# Patient Record
Sex: Female | Born: 1965 | Race: White | Hispanic: No | Marital: Single | Smoking: Current every day smoker
Health system: Southern US, Community
[De-identification: ages and names within clinical notes are randomized; demographics above are authoritative.]

## PROBLEM LIST (undated history)

## (undated) ENCOUNTER — Emergency Department (HOSPITAL_COMMUNITY): Source: Home / Self Care

## (undated) DIAGNOSIS — J449 Chronic obstructive pulmonary disease, unspecified: Secondary | ICD-10-CM

## (undated) DIAGNOSIS — T50905A Adverse effect of unspecified drugs, medicaments and biological substances, initial encounter: Secondary | ICD-10-CM

## (undated) DIAGNOSIS — Z8044 Family history of malignant neoplasm of fallopian tube(s): Secondary | ICD-10-CM

## (undated) DIAGNOSIS — Z8051 Family history of malignant neoplasm of kidney: Secondary | ICD-10-CM

## (undated) DIAGNOSIS — M549 Dorsalgia, unspecified: Secondary | ICD-10-CM

## (undated) DIAGNOSIS — K8021 Calculus of gallbladder without cholecystitis with obstruction: Secondary | ICD-10-CM

## (undated) DIAGNOSIS — F411 Generalized anxiety disorder: Secondary | ICD-10-CM

## (undated) DIAGNOSIS — I10 Essential (primary) hypertension: Secondary | ICD-10-CM

## (undated) DIAGNOSIS — G8929 Other chronic pain: Secondary | ICD-10-CM

## (undated) DIAGNOSIS — Z9981 Dependence on supplemental oxygen: Secondary | ICD-10-CM

## (undated) DIAGNOSIS — R0602 Shortness of breath: Secondary | ICD-10-CM

## (undated) DIAGNOSIS — G43909 Migraine, unspecified, not intractable, without status migrainosus: Secondary | ICD-10-CM

## (undated) DIAGNOSIS — Z801 Family history of malignant neoplasm of trachea, bronchus and lung: Secondary | ICD-10-CM

## (undated) DIAGNOSIS — J80 Acute respiratory distress syndrome: Secondary | ICD-10-CM

## (undated) DIAGNOSIS — K219 Gastro-esophageal reflux disease without esophagitis: Secondary | ICD-10-CM

## (undated) DIAGNOSIS — J069 Acute upper respiratory infection, unspecified: Secondary | ICD-10-CM

## (undated) DIAGNOSIS — I209 Angina pectoris, unspecified: Secondary | ICD-10-CM

## (undated) DIAGNOSIS — J189 Pneumonia, unspecified organism: Secondary | ICD-10-CM

## (undated) DIAGNOSIS — R739 Hyperglycemia, unspecified: Secondary | ICD-10-CM

## (undated) DIAGNOSIS — Z923 Personal history of irradiation: Secondary | ICD-10-CM

## (undated) HISTORY — DX: Family history of malignant neoplasm of trachea, bronchus and lung: Z80.1

## (undated) HISTORY — DX: Family history of malignant neoplasm of kidney: Z80.51

## (undated) HISTORY — PX: TUBAL LIGATION: SHX77

## (undated) HISTORY — PX: OTHER SURGICAL HISTORY: SHX169

## (undated) HISTORY — DX: Family history of malignant neoplasm of fallopian tube(s): Z80.44

## (undated) HISTORY — DX: Gastro-esophageal reflux disease without esophagitis: K21.9

## (undated) HISTORY — PX: TRACHEOSTOMY: SUR1362

---

## 2004-03-14 ENCOUNTER — Ambulatory Visit (HOSPITAL_COMMUNITY): Admission: RE | Admit: 2004-03-14 | Discharge: 2004-03-14 | Payer: Self-pay | Admitting: Family Medicine

## 2004-04-10 ENCOUNTER — Ambulatory Visit (HOSPITAL_COMMUNITY): Admission: RE | Admit: 2004-04-10 | Discharge: 2004-04-10 | Payer: Self-pay | Admitting: Family Medicine

## 2004-04-25 ENCOUNTER — Encounter (HOSPITAL_COMMUNITY): Admission: RE | Admit: 2004-04-25 | Discharge: 2004-05-25 | Payer: Self-pay | Admitting: Family Medicine

## 2004-05-29 DIAGNOSIS — M542 Cervicalgia: Secondary | ICD-10-CM | POA: Insufficient documentation

## 2005-04-19 IMAGING — CT CT HEAD W/O CM
1 series · 16 of 26 positions shown, 20 images · non-contrast
Comparison: none

CLINICAL DATA: Chronic headache almost daily for four months.  Nausea.  Dizziness. 
 CT SCAN OF THE HEAD WITHOUT CONTRAST BUT WITH BONE WINDOWS 
 A series of scans of the entire head are made without contrast and without previous films for comparison and show no evidence of intracranial mass or hemorrhage.  The ventricular system is normal.  There is no shift of the midline structures.  Bone windows show the paranasal sinuses, base of the skull, internal auditory canals and the bony calvarium to be intact. 
 IMPRESSION
 No significant abnormality CT scan of the head without contrast.

[Series 1505: — · axial · 0.45mm/px · z∈[-626,-511]mm · 16 of 26 slices shown, 20 images]
[im 2/26  brain]
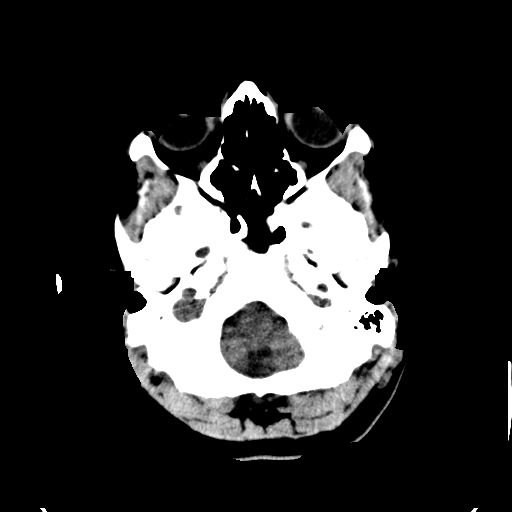
[im 2/26  bone]
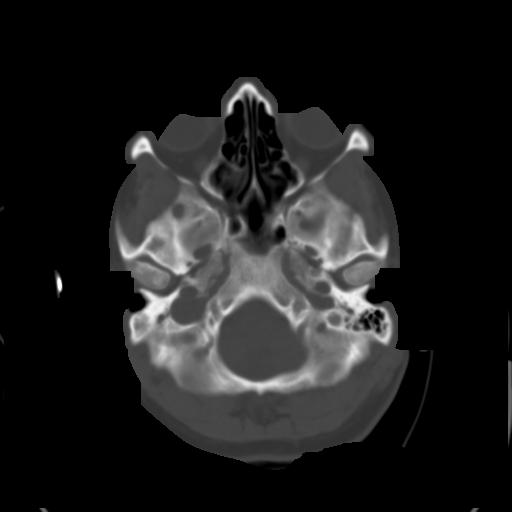
[im 4/26  brain]
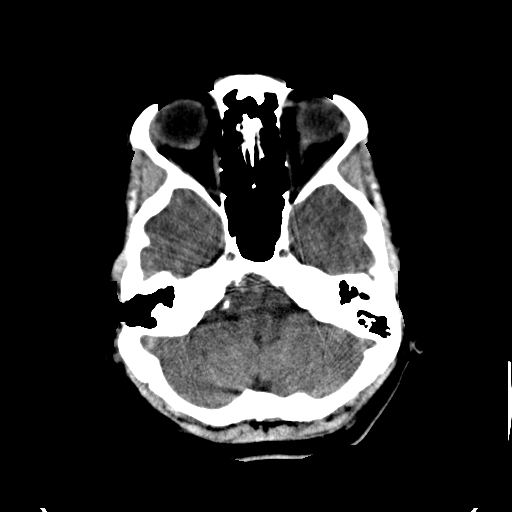
[im 5/26  brain]
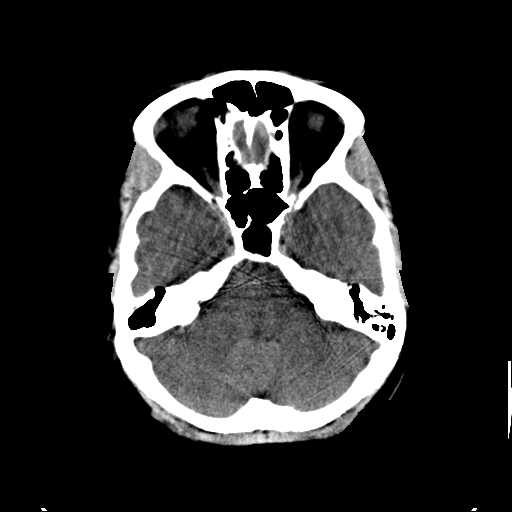
[im 7/26  brain]
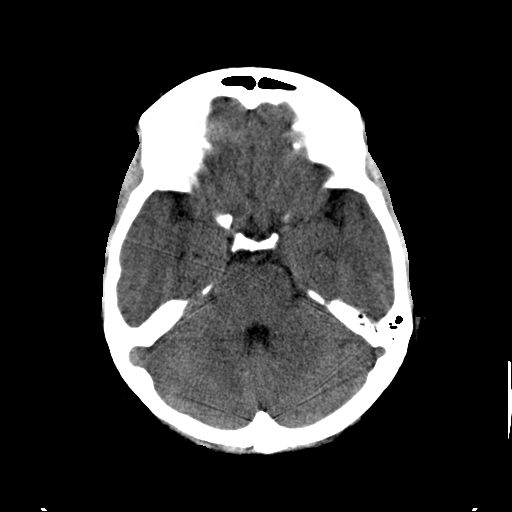
[im 8/26  brain]
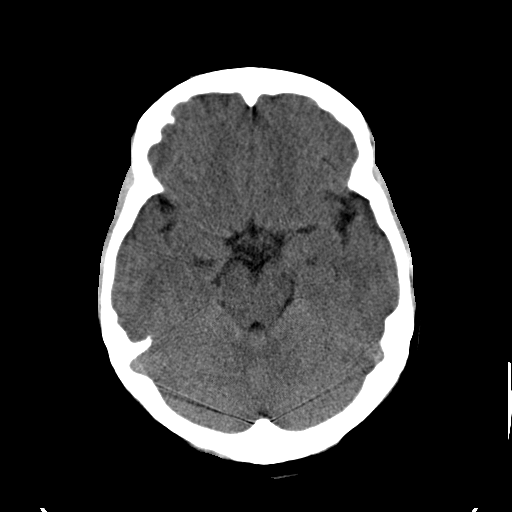
[im 8/26  bone]
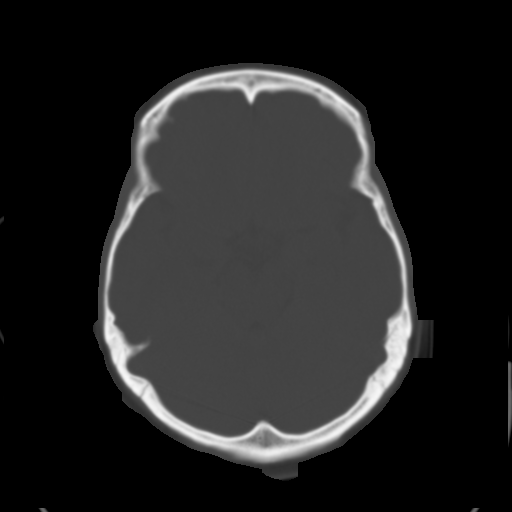
[im 10/26  brain]
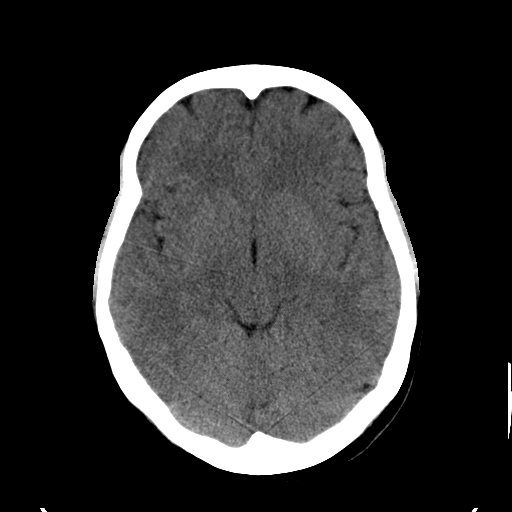
[im 11/26  brain]
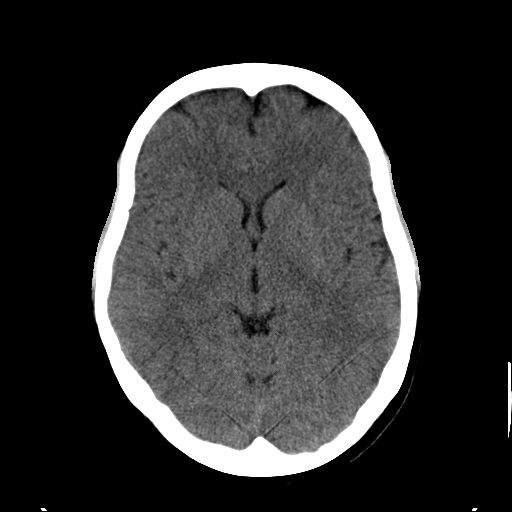
[im 13/26  brain]
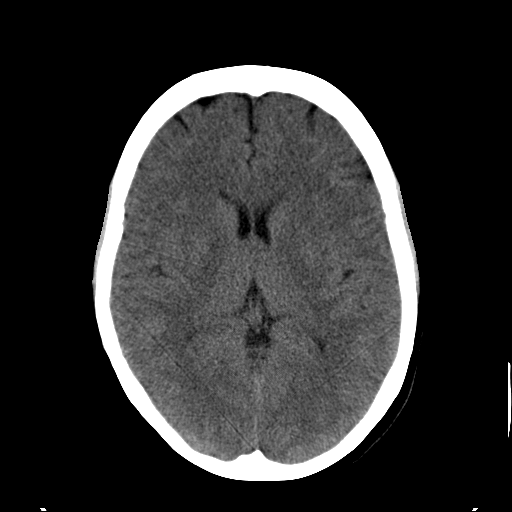
[im 14/26  brain]
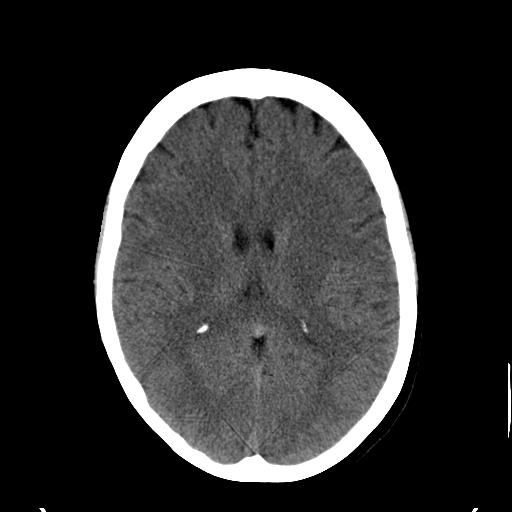
[im 14/26  bone]
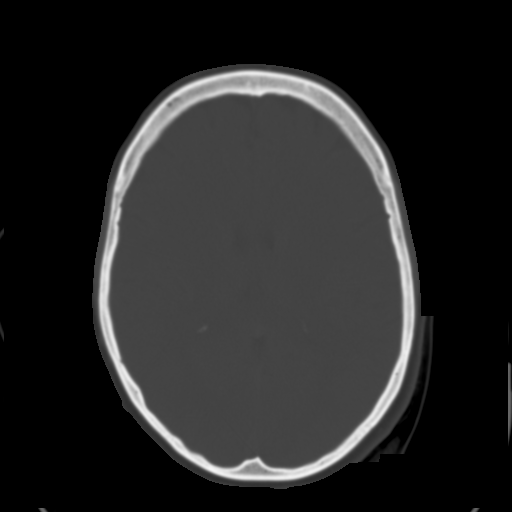
[im 16/26  brain]
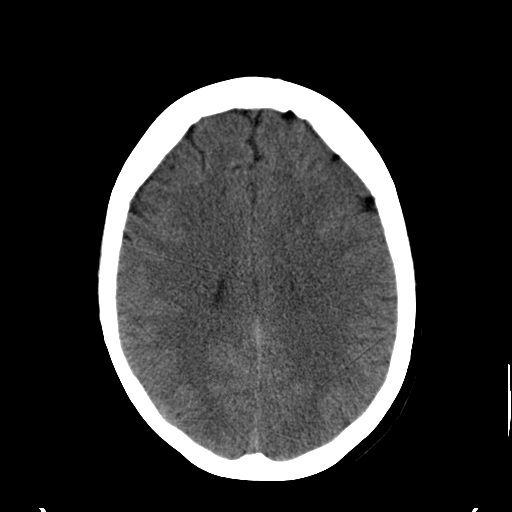
[im 17/26  brain]
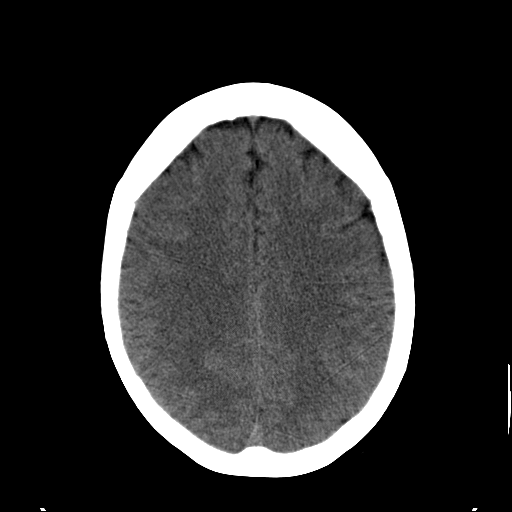
[im 19/26  brain]
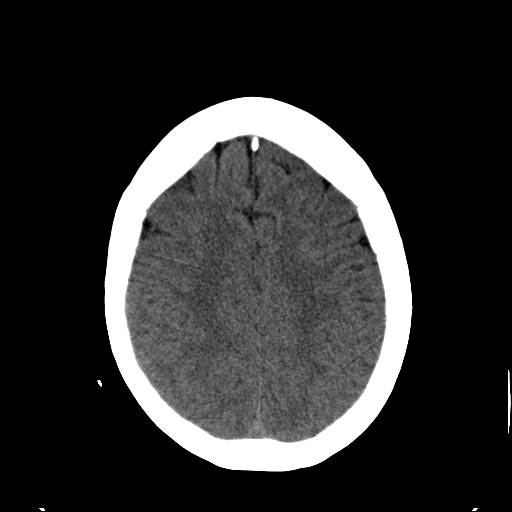
[im 20/26  brain]
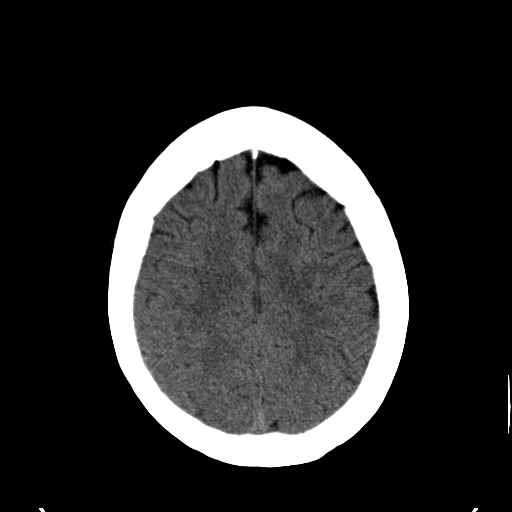
[im 20/26  bone]
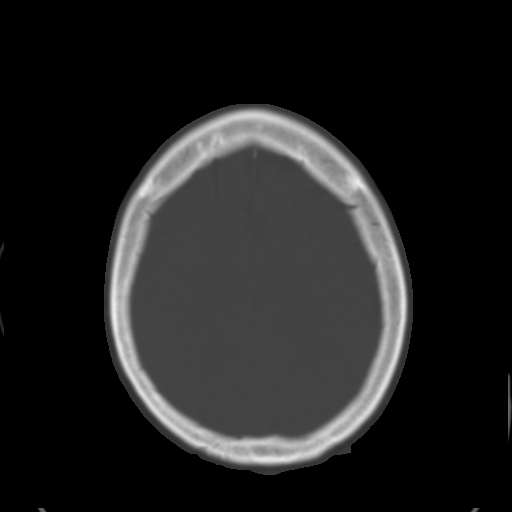
[im 22/26  brain]
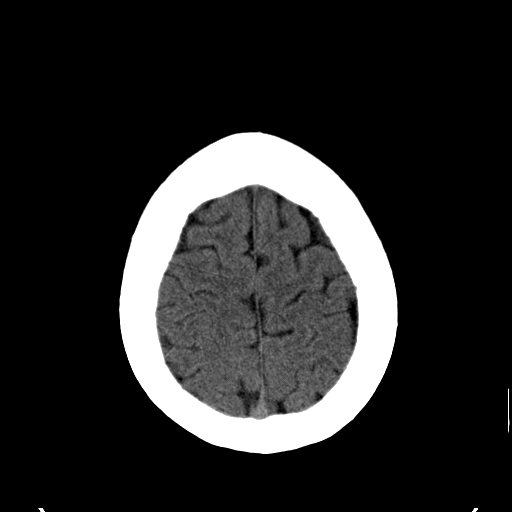
[im 23/26  brain]
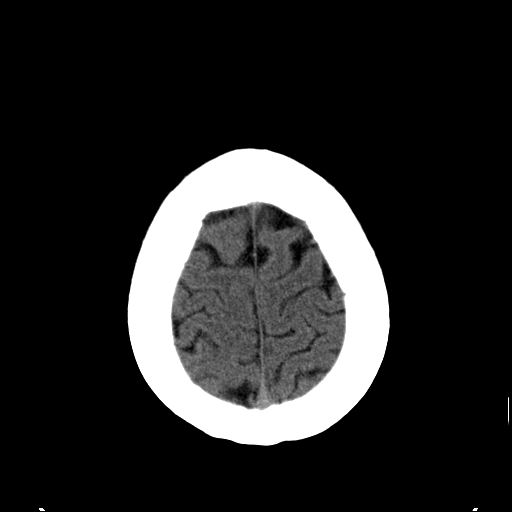
[im 25/26  brain]
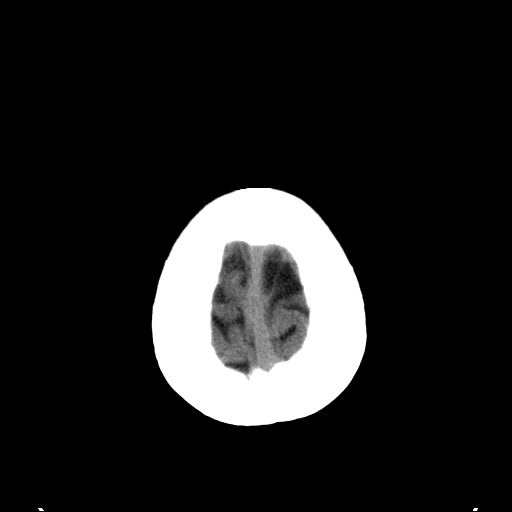

[16 of 26 positions shown; findings below may reference images not displayed]

## 2005-05-16 IMAGING — MR MR LUMBAR SPINE W/O CM
4 of 6 series · 13 of 48 positions shown · non-contrast
Comparison: none

CLINICAL DATA: Low back and bilateral leg pain, greater on the left.  The patient also has leg weakness and paresthesias.  
 MRI LUMBAR SPINE WITHOUT CONTRAST
 IMPRESSION
 Disk degeneration and moderate sized central disk herniation at the L4-5 level.  This is causing an anterior indentation on the thecal sac and is in contact with the right L5 nerve root as it begins to exit the thecal sac.  No nerve root compression seen in the supine position.

[Series 3: T2 · sagittal · 4.0mm · 0.67mm/px · 4 of 12 slices shown (1 of 2)]
[im 1/12]
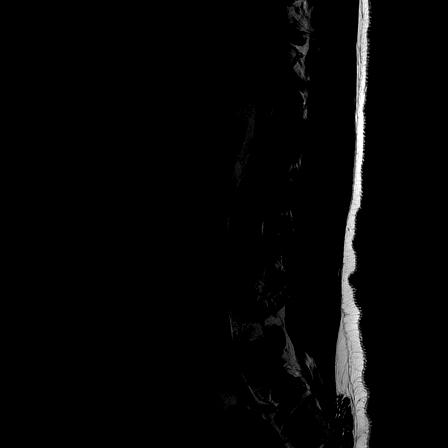
[im 3/12]
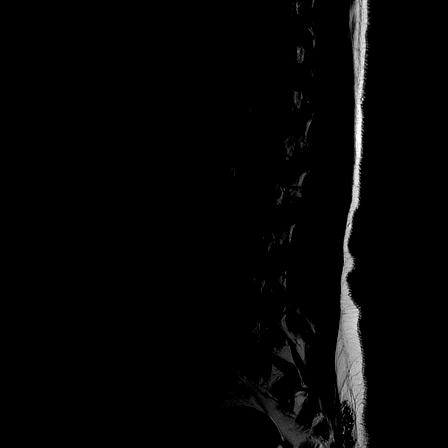
[im 7/12]
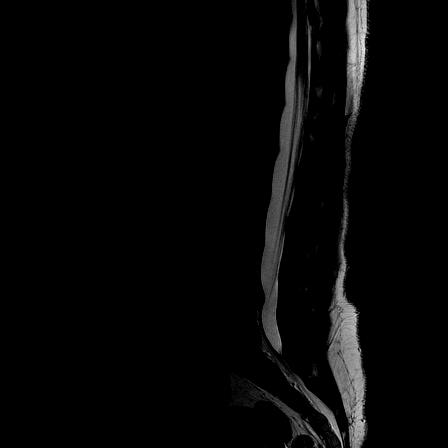
[im 12/12]
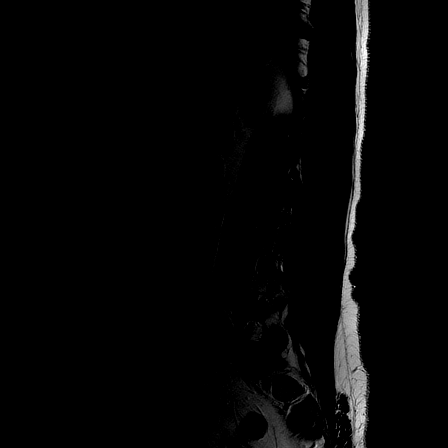

[Series 4: T1 · sagittal · 4.0mm · 0.39mm/px · 3 of 12 slices shown]
[im 3/12]
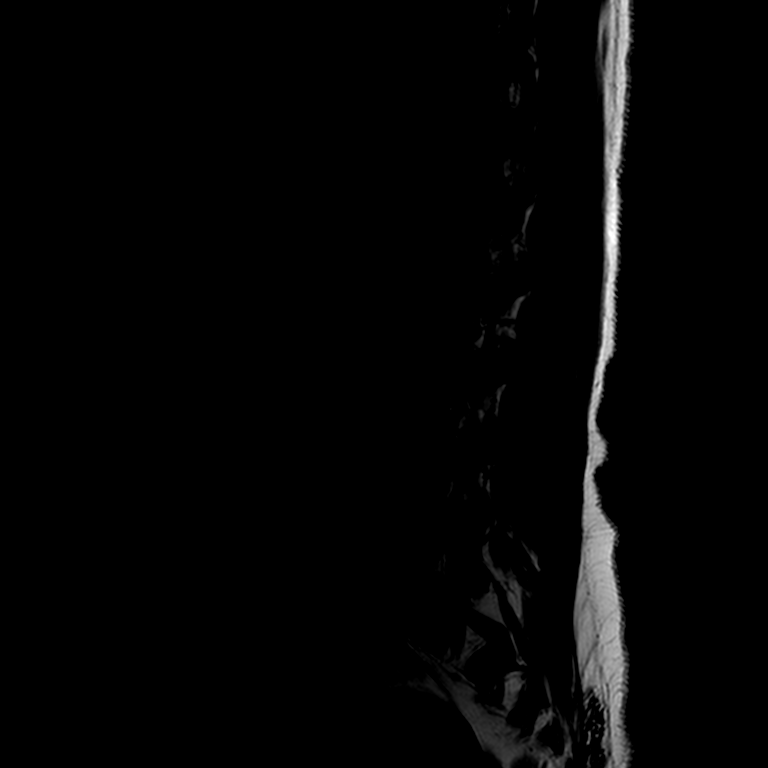
[im 7/12]
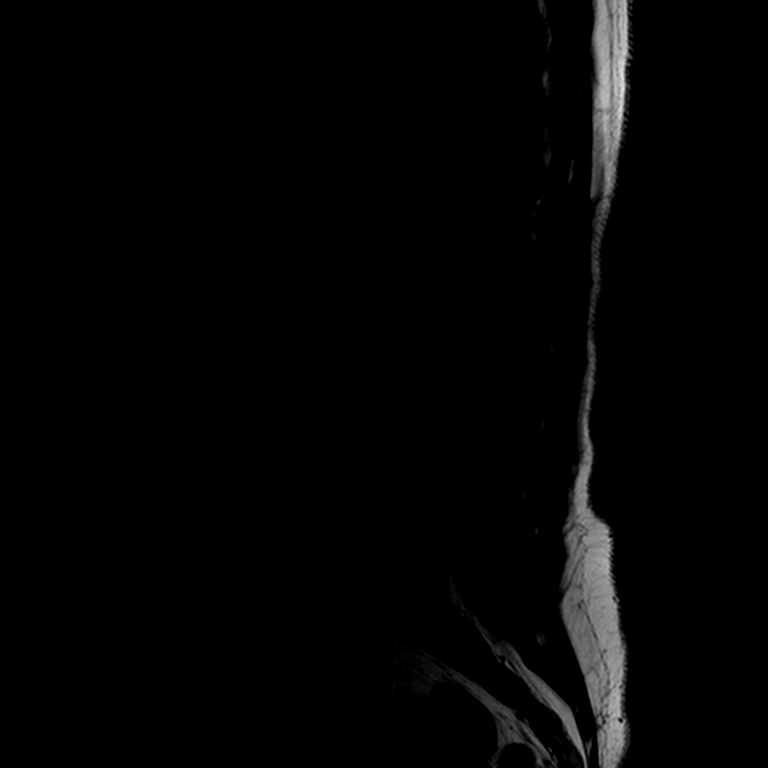
[im 12/12]
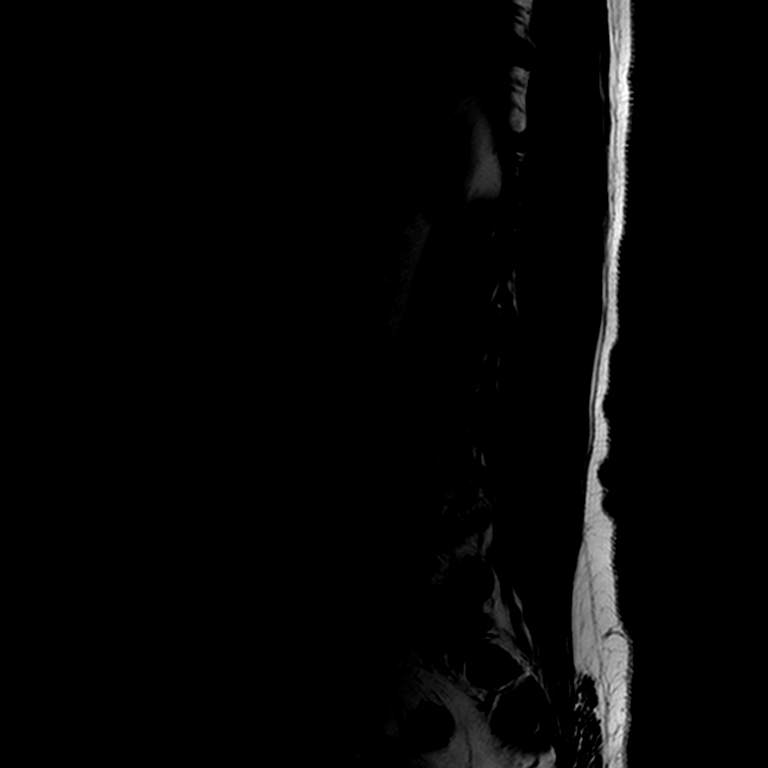

[Series 5: PD · sagittal · 4.0mm · 0.42mm/px · 3 of 12 slices shown]
[im 3/12]
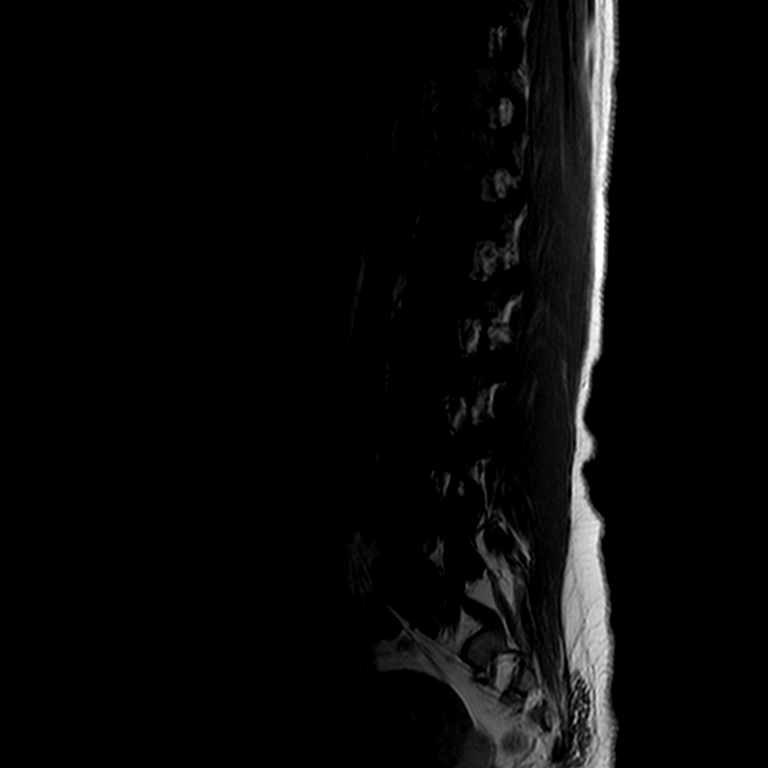
[im 7/12]
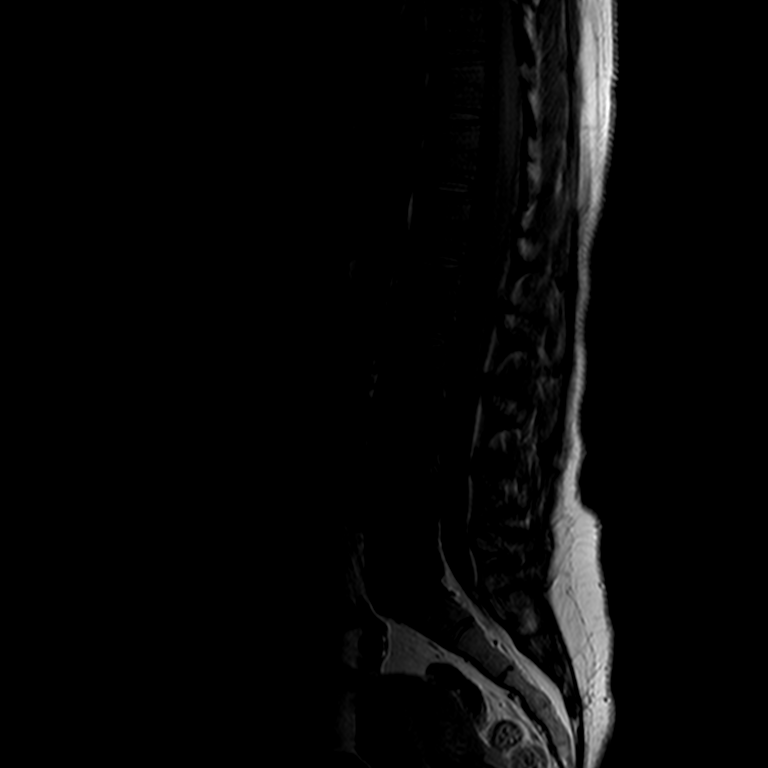
[im 12/12]
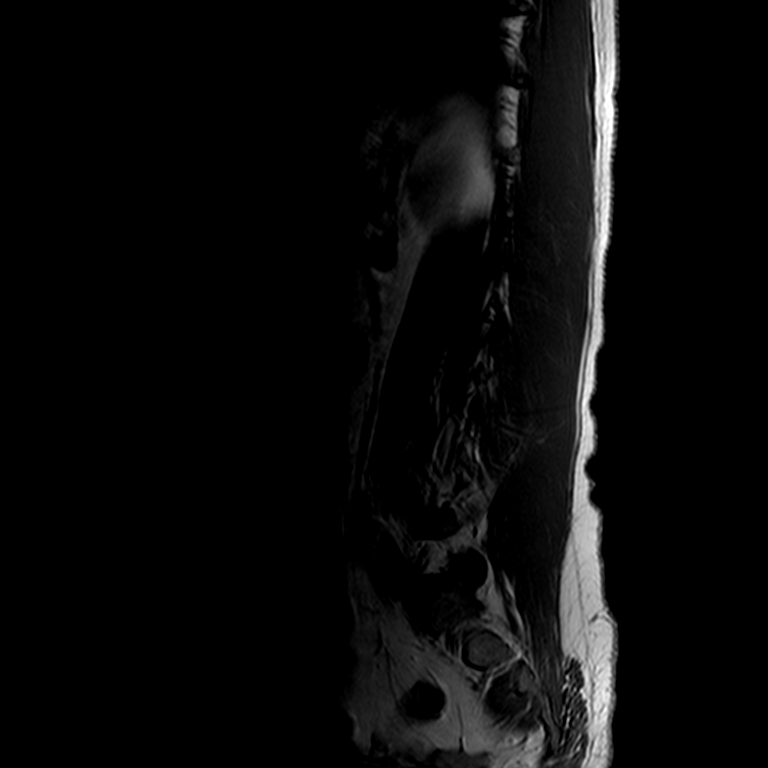

[Series 7: T2 · axial · 4.0mm · 0.21mm/px · z∈[-111,+23]mm · 3 of 25 slices shown (2 of 2)]
[im 5/25]
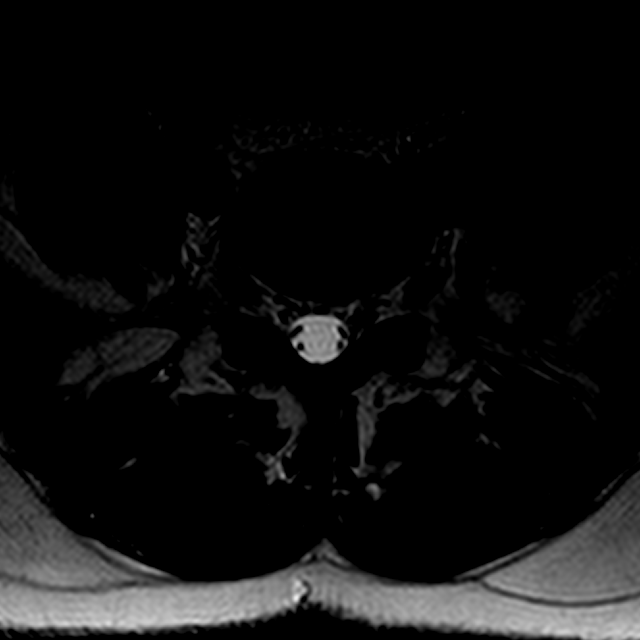
[im 14/25]
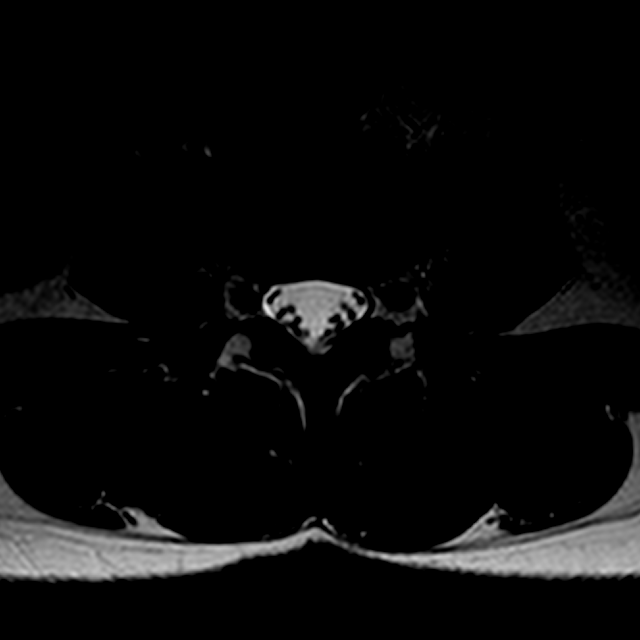
[im 22/25]
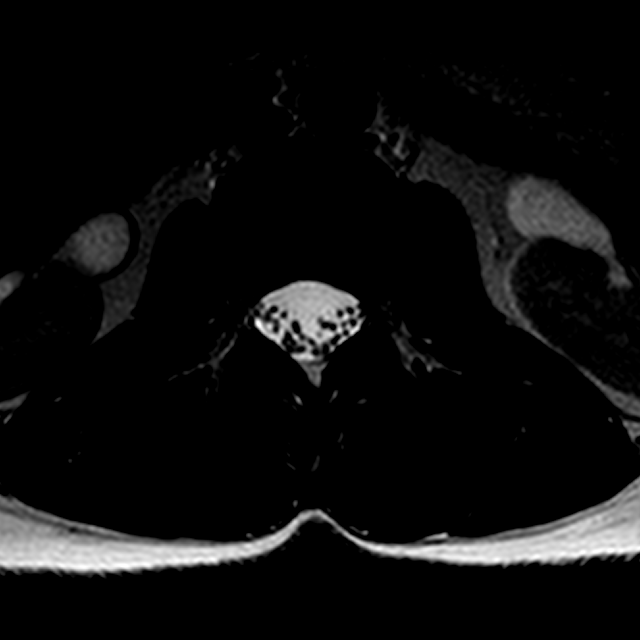

[13 of 48 positions shown; findings below may reference images not displayed]

## 2006-06-04 ENCOUNTER — Emergency Department (HOSPITAL_COMMUNITY): Admission: EM | Admit: 2006-06-04 | Discharge: 2006-06-04 | Payer: Self-pay | Admitting: Emergency Medicine

## 2006-06-13 ENCOUNTER — Emergency Department (HOSPITAL_COMMUNITY): Admission: EM | Admit: 2006-06-13 | Discharge: 2006-06-13 | Payer: Self-pay | Admitting: Emergency Medicine

## 2006-07-19 ENCOUNTER — Emergency Department (HOSPITAL_COMMUNITY): Admission: EM | Admit: 2006-07-19 | Discharge: 2006-07-19 | Payer: Self-pay | Admitting: Emergency Medicine

## 2006-07-25 ENCOUNTER — Emergency Department (HOSPITAL_COMMUNITY): Admission: EM | Admit: 2006-07-25 | Discharge: 2006-07-25 | Payer: Self-pay | Admitting: Emergency Medicine

## 2007-08-30 IMAGING — CT CT ABDOMEN W/O CM
2 of 4 series · 16 of 46 positions shown, 18 images · IV contrast (agent unspecified)
Comparison: none

CLINICAL DATA: Abdominal pain, evaluate for stone.
ABDOMEN CT WITHOUT CONTRAST:
TECHNIQUE: Multidetector CT imaging of the abdomen was performed following the standard protocol without IV contrast.
TECHNIQUE: Multidetector CT imaging of the pelvis was performed following the standard protocol without IV contrast.

[Series 2: stone_wo 5.0 b40f st · axial · 0.61mm/px · z∈[-448,-112]mm · 13 of 92 slices shown, 15 images]
[im 4/92  soft-tissue]
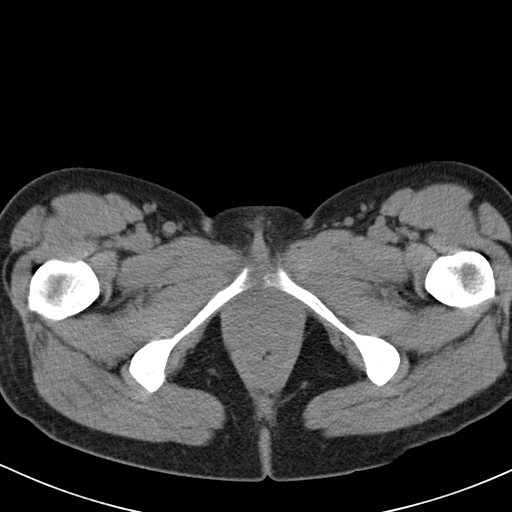
[im 4/92  bone]
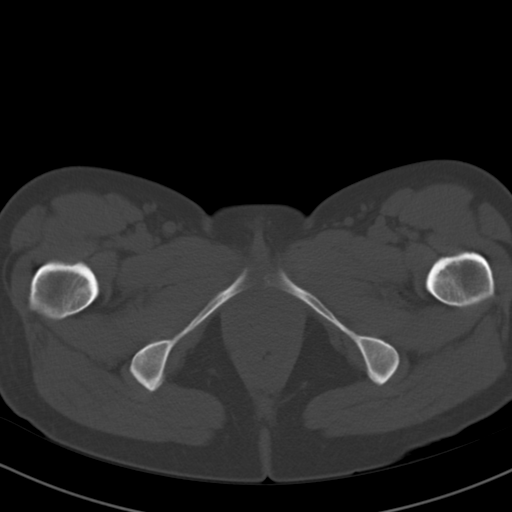
[im 11/92  soft-tissue]
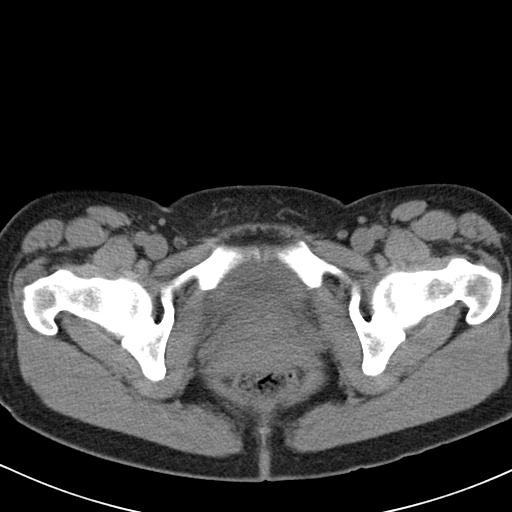
[im 19/92  soft-tissue]
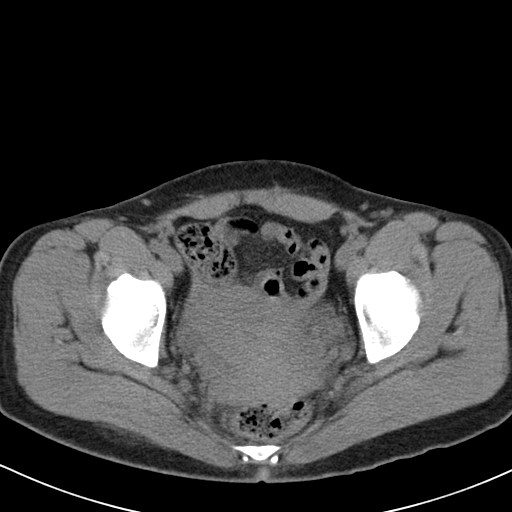
[im 26/92  soft-tissue]
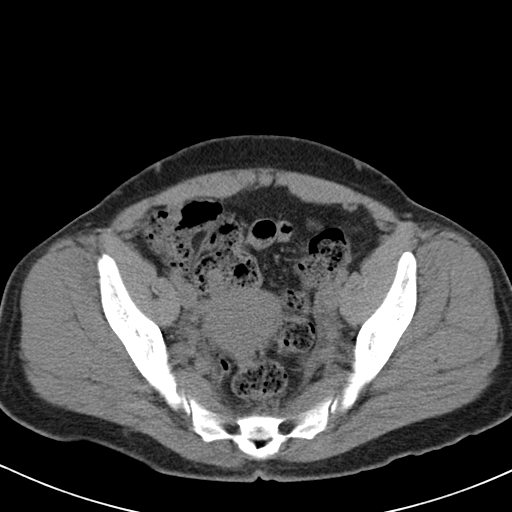
[im 33/92  soft-tissue]
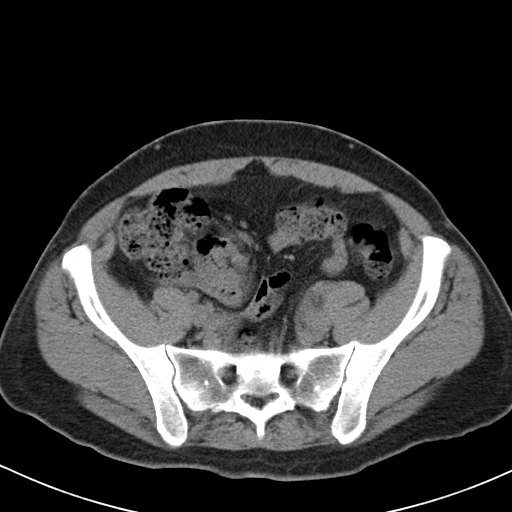
[im 41/92  soft-tissue]
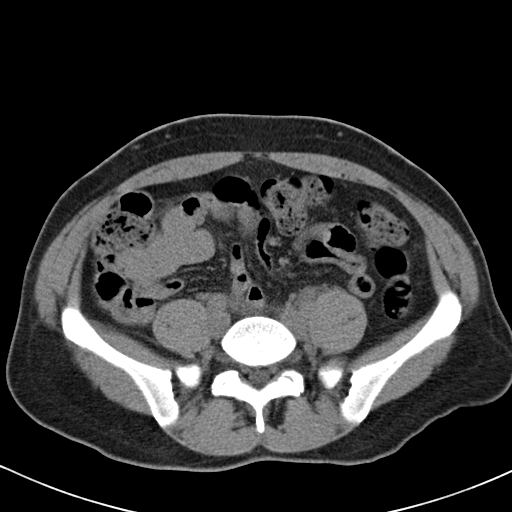
[im 48/92  soft-tissue]
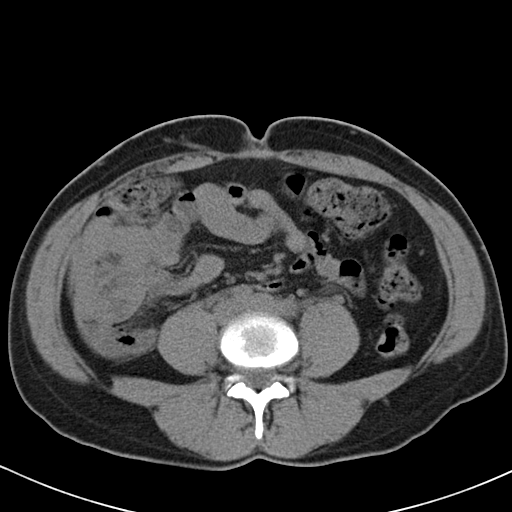
[im 51/92  soft-tissue]
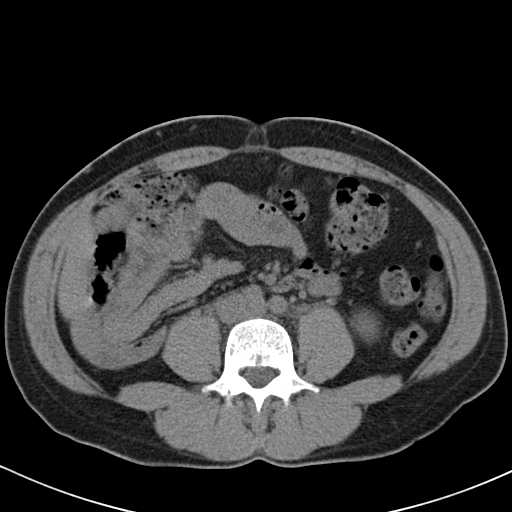
[im 59/92  soft-tissue]
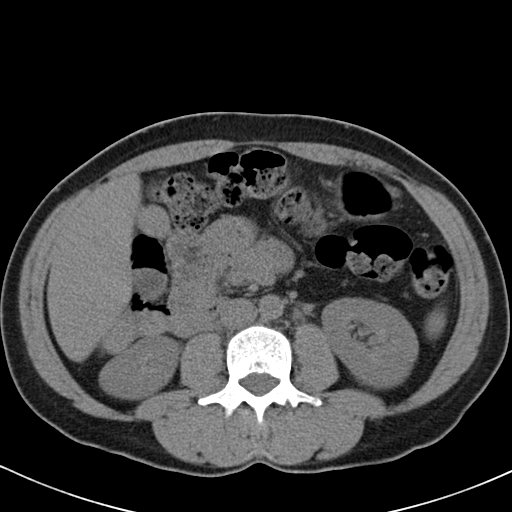
[im 59/92  bone]
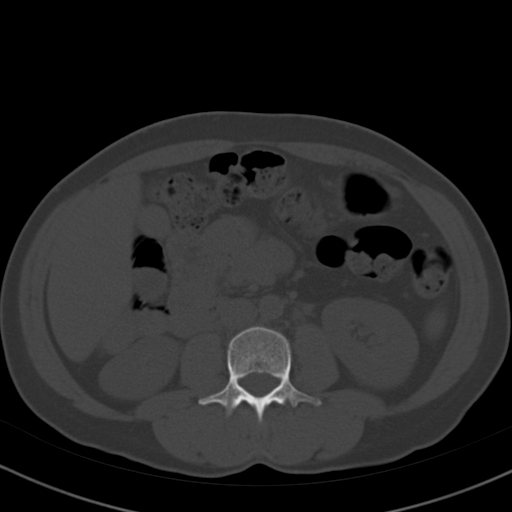
[im 66/92  soft-tissue]
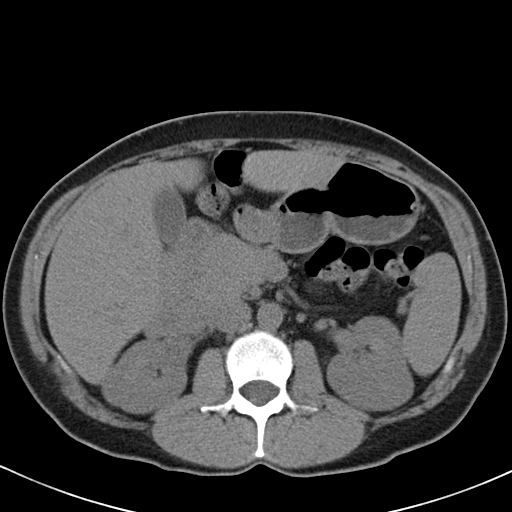
[im 73/92  soft-tissue]
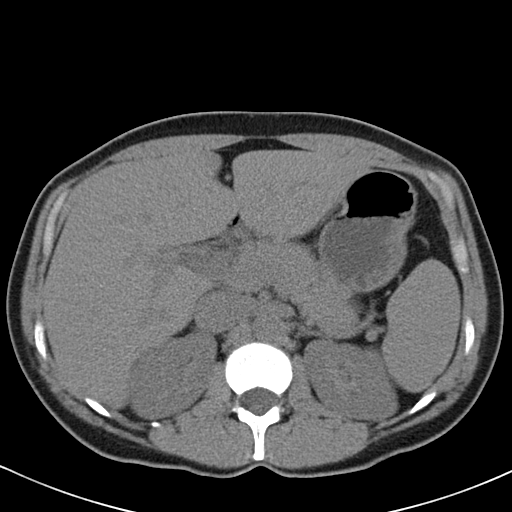
[im 81/92  soft-tissue]
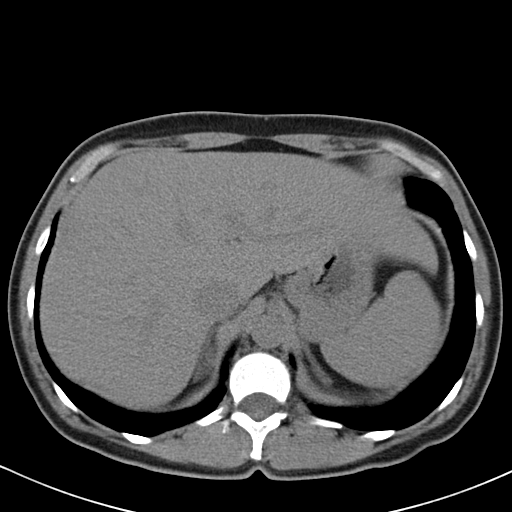
[im 88/92  soft-tissue]
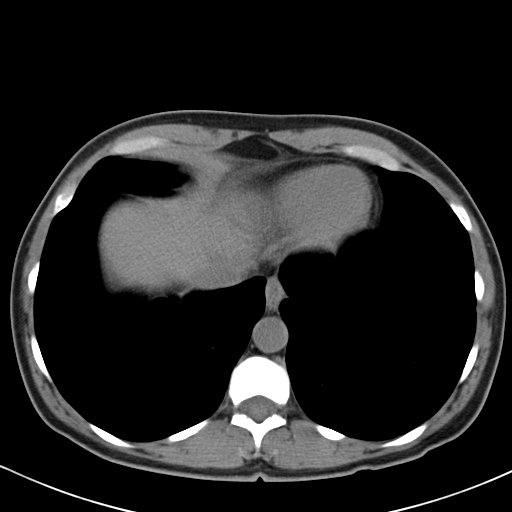

[Series 602: coronal abdomen · coronal · 0.75mm/px · 3 of 108 slices shown]
[im 36/108  soft-tissue]
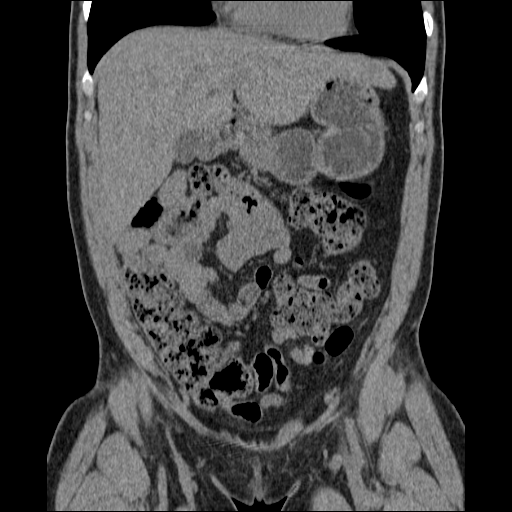
[im 48/108  soft-tissue]
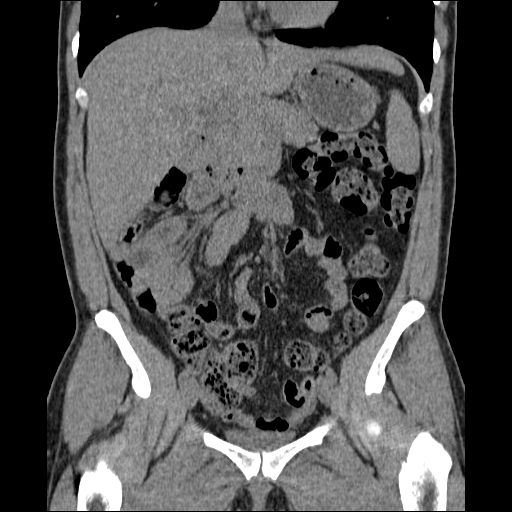
[im 60/108  soft-tissue]
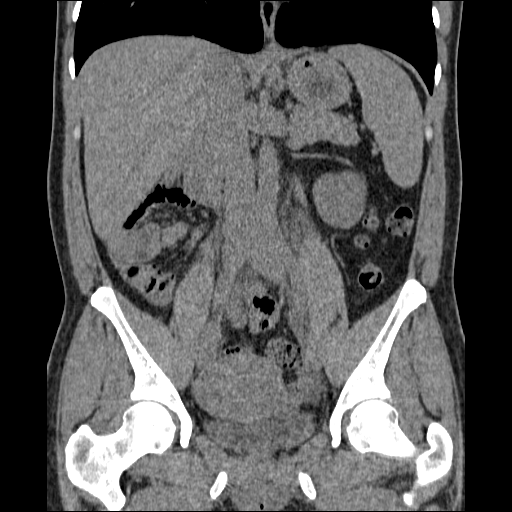

[16 of 46 positions shown; findings below may reference images not displayed]

FINDINGS: Evaluation of abdominal structures in inherently limited without IV contrast.    There is a 4 to 5 mm subpleural density along the left costophrenic angle on image 14 that likely represents a scar or subpleural lymph node.  There is an additional punctate pleural peripheral density on image 5 in the left lower lobe that may be calcified.  The remainder of the lung bases are clear.  Negative for free air.  Liver, spleen, gallbladder, and stomach are within normal limits.  Soft tissue near the porta hepatis and pancreatic head is indeterminate but probably represents pancreas and adjacent bowel structures.   is difficult to evaluate without intravenous or oral contrast.  The adrenal glands and right kidney are within normal limits.  Mild dilatation of the left ureter with surrounding stranding along the course of the ureter.  However, there is not a ureteral stone or obstructing lesion appreciated.  There appears to be extensive inflammatory change where the left iliac vessels and left ureter cross.  There appears to be a small amount of free fluid which could be physiologic in a patient of this age.  No acute bony abnormalities.
IMPRESSION: Stranding around the left ureter.  No evidence for an obstructing stone.  Findings could represent an underlying mucosal lesion versus a recently passed stone.  This area may be better evaluated with contrast enhanced study or IVP.  
PELVIS CT WITHOUT CONTRAST:
FINDINGS: The urinary bladder, uterus, and adnexal structures are grossly normal.  A small amount of free fluid is likely physiologic in nature.  No acute bony abnormalities.
IMPRESSION: 1.  Small amount of free fluid that is indeterminate but could be physiologic in nature.
2.  No evidence for ureteral or bladder stones.

## 2008-02-13 ENCOUNTER — Inpatient Hospital Stay (HOSPITAL_COMMUNITY): Admission: AD | Admit: 2008-02-13 | Discharge: 2008-02-17 | Payer: Self-pay | Admitting: Psychiatry

## 2008-02-13 ENCOUNTER — Ambulatory Visit: Payer: Self-pay | Admitting: Psychiatry

## 2009-02-24 ENCOUNTER — Emergency Department (HOSPITAL_COMMUNITY): Admission: EM | Admit: 2009-02-24 | Discharge: 2009-02-24 | Payer: Self-pay | Admitting: Emergency Medicine

## 2009-07-27 ENCOUNTER — Inpatient Hospital Stay (HOSPITAL_COMMUNITY): Admission: EM | Admit: 2009-07-27 | Discharge: 2009-07-29 | Payer: Self-pay | Admitting: Emergency Medicine

## 2009-08-03 ENCOUNTER — Inpatient Hospital Stay (HOSPITAL_COMMUNITY): Admission: EM | Admit: 2009-08-03 | Discharge: 2009-08-05 | Payer: Self-pay | Admitting: Emergency Medicine

## 2009-08-10 ENCOUNTER — Emergency Department (HOSPITAL_COMMUNITY): Admission: EM | Admit: 2009-08-10 | Discharge: 2009-08-10 | Payer: Self-pay | Admitting: Emergency Medicine

## 2009-08-12 ENCOUNTER — Emergency Department (HOSPITAL_COMMUNITY): Admission: EM | Admit: 2009-08-12 | Discharge: 2009-08-12 | Payer: Self-pay | Admitting: Emergency Medicine

## 2009-11-26 ENCOUNTER — Ambulatory Visit: Payer: Self-pay | Admitting: Critical Care Medicine

## 2009-11-26 ENCOUNTER — Ambulatory Visit: Payer: Self-pay | Admitting: Internal Medicine

## 2009-11-26 ENCOUNTER — Inpatient Hospital Stay (HOSPITAL_COMMUNITY): Admission: EM | Admit: 2009-11-26 | Discharge: 2010-01-15 | Payer: Self-pay | Admitting: Emergency Medicine

## 2009-11-28 ENCOUNTER — Ambulatory Visit: Payer: Self-pay | Admitting: Infectious Diseases

## 2009-11-28 ENCOUNTER — Encounter: Payer: Self-pay | Admitting: Critical Care Medicine

## 2009-12-24 ENCOUNTER — Ambulatory Visit: Payer: Self-pay | Admitting: Critical Care Medicine

## 2010-01-03 ENCOUNTER — Ambulatory Visit: Payer: Self-pay | Admitting: Physical Medicine & Rehabilitation

## 2010-01-28 ENCOUNTER — Emergency Department (HOSPITAL_COMMUNITY): Admission: EM | Admit: 2010-01-28 | Discharge: 2010-01-28 | Payer: Self-pay | Admitting: Emergency Medicine

## 2010-02-09 ENCOUNTER — Emergency Department (HOSPITAL_COMMUNITY): Admission: EM | Admit: 2010-02-09 | Discharge: 2010-02-09 | Payer: Self-pay | Admitting: Emergency Medicine

## 2010-02-13 DIAGNOSIS — E639 Nutritional deficiency, unspecified: Secondary | ICD-10-CM | POA: Insufficient documentation

## 2010-02-13 DIAGNOSIS — J984 Other disorders of lung: Secondary | ICD-10-CM

## 2010-02-13 DIAGNOSIS — R404 Transient alteration of awareness: Secondary | ICD-10-CM | POA: Insufficient documentation

## 2010-02-14 ENCOUNTER — Ambulatory Visit: Payer: Self-pay | Admitting: Emergency Medicine

## 2010-02-14 DIAGNOSIS — F331 Major depressive disorder, recurrent, moderate: Secondary | ICD-10-CM | POA: Insufficient documentation

## 2010-02-14 DIAGNOSIS — F1910 Other psychoactive substance abuse, uncomplicated: Secondary | ICD-10-CM | POA: Insufficient documentation

## 2010-02-14 DIAGNOSIS — J449 Chronic obstructive pulmonary disease, unspecified: Secondary | ICD-10-CM | POA: Insufficient documentation

## 2010-02-20 ENCOUNTER — Telehealth: Payer: Self-pay | Admitting: Emergency Medicine

## 2010-02-25 ENCOUNTER — Telehealth (INDEPENDENT_AMBULATORY_CARE_PROVIDER_SITE_OTHER): Payer: Self-pay | Admitting: *Deleted

## 2010-04-01 IMAGING — CR DG CHEST 2V
2 series · 2 of 2 positions shown · non-contrast
Comparison: None available.

CLINICAL DATA: Shortness of breath.

CHEST - 2 VIEW

[w chest pa]
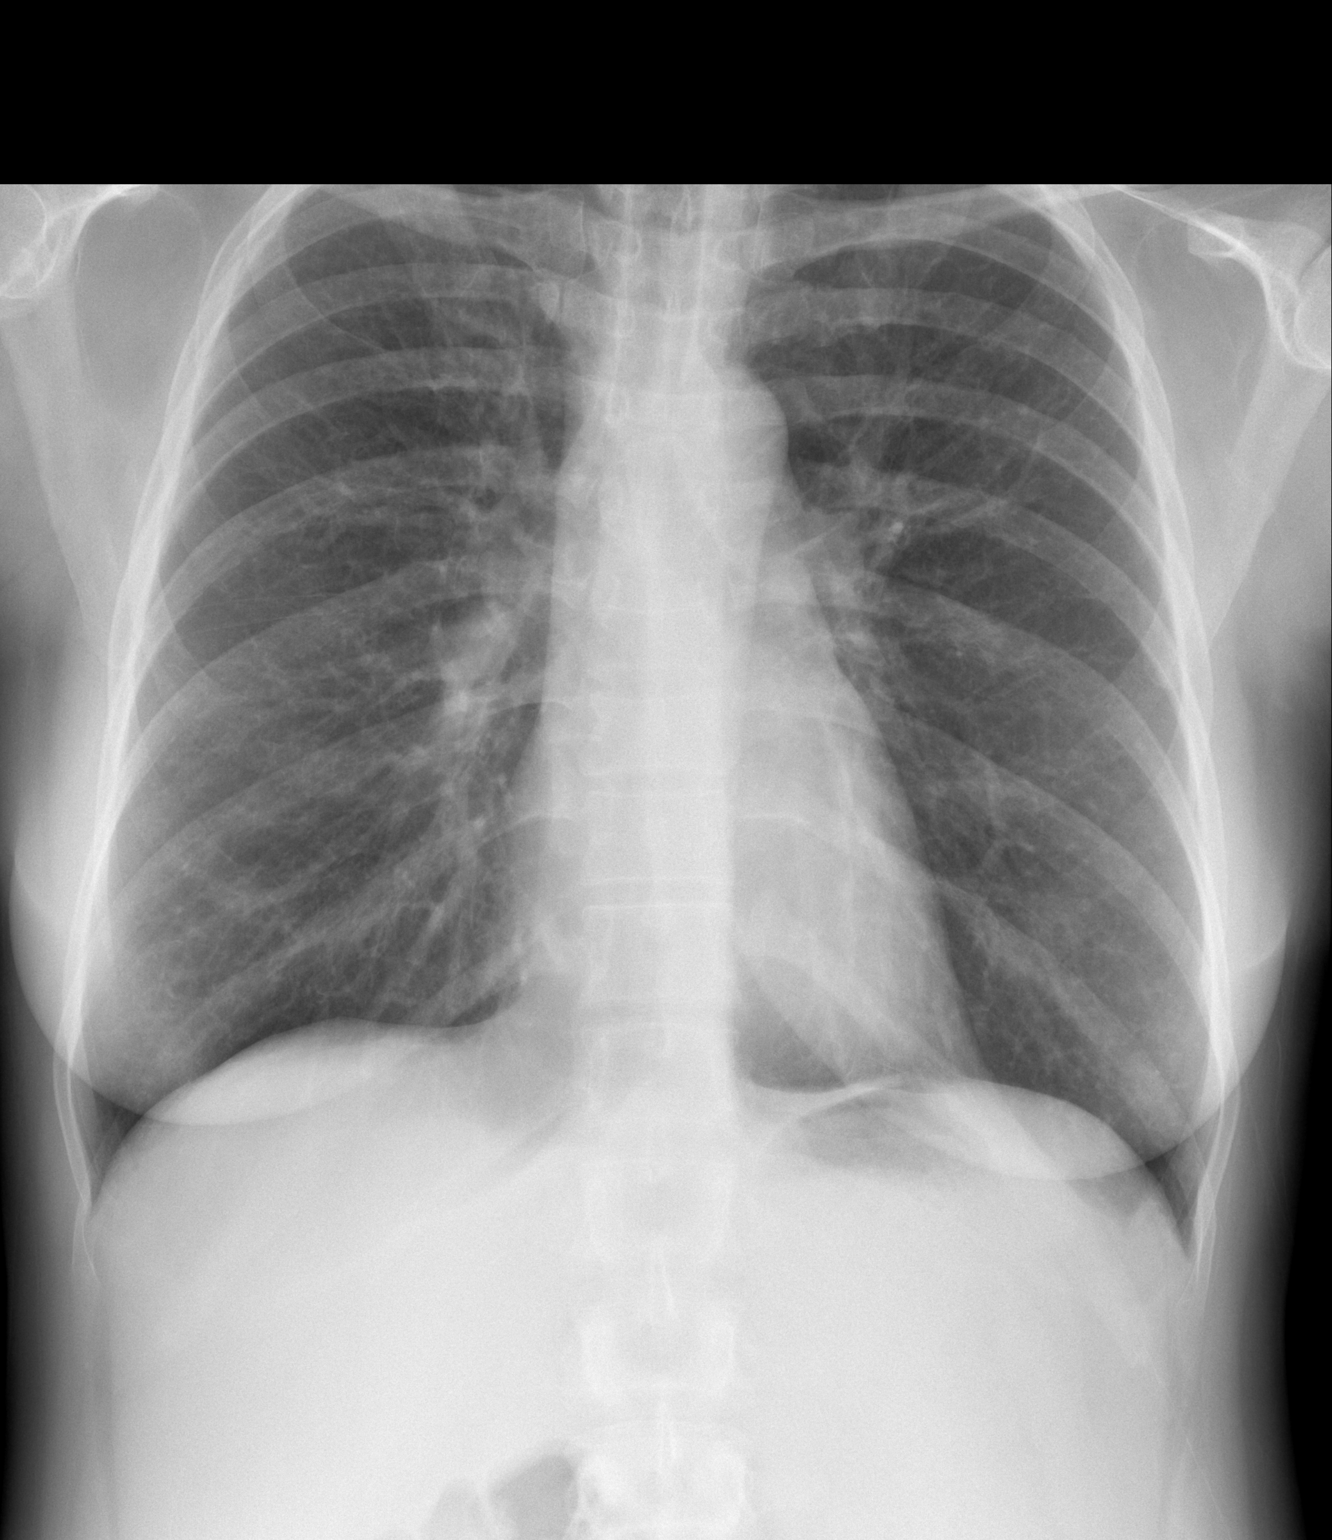

[w chest lat]
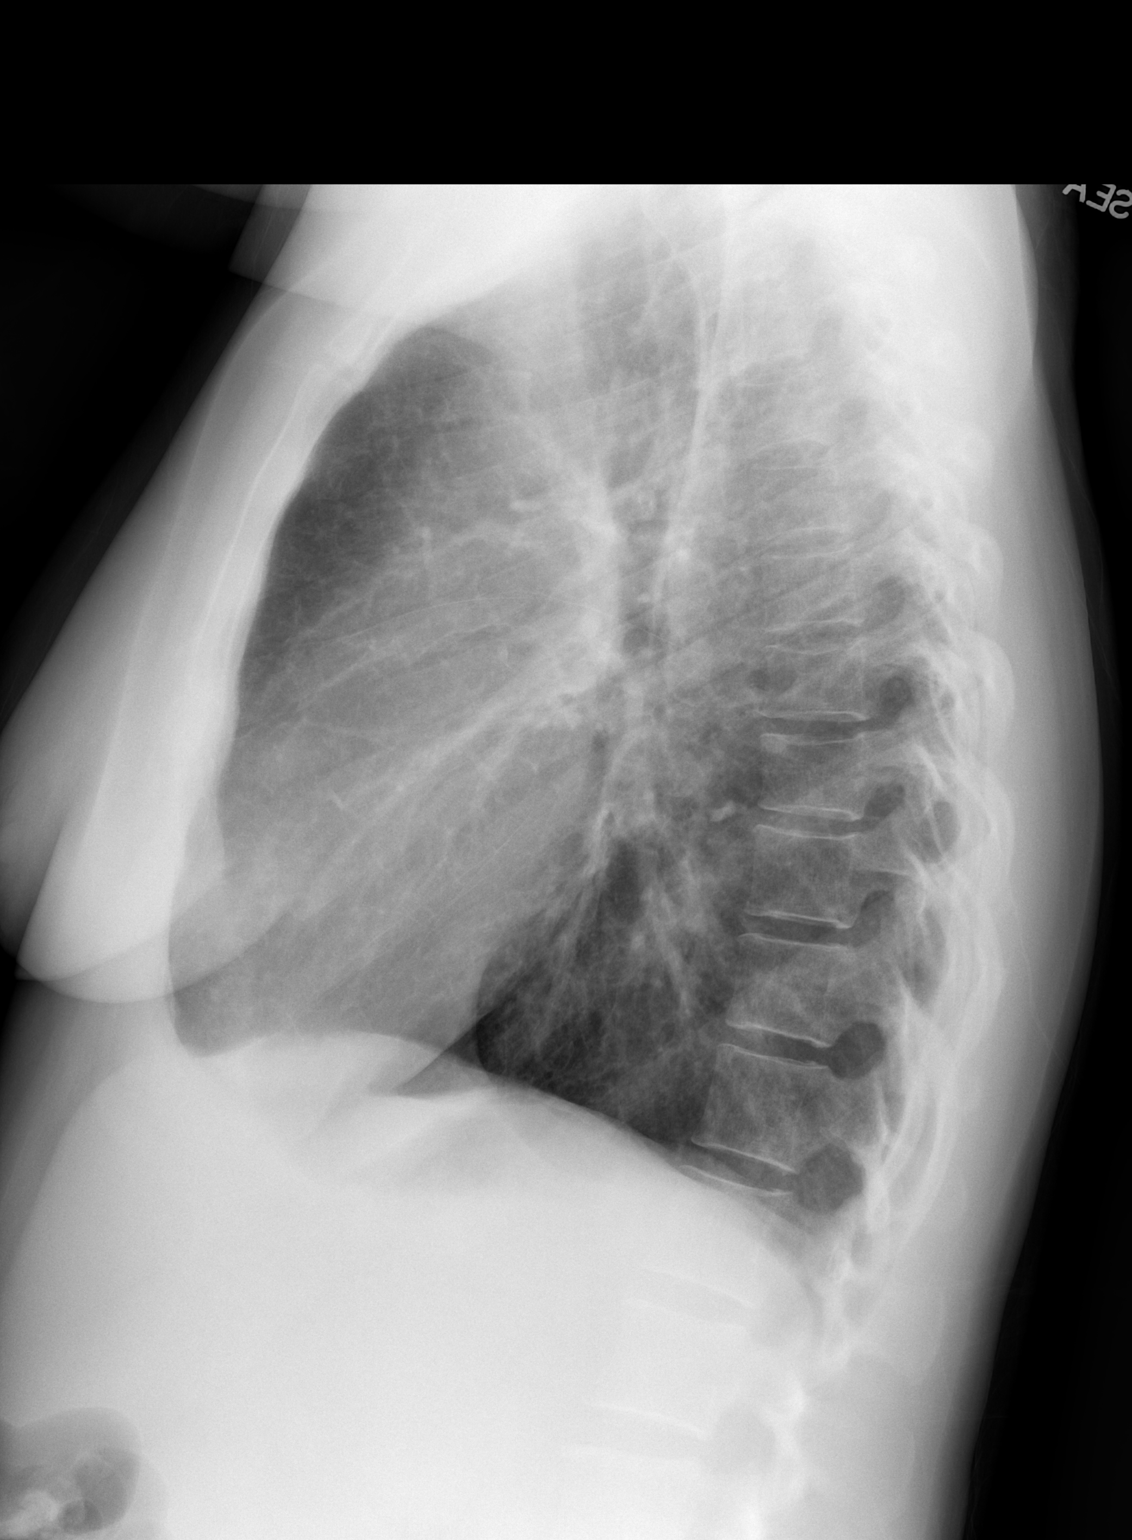

[2 of 2 positions shown; findings below may reference images not displayed]

FINDINGS: Lungs clear. Heart size normal. No pleural effusion.
IMPRESSION: No acute disease.

## 2010-04-11 ENCOUNTER — Ambulatory Visit (HOSPITAL_COMMUNITY): Admission: RE | Admit: 2010-04-11 | Discharge: 2010-04-11 | Payer: Self-pay | Admitting: Family Medicine

## 2010-04-16 ENCOUNTER — Ambulatory Visit: Payer: Self-pay | Admitting: Emergency Medicine

## 2010-05-01 ENCOUNTER — Emergency Department (HOSPITAL_COMMUNITY): Admission: EM | Admit: 2010-05-01 | Discharge: 2010-05-01 | Payer: Self-pay | Admitting: Emergency Medicine

## 2010-05-02 ENCOUNTER — Telehealth: Payer: Self-pay | Admitting: Emergency Medicine

## 2010-07-22 ENCOUNTER — Ambulatory Visit: Payer: Self-pay | Admitting: Emergency Medicine

## 2010-08-01 ENCOUNTER — Telehealth (INDEPENDENT_AMBULATORY_CARE_PROVIDER_SITE_OTHER): Payer: Self-pay | Admitting: *Deleted

## 2010-08-07 ENCOUNTER — Telehealth (INDEPENDENT_AMBULATORY_CARE_PROVIDER_SITE_OTHER): Payer: Self-pay | Admitting: *Deleted

## 2010-08-17 ENCOUNTER — Emergency Department (HOSPITAL_COMMUNITY): Admission: EM | Admit: 2010-08-17 | Discharge: 2010-08-17 | Payer: Self-pay | Admitting: Emergency Medicine

## 2010-08-21 ENCOUNTER — Ambulatory Visit (HOSPITAL_COMMUNITY): Admission: RE | Admit: 2010-08-21 | Discharge: 2010-08-21 | Payer: Self-pay | Admitting: Neurology

## 2010-08-23 ENCOUNTER — Telehealth: Payer: Self-pay | Admitting: Pulmonary Disease

## 2010-09-01 ENCOUNTER — Ambulatory Visit: Payer: Self-pay | Admitting: Emergency Medicine

## 2010-09-01 ENCOUNTER — Emergency Department (HOSPITAL_COMMUNITY): Admission: EM | Admit: 2010-09-01 | Discharge: 2010-09-01 | Payer: Self-pay | Admitting: Emergency Medicine

## 2010-09-01 IMAGING — CR DG CHEST 1V PORT
1 series · 1 of 1 positions shown · non-contrast
Comparison: 02/24/2009

CLINICAL DATA: Shortness of breath, chest pain, smoker

PORTABLE CHEST - 1 VIEW

[view not recorded]
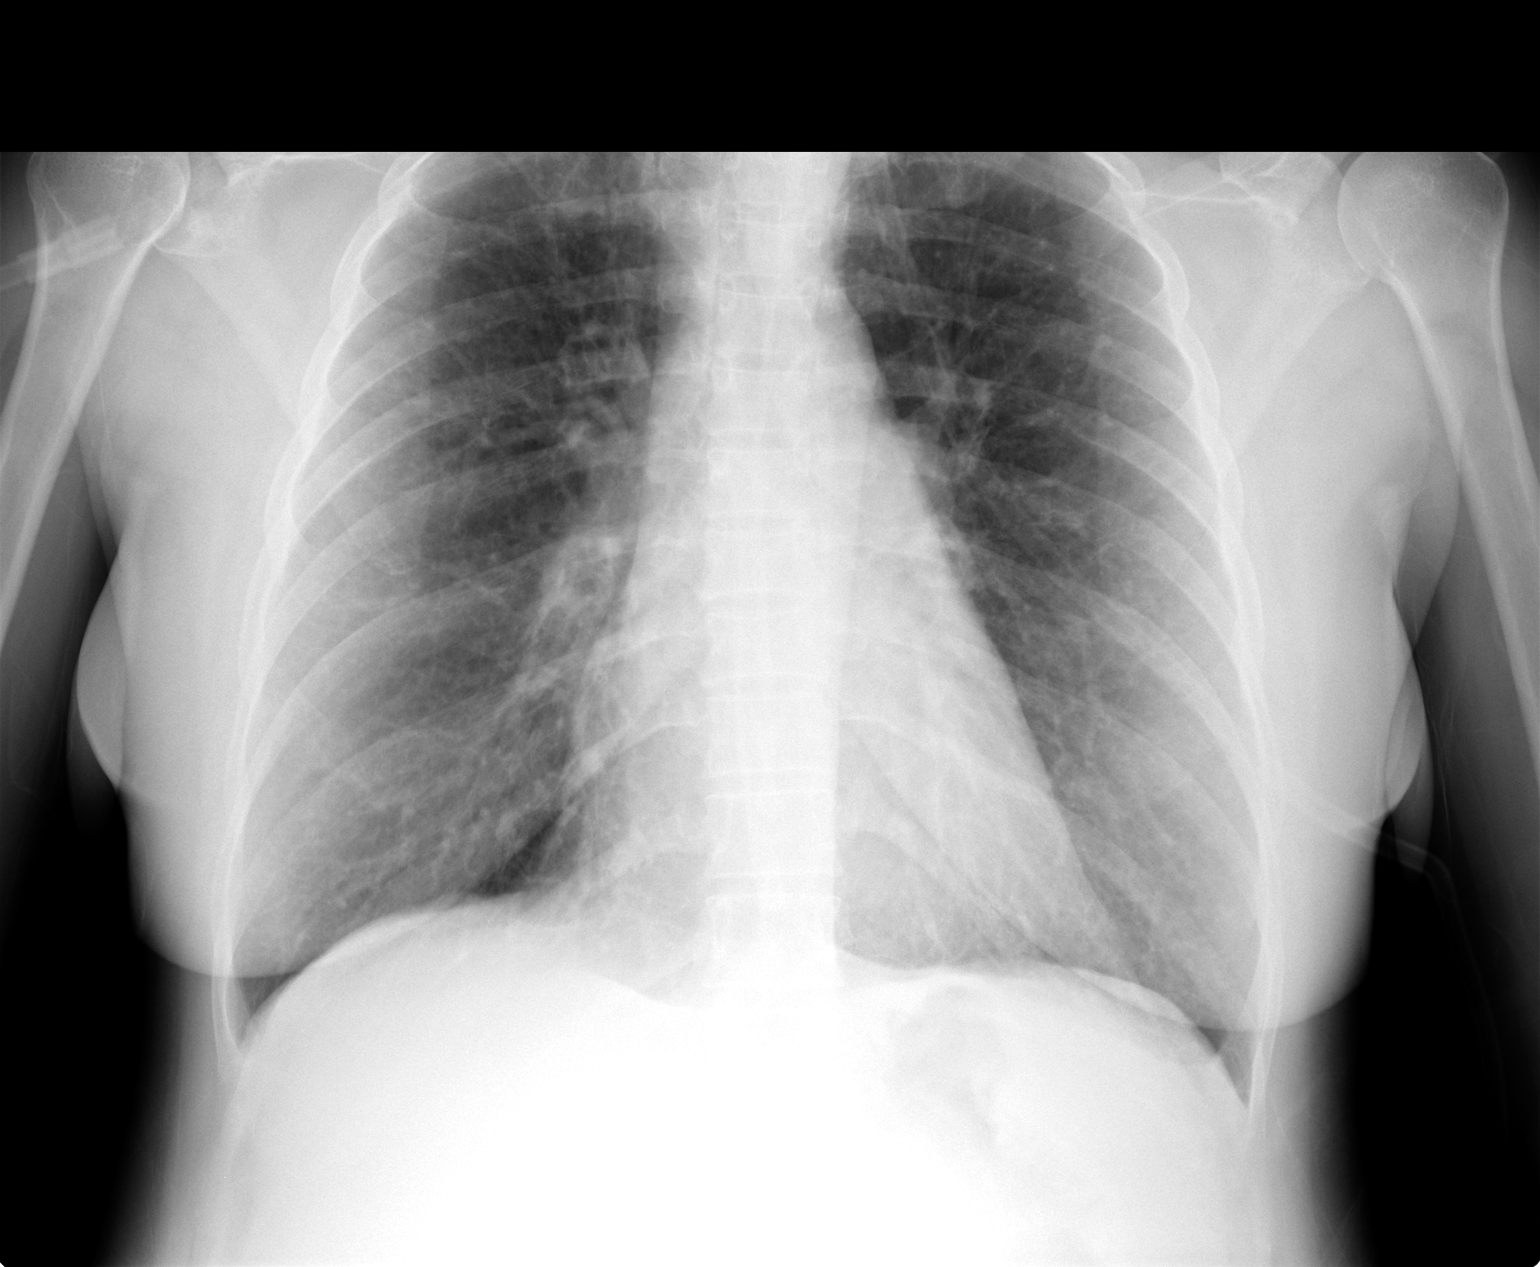

[1 of 1 positions shown; findings below may reference images not displayed]

FINDINGS: Mild bronchial thickening and interstitial prominence
diffusely.  Bronchitis not excluded.  Normal heart size and
vascularity.  No focal pneumonia, edema, effusion or pneumothorax.
Midline trachea.  Intact bony thorax.
IMPRESSION: Bronchial thickening and interstitial prominence.

## 2010-09-08 IMAGING — CR DG CHEST 1V PORT
1 series · 1 of 1 positions shown · non-contrast
Comparison: 07/27/2009

CLINICAL DATA: Shortness of breath, chest pain

PORTABLE CHEST - 1 VIEW

[view not recorded]
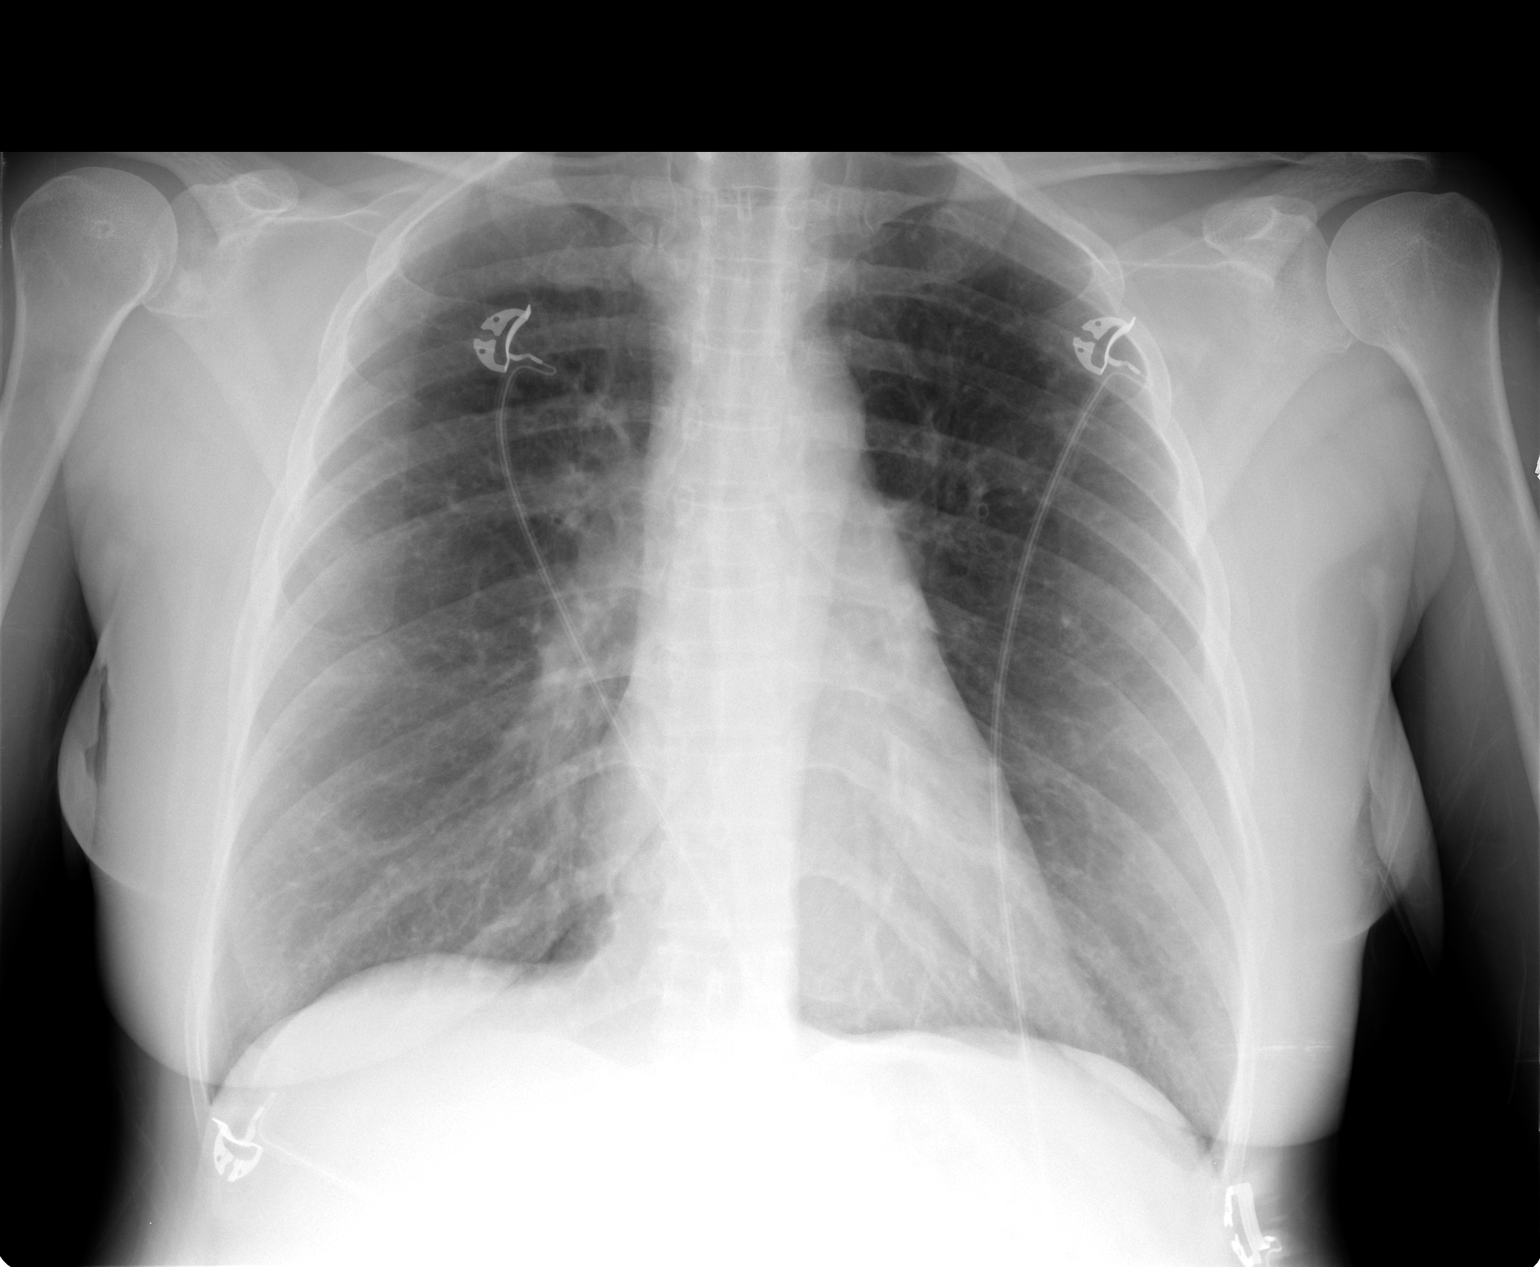

[1 of 1 positions shown; findings below may reference images not displayed]

FINDINGS: Mild central bronchial wall thickening has nearly
resolved without focal pulmonary opacity.  Heart size is normal.
Cardiac leads overlie the chest.  No effusion.
IMPRESSION: Mild central bronchial wall thickening has nearly resolved, no new
focal finding.

## 2010-09-10 IMAGING — CR DG CHEST 2V
2 series · 2 of 2 positions shown · non-contrast
Comparison: 08/03/2009

CLINICAL DATA: COPD exacerbation, wheezing, emphysema, cough,
congestion, smoker

CHEST - 2 VIEW

[view not recorded (1 of 2)]
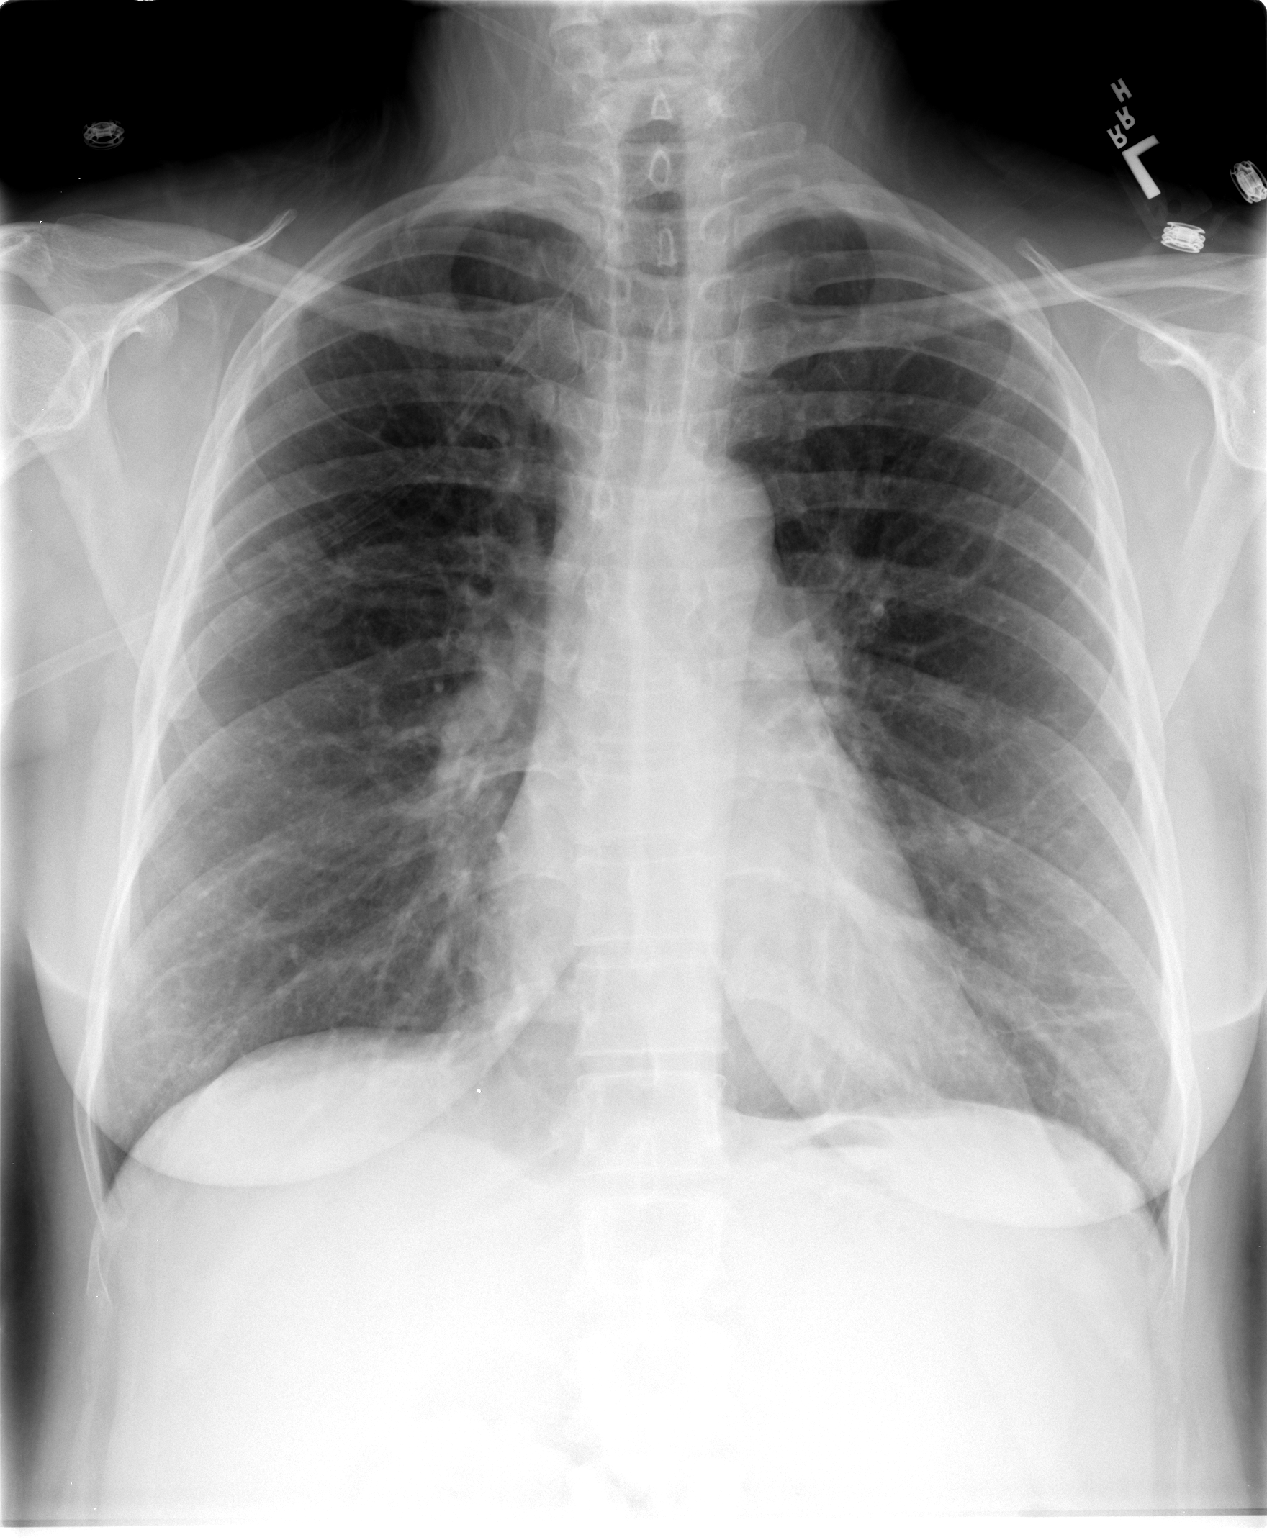

[view not recorded (2 of 2)]
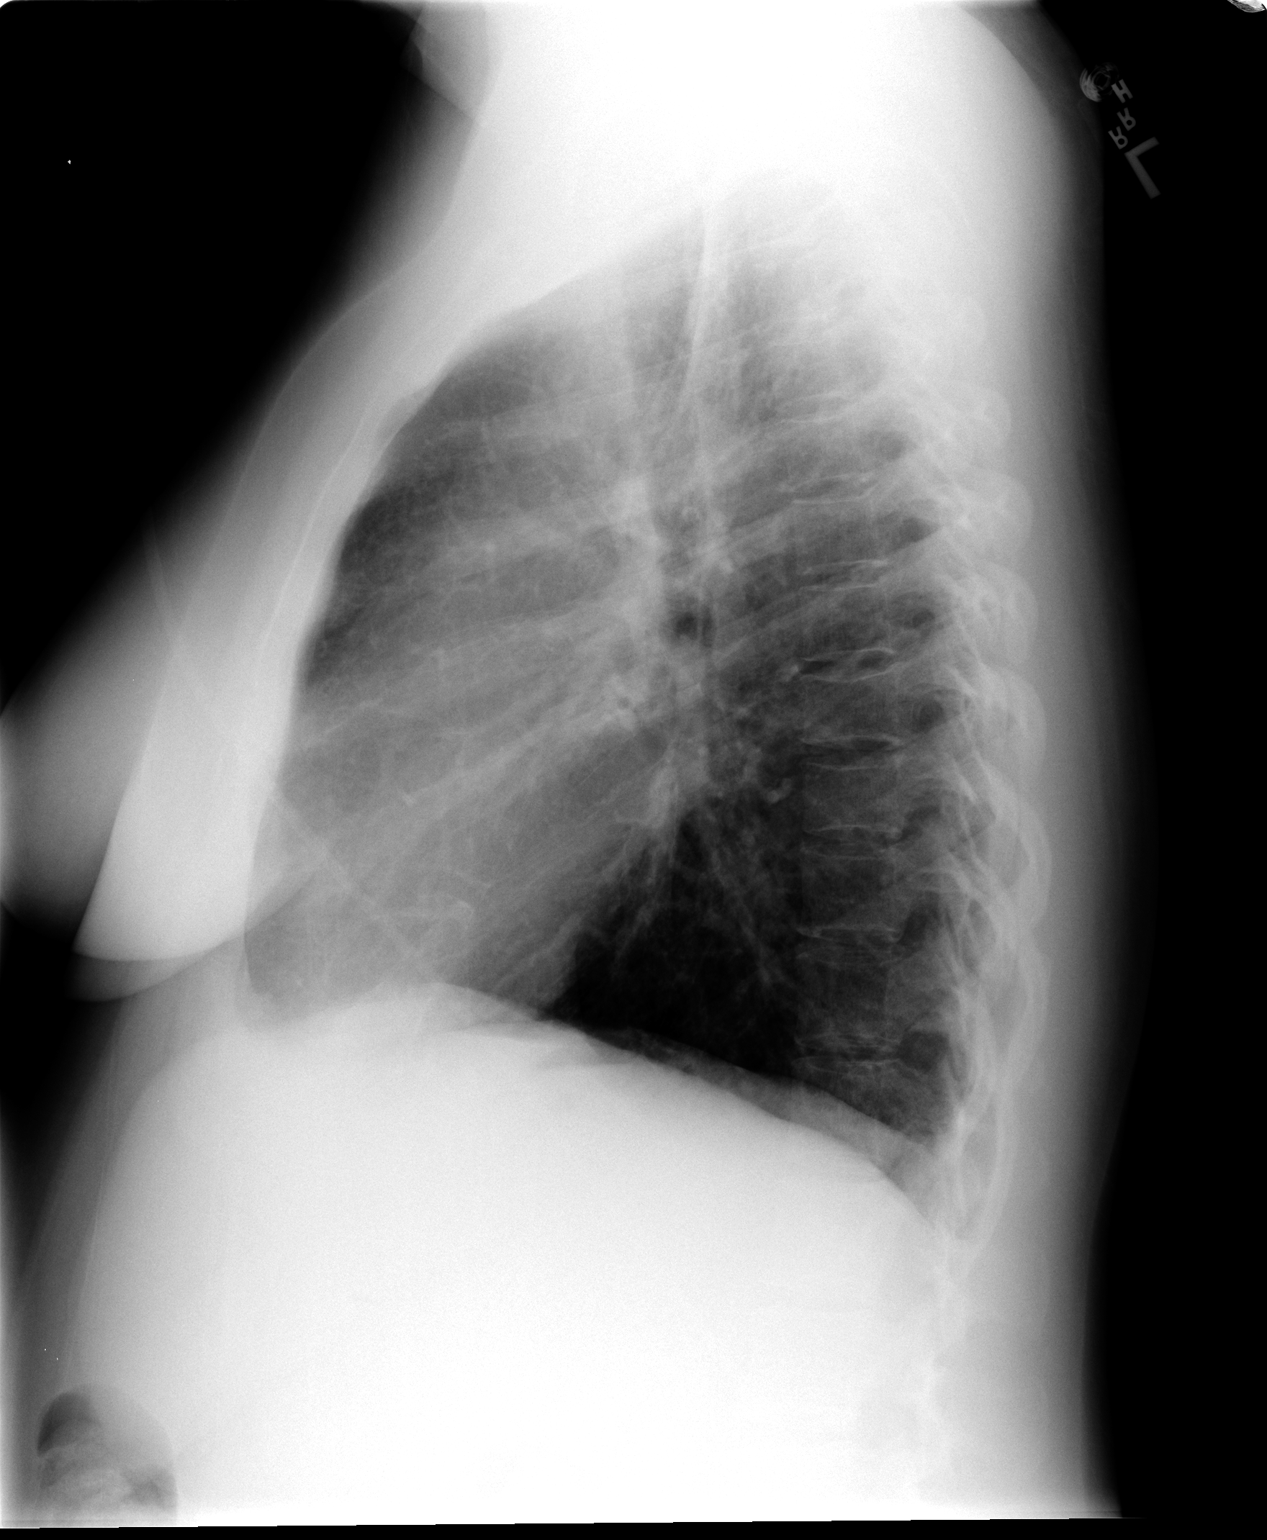

[2 of 2 positions shown; findings below may reference images not displayed]

FINDINGS: Normal heart size, mediastinal contours, and pulmonary vascularity.
Mild emphysematous and bronchitic changes compatible with COPD.
Subsegmental atelectasis left base.
No segmental consolidation, pleural effusion or pneumothorax.
No acute bony findings.
IMPRESSION: COPD/chronic bronchitis.
Subsegmental atelectasis left base.

## 2010-09-12 ENCOUNTER — Ambulatory Visit: Payer: Self-pay | Admitting: Emergency Medicine

## 2010-09-12 DIAGNOSIS — F17200 Nicotine dependence, unspecified, uncomplicated: Secondary | ICD-10-CM

## 2010-09-12 DIAGNOSIS — F17210 Nicotine dependence, cigarettes, uncomplicated: Secondary | ICD-10-CM | POA: Insufficient documentation

## 2010-09-17 IMAGING — CR DG CHEST 2V
2 series · 2 of 2 positions shown · non-contrast
Comparison: Chest radiograph performed 08/05/2009

CLINICAL DATA: Chest pain and shortness of breath; cough.  History
of COPD and smoking.

CHEST - 2 VIEW

[view not recorded (1 of 2)]
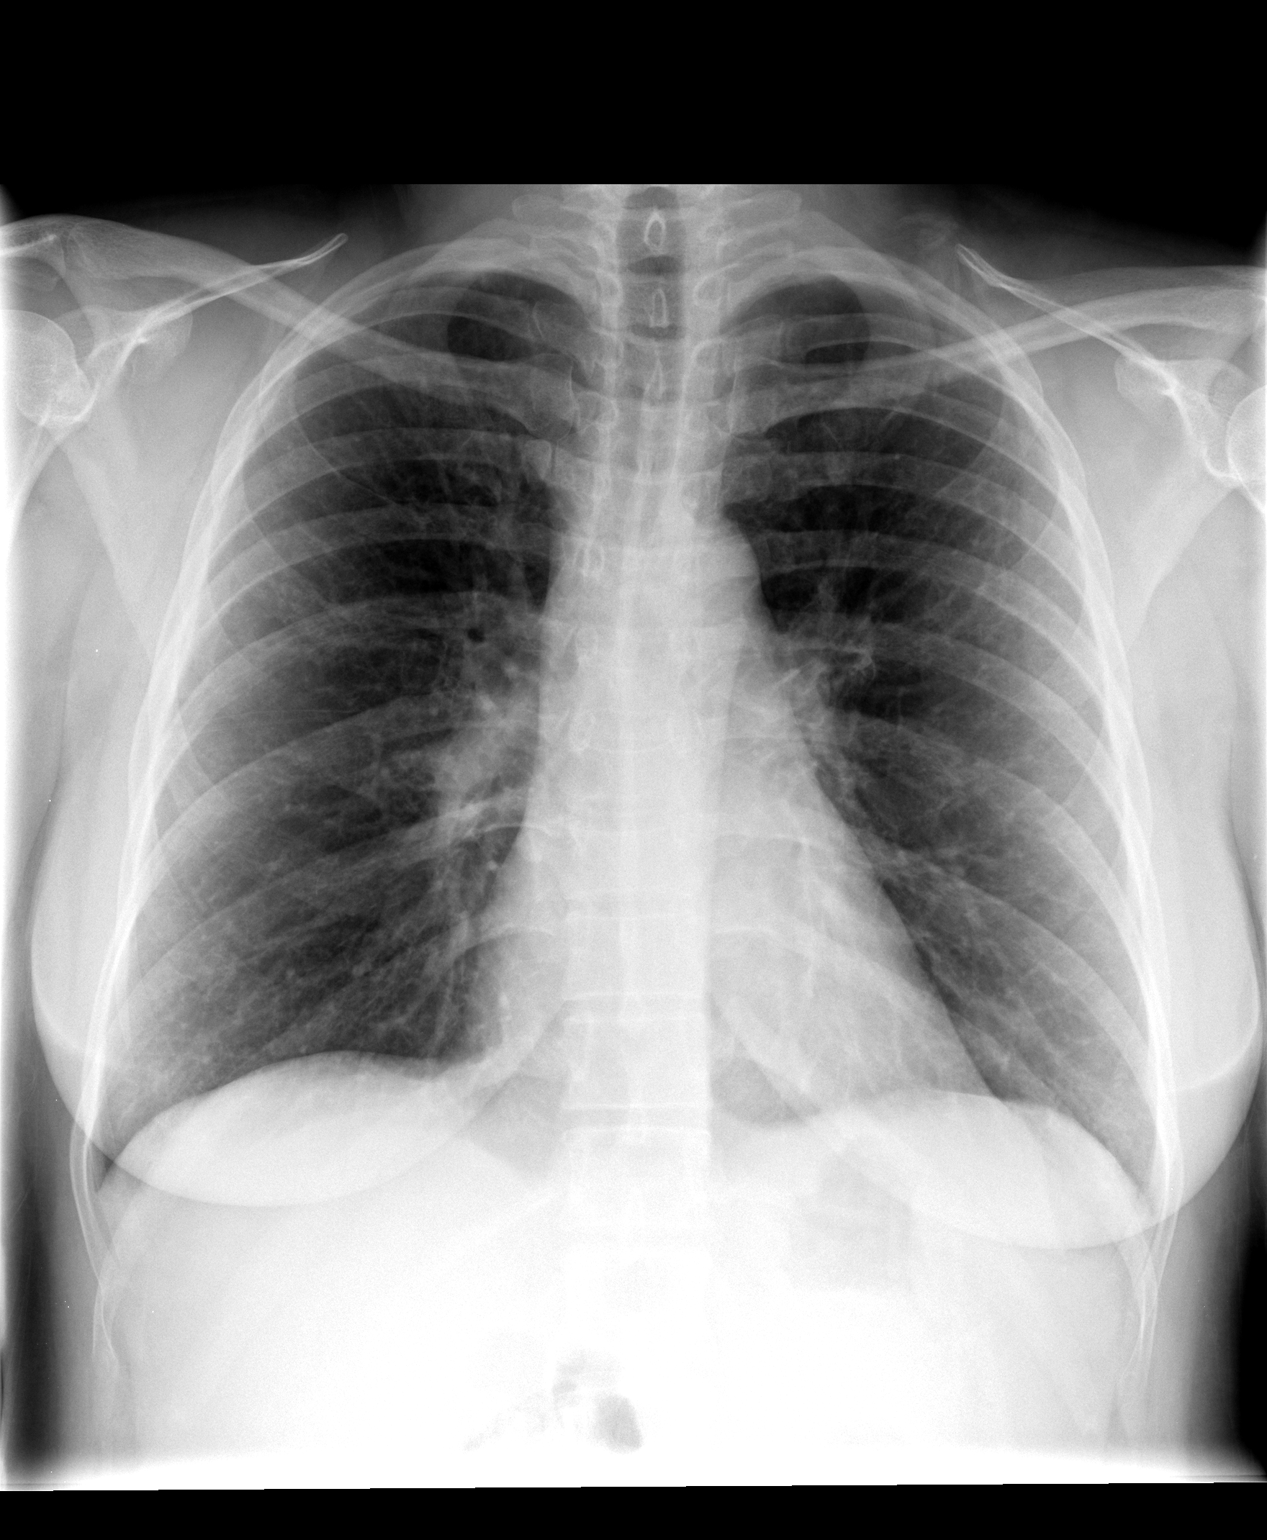

[view not recorded (2 of 2)]
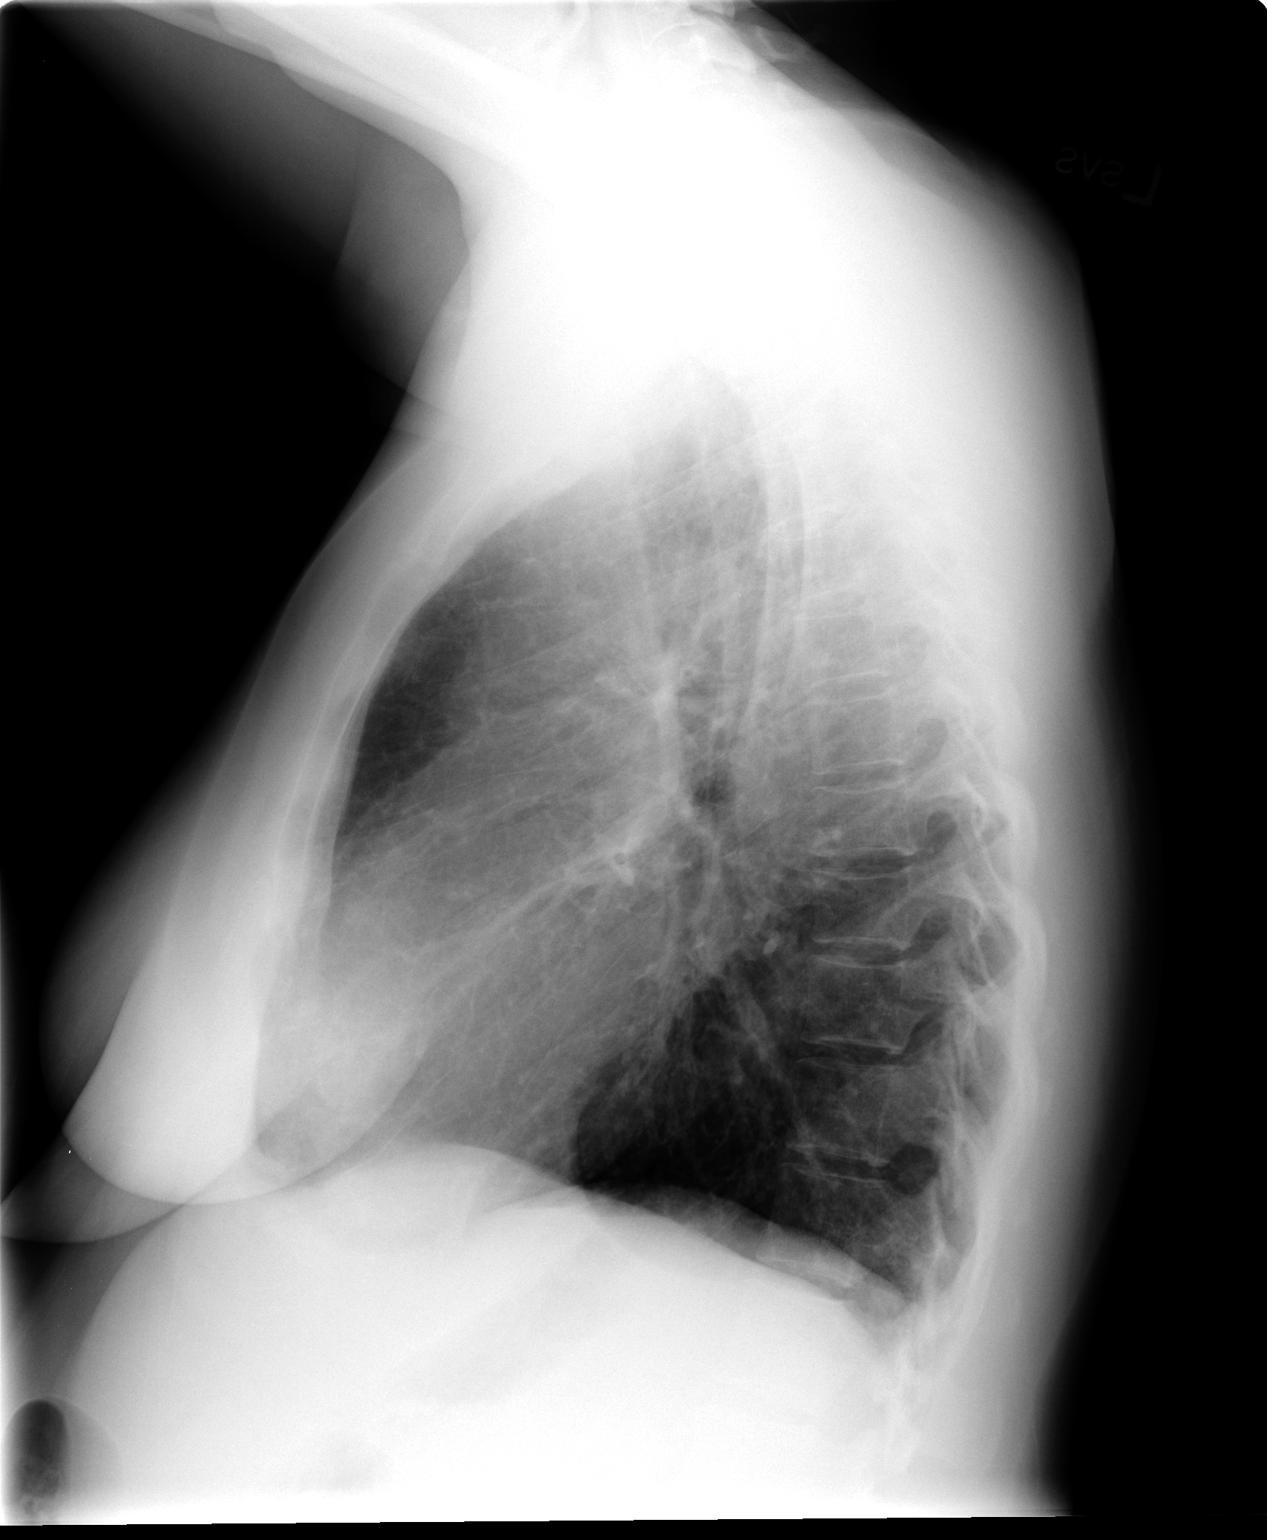

[2 of 2 positions shown; findings below may reference images not displayed]

FINDINGS: The lungs are mildly hyperexpanded, with peribronchial
thickening and mildly increased interstitial markings.  Findings
are compatible with the patients history of COPD.  There is no
evidence of focal opacification, pleural effusion or pneumothorax.

The heart is normal in size; the mediastinal contour is within
normal limits.  No acute osseous abnormalities are seen.
IMPRESSION: No acute cardiopulmonary process; findings of COPD again noted.

## 2010-10-09 ENCOUNTER — Encounter: Payer: Self-pay | Admitting: Emergency Medicine

## 2010-10-11 ENCOUNTER — Emergency Department (HOSPITAL_COMMUNITY): Admission: EM | Admit: 2010-10-11 | Discharge: 2010-10-11 | Payer: Self-pay | Admitting: Emergency Medicine

## 2010-10-12 ENCOUNTER — Emergency Department (HOSPITAL_COMMUNITY): Admission: EM | Admit: 2010-10-12 | Discharge: 2010-10-12 | Payer: Self-pay | Admitting: Emergency Medicine

## 2010-10-18 ENCOUNTER — Emergency Department (HOSPITAL_COMMUNITY): Admission: EM | Admit: 2010-10-18 | Discharge: 2010-10-18 | Payer: Self-pay | Admitting: Emergency Medicine

## 2010-10-23 ENCOUNTER — Inpatient Hospital Stay (HOSPITAL_COMMUNITY): Admission: EM | Admit: 2010-10-23 | Discharge: 2010-10-25 | Payer: Self-pay | Admitting: Emergency Medicine

## 2010-10-27 ENCOUNTER — Telehealth: Payer: Self-pay | Admitting: Emergency Medicine

## 2010-10-30 ENCOUNTER — Emergency Department (HOSPITAL_COMMUNITY): Admission: EM | Admit: 2010-10-30 | Discharge: 2010-08-17 | Payer: Self-pay | Admitting: Emergency Medicine

## 2010-11-09 ENCOUNTER — Emergency Department (HOSPITAL_COMMUNITY)
Admission: EM | Admit: 2010-11-09 | Discharge: 2010-11-09 | Payer: Self-pay | Source: Home / Self Care | Admitting: Emergency Medicine

## 2010-11-23 ENCOUNTER — Emergency Department (HOSPITAL_COMMUNITY)
Admission: EM | Admit: 2010-11-23 | Discharge: 2010-11-23 | Payer: Self-pay | Source: Home / Self Care | Admitting: Emergency Medicine

## 2010-11-30 ENCOUNTER — Inpatient Hospital Stay (HOSPITAL_COMMUNITY)
Admission: EM | Admit: 2010-11-30 | Discharge: 2010-12-03 | Payer: Self-pay | Source: Home / Self Care | Attending: Pulmonary Disease | Admitting: Pulmonary Disease

## 2010-12-01 DIAGNOSIS — J441 Chronic obstructive pulmonary disease with (acute) exacerbation: Secondary | ICD-10-CM

## 2010-12-08 LAB — BASIC METABOLIC PANEL
BUN: 10 mg/dL (ref 6–23)
BUN: 10 mg/dL (ref 6–23)
BUN: 9 mg/dL (ref 6–23)
CO2: 31 mEq/L (ref 19–32)
CO2: 30 mEq/L (ref 19–32)
CO2: 34 mEq/L — ABNORMAL HIGH (ref 19–32)
Calcium: 8.9 mg/dL (ref 8.4–10.5)
Calcium: 8.6 mg/dL (ref 8.4–10.5)
Calcium: 9.2 mg/dL (ref 8.4–10.5)
Chloride: 103 mEq/L (ref 96–112)
Chloride: 101 mEq/L (ref 96–112)
Chloride: 101 mEq/L (ref 96–112)
Creatinine, Ser: 0.55 mg/dL (ref 0.4–1.2)
Creatinine, Ser: 0.57 mg/dL (ref 0.4–1.2)
Creatinine, Ser: 0.62 mg/dL (ref 0.4–1.2)
GFR calc Af Amer: >60 mL/min (ref >60)
GFR calc Af Amer: >60 mL/min (ref >60)
GFR calc Af Amer: >60 mL/min (ref >60)
GFR calc non Af Amer: >60 mL/min (ref >60)
GFR calc non Af Amer: >60 mL/min (ref >60)
GFR calc non Af Amer: >60 mL/min (ref >60)
Glucose, Bld: 138 mg/dL — ABNORMAL HIGH (ref 70–99)
Glucose, Bld: 262 mg/dL — ABNORMAL HIGH (ref 70–99)
Glucose, Bld: 366 mg/dL — ABNORMAL HIGH (ref 70–99)
Potassium: 3.9 mEq/L (ref 3.5–5.1)
Potassium: 4.7 mEq/L (ref 3.5–5.1)
Potassium: 4.6 mEq/L (ref 3.5–5.1)
Sodium: 141 mEq/L (ref 135–145)
Sodium: 139 mEq/L (ref 135–145)
Sodium: 142 mEq/L (ref 135–145)

## 2010-12-08 LAB — CBC
HCT: 40.5 % (ref 36.0–46.0)
HCT: 39.9 % (ref 36.0–46.0)
Hemoglobin: 12.6 g/dL (ref 12.0–15.0)
Hemoglobin: 12 g/dL (ref 12.0–15.0)
MCH: 25.9 pg — ABNORMAL LOW (ref 26.0–34.0)
MCH: 25.2 pg — ABNORMAL LOW (ref 26.0–34.0)
MCHC: 31.1 g/dL (ref 30.0–36.0)
MCHC: 30.1 g/dL (ref 30.0–36.0)
MCV: 83.2 fL (ref 78.0–100.0)
MCV: 83.8 fL (ref 78.0–100.0)
Platelets: 385 10*3/uL (ref 150–400)
Platelets: 352 10*3/uL (ref 150–400)
RBC: 4.87 MIL/uL (ref 3.87–5.11)
RBC: 4.76 MIL/uL (ref 3.87–5.11)
RDW: 16.6 % — ABNORMAL HIGH (ref 11.5–15.5)
RDW: 16.8 % — ABNORMAL HIGH (ref 11.5–15.5)
WBC: 12.1 10*3/uL — ABNORMAL HIGH (ref 4.0–10.5)
WBC: 13.7 10*3/uL — ABNORMAL HIGH (ref 4.0–10.5)

## 2010-12-08 LAB — CULTURE, BLOOD (ROUTINE X 2)
Culture  Setup Time: 201201091044
Culture  Setup Time: 201201091044
Culture: NO GROWTH
Culture: NO GROWTH

## 2010-12-08 LAB — DIFFERENTIAL
Basophils Absolute: 0 10*3/uL (ref 0.0–0.1)
Basophils Absolute: 0 10*3/uL (ref 0.0–0.1)
Basophils Relative: 0 % (ref 0–1)
Basophils Relative: 0 % (ref 0–1)
Eosinophils Absolute: 0.2 10*3/uL (ref 0.0–0.7)
Eosinophils Absolute: 0 10*3/uL (ref 0.0–0.7)
Eosinophils Relative: 2 % (ref 0–5)
Eosinophils Relative: 0 % (ref 0–5)
Lymphocytes Relative: 18 % (ref 12–46)
Lymphocytes Relative: 3 % — ABNORMAL LOW (ref 12–46)
Lymphs Abs: 2.2 10*3/uL (ref 0.7–4.0)
Lymphs Abs: 0.4 10*3/uL — ABNORMAL LOW (ref 0.7–4.0)
Monocytes Absolute: 1.1 10*3/uL — ABNORMAL HIGH (ref 0.1–1.0)
Monocytes Absolute: 0.1 10*3/uL (ref 0.1–1.0)
Monocytes Relative: 9 % (ref 3–12)
Monocytes Relative: 0 % — ABNORMAL LOW (ref 3–12)
Neutro Abs: 8.7 10*3/uL — ABNORMAL HIGH (ref 1.7–7.7)
Neutro Abs: 13.2 10*3/uL — ABNORMAL HIGH (ref 1.7–7.7)
Neutrophils Relative %: 72 % (ref 43–77)
Neutrophils Relative %: 97 % — ABNORMAL HIGH (ref 43–77)

## 2010-12-08 LAB — GLUCOSE, CAPILLARY
Glucose-Capillary: 393 mg/dL — ABNORMAL HIGH (ref 70–99)
Glucose-Capillary: 138 mg/dL — ABNORMAL HIGH (ref 70–99)
Glucose-Capillary: 254 mg/dL — ABNORMAL HIGH (ref 70–99)
Glucose-Capillary: 184 mg/dL — ABNORMAL HIGH (ref 70–99)
Glucose-Capillary: 285 mg/dL — ABNORMAL HIGH (ref 70–99)
Glucose-Capillary: 249 mg/dL — ABNORMAL HIGH (ref 70–99)
Glucose-Capillary: 119 mg/dL — ABNORMAL HIGH (ref 70–99)
Glucose-Capillary: 176 mg/dL — ABNORMAL HIGH (ref 70–99)

## 2010-12-08 LAB — HEMOGLOBIN A1C
Hgb A1c MFr Bld: 6.6 % — ABNORMAL HIGH (ref <5.7)
Mean Plasma Glucose: 143 mg/dL — ABNORMAL HIGH (ref <117)

## 2010-12-08 LAB — CULTURE, RESPIRATORY: Culture: NORMAL

## 2010-12-08 LAB — BLOOD GAS, ARTERIAL
Acid-Base Excess: 5.3 mmol/L — ABNORMAL HIGH (ref 0.0–2.0)
Bicarbonate: 32.1 mEq/L — ABNORMAL HIGH (ref 20.0–24.0)
Drawn by: 308601
O2 Content: 3 L/min
O2 Saturation: 96.6 %
Patient temperature: 98.6
TCO2: 29.4 mmol/L (ref 0–100)
pCO2 arterial: 61.1 mmHg (ref 35.0–45.0)
pH, Arterial: 7.341 — ABNORMAL LOW (ref 7.350–7.400)
pO2, Arterial: 81.2 mmHg (ref 80.0–100.0)

## 2010-12-08 LAB — PHOSPHORUS: Phosphorus: 2.1 mg/dL — ABNORMAL LOW (ref 2.3–4.6)

## 2010-12-08 LAB — CULTURE, RESPIRATORY W GRAM STAIN

## 2010-12-08 LAB — MRSA PCR SCREENING: MRSA by PCR: NEGATIVE

## 2010-12-11 ENCOUNTER — Emergency Department (HOSPITAL_COMMUNITY)
Admission: EM | Admit: 2010-12-11 | Discharge: 2010-12-11 | Payer: Self-pay | Source: Home / Self Care | Admitting: Emergency Medicine

## 2010-12-14 ENCOUNTER — Encounter: Payer: Self-pay | Admitting: Family Medicine

## 2010-12-17 ENCOUNTER — Ambulatory Visit (HOSPITAL_COMMUNITY): Admission: RE | Admit: 2010-12-17 | Payer: Self-pay | Source: Ambulatory Visit | Admitting: Emergency Medicine

## 2010-12-18 ENCOUNTER — Telehealth: Payer: Self-pay | Admitting: Emergency Medicine

## 2010-12-18 LAB — URINALYSIS, ROUTINE W REFLEX MICROSCOPIC
Leukocytes, UA: NEGATIVE
Urine Glucose, Fasting: >1000 mg/dL — AB
Urobilinogen, UA: 0.2 mg/dL (ref 0.0–1.0)
pH: 5.5 (ref 5.0–8.0)

## 2010-12-18 LAB — CBC
MCH: 26.5 pg (ref 26.0–34.0)
Platelets: 420 10*3/uL — ABNORMAL HIGH (ref 150–400)
RDW: 17.1 % — ABNORMAL HIGH (ref 11.5–15.5)

## 2010-12-18 LAB — COMPREHENSIVE METABOLIC PANEL
ALT: 15 U/L (ref 0–35)
AST: 20 U/L (ref 0–37)
Albumin: 4.1 g/dL (ref 3.5–5.2)
Alkaline Phosphatase: 56 U/L (ref 39–117)
Chloride: 86 mEq/L — ABNORMAL LOW (ref 96–112)
GFR calc Af Amer: >60 mL/min (ref >60)
GFR calc non Af Amer: >60 mL/min (ref >60)
Glucose, Bld: 419 mg/dL — ABNORMAL HIGH (ref 70–99)

## 2010-12-18 LAB — URINE MICROSCOPIC-ADD ON

## 2010-12-19 LAB — HEMOGLOBIN A1C: Mean Plasma Glucose: 160 mg/dL — ABNORMAL HIGH (ref <117)

## 2010-12-23 NOTE — Progress Notes (Signed)
Summary: refill/ pt out  Phone Note Call from Patient Call back at Home Phone [phone]   Caller: Patient Call For: byrum Summary of Call: pt states that [facility] has faxed a request for refill of ventolin. (says they faxed request this am). pt is out and wants this asap. call pt @ home# Initial call taken by: Tivis Ringer, CNA,  February 25, 2010 2:45 PM  Follow-up for Phone Call        rx can be sent electronically to [facility].  called spoke with patient and informed her this has been done.  encouraged pt to call if she needs anything else.  pt verbalized her understanding. Boone Master CNA  February 25, 2010 3:17 PM     New/Updated Medications: VENTOLIN HFA 108 (90 BASE) MCG/ACT AERS (ALBUTEROL SULFATE) 2 puffs four times a day as needed Prescriptions: VENTOLIN HFA 108 (90 BASE) MCG/ACT AERS (ALBUTEROL SULFATE) 2 puffs four times a day as needed  #30days x 3   Entered by:   Boone Master CNA   Authorized by:   Leslye Peer MD   Signed by:   Boone Master CNA on 02/25/2010   Method used:   Electronically to        [facility] (retail)       [address] [place], [place]  [address]       Ph: [phone]       Fax: [phone]   RxID:   [id number]

## 2010-12-23 NOTE — Progress Notes (Signed)
Summary: referral/ pcp  Phone Note Call from Patient   Caller: Patient Call For: Harjit Douds Summary of Call: wants referral to primary care. medicaid pt. (no longer [place] access per pt). needs to find pcp before 02/24/10. per last ov, dr Zaine Elsass told pt that she would get help w/ this. pt [phone] Initial call taken by: Tivis Ringer, CNA,  February 20, 2010 8:55 AM  Follow-up for Phone Call        Spoke with pt.  Per pt, she was told at last ov on 3/25 by RB that he would help her find a PCP however there are no orders in EMR and pt states she was not taken to [facility] for this.  Pt states she has Medicaid and needs to find a PCP by April 4.  Pt informed RB will not be back in office until April 5th so she is ok with me asking "doc of the day" for a referal.  Will forward to CY - pls advise of a PCP who accepts Medicaid.  Thanks!  Follow-up by: Gweneth Dimitri RN,  February 20, 2010 9:14 AM  Additional Follow-up for Phone Call Additional follow up Details #1::        Will see if PCC's know. I'm not sure how Dr Kellie Shropshire or [facility] do about [facility] Referral would be from Dr Delton Coombes.    Additional Follow-up for Phone Call Additional follow up Details #2::    Called pt and she stated that she no longer has [employer]. When pt was in office to see Dr. Delton Coombes. This issue of locating a primary phys came up. We pulled pt's card and saw that she was [place] access and who they had assigned her to was [facility]. I called them to schedule pt an appt and was told that pt didn't need an appt, she could just walk in. Pt was told this at her office visit.  Pt stated today that she didn't have [facility] access. Advised pt that we have no idea which physicians are taking new medicaid pt's. Pt has a case worker and she has her name and phone number. Advised pt that she needs to check with her and she can provide her with some physicians in her area that will take her insurance. Pt was advised  that our primary care physicians were not taking new medicaid patients. Pt stated that she would call her caseworker and start there.   Follow-up by: Alfonso Ramus,  February 20, 2010 1:55 PM

## 2010-12-23 NOTE — Medication Information (Signed)
Summary: Ventolin/[facility]  Ventolin/[facility]   Imported By: Lester [place] 10/21/2010 10:34:06  _____________________________________________________________________  External Attachment:    Type:   Image     Comment:   External Document

## 2010-12-23 NOTE — Assessment & Plan Note (Signed)
Summary: COPD, tobacco   Visit Type:  Follow-up Primary Provider/Referring Provider:  Reyes Ivan  CC:  2 month followup COPD.  ..Joyce KitchenMarland KitchenPatient states she is still smoking 2 packs a day., pt states she has had a lot of memeory loss x 3 mos and is wondering if oit has anythi ng to do with hospitalization and being in a coma...Joyce Kitchenpt also c/o productive cough accompanied by yellow sputum, and sinusitis isd a prblem x 1 week.  History of Present Illness: 45 yo woman, heavy smoker, treated 1-2/11 for necrotic PNA and ARDS, resp failure. Also w hx substance abuse, narcotics use for back pain. Started on BD's.   D/C from hosp to SNF, but left AMA. Has been living with her mom. Taking percocet 15mg  1 -2 x a day (off the streets). Tells me that she is slowly recovering but feels she may be at a plateau. She can't stop smoking. Has freq cough with clear sputum. breathing OK. She does have BD's that she was on prior to her hospitalization. Advair + Spiriva, as needed ventolin.   ROV 04/16/10 -- f/u for COPD, admission for necrotic PNA and ARDS (s/p trach, decanulated). Still smoking 2 pk/day. Taking her BDs reliably. Having trouble with memory loss. Hasn't used THC since May. No exacerbations since last visit. Hasn't had PFT yet. Activity level has improved.   Preventive Screening-Counseling & Management  Alcohol-Tobacco     Smoking Status: current     Smoking Cessation Counseling: yes     Smoke Cessation Stage: ready     Packs/Day: 2.0  Current Medications (verified): 1)  Atrovent 0.06 % Soln (Ipratropium Bromide) .[name].. 1 Two Times A Day 2)  Albuterol Sulfate (2.5 Mg/9ml) 0.083% Nebu (Albuterol Sulfate) .[name].. 1 Vial Four Times A Day 3)  Advair Diskus 250-50 Mcg/dose Aepb (Fluticasone-Salmeterol) .[name].. 1 Puff Two Times A Day 4)  Spiriva Handihaler 18 Mcg Caps (Tiotropium Bromide Monohydrate) .... Once Daily 5)  Ventolin Hfa 108 (90 Base) Mcg/act Aers (Albuterol Sulfate) .... 2 Puffs Four Times A Day As  Needed 6)  Abilify 10 Mg Tabs (Aripiprazole) .[name].. 1 By Mouth Two Times A Day  Allergies (verified): 1)  ! Morphine 2)  ! Pcn  Social History: Smoking Status:  current Packs/Day:  2.0  Vital Signs:  Patient profile:   45 year old female Height:      61 inches Weight:      137.8 pounds O2 Sat:      90 % on Room air Temp:     98.3 degrees F oral Pulse rate:   115 / minute BP sitting:   112 / 78  (left arm) Cuff size:   regular  Vitals Entered By: Denna Haggard, CMA (Apr 16, 2010 11:48 AM)  O2 Flow:  Room air  Physical Exam  General:  debilitated woman, NAD Head:  normocephalic and atraumatic Eyes:  conjunctiva and sclera clear Nose:  no deformity, discharge, inflammation, or lesions Mouth:  no deformity or lesions Neck:  trach scar well-healed Lungs:  bilateral coarse wheezes on exp Heart:  regular rate and rhythm, S1, S2 without murmurs, rubs, gallops, or clicks Abdomen:  not examined Extremities:  no clubbing, cyanosis, edema, or deformity noted Neurologic:  slightly ataxic, otherwise non-focal Skin:  intact without lesions or rashes Psych:  oriented x 3, appropriate   Impression & Recommendations:  Problem # 1:  COPD (ICD-496) Continue your Advair and Spiriva, ventolin as needed  Walking oximetry today showed that you are borderline for  requiring oxygen. We will recheck you at a future visit. Hopefully this will improve as you stop smoking You need to cut down to 1pk/day by our next visit. 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 1 patch once daily  Other Orders: Prescription Created Electronically [id number]) Est. Patient Level IV  (37106) Tobacco use cessation intensive >10 minutes (26948)  Patient Instructions: 1)  Continue your Advair and Spiriva, ventolin as needed  2)  Walking oximetry today showed that you are borderline for requiring oxygen. We will recheck you at a future visit. Hopefully this will improve as you stop smoking 3)  You need to cut down to 1pk/day by our next visit. A script for nicotine patches was sent to your pharmacy 4)  We will perform full PFT at your next visit 5)  Follow up with Dr Delton Coombes in 3 months or as needed  Prescriptions: NICOTINE 14 MG/24HR PT24 (NICOTINE) 1 patch once daily  #21 patches x 1   Entered and Authorized by:   Leslye Peer MD   Signed by:   Leslye Peer MD on 04/16/2010   Method used:   Electronically to        [facility] (retail)       [address]       [place], [place]  [address]       Ph: [phone]       Fax: [phone]   RxID:   [id number]   Appended Document: COPD, tobacco Ambulatory Pulse Oximetry  Resting; HR__114___    02 Sat__91%ra___  Lap1 (185 feet)   HR__115___   02 Sat__91%ra___ Lap2 (185 feet)   HR__117___   02 Sat__88%ra___    Lap3 (185 feet)   HR_____   02 Sat_____  ___Test Completed without Difficulty _x__Test Stopped due to: pt c/o increased SOB and also back pain.  Her o2 sat decreased to 88%ra after her second lap. Sat recovered to 96% ra after resting approx 2 min.

## 2010-12-23 NOTE — Progress Notes (Signed)
Summary: refill  Phone Note Call from Patient Call back at [phone]   Caller: Patient Call For: Lora Glomski Summary of Call: Pt states she needs to get her abilify 10mg  refilled one more time because she doesn't see her new doctor until next month.//[facility]. Initial call taken by: Darletta Moll,  May 02, 2010 3:26 PM  Follow-up for Phone Call        [facility] x 1. Refill denial from 05/02/10 by Michel Bickers "This medication was filled as a courtesy in 01/2010. Any further refills should come from the patient's PCP." Zackery Barefoot CMA  May 02, 2010 4:05 PM   Pt informed. Zackery Barefoot CMA  May 02, 2010 4:58 PM

## 2010-12-23 NOTE — Assessment & Plan Note (Signed)
Summary: post-hosp, tobacco, narcs, depression   Visit Type:  Hospital Follow-up  CC:  Post Hospital-01-15-10; "had to learn to rewalk but doing ok for the shape she's in".  History of Present Illness:   D/C from hosp to SNF, but left AMA. Has been living with her mom. Taking percocet 15mg  1 -2 x a day (off the streets). Tells me that she is slowly recovering but feels she may be at a plateau. She can't stop smoking. Has freq cough with clear sputum. breathing OK. She does have BD's that she was on prior to her hospitalization. Advair + Spiriva, as needed ventolin.   Current Medications (verified): 1)  Atrovent 0.06 % Soln (Ipratropium Bromide) .[name].. 1 Two Times A Day 2)  Albuterol Sulfate (2.5 Mg/18ml) 0.083% Nebu (Albuterol Sulfate) .[name].. 1 Vial Four Times A Day 3)  Advair Diskus 250-50 Mcg/dose Aepb (Fluticasone-Salmeterol) .[name].. 1 Puff Two Times A Day 4)  Spiriva Handihaler 18 Mcg Caps (Tiotropium Bromide Monohydrate) .... Once Daily 5)  Ventolin Hfa 108 (90 Base) Mcg/act Aers (Albuterol Sulfate) .... 2 Puffs Four Times A Day As Needed  Allergies (verified): 1)  ! Morphine 2)  ! Pcn  Social History: Formerly used cocaine, narcs, THC.  Smoking 1.5pk/day Living w her mom  Vital Signs:  Patient profile:   45 year old female Height:      61 inches Weight:      141.50 pounds BMI:     26.83 O2 Sat:      96 % on Room air Temp:     99 degrees F oral Pulse rate:   101 / minute BP sitting:   116 / 82  (left arm) Cuff size:   regular  Vitals Entered By: Reynaldo Minium CMA (February 14, 2010 3:41 PM)  O2 Flow:  Room air  Physical Exam  General:  debilitated woman, NAD Head:  normocephalic and atraumatic Eyes:  conjunctiva and sclera clear Nose:  no deformity, discharge, inflammation, or lesions Mouth:  no deformity or lesions Neck:  trach scar well-healed Lungs:  clear bilaterally to auscultation and percussion Heart:  regular rate and rhythm, S1, S2 without murmurs, rubs,  gallops, or clicks Abdomen:  not examined Extremities:  no clubbing, cyanosis, edema, or deformity noted Neurologic:  slightly ataxic, otherwise non-focal Skin:  intact without lesions or rashes Psych:  doesn't remenber me from the hospital, oriented x 3, appropriate   Impression & Recommendations:  Problem # 1:  COPD (ICD-496) continue spiriva and advair for now may get PFT at some time in the future  we agreed to smoking goal = cut down to 1 pk a day until she is ready to contract to another goal, work her way towards quitting  Problem # 2:  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 Most important issue of the day is to get her connected with a PCP. She needs referral for PT/OT, management of her chronic narcotics, management of her psych meds, etc. I am happy to see her regarding the smoking and COPD, but she needs someone to coordinate her care  Orders: Est. Patient Level IV (33295)  Medications Added to Medication List This Visit: 1)  Atrovent 0.06 % Soln (Ipratropium bromide) .[name].. 1 two times a day 2)  Albuterol Sulfate (2.5 Mg/77ml) 0.083% Nebu (Albuterol sulfate) .Marland KitchenMarland KitchenMarland Kitchen  1 vial four times a day 3)  Advair Diskus 250-50 Mcg/dose Aepb (Fluticasone-salmeterol) .[name].. 1 puff two times a day 4)  Spiriva Handihaler 18 Mcg Caps (Tiotropium bromide monohydrate) .... Once daily 5)  Ventolin Hfa 108 (90 Base) Mcg/act Aers (Albuterol sulfate) .... 2 puffs four times a day as needed 6)  Abilify 10 Mg Tabs (Aripiprazole) .[name].. 1 by mouth two times a day  Patient Instructions: 1)  Continue your Advair and Spiriva for now. 2)  We agreed that you would try to cut down your cigarettes to 1 pk/day. Work on this until our next visit.  3)  We will help you find a primary care physician.  4)   Follow with Dr Delton Coombes in 2 months.  Prescriptions: ABILIFY 10 MG TABS (ARIPIPRAZOLE) 1 by mouth two times a day  #60 x 0   Entered and Authorized by:   Leslye Peer MD   Signed by:   Leslye Peer MD on 02/14/2010   Method used:   Print then Give to Patient   RxID:   [id number]

## 2010-12-23 NOTE — Assessment & Plan Note (Signed)
Summary: COPD, tobacco   Visit Type:  Follow-up Primary Provider/Referring Provider:  [facility] Dept  CC:  Followup bronchitis and COPD.  Pt states that her breathing is back to her normal baseline- less need for rescue.  She still has some wheezing.  Cough has improved although still has occ cough with white sputum.  Still smoking 1 ppd- "ready to quit" She states that today is her quit date.[name]  History of Present Illness: 45 yo woman, heavy smoker, treated 1-2/11 for necrotic PNA and ARDS, resp failure. Also w hx substance abuse, narcotics use for back pain. Started on BD's.   D/C from hosp to SNF, but left AMA. Has been living with her mom. Taking percocet 15mg  1 -2 x a day (off the streets). Tells me that she is slowly recovering but feels she may be at a plateau. She can't stop smoking. Has freq cough with clear sputum. breathing OK. She does have BD's that she was on prior to her hospitalization. Advair + Spiriva, as needed ventolin.   ROV 04/16/10 -- f/u for COPD, admission for necrotic PNA and ARDS (s/p trach, decanulated). Still smoking 2 pk/day. Taking her BDs reliably. Having trouble with memory loss. Hasn't used THC since May. No exacerbations since last visit. Hasn't had PFT yet. Activity level has improved.   ROV 07/22/10 -- COPD and tobacco. Has cut down to 1 pk/day. Still using narcs off the street occas but planning for MRI and pain clinic appointment in [facility]. She feels better, breathing is better, has been walking every day. Advair + Spiriva.   ROV 09/01/10 -- tobacco abuse and COPD. She began to have increased cough, dark sputum since mid September. Was treated with Pred last Friday for her back pain, ? allergic rxn to neurontin, finishing prednisone in 2 days. Quit going to Pain clinic. Asking me for meds for anxiety.   ROV 09/12/10 -- COPD and tobacco, still using 1pk/day. last time treated for AE/bronchitis with Avelox (she was already on pred). Feels better,  mucous is improved.  Still has basline wheeze.   Current Medications (verified): 1)  Albuterol Sulfate (2.5 Mg/37ml) 0.083% Nebu (Albuterol Sulfate) .[name].. 1 Vial Every 4 Hours As Needed Shortness of Breath 2)  Advair Diskus 250-50 Mcg/dose Aepb (Fluticasone-Salmeterol) .[name].. 1 Puff Two Times A Day 3)  Spiriva Handihaler 18 Mcg Caps (Tiotropium Bromide Monohydrate) .... Once Daily 4)  Ventolin Hfa 108 (90 Base) Mcg/act Aers (Albuterol Sulfate) .... 2 Puffs Four Times A Day As Needed 5)  Abilify 15 Mg Tabs (Aripiprazole) .[name].. 1 By Mouth Daily 6)  Effexor Xr 150 Mg Xr24h-Cap (Venlafaxine Hcl) .[name].. 1 Once Daily 7)  Nicoderm Cq 14 Mg/24hr Pt24 (Nicotine) .[name].. 1 Patch Daily X 2 Wks 8)  Nicoderm Cq 7 Mg/24hr Pt24 (Nicotine) .[name].. 1 Patch Daily X 2 Wks Then Stop  Allergies (verified): 1)  ! Morphine 2)  ! Pcn  Vital Signs:  Patient profile:   45 year old female Weight:      153.50 pounds O2 Sat:      93 % on Room air Temp:     98.5 degrees F oral Pulse rate:   100 / minute BP sitting:   114 / 72  (left arm)  Vitals Entered By: Vernie Murders (September 12, 2010 4:07 PM)  O2 Flow:  Room air  Physical Exam  General:  debilitated woman, NAD Head:  normocephalic and atraumatic Eyes:  conjunctiva and sclera clear Nose:  no deformity, discharge, inflammation,  or lesions Mouth:  no deformity or lesions Neck:  trach scar well-healed Lungs:  bilat exp wheezes, harsh breath sounds, little change Heart:  regular rate and rhythm, S1, S2 without murmurs, rubs, gallops, or clicks Abdomen:  not examined Extremities:  no clubbing, cyanosis, edema, or deformity noted Neurologic:  non-focal Skin:  intact without lesions or rashes Psych:  oriented x 3, appropriate   Impression & Recommendations:  Problem # 1:  TOBACCO ABUSE (ICD-305.1) Discussed cessation plan in detail. Quit date established, nicotine patches ordered.  Orders: Tobacco use cessation intensive >10 minutes (19147)  Her updated  medication list for this problem includes:    Nicoderm Cq 14 Mg/24hr Pt24 (Nicotine) .[name]... 1 patch daily x 2 wks    Nicoderm Cq 7 Mg/24hr Pt24 (Nicotine) .[name]... 1 patch daily x 2 wks then stop  Problem # 2:  COPD (ICD-496) Same BD's Most important issue now is tobacco flu shot pneumovax  Medications Added to Medication List This Visit: 1)  Effexor Xr 150 Mg Xr24h-cap (Venlafaxine hcl) .[name].. 1 once daily 2)  Nicoderm Cq 14 Mg/24hr Pt24 (Nicotine) .[name].. 1 patch daily x 2 wks 3)  Nicoderm Cq 7 Mg/24hr Pt24 (Nicotine) .[name].. 1 patch daily x 2 wks then stop  Other Orders: Est. Patient Level IV (82956) Admin 1st Vaccine (21308) Flu Vaccine 38yrs + (65784) Pneumococcal Vaccine (69629) Admin of Any Addtl Vaccine (52841)  Patient Instructions: 1)  Continue same inhaled medications.  2)  CONGRATULATIONS on quitting smoking! Your quit date is 09/13/10. We discussed strategies for stopping. We will use nicotine patches 14mg  once daily for 2 weeks, then 7mg  once daily for 2 weeks, then stop.  3)  Follow with Dr Delton Coombes in 6 weeks  4)  The Quitline Number: 1-800-QUIT-NOW. Call this number or our office if you need help.  Prescriptions: NICODERM CQ 7 MG/24HR PT24 (NICOTINE) 1 patch daily x 2 wks then stop  #1box x 0   Entered by:   Vernie Murders   Authorized by:   Leslye Peer MD   Signed by:   Vernie Murders on 09/12/2010   Method used:   Telephoned to ...       [facility] (retail)       [address]       [place], [place]  [address]       Ph: [phone]       Fax: [phone]   RxID:   [id number] NICODERM CQ 14 MG/24HR PT24 (NICOTINE) 1 patch daily x 2 wks  #1 box x 0   Entered by:   Vernie Murders   Authorized by:   Leslye Peer MD   Signed by:   Vernie Murders on 09/12/2010   Method used:   Telephoned to ...       [facility] (retail)       [address]       [place], [place]  [address]       Ph:  [phone]       Fax: [phone]   RxID:   [id number]    Immunizations Administered:  Pneumonia Vaccine:    Vaccine Type: Pneumovax    Site: right deltoid    Mfr: Merck    Dose: 0.5 ml    Given by: Vernie Murders    Exp. Date: 03/18/2012    Lot #: [id number]    VIS given:  10/28/09 version given September 12, 2010.         Flu Vaccine Consent Questions     Do you have a history of severe allergic reactions to this vaccine? no    Any prior history of allergic reactions to egg and/or gelatin? no    Do you have a sensitivity to the preservative Thimersol? no    Do you have a past history of Guillan-Barre Syndrome? no    Do you currently have an acute febrile illness? no    Have you ever had a severe reaction to latex? no    Vaccine information given and explained to patient? yes    Are you currently pregnant? no    Lot Number:[id number]   Exp Date:05/23/2011   Site Given  Left Deltoid IMflu1 Vernie Murders  September 12, 2010 5:00 PM

## 2010-12-23 NOTE — Progress Notes (Signed)
Summary: nos appt  Phone Note Call from Patient   Caller: [email or web address]  Call For: [name] Summary of Call: ATC pt to rsc nos from 12/2, no vm. Initial call taken by: Darletta Moll,  October 27, 2010 11:21 AM

## 2010-12-23 NOTE — Progress Notes (Signed)
Summary: ventolin usage---needs ov   Phone Note Call from Patient Call back at [phone]   Caller: Patient Call For: BYRUM Summary of Call: NEED PRESCRIPT FOR VENTIL REFILLED  PH[facility]Y [facility] Initial call taken by: Rickard Patience,  August 07, 2010 3:48 PM  Follow-up for Phone Call        Ventolin Rx last sent to [facility] 5.26.11 #1 x 5 -- she should have rxs left.  Called, [facility].  Spoke with Riki Rusk.  Per Riki Rusk, pt filled med 5.26, 6.10, 6.25, 7.13, 8.1, 8.15, 9.1 and each canister has 200 puffs.  He states he would rec increasing Adviar strength d/t this.    Called pt to verifiy how she is taking ventolin.  She states she is using 2 puffs every 4-6 hours as needed -- approx 5-6 times daily.  Also using albuterol neb as needed -- approx twice weekly.  Using Ventolin d/t wheezing, denies increased SOB, chest tightness, increased cough.  Offered OV for tomorrow to discuss this with DR but pt declined stating she did not have transportation.  Currently out of Ventolin -- Dr. Maple Hudson, pls advise recs and if ok to send ventolin rx Follow-up by: Gweneth Dimitri RN,  August 07, 2010 4:11 PM  Additional Follow-up for Phone Call Additional follow up Details #1::        I refilled Ventolin HFA for tonight. Will refer issue to Dr Delford Field to arrange f/u. Additional Follow-up by: Waymon Budge MD,  August 07, 2010 5:50 PM    Additional Follow-up for Phone Call Additional follow up Details #2::    Pt aware ventolin rx sent Gweneth Dimitri RN  August 07, 2010 5:54 PM   Additional Follow-up for Phone Call Additional follow up Details #3:: Details for Additional Follow-up Action Taken: this pt needs ov asap  [facility] Vernie Murders  August 11, 2010 5:31 PM    Additional Follow-up by: Storm Frisk MD,  August 08, 2010 8:49 AM  Prescriptions: VENTOLIN HFA 108 (90 BASE) MCG/ACT AERS (ALBUTEROL SULFATE) 2 puffs four times a day as needed  #1 x  5   Entered and Authorized by:   Waymon Budge MD   Signed by:   Waymon Budge MD on 08/07/2010   Method used:   Electronically to        [facility] (retail)       [address]       [place], [place]  [address]       Ph: [phone]       Fax: [phone]   RxID:   [id number]  LMTCBx1. there is an opening with KC at 3:15. Carron Curie CMA  August 08, 2010 2:09 PM  [facility] Gweneth Dimitri RN  August 11, 2010 5:27 PM   called and spoke with pt.  informed her of PW's recs.  Pt states she "only wants to see RB as that is who her physician is."  RB out of office this week.  Scheduled pt to see him next Tuesday 08/19/2010 at 2:10pm.  Pt was ok with this.  Pt is aware to go to ER or UC if symptoms worsen and she needs immediate attention.  Will forward message to RB so he is aware.  Aundra Millet Reynolds LPN  August 12, 2010 11:14 AM

## 2010-12-23 NOTE — Assessment & Plan Note (Signed)
Summary: bronchitis, COPD   Visit Type:  Follow-up Primary Provider/Referring Provider:  [facility] Dept  CC:  COPD...excessive use of Ventolin...the patient c/o increased SOB with exertion and at rest x2 weeks...cough with brownish mucus x1 week...wheezing is worse at night...still smoking 1 pack per day...using her Ventolin and nebulizer every four hours.  History of Present Illness: 45 yo woman, heavy smoker, treated 1-2/11 for necrotic PNA and ARDS, resp failure. Also w hx substance abuse, narcotics use for back pain. Started on BD's.   D/C from hosp to SNF, but left AMA. Has been living with her mom. Taking percocet 15mg  1 -2 x a day (off the streets). Tells me that she is slowly recovering but feels she may be at a plateau. She can't stop smoking. Has freq cough with clear sputum. breathing OK. She does have BD's that she was on prior to her hospitalization. Advair + Spiriva, as needed ventolin.   ROV 04/16/10 -- f/u for COPD, admission for necrotic PNA and ARDS (s/p trach, decanulated). Still smoking 2 pk/day. Taking her BDs reliably. Having trouble with memory loss. Hasn't used THC since May. No exacerbations since last visit. Hasn't had PFT yet. Activity level has improved.   ROV 07/22/10 -- COPD and tobacco. Has cut down to 1 pk/day. Still using narcs off the street occas but planning for MRI and pain clinic appointment in [facility] She feels better, breathing is better, has been walking every day. Advair + Spiriva.   ROV 09/01/10 -- tobacco abuse and COPD. She began to have increased cough, dark sputum since mid September. Was treated with Pred last Friday for her back pain, ? allergic rxn to neurontin, finishing prednisone in 2 days. Quit going to Pain clinic. Asking me for meds for anxiety.   Preventive Screening-Counseling & Management  Alcohol-Tobacco     Smoking Status: current     Packs/Day: 1.0  Current Medications (verified): 1)  Albuterol Sulfate (2.5  Mg/68ml) 0.083% Nebu (Albuterol Sulfate) .[name].. 1 Vial Every 4 Hours As Needed Shortness of Breath 2)  Advair Diskus 250-50 Mcg/dose Aepb (Fluticasone-Salmeterol) .[name].. 1 Puff Two Times A Day 3)  Spiriva Handihaler 18 Mcg Caps (Tiotropium Bromide Monohydrate) .... Once Daily 4)  Ventolin Hfa 108 (90 Base) Mcg/act Aers (Albuterol Sulfate) .... 2 Puffs Four Times A Day As Needed 5)  Abilify 15 Mg Tabs (Aripiprazole) .[name].. 1 By Mouth Daily  Allergies (verified): 1)  ! Morphine 2)  ! Pcn  Vital Signs:  Patient profile:   45 year old female Height:      61 inches (154.94 cm) Weight:      153.25 pounds (69.66 kg) BMI:     29.06 O2 Sat:      95 % on Room air Temp:     98.5 degrees F (36.94 degrees C) oral Pulse rate:   110 / minute BP sitting:   140 / 78  (left arm) Cuff size:   regular  Vitals Entered By: Michel Bickers CMA (September 01, 2010 11:26 AM)  O2 Sat at Rest %:  95 O2 Flow:  Room air CC: COPD...excessive use of Ventolin...the patient c/o increased SOB with exertion and at rest x2 weeks...cough with brownish mucus x1 week...wheezing is worse at night...still smoking 1 pack per day...using her Ventolin and nebulizer every four hours Comments Medications reviewed with patient Daytime phone verified. Michel Bickers CMA  September 01, 2010 11:31 AM   Physical Exam  General:  debilitated woman, NAD Head:  normocephalic and  atraumatic Eyes:  conjunctiva and sclera clear Nose:  no deformity, discharge, inflammation, or lesions Mouth:  no deformity or lesions Neck:  trach scar well-healed Lungs:  bilat exp wheezes, harsh breath sounds Heart:  regular rate and rhythm, S1, S2 without murmurs, rubs, gallops, or clicks Abdomen:  not examined Extremities:  no clubbing, cyanosis, edema, or deformity noted Neurologic:  slightly ataxic, otherwise non-focal Skin:  intact without lesions or rashes Psych:  oriented x 3, appropriate   Impression & Recommendations:  Problem # 1:  COPD  (ICD-496) Finishing a pred taper now. Given her hx necrotizing PNA want to see CXR now - rx with avelox x 7 days - finish pred - ROV 10-14 days.  - need plan for tobacco cesation, discuss in detail next visit.   Problem # 2:  NARCOTIC ABUSE (ICD-305.90) - I'm not going to give her narcs or benzos. Stressed this to her today.   Medications Added to Medication List This Visit: 1)  Abilify 15 Mg Tabs (Aripiprazole) .[name].. 1 by mouth daily 2)  Avelox 400 Mg Tabs (Moxifloxacin hcl) .[name].. 1 by mouth once daily  Other Orders: Est. Patient Level IV (99214) T-2 View CXR (71020TC)  Patient Instructions: 1)  Take Avelox 400mg  by mouth once daily x 7 days 2)  Follow up with Dr Delton Coombes in 10-14 day.  Prescriptions: AVELOX 400 MG TABS (MOXIFLOXACIN HCL) 1 by mouth once daily  #7 x 0   Entered and Authorized by:   Leslye Peer MD   Signed by:   Leslye Peer MD on 09/01/2010   Method used:   Print then Give to Patient   RxID:   [id number]

## 2010-12-23 NOTE — Progress Notes (Signed)
  Phone Note Call from Patient   Caller: Patient Summary of Call: Call from [facility] [phone]) on 08/23/10 at 3PM> pt of DrByrum... requesting refill albuterol inhaler. She states that pharm thinks she goes thru these too quickly "but DrByrum lets me use 4sp every 4H as needed" & prev phone notes indicate that she's been using one inhaler Q2wks PLUS nebulized albuerol that was refilled several weeks ago as well...  Refill denied today, cautioned about overuse, asked to work on smoking cessation as a better way to help her breathing, she will f/u w/ her PCP & DrByrum next week...   Initial call taken by: Michele Mcalpine MD,  August 23, 2010 3:15 PM

## 2010-12-23 NOTE — Assessment & Plan Note (Signed)
Summary: COPD, tobacco   Visit Type:  Follow-up Primary Jillianne Gamino/Referring Carmine Youngberg:  [facility] Dept  CC:  COPD.  The patient says her breathing has improved but wheezing at night when lying down...less Ventolin and nebulizer use...still smoking 1 ppd..  History of Present Illness: 45 yo woman, heavy smoker, treated 1-2/11 for necrotic PNA and ARDS, resp failure. Also w hx substance abuse, narcotics use for back pain. Started on BD's.   D/C from hosp to SNF, but left AMA. Has been living with her mom. Taking percocet 15mg  1 -2 x a day (off the streets). Tells me that she is slowly recovering but feels she may be at a plateau. She can't stop smoking. Has freq cough with clear sputum. breathing OK. She does have BD's that she was on prior to her hospitalization. Advair + Spiriva, as needed ventolin.   ROV 04/16/10 -- f/u for COPD, admission for necrotic PNA and ARDS (s/p trach, decanulated). Still smoking 2 pk/day. Taking her BDs reliably. Having trouble with memory loss. Hasn't used THC since May. No exacerbations since last visit. Hasn't had PFT yet. Activity level has improved.   ROV 07/22/10 -- COPD and tobacco. Has cut down to 1 pk/day. Still using narcs off the street occas but planning for MRI and pain clinic appointment in [facility]. She feels better, breathing is better, has been walking every day. Advair + Spiriva.   Preventive Screening-Counseling & Management  Alcohol-Tobacco     Smoking Status: current     Smoking Cessation Counseling: yes     Smoke Cessation Stage: contemplative     Packs/Day: 1.0  Current Medications (verified): 1)  Albuterol Sulfate (2.5 Mg/39ml) 0.083% Nebu (Albuterol Sulfate) .[name].. 1 Vial Four Times A Day As Needed 2)  Advair Diskus 250-50 Mcg/dose Aepb (Fluticasone-Salmeterol) .[name].. 1 Puff Two Times A Day 3)  Spiriva Handihaler 18 Mcg Caps (Tiotropium Bromide Monohydrate) .... Once Daily 4)  Ventolin Hfa 108 (90 Base) Mcg/act Aers (Albuterol  Sulfate) .... 2 Puffs Four Times A Day As Needed 5)  Abilify 10 Mg Tabs (Aripiprazole) .[name].. 1 By Mouth Two Times A Day  Allergies (verified): 1)  ! Morphine 2)  ! Pcn  Social History: Packs/Day:  1.0  Vital Signs:  Patient profile:   45 year old female Height:      61 inches (154.94 cm) Weight:      145.38 pounds (66.08 kg) BMI:     27.57 O2 Sat:      96 % on Room air Temp:     98.1 degrees F (36.72 degrees C) oral Pulse rate:   97 / minute BP sitting:   122 / 80  (right arm) Cuff size:   regular  Vitals Entered By: Michel Bickers CMA (July 22, 2010 2:24 PM)  O2 Sat at Rest %:  96 O2 Flow:  Room air CC: COPD.  The patient says her breathing has improved but wheezing at night when lying down...less Ventolin and nebulizer use...still smoking 1 ppd. Comments Medications reviewed with the patient. Daytime phone verified. Michel Bickers CMA  July 22, 2010 2:30 PM   Physical Exam  General:  debilitated woman, NAD Head:  normocephalic and atraumatic Eyes:  conjunctiva and sclera clear Nose:  no deformity, discharge, inflammation, or lesions Mouth:  no deformity or lesions Neck:  trach scar well-healed Lungs:  bilat exp wheezes, improved compared with last visit Heart:  regular rate and rhythm, S1, S2 without murmurs, rubs, gallops, or clicks Abdomen:  not  examined Extremities:  no clubbing, cyanosis, edema, or deformity noted Neurologic:  slightly ataxic, otherwise non-focal Skin:  intact without lesions or rashes Psych:  oriented x 3, appropriate   Impression & Recommendations:  Problem # 1:  COPD (ICD-496) - full PFt - set goal for cigarettes at 1/2 pk / day by next time - Advair + Spiriva + alb  Problem # 2:  NARCOTIC ABUSE (ICD-305.90) Planning to go to pain clinic in [place]  Medications Added to Medication List This Visit: 1)  Albuterol Sulfate (2.5 Mg/8ml) 0.083% Nebu (Albuterol sulfate) .[name].. 1 vial four times a day as needed  Other Orders: Est.  Patient Level IV (54098) Tobacco use cessation intensive >10 minutes (11914)  Patient Instructions: 1)  Congratulations on decreasing your cigarettes. Our next goal will be for you to cut down to 1/2 pack per day. Try to get to this by our next appointment 2)  Continue your Spiriva and Advair as you are taking them.  3)  Use Ventolin and albuterol nebs as needed.  4)  We will perform full pulmonary function testing at your next visit.  5)  Follow up with Dr Delton Coombes in 3 months or as needed

## 2010-12-23 NOTE — Progress Notes (Signed)
Summary: albuterol rx  Phone Note Call from Patient   Caller: Patient Call For: byrum Summary of Call: pt requests a fax to [employer] in [place] for [employer] for [employer]. pt ph# [phone] Initial call taken by: Tivis Ringer, CNA,  August 01, 2010 10:00 AM  Follow-up for Phone Call        Pt requesting refill on albuterol and atrovent nebs. Atrovent was removed frompt med list. Do you want the pt to continue this medication? Please advsie. Carron Curie CMA  August 01, 2010 10:17 AM  We can refill the albuterol 2.5mg  nebs q4h as needed SOB, but not the atrovent as long as she's on Spiriva. Leslye Peer MD  August 01, 2010 2:27 PM   Follow-up by: Leslye Peer MD,  August 01, 2010 2:27 PM  Additional Follow-up for Phone Call Additional follow up Details #1::        [facility] Gweneth Dimitri RN  August 01, 2010 2:35 PM  PT RETURNED CALL FROM TRIAGE. Tivis Ringer, CNA  August 01, 2010 4:22 PM  Spoke with pt.  Pt informed albuterol neb sent to [facility] but she does not need Atrovent if she is on spiriva per RB.  She verbalzied understanding. Additional Follow-up by: Gweneth Dimitri RN,  August 01, 2010 4:27 PM    New/Updated Medications: ALBUTEROL SULFATE (2.5 MG/3ML) 0.083% NEBU (ALBUTEROL SULFATE) 1 vial every 4 hours as needed shortness of breath Prescriptions: ALBUTEROL SULFATE (2.5 MG/3ML) 0.083% NEBU (ALBUTEROL SULFATE) 1 vial every 4 hours as needed shortness of breath  #120 x 3   Entered by:   Gweneth Dimitri RN   Authorized by:   Leslye Peer MD   Signed by:   Vernie Murders on 08/01/2010   Method used:   Electronically to        [facility] (retail)       [address] [place], [place]  [address]       Ph: [phone]       Fax: [phone]   RxID:   [id number]

## 2010-12-24 ENCOUNTER — Ambulatory Visit (HOSPITAL_COMMUNITY)
Admission: RE | Admit: 2010-12-24 | Discharge: 2010-12-24 | Disposition: A | Discharge status: Home / Self Care | Payer: Medicaid Other | Attending: Obstetrics & Gynecology | Admitting: Obstetrics & Gynecology

## 2010-12-25 NOTE — Progress Notes (Signed)
Summary: nos appt  Phone Note Call from Patient   Caller: [email or web address]  Call For: Taneil Lazarus Summary of Call: Rsc nos from 1/25 to 2/20. Initial call taken by: Darletta Moll,  December 18, 2010 9:36 AM

## 2011-01-01 IMAGING — CR DG CHEST 1V PORT
1 series · 1 of 1 positions shown · non-contrast
Comparison: Chest x-ray of 08/12/2009

CLINICAL DATA: Cough, congestion, shortness of breath

PORTABLE CHEST - 1 VIEW

[view not recorded]
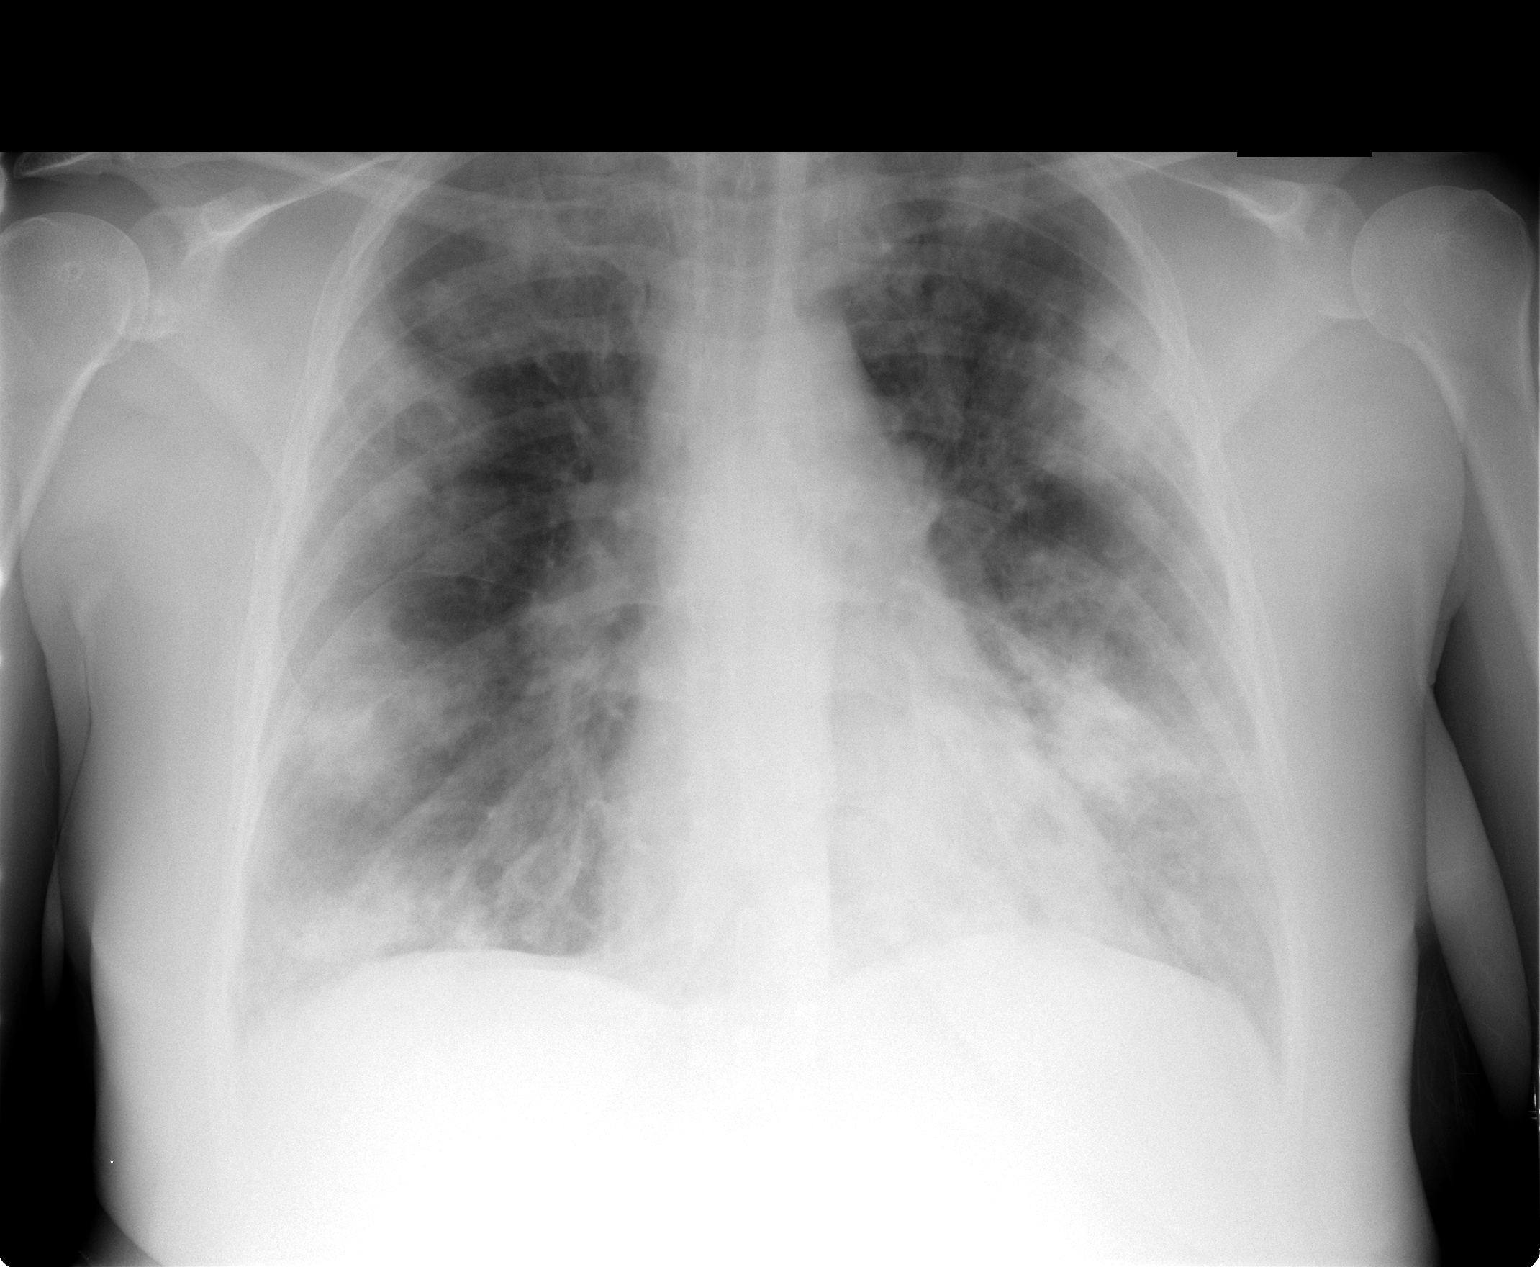

[1 of 1 positions shown; findings below may reference images not displayed]

FINDINGS: There is patchy airspace disease bilaterally most
consistent with pneumonia.  No effusion is seen.  The heart is
within normal limits in size.
IMPRESSION: Patchy bilateral airspace disease most consistent pneumonia.
Recommend follow-up to ensure clearing.

## 2011-01-02 IMAGING — CT CT CHEST W/ CM
2 of 6 series · 12 of 36 positions shown, 15 images · IV contrast (agent unspecified)
Comparison: None

CLINICAL DATA: COPD.  Pneumonia.  Hemoptysis.

CT CHEST WITH CONTRAST
TECHNIQUE: Multidetector CT imaging of the chest was performed
following the standard protocol during bolus administration of
intravenous contrast.
Contrast: 80 ml of omni 300

[Series 2: chest with st · axial · 0.79mm/px · z∈[+1472,+1712]mm · 9 of 61 slices shown, 12 images]
[im 7/61  mediastinal]
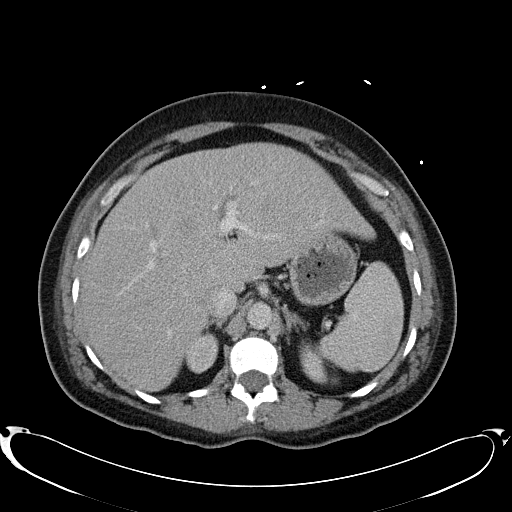
[im 7/61  lung]
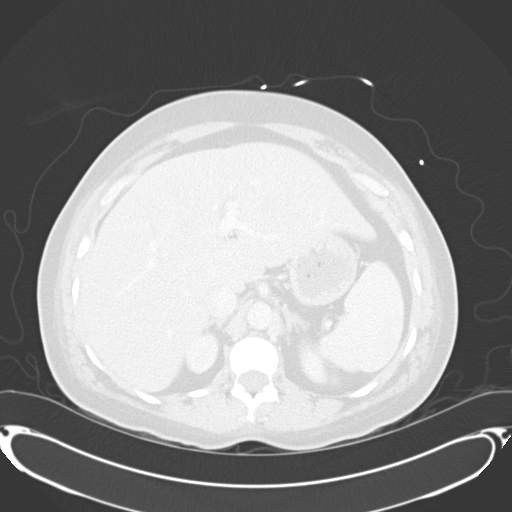
[im 13/61  lung]
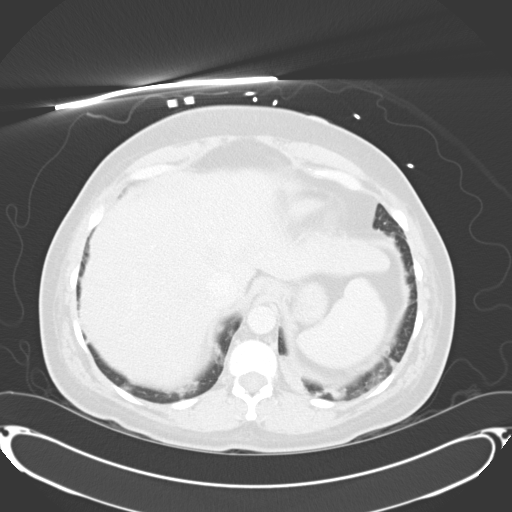
[im 19/61  lung]
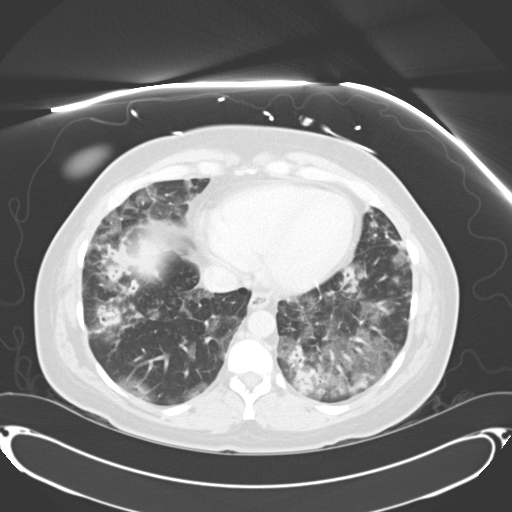
[im 25/61  lung]
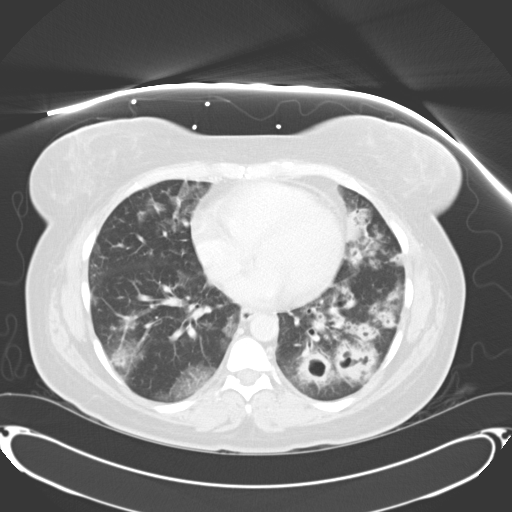
[im 31/61  mediastinal]
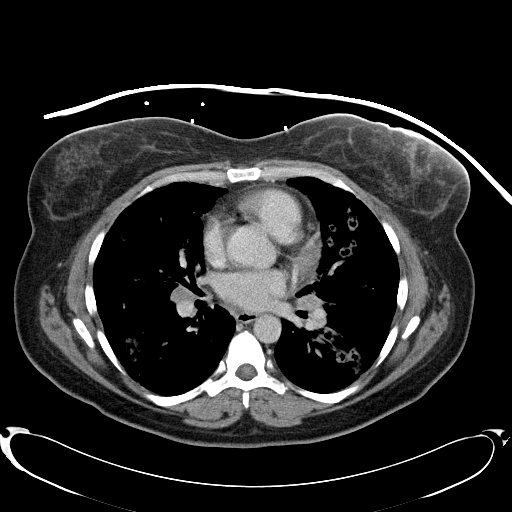
[im 31/61  lung]
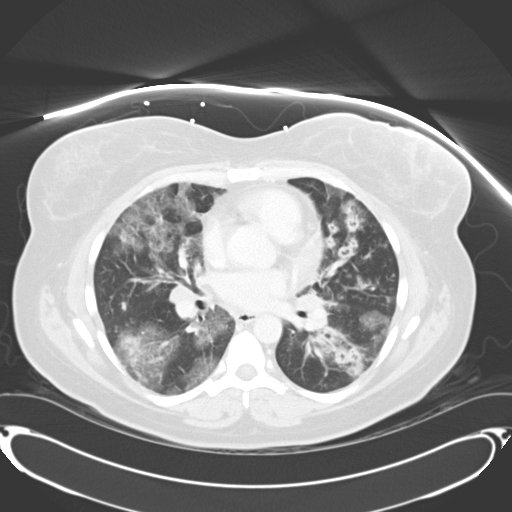
[im 37/61  lung]
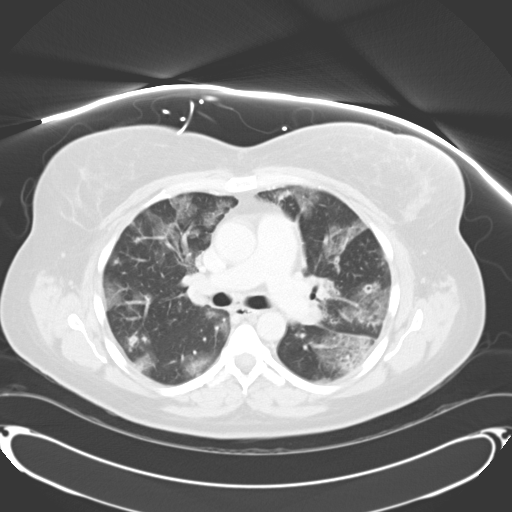
[im 43/61  lung]
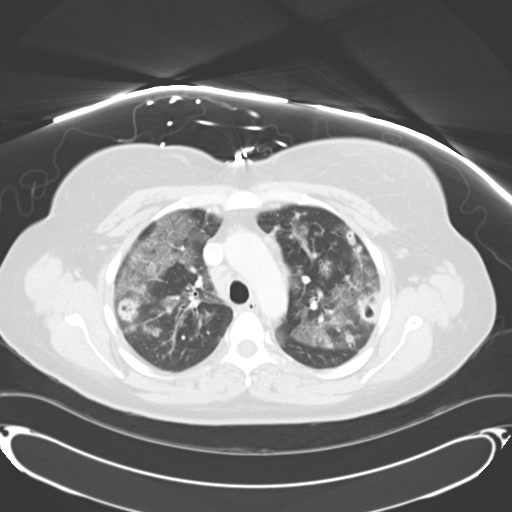
[im 49/61  lung]
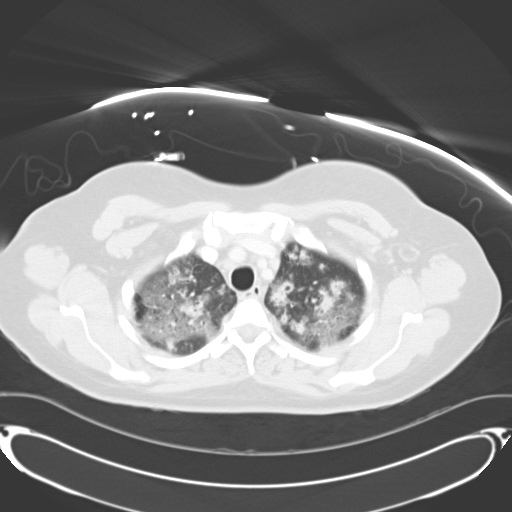
[im 55/61  mediastinal]
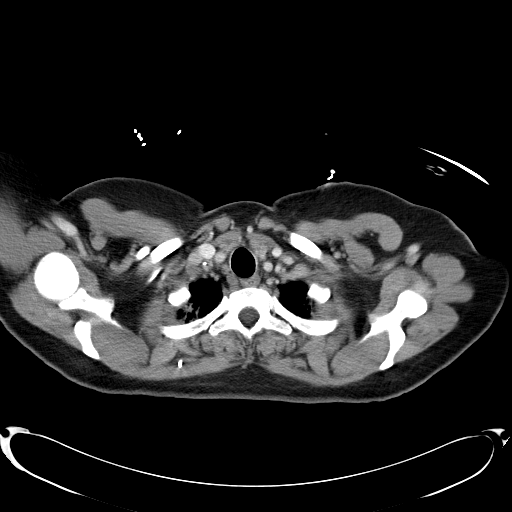
[im 55/61  lung]
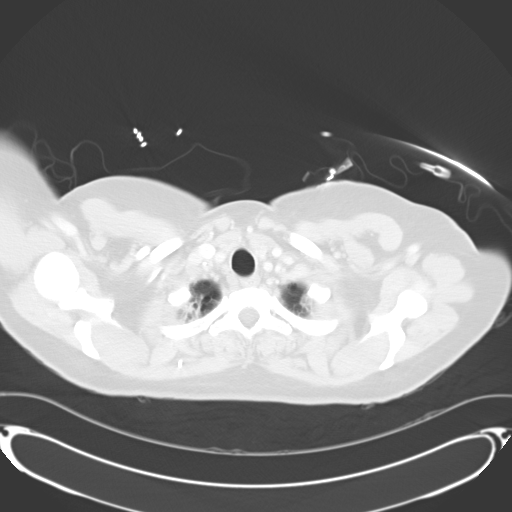

[Series 602: <mpr thick range> · coronal · 0.79mm/px · 3 of 87 slices shown]
[im 18/87  lung]
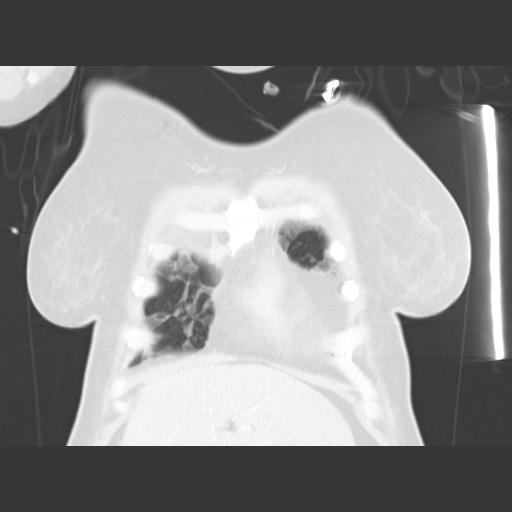
[im 35/87  lung]
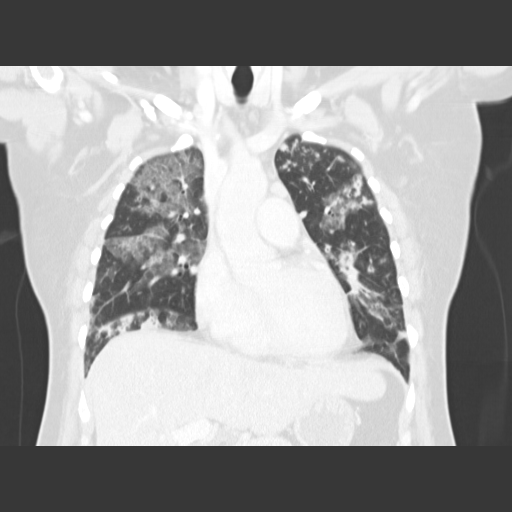
[im 52/87  lung]
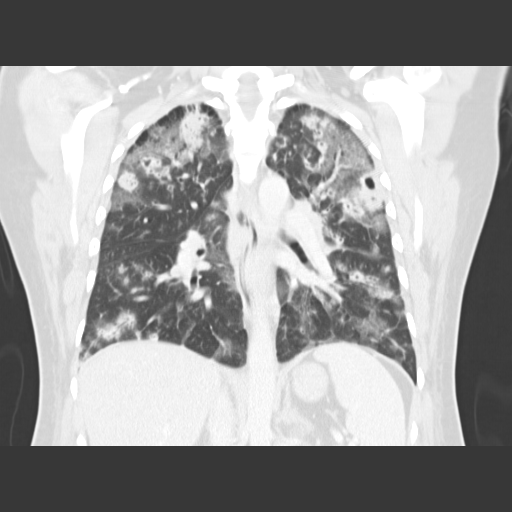

[12 of 36 positions shown; findings below may reference images not displayed]

FINDINGS: No enlarged axillary or supraclavicular lymph nodes.

Prevascular lymph node measures 1.3 cm, image 21.  The right
paratracheal lymph node measures 1.5 cm, image 21.  Enlarged
bilateral hilar lymph nodes are identified.  A left hilar lymph
node measures 1.4 cm.

No pericardial or pleural effusions.

There are multifocal bilateral ground-glass densities.  Multifocal
cavitary nodules and cavitary consolidation is also identified
bilaterally.  For example, within the right lower lobe there is a
cavitary mass measuring 3.2 cm.  Within the left lower lobe
cavitary mass measures 2.0 cm, image 38.

The adrenal glands are normal.

Limited imaging through the upper abdomen shows no mass or acute
findings.
IMPRESSION: 1.  Multifocal, bilateral ground-glass densities and airspace
consolidation containing areas of cavitation.  Findings are most
likely due to bacterial pneumonia with cavitation and multifocal
abscess formation.  The differential considerations include
atypical infection and fungal etiologies. Multifocal necrotic
metastasis is considered less favored.  Follow-up imaging to ensure
clearing is recommended.
2.  Enlarged mediastinal and hilar lymph nodes.  Likely reactive in
etiology.  Differential considerations include metastatic disease
or lymphoma.

## 2011-01-04 IMAGING — CR DG CHEST 1V PORT
1 series · 1 of 1 positions shown · non-contrast
Comparison: 11/28/2009

CLINICAL DATA: ET tube placement

PORTABLE CHEST - 1 VIEW

[view not recorded]
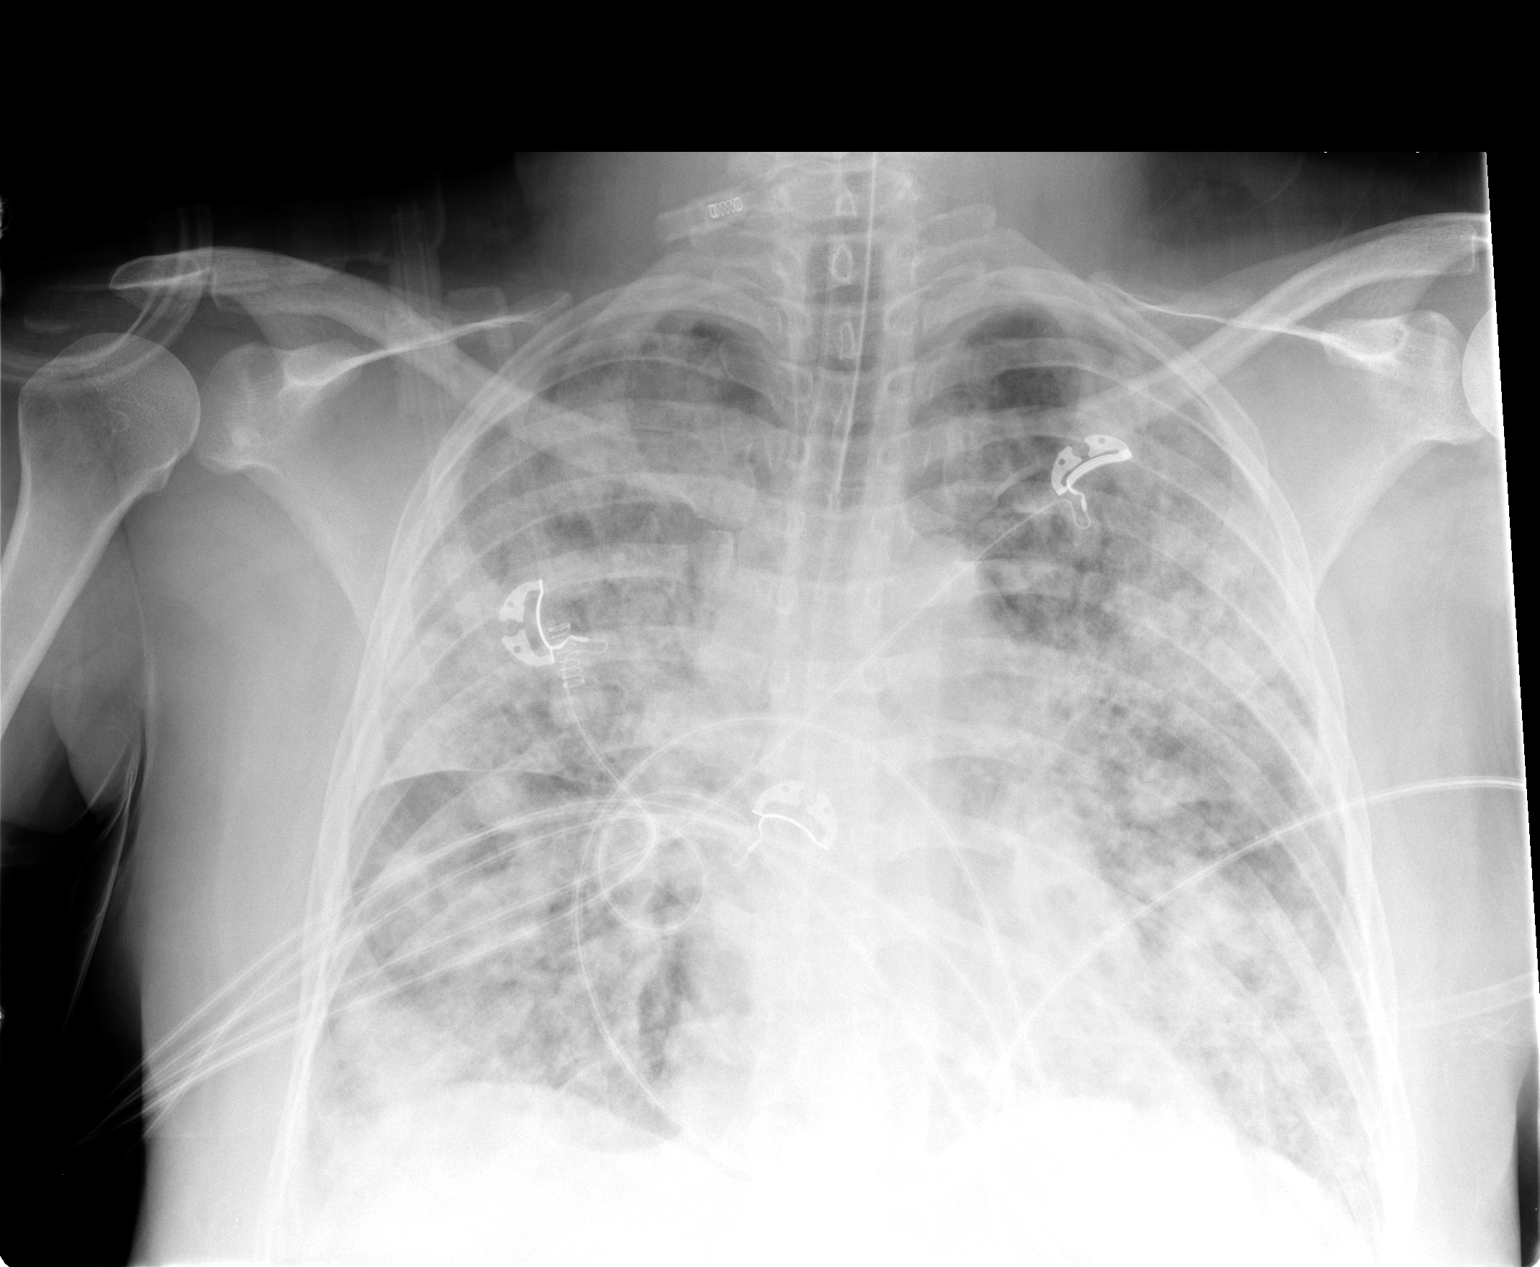

[1 of 1 positions shown; findings below may reference images not displayed]

FINDINGS: Endotracheal tube has been inserted and lies 5 cm above
the carina.  There is worsening in multifocal airspace disease and
cavitary nodules when compared with yesterday's exam.  I see no
effusion or pneumothorax.
IMPRESSION: ET tube good position.

Worsening aeration.

## 2011-01-06 IMAGING — CR DG CHEST 1V PORT
1 series · 1 of 1 positions shown · non-contrast
Comparison: Yesterday

CLINICAL DATA: Pneumonia

PORTABLE CHEST - 1 VIEW

[view not recorded]
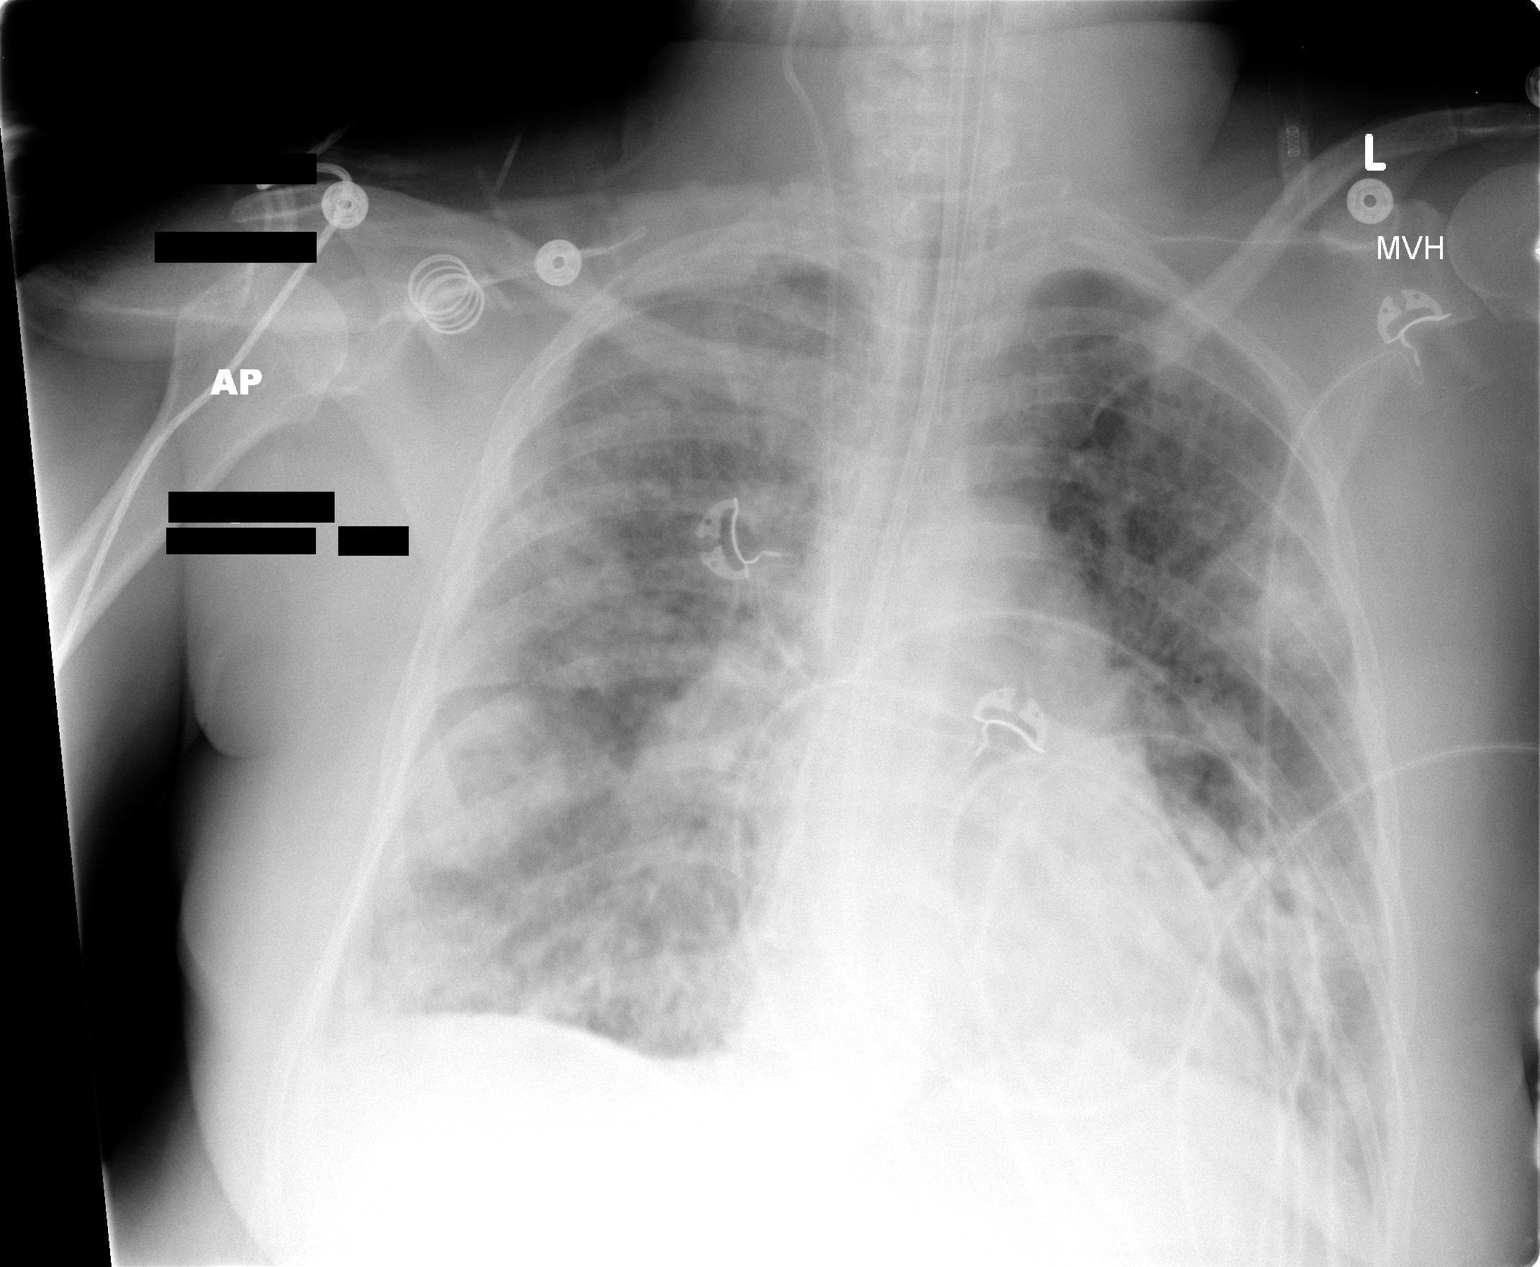

[1 of 1 positions shown; findings below may reference images not displayed]

FINDINGS: Stable tubular devices.  Normal heart size.  Extensive
bilateral airspace disease has improved.  No pneumothorax.
IMPRESSION: Improving airspace disease.

## 2011-01-07 IMAGING — CR DG CHEST 1V PORT
1 series · 1 of 1 positions shown · non-contrast
Comparison: 12/01/2009

CLINICAL DATA: Low O2 sats.

PORTABLE CHEST - 1 VIEW

[view not recorded]
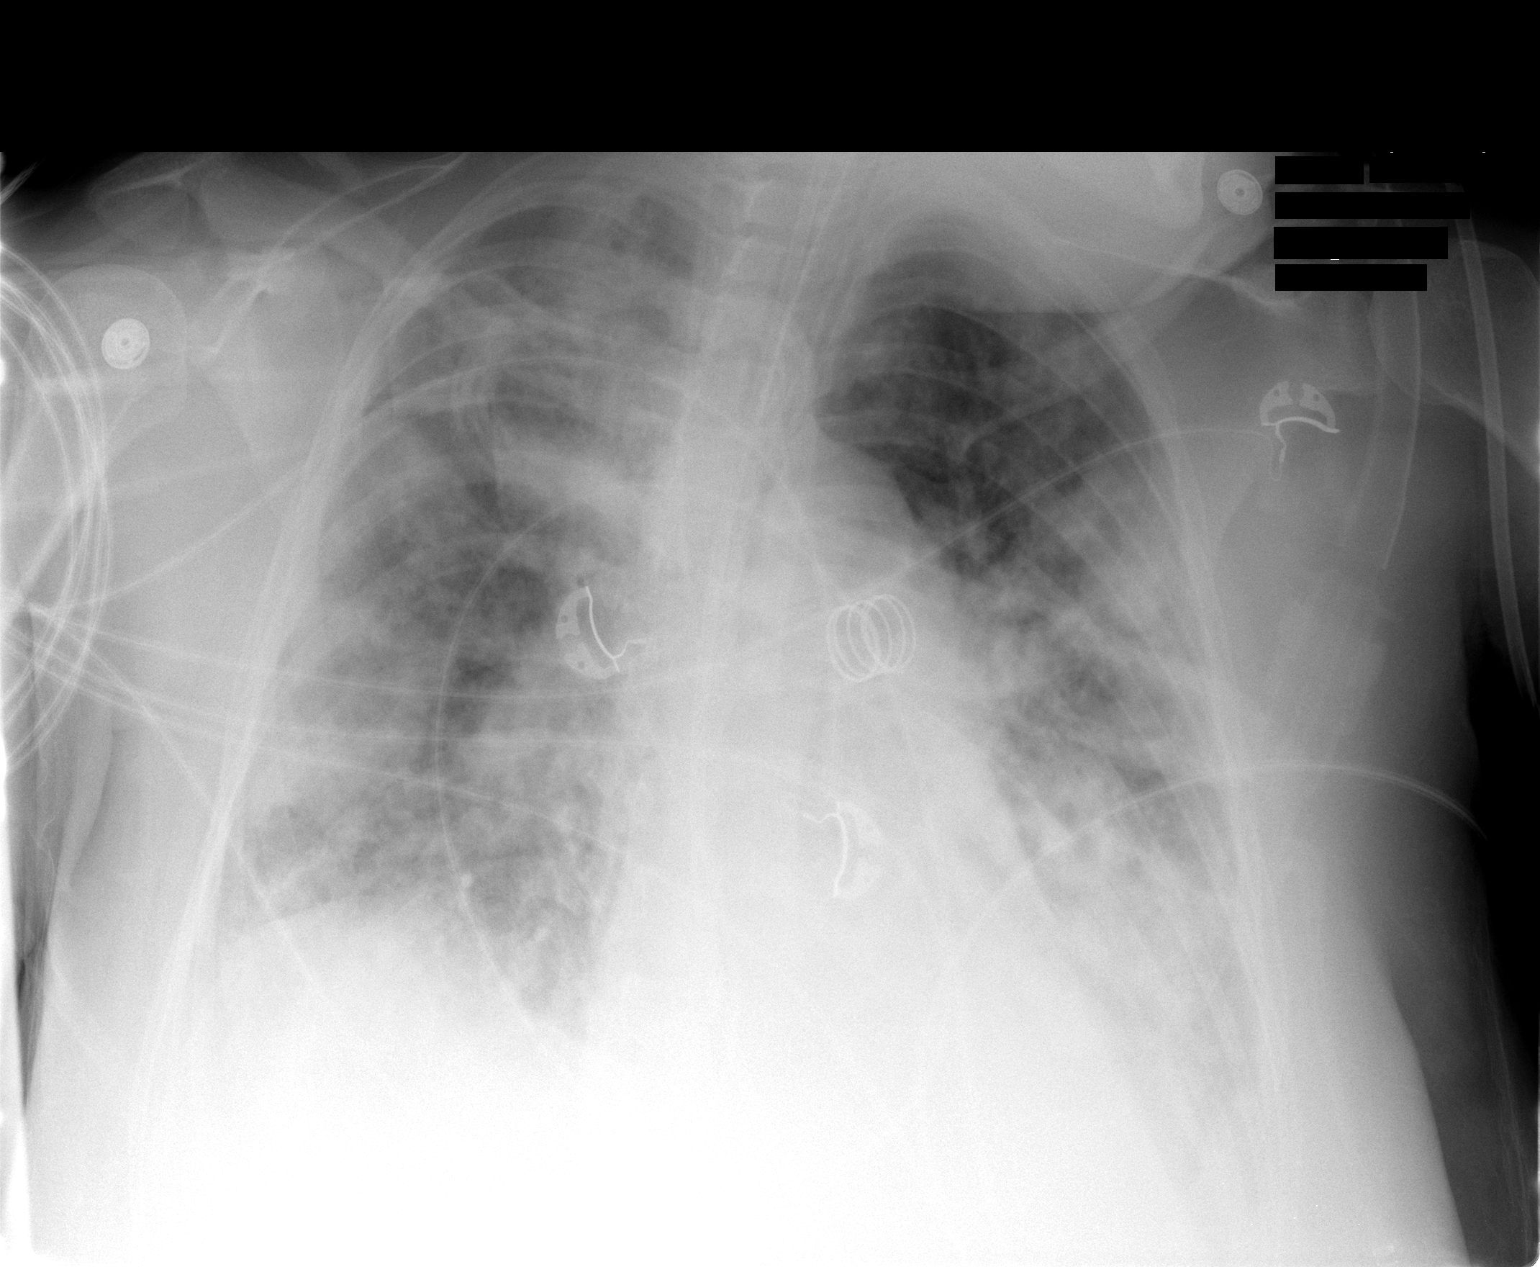

[1 of 1 positions shown; findings below may reference images not displayed]

FINDINGS: Bilateral airspace disease again noted, stable or
slightly increased since prior study.  Support devices are in
stable position.  Heart is upper limits normal in size.  Possible
small effusions.
IMPRESSION: Slight increase in diffuse bilateral airspace disease.

Question small effusions.

## 2011-01-08 IMAGING — CR DG CHEST 1V PORT
1 series · 1 of 1 positions shown · non-contrast
Comparison: 12/02/2009

CLINICAL DATA: Pneumonia.

PORTABLE CHEST - 1 VIEW

[view not recorded]
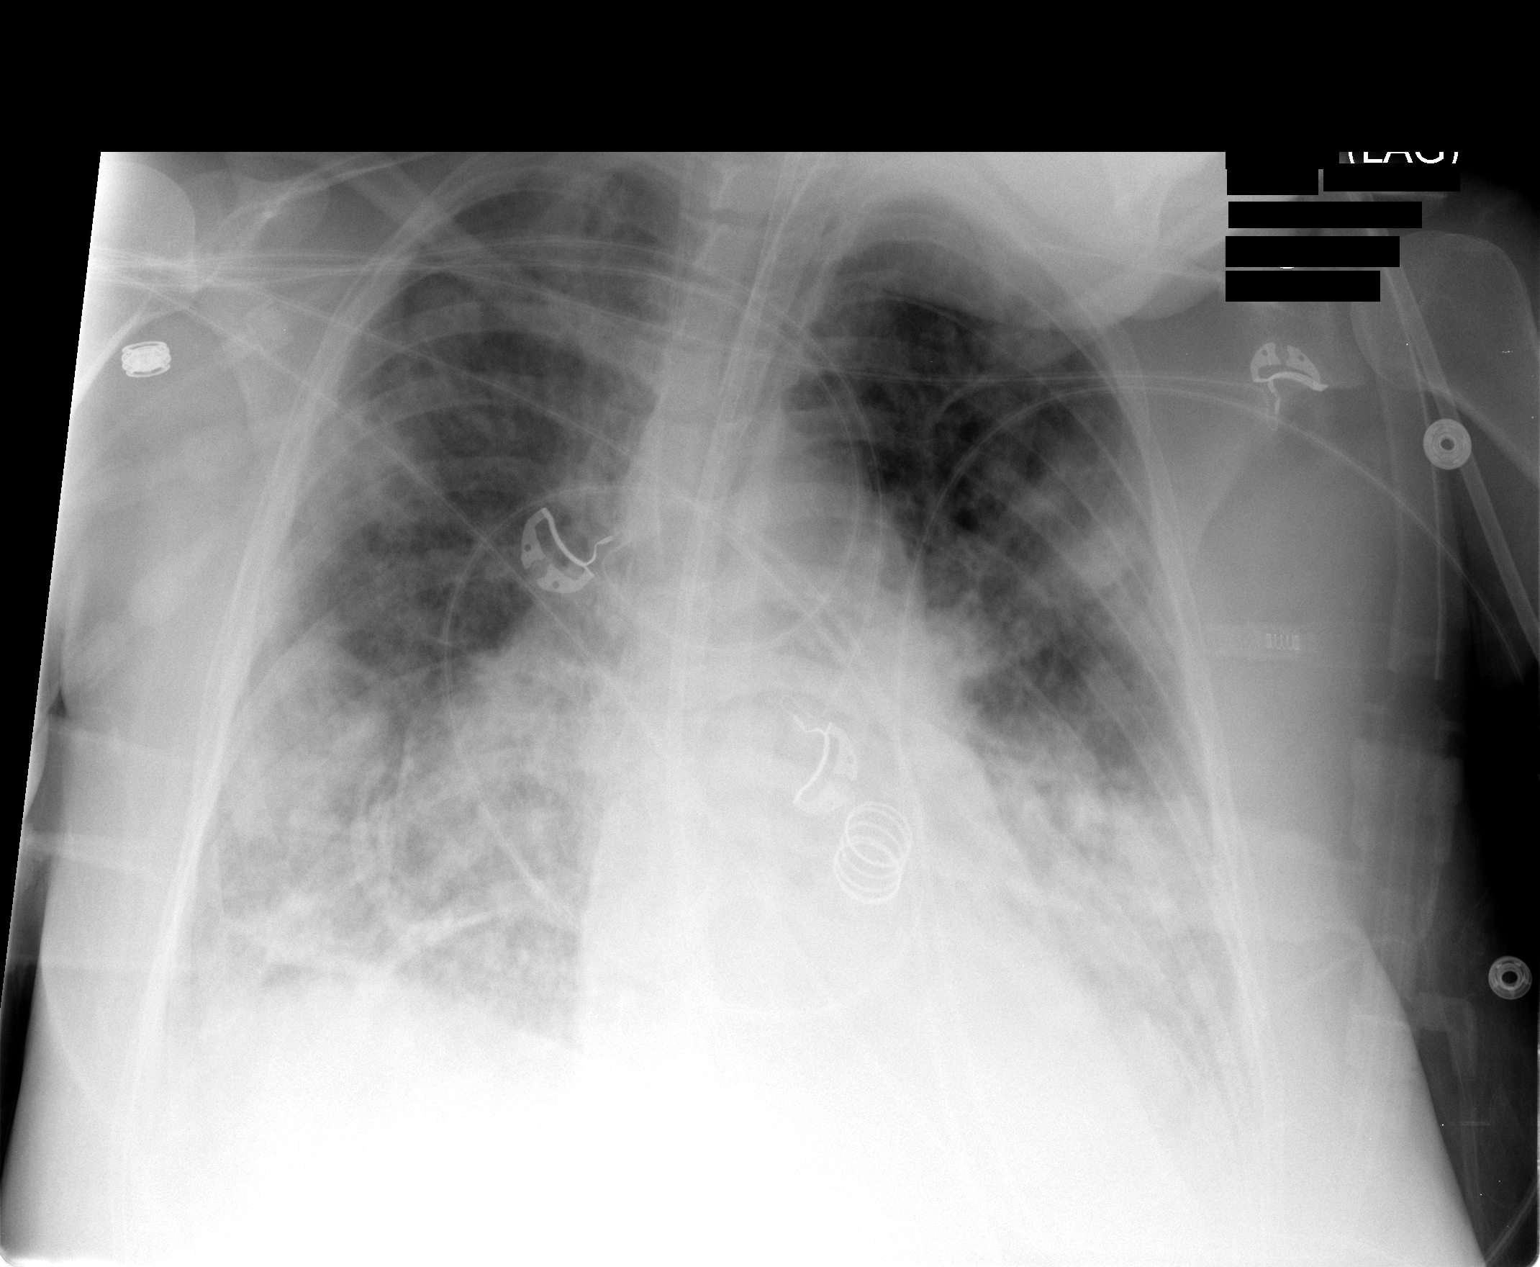

[1 of 1 positions shown; findings below may reference images not displayed]

FINDINGS: Severe patchy bilateral airspace disease again seen,
unchanged.  Heart is normal size.  Possible small effusions.
Support devices are stable.
IMPRESSION: No significant change.

## 2011-01-09 IMAGING — CR DG CHEST 1V PORT
1 series · 1 of 1 positions shown · non-contrast
Comparison: 12/03/2009

CLINICAL DATA: Ventilator.  Respiratory failure.

PORTABLE CHEST - 1 VIEW

[view not recorded]
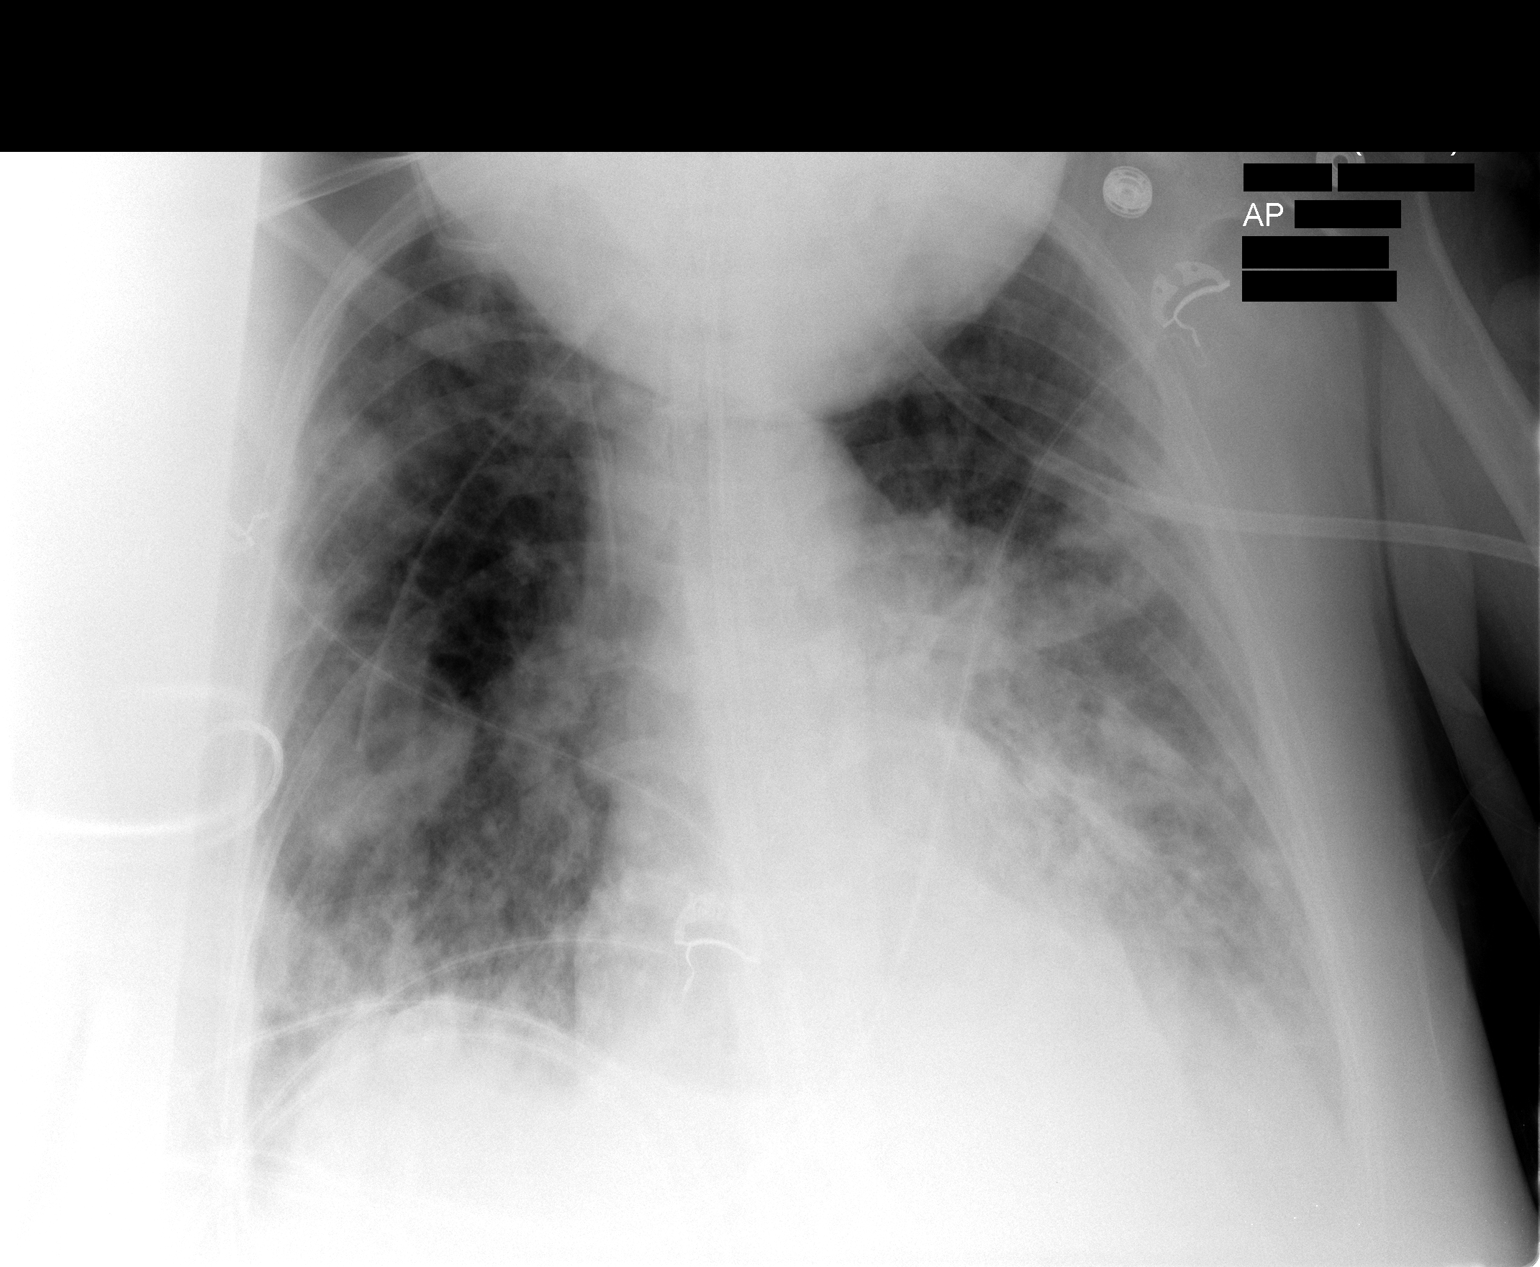

[1 of 1 positions shown; findings below may reference images not displayed]

FINDINGS: Bilateral airspace opacities are again noted, many which
are mass-like and cavitated as seen on prior chest CT.  Support
devices are stable.  No real change since prior study.
IMPRESSION: No significant change.

## 2011-01-11 IMAGING — CR DG CHEST 1V PORT
1 series · 1 of 1 positions shown · non-contrast
Comparison: Chest radiograph 12/04/2009 the

CLINICAL DATA: Pneumonia

PORTABLE CHEST - 1 VIEW

[view not recorded]
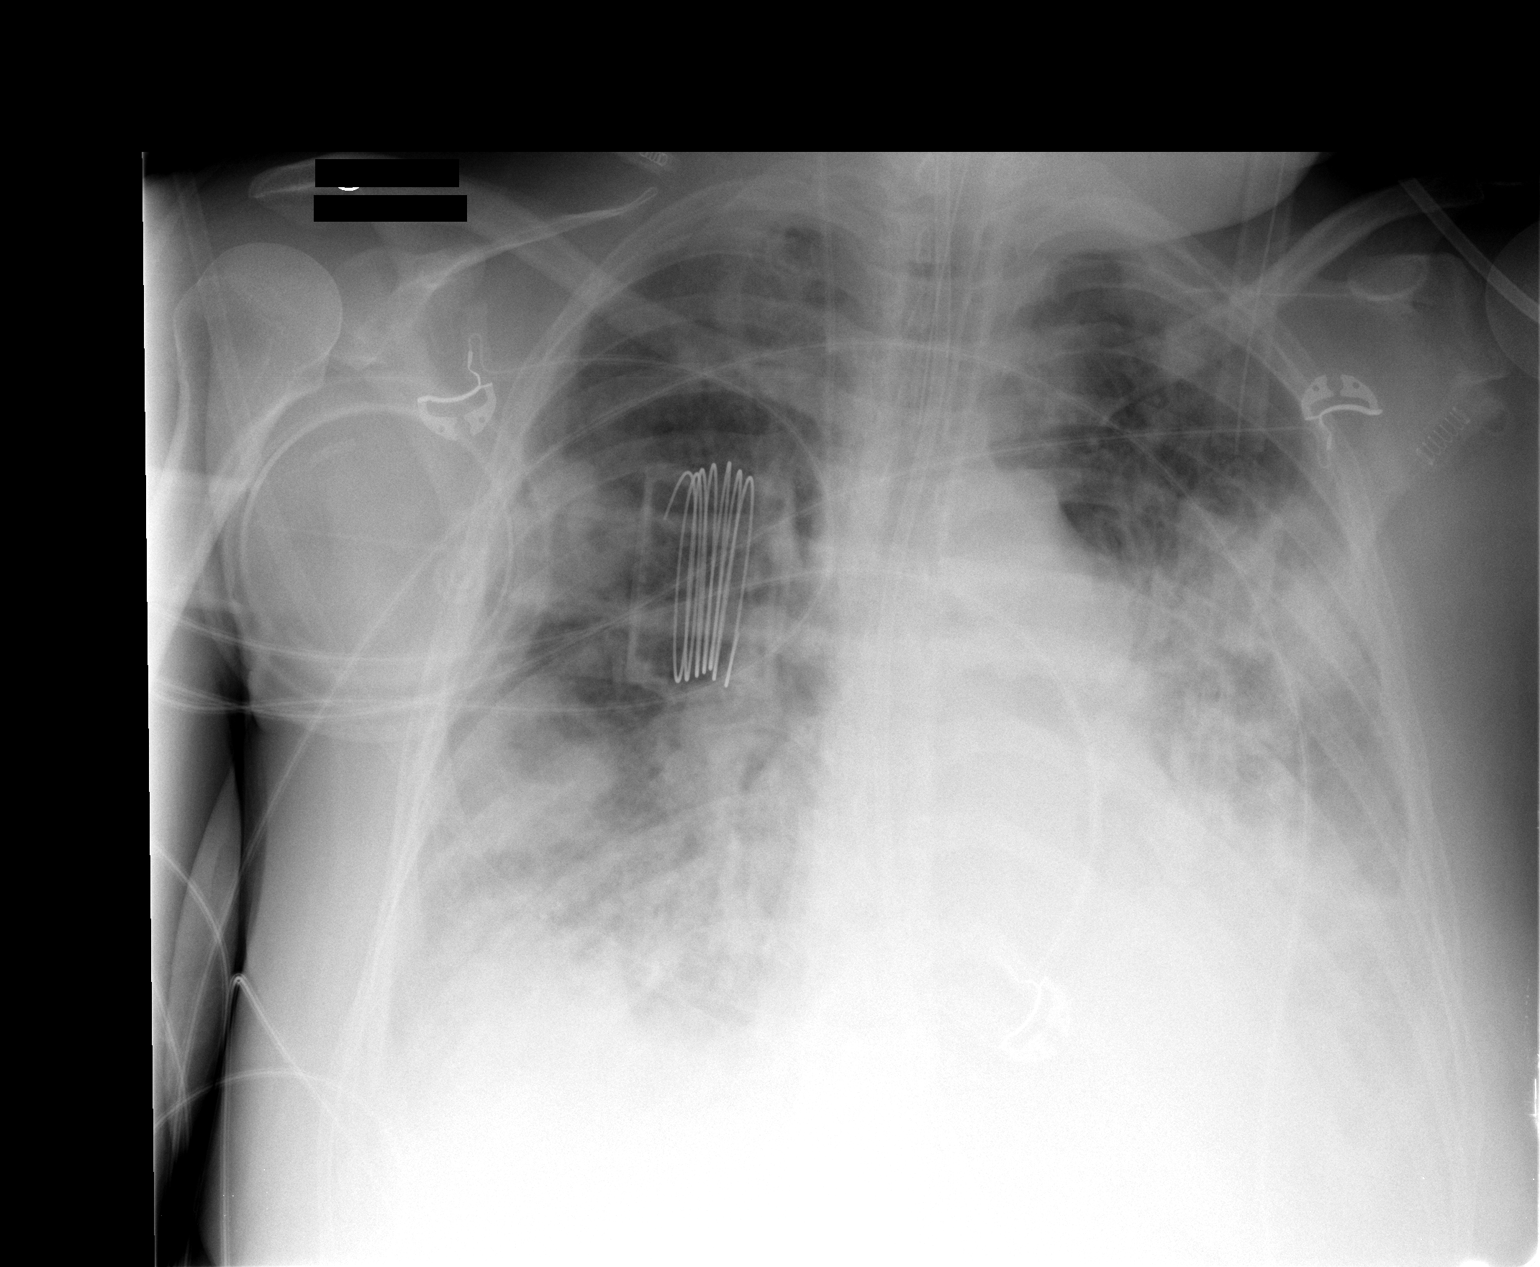

[1 of 1 positions shown; findings below may reference images not displayed]

FINDINGS: Feeding tube, endotracheal tube and right central venous
line are unchanged.  Stable enlarged cardiac silhouette.  There is
a patchy bilateral air space disease which is not improved in the
interval.  Bilateral pleural effusions may be slightly increased.
IMPRESSION: 1.  Stable support apparatus.
2.  No improvement in multifocal pneumonia, effusions, and
potential pulmonary edema.

## 2011-01-11 IMAGING — CR DG CHEST 1V PORT
1 series · 1 of 1 positions shown · non-contrast
Comparison: Film at 7137 hours

CLINICAL DATA: Pneumonia and respiratory failure.  Status post
central line placement.

PORTABLE CHEST - 1 VIEW

[view not recorded]
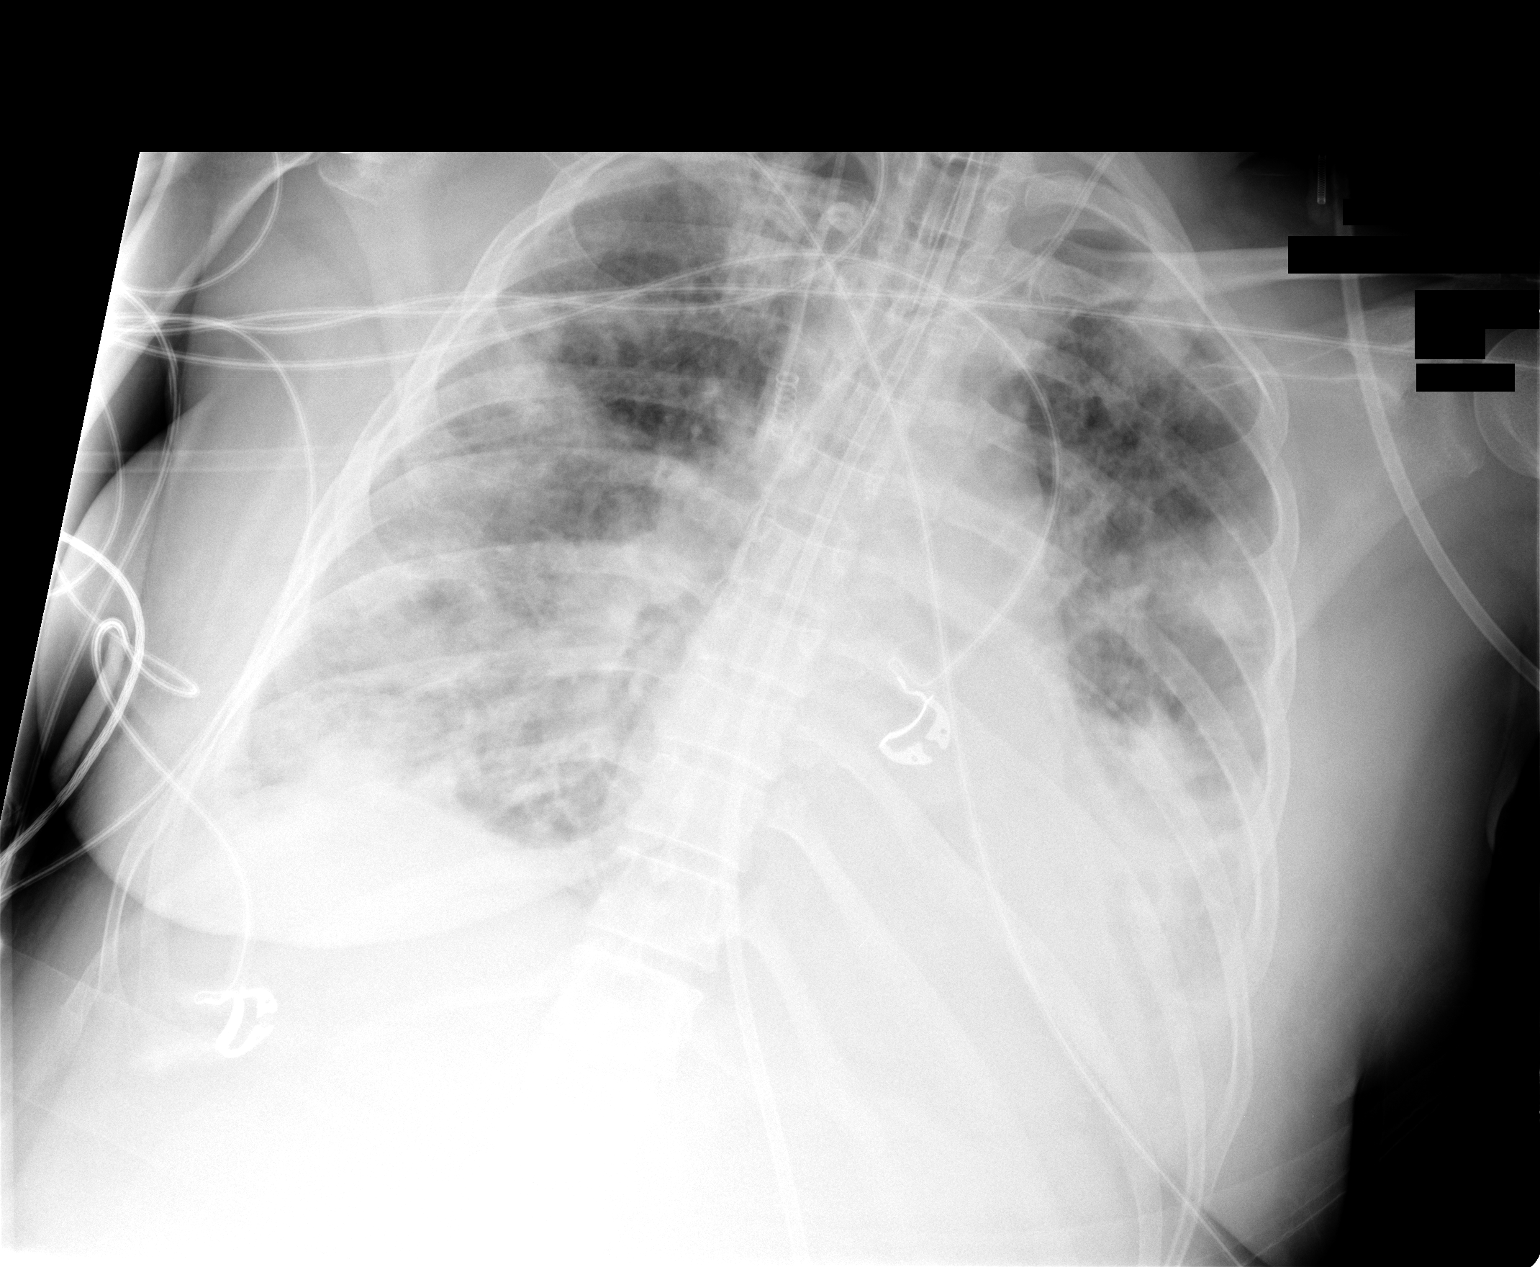

[1 of 1 positions shown; findings below may reference images not displayed]

FINDINGS: Film at 2778 hours shows placement of a left jugular
central line.  The tip lies in the lower SVC.  No pneumothorax is
evident following the procedure.  Coarse bilateral pulmonary
airspace disease is stable since the prior film.  There is stable
positioning of endotracheal tube with the tip lying approximately 4
cm above the carina.
IMPRESSION: New left jugular central line tip lies in the lower SVC.  There is
no pneumothorax after the procedure.

## 2011-01-12 ENCOUNTER — Inpatient Hospital Stay: Payer: Self-pay | Admitting: Emergency Medicine

## 2011-01-12 IMAGING — CR DG CHEST 1V PORT
1 series · 1 of 1 positions shown · non-contrast
Comparison: 12/06/2009 and earlier.

CLINICAL DATA: 43-year-old female with pneumonia and respiratory
distress.

PORTABLE CHEST - 1 VIEW

[view not recorded]
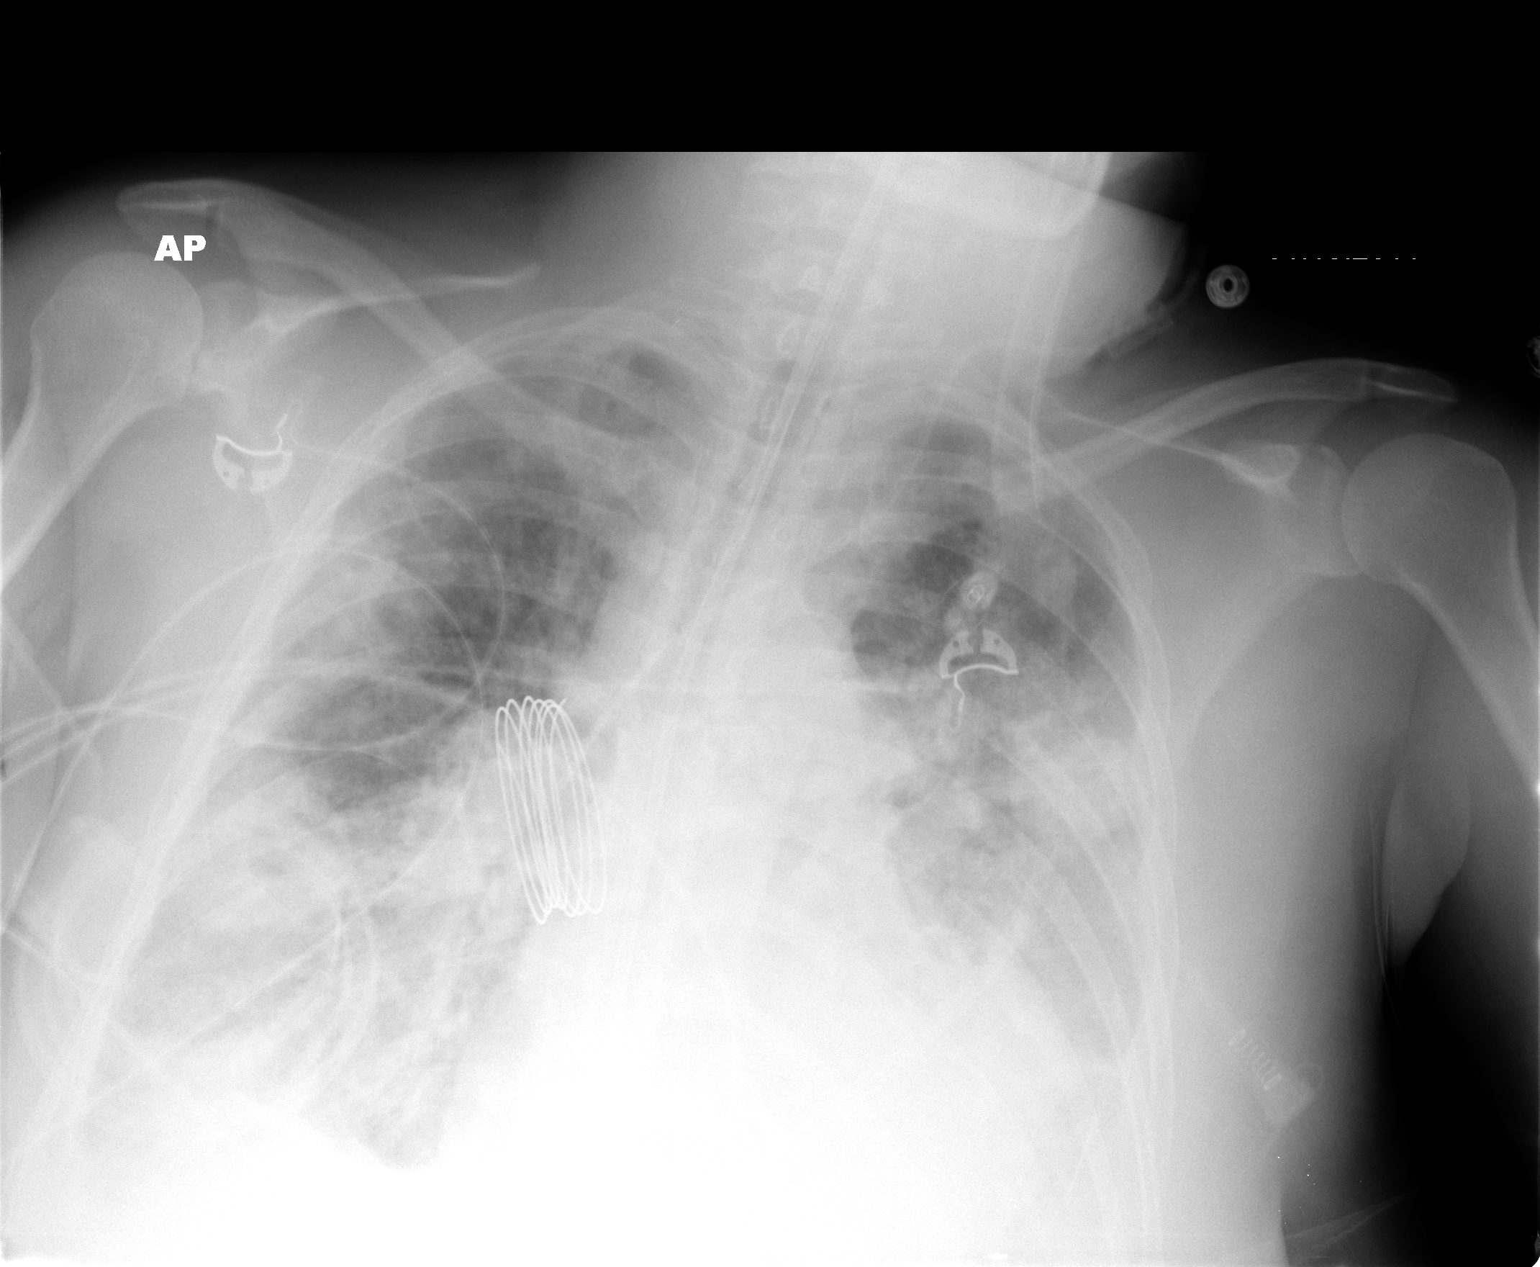

[1 of 1 positions shown; findings below may reference images not displayed]

FINDINGS: Portable AP view at 3973 hours.  Motion artifact.  Right
IJ catheters been removed.  Stable left IJ catheter.  Feeding tube
in place.  Endotracheal tube not well visualized, tip probably
stable at the level of the clavicles.

 Stable cardiac size and mediastinal contours.  Ongoing left lower
lobe collapse or consolidation.  Stable superimposed patchy diffuse
bilateral pulmonary opacity elsewhere.  No pneumothorax.  Probable
left pleural effusion.
IMPRESSION: 1. Stable lines and tubes.
2.  No interval change.  Left lower lobe collapse / consolidation,
superimposed patchy diffuse pulmonary opacity, and left pleural
effusion.

## 2011-01-13 IMAGING — CR DG CHEST 1V PORT
1 series · 1 of 1 positions shown · non-contrast
Comparison: 12/07/2009

CLINICAL DATA: Pneumonia and respiratory failure.

PORTABLE CHEST - 1 VIEW

[view not recorded]
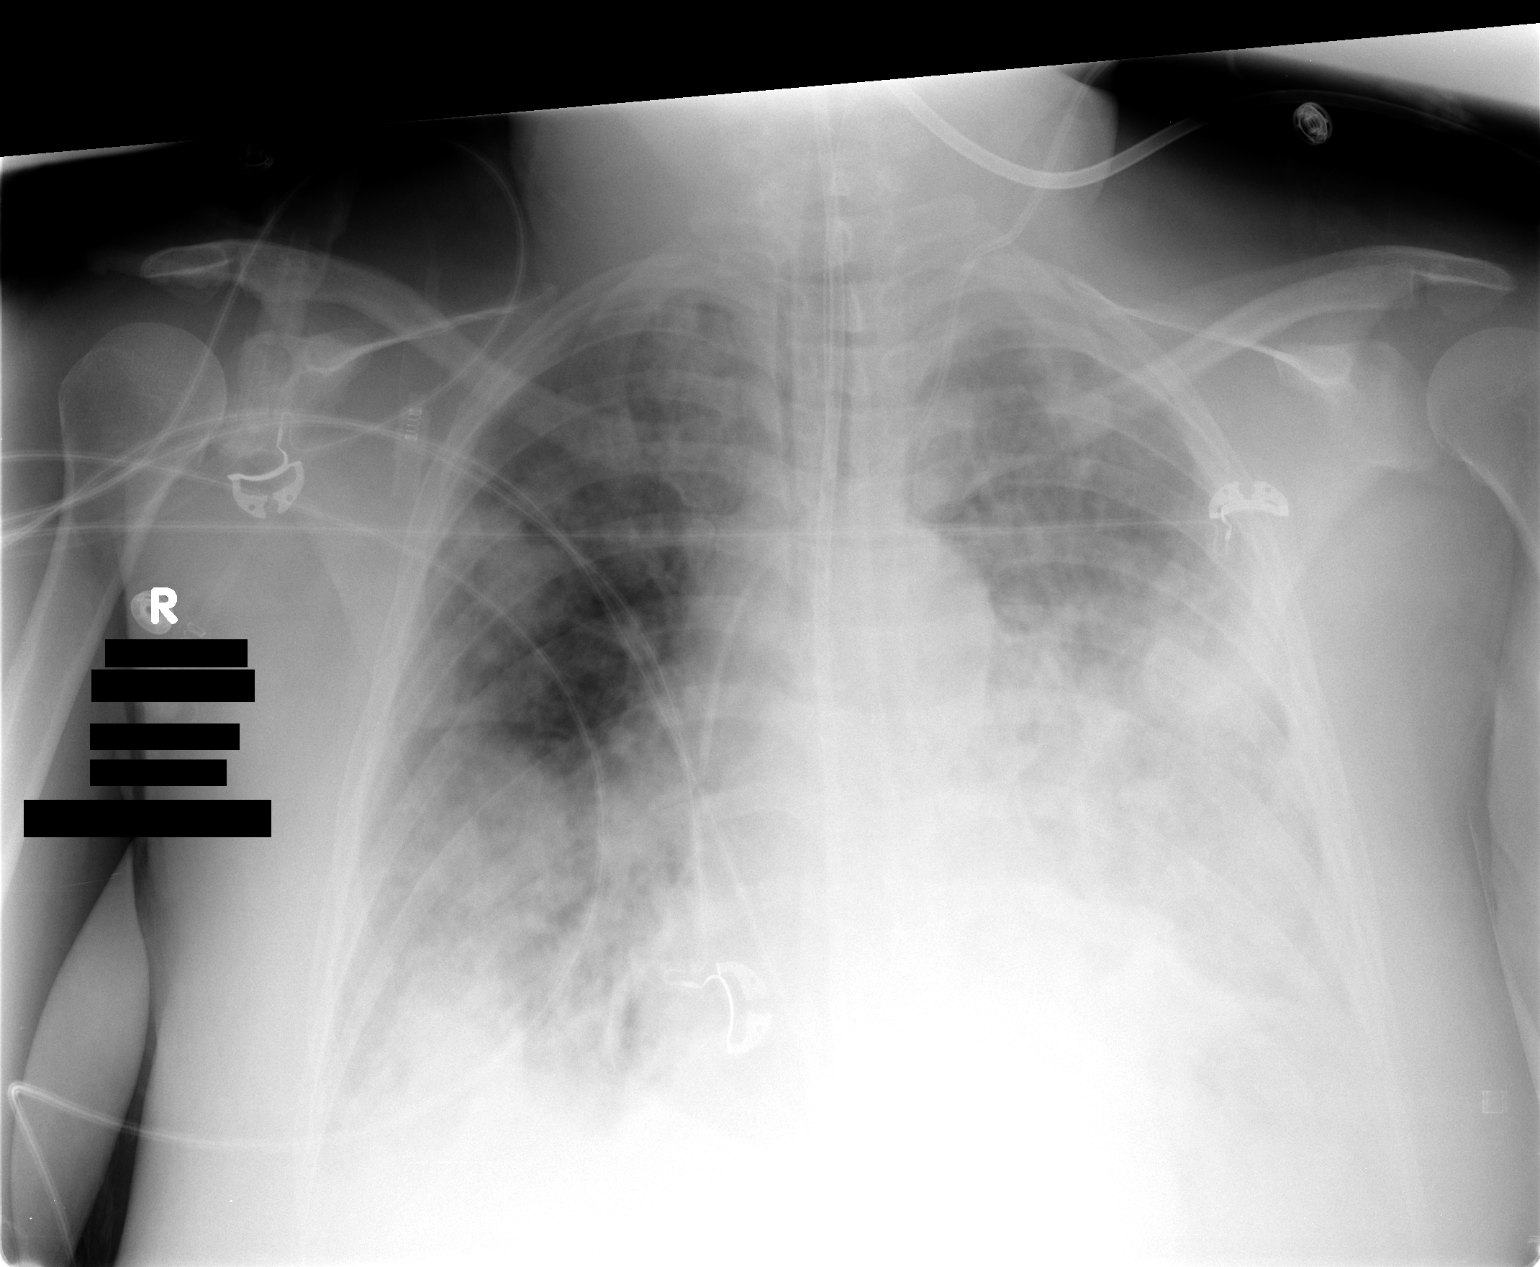

[1 of 1 positions shown; findings below may reference images not displayed]

FINDINGS: Endotracheal tube tip lies approximately 3 cm above the
carina.  No significant change in bilateral airspace opacities.
Aeration of the right upper lung appears improved.  Central line
positioning stable.
IMPRESSION: Improved aeration of right upper lung field.

## 2011-01-14 IMAGING — CT CT CHEST W/O CM
2 of 4 series · 15 of 36 positions shown, 18 images · non-contrast
Comparison: 11/27/2009

CLINICAL DATA: Pneumonia.  Hypokalemia.  Cough.  Shortness of
breath.  Fever.  Bloody sputum.

CT CHEST WITHOUT CONTRAST
TECHNIQUE: Multidetector CT imaging of the chest was performed
following the standard protocol without IV contrast.

[Series 2: chest w/o st · axial · non-contrast · 0.63mm/px · z∈[-268,-2]mm · 12 of 63 slices shown, 15 images]
[im 5/63  mediastinal]
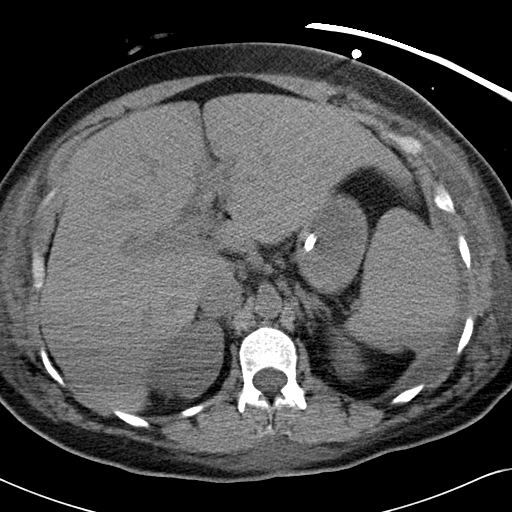
[im 5/63  lung]
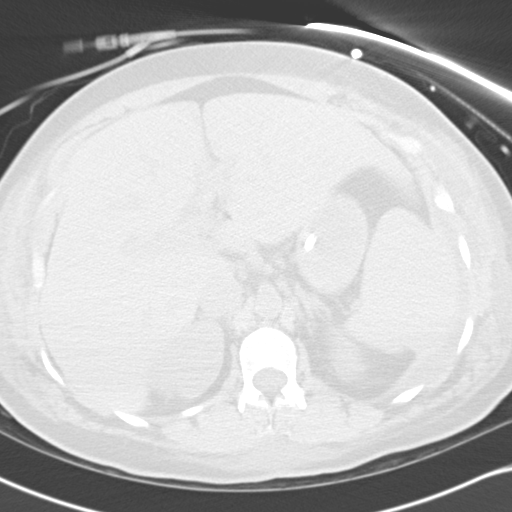
[im 10/63  lung]
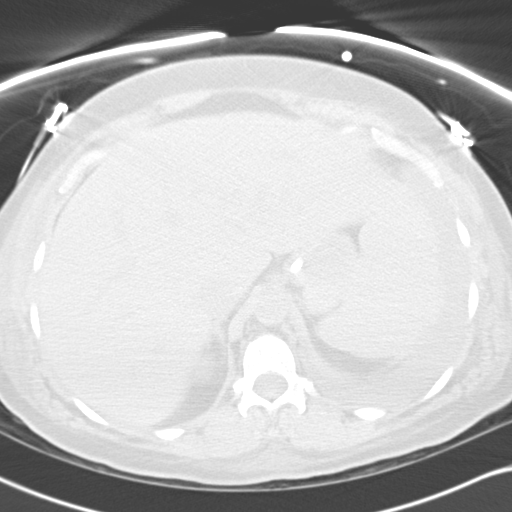
[im 15/63  lung]
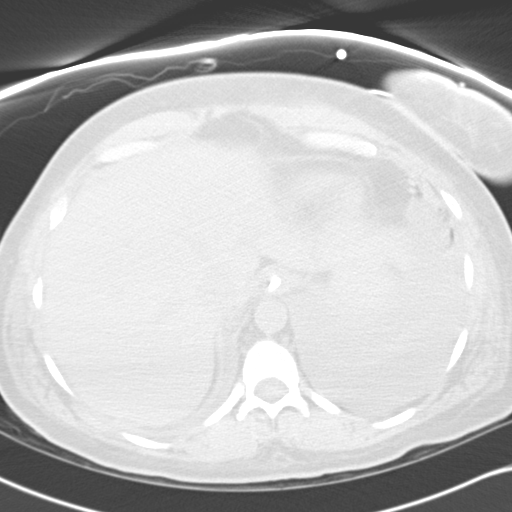
[im 20/63  lung]
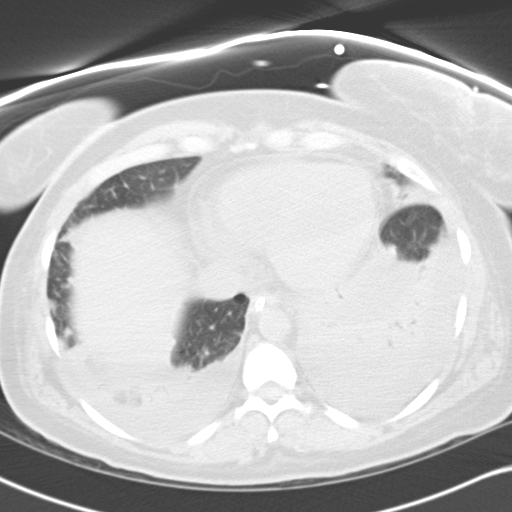
[im 24/63  mediastinal]
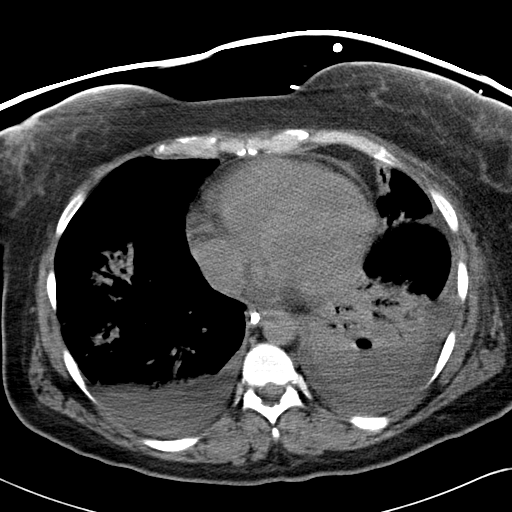
[im 24/63  lung]
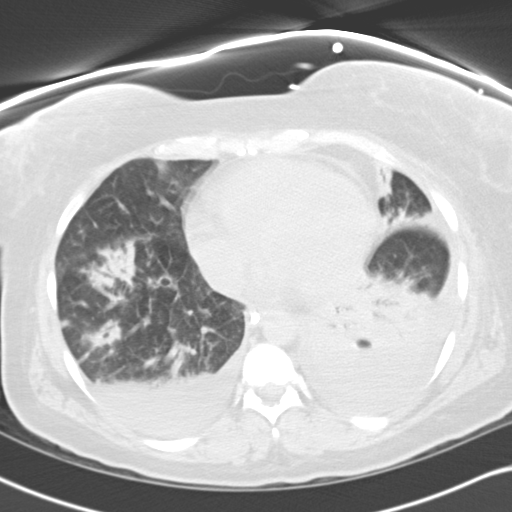
[im 29/63  lung]
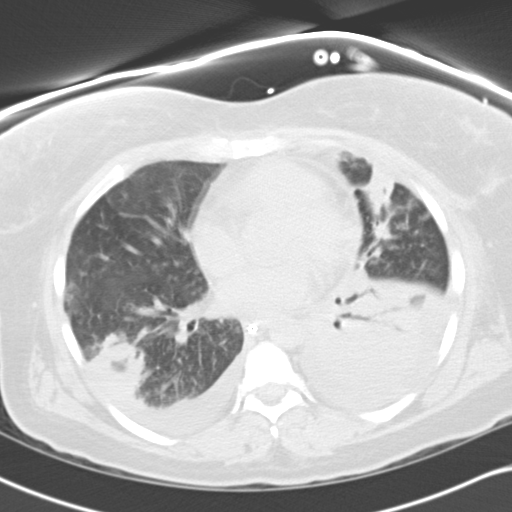
[im 34/63  lung]
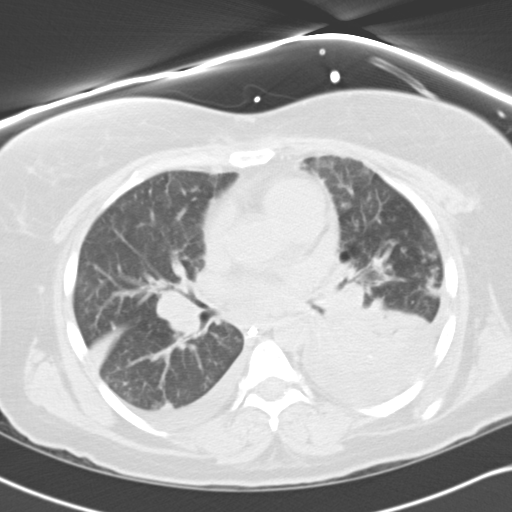
[im 39/63  lung]
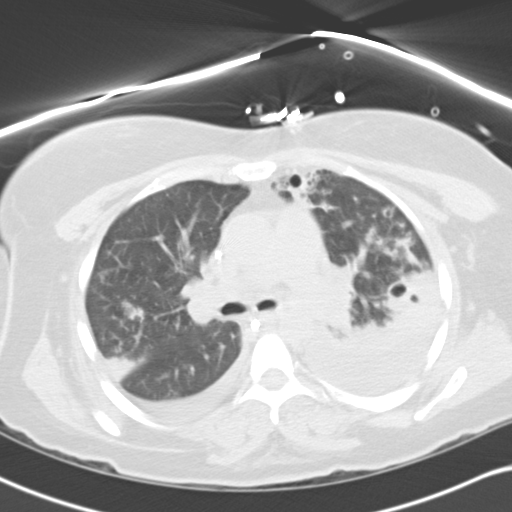
[im 43/63  mediastinal]
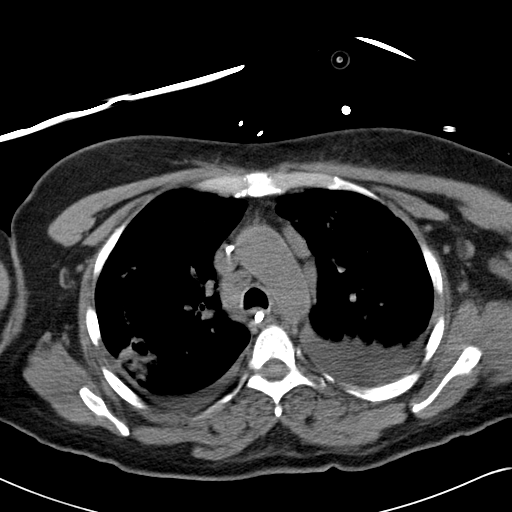
[im 43/63  lung]
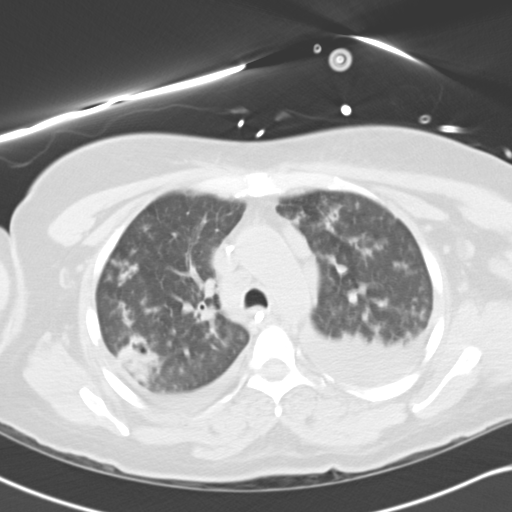
[im 48/63  lung]
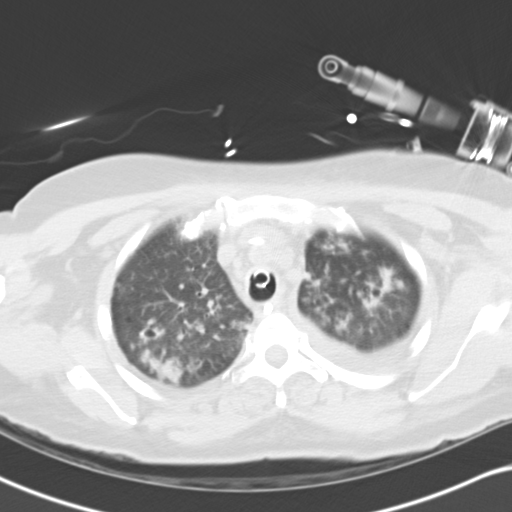
[im 53/63  lung]
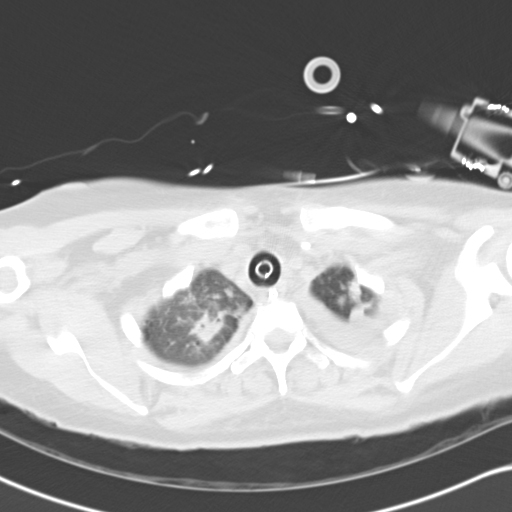
[im 58/63  lung]
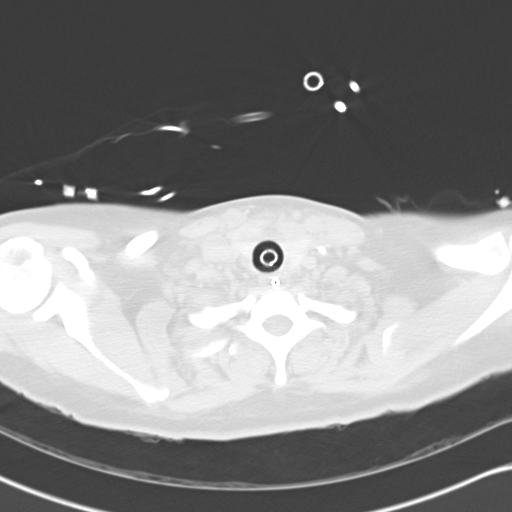

[Series 602: <mpr thick range> · coronal · 0.63mm/px · 3 of 80 slices shown]
[im 16/80  lung]
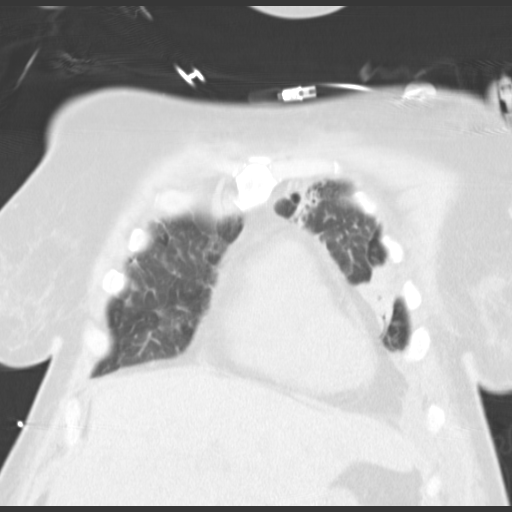
[im 32/80  lung]
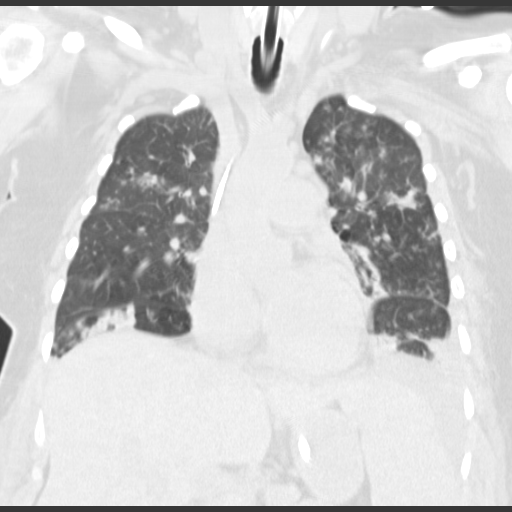
[im 48/80  lung]
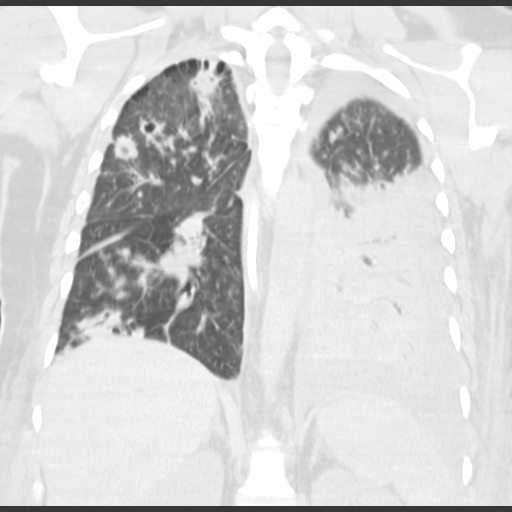

[15 of 36 positions shown; findings below may reference images not displayed]

FINDINGS: There is no axillary lymphadenopathy.  The mediastinal
lymphadenopathy is stable.  13 mm prevascular lymph node on the
previous study measures 13 mm today.  Left IJ central line tip is
positioned at the distal SVC level.  NG tube passes down the
esophagus into the stomach although the distal tip is not included
on the study.

Heart size is normal.  There is no pericardial effusion.  Small
bilateral pleural effusions are new in the interval.

Endotracheal tube tip is in the distal trachea.  The scattered
bilateral ill-defined pulmonary parenchymal nodules, many of which
show cavitation, persist.  Since the prior study, areas of patchy
ground-glass attenuation in both lungs have resolved and there has
been interval development of bilateral lower lobe collapse /
consolidation.

A dominant cavitary lesion in the right upper lobe measures 2.3 x
1.4 cm today compared 2.6 x 1.7 cm previously.  3.2 cm cavitary
lesion in the right lower lobe measures 3.5 cm today compared
cm previously.  The cavitation in this lesion appears decreased
although the wall has become thicker.  This particular lesion
appears to directly communicate with an airway.

Bone windows reveal no worrisome lytic or sclerotic osseous
lesions.
IMPRESSION: Interval development of small bilateral pleural effusions and
bilateral lower lobe collapse / consolidation.

The areas of alveolar ground-glass attenuation seen on the previous
study have resolved but the cavitary lesions, while showing
interval evolution, show no substantial progression or improvement
since the previous exam.

## 2011-01-15 IMAGING — CR DG CHEST 1V PORT
1 series · 1 of 1 positions shown · non-contrast
Comparison: 12/10/2009

CLINICAL DATA: PICC line placement, pneumonia, ventilatory support

PORTABLE CHEST - 1 VIEW

[view not recorded]
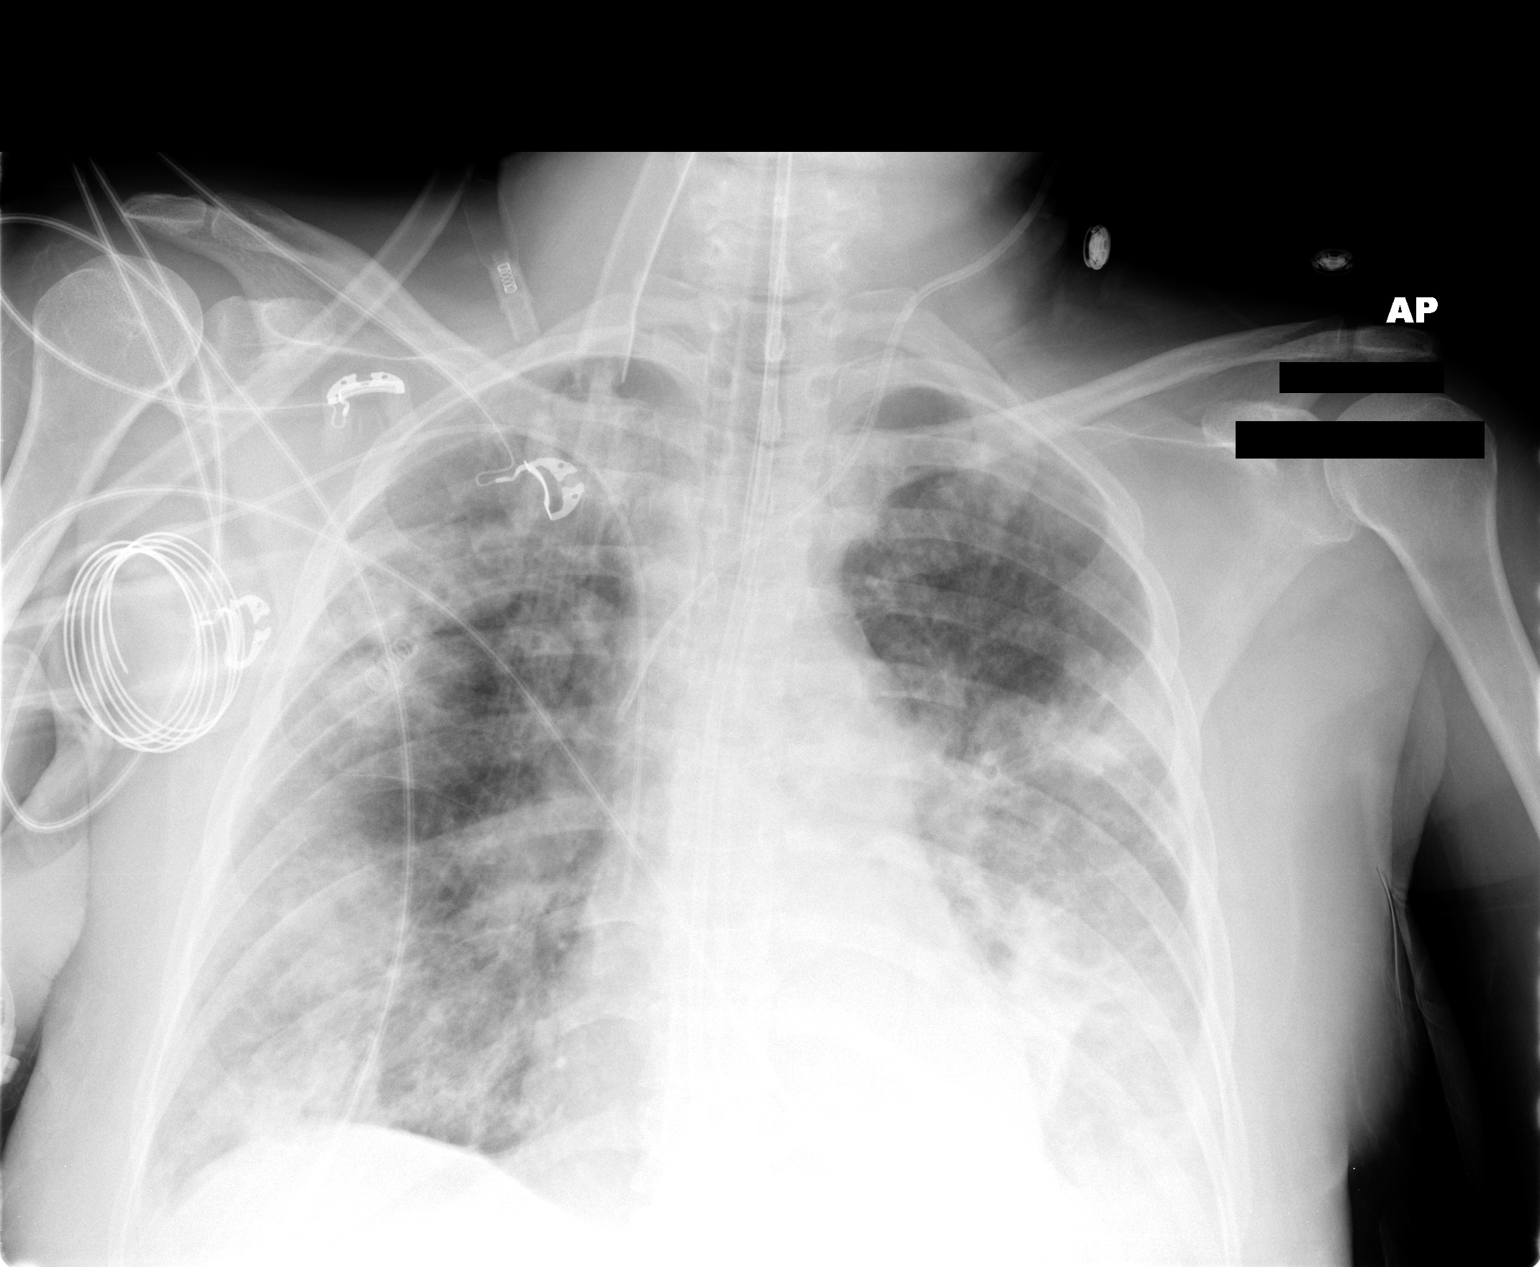

[1 of 1 positions shown; findings below may reference images not displayed]

FINDINGS: New right PICC line tip in the mid SVC.  Other support
apparatus stable.  No change in patchy bilateral airspace disease
with left lower lobe retrocardiac consolidation/collapse.  No
pneumothorax.  No enlarging effusion.
IMPRESSION: New right PICC line tip mid SVC.  Stable portable chest exam.

## 2011-01-15 IMAGING — CR DG CHEST 1V PORT
1 series · 1 of 1 positions shown · non-contrast
Comparison: 12/09/2008

CLINICAL DATA: Pneumonia/respiratory distress

PORTABLE CHEST - 1 VIEW

[view not recorded]
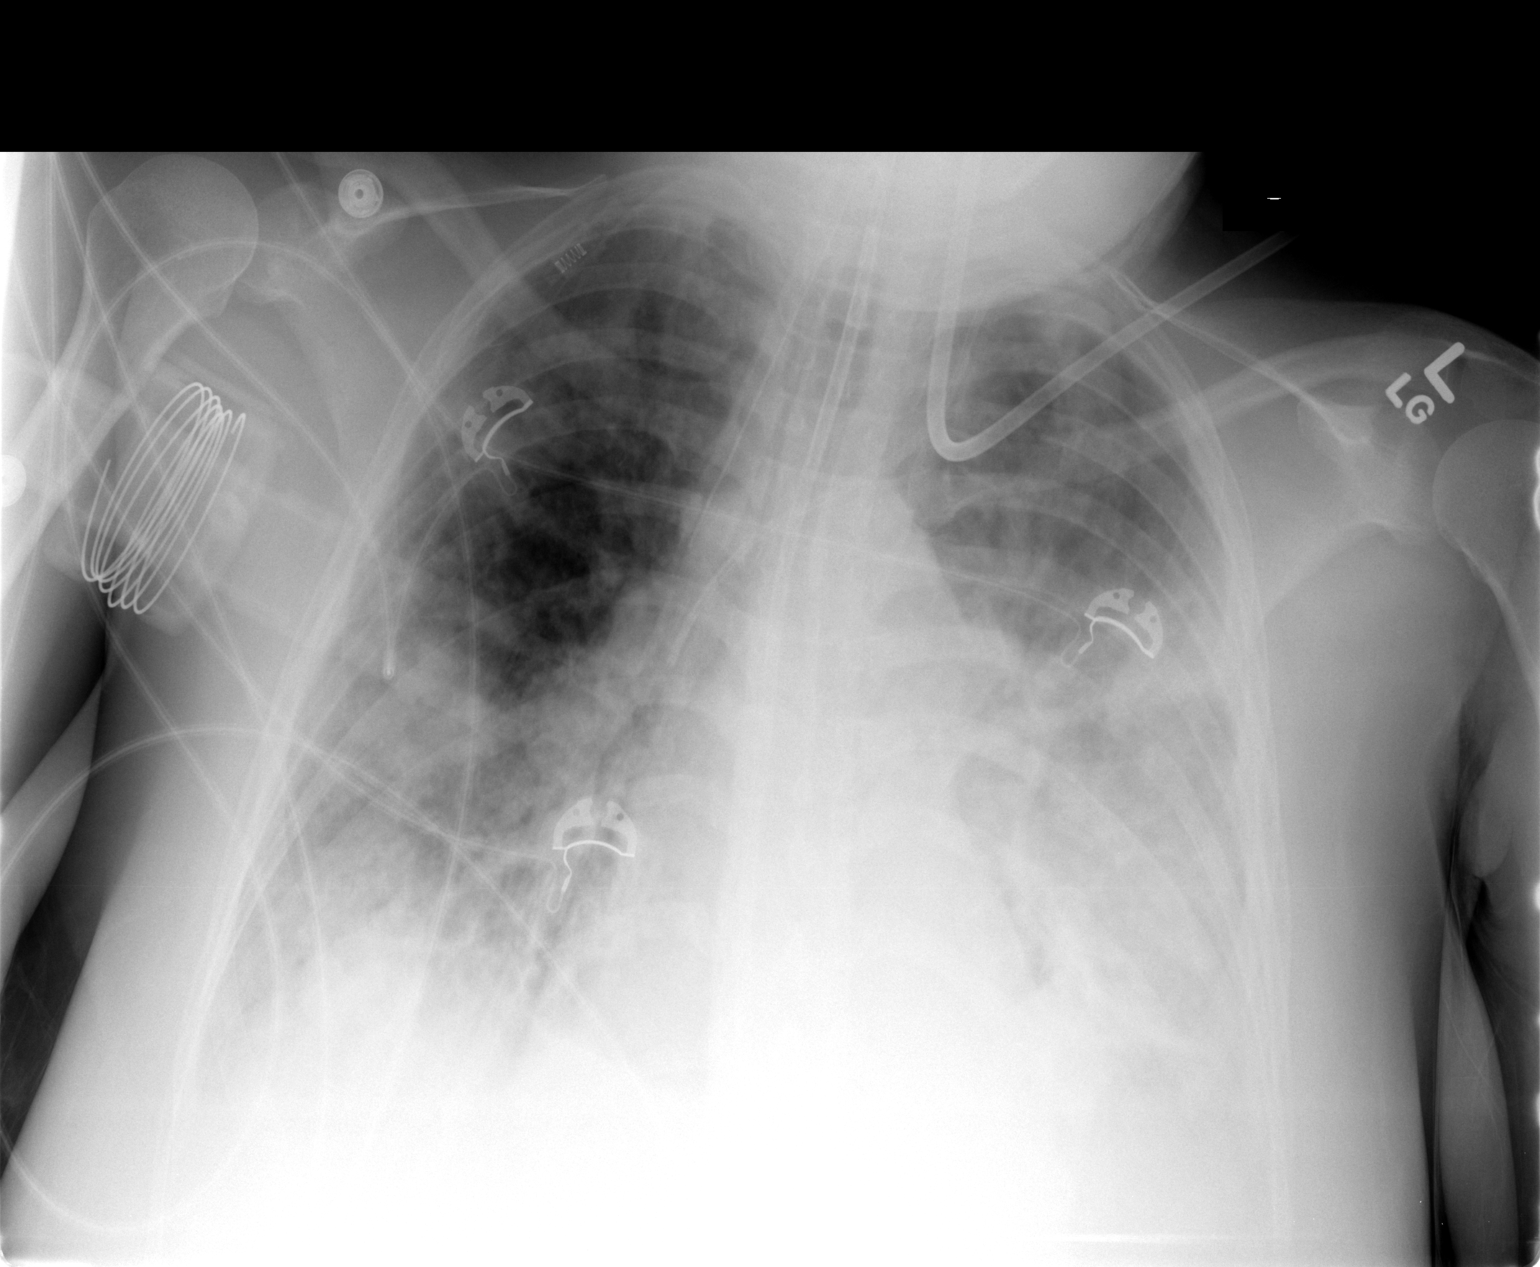

[1 of 1 positions shown; findings below may reference images not displayed]

FINDINGS: Bilateral airspace disease without significant change.
Support apparatus stable.  Heart size normal.
IMPRESSION: No significant change.

## 2011-01-16 IMAGING — CR DG CHEST 1V PORT
1 series · 1 of 1 positions shown · non-contrast
Comparison: [DATE]/5511 5955 hours

CLINICAL DATA: Pneumonia

PORTABLE CHEST - 1 VIEW

[view not recorded]
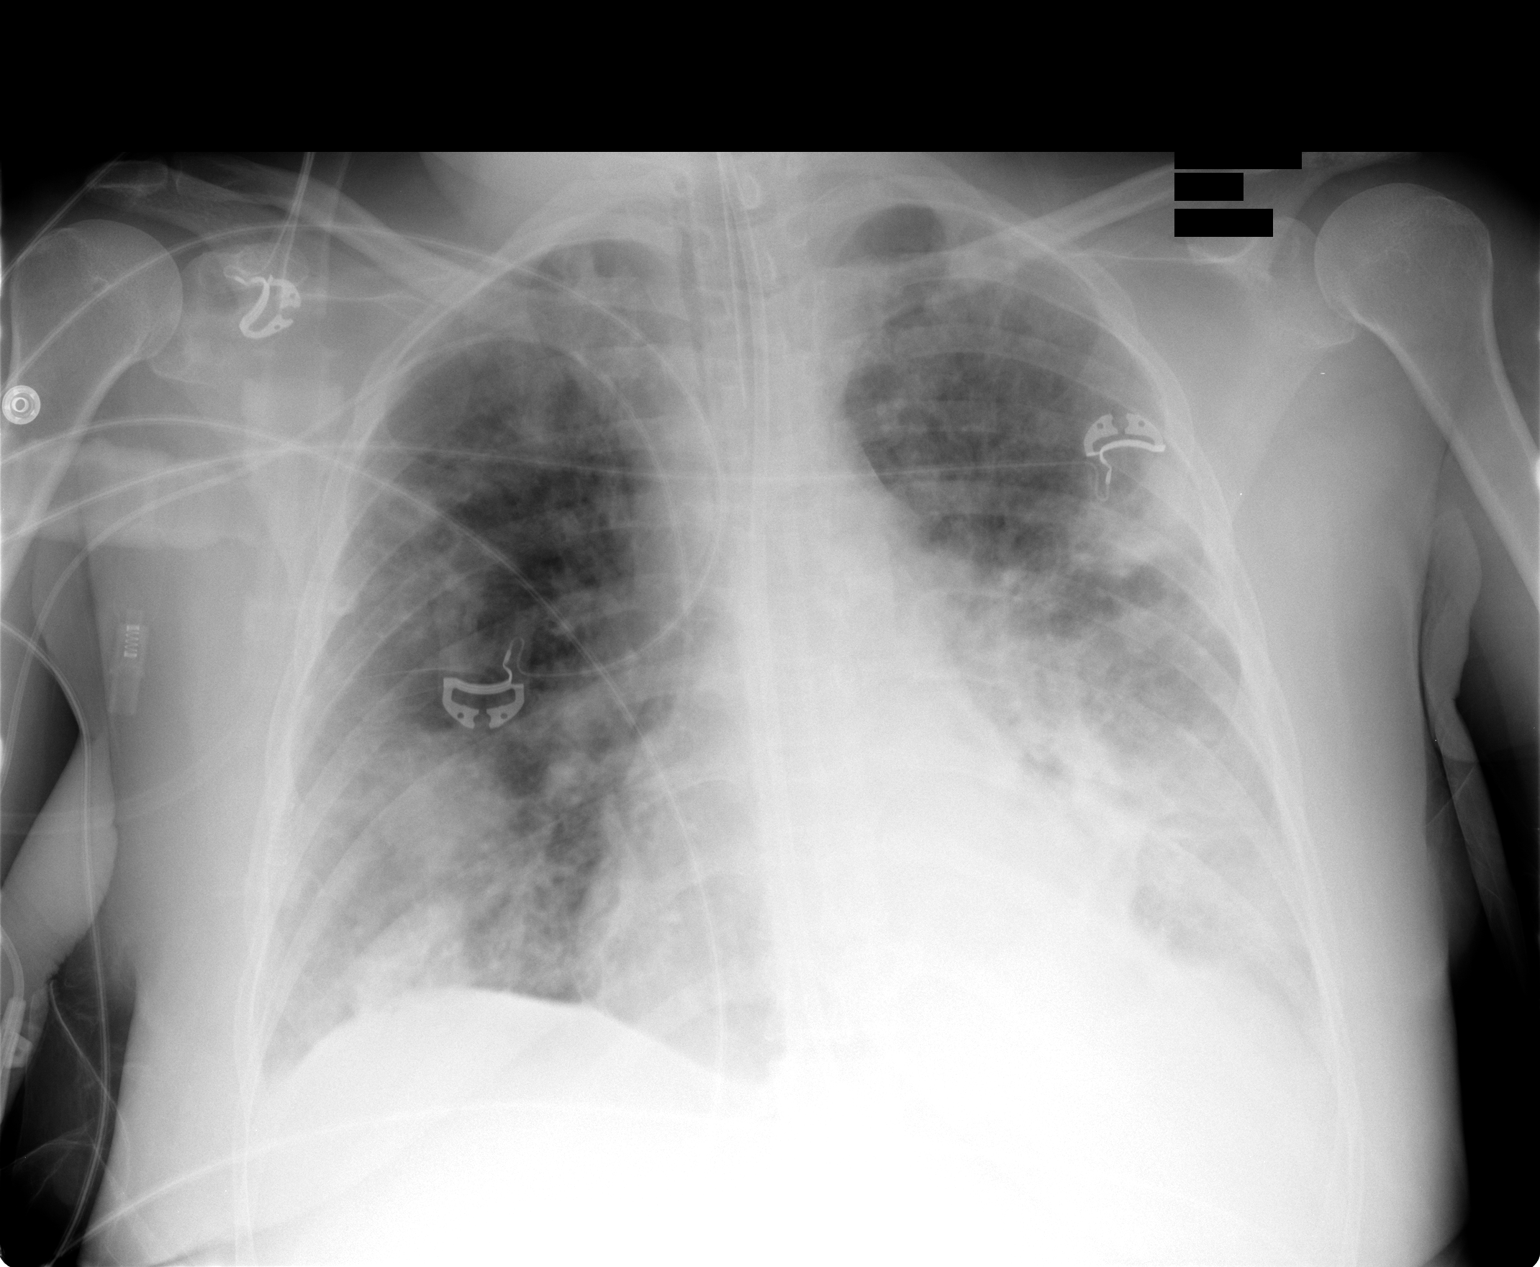

[1 of 1 positions shown; findings below may reference images not displayed]

FINDINGS: The left effusion has improved.  No pneumothorax.
Airspace disease and tubular structures are stable.  Stable cardiac
silhouette.
IMPRESSION: No pneumothorax post thoracentesis.

## 2011-01-16 IMAGING — US US PARACENTESIS
1 series · 6 of 6 positions shown · non-contrast
Comparison: none

CLINICAL DATA: Pneumonia

LEFT ULTRASOUND-GUIDED PORTABLE THORACENTESIS
TECHNIQUE: An ultrasound-guided thoracentesis was thoroughly
discussed with the patient and questions answered.  The benefits,
risks, alternatives and complications were also discussed.  The
patient understands and wishes to proceed with the procedure.  A
verbal as well as written consent was obtained. Ultrasound was
performed to localize and mark an adequate pocket of fluid for
thoracentesis.  The left chest wall was prepped and draped in the
normal sterile fashion.  1% Lidocaine was used for local
anesthesia.  Under ultrasound guidance a 19-gauge Yueh catheter was
introduced yielding approximately 180 ml of cloudy yellow fluid.
The patient tolerated the procedure well and there were no
immediate complications. Post procedure chest x-ray is pending.

[Series 1: us paracentesis · 0.26mm/px · 6 of 6 slices shown]
[im 1/6]
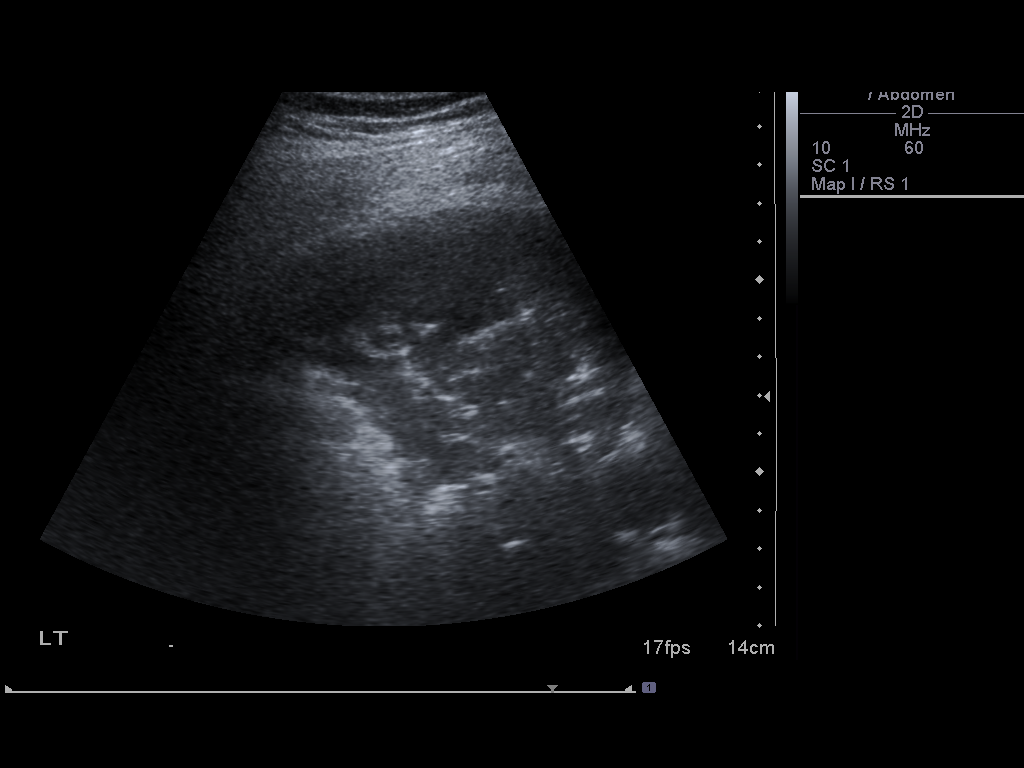
[im 2/6]
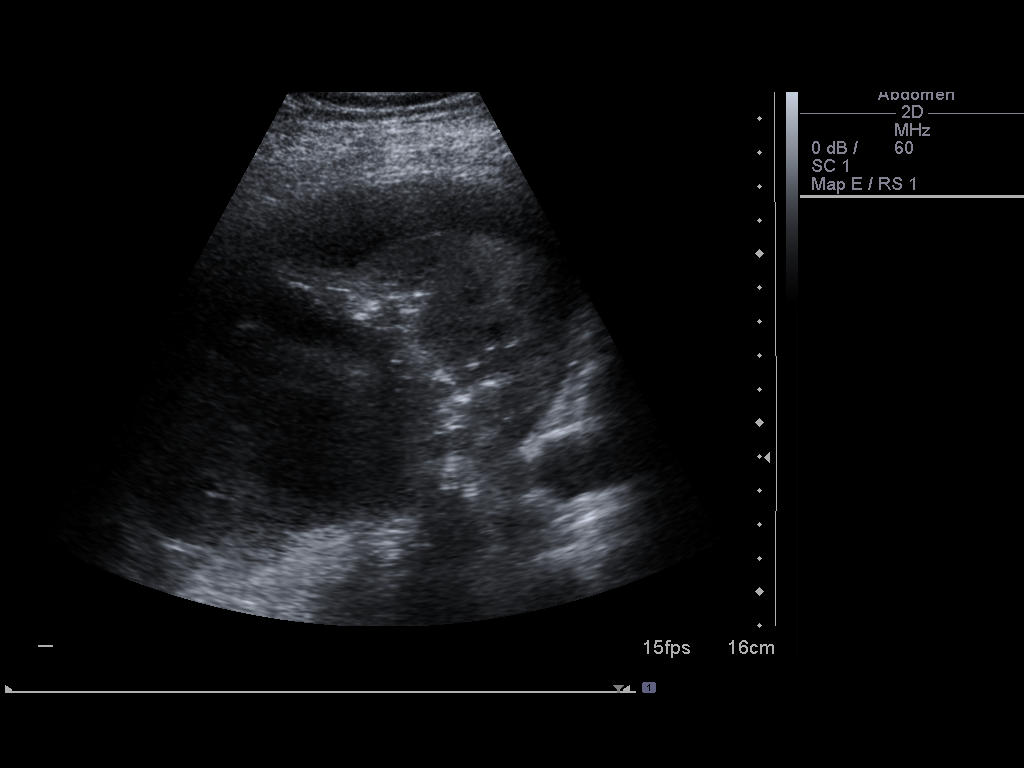
[im 3/6]
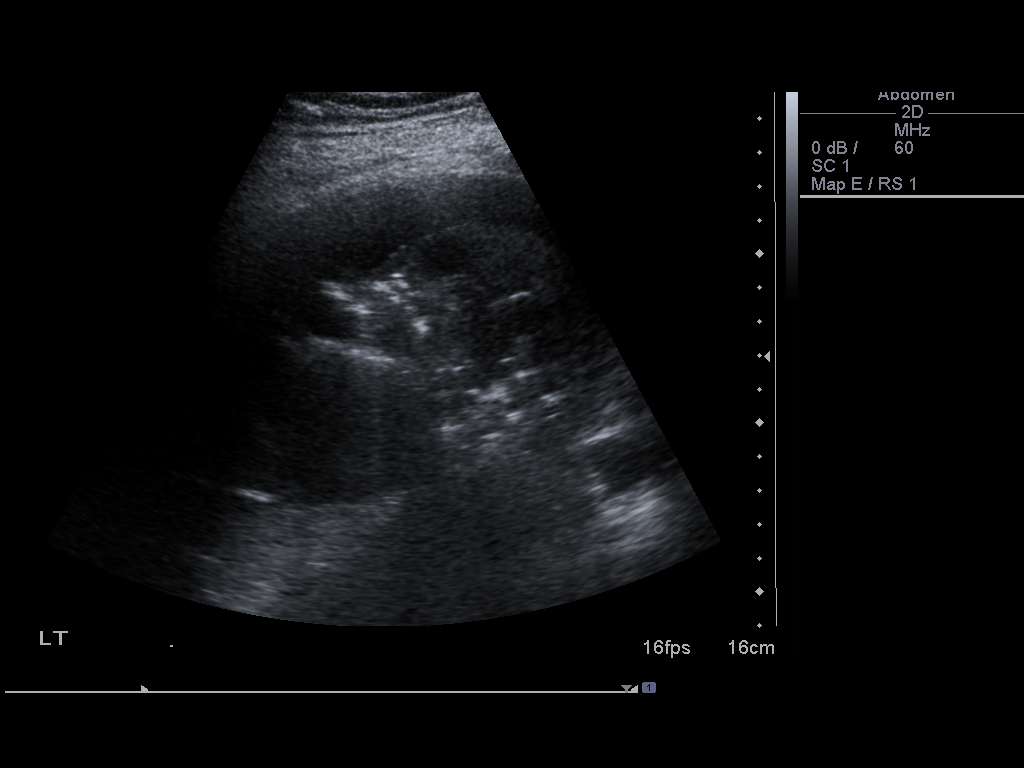
[im 4/6]
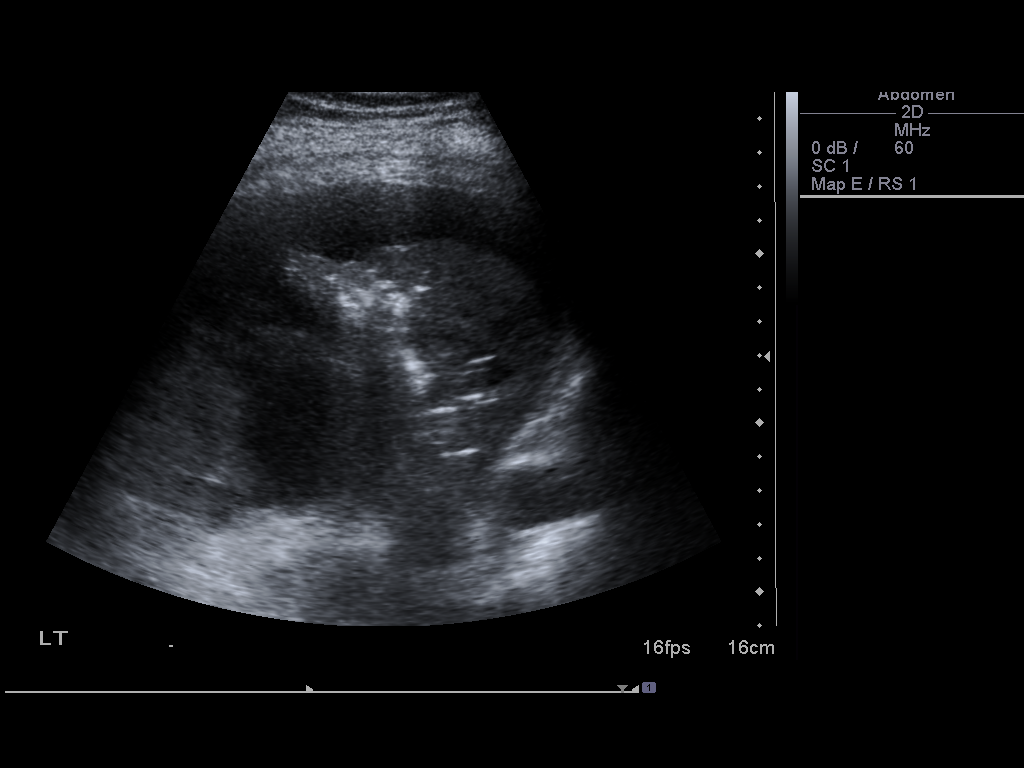
[im 5/6]
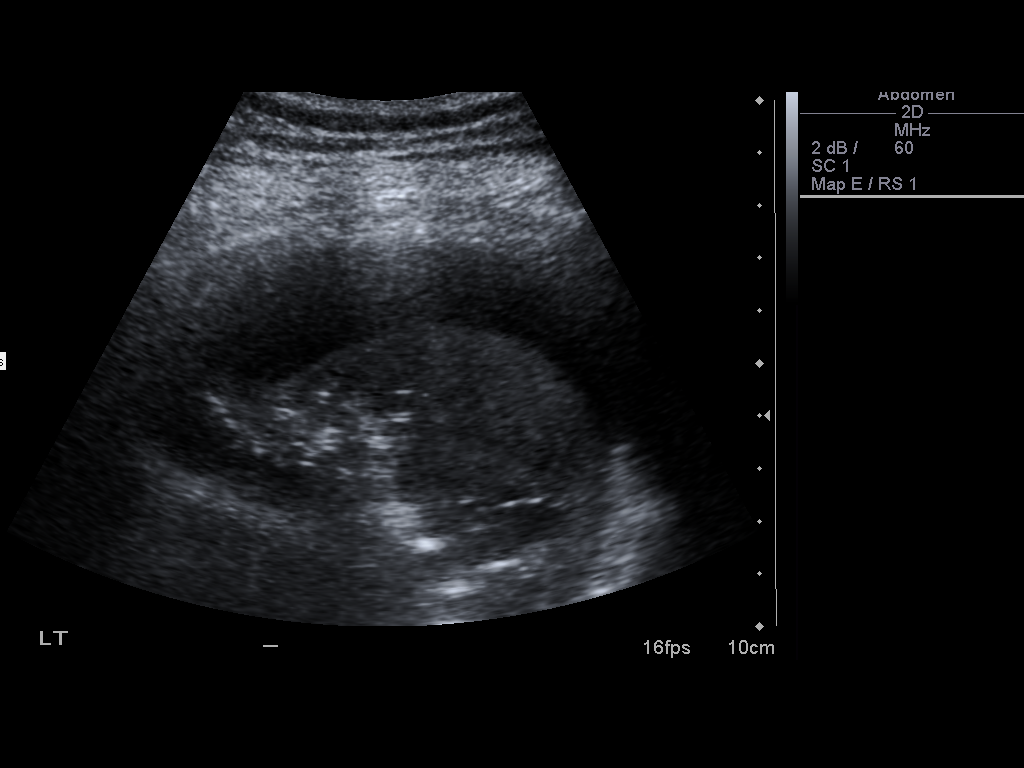
[im 6/6]
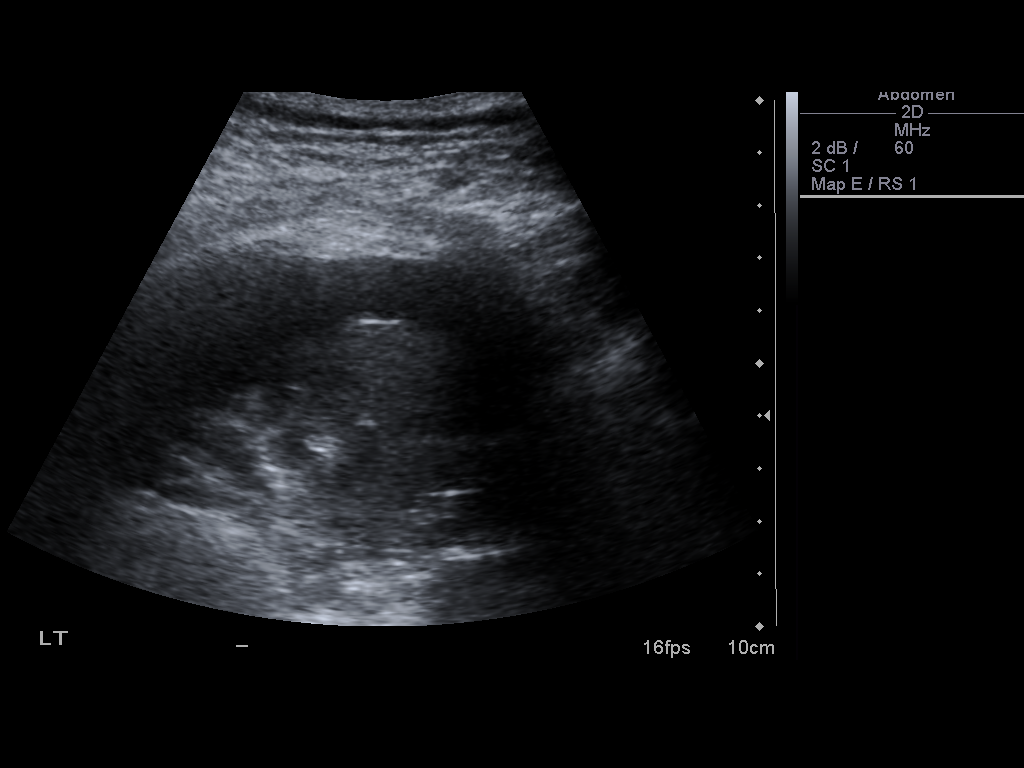

[6 of 6 positions shown; findings below may reference images not displayed]

IMPRESSION: Successful ultrasound-guided left thoracentesis yielding 180 ml of
fluid.

Read by: Jim, Lorraine.-[email or web address]

## 2011-01-17 IMAGING — CR DG CHEST 1V PORT
1 series · 1 of 1 positions shown · non-contrast
Comparison: 12/11/2009

CLINICAL DATA: Pneumonia

PORTABLE CHEST - 1 VIEW

[view not recorded]
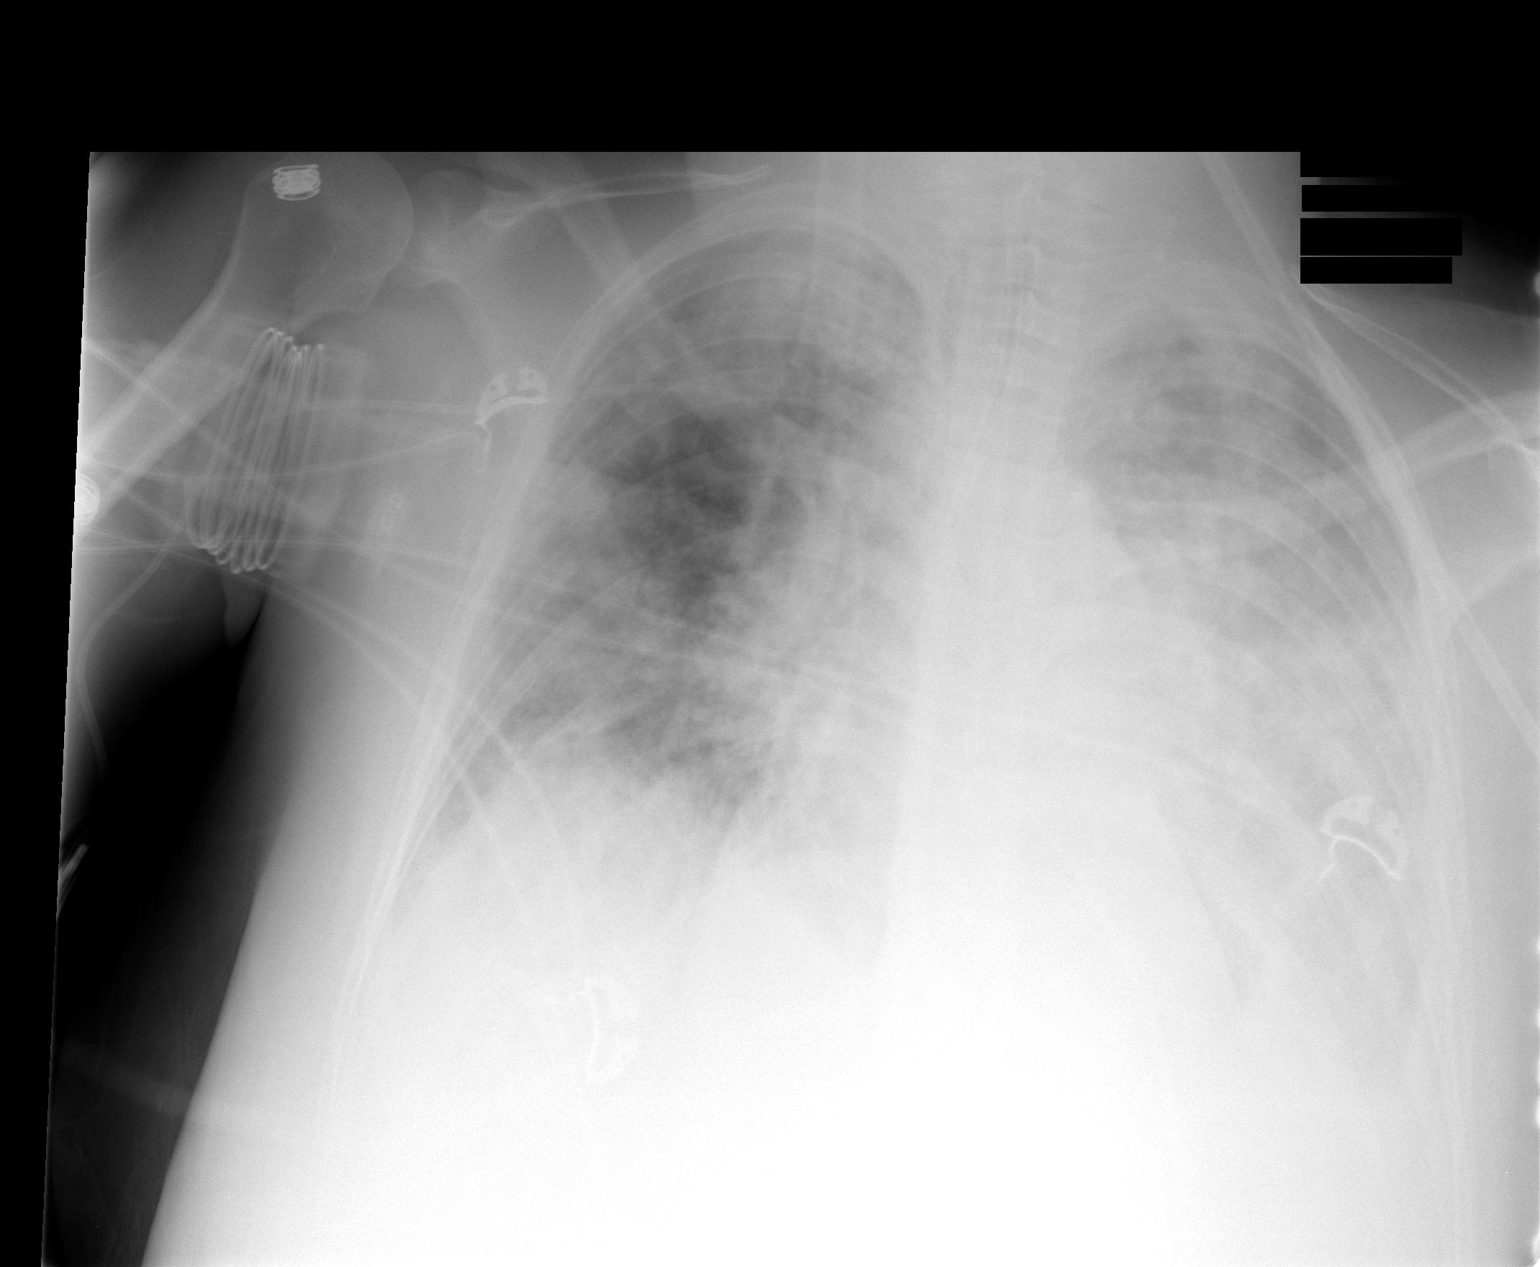

[1 of 1 positions shown; findings below may reference images not displayed]

FINDINGS: ETT is barely visible but appears been satisfactory
position.  Panda tube is noted in the esophagus.  Bilateral lower
lung zone, left greater right airspace opacities persist. Some
increased aeration at the right base. Left lower lobe
consolidation/atelectasis.  Airspace opacities at the apices
particularly on the right are now appreciated.
IMPRESSION: The radiograph is of a light technique but interpretable.  Increase
in airspace opacity at the apices mainly on the right.  No
significant change in airspace opacity in the left lower lung zone.
Some increased aeration at the right base.

## 2011-01-18 ENCOUNTER — Emergency Department (HOSPITAL_COMMUNITY)
Admission: EM | Admit: 2011-01-18 | Discharge: 2011-01-18 | Disposition: A | Discharge status: Home / Self Care | Payer: Medicaid Other | Attending: Emergency Medicine | Admitting: Emergency Medicine

## 2011-01-18 DIAGNOSIS — G43909 Migraine, unspecified, not intractable, without status migrainosus: Secondary | ICD-10-CM | POA: Insufficient documentation

## 2011-01-18 DIAGNOSIS — H53149 Visual discomfort, unspecified: Secondary | ICD-10-CM | POA: Insufficient documentation

## 2011-01-18 DIAGNOSIS — R110 Nausea: Secondary | ICD-10-CM | POA: Insufficient documentation

## 2011-01-19 ENCOUNTER — Other Ambulatory Visit: Payer: Self-pay | Admitting: Obstetrics & Gynecology

## 2011-01-19 ENCOUNTER — Encounter (HOSPITAL_COMMUNITY): Payer: Medicaid Other | Attending: Obstetrics & Gynecology

## 2011-01-19 DIAGNOSIS — Z01818 Encounter for other preprocedural examination: Secondary | ICD-10-CM | POA: Insufficient documentation

## 2011-01-19 LAB — BASIC METABOLIC PANEL
BUN: 7 mg/dL (ref 6–23)
Chloride: 93 mEq/L — ABNORMAL LOW (ref 96–112)
Creatinine, Ser: 0.52 mg/dL (ref 0.4–1.2)
GFR calc non Af Amer: >60 mL/min (ref >60)
Glucose, Bld: 196 mg/dL — ABNORMAL HIGH (ref 70–99)
Sodium: 140 mEq/L (ref 135–145)

## 2011-01-19 LAB — SURGICAL PCR SCREEN: Staphylococcus aureus: POSITIVE — AB

## 2011-01-19 LAB — CBC
Platelets: 356 10*3/uL (ref 150–400)
RBC: 4.79 MIL/uL (ref 3.87–5.11)
RDW: 17 % — ABNORMAL HIGH (ref 11.5–15.5)

## 2011-01-19 LAB — URINALYSIS, ROUTINE W REFLEX MICROSCOPIC
Hgb urine dipstick: NEGATIVE
Ketones, ur: NEGATIVE mg/dL
Protein, ur: NEGATIVE mg/dL
Specific Gravity, Urine: 1.02 (ref 1.005–1.030)
Urine Glucose, Fasting: NEGATIVE mg/dL
pH: 7 (ref 5.0–8.0)

## 2011-01-19 LAB — HCG, SERUM, QUALITATIVE: Preg, Serum: NEGATIVE

## 2011-01-19 IMAGING — CR DG CHEST 1V PORT
1 series · 1 of 1 positions shown · non-contrast
Comparison: 1 day prior

CLINICAL DATA: hypokalemia.  Pneumonia.

PORTABLE CHEST - 1 VIEW

[view not recorded]
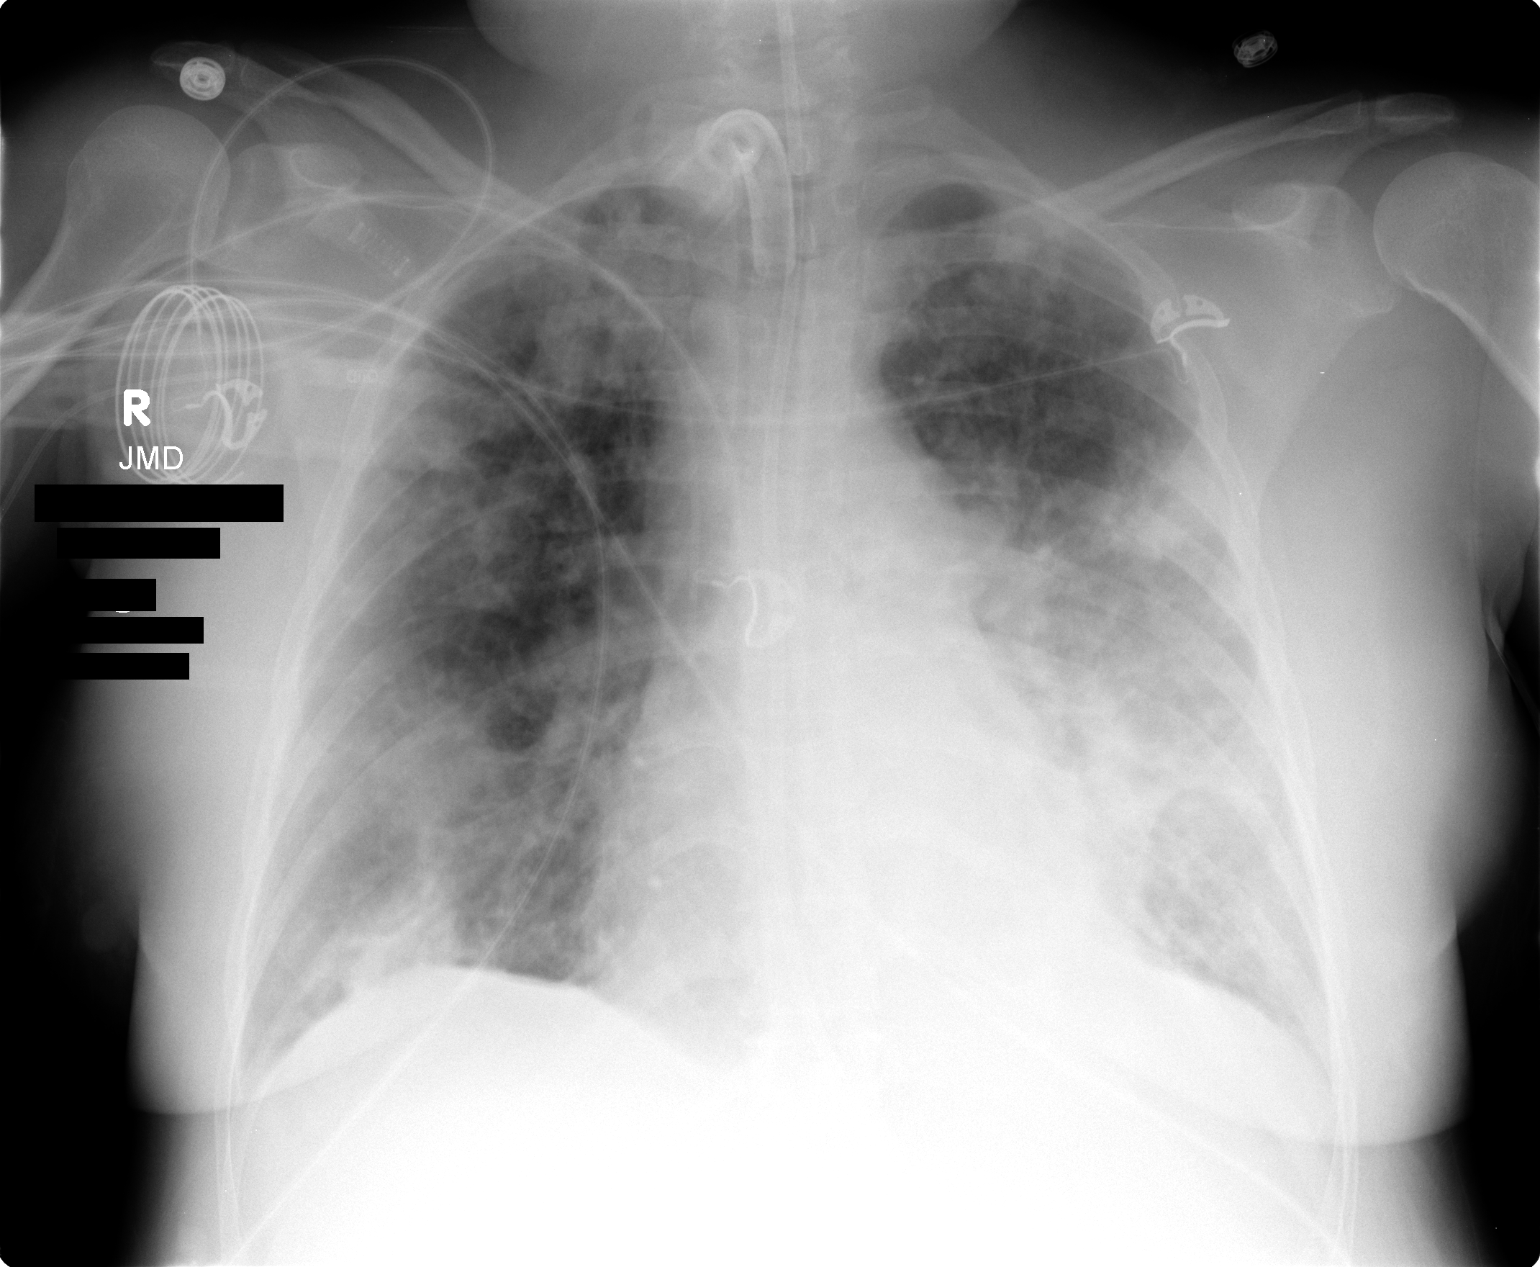

[1 of 1 positions shown; findings below may reference images not displayed]

FINDINGS: Tracheostomy unchanged.  A feeding tube extends beyond
the  inferior aspect of the film.  Right-sided central line is
unchanged.

Normal heart size.  No definite pleural effusion. No pneumothorax.
Slight improvement in the right base aeration.  Similar left sided
airspace disease.
IMPRESSION: Improved right base aeration.  Otherwise similar bilateral airspace
disease.  Likely infection.

## 2011-01-20 IMAGING — CR DG CHEST 1V PORT
1 series · 1 of 1 positions shown · non-contrast
Comparison: 1 day prior

CLINICAL DATA: Pneumonia.  Hypokalemia.

PORTABLE CHEST - 1 VIEW

[view not recorded]
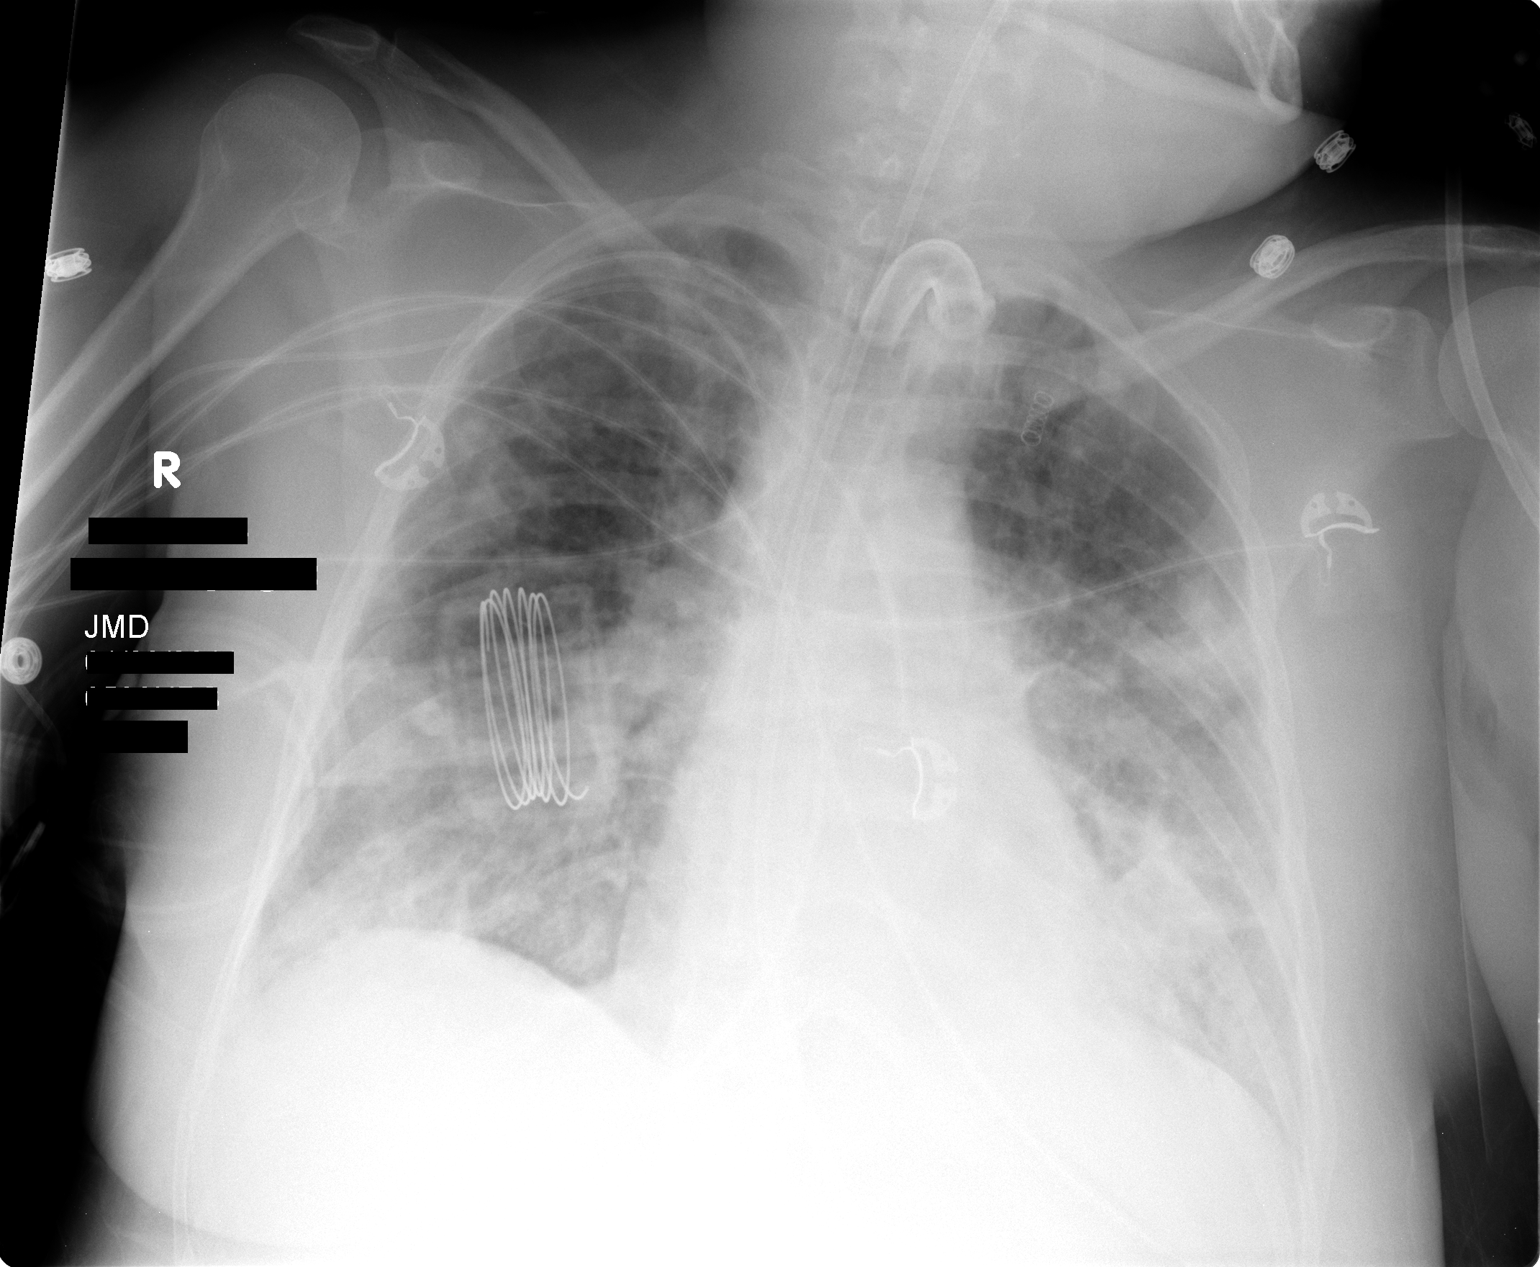

[1 of 1 positions shown; findings below may reference images not displayed]

FINDINGS: Feeding tube extends beyond the  inferior aspect of the
film.  Tracheostomy unchanged.  Numerous wires projecting over the
right hemithorax.  Right-sided PICC line poorly visualized
centrally.

Normal heart size.  No definite pleural effusion. No pneumothorax.
Given differences in technique, similar bilateral airspace disease.
IMPRESSION: No change in bilateral airspace disease, suspicious for infection.

## 2011-01-21 ENCOUNTER — Ambulatory Visit (HOSPITAL_COMMUNITY)
Admission: RE | Admit: 2011-01-21 | Discharge: 2011-01-21 | Disposition: A | Discharge status: Home / Self Care | Payer: Medicaid Other | Source: Ambulatory Visit | Attending: Obstetrics & Gynecology | Admitting: Obstetrics & Gynecology

## 2011-01-21 ENCOUNTER — Other Ambulatory Visit: Payer: Self-pay | Admitting: Obstetrics & Gynecology

## 2011-01-21 DIAGNOSIS — N920 Excessive and frequent menstruation with regular cycle: Secondary | ICD-10-CM | POA: Insufficient documentation

## 2011-01-21 DIAGNOSIS — N946 Dysmenorrhea, unspecified: Secondary | ICD-10-CM | POA: Insufficient documentation

## 2011-01-21 DIAGNOSIS — E119 Type 2 diabetes mellitus without complications: Secondary | ICD-10-CM | POA: Insufficient documentation

## 2011-01-21 LAB — GLUCOSE, CAPILLARY
Glucose-Capillary: 81 mg/dL (ref 70–99)
Glucose-Capillary: 115 mg/dL — ABNORMAL HIGH (ref 70–99)

## 2011-01-21 IMAGING — CR DG CHEST 1V PORT
1 series · 1 of 1 positions shown · non-contrast
Comparison: Chest 12/15/2009.

CLINICAL DATA: Pneumonia.

PORTABLE CHEST - 1 VIEW

[view not recorded]
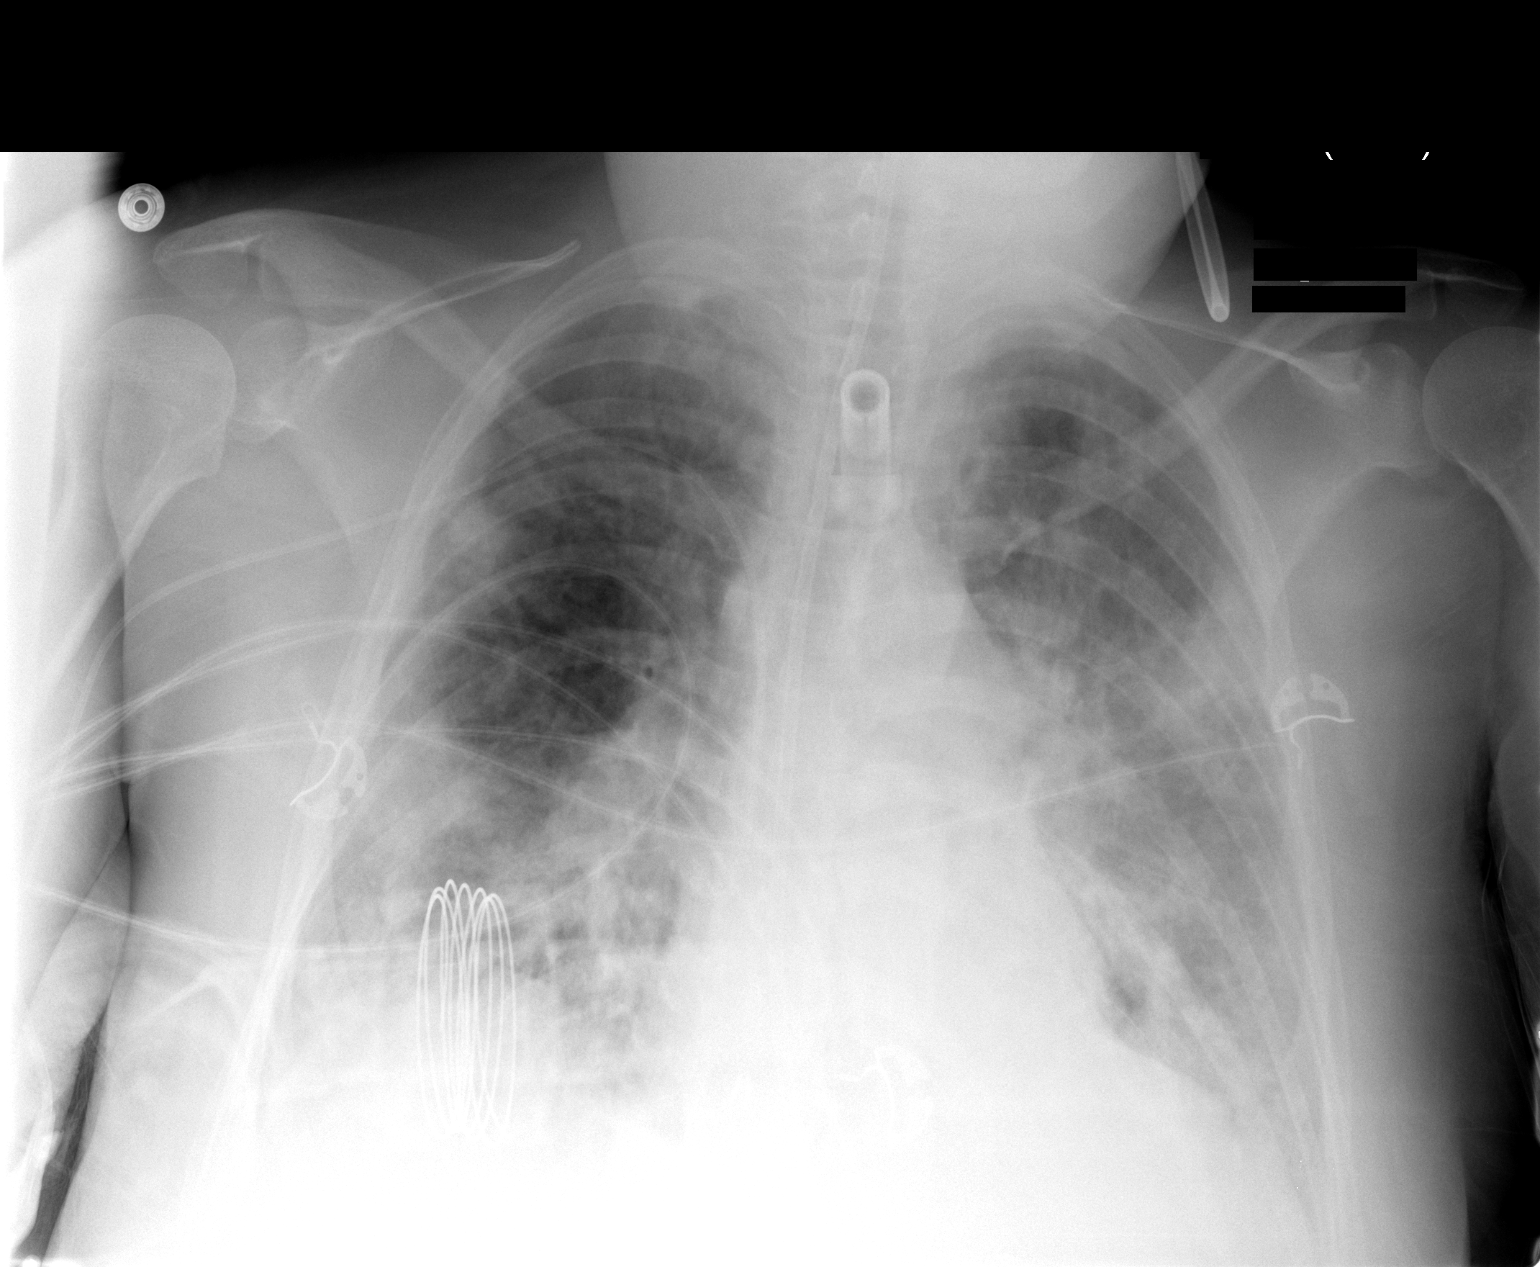

[1 of 1 positions shown; findings below may reference images not displayed]

FINDINGS: Patchy bilateral airspace disease persists without
notable change.  Heart size is normal.  Support tubes and lines are
unchanged.
IMPRESSION: No interval change.

## 2011-01-22 IMAGING — CR DG CHEST 1V PORT
1 series · 1 of 1 positions shown · non-contrast
Comparison: Chest 12/16/2009 and 12/15/2009.

CLINICAL DATA: Pneumonia.  Ventilated patient.

PORTABLE CHEST - 1 VIEW

[view not recorded]
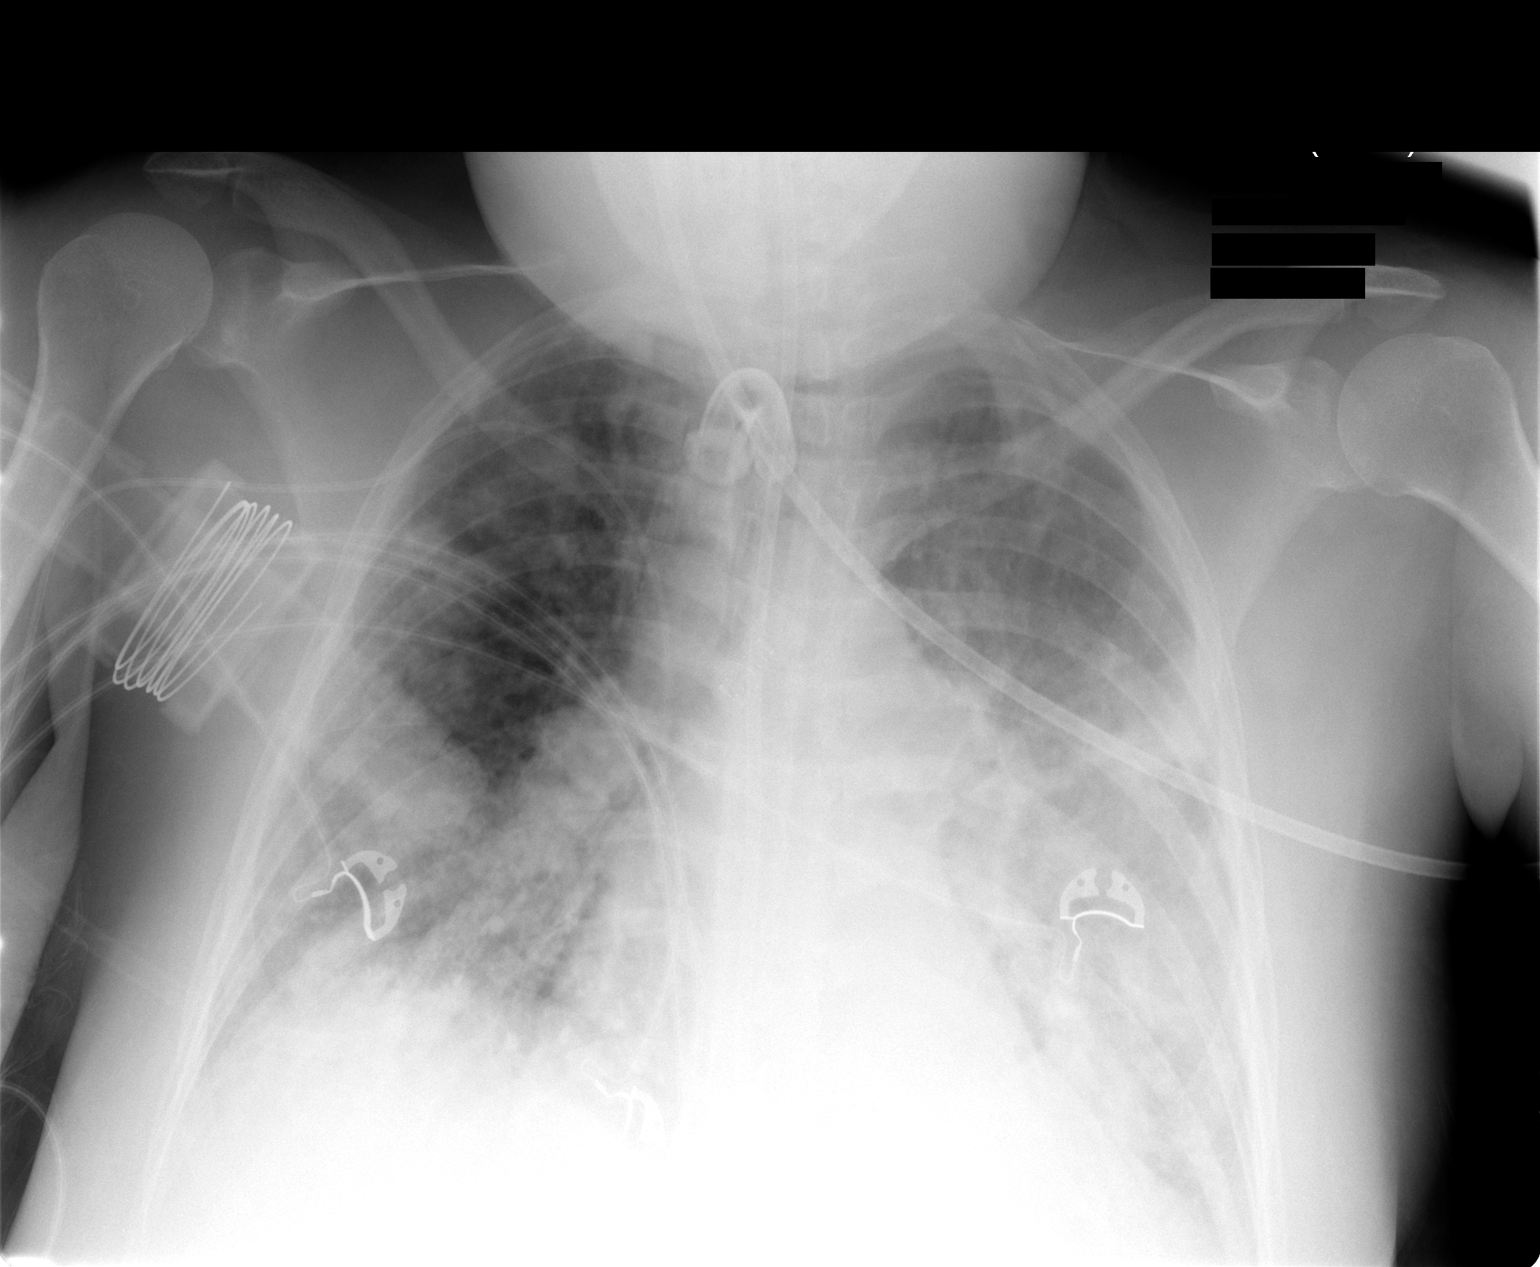

[1 of 1 positions shown; findings below may reference images not displayed]

FINDINGS: Support tubes and lines are unchanged.  Left worse than
right airspace disease persists with some worsening in aeration on
the left.  Heart size is normal.
IMPRESSION: Bilateral airspace disease shows some worsening on the left.

## 2011-01-23 IMAGING — CR DG CHEST 1V PORT
1 series · 1 of 1 positions shown · non-contrast
Comparison: Chest 12/17/2009.

CLINICAL DATA: Respiratory distress.

PORTABLE CHEST - 1 VIEW

[view not recorded]
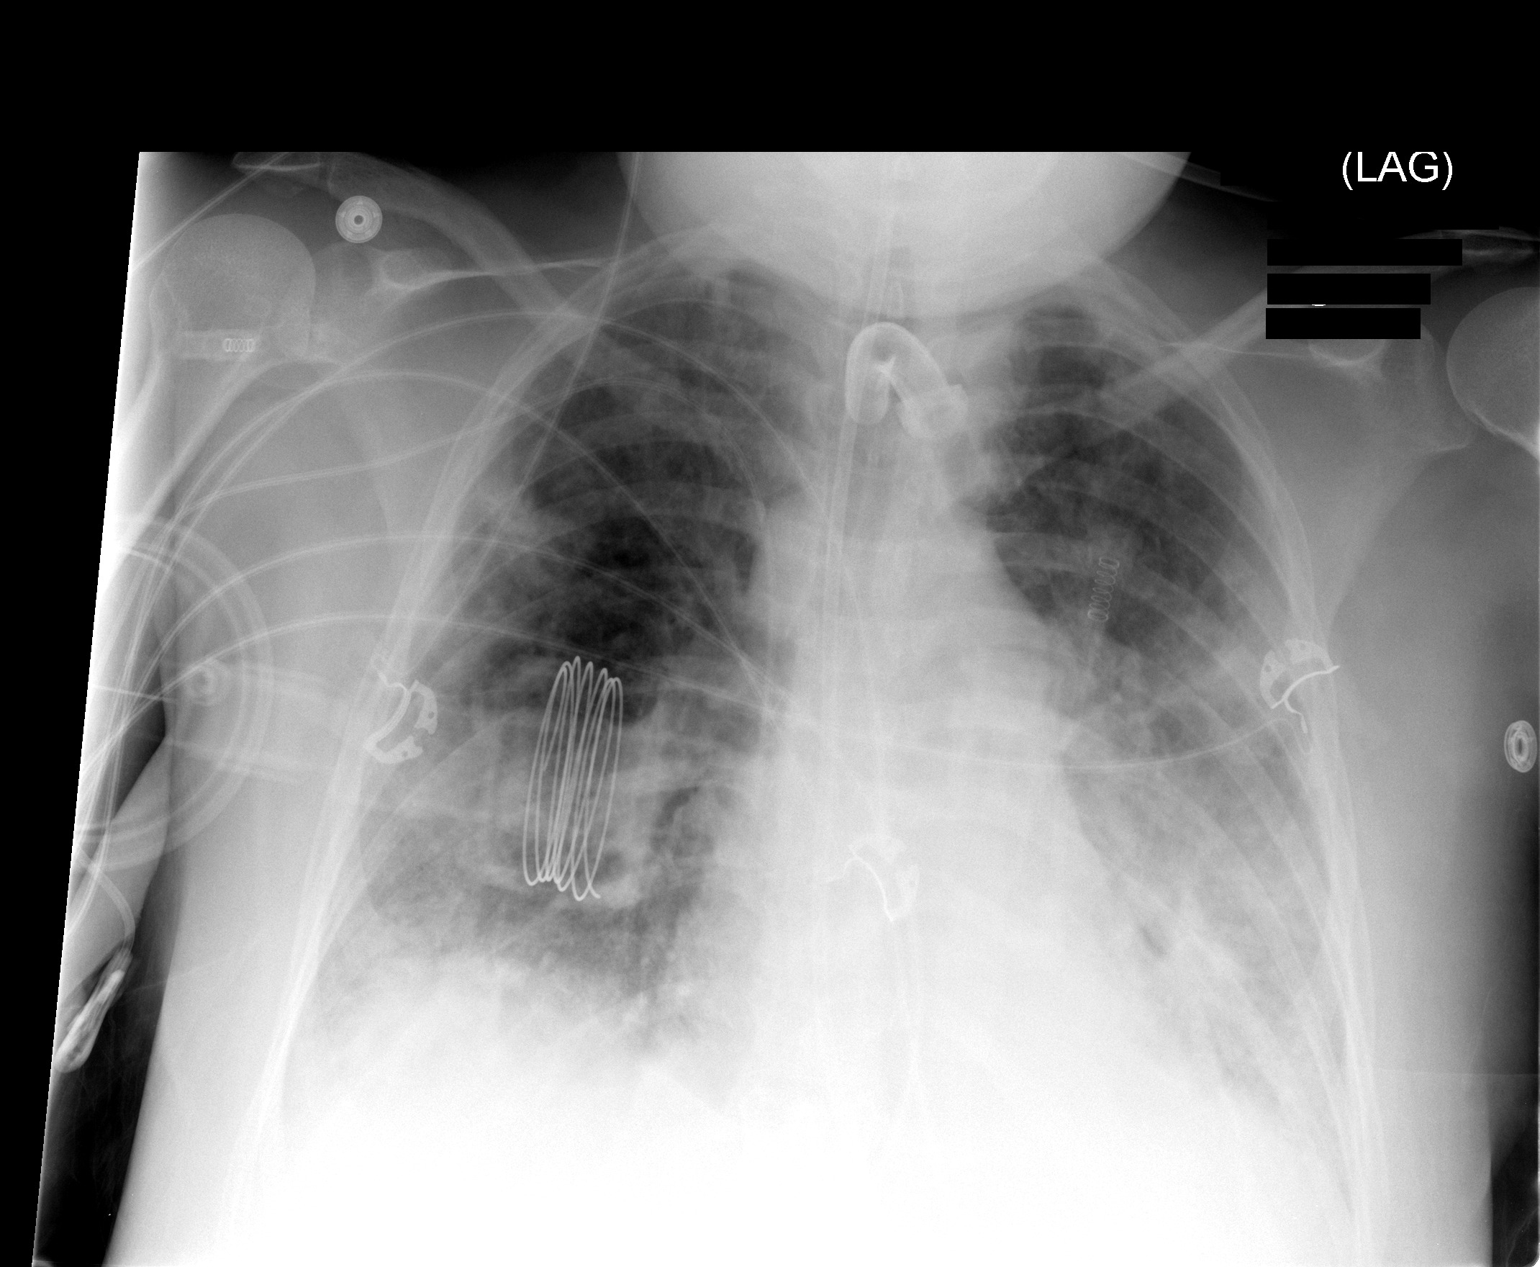

[1 of 1 positions shown; findings below may reference images not displayed]

FINDINGS: Support tubes and lines are unchanged.  Left worse than
right airspace disease persists without marked change.  Heart size
normal.
IMPRESSION: No interval change.

## 2011-01-25 IMAGING — CR DG CHEST 1V PORT
1 series · 1 of 1 positions shown · non-contrast
Comparison: 12/18/2009.

CLINICAL DATA: Ventilator pneumonia.

PORTABLE CHEST - 1 VIEW

[view not recorded]
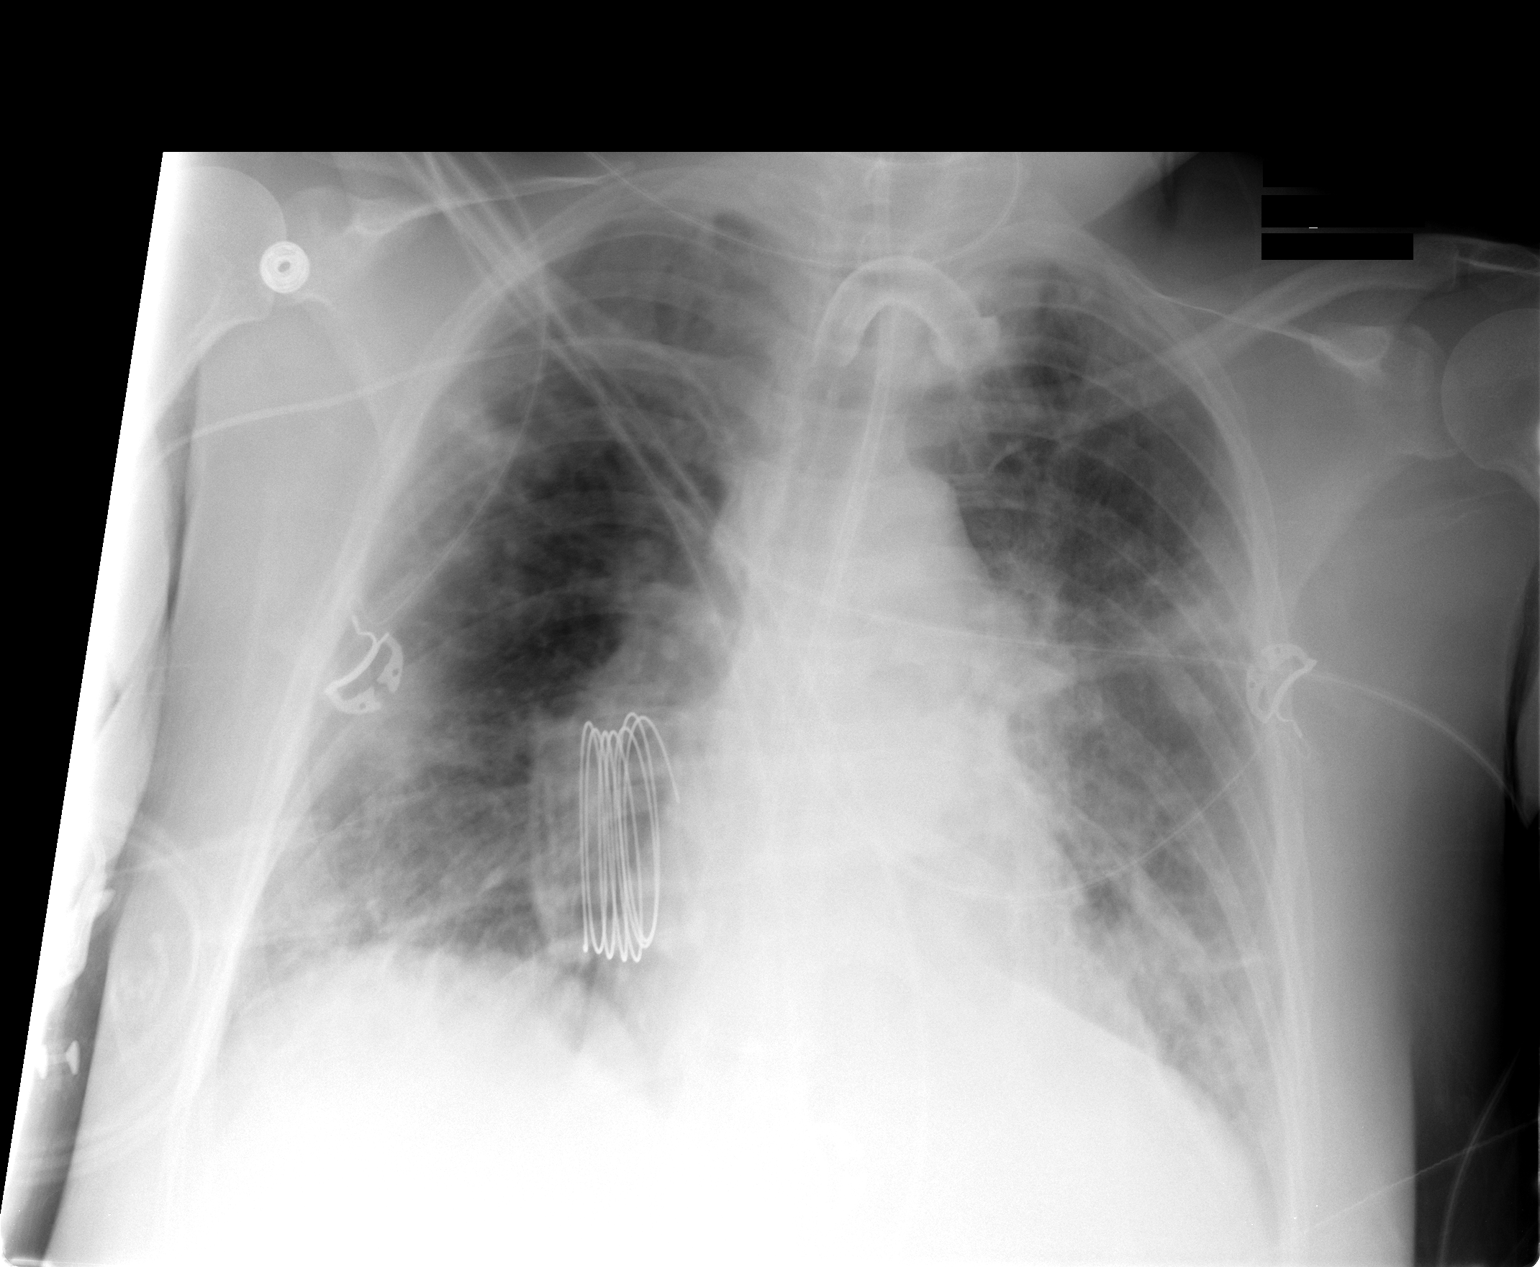

[1 of 1 positions shown; findings below may reference images not displayed]

FINDINGS: Tracheostomy remains in good position.  Improvement in
bilateral airspace disease since the prior study.  There is no
pneumothorax or pleural effusion.
IMPRESSION: Improvement in diffuse bilateral airspace disease which may be due
to pneumonia or edema.

## 2011-01-25 IMAGING — CR DG ABDOMEN 1V
1 series · 1 of 1 positions shown · non-contrast
Comparison: 12/20/2009.

CLINICAL DATA: Panda placement.

ABDOMEN - 1 VIEW

[view not recorded]
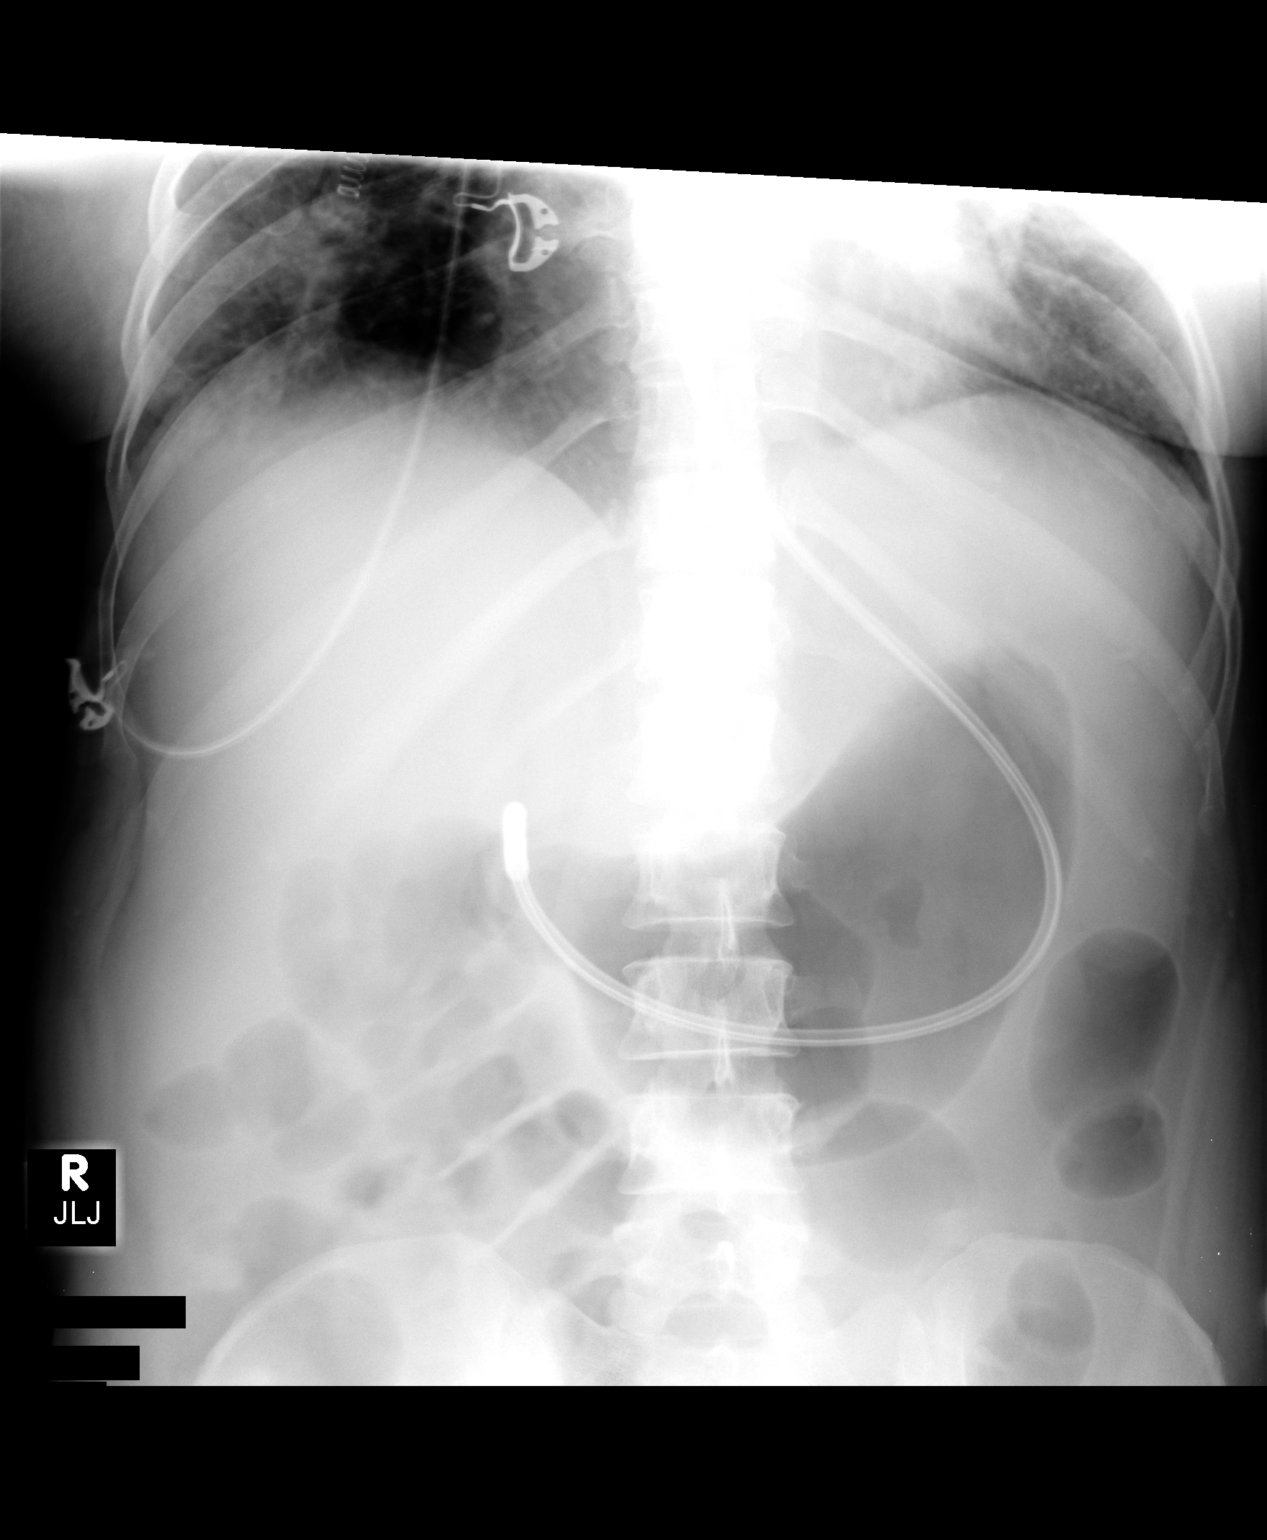

[1 of 1 positions shown; findings below may reference images not displayed]

FINDINGS: Feeding tube has been advanced with the tip now in the
region of the pylorus.  The stomach is distended with air.  There
is no bowel obstruction.
IMPRESSION: Feeding tube has been advanced to the pylorus.

## 2011-01-25 IMAGING — CR DG ABD PORTABLE 1V
1 series · 1 of 1 positions shown · non-contrast
Comparison: 11/29/2009

CLINICAL DATA: Feeding tube placement

ABDOMEN - 1 VIEW

[view not recorded]
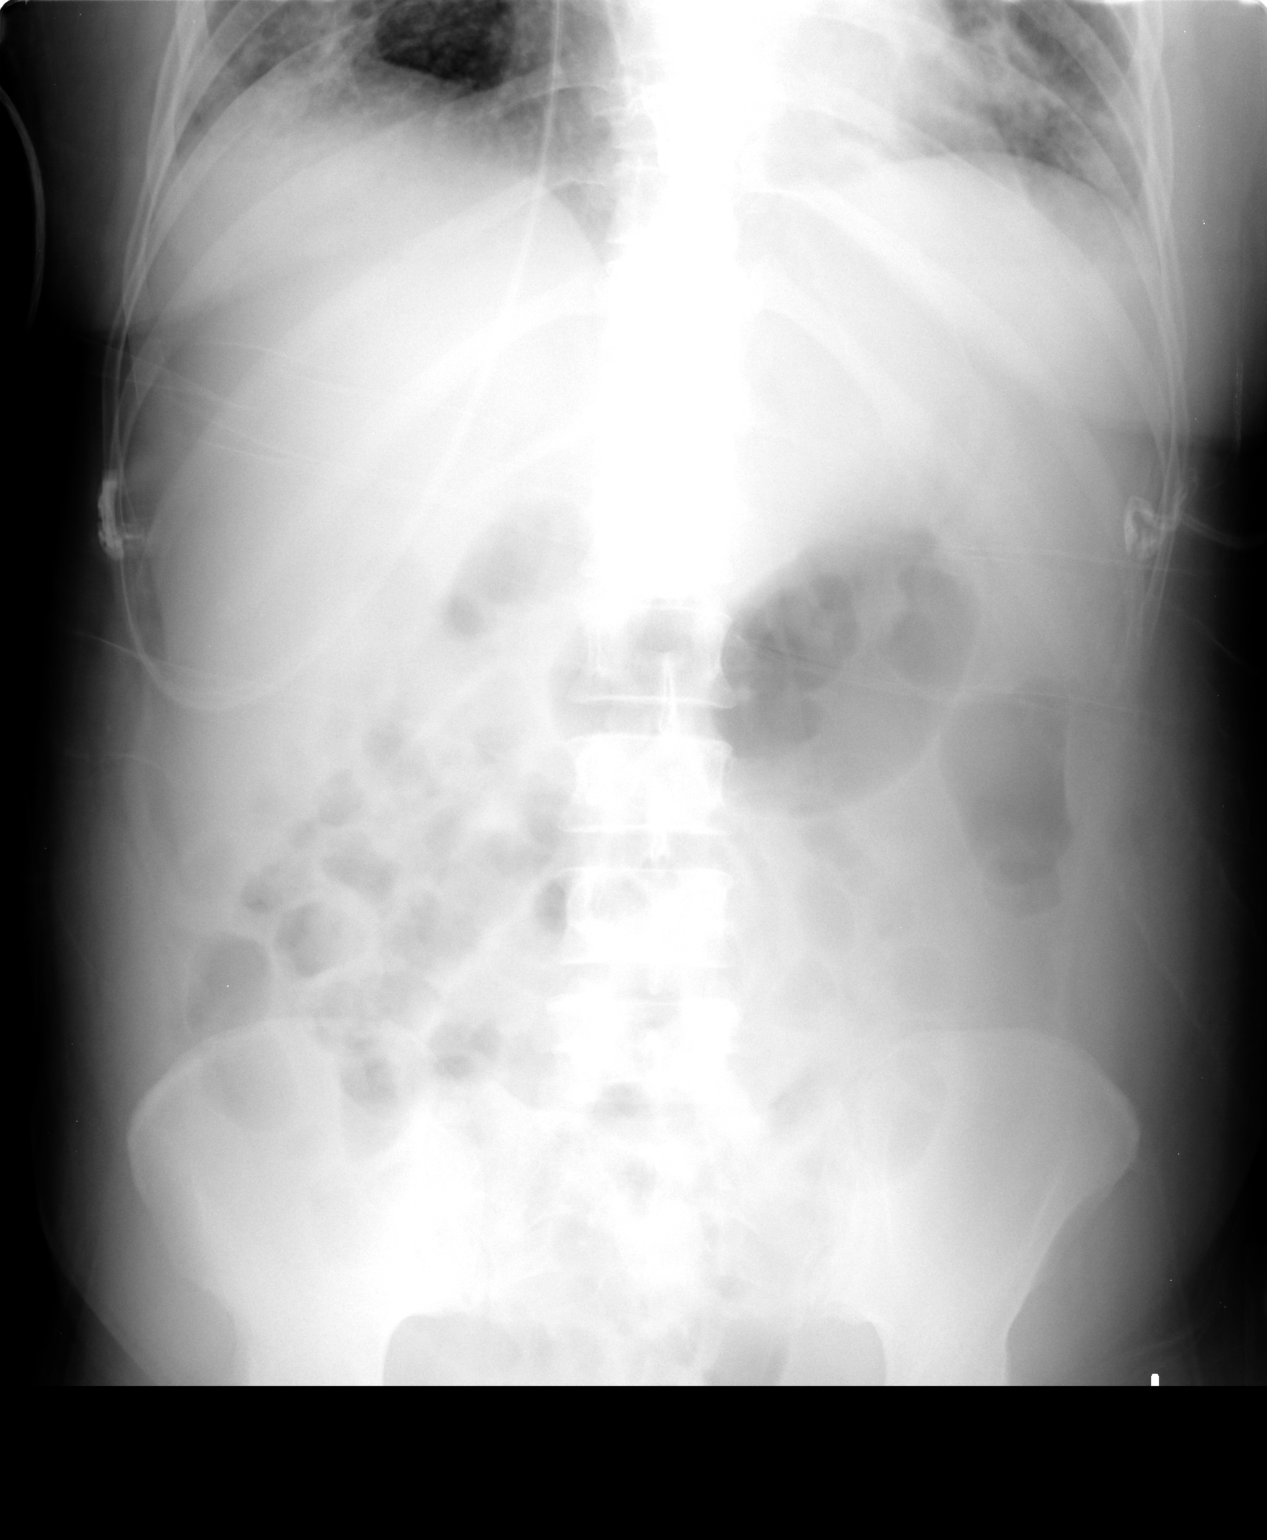

[1 of 1 positions shown; findings below may reference images not displayed]

FINDINGS: No visible feeding tube projects over the abdomen or lung
bases.
IMPRESSION: No visible evidence for feeding tube noted in the abdomen or lower
thorax.

## 2011-01-25 IMAGING — CR DG ABD PORTABLE 1V
1 series · 1 of 1 positions shown · non-contrast
Comparison: Film earlier today

CLINICAL DATA: Feeding tube placement

ABDOMEN - 1 VIEW

[view not recorded]
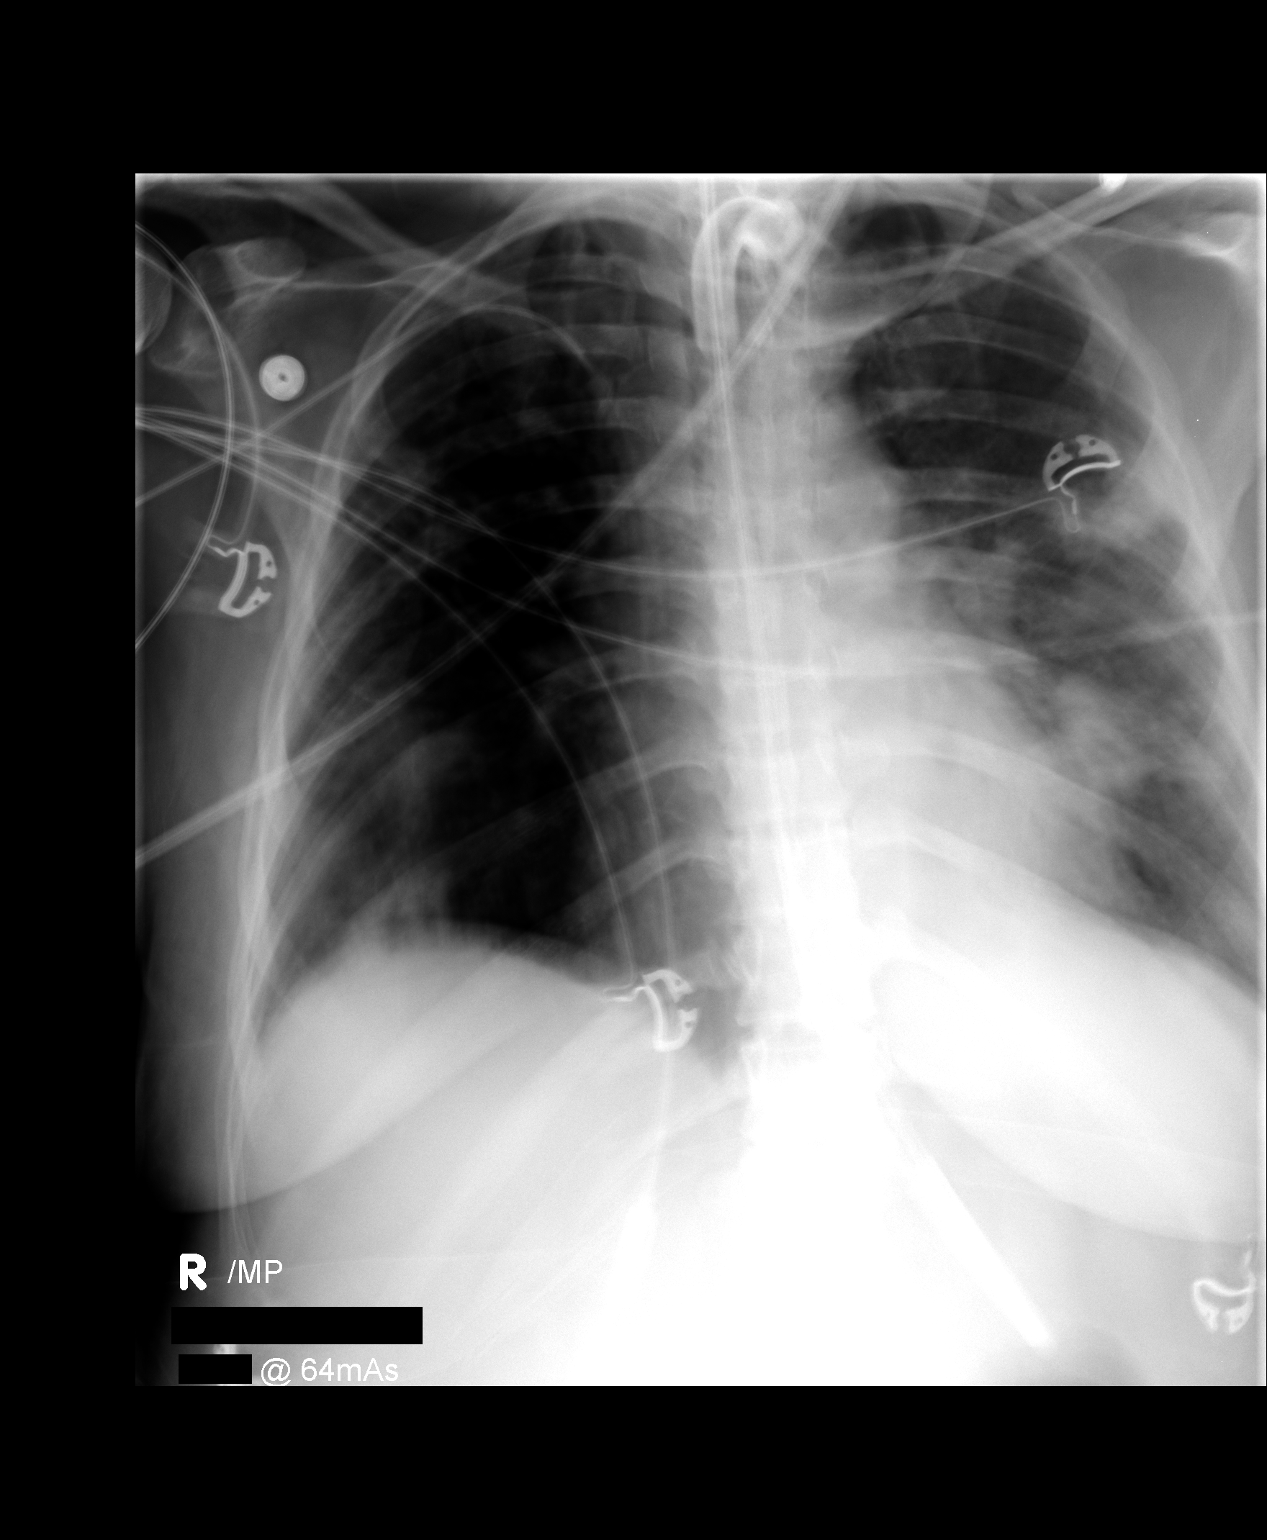

[1 of 1 positions shown; findings below may reference images not displayed]

FINDINGS: Film at 1186 hours demonstrates feeding tube extending
into the proximal stomach.
IMPRESSION: Feeding tube tip in proximal stomach.

## 2011-01-26 IMAGING — CR DG CHEST 1V PORT
1 series · 1 of 1 positions shown · non-contrast
Comparison: Chest radiograph 12/12/2009

CLINICAL DATA: Pneumonia, aspiration

PORTABLE CHEST - 1 VIEW

[view not recorded]
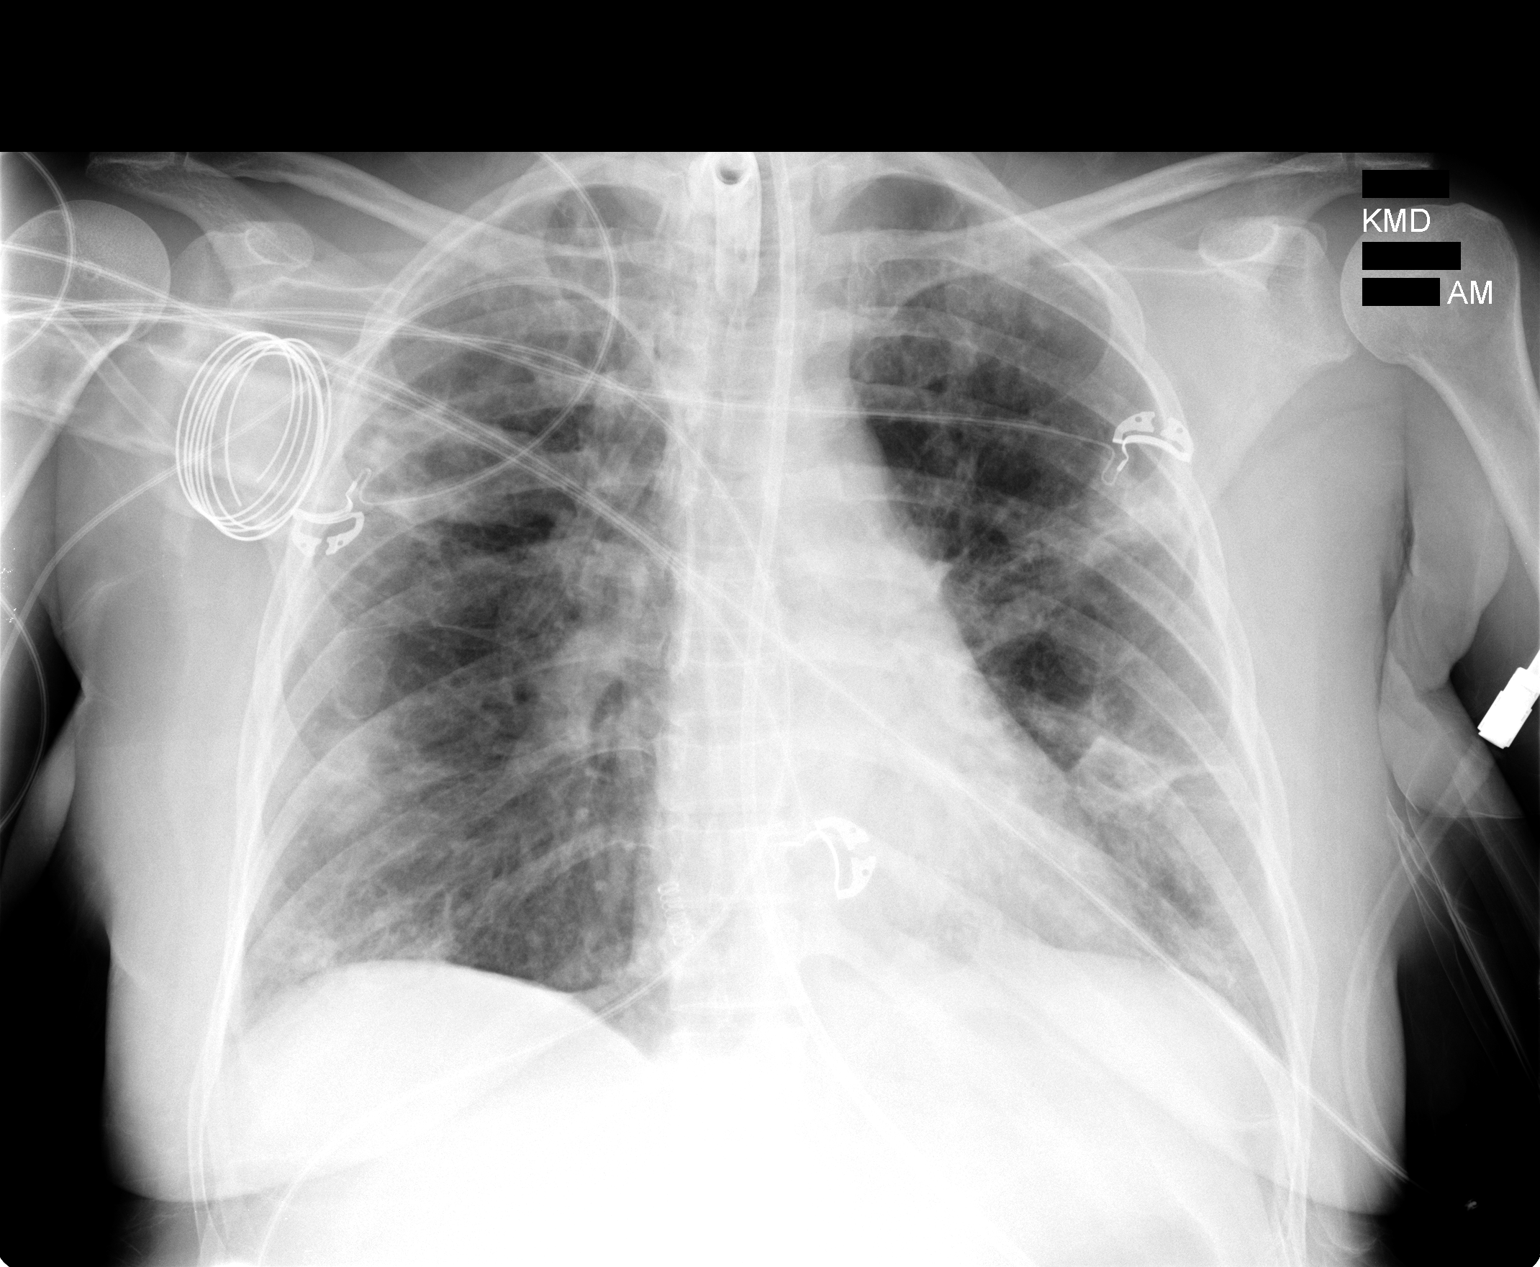

[1 of 1 positions shown; findings below may reference images not displayed]

FINDINGS: Tracheostomy tube and feeding tube and right central
venous line are unchanged.  There is improved aeration the lung
bases .  There is still patchy air space densities bilaterally.  No
pneumothorax.
IMPRESSION: 1.  Improvement in basilar atelectasis.
2.  Persistent patchy bilateral air space densities.

## 2011-01-27 ENCOUNTER — Inpatient Hospital Stay: Payer: Self-pay | Admitting: Emergency Medicine

## 2011-01-27 IMAGING — CR DG CHEST 1V PORT
1 series · 1 of 1 positions shown · non-contrast
Comparison: the previous day's study

CLINICAL DATA: Hypokalemia, pneumonia

PORTABLE CHEST - 1 VIEW

[view not recorded]
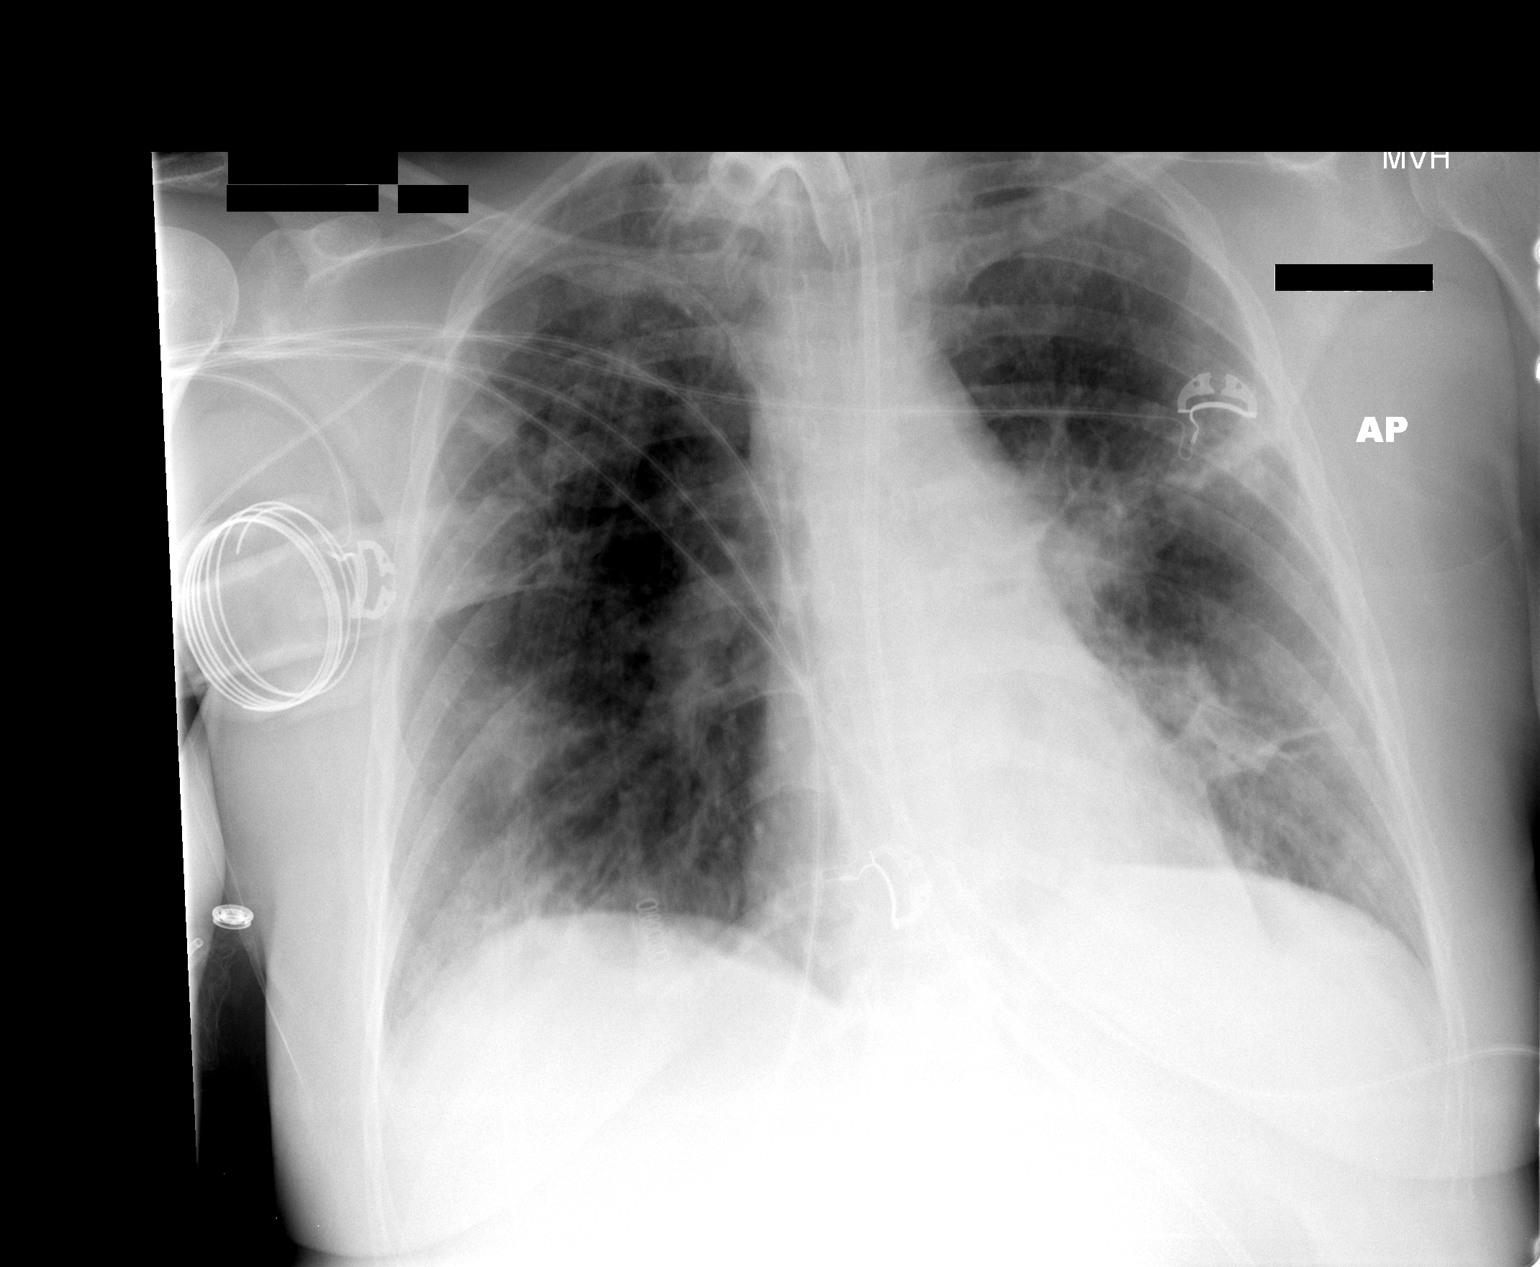

[1 of 1 positions shown; findings below may reference images not displayed]

FINDINGS: Tracheostomy, feeding tube, and right arm PICC are stable
in position.  There are persistent patchy airspace opacities
scattered throughout both lungs without significant change from the
previous exam.  No definite effusion.  Heart size upper limits
normal.  Mild interstitial edema or infiltrates stable.
IMPRESSION: 1.  Little change in asymmetric infiltrates or edema.
2. Support hardware stable in position.

## 2011-01-28 ENCOUNTER — Telehealth: Payer: Self-pay | Admitting: Emergency Medicine

## 2011-01-29 ENCOUNTER — Other Ambulatory Visit: Payer: Self-pay | Admitting: Neurosurgery

## 2011-01-29 DIAGNOSIS — M79605 Pain in left leg: Secondary | ICD-10-CM

## 2011-01-29 DIAGNOSIS — M5410 Radiculopathy, site unspecified: Secondary | ICD-10-CM

## 2011-01-29 DIAGNOSIS — M549 Dorsalgia, unspecified: Secondary | ICD-10-CM

## 2011-01-29 IMAGING — CR DG CHEST 1V PORT
1 series · 1 of 1 positions shown · non-contrast
Comparison: 12/23/2009 and earlier.

CLINICAL DATA: 43-year-old female with pneumonia.

PORTABLE CHEST - 1 VIEW

[view not recorded]
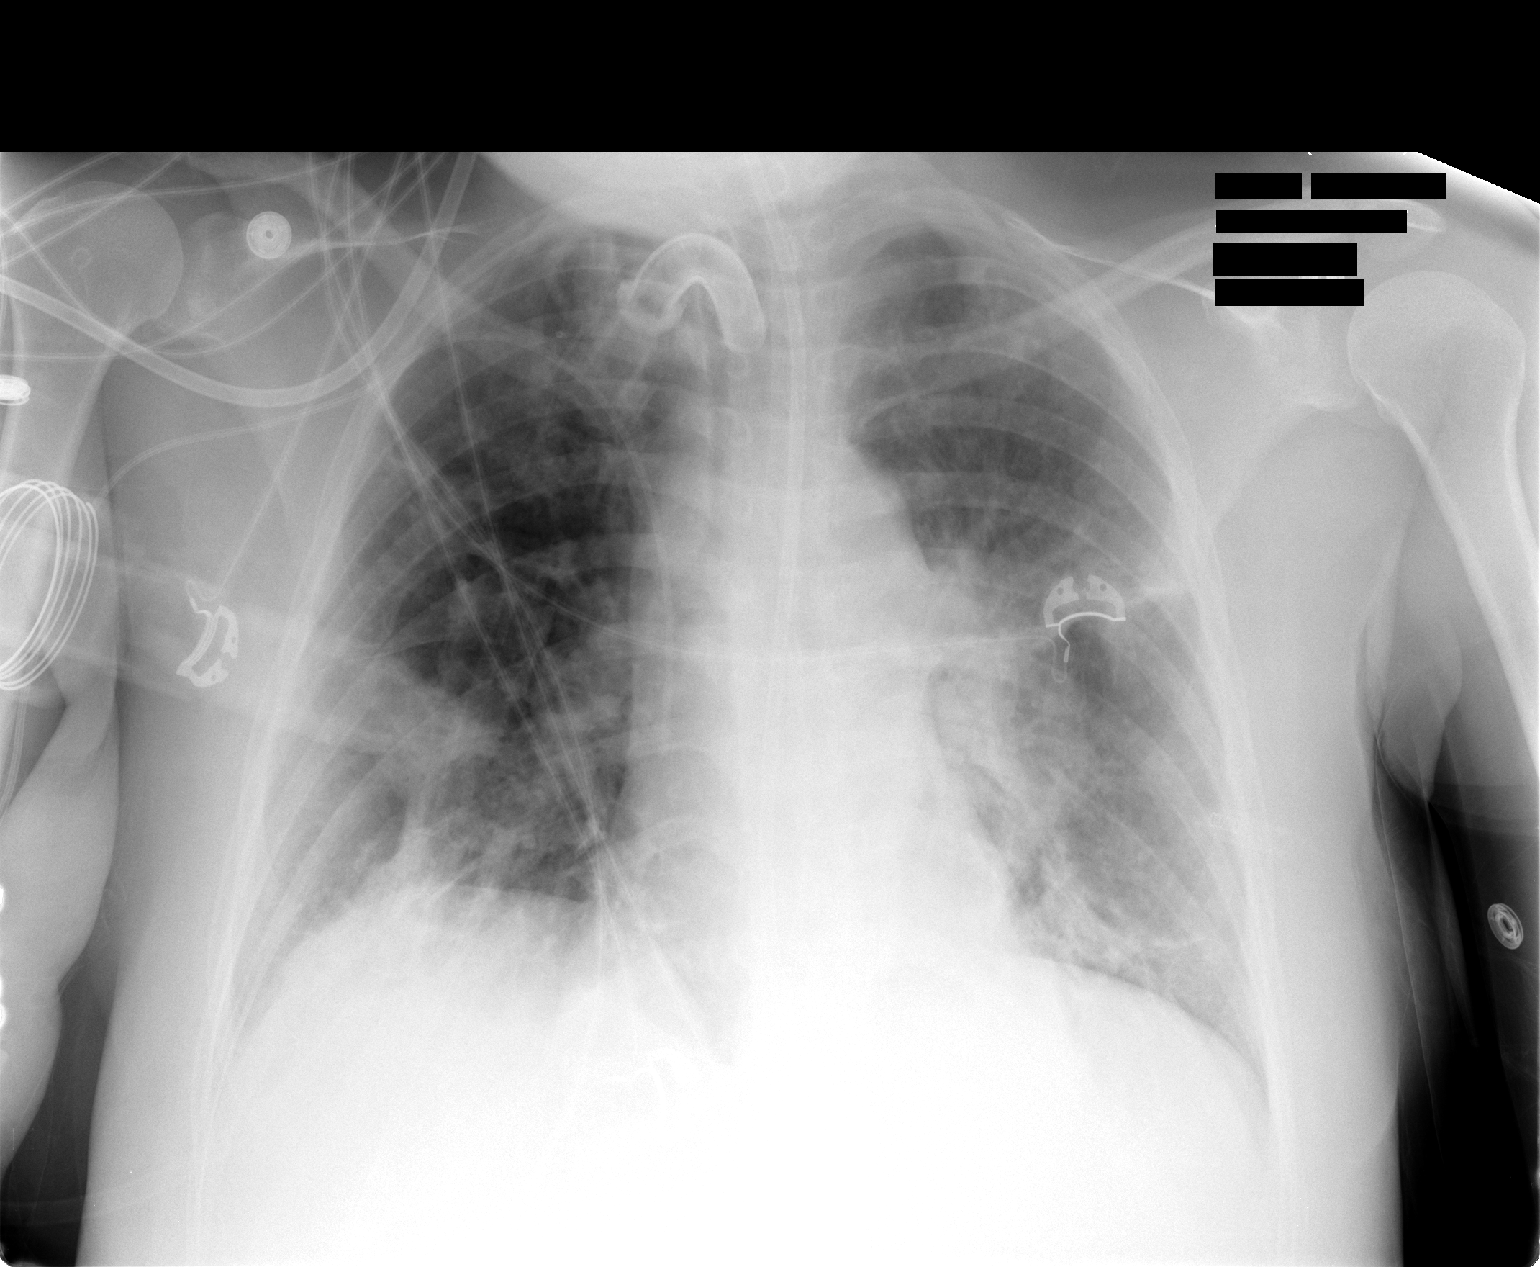

[1 of 1 positions shown; findings below may reference images not displayed]

FINDINGS: Portable semi upright AP view 1481 hours.  Stable
tracheostomy tube, right PICC line, and visualized enteric feeding
tube.  Stable lung volumes.  Left greater than right perihilar and
infrahilar streaky opacities without significant change since
12/20/2009.  No areas of worsening ventilation.  No large effusion.
IMPRESSION: 1. Stable lines and tubes.
2.  Unchanged left greater than right perihilar and infrahilar
streaky opacity.

## 2011-01-29 IMAGING — CR DG ABD PORTABLE 1V
1 series · 1 of 1 positions shown · non-contrast
Comparison: 12/20/2009.

CLINICAL DATA: Feeding tube placement.

ABDOMEN - 1 VIEW

[view not recorded]
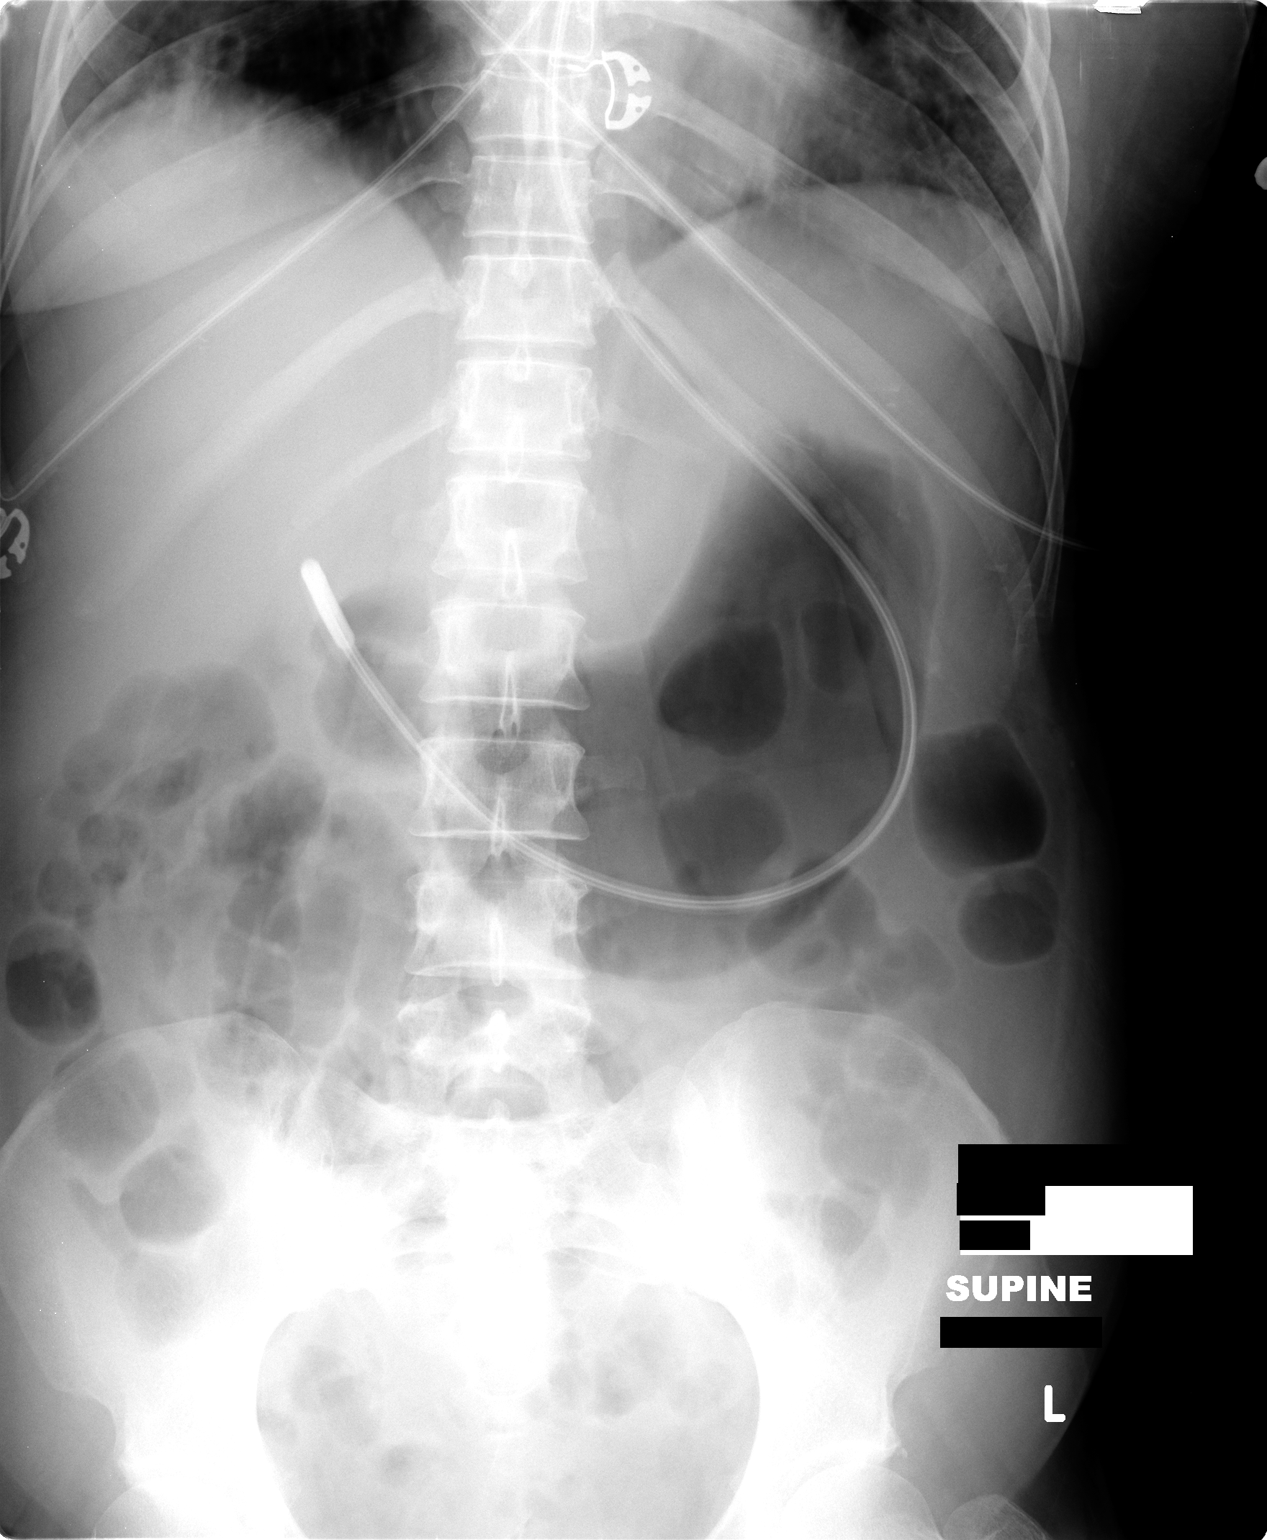

[1 of 1 positions shown; findings below may reference images not displayed]

FINDINGS: Normal bowel gas pattern.  Feeding tube tip in the
duodenal bulb.  Unremarkable bones.
IMPRESSION: Feeding tube tip in the duodenal bulb.

## 2011-01-30 IMAGING — CR DG CHEST 1V PORT
1 series · 1 of 1 positions shown · non-contrast
Comparison: 12/24/2009 and earlier.

CLINICAL DATA: 43-year-old female with respiratory distress.

PORTABLE CHEST - 1 VIEW

[view not recorded]
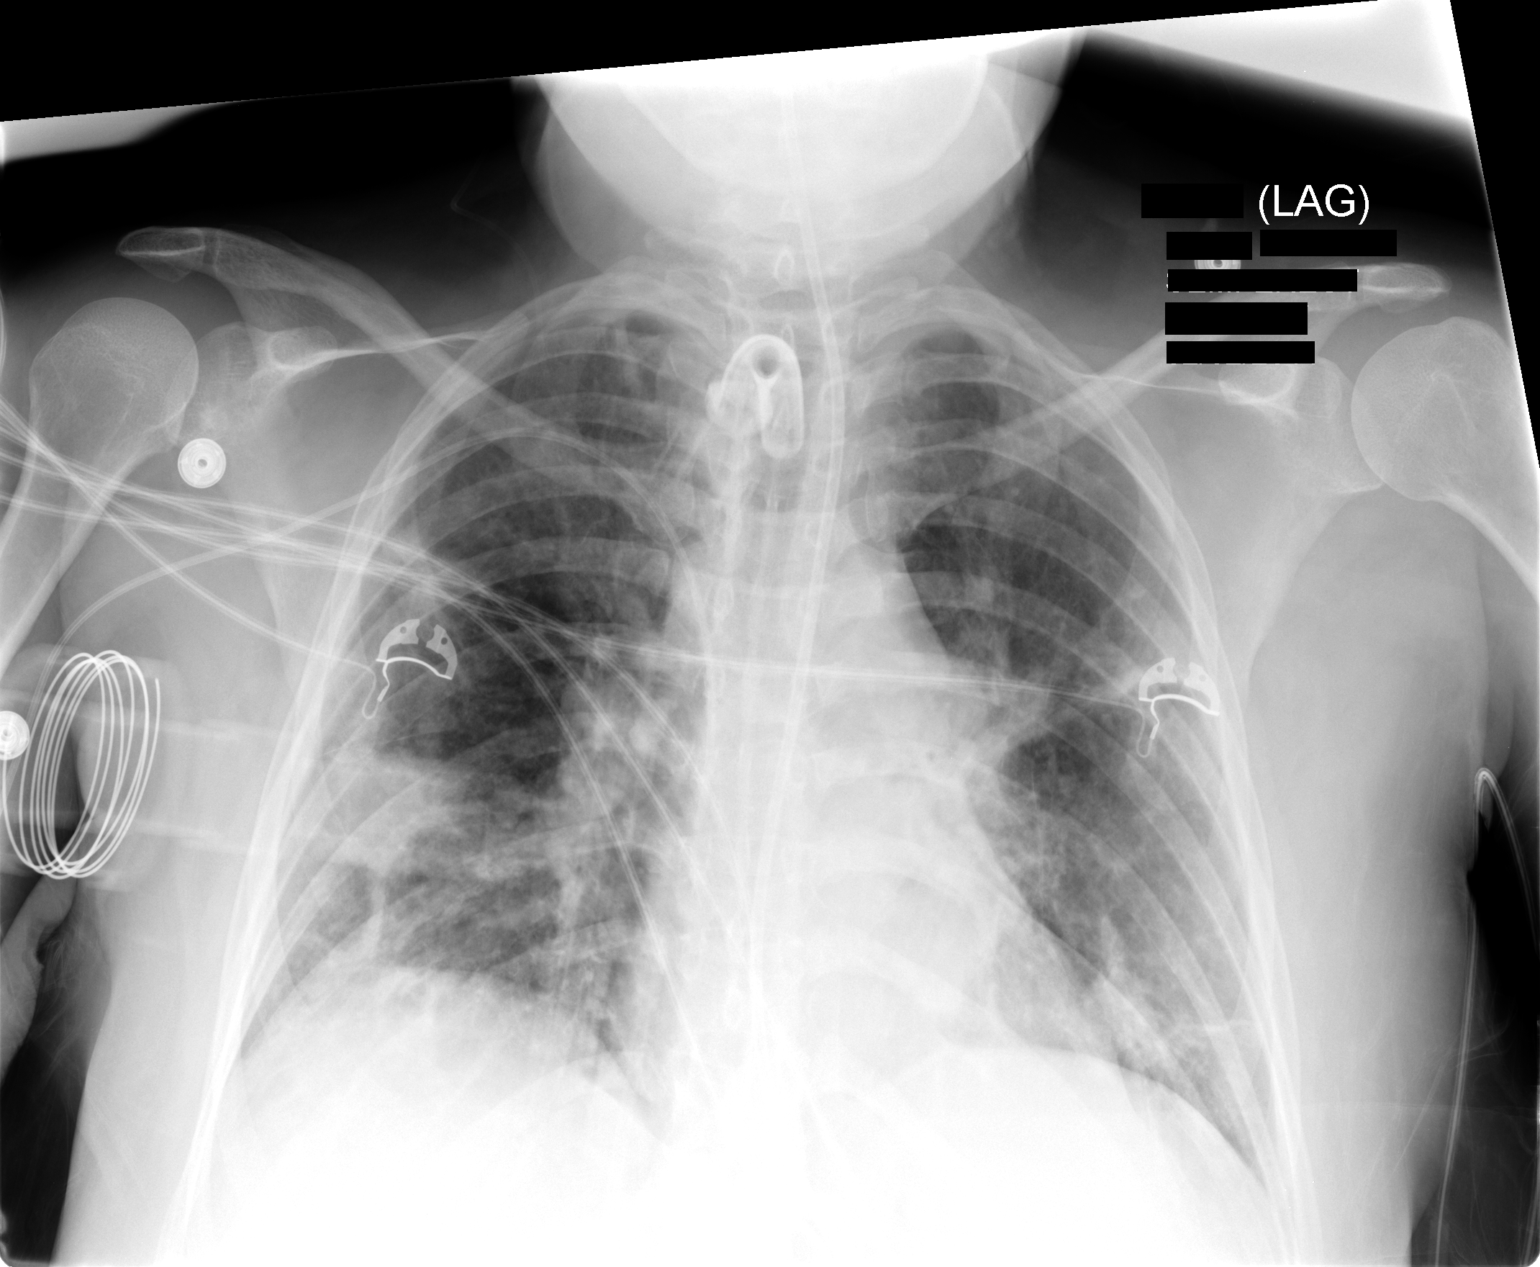

[1 of 1 positions shown; findings below may reference images not displayed]

FINDINGS: Portable semi upright AP view 7577 hours.  Stable
tracheostomy tube, right PICC line, and visualized feeding tube.
Stable lung volumes.  Mildly diminished left perihilar opacity.
Ongoing streaky infrahilar opacity.  Stable cardiac size and
mediastinal contours.  No pneumothorax or significant effusion.
IMPRESSION: 1. Stable lines and tubes.
2.  Mildly decreased perihilar opacity, ongoing streaky bibasilar
opacity.

## 2011-01-31 IMAGING — CR DG CHEST 1V PORT
1 series · 1 of 1 positions shown · non-contrast
Comparison: 12/25/2009 and earlier.

CLINICAL DATA: 43-year-old female with respiratory distress.

PORTABLE CHEST - 1 VIEW

[view not recorded]
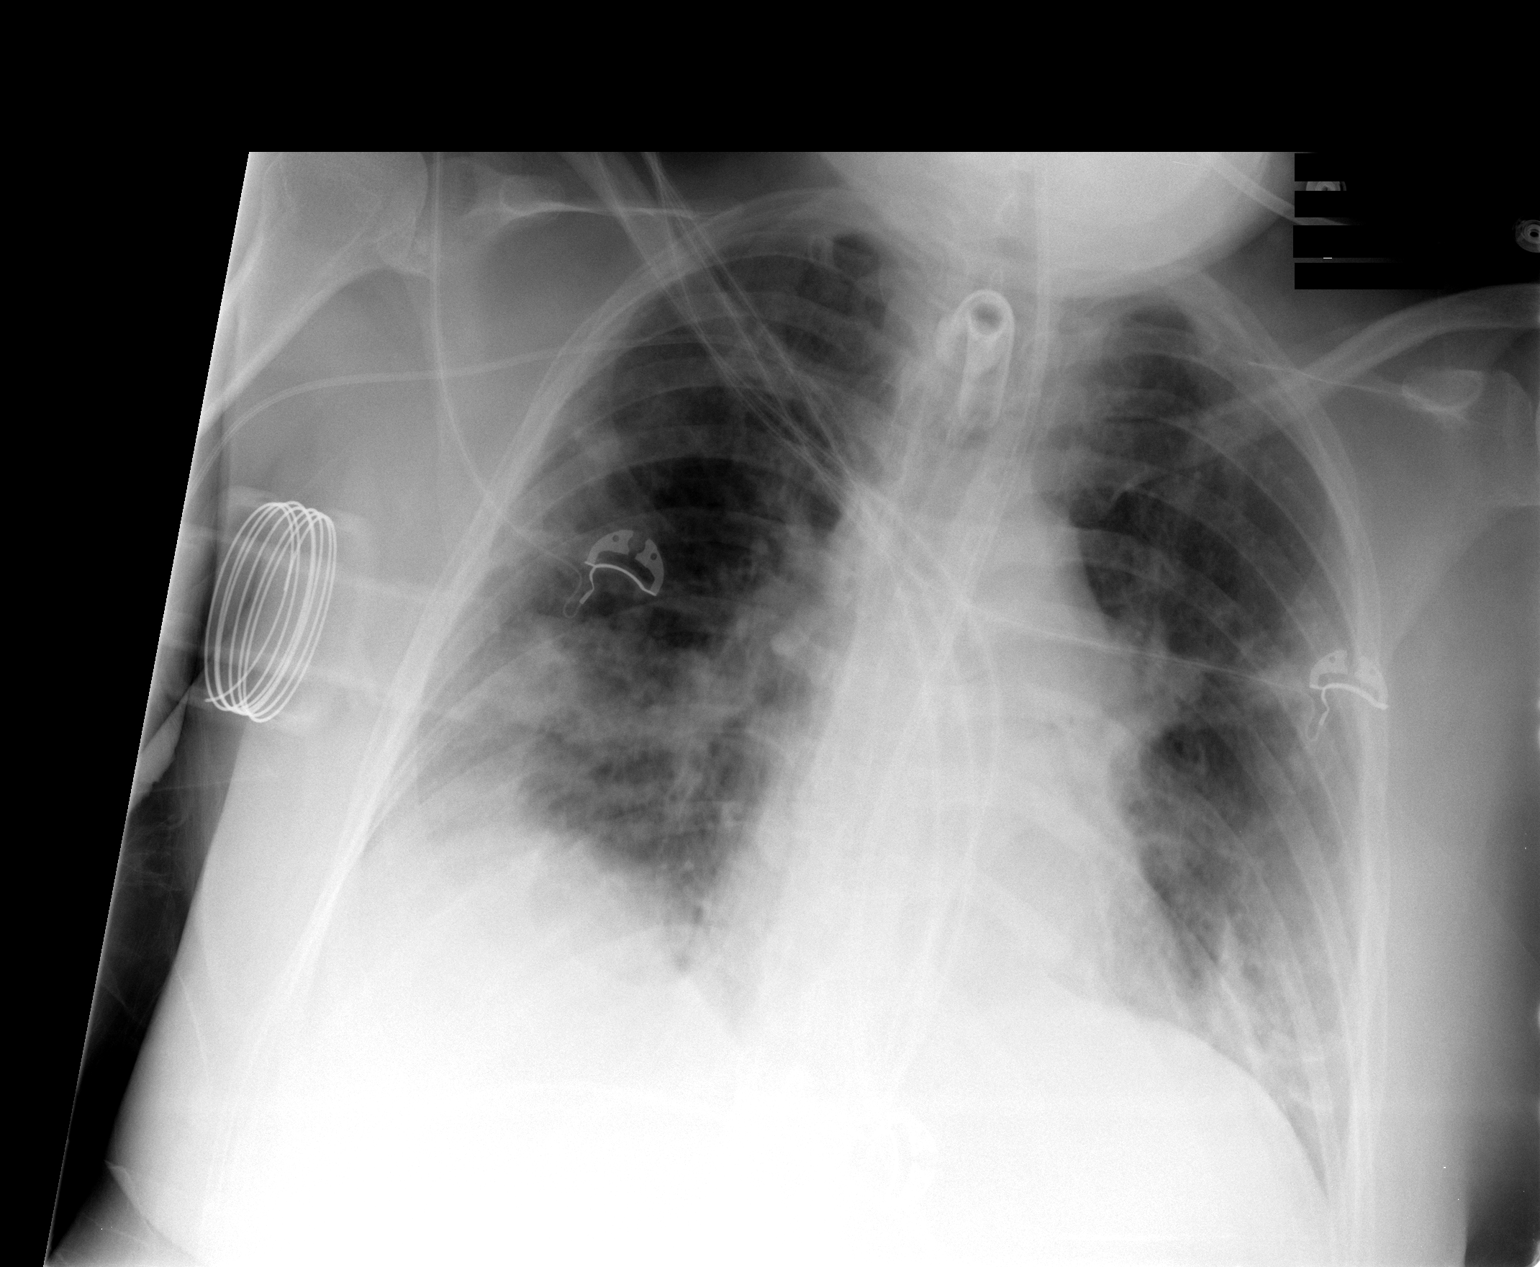

[1 of 1 positions shown; findings below may reference images not displayed]

FINDINGS: Portable semi upright AP view 1011 hours.  Mild
respiratory motion artifact.  Stable tracheostomy tube and
visualized enteric tube.  Stable right PICC line.  No significant
change in streaky bibasilar opacity.  No pneumothorax or definite
pleural effusion.  Stable cardiac size and mediastinal contours.
IMPRESSION: 1. Stable lines and tubes.
2.  Unchanged streaky bibasilar opacity.

## 2011-02-01 IMAGING — CR DG CHEST 1V PORT
1 series · 1 of 1 positions shown · non-contrast
Comparison: [HOSPITAL] portable chest x-rays 12/25/2009
and 12/26/2009.

CLINICAL DATA: Pneumonia, hyperkaliemia.

PORTABLE CHEST - 1 VIEW

[view not recorded]
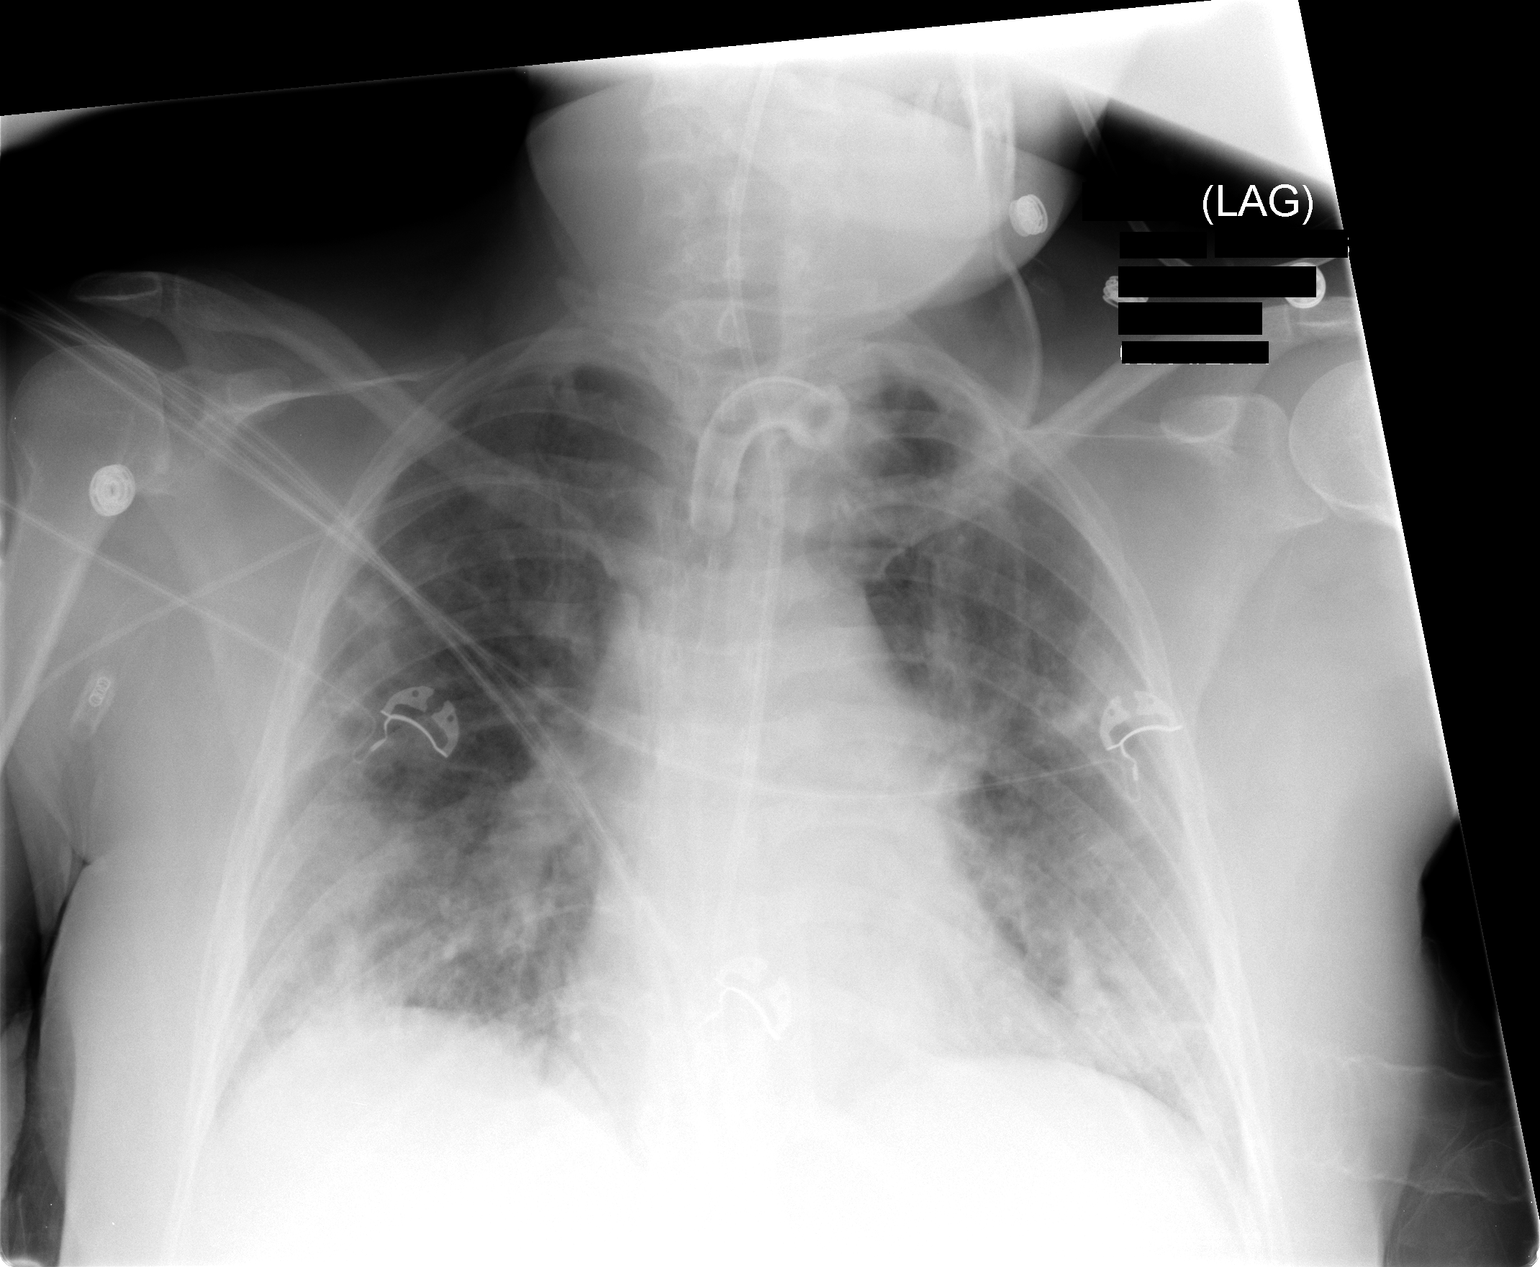

[1 of 1 positions shown; findings below may reference images not displayed]

FINDINGS: Since 12/26/2009 low lung volumes seen with slightly
progressive bibasilar atelectasis superimposed upon stable
bibasilar airspace opacity.  Heart size remains normal.  Peripheral
vascularity remains upper limits of normal.  Tracheostomy, feeding
tube and right PICC line positions stable without pneumothorax.
IMPRESSION: 1.  Lower lung volumes with progressive bibasilar atelectasis
superimposed upon stable bibasilar airspace opacities.
2.  Stable support apparatus.
3.  No new acute findings otherwise.

## 2011-02-02 LAB — BASIC METABOLIC PANEL WITH GFR
BUN: 8 mg/dL (ref 6–23)
CO2: 31 meq/L (ref 19–32)
Calcium: 9.3 mg/dL (ref 8.4–10.5)
Chloride: 104 meq/L (ref 96–112)
Creatinine, Ser: 0.68 mg/dL (ref 0.4–1.2)
GFR calc non Af Amer: >60 mL/min
Glucose, Bld: 104 mg/dL — ABNORMAL HIGH (ref 70–99)
Potassium: 3.6 meq/L (ref 3.5–5.1)
Sodium: 142 meq/L (ref 135–145)

## 2011-02-02 LAB — CBC
HCT: 39.8 % (ref 36.0–46.0)
Hemoglobin: 12.8 g/dL (ref 12.0–15.0)
MCH: 25.8 pg — ABNORMAL LOW (ref 26.0–34.0)
MCH: 26.5 pg (ref 26.0–34.0)
MCHC: 32.2 g/dL (ref 30.0–36.0)
MCHC: 33.1 g/dL (ref 30.0–36.0)
MCV: 80.2 fL (ref 78.0–100.0)
MCV: 80.3 fL (ref 78.0–100.0)
Platelets: 402 10*3/uL — ABNORMAL HIGH (ref 150–400)
Platelets: 420 10*3/uL — ABNORMAL HIGH (ref 150–400)
Platelets: 436 10*3/uL — ABNORMAL HIGH (ref 150–400)
RBC: 4.76 MIL/uL (ref 3.87–5.11)
RBC: 4.96 MIL/uL (ref 3.87–5.11)
RBC: 5.29 MIL/uL — ABNORMAL HIGH (ref 3.87–5.11)
RDW: 16 % — ABNORMAL HIGH (ref 11.5–15.5)
WBC: 12 10*3/uL — ABNORMAL HIGH (ref 4.0–10.5)
WBC: 31.2 10*3/uL — ABNORMAL HIGH (ref 4.0–10.5)

## 2011-02-02 LAB — BASIC METABOLIC PANEL
Chloride: 102 mEq/L (ref 96–112)
Chloride: 102 mEq/L (ref 96–112)
Creatinine, Ser: 0.6 mg/dL (ref 0.4–1.2)
GFR calc Af Amer: >60 mL/min (ref >60)
GFR calc Af Amer: >60 mL/min (ref >60)
GFR calc non Af Amer: >60 mL/min (ref >60)
Potassium: 4.4 mEq/L (ref 3.5–5.1)
Potassium: 4.5 mEq/L (ref 3.5–5.1)

## 2011-02-02 LAB — URINALYSIS, ROUTINE W REFLEX MICROSCOPIC
Bilirubin Urine: NEGATIVE
Glucose, UA: NEGATIVE mg/dL
Hgb urine dipstick: NEGATIVE
Ketones, ur: NEGATIVE mg/dL
Nitrite: NEGATIVE
Protein, ur: NEGATIVE mg/dL
Specific Gravity, Urine: 1.016 (ref 1.005–1.030)
Urobilinogen, UA: 0.2 mg/dL (ref 0.0–1.0)
pH: 7 (ref 5.0–8.0)

## 2011-02-02 LAB — DIFFERENTIAL
Basophils Absolute: 0 10*3/uL (ref 0.0–0.1)
Basophils Relative: 0 % (ref 0–1)
Eosinophils Absolute: 0.2 10*3/uL (ref 0.0–0.7)
Eosinophils Relative: 1 % (ref 0–5)
Lymphocytes Relative: 19 % (ref 12–46)
Lymphs Abs: 3.2 10*3/uL (ref 0.7–4.0)
Monocytes Absolute: 1.1 10*3/uL — ABNORMAL HIGH (ref 0.1–1.0)
Monocytes Relative: 6 % (ref 3–12)
Neutro Abs: 12.7 10*3/uL — ABNORMAL HIGH (ref 1.7–7.7)
Neutrophils Relative %: 74 % (ref 43–77)

## 2011-02-02 LAB — CULTURE, BLOOD (ROUTINE X 2)
Culture  Setup Time: 201112020142
Culture: NO GROWTH
Culture: NO GROWTH

## 2011-02-02 LAB — GLUCOSE, CAPILLARY
Glucose-Capillary: 203 mg/dL — ABNORMAL HIGH (ref 70–99)
Glucose-Capillary: 274 mg/dL — ABNORMAL HIGH (ref 70–99)
Glucose-Capillary: 379 mg/dL — ABNORMAL HIGH (ref 70–99)
Glucose-Capillary: 418 mg/dL — ABNORMAL HIGH (ref 70–99)

## 2011-02-02 LAB — MRSA PCR SCREENING: MRSA by PCR: NEGATIVE

## 2011-02-02 LAB — POCT CARDIAC MARKERS
CKMB, poc: 1.7 ng/mL (ref 1.0–8.0)
Myoglobin, poc: 72.6 ng/mL (ref 12–200)
Troponin i, poc: <0.05 ng/mL (ref 0.00–0.09)

## 2011-02-02 LAB — BRAIN NATRIURETIC PEPTIDE: Pro B Natriuretic peptide (BNP): <30 pg/mL (ref 0.0–100.0)

## 2011-02-02 IMAGING — CR DG CHEST 1V PORT
1 series · 1 of 1 positions shown · non-contrast
Comparison: 12/27/2009

CLINICAL DATA: Pneumonia and respiratory failure.

PORTABLE CHEST - 1 VIEW

[view not recorded]
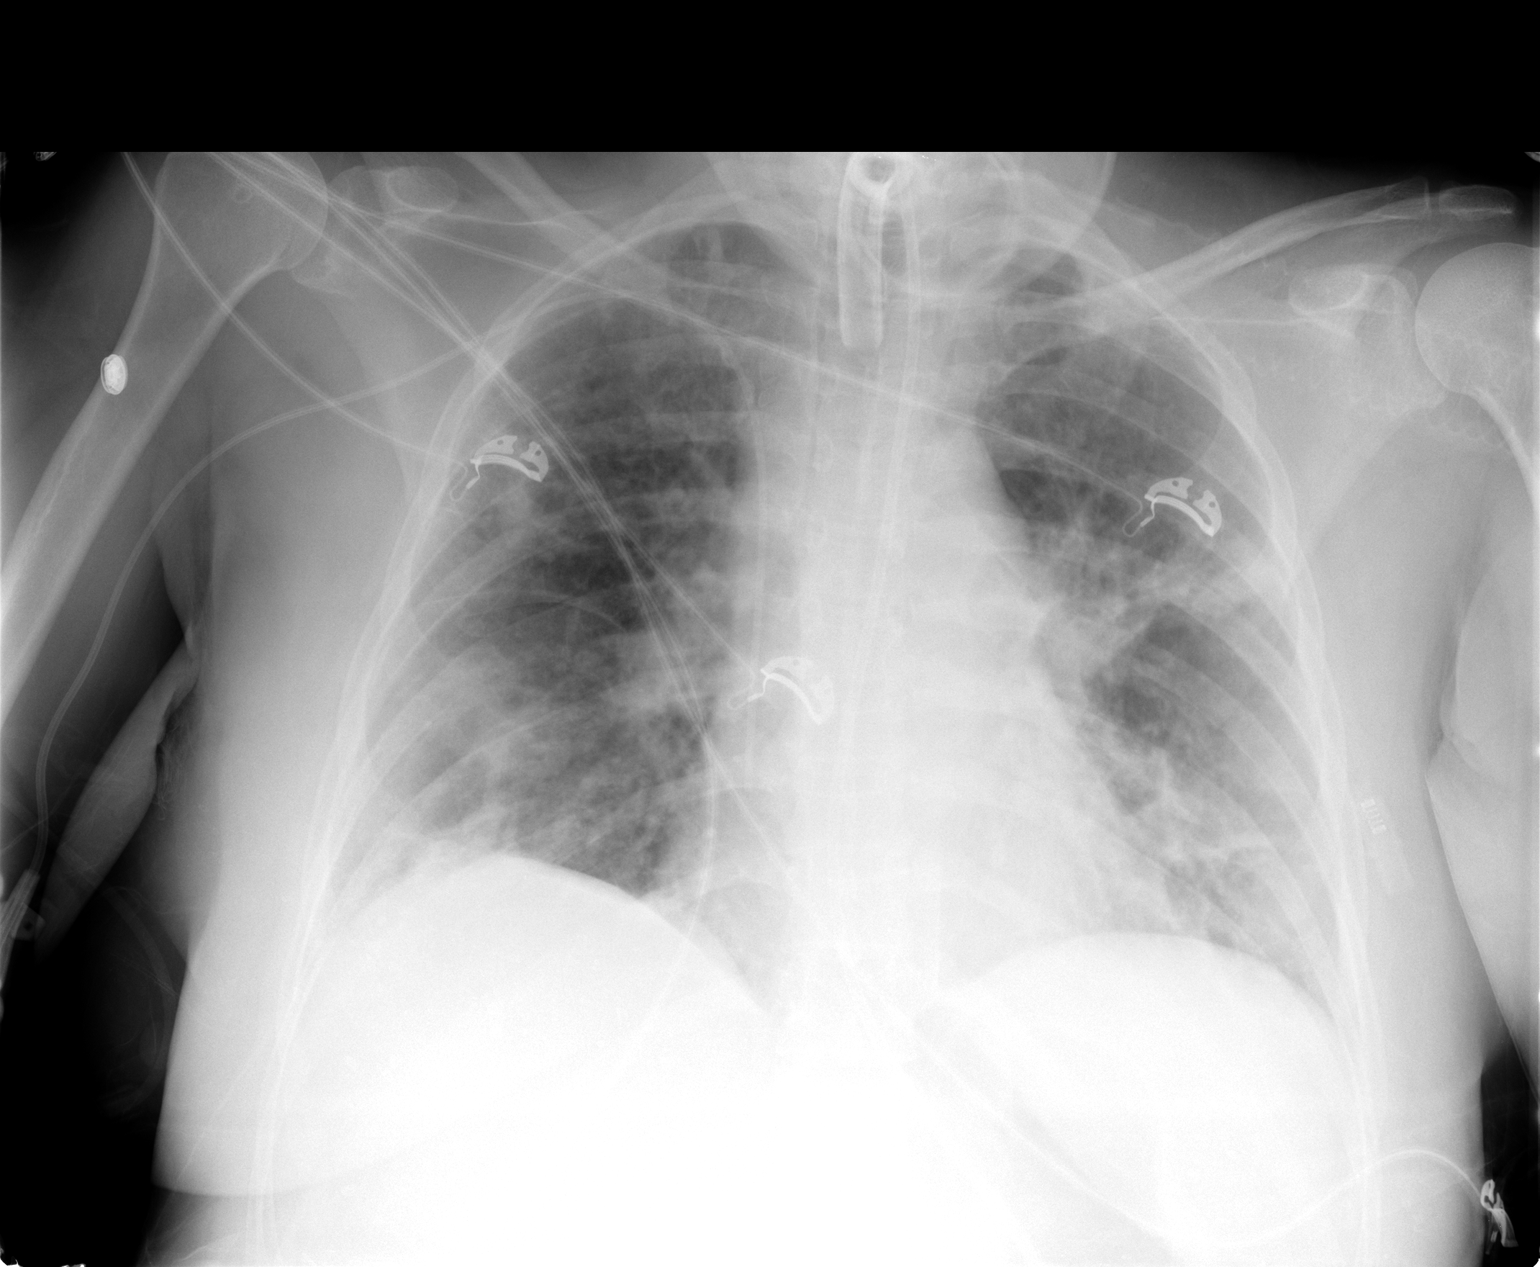

[1 of 1 positions shown; findings below may reference images not displayed]

FINDINGS: Tracheostomy tube and right PICC line appear stable.
Residual areas of bilateral atelectasis and infiltrates are without
significant change.  No overt edema.
IMPRESSION: Stable appearance of chest.

## 2011-02-03 LAB — URINALYSIS, ROUTINE W REFLEX MICROSCOPIC
Leukocytes, UA: NEGATIVE
Nitrite: NEGATIVE
Specific Gravity, Urine: 1.01 (ref 1.005–1.030)
Urobilinogen, UA: 0.2 mg/dL (ref 0.0–1.0)
pH: 7 (ref 5.0–8.0)

## 2011-02-03 LAB — CBC
HCT: 38.7 % (ref 36.0–46.0)
Hemoglobin: 12.6 g/dL (ref 12.0–15.0)
MCH: 25.9 pg — ABNORMAL LOW (ref 26.0–34.0)
MCH: 26.3 pg (ref 26.0–34.0)
MCHC: 32.3 g/dL (ref 30.0–36.0)
MCV: 80.2 fL (ref 78.0–100.0)
MCV: 80.5 fL (ref 78.0–100.0)
Platelets: 345 10*3/uL (ref 150–400)
Platelets: 416 10*3/uL — ABNORMAL HIGH (ref 150–400)
RBC: 4.8 MIL/uL (ref 3.87–5.11)
RDW: 17 % — ABNORMAL HIGH (ref 11.5–15.5)
WBC: 15.9 10*3/uL — ABNORMAL HIGH (ref 4.0–10.5)

## 2011-02-03 LAB — DIFFERENTIAL
Basophils Relative: 1 % (ref 0–1)
Eosinophils Absolute: 0.2 10*3/uL (ref 0.0–0.7)
Eosinophils Relative: 1 % (ref 0–5)
Eosinophils Relative: 1 % (ref 0–5)
Lymphocytes Relative: 20 % (ref 12–46)
Lymphs Abs: 3.1 10*3/uL (ref 0.7–4.0)
Monocytes Relative: 5 % (ref 3–12)
Neutrophils Relative %: 71 % (ref 43–77)

## 2011-02-03 LAB — BASIC METABOLIC PANEL
BUN: 6 mg/dL (ref 6–23)
CO2: 27 mEq/L (ref 19–32)
Calcium: 8.2 mg/dL — ABNORMAL LOW (ref 8.4–10.5)
Creatinine, Ser: 0.55 mg/dL (ref 0.4–1.2)
GFR calc Af Amer: >60 mL/min (ref >60)
Glucose, Bld: 100 mg/dL — ABNORMAL HIGH (ref 70–99)

## 2011-02-03 LAB — URINE MICROSCOPIC-ADD ON

## 2011-02-03 LAB — GC/CHLAMYDIA PROBE AMP, GENITAL: GC Probe Amp, Genital: NEGATIVE

## 2011-02-03 LAB — PREGNANCY, URINE: Preg Test, Ur: NEGATIVE

## 2011-02-03 NOTE — Progress Notes (Signed)
Summary: nos appt  Phone Note Call from Patient   Caller: [email or web address]  Call For: [name] Summary of Call: LMTCB x2 to rsc nos from 3/6. Initial call taken by: Darletta Moll,  January 28, 2011 2:48 PM

## 2011-02-04 ENCOUNTER — Ambulatory Visit
Admission: RE | Admit: 2011-02-04 | Discharge: 2011-02-04 | Disposition: A | Discharge status: Home / Self Care | Payer: Medicaid Other | Source: Ambulatory Visit | Attending: Neurosurgery | Admitting: Neurosurgery

## 2011-02-04 DIAGNOSIS — M5410 Radiculopathy, site unspecified: Secondary | ICD-10-CM

## 2011-02-04 DIAGNOSIS — M549 Dorsalgia, unspecified: Secondary | ICD-10-CM

## 2011-02-04 DIAGNOSIS — M79605 Pain in left leg: Secondary | ICD-10-CM

## 2011-02-05 NOTE — Op Note (Signed)
  NAME:  [name], [name]                 ACCOUNT NO.:  [id number]  MEDICAL RECORD NO.:  [id number]           PATIENT TYPE:  O  LOCATION:  DAYP                          FACILITY:  [facility]  PHYSICIAN:  Lazaro Arms, M.D.   DATE OF BIRTH:  1966/11/08  DATE OF PROCEDURE:  01/21/2011 DATE OF DISCHARGE:                              OPERATIVE REPORT   PREOPERATIVE DIAGNOSES: 1. Menometrorrhagia. 2. Dysmenorrhea.  POSTOPERATIVE DIAGNOSES: 1. Menometrorrhagia. 2. Dysmenorrhea.  PROCEDURES:  Hysteroscopy, dilation and curettage and endometrial ablation.  SURGEON:  Lazaro Arms, MD  ANESTHESIA:  Spinal.  FINDINGS:  Normal endometrium.  No polyps, no fibroids, no abnormalities.  DESCRIPTION OF OPERATION:  The patient was taken to the operating room, placed in the sitting position where she underwent a spinal anesthetic. She was then placed in low lithotomy position, prepped and draped in usual sterile fashion.  Graves speculum was placed.  The cervix was grasped and cervix was dilated serially to allow passage of the hysteroscope.  Diagnostic hysteroscopy was performed and was found to be normal.  Vigorous uterine curettage was then performed.  Minimal amount of tissue was returned.  ThermaChoice III endometrial ablation balloon was used, 17 mL of D5W was required to maintain a pressure between 192 mmHg throughout the procedure.  Total therapy time was 8 minutes and 51 seconds.  All the equipment worked properly during the procedure.  All the fluids returned in end of the procedure.  There was minimal bleeding.  The patient received Ancef and Toradol prophylactically.     Lazaro Arms, M.D.    Loraine Maple  D:  01/21/2011  T:  01/21/2011  Job:  [id number]  Electronically Signed by Duane Lope M.D. on 02/05/2011 07:43:18 AM

## 2011-02-06 IMAGING — CR DG ABD PORTABLE 1V
1 series · 1 of 1 positions shown · non-contrast
Comparison: 12/24/2009

CLINICAL DATA: Panda placement.

ABDOMEN - 1 VIEW

[view not recorded]
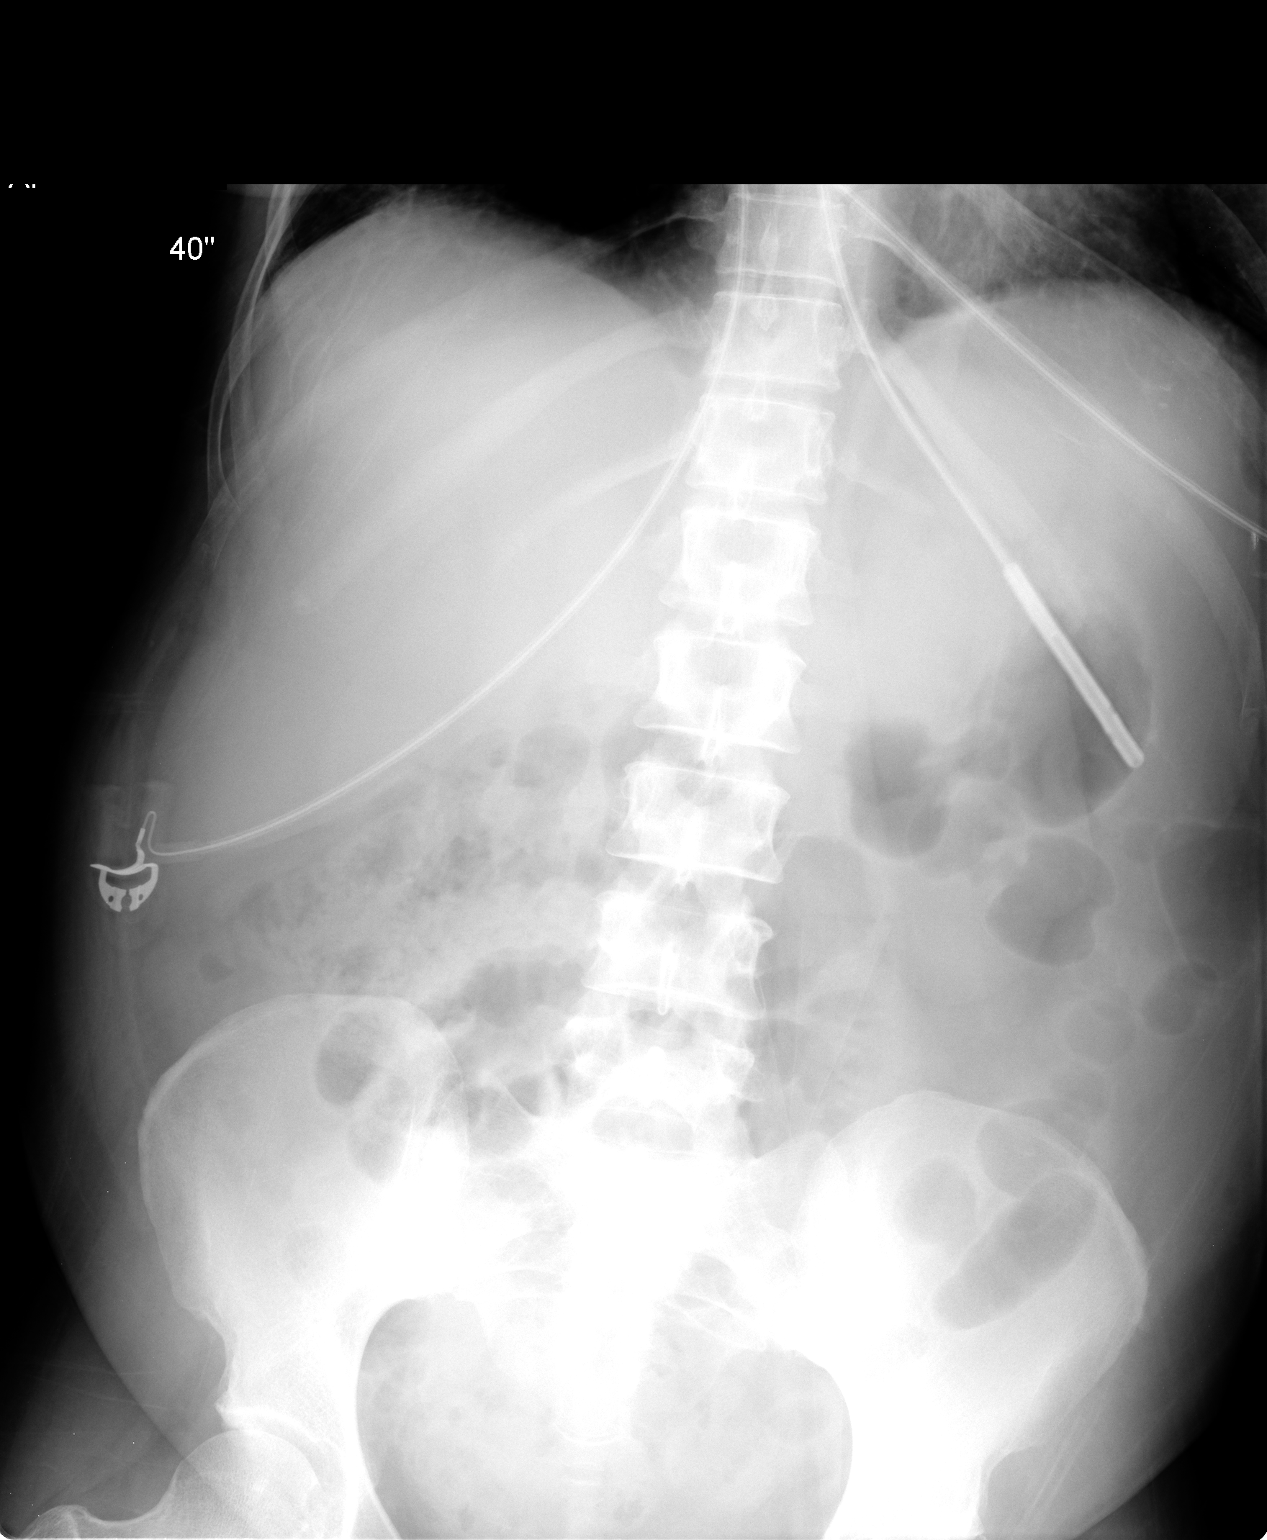

[1 of 1 positions shown; findings below may reference images not displayed]

FINDINGS: The Panda tube tip is in the body region of the stomach.
The bowel gas pattern is unremarkable.
IMPRESSION: Panda tube tip in the body region of the stomach.

## 2011-02-07 NOTE — H&P (Signed)
NAMEMARASIA, [name]                 ACCOUNT NO.:  [id number]  MEDICAL RECORD NO.:  [id number]          PATIENT TYPE:  INP  LOCATION:  1603                         FACILITY:  [facility]  PHYSICIAN:  Jenetta Loges, MD      DATE OF BIRTH:  [birth year]  DATE OF ADMISSION:  11/30/2010 DATE OF DISCHARGE:  11/23/2010                             HISTORY & PHYSICAL   CHIEF COMPLAINT:  Shortness of breath.  HISTORY OF PRESENT ILLNESS:  [name] is a 45 year old white female with past medical history significant for COPD, ongoing tobacco abuse, and history of ARDS with necrotizing pneumonia requiring since this hospitalization in January 2011, who presents to the ED, complaining of shortness of breath and cough for the last 6 days.  The patient states she has had progressive dyspnea on exertion, shortness of breath at rest, wheeze, and cough productive yellow sputum over the past several days.  She has been using her albuterol 4-6 times a day without relief of her symptoms.  Today, she notes streaks of blood in her sputum, so she came into the emergency department.  She admits to having subjective fever, chills, nausea, and chest heaviness with her shortness of breath. In the ED, the patient was tachycardic, tachypneic on admission.  She was placed on supplemental oxygen.  She received to continue this neb along with magnesium sulfate IV, Solu-Medrol 125 mg IV x1.  With these treatments, the patient's symptoms had improved.  However, she continued to have a slight wheeze, treatments being compliant with her Advair and Spiriva at home along with her albuterol p.r.n.  The patient also complains of right ear pain and bleeding which has been going on for awhile.  She denies any hearing changes.  She also denies vomiting, weight loss, or lower extremity edema.  PAST MEDICAL HISTORY: 1. COPD. 2. Ongoing tobacco abuse. 3. History of narcotic abuse. 4. History of ARDS, necrotizing pneumonia requiring  intubation in     January 2011. 5. Chronic back pain.  MEDICATIONS: 1. Advair 250/50 mg inhale one puff b.i.d. 2. Spiriva 18 mcg inhaled daily. 3. Albuterol MDI q.4-6 h. p.r.n.  ALLERGIES:  The patient reports allergies to PENICILLIN and MORPHINE.  FAMILY HISTORY:  Significant for COPD, diabetes, hypertension, uterine cancer, and lung cancer.  SOCIAL HISTORY:  The patient lives at home in [place] with her husband.  She is currently smoking a pack and a half a day.  She began smoking when she was 45 years old.  She denies alcohol use.  She reports remote drug use, last using marijuana a year ago.  REVIEW OF SYSTEMS:  All systems were reviewed and were negative except as above in the HPI.  PHYSICAL EXAMINATION:  VITAL SIGNS:  Temperature 98.6, blood pressure 128/66, pulse 112, respirations 22, O2 sat 98% on 7 L nasal cannula. GENERAL:  Obese white female in no acute distress.  Awake and oriented x3. HEENT:  Normocephalic, atraumatic.  Pupils equal, round, reactive to light.  Extraocular movements intact.  Moist mucous membranes.  Thrush on tongue.  Scab within the right ear canal. CARDIOVASCULAR:  Tachycardic with regular rhythm.  No murmurs, gallops, or rubs. LUNGS:  Diffuse coarse wheeze bilaterally.  No rhonchi or rales. ABDOMEN:  Obese, normal bowel sounds, soft, nontender, nondistended. EXTREMITIES:  No clubbing, cyanosis, or edema. NEURO:  Grossly intact with no focal deficit.  LABORATORY DATA:  White count 12.1, hemoglobin 12.6, platelets 385. Sodium 141, potassium 3.9, chloride 103, bicarb 31, BUN 10, creatinine 0.55, glucose 138, calcium 8.9.  ABG showed pH 7.34, pCO2 61, pO2 81 on 3 L nasal cannula.  Chest x-ray showed coarse bilateral markings similar to previous with no acute findings.  ASSESSMENT AND PLAN:  A 45 year old white female with chronic obstructive pulmonary disease, ongoing tobacco abuse, and history of acute respiratory distress syndrome, now with  chronic obstructive pulmonary disease exacerbation. 1. Admit to  pulmonary critical care medicine for service. 2. Chronic obstructive pulmonary disease exacerbation.  We will     schedule DuoNebs q.4 h. for the first 24 hours and change to     Combivent 2 puffs q.6 h.  Continue on home Advair 250/50 b.i.d.  We     will also continue Solu-Medrol 125 mg IV q.6 h. and can transition     this to prednisone once the patient is more stable.  We will     attempt to obtain sputum culture, start Avelox 400 mg p.o. daily,     wean oxygen to maintain saturations 88-92%, hold Spiriva for now,     and can add back at discharge.  We will monitor mental status and     repeat ABG change, currently has mild acute-on-chronic respiratory     acidosis. 3. Hemoptysis.  Currently, the patient reports this is scant.  We will     continue to monitor, consider CT bronchoscopy after acute issues,     resolved. 4. Right ear pain.  Lesion was noted at the posterior ear canal.  We     will initiate Polysporin otic drop 2 drops right ear t.i.d. 5. Thrush.  Counseled the patient on proper mouth-rinsing after Advair     use, start nystatin swish and swallow. 6. Chronic back pain, Tylenol p.r.n. 7. Tobacco abuse.  Counseled the patient on cessation, nicotine patch     21 mcg daily. 8. Fluids, electrolytes, nutrition.  Half diet as tolerated.  Saline     long, IV fluids, monitor electrolytes. 9. Prophylaxis, heparin subcu q.8 h.          ______________________________ Jenetta Loges, MD     SM/MEDQ  D:  12/01/2010  T:  12/01/2010  Job:  [id number]  Electronically Signed by Jenetta Loges MD on 02/07/2011 06:52:19 AM

## 2011-02-08 LAB — COMPREHENSIVE METABOLIC PANEL
ALT: 14 U/L (ref 0–35)
ALT: 18 U/L (ref 0–35)
ALT: 11 U/L (ref 0–35)
ALT: 16 U/L (ref 0–35)
ALT: 16 U/L (ref 0–35)
ALT: 16 U/L (ref 0–35)
AST: 14 U/L (ref 0–37)
AST: 19 U/L (ref 0–37)
AST: 17 U/L (ref 0–37)
AST: 19 U/L (ref 0–37)
AST: 20 U/L (ref 0–37)
AST: 21 U/L (ref 0–37)
AST: 23 U/L (ref 0–37)
Albumin: 1.4 g/dL — CL (ref 3.5–5.2)
Albumin: 1.6 g/dL — ABNORMAL LOW (ref 3.5–5.2)
Albumin: 1.6 g/dL — ABNORMAL LOW (ref 3.5–5.2)
Albumin: 1.5 g/dL — ABNORMAL LOW (ref 3.5–5.2)
Albumin: 1.5 g/dL — ABNORMAL LOW (ref 3.5–5.2)
Albumin: 2 g/dL — ABNORMAL LOW (ref 3.5–5.2)
Albumin: 2.1 g/dL — ABNORMAL LOW (ref 3.5–5.2)
Albumin: 2.4 g/dL — ABNORMAL LOW (ref 3.5–5.2)
Alkaline Phosphatase: 121 U/L — ABNORMAL HIGH (ref 39–117)
Alkaline Phosphatase: 73 U/L (ref 39–117)
Alkaline Phosphatase: 93 U/L (ref 39–117)
Alkaline Phosphatase: 47 U/L (ref 39–117)
Alkaline Phosphatase: 47 U/L (ref 39–117)
Alkaline Phosphatase: 49 U/L (ref 39–117)
Alkaline Phosphatase: 51 U/L (ref 39–117)
BUN: 4 mg/dL — ABNORMAL LOW (ref 6–23)
BUN: 6 mg/dL (ref 6–23)
BUN: 7 mg/dL (ref 6–23)
BUN: 5 mg/dL — ABNORMAL LOW (ref 6–23)
BUN: 5 mg/dL — ABNORMAL LOW (ref 6–23)
BUN: 6 mg/dL (ref 6–23)
BUN: 8 mg/dL (ref 6–23)
CO2: 45 mEq/L (ref 19–32)
CO2: 38 mEq/L — ABNORMAL HIGH (ref 19–32)
CO2: 40 mEq/L — ABNORMAL HIGH (ref 19–32)
CO2: 40 mEq/L — ABNORMAL HIGH (ref 19–32)
CO2: 43 mEq/L (ref 19–32)
Calcium: 8.2 mg/dL — ABNORMAL LOW (ref 8.4–10.5)
Calcium: 8.2 mg/dL — ABNORMAL LOW (ref 8.4–10.5)
Calcium: 8.4 mg/dL (ref 8.4–10.5)
Chloride: 105 mEq/L (ref 96–112)
Chloride: 91 mEq/L — ABNORMAL LOW (ref 96–112)
Chloride: 83 mEq/L — ABNORMAL LOW (ref 96–112)
Chloride: 89 mEq/L — ABNORMAL LOW (ref 96–112)
Chloride: 90 mEq/L — ABNORMAL LOW (ref 96–112)
Chloride: 90 mEq/L — ABNORMAL LOW (ref 96–112)
Chloride: 93 mEq/L — ABNORMAL LOW (ref 96–112)
Chloride: 93 mEq/L — ABNORMAL LOW (ref 96–112)
Chloride: 94 mEq/L — ABNORMAL LOW (ref 96–112)
Creatinine, Ser: 0.3 mg/dL — ABNORMAL LOW (ref 0.4–1.2)
Creatinine, Ser: 0.42 mg/dL (ref 0.4–1.2)
Creatinine, Ser: 0.31 mg/dL — ABNORMAL LOW (ref 0.4–1.2)
Creatinine, Ser: 0.38 mg/dL — ABNORMAL LOW (ref 0.4–1.2)
Creatinine, Ser: 0.39 mg/dL — ABNORMAL LOW (ref 0.4–1.2)
GFR calc Af Amer: >60 mL/min (ref >60)
GFR calc Af Amer: >60 mL/min (ref >60)
GFR calc Af Amer: >60 mL/min (ref >60)
GFR calc Af Amer: >60 mL/min (ref >60)
GFR calc Af Amer: >60 mL/min (ref >60)
GFR calc non Af Amer: >60 mL/min (ref >60)
GFR calc non Af Amer: >60 mL/min (ref >60)
GFR calc non Af Amer: >60 mL/min (ref >60)
GFR calc non Af Amer: >60 mL/min (ref >60)
Glucose, Bld: 135 mg/dL — ABNORMAL HIGH (ref 70–99)
Glucose, Bld: 134 mg/dL — ABNORMAL HIGH (ref 70–99)
Glucose, Bld: 167 mg/dL — ABNORMAL HIGH (ref 70–99)
Glucose, Bld: 350 mg/dL — ABNORMAL HIGH (ref 70–99)
Potassium: 3 mEq/L — ABNORMAL LOW (ref 3.5–5.1)
Potassium: 3.2 mEq/L — ABNORMAL LOW (ref 3.5–5.1)
Potassium: 3.3 mEq/L — ABNORMAL LOW (ref 3.5–5.1)
Potassium: 3.9 mEq/L (ref 3.5–5.1)
Potassium: 3.9 mEq/L (ref 3.5–5.1)
Potassium: 4.1 mEq/L (ref 3.5–5.1)
Potassium: 4.4 mEq/L (ref 3.5–5.1)
Sodium: 137 mEq/L (ref 135–145)
Sodium: 140 mEq/L (ref 135–145)
Sodium: 138 mEq/L (ref 135–145)
Sodium: 140 mEq/L (ref 135–145)
Total Bilirubin: 0.3 mg/dL (ref 0.3–1.2)
Total Bilirubin: 0.6 mg/dL (ref 0.3–1.2)
Total Bilirubin: 1 mg/dL (ref 0.3–1.2)
Total Bilirubin: 0.2 mg/dL — ABNORMAL LOW (ref 0.3–1.2)
Total Bilirubin: 0.2 mg/dL — ABNORMAL LOW (ref 0.3–1.2)
Total Bilirubin: 0.3 mg/dL (ref 0.3–1.2)
Total Bilirubin: 0.3 mg/dL (ref 0.3–1.2)
Total Bilirubin: 0.4 mg/dL (ref 0.3–1.2)
Total Bilirubin: 0.6 mg/dL (ref 0.3–1.2)
Total Protein: 4.8 g/dL — ABNORMAL LOW (ref 6.0–8.3)
Total Protein: 5.4 g/dL — ABNORMAL LOW (ref 6.0–8.3)
Total Protein: 6.3 g/dL (ref 6.0–8.3)
Total Protein: 7 g/dL (ref 6.0–8.3)

## 2011-02-08 LAB — GLUCOSE, CAPILLARY
Glucose-Capillary: 101 mg/dL — ABNORMAL HIGH (ref 70–99)
Glucose-Capillary: 101 mg/dL — ABNORMAL HIGH (ref 70–99)
Glucose-Capillary: 102 mg/dL — ABNORMAL HIGH (ref 70–99)
Glucose-Capillary: 103 mg/dL — ABNORMAL HIGH (ref 70–99)
Glucose-Capillary: 105 mg/dL — ABNORMAL HIGH (ref 70–99)
Glucose-Capillary: 105 mg/dL — ABNORMAL HIGH (ref 70–99)
Glucose-Capillary: 106 mg/dL — ABNORMAL HIGH (ref 70–99)
Glucose-Capillary: 107 mg/dL — ABNORMAL HIGH (ref 70–99)
Glucose-Capillary: 108 mg/dL — ABNORMAL HIGH (ref 70–99)
Glucose-Capillary: 109 mg/dL — ABNORMAL HIGH (ref 70–99)
Glucose-Capillary: 110 mg/dL — ABNORMAL HIGH (ref 70–99)
Glucose-Capillary: 110 mg/dL — ABNORMAL HIGH (ref 70–99)
Glucose-Capillary: 111 mg/dL — ABNORMAL HIGH (ref 70–99)
Glucose-Capillary: 114 mg/dL — ABNORMAL HIGH (ref 70–99)
Glucose-Capillary: 114 mg/dL — ABNORMAL HIGH (ref 70–99)
Glucose-Capillary: 114 mg/dL — ABNORMAL HIGH (ref 70–99)
Glucose-Capillary: 119 mg/dL — ABNORMAL HIGH (ref 70–99)
Glucose-Capillary: 119 mg/dL — ABNORMAL HIGH (ref 70–99)
Glucose-Capillary: 119 mg/dL — ABNORMAL HIGH (ref 70–99)
Glucose-Capillary: 119 mg/dL — ABNORMAL HIGH (ref 70–99)
Glucose-Capillary: 120 mg/dL — ABNORMAL HIGH (ref 70–99)
Glucose-Capillary: 122 mg/dL — ABNORMAL HIGH (ref 70–99)
Glucose-Capillary: 122 mg/dL — ABNORMAL HIGH (ref 70–99)
Glucose-Capillary: 126 mg/dL — ABNORMAL HIGH (ref 70–99)
Glucose-Capillary: 126 mg/dL — ABNORMAL HIGH (ref 70–99)
Glucose-Capillary: 127 mg/dL — ABNORMAL HIGH (ref 70–99)
Glucose-Capillary: 132 mg/dL — ABNORMAL HIGH (ref 70–99)
Glucose-Capillary: 133 mg/dL — ABNORMAL HIGH (ref 70–99)
Glucose-Capillary: 133 mg/dL — ABNORMAL HIGH (ref 70–99)
Glucose-Capillary: 136 mg/dL — ABNORMAL HIGH (ref 70–99)
Glucose-Capillary: 136 mg/dL — ABNORMAL HIGH (ref 70–99)
Glucose-Capillary: 143 mg/dL — ABNORMAL HIGH (ref 70–99)
Glucose-Capillary: 144 mg/dL — ABNORMAL HIGH (ref 70–99)
Glucose-Capillary: 147 mg/dL — ABNORMAL HIGH (ref 70–99)
Glucose-Capillary: 150 mg/dL — ABNORMAL HIGH (ref 70–99)
Glucose-Capillary: 151 mg/dL — ABNORMAL HIGH (ref 70–99)
Glucose-Capillary: 153 mg/dL — ABNORMAL HIGH (ref 70–99)
Glucose-Capillary: 155 mg/dL — ABNORMAL HIGH (ref 70–99)
Glucose-Capillary: 166 mg/dL — ABNORMAL HIGH (ref 70–99)
Glucose-Capillary: 168 mg/dL — ABNORMAL HIGH (ref 70–99)
Glucose-Capillary: 169 mg/dL — ABNORMAL HIGH (ref 70–99)
Glucose-Capillary: 179 mg/dL — ABNORMAL HIGH (ref 70–99)
Glucose-Capillary: 182 mg/dL — ABNORMAL HIGH (ref 70–99)
Glucose-Capillary: 184 mg/dL — ABNORMAL HIGH (ref 70–99)
Glucose-Capillary: 206 mg/dL — ABNORMAL HIGH (ref 70–99)
Glucose-Capillary: 73 mg/dL (ref 70–99)
Glucose-Capillary: 84 mg/dL (ref 70–99)
Glucose-Capillary: 87 mg/dL (ref 70–99)
Glucose-Capillary: 87 mg/dL (ref 70–99)
Glucose-Capillary: 88 mg/dL (ref 70–99)
Glucose-Capillary: 93 mg/dL (ref 70–99)
Glucose-Capillary: 95 mg/dL (ref 70–99)
Glucose-Capillary: 96 mg/dL (ref 70–99)
Glucose-Capillary: 99 mg/dL (ref 70–99)
Glucose-Capillary: 100 mg/dL — ABNORMAL HIGH (ref 70–99)
Glucose-Capillary: 100 mg/dL — ABNORMAL HIGH (ref 70–99)
Glucose-Capillary: 102 mg/dL — ABNORMAL HIGH (ref 70–99)
Glucose-Capillary: 103 mg/dL — ABNORMAL HIGH (ref 70–99)
Glucose-Capillary: 103 mg/dL — ABNORMAL HIGH (ref 70–99)
Glucose-Capillary: 106 mg/dL — ABNORMAL HIGH (ref 70–99)
Glucose-Capillary: 106 mg/dL — ABNORMAL HIGH (ref 70–99)
Glucose-Capillary: 106 mg/dL — ABNORMAL HIGH (ref 70–99)
Glucose-Capillary: 108 mg/dL — ABNORMAL HIGH (ref 70–99)
Glucose-Capillary: 108 mg/dL — ABNORMAL HIGH (ref 70–99)
Glucose-Capillary: 109 mg/dL — ABNORMAL HIGH (ref 70–99)
Glucose-Capillary: 109 mg/dL — ABNORMAL HIGH (ref 70–99)
Glucose-Capillary: 110 mg/dL — ABNORMAL HIGH (ref 70–99)
Glucose-Capillary: 111 mg/dL — ABNORMAL HIGH (ref 70–99)
Glucose-Capillary: 112 mg/dL — ABNORMAL HIGH (ref 70–99)
Glucose-Capillary: 112 mg/dL — ABNORMAL HIGH (ref 70–99)
Glucose-Capillary: 113 mg/dL — ABNORMAL HIGH (ref 70–99)
Glucose-Capillary: 114 mg/dL — ABNORMAL HIGH (ref 70–99)
Glucose-Capillary: 114 mg/dL — ABNORMAL HIGH (ref 70–99)
Glucose-Capillary: 114 mg/dL — ABNORMAL HIGH (ref 70–99)
Glucose-Capillary: 114 mg/dL — ABNORMAL HIGH (ref 70–99)
Glucose-Capillary: 114 mg/dL — ABNORMAL HIGH (ref 70–99)
Glucose-Capillary: 115 mg/dL — ABNORMAL HIGH (ref 70–99)
Glucose-Capillary: 115 mg/dL — ABNORMAL HIGH (ref 70–99)
Glucose-Capillary: 115 mg/dL — ABNORMAL HIGH (ref 70–99)
Glucose-Capillary: 116 mg/dL — ABNORMAL HIGH (ref 70–99)
Glucose-Capillary: 116 mg/dL — ABNORMAL HIGH (ref 70–99)
Glucose-Capillary: 117 mg/dL — ABNORMAL HIGH (ref 70–99)
Glucose-Capillary: 117 mg/dL — ABNORMAL HIGH (ref 70–99)
Glucose-Capillary: 117 mg/dL — ABNORMAL HIGH (ref 70–99)
Glucose-Capillary: 118 mg/dL — ABNORMAL HIGH (ref 70–99)
Glucose-Capillary: 118 mg/dL — ABNORMAL HIGH (ref 70–99)
Glucose-Capillary: 119 mg/dL — ABNORMAL HIGH (ref 70–99)
Glucose-Capillary: 119 mg/dL — ABNORMAL HIGH (ref 70–99)
Glucose-Capillary: 120 mg/dL — ABNORMAL HIGH (ref 70–99)
Glucose-Capillary: 121 mg/dL — ABNORMAL HIGH (ref 70–99)
Glucose-Capillary: 122 mg/dL — ABNORMAL HIGH (ref 70–99)
Glucose-Capillary: 124 mg/dL — ABNORMAL HIGH (ref 70–99)
Glucose-Capillary: 124 mg/dL — ABNORMAL HIGH (ref 70–99)
Glucose-Capillary: 125 mg/dL — ABNORMAL HIGH (ref 70–99)
Glucose-Capillary: 125 mg/dL — ABNORMAL HIGH (ref 70–99)
Glucose-Capillary: 125 mg/dL — ABNORMAL HIGH (ref 70–99)
Glucose-Capillary: 126 mg/dL — ABNORMAL HIGH (ref 70–99)
Glucose-Capillary: 126 mg/dL — ABNORMAL HIGH (ref 70–99)
Glucose-Capillary: 127 mg/dL — ABNORMAL HIGH (ref 70–99)
Glucose-Capillary: 128 mg/dL — ABNORMAL HIGH (ref 70–99)
Glucose-Capillary: 128 mg/dL — ABNORMAL HIGH (ref 70–99)
Glucose-Capillary: 128 mg/dL — ABNORMAL HIGH (ref 70–99)
Glucose-Capillary: 129 mg/dL — ABNORMAL HIGH (ref 70–99)
Glucose-Capillary: 129 mg/dL — ABNORMAL HIGH (ref 70–99)
Glucose-Capillary: 129 mg/dL — ABNORMAL HIGH (ref 70–99)
Glucose-Capillary: 130 mg/dL — ABNORMAL HIGH (ref 70–99)
Glucose-Capillary: 131 mg/dL — ABNORMAL HIGH (ref 70–99)
Glucose-Capillary: 131 mg/dL — ABNORMAL HIGH (ref 70–99)
Glucose-Capillary: 131 mg/dL — ABNORMAL HIGH (ref 70–99)
Glucose-Capillary: 132 mg/dL — ABNORMAL HIGH (ref 70–99)
Glucose-Capillary: 132 mg/dL — ABNORMAL HIGH (ref 70–99)
Glucose-Capillary: 132 mg/dL — ABNORMAL HIGH (ref 70–99)
Glucose-Capillary: 133 mg/dL — ABNORMAL HIGH (ref 70–99)
Glucose-Capillary: 134 mg/dL — ABNORMAL HIGH (ref 70–99)
Glucose-Capillary: 134 mg/dL — ABNORMAL HIGH (ref 70–99)
Glucose-Capillary: 135 mg/dL — ABNORMAL HIGH (ref 70–99)
Glucose-Capillary: 136 mg/dL — ABNORMAL HIGH (ref 70–99)
Glucose-Capillary: 137 mg/dL — ABNORMAL HIGH (ref 70–99)
Glucose-Capillary: 137 mg/dL — ABNORMAL HIGH (ref 70–99)
Glucose-Capillary: 137 mg/dL — ABNORMAL HIGH (ref 70–99)
Glucose-Capillary: 138 mg/dL — ABNORMAL HIGH (ref 70–99)
Glucose-Capillary: 138 mg/dL — ABNORMAL HIGH (ref 70–99)
Glucose-Capillary: 139 mg/dL — ABNORMAL HIGH (ref 70–99)
Glucose-Capillary: 142 mg/dL — ABNORMAL HIGH (ref 70–99)
Glucose-Capillary: 143 mg/dL — ABNORMAL HIGH (ref 70–99)
Glucose-Capillary: 151 mg/dL — ABNORMAL HIGH (ref 70–99)
Glucose-Capillary: 155 mg/dL — ABNORMAL HIGH (ref 70–99)
Glucose-Capillary: 156 mg/dL — ABNORMAL HIGH (ref 70–99)
Glucose-Capillary: 157 mg/dL — ABNORMAL HIGH (ref 70–99)
Glucose-Capillary: 160 mg/dL — ABNORMAL HIGH (ref 70–99)
Glucose-Capillary: 163 mg/dL — ABNORMAL HIGH (ref 70–99)
Glucose-Capillary: 165 mg/dL — ABNORMAL HIGH (ref 70–99)
Glucose-Capillary: 169 mg/dL — ABNORMAL HIGH (ref 70–99)
Glucose-Capillary: 172 mg/dL — ABNORMAL HIGH (ref 70–99)
Glucose-Capillary: 248 mg/dL — ABNORMAL HIGH (ref 70–99)
Glucose-Capillary: 84 mg/dL (ref 70–99)
Glucose-Capillary: 85 mg/dL (ref 70–99)
Glucose-Capillary: 86 mg/dL (ref 70–99)
Glucose-Capillary: 86 mg/dL (ref 70–99)
Glucose-Capillary: 90 mg/dL (ref 70–99)
Glucose-Capillary: 91 mg/dL (ref 70–99)
Glucose-Capillary: 93 mg/dL (ref 70–99)
Glucose-Capillary: 94 mg/dL (ref 70–99)
Glucose-Capillary: 94 mg/dL (ref 70–99)
Glucose-Capillary: 94 mg/dL (ref 70–99)
Glucose-Capillary: 94 mg/dL (ref 70–99)
Glucose-Capillary: 95 mg/dL (ref 70–99)
Glucose-Capillary: 95 mg/dL (ref 70–99)
Glucose-Capillary: 96 mg/dL (ref 70–99)
Glucose-Capillary: 98 mg/dL (ref 70–99)
Glucose-Capillary: 99 mg/dL (ref 70–99)
Glucose-Capillary: 99 mg/dL (ref 70–99)

## 2011-02-08 LAB — CLOSTRIDIUM DIFFICILE EIA
C difficile Toxins A+B, EIA: NEGATIVE
C difficile Toxins A+B, EIA: NEGATIVE
C difficile Toxins A+B, EIA: NEGATIVE

## 2011-02-08 LAB — BLOOD GAS, ARTERIAL
Acid-Base Excess: 1.5 mmol/L (ref 0.0–2.0)
Acid-Base Excess: 13.9 mmol/L — ABNORMAL HIGH (ref 0.0–2.0)
Acid-Base Excess: 18.6 mmol/L — ABNORMAL HIGH (ref 0.0–2.0)
Acid-Base Excess: 19.3 mmol/L — ABNORMAL HIGH (ref 0.0–2.0)
Acid-Base Excess: 19.5 mmol/L — ABNORMAL HIGH (ref 0.0–2.0)
Acid-Base Excess: 23.2 mmol/L — ABNORMAL HIGH (ref 0.0–2.0)
Acid-Base Excess: 24.3 mmol/L — ABNORMAL HIGH (ref 0.0–2.0)
Acid-Base Excess: 6.7 mmol/L — ABNORMAL HIGH (ref 0.0–2.0)
Acid-Base Excess: 11.3 mmol/L — ABNORMAL HIGH (ref 0.0–2.0)
Acid-Base Excess: 11.5 mmol/L — ABNORMAL HIGH (ref 0.0–2.0)
Acid-Base Excess: 14.3 mmol/L — ABNORMAL HIGH (ref 0.0–2.0)
Acid-Base Excess: 16.8 mmol/L — ABNORMAL HIGH (ref 0.0–2.0)
Acid-Base Excess: 16.8 mmol/L — ABNORMAL HIGH (ref 0.0–2.0)
Acid-Base Excess: 18.1 mmol/L — ABNORMAL HIGH (ref 0.0–2.0)
Acid-Base Excess: 20 mmol/L — ABNORMAL HIGH (ref 0.0–2.0)
Acid-Base Excess: 21.5 mmol/L — ABNORMAL HIGH (ref 0.0–2.0)
Bicarbonate: 28.6 mEq/L — ABNORMAL HIGH (ref 20.0–24.0)
Bicarbonate: 33.4 mEq/L — ABNORMAL HIGH (ref 20.0–24.0)
Bicarbonate: 37 mEq/L — ABNORMAL HIGH (ref 20.0–24.0)
Bicarbonate: 42.2 mEq/L — ABNORMAL HIGH (ref 20.0–24.0)
Bicarbonate: 44.3 meq/L — ABNORMAL HIGH (ref 20.0–24.0)
Bicarbonate: 46.2 mEq/L — ABNORMAL HIGH (ref 20.0–24.0)
Bicarbonate: 49.1 mEq/L — ABNORMAL HIGH (ref 20.0–24.0)
Bicarbonate: 53.6 mEq/L — ABNORMAL HIGH (ref 20.0–24.0)
Bicarbonate: 41.1 mEq/L — ABNORMAL HIGH (ref 20.0–24.0)
Bicarbonate: 43.7 mEq/L — ABNORMAL HIGH (ref 20.0–24.0)
Bicarbonate: 44.1 mEq/L — ABNORMAL HIGH (ref 20.0–24.0)
Bicarbonate: 44.7 mEq/L — ABNORMAL HIGH (ref 20.0–24.0)
Bicarbonate: 45 mEq/L — ABNORMAL HIGH (ref 20.0–24.0)
Bicarbonate: 45.7 mEq/L — ABNORMAL HIGH (ref 20.0–24.0)
Bicarbonate: 46.4 mEq/L — ABNORMAL HIGH (ref 20.0–24.0)
Bicarbonate: 48.3 mEq/L — ABNORMAL HIGH (ref 20.0–24.0)
Bicarbonate: 50.4 mEq/L — ABNORMAL HIGH (ref 20.0–24.0)
Drawn by: 129801
Drawn by: 129801
Drawn by: 235321
Drawn by: 257701
Drawn by: 295031
Drawn by: 295031
Drawn by: 295031
Drawn by: 308601
Drawn by: 308601
Drawn by: 308601
Drawn by: 145321
Drawn by: 290241
Drawn by: 295031
FIO2: 0.5 %
FIO2: 0.5 %
FIO2: 0.6 %
FIO2: 0.6 %
FIO2: 0.7 %
FIO2: 0.7 %
FIO2: 0.5 %
FIO2: 0.5 %
FIO2: 0.5 %
FIO2: 0.5 %
FIO2: 0.6 %
FIO2: 0.6 %
FIO2: 0.6 %
FIO2: 0.8 %
MECHVT: 0.36 mL
MECHVT: 280 mL
MECHVT: 280 mL
MECHVT: 360 mL
MECHVT: 0.35 mL
MECHVT: 350 mL
MECHVT: 350 mL
MECHVT: 350 mL
O2 Content: 6 L/min
O2 Saturation: 91.5 %
O2 Saturation: 93.7 %
O2 Saturation: 94.4 %
O2 Saturation: 95.2 %
O2 Saturation: 95.5 %
O2 Saturation: 95.6 %
O2 Saturation: 97 %
O2 Saturation: 99 %
O2 Saturation: 90.1 %
O2 Saturation: 91.1 %
O2 Saturation: 91.5 %
O2 Saturation: 92 %
O2 Saturation: 92.5 %
O2 Saturation: 92.5 %
O2 Saturation: 92.9 %
O2 Saturation: 98.2 %
PEEP: 5 cmH2O
PEEP: 5 cmH2O
PEEP: 8 cmH2O
PEEP: 8 cmH2O
PEEP: 8 cmH2O
PEEP: 8 cmH2O
PEEP: 8 cmH2O
PEEP: 8 cmH2O
PEEP: 8 cmH2O
PEEP: 8 cmH2O
PEEP: 8 cmH2O
PEEP: 8 cmH2O
PEEP: 8 cmH2O
PEEP: 8 cmH2O
PEEP: 8 cmH2O
PEEP: 8 cmH2O
Patient temperature: 101
Patient temperature: 97.6
Patient temperature: 98.6
Patient temperature: 98.6
Patient temperature: 98.6
Patient temperature: 98.6
Patient temperature: 99.2
Patient temperature: 99.6
Patient temperature: 99.7
Patient temperature: 99.8
Patient temperature: 98.6
Patient temperature: 98.6
Patient temperature: 99
Patient temperature: 99.4
Patient temperature: 99.4
Pressure control: 25 cmH2O
Pressure control: 26 cmH2O
Pressure control: 26 cmH2O
Pressure control: 30 cmH2O
Pressure control: 30 cmH2O
RATE: 18 resp/min
RATE: 22 resp/min
RATE: 22 resp/min
RATE: 22 resp/min
RATE: 22 resp/min
RATE: 24 resp/min
RATE: 26 resp/min
RATE: 26 resp/min
RATE: 30 resp/min
RATE: 35 resp/min
RATE: 35 resp/min
RATE: 35 resp/min
RATE: 35 resp/min
RATE: 26 resp/min
RATE: 26 resp/min
RATE: 26 resp/min
RATE: 26 resp/min
TCO2: 26.3 mmol/L (ref 0–100)
TCO2: 30.9 mmol/L (ref 0–100)
TCO2: 35.9 mmol/L (ref 0–100)
TCO2: 42 mmol/L (ref 0–100)
TCO2: 43.2 mmol/L (ref 0–100)
TCO2: 44.7 mmol/L (ref 0–100)
TCO2: 46 mmol/L (ref 0–100)
TCO2: 47.1 mmol/L (ref 0–100)
TCO2: 48 mmol/L (ref 0–100)
TCO2: 52 mmol/L (ref 0–100)
TCO2: 34.3 mmol/L (ref 0–100)
TCO2: 35.8 mmol/L (ref 0–100)
TCO2: 38.1 mmol/L (ref 0–100)
TCO2: 42.1 mmol/L (ref 0–100)
TCO2: 42.2 mmol/L (ref 0–100)
TCO2: 42.9 mmol/L (ref 0–100)
TCO2: 43 mmol/L (ref 0–100)
TCO2: 43.1 mmol/L (ref 0–100)
TCO2: 45.7 mmol/L (ref 0–100)
TCO2: 48.3 mmol/L (ref 0–100)
pCO2 arterial: 100 mmHg (ref 35.0–45.0)
pCO2 arterial: 102 mmHg (ref 35.0–45.0)
pCO2 arterial: 144 mmHg (ref 35.0–45.0)
pCO2 arterial: 64.4 mmHg (ref 35.0–45.0)
pCO2 arterial: 69.3 mmHg (ref 35.0–45.0)
pCO2 arterial: 72.3 mmHg (ref 35.0–45.0)
pCO2 arterial: 74.9 mmHg (ref 35.0–45.0)
pCO2 arterial: 80.6 mmHg (ref 35.0–45.0)
pCO2 arterial: 88.2 mmHg (ref 35.0–45.0)
pCO2 arterial: 96.8 mmHg (ref 35.0–45.0)
pCO2 arterial: 97.7 mmHg (ref 35.0–45.0)
pCO2 arterial: 66.8 mmHg (ref 35.0–45.0)
pCO2 arterial: 85 mmHg (ref 35.0–45.0)
pCO2 arterial: 88.1 mmHg (ref 35.0–45.0)
pH, Arterial: 7.129 — CL (ref 7.350–7.400)
pH, Arterial: 7.201 — ABNORMAL LOW (ref 7.350–7.400)
pH, Arterial: 7.253 — ABNORMAL LOW (ref 7.350–7.400)
pH, Arterial: 7.263 — ABNORMAL LOW (ref 7.350–7.400)
pH, Arterial: 7.29 — ABNORMAL LOW (ref 7.350–7.400)
pH, Arterial: 7.301 — ABNORMAL LOW (ref 7.350–7.400)
pH, Arterial: 7.304 — ABNORMAL LOW (ref 7.350–7.400)
pH, Arterial: 7.315 — ABNORMAL LOW (ref 7.350–7.400)
pH, Arterial: 7.327 — ABNORMAL LOW (ref 7.350–7.400)
pH, Arterial: 7.364 (ref 7.350–7.400)
pH, Arterial: 7.408 — ABNORMAL HIGH (ref 7.350–7.400)
pH, Arterial: 7.329 — ABNORMAL LOW (ref 7.350–7.400)
pH, Arterial: 7.343 — ABNORMAL LOW (ref 7.350–7.400)
pH, Arterial: 7.357 (ref 7.350–7.400)
pH, Arterial: 7.375 (ref 7.350–7.400)
pO2, Arterial: 121 mmHg — ABNORMAL HIGH (ref 80.0–100.0)
pO2, Arterial: 133 mmHg — ABNORMAL HIGH (ref 80.0–100.0)
pO2, Arterial: 64.8 mmHg — ABNORMAL LOW (ref 80.0–100.0)
pO2, Arterial: 64.8 mmHg — ABNORMAL LOW (ref 80.0–100.0)
pO2, Arterial: 67.7 mmHg — ABNORMAL LOW (ref 80.0–100.0)
pO2, Arterial: 75.1 mmHg — ABNORMAL LOW (ref 80.0–100.0)
pO2, Arterial: 95.3 mmHg (ref 80.0–100.0)
pO2, Arterial: 112 mmHg — ABNORMAL HIGH (ref 80.0–100.0)
pO2, Arterial: 64.9 mmHg — ABNORMAL LOW (ref 80.0–100.0)
pO2, Arterial: 65.8 mmHg — ABNORMAL LOW (ref 80.0–100.0)
pO2, Arterial: 69.8 mmHg — ABNORMAL LOW (ref 80.0–100.0)
pO2, Arterial: 72.8 mmHg — ABNORMAL LOW (ref 80.0–100.0)
pO2, Arterial: 80.6 mmHg (ref 80.0–100.0)
pO2, Arterial: 90.1 mmHg (ref 80.0–100.0)
pO2, Arterial: 99 mmHg (ref 80.0–100.0)

## 2011-02-08 LAB — BASIC METABOLIC PANEL
BUN: 10 mg/dL (ref 6–23)
BUN: 3 mg/dL — ABNORMAL LOW (ref 6–23)
BUN: 4 mg/dL — ABNORMAL LOW (ref 6–23)
BUN: 9 mg/dL (ref 6–23)
BUN: 4 mg/dL — ABNORMAL LOW (ref 6–23)
BUN: 6 mg/dL (ref 6–23)
CO2: 44 mEq/L (ref 19–32)
CO2: 45 mEq/L (ref 19–32)
CO2: >45 mEq/L (ref 19–32)
CO2: 36 mEq/L — ABNORMAL HIGH (ref 19–32)
CO2: 38 mEq/L — ABNORMAL HIGH (ref 19–32)
CO2: 42 mEq/L (ref 19–32)
CO2: 42 mEq/L (ref 19–32)
CO2: 44 mEq/L (ref 19–32)
CO2: 45 mEq/L (ref 19–32)
Calcium: 7.7 mg/dL — ABNORMAL LOW (ref 8.4–10.5)
Calcium: 7.8 mg/dL — ABNORMAL LOW (ref 8.4–10.5)
Calcium: 7.8 mg/dL — ABNORMAL LOW (ref 8.4–10.5)
Calcium: 7.9 mg/dL — ABNORMAL LOW (ref 8.4–10.5)
Calcium: 8 mg/dL — ABNORMAL LOW (ref 8.4–10.5)
Calcium: 8.1 mg/dL — ABNORMAL LOW (ref 8.4–10.5)
Calcium: 8 mg/dL — ABNORMAL LOW (ref 8.4–10.5)
Calcium: 8.1 mg/dL — ABNORMAL LOW (ref 8.4–10.5)
Calcium: 8.8 mg/dL (ref 8.4–10.5)
Calcium: 9 mg/dL (ref 8.4–10.5)
Calcium: 9.3 mg/dL (ref 8.4–10.5)
Chloride: 103 mEq/L (ref 96–112)
Chloride: 88 mEq/L — ABNORMAL LOW (ref 96–112)
Chloride: 89 mEq/L — ABNORMAL LOW (ref 96–112)
Chloride: 91 mEq/L — ABNORMAL LOW (ref 96–112)
Chloride: 95 mEq/L — ABNORMAL LOW (ref 96–112)
Chloride: 88 mEq/L — ABNORMAL LOW (ref 96–112)
Chloride: 90 mEq/L — ABNORMAL LOW (ref 96–112)
Chloride: 93 mEq/L — ABNORMAL LOW (ref 96–112)
Chloride: 94 mEq/L — ABNORMAL LOW (ref 96–112)
Chloride: 94 mEq/L — ABNORMAL LOW (ref 96–112)
Chloride: 94 mEq/L — ABNORMAL LOW (ref 96–112)
Creatinine, Ser: 0.3 mg/dL — ABNORMAL LOW (ref 0.4–1.2)
Creatinine, Ser: 0.32 mg/dL — ABNORMAL LOW (ref 0.4–1.2)
Creatinine, Ser: 0.32 mg/dL — ABNORMAL LOW (ref 0.4–1.2)
Creatinine, Ser: 0.39 mg/dL — ABNORMAL LOW (ref 0.4–1.2)
Creatinine, Ser: 0.44 mg/dL (ref 0.4–1.2)
Creatinine, Ser: 0.62 mg/dL (ref 0.4–1.2)
Creatinine, Ser: 0.31 mg/dL — ABNORMAL LOW (ref 0.4–1.2)
Creatinine, Ser: 0.31 mg/dL — ABNORMAL LOW (ref 0.4–1.2)
Creatinine, Ser: 0.32 mg/dL — ABNORMAL LOW (ref 0.4–1.2)
GFR calc Af Amer: >60 mL/min (ref >60)
GFR calc Af Amer: >60 mL/min (ref >60)
GFR calc Af Amer: >60 mL/min (ref >60)
GFR calc Af Amer: >60 mL/min (ref >60)
GFR calc Af Amer: >60 mL/min (ref >60)
GFR calc Af Amer: >60 mL/min (ref >60)
GFR calc Af Amer: >60 mL/min (ref >60)
GFR calc non Af Amer: >60 mL/min (ref >60)
GFR calc non Af Amer: >60 mL/min (ref >60)
GFR calc non Af Amer: >60 mL/min (ref >60)
GFR calc non Af Amer: >60 mL/min (ref >60)
GFR calc non Af Amer: >60 mL/min (ref >60)
GFR calc non Af Amer: >60 mL/min (ref >60)
GFR calc non Af Amer: >60 mL/min (ref >60)
Glucose, Bld: 133 mg/dL — ABNORMAL HIGH (ref 70–99)
Glucose, Bld: 169 mg/dL — ABNORMAL HIGH (ref 70–99)
Glucose, Bld: 192 mg/dL — ABNORMAL HIGH (ref 70–99)
Glucose, Bld: 246 mg/dL — ABNORMAL HIGH (ref 70–99)
Glucose, Bld: 73 mg/dL (ref 70–99)
Glucose, Bld: 107 mg/dL — ABNORMAL HIGH (ref 70–99)
Glucose, Bld: 135 mg/dL — ABNORMAL HIGH (ref 70–99)
Glucose, Bld: 151 mg/dL — ABNORMAL HIGH (ref 70–99)
Glucose, Bld: 154 mg/dL — ABNORMAL HIGH (ref 70–99)
Glucose, Bld: 93 mg/dL (ref 70–99)
Glucose, Bld: 95 mg/dL (ref 70–99)
Potassium: 3.9 mEq/L (ref 3.5–5.1)
Potassium: 4 mEq/L (ref 3.5–5.1)
Potassium: 4.2 mEq/L (ref 3.5–5.1)
Potassium: 3.6 mEq/L (ref 3.5–5.1)
Potassium: 3.7 mEq/L (ref 3.5–5.1)
Potassium: 3.7 mEq/L (ref 3.5–5.1)
Potassium: 4 mEq/L (ref 3.5–5.1)
Potassium: 4.1 mEq/L (ref 3.5–5.1)
Potassium: 4.4 mEq/L (ref 3.5–5.1)
Sodium: 135 mEq/L (ref 135–145)
Sodium: 137 mEq/L (ref 135–145)
Sodium: 137 mEq/L (ref 135–145)
Sodium: 140 mEq/L (ref 135–145)
Sodium: 136 mEq/L (ref 135–145)
Sodium: 137 mEq/L (ref 135–145)
Sodium: 137 mEq/L (ref 135–145)
Sodium: 140 mEq/L (ref 135–145)
Sodium: 141 mEq/L (ref 135–145)

## 2011-02-08 LAB — CBC
HCT: 27.1 % — ABNORMAL LOW (ref 36.0–46.0)
HCT: 30.5 % — ABNORMAL LOW (ref 36.0–46.0)
HCT: 32 % — ABNORMAL LOW (ref 36.0–46.0)
HCT: 34.9 % — ABNORMAL LOW (ref 36.0–46.0)
HCT: 35.3 % — ABNORMAL LOW (ref 36.0–46.0)
HCT: 35.6 % — ABNORMAL LOW (ref 36.0–46.0)
HCT: 40.8 % (ref 36.0–46.0)
HCT: 21.6 % — ABNORMAL LOW (ref 36.0–46.0)
HCT: 23.4 % — ABNORMAL LOW (ref 36.0–46.0)
HCT: 24.4 % — ABNORMAL LOW (ref 36.0–46.0)
HCT: 24.5 % — ABNORMAL LOW (ref 36.0–46.0)
HCT: 26.1 % — ABNORMAL LOW (ref 36.0–46.0)
HCT: 27.6 % — ABNORMAL LOW (ref 36.0–46.0)
HCT: 28.3 % — ABNORMAL LOW (ref 36.0–46.0)
HCT: 30.4 % — ABNORMAL LOW (ref 36.0–46.0)
HCT: 33.9 % — ABNORMAL LOW (ref 36.0–46.0)
HCT: 34.3 % — ABNORMAL LOW (ref 36.0–46.0)
Hemoglobin: 10.5 g/dL — ABNORMAL LOW (ref 12.0–15.0)
Hemoglobin: 13.4 g/dL (ref 12.0–15.0)
Hemoglobin: 8.2 g/dL — ABNORMAL LOW (ref 12.0–15.0)
Hemoglobin: 8.5 g/dL — ABNORMAL LOW (ref 12.0–15.0)
Hemoglobin: 9.9 g/dL — ABNORMAL LOW (ref 12.0–15.0)
Hemoglobin: 11.1 g/dL — ABNORMAL LOW (ref 12.0–15.0)
Hemoglobin: 11.6 g/dL — ABNORMAL LOW (ref 12.0–15.0)
Hemoglobin: 7.2 g/dL — ABNORMAL LOW (ref 12.0–15.0)
Hemoglobin: 7.5 g/dL — ABNORMAL LOW (ref 12.0–15.0)
Hemoglobin: 7.6 g/dL — ABNORMAL LOW (ref 12.0–15.0)
Hemoglobin: 7.7 g/dL — ABNORMAL LOW (ref 12.0–15.0)
Hemoglobin: 8.9 g/dL — ABNORMAL LOW (ref 12.0–15.0)
Hemoglobin: 8.9 g/dL — ABNORMAL LOW (ref 12.0–15.0)
MCHC: 31.3 g/dL (ref 30.0–36.0)
MCHC: 32.2 g/dL (ref 30.0–36.0)
MCHC: 32.2 g/dL (ref 30.0–36.0)
MCHC: 32.4 g/dL (ref 30.0–36.0)
MCHC: 32.9 g/dL (ref 30.0–36.0)
MCHC: 32.1 g/dL (ref 30.0–36.0)
MCHC: 32.3 g/dL (ref 30.0–36.0)
MCHC: 32.7 g/dL (ref 30.0–36.0)
MCHC: 32.8 g/dL (ref 30.0–36.0)
MCHC: 32.9 g/dL (ref 30.0–36.0)
MCHC: 34 g/dL (ref 30.0–36.0)
MCHC: 35.8 g/dL (ref 30.0–36.0)
MCV: 83.2 fL (ref 78.0–100.0)
MCV: 84.1 fL (ref 78.0–100.0)
MCV: 84.2 fL (ref 78.0–100.0)
MCV: 84.3 fL (ref 78.0–100.0)
MCV: 84.5 fL (ref 78.0–100.0)
MCV: 85.1 fL (ref 78.0–100.0)
MCV: 82.5 fL (ref 78.0–100.0)
MCV: 83.3 fL (ref 78.0–100.0)
MCV: 83.4 fL (ref 78.0–100.0)
MCV: 83.7 fL (ref 78.0–100.0)
MCV: 83.7 fL (ref 78.0–100.0)
MCV: 83.8 fL (ref 78.0–100.0)
MCV: 84 fL (ref 78.0–100.0)
MCV: 84.2 fL (ref 78.0–100.0)
MCV: 84.3 fL (ref 78.0–100.0)
Platelets: 290 10*3/uL (ref 150–400)
Platelets: 296 10*3/uL (ref 150–400)
Platelets: 301 K/uL (ref 150–400)
Platelets: 305 10*3/uL (ref 150–400)
Platelets: 329 10*3/uL (ref 150–400)
Platelets: 339 10*3/uL (ref 150–400)
Platelets: 368 10*3/uL (ref 150–400)
Platelets: 319 10*3/uL (ref 150–400)
Platelets: 321 10*3/uL (ref 150–400)
Platelets: 327 10*3/uL (ref 150–400)
Platelets: 352 10*3/uL (ref 150–400)
Platelets: 391 10*3/uL (ref 150–400)
Platelets: 415 10*3/uL — ABNORMAL HIGH (ref 150–400)
Platelets: 446 10*3/uL — ABNORMAL HIGH (ref 150–400)
Platelets: 536 10*3/uL — ABNORMAL HIGH (ref 150–400)
RBC: 2.83 MIL/uL — ABNORMAL LOW (ref 3.87–5.11)
RBC: 2.95 MIL/uL — ABNORMAL LOW (ref 3.87–5.11)
RBC: 3 MIL/uL — ABNORMAL LOW (ref 3.87–5.11)
RBC: 4.28 MIL/uL (ref 3.87–5.11)
RBC: 4.9 MIL/uL (ref 3.87–5.11)
RBC: 2.75 MIL/uL — ABNORMAL LOW (ref 3.87–5.11)
RBC: 2.82 MIL/uL — ABNORMAL LOW (ref 3.87–5.11)
RBC: 2.85 MIL/uL — ABNORMAL LOW (ref 3.87–5.11)
RBC: 3.22 MIL/uL — ABNORMAL LOW (ref 3.87–5.11)
RBC: 3.33 MIL/uL — ABNORMAL LOW (ref 3.87–5.11)
RBC: 3.76 MIL/uL — ABNORMAL LOW (ref 3.87–5.11)
RBC: 4.11 MIL/uL (ref 3.87–5.11)
RDW: 16.2 % — ABNORMAL HIGH (ref 11.5–15.5)
RDW: 16.3 % — ABNORMAL HIGH (ref 11.5–15.5)
RDW: 16.4 % — ABNORMAL HIGH (ref 11.5–15.5)
RDW: 16.5 % — ABNORMAL HIGH (ref 11.5–15.5)
RDW: 16.6 % — ABNORMAL HIGH (ref 11.5–15.5)
RDW: 16.8 % — ABNORMAL HIGH (ref 11.5–15.5)
RDW: 16.9 % — ABNORMAL HIGH (ref 11.5–15.5)
RDW: 17 % — ABNORMAL HIGH (ref 11.5–15.5)
RDW: 17 % — ABNORMAL HIGH (ref 11.5–15.5)
RDW: 17 % — ABNORMAL HIGH (ref 11.5–15.5)
RDW: 15.3 % (ref 11.5–15.5)
RDW: 16.5 % — ABNORMAL HIGH (ref 11.5–15.5)
RDW: 16.5 % — ABNORMAL HIGH (ref 11.5–15.5)
RDW: 16.7 % — ABNORMAL HIGH (ref 11.5–15.5)
RDW: 17.2 % — ABNORMAL HIGH (ref 11.5–15.5)
RDW: 17.2 % — ABNORMAL HIGH (ref 11.5–15.5)
RDW: 17.3 % — ABNORMAL HIGH (ref 11.5–15.5)
RDW: 17.4 % — ABNORMAL HIGH (ref 11.5–15.5)
WBC: 10.9 10*3/uL — ABNORMAL HIGH (ref 4.0–10.5)
WBC: 13 10*3/uL — ABNORMAL HIGH (ref 4.0–10.5)
WBC: 13.1 10*3/uL — ABNORMAL HIGH (ref 4.0–10.5)
WBC: 18.2 10*3/uL — ABNORMAL HIGH (ref 4.0–10.5)
WBC: 22.4 10*3/uL — ABNORMAL HIGH (ref 4.0–10.5)
WBC: 22.7 10*3/uL — ABNORMAL HIGH (ref 4.0–10.5)
WBC: 10 10*3/uL (ref 4.0–10.5)
WBC: 10.8 10*3/uL — ABNORMAL HIGH (ref 4.0–10.5)
WBC: 10.9 10*3/uL — ABNORMAL HIGH (ref 4.0–10.5)
WBC: 11.1 10*3/uL — ABNORMAL HIGH (ref 4.0–10.5)
WBC: 12.1 10*3/uL — ABNORMAL HIGH (ref 4.0–10.5)
WBC: 6.8 10*3/uL (ref 4.0–10.5)
WBC: 7.8 10*3/uL (ref 4.0–10.5)
WBC: 7.9 10*3/uL (ref 4.0–10.5)
WBC: 8.1 10*3/uL (ref 4.0–10.5)
WBC: 8.1 10*3/uL (ref 4.0–10.5)
WBC: 8.9 10*3/uL (ref 4.0–10.5)

## 2011-02-08 LAB — BASIC METABOLIC PANEL WITH GFR
BUN: 11 mg/dL (ref 6–23)
CO2: 30 meq/L (ref 19–32)
GFR calc non Af Amer: >60 mL/min (ref >60)
Glucose, Bld: 122 mg/dL — ABNORMAL HIGH (ref 70–99)
Potassium: 2.9 meq/L — ABNORMAL LOW (ref 3.5–5.1)

## 2011-02-08 LAB — AFB CULTURE WITH SMEAR (NOT AT ARMC): Acid Fast Smear: NONE SEEN

## 2011-02-08 LAB — ABO/RH: ABO/RH(D): A POS

## 2011-02-08 LAB — BODY FLUID CELL COUNT WITH DIFFERENTIAL
Eos, Fluid: 2 %
Neutrophil Count, Fluid: 24 % (ref 0–25)
Other Cells, Fluid: 0 %

## 2011-02-08 LAB — DIFFERENTIAL
Basophils Absolute: 0 10*3/uL (ref 0.0–0.1)
Basophils Absolute: 0 10*3/uL (ref 0.0–0.1)
Basophils Absolute: 0 K/uL (ref 0.0–0.1)
Basophils Absolute: 0.1 10*3/uL (ref 0.0–0.1)
Basophils Relative: 0 % (ref 0–1)
Basophils Relative: 0 % (ref 0–1)
Basophils Relative: 1 % (ref 0–1)
Eosinophils Absolute: 0 10*3/uL (ref 0.0–0.7)
Eosinophils Absolute: 0 K/uL (ref 0.0–0.7)
Eosinophils Absolute: 0.1 10*3/uL (ref 0.0–0.7)
Eosinophils Relative: 0 % (ref 0–5)
Eosinophils Relative: 0 % (ref 0–5)
Eosinophils Relative: 1 % (ref 0–5)
Lymphocytes Relative: 10 % — ABNORMAL LOW (ref 12–46)
Lymphocytes Relative: 6 % — ABNORMAL LOW (ref 12–46)
Lymphs Abs: 0.9 10*3/uL (ref 0.7–4.0)
Lymphs Abs: 1.4 10*3/uL (ref 0.7–4.0)
Monocytes Absolute: 0.9 10*3/uL (ref 0.1–1.0)
Monocytes Absolute: 0.9 10*3/uL (ref 0.1–1.0)
Monocytes Absolute: 1.5 10*3/uL — ABNORMAL HIGH (ref 0.1–1.0)
Monocytes Absolute: 1.7 K/uL — ABNORMAL HIGH (ref 0.1–1.0)
Monocytes Relative: 4 % (ref 3–12)
Monocytes Relative: 7 % (ref 3–12)
Monocytes Relative: 8 % (ref 3–12)
Neutro Abs: 19.3 10*3/uL — ABNORMAL HIGH (ref 1.7–7.7)
Neutro Abs: 7.9 10*3/uL — ABNORMAL HIGH (ref 1.7–7.7)
Neutrophils Relative %: 86 % — ABNORMAL HIGH (ref 43–77)

## 2011-02-08 LAB — HEPATIC FUNCTION PANEL
ALT: 13 U/L (ref 0–35)
ALT: 13 U/L (ref 0–35)
AST: 18 U/L (ref 0–37)
Albumin: 1.3 g/dL — CL (ref 3.5–5.2)
Alkaline Phosphatase: 45 U/L (ref 39–117)
Bilirubin, Direct: 0.1 mg/dL (ref 0.0–0.3)
Indirect Bilirubin: 0.5 mg/dL (ref 0.3–0.9)
Total Protein: 6 g/dL (ref 6.0–8.3)

## 2011-02-08 LAB — PHOSPHORUS
Phosphorus: 3.7 mg/dL (ref 2.3–4.6)
Phosphorus: 3.4 mg/dL (ref 2.3–4.6)
Phosphorus: 3.9 mg/dL (ref 2.3–4.6)

## 2011-02-08 LAB — CROSSMATCH: ABO/RH(D): A POS

## 2011-02-08 LAB — URINALYSIS, ROUTINE W REFLEX MICROSCOPIC
Bilirubin Urine: NEGATIVE
Bilirubin Urine: NEGATIVE
Glucose, UA: 500 mg/dL — AB
Hgb urine dipstick: NEGATIVE
Ketones, ur: NEGATIVE mg/dL
Nitrite: NEGATIVE
Protein, ur: NEGATIVE mg/dL
Protein, ur: NEGATIVE mg/dL
Specific Gravity, Urine: 1.011 (ref 1.005–1.030)
Urobilinogen, UA: 0.2 mg/dL (ref 0.0–1.0)
Urobilinogen, UA: 0.2 mg/dL (ref 0.0–1.0)
pH: 6.5 (ref 5.0–8.0)

## 2011-02-08 LAB — CULTURE, BLOOD (ROUTINE X 2)
Culture: NO GROWTH
Culture: NO GROWTH

## 2011-02-08 LAB — BODY FLUID CULTURE: Culture: NO GROWTH

## 2011-02-08 LAB — CULTURE, RESPIRATORY W GRAM STAIN
Culture: NORMAL
Gram Stain: NONE SEEN

## 2011-02-08 LAB — CALCIUM, IONIZED: Calcium, Ion: 1.14 mmol/L (ref 1.12–1.32)

## 2011-02-08 LAB — CULTURE, BAL-QUANTITATIVE W GRAM STAIN
Colony Count: 8000
Gram Stain: NONE SEEN

## 2011-02-08 LAB — URINE CULTURE
Colony Count: >=100000
Colony Count: NO GROWTH

## 2011-02-08 LAB — CATH TIP CULTURE: Culture: NO GROWTH

## 2011-02-08 LAB — CHOLESTEROL, BODY FLUID: Cholesterol, Fluid: 73 mg/dL

## 2011-02-08 LAB — C-REACTIVE PROTEIN: CRP: 12 mg/dL — ABNORMAL HIGH (ref <0.6)

## 2011-02-08 LAB — TSH: TSH: 0.441 u[IU]/mL (ref 0.350–4.500)

## 2011-02-08 LAB — HISTOPLASMA ANTIGEN, URINE

## 2011-02-08 LAB — RAPID URINE DRUG SCREEN, HOSP PERFORMED
Amphetamines: NOT DETECTED
Benzodiazepines: NOT DETECTED
Tetrahydrocannabinol: POSITIVE — AB

## 2011-02-08 LAB — MAGNESIUM
Magnesium: 1.8 mg/dL (ref 1.5–2.5)
Magnesium: 1.8 mg/dL (ref 1.5–2.5)
Magnesium: 2.2 mg/dL (ref 1.5–2.5)

## 2011-02-08 LAB — ALBUMIN: Albumin: 2 g/dL — ABNORMAL LOW (ref 3.5–5.2)

## 2011-02-08 LAB — URINE MICROSCOPIC-ADD ON

## 2011-02-08 LAB — LACTATE DEHYDROGENASE, PLEURAL OR PERITONEAL FLUID: LD, Fluid: 124 U/L — ABNORMAL HIGH (ref 3–23)

## 2011-02-08 LAB — SEDIMENTATION RATE: Sed Rate: 77 mm/hr — ABNORMAL HIGH (ref 0–22)

## 2011-02-08 LAB — POTASSIUM: Potassium: 5.7 mEq/L — ABNORMAL HIGH (ref 3.5–5.1)

## 2011-02-08 LAB — C3 COMPLEMENT: C3 Complement: 133 mg/dL (ref 88–201)

## 2011-02-08 LAB — ANA: Anti Nuclear Antibody(ANA): NEGATIVE

## 2011-02-08 LAB — PROTEIN, BODY FLUID: Total protein, fluid: 3.5 g/dL

## 2011-02-08 LAB — MRSA PCR SCREENING: MRSA by PCR: NEGATIVE

## 2011-02-08 LAB — PROTEIN, TOTAL: Total Protein: 6.9 g/dL (ref 6.0–8.3)

## 2011-02-08 LAB — PROTIME-INR: INR: 1.04 (ref 0.00–1.49)

## 2011-02-08 LAB — PATHOLOGIST SMEAR REVIEW

## 2011-02-08 LAB — CRYPTOCOCCAL ANTIGEN

## 2011-02-08 LAB — QUANTIFERON TB GOLD ASSAY (BLOOD)

## 2011-02-09 LAB — RAPID URINE DRUG SCREEN, HOSP PERFORMED
Amphetamines: NOT DETECTED
Benzodiazepines: POSITIVE — AB
Cocaine: NOT DETECTED
Tetrahydrocannabinol: POSITIVE — AB

## 2011-02-09 LAB — CBC
MCHC: 32.4 g/dL (ref 30.0–36.0)
Platelets: 469 10*3/uL — ABNORMAL HIGH (ref 150–400)
RDW: 15.6 % — ABNORMAL HIGH (ref 11.5–15.5)

## 2011-02-09 LAB — DIFFERENTIAL
Basophils Relative: 1 % (ref 0–1)
Eosinophils Absolute: 0.3 10*3/uL (ref 0.0–0.7)
Lymphs Abs: 3.8 10*3/uL (ref 0.7–4.0)
Monocytes Absolute: 0.8 10*3/uL (ref 0.1–1.0)
Monocytes Relative: 7 % (ref 3–12)
Neutro Abs: 6.8 10*3/uL (ref 1.7–7.7)
Neutrophils Relative %: 58 % (ref 43–77)

## 2011-02-09 LAB — URINALYSIS, ROUTINE W REFLEX MICROSCOPIC
Bilirubin Urine: NEGATIVE
Glucose, UA: NEGATIVE mg/dL
Hgb urine dipstick: NEGATIVE
Protein, ur: NEGATIVE mg/dL
Urobilinogen, UA: 0.2 mg/dL (ref 0.0–1.0)

## 2011-02-09 LAB — COMPREHENSIVE METABOLIC PANEL
ALT: 9 U/L (ref 0–35)
Albumin: 3.3 g/dL — ABNORMAL LOW (ref 3.5–5.2)
Alkaline Phosphatase: 63 U/L (ref 39–117)
Calcium: 9 mg/dL (ref 8.4–10.5)
Potassium: 3.6 mEq/L (ref 3.5–5.1)
Sodium: 136 mEq/L (ref 135–145)
Total Protein: 6.7 g/dL (ref 6.0–8.3)

## 2011-02-09 LAB — ETHANOL: Alcohol, Ethyl (B): <5 mg/dL (ref 0–10)

## 2011-02-09 LAB — PREGNANCY, URINE: Preg Test, Ur: NEGATIVE

## 2011-02-11 LAB — BASIC METABOLIC PANEL
BUN: 1 mg/dL — ABNORMAL LOW (ref 6–23)
BUN: 12 mg/dL (ref 6–23)
BUN: 14 mg/dL (ref 6–23)
BUN: 15 mg/dL (ref 6–23)
BUN: 18 mg/dL (ref 6–23)
BUN: 2 mg/dL — ABNORMAL LOW (ref 6–23)
BUN: 5 mg/dL — ABNORMAL LOW (ref 6–23)
BUN: 5 mg/dL — ABNORMAL LOW (ref 6–23)
BUN: 6 mg/dL (ref 6–23)
CO2: 34 mEq/L — ABNORMAL HIGH (ref 19–32)
CO2: 35 mEq/L — ABNORMAL HIGH (ref 19–32)
CO2: 35 mEq/L — ABNORMAL HIGH (ref 19–32)
CO2: 37 mEq/L — ABNORMAL HIGH (ref 19–32)
CO2: 37 mEq/L — ABNORMAL HIGH (ref 19–32)
CO2: 37 mEq/L — ABNORMAL HIGH (ref 19–32)
CO2: 38 mEq/L — ABNORMAL HIGH (ref 19–32)
CO2: 38 mEq/L — ABNORMAL HIGH (ref 19–32)
CO2: 39 mEq/L — ABNORMAL HIGH (ref 19–32)
CO2: 40 mEq/L — ABNORMAL HIGH (ref 19–32)
CO2: 43 mEq/L (ref 19–32)
Calcium: 8.9 mg/dL (ref 8.4–10.5)
Calcium: 9.1 mg/dL (ref 8.4–10.5)
Calcium: 9.3 mg/dL (ref 8.4–10.5)
Calcium: 9.3 mg/dL (ref 8.4–10.5)
Calcium: 9.3 mg/dL (ref 8.4–10.5)
Calcium: 9.4 mg/dL (ref 8.4–10.5)
Calcium: 9.4 mg/dL (ref 8.4–10.5)
Calcium: 9.4 mg/dL (ref 8.4–10.5)
Calcium: 9.5 mg/dL (ref 8.4–10.5)
Calcium: 9.6 mg/dL (ref 8.4–10.5)
Calcium: 9.6 mg/dL (ref 8.4–10.5)
Chloride: 90 mEq/L — ABNORMAL LOW (ref 96–112)
Chloride: 91 mEq/L — ABNORMAL LOW (ref 96–112)
Chloride: 92 mEq/L — ABNORMAL LOW (ref 96–112)
Chloride: 93 mEq/L — ABNORMAL LOW (ref 96–112)
Chloride: 93 mEq/L — ABNORMAL LOW (ref 96–112)
Chloride: 93 mEq/L — ABNORMAL LOW (ref 96–112)
Chloride: 95 mEq/L — ABNORMAL LOW (ref 96–112)
Chloride: 95 mEq/L — ABNORMAL LOW (ref 96–112)
Chloride: 99 mEq/L (ref 96–112)
Creatinine, Ser: 0.31 mg/dL — ABNORMAL LOW (ref 0.4–1.2)
Creatinine, Ser: 0.32 mg/dL — ABNORMAL LOW (ref 0.4–1.2)
Creatinine, Ser: 0.32 mg/dL — ABNORMAL LOW (ref 0.4–1.2)
Creatinine, Ser: 0.32 mg/dL — ABNORMAL LOW (ref 0.4–1.2)
Creatinine, Ser: 0.33 mg/dL — ABNORMAL LOW (ref 0.4–1.2)
Creatinine, Ser: 0.35 mg/dL — ABNORMAL LOW (ref 0.4–1.2)
Creatinine, Ser: 0.38 mg/dL — ABNORMAL LOW (ref 0.4–1.2)
Creatinine, Ser: 0.4 mg/dL (ref 0.4–1.2)
Creatinine, Ser: 0.42 mg/dL (ref 0.4–1.2)
Creatinine, Ser: 0.44 mg/dL (ref 0.4–1.2)
GFR calc Af Amer: >60 mL/min (ref >60)
GFR calc Af Amer: >60 mL/min (ref >60)
GFR calc Af Amer: >60 mL/min (ref >60)
GFR calc Af Amer: >60 mL/min (ref >60)
GFR calc Af Amer: >60 mL/min (ref >60)
GFR calc Af Amer: >60 mL/min (ref >60)
GFR calc Af Amer: >60 mL/min (ref >60)
GFR calc Af Amer: >60 mL/min (ref >60)
GFR calc non Af Amer: >60 mL/min (ref >60)
GFR calc non Af Amer: >60 mL/min (ref >60)
GFR calc non Af Amer: >60 mL/min (ref >60)
GFR calc non Af Amer: >60 mL/min (ref >60)
GFR calc non Af Amer: >60 mL/min (ref >60)
GFR calc non Af Amer: >60 mL/min (ref >60)
GFR calc non Af Amer: >60 mL/min (ref >60)
GFR calc non Af Amer: >60 mL/min (ref >60)
Glucose, Bld: 102 mg/dL — ABNORMAL HIGH (ref 70–99)
Glucose, Bld: 105 mg/dL — ABNORMAL HIGH (ref 70–99)
Glucose, Bld: 110 mg/dL — ABNORMAL HIGH (ref 70–99)
Glucose, Bld: 110 mg/dL — ABNORMAL HIGH (ref 70–99)
Glucose, Bld: 115 mg/dL — ABNORMAL HIGH (ref 70–99)
Glucose, Bld: 121 mg/dL — ABNORMAL HIGH (ref 70–99)
Glucose, Bld: 124 mg/dL — ABNORMAL HIGH (ref 70–99)
Glucose, Bld: 124 mg/dL — ABNORMAL HIGH (ref 70–99)
Glucose, Bld: 124 mg/dL — ABNORMAL HIGH (ref 70–99)
Glucose, Bld: 127 mg/dL — ABNORMAL HIGH (ref 70–99)
Glucose, Bld: 130 mg/dL — ABNORMAL HIGH (ref 70–99)
Potassium: 2.7 mEq/L — CL (ref 3.5–5.1)
Potassium: 3.4 mEq/L — ABNORMAL LOW (ref 3.5–5.1)
Potassium: 3.4 mEq/L — ABNORMAL LOW (ref 3.5–5.1)
Potassium: 3.6 mEq/L (ref 3.5–5.1)
Potassium: 3.7 mEq/L (ref 3.5–5.1)
Potassium: 3.7 mEq/L (ref 3.5–5.1)
Potassium: 3.8 mEq/L (ref 3.5–5.1)
Potassium: 3.9 mEq/L (ref 3.5–5.1)
Potassium: 3.9 mEq/L (ref 3.5–5.1)
Sodium: 134 mEq/L — ABNORMAL LOW (ref 135–145)
Sodium: 136 mEq/L (ref 135–145)
Sodium: 137 mEq/L (ref 135–145)
Sodium: 137 mEq/L (ref 135–145)
Sodium: 137 mEq/L (ref 135–145)
Sodium: 138 mEq/L (ref 135–145)
Sodium: 138 mEq/L (ref 135–145)
Sodium: 138 mEq/L (ref 135–145)
Sodium: 140 mEq/L (ref 135–145)

## 2011-02-11 LAB — DIFFERENTIAL
Basophils Absolute: 0 10*3/uL (ref 0.0–0.1)
Basophils Relative: 1 % (ref 0–1)
Monocytes Absolute: 0.8 10*3/uL (ref 0.1–1.0)
Neutro Abs: 4.7 10*3/uL (ref 1.7–7.7)
Neutrophils Relative %: 57 % (ref 43–77)

## 2011-02-11 LAB — CBC
HCT: 29 % — ABNORMAL LOW (ref 36.0–46.0)
HCT: 29.1 % — ABNORMAL LOW (ref 36.0–46.0)
HCT: 29.3 % — ABNORMAL LOW (ref 36.0–46.0)
HCT: 30.1 % — ABNORMAL LOW (ref 36.0–46.0)
HCT: 30.6 % — ABNORMAL LOW (ref 36.0–46.0)
HCT: 31.5 % — ABNORMAL LOW (ref 36.0–46.0)
HCT: 32.2 % — ABNORMAL LOW (ref 36.0–46.0)
HCT: 32.2 % — ABNORMAL LOW (ref 36.0–46.0)
HCT: 32.7 % — ABNORMAL LOW (ref 36.0–46.0)
HCT: 32.9 % — ABNORMAL LOW (ref 36.0–46.0)
Hemoglobin: 10 g/dL — ABNORMAL LOW (ref 12.0–15.0)
Hemoglobin: 11 g/dL — ABNORMAL LOW (ref 12.0–15.0)
Hemoglobin: 11.1 g/dL — ABNORMAL LOW (ref 12.0–15.0)
Hemoglobin: 11.1 g/dL — ABNORMAL LOW (ref 12.0–15.0)
Hemoglobin: 11.1 g/dL — ABNORMAL LOW (ref 12.0–15.0)
Hemoglobin: 11.2 g/dL — ABNORMAL LOW (ref 12.0–15.0)
Hemoglobin: 9.7 g/dL — ABNORMAL LOW (ref 12.0–15.0)
Hemoglobin: 9.8 g/dL — ABNORMAL LOW (ref 12.0–15.0)
MCHC: 33 g/dL (ref 30.0–36.0)
MCHC: 33.1 g/dL (ref 30.0–36.0)
MCHC: 33.3 g/dL (ref 30.0–36.0)
MCHC: 33.5 g/dL (ref 30.0–36.0)
MCHC: 33.6 g/dL (ref 30.0–36.0)
MCHC: 33.7 g/dL (ref 30.0–36.0)
MCHC: 33.7 g/dL (ref 30.0–36.0)
MCHC: 33.7 g/dL (ref 30.0–36.0)
MCHC: 34.1 g/dL (ref 30.0–36.0)
MCV: 82.4 fL (ref 78.0–100.0)
MCV: 82.5 fL (ref 78.0–100.0)
MCV: 83 fL (ref 78.0–100.0)
MCV: 84.8 fL (ref 78.0–100.0)
MCV: 84.9 fL (ref 78.0–100.0)
MCV: 85.8 fL (ref 78.0–100.0)
Platelets: 191 10*3/uL (ref 150–400)
Platelets: 194 10*3/uL (ref 150–400)
Platelets: 196 10*3/uL (ref 150–400)
Platelets: 216 10*3/uL (ref 150–400)
Platelets: 236 10*3/uL (ref 150–400)
Platelets: 238 10*3/uL (ref 150–400)
Platelets: 245 10*3/uL (ref 150–400)
Platelets: 271 10*3/uL (ref 150–400)
Platelets: 275 10*3/uL (ref 150–400)
Platelets: 338 10*3/uL (ref 150–400)
RBC: 3.43 MIL/uL — ABNORMAL LOW (ref 3.87–5.11)
RBC: 3.62 MIL/uL — ABNORMAL LOW (ref 3.87–5.11)
RBC: 3.69 MIL/uL — ABNORMAL LOW (ref 3.87–5.11)
RBC: 3.72 MIL/uL — ABNORMAL LOW (ref 3.87–5.11)
RBC: 3.87 MIL/uL (ref 3.87–5.11)
RBC: 3.88 MIL/uL (ref 3.87–5.11)
RBC: 3.93 MIL/uL (ref 3.87–5.11)
RBC: 3.95 MIL/uL (ref 3.87–5.11)
RBC: 3.98 MIL/uL (ref 3.87–5.11)
RBC: 3.99 MIL/uL (ref 3.87–5.11)
RBC: 4.01 MIL/uL (ref 3.87–5.11)
RBC: 4.05 MIL/uL (ref 3.87–5.11)
RDW: 17.4 % — ABNORMAL HIGH (ref 11.5–15.5)
RDW: 17.5 % — ABNORMAL HIGH (ref 11.5–15.5)
RDW: 17.5 % — ABNORMAL HIGH (ref 11.5–15.5)
RDW: 17.5 % — ABNORMAL HIGH (ref 11.5–15.5)
RDW: 17.7 % — ABNORMAL HIGH (ref 11.5–15.5)
RDW: 17.7 % — ABNORMAL HIGH (ref 11.5–15.5)
RDW: 18.1 % — ABNORMAL HIGH (ref 11.5–15.5)
RDW: 18.1 % — ABNORMAL HIGH (ref 11.5–15.5)
RDW: 19.4 % — ABNORMAL HIGH (ref 11.5–15.5)
WBC: 5.3 10*3/uL (ref 4.0–10.5)
WBC: 6.4 10*3/uL (ref 4.0–10.5)
WBC: 6.6 10*3/uL (ref 4.0–10.5)
WBC: 7.7 10*3/uL (ref 4.0–10.5)
WBC: 7.9 10*3/uL (ref 4.0–10.5)
WBC: 8.5 10*3/uL (ref 4.0–10.5)
WBC: 8.7 10*3/uL (ref 4.0–10.5)
WBC: 9.2 10*3/uL (ref 4.0–10.5)

## 2011-02-11 LAB — GLUCOSE, CAPILLARY
Glucose-Capillary: 101 mg/dL — ABNORMAL HIGH (ref 70–99)
Glucose-Capillary: 102 mg/dL — ABNORMAL HIGH (ref 70–99)
Glucose-Capillary: 102 mg/dL — ABNORMAL HIGH (ref 70–99)
Glucose-Capillary: 105 mg/dL — ABNORMAL HIGH (ref 70–99)
Glucose-Capillary: 105 mg/dL — ABNORMAL HIGH (ref 70–99)
Glucose-Capillary: 106 mg/dL — ABNORMAL HIGH (ref 70–99)
Glucose-Capillary: 106 mg/dL — ABNORMAL HIGH (ref 70–99)
Glucose-Capillary: 107 mg/dL — ABNORMAL HIGH (ref 70–99)
Glucose-Capillary: 107 mg/dL — ABNORMAL HIGH (ref 70–99)
Glucose-Capillary: 108 mg/dL — ABNORMAL HIGH (ref 70–99)
Glucose-Capillary: 108 mg/dL — ABNORMAL HIGH (ref 70–99)
Glucose-Capillary: 108 mg/dL — ABNORMAL HIGH (ref 70–99)
Glucose-Capillary: 109 mg/dL — ABNORMAL HIGH (ref 70–99)
Glucose-Capillary: 109 mg/dL — ABNORMAL HIGH (ref 70–99)
Glucose-Capillary: 109 mg/dL — ABNORMAL HIGH (ref 70–99)
Glucose-Capillary: 109 mg/dL — ABNORMAL HIGH (ref 70–99)
Glucose-Capillary: 110 mg/dL — ABNORMAL HIGH (ref 70–99)
Glucose-Capillary: 111 mg/dL — ABNORMAL HIGH (ref 70–99)
Glucose-Capillary: 111 mg/dL — ABNORMAL HIGH (ref 70–99)
Glucose-Capillary: 111 mg/dL — ABNORMAL HIGH (ref 70–99)
Glucose-Capillary: 111 mg/dL — ABNORMAL HIGH (ref 70–99)
Glucose-Capillary: 112 mg/dL — ABNORMAL HIGH (ref 70–99)
Glucose-Capillary: 112 mg/dL — ABNORMAL HIGH (ref 70–99)
Glucose-Capillary: 113 mg/dL — ABNORMAL HIGH (ref 70–99)
Glucose-Capillary: 114 mg/dL — ABNORMAL HIGH (ref 70–99)
Glucose-Capillary: 114 mg/dL — ABNORMAL HIGH (ref 70–99)
Glucose-Capillary: 114 mg/dL — ABNORMAL HIGH (ref 70–99)
Glucose-Capillary: 115 mg/dL — ABNORMAL HIGH (ref 70–99)
Glucose-Capillary: 116 mg/dL — ABNORMAL HIGH (ref 70–99)
Glucose-Capillary: 116 mg/dL — ABNORMAL HIGH (ref 70–99)
Glucose-Capillary: 116 mg/dL — ABNORMAL HIGH (ref 70–99)
Glucose-Capillary: 117 mg/dL — ABNORMAL HIGH (ref 70–99)
Glucose-Capillary: 117 mg/dL — ABNORMAL HIGH (ref 70–99)
Glucose-Capillary: 117 mg/dL — ABNORMAL HIGH (ref 70–99)
Glucose-Capillary: 118 mg/dL — ABNORMAL HIGH (ref 70–99)
Glucose-Capillary: 118 mg/dL — ABNORMAL HIGH (ref 70–99)
Glucose-Capillary: 118 mg/dL — ABNORMAL HIGH (ref 70–99)
Glucose-Capillary: 119 mg/dL — ABNORMAL HIGH (ref 70–99)
Glucose-Capillary: 119 mg/dL — ABNORMAL HIGH (ref 70–99)
Glucose-Capillary: 120 mg/dL — ABNORMAL HIGH (ref 70–99)
Glucose-Capillary: 122 mg/dL — ABNORMAL HIGH (ref 70–99)
Glucose-Capillary: 124 mg/dL — ABNORMAL HIGH (ref 70–99)
Glucose-Capillary: 125 mg/dL — ABNORMAL HIGH (ref 70–99)
Glucose-Capillary: 126 mg/dL — ABNORMAL HIGH (ref 70–99)
Glucose-Capillary: 126 mg/dL — ABNORMAL HIGH (ref 70–99)
Glucose-Capillary: 126 mg/dL — ABNORMAL HIGH (ref 70–99)
Glucose-Capillary: 126 mg/dL — ABNORMAL HIGH (ref 70–99)
Glucose-Capillary: 127 mg/dL — ABNORMAL HIGH (ref 70–99)
Glucose-Capillary: 129 mg/dL — ABNORMAL HIGH (ref 70–99)
Glucose-Capillary: 129 mg/dL — ABNORMAL HIGH (ref 70–99)
Glucose-Capillary: 131 mg/dL — ABNORMAL HIGH (ref 70–99)
Glucose-Capillary: 133 mg/dL — ABNORMAL HIGH (ref 70–99)
Glucose-Capillary: 134 mg/dL — ABNORMAL HIGH (ref 70–99)
Glucose-Capillary: 134 mg/dL — ABNORMAL HIGH (ref 70–99)
Glucose-Capillary: 134 mg/dL — ABNORMAL HIGH (ref 70–99)
Glucose-Capillary: 136 mg/dL — ABNORMAL HIGH (ref 70–99)
Glucose-Capillary: 136 mg/dL — ABNORMAL HIGH (ref 70–99)
Glucose-Capillary: 136 mg/dL — ABNORMAL HIGH (ref 70–99)
Glucose-Capillary: 138 mg/dL — ABNORMAL HIGH (ref 70–99)
Glucose-Capillary: 138 mg/dL — ABNORMAL HIGH (ref 70–99)
Glucose-Capillary: 139 mg/dL — ABNORMAL HIGH (ref 70–99)
Glucose-Capillary: 141 mg/dL — ABNORMAL HIGH (ref 70–99)
Glucose-Capillary: 141 mg/dL — ABNORMAL HIGH (ref 70–99)
Glucose-Capillary: 141 mg/dL — ABNORMAL HIGH (ref 70–99)
Glucose-Capillary: 142 mg/dL — ABNORMAL HIGH (ref 70–99)
Glucose-Capillary: 144 mg/dL — ABNORMAL HIGH (ref 70–99)
Glucose-Capillary: 152 mg/dL — ABNORMAL HIGH (ref 70–99)
Glucose-Capillary: 153 mg/dL — ABNORMAL HIGH (ref 70–99)
Glucose-Capillary: 163 mg/dL — ABNORMAL HIGH (ref 70–99)
Glucose-Capillary: 165 mg/dL — ABNORMAL HIGH (ref 70–99)
Glucose-Capillary: 171 mg/dL — ABNORMAL HIGH (ref 70–99)
Glucose-Capillary: 181 mg/dL — ABNORMAL HIGH (ref 70–99)
Glucose-Capillary: 191 mg/dL — ABNORMAL HIGH (ref 70–99)
Glucose-Capillary: 91 mg/dL (ref 70–99)
Glucose-Capillary: 92 mg/dL (ref 70–99)
Glucose-Capillary: 92 mg/dL (ref 70–99)
Glucose-Capillary: 96 mg/dL (ref 70–99)
Glucose-Capillary: 97 mg/dL (ref 70–99)
Glucose-Capillary: 98 mg/dL (ref 70–99)

## 2011-02-11 LAB — CLOSTRIDIUM DIFFICILE EIA: C difficile Toxins A+B, EIA: NEGATIVE

## 2011-02-11 LAB — BLOOD GAS, ARTERIAL
TCO2: 35 mmol/L (ref 0–100)
pCO2 arterial: 59.2 mmHg (ref 35.0–45.0)
pH, Arterial: 7.428 — ABNORMAL HIGH (ref 7.350–7.400)

## 2011-02-11 LAB — COMPREHENSIVE METABOLIC PANEL
Albumin: 3.2 g/dL — ABNORMAL LOW (ref 3.5–5.2)
BUN: 5 mg/dL — ABNORMAL LOW (ref 6–23)
Chloride: 93 mEq/L — ABNORMAL LOW (ref 96–112)
Creatinine, Ser: 0.53 mg/dL (ref 0.4–1.2)
Glucose, Bld: 114 mg/dL — ABNORMAL HIGH (ref 70–99)
Total Bilirubin: 0.7 mg/dL (ref 0.3–1.2)
Total Protein: 6.6 g/dL (ref 6.0–8.3)

## 2011-02-11 LAB — HEPATIC FUNCTION PANEL
ALT: 48 U/L — ABNORMAL HIGH (ref 0–35)
Albumin: 2.7 g/dL — ABNORMAL LOW (ref 3.5–5.2)
Alkaline Phosphatase: 62 U/L (ref 39–117)
Alkaline Phosphatase: 70 U/L (ref 39–117)
Bilirubin, Direct: 0.1 mg/dL (ref 0.0–0.3)
Indirect Bilirubin: 0.3 mg/dL (ref 0.3–0.9)
Total Bilirubin: 0.8 mg/dL (ref 0.3–1.2)

## 2011-02-11 LAB — AMMONIA
Ammonia: 140 umol/L — ABNORMAL HIGH (ref 11–35)
Ammonia: 60 umol/L — ABNORMAL HIGH (ref 11–35)

## 2011-02-11 LAB — MAGNESIUM: Magnesium: 1.9 mg/dL (ref 1.5–2.5)

## 2011-02-11 LAB — PHOSPHORUS: Phosphorus: 5.8 mg/dL — ABNORMAL HIGH (ref 2.3–4.6)

## 2011-02-16 LAB — COMPREHENSIVE METABOLIC PANEL
Albumin: 3.3 g/dL — ABNORMAL LOW (ref 3.5–5.2)
Alkaline Phosphatase: 63 U/L (ref 39–117)
BUN: 2 mg/dL — ABNORMAL LOW (ref 6–23)
Calcium: 9.2 mg/dL (ref 8.4–10.5)
Glucose, Bld: 97 mg/dL (ref 70–99)
Potassium: 3.9 mEq/L (ref 3.5–5.1)
Sodium: 139 mEq/L (ref 135–145)
Total Protein: 6.2 g/dL (ref 6.0–8.3)

## 2011-02-16 LAB — URINE MICROSCOPIC-ADD ON

## 2011-02-16 LAB — CBC
HCT: 36.7 % (ref 36.0–46.0)
HCT: 38.8 % (ref 36.0–46.0)
Hemoglobin: 13.3 g/dL (ref 12.0–15.0)
MCHC: 34.2 g/dL (ref 30.0–36.0)
MCHC: 35.3 g/dL (ref 30.0–36.0)
MCV: 81.9 fL (ref 78.0–100.0)
Platelets: 299 10*3/uL (ref 150–400)
Platelets: 407 10*3/uL — ABNORMAL HIGH (ref 150–400)
RDW: 17.2 % — ABNORMAL HIGH (ref 11.5–15.5)
RDW: 17.3 % — ABNORMAL HIGH (ref 11.5–15.5)

## 2011-02-16 LAB — BASIC METABOLIC PANEL
BUN: 4 mg/dL — ABNORMAL LOW (ref 6–23)
CO2: 32 mEq/L (ref 19–32)
Chloride: 96 mEq/L (ref 96–112)
Creatinine, Ser: 0.44 mg/dL (ref 0.4–1.2)
Glucose, Bld: 94 mg/dL (ref 70–99)

## 2011-02-16 LAB — URINALYSIS, ROUTINE W REFLEX MICROSCOPIC
Bilirubin Urine: NEGATIVE
Ketones, ur: NEGATIVE mg/dL
Nitrite: NEGATIVE
Nitrite: POSITIVE — AB
Protein, ur: NEGATIVE mg/dL
Specific Gravity, Urine: 1.01 (ref 1.005–1.030)
Urobilinogen, UA: 0.2 mg/dL (ref 0.0–1.0)

## 2011-02-16 LAB — DIFFERENTIAL
Basophils Relative: 1 % (ref 0–1)
Eosinophils Absolute: 0.1 10*3/uL (ref 0.0–0.7)
Eosinophils Relative: 0 % (ref 0–5)
Lymphs Abs: 3.8 10*3/uL (ref 0.7–4.0)
Monocytes Absolute: 0.9 10*3/uL (ref 0.1–1.0)
Monocytes Relative: 8 % (ref 3–12)
Neutro Abs: 6.6 10*3/uL (ref 1.7–7.7)
Neutrophils Relative %: 57 % (ref 43–77)
Neutrophils Relative %: 70 % (ref 43–77)

## 2011-02-16 LAB — PREGNANCY, URINE: Preg Test, Ur: NEGATIVE

## 2011-02-23 ENCOUNTER — Encounter (HOSPITAL_COMMUNITY)
Admission: RE | Admit: 2011-02-23 | Discharge: 2011-02-23 | Disposition: A | Discharge status: Home / Self Care | Payer: Medicaid Other | Source: Ambulatory Visit | Attending: Neurosurgery | Admitting: Neurosurgery

## 2011-02-23 ENCOUNTER — Ambulatory Visit (HOSPITAL_COMMUNITY)
Admission: RE | Admit: 2011-02-23 | Discharge: 2011-02-23 | Disposition: A | Discharge status: Home / Self Care | Payer: Medicaid Other | Source: Ambulatory Visit | Attending: Neurosurgery | Admitting: Neurosurgery

## 2011-02-23 ENCOUNTER — Other Ambulatory Visit (HOSPITAL_COMMUNITY): Payer: Self-pay | Admitting: Neurosurgery

## 2011-02-23 DIAGNOSIS — M5126 Other intervertebral disc displacement, lumbar region: Secondary | ICD-10-CM | POA: Insufficient documentation

## 2011-02-23 DIAGNOSIS — Z01811 Encounter for preprocedural respiratory examination: Secondary | ICD-10-CM | POA: Insufficient documentation

## 2011-02-23 DIAGNOSIS — J4489 Other specified chronic obstructive pulmonary disease: Secondary | ICD-10-CM | POA: Insufficient documentation

## 2011-02-23 DIAGNOSIS — J449 Chronic obstructive pulmonary disease, unspecified: Secondary | ICD-10-CM | POA: Insufficient documentation

## 2011-02-23 DIAGNOSIS — Z01812 Encounter for preprocedural laboratory examination: Secondary | ICD-10-CM | POA: Insufficient documentation

## 2011-02-23 DIAGNOSIS — Z01818 Encounter for other preprocedural examination: Secondary | ICD-10-CM | POA: Insufficient documentation

## 2011-02-23 LAB — BASIC METABOLIC PANEL
BUN: 5 mg/dL — ABNORMAL LOW (ref 6–23)
CO2: 37 mEq/L — ABNORMAL HIGH (ref 19–32)
Calcium: 9.2 mg/dL (ref 8.4–10.5)
Glucose, Bld: 90 mg/dL (ref 70–99)
Sodium: 137 mEq/L (ref 135–145)

## 2011-02-23 LAB — CBC
HCT: 44.4 % (ref 36.0–46.0)
Hemoglobin: 13.7 g/dL (ref 12.0–15.0)
MCH: 25.9 pg — ABNORMAL LOW (ref 26.0–34.0)
MCHC: 30.9 g/dL (ref 30.0–36.0)
MCV: 83.9 fL (ref 78.0–100.0)
RDW: 16.6 % — ABNORMAL HIGH (ref 11.5–15.5)

## 2011-02-23 LAB — DIFFERENTIAL
Basophils Absolute: 0 10*3/uL (ref 0.0–0.1)
Eosinophils Relative: 2 % (ref 0–5)
Lymphocytes Relative: 23 % (ref 12–46)
Lymphs Abs: 3.4 10*3/uL (ref 0.7–4.0)
Monocytes Absolute: 0.9 10*3/uL (ref 0.1–1.0)
Monocytes Relative: 6 % (ref 3–12)
Neutro Abs: 9.9 10*3/uL — ABNORMAL HIGH (ref 1.7–7.7)

## 2011-02-26 ENCOUNTER — Inpatient Hospital Stay (HOSPITAL_COMMUNITY)
Admission: AD | Admit: 2011-02-26 | Discharge: 2011-03-06 | DRG: 871 | Disposition: A | Discharge status: Home / Self Care | Payer: Medicaid Other | Source: Other Acute Inpatient Hospital | Attending: Emergency Medicine | Admitting: Emergency Medicine

## 2011-02-26 ENCOUNTER — Emergency Department (HOSPITAL_COMMUNITY): Payer: Medicaid Other

## 2011-02-26 ENCOUNTER — Emergency Department (HOSPITAL_COMMUNITY)
Admission: EM | Admit: 2011-02-26 | Discharge: 2011-02-26 | Disposition: A | Discharge status: Other Acute Inpatient Hospital | Payer: Medicaid Other | Source: Home / Self Care | Attending: Emergency Medicine | Admitting: Emergency Medicine

## 2011-02-26 ENCOUNTER — Inpatient Hospital Stay (HOSPITAL_COMMUNITY): Payer: Medicaid Other

## 2011-02-26 ENCOUNTER — Ambulatory Visit (HOSPITAL_COMMUNITY): Admission: RE | Admit: 2011-02-26 | Payer: Medicaid Other | Source: Ambulatory Visit | Admitting: Neurosurgery

## 2011-02-26 DIAGNOSIS — E876 Hypokalemia: Secondary | ICD-10-CM | POA: Diagnosis present

## 2011-02-26 DIAGNOSIS — J159 Unspecified bacterial pneumonia: Secondary | ICD-10-CM

## 2011-02-26 DIAGNOSIS — J9600 Acute respiratory failure, unspecified whether with hypoxia or hypercapnia: Secondary | ICD-10-CM

## 2011-02-26 DIAGNOSIS — F17200 Nicotine dependence, unspecified, uncomplicated: Secondary | ICD-10-CM | POA: Diagnosis present

## 2011-02-26 DIAGNOSIS — T380X5A Adverse effect of glucocorticoids and synthetic analogues, initial encounter: Secondary | ICD-10-CM | POA: Diagnosis present

## 2011-02-26 DIAGNOSIS — F1210 Cannabis abuse, uncomplicated: Secondary | ICD-10-CM | POA: Diagnosis present

## 2011-02-26 DIAGNOSIS — A419 Sepsis, unspecified organism: Principal | ICD-10-CM | POA: Diagnosis present

## 2011-02-26 DIAGNOSIS — J441 Chronic obstructive pulmonary disease with (acute) exacerbation: Secondary | ICD-10-CM | POA: Diagnosis present

## 2011-02-26 DIAGNOSIS — J9620 Acute and chronic respiratory failure, unspecified whether with hypoxia or hypercapnia: Secondary | ICD-10-CM | POA: Diagnosis present

## 2011-02-26 DIAGNOSIS — E872 Acidosis, unspecified: Secondary | ICD-10-CM | POA: Diagnosis not present

## 2011-02-26 DIAGNOSIS — F319 Bipolar disorder, unspecified: Secondary | ICD-10-CM | POA: Diagnosis present

## 2011-02-26 DIAGNOSIS — D649 Anemia, unspecified: Secondary | ICD-10-CM | POA: Diagnosis not present

## 2011-02-26 DIAGNOSIS — R7309 Other abnormal glucose: Secondary | ICD-10-CM | POA: Diagnosis present

## 2011-02-26 LAB — BLOOD GAS, ARTERIAL
Acid-Base Excess: 7.2 mmol/L — ABNORMAL HIGH (ref 0.0–2.0)
Bicarbonate: 40 mEq/L — ABNORMAL HIGH (ref 20.0–24.0)
Bicarbonate: 34.4 mEq/L — ABNORMAL HIGH (ref 20.0–24.0)
FIO2: 50 %
Patient temperature: 37
Patient temperature: 37
TCO2: 28.5 mmol/L (ref 0–100)
pCO2 arterial: 116 mmHg (ref 35.0–45.0)
pCO2 arterial: 83.9 mmHg (ref 35.0–45.0)
pH, Arterial: 7.164 — CL (ref 7.350–7.400)
pH, Arterial: 7.237 — ABNORMAL LOW (ref 7.350–7.400)
pO2, Arterial: 41.4 mmHg — ABNORMAL LOW (ref 80.0–100.0)

## 2011-02-26 LAB — DIFFERENTIAL
Basophils Relative: 0 % (ref 0–1)
Eosinophils Relative: 0 % (ref 0–5)
Monocytes Relative: 9 % (ref 3–12)
Neutrophils Relative %: 88 % — ABNORMAL HIGH (ref 43–77)

## 2011-02-26 LAB — BASIC METABOLIC PANEL
CO2: 38 mEq/L — ABNORMAL HIGH (ref 19–32)
Chloride: 90 mEq/L — ABNORMAL LOW (ref 96–112)
GFR calc non Af Amer: >60 mL/min (ref >60)
Glucose, Bld: 151 mg/dL — ABNORMAL HIGH (ref 70–99)
Potassium: 4 mEq/L (ref 3.5–5.1)
Sodium: 137 mEq/L (ref 135–145)

## 2011-02-26 LAB — URINE MICROSCOPIC-ADD ON

## 2011-02-26 LAB — CBC
HCT: 43.2 % (ref 36.0–46.0)
Hemoglobin: 12.9 g/dL (ref 12.0–15.0)
MCV: 85.7 fL (ref 78.0–100.0)
RBC: 5.04 MIL/uL (ref 3.87–5.11)
WBC: 32.2 10*3/uL — ABNORMAL HIGH (ref 4.0–10.5)

## 2011-02-26 LAB — POCT I-STAT 3, ART BLOOD GAS (G3+)
pCO2 arterial: 74 mmHg (ref 35.0–45.0)
pO2, Arterial: 55 mmHg — ABNORMAL LOW (ref 80.0–100.0)

## 2011-02-26 LAB — URINALYSIS, ROUTINE W REFLEX MICROSCOPIC
Leukocytes, UA: NEGATIVE
Nitrite: NEGATIVE
Protein, ur: 100 mg/dL — AB
Urobilinogen, UA: 1 mg/dL (ref 0.0–1.0)

## 2011-02-26 LAB — GLUCOSE, CAPILLARY: Glucose-Capillary: 167 mg/dL — ABNORMAL HIGH (ref 70–99)

## 2011-02-27 ENCOUNTER — Inpatient Hospital Stay (HOSPITAL_COMMUNITY): Payer: Medicaid Other

## 2011-02-27 DIAGNOSIS — R579 Shock, unspecified: Secondary | ICD-10-CM

## 2011-02-27 LAB — BLOOD GAS, ARTERIAL
Acid-Base Excess: 4.4 mmol/L — ABNORMAL HIGH (ref 0.0–2.0)
Bicarbonate: 29.6 mEq/L — ABNORMAL HIGH (ref 20.0–24.0)
Bicarbonate: 32.9 mEq/L — ABNORMAL HIGH (ref 20.0–24.0)
Delivery systems: POSITIVE
Expiratory PAP: 6
Inspiratory PAP: 10
O2 Content: 2 L/min
O2 Saturation: 91.7 %
O2 Saturation: 92.1 %
Patient temperature: 37
TCO2: 23.7 mmol/L (ref 0–100)
TCO2: 26.2 mmol/L (ref 0–100)
pCO2 arterial: 68.2 mmHg (ref 35.0–45.0)
pH, Arterial: 7.304 — ABNORMAL LOW (ref 7.350–7.400)
pO2, Arterial: 61.5 mmHg — ABNORMAL LOW (ref 80.0–100.0)

## 2011-02-27 LAB — TYPE AND SCREEN
ABO/RH(D): A POS
ABO/RH(D): A POS
Antibody Screen: NEGATIVE

## 2011-02-27 LAB — CBC
HCT: 40.5 % (ref 36.0–46.0)
Hemoglobin: 10.1 g/dL — ABNORMAL LOW (ref 12.0–15.0)
Hemoglobin: 13.6 g/dL (ref 12.0–15.0)
Hemoglobin: 15.3 g/dL — ABNORMAL HIGH (ref 12.0–15.0)
MCHC: 33.5 g/dL (ref 30.0–36.0)
MCHC: 33.7 g/dL (ref 30.0–36.0)
MCV: 87.3 fL (ref 78.0–100.0)
MCV: 87.4 fL (ref 78.0–100.0)
Platelets: 289 10*3/uL (ref 150–400)
Platelets: 240 10*3/uL (ref 150–400)
Platelets: 247 10*3/uL (ref 150–400)
Platelets: 294 10*3/uL (ref 150–400)
RBC: 4.01 MIL/uL (ref 3.87–5.11)
RBC: 4.63 MIL/uL (ref 3.87–5.11)
RBC: 5.3 MIL/uL — ABNORMAL HIGH (ref 3.87–5.11)
RDW: 14 % (ref 11.5–15.5)
RDW: 14.3 % (ref 11.5–15.5)
RDW: 15.1 % (ref 11.5–15.5)
WBC: 23.7 10*3/uL — ABNORMAL HIGH (ref 4.0–10.5)
WBC: 14.6 10*3/uL — ABNORMAL HIGH (ref 4.0–10.5)
WBC: 15.1 10*3/uL — ABNORMAL HIGH (ref 4.0–10.5)
WBC: 21.4 10*3/uL — ABNORMAL HIGH (ref 4.0–10.5)

## 2011-02-27 LAB — BASIC METABOLIC PANEL
BUN: 4 mg/dL — ABNORMAL LOW (ref 6–23)
BUN: 8 mg/dL (ref 6–23)
BUN: 8 mg/dL (ref 6–23)
CO2: 31 mEq/L (ref 19–32)
CO2: 34 mEq/L — ABNORMAL HIGH (ref 19–32)
Calcium: 8.8 mg/dL (ref 8.4–10.5)
Calcium: 9 mg/dL (ref 8.4–10.5)
Calcium: 9.1 mg/dL (ref 8.4–10.5)
Chloride: 100 mEq/L (ref 96–112)
Chloride: 102 mEq/L (ref 96–112)
Creatinine, Ser: 0.49 mg/dL (ref 0.4–1.2)
Creatinine, Ser: 0.51 mg/dL (ref 0.4–1.2)
Creatinine, Ser: 0.62 mg/dL (ref 0.4–1.2)
GFR calc Af Amer: >60 mL/min (ref >60)
GFR calc Af Amer: >60 mL/min (ref >60)
GFR calc Af Amer: >60 mL/min (ref >60)
GFR calc non Af Amer: >60 mL/min (ref >60)
GFR calc non Af Amer: >60 mL/min (ref >60)
Glucose, Bld: 135 mg/dL — ABNORMAL HIGH (ref 70–99)
Glucose, Bld: 147 mg/dL — ABNORMAL HIGH (ref 70–99)
Potassium: 3.7 mEq/L (ref 3.5–5.1)
Sodium: 137 mEq/L (ref 135–145)
Sodium: 139 mEq/L (ref 135–145)

## 2011-02-27 LAB — COMPREHENSIVE METABOLIC PANEL
ALT: 11 U/L (ref 0–35)
AST: 17 U/L (ref 0–37)
Albumin: 2.4 g/dL — ABNORMAL LOW (ref 3.5–5.2)
Alkaline Phosphatase: 65 U/L (ref 39–117)
Alkaline Phosphatase: 64 U/L (ref 39–117)
BUN: 10 mg/dL (ref 6–23)
Calcium: 7.6 mg/dL — ABNORMAL LOW (ref 8.4–10.5)
Calcium: 9.2 mg/dL (ref 8.4–10.5)
Creatinine, Ser: 0.51 mg/dL (ref 0.4–1.2)
GFR calc Af Amer: >60 mL/min (ref >60)
Glucose, Bld: 123 mg/dL — ABNORMAL HIGH (ref 70–99)
Glucose, Bld: 97 mg/dL (ref 70–99)
Potassium: 4.8 mEq/L (ref 3.5–5.1)
Potassium: 3.6 mEq/L (ref 3.5–5.1)
Sodium: 137 mEq/L (ref 135–145)
Total Protein: 5.9 g/dL — ABNORMAL LOW (ref 6.0–8.3)
Total Protein: 6.9 g/dL (ref 6.0–8.3)

## 2011-02-27 LAB — POCT I-STAT 3, ART BLOOD GAS (G3+)
Acid-Base Excess: 7 mmol/L — ABNORMAL HIGH (ref 0.0–2.0)
Bicarbonate: 36.4 mEq/L — ABNORMAL HIGH (ref 20.0–24.0)
Bicarbonate: 36.4 mEq/L — ABNORMAL HIGH (ref 20.0–24.0)
Patient temperature: 98.7
Patient temperature: 100
pCO2 arterial: 91.1 mmHg (ref 35.0–45.0)
pH, Arterial: 7.209 — ABNORMAL LOW (ref 7.350–7.400)

## 2011-02-27 LAB — GLUCOSE, CAPILLARY
Glucose-Capillary: 112 mg/dL — ABNORMAL HIGH (ref 70–99)
Glucose-Capillary: 160 mg/dL — ABNORMAL HIGH (ref 70–99)
Glucose-Capillary: 187 mg/dL — ABNORMAL HIGH (ref 70–99)

## 2011-02-27 LAB — DIFFERENTIAL
Basophils Absolute: 0 10*3/uL (ref 0.0–0.1)
Basophils Absolute: 0 10*3/uL (ref 0.0–0.1)
Basophils Absolute: 0 10*3/uL (ref 0.0–0.1)
Basophils Relative: 0 % (ref 0–1)
Basophils Relative: 0 % (ref 0–1)
Basophils Relative: 0 % (ref 0–1)
Eosinophils Absolute: 0 10*3/uL (ref 0.0–0.7)
Eosinophils Absolute: 0 10*3/uL (ref 0.0–0.7)
Eosinophils Absolute: 0.1 10*3/uL (ref 0.0–0.7)
Eosinophils Relative: 0 % (ref 0–5)
Eosinophils Relative: 1 % (ref 0–5)
Lymphocytes Relative: 3 % — ABNORMAL LOW (ref 12–46)
Lymphocytes Relative: 4 % — ABNORMAL LOW (ref 12–46)
Lymphocytes Relative: 5 % — ABNORMAL LOW (ref 12–46)
Lymphs Abs: 0.5 10*3/uL — ABNORMAL LOW (ref 0.7–4.0)
Lymphs Abs: 0.8 10*3/uL (ref 0.7–4.0)
Lymphs Abs: 0.8 10*3/uL (ref 0.7–4.0)
Lymphs Abs: 2.4 10*3/uL (ref 0.7–4.0)
Monocytes Absolute: 0.2 10*3/uL (ref 0.1–1.0)
Monocytes Absolute: 0.9 10*3/uL (ref 0.1–1.0)
Monocytes Relative: 10 % (ref 3–12)
Monocytes Relative: 1 % — ABNORMAL LOW (ref 3–12)
Monocytes Relative: 8 % (ref 3–12)
Neutro Abs: 20.1 10*3/uL — ABNORMAL HIGH (ref 1.7–7.7)
Neutro Abs: 14 10*3/uL — ABNORMAL HIGH (ref 1.7–7.7)
Neutro Abs: 18.8 10*3/uL — ABNORMAL HIGH (ref 1.7–7.7)
Neutro Abs: 19.8 10*3/uL — ABNORMAL HIGH (ref 1.7–7.7)
Neutrophils Relative %: 85 % — ABNORMAL HIGH (ref 43–77)
Neutrophils Relative %: 91 % — ABNORMAL HIGH (ref 43–77)
Neutrophils Relative %: 93 % — ABNORMAL HIGH (ref 43–77)
Neutrophils Relative %: 95 % — ABNORMAL HIGH (ref 43–77)

## 2011-02-27 LAB — CULTURE, BLOOD (ROUTINE X 2)
Culture: NO GROWTH
Report Status: 9092010

## 2011-02-27 LAB — CARBOXYHEMOGLOBIN
O2 Saturation: 80.8 %
Total hemoglobin: 10.3 g/dL — ABNORMAL LOW (ref 12.5–16.0)

## 2011-02-27 LAB — APTT: aPTT: 27 seconds (ref 24–37)

## 2011-02-27 LAB — ABO/RH: ABO/RH(D): A POS

## 2011-02-27 LAB — PROTIME-INR
INR: 1.27 (ref 0.00–1.49)
Prothrombin Time: 16.1 seconds — ABNORMAL HIGH (ref 11.6–15.2)

## 2011-02-27 LAB — PROCALCITONIN: Procalcitonin: 0.7 ng/mL

## 2011-02-27 LAB — HEPATIC FUNCTION PANEL
ALT: 11 U/L (ref 0–35)
Albumin: 2.4 g/dL — ABNORMAL LOW (ref 3.5–5.2)
Total Protein: 5.8 g/dL — ABNORMAL LOW (ref 6.0–8.3)

## 2011-02-27 LAB — D-DIMER, QUANTITATIVE: D-Dimer, Quant: 1.01 ug/mL-FEU — ABNORMAL HIGH (ref 0.00–0.48)

## 2011-02-27 LAB — PHOSPHORUS: Phosphorus: 4.3 mg/dL (ref 2.3–4.6)

## 2011-02-27 LAB — BRAIN NATRIURETIC PEPTIDE: Pro B Natriuretic peptide (BNP): 109 pg/mL — ABNORMAL HIGH (ref 0.0–100.0)

## 2011-02-28 ENCOUNTER — Inpatient Hospital Stay (HOSPITAL_COMMUNITY): Payer: Medicaid Other

## 2011-02-28 LAB — CBC
Hemoglobin: 8.8 g/dL — ABNORMAL LOW (ref 12.0–15.0)
MCH: 24.8 pg — ABNORMAL LOW (ref 26.0–34.0)
MCV: 87.6 fL (ref 78.0–100.0)
Platelets: 319 10*3/uL (ref 150–400)
RBC: 3.55 MIL/uL — ABNORMAL LOW (ref 3.87–5.11)
WBC: 10.6 10*3/uL — ABNORMAL HIGH (ref 4.0–10.5)

## 2011-02-28 LAB — COMPREHENSIVE METABOLIC PANEL
AST: 13 U/L (ref 0–37)
Albumin: 2.3 g/dL — ABNORMAL LOW (ref 3.5–5.2)
Alkaline Phosphatase: 54 U/L (ref 39–117)
BUN: 16 mg/dL (ref 6–23)
CO2: 37 mEq/L — ABNORMAL HIGH (ref 19–32)
Chloride: 101 mEq/L (ref 96–112)
Creatinine, Ser: 0.49 mg/dL (ref 0.4–1.2)
GFR calc non Af Amer: >60 mL/min (ref >60)
Potassium: 4.6 mEq/L (ref 3.5–5.1)
Total Bilirubin: 0.2 mg/dL — ABNORMAL LOW (ref 0.3–1.2)

## 2011-02-28 LAB — GLUCOSE, CAPILLARY
Glucose-Capillary: 129 mg/dL — ABNORMAL HIGH (ref 70–99)
Glucose-Capillary: 155 mg/dL — ABNORMAL HIGH (ref 70–99)
Glucose-Capillary: 196 mg/dL — ABNORMAL HIGH (ref 70–99)

## 2011-02-28 LAB — POCT I-STAT 3, ART BLOOD GAS (G3+)
Acid-Base Excess: 11 mmol/L — ABNORMAL HIGH (ref 0.0–2.0)
Bicarbonate: 41.8 mEq/L — ABNORMAL HIGH (ref 20.0–24.0)
Bicarbonate: 44.7 mEq/L — ABNORMAL HIGH (ref 20.0–24.0)
O2 Saturation: 94 %
TCO2: 45 mmol/L (ref 0–100)
TCO2: 47 mmol/L (ref 0–100)
pCO2 arterial: 100.9 mmHg (ref 35.0–45.0)
pCO2 arterial: 78.2 mmHg (ref 35.0–45.0)
pH, Arterial: 7.225 — ABNORMAL LOW (ref 7.350–7.400)
pH, Arterial: 7.365 (ref 7.350–7.400)
pO2, Arterial: 89 mmHg (ref 80.0–100.0)
pO2, Arterial: 110 mmHg — ABNORMAL HIGH (ref 80.0–100.0)

## 2011-02-28 LAB — URINE CULTURE: Colony Count: NO GROWTH

## 2011-02-28 LAB — BRAIN NATRIURETIC PEPTIDE: Pro B Natriuretic peptide (BNP): 47 pg/mL (ref 0.0–100.0)

## 2011-03-01 ENCOUNTER — Inpatient Hospital Stay (HOSPITAL_COMMUNITY): Payer: Medicaid Other

## 2011-03-01 LAB — GLUCOSE, CAPILLARY
Glucose-Capillary: 154 mg/dL — ABNORMAL HIGH (ref 70–99)
Glucose-Capillary: 159 mg/dL — ABNORMAL HIGH (ref 70–99)
Glucose-Capillary: 167 mg/dL — ABNORMAL HIGH (ref 70–99)
Glucose-Capillary: 168 mg/dL — ABNORMAL HIGH (ref 70–99)

## 2011-03-01 LAB — BASIC METABOLIC PANEL
CO2: 38 mEq/L — ABNORMAL HIGH (ref 19–32)
Calcium: 9 mg/dL (ref 8.4–10.5)
GFR calc Af Amer: >60 mL/min (ref >60)
GFR calc non Af Amer: >60 mL/min (ref >60)
Sodium: 144 mEq/L (ref 135–145)

## 2011-03-01 LAB — CULTURE, RESPIRATORY W GRAM STAIN

## 2011-03-01 LAB — POCT I-STAT 3, ART BLOOD GAS (G3+): pH, Arterial: 7.393 (ref 7.350–7.400)

## 2011-03-01 LAB — DIFFERENTIAL
Basophils Absolute: 0 10*3/uL (ref 0.0–0.1)
Basophils Relative: 0 % (ref 0–1)
Monocytes Absolute: 0.7 10*3/uL (ref 0.1–1.0)
Neutro Abs: 9.3 10*3/uL — ABNORMAL HIGH (ref 1.7–7.7)

## 2011-03-01 LAB — CULTURE, BAL-QUANTITATIVE W GRAM STAIN
Colony Count: NO GROWTH
Culture: NO GROWTH

## 2011-03-01 LAB — CBC
Hemoglobin: 9.6 g/dL — ABNORMAL LOW (ref 12.0–15.0)
MCHC: 28.6 g/dL — ABNORMAL LOW (ref 30.0–36.0)
RDW: 17 % — ABNORMAL HIGH (ref 11.5–15.5)
WBC: 10.9 10*3/uL — ABNORMAL HIGH (ref 4.0–10.5)

## 2011-03-02 ENCOUNTER — Inpatient Hospital Stay (HOSPITAL_COMMUNITY): Payer: Medicaid Other

## 2011-03-02 DIAGNOSIS — J441 Chronic obstructive pulmonary disease with (acute) exacerbation: Secondary | ICD-10-CM

## 2011-03-02 DIAGNOSIS — J9600 Acute respiratory failure, unspecified whether with hypoxia or hypercapnia: Secondary | ICD-10-CM

## 2011-03-02 DIAGNOSIS — J189 Pneumonia, unspecified organism: Secondary | ICD-10-CM

## 2011-03-02 DIAGNOSIS — R579 Shock, unspecified: Secondary | ICD-10-CM

## 2011-03-02 LAB — POCT I-STAT 3, ART BLOOD GAS (G3+)
Bicarbonate: 43 mEq/L — ABNORMAL HIGH (ref 20.0–24.0)
O2 Saturation: 96 %
TCO2: 45 mmol/L (ref 0–100)
pCO2 arterial: 68.1 mmHg (ref 35.0–45.0)
pO2, Arterial: 87 mmHg (ref 80.0–100.0)

## 2011-03-02 LAB — GLUCOSE, CAPILLARY
Glucose-Capillary: 145 mg/dL — ABNORMAL HIGH (ref 70–99)
Glucose-Capillary: 142 mg/dL — ABNORMAL HIGH (ref 70–99)
Glucose-Capillary: 123 mg/dL — ABNORMAL HIGH (ref 70–99)
Glucose-Capillary: 129 mg/dL — ABNORMAL HIGH (ref 70–99)
Glucose-Capillary: 157 mg/dL — ABNORMAL HIGH (ref 70–99)
Glucose-Capillary: 144 mg/dL — ABNORMAL HIGH (ref 70–99)
Glucose-Capillary: 138 mg/dL — ABNORMAL HIGH (ref 70–99)

## 2011-03-02 LAB — BASIC METABOLIC PANEL
CO2: 38 mEq/L — ABNORMAL HIGH (ref 19–32)
Chloride: 100 mEq/L (ref 96–112)
Creatinine, Ser: 0.6 mg/dL (ref 0.4–1.2)
GFR calc Af Amer: >60 mL/min (ref >60)
Potassium: 3.2 mEq/L — ABNORMAL LOW (ref 3.5–5.1)

## 2011-03-02 LAB — CBC
HCT: 36.4 % (ref 36.0–46.0)
MCH: 24.6 pg — ABNORMAL LOW (ref 26.0–34.0)
Platelets: 372 10*3/uL (ref 150–400)

## 2011-03-03 ENCOUNTER — Inpatient Hospital Stay (HOSPITAL_COMMUNITY): Payer: Medicaid Other

## 2011-03-03 LAB — GLUCOSE, CAPILLARY
Glucose-Capillary: 101 mg/dL — ABNORMAL HIGH (ref 70–99)
Glucose-Capillary: 135 mg/dL — ABNORMAL HIGH (ref 70–99)
Glucose-Capillary: 141 mg/dL — ABNORMAL HIGH (ref 70–99)
Glucose-Capillary: 143 mg/dL — ABNORMAL HIGH (ref 70–99)

## 2011-03-03 LAB — CBC
Hemoglobin: 11.2 g/dL — ABNORMAL LOW (ref 12.0–15.0)
MCH: 25.2 pg — ABNORMAL LOW (ref 26.0–34.0)
MCHC: 31.1 g/dL (ref 30.0–36.0)
Platelets: 382 10*3/uL (ref 150–400)
RDW: 16.2 % — ABNORMAL HIGH (ref 11.5–15.5)

## 2011-03-03 LAB — BASIC METABOLIC PANEL
Calcium: 9.5 mg/dL (ref 8.4–10.5)
Creatinine, Ser: 0.71 mg/dL (ref 0.4–1.2)
GFR calc Af Amer: >60 mL/min (ref >60)
GFR calc non Af Amer: >60 mL/min (ref >60)
Sodium: 144 mEq/L (ref 135–145)

## 2011-03-03 LAB — PHOSPHORUS: Phosphorus: 4 mg/dL (ref 2.3–4.6)

## 2011-03-03 LAB — MAGNESIUM: Magnesium: 2.2 mg/dL (ref 1.5–2.5)

## 2011-03-04 ENCOUNTER — Inpatient Hospital Stay (HOSPITAL_COMMUNITY): Payer: Medicaid Other

## 2011-03-04 LAB — GLUCOSE, CAPILLARY
Glucose-Capillary: 155 mg/dL — ABNORMAL HIGH (ref 70–99)
Glucose-Capillary: 289 mg/dL — ABNORMAL HIGH (ref 70–99)
Glucose-Capillary: 256 mg/dL — ABNORMAL HIGH (ref 70–99)
Glucose-Capillary: 161 mg/dL — ABNORMAL HIGH (ref 70–99)

## 2011-03-04 LAB — BASIC METABOLIC PANEL
Chloride: 92 mEq/L — ABNORMAL LOW (ref 96–112)
Creatinine, Ser: 0.62 mg/dL (ref 0.4–1.2)
GFR calc Af Amer: >60 mL/min (ref >60)
GFR calc non Af Amer: >60 mL/min (ref >60)
Potassium: 3 mEq/L — ABNORMAL LOW (ref 3.5–5.1)

## 2011-03-04 LAB — CBC
MCV: 78.5 fL (ref 78.0–100.0)
Platelets: 430 10*3/uL — ABNORMAL HIGH (ref 150–400)
RBC: 5.54 MIL/uL — ABNORMAL HIGH (ref 3.87–5.11)
WBC: 14.6 10*3/uL — ABNORMAL HIGH (ref 4.0–10.5)

## 2011-03-04 LAB — CULTURE, BLOOD (ROUTINE X 2)

## 2011-03-04 LAB — OPIATE, QUANTITATIVE, URINE
Hydrocodone: NEGATIVE NG/ML
Morphine, Confirm: 1303 NG/ML — ABNORMAL HIGH

## 2011-03-05 LAB — DRUGS OF ABUSE SCREEN W/O ALC, ROUTINE URINE
Cocaine Metabolites: NEGATIVE
Propoxyphene: NEGATIVE

## 2011-03-05 LAB — BASIC METABOLIC PANEL
Calcium: 8.6 mg/dL (ref 8.4–10.5)
Creatinine, Ser: 0.67 mg/dL (ref 0.4–1.2)
GFR calc Af Amer: >60 mL/min (ref >60)
GFR calc non Af Amer: >60 mL/min (ref >60)
Sodium: 136 mEq/L (ref 135–145)

## 2011-03-05 LAB — CULTURE, BLOOD (ROUTINE X 2)
Culture  Setup Time: 201204060512
Culture: NO GROWTH

## 2011-03-05 LAB — CBC
MCH: 24.8 pg — ABNORMAL LOW (ref 26.0–34.0)
MCHC: 31.3 g/dL (ref 30.0–36.0)
Platelets: 401 10*3/uL — ABNORMAL HIGH (ref 150–400)
RDW: 16.2 % — ABNORMAL HIGH (ref 11.5–15.5)

## 2011-03-05 LAB — GLUCOSE, CAPILLARY
Glucose-Capillary: 152 mg/dL — ABNORMAL HIGH (ref 70–99)
Glucose-Capillary: 129 mg/dL — ABNORMAL HIGH (ref 70–99)

## 2011-03-05 LAB — PHOSPHORUS: Phosphorus: 3.3 mg/dL (ref 2.3–4.6)

## 2011-03-05 IMAGING — CR DG CHEST 2V
2 series · 2 of 2 positions shown · non-contrast
Comparison: 12/28/2009

CLINICAL DATA: Recent hospitalization for pneumonia. Productive
cough.

CHEST - 2 VIEW

[view not recorded (1 of 2)]
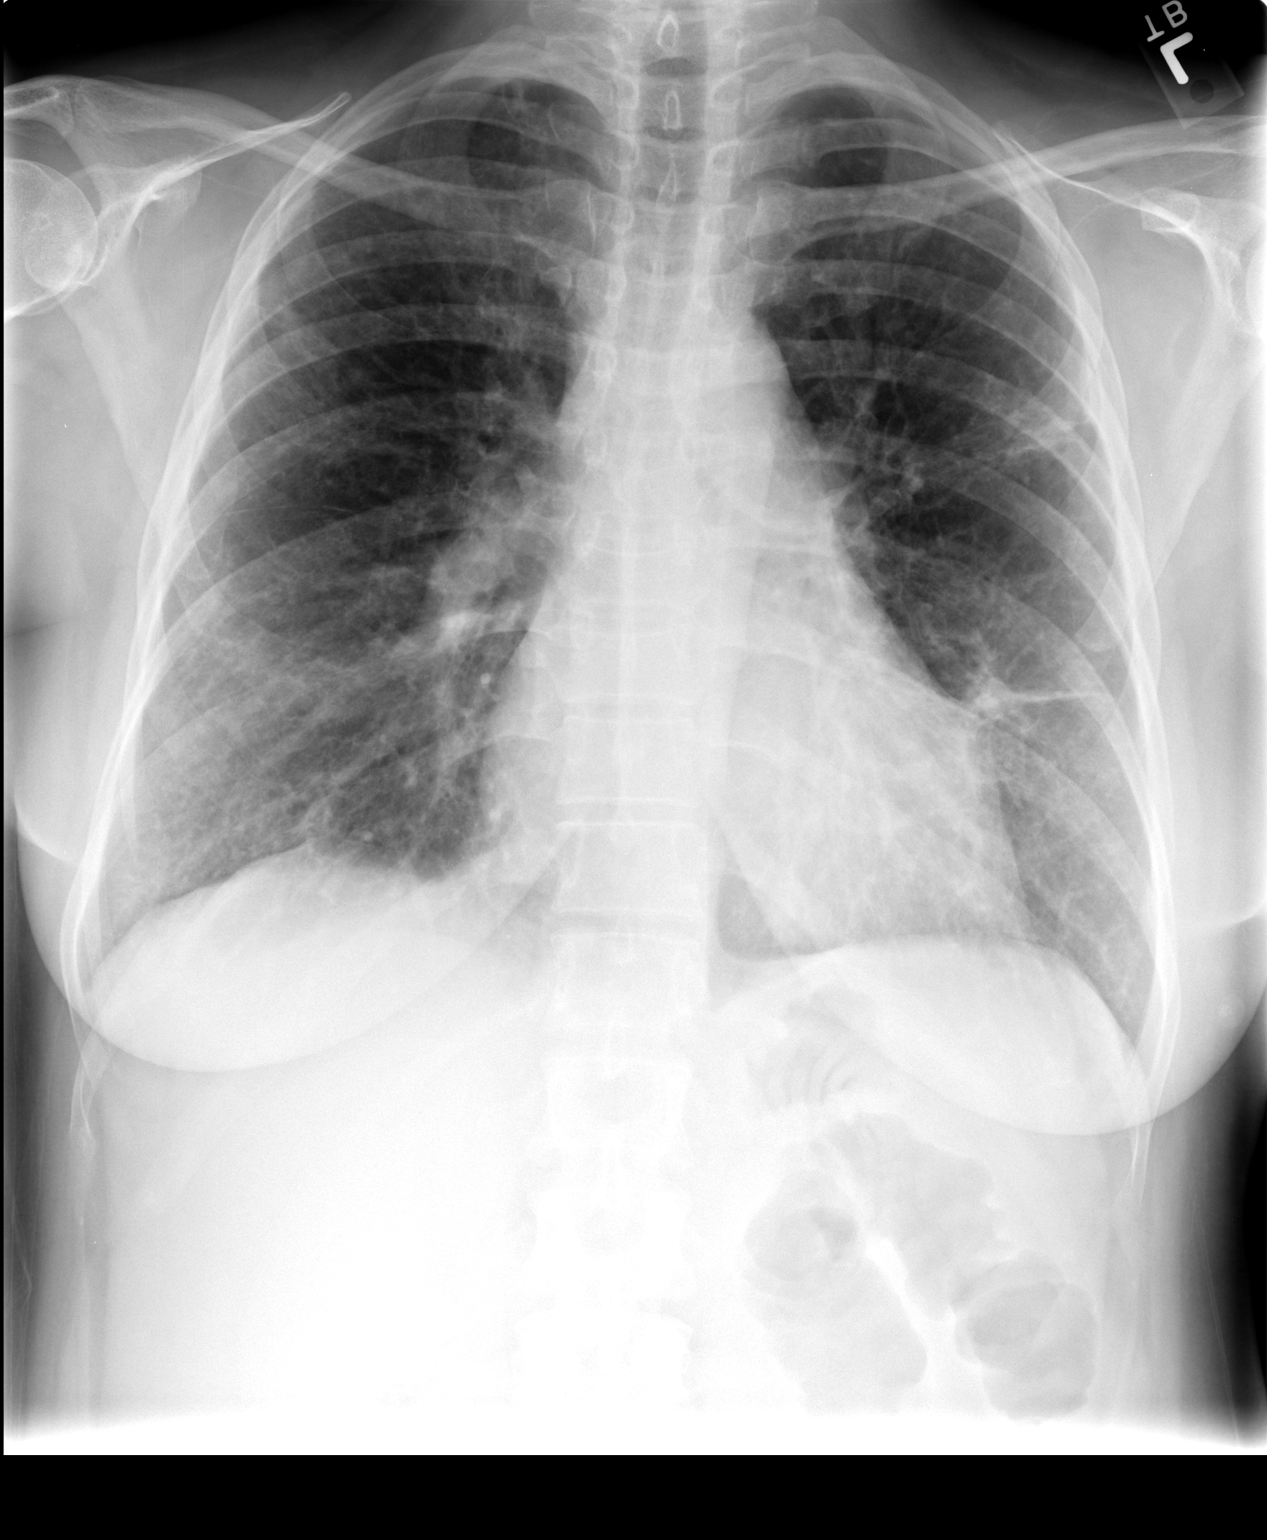

[view not recorded (2 of 2)]
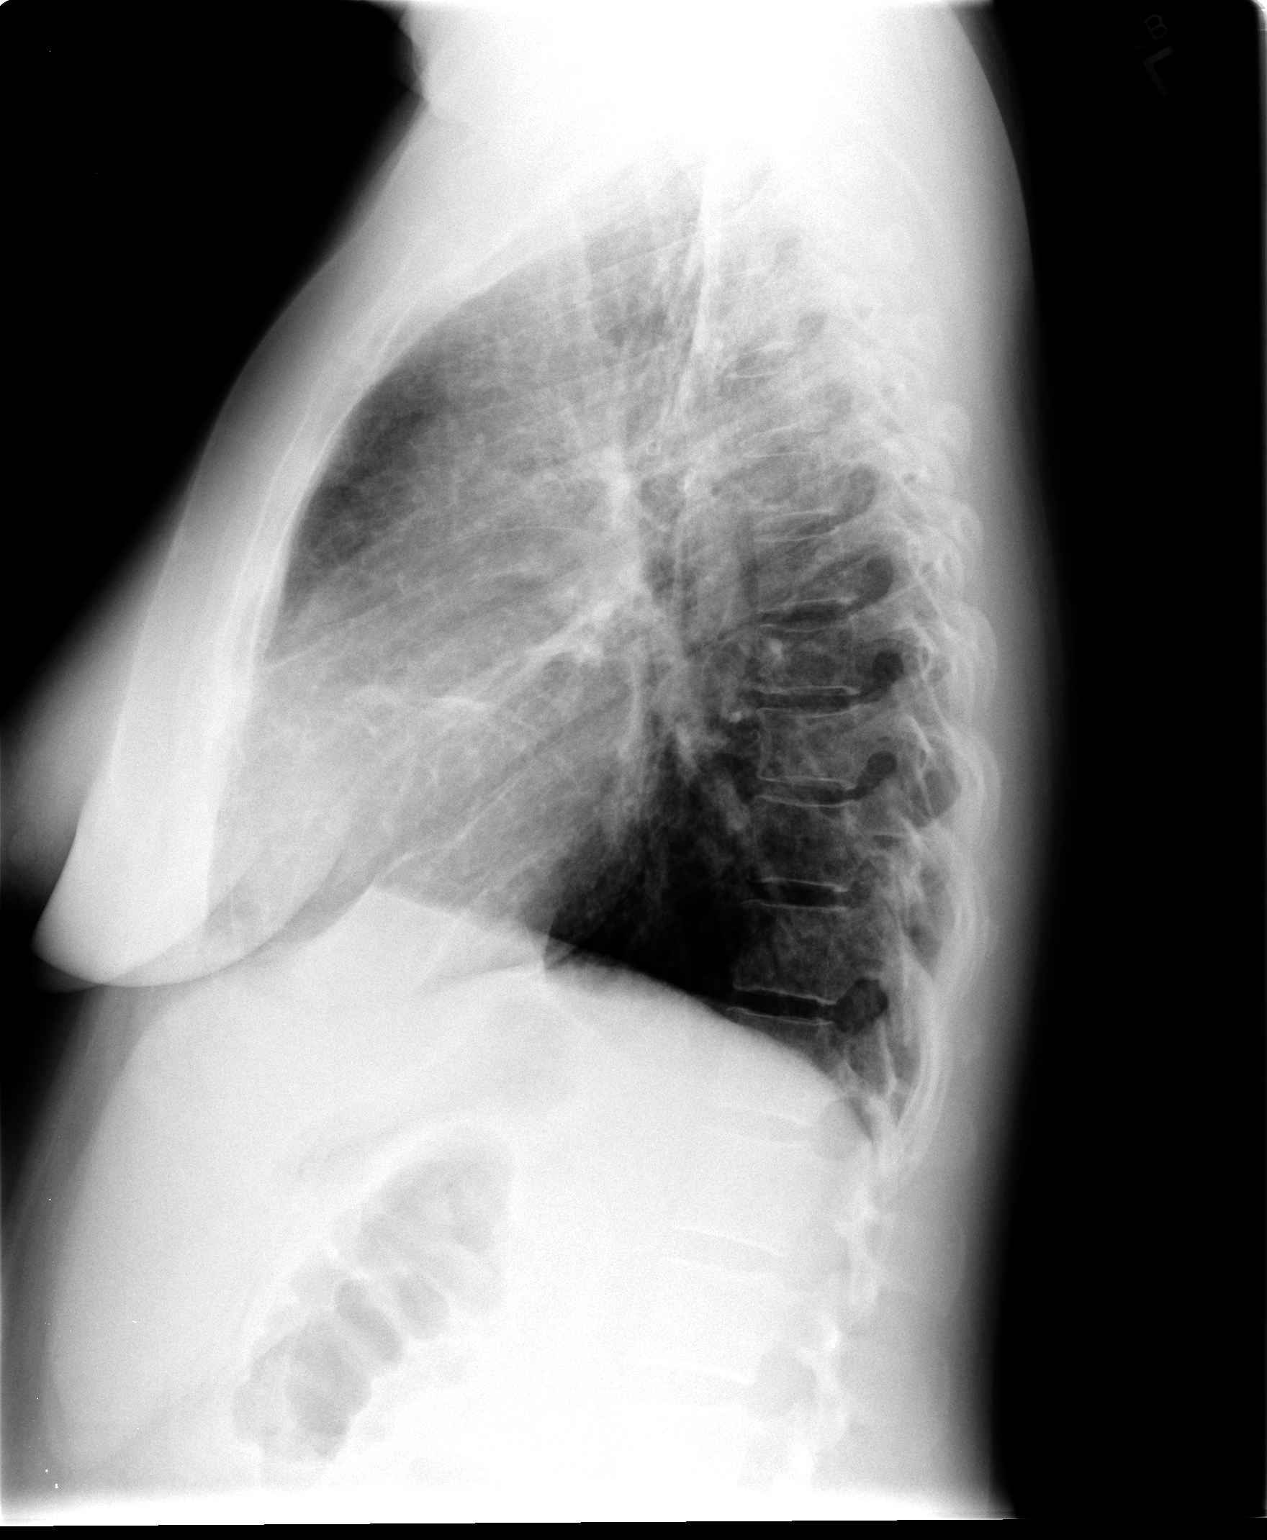

[2 of 2 positions shown; findings below may reference images not displayed]

FINDINGS: Chronic peribronchial inflammatory changes.  Linear
densities in the left upper and lower lung zones are compatible
with subsegmental atelectasis or scarring.  No airspace densities.
No pleural effusions.  Bony thorax intact.
IMPRESSION: Chronic bronchitic changes.  COPD.  Linear scarring versus
subsegmental atelectasis on the left.

## 2011-03-06 LAB — BASIC METABOLIC PANEL
BUN: 10 mg/dL (ref 6–23)
CO2: 34 mEq/L — ABNORMAL HIGH (ref 19–32)
Chloride: 95 mEq/L — ABNORMAL LOW (ref 96–112)
Creatinine, Ser: 0.67 mg/dL (ref 0.4–1.2)
Potassium: 4.7 mEq/L (ref 3.5–5.1)

## 2011-03-06 LAB — GLUCOSE, CAPILLARY: Glucose-Capillary: 152 mg/dL — ABNORMAL HIGH (ref 70–99)

## 2011-03-08 ENCOUNTER — Encounter: Payer: Self-pay | Admitting: Emergency Medicine

## 2011-03-09 NOTE — Discharge Summary (Addendum)
[name], [name]                 ACCOUNT NO.:  [id number]  MEDICAL RECORD NO.:  [id number]           PATIENT TYPE:  I  LOCATION:  2032                         FACILITY:  [facility]  PHYSICIAN:  Felipa Evener, MD  DATE OF BIRTH:  September 05, 1966  DATE OF ADMISSION:  02/26/2011 DATE OF DISCHARGE:  03/06/2011                              DISCHARGE SUMMARY   DISCHARGE DIAGNOSES: 1. Acute exacerbation of chronic obstructive pulmonary disease. 2. Acute on chronic respiratory failure secondary to #1. 3. Healthcare-acquired pneumonia. 4. Septic shock secondary to #3. 5. Bipolar disorder. 6. Hypokalemia. 7. Steroid-induced hyperglycemia.  LINES/TUBES: 1. Oral endotracheal tube from February 26, 2011, to February 28, 2011, at     which time she self-extubated and required reintubation and was     self-extubated again on March 01, 2011. 2. Right IJ central line from February 26, 2011, to March 02, 2011.  ANTIBIOTIC DATA: 1. The patient was placed on Levaquin from February 26, 2011, to March 04, 2011, for HCAP. 2. Vancomycin from February 26, 2011, to March 02, 2011, for HCAP.  MICROBIOLOGY DATA: 1. February 26, 2011, blood cultures x2 demonstrate no growth.  Second set     of blood cultures on February 26, 2011, x2 demonstrate no growth. 2. February 26, 2011, urine Legionella is negative. 3. February 26, 2011 urine culture is negative. 4. February 26, 2011, respiratory culture is negative. 5. February 27, 2011, BAL is negative. 6. February 26, 2011, procalcitonin 0.28.  BEST PRACTICE:  The patient was placed on heparin for DVT prophylaxis and Protonix for stress ulcer prevention.  KEY EVENTS/STUDIES: 1. February 26, 2011, urine drug screen is positive for marijuana, benzos,     and opiates. 2. February 26, 2011, HIV is negative.  LABORATORY DATA:  March 06, 2011, BMP demonstrates sodium 136, potassium 4.7, chloride 95, CO2 34, glucose 148, BUN 10, creatinine 0.67, calcium 9.0.  RADIOLOGIC DATA: 1. February 26, 2011, chest x-ray  demonstrates right greater than left     nodular airspace disease with basilar predominant. 2. March 04, 2011, chest x-ray demonstrates no acute infiltrate with     mild hyperinflation.  HISTORY OF PRESENT ILLNESS:  [name] is a 45 year old white female with past medical history of O2-dependent COPD, continued tobacco abuse, bipolar disorder, opiate abuse, and frequent hospitalizations for acute exacerbation of COPD with multiple intubations and tracheostomy in the past.  She was transferred from [facility] status post intubation to [facility] on February 26, 2011.  Her history indicated she complained of progressive worsening shortness of breath with cough and yellow sputum.  It began several days prior to presentation.  She was placed in the intensive care unit and placed on IV Solu-Medrol, nebulized bronchodilators, and central line was placed out of concern for sepsis.  She was placed on aggressive IV antibiotics for probable HCAP.  She was maintained on mechanical ventilation until February 28, 2011, at which time she self-extubated and required reintubation.  She did remain intubated until March 01, 2011.  At which time, she was successfully liberated from mechanical ventilation and  required no further pulmonary interventions.  She transiently required norepinephrine to achieve normal blood pressure in the setting of septic shock.  She did have steroid-induced hyperglycemia during hospital course, for which she was placed on sliding scale insulin to achieve normal glucose control.  Antibiotics were narrowed and tapered as culture data returned.  Cultures thus far throughout hospital course have been negative.  Post-extubation, she was evaluated by Speech Pathology.  She was also evaluated by Physical Therapy during hospital course and at the time of discharge, no identifiable home PT needs are noted.  She did pass a swallow evaluation per Speech Therapy.  HOSPITAL COURSE BY  DISCHARGE DIAGNOSES: 1. Acute exacerbation of COPD.  As per HPI, [name] did present to     [facility] Emergency Department with 2-day history of worsening     shortness of breath, cough with productive sputum.  She did require     intubation and was transferred to [facility] for further care.  She     was placed on nebulized bronchodilators, IV steroids, and was     maintained on mechanical ventilation in the setting of HCAP. 2. Acute on chronic respiratory failure secondary to #1.  [name] is     a known chronically O2-dependent COPD patient that has had frequent     hospitalizations secondary to respiratory failure, requiring     intubation and previous tracheostomy.  This admission, she was     intubated from February 26, 2011, until March 01, 2011.  She did self-     extubate 2 times during hospital course.  Post-extubation on March 01, 2011, she required no further pulmonary interventions. 3. HCAP.  Initial chest x-rays did demonstrate right greater than left     nodular airspace disease with basilar predominant.  She was treated     for hospital-acquired pneumonia.  She was placed on IV antibiotics     as above.  She was pancultured and cultures to date were negative.     She did complete IV antibiotic therapy during hospitalization. 4. Septic shock secondary to #3.  Unfortunately, [name] did     experience septic shock, requiring vasopressors during hospital     course to achieve normal mean arterial pressures.  She was     transiently on norepinephrine during intensive care stay.  This was     quickly weaned off and she made significant improvement during     hospital course with therapy. 5. History of bipolar disorder.  Her home medications were continued     during hospitalization.  No changes to current regimen. 6. Hypokalemia.  [name] transiently did have hypokalemia during     hospitalization.  This was monitored and treated as needed.  At the     time of discharge,  she will continue on her previous home Lasix and     potassium dosing. 7. Steroid-induced hyperglycemia.  She was placed on sliding scale     insulin during hospitalization to achieve normal glucose control.     At the time of discharge, she will be continued on prednisone taper     for acute exacerbation of COPD.  It was felt that as steroids are     tapered, her blood sugar should return to within normal limits.  No     sliding scale coverage will be given at the time of discharge.  DISCHARGE INSTRUCTIONS: 1. Activity.  She was instructed to increase activity slowly  and as     tolerated. 2. Diet.  Heart-healthy 3. Followup appointments.  She is scheduled to follow up with Rubye Oaks on March 19, 2011, at 10 a.m. at [facility].  DISCHARGE MEDICATIONS: 1. Prednisone 10-mg tabs, 4 tabs daily for 3 days, then 3 tabs daily     for 3 days, then 2 tabs daily for 3 days, then 1 tab daily for 3     days, and stop. 2. Abilify 15 mg by mouth daily. 3. Advair Diskus 250/50 one puff inhaled every 12 hours. 4. Effexor XR 150-mg tab by mouth daily. 5. Furosemide 20 mg by mouth daily. 6. Glyburide/metformin 5/500 mg 1 tablet by mouth twice daily. 7. Oxycodone/acetaminophen 5/325 one tablet by mouth every 4 hours as     needed for pain.  This was a previous home medication for her and     no prescription for narcotics will be given at the time of     discharge. 8. Potassium 20 mEq 1 tablet by mouth twice daily. 9. Spiriva 18 mcg 1 capsule inhaled daily. 10.Tramadol 50 mg 2 tablets by mouth daily at bedtime. 11.Ventolin inhaler 2 puffs inhaled every 4 hours as needed for     shortness of breath.  DISPOSITION AT THE TIME OF DISCHARGE:  [name] has met maximum benefit of inpatient therapy and is currently medically stable and cleared for discharge pending followup as above.     Canary Brim, NP   ______________________________ Felipa Evener, MD    BO/MEDQ  D:   03/06/2011  T:  03/06/2011  Job:  [id number]  cc:   Rubye Oaks, NP  Electronically Signed by Canary Brim  on 03/09/2011 04:40:42 PM Electronically Signed by Koren Bound MD on 03/16/2011 10:46:40 AM

## 2011-03-12 ENCOUNTER — Telehealth: Payer: Self-pay | Admitting: Emergency Medicine

## 2011-03-12 NOTE — Telephone Encounter (Signed)
PT CALLED BACK AGAIN- [facility]YS DR[facility]S OFFICE HAS NOT RECEIVED FAX YET- NEEDS RE-FAXED. I VERIFIED FAX #- IT IS CORRECT PER PT. [name]

## 2011-03-12 NOTE — Telephone Encounter (Signed)
Letter has been dictated by RB and I have faxed to Dr. Gerlene Fee at [phone]. Pt is aware.

## 2011-03-12 NOTE — Telephone Encounter (Signed)
Pt calling back stating she does not need letter sent to Dr. Gerlene Fee for pain medication, but is needing letter sent for surgery clearance.

## 2011-03-12 NOTE — Telephone Encounter (Signed)
Pt aware we faxed letter this morning to Dr. Gerlene Fee but will refax to the number provided above.

## 2011-03-16 ENCOUNTER — Telehealth: Payer: Self-pay | Admitting: Emergency Medicine

## 2011-03-16 NOTE — Telephone Encounter (Signed)
Patient phoned stated that she was returning a call to triage she can be reached at [phone].[name]

## 2011-03-16 NOTE — Telephone Encounter (Signed)
Called pt and she states she spoke w/ [facility]je[facility] office and they have received fax. Nothing further was needed

## 2011-03-16 NOTE — Telephone Encounter (Signed)
lmomtcb x 1. I faxed the letter twice last week. Will refax again to [phone].

## 2011-03-17 ENCOUNTER — Encounter: Payer: Self-pay | Admitting: Adult Health

## 2011-03-17 IMAGING — CT CT ABD-PELV W/ CM
2 of 3 series · 16 of 46 positions shown, 18 images · IV contrast (agent unspecified)
Comparison: 07/25/2006

CLINICAL DATA: Lower abdominal pain, nausea, vomiting, diarrhea.

CT ABDOMEN AND PELVIS WITH CONTRAST
TECHNIQUE: Multidetector CT imaging of the abdomen and pelvis was
performed following the standard protocol during bolus
administration of intravenous contrast.
Contrast: 100 ml Tmnipaque-299

[Series 2: abd_pel_with 5.0 b40f · axial · 0.59mm/px · z∈[-658,-323]mm · 13 of 77 slices shown, 15 images]
[im 5/77  soft-tissue]
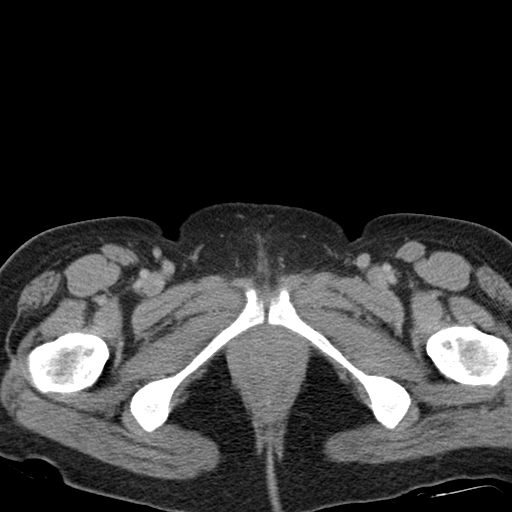
[im 5/77  bone]
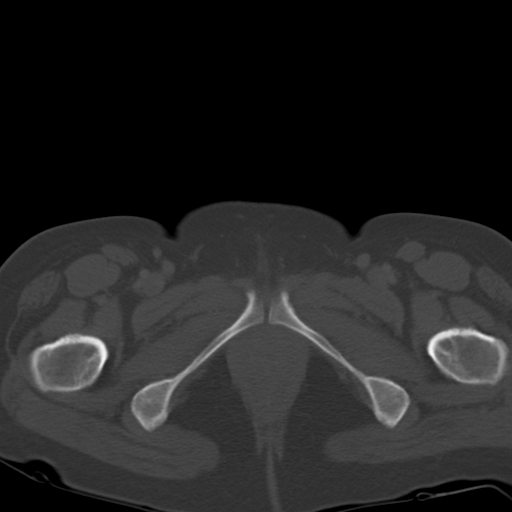
[im 10/77  soft-tissue]
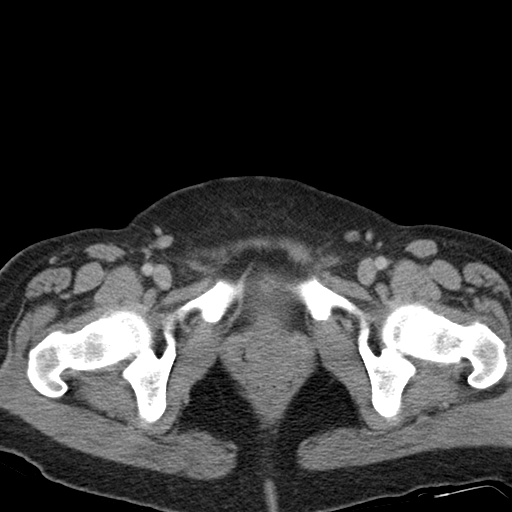
[im 15/77  soft-tissue]
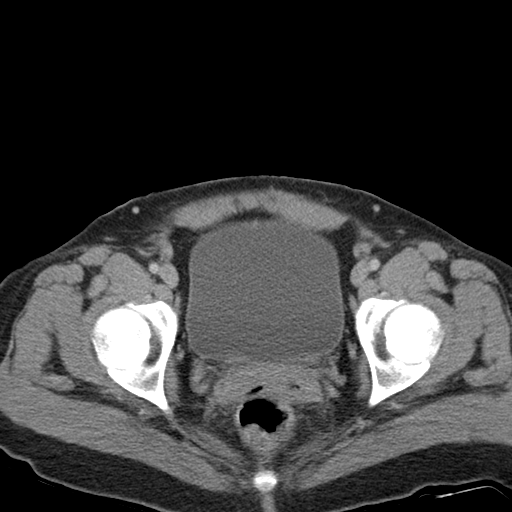
[im 23/77  soft-tissue]
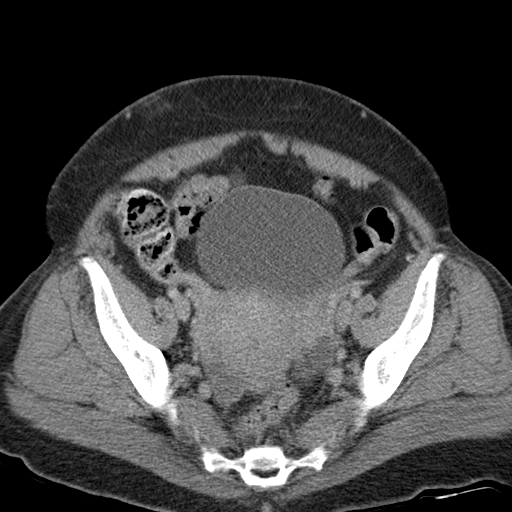
[im 27/77  soft-tissue]
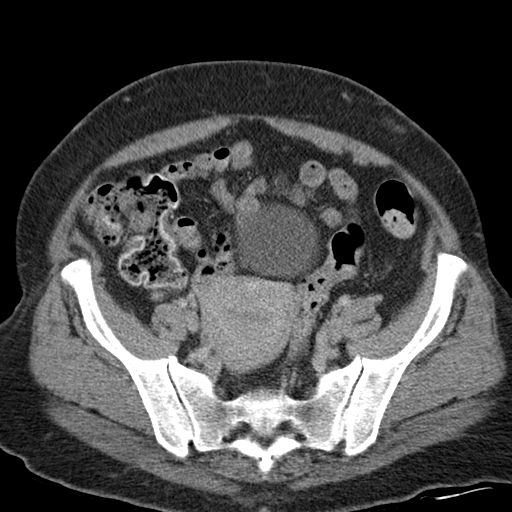
[im 32/77  soft-tissue]
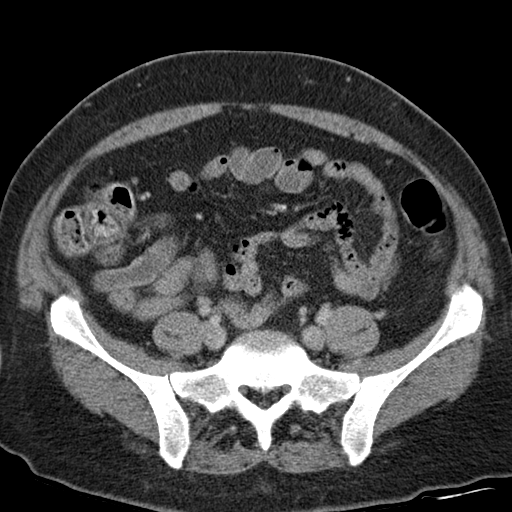
[im 40/77  soft-tissue]
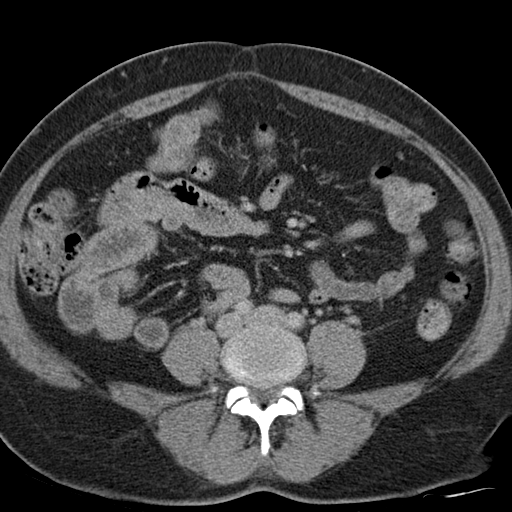
[im 45/77  soft-tissue]
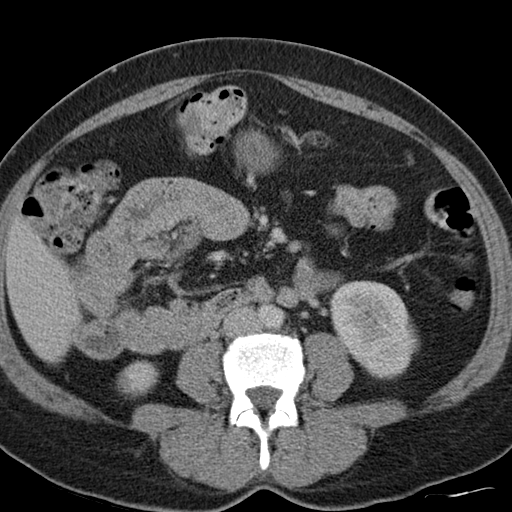
[im 50/77  soft-tissue]
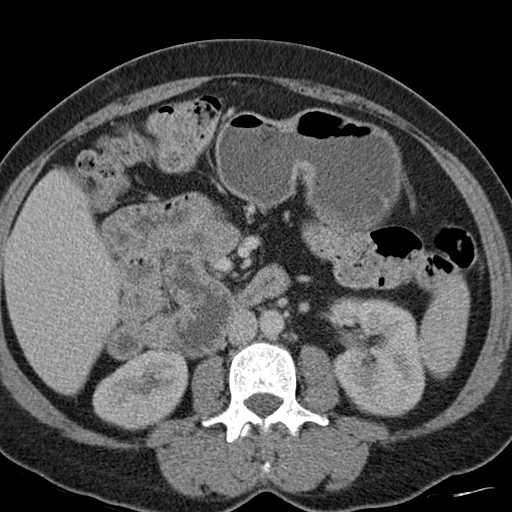
[im 50/77  bone]
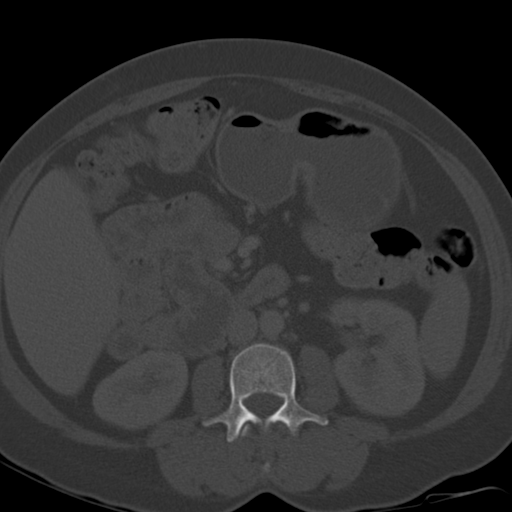
[im 54/77  soft-tissue]
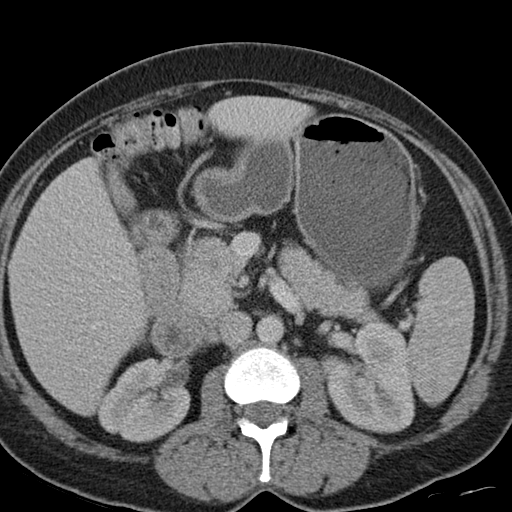
[im 62/77  soft-tissue]
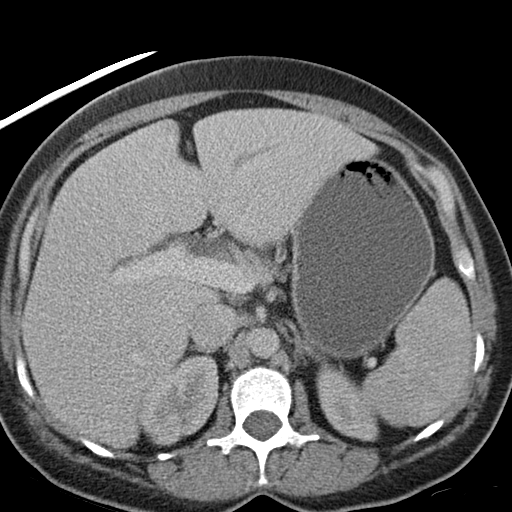
[im 67/77  soft-tissue]
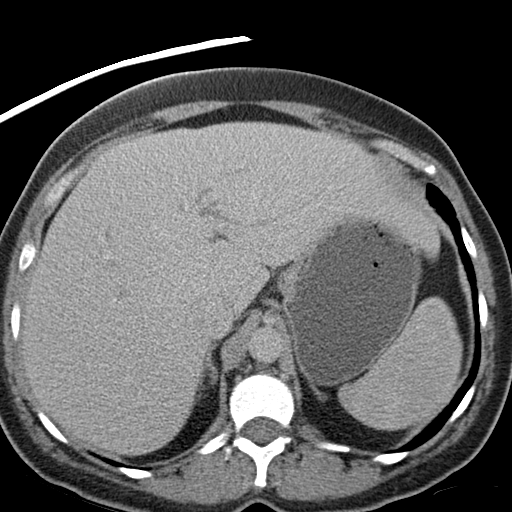
[im 72/77  soft-tissue]
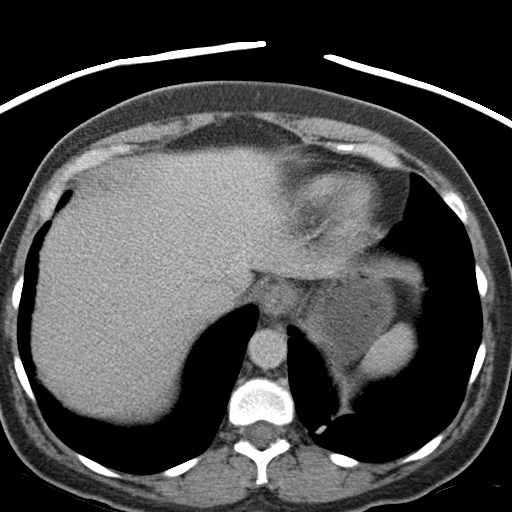

[Series 4: mpr cor post contrast (id) · coronal · 0.62mm/px · 3 of 92 slices shown]
[im 31/92  soft-tissue]
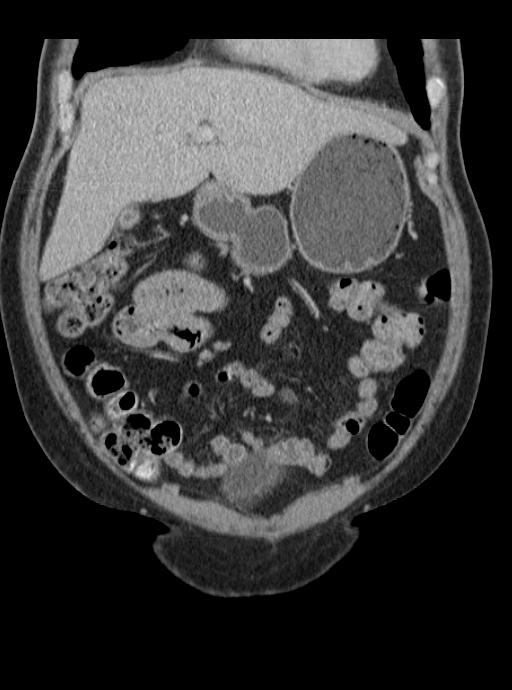
[im 41/92  soft-tissue]
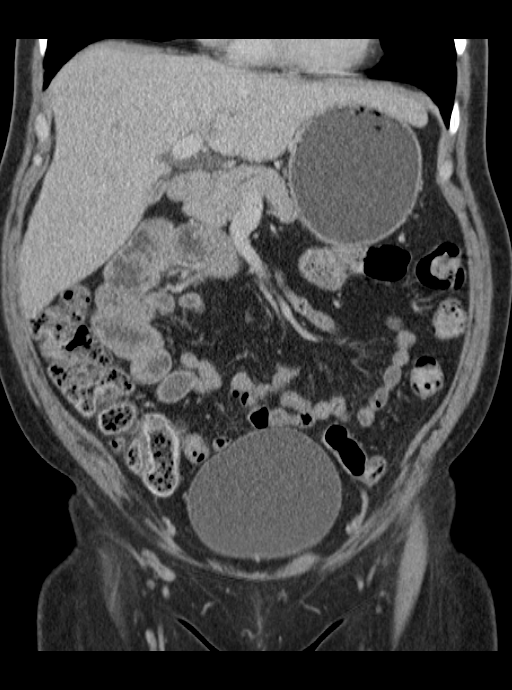
[im 51/92  soft-tissue]
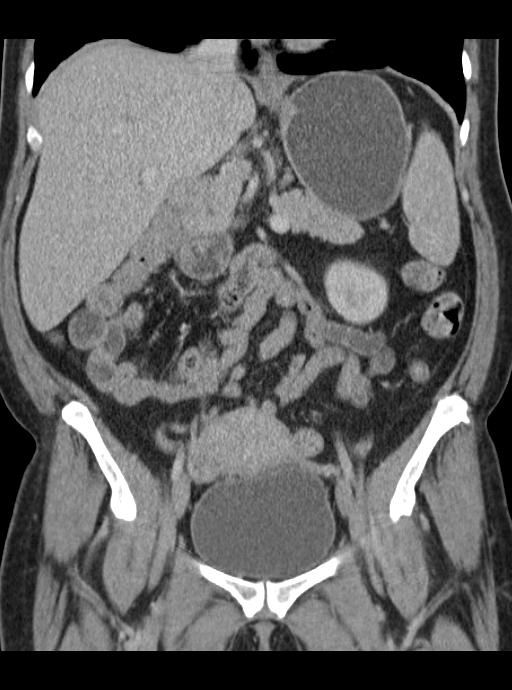

[16 of 46 positions shown; findings below may reference images not displayed]

FINDINGS: Minimal patchy basilar densities suspicious for sub
segmental atelectasis.  Normal heart size.  No pericardial or
pleural effusion.  No hiatal hernia.

Abdomen:  Liver demonstrates mild diffuse intrahepatic biliary
prominence.  There is slight prominence of the common bile duct.
This extends within the pancreatic head.  Pancreatic duct is
nondilated.  Gallbladder is collapsed.  Appearance of the biliary
system is nonspecific.  Recommend correlation with liver function
test.

No focal hepatic abnormality.  Hepatic and portal veins are patent.
Adrenal glands, kidneys, spleen, and pancreas are within normal
limits.  No abdominal free fluid, fluid collection, ascites,
hemorrhage, or abscess.  No bowel obstruction pattern, dilatation,
ileus.  No free air. Duodenum is noted to remain in the right upper
quadrant without crossing midline.  Several small bowel loops
extend in the right abdomen.  No evidence of obstruction.  Normal
colon position.  Appearance is consistent with small bowel
nonrotation.  This has a stable appearance compared to the prior
study.

Pelvis:  Normal appearing appendix.  Prominent ovarian follicles
bilaterally.  Urinary bladder is moderately distended.  No pelvic
free fluid, fluid collection, hemorrhage, adenopathy, or inguinal
hernia.  No acute distal bowel process

No acute osseous finding.  No compression fracture.
IMPRESSION: Mild diffuse biliary prominence, nonspecific as described.

Nonrotation of the small bowel, stable and without obstruction.

## 2011-03-19 ENCOUNTER — Encounter: Payer: Self-pay | Admitting: Adult Health

## 2011-03-19 ENCOUNTER — Ambulatory Visit (INDEPENDENT_AMBULATORY_CARE_PROVIDER_SITE_OTHER): Payer: Medicaid Other | Admitting: Adult Health

## 2011-03-19 ENCOUNTER — Telehealth: Payer: Self-pay | Admitting: Adult Health

## 2011-03-19 DIAGNOSIS — J984 Other disorders of lung: Secondary | ICD-10-CM

## 2011-03-19 DIAGNOSIS — J449 Chronic obstructive pulmonary disease, unspecified: Secondary | ICD-10-CM

## 2011-03-19 MED ORDER — ALBUTEROL SULFATE (2.5 MG/3ML) 0.083% IN NEBU: INHALATION_SOLUTION | RESPIRATORY_TRACT | Status: DC

## 2011-03-19 MED ORDER — ALBUTEROL SULFATE HFA 108 (90 BASE) MCG/ACT IN AERS: 2.0000 | INHALATION_SPRAY | Freq: Four times a day (QID) | RESPIRATORY_TRACT | Status: DC | PRN

## 2011-03-19 MED ORDER — FLUTICASONE-SALMETEROL 250-50 MCG/DOSE IN AEPB: 1.0000 | INHALATION_SPRAY | Freq: Two times a day (BID) | RESPIRATORY_TRACT | Status: DC

## 2011-03-19 MED ORDER — TIOTROPIUM BROMIDE MONOHYDRATE 18 MCG IN CAPS: 18.0000 ug | ORAL_CAPSULE | Freq: Every day | RESPIRATORY_TRACT | Status: DC

## 2011-03-19 NOTE — Patient Instructions (Signed)
Most important is to stop smoking.  Continue on Advair and Spiriva.  Continue on O2 at 2 l/m  Continuously follow up .Dr. Delton Coombes in 3 weeks and As needed

## 2011-03-19 NOTE — Telephone Encounter (Signed)
Spoke w/ family member and pt was asleep. Advised family member pt rx's was sent to pharmacy and they states they will inform her

## 2011-03-19 NOTE — Progress Notes (Signed)
Subjective:    Patient ID: [name], female    DOB: 01/19/1966, 45 y.o.   MRN: [id number]  HPI 45 yo woman, heavy smoker, treated 1-2/11 for necrotic PNA and ARDS, resp failure. Also w hx substance abuse, narcotics use for back pain and Bipolar disorder  11/2009- Follow up-- /C from hosp to SNF, but left AMA. Has been living with her mom. Taking percocet 15mg  1 -2 x a day (off the streets). Tells me that she is slowly recovering but feels she may be at a plateau. She can't stop smoking. Has freq cough with clear sputum. breathing OK. She does have BD's that she was on prior to her hospitalization. Advair + Spiriva, as needed ventolin.   ROV 04/16/10 -- f/u for COPD, admission for necrotic PNA and ARDS (s/p trach, decanulated). Still smoking 2 pk/day. Taking her BDs reliably. Having trouble with memory loss. Hasn't used THC since May. No exacerbations since last visit. Hasn't had PFT yet. Activity level has improved.   ROV 07/22/10 -- COPD and tobacco. Has cut down to 1 pk/day. Still using narcs off the street occas but planning for MRI and pain clinic appointment in [facility] She feels better, breathing is better, has been walking every day. Advair + Spiriva.   ROV 09/01/10 -- tobacco abuse and COPD. She began to have increased cough, dark sputum since mid September. Was treated with Pred last Friday for her back pain, ? allergic rxn to neurontin, finishing prednisone in 2 days. Quit going to Pain clinic. Asking me for meds for anxiety.   ROV 09/12/10 -- COPD and tobacco, still using 1pk/day. last time treated for AE/bronchitis with Avelox (she was already on pred). Feels better, mucous is improved. Still has basline wheeze.   03/19/2011 Hospital follow up  PT returns today for hospital follow up. She was admitted 4/5-4/11/12 for AECOPD, Acute on chronic resp failure and Pneumonia-Hospital associated with septic shock. She did require vent support /5-4/7 (self extubated). Tx with broad  spectrum abx., IV steroids, and nebs. CXR showed right >left aspdz (basilar). Pan cultures were neg. Initially required pressor support and fluid challenge with good response and was weaned off. She was discharged on steroid taper. She is still smoking and has recurrent hospitalizations. Says she is trying to quit, no cigs for few days.  CXR follow up on 4/11 showed resolved infiltrate.   Since discharge, she is feeling better but very tired. Has finished prednisone taper. Taking Advair Twice daily   And Spiriva daily.  Wheezing has resolved. Cough is better mainly dry.     Review of Systems Constitutional:   No  weight loss, night sweats,  Fevers, chills,    HEENT:   No headaches,  Difficulty swallowing,  Tooth/dental problems, or  Sore throat,                No sneezing, itching, ear ache, nasal congestion, post nasal drip,   CV:  No chest pain,  Orthopnea, PND, swelling in lower extremities, anasarca, dizziness, palpitations, syncope.   GI  No heartburn, indigestion, abdominal pain, nausea, vomiting, diarrhea, change in bowel habits, loss of appetite, bloody stools.   Resp:   No change in color of mucus.  No wheezing.  No chest wall deformity  Skin: no rash or lesions.  GU: no dysuria, change in color of urine, no urgency or frequency.  No flank pain, no hematuria   MS:  No joint pain or swelling.  No decreased range of  motion.    Psych:  No memory loss.         Objective:   Physical Exam GEN: A/Ox3; pleasant , NAD, obese   HEENT:  [place]/AT,  EACs-clear, TMs-wnl, NOSE-clear drainage , THROAT-clear, no lesions, no postnasal drip or exudate noted.   NECK:  Supple w/ fair ROM; no JVD; normal carotid impulses w/o bruits; no thyromegaly or nodules palpated; no lymphadenopathy.  RESP  Coarse BS w/ no wheezing,    CARD:  RRR, no m/r/g  , no peripheral edema, pulses intact, no cyanosis or clubbing.  GI:   Soft & nt; nml bowel sounds; no organomegaly or masses detected.  Musco:  Warm bil, no deformities or joint swelling noted.   Neuro: alert, no focal deficits noted.    Skin: Warm, no lesions or rashes         Assessment & Plan:

## 2011-03-19 NOTE — Assessment & Plan Note (Signed)
Recent AECOPD w/ assoiciated HC-PNA, vent support  and septic shock- requiring hospitalization Now resolved.  Advised on smoking cesstation Cont on current regimen. follow up Dr. Delton Coombes in 2-3 weeks
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
## 2011-04-09 ENCOUNTER — Encounter: Payer: Self-pay | Admitting: Emergency Medicine

## 2011-04-10 ENCOUNTER — Telehealth: Payer: Self-pay | Admitting: Emergency Medicine

## 2011-04-10 NOTE — Telephone Encounter (Signed)
i don't recommend it either

## 2011-04-10 NOTE — Telephone Encounter (Signed)
Called, spoke with pt.  States she is having a difficult time quitting smoking.  States she has tried the patch, gum, chantix, and "cold [place]" with no success.  Pt would like to know what RB thinks about the mystic electronic cig.  RB is out of office until Monday but pt requesting an answer before then.  I advised some drs do not recommend this -- pt is requesting I send message to on call dr to see what his thoughts are about this.  Dr. Sherene Sires, pls advise.  Thanks!

## 2011-04-10 NOTE — Telephone Encounter (Signed)
PT RETURNED CALL FROM TRIAGE. [name]  °

## 2011-04-10 NOTE — Telephone Encounter (Signed)
[id number] x1 
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
## 2011-04-10 NOTE — Telephone Encounter (Signed)
Called, spoke with pt.  She is aware MW doesn't rec electronic cig and verbalized understanding of this.

## 2011-04-13 ENCOUNTER — Ambulatory Visit: Payer: Medicaid Other | Admitting: Emergency Medicine

## 2011-04-15 ENCOUNTER — Ambulatory Visit (HOSPITAL_COMMUNITY): Payer: Medicaid Other | Admitting: Psychiatry

## 2011-04-15 DIAGNOSIS — F3189 Other bipolar disorder: Secondary | ICD-10-CM

## 2011-04-16 ENCOUNTER — Ambulatory Visit: Payer: Medicaid Other | Admitting: Emergency Medicine
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
## 2011-04-29 ENCOUNTER — Encounter: Payer: Self-pay | Admitting: Emergency Medicine

## 2011-04-29 ENCOUNTER — Ambulatory Visit (INDEPENDENT_AMBULATORY_CARE_PROVIDER_SITE_OTHER): Payer: Medicaid Other | Admitting: Emergency Medicine

## 2011-04-29 DIAGNOSIS — J449 Chronic obstructive pulmonary disease, unspecified: Secondary | ICD-10-CM

## 2011-04-29 NOTE — Patient Instructions (Signed)
Continue your Spiriva and Advair Wear your oxygen at all times Cut your cigarettes down to 7 a day by our next visit Follow with Dr byrum in 2 months or sooner if you have any problems.

## 2011-04-29 NOTE — Progress Notes (Signed)
Subjective:    Patient ID: [name], female    DOB: 1966/07/06, 45 y.o.   MRN: [id number]  HPI 45 yo woman, heavy smoker, treated 1-2/11 for necrotic PNA and ARDS, resp failure. Also w hx substance abuse, narcotics use for back pain and Bipolar disorder  11/2009- Follow up-- /C from hosp to SNF, but left AMA. Has been living with her mom. Taking percocet 15mg  1 -2 x a day (off the streets). Tells me that she is slowly recovering but feels she may be at a plateau. She can't stop smoking. Has freq cough with clear sputum. breathing OK. She does have BD's that she was on prior to her hospitalization. Advair + Spiriva, as needed ventolin.   ROV 04/16/10 -- f/u for COPD, admission for necrotic PNA and ARDS (s/p trach, decanulated). Still smoking 2 pk/day. Taking her BDs reliably. Having trouble with memory loss. Hasn't used THC since May. No exacerbations since last visit. Hasn't had PFT yet. Activity level has improved.   ROV 07/22/10 -- COPD and tobacco. Has cut down to 1 pk/day. Still using narcs off the street occas but planning for MRI and pain clinic appointment in [facility]. She feels better, breathing is better, has been walking every day. Advair + Spiriva.   ROV 09/01/10 -- tobacco abuse and COPD. She began to have increased cough, dark sputum since mid September. Was treated with Pred last Friday for her back pain, ? allergic rxn to neurontin, finishing prednisone in 2 days. Quit going to Pain clinic. Asking me for meds for anxiety.   ROV 09/12/10 -- COPD and tobacco, still using 1pk/day. last time treated for AE/bronchitis with Avelox (she was already on pred). Feels better, mucous is improved. Still has basline wheeze.   03/19/2011 Hospital follow up  PT returns today for hospital follow up. She was admitted 4/5-4/11/12 for AECOPD, Acute on chronic resp failure and Pneumonia-Hospital associated with septic shock. She did require vent support /5-4/7 (self extubated). Tx with broad  spectrum abx., IV steroids, and nebs. CXR showed right >left aspdz (basilar). Pan cultures were neg. Initially required pressor support and fluid challenge with good response and was weaned off. She was discharged on steroid taper. She is still smoking and has recurrent hospitalizations. Says she is trying to quit, no cigs for few days.  CXR follow up on 4/11 showed resolved infiltrate.   Since discharge, she is feeling better but very tired. Has finished prednisone taper. Taking Advair Twice daily   And Spiriva daily.  Wheezing has resolved. Cough is better mainly dry.   ROV 04/29/11 -- severe COPD, tobacco abuse, recent admit for CAP and sepsis. Has been managed on Advair and Spiriva.  She has done OK since d/c and last visit, no Pred or abx or hosp since last time. She has been active, does have to walk if she gets in a rush. She can't walk through [place] but does walk thru the grocery store. Some cough in the am, prod of white phlegm. She is smoking 1/2 pk/day (down from 2pk a day). She is interested in stopping.    Review of Systems Constitutional:   No  weight loss, night sweats,  Fevers, chills,    HEENT:   No headaches,  Difficulty swallowing,  Tooth/dental problems, or  Sore throat,                No sneezing, itching, ear ache, nasal congestion, post nasal drip,   CV:  No chest pain,  Orthopnea, PND, swelling in lower extremities, anasarca, dizziness, palpitations, syncope.   GI  No heartburn, indigestion, abdominal pain, nausea, vomiting, diarrhea, change in bowel habits, loss of appetite, bloody stools.   Resp:   No change in color of mucus.  No wheezing.  No chest wall deformity  Skin: no rash or lesions.  GU: no dysuria, change in color of urine, no urgency or frequency.  No flank pain, no hematuria   MS:  No joint pain or swelling.  No decreased range of motion.    Psych:  No memory loss.      Objective:   Physical Exam GEN: A/Ox3; pleasant , NAD, obese   HEENT:   [place]/AT,  EACs-clear, TMs-wnl, NOSE-clear drainage , THROAT-clear, no lesions, no postnasal drip or exudate noted.   NECK:  Supple w/ fair ROM; no JVD; normal carotid impulses w/o bruits; no thyromegaly or nodules palpated; no lymphadenopathy.  RESP low pitched B wheeze  CARD:  RRR, no m/r/g  , no peripheral edema, pulses intact, no cyanosis or clubbing.  Musco: Warm bil, no deformities or joint swelling noted.   Neuro: alert, no focal deficits noted.    Skin: Warm, no lesions or rashes   Assessment & Plan:  COPD Same regimen Discussed smoking cessation - set goal for 7 cig/day by next visit, then we will decrease further

## 2011-04-29 NOTE — Assessment & Plan Note (Signed)
Same regimen Discussed smoking cessation - set goal for 7 cig/day by next visit, then we will decrease further

## 2011-05-03 ENCOUNTER — Emergency Department (HOSPITAL_COMMUNITY): Payer: Medicaid Other

## 2011-05-03 ENCOUNTER — Emergency Department (HOSPITAL_COMMUNITY)
Admission: EM | Admit: 2011-05-03 | Discharge: 2011-05-04 | Disposition: A | Discharge status: Home / Self Care | Payer: Medicaid Other | Attending: Emergency Medicine | Admitting: Emergency Medicine

## 2011-05-03 DIAGNOSIS — R059 Cough, unspecified: Secondary | ICD-10-CM | POA: Insufficient documentation

## 2011-05-03 DIAGNOSIS — Z79899 Other long term (current) drug therapy: Secondary | ICD-10-CM | POA: Insufficient documentation

## 2011-05-03 DIAGNOSIS — R05 Cough: Secondary | ICD-10-CM | POA: Insufficient documentation

## 2011-05-03 DIAGNOSIS — J3489 Other specified disorders of nose and nasal sinuses: Secondary | ICD-10-CM | POA: Insufficient documentation

## 2011-05-03 DIAGNOSIS — J4489 Other specified chronic obstructive pulmonary disease: Secondary | ICD-10-CM | POA: Insufficient documentation

## 2011-05-03 DIAGNOSIS — R079 Chest pain, unspecified: Secondary | ICD-10-CM | POA: Insufficient documentation

## 2011-05-03 DIAGNOSIS — R0600 Dyspnea, unspecified: Secondary | ICD-10-CM

## 2011-05-03 DIAGNOSIS — J449 Chronic obstructive pulmonary disease, unspecified: Secondary | ICD-10-CM | POA: Insufficient documentation

## 2011-05-03 LAB — BASIC METABOLIC PANEL
BUN: 5 mg/dL — ABNORMAL LOW (ref 6–23)
Chloride: 98 mEq/L (ref 96–112)
Creatinine, Ser: 0.48 mg/dL (ref 0.4–1.2)
GFR calc Af Amer: >60 mL/min (ref >60)
GFR calc non Af Amer: >60 mL/min (ref >60)
Potassium: 3.7 mEq/L (ref 3.5–5.1)

## 2011-05-03 LAB — DIFFERENTIAL
Basophils Absolute: 0 10*3/uL (ref 0.0–0.1)
Eosinophils Absolute: 0.4 10*3/uL (ref 0.0–0.7)
Eosinophils Relative: 3 % (ref 0–5)
Lymphocytes Relative: 20 % (ref 12–46)
Monocytes Absolute: 1.2 10*3/uL — ABNORMAL HIGH (ref 0.1–1.0)

## 2011-05-03 LAB — CBC
HCT: 38.4 % (ref 36.0–46.0)
MCHC: 30.2 g/dL (ref 30.0–36.0)
Platelets: 362 10*3/uL (ref 150–400)
RDW: 16.8 % — ABNORMAL HIGH (ref 11.5–15.5)
WBC: 13.2 10*3/uL — ABNORMAL HIGH (ref 4.0–10.5)

## 2011-05-04 ENCOUNTER — Encounter (HOSPITAL_COMMUNITY): Payer: Medicaid Other | Admitting: Psychiatry

## 2011-05-04 DIAGNOSIS — F3189 Other bipolar disorder: Secondary | ICD-10-CM

## 2011-05-13 ENCOUNTER — Ambulatory Visit (HOSPITAL_COMMUNITY): Payer: Medicaid Other | Admitting: Psychiatry

## 2011-05-17 IMAGING — CR DG LUMBAR SPINE COMPLETE 4+V
5 series · 5 of 5 positions shown · non-contrast
Comparison: CT examination 02/09/2010.

CLINICAL DATA: History of chronic low back pain.  Pain radiates
into the left hip and leg.

LUMBAR SPINE - COMPLETE 4+ VIEW

[view not recorded (1 of 5)]
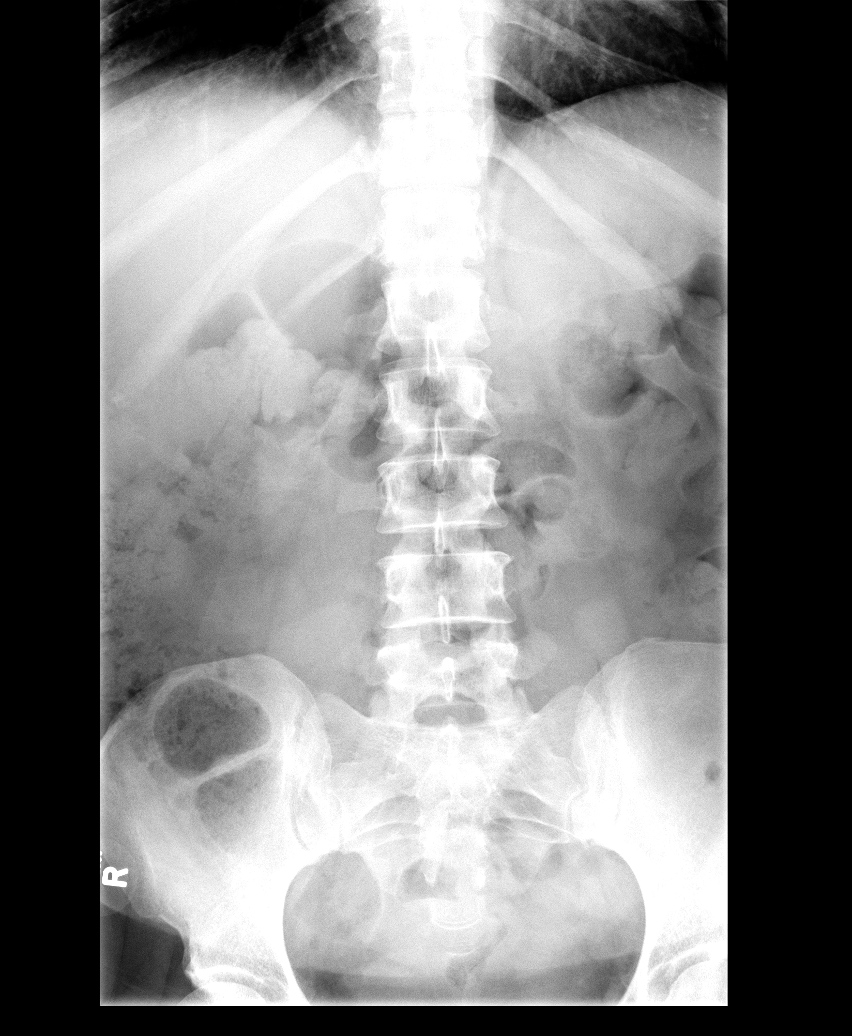

[view not recorded (2 of 5)]
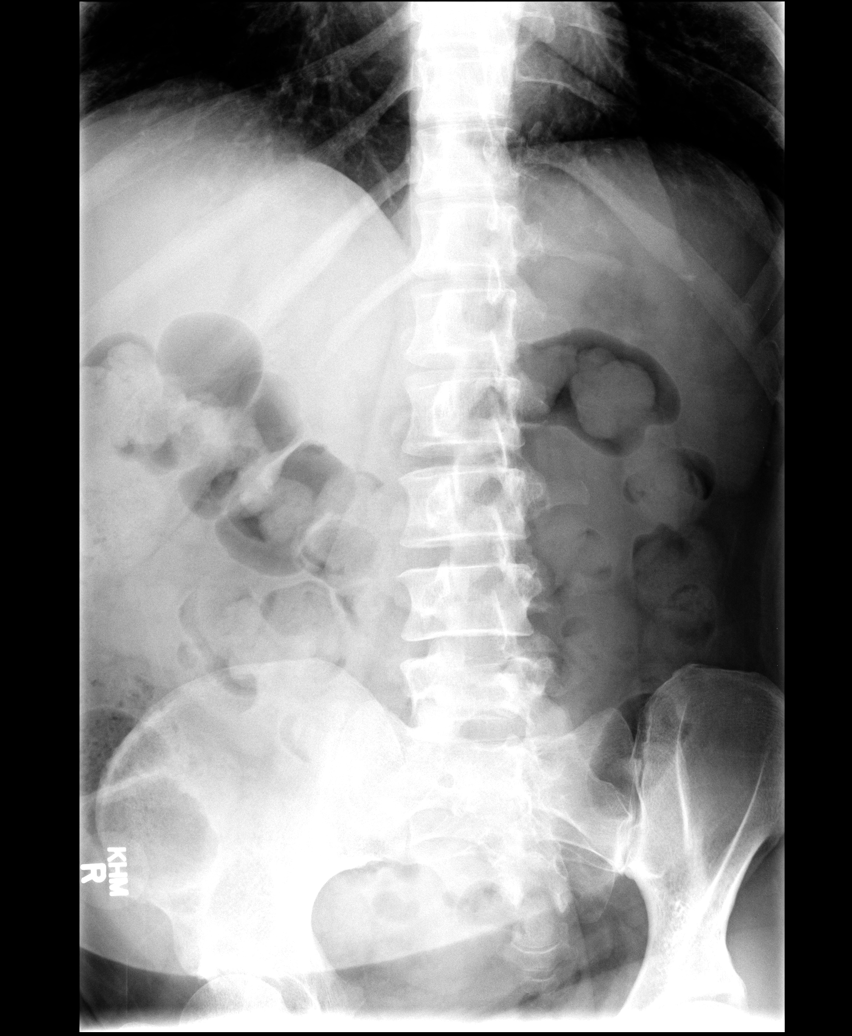

[view not recorded (3 of 5)]
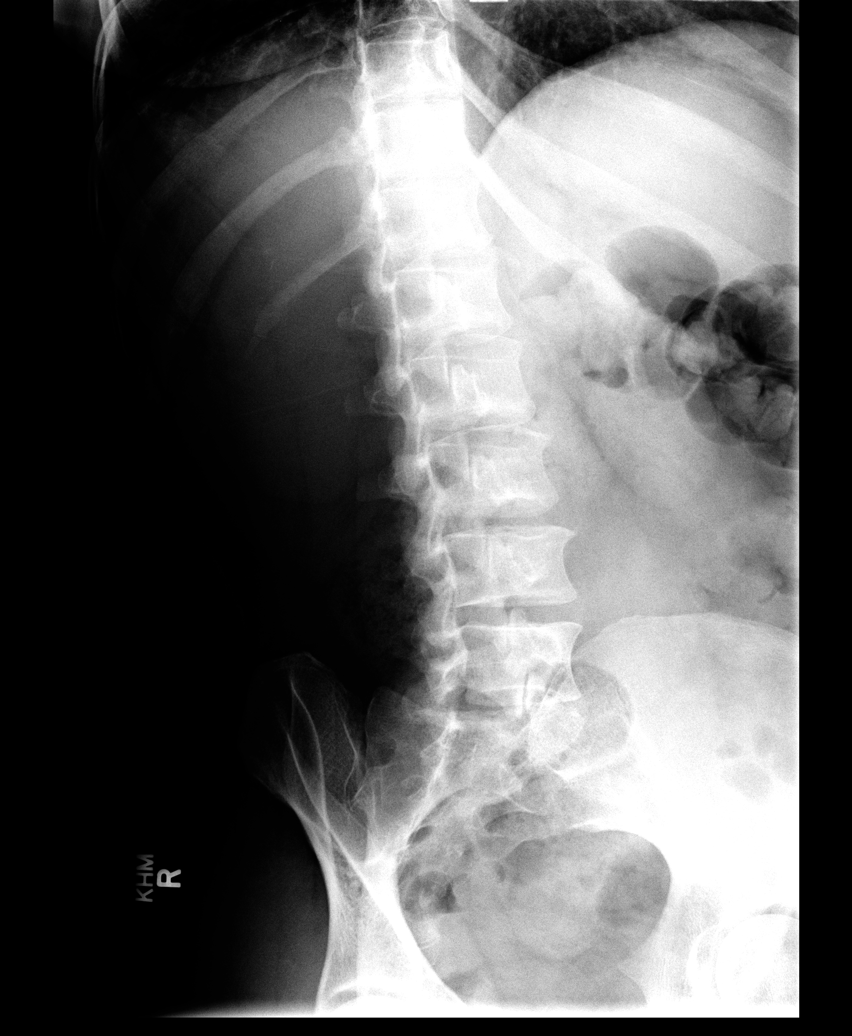

[view not recorded (4 of 5)]
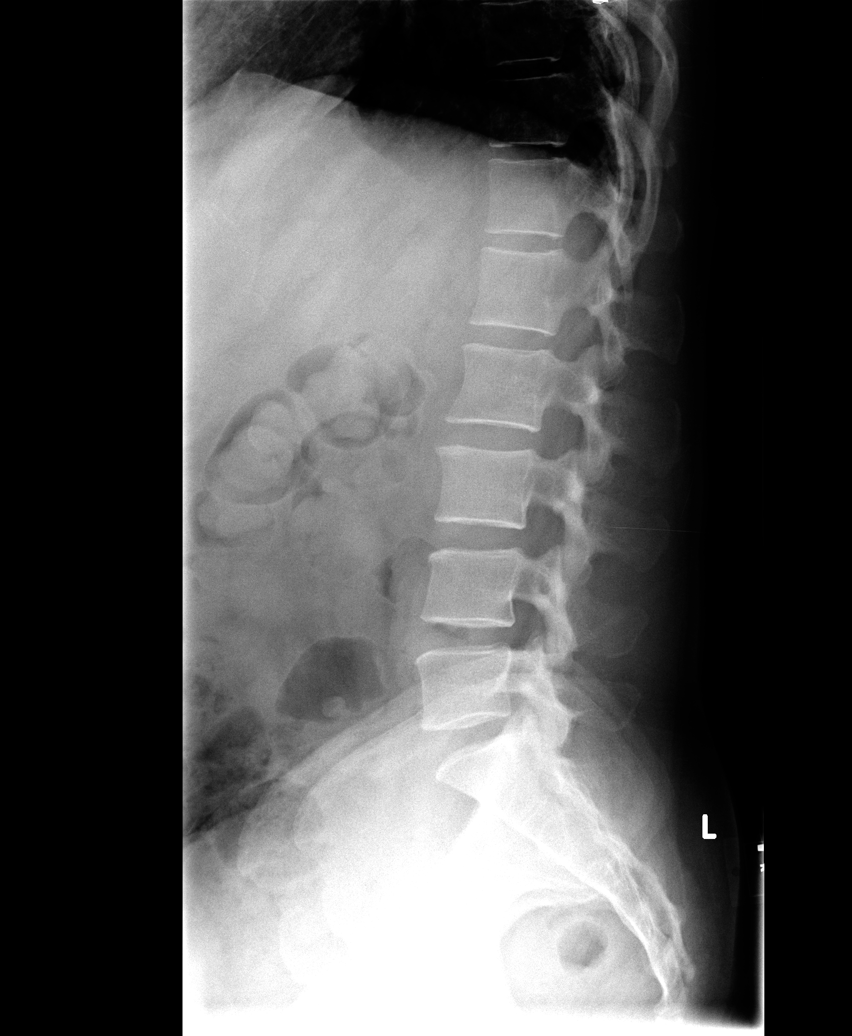

[view not recorded (5 of 5)]
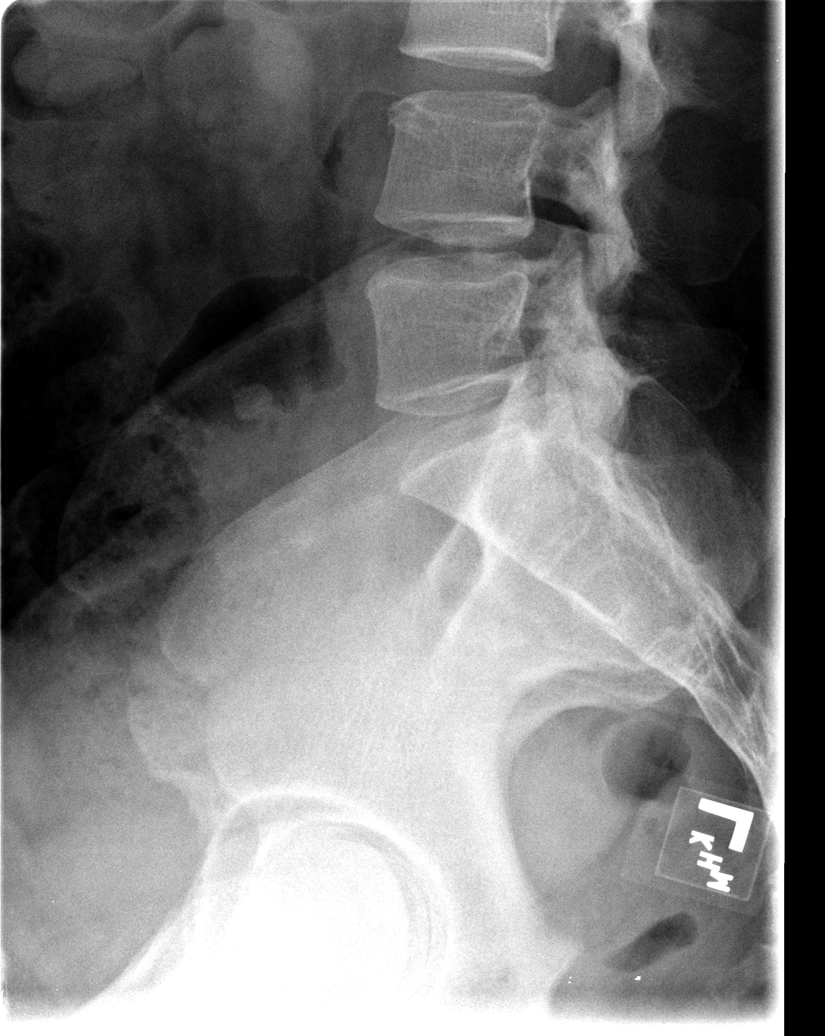

[5 of 5 positions shown; findings below may reference images not displayed]

FINDINGS: There is moderate fecal distention of portions of the
colon.  SI joints appear intact.  Intervertebral disc spaces are
maintained.  Alignment is normal.  There is minimal marginal
osteophyte formation representing minimal early degenerative
spondylosis.  No fracture or bony destruction is evident.
IMPRESSION: Minimal early degenerative spondylosis changes are seen.  Normal
appearance of lumbar spine otherwise.  There is moderate fecal
distention of portions of the colon.

## 2011-05-19 ENCOUNTER — Encounter (HOSPITAL_COMMUNITY): Payer: Medicaid Other | Admitting: Psychiatry

## 2011-06-04 ENCOUNTER — Ambulatory Visit (HOSPITAL_COMMUNITY): Payer: Medicaid Other | Admitting: Physical Therapy

## 2011-06-04 ENCOUNTER — Telehealth (HOSPITAL_COMMUNITY): Payer: Self-pay | Admitting: Physical Therapy

## 2011-06-16 ENCOUNTER — Ambulatory Visit (HOSPITAL_COMMUNITY)
Admission: RE | Admit: 2011-06-16 | Discharge: 2011-06-16 | Disposition: A | Discharge status: Home / Self Care | Payer: Medicaid Other | Source: Ambulatory Visit | Attending: Anesthesiology | Admitting: Anesthesiology

## 2011-06-16 DIAGNOSIS — M545 Low back pain, unspecified: Secondary | ICD-10-CM | POA: Insufficient documentation

## 2011-06-16 DIAGNOSIS — M62838 Other muscle spasm: Secondary | ICD-10-CM | POA: Insufficient documentation

## 2011-06-16 DIAGNOSIS — R262 Difficulty in walking, not elsewhere classified: Secondary | ICD-10-CM | POA: Insufficient documentation

## 2011-06-16 DIAGNOSIS — J449 Chronic obstructive pulmonary disease, unspecified: Secondary | ICD-10-CM | POA: Insufficient documentation

## 2011-06-16 DIAGNOSIS — J4489 Other specified chronic obstructive pulmonary disease: Secondary | ICD-10-CM | POA: Insufficient documentation

## 2011-06-16 DIAGNOSIS — M6281 Muscle weakness (generalized): Secondary | ICD-10-CM | POA: Insufficient documentation

## 2011-06-16 DIAGNOSIS — IMO0001 Reserved for inherently not codable concepts without codable children: Secondary | ICD-10-CM | POA: Insufficient documentation

## 2011-06-16 NOTE — Progress Notes (Signed)
Physical Therapy Evaluation  Patient Name: [name] Date: 06/16/2011  Initial Evaluation: 06/16/11 Visit # 1 Time [id number] Charges: 1 IE Exercise: Prone Press Up x30 sec Supine:  Piriformis stretch 30 sec BLE  Bridging 10x          Physical Therapy MEDICAID EVALUATION  Patient Name: [name] Date Of Birth: July 19, 1966 Guardian Name: N/A Treatment ICD-9 Code: 78295 Address: [address] Date of Evaluation: 06/16/2011 [place], [place] [address] Requested Dates of Service: 06/16/2011 - 07/28/2011 Therapy History: No known therapy for this problem Reason For Referral: Recipient has an ongoing injury, disease or condition Prior Level of Function: Independent/Modified Independent with all ADLs (OT/PT) or Audition, Communication, Voice and/or Swallowing Skills (ST/AUD) Additional Medical History: Pt is a 45 y.o Caucasian female who is referred to [place] for LBP that has been going on for a year. She reports that she started to notice her pain when she is doing normal household activities. She reports she had a myleogram and they found that she has a herniated disc and DDD. She reports some numbness and tingling to her LLE. She reports normal B&B function. PMH: COPD, Emphesyema, Pnemonia, Uterine surgery, Quit smoking March 5th 2012. Prior Function: Stand: 20 minutes, Sit: 30 minutes, Walk: 1/2 mile. Able to complete household activities independently. Current Function: Stand: 10 minutes, Sit:15 minutes, sit in car: unable to sit in comfortably. Walk: Unable to without pain. Daughters are helping with daily chores around the house. Social: Lives at home with 62 and 74 year old daughters and fiance and 2 dogs. Enjoys cleaning, going to the mall and going out to eat. Pain: Current 10/10, Worst 10/10, 8/10 Nature: throbs, sharp from middle of her back to her LLE to her foot. Aggrevates: Standing, Sitting Alleviates: Laying down alleviates the pain. Goals: :I would like to be able to clean my house, walk my dog  and play with my children. OBJECTIVE: Posture: Ridgid lumbar posture while sitting in chair. Able to sit for 10 minutes without increased movement. has decreased pain when left sidelying. Gait: Ambulates with ridgid lumbar posture. Ambulates with 2L of O2. Neuro: Pt able to heel/toe walk, alternate feet on 6 in. step independently, squat for box on floor. Decreased sensation to LLE: L2 and L5 distrubution. Flexibility/Special Tests: + hoovers, + ober's, + FABER, - 90/90 hamstring ( pt with increased pain). Increased pain with repeated flexion, decreased with extension. + L SLR, - R SLR Prematurity: N/A Severity Level: N/A Treatment Goals:  1. Goal: Pt will be independent with HEP in order to maximize therapeutic effect.  Baseline: None Given  Duration: 2 Week(s)  2. Goal: Pt will improve BLE strength in order to ambulate for 15 minutes to continue with her walking program to decrease risk of secondary impairments.  Baseline: MMT: (R/L): hip flexion 4/5, 4/5 Gluteus Medius: 3+/5, 3/5, Gluteus Maximus: 3/5, 3/5, Knee extension: 4/5, 4/5, Knee flexion: 4/5, 3/5, Tibialis Anterior: 4/5, 4/5  Duration: 4 Week(s)  3. Goal: Pt will improve lumbar AROM in order to tolerate sitting for 30 minutes without an increase in pain.  Baseline: Lumbar flexion 48 cm L SB: 14 cm R SB: 19 cm R and L Rotation: decreased 50%  Duration: 6 Week(s)  4. Goal: Pt will improve balance in order to improve safety while ambulating the community.  Baseline: R single leg stance 6 sec on static surface L Single leg stance 8 sec on static surface  Duration: 4 Week(s)  5. Goal: Pt will  present with minimal spasms to her erector spinae and gluteal region for improved muscle health.  Baseline: Moderate spasms with tenderness to her Bilateral erector spinae and Bilateral gluteal region.  Duration: 6 Week(s)  Treatment Frequency/Duration: 2x/week for 6 months  Units per visit: N/A  Additional Information: Pt is a 45 y.o. female referred  to PT for LBP. After examination it was found that the patient has current body structure impairments including: increased pain with radicular pain to L leg, moderate gluteal spasms, decreased LE and core strength, decreased flexibility, and impaired balance which are limiting her ability to participate in community and household activities and functions. Pt will benefit from skilled PT service to address the above body structure impairments in order to maximize function in order to improve quality of life. Plan: 1. Therapeutic exercise to include stretching, strengthening and HEP. 2. Gait training 3. Balance re-education 4. Manual techniques and modalities as needed for pain reduction.     Therapist Signature Date Physician Signature Date  Annett Fabian  Therapist Name Physician Name   Tauno Falotico 06/16/2011, 10:08 AM

## 2011-06-24 ENCOUNTER — Inpatient Hospital Stay (HOSPITAL_COMMUNITY)
Admission: RE | Admit: 2011-06-24 | Discharge: 2011-06-24 | Payer: Medicaid Other | Source: Ambulatory Visit | Attending: Physical Therapy | Admitting: Physical Therapy

## 2011-06-24 NOTE — Progress Notes (Signed)
RE: [facility] [place] Medicaid ID #: [id number] G Service Requested: Physical Therapy Amount Approved: 3 units Frequency Approved: NA Period Approved: 06/16/2011-07/28/2011

## 2011-07-01 ENCOUNTER — Telehealth (HOSPITAL_COMMUNITY): Payer: Self-pay

## 2011-07-01 ENCOUNTER — Inpatient Hospital Stay (HOSPITAL_COMMUNITY): Admission: RE | Admit: 2011-07-01 | Payer: Medicaid Other | Source: Ambulatory Visit | Admitting: Physical Therapy

## 2011-07-03 ENCOUNTER — Ambulatory Visit (INDEPENDENT_AMBULATORY_CARE_PROVIDER_SITE_OTHER): Payer: Medicaid Other | Admitting: Emergency Medicine

## 2011-07-03 ENCOUNTER — Encounter: Payer: Self-pay | Admitting: Emergency Medicine

## 2011-07-03 DIAGNOSIS — J449 Chronic obstructive pulmonary disease, unspecified: Secondary | ICD-10-CM

## 2011-07-03 DIAGNOSIS — B370 Candidal stomatitis: Secondary | ICD-10-CM

## 2011-07-03 DIAGNOSIS — J4489 Other specified chronic obstructive pulmonary disease: Secondary | ICD-10-CM

## 2011-07-03 MED ORDER — MOXIFLOXACIN HCL 400 MG PO TABS: 400.0000 mg | ORAL_TABLET | Freq: Every day | ORAL | Status: AC

## 2011-07-03 MED ORDER — DIPHENHYD-LIDOCAINE-NYSTATIN MT SUSP: 10.0000 mL | Freq: Three times a day (TID) | OROMUCOSAL | Status: DC

## 2011-07-03 MED ORDER — FLUCONAZOLE 100 MG PO TABS: 100.0000 mg | ORAL_TABLET | Freq: Every day | ORAL | Status: AC

## 2011-07-03 MED ORDER — PREDNISONE 10 MG PO TABS: ORAL_TABLET | ORAL | Status: DC

## 2011-07-03 NOTE — Assessment & Plan Note (Signed)
Will treat w fluconazole x 3 days, magic mouthwash

## 2011-07-03 NOTE — Patient Instructions (Signed)
We will start avelox x 7 days Prednisone taper as directed Fluconazole 100mg  daily x 3 days Nystatin swish and swallow (Magic Mouthwash) x 4 days We will arrange for a portable oxygen concentrator Continue to work hard on stopping your cigarettes. Congratulations on your progress! Follow up with Dr Delton Coombes in 1 month

## 2011-07-03 NOTE — Assessment & Plan Note (Addendum)
Probable acute exacerbation, undertreated with recent rapid pred taper - avelox x 7 days, pred taper - same BD regimen - discussed plan for smoking cessation.

## 2011-07-03 NOTE — Progress Notes (Signed)
Subjective:    Patient ID: [name], female    DOB: May 20, 1966, 44 y.o.   MRN: [id number]  HPI 45 yo woman, heavy smoker, treated 1-2/11 for necrotic PNA and ARDS, resp failure. Also w hx substance abuse, narcotics use for back pain and Bipolar disorder  11/2009- Follow up-- /C from hosp to SNF, but left AMA. Has been living with her mom. Taking percocet 15mg  1 -2 x a day (off the streets). Tells me that she is slowly recovering but feels she may be at a plateau. She can't stop smoking. Has freq cough with clear sputum. breathing OK. She does have BD's that she was on prior to her hospitalization. Advair + Spiriva, as needed ventolin.   ROV 04/16/10 -- f/u for COPD, admission for necrotic PNA and ARDS (s/p trach, decanulated). Still smoking 2 pk/day. Taking her BDs reliably. Having trouble with memory loss. Hasn't used THC since May. No exacerbations since last visit. Hasn't had PFT yet. Activity level has improved.   ROV 07/22/10 -- COPD and tobacco. Has cut down to 1 pk/day. Still using narcs off the street occas but planning for MRI and pain clinic appointment in [facility]. She feels better, breathing is better, has been walking every day. Advair + Spiriva.   ROV 09/01/10 -- tobacco abuse and COPD. She began to have increased cough, dark sputum since mid September. Was treated with Pred last Friday for her back pain, ? allergic rxn to neurontin, finishing prednisone in 2 days. Quit going to Pain clinic. Asking me for meds for anxiety.   ROV 09/12/10 -- COPD and tobacco, still using 1pk/day. last time treated for AE/bronchitis with Avelox (she was already on pred). Feels better, mucous is improved. Still has basline wheeze.   03/19/2011 Hospital follow up  PT returns today for hospital follow up. She was admitted 4/5-4/11/12 for AECOPD, Acute on chronic resp failure and Pneumonia-Hospital associated with septic shock. She did require vent support /5-4/7 (self extubated). Tx with broad  spectrum abx., IV steroids, and nebs. CXR showed right >left aspdz (basilar). Pan cultures were neg. Initially required pressor support and fluid challenge with good response and was weaned off. She was discharged on steroid taper. She is still smoking and has recurrent hospitalizations. Says she is trying to quit, no cigs for few days.  CXR follow up on 4/11 showed resolved infiltrate.   Since discharge, she is feeling better but very tired. Has finished prednisone taper. Taking Advair Twice daily   And Spiriva daily.  Wheezing has resolved. Cough is better mainly dry.   ROV 04/29/11 -- severe COPD, tobacco abuse, recent admit for CAP and sepsis. Has been managed on Advair and Spiriva.  She has done OK since d/c and last visit, no Pred or abx or hosp since last time. She has been active, does have to walk if she gets in a rush. She can't walk through [place] but does walk thru the grocery store. Some cough in the am, prod of white phlegm. She is smoking 1/2 pk/day (down from 2pk a day). She is interested in stopping.   ROV 07/03/11 -- severe COPD, tobacco use. Regular f/u visit. Just treated by PCP with pred taper for low back pain (DJD, disc dz). The pred helped w some of her chest tightness. On Advair + Spiriva, using SABA about 1 -2 x a day. She has cut her cigarettes down significantly, using nicotine patches.    ROS: SOB and cough x 7 days, chest  tightness x 3 days, some wheezing, sore throat, swelling feet and ankles.     Objective:   Physical Exam GEN: A/Ox3; pleasant , NAD, obese   HEENT:  [place]/AT,  EACs-clear, TMs-wnl, NOSE-clear drainage , THROAT-clear, no lesions, no postnasal drip or exudate noted.   NECK:  Supple w/ fair ROM; no JVD; normal carotid impulses w/o bruits; no thyromegaly or nodules palpated; no lymphadenopathy.  RESP low pitched B wheeze, may be slightly more pronounced than last visit.   CARD:  RRR, no m/r/g  , no peripheral edema, pulses intact, no cyanosis or  clubbing.  Musco: Warm bil, no deformities or joint swelling noted.   Neuro: alert, no focal deficits noted.    Skin: Warm, no lesions or rashes   Assessment & Plan:  COPD Probable acute exacerbation, undertreated with recent rapid pred taper - avelox x 7 days, pred taper - same BD regimen - discussed plan for smoking cessation.   Thrush, oral Will treat w fluconazole x 3 days, magic mouthwash

## 2011-07-06 ENCOUNTER — Inpatient Hospital Stay (HOSPITAL_COMMUNITY): Admission: RE | Admit: 2011-07-06 | Payer: Self-pay | Source: Ambulatory Visit | Admitting: Physical Therapy

## 2011-07-13 ENCOUNTER — Telehealth: Payer: Self-pay | Admitting: Emergency Medicine

## 2011-07-13 NOTE — Telephone Encounter (Signed)
Per Libby-put message to [facility] pool and they will check with [facility] liaison as to what is going on with patients O2 order.

## 2011-07-13 NOTE — Telephone Encounter (Signed)
Spoke to [email or web address]  pt has a portable 02 concentrator already and they will be giving her a call to see what she needs

## 2011-07-16 ENCOUNTER — Telehealth: Payer: Self-pay | Admitting: Emergency Medicine

## 2011-07-16 NOTE — Telephone Encounter (Signed)
[id number] to ask pt what type of portable tank she has. Carron Curie, CMA

## 2011-08-17 ENCOUNTER — Other Ambulatory Visit: Payer: Self-pay | Admitting: *Deleted

## 2011-08-17 MED ORDER — ALBUTEROL SULFATE HFA 108 (90 BASE) MCG/ACT IN AERS: 2.0000 | INHALATION_SPRAY | Freq: Four times a day (QID) | RESPIRATORY_TRACT | Status: DC | PRN

## 2011-09-26 ENCOUNTER — Emergency Department (HOSPITAL_COMMUNITY)
Admission: EM | Admit: 2011-09-26 | Discharge: 2011-09-26 | Disposition: A | Discharge status: Home / Self Care | Payer: Medicaid Other | Attending: Emergency Medicine | Admitting: Emergency Medicine

## 2011-09-26 ENCOUNTER — Emergency Department (HOSPITAL_COMMUNITY): Payer: Medicaid Other

## 2011-09-26 ENCOUNTER — Encounter (HOSPITAL_COMMUNITY): Payer: Self-pay

## 2011-09-26 ENCOUNTER — Other Ambulatory Visit: Payer: Self-pay

## 2011-09-26 DIAGNOSIS — M549 Dorsalgia, unspecified: Secondary | ICD-10-CM | POA: Insufficient documentation

## 2011-09-26 DIAGNOSIS — F1410 Cocaine abuse, uncomplicated: Secondary | ICD-10-CM | POA: Insufficient documentation

## 2011-09-26 DIAGNOSIS — R059 Cough, unspecified: Secondary | ICD-10-CM | POA: Insufficient documentation

## 2011-09-26 DIAGNOSIS — F319 Bipolar disorder, unspecified: Secondary | ICD-10-CM | POA: Insufficient documentation

## 2011-09-26 DIAGNOSIS — F1210 Cannabis abuse, uncomplicated: Secondary | ICD-10-CM | POA: Insufficient documentation

## 2011-09-26 DIAGNOSIS — R05 Cough: Secondary | ICD-10-CM | POA: Insufficient documentation

## 2011-09-26 DIAGNOSIS — G8929 Other chronic pain: Secondary | ICD-10-CM | POA: Insufficient documentation

## 2011-09-26 DIAGNOSIS — Z9981 Dependence on supplemental oxygen: Secondary | ICD-10-CM | POA: Insufficient documentation

## 2011-09-26 DIAGNOSIS — J441 Chronic obstructive pulmonary disease with (acute) exacerbation: Secondary | ICD-10-CM | POA: Insufficient documentation

## 2011-09-26 DIAGNOSIS — J069 Acute upper respiratory infection, unspecified: Secondary | ICD-10-CM | POA: Insufficient documentation

## 2011-09-26 DIAGNOSIS — Z87891 Personal history of nicotine dependence: Secondary | ICD-10-CM | POA: Insufficient documentation

## 2011-09-26 DIAGNOSIS — I498 Other specified cardiac arrhythmias: Secondary | ICD-10-CM | POA: Insufficient documentation

## 2011-09-26 DIAGNOSIS — G43909 Migraine, unspecified, not intractable, without status migrainosus: Secondary | ICD-10-CM | POA: Insufficient documentation

## 2011-09-26 HISTORY — DX: Dependence on supplemental oxygen: Z99.81

## 2011-09-26 HISTORY — DX: Migraine, unspecified, not intractable, without status migrainosus: G43.909

## 2011-09-26 HISTORY — DX: Other chronic pain: G89.29

## 2011-09-26 HISTORY — DX: Hyperglycemia, unspecified: R73.9

## 2011-09-26 HISTORY — DX: Dorsalgia, unspecified: M54.9

## 2011-09-26 HISTORY — DX: Acute respiratory distress syndrome: J80

## 2011-09-26 HISTORY — DX: Chronic obstructive pulmonary disease, unspecified: J44.9

## 2011-09-26 HISTORY — DX: Hyperglycemia, unspecified: T50.905A

## 2011-09-26 IMAGING — MR MR LUMBAR SPINE W/O CM
4 of 5 series · 13 of 48 positions shown · non-contrast
Comparison: 04/10/2004

CLINICAL DATA: Low back pain.  Bilateral leg pain, left worse than
right.

MRI LUMBAR SPINE WITHOUT CONTRAST
TECHNIQUE: Multiplanar and multiecho pulse sequences of the lumbar
spine were obtained without intravenous contrast.

[Series 3: T2 · sagittal · 4.0mm · 0.39mm/px · 4 of 12 slices shown (1 of 2)]
[im 1/12]
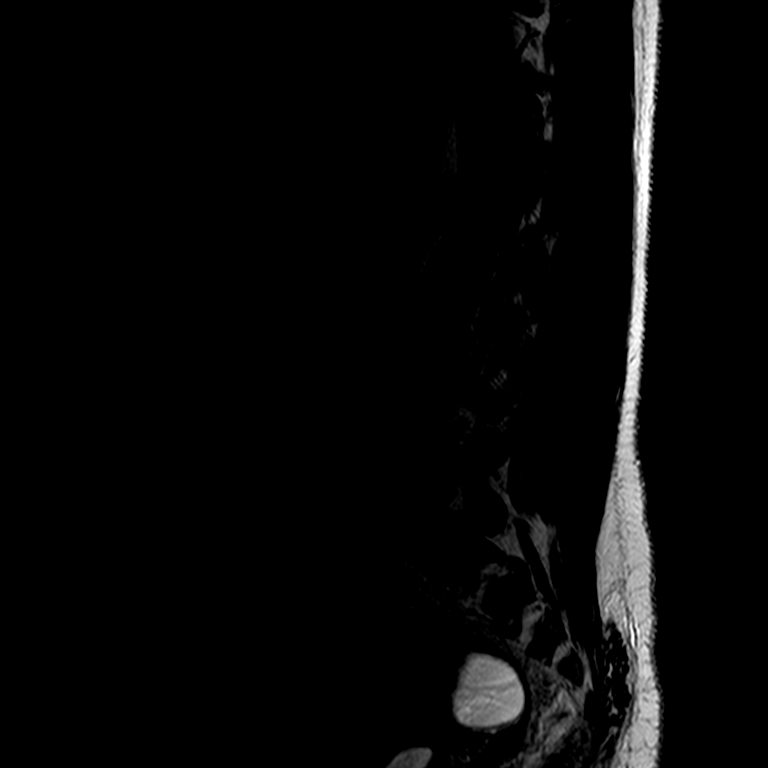
[im 3/12]
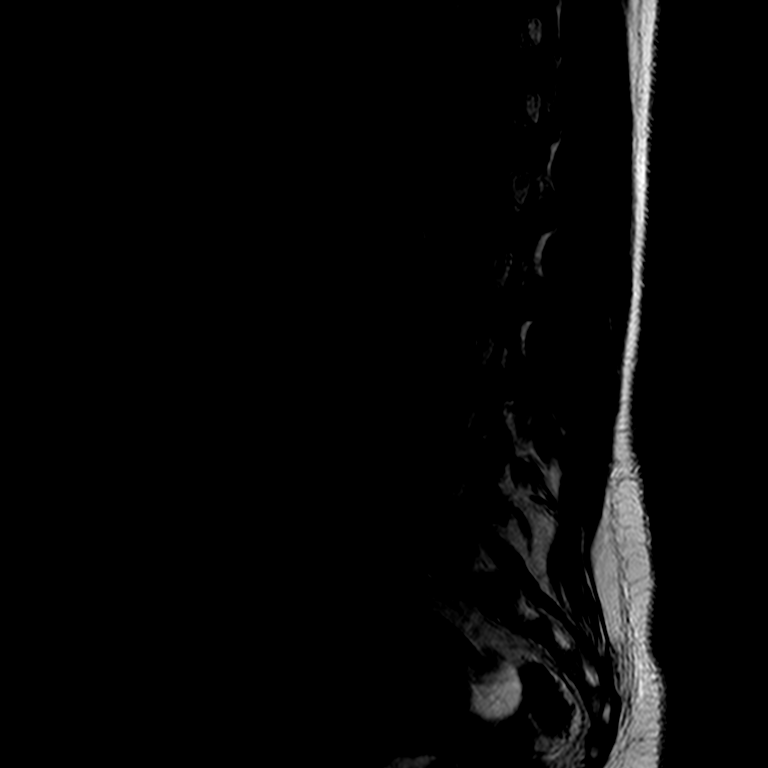
[im 6/12]
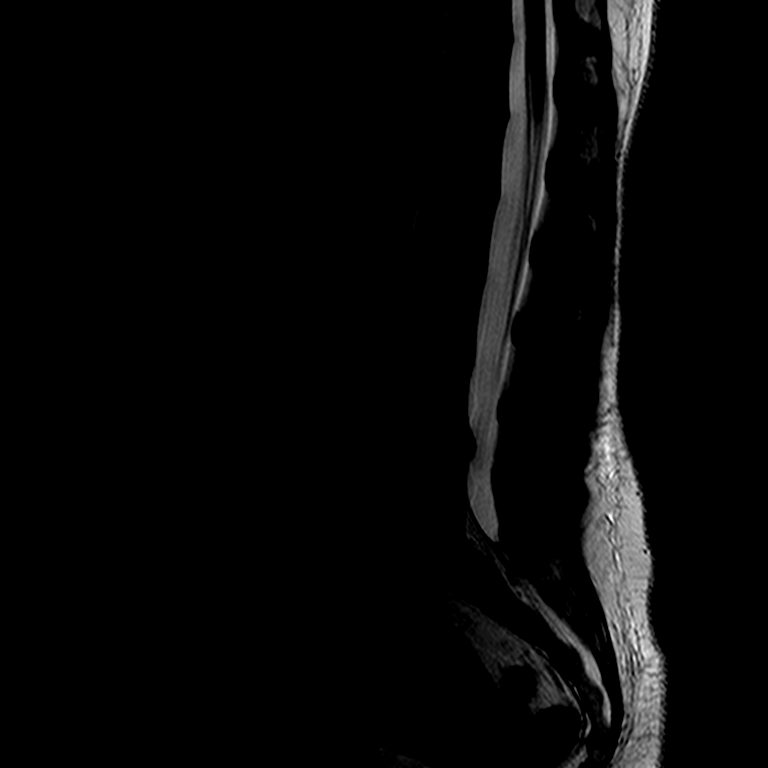
[im 12/12]
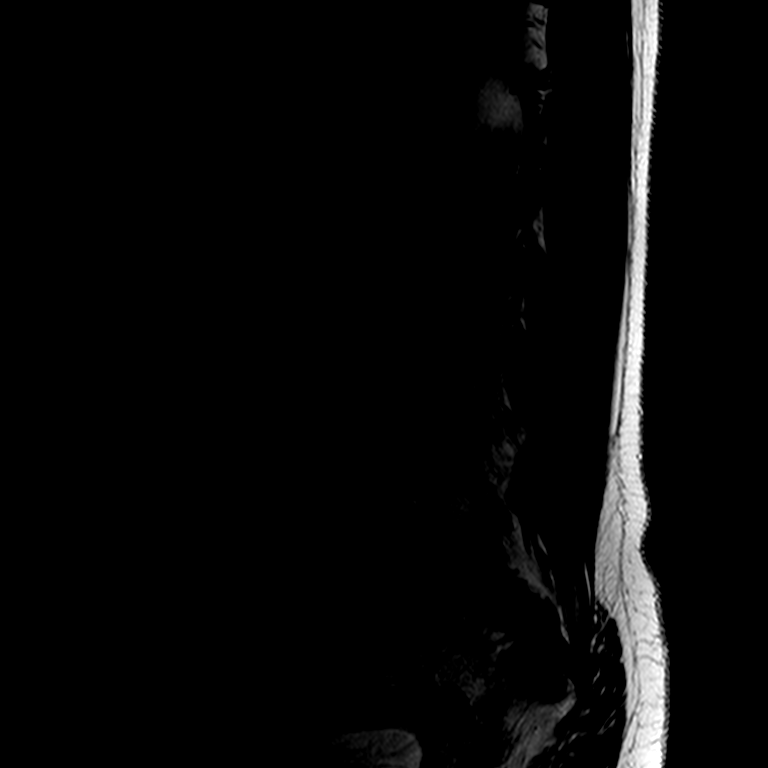

[Series 4: T1 · sagittal · 4.0mm · 0.47mm/px · 3 of 12 slices shown (1 of 2)]
[im 1/12]
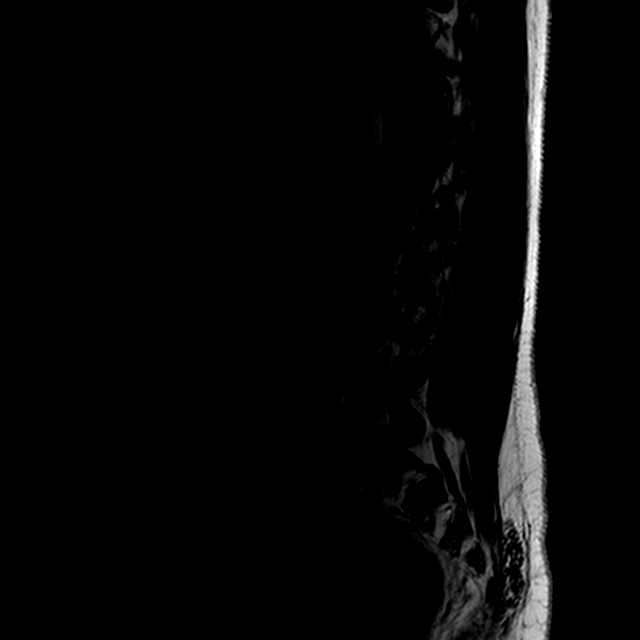
[im 6/12]
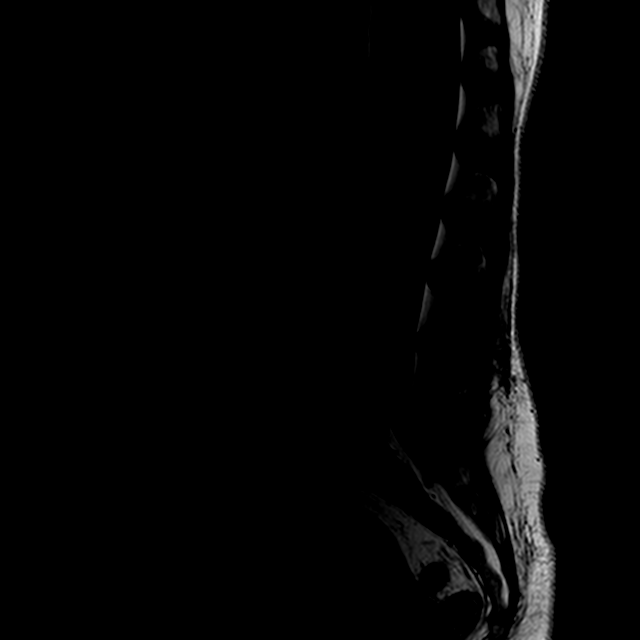
[im 12/12]
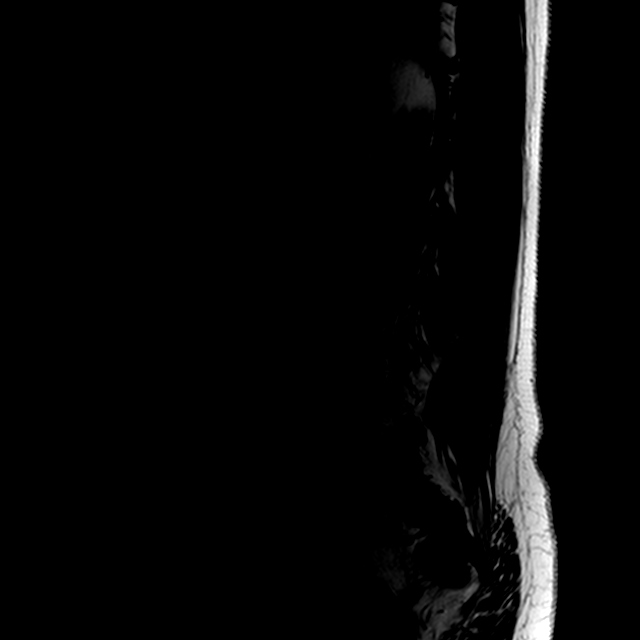

[Series 6: T2 · axial · 4.0mm · 0.26mm/px · z∈[-77,+41]mm · 3 of 34 slices shown (2 of 2)]
[im 5/34]
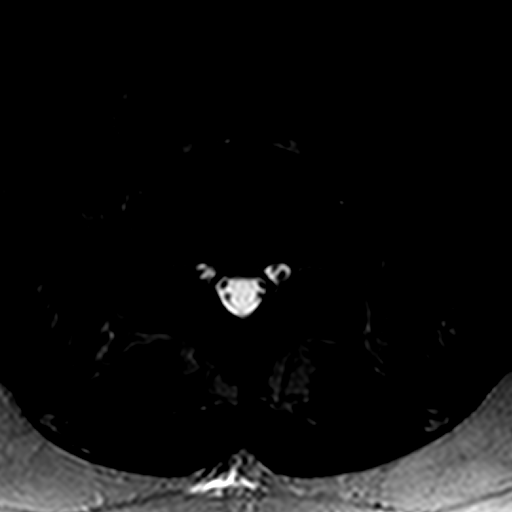
[im 18/34]
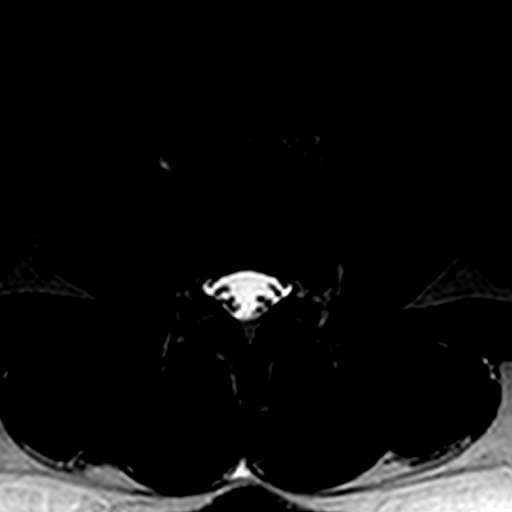
[im 29/34]
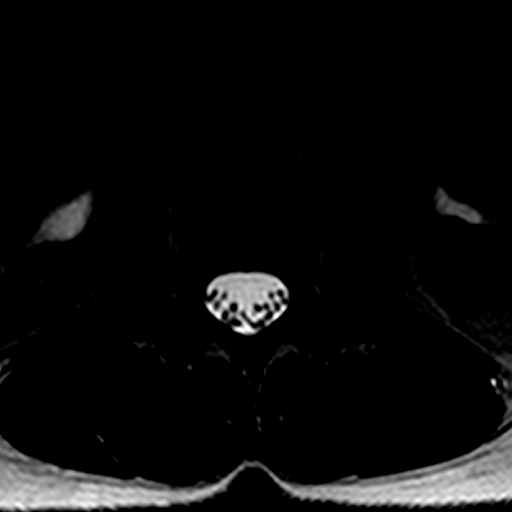

[Series 7: T1 · axial · 4.0mm · 0.24mm/px · z∈[-77,+40]mm · 3 of 34 slices shown (2 of 2)]
[im 5/34]
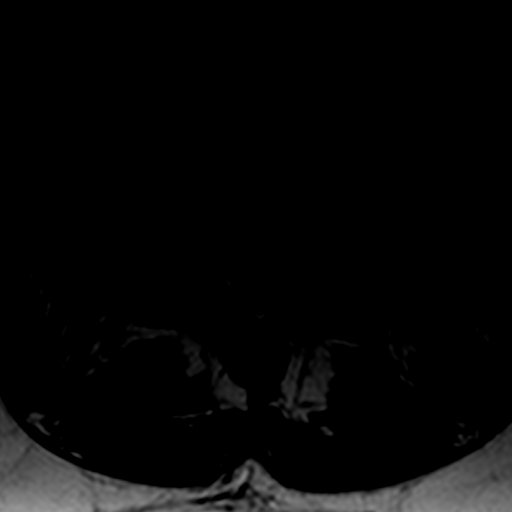
[im 18/34]
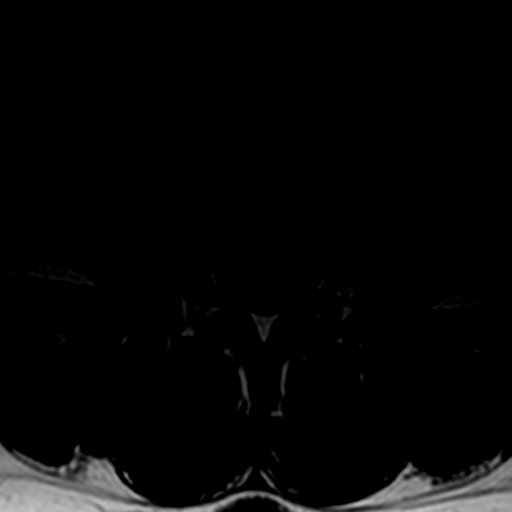
[im 29/34]
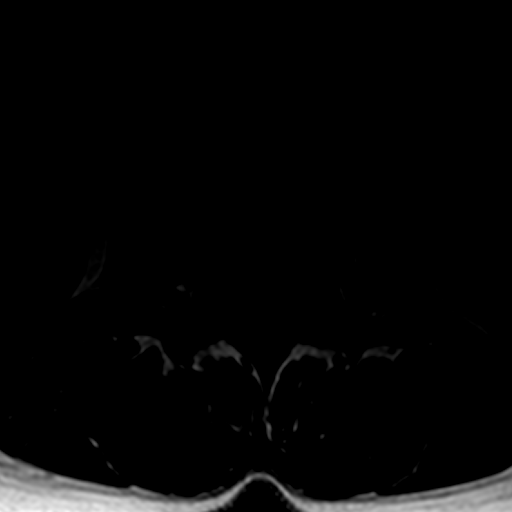

[13 of 48 positions shown; findings below may reference images not displayed]

FINDINGS: There is no significant finding at L3-4 or above.  The
discs are unremarkable.  The canal and foramina are widely patent.
The distal cord and conus are normal.  No osseous or articular
pathology.

At L4-5, the disc is degenerated and there is a broad-based,
centrally prominent disc herniation.  This indents the thecal sac
and narrows both lateral recesses.  This appears slightly more
pronounced on the right.  This could cause neural compressive
symptoms on either or both sides.

The L5-S1 level shows normal appearance of the disc with mild facet
prominence.  No slippage or stenosis.

There is a 3 cm cyst of the right adnexa, not of particular concern
in a patient this age.  This is not completely evaluated.
IMPRESSION: Slight increase in size of the broad-based disc herniation at L4-5,
slightly more prominent towards the right.  Narrowing of both
lateral recesses.  Nerve root compression could occur on either or
both sides.

## 2011-09-26 MED ORDER — ALBUTEROL SULFATE (5 MG/ML) 0.5% IN NEBU
5.0000 mg | INHALATION_SOLUTION | Freq: Once | RESPIRATORY_TRACT | Status: AC
  Administered 2011-09-26: 5 mg via RESPIRATORY_TRACT
  Filled 2011-09-26: qty 1

## 2011-09-26 MED ORDER — PREDNISONE 20 MG PO TABS: 20.0000 mg | ORAL_TABLET | Freq: Every day | ORAL | Status: AC

## 2011-09-26 MED ORDER — DOXYCYCLINE HYCLATE 100 MG PO TABS: 100.0000 mg | ORAL_TABLET | Freq: Two times a day (BID) | ORAL | Status: AC

## 2011-09-26 MED ORDER — IPRATROPIUM BROMIDE 0.02 % IN SOLN
0.5000 mg | Freq: Once | RESPIRATORY_TRACT | Status: AC
  Administered 2011-09-26: 0.5 mg via RESPIRATORY_TRACT
  Filled 2011-09-26: qty 2.5

## 2011-09-26 MED ORDER — METHYLPREDNISOLONE SODIUM SUCC 125 MG IJ SOLR
125.0000 mg | Freq: Once | INTRAMUSCULAR | Status: AC
  Administered 2011-09-26: 125 mg via INTRAVENOUS
  Filled 2011-09-26: qty 2

## 2011-09-26 MED ORDER — DIPHENHYDRAMINE HCL 50 MG/ML IJ SOLN
25.0000 mg | Freq: Once | INTRAMUSCULAR | Status: AC
  Administered 2011-09-26: 25 mg via INTRAVENOUS
  Filled 2011-09-26: qty 1

## 2011-09-26 MED ORDER — METOCLOPRAMIDE HCL 5 MG/ML IJ SOLN
10.0000 mg | Freq: Once | INTRAMUSCULAR | Status: AC
  Administered 2011-09-26: 10 mg via INTRAVENOUS
  Filled 2011-09-26: qty 2

## 2011-09-26 MED ORDER — SODIUM CHLORIDE 0.9 % IV SOLN
INTRAVENOUS | Status: DC
  Administered 2011-09-26: 17:00:00 via INTRAVENOUS

## 2011-09-26 MED ORDER — ACETAMINOPHEN 500 MG PO TABS
1000.0000 mg | ORAL_TABLET | Freq: Once | ORAL | Status: AC
  Administered 2011-09-26: 1000 mg via ORAL
  Filled 2011-09-26: qty 2

## 2011-09-26 NOTE — ED Notes (Addendum)
Pt presents with left sided chest pain, SOB, nausea, and headache x 2 days. Pt uses home O2. Pt visibly SOB. No vomiting at this time.

## 2011-09-26 NOTE — ED Notes (Signed)
Patient ambulated to restroom without difficulty.  Patient to x ray.

## 2011-09-26 NOTE — ED Provider Notes (Signed)
History     CSN: [id number] Arrival date & time: 09/26/2011  4:25 PM     Chief Complaint  Patient presents with  . Nausea  . Headache  . Chest Pain   HPI Pt was seen at 1650.  Per pt, c/o gradual onset and persistence of constant runny/stuffy nose, cough, sinus and ears congestion x2 days.  +daughter at home with same symptoms.  Also, c/o gradual onset and persistence of constant acute flair of her chronic migraine headache for the past 2 days.  Describes the headache as per her usual chronic longstanding migraine headache pain pattern.  Denies headache was sudden or maximal in onset or at any time.  Denies visual changes, no focal motor weakness, no tingling/numbness in extremities, no fevers, no neck pain, no rash, no abd pain, no N/V/D, no CP/palpitations.    Past Medical History  Diagnosis Date  . Migraine headache   . On home O2   . COPD (chronic obstructive pulmonary disease)   . Bipolar disorder   . Chronic back pain   . Hyperglycemia, drug-induced     steroid induced hyperglycemia  . ARDS (adult respiratory distress syndrome)     Jan 2011    Past Surgical History  Procedure Date  . Tubal ligation   . Uterine ablasion   . C-section   . Tracheostomy     decannulated 12/2009    Social History  . Marital Status: Legally Separated   Social History Main Topics  . Smoking status: Former Smoker -- 0.0 packs/day for 33 years  . Smokeless tobacco: None   Comment: smoking 1-2 cigarettes every other day  . Alcohol Use: No  . Drug Use: No     Formerly used cocaine , narcotics, THC    Review of Systems ROS: Statement: All systems negative except as marked or noted in the HPI; Constitutional: Negative for fever and chills. ; ; Eyes: Negative for eye pain, redness and discharge. ; ; ENMT: +runny/stuffy nose, ears and sinus congestion.  Negative for hoarseness and sore throat. ; ; Cardiovascular: Negative for chest pain, palpitations, diaphoresis, dyspnea and peripheral edema.  ; ; Respiratory: +cough, SOB.  Negative for wheezing and stridor. ; ; Gastrointestinal: Negative for nausea, vomiting, diarrhea and abdominal pain, blood in stool, hematemesis, jaundice and rectal bleeding. . ; ; Genitourinary: Negative for dysuria, flank pain and hematuria. ; ; Musculoskeletal: Negative for back pain and neck pain. Negative for swelling and trauma.; ; Skin: Negative for pruritus, rash, abrasions, blisters, bruising and skin lesion.; ; Neuro: +migraine headache.  Negative for lightheadedness and neck stiffness. Negative for weakness, altered level of consciousness , altered mental status, extremity weakness, paresthesias, involuntary movement, seizure and syncope.     Allergies  Penicillins and Morphine  Home Medications   Current Outpatient Rx  Name Route Sig Dispense Refill  . ALBUTEROL SULFATE (2.5 MG/3ML) 0.083% IN NEBU  1 vial every 4 hours as needed. DX 496 120 vial 3  . ALBUTEROL SULFATE HFA 108 (90 BASE) MCG/ACT IN AERS Inhalation Inhale 2 puffs into the lungs every 6 (six) hours as needed. 1 Inhaler 5  . ARIPIPRAZOLE 5 MG PO TABS Oral Take 5 mg by mouth daily.      [name] DIPHENHYD-LIDOCAINE-NYSTATIN MT SUSP Mouth/Throat Use as directed 10 mLs in the mouth or throat 3 (three) times daily. 120 mL 0  . FLUTICASONE-SALMETEROL 250-50 MCG/DOSE IN AEPB Inhalation Inhale 1 puff into the lungs every 12 (twelve) hours. 1 each 5  .  FUROSEMIDE 20 MG PO TABS Oral Take 20 mg by mouth daily.      . GLYBURIDE-METFORMIN 5-500 MG PO TABS Oral Take 1 tablet by mouth 2 (two) times daily with a meal.      . NICOTINE 21 MG/24HR TD PT24 Transdermal Place 1 patch onto the skin daily.      . OXYCODONE-ACETAMINOPHEN 10-325 MG PO TABS Oral Take 1 tablet by mouth every 4 (four) hours as needed.      [name] POTASSIUM CHLORIDE CRYS CR 20 MEQ PO TBCR Oral Take 20 mEq by mouth 2 (two) times daily.      [name] PREDNISONE 10 MG PO TABS  Take 40mg  daily x 3 days, then 30mg  daily x 3 days, then 20mg  daily x 3 days then  10mg  daily x 3 days, then stop 30 tablet 0  . TIOTROPIUM BROMIDE MONOHYDRATE 18 MCG IN CAPS Inhalation Place 1 capsule (18 mcg total) into inhaler and inhale daily. 30 capsule 5  . TRAMADOL HCL 50 MG PO TABS  2 tabs at bedtime     . VENLAFAXINE HCL 75 MG PO TABS Oral Take 75 mg by mouth daily.        BP 111/80  Pulse 115  Temp(Src) 100.4 F (38 C) (Oral)  Resp 19  Ht 5\' 1"  (1.549 m)  Wt 185 lb (83.915 kg)  BMI 34.96 kg/m2  SpO2 95%  Physical Exam 1655: Physical examination:  Nursing notes reviewed; Vital signs and O2 SAT reviewed;  Constitutional: Well developed, Well nourished, Well hydrated, In no acute distress; Head:  Normocephalic, atraumatic; Eyes: EOMI, PERRL, No scleral icterus; ENMT: TM's clear bilat, +edemetous nasal turbinates bilat with clear rhinorrhea. Mouth and pharynx normal, Mucous membranes moist; Neck: Supple, Full range of motion, No lymphadenopathy; Cardiovascular: +tachycardia, No murmur, rub, or gallop; Respiratory: Breath sounds coarse & equal bilaterally with occas scattered wheeze, No audible wheezing.  +tachypneic, speaking in full sentences.  Normal respiratory effort/excursion; Chest: Nontender, Movement normal; Abdomen: Soft, Nontender, Nondistended, Normal bowel sounds; Genitourinary: No CVA tenderness; Extremities: Pulses normal, No tenderness, No edema, No calf edema or asymmetry.; Neuro: AA&Ox3, Major CN grossly intact. Speech clear, no facial droop. No gross focal motor or sensory deficits in extremities.; Skin: Color normal, Warm, Dry, no rash.   ED Course  Procedures   MDM  MDM Reviewed: nursing note and vitals Interpretation: x-ray and ECG    Date: 09/26/2011  Rate: 111  Rhythm: sinus tachycardia  QRS Axis: normal  Intervals: normal  ST/T Wave abnormalities: normal  Conduction Disutrbances:nonspecific intraventricular conduction delay  Narrative Interpretation:   Old EKG Reviewed: none available.   Dg Chest 2 View  09/26/2011  *RADIOLOGY  REPORT*  Clinical Data: Fever.  Cough.  CHEST - 2 VIEW  Comparison: 05/03/2011  Findings: Heart is enlarged.  Mild hyperinflation of the lungs. Right basilar atelectasis.  No effusions.  No acute bony abnormality.  IMPRESSION: Cardiomegaly, hyperinflation.  Right base atelectasis.  Original Report Authenticated By: Cyndie Chime, M.D.   7:09 PM:  Pt states she feels "better now" after neb, steroid and migraine headache meds.  Wants to go home.  Sats 97% on O2 2L N/C (pt's norm).  Has ambulated in ED to the BR with easy resps, steady gait.  Has hx COPD, will tx with doxycycline and steroids.  Pt states she has enough MDI/nebs at home.  Dx testing d/w pt and family.  Questions answered.  Verb understanding, agreeable to d/c home with outpt f/u.  Laylamarie [employer] Allison Quarry, DO 09/28/11 1252

## 2011-10-07 IMAGING — CR DG CHEST 2V
2 series · 2 of 2 positions shown · non-contrast
Comparison: Chest x-ray of 01/28/2010

CLINICAL DATA: Cough, short of breath, smoking history

CHEST - 2 VIEW

[view not recorded (1 of 2)]
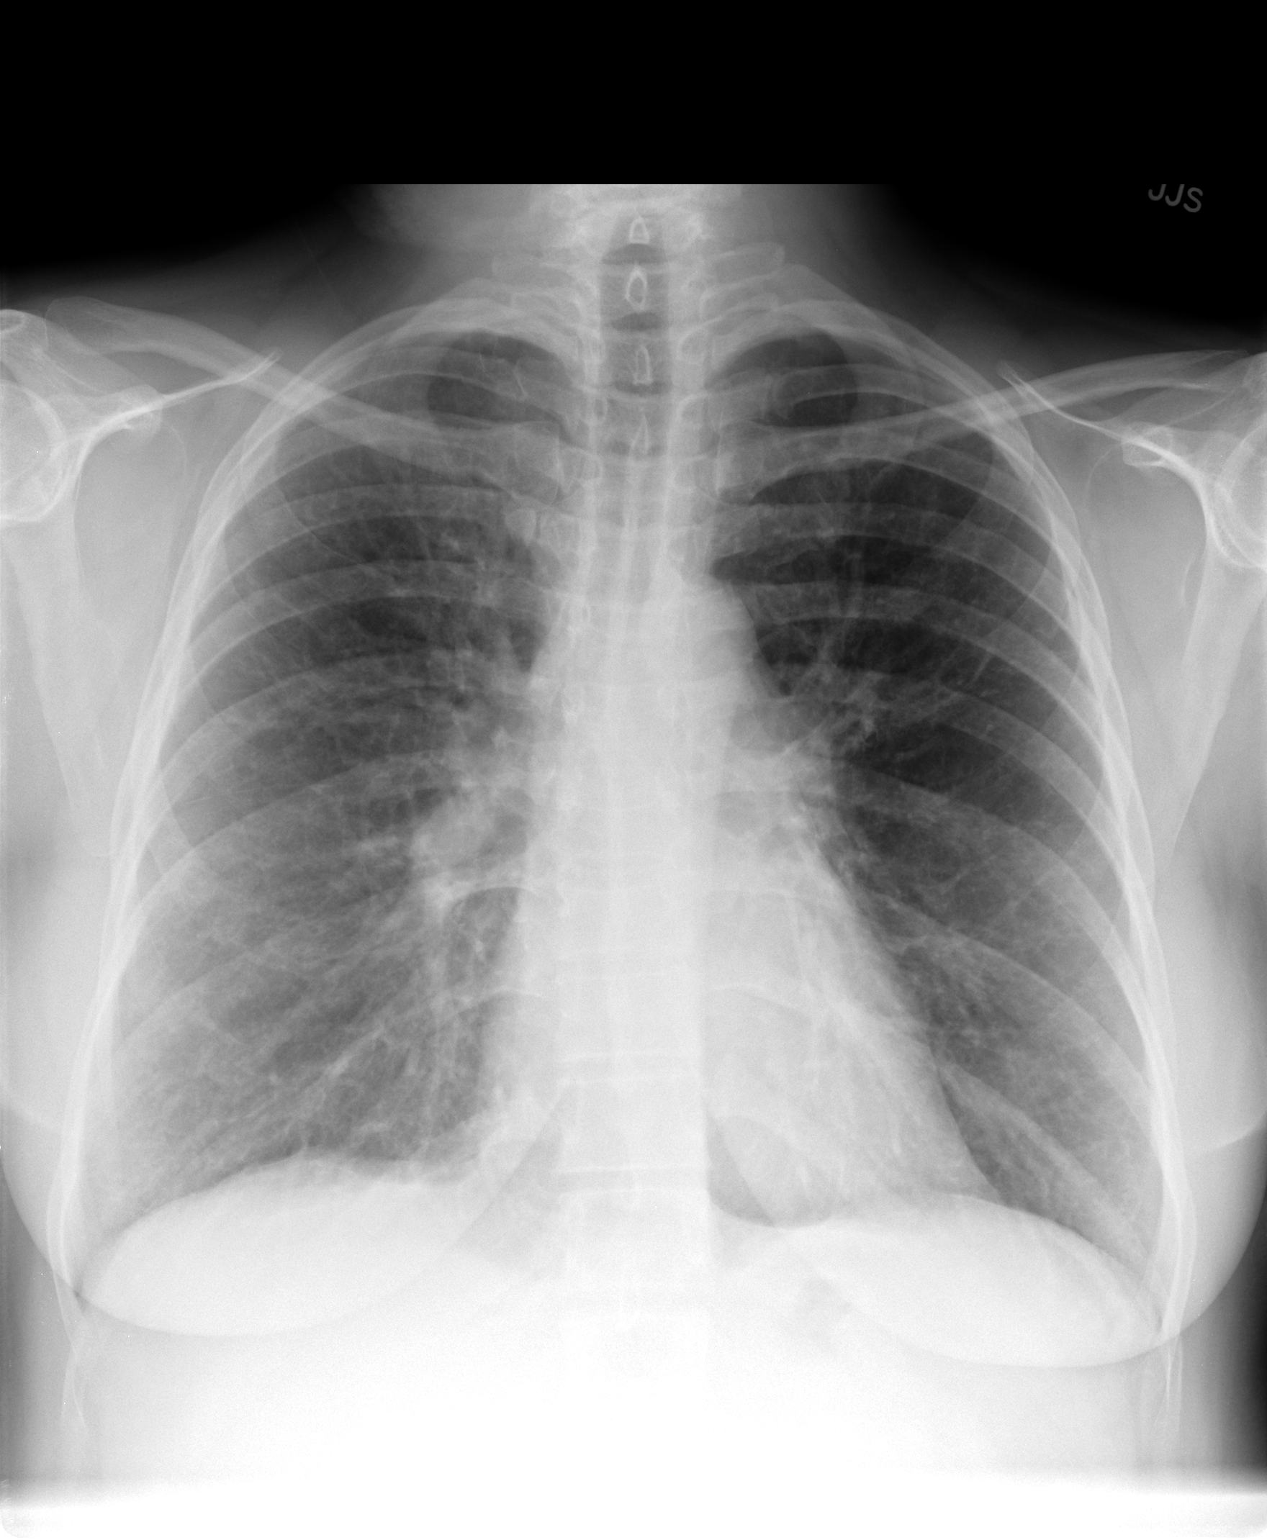

[view not recorded (2 of 2)]
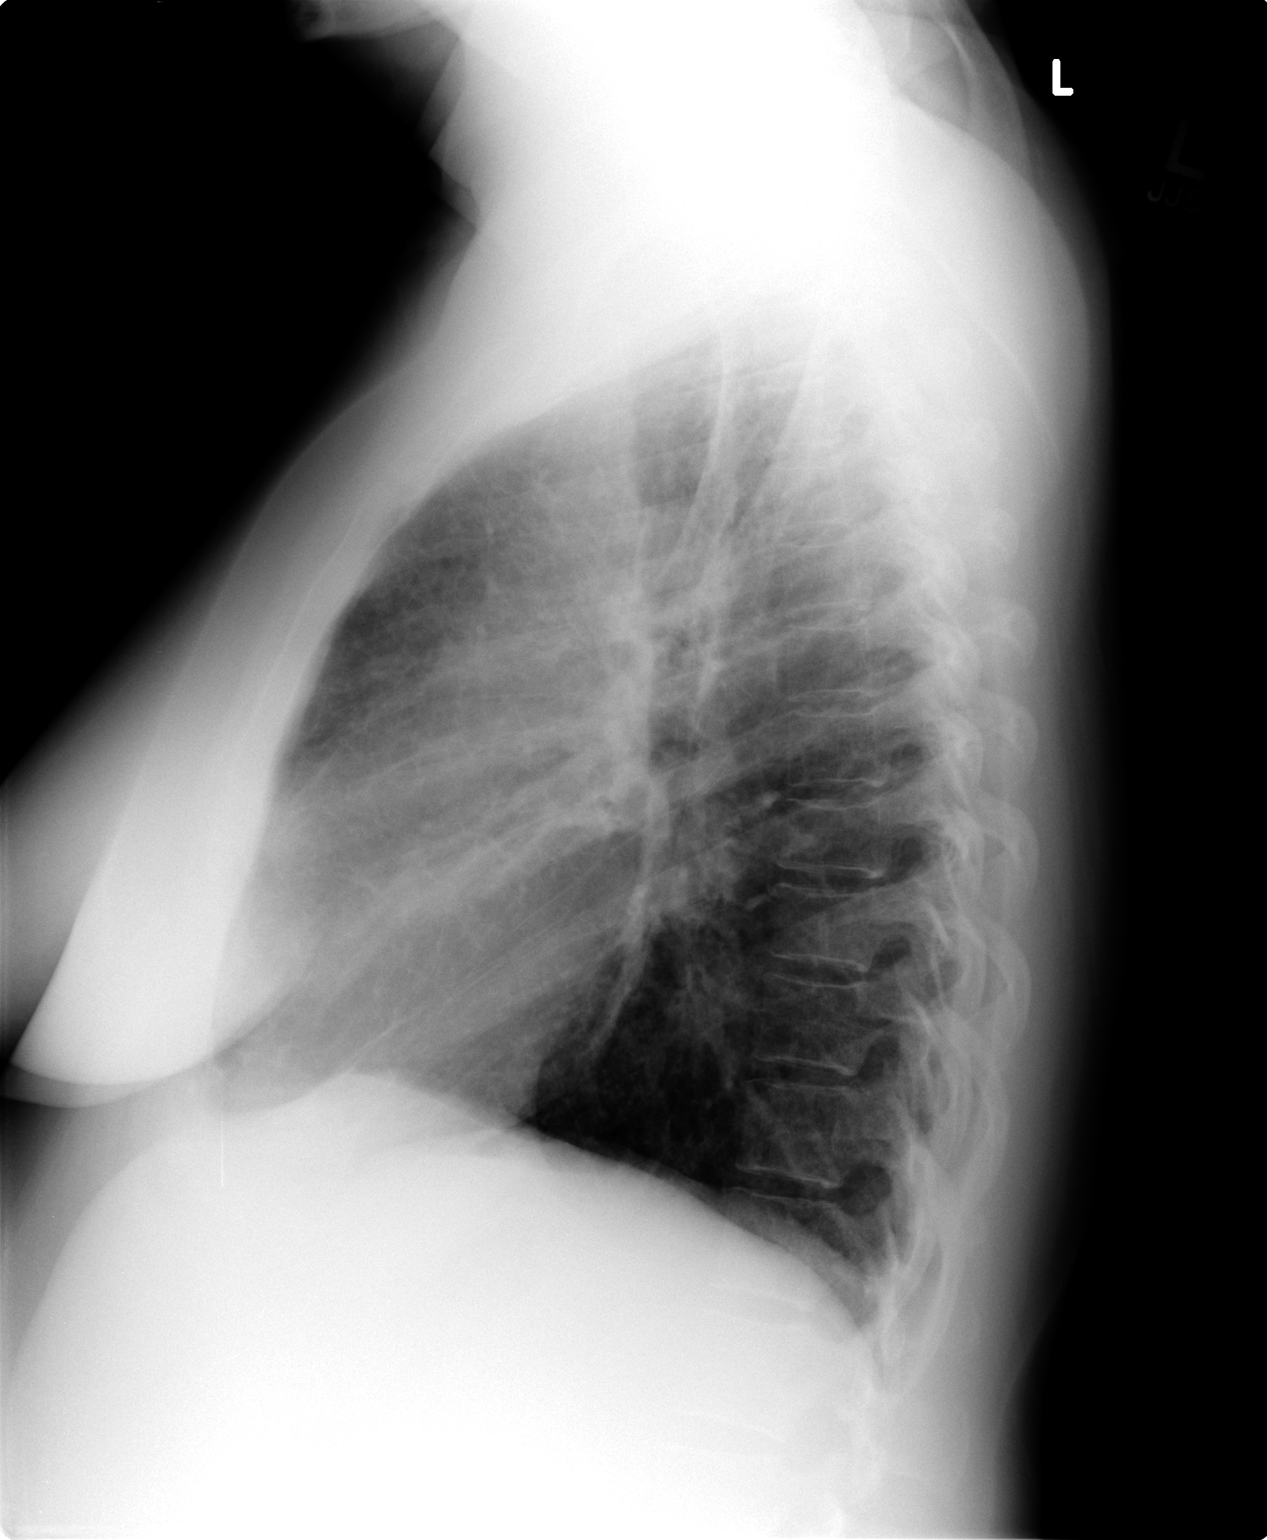

[2 of 2 positions shown; findings below may reference images not displayed]

FINDINGS: Peribronchial thickening is noted consistent with chronic
bronchitis.  No active infiltrate or effusion is seen.  Mediastinal
contours are stable.  The heart is within normal limits in size.
No bony abnormality is seen.
IMPRESSION: Stable chronic bronchitis.  No active lung disease.

## 2011-10-09 ENCOUNTER — Encounter: Payer: Self-pay | Admitting: Emergency Medicine

## 2011-10-09 ENCOUNTER — Telehealth: Payer: Self-pay | Admitting: Emergency Medicine

## 2011-10-09 ENCOUNTER — Ambulatory Visit (INDEPENDENT_AMBULATORY_CARE_PROVIDER_SITE_OTHER): Payer: Medicaid Other | Admitting: Emergency Medicine

## 2011-10-09 VITALS — BP 98/56 | HR 104 | Temp 98.6°F | Ht 61.0 in | Wt 181.6 lb

## 2011-10-09 DIAGNOSIS — J449 Chronic obstructive pulmonary disease, unspecified: Secondary | ICD-10-CM

## 2011-10-09 MED ORDER — AZITHROMYCIN 250 MG PO TABS: ORAL_TABLET | ORAL | Status: AC

## 2011-10-09 MED ORDER — PROMETHAZINE HCL 25 MG PO TABS: 25.0000 mg | ORAL_TABLET | ORAL | Status: DC | PRN

## 2011-10-09 MED ORDER — FLUCONAZOLE 100 MG PO TABS: 100.0000 mg | ORAL_TABLET | Freq: Every day | ORAL | Status: AC

## 2011-10-09 MED ORDER — PREDNISONE 10 MG PO TABS: ORAL_TABLET | ORAL | Status: DC

## 2011-10-09 NOTE — Patient Instructions (Signed)
Please continue your Spiriva and Advair as you are taking them We will call in prednisone, azithromycin and fluconazole today CONGRATULATIONS on stopping Cigarettes!! Follow with Dr Delton Coombes in 1 month Call our office if you are having any problems.

## 2011-10-09 NOTE — Progress Notes (Signed)
  Subjective:    Patient ID: [name], female    DOB: 09-22-1966, 45 y.o.   MRN: [id number]  HPI 45 yo woman, heavy smoker, treated 1-2/11 for necrotic PNA and ARDS, resp failure. Also w hx substance abuse, narcotics use for back pain and Bipolar disorder  03/19/2011 Hospital follow up  PT returns today for hospital follow up. She was admitted 4/5-4/11/12 for AECOPD, Acute on chronic resp failure and Pneumonia-Hospital associated with septic shock. She did require vent support /5-4/7 (self extubated). Tx with broad spectrum abx., IV steroids, and nebs. CXR showed right >left aspdz (basilar). Pan cultures were neg. Initially required pressor support and fluid challenge with good response and was weaned off. She was discharged on steroid taper. She is still smoking and has recurrent hospitalizations. Says she is trying to quit, no cigs for few days.  CXR follow up on 4/11 showed resolved infiltrate.   Since discharge, she is feeling better but very tired. Has finished prednisone taper. Taking Advair Twice daily   And Spiriva daily.  Wheezing has resolved. Cough is better mainly dry.   ROV 04/29/11 -- severe COPD, tobacco abuse, recent admit for CAP and sepsis. Has been managed on Advair and Spiriva.  She has done OK since d/c and last visit, no Pred or abx or hosp since last time. She has been active, does have to walk if she gets in a rush. She can't walk through [place] but does walk thru the grocery store. Some cough in the am, prod of white phlegm. She is smoking 1/2 pk/day (down from 2pk a day). She is interested in stopping.   ROV 07/03/11 -- severe COPD, tobacco use. Regular f/u visit. Just treated by PCP with pred taper for low back pain (DJD, disc dz). The pred helped w some of her chest tightness. On Advair + Spiriva, using SABA about 1 -2 x a day. She has cut her cigarettes down significantly, using nicotine patches.    ROV 10/08/11 -- severe COPD, tobacco use. Hx of necrotizing PNA in the  past, freq hospitalizations. She began to have congestion and HA, URI sx about a weeks ago. She quit smoking end of September!!     Objective:   Physical Exam GEN: A/Ox3; pleasant , NAD, obese   HEENT:  [place]/AT,  EACs-clear, TMs-wnl, NOSE-clear drainage , THROAT-clear, no lesions, no postnasal drip or exudate noted.   NECK:  Supple w/ fair ROM; no JVD; normal carotid impulses w/o bruits; no thyromegaly or nodules palpated; no lymphadenopathy.  RESP increased low-pitched B wheezes  CARD:  RRR, no m/r/g  , no peripheral edema, pulses intact, no cyanosis or clubbing.  Musco: Warm bil, no deformities or joint swelling noted.   Neuro: alert, no focal deficits noted.    Skin: Warm, no lesions or rashes   Assessment & Plan:  COPD - continue Advair + Spiriva + SABA - treat for acute exacerbation in setting URI - congratulated tobacco cessation - rov 1 month

## 2011-10-09 NOTE — Assessment & Plan Note (Signed)
-   continue Advair + Spiriva + SABA - treat for acute exacerbation in setting URI - congratulated tobacco cessation - rov 1 month

## 2011-10-09 NOTE — Telephone Encounter (Signed)
I informed pt of MW's findings and recommendations. Pt verbalized understanding. Rx sent to pharmacy.

## 2011-10-09 NOTE — Telephone Encounter (Signed)
Pt seen by RB this AM. C/o nausea with vomiting x 1 week and forgot to mention it during visit. Pt states is not pregnant. Requesting Rx called in. Is aware RB is out of the office and doc of the day will need to address same. Pt is okay with that and states has used Phenergan in the past with results. Please advise, thanks.

## 2011-10-09 NOTE — Telephone Encounter (Signed)
Phenergan 25 mg tablets #12  One every 4 hours but to ER if not able to keep liquids down as she is diabetic and this is risky for dehydration/ worsening dm

## 2011-10-14 ENCOUNTER — Other Ambulatory Visit: Payer: Self-pay | Admitting: *Deleted

## 2011-10-14 MED ORDER — FLUTICASONE-SALMETEROL 250-50 MCG/DOSE IN AEPB: 1.0000 | INHALATION_SPRAY | Freq: Two times a day (BID) | RESPIRATORY_TRACT | Status: DC

## 2011-10-14 NOTE — Telephone Encounter (Signed)
Received faxed refill request for Advair 250 from [facility].

## 2011-10-24 ENCOUNTER — Other Ambulatory Visit (HOSPITAL_COMMUNITY): Payer: Self-pay | Admitting: Psychiatry

## 2011-10-30 ENCOUNTER — Ambulatory Visit (INDEPENDENT_AMBULATORY_CARE_PROVIDER_SITE_OTHER): Payer: Medicaid Other | Admitting: Adult Health

## 2011-10-30 ENCOUNTER — Encounter: Payer: Self-pay | Admitting: Adult Health

## 2011-10-30 VITALS — BP 130/78 | HR 99 | Temp 99.4°F | Ht 61.0 in | Wt 180.6 lb

## 2011-10-30 DIAGNOSIS — J449 Chronic obstructive pulmonary disease, unspecified: Secondary | ICD-10-CM

## 2011-10-30 MED ORDER — PREDNISONE 10 MG PO TABS: ORAL_TABLET | ORAL | Status: DC

## 2011-10-30 MED ORDER — LEVOFLOXACIN 500 MG PO TABS: 500.0000 mg | ORAL_TABLET | Freq: Every day | ORAL | Status: AC

## 2011-10-30 MED ORDER — PROMETHAZINE HCL 25 MG PO TABS: 25.0000 mg | ORAL_TABLET | Freq: Four times a day (QID) | ORAL | Status: DC | PRN

## 2011-10-30 NOTE — Progress Notes (Signed)
Subjective:    Patient ID: [name], female    DOB: [birth year], 45 y.o.   MRN: [id number]  HPI 45 yo woman, heavy smoker, treated 1-2/11 for necrotic PNA and ARDS, resp failure. Also w hx substance abuse, narcotics use for back pain and Bipolar disorder  03/19/2011 Hospital follow up  PT returns today for hospital follow up. She was admitted 4/5-4/11/12 for AECOPD, Acute on chronic resp failure and Pneumonia-Hospital associated with septic shock. She did require vent support /5-4/7 (self extubated). Tx with broad spectrum abx., IV steroids, and nebs. CXR showed right >left aspdz (basilar). Pan cultures were neg. Initially required pressor support and fluid challenge with good response and was weaned off. She was discharged on steroid taper. She is still smoking and has recurrent hospitalizations. Says she is trying to quit, no cigs for few days.  CXR follow up on 4/11 showed resolved infiltrate.   Since discharge, she is feeling better but very tired. Has finished prednisone taper. Taking Advair Twice daily   And Spiriva daily.  Wheezing has resolved. Cough is better mainly dry.   ROV 04/29/11 -- severe COPD, tobacco abuse, recent admit for CAP and sepsis. Has been managed on Advair and Spiriva.  She has done OK since d/c and last visit, no Pred or abx or hosp since last time. She has been active, does have to walk if she gets in a rush. She can't walk through [place] but does walk thru the grocery store. Some cough in the am, prod of white phlegm. She is smoking 1/2 pk/day (down from 2pk a day). She is interested in stopping.   ROV 07/03/11 -- severe COPD, tobacco use. Regular f/u visit. Just treated by PCP with pred taper for low back pain (DJD, disc dz). The pred helped w some of her chest tightness. On Advair + Spiriva, using SABA about 1 -2 x a day. She has cut her cigarettes down significantly, using nicotine patches.    ROV 10/08/11 -- severe COPD, tobacco use. Hx of necrotizing PNA in the  past, freq hospitalizations. She began to have congestion and HA, URI sx about a weeks ago. She quit smoking end of September!!   Acute OV 10/30/2011 Complains of productive cough with clear and yellow and congestion for 5 days with increased SOB, wheezing . OTC not helping. No hemoptysis . Has a lot of sinus congestion . No ER or hosp. Admits since last ov.  Has not smoked for 2 months. Doing well on Spiriva and Symbicort.   ROS:  Constitutional:   No  weight loss, night sweats,  Fevers, chills,  +=fatigue, or  lassitude.  HEENT:   No headaches,  Difficulty swallowing,  Tooth/dental problems, or  Sore throat,                No sneezing, itching, ear ache,  ++nasal congestion, post nasal drip,   CV:  No chest pain,  Orthopnea, PND, swelling in lower extremities, anasarca, dizziness, palpitations, syncope.   GI  No heartburn, indigestion, abdominal pain, nausea, vomiting, diarrhea, change in bowel habits, loss of appetite, bloody stools.   Resp:   No coughing up of blood.   No chest wall deformity  Skin: no rash or lesions.  GU: no dysuria, change in color of urine, no urgency or frequency.  No flank pain, no hematuria   MS:  No joint pain or swelling.  No decreased range of motion.  No back pain.  Psych:  No change in  mood or affect. No depression or anxiety.  No memory loss.        Objective:   Physical Exam GEN: A/Ox3; pleasant , NAD, overweight   HEENT:  [place]/AT,  EACs-clear, TMs-wnl, NOSE-clear drainage , THROAT-clear, no lesions, no postnasal drip or exudate noted.   NECK:  Supple w/ fair ROM; no JVD; normal carotid impulses w/o bruits; no thyromegaly or nodules palpated; no lymphadenopathy.  RESP : coarse BS w/ faint exp wheezing   CARD:  RRR, no m/r/g  , no peripheral edema, pulses intact, no cyanosis or clubbing.  Musco: Warm bil, no deformities or joint swelling noted.   Neuro: alert, no focal deficits noted.    Skin: Warm, no lesions or rashes   Assessment  & Plan:

## 2011-10-30 NOTE — Assessment & Plan Note (Signed)
Flare  Plan:  Levaquin 500mg  daily for 7 days  Mucinex DM Twice daily  As needed  Cough/congesiton  Fluids and rest  Prednisone taper over next week  Please contact office for sooner follow up if symptoms do not improve or worsen or seek emergency care  follow up Dr. Delton Coombes  In 2 weeks as planned  .

## 2011-10-30 NOTE — Patient Instructions (Signed)
Levaquin 500mg  daily for 7 days  Mucinex DM Twice daily  As needed  Cough/congesiton  Fluids and rest  Prednisone taper over next week  Please contact office for sooner follow up if symptoms do not improve or worsen or seek emergency care  follow up Dr. Delton Coombes  In 2 weeks as planned  .

## 2011-11-09 ENCOUNTER — Ambulatory Visit: Payer: Medicaid Other | Admitting: Emergency Medicine

## 2011-11-16 IMAGING — CT CT ABD-PELV W/ CM
2 of 5 series · 17 of 46 positions shown, 19 images · IV contrast (Omnipaque 300)
Comparison: CT abdomen 02/09/2010

CLINICAL DATA: Abdominal pain and vaginal bleeding.

CT ABDOMEN AND PELVIS WITH CONTRAST
TECHNIQUE: Multidetector CT imaging of the abdomen and pelvis was
performed following the standard protocol during bolus
administration of intravenous contrast.
Contrast: 100 ml of Fmnipaque-ZNN

[Series 2: abd_pel_with 5.0 b40f · axial · 0.71mm/px · z∈[-454,-94]mm · 14 of 82 slices shown, 16 images]
[im 5/82  soft-tissue]
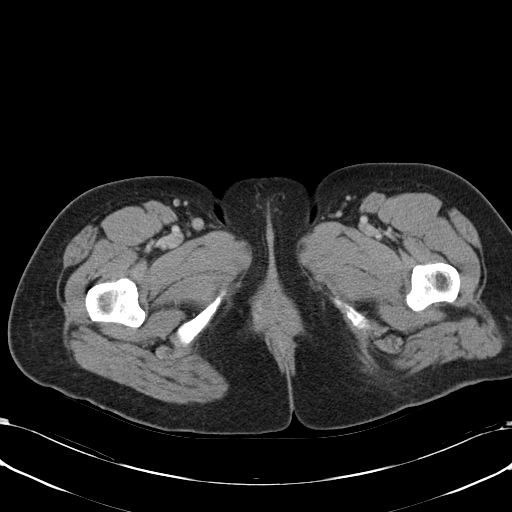
[im 5/82  bone]
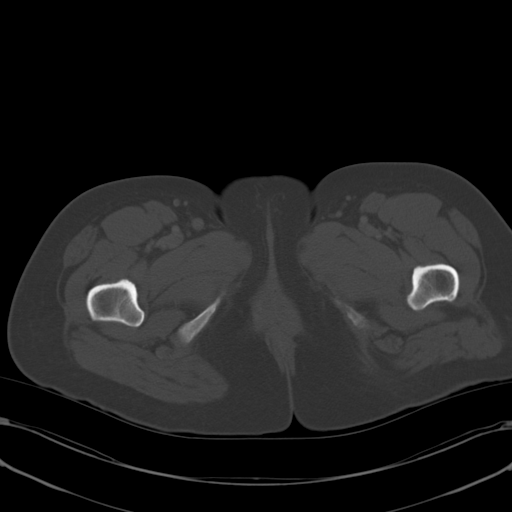
[im 10/82  soft-tissue]
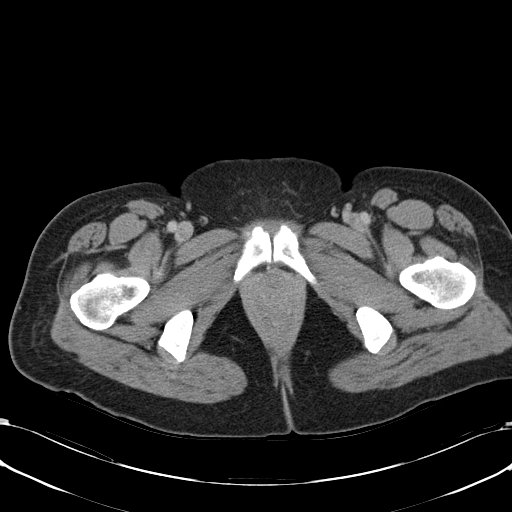
[im 19/82  soft-tissue]
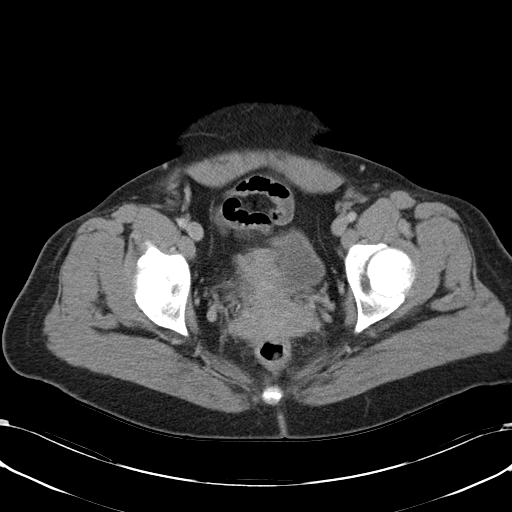
[im 23/82  soft-tissue]
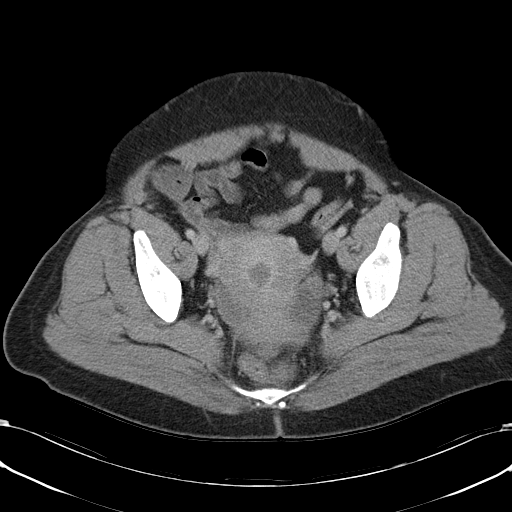
[im 28/82  soft-tissue]
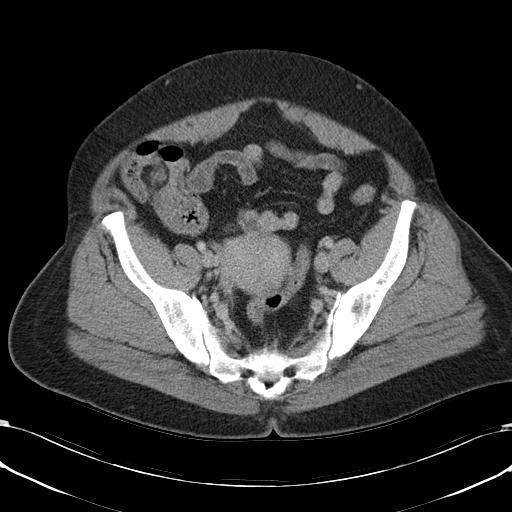
[im 32/82  soft-tissue]
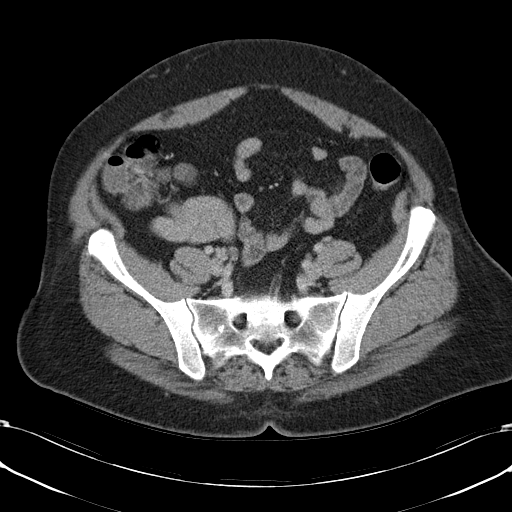
[im 37/82  soft-tissue]
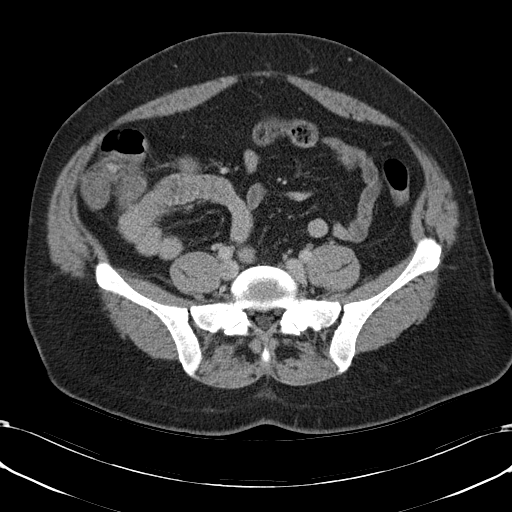
[im 46/82  soft-tissue]
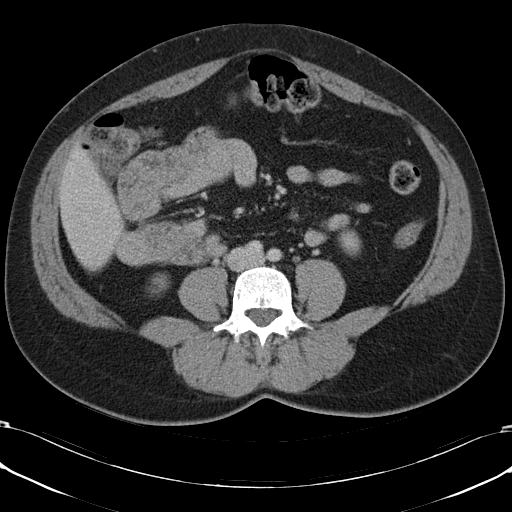
[im 50/82  soft-tissue]
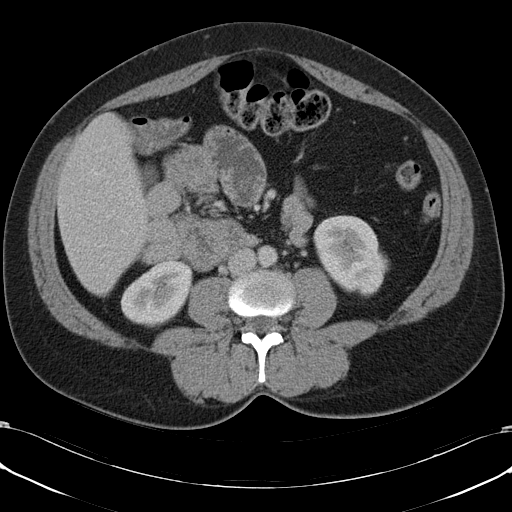
[im 50/82  bone]
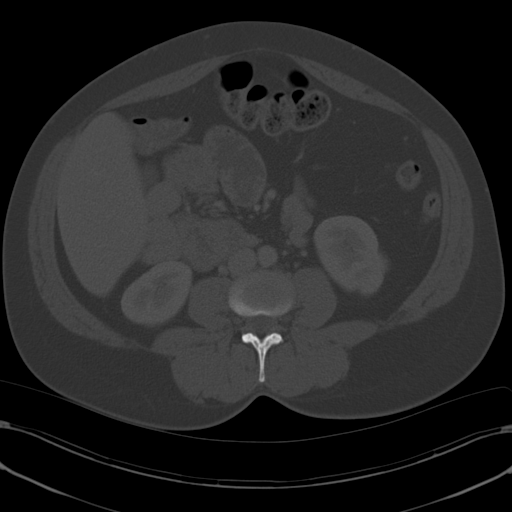
[im 55/82  soft-tissue]
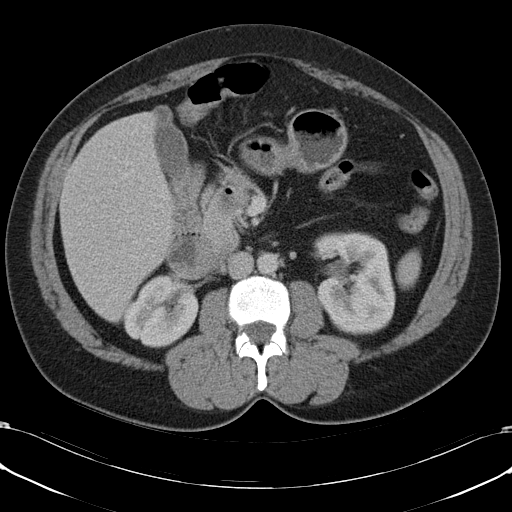
[im 59/82  soft-tissue]
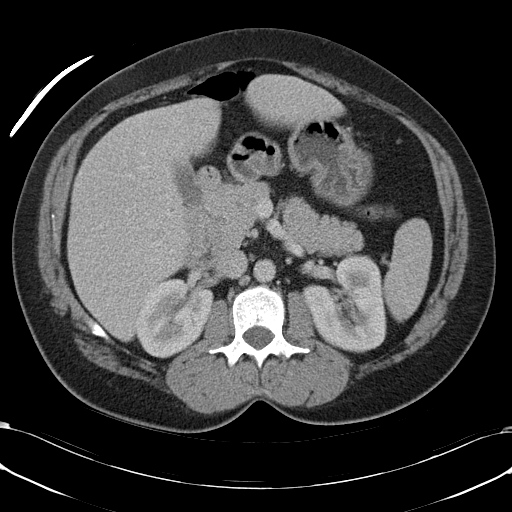
[im 64/82  soft-tissue]
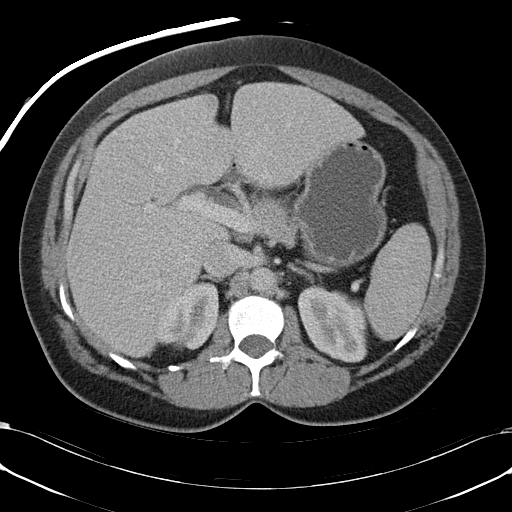
[im 73/82  soft-tissue]
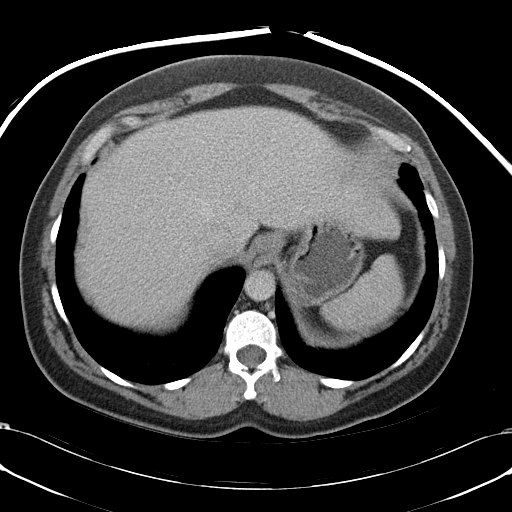
[im 77/82  soft-tissue]
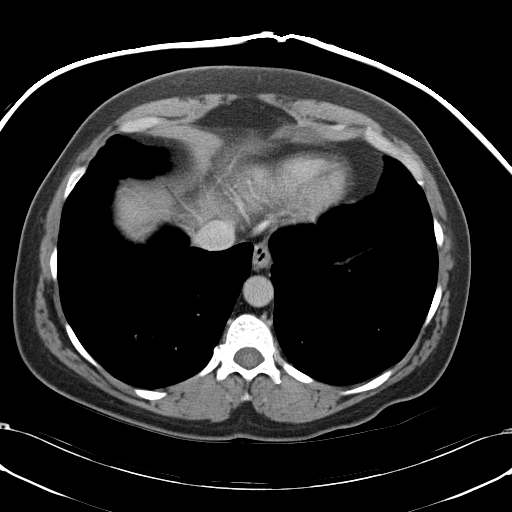

[Series 4: abd_pel_with 3.0 spo cor · coronal · 0.70mm/px · 3 of 86 slices shown]
[im 29/86  soft-tissue]
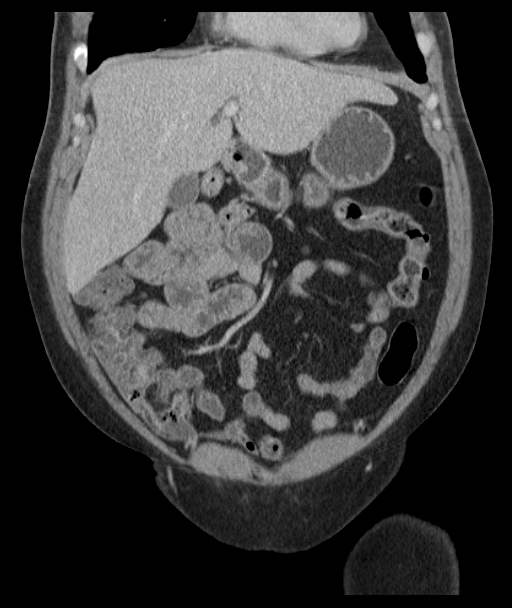
[im 38/86  soft-tissue]
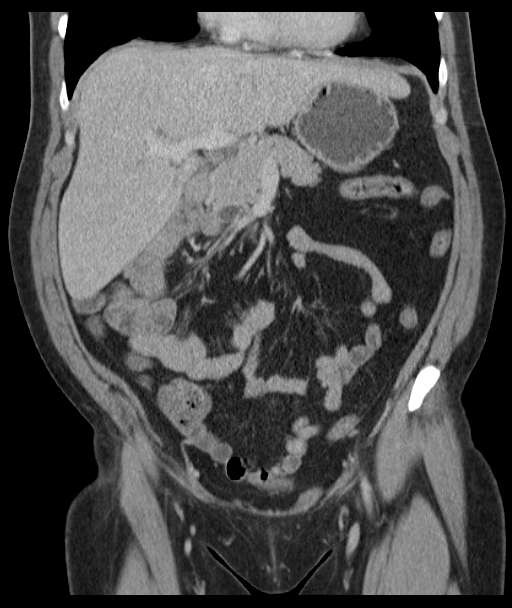
[im 48/86  soft-tissue]
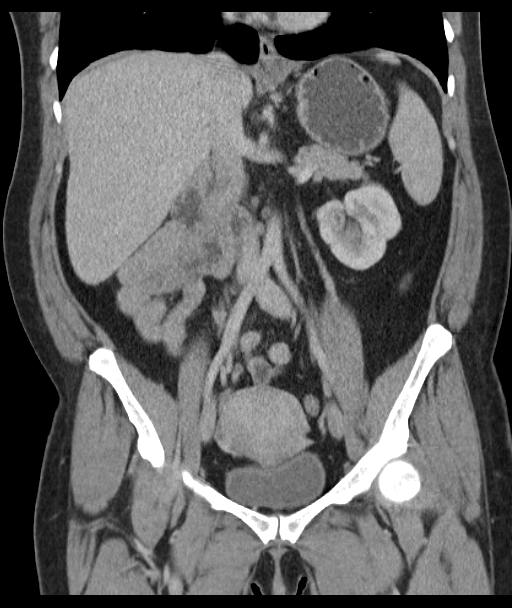

[17 of 46 positions shown; findings below may reference images not displayed]

FINDINGS: The lung bases are clear.

The liver is unremarkable and stable.  Minimal central intrahepatic
biliary dilatation, improved since prior study.  No obvious
pancreatic head mass or common bile duct stone.  The gallbladder
appears normal.  The spleen is normal in size.  The adrenal glands
and kidneys are unremarkable and stable.

The stomach, duodenum, small bowel and colon demonstrate no
significant findings.  The appendix is not visualized for certain
but I do not see any findings to suggest appendicitis.  The uterus
and ovaries are unremarkable except for a small ovarian cysts.  No
pelvic mass or adenopathy.  No inguinal mass or hernia.

The bony structures are unremarkable.
IMPRESSION: No acute abdominal/pelvic findings, mass lesions or adenopathy.

## 2011-11-18 ENCOUNTER — Telehealth: Payer: Self-pay | Admitting: Emergency Medicine

## 2011-11-18 MED ORDER — TIOTROPIUM BROMIDE MONOHYDRATE 18 MCG IN CAPS: 18.0000 ug | ORAL_CAPSULE | Freq: Every day | RESPIRATORY_TRACT | Status: DC

## 2011-11-18 NOTE — Telephone Encounter (Signed)
Pt's son aware rx has been sent to the pharmacy and will notify the patient of this.

## 2011-11-20 ENCOUNTER — Inpatient Hospital Stay (HOSPITAL_COMMUNITY)
Admission: EM | Admit: 2011-11-20 | Discharge: 2011-11-23 | DRG: 191 | Disposition: A | Discharge status: Home / Self Care | Payer: Medicaid Other | Attending: Internal Medicine | Admitting: Internal Medicine

## 2011-11-20 ENCOUNTER — Encounter (HOSPITAL_COMMUNITY): Payer: Self-pay | Admitting: Emergency Medicine

## 2011-11-20 ENCOUNTER — Emergency Department (HOSPITAL_COMMUNITY): Payer: Medicaid Other

## 2011-11-20 DIAGNOSIS — E119 Type 2 diabetes mellitus without complications: Secondary | ICD-10-CM | POA: Diagnosis present

## 2011-11-20 DIAGNOSIS — M545 Low back pain, unspecified: Secondary | ICD-10-CM | POA: Diagnosis present

## 2011-11-20 DIAGNOSIS — J449 Chronic obstructive pulmonary disease, unspecified: Secondary | ICD-10-CM | POA: Diagnosis present

## 2011-11-20 DIAGNOSIS — F17200 Nicotine dependence, unspecified, uncomplicated: Secondary | ICD-10-CM

## 2011-11-20 DIAGNOSIS — B370 Candidal stomatitis: Secondary | ICD-10-CM

## 2011-11-20 DIAGNOSIS — J40 Bronchitis, not specified as acute or chronic: Secondary | ICD-10-CM

## 2011-11-20 DIAGNOSIS — Z6834 Body mass index (BMI) 34.0-34.9, adult: Secondary | ICD-10-CM

## 2011-11-20 DIAGNOSIS — M62838 Other muscle spasm: Secondary | ICD-10-CM

## 2011-11-20 DIAGNOSIS — B349 Viral infection, unspecified: Secondary | ICD-10-CM

## 2011-11-20 DIAGNOSIS — E639 Nutritional deficiency, unspecified: Secondary | ICD-10-CM

## 2011-11-20 DIAGNOSIS — J984 Other disorders of lung: Secondary | ICD-10-CM

## 2011-11-20 DIAGNOSIS — R000 Tachycardia, unspecified: Secondary | ICD-10-CM

## 2011-11-20 DIAGNOSIS — G43909 Migraine, unspecified, not intractable, without status migrainosus: Secondary | ICD-10-CM | POA: Diagnosis present

## 2011-11-20 DIAGNOSIS — J441 Chronic obstructive pulmonary disease with (acute) exacerbation: Principal | ICD-10-CM | POA: Diagnosis present

## 2011-11-20 DIAGNOSIS — F331 Major depressive disorder, recurrent, moderate: Secondary | ICD-10-CM | POA: Diagnosis present

## 2011-11-20 DIAGNOSIS — R404 Transient alteration of awareness: Secondary | ICD-10-CM

## 2011-11-20 DIAGNOSIS — F3132 Bipolar disorder, current episode depressed, moderate: Secondary | ICD-10-CM | POA: Diagnosis present

## 2011-11-20 DIAGNOSIS — F1910 Other psychoactive substance abuse, uncomplicated: Secondary | ICD-10-CM

## 2011-11-20 DIAGNOSIS — G8929 Other chronic pain: Secondary | ICD-10-CM | POA: Diagnosis present

## 2011-11-20 NOTE — ED Notes (Signed)
PT. REPORTS SOB WITH PRODUCTIVE COUGH , NAUSEA , NO VOMITTING ,  CHILLS WITH LOW GRADE FEVER .  BODY ACHES .

## 2011-11-21 ENCOUNTER — Encounter (HOSPITAL_COMMUNITY): Payer: Self-pay

## 2011-11-21 ENCOUNTER — Other Ambulatory Visit: Payer: Self-pay

## 2011-11-21 DIAGNOSIS — E119 Type 2 diabetes mellitus without complications: Secondary | ICD-10-CM | POA: Diagnosis present

## 2011-11-21 DIAGNOSIS — B349 Viral infection, unspecified: Secondary | ICD-10-CM

## 2011-11-21 LAB — POCT I-STAT, CHEM 8
BUN: 8 mg/dL (ref 6–23)
Chloride: 100 mEq/L (ref 96–112)
HCT: 40 % (ref 36.0–46.0)
Potassium: 3.6 mEq/L (ref 3.5–5.1)
Sodium: 139 mEq/L (ref 135–145)

## 2011-11-21 LAB — CBC
HCT: 35.8 % — ABNORMAL LOW (ref 36.0–46.0)
MCHC: 31.8 g/dL (ref 30.0–36.0)
MCV: 85.1 fL (ref 78.0–100.0)
MCV: 85.9 fL (ref 78.0–100.0)
Platelets: 403 10*3/uL — ABNORMAL HIGH (ref 150–400)
Platelets: 361 10*3/uL (ref 150–400)
RBC: 4.51 MIL/uL (ref 3.87–5.11)
RDW: 14 % (ref 11.5–15.5)
WBC: 25.7 10*3/uL — ABNORMAL HIGH (ref 4.0–10.5)
WBC: 19.5 10*3/uL — ABNORMAL HIGH (ref 4.0–10.5)

## 2011-11-21 LAB — DIFFERENTIAL
Basophils Absolute: 0 10*3/uL (ref 0.0–0.1)
Eosinophils Relative: 0 % (ref 0–5)
Lymphocytes Relative: 10 % — ABNORMAL LOW (ref 12–46)
Lymphs Abs: 2.6 10*3/uL (ref 0.7–4.0)
Monocytes Relative: 7 % (ref 3–12)
Neutrophils Relative %: 83 % — ABNORMAL HIGH (ref 43–77)

## 2011-11-21 LAB — GLUCOSE, CAPILLARY
Glucose-Capillary: 201 mg/dL — ABNORMAL HIGH (ref 70–99)
Glucose-Capillary: 285 mg/dL — ABNORMAL HIGH (ref 70–99)
Glucose-Capillary: 257 mg/dL — ABNORMAL HIGH (ref 70–99)

## 2011-11-21 LAB — HEMOGLOBIN A1C: Mean Plasma Glucose: 117 mg/dL — ABNORMAL HIGH (ref <117)

## 2011-11-21 LAB — POCT I-STAT TROPONIN I: Troponin i, poc: 0 ng/mL (ref 0.00–0.08)

## 2011-11-21 LAB — CREATININE, SERUM: GFR calc Af Amer: >90 mL/min (ref >90)

## 2011-11-21 MED ORDER — TIZANIDINE HCL 4 MG PO TABS
4.0000 mg | ORAL_TABLET | Freq: Three times a day (TID) | ORAL | Status: DC
  Administered 2011-11-21 – 2011-11-23 (×6): 4 mg via ORAL
  Filled 2011-11-21 (×10): qty 1

## 2011-11-21 MED ORDER — SODIUM CHLORIDE 0.9 % IV SOLN: 250.0000 mL | INTRAVENOUS | Status: DC | PRN

## 2011-11-21 MED ORDER — SODIUM CHLORIDE 0.9 % IJ SOLN: 3.0000 mL | INTRAMUSCULAR | Status: DC | PRN

## 2011-11-21 MED ORDER — OSELTAMIVIR PHOSPHATE 75 MG PO CAPS
75.0000 mg | ORAL_CAPSULE | Freq: Two times a day (BID) | ORAL | Status: DC
  Administered 2011-11-21 – 2011-11-23 (×4): 75 mg via ORAL
  Filled 2011-11-21 (×6): qty 1

## 2011-11-21 MED ORDER — METFORMIN HCL 500 MG PO TABS
500.0000 mg | ORAL_TABLET | Freq: Two times a day (BID) | ORAL | Status: DC
  Administered 2011-11-21 – 2011-11-22 (×3): 500 mg via ORAL
  Filled 2011-11-21 (×6): qty 1

## 2011-11-21 MED ORDER — MORPHINE SULFATE 2 MG/ML IJ SOLN
2.0000 mg | INTRAMUSCULAR | Status: DC | PRN
  Administered 2011-11-22 – 2011-11-23 (×4): 2 mg via INTRAVENOUS
  Filled 2011-11-21 (×4): qty 1

## 2011-11-21 MED ORDER — OXYCODONE HCL 5 MG PO TABS
5.0000 mg | ORAL_TABLET | ORAL | Status: DC | PRN
  Administered 2011-11-21 – 2011-11-23 (×9): 5 mg via ORAL
  Filled 2011-11-21 (×9): qty 1

## 2011-11-21 MED ORDER — ARIPIPRAZOLE 5 MG PO TABS
5.0000 mg | ORAL_TABLET | Freq: Once | ORAL | Status: AC
  Administered 2011-11-21: 5 mg via ORAL
  Filled 2011-11-21: qty 1

## 2011-11-21 MED ORDER — DOCUSATE SODIUM 100 MG PO CAPS
100.0000 mg | ORAL_CAPSULE | Freq: Two times a day (BID) | ORAL | Status: DC
  Administered 2011-11-22 – 2011-11-23 (×2): 100 mg via ORAL
  Filled 2011-11-21 (×6): qty 1

## 2011-11-21 MED ORDER — FLUTICASONE-SALMETEROL 250-50 MCG/DOSE IN AEPB
1.0000 | INHALATION_SPRAY | Freq: Two times a day (BID) | RESPIRATORY_TRACT | Status: DC
  Administered 2011-11-21 – 2011-11-23 (×5): 1 via RESPIRATORY_TRACT
  Filled 2011-11-21: qty 14

## 2011-11-21 MED ORDER — GLYBURIDE 5 MG PO TABS
5.0000 mg | ORAL_TABLET | Freq: Two times a day (BID) | ORAL | Status: DC
  Administered 2011-11-21 – 2011-11-23 (×4): 5 mg via ORAL
  Filled 2011-11-21 (×6): qty 1

## 2011-11-21 MED ORDER — TIOTROPIUM BROMIDE MONOHYDRATE 18 MCG IN CAPS
18.0000 ug | ORAL_CAPSULE | Freq: Every day | RESPIRATORY_TRACT | Status: DC
  Administered 2011-11-22 – 2011-11-23 (×2): 18 ug via RESPIRATORY_TRACT
  Filled 2011-11-21: qty 5

## 2011-11-21 MED ORDER — ONDANSETRON HCL 4 MG/2ML IJ SOLN
INTRAMUSCULAR | Status: AC
  Administered 2011-11-21: 4 mg via INTRAVENOUS
  Filled 2011-11-21: qty 2

## 2011-11-21 MED ORDER — METHYLPREDNISOLONE SODIUM SUCC 125 MG IJ SOLR
60.0000 mg | Freq: Four times a day (QID) | INTRAMUSCULAR | Status: DC
  Administered 2011-11-21: 09:00:00 via INTRAVENOUS
  Administered 2011-11-21 – 2011-11-22 (×5): 60 mg via INTRAVENOUS
  Filled 2011-11-21 (×8): qty 2

## 2011-11-21 MED ORDER — FLUTICASONE-SALMETEROL 250-50 MCG/DOSE IN AEPB: 1.0000 | INHALATION_SPRAY | Freq: Two times a day (BID) | RESPIRATORY_TRACT | Status: DC

## 2011-11-21 MED ORDER — ONDANSETRON HCL 4 MG/2ML IJ SOLN
INTRAMUSCULAR | Status: AC
  Administered 2011-11-21: 4 mg
  Filled 2011-11-21: qty 2

## 2011-11-21 MED ORDER — INSULIN ASPART 100 UNIT/ML ~~LOC~~ SOLN
0.0000 [IU] | Freq: Three times a day (TID) | SUBCUTANEOUS | Status: DC
  Administered 2011-11-21: 4 [IU] via SUBCUTANEOUS
  Administered 2011-11-21: 7 [IU] via SUBCUTANEOUS
  Administered 2011-11-22 (×2): 4 [IU] via SUBCUTANEOUS
  Administered 2011-11-22: 7 [IU] via SUBCUTANEOUS
  Filled 2011-11-21: qty 3
  Filled 2011-11-21: qty 2

## 2011-11-21 MED ORDER — TIZANIDINE HCL 4 MG PO TABS
4.0000 mg | ORAL_TABLET | Freq: Three times a day (TID) | ORAL | Status: DC
  Filled 2011-11-21 (×3): qty 1

## 2011-11-21 MED ORDER — FENOFIBRATE 160 MG PO TABS
160.0000 mg | ORAL_TABLET | Freq: Once | ORAL | Status: AC
  Administered 2011-11-21: 160 mg via ORAL
  Filled 2011-11-21: qty 1

## 2011-11-21 MED ORDER — ARIPIPRAZOLE 5 MG PO TABS
5.0000 mg | ORAL_TABLET | Freq: Every day | ORAL | Status: DC
  Administered 2011-11-22 – 2011-11-23 (×2): 5 mg via ORAL
  Filled 2011-11-21 (×3): qty 1

## 2011-11-21 MED ORDER — GLYBURIDE-METFORMIN 5-500 MG PO TABS: 1.0000 | ORAL_TABLET | Freq: Two times a day (BID) | ORAL | Status: DC

## 2011-11-21 MED ORDER — INSULIN ASPART 100 UNIT/ML ~~LOC~~ SOLN
6.0000 [IU] | Freq: Three times a day (TID) | SUBCUTANEOUS | Status: DC
  Administered 2011-11-22 – 2011-11-23 (×3): 6 [IU] via SUBCUTANEOUS

## 2011-11-21 MED ORDER — INSULIN GLARGINE 100 UNIT/ML ~~LOC~~ SOLN
10.0000 [IU] | Freq: Every day | SUBCUTANEOUS | Status: DC
  Administered 2011-11-21: 10 [IU] via SUBCUTANEOUS
  Filled 2011-11-21: qty 3

## 2011-11-21 MED ORDER — ALBUTEROL SULFATE (5 MG/ML) 0.5% IN NEBU
2.5000 mg | INHALATION_SOLUTION | Freq: Once | RESPIRATORY_TRACT | Status: AC
  Administered 2011-11-21: 2.5 mg via RESPIRATORY_TRACT
  Filled 2011-11-21: qty 0.5

## 2011-11-21 MED ORDER — SODIUM CHLORIDE 0.9 % IV BOLUS (SEPSIS)
1000.0000 mL | Freq: Once | INTRAVENOUS | Status: AC
  Administered 2011-11-21: 1000 mL via INTRAVENOUS

## 2011-11-21 MED ORDER — SODIUM CHLORIDE 0.9 % IJ SOLN
3.0000 mL | Freq: Two times a day (BID) | INTRAMUSCULAR | Status: DC
  Administered 2011-11-21 – 2011-11-22 (×4): 3 mL via INTRAVENOUS

## 2011-11-21 MED ORDER — METFORMIN HCL 500 MG PO TABS
500.0000 mg | ORAL_TABLET | Freq: Once | ORAL | Status: AC
  Administered 2011-11-23: 500 mg via ORAL
  Filled 2011-11-21: qty 1

## 2011-11-21 MED ORDER — MOXIFLOXACIN HCL 400 MG PO TABS
400.0000 mg | ORAL_TABLET | Freq: Once | ORAL | Status: AC
  Administered 2011-11-21: 400 mg via ORAL
  Filled 2011-11-21: qty 1

## 2011-11-21 MED ORDER — MOXIFLOXACIN HCL IN NACL 400 MG/250ML IV SOLN
400.0000 mg | INTRAVENOUS | Status: DC
  Administered 2011-11-21 – 2011-11-23 (×3): 400 mg via INTRAVENOUS
  Filled 2011-11-21 (×4): qty 250

## 2011-11-21 MED ORDER — GLYBURIDE 5 MG PO TABS
5.0000 mg | ORAL_TABLET | Freq: Once | ORAL | Status: AC
  Administered 2011-11-21: 5 mg via ORAL
  Filled 2011-11-21: qty 1

## 2011-11-21 MED ORDER — ONDANSETRON HCL 4 MG PO TABS: 4.0000 mg | ORAL_TABLET | Freq: Four times a day (QID) | ORAL | Status: DC | PRN

## 2011-11-21 MED ORDER — IPRATROPIUM BROMIDE 0.02 % IN SOLN
0.5000 mg | Freq: Once | RESPIRATORY_TRACT | Status: AC
  Administered 2011-11-21: 0.5 mg via RESPIRATORY_TRACT
  Filled 2011-11-21: qty 2.5

## 2011-11-21 MED ORDER — ALBUTEROL SULFATE (5 MG/ML) 0.5% IN NEBU
2.5000 mg | INHALATION_SOLUTION | Freq: Four times a day (QID) | RESPIRATORY_TRACT | Status: DC
  Administered 2011-11-22 – 2011-11-23 (×5): 2.5 mg via RESPIRATORY_TRACT
  Filled 2011-11-21 (×5): qty 0.5

## 2011-11-21 MED ORDER — VENLAFAXINE HCL 75 MG PO TABS
75.0000 mg | ORAL_TABLET | Freq: Every day | ORAL | Status: DC
  Administered 2011-11-21 – 2011-11-23 (×3): 75 mg via ORAL
  Filled 2011-11-21 (×3): qty 1

## 2011-11-21 MED ORDER — TIZANIDINE HCL 4 MG PO TABS
4.0000 mg | ORAL_TABLET | Freq: Once | ORAL | Status: DC
  Filled 2011-11-21: qty 1

## 2011-11-21 MED ORDER — FUROSEMIDE 20 MG PO TABS
20.0000 mg | ORAL_TABLET | Freq: Every day | ORAL | Status: DC
  Administered 2011-11-21 – 2011-11-23 (×3): 20 mg via ORAL
  Filled 2011-11-21 (×4): qty 1

## 2011-11-21 MED ORDER — INSULIN ASPART 100 UNIT/ML ~~LOC~~ SOLN
0.0000 [IU] | Freq: Every day | SUBCUTANEOUS | Status: DC
  Administered 2011-11-21: 3 [IU] via SUBCUTANEOUS
  Administered 2011-11-21: 2 [IU] via SUBCUTANEOUS

## 2011-11-21 MED ORDER — OXYCODONE-ACETAMINOPHEN 5-325 MG PO TABS
1.0000 | ORAL_TABLET | ORAL | Status: DC | PRN
  Administered 2011-11-21 – 2011-11-23 (×10): 1 via ORAL
  Filled 2011-11-21 (×10): qty 1

## 2011-11-21 MED ORDER — VENLAFAXINE HCL 75 MG PO TABS
75.0000 mg | ORAL_TABLET | Freq: Once | ORAL | Status: AC
  Administered 2011-11-21: 75 mg via ORAL
  Filled 2011-11-21: qty 1

## 2011-11-21 MED ORDER — METHYLPREDNISOLONE SODIUM SUCC 125 MG IJ SOLR
125.0000 mg | Freq: Once | INTRAMUSCULAR | Status: AC
  Administered 2011-11-21: 125 mg via INTRAVENOUS
  Filled 2011-11-21 (×2): qty 2

## 2011-11-21 MED ORDER — ALBUTEROL SULFATE (5 MG/ML) 0.5% IN NEBU
2.5000 mg | INHALATION_SOLUTION | RESPIRATORY_TRACT | Status: DC
  Administered 2011-11-21 (×4): 2.5 mg via RESPIRATORY_TRACT
  Filled 2011-11-21 (×4): qty 0.5

## 2011-11-21 MED ORDER — OXYCODONE-ACETAMINOPHEN 5-325 MG PO TABS
1.0000 | ORAL_TABLET | Freq: Once | ORAL | Status: AC
  Administered 2011-11-21: 1 via ORAL
  Filled 2011-11-21: qty 1

## 2011-11-21 MED ORDER — ENOXAPARIN SODIUM 40 MG/0.4ML ~~LOC~~ SOLN
40.0000 mg | SUBCUTANEOUS | Status: DC
  Administered 2011-11-21 – 2011-11-23 (×3): 40 mg via SUBCUTANEOUS
  Filled 2011-11-21 (×3): qty 0.4

## 2011-11-21 MED ORDER — POTASSIUM CHLORIDE CRYS ER 20 MEQ PO TBCR
20.0000 meq | EXTENDED_RELEASE_TABLET | Freq: Two times a day (BID) | ORAL | Status: DC
  Administered 2011-11-21 – 2011-11-23 (×5): 20 meq via ORAL
  Filled 2011-11-21 (×2): qty 1
  Filled 2011-11-21: qty 2
  Filled 2011-11-21 (×4): qty 1

## 2011-11-21 MED ORDER — FENOFIBRATE 160 MG PO TABS
160.0000 mg | ORAL_TABLET | Freq: Every day | ORAL | Status: DC
  Administered 2011-11-21 – 2011-11-23 (×3): 160 mg via ORAL
  Filled 2011-11-21 (×3): qty 1

## 2011-11-21 MED ORDER — ONDANSETRON HCL 4 MG/2ML IJ SOLN
4.0000 mg | Freq: Four times a day (QID) | INTRAMUSCULAR | Status: DC | PRN
  Administered 2011-11-21 – 2011-11-23 (×4): 4 mg via INTRAVENOUS
  Filled 2011-11-21 (×3): qty 2

## 2011-11-21 NOTE — Progress Notes (Signed)
Subjective: Complaining of severe coughing and shortness of breath  Physical Exam: Blood pressure 127/75, pulse 108, temperature 98.3 F (36.8 C), temperature source Oral, resp. rate 18, height 5' (1.524 m), weight 79.606 kg (175 lb 8 oz), SpO2 91.00%. Alert and oriented x3 Chest with bilateral rhonchi wheezes and chronic CVS: tachycardic     Basic Metabolic Panel:  Basename 11/21/11 1125 11/21/11 0242  NA -- 139  K -- 3.6  CL -- 100  CO2 -- --  GLUCOSE -- 129*  BUN -- 8  CREATININE 0.51 0.80  CALCIUM -- --  MG -- --  PHOS -- --   Liver Function Tests: No results found for this basename: AST:2,ALT:2,ALKPHOS:2,BILITOT:2,PROT:2,ALBUMIN:2 in the last 72 hours   CBC:  Basename 11/21/11 1125 11/21/11 0242 11/21/11 0216  WBC 19.5* -- 25.7*  NEUTROABS -- -- 21.3*  HGB 11.4* 13.6 --  HCT 35.8* 40.0 --  MCV 85.9 -- 85.1  PLT 361 -- 403*    Dg Chest 2 View  11/20/2011  *RADIOLOGY REPORT*  Clinical Data: Low grade fever.  Chills.  Oxygen dependent COPD.  CHEST - 2 VIEW 11/20/2011:  Comparison: Two-view chest x-ray 09/26/2011 [facility], 12/02/2010 [facility].  Findings: Cardiomediastinal silhouette unremarkable and unchanged. Prominent bronchovascular markings diffusely and marked central peribronchial thickening, more so than on the prior examinations. No confluent airspace consolidation.  No pleural effusions. Visualized bony thorax intact.  IMPRESSION: Severe changes of acute bronchitis.  Original Report Authenticated By: Arnell Sieving, M.D.      Medications:  Scheduled:   . albuterol  2.5 mg Nebulization Once  . albuterol  2.5 mg Nebulization Once  . albuterol  2.5 mg Nebulization Q4H  . ARIPiprazole  5 mg Oral Daily  . ARIPiprazole  5 mg Oral Once  . docusate sodium  100 mg Oral BID  . enoxaparin  40 mg Subcutaneous Q24H  . fenofibrate  160 mg Oral Daily  . fenofibrate  160 mg Oral Once  . Fluticasone-Salmeterol  1 puff Inhalation Q12H  .  furosemide  20 mg Oral Daily  . glyBURIDE  5 mg Oral Once  . glyBURIDE  5 mg Oral BID WC  . insulin aspart  0-20 Units Subcutaneous TID WC  . insulin aspart  0-5 Units Subcutaneous QHS  . insulin aspart  6 Units Subcutaneous TID WC  . insulin glargine  10 Units Subcutaneous QHS  . ipratropium  0.5 mg Nebulization Once  . ipratropium  0.5 mg Nebulization Once  . metFORMIN  500 mg Oral Once  . metFORMIN  500 mg Oral BID WC  . methylPREDNISolone (SOLU-MEDROL) injection  125 mg Intravenous Once  . methylPREDNISolone (SOLU-MEDROL) injection  60 mg Intravenous Q6H  . moxifloxacin  400 mg Intravenous Q24H  . moxifloxacin  400 mg Oral Once  . ondansetron      . ondansetron      . oseltamivir  75 mg Oral BID  . oxyCODONE-acetaminophen  1 tablet Oral Once  . oxyCODONE-acetaminophen  1 tablet Oral Once  . potassium chloride SA  20 mEq Oral BID  . sodium chloride  1,000 mL Intravenous Once  . sodium chloride  3 mL Intravenous Q12H  . tiotropium  18 mcg Inhalation Daily  . tiZANidine  4 mg Oral Once  . tiZANidine  4 mg Oral TID  . venlafaxine  75 mg Oral Daily  . venlafaxine  75 mg Oral Once  . DISCONTD: Fluticasone-Salmeterol  1 puff Inhalation Q12H  . DISCONTD:  glyBURIDE-metformin  1 tablet Oral BID WC  . DISCONTD: tiZANidine  4 mg Oral TID    Impression:  Principal Problem:  *COPD Active Problems:  MAJOR DPRSV DISORDER RECURRENT EPISODE MODERATE  Low back pain  Viral syndrome  DM (diabetes mellitus)     Plan: Continue current treatment with bronchodilators steroids antibiotics     LOS: 1 day   Elmus Mathes, MD Pager: [phone] 11/21/2011, 3:21 PM

## 2011-11-21 NOTE — ED Provider Notes (Signed)
History     CSN: [id number]  Arrival date & time 11/20/11  2159   First MD Initiated Contact with Patient 11/21/11 0202      Chief Complaint  Patient presents with  . Shortness of Breath    (Consider location/radiation/quality/duration/timing/severity/associated sxs/prior treatment) Patient is a 45 y.o. female presenting with shortness of breath. The history is provided by the patient. No language interpreter was used.  Shortness of Breath  The current episode started 3 to 5 days ago. The onset was gradual. The problem occurs continuously. The problem has been unchanged. The problem is severe. The symptoms are relieved by nothing. The symptoms are aggravated by nothing. Associated symptoms include cough, shortness of breath and wheezing. Pertinent negatives include no chest pain, no chest pressure, no orthopnea, no fever, no rhinorrhea, no sore throat and no stridor. There was no intake of a foreign body. The Heimlich maneuver was not attempted. She was not exposed to toxic fumes. She has not inhaled smoke recently. She has had intermittent steroid use. She has had prior hospitalizations. She has been behaving normally. Urine output has been normal. The last void occurred less than 6 hours ago. There were no sick contacts. Recently, medical care has been given at this facility. Services received include tests performed and medications given.  Has not had the flu shot.  Has had a productive cough and some fatigue and bodyaches.    Past Medical History  Diagnosis Date  . Migraine headache   . On home O2   . COPD (chronic obstructive pulmonary disease)   . Bipolar disorder   . Chronic back pain   . Hyperglycemia, drug-induced     steroid induced hyperglycemia  . ARDS (adult respiratory distress syndrome)     Jan 2011  . Diabetes mellitus     Past Surgical History  Procedure Date  . Tubal ligation   . Uterine ablasion   . C-section   . Tracheostomy     decannulated 12/2009     No family history on file.  History  Substance Use Topics  . Smoking status: Former Smoker -- 0.0 packs/day for 33 years    Quit date: 07/30/2011  . Smokeless tobacco: Not on file   Comment: smoking 1-2 cigarettes every other day  . Alcohol Use: No    OB History    Grav Para Term Preterm Abortions TAB SAB Ect Mult Living                  Review of Systems  Constitutional: Negative for fever.  HENT: Negative for sore throat, facial swelling and rhinorrhea.   Eyes: Negative for discharge.  Respiratory: Positive for cough, shortness of breath and wheezing. Negative for stridor.   Cardiovascular: Negative for chest pain, palpitations, orthopnea and leg swelling.  Genitourinary: Negative for difficulty urinating.  Musculoskeletal: Positive for myalgias.  Skin: Negative.   Neurological: Negative.   Hematological: Negative.   Psychiatric/Behavioral: Negative.     Allergies  Penicillins and Morphine  Home Medications   Current Outpatient Rx  Name Route Sig Dispense Refill  . ALBUTEROL SULFATE HFA 108 (90 BASE) MCG/ACT IN AERS Inhalation Inhale 2 puffs into the lungs every 6 (six) hours as needed. For shortness of breath     . ALBUTEROL SULFATE (2.5 MG/3ML) 0.083% IN NEBU Nebulization Take 2.5 mg by nebulization every 4 (four) hours as needed. For shortness of breath     . ARIPIPRAZOLE 5 MG PO TABS Oral Take 5 mg by mouth  daily.      . CHOLINE FENOFIBRATE 135 MG PO CPDR Oral Take 135 mg by mouth daily.      [name] FLUTICASONE-SALMETEROL 250-50 MCG/DOSE IN AEPB Inhalation Inhale 1 puff into the lungs every 12 (twelve) hours.      . FUROSEMIDE 20 MG PO TABS Oral Take 20 mg by mouth daily.      . GLYBURIDE-METFORMIN 5-500 MG PO TABS Oral Take 1 tablet by mouth 2 (two) times daily with a meal.      . OXYCODONE-ACETAMINOPHEN 10-325 MG PO TABS Oral Take 1 tablet by mouth every 4 (four) hours as needed. For pain    . POTASSIUM CHLORIDE CRYS CR 20 MEQ PO TBCR Oral Take 20 mEq by mouth 2  (two) times daily.      [name] TIOTROPIUM BROMIDE MONOHYDRATE 18 MCG IN CAPS Inhalation Place 18 mcg into inhaler and inhale daily.      [name] TIZANIDINE HCL 4 MG PO TABS Oral Take 4 mg by mouth 3 (three) times daily.     . VENLAFAXINE HCL 75 MG PO TABS Oral Take 75 mg by mouth daily.        BP 120/81  Pulse 119  Temp(Src) 100.8 F (38.2 C) (Oral)  Resp 22  SpO2 97%  Physical Exam  Constitutional: She is oriented to person, place, and time. She appears well-developed and well-nourished.  HENT:  Head: Normocephalic and atraumatic.  Mouth/Throat: Oropharynx is clear and moist. No oropharyngeal exudate.  Eyes: Conjunctivae are normal. Pupils are equal, round, and reactive to light.  Neck: Normal range of motion. Neck supple.  Cardiovascular: Tachycardia present.   Pulmonary/Chest: No stridor. She has wheezes. She has no rales.  Abdominal: Soft. Bowel sounds are normal. There is no tenderness.  Musculoskeletal: Normal range of motion.  Neurological: She is alert and oriented to person, place, and time.  Skin: Skin is warm and dry. She is not diaphoretic.  Psychiatric: Thought content normal.    ED Course  Procedures (including critical care time)  Labs Reviewed  CBC - Abnormal; Notable for the following:    WBC 25.7 (*) REPEATED TO VERIFY   Platelets 403 (*)    All other components within normal limits  DIFFERENTIAL - Abnormal; Notable for the following:    Neutrophils Relative 83 (*)    Lymphocytes Relative 10 (*)    Neutro Abs 21.3 (*)    Monocytes Absolute 1.8 (*)    All other components within normal limits  POCT I-STAT, CHEM 8 - Abnormal; Notable for the following:    Glucose, Bld 129 (*)    Calcium, Ion 1.11 (*)    All other components within normal limits  POCT I-STAT TROPONIN I  I-STAT, CHEM 8  I-STAT TROPONIN I   Dg Chest 2 View  11/20/2011  *RADIOLOGY REPORT*  Clinical Data: Low grade fever.  Chills.  Oxygen dependent COPD.  CHEST - 2 VIEW 11/20/2011:  Comparison:  Two-view chest x-ray 09/26/2011 [facility], 12/02/2010 [facility].  Findings: Cardiomediastinal silhouette unremarkable and unchanged. Prominent bronchovascular markings diffusely and marked central peribronchial thickening, more so than on the prior examinations. No confluent airspace consolidation.  No pleural effusions. Visualized bony thorax intact.  IMPRESSION: Severe changes of acute bronchitis.  Original Report Authenticated By: Arnell Sieving, M.D.     No diagnosis found.  Results for orders placed during the hospital encounter of 11/20/11  CBC      Component Value Range   WBC 25.7 (*)  4.0 - 10.5 (K/uL)   RBC 4.51  3.87 - 5.11 (MIL/uL)   Hemoglobin 12.4  12.0 - 15.0 (g/dL)   HCT 81.1  91.4 - 78.2 (%)   MCV 85.1  78.0 - 100.0 (fL)   MCH 27.5  26.0 - 34.0 (pg)   MCHC 32.3  30.0 - 36.0 (g/dL)   RDW 95.6  21.3 - 08.6 (%)   Platelets 403 (*) 150 - 400 (K/uL)  DIFFERENTIAL      Component Value Range   Neutrophils Relative 83 (*) 43 - 77 (%)   Lymphocytes Relative 10 (*) 12 - 46 (%)   Monocytes Relative 7  3 - 12 (%)   Eosinophils Relative 0  0 - 5 (%)   Basophils Relative 0  0 - 1 (%)   Neutro Abs 21.3 (*) 1.7 - 7.7 (K/uL)   Lymphs Abs 2.6  0.7 - 4.0 (K/uL)   Monocytes Absolute 1.8 (*) 0.1 - 1.0 (K/uL)   Eosinophils Absolute 0.0  0.0 - 0.7 (K/uL)   Basophils Absolute 0.0  0.0 - 0.1 (K/uL)   Smear Review MORPHOLOGY UNREMARKABLE    POCT I-STAT, CHEM 8      Component Value Range   Sodium 139  135 - 145 (mEq/L)   Potassium 3.6  3.5 - 5.1 (mEq/L)   Chloride 100  96 - 112 (mEq/L)   BUN 8  6 - 23 (mg/dL)   Creatinine, Ser 5.78  0.50 - 1.10 (mg/dL)   Glucose, Bld 469 (*) 70 - 99 (mg/dL)   Calcium, Ion 6.29 (*) 1.12 - 1.32 (mmol/L)   TCO2 32  0 - 100 (mmol/L)   Hemoglobin 13.6  12.0 - 15.0 (g/dL)   HCT 52.8  41.3 - 24.4 (%)  POCT I-STAT TROPONIN I      Component Value Range   Troponin i, poc 0.00  0.00 - 0.08 (ng/mL)   Comment 3            Dg Chest 2  View  11/20/2011  *RADIOLOGY REPORT*  Clinical Data: Low grade fever.  Chills.  Oxygen dependent COPD.  CHEST - 2 VIEW 11/20/2011:  Comparison: Two-view chest x-ray 09/26/2011 [facility], 12/02/2010 [facility].  Findings: Cardiomediastinal silhouette unremarkable and unchanged. Prominent bronchovascular markings diffusely and marked central peribronchial thickening, more so than on the prior examinations. No confluent airspace consolidation.  No pleural effusions. Visualized bony thorax intact.  IMPRESSION: Severe changes of acute bronchitis.  Original Report Authenticated By: Arnell Sieving, M.D.     MDM   Date: 11/21/2011  Rate: 121  Rhythm: sinus tachycardia  QRS Axis: normal  Intervals: normal  ST/T Wave abnormalities: normal  Conduction Disutrbances:none  Narrative Interpretation:   Old EKG Reviewed: unchanged     MDM Reviewed: previous chart and vitals Interpretation: labs, ECG and x-ray       Fabiana Dromgoole K Eufemio Strahm-Rasch, MD 11/21/11 [phone]

## 2011-11-21 NOTE — ED Notes (Signed)
PT AWARE TO BE ADMITTED. STATED THAT SHE MISSED HER 10 PM MEDS AND THAT SHE NEEDS PAIN AND SNAUSEA MEDICINE. HAS LOOSE PRODUCTIVE LIGHT GREENISH/YELLOW SPUTUM.

## 2011-11-21 NOTE — ED Notes (Signed)
Family at bedside. 

## 2011-11-21 NOTE — ED Notes (Signed)
Pt resting, respirations unlabored.  Sats dropped to 82% when pt was sleeping, EDP to call for admission.

## 2011-11-21 NOTE — ED Notes (Signed)
Place pt. On monitor

## 2011-11-21 NOTE — H&P (Signed)
PCP:   FANTA,TESFAYE, MD   Chief Complaint: Shortness of breath and myalgia   HPI: [name] is an 45 y.o. female with history of severe COPD, on home O2 at 2 L nasal cannula, diabetes, chronic low back pain on chronic narcotic, bipolar disorder, presents to the emergency room with 3 to four-day history of severe myalgia, yellow-green sputum productive cough, and increase shortness of breath. She denied any distant travel but one of her daughter has been ill. She has no diarrhea, nausea vomiting, headache, sore throat or any other symptomology. Evaluation in the emergency room included a severe leukocytosis with a white count 25,000, a chest x-ray with bronchitis and COPD, but no definite infiltrate. After several nebulizer treatments, intravenous steroid was given by the emergency room physician, and hospitalist was asked to admit patient for COPD exacerbation and possible influenza infection.  Rewiew of Systems:  The patient denies anorexia, fever, weight loss,, vision loss, decreased hearing, hoarseness, chest pain, syncope, , peripheral edema, balance deficits, hemoptysis, abdominal pain, melena, hematochezia, severe indigestion/heartburn, hematuria, incontinence, genital sores, muscle weakness, suspicious skin lesions, transient blindness, difficulty walking, depression, unusual weight change, abnormal bleeding, enlarged lymph nodes, angioedema, and breast masses.    Past Medical History  Diagnosis Date  . Migraine headache   . On home O2   . COPD (chronic obstructive pulmonary disease)   . Bipolar disorder   . Chronic back pain   . Hyperglycemia, drug-induced     steroid induced hyperglycemia  . ARDS (adult respiratory distress syndrome)     Jan 2011  . Diabetes mellitus     Past Surgical History  Procedure Date  . Tubal ligation   . Uterine ablasion   . C-section   . Tracheostomy     decannulated 12/2009    Medications:  HOME MEDS: Prior to Admission medications     Medication Sig Start Date End Date Taking? Authorizing Provider  albuterol (PROVENTIL HFA;VENTOLIN HFA) 108 (90 BASE) MCG/ACT inhaler Inhale 2 puffs into the lungs every 6 (six) hours as needed. For shortness of breath  08/17/11  Yes Leslye Peer, MD  albuterol (PROVENTIL) (2.5 MG/3ML) 0.083% nebulizer solution Take 2.5 mg by nebulization every 4 (four) hours as needed. For shortness of breath  03/19/11  Yes Leotha Parrett, NP  ARIPiprazole (ABILIFY) 5 MG tablet Take 5 mg by mouth daily.     Yes Historical Provider, MD  Choline Fenofibrate (TRILIPIX) 135 MG capsule Take 135 mg by mouth daily.     Yes Historical Provider, MD  Fluticasone-Salmeterol (ADVAIR) 250-50 MCG/DOSE AEPB Inhale 1 puff into the lungs every 12 (twelve) hours.   10/14/11  Yes Leslye Peer, MD  furosemide (LASIX) 20 MG tablet Take 20 mg by mouth daily.     Yes Historical Provider, MD  glyBURIDE-metformin (GLUCOVANCE) 5-500 MG per tablet Take 1 tablet by mouth 2 (two) times daily with a meal.     Yes Historical Provider, MD  oxyCODONE-acetaminophen (PERCOCET) 10-325 MG per tablet Take 1 tablet by mouth every 4 (four) hours as needed. For pain   Yes Historical Provider, MD  potassium chloride SA (K-DUR,KLOR-CON) 20 MEQ tablet Take 20 mEq by mouth 2 (two) times daily.     Yes Historical Provider, MD  tiotropium (SPIRIVA) 18 MCG inhalation capsule Place 18 mcg into inhaler and inhale daily.   11/18/11  Yes Leslye Peer, MD  tiZANidine (ZANAFLEX) 4 MG tablet Take 4 mg by mouth 3 (three) times daily.  Yes Historical Provider, MD  venlafaxine (EFFEXOR) 75 MG tablet Take 75 mg by mouth daily.     Yes Historical Provider, MD     Allergies:  Allergies  Allergen Reactions  . Penicillins Other (See Comments)    convulsions  . Morphine Nausea Only    Social History:   reports that she quit smoking about 3 months ago. She does not have any smokeless tobacco history on file. She reports that she does not drink alcohol or use  illicit drugs.  Family History: No family history on file.   Physical Exam: Filed Vitals:   11/20/11 2314 11/21/11 0216 11/21/11 0625  BP: 118/67 120/81 113/71  Pulse: 108 119   Temp: 99.7 F (37.6 C) 100.8 F (38.2 C) 98.3 F (36.8 C)  TempSrc: Oral  Oral  Resp: 16 22 18   SpO2: 96% 97% 94%   Blood pressure 113/71, pulse 119, temperature 98.3 F (36.8 C), temperature source Oral, resp. rate 18, SpO2 94.00%.  GEN:  Pleasant  person lying in the stretcher in no acute distress; cooperative with exam PSYCH:  alert and oriented x4; does not appear anxious does not appear depressed; affect is normal HEENT: Mucous membranes pink and anicteric; PERRLA; EOM intact; no cervical lymphadenopathy nor thyromegaly or carotid bruit; no JVD; Breasts:: Not examined CHEST WALL: No tenderness CHEST: Shallow breathing, diffuse coughing, but no basilar crackles. HEART: Regular rate and rhythm; no murmurs rubs or gallops BACK: No kyphosis or scoliosis; no CVA tenderness ABDOMEN: Obese, soft non-tender; no masses, no organomegaly, normal abdominal bowel sounds; no pannus; no intertriginous candida. Rectal Exam: Not done EXTREMITIES: No bone or joint deformity; age-appropriate arthropathy of the hands and knees; no edema; no ulcerations. Genitalia: not examined PULSES: 2+ and symmetric SKIN: Normal hydration no rash or ulceration CNS: Cranial nerves 2-12 grossly intact no focal neurologic deficit   Labs & Imaging Results for orders placed during the hospital encounter of 11/20/11 (from the past 48 hour(s))  CBC     Status: Abnormal   Collection Time   11/21/11  2:16 AM      Component Value Range Comment   WBC 25.7 (*) 4.0 - 10.5 (K/uL) REPEATED TO VERIFY   RBC 4.51  3.87 - 5.11 (MIL/uL)    Hemoglobin 12.4  12.0 - 15.0 (g/dL)    HCT 52.8  41.3 - 24.4 (%)    MCV 85.1  78.0 - 100.0 (fL)    MCH 27.5  26.0 - 34.0 (pg)    MCHC 32.3  30.0 - 36.0 (g/dL)    RDW 01.0  27.2 - 53.6 (%)    Platelets  403 (*) 150 - 400 (K/uL)   DIFFERENTIAL     Status: Abnormal   Collection Time   11/21/11  2:16 AM      Component Value Range Comment   Neutrophils Relative 83 (*) 43 - 77 (%)    Lymphocytes Relative 10 (*) 12 - 46 (%)    Monocytes Relative 7  3 - 12 (%)    Eosinophils Relative 0  0 - 5 (%)    Basophils Relative 0  0 - 1 (%)    Neutro Abs 21.3 (*) 1.7 - 7.7 (K/uL)    Lymphs Abs 2.6  0.7 - 4.0 (K/uL)    Monocytes Absolute 1.8 (*) 0.1 - 1.0 (K/uL)    Eosinophils Absolute 0.0  0.0 - 0.7 (K/uL)    Basophils Absolute 0.0  0.0 - 0.1 (K/uL)    Smear Review MORPHOLOGY  UNREMARKABLE     POCT I-STAT, CHEM 8     Status: Abnormal   Collection Time   11/21/11  2:42 AM      Component Value Range Comment   Sodium 139  135 - 145 (mEq/L)    Potassium 3.6  3.5 - 5.1 (mEq/L)    Chloride 100  96 - 112 (mEq/L)    BUN 8  6 - 23 (mg/dL)    Creatinine, Ser 4.78  0.50 - 1.10 (mg/dL)    Glucose, Bld 295 (*) 70 - 99 (mg/dL)    Calcium, Ion 6.21 (*) 1.12 - 1.32 (mmol/L)    TCO2 32  0 - 100 (mmol/L)    Hemoglobin 13.6  12.0 - 15.0 (g/dL)    HCT 30.8  65.7 - 84.6 (%)   POCT I-STAT TROPONIN I     Status: Normal   Collection Time   11/21/11  2:42 AM      Component Value Range Comment   Troponin i, poc 0.00  0.00 - 0.08 (ng/mL)    Comment 3            GLUCOSE, CAPILLARY     Status: Abnormal   Collection Time   11/21/11  6:37 AM      Component Value Range Comment   Glucose-Capillary 201 (*) 70 - 99 (mg/dL)    Comment 1 Notify RN      Comment 2 Documented in Chart      Dg Chest 2 View  11/20/2011  *RADIOLOGY REPORT*  Clinical Data: Low grade fever.  Chills.  Oxygen dependent COPD.  CHEST - 2 VIEW 11/20/2011:  Comparison: Two-view chest x-ray 09/26/2011 [facility], 12/02/2010 [facility].  Findings: Cardiomediastinal silhouette unremarkable and unchanged. Prominent bronchovascular markings diffusely and marked central peribronchial thickening, more so than on the prior examinations. No  confluent airspace consolidation.  No pleural effusions. Visualized bony thorax intact.  IMPRESSION: Severe changes of acute bronchitis.  Original Report Authenticated By: Arnell Sieving, M.D.      Assessment Present on Admission:  .COPD .MAJOR DPRSV DISORDER RECURRENT EPISODE MODERATE .Low back pain .DM (diabetes mellitus) Possible influenza infection  PLAN: Will admit her to telemetry. Because the influenza test is quite limited at this time, I would treat her with Tamiflu empirically. Will also continue her IV Solu-Medrol along with frequent nebulizer treatments, and IV Avelox. The sputum did change color from her usual productive cough. Her blood glucose will undoubtedly be elevated with steroid, and I will use resistant insulin sliding scale, in addition I will add basal Lantus to her regimen. She is stable, full code, and will be admitted to triad hospitalist service. I have not consulted Dr. Delton Coombes, her pulmonologist, but I suspect she will do well.  Other plans as per orders.    Alaiyah Bollman 11/21/2011, 7:48 AM

## 2011-11-22 LAB — BASIC METABOLIC PANEL
BUN: 11 mg/dL (ref 6–23)
CO2: 28 mEq/L (ref 19–32)
Chloride: 102 mEq/L (ref 96–112)
Creatinine, Ser: 0.42 mg/dL — ABNORMAL LOW (ref 0.50–1.10)
GFR calc Af Amer: >90 mL/min (ref >90)
Potassium: 4 mEq/L (ref 3.5–5.1)

## 2011-11-22 LAB — GLUCOSE, CAPILLARY
Glucose-Capillary: 188 mg/dL — ABNORMAL HIGH (ref 70–99)
Glucose-Capillary: 152 mg/dL — ABNORMAL HIGH (ref 70–99)

## 2011-11-22 LAB — EXPECTORATED SPUTUM ASSESSMENT W GRAM STAIN, RFLX TO RESP C

## 2011-11-22 LAB — CBC
HCT: 34 % — ABNORMAL LOW (ref 36.0–46.0)
Hemoglobin: 10.4 g/dL — ABNORMAL LOW (ref 12.0–15.0)
MCV: 85.4 fL (ref 78.0–100.0)
WBC: 18.6 10*3/uL — ABNORMAL HIGH (ref 4.0–10.5)

## 2011-11-22 MED ORDER — INSULIN GLARGINE 100 UNIT/ML ~~LOC~~ SOLN
15.0000 [IU] | Freq: Every day | SUBCUTANEOUS | Status: DC
  Administered 2011-11-22: 15 [IU] via SUBCUTANEOUS

## 2011-11-22 MED ORDER — PREDNISONE 50 MG PO TABS
50.0000 mg | ORAL_TABLET | Freq: Every day | ORAL | Status: DC
  Administered 2011-11-23: 50 mg via ORAL
  Filled 2011-11-22 (×2): qty 1

## 2011-11-22 NOTE — Progress Notes (Signed)
Subjective:  Says she feels better.   Physical Exam: Blood pressure 110/68, pulse 91, temperature 97.9 F (36.6 C), temperature source Oral, resp. rate 18, height 5' (1.524 m), weight 79.606 kg (175 lb 8 oz), SpO2 95.00%.   Filed Vitals:   11/22/11 0156 11/22/11 0651 11/22/11 0915 11/22/11 1259  BP: 112/69 110/68    Pulse: 96 91    Temp: 97.8 F (36.6 C) 97.9 F (36.6 C)    TempSrc: Oral Oral    Resp: 18 18    Height:      Weight:      SpO2: 93% 95% 94% 95%   Markedly improved  CVS: RRR RS: clearer Abdomen: soft, NT    Basic Metabolic Panel:  Basename 11/22/11 0700 11/21/11 1125 11/21/11 0242  NA 136 -- 139  K 4.0 -- 3.6  CL 102 -- 100  CO2 28 -- --  GLUCOSE 189* -- 129*  BUN 11 -- 8  CREATININE 0.42* 0.51 --  CALCIUM 8.8 -- --  MG -- -- --  PHOS -- -- --    CBC:  Basename 11/22/11 0700 11/21/11 1125 11/21/11 0216  WBC 18.6* 19.5* --  NEUTROABS -- -- 21.3*  HGB 10.4* 11.4* --  HCT 34.0* 35.8* --  MCV 85.4 85.9 --  PLT 378 361 --    Dg Chest 2 View  11/20/2011  *RADIOLOGY REPORT*  Clinical Data: Low grade fever.  Chills.  Oxygen dependent COPD.  CHEST - 2 VIEW 11/20/2011:  Comparison: Two-view chest x-ray 09/26/2011 [facility], 12/02/2010 [facility].  Findings: Cardiomediastinal silhouette unremarkable and unchanged. Prominent bronchovascular markings diffusely and marked central peribronchial thickening, more so than on the prior examinations. No confluent airspace consolidation.  No pleural effusions. Visualized bony thorax intact.  IMPRESSION: Severe changes of acute bronchitis.  Original Report Authenticated By: Arnell Sieving, M.D.    CBG (last 3)   Basename 11/22/11 1113 11/22/11 0643 11/21/11 2228  GLUCAP 241* 188* 257*      Medications:  Scheduled:    . albuterol  2.5 mg Nebulization QID  . ARIPiprazole  5 mg Oral Daily  . docusate sodium  100 mg Oral BID  . enoxaparin  40 mg Subcutaneous Q24H  . fenofibrate  160  mg Oral Daily  . Fluticasone-Salmeterol  1 puff Inhalation Q12H  . furosemide  20 mg Oral Daily  . glyBURIDE  5 mg Oral BID WC  . insulin aspart  0-20 Units Subcutaneous TID WC  . insulin aspart  0-5 Units Subcutaneous QHS  . insulin aspart  6 Units Subcutaneous TID WC  . insulin glargine  15 Units Subcutaneous QHS  . metFORMIN  500 mg Oral Once  . metFORMIN  500 mg Oral BID WC  . moxifloxacin  400 mg Intravenous Q24H  . oseltamivir  75 mg Oral BID  . potassium chloride SA  20 mEq Oral BID  . predniSONE  50 mg Oral Q breakfast  . sodium chloride  3 mL Intravenous Q12H  . tiotropium  18 mcg Inhalation Daily  . tiZANidine  4 mg Oral Once  . tiZANidine  4 mg Oral TID  . venlafaxine  75 mg Oral Daily  . DISCONTD: albuterol  2.5 mg Nebulization Q4H  . DISCONTD: insulin glargine  10 Units Subcutaneous QHS  . DISCONTD: methylPREDNISolone (SOLU-MEDROL) injection  60 mg Intravenous Q6H    Impression: COPD exacerbation  Principal Problem:  *COPD Active Problems:  MAJOR DPRSV DISORDER RECURRENT EPISODE MODERATE  Low back  pain  Viral syndrome  DM (diabetes mellitus)     Plan:   Taper steroids Increase lantus Continue tamiflu and avelox     LOS: 2 days   Mashawn Brazil, MD Pager: [phone] 11/22/2011, 1:50 PM

## 2011-11-23 LAB — GLUCOSE, CAPILLARY
Glucose-Capillary: 60 mg/dL — ABNORMAL LOW (ref 70–99)
Glucose-Capillary: 64 mg/dL — ABNORMAL LOW (ref 70–99)
Glucose-Capillary: 72 mg/dL (ref 70–99)
Glucose-Capillary: 82 mg/dL (ref 70–99)
Glucose-Capillary: 63 mg/dL — ABNORMAL LOW (ref 70–99)

## 2011-11-23 IMAGING — CR DG CHEST 2V
2 series · 2 of 2 positions shown · non-contrast
Comparison: Chest radiograph performed 09/01/2010

CLINICAL DATA: Shortness of breath; history of smoking.

CHEST - 2 VIEW

[view not recorded (1 of 2)]
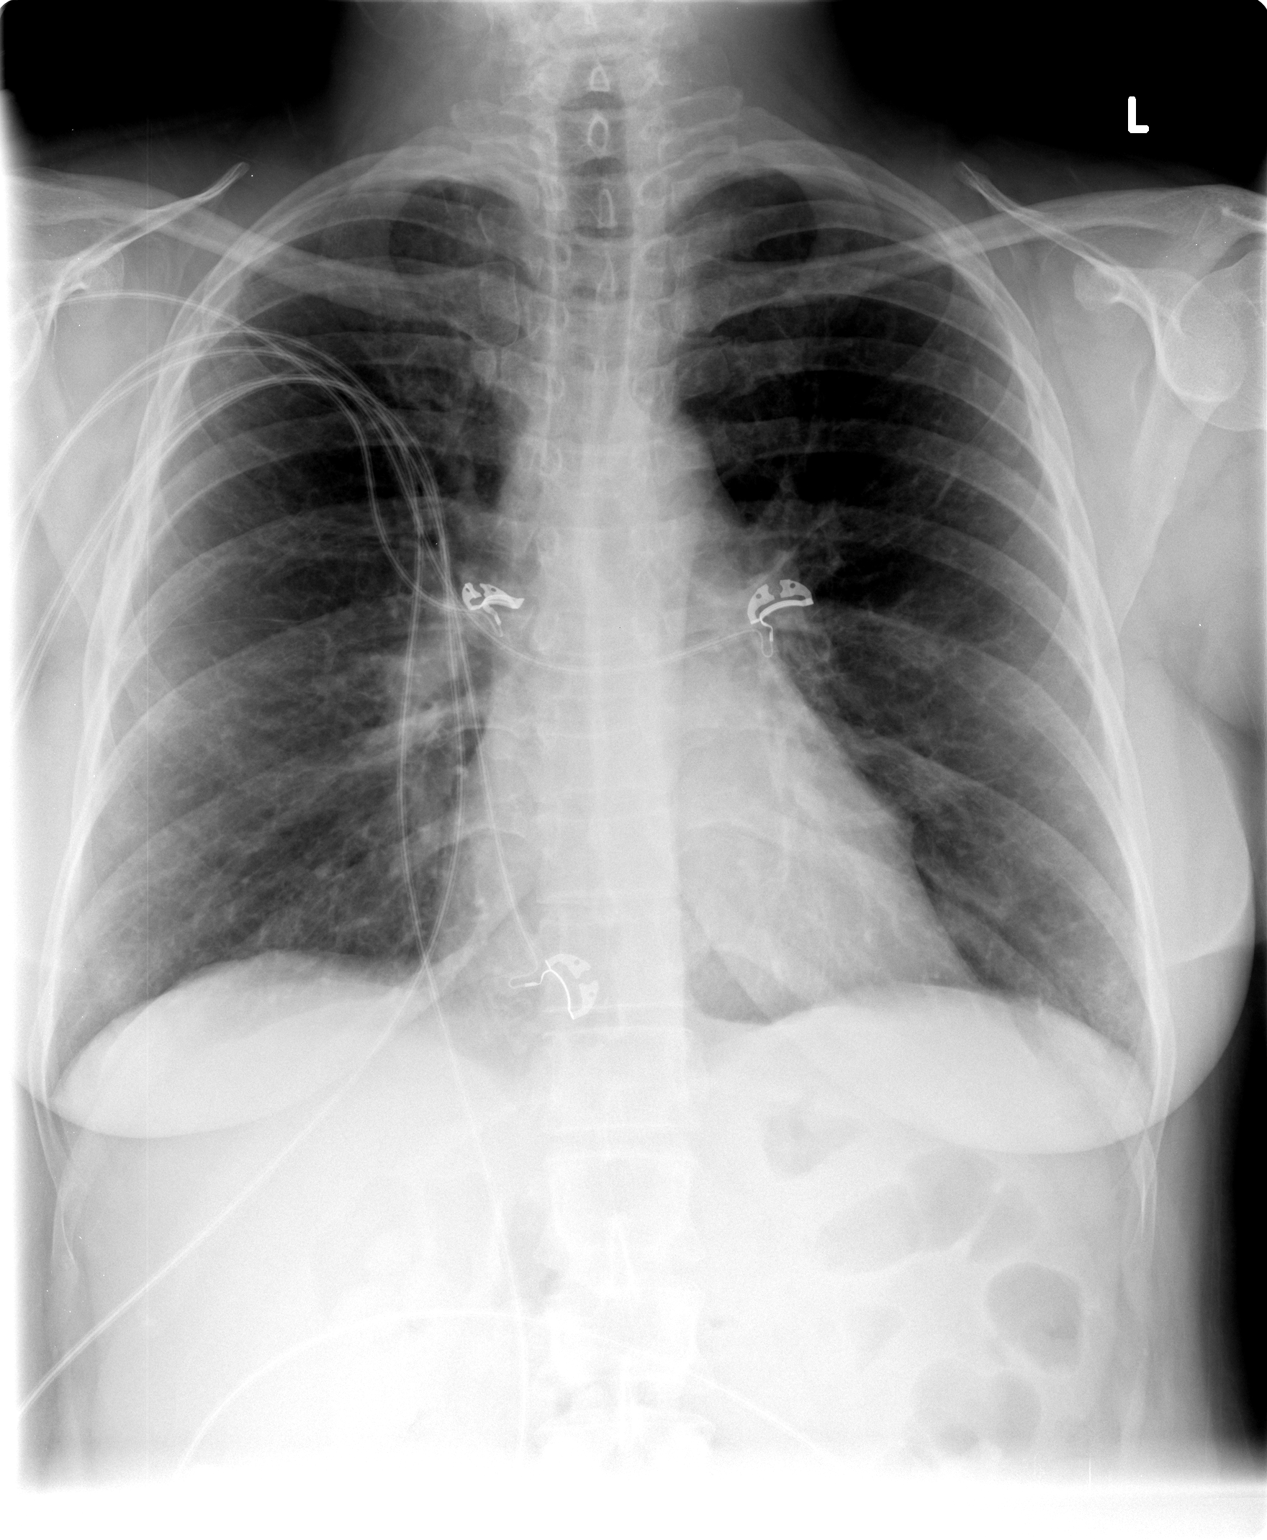

[view not recorded (2 of 2)]
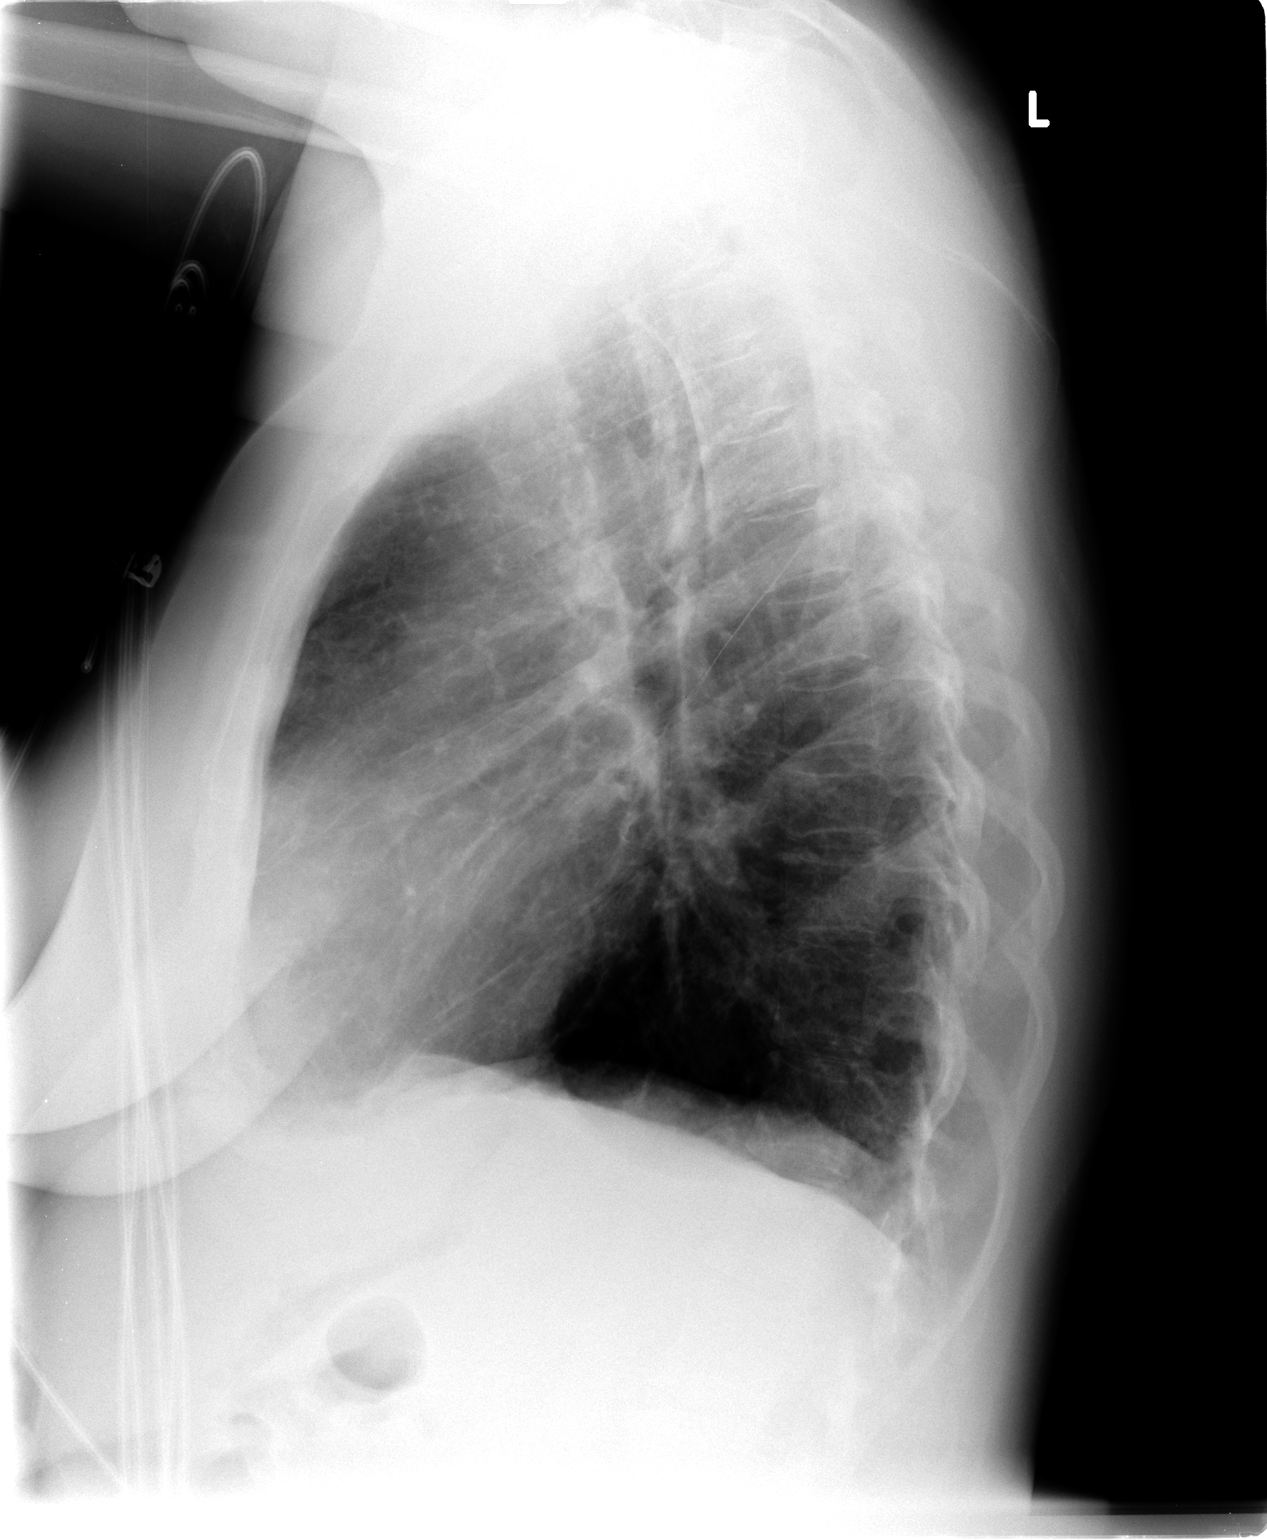

[2 of 2 positions shown; findings below may reference images not displayed]

FINDINGS: The lungs are well-aerated; mild chronically increased
interstitial markings are noted.  There is no evidence of focal
opacification, pleural effusion or pneumothorax.

The heart is normal in size; the mediastinal contour is within
normal limits.  No acute osseous abnormalities are seen.
IMPRESSION: No acute cardiopulmonary process seen.

## 2011-11-23 MED ORDER — LEVOFLOXACIN 500 MG PO TABS: 500.0000 mg | ORAL_TABLET | Freq: Every day | ORAL | Status: AC

## 2011-11-23 MED ORDER — GLUCOSE-VITAMIN C 4-6 GM-MG PO CHEW
CHEWABLE_TABLET | ORAL | Status: AC
  Administered 2011-11-23: 1 via ORAL
  Filled 2011-11-23: qty 1

## 2011-11-23 MED ORDER — PREDNISONE (PAK) 10 MG PO TABS: 10.0000 mg | ORAL_TABLET | ORAL | Status: AC

## 2011-11-23 MED ORDER — GLUCOSE-VITAMIN C 4-6 GM-MG PO CHEW
4.0000 | CHEWABLE_TABLET | ORAL | Status: DC | PRN
  Administered 2011-11-23: 1 via ORAL

## 2011-11-23 MED ORDER — OSELTAMIVIR PHOSPHATE 75 MG PO CAPS: 75.0000 mg | ORAL_CAPSULE | Freq: Two times a day (BID) | ORAL | Status: DC

## 2011-11-23 NOTE — Progress Notes (Signed)
11/23/11 Nursing 1222pm DC IV, DC Tele, DC Home. Discharge instructions and home medications discussed with patient and patient's family. Patient denies any questions or concerns at this time. Patient leaving unit via wheelchair and appears in no acute distress. Maurilio Lovely M,RN

## 2011-11-23 NOTE — Progress Notes (Signed)
11/23/11 Nursing 1216pm Patient's blood sugar was 63. Orange juice and graham crackers were given. Blood sugar re-checked after and is 109 now. Texted page MD. No new orders given. Ernesta Amble, RN

## 2011-11-24 LAB — GLUCOSE, CAPILLARY: Glucose-Capillary: 109 mg/dL — ABNORMAL HIGH (ref 70–99)

## 2011-11-24 NOTE — Discharge Summary (Signed)
Patient ID: [name] MRN: [id number] DOB/AGE: [birth year] 46 y.o. Primary Care Physician:FANTA,TESFAYE, MD Admit date: 11/20/2011 Discharge date: 11/24/2011    Discharge Diagnoses:  COPD exacerbation Principal Problem:  *COPD Active Problems:  MAJOR DPRSV DISORDER RECURRENT EPISODE MODERATE  Low back pain  Viral syndrome  DM (diabetes mellitus)   Medication List  As of 11/24/2011  5:38 PM   START taking these medications         levofloxacin 500 MG tablet   Commonly known as: LEVAQUIN   Take 1 tablet (500 mg total) by mouth daily.      oseltamivir 75 MG capsule   Commonly known as: TAMIFLU   Take 1 capsule (75 mg total) by mouth 2 (two) times daily.      predniSONE 10 MG tablet   Commonly known as: STERAPRED UNI-PAK   Take 1 tablet (10 mg total) by mouth See admin instructions. 4/day then 3/day then 2/day then 1/day then stop         CONTINUE taking these medications         ABILIFY 5 MG tablet   Generic drug: ARIPiprazole      * albuterol (2.5 MG/3ML) 0.083% nebulizer solution   Commonly known as: PROVENTIL      * albuterol 108 (90 BASE) MCG/ACT inhaler   Commonly known as: PROVENTIL HFA;VENTOLIN HFA      Fluticasone-Salmeterol 250-50 MCG/DOSE Aepb   Commonly known as: ADVAIR      furosemide 20 MG tablet   Commonly known as: LASIX      glyBURIDE-metformin 5-500 MG per tablet   Commonly known as: GLUCOVANCE      oxyCODONE-acetaminophen 10-325 MG per tablet   Commonly known as: PERCOCET      potassium chloride SA 20 MEQ tablet   Commonly known as: K-DUR,KLOR-CON      tiotropium 18 MCG inhalation capsule   Commonly known as: SPIRIVA      tiZANidine 4 MG tablet   Commonly known as: ZANAFLEX      TRILIPIX 135 MG capsule   Generic drug: Choline Fenofibrate      venlafaxine 75 MG tablet   Commonly known as: EFFEXOR     * Notice: This list has 2 medication(s) that are the same as other medications prescribed for you. Read the directions carefully,  and ask your doctor or other care provider to review them with you.        Where to get your medications    These are the prescriptions that you need to pick up.   You may get these medications from any pharmacy.         levofloxacin 500 MG tablet   oseltamivir 75 MG capsule   predniSONE 10 MG tablet            Discharged Condition: Good    Consults: None  Significant Diagnostic Studies: Dg Chest 2 View  11/20/2011  *RADIOLOGY REPORT*  Clinical Data: Low grade fever.  Chills.  Oxygen dependent COPD.  CHEST - 2 VIEW 11/20/2011:  Comparison: Two-view chest x-ray 09/26/2011 [facility], 12/02/2010 [facility].  Findings: Cardiomediastinal silhouette unremarkable and unchanged. Prominent bronchovascular markings diffusely and marked central peribronchial thickening, more so than on the prior examinations. No confluent airspace consolidation.  No pleural effusions. Visualized bony thorax intact.  IMPRESSION: Severe changes of acute bronchitis.  Original Report Authenticated By: Arnell Sieving, M.D.    Lab Results: Results for orders placed during the hospital encounter of 11/20/11 (  from the past 48 hour(s))  GLUCOSE, CAPILLARY     Status: Abnormal   Collection Time   11/22/11 10:28 PM      Component Value Range Comment   Glucose-Capillary 155 (*) 70 - 99 (mg/dL)    Comment 1 Notify RN     GLUCOSE, CAPILLARY     Status: Abnormal   Collection Time   11/23/11  6:50 AM      Component Value Range Comment   Glucose-Capillary 64 (*) 70 - 99 (mg/dL)    Comment 1 Notify RN      Comment 2 Documented in Chart     GLUCOSE, CAPILLARY     Status: Abnormal   Collection Time   11/23/11  6:54 AM      Component Value Range Comment   Glucose-Capillary 60 (*) 70 - 99 (mg/dL)   GLUCOSE, CAPILLARY     Status: Normal   Collection Time   11/23/11  7:17 AM      Component Value Range Comment   Glucose-Capillary 72  70 - 99 (mg/dL)   GLUCOSE, CAPILLARY     Status: Normal    Collection Time   11/23/11  7:28 AM      Component Value Range Comment   Glucose-Capillary 82  70 - 99 (mg/dL)    Comment 1 Notify RN      Comment 2 Documented in Chart     GLUCOSE, CAPILLARY     Status: Abnormal   Collection Time   11/23/11 11:04 AM      Component Value Range Comment   Glucose-Capillary 63 (*) 70 - 99 (mg/dL)    Comment 1 Notify RN      Comment 2 Documented in Chart      Recent Results (from the past 240 hour(s))  MRSA PCR SCREENING     Status: Normal   Collection Time   11/21/11  8:49 PM      Component Value Range Status Comment   MRSA by PCR NEGATIVE  NEGATIVE  Final   CULTURE, SPUTUM-ASSESSMENT     Status: Normal   Collection Time   11/22/11 10:19 AM      Component Value Range Status Comment   Specimen Description SPUTUM   Final    Special Requests NONE   Final    Sputum evaluation     Final    Value: THIS SPECIMEN IS ACCEPTABLE. RESPIRATORY CULTURE REPORT TO FOLLOW.   Report Status 11/22/2011 FINAL   Final   CULTURE, RESPIRATORY     Status: Normal (Preliminary result)   Collection Time   11/22/11 10:19 AM      Component Value Range Status Comment   Specimen Description SPUTUM   Final    Special Requests NONE   Final    Gram Stain     Final    Value: FEW WBC PRESENT, PREDOMINANTLY PMN     RARE SQUAMOUS EPITHELIAL CELLS PRESENT     RARE GRAM POSITIVE COCCI IN PAIRS     RARE GRAM POSITIVE RODS   Culture NORMAL OROPHARYNGEAL FLORA   Final    Report Status PENDING   Incomplete      Hospital Course: Krystena A Blalock is an 46 y.o. female with history of severe COPD, on home O2 at 2 L nasal cannula, diabetes, chronic low back pain on chronic narcotic, bipolar disorder, presents to the emergency room with 3 to four-day history of severe myalgia, yellow-green sputum productive cough, and increase shortness of breath. She denied any  distant travel but one of her daughter has been ill. She has no diarrhea, nausea vomiting, headache, sore throat or any other  symptomology. Evaluation in the emergency room included a severe leukocytosis with a white count 25,000, a chest x-ray with bronchitis and COPD, but no definite infiltrate. After several nebulizer treatments, intravenous steroid was given by the emergency room physician, and hospitalist was asked to admit patient for COPD exacerbation and possible influenza infection. The patient was continued on steroids intravenously and bronchodilators with continuous improvement in her symptoms. Sputum culture grew normal oropharyngeal flora. Patient was continued on Tamiflu, and her steroids were tapered. She felt markedly better by hospital day #3 and requested to be discharged home.   Discharge Exam: Blood pressure 126/79, pulse 84, temperature 97.5 F (36.4 C), temperature source Oral, resp. rate 18, height 5' (1.524 m), weight 81.375 kg (179 lb 6.4 oz), SpO2 97.00%. Alert oriented x3 No wheezes rhonchi or crackles Heart regular without murmurs rubs or gallop  Disposition: Home  Discharge Orders    Future Appointments: Provider: Department: Dept Phone: Center:   11/26/2011 2:45 PM Leslye Peer, MD [facility] [phone] None     Future Orders Please Complete By Expires   Diet - low sodium heart healthy      Increase activity slowly         Follow-up Information    Follow up with Leslye Peer., MD. (as previously scheduled)    Contact information:   [address] [employer] [place] [address] [phone]          Signed: Lonia Blood 11/24/2011, 5:38 PM

## 2011-11-25 LAB — CULTURE, RESPIRATORY W GRAM STAIN

## 2011-11-26 ENCOUNTER — Ambulatory Visit (INDEPENDENT_AMBULATORY_CARE_PROVIDER_SITE_OTHER): Payer: Medicaid Other | Admitting: Emergency Medicine

## 2011-11-26 ENCOUNTER — Encounter: Payer: Self-pay | Admitting: Emergency Medicine

## 2011-11-26 DIAGNOSIS — Z23 Encounter for immunization: Secondary | ICD-10-CM

## 2011-11-26 DIAGNOSIS — J449 Chronic obstructive pulmonary disease, unspecified: Secondary | ICD-10-CM

## 2011-11-26 DIAGNOSIS — B379 Candidiasis, unspecified: Secondary | ICD-10-CM | POA: Insufficient documentation

## 2011-11-26 MED ORDER — FLUCONAZOLE 100 MG PO TABS: ORAL_TABLET | ORAL | Status: AC

## 2011-11-26 NOTE — Assessment & Plan Note (Signed)
Same regimen, flu shot today in case her recent viral syndrome was not flu

## 2011-11-26 NOTE — Progress Notes (Signed)
Addended by: Ozella Almond R on: 11/26/2011 03:56 PM   Modules accepted: Orders

## 2011-11-26 NOTE — Patient Instructions (Addendum)
Please continue your Spiriva, Advair and ventolin Congratulations on stopping smoking! Don't restart -  Call our office if you are thinking about restarting.  Wear your oxygen set on 2L/min Flu shot today Take fluconazole as directed.  Follow with Dr Delton Coombes in 3 months

## 2011-11-26 NOTE — Progress Notes (Signed)
Subjective:    Patient ID: [name], female    DOB: 1966-10-18, 46 y.o.   MRN: [id number]  HPI 46 yo woman, heavy smoker, treated 1-2/11 for necrotic PNA and ARDS, resp failure. Also w hx substance abuse, narcotics use for back pain and Bipolar disorder  03/19/2011 Hospital follow up  PT returns today for hospital follow up. She was admitted 4/5-4/11/12 for AECOPD, Acute on chronic resp failure and Pneumonia-Hospital associated with septic shock. She did require vent support /5-4/7 (self extubated). Tx with broad spectrum abx., IV steroids, and nebs. CXR showed right >left aspdz (basilar). Pan cultures were neg. Initially required pressor support and fluid challenge with good response and was weaned off. She was discharged on steroid taper. She is still smoking and has recurrent hospitalizations. Says she is trying to quit, no cigs for few days.  CXR follow up on 4/11 showed resolved infiltrate.   Since discharge, she is feeling better but very tired. Has finished prednisone taper. Taking Advair Twice daily   And Spiriva daily.  Wheezing has resolved. Cough is better mainly dry.   ROV 04/29/11 -- severe COPD, tobacco abuse, recent admit for CAP and sepsis. Has been managed on Advair and Spiriva.  She has done OK since d/c and last visit, no Pred or abx or hosp since last time. She has been active, does have to walk if she gets in a rush. She can't walk through [place] but does walk thru the grocery store. Some cough in the am, prod of white phlegm. She is smoking 1/2 pk/day (down from 2pk a day). She is interested in stopping.   ROV 07/03/11 -- severe COPD, tobacco use. Regular f/u visit. Just treated by PCP with pred taper for low back pain (DJD, disc dz). The pred helped w some of her chest tightness. On Advair + Spiriva, using SABA about 1 -2 x a day. She has cut her cigarettes down significantly, using nicotine patches.    ROV 10/08/11 -- severe COPD, tobacco use. Hx of necrotizing PNA in the  past, freq hospitalizations. She began to have congestion and HA, URI sx about a weeks ago. She quit smoking end of September!!   Acute OV 10/30/2011 Complains of productive cough with clear and yellow and congestion for 5 days with increased SOB, wheezing . OTC not helping. No hemoptysis . Has a lot of sinus congestion . No ER or hosp. Admits since last ov.  Has not smoked for 2 months. Doing well on Spiriva and Symbicort.   ROV 11/25/10 -- Severe COPD, hx necrotizing PNA, freq exacerbations. Seen here a month ago after a flare, then was hospitalized last week for viral syndrome, possible flu, AE-COPD, tachycardia. Was rx with steroids, tamiflu, abx. She is still finishing levaquin. Tapering steroids. Unsure if her flu was positive from recent hospitalization. 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 C/o thrush today after being on abx and steroids.    Objective:   Physical Exam GEN: A/Ox3; pleasant , NAD, overweight   HEENT:  [place]/AT,  EACs-clear, TMs-wnl, NOSE-clear drainage , THROAT-clear, no lesions, no postnasal drip or exudate noted.   NECK:  Supple w/ fair ROM; no JVD; normal carotid impulses w/o bruits; no thyromegaly or nodules palpated; no lymphadenopathy.  RESP : coarse BS w/ faint exp wheezing   CARD:  RRR, no m/r/g  , no peripheral edema, pulses intact, no cyanosis or clubbing.  Musco: Warm bil, no deformities or joint swelling noted.  Neuro: alert, no focal deficits noted.    Skin: Warm, no lesions or rashes   Assessment & Plan:  Candida infection flucaonzole x 4 days.   COPD Same regimen, flu shot today in case her recent viral syndrome was not flu

## 2011-11-26 NOTE — Assessment & Plan Note (Signed)
flucaonzole x 4 days.

## 2011-11-28 IMAGING — CR DG CHEST 2V
2 series · 2 of 2 positions shown · non-contrast
Comparison: 10/18/2010

CLINICAL DATA: Shortness of breath

CHEST - 2 VIEW

[w chest pa]
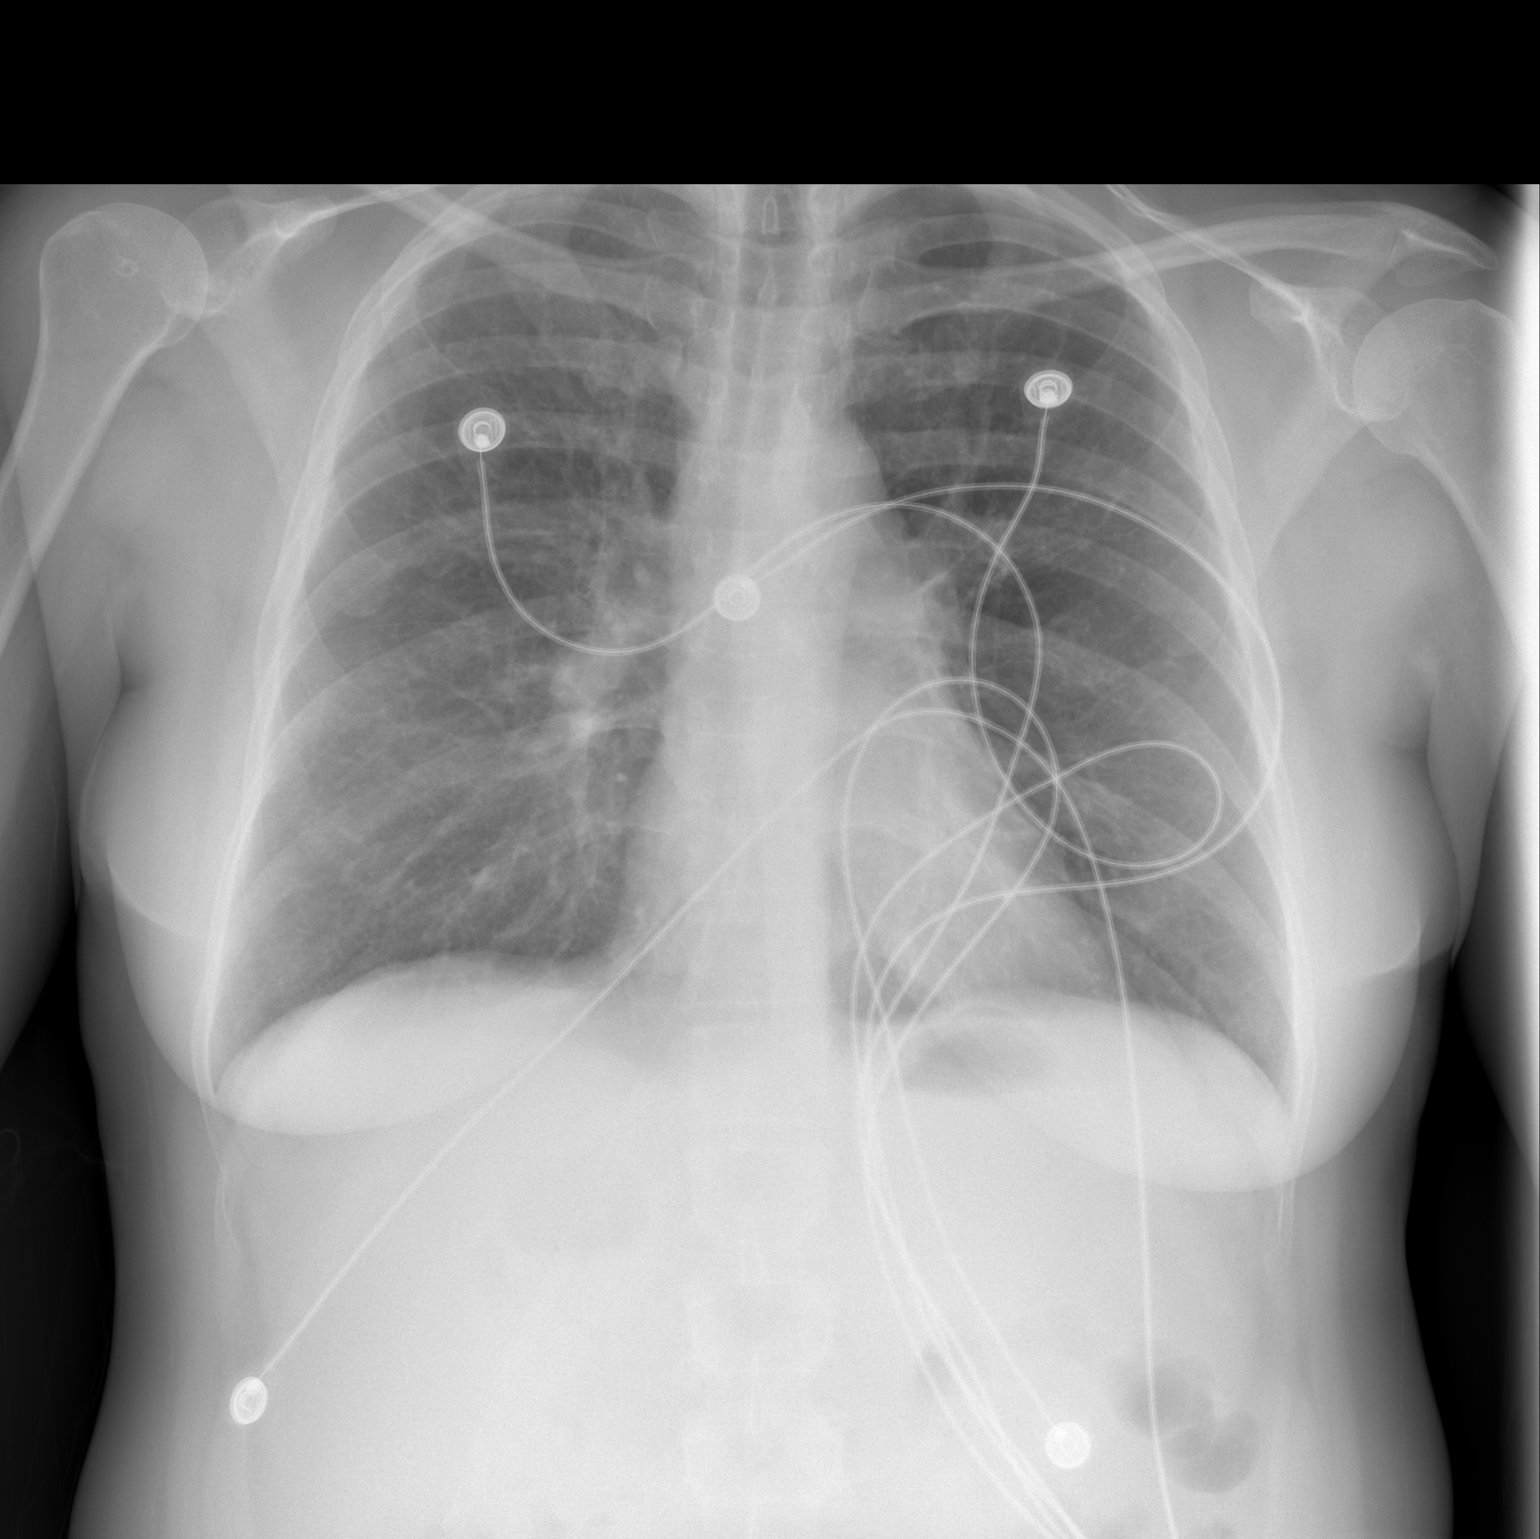

[w chest lat]
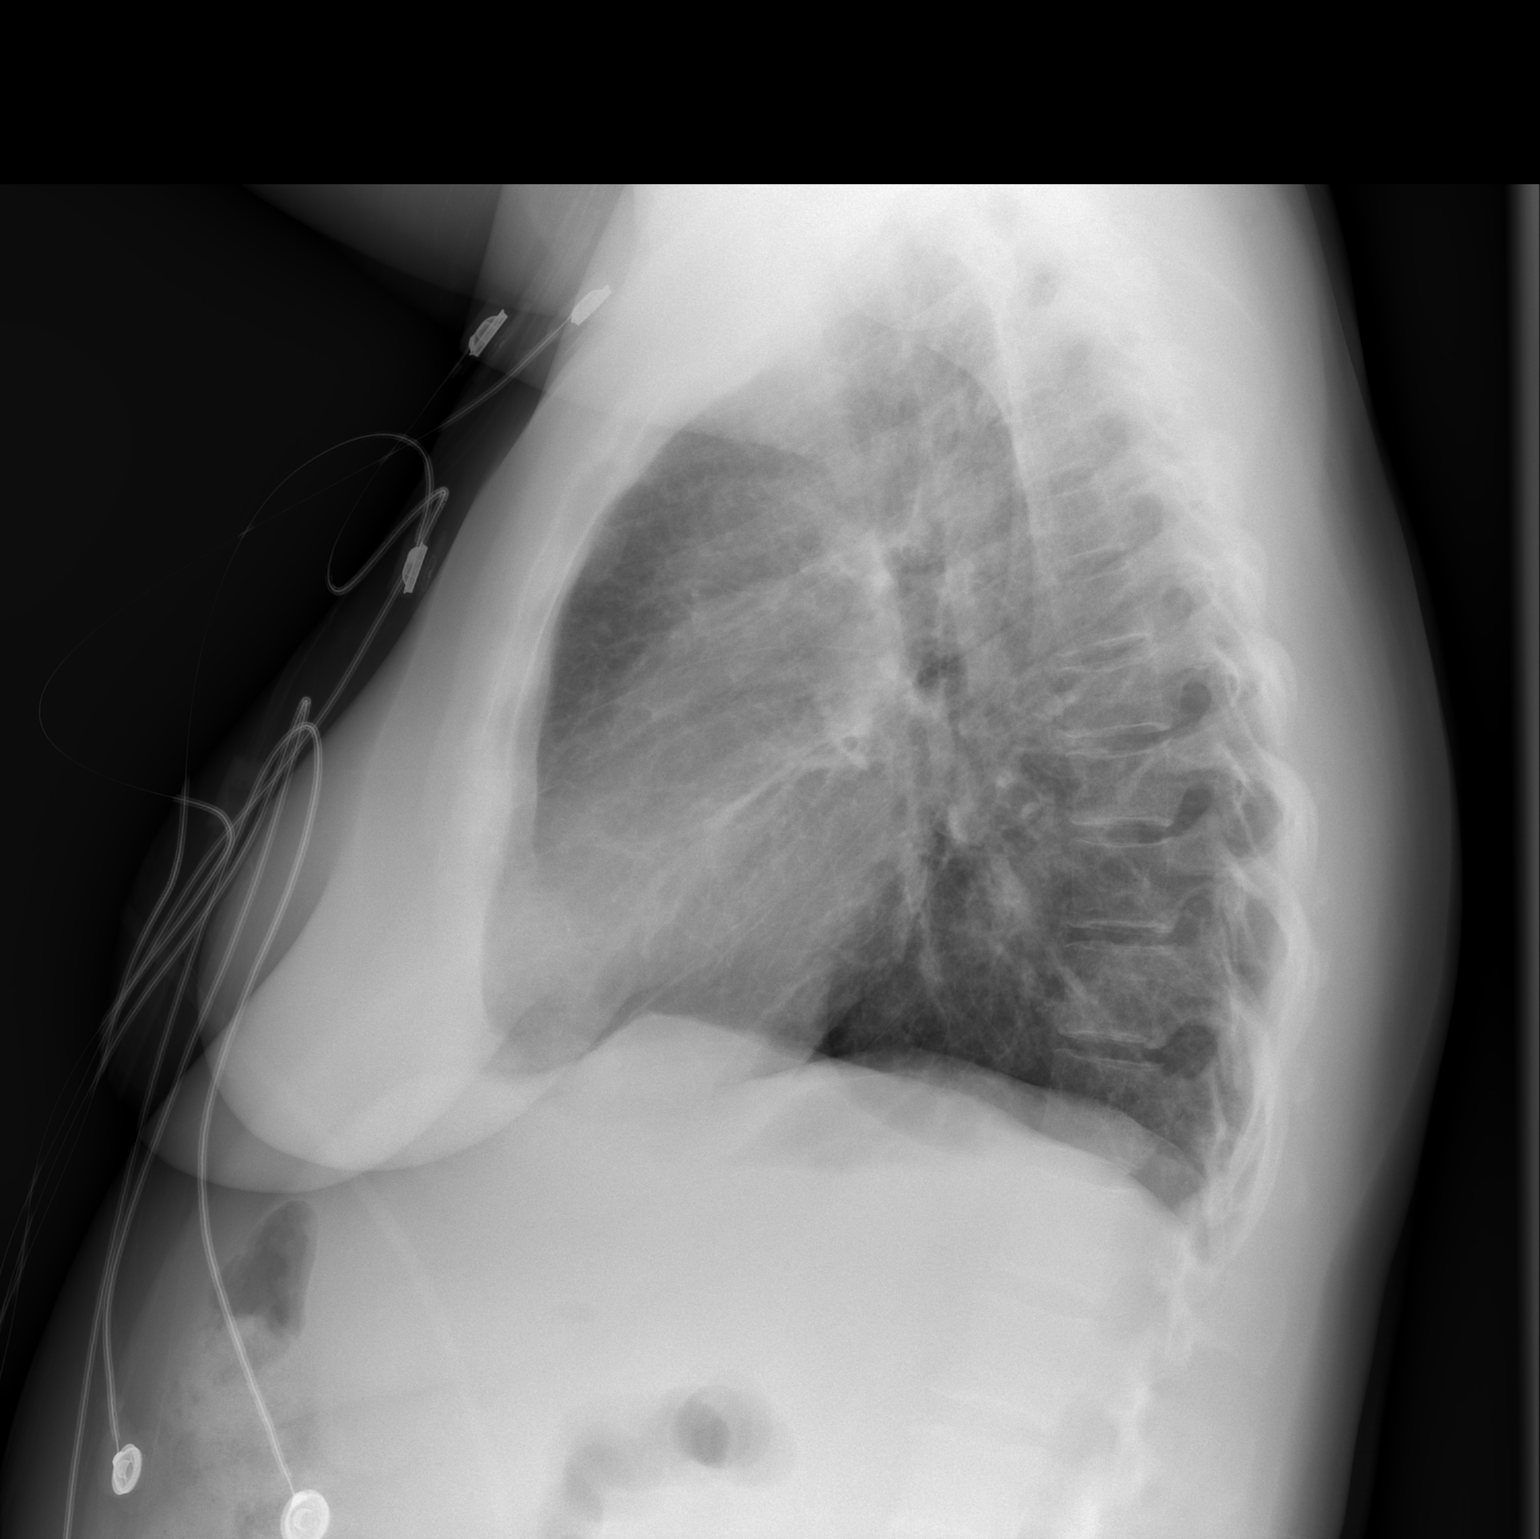

[2 of 2 positions shown; findings below may reference images not displayed]

FINDINGS: Chronic interstitial prominence is seen throughout both
lungs and the lungs are mildly hyperexpanded.  No confluent
airspace masses, effusions or pneumothoraces are seen.  The heart
is normal in size.  The osseous structures are unchanged.  The
upper abdomen is normal.
IMPRESSION: Chronic changes without acute cardiopulmonary disease.

## 2012-01-05 IMAGING — CR DG CHEST 1V PORT
1 series · 1 of 1 positions shown · non-contrast
Comparison: 10/23/2010 and earlier.

CLINICAL DATA: 44-year-old female with chest pain, shortness of
breath, coughing up bright red blood.

PORTABLE CHEST - 1 VIEW

[AP]
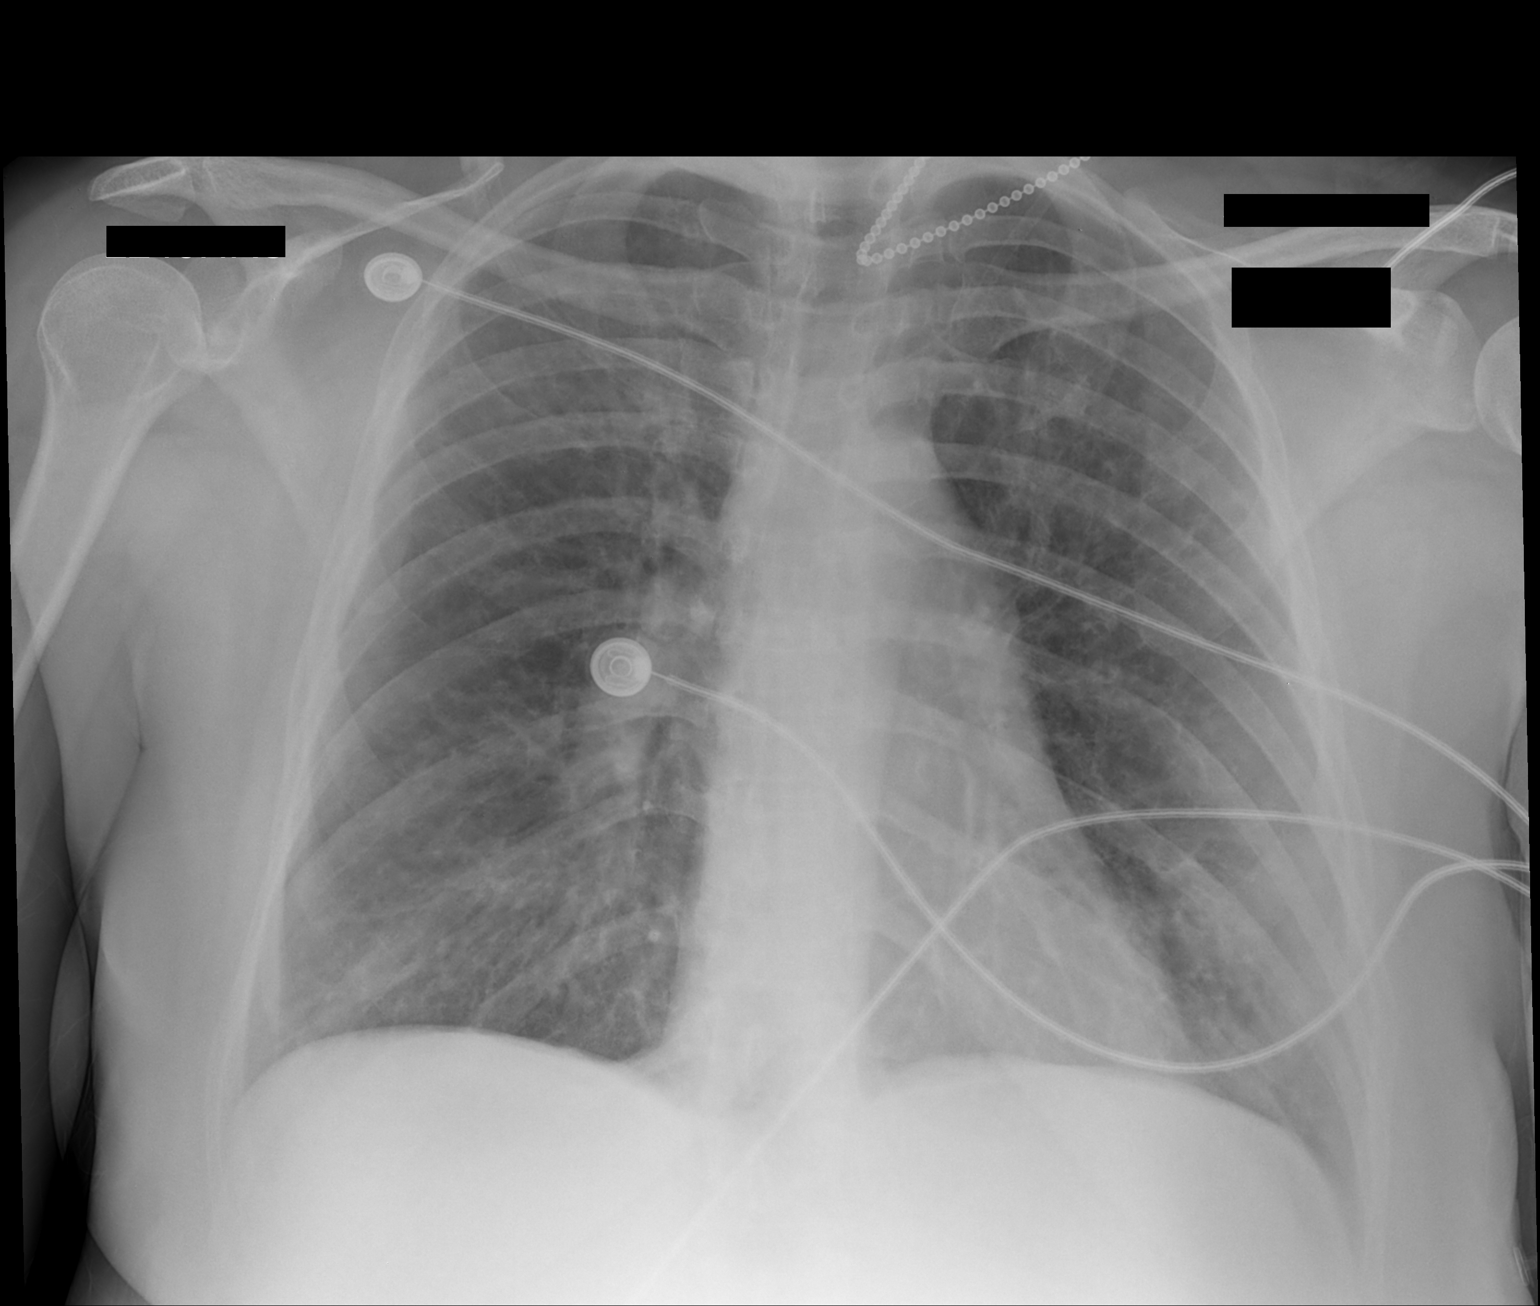

[1 of 1 positions shown; findings below may reference images not displayed]

FINDINGS: Portable semi upright AP view 6692 hours.  Stable lung
volumes.  Cardiac size and mediastinal contours are within normal
limits.  Coarse bilateral pulmonary markings re-identified and
stable since the prior exam.  No pneumothorax, pulmonary edema,
pleural effusion or acute confluent pulmonary opacity.
IMPRESSION: Stable with coarse bilateral pulmonary markings. No superimposed
acute findings are identified.  See chest CT report 12/09/2009.

## 2012-01-07 IMAGING — CR DG CHEST 2V
2 series · 2 of 2 positions shown · non-contrast
Comparison: 11/30/2010

CLINICAL DATA: COPD exacerbation

CHEST - 2 VIEW

[w chest pa]
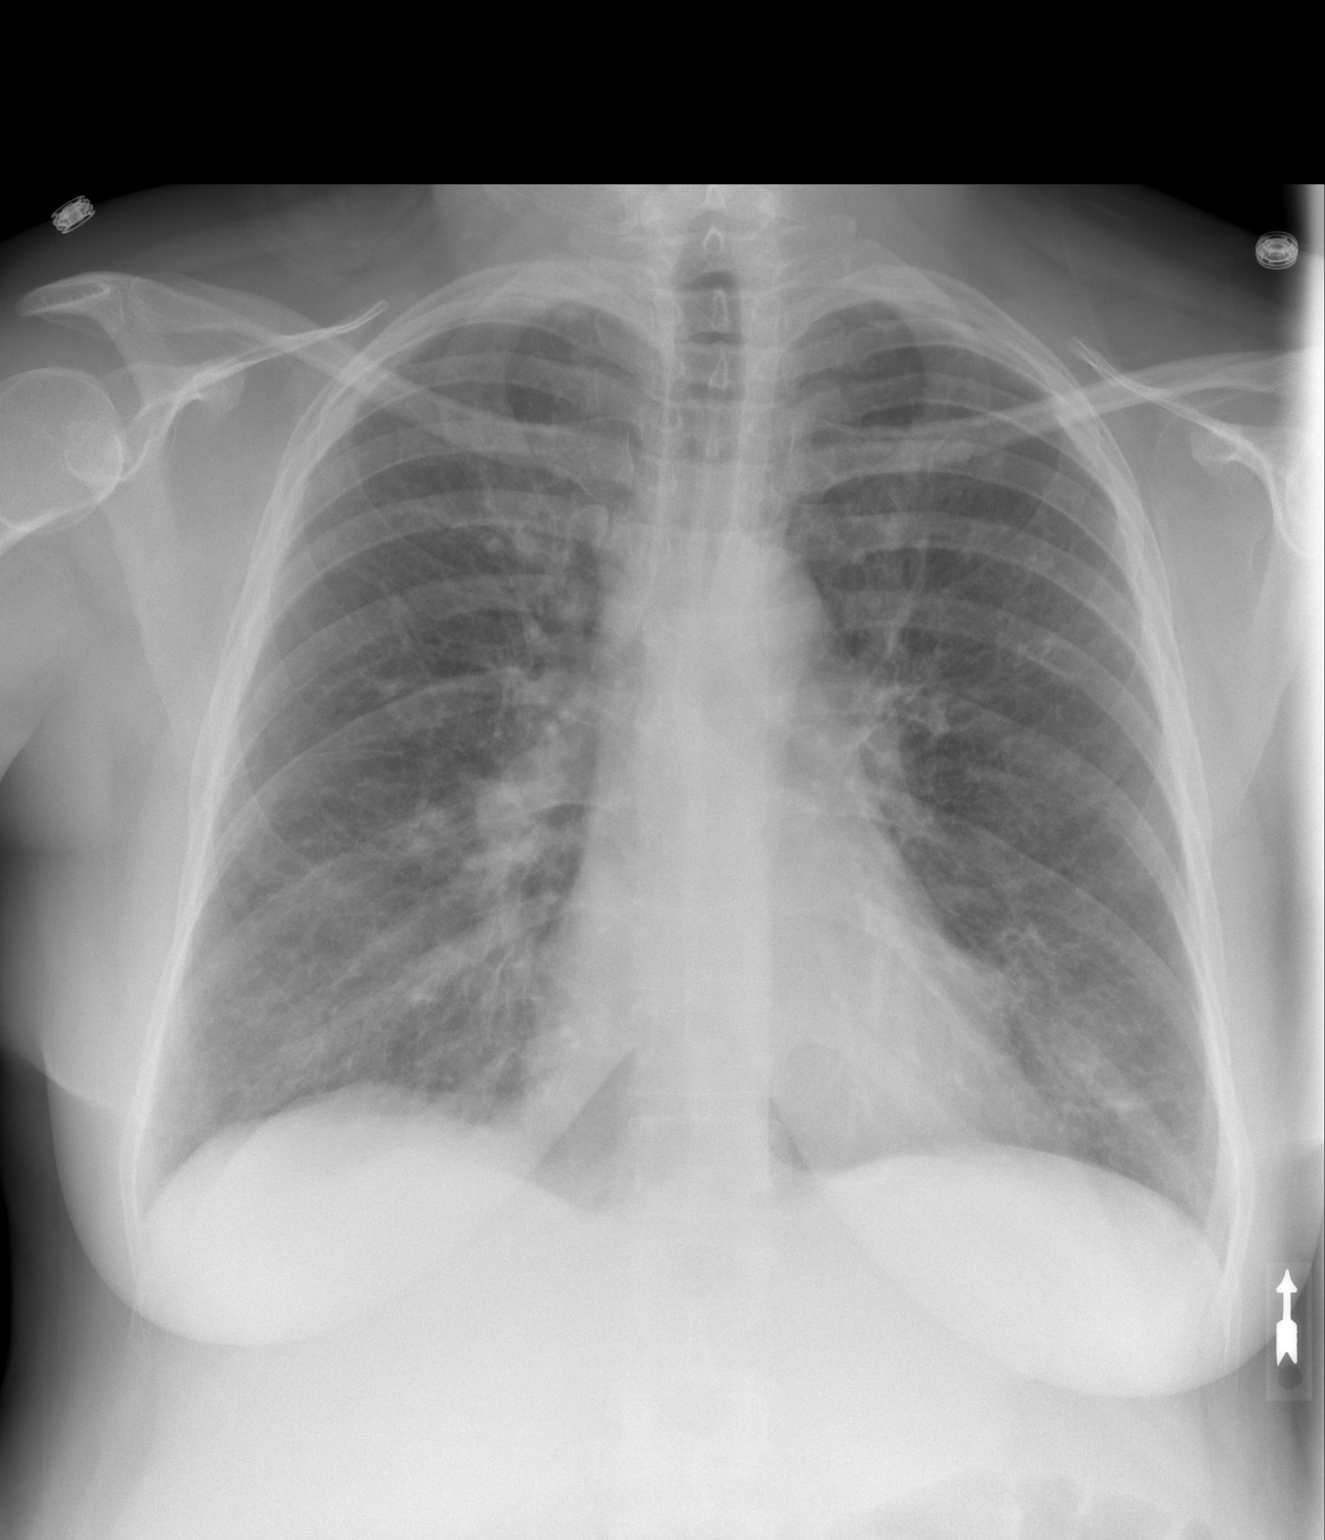

[w chest lat]
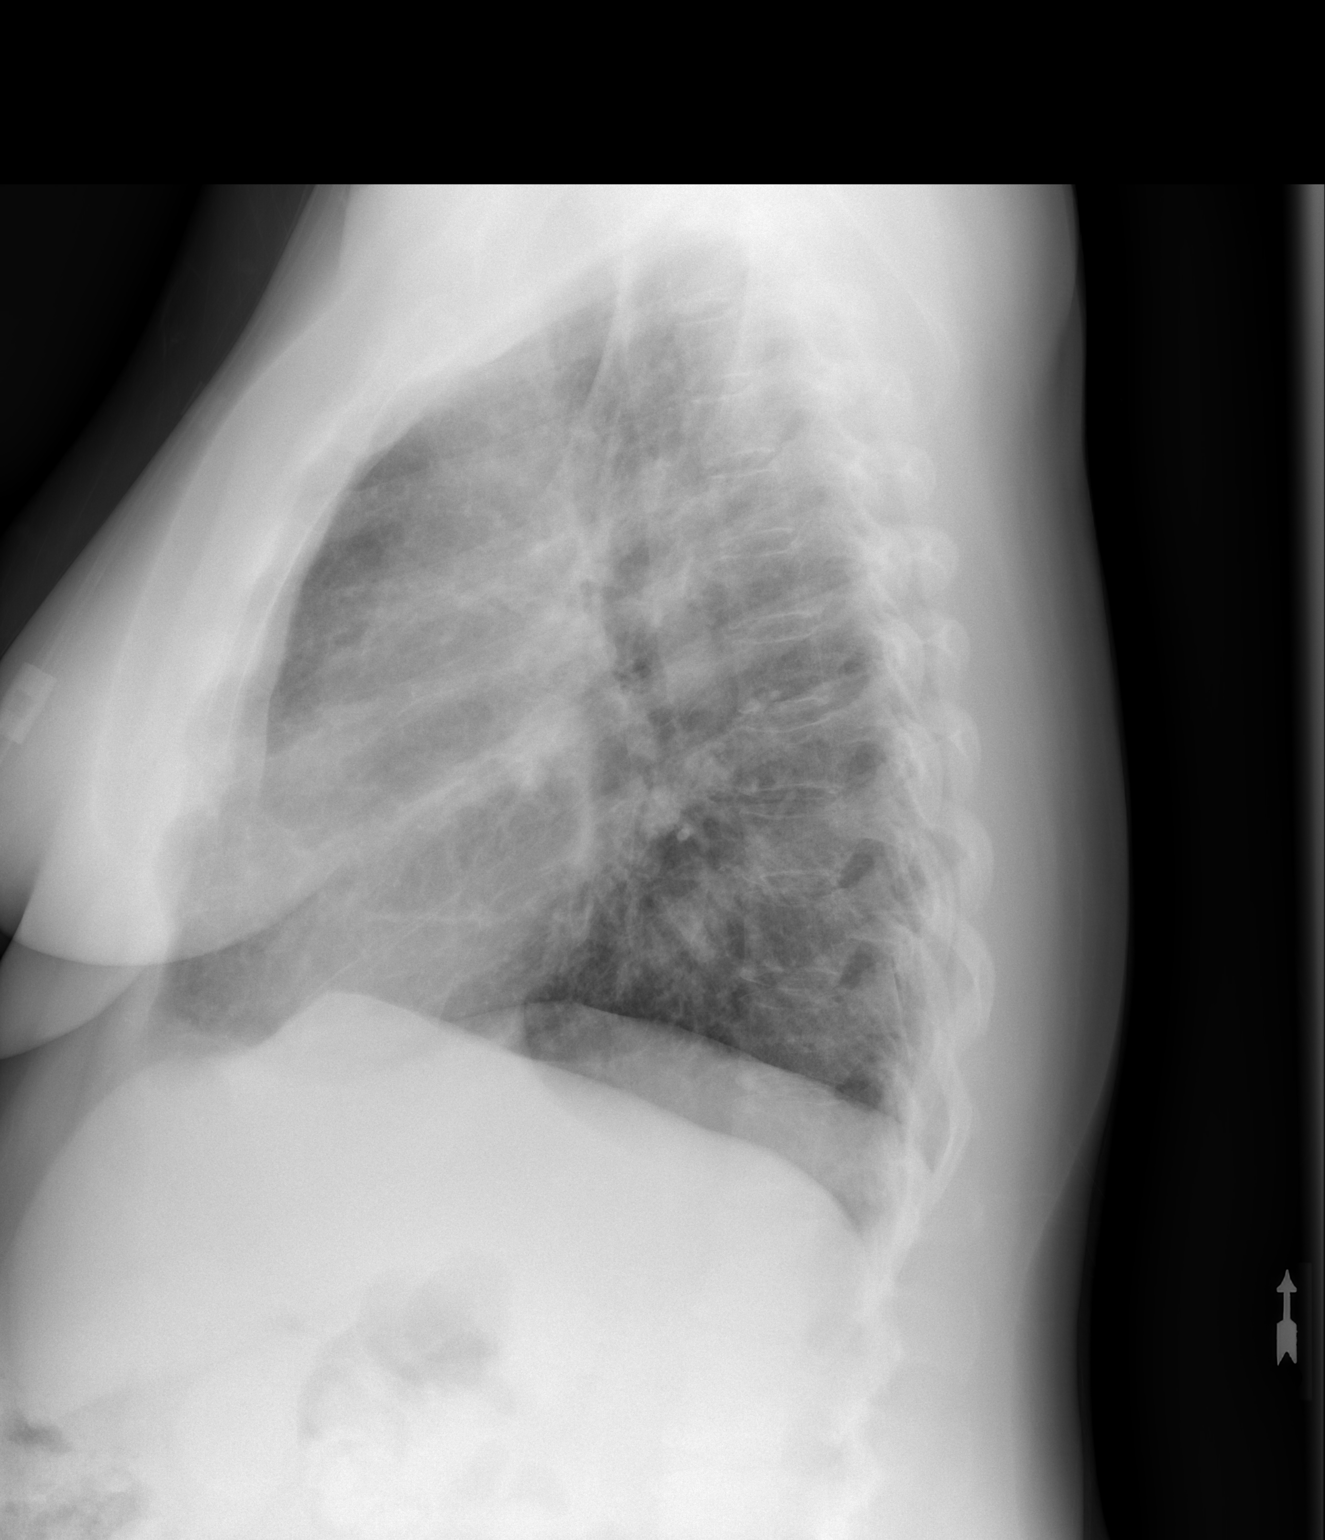

[2 of 2 positions shown; findings below may reference images not displayed]

FINDINGS: Heart size is normal.

There is no pleural effusion identified.

Bronchitic changes are noted throughout both lungs.

No focal airspace disease identified.

Review of the visualized osseous structures is unremarkable.
IMPRESSION: 1.  Chronic bronchitic changes.
2.  No evidence for pneumonia.

## 2012-01-26 ENCOUNTER — Other Ambulatory Visit: Payer: Self-pay | Admitting: Emergency Medicine

## 2012-01-27 IMAGING — CR DG CHEST 2V
2 series · 2 of 2 positions shown · non-contrast
Comparison: 12/02/2010

CLINICAL DATA: Menometrorrhagia, dysmenorrhea, preoperative
assessment, history shortness of breath, chest pain,
COPD/emphysema, smoking

CHEST - 2 VIEW

[view not recorded (1 of 2)]
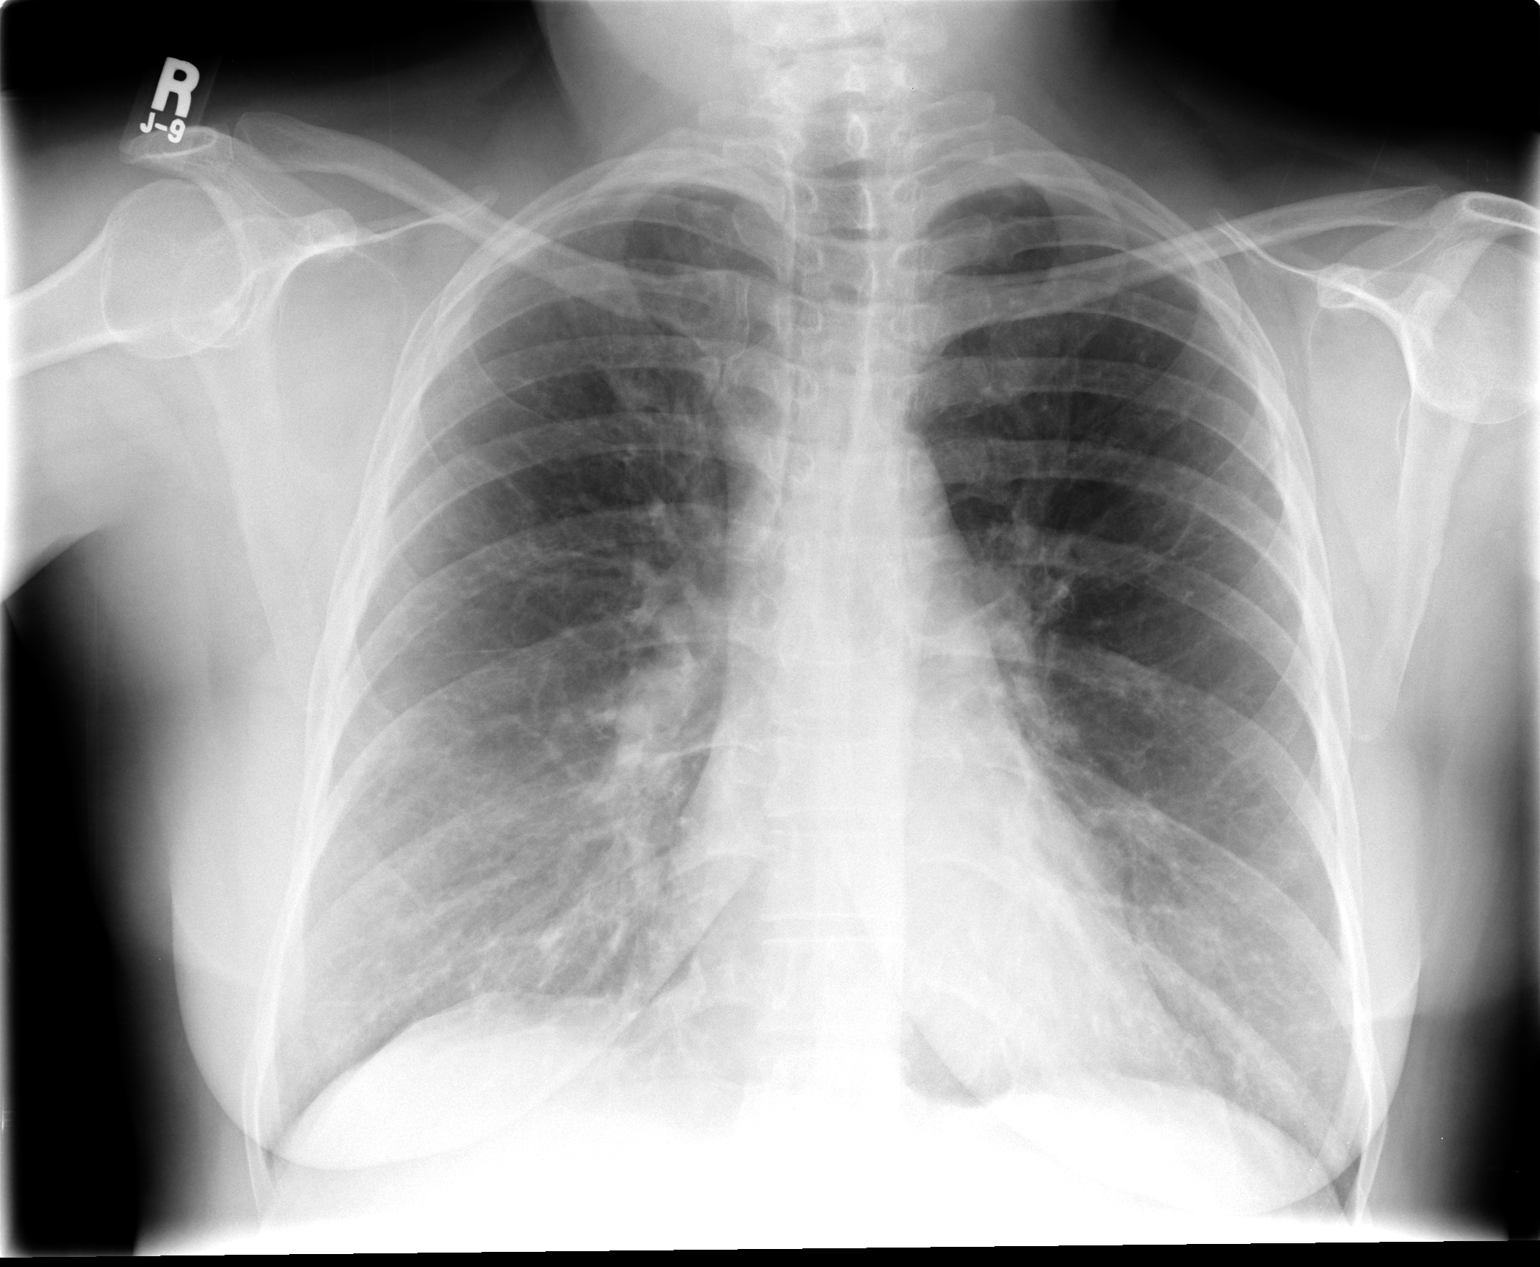

[view not recorded (2 of 2)]
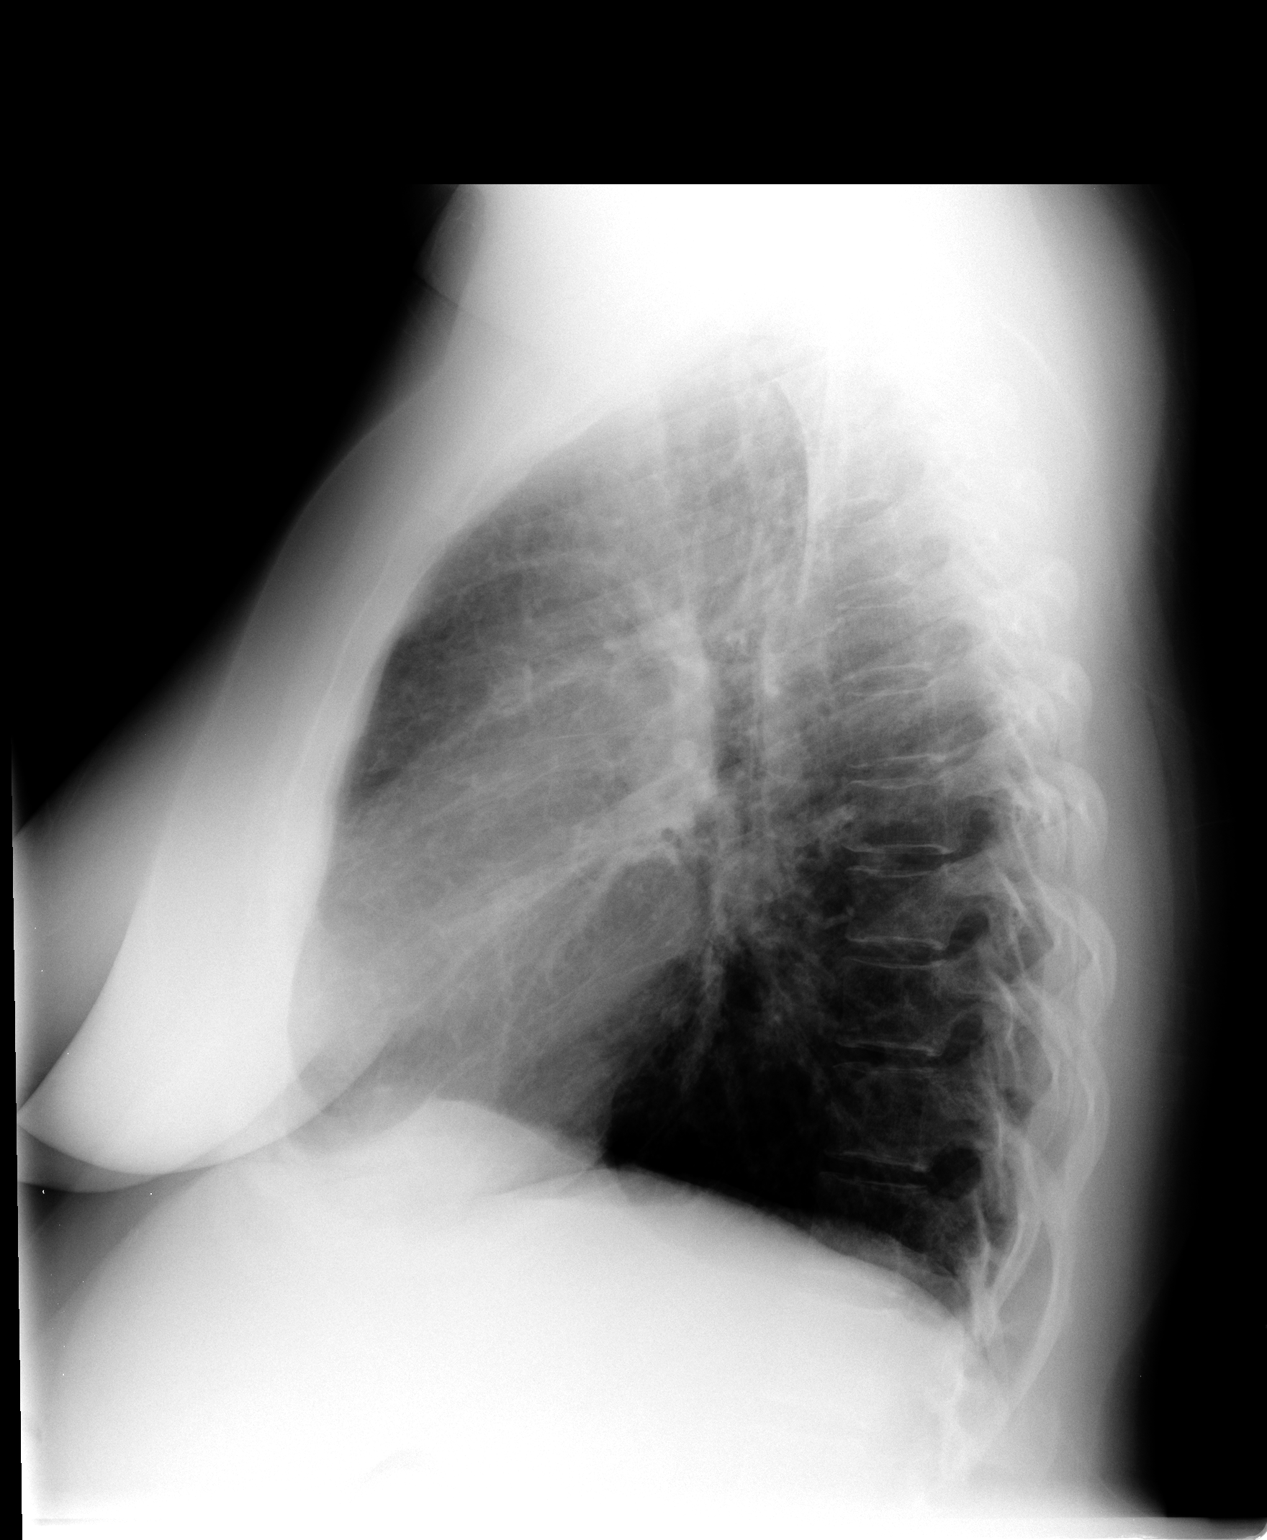

[2 of 2 positions shown; findings below may reference images not displayed]

FINDINGS: Upper normal-size of cardiac silhouette.
Mediastinal contours and pulmonary vascularity normal.
Chronic bronchitic changes and accentuation of perihilar markings
stable.
No acute infiltrate, pleural effusion, or pneumothorax.
No acute osseous findings.
IMPRESSION: Chronic bronchitic changes.
No acute abnormalities.

## 2012-02-03 ENCOUNTER — Encounter (HOSPITAL_COMMUNITY): Payer: Self-pay

## 2012-02-03 ENCOUNTER — Emergency Department (HOSPITAL_COMMUNITY): Payer: Medicaid Other

## 2012-02-03 ENCOUNTER — Inpatient Hospital Stay (HOSPITAL_COMMUNITY)
Admission: EM | Admit: 2012-02-03 | Discharge: 2012-02-08 | DRG: 190 | Disposition: A | Discharge status: Home / Self Care | Payer: Medicaid Other | Attending: Internal Medicine | Admitting: Internal Medicine

## 2012-02-03 ENCOUNTER — Other Ambulatory Visit: Payer: Self-pay

## 2012-02-03 DIAGNOSIS — F17200 Nicotine dependence, unspecified, uncomplicated: Secondary | ICD-10-CM | POA: Diagnosis present

## 2012-02-03 DIAGNOSIS — J441 Chronic obstructive pulmonary disease with (acute) exacerbation: Principal | ICD-10-CM | POA: Diagnosis present

## 2012-02-03 DIAGNOSIS — M25569 Pain in unspecified knee: Secondary | ICD-10-CM | POA: Diagnosis not present

## 2012-02-03 DIAGNOSIS — Z9981 Dependence on supplemental oxygen: Secondary | ICD-10-CM

## 2012-02-03 DIAGNOSIS — E119 Type 2 diabetes mellitus without complications: Secondary | ICD-10-CM | POA: Diagnosis present

## 2012-02-03 DIAGNOSIS — J449 Chronic obstructive pulmonary disease, unspecified: Secondary | ICD-10-CM | POA: Diagnosis present

## 2012-02-03 DIAGNOSIS — K5900 Constipation, unspecified: Secondary | ICD-10-CM | POA: Diagnosis not present

## 2012-02-03 DIAGNOSIS — R05 Cough: Secondary | ICD-10-CM | POA: Diagnosis present

## 2012-02-03 DIAGNOSIS — F319 Bipolar disorder, unspecified: Secondary | ICD-10-CM | POA: Diagnosis present

## 2012-02-03 DIAGNOSIS — R110 Nausea: Secondary | ICD-10-CM

## 2012-02-03 DIAGNOSIS — R059 Cough, unspecified: Secondary | ICD-10-CM | POA: Diagnosis present

## 2012-02-03 DIAGNOSIS — G894 Chronic pain syndrome: Secondary | ICD-10-CM | POA: Diagnosis present

## 2012-02-03 DIAGNOSIS — J189 Pneumonia, unspecified organism: Secondary | ICD-10-CM | POA: Diagnosis present

## 2012-02-03 HISTORY — DX: Pneumonia, unspecified organism: J18.9

## 2012-02-03 HISTORY — DX: Angina pectoris, unspecified: I20.9

## 2012-02-03 HISTORY — DX: Acute upper respiratory infection, unspecified: J06.9

## 2012-02-03 HISTORY — DX: Shortness of breath: R06.02

## 2012-02-03 LAB — HEPATIC FUNCTION PANEL
ALT: 16 U/L (ref 0–35)
AST: 18 U/L (ref 0–37)
Albumin: 3.7 g/dL (ref 3.5–5.2)
Alkaline Phosphatase: 46 U/L (ref 39–117)
Bilirubin, Direct: <0.1 mg/dL (ref 0.0–0.3)
Total Bilirubin: 0.2 mg/dL — ABNORMAL LOW (ref 0.3–1.2)
Total Protein: 7.2 g/dL (ref 6.0–8.3)

## 2012-02-03 LAB — URINALYSIS, ROUTINE W REFLEX MICROSCOPIC
Bilirubin Urine: NEGATIVE
Glucose, UA: NEGATIVE mg/dL
Hgb urine dipstick: NEGATIVE
Ketones, ur: NEGATIVE mg/dL
Leukocytes, UA: NEGATIVE
Nitrite: NEGATIVE
Protein, ur: NEGATIVE mg/dL
Specific Gravity, Urine: 1.01 (ref 1.005–1.030)
Urobilinogen, UA: 0.2 mg/dL (ref 0.0–1.0)
pH: 6.5 (ref 5.0–8.0)

## 2012-02-03 LAB — CBC
HCT: 39.7 % (ref 36.0–46.0)
Hemoglobin: 12.9 g/dL (ref 12.0–15.0)
MCH: 26.9 pg (ref 26.0–34.0)
MCHC: 32.5 g/dL (ref 30.0–36.0)
MCV: 82.9 fL (ref 78.0–100.0)
Platelets: 398 10*3/uL (ref 150–400)
RBC: 4.79 MIL/uL (ref 3.87–5.11)
RDW: 13.9 % (ref 11.5–15.5)
WBC: 9.9 K/uL (ref 4.0–10.5)

## 2012-02-03 LAB — BASIC METABOLIC PANEL
Calcium: 9.7 mg/dL (ref 8.4–10.5)
GFR calc Af Amer: >90 mL/min (ref >90)
GFR calc non Af Amer: >90 mL/min (ref >90)
Potassium: 4.2 mEq/L (ref 3.5–5.1)
Sodium: 136 mEq/L (ref 135–145)

## 2012-02-03 LAB — POCT I-STAT TROPONIN I: Troponin i, poc: 0 ng/mL (ref 0.00–0.08)

## 2012-02-03 LAB — BASIC METABOLIC PANEL WITH GFR
BUN: 7 mg/dL (ref 6–23)
CO2: 29 meq/L (ref 19–32)
Chloride: 101 meq/L (ref 96–112)
Creatinine, Ser: 0.56 mg/dL (ref 0.50–1.10)
Glucose, Bld: 88 mg/dL (ref 70–99)

## 2012-02-03 LAB — DIFFERENTIAL
Basophils Absolute: 0 10*3/uL (ref 0.0–0.1)
Basophils Relative: 0 % (ref 0–1)
Eosinophils Absolute: 0.2 10*3/uL (ref 0.0–0.7)
Eosinophils Relative: 2 % (ref 0–5)
Lymphocytes Relative: 25 % (ref 12–46)
Lymphs Abs: 2.5 K/uL (ref 0.7–4.0)
Monocytes Absolute: 1 K/uL (ref 0.1–1.0)
Monocytes Relative: 11 % (ref 3–12)
Neutro Abs: 6.1 K/uL (ref 1.7–7.7)
Neutrophils Relative %: 62 % (ref 43–77)

## 2012-02-03 LAB — GLUCOSE, CAPILLARY
Glucose-Capillary: 87 mg/dL (ref 70–99)
Glucose-Capillary: 359 mg/dL — ABNORMAL HIGH (ref 70–99)

## 2012-02-03 LAB — LIPASE, BLOOD: Lipase: 23 U/L (ref 11–59)

## 2012-02-03 MED ORDER — IPRATROPIUM BROMIDE 0.02 % IN SOLN
0.5000 mg | Freq: Once | RESPIRATORY_TRACT | Status: AC
  Administered 2012-02-03: 0.5 mg via RESPIRATORY_TRACT
  Filled 2012-02-03: qty 2.5

## 2012-02-03 MED ORDER — ONDANSETRON HCL 4 MG/2ML IJ SOLN
4.0000 mg | Freq: Once | INTRAMUSCULAR | Status: AC
  Administered 2012-02-03: 4 mg via INTRAVENOUS
  Filled 2012-02-03: qty 2

## 2012-02-03 MED ORDER — DEXTROSE 5 % IV SOLN
2.0000 g | Freq: Once | INTRAVENOUS | Status: AC
  Administered 2012-02-03: 2 g via INTRAVENOUS
  Filled 2012-02-03: qty 2

## 2012-02-03 MED ORDER — HYDROMORPHONE HCL PF 1 MG/ML IJ SOLN
0.5000 mg | Freq: Once | INTRAMUSCULAR | Status: AC
  Administered 2012-02-03: 0.5 mg via INTRAVENOUS
  Filled 2012-02-03: qty 1

## 2012-02-03 MED ORDER — ONDANSETRON HCL 4 MG/2ML IJ SOLN
4.0000 mg | Freq: Four times a day (QID) | INTRAMUSCULAR | Status: DC | PRN
  Administered 2012-02-03 – 2012-02-08 (×6): 4 mg via INTRAVENOUS
  Filled 2012-02-03 (×6): qty 2

## 2012-02-03 MED ORDER — SODIUM CHLORIDE 0.9 % IV BOLUS (SEPSIS)
1000.0000 mL | Freq: Once | INTRAVENOUS | Status: AC
  Administered 2012-02-03: 1000 mL via INTRAVENOUS

## 2012-02-03 MED ORDER — GLYBURIDE 5 MG PO TABS
5.0000 mg | ORAL_TABLET | Freq: Two times a day (BID) | ORAL | Status: DC
  Administered 2012-02-03 – 2012-02-08 (×10): 5 mg via ORAL
  Filled 2012-02-03 (×10): qty 1

## 2012-02-03 MED ORDER — LEVOFLOXACIN IN D5W 750 MG/150ML IV SOLN
750.0000 mg | Freq: Once | INTRAVENOUS | Status: AC
  Administered 2012-02-03: 750 mg via INTRAVENOUS
  Filled 2012-02-03: qty 150

## 2012-02-03 MED ORDER — IBUPROFEN 800 MG PO TABS
800.0000 mg | ORAL_TABLET | Freq: Four times a day (QID) | ORAL | Status: DC | PRN
Start: 2012-02-03 — End: 2012-02-08
  Filled 2012-02-03: qty 1

## 2012-02-03 MED ORDER — GLYBURIDE-METFORMIN 5-500 MG PO TABS: 1.0000 | ORAL_TABLET | Freq: Two times a day (BID) | ORAL | Status: DC

## 2012-02-03 MED ORDER — ACETAMINOPHEN 325 MG PO TABS
650.0000 mg | ORAL_TABLET | Freq: Once | ORAL | Status: AC
  Administered 2012-02-03: 650 mg via ORAL
  Filled 2012-02-03: qty 2

## 2012-02-03 MED ORDER — ALBUTEROL SULFATE (5 MG/ML) 0.5% IN NEBU
5.0000 mg | INHALATION_SOLUTION | Freq: Once | RESPIRATORY_TRACT | Status: AC
  Administered 2012-02-03: 5 mg via RESPIRATORY_TRACT
  Filled 2012-02-03: qty 1

## 2012-02-03 MED ORDER — FLUTICASONE-SALMETEROL 250-50 MCG/DOSE IN AEPB
1.0000 | INHALATION_SPRAY | Freq: Two times a day (BID) | RESPIRATORY_TRACT | Status: DC
  Administered 2012-02-03 – 2012-02-08 (×10): 1 via RESPIRATORY_TRACT
  Filled 2012-02-03 (×2): qty 14

## 2012-02-03 MED ORDER — OXYCODONE-ACETAMINOPHEN 5-325 MG PO TABS
2.0000 | ORAL_TABLET | ORAL | Status: DC | PRN
  Administered 2012-02-03 – 2012-02-08 (×24): 2 via ORAL
  Filled 2012-02-03 (×24): qty 2

## 2012-02-03 MED ORDER — SODIUM CHLORIDE 0.9 % IJ SOLN
INTRAMUSCULAR | Status: AC
  Administered 2012-02-03: 10 mL
  Filled 2012-02-03: qty 3

## 2012-02-03 MED ORDER — VANCOMYCIN HCL IN DEXTROSE 1-5 GM/200ML-% IV SOLN
1000.0000 mg | Freq: Two times a day (BID) | INTRAVENOUS | Status: DC
  Administered 2012-02-03 – 2012-02-05 (×4): 1000 mg via INTRAVENOUS
  Filled 2012-02-03 (×10): qty 200

## 2012-02-03 MED ORDER — TIOTROPIUM BROMIDE MONOHYDRATE 18 MCG IN CAPS
18.0000 ug | ORAL_CAPSULE | Freq: Every day | RESPIRATORY_TRACT | Status: DC
  Administered 2012-02-04 – 2012-02-08 (×5): 18 ug via RESPIRATORY_TRACT
  Filled 2012-02-03 (×2): qty 5

## 2012-02-03 MED ORDER — IOHEXOL 300 MG/ML  SOLN
100.0000 mL | Freq: Once | INTRAMUSCULAR | Status: AC | PRN
  Administered 2012-02-03: 100 mL via INTRAVENOUS

## 2012-02-03 MED ORDER — LEVOFLOXACIN IN D5W 750 MG/150ML IV SOLN
750.0000 mg | INTRAVENOUS | Status: DC
  Administered 2012-02-03 – 2012-02-06 (×4): 750 mg via INTRAVENOUS
  Filled 2012-02-03 (×5): qty 150

## 2012-02-03 MED ORDER — BIOTENE DRY MOUTH MT LIQD
15.0000 mL | Freq: Two times a day (BID) | OROMUCOSAL | Status: DC
  Administered 2012-02-03 – 2012-02-05 (×3): 15 mL via OROMUCOSAL

## 2012-02-03 MED ORDER — PREDNISONE 20 MG PO TABS
40.0000 mg | ORAL_TABLET | Freq: Every day | ORAL | Status: DC
  Administered 2012-02-03 – 2012-02-08 (×6): 40 mg via ORAL
  Filled 2012-02-03 (×6): qty 2

## 2012-02-03 MED ORDER — ALBUTEROL SULFATE (5 MG/ML) 0.5% IN NEBU: 2.5000 mg | INHALATION_SOLUTION | RESPIRATORY_TRACT | Status: DC | PRN

## 2012-02-03 MED ORDER — ACETAMINOPHEN 500 MG PO TABS: 1000.0000 mg | ORAL_TABLET | Freq: Four times a day (QID) | ORAL | Status: DC | PRN

## 2012-02-03 MED ORDER — TIZANIDINE HCL 4 MG PO TABS
4.0000 mg | ORAL_TABLET | Freq: Three times a day (TID) | ORAL | Status: DC
  Administered 2012-02-03 – 2012-02-08 (×15): 4 mg via ORAL
  Filled 2012-02-03 (×21): qty 1

## 2012-02-03 MED ORDER — VANCOMYCIN HCL IN DEXTROSE 1-5 GM/200ML-% IV SOLN
1000.0000 mg | Freq: Two times a day (BID) | INTRAVENOUS | Status: DC
  Filled 2012-02-03 (×2): qty 200

## 2012-02-03 MED ORDER — INSULIN ASPART 100 UNIT/ML ~~LOC~~ SOLN
0.0000 [IU] | Freq: Every day | SUBCUTANEOUS | Status: DC
  Administered 2012-02-03: 2 [IU] via SUBCUTANEOUS

## 2012-02-03 MED ORDER — METFORMIN HCL 500 MG PO TABS
500.0000 mg | ORAL_TABLET | Freq: Two times a day (BID) | ORAL | Status: DC
  Filled 2012-02-03 (×2): qty 1

## 2012-02-03 MED ORDER — SODIUM CHLORIDE 0.9 % IJ SOLN
INTRAMUSCULAR | Status: AC
  Filled 2012-02-03: qty 3

## 2012-02-03 MED ORDER — POTASSIUM CHLORIDE CRYS ER 20 MEQ PO TBCR
20.0000 meq | EXTENDED_RELEASE_TABLET | Freq: Two times a day (BID) | ORAL | Status: DC
  Administered 2012-02-03 – 2012-02-08 (×11): 20 meq via ORAL
  Filled 2012-02-03 (×11): qty 1

## 2012-02-03 MED ORDER — DEXTROSE 5 % IV SOLN
1.0000 g | Freq: Two times a day (BID) | INTRAVENOUS | Status: DC
  Administered 2012-02-03 – 2012-02-08 (×9): 1 g via INTRAVENOUS
  Filled 2012-02-03 (×12): qty 1

## 2012-02-03 MED ORDER — ALBUTEROL SULFATE HFA 108 (90 BASE) MCG/ACT IN AERS
2.0000 | INHALATION_SPRAY | RESPIRATORY_TRACT | Status: DC | PRN
  Administered 2012-02-03 – 2012-02-08 (×11): 2 via RESPIRATORY_TRACT
  Filled 2012-02-03: qty 6.7

## 2012-02-03 MED ORDER — HEPARIN SODIUM (PORCINE) 5000 UNIT/ML IJ SOLN
5000.0000 [IU] | Freq: Three times a day (TID) | INTRAMUSCULAR | Status: DC
  Administered 2012-02-03 – 2012-02-08 (×15): 5000 [IU] via SUBCUTANEOUS
  Filled 2012-02-03 (×15): qty 1

## 2012-02-03 MED ORDER — METHYLPREDNISOLONE SODIUM SUCC 125 MG IJ SOLR
125.0000 mg | Freq: Once | INTRAMUSCULAR | Status: AC
  Administered 2012-02-03: 125 mg via INTRAVENOUS
  Filled 2012-02-03: qty 2

## 2012-02-03 MED ORDER — FUROSEMIDE 20 MG PO TABS
20.0000 mg | ORAL_TABLET | Freq: Every day | ORAL | Status: DC
  Administered 2012-02-03 – 2012-02-08 (×6): 20 mg via ORAL
  Filled 2012-02-03 (×6): qty 1

## 2012-02-03 MED ORDER — VANCOMYCIN HCL IN DEXTROSE 1-5 GM/200ML-% IV SOLN: 1000.0000 mg | Freq: Once | INTRAVENOUS | Status: DC

## 2012-02-03 MED ORDER — INSULIN ASPART 100 UNIT/ML ~~LOC~~ SOLN
0.0000 [IU] | Freq: Three times a day (TID) | SUBCUTANEOUS | Status: DC
  Administered 2012-02-03: 15 [IU] via SUBCUTANEOUS
  Administered 2012-02-04: 4 [IU] via SUBCUTANEOUS
  Administered 2012-02-04: 11 [IU] via SUBCUTANEOUS
  Administered 2012-02-05 (×2): 7 [IU] via SUBCUTANEOUS
  Administered 2012-02-05: 3 [IU] via SUBCUTANEOUS
  Administered 2012-02-06 (×2): 4 [IU] via SUBCUTANEOUS
  Administered 2012-02-06 – 2012-02-07 (×3): 3 [IU] via SUBCUTANEOUS

## 2012-02-03 MED ORDER — FENOFIBRATE 160 MG PO TABS
160.0000 mg | ORAL_TABLET | Freq: Every day | ORAL | Status: DC
  Administered 2012-02-03 – 2012-02-08 (×6): 160 mg via ORAL
  Filled 2012-02-03 (×8): qty 1

## 2012-02-03 NOTE — H&P (Signed)
[name] MRN: [id number] DOB/AGE: [birth year] 46 y.o.  Admit date: 02/03/2012 Chief Complaint: Cough productive of purulent sputum, wheezing, fever subjectively. HPI: This 46 year old lady, who has COPD, oxygen dependence and previous history of ARDS associated with mechanical ventilation, presents with the above symptoms for the last 3 days. Evaluation in the emergency room showed the possibility of right lower lobe pneumonia by CT scan of the abdomen. The CT scan of the abdomen was done because of constipation, nausea and some vomiting which does not seem to be a primary feature of her symptoms now. Patient is diabetic. Patient has a history of bipolar disorder.  Past Medical History  Diagnosis Date  . Migraine headache   . On home O2   . COPD (chronic obstructive pulmonary disease)   . Bipolar disorder   . Chronic back pain   . Hyperglycemia, drug-induced     steroid induced hyperglycemia  . ARDS (adult respiratory distress syndrome)     Jan 2011  . Diabetes mellitus   . Pneumonia    Past Surgical History  Procedure Date  . Tubal ligation   . Uterine ablasion   . C-section   . Tracheostomy     decannulated 12/2009          Social History: Unfortunately, she continues to smoke cigarettes about 5-6 cigarettes a day.   Allergies:  Allergies  Allergen Reactions  . Penicillins Other (See Comments)    convulsions  . Morphine Nausea Only    Medications Prior to Admission  Medication Dose Route Frequency Provider Last Rate Last Dose  . acetaminophen (TYLENOL) tablet 650 mg  650 mg Oral Once Gavin Pound. Ghim, MD   650 mg at 02/03/12 1401  . albuterol (PROVENTIL) (5 MG/ML) 0.5% nebulizer solution 5 mg  5 mg Nebulization Once Gavin Pound. Ghim, MD   5 mg at 02/03/12 1120  . aztreonam (AZACTAM) 2 g in dextrose 5 % 50 mL IVPB  2 g Intravenous Once Gavin Pound. Ghim, MD      . HYDROmorphone (DILAUDID) injection 0.5 mg  0.5 mg Intravenous Once Gavin Pound. Ghim, MD   0.5 mg at  02/03/12 1103  . HYDROmorphone (DILAUDID) injection 0.5 mg  0.5 mg Intravenous Once Gavin Pound. Ghim, MD   0.5 mg at 02/03/12 1258  . iohexol (OMNIPAQUE) 300 MG/ML solution 100 mL  100 mL Intravenous Once PRN Gavin Pound. Ghim, MD   100 mL at 02/03/12 1218  . ipratropium (ATROVENT) nebulizer solution 0.5 mg  0.5 mg Nebulization Once Gavin Pound. Ghim, MD   0.5 mg at 02/03/12 1121  . Levofloxacin (LEVAQUIN) IVPB 750 mg  750 mg Intravenous Once Gavin Pound. Ghim, MD   750 mg at 02/03/12 1402  . methylPREDNISolone sodium succinate (SOLU-MEDROL) 125 MG injection 125 mg  125 mg Intravenous Once Gavin Pound. Ghim, MD   125 mg at 02/03/12 1104  . ondansetron (ZOFRAN) injection 4 mg  4 mg Intravenous Once Gavin Pound. Ghim, MD   4 mg at 02/03/12 1102  . ondansetron (ZOFRAN) injection 4 mg  4 mg Intravenous Once Gavin Pound. Ghim, MD   4 mg at 02/03/12 1147  . predniSONE (DELTASONE) tablet 40 mg  40 mg Oral Q breakfast Satoya Feeley C Zaryah Seckel, MD      . sodium chloride 0.9 % bolus 1,000 mL  1,000 mL Intravenous Once Gavin Pound. Ghim, MD   1,000 mL at 02/03/12 1057  . vancomycin (VANCOCIN) IVPB 1000 mg/200 mL premix  1,000 mg Intravenous Once  Gavin Pound. Ghim, MD       Medications Prior to Admission  Medication Sig Dispense Refill  . albuterol (PROVENTIL) (2.5 MG/3ML) 0.083% nebulizer solution Take 2.5 mg by nebulization every 4 (four) hours as needed. For shortness of breath       . Choline Fenofibrate (TRILIPIX) 135 MG capsule Take 135 mg by mouth daily.        . Fluticasone-Salmeterol (ADVAIR) 250-50 MCG/DOSE AEPB Inhale 1 puff into the lungs every 12 (twelve) hours.        . furosemide (LASIX) 20 MG tablet Take 20 mg by mouth daily.        [name] glyBURIDE-metformin (GLUCOVANCE) 5-500 MG per tablet Take 1 tablet by mouth 2 (two) times daily with a meal. When sugar is high.      . nystatin (MYCOSTATIN) 100000 UNIT/ML suspension Take 500,000 Units by mouth 4 (four) times daily as needed. Thrust      . oxyCODONE-acetaminophen  (PERCOCET) 10-325 MG per tablet Take 1 tablet by mouth every 4 (four) hours as needed. For pain      . potassium chloride SA (K-DUR,KLOR-CON) 20 MEQ tablet Take 20 mEq by mouth 2 (two) times daily.        [name] tiotropium (SPIRIVA) 18 MCG inhalation capsule Place 18 mcg into inhaler and inhale daily.        [name] tiZANidine (ZANAFLEX) 4 MG tablet Take 4 mg by mouth 3 (three) times daily.       . VENTOLIN HFA 108 (90 BASE) MCG/ACT inhaler INHALE 2 PUFFS INTO THE LUNGS EVERY 6 (SIX) HOURS AS NEEDED.  18 g  1       ZOX:WRUEA from the symptoms mentioned above,there are no other symptoms referable to all systems reviewed.  Physical Exam: Blood pressure 122/52, pulse 93, temperature 99.3 F (37.4 C), temperature source Oral, resp. rate 17, height 5\' 1"  (1.549 m), weight 81.194 kg (179 lb), SpO2 97.00%. She looks systemically well and is not toxic or septic clinically. There is no significant increased work of breathing. There is no peripheral or central cyanosis. Lung fields show bilateral wheezing with crackles more on the right lower zone but also some in the left lower zone. There is no bronchial breathing. Heart sounds are present and normal without murmurs. The jugular venous pressure not raised. Abdomen is soft and nontender. There are no masses. There is no hepatosplenomegaly. Neurologically, she is alert and orientated without any focal neurologic signs.    Basename 02/03/12 1031  WBC 9.9  NEUTROABS 6.1  HGB 12.9  HCT 39.7  MCV 82.9  PLT 398    Basename 02/03/12 1031  NA 136  K 4.2  CL 101  CO2 29  GLUCOSE 88  BUN 7  CREATININE 0.56  CALCIUM 9.7  MG --         Dg Chest 2 View  02/03/2012  *RADIOLOGY REPORT*  Clinical Data: Shortness of breath, emesis, constipation.  CHEST - 2 VIEW  Comparison: Chest x-ray 11/20/2011.  Findings: Lung volumes are normal.  Mild diffuse interstitial prominence is noted, decreased compared to the prior study 11/20/2011.  No consolidative airspace  disease.  No pleural effusions.  Heart size is normal.  Mediastinal contours are unremarkable.  IMPRESSION: 1.  Although there is mild diffuse interstitial prominence, this has significantly improved compared to the prior examination, and what is seen on today's studies favored to be a chronic finding. No definite radiographic evidence of acute cardiopulmonary disease otherwise noted.  Original Report  Authenticated By: Florencia Reasons, M.D.   Ct Abdomen Pelvis W Contrast  02/03/2012  *RADIOLOGY REPORT*  Clinical Data: Nausea vomiting.  Shortness of breath.  CT ABDOMEN AND PELVIS WITH CONTRAST  Technique:  Multidetector CT imaging of the abdomen and pelvis was performed following the standard protocol during bolus administration of intravenous contrast.  Contrast: OMNIPAQUE IOHEXOL 300 MG/ML IJ SOLN  Comparison: 10/11/2010  Findings: Subtle tree in bud opacity is seen in the right lower lobe.  No focal abnormalities seen in the liver or spleen.  The stomach, duodenum, pancreas, gallbladder, and adrenal glands are unremarkable.  Kidneys are unremarkable.  No abdominal aortic aneurysm.  There is no free fluid or lymphadenopathy in the abdomen.  Abdominal bowel loops are unremarkable.  Imaging through the pelvis shows no free intraperitoneal fluid.  No pelvic sidewall lymphadenopathy.  Bladder and uterus are unremarkable.  There is no adnexal mass.  No colonic diverticulitis.  Terminal ileum is normal.  The appendix is normal.  No acute bony abnormality.  IMPRESSION: No acute findings in the abdomen or pelvis.  Tree in bud opacity identified in the visualized portion of the right lower lobe.  This may be related to bronchopneumonia or atypical infection.  Original Report Authenticated By: ERIC A. MANSELL, M.D.   Impression: 1. Healthcare associated pneumonia. She was in the hospital towards the end of December 2012. 2. COPD with likely exacerbation. 3. Diabetes mellitus. 4. Obesity.     Plan: 1.  Admit. 2. Oral steroids and intravenous antibiotics to cover healthcare associated pneumonia. 3. If the patient is stable or improved, I would consider discharge tomorrow. I do not anticipate a prolonged hospitalization.      Wilson Singer Pager [phone]  02/03/2012, 2:36 PM

## 2012-02-03 NOTE — Consult Note (Signed)
ANTIBIOTIC CONSULT NOTE - INITIAL  Pharmacy Consult for Vancomycin, Levaquin, Cefepime Indication: rule out pneumonia  Allergies  Allergen Reactions  . Penicillins Other (See Comments)    convulsions  . Morphine Nausea Only   Patient Measurements: Height: 5\' 1"  (154.9 cm) Weight: 174 lb 9.7 oz (79.2 kg) IBW/kg (Calculated) : 47.8   Vital Signs: Temp: 99 F (37.2 C) (03/13 1546) Temp src: Oral (03/13 1546) BP: 118/77 mmHg (03/13 1546) Pulse Rate: 100  (03/13 1546) Intake/Output from previous day:   Intake/Output from this shift:    Labs:  Basename 02/03/12 1031  WBC 9.9  HGB 12.9  PLT 398  LABCREA --  CREATININE 0.56   Estimated Creatinine Clearance: 86.5 ml/min (by C-G formula based on Cr of 0.56). No results found for this basename: VANCOTROUGH:2,VANCOPEAK:2,VANCORANDOM:2,GENTTROUGH:2,GENTPEAK:2,GENTRANDOM:2,TOBRATROUGH:2,TOBRAPEAK:2,TOBRARND:2,AMIKACINPEAK:2,AMIKACINTROU:2,AMIKACIN:2, in the last 72 hours   Microbiology: No results found for this or any previous visit (from the past 720 hour(s)).  Medical History: Past Medical History  Diagnosis Date  . Migraine headache   . On home O2   . COPD (chronic obstructive pulmonary disease)   . Bipolar disorder   . Chronic back pain   . Hyperglycemia, drug-induced     steroid induced hyperglycemia  . ARDS (adult respiratory distress syndrome)     Jan 2011  . Diabetes mellitus   . Pneumonia    Medications:  Scheduled:    . acetaminophen  650 mg Oral Once  . albuterol  5 mg Nebulization Once  . aztreonam  2 g Intravenous Once  . ceFEPime (MAXIPIME) IV  1 g Intravenous Q12H  . fenofibrate  160 mg Oral Daily  . Fluticasone-Salmeterol  1 puff Inhalation Q12H  . furosemide  20 mg Oral Daily  . glyBURIDE  5 mg Oral BID WC   And  . metFORMIN  500 mg Oral BID WC  . heparin  5,000 Units Subcutaneous Q8H  . HYDROmorphone  0.5 mg Intravenous Once  . HYDROmorphone  0.5 mg Intravenous Once  . insulin aspart  0-20  Units Subcutaneous TID WC  . insulin aspart  0-5 Units Subcutaneous QHS  . ipratropium  0.5 mg Nebulization Once  . levofloxacin (LEVAQUIN) IV  750 mg Intravenous Once  . levofloxacin (LEVAQUIN) IV  750 mg Intravenous Q24H  . methylPREDNISolone sodium succinate  125 mg Intravenous Once  . ondansetron  4 mg Intravenous Once  . ondansetron  4 mg Intravenous Once  . potassium chloride SA  20 mEq Oral BID  . predniSONE  40 mg Oral Q breakfast  . sodium chloride  1,000 mL Intravenous Once  . tiotropium  18 mcg Inhalation Daily  . tiZANidine  4 mg Oral TID  . vancomycin  1,000 mg Intravenous Once  . vancomycin  1,000 mg Intravenous Q12H  . DISCONTD: glyBURIDE-metformin  1 tablet Oral BID WC   Assessment: Obesity Good renal fxn  Goal of Therapy:  Vancomycin trough level 15-20 mcg/ml  Plan: Vancomycin 1gm iv q12hrs Check trough Friday Cefepime 1gm iv q12hrs Levaquin 750mg  iv q24hrs De-escalate ABX when appropriate F/U micro data Labs per protocol  Valrie Hart A 02/03/2012,3:50 PM

## 2012-02-03 NOTE — ED Notes (Signed)
Pt drinking contrast and c/o nausea returning.  Notified edp and another dose of zofran given.

## 2012-02-03 NOTE — ED Notes (Signed)
Pt reports abd pain and no bm x 2 weeks.  Reports has been vomiting x 1 week, cold symptoms x 3 or 4 days and fever for past few days.  Pt on home o2 at 2liters but has increased to 3liters because feels SOB.  Denies chest pain.  Reports cough has been productive with green sputum.

## 2012-02-03 NOTE — ED Provider Notes (Signed)
History   This chart was scribed for Gavin Pound. Oletta Lamas, MD by Clarita Crane. The patient was seen in room APA06/APA06. Patient's care was started at 1008.   CSN: [id number]  Arrival date & time 02/03/12  1008   First MD Initiated Contact with Patient 02/03/12 1025      Chief Complaint  Patient presents with  . Shortness of Breath  . Emesis  . Constipation    (Consider location/radiation/quality/duration/timing/severity/associated sxs/prior treatment) HPI [name] is a 46 y.o. female who presents to the Emergency Department complaining of constant moderate to severe constipation with associated right sided abdominal pain onset 2 weeks ago and worsening since. Patient notes last BM occurred 2 weeks ago and constipation has not been relived with use of laxatives. Also notes she began experiencing fever, nausea and vomiting for the past week. States nausea and vomiting is aggravated with eating and drinking. Reports minimal relief with use of Tylenol for fever. Patient notes she is currently using narcotic pain medication to treat chronic lower back pain. Denies h/o similar symptoms, weight loss, chills, diarrhea. Additionally, patient c/o moderate to severe SOB onset sevral days ago and worsening since with associated nasal congestion, rhinorrhea and productive cough. States current symptoms are similar to that previously experienced with pneumonia. Denies associated chest pain, diaphoresis. Patient with h/o COPD, diabetes, migraine, pneumonia, c-section, tubal ligation and denies h/o diverticulitis. Patient on continuous home O2 and is a current smoker.   PCP- [facility]  Past Medical History  Diagnosis Date  . Migraine headache   . On home O2   . COPD (chronic obstructive pulmonary disease)   . Bipolar disorder   . Chronic back pain   . Hyperglycemia, drug-induced     steroid induced hyperglycemia  . ARDS (adult respiratory distress syndrome)     Jan 2011  . Diabetes mellitus   .  Pneumonia     Past Surgical History  Procedure Date  . Tubal ligation   . Uterine ablasion   . C-section   . Tracheostomy     decannulated 12/2009    History reviewed. No pertinent family history.  History  Substance Use Topics  . Smoking status: Former Smoker -- 1.5 packs/day for 33 years    Quit date: 07/30/2011  . Smokeless tobacco: Not on file   Comment: smoking 1-2 cigarettes every other day  . Alcohol Use: No    OB History    Grav Para Term Preterm Abortions TAB SAB Ect Mult Living                  Review of Systems 10 Systems reviewed and are negative for acute change except as noted in the HPI.  Allergies  Penicillins and Morphine  Home Medications   Current Outpatient Rx  Name Route Sig Dispense Refill  . ACETAMINOPHEN 500 MG PO TABS Oral Take 1,000 mg by mouth every 6 (six) hours as needed. Pain    . ALBUTEROL SULFATE (2.5 MG/3ML) 0.083% IN NEBU Nebulization Take 2.5 mg by nebulization every 4 (four) hours as needed. For shortness of breath     . CHOLINE FENOFIBRATE 135 MG PO CPDR Oral Take 135 mg by mouth daily.      [name] FLUTICASONE-SALMETEROL 250-50 MCG/DOSE IN AEPB Inhalation Inhale 1 puff into the lungs every 12 (twelve) hours.      . FUROSEMIDE 20 MG PO TABS Oral Take 20 mg by mouth daily.      . GLYBURIDE-METFORMIN 5-500 MG PO TABS Oral  Take 1 tablet by mouth 2 (two) times daily with a meal. When sugar is high.    . IBUPROFEN 200 MG PO TABS Oral Take 800 mg by mouth every 6 (six) hours as needed. Pain    . NYSTATIN 100000 UNIT/ML MT SUSP Oral Take 500,000 Units by mouth 4 (four) times daily as needed. Thrust    . OXYCODONE-ACETAMINOPHEN 10-325 MG PO TABS Oral Take 1 tablet by mouth every 4 (four) hours as needed. For pain    . POTASSIUM CHLORIDE CRYS ER 20 MEQ PO TBCR Oral Take 20 mEq by mouth 2 (two) times daily.      [name] TIOTROPIUM BROMIDE MONOHYDRATE 18 MCG IN CAPS Inhalation Place 18 mcg into inhaler and inhale daily.      [name] TIZANIDINE HCL 4 MG PO TABS  Oral Take 4 mg by mouth 3 (three) times daily.     . VENTOLIN HFA 108 (90 BASE) MCG/ACT IN AERS  INHALE 2 PUFFS INTO THE LUNGS EVERY 6 (SIX) HOURS AS NEEDED. 18 g 1    BP 121/76  Pulse 99  Temp(Src) 99.3 F (37.4 C) (Oral)  Resp 20  Ht 5\' 1"  (1.549 m)  Wt 179 lb (81.194 kg)  BMI 33.82 kg/m2  SpO2 96%  Physical Exam  Nursing note and vitals reviewed. Constitutional: She is oriented to person, place, and time. She appears well-developed and well-nourished. No distress.  HENT:  Head: Normocephalic and atraumatic.       Mucous membranes mildly dry.   Eyes: EOM are normal. Pupils are equal, round, and reactive to light.  Neck: Neck supple. No tracheal deviation present.       Well-healed scar c/w previous tracheotomy.  Cardiovascular: Normal rate, regular rhythm and intact distal pulses.  Exam reveals no gallop and no friction rub.   No murmur heard. Pulmonary/Chest: Effort normal. No respiratory distress. She has wheezes (expiratory, bilaterally.). She has no rales.       Coughing during exam.   Abdominal: Soft. She exhibits distension (mild). There is tenderness.       Non-rigid. Bowel sounds decreased but present.   Musculoskeletal: Normal range of motion. She exhibits no edema.  Neurological: She is alert and oriented to person, place, and time. No sensory deficit.  Skin: Skin is warm and dry. No rash noted.  Psychiatric: She has a normal mood and affect. Her behavior is normal.    ED Course  Procedures (including critical care time)  DIAGNOSTIC STUDIES: Oxygen Saturation is 98% on [place]-3L, normal by my interpretation.    COORDINATION OF CARE: 10:52AM- Patient informed of current plan for treatment and evaluation and agrees with plan at this time.  1.21PM - Patient informed of scans and treatment.Possible admission discussed.  2:15PM - Consult with hospitalist on patient status.  Labs Reviewed  HEPATIC FUNCTION PANEL - Abnormal; Notable for the following:    Total  Bilirubin 0.2 (*)    All other components within normal limits  CBC  DIFFERENTIAL  BASIC METABOLIC PANEL  URINALYSIS, ROUTINE W REFLEX MICROSCOPIC  GLUCOSE, CAPILLARY  LIPASE, BLOOD  POCT I-STAT TROPONIN I   Dg Chest 2 View  02/03/2012  *RADIOLOGY REPORT*  Clinical Data: Shortness of breath, emesis, constipation.  CHEST - 2 VIEW  Comparison: Chest x-ray 11/20/2011.  Findings: Lung volumes are normal.  Mild diffuse interstitial prominence is noted, decreased compared to the prior study 11/20/2011.  No consolidative airspace disease.  No pleural effusions.  Heart size is normal.  Mediastinal contours are  unremarkable.  IMPRESSION: 1.  Although there is mild diffuse interstitial prominence, this has significantly improved compared to the prior examination, and what is seen on today's studies favored to be a chronic finding. No definite radiographic evidence of acute cardiopulmonary disease otherwise noted.  Original Report Authenticated By: Florencia Reasons, M.D.   Ct Abdomen Pelvis W Contrast  02/03/2012  *RADIOLOGY REPORT*  Clinical Data: Nausea vomiting.  Shortness of breath.  CT ABDOMEN AND PELVIS WITH CONTRAST  Technique:  Multidetector CT imaging of the abdomen and pelvis was performed following the standard protocol during bolus administration of intravenous contrast.  Contrast: OMNIPAQUE IOHEXOL 300 MG/ML IJ SOLN  Comparison: 10/11/2010  Findings: Subtle tree in bud opacity is seen in the right lower lobe.  No focal abnormalities seen in the liver or spleen.  The stomach, duodenum, pancreas, gallbladder, and adrenal glands are unremarkable.  Kidneys are unremarkable.  No abdominal aortic aneurysm.  There is no free fluid or lymphadenopathy in the abdomen.  Abdominal bowel loops are unremarkable.  Imaging through the pelvis shows no free intraperitoneal fluid.  No pelvic sidewall lymphadenopathy.  Bladder and uterus are unremarkable.  There is no adnexal mass.  No colonic diverticulitis.   Terminal ileum is normal.  The appendix is normal.  No acute bony abnormality.  IMPRESSION: No acute findings in the abdomen or pelvis.  Tree in bud opacity identified in the visualized portion of the right lower lobe.  This may be related to bronchopneumonia or atypical infection.  Original Report Authenticated By: ERIC A. MANSELL, M.D.     No diagnosis found.  EKG at time 1026, shows a normal sinus rhythm at a rate of 90, normal axis, incomplete right bundle branch block, no ST or T-wave abnormalities. EKG is similar in appearance to that from 09/26/2011 except that rate is slightly slower.   1:10 PM Pt's CT of abd suggests she may have a right lower pneumonia that was not clearly evident on CXR.  Pt reports feeling like she may have pneumonia, has had fevers at home.  Givne her prior h/o intubation and severe respiratory compromise in the past, would favor admission for HAP.  Pt was admitted to hospital from 12/28-11/24/11, within 90 days.    2:22 PM Dr. Karilyn Cota to see pt in the ED. MDM   Patient with some diffuse abdominal pain but no significant rebound or guarding on exam. Patient reports that she has been constipated for 2 weeks. She also has had some subjective fevers and nausea and vomiting. This is complicated by her typical symptoms of COPD and dyspnea with associated coughing. Her chest x-ray per the radiologist shows no obvious infiltrate and just some chronic changes. She does have wheezing on exam however and she is treated with nebulizer treatments and steroids. Plan is to obtain an abdominal CT scan given her fever and vomiting constipation as well as abdominal pain. Patient's symptoms otherwise are also controlled by IV fluids, IV antiemetics and IV analgesics for now.      I personally performed the services described in this documentation, which was scribed in my presence. The recorded information has been reviewed and considered.    Gavin Pound. Marget Outten, MD 02/03/12 1422

## 2012-02-03 NOTE — Progress Notes (Signed)
Called to room by patient.  Found patient sitting in chair stating she thought she was having a reaction to Maxipime. Vitals signs stable. Blood glucose was 359. MD notified. Did not agree patient was having reaction to medication. Notified pt. Will continue to monitor.  Schonewitz, Candelaria Stagers 02/03/2012

## 2012-02-03 NOTE — ED Notes (Signed)
Pt given diet coke to drink and meal tray ordered.  Verified with Dr. Oletta Lamas that blood cultures not needed.  Ok to start antibiotics.

## 2012-02-03 NOTE — ED Notes (Signed)
Waiting for hospitalist to see pt in ed.

## 2012-02-03 NOTE — ED Notes (Signed)
Pt reports has been taking miralax and exlax with no results.  Says only passes gas.

## 2012-02-04 LAB — COMPREHENSIVE METABOLIC PANEL
Albumin: 3.4 g/dL — ABNORMAL LOW (ref 3.5–5.2)
Alkaline Phosphatase: 43 U/L (ref 39–117)
BUN: 6 mg/dL (ref 6–23)
Chloride: 102 mEq/L (ref 96–112)
Potassium: 4.3 mEq/L (ref 3.5–5.1)
Total Bilirubin: 0.1 mg/dL — ABNORMAL LOW (ref 0.3–1.2)

## 2012-02-04 LAB — CBC
HCT: 37.6 % (ref 36.0–46.0)
Hemoglobin: 12 g/dL (ref 12.0–15.0)
RBC: 4.55 MIL/uL (ref 3.87–5.11)
RDW: 14.1 % (ref 11.5–15.5)
WBC: 12.7 10*3/uL — ABNORMAL HIGH (ref 4.0–10.5)

## 2012-02-04 LAB — EXPECTORATED SPUTUM ASSESSMENT W GRAM STAIN, RFLX TO RESP C

## 2012-02-04 LAB — GLUCOSE, CAPILLARY
Glucose-Capillary: 165 mg/dL — ABNORMAL HIGH (ref 70–99)
Glucose-Capillary: 83 mg/dL (ref 70–99)

## 2012-02-04 LAB — LEGIONELLA ANTIGEN, URINE

## 2012-02-04 MED ORDER — POLYETHYLENE GLYCOL 3350 17 G PO PACK
17.0000 g | PACK | Freq: Every day | ORAL | Status: DC
  Administered 2012-02-04 – 2012-02-08 (×5): 17 g via ORAL
  Filled 2012-02-04 (×5): qty 1

## 2012-02-04 MED ORDER — SODIUM CHLORIDE 0.9 % IJ SOLN
3.0000 mL | Freq: Two times a day (BID) | INTRAMUSCULAR | Status: DC
  Administered 2012-02-04 – 2012-02-07 (×5): 3 mL via INTRAVENOUS
  Filled 2012-02-04 (×4): qty 3

## 2012-02-04 MED ORDER — METFORMIN HCL 500 MG PO TABS
500.0000 mg | ORAL_TABLET | Freq: Two times a day (BID) | ORAL | Status: DC
  Administered 2012-02-06 – 2012-02-08 (×5): 500 mg via ORAL
  Filled 2012-02-04 (×5): qty 1

## 2012-02-04 MED ORDER — SODIUM CHLORIDE 0.9 % IV SOLN
250.0000 mL | INTRAVENOUS | Status: DC | PRN
  Administered 2012-02-04: 250 mL via INTRAVENOUS

## 2012-02-04 MED ORDER — SODIUM CHLORIDE 0.9 % IJ SOLN
3.0000 mL | INTRAMUSCULAR | Status: DC | PRN
  Filled 2012-02-04: qty 3

## 2012-02-04 MED ORDER — SODIUM CHLORIDE 0.9 % IJ SOLN
INTRAMUSCULAR | Status: AC
  Administered 2012-02-04: 10 mL
  Filled 2012-02-04: qty 3

## 2012-02-04 NOTE — Progress Notes (Signed)
NAME:  [name], [name]                 ACCOUNT NO.:  [id number]  MEDICAL RECORD NO.:  [id number]  LOCATION:  A338                          FACILITY:  [facility]  PHYSICIAN:  Essynce Munsch D. [name], MD   DATE OF BIRTH:  03/03/1966  DATE OF PROCEDURE:  02/04/2012 DATE OF DISCHARGE:                                PROGRESS NOTE   The patient was admitted last night due to cough and shortness of breath.  She has exacerbation of COPD with pneumonia.  The patient is started on combination of IV antibiotics.  She is complaining of constipation.  No fever or chills.  Her breathing is slightly better.  OBJECTIVE:  GENERAL:  The patient is alert, awake, sick looking. VITAL SIGNS:  Blood pressure 135/86, pulse 102, respiratory rate 22, temperature 98 degrees Fahrenheit. CHEST:  Decreased air entry, few rhonchi. CARDIOVASCULAR:  First and second heart sound heard.  No murmur.  No gallop. ABDOMEN:  Soft and lax.  Bowel sound is positive.  No masses or organomegaly. EXTREMITIES:  No leg edema.  LABORATORY DATA:  CBC:  WBC 12.7, hemoglobin 12.0, hematocrit 37.6, and platelet 404.  BMP:  Sodium 136, potassium 4.3, chloride 102, carbon dioxide 29, glucose 159, BUN 6, creatinine 0.5, and calcium 9.4.  ASSESSMENT: 1. Acute exacerbation of chronic obstructive pulmonary disease. 2. Healthcare-associated pneumonia. 3. Diabetes mellitus. 4. Mild obesity.  PLAN:  Continue the patient on IV antibiotics.  Continue IV steroid.  We will do Pulmonary consult and I will continue her on regular medications.     Airlie Blumenberg D. [name], MD     TDF/MEDQ  D:  02/04/2012  T:  02/04/2012  Job:  [id number]

## 2012-02-04 NOTE — Consult Note (Signed)
ANTIBIOTIC CONSULT NOTE   Pharmacy Consult for Vancomycin, Levaquin, Cefepime Indication: rule out pneumonia  Allergies  Allergen Reactions  . Penicillins Other (See Comments)    convulsions  . Morphine Nausea Only   Patient Measurements: Height: 5\' 1"  (154.9 cm) Weight: 174 lb 9.7 oz (79.2 kg) IBW/kg (Calculated) : 47.8   Vital Signs: Temp: 98 F (36.7 C) (03/14 0427) Temp src: Oral (03/14 0427) BP: 113/71 mmHg (03/14 0427) Pulse Rate: 78  (03/14 0427) Intake/Output from previous day: 03/13 0701 - 03/14 0700 In: -  Out: 1350 [Urine:1350] Intake/Output from this shift: Total I/O In: 320 [P.O.:320] Out: 600 [Urine:600]  Labs:  [facility] 02/04/12 0512 02/03/12 1031  WBC 12.7* 9.9  HGB 12.0 12.9  PLT 404* 398  LABCREA -- --  CREATININE 0.53 0.56   Estimated Creatinine Clearance: 86.5 ml/min (by C-G formula based on Cr of 0.53). No results found for this basename: VANCOTROUGH:2,VANCOPEAK:2,VANCORANDOM:2,GENTTROUGH:2,GENTPEAK:2,GENTRANDOM:2,TOBRATROUGH:2,TOBRAPEAK:2,TOBRARND:2,AMIKACINPEAK:2,AMIKACINTROU:2,AMIKACIN:2, in the last 72 hours   Microbiology: Recent Results (from the past 720 hour(s))  CULTURE, BLOOD (ROUTINE X 2)     Status: Normal (Preliminary result)   Collection Time   02/03/12  4:05 PM      Component Value Range Status Comment   Specimen Description BLOOD LEFT ANTECUBITAL   Final    Special Requests     Final    Value: BOTTLES DRAWN AEROBIC AND ANAEROBIC 6CC  IMMUNE:COMPRM   Culture NO GROWTH 1 DAY   Final    Report Status PENDING   Incomplete   CULTURE, BLOOD (ROUTINE X 2)     Status: Normal (Preliminary result)   Collection Time   02/03/12  4:05 PM      Component Value Range Status Comment   Specimen Description BLOOD RIGHT HAND   Final    Special Requests     Final    Value: BOTTLES DRAWN AEROBIC AND ANAEROBIC 5CC  IMMUNE:COMPRM   Culture NO GROWTH 1 DAY   Final    Report Status PENDING   Incomplete   MRSA PCR SCREENING     Status: Normal   Collection Time   02/03/12  4:35 PM      Component Value Range Status Comment   MRSA by PCR NEGATIVE  NEGATIVE  Final     Medical History: Past Medical History  Diagnosis Date  . Migraine headache   . On home O2   . COPD (chronic obstructive pulmonary disease)   . Bipolar disorder   . Chronic back pain   . Hyperglycemia, drug-induced     steroid induced hyperglycemia  . ARDS (adult respiratory distress syndrome)     Jan 2011  . Diabetes mellitus   . Pneumonia   . Angina   . Asthma   . Shortness of breath   . Recurrent upper respiratory infection (URI)    Medications:  Scheduled:     . acetaminophen  650 mg Oral Once  . albuterol  5 mg Nebulization Once  . antiseptic oral rinse  15 mL Mouth Rinse BID  . aztreonam  2 g Intravenous Once  . ceFEPime (MAXIPIME) IV  1 g Intravenous Q12H  . fenofibrate  160 mg Oral Daily  . Fluticasone-Salmeterol  1 puff Inhalation Q12H  . furosemide  20 mg Oral Daily  . glyBURIDE  5 mg Oral BID WC  . heparin  5,000 Units Subcutaneous Q8H  . HYDROmorphone  0.5 mg Intravenous Once  . HYDROmorphone  0.5 mg Intravenous Once  . insulin aspart  0-20 Units  Subcutaneous TID WC  . insulin aspart  0-5 Units Subcutaneous QHS  . ipratropium  0.5 mg Nebulization Once  . levofloxacin (LEVAQUIN) IV  750 mg Intravenous Once  . levofloxacin (LEVAQUIN) IV  750 mg Intravenous Q24H  . metFORMIN  500 mg Oral BID WC  . methylPREDNISolone sodium succinate  125 mg Intravenous Once  . ondansetron  4 mg Intravenous Once  . ondansetron  4 mg Intravenous Once  . polyethylene glycol  17 g Oral Daily  . potassium chloride SA  20 mEq Oral BID  . predniSONE  40 mg Oral Q breakfast  . sodium chloride  1,000 mL Intravenous Once  . sodium chloride      . sodium chloride      . tiotropium  18 mcg Inhalation Daily  . tiZANidine  4 mg Oral TID  . vancomycin  1,000 mg Intravenous Q12H  . DISCONTD: glyBURIDE-metformin  1 tablet Oral BID WC  . DISCONTD: metFORMIN  500 mg  Oral BID WC  . DISCONTD: vancomycin  1,000 mg Intravenous Once  . DISCONTD: vancomycin  1,000 mg Intravenous Q12H   Assessment: Obesity Good renal fxn, renal fxn stable  Goal of Therapy:  Vancomycin trough level 15-20 mcg/ml  Plan: Vancomycin 1gm iv q12hrs Check trough Friday Cefepime 1gm iv q12hrs Levaquin 750mg  iv q24hrs De-escalate ABX when appropriate F/U micro data Labs per protocol  Valrie Hart A 02/04/2012,10:22 AM

## 2012-02-04 NOTE — Consult Note (Signed)
NAME:  [name], [name]                 ACCOUNT NO.:  [id number]  MEDICAL RECORD NO.:  [id number]  LOCATION:  A338                          FACILITY:  [facility]  PHYSICIAN:  Kayla Deshaies L. Juanetta Gosling, M.D.DATE OF BIRTH:  1966-10-04  DATE OF CONSULTATION: DATE OF DISCHARGE:                                CONSULTATION   Patient of Dr. [name].  REASON FOR CONSULTATION:  Pneumonia.  HISTORY:  Ms. Madera is a 46 year old who has a long known history of COPD.  She has been on oxygen at home for the last several years.  She had an episode of pneumonia, complicated by ARDS, long-term ventilation tracheostomy about 2 years ago.  She came to the emergency room because of cough and congestion and abdominal pain and underwent a CT scan, which showed  possible right lower lobe pneumonia.  She has history of diabetes, bipolar disease, migraine headaches, COPD, chronic low back pain, and had the episode of ARDS that was associated with pneumonia. She says she has been having problems with her lungs since she was born. She is unaware if she has been tested for alpha 1 antitrypsin deficiency.  Surgically, she has had a tubal ligation, uterine ablation, tracheostomy.  SOCIAL HISTORY:  She lives with a boyfriend.  She smokes about 6 or 7 cigarettes daily and has about a 30 pack-year smoking history.  FAMILY HISTORY:  Positive for COPD in multiple family members.  REVIEW OF SYSTEMS:  Positive for constipation, nausea, and vomiting.  Her medications are as listed in the electronic medical record.  PHYSICAL EXAMINATION:  GENERAL:  Shows a well-developed, well-nourished female who appears to be in no acute distress now. HEENT:  Her pupils are reactive.  Nose and throat are clear.  Mucous membranes are moist. NECK:  Supple without masses.  She does have a tracheostomy scar. CHEST:  Shows decreased breath sounds and some rhonchi. HEART:  Regular without gallop. ABDOMEN:  Soft.  Bowel sounds present and  active. EXTREMITIES:  Showed no edema. CENTRAL NERVOUS SYSTEM:  Grossly intact.  Her white blood count on admission was 9900, hemoglobin 12.9, platelets 389, creatinine of 0.56.  CT as mentioned showed what looks like a pneumonia.  My assessment is that she does have what appears to be a healthcare- associated pneumonia and plan is for her to continue on her current antibiotics.  She is already improved.  Continue oxygen nebulizer treatment, etc.     Cintia Gleed L. Juanetta Gosling, M.D.     ELH/MEDQ  D:  02/04/2012  T:  02/04/2012  Job:  [id number]

## 2012-02-05 ENCOUNTER — Encounter (HOSPITAL_COMMUNITY): Payer: Self-pay | Admitting: Radiology

## 2012-02-05 ENCOUNTER — Encounter (HOSPITAL_COMMUNITY): Payer: Medicaid Other

## 2012-02-05 ENCOUNTER — Inpatient Hospital Stay (HOSPITAL_COMMUNITY): Payer: Medicaid Other

## 2012-02-05 LAB — LEGIONELLA ANTIGEN, URINE: Legionella Antigen, Urine: NEGATIVE

## 2012-02-05 LAB — GLUCOSE, CAPILLARY
Glucose-Capillary: 121 mg/dL — ABNORMAL HIGH (ref 70–99)
Glucose-Capillary: 212 mg/dL — ABNORMAL HIGH (ref 70–99)

## 2012-02-05 LAB — BASIC METABOLIC PANEL
BUN: 10 mg/dL (ref 6–23)
CO2: 30 mEq/L (ref 19–32)
Chloride: 101 mEq/L (ref 96–112)
Creatinine, Ser: 0.58 mg/dL (ref 0.50–1.10)
Glucose, Bld: 78 mg/dL (ref 70–99)

## 2012-02-05 MED ORDER — HYDROMORPHONE HCL PF 1 MG/ML IJ SOLN
0.5000 mg | INTRAMUSCULAR | Status: DC | PRN
  Administered 2012-02-05: 1 mg via INTRAVENOUS
  Administered 2012-02-05: 0.5 mg via INTRAVENOUS
  Administered 2012-02-06 – 2012-02-08 (×14): 1 mg via INTRAVENOUS
  Filled 2012-02-05 (×16): qty 1

## 2012-02-05 MED ORDER — MAGNESIUM HYDROXIDE 400 MG/5ML PO SUSP
30.0000 mL | Freq: Every day | ORAL | Status: DC | PRN
  Administered 2012-02-05 – 2012-02-06 (×2): 30 mL via ORAL
  Filled 2012-02-05 (×2): qty 30

## 2012-02-05 MED ORDER — SODIUM CHLORIDE 0.9 % IJ SOLN
INTRAMUSCULAR | Status: AC
  Filled 2012-02-05: qty 3

## 2012-02-05 MED ORDER — VANCOMYCIN HCL IN DEXTROSE 1-5 GM/200ML-% IV SOLN
1000.0000 mg | Freq: Three times a day (TID) | INTRAVENOUS | Status: DC
  Administered 2012-02-05 – 2012-02-07 (×7): 1000 mg via INTRAVENOUS
  Filled 2012-02-05 (×10): qty 200

## 2012-02-05 NOTE — Progress Notes (Signed)
[id number] 

## 2012-02-05 NOTE — Progress Notes (Signed)
NAME:  [name]                 ACCOUNT NO.:  [id number]  MEDICAL RECORD NO.:  [id number]  LOCATION:  A338                          FACILITY:  [facility]  PHYSICIAN:  Eilidh Marcano L. Juanetta Gosling, M.D.DATE OF BIRTH:  1966/01/30  DATE OF PROCEDURE: DATE OF DISCHARGE:                                PROGRESS NOTE   Patient of Dr. Letitia Neri.  Ms. [name] was admitted with COPD exacerbation and possible pneumonia. 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  She says this morning she is feeling a little bit better, but still congested and coughing.  She has chronic low back pain and has difficulty with lying down on the bed.  She is not coughing much of anything up.  She has poor IV access and arrangements are being made for a PICC line.  PHYSICAL EXAMINATION:  GENERAL:  Shows that she is awake and alert. CHEST:  She has bilateral end expiratory wheezes and some rhonchi. HEART:  Regular. ABDOMEN:  Soft.  Assessment is that she has chronic obstructive pulmonary disease exacerbation with probable pneumonia and is improving, but slowly.  She has difficulty with IV access, so a PICC line is going to be placed. She will need to have a alpha 1 antitrypsin level, but this should be done as an outpatient because alpha 1 antitrypsin is an acute phase reactant.     Don Giarrusso L. Juanetta Gosling, M.D.     ELH/MEDQ  D:  02/05/2012  T:  02/05/2012  Job:  [id number]

## 2012-02-05 NOTE — Plan of Care (Signed)
Problem: Phase III Progression Outcomes Goal: Pain controlled on oral analgesia Outcome: Not Progressing Pt required new IV pain medication.  Goal: Tolerating diet Outcome: Progressing No complaints at this time with diet.

## 2012-02-05 NOTE — Progress Notes (Addendum)
Notified Dr. [name] about PICC line placement due to poor IV access on multiple antibiotics. Also notified him about patients continued complaint about constipation without relief from Miralax. New orders given and followed. Notified PICC team.

## 2012-02-05 NOTE — Progress Notes (Signed)
ANTIBIOTIC CONSULT NOTE   Pharmacy Consult for Vancomycin, Levaquin, Cefepime Indication: rule out pneumonia (HCAP)  Allergies  Allergen Reactions  . Penicillins Other (See Comments)    convulsions  . Morphine Nausea Only   Patient Measurements: Height: 5\' 1"  (154.9 cm) Weight: 174 lb 9.7 oz (79.2 kg) IBW/kg (Calculated) : 47.8   Vital Signs: Temp: 97.9 F (36.6 C) (03/15 0443) Temp src: Oral (03/15 0443) BP: 119/79 mmHg (03/15 0443) Pulse Rate: 72  (03/15 0443) Intake/Output from previous day: 03/14 0701 - 03/15 0700 In: 1173 [P.O.:1160; I.V.:13] Out: 1000 [Urine:1000] Intake/Output from this shift: Total I/O In: 360 [P.O.:360] Out: -   Labs:  Basename 02/05/12 0502 02/04/12 0512 02/03/12 1031  WBC -- 12.7* 9.9  HGB -- 12.0 12.9  PLT -- 404* 398  LABCREA -- -- --  CREATININE 0.58 0.53 0.56   Estimated Creatinine Clearance: 86.5 ml/min (by C-G formula based on Cr of 0.58).  Basename 02/05/12 0502  VANCOTROUGH 5.0*  VANCOPEAK --  Drue Dun --  GENTTROUGH --  GENTPEAK --  GENTRANDOM --  TOBRATROUGH --  TOBRAPEAK --  TOBRARND --  AMIKACINPEAK --  AMIKACINTROU --  AMIKACIN --     Microbiology: Recent Results (from the past 720 hour(s))  CULTURE, BLOOD (ROUTINE X 2)     Status: Normal (Preliminary result)   Collection Time   02/03/12  4:05 PM      Component Value Range Status Comment   Specimen Description BLOOD LEFT ANTECUBITAL   Final    Special Requests     Final    Value: BOTTLES DRAWN AEROBIC AND ANAEROBIC 6CC  IMMUNE:COMPRM   Culture NO GROWTH 2 DAYS   Final    Report Status PENDING   Incomplete   CULTURE, BLOOD (ROUTINE X 2)     Status: Normal (Preliminary result)   Collection Time   02/03/12  4:05 PM      Component Value Range Status Comment   Specimen Description BLOOD RIGHT HAND   Final    Special Requests     Final    Value: BOTTLES DRAWN AEROBIC AND ANAEROBIC 5CC  IMMUNE:COMPRM   Culture NO GROWTH 2 DAYS   Final    Report Status  PENDING   Incomplete   MRSA PCR SCREENING     Status: Normal   Collection Time   02/03/12  4:35 PM      Component Value Range Status Comment   MRSA by PCR NEGATIVE  NEGATIVE  Final   CULTURE, SPUTUM-ASSESSMENT     Status: Normal   Collection Time   02/04/12  4:30 AM      Component Value Range Status Comment   Specimen Description SPUTUM EXPECTORATED   Final    Special Requests IMMUNE:COMPRM   Final    Sputum evaluation     Final    Value: THIS SPECIMEN IS ACCEPTABLE. RESPIRATORY CULTURE REPORT TO FOLLOW.     Performed at [facility]   Report Status 02/04/2012 FINAL   Final   CULTURE, RESPIRATORY     Status: Normal (Preliminary result)   Collection Time   02/04/12  4:30 AM      Component Value Range Status Comment   Specimen Description SPUTUM EXPECTORATED   Final    Special Requests IMMUNE:COMPRM   Final    Gram Stain     Final    Value: ABUNDANT WBC PRESENT, PREDOMINANTLY PMN     FEW SQUAMOUS EPITHELIAL CELLS PRESENT     FEW GRAM POSITIVE  COCCI     IN PAIRS FEW YEAST   Culture Culture reincubated for better growth   Final    Report Status PENDING   Incomplete     Medical History: Past Medical History  Diagnosis Date  . Migraine headache   . On home O2   . COPD (chronic obstructive pulmonary disease)   . Bipolar disorder   . Chronic back pain   . Hyperglycemia, drug-induced     steroid induced hyperglycemia  . ARDS (adult respiratory distress syndrome)     Jan 2011  . Diabetes mellitus   . Pneumonia   . Angina   . Asthma   . Shortness of breath   . Recurrent upper respiratory infection (URI)    Medications:  Scheduled:     . antiseptic oral rinse  15 mL Mouth Rinse BID  . ceFEPime (MAXIPIME) IV  1 g Intravenous Q12H  . fenofibrate  160 mg Oral Daily  . Fluticasone-Salmeterol  1 puff Inhalation Q12H  . furosemide  20 mg Oral Daily  . glyBURIDE  5 mg Oral BID WC  . heparin  5,000 Units Subcutaneous Q8H  . insulin aspart  0-20 Units Subcutaneous TID WC    . insulin aspart  0-5 Units Subcutaneous QHS  . levofloxacin (LEVAQUIN) IV  750 mg Intravenous Q24H  . metFORMIN  500 mg Oral BID WC  . polyethylene glycol  17 g Oral Daily  . potassium chloride SA  20 mEq Oral BID  . predniSONE  40 mg Oral Q breakfast  . sodium chloride  3 mL Intravenous Q12H  . sodium chloride      . tiotropium  18 mcg Inhalation Daily  . tiZANidine  4 mg Oral TID  . vancomycin  1,000 mg Intravenous Q12H   Assessment: Obesity Good renal fxn, renal fxn stable  Goal of Therapy:  Vancomycin trough level 15-20 mcg/ml  Plan: Increase Vancomycin 1gm iv q8hrs Repeat trough Sunday if continues. Cefepime 1gm iv q12hrs Levaquin 750mg  iv q24hrs De-escalate ABX when appropriate F/U micro data Labs per protocol.  [name] 02/05/2012,11:24 AM

## 2012-02-05 NOTE — Progress Notes (Signed)
NAME:  [name], [name]                 ACCOUNT NO.:  [id number]  MEDICAL RECORD NO.:  [id number]  LOCATION:  A338                          FACILITY:  [facility]  PHYSICIAN:  Ahmari Duerson D. [name], MD   DATE OF BIRTH:  1966/05/03  DATE OF PROCEDURE:  02/05/2012 DATE OF DISCHARGE:                                PROGRESS NOTE   SUBJECTIVE:  The patient feels better.  Her cough and wheezing is improving.  No fever or chills.  OBJECTIVE:  GENERAL:  The patient is alert, awake, and sick looking. VITAL SIGNS:  Blood pressure 114/74, pulse 72, respiratory rate 17, temperature 97.4 degrees Fahrenheit. CHEST:  Decreased air entry bilaterally, expiratory wheezes and rhonchi. CARDIOVASCULAR:  First and second heart sounds heard.  No murmur.  No gallop. ABDOMEN:  Soft and lax.  Bowel sound is positive.  No mass or organomegaly. EXTREMITIES:  No leg edema.  ASSESSMENT: 1. Chronic obstructive pulmonary disease with exacerbation. 2. Pneumonia.  PLAN:  We will continue the patient on IV antibiotics.  Continue her regular medications and supportive care.     Saman Umstead D. [name], MD     TDF/MEDQ  D:  02/05/2012  T:  02/05/2012  Job:  [id number]

## 2012-02-05 NOTE — Progress Notes (Signed)
   CARE MANAGEMENT NOTE 02/05/2012  Patient:  [name], [name]   Account Number:  [id number]  Date Initiated:  02/05/2012  Documentation initiated by:  Sharrie Rothman  Subjective/Objective Assessment:   Pt admitted from home. Lives with family. Has oxygen from [employer] and nebulizier from [place].     Action/Plan:   CM spoke with pt. No other CM needs noted at this time.   Anticipated DC Date:  02/06/2012   Anticipated DC Plan:  HOME/SELF CARE      DC Planning Services  CM consult      Choice offered to / List presented to:             Status of service:  Completed, signed off Medicare Important Message given?   (If response is "NO", the following Medicare IM given date fields will be blank) Date Medicare IM given:   Date Additional Medicare IM given:    Discharge Disposition:  HOME/SELF CARE  Per UR Regulation:    If discussed at Long Length of Stay Meetings, dates discussed:    Comments:  02/05/12 1517 Arlyss Queen, RN BSN Care Management [phone] Pt from home, lives with family. Has DME in place. NO other CM needs noted at this time.

## 2012-02-06 ENCOUNTER — Inpatient Hospital Stay (HOSPITAL_COMMUNITY): Payer: Medicaid Other

## 2012-02-06 LAB — GLUCOSE, CAPILLARY
Glucose-Capillary: 144 mg/dL — ABNORMAL HIGH (ref 70–99)
Glucose-Capillary: 186 mg/dL — ABNORMAL HIGH (ref 70–99)

## 2012-02-06 MED ORDER — SALINE SPRAY 0.65 % NA SOLN
1.0000 | NASAL | Status: DC | PRN
  Administered 2012-02-06: 1 via NASAL
  Filled 2012-02-06: qty 44

## 2012-02-06 NOTE — Plan of Care (Signed)
Problem: Phase II Progression Outcomes Goal: Progress activity as tolerated unless otherwise ordered Outcome: Completed/Met Date Met:  02/06/12 Pt is ambulating in the hall greater that 200 feet. Goal: Tolerating diet Outcome: Completed/Met Date Met:  02/06/12 Only received nausea medication x1 and according to patient related to Milk of magnesia.

## 2012-02-06 NOTE — Progress Notes (Signed)
Subjective: She says she's feeling fairly well. She's coughing up some sputum. She's complaining that her sinuses are congested and she's having significant left knee pain  Objective: Vital signs in last 24 hours: Temp:  [97.9 F (36.6 C)-98.1 F (36.7 C)] 98.1 F (36.7 C) (03/16 0628) Pulse Rate:  [70-85] 70  (03/16 0628) Resp:  [20] 20  (03/16 0628) BP: (106-121)/(73-82) 106/73 mmHg (03/16 0628) SpO2:  [95 %-99 %] 98 % (03/16 0813) Weight change:  Last BM Date: 02/05/12  Intake/Output from previous day: 03/15 0701 - 03/16 0700 In: 1290 [P.O.:840; IV Piggyback:450] Out: 950 [Urine:950]  PHYSICAL EXAM General appearance: alert, cooperative and mild distress Resp: rhonchi bilaterally Cardio: regular rate and rhythm, S1, S2 normal, no murmur, click, rub or gallop GI: soft, non-tender; bowel sounds normal; no masses,  no organomegaly Extremities: No obvious abnormality of her left knee on exam  Lab Results:    Basic Metabolic Panel:  Basename 02/05/12 0502 02/04/12 0512  NA 137 136  K 4.0 4.3  CL 101 102  CO2 30 29  GLUCOSE 78 159*  BUN 10 6  CREATININE 0.58 0.53  CALCIUM 9.1 9.4  MG -- --  PHOS -- --   Liver Function Tests:  [facility] 02/04/12 0512 02/03/12 1031  AST 11 18  ALT 12 16  ALKPHOS 43 46  BILITOT 0.1* 0.2*  PROT 6.8 7.2  ALBUMIN 3.4* 3.7    Basename 02/03/12 1031  LIPASE 23  AMYLASE --   No results found for this basename: AMMONIA:2 in the last 72 hours CBC:  Basename 02/04/12 0512 02/03/12 1031  WBC 12.7* 9.9  NEUTROABS -- 6.1  HGB 12.0 12.9  HCT 37.6 39.7  MCV 82.6 82.9  PLT 404* 398   Cardiac Enzymes: No results found for this basename: CKTOTAL:3,CKMB:3,CKMBINDEX:3,TROPONINI:3 in the last 72 hours BNP: No results found for this basename: PROBNP:3 in the last 72 hours D-Dimer: No results found for this basename: DDIMER:2 in the last 72 hours CBG:  Basename 02/06/12 0721 02/05/12 2106 02/05/12 1554 02/05/12 1126 02/05/12 0737  02/04/12 2019  GLUCAP 144* 147* 212* 121* 221* 190*   Hemoglobin A1C: No results found for this basename: HGBA1C in the last 72 hours Fasting Lipid Panel: No results found for this basename: CHOL,HDL,LDLCALC,TRIG,CHOLHDL,LDLDIRECT in the last 72 hours Thyroid Function Tests: No results found for this basename: TSH,T4TOTAL,FREET4,T3FREE,THYROIDAB in the last 72 hours Anemia Panel: No results found for this basename: VITAMINB12,FOLATE,FERRITIN,TIBC,IRON,RETICCTPCT in the last 72 hours Coagulation: No results found for this basename: LABPROT:2,INR:2 in the last 72 hours Urine Drug Screen: Drugs of Abuse     Component Value Date/Time   LABOPIA  Value: POSITIVE (NOTE) Sent for confirmatory testing Result repeated and verified.* 02/26/2011 2234   LABOPIA POSITIVE* 05/01/2010 1819   COCAINSCRNUR NEGATIVE 02/26/2011 2234   COCAINSCRNUR NONE DETECTED 05/01/2010 1819   LABBENZ  Value: POSITIVE QUANTITY NOT SUFFICIENT, UNABLE TO PERFORM TEST Unable to perform confirmation testing per [facility] (NOTE) Sent for confirmatory testing Result repeated and verified.* 02/26/2011 2234   LABBENZ POSITIVE* 05/01/2010 1819   AMPHETMU NEGATIVE 02/26/2011 2234   AMPHETMU NONE DETECTED 05/01/2010 1819   THCU POSITIVE* 05/01/2010 1819   LABBARB  Value: NONE DETECTED        DRUG SCREEN FOR MEDICAL PURPOSES ONLY.  IF CONFIRMATION IS NEEDED FOR ANY PURPOSE, NOTIFY LAB WITHIN 5 DAYS.        LOWEST DETECTABLE LIMITS FOR URINE DRUG SCREEN Drug Class  Cutoff (ng/mL) Amphetamine      1000 Barbiturate      200 Benzodiazepine   200 Tricyclics       300 Opiates          300 Cocaine          300 THC              50 05/01/2010 1819    Alcohol Level: No results found for this basename: ETH:2 in the last 72 hours Urinalysis:  Basename 02/03/12 1214  COLORURINE YELLOW  LABSPEC 1.010  PHURINE 6.5  GLUCOSEU NEGATIVE  HGBUR NEGATIVE  BILIRUBINUR NEGATIVE  KETONESUR NEGATIVE  PROTEINUR NEGATIVE  UROBILINOGEN 0.2  NITRITE  NEGATIVE  LEUKOCYTESUR NEGATIVE   Misc. Labs:  ABGS No results found for this basename: PHART,PCO2,PO2ART,TCO2,HCO3 in the last 72 hours CULTURES Recent Results (from the past 240 hour(s))  CULTURE, BLOOD (ROUTINE X 2)     Status: Normal (Preliminary result)   Collection Time   02/03/12  4:05 PM      Component Value Range Status Comment   Specimen Description BLOOD LEFT ANTECUBITAL   Final    Special Requests     Final    Value: BOTTLES DRAWN AEROBIC AND ANAEROBIC 6CC  IMMUNE:COMPRM   Culture NO GROWTH 2 DAYS   Final    Report Status PENDING   Incomplete   CULTURE, BLOOD (ROUTINE X 2)     Status: Normal (Preliminary result)   Collection Time   02/03/12  4:05 PM      Component Value Range Status Comment   Specimen Description BLOOD RIGHT HAND   Final    Special Requests     Final    Value: BOTTLES DRAWN AEROBIC AND ANAEROBIC 5CC  IMMUNE:COMPRM   Culture NO GROWTH 2 DAYS   Final    Report Status PENDING   Incomplete   MRSA PCR SCREENING     Status: Normal   Collection Time   02/03/12  4:35 PM      Component Value Range Status Comment   MRSA by PCR NEGATIVE  NEGATIVE  Final   CULTURE, SPUTUM-ASSESSMENT     Status: Normal   Collection Time   02/04/12  4:30 AM      Component Value Range Status Comment   Specimen Description SPUTUM EXPECTORATED   Final    Special Requests IMMUNE:COMPRM   Final    Sputum evaluation     Final    Value: THIS SPECIMEN IS ACCEPTABLE. RESPIRATORY CULTURE REPORT TO FOLLOW.     Performed at [facility]   Report Status 02/04/2012 FINAL   Final   CULTURE, RESPIRATORY     Status: Normal (Preliminary result)   Collection Time   02/04/12  4:30 AM      Component Value Range Status Comment   Specimen Description SPUTUM EXPECTORATED   Final    Special Requests IMMUNE:COMPRM   Final    Gram Stain     Final    Value: ABUNDANT WBC PRESENT, PREDOMINANTLY PMN     FEW SQUAMOUS EPITHELIAL CELLS PRESENT     FEW GRAM POSITIVE COCCI     IN PAIRS FEW YEAST    Culture Culture reincubated for better growth   Final    Report Status PENDING   Incomplete    Studies/Results: Dg Chest Port 1 View  02/05/2012  *RADIOLOGY REPORT*  Clinical Data: PICC line placement.  PORTABLE CHEST - 1 VIEW  Comparison: 02/03/2012.  Findings: The cardiac silhouette, mediastinal  and hilar contours are within normal limits and stable.  The lungs show improved aeration.  The right PICC line tip it is in the distal SVC near the cavoatrial junction.  No complicating features.  IMPRESSION: Right sided PICC line in good position with the tip in the distal SVC.  Original Report Authenticated By: P. Loralie Champagne, M.D.    Medications:  Scheduled:   . antiseptic oral rinse  15 mL Mouth Rinse BID  . ceFEPime (MAXIPIME) IV  1 g Intravenous Q12H  . fenofibrate  160 mg Oral Daily  . Fluticasone-Salmeterol  1 puff Inhalation Q12H  . furosemide  20 mg Oral Daily  . glyBURIDE  5 mg Oral BID WC  . heparin  5,000 Units Subcutaneous Q8H  . insulin aspart  0-20 Units Subcutaneous TID WC  . insulin aspart  0-5 Units Subcutaneous QHS  . levofloxacin (LEVAQUIN) IV  750 mg Intravenous Q24H  . metFORMIN  500 mg Oral BID WC  . polyethylene glycol  17 g Oral Daily  . potassium chloride SA  20 mEq Oral BID  . predniSONE  40 mg Oral Q breakfast  . sodium chloride  3 mL Intravenous Q12H  . sodium chloride      . tiotropium  18 mcg Inhalation Daily  . tiZANidine  4 mg Oral TID  . vancomycin  1,000 mg Intravenous Q8H  . DISCONTD: vancomycin  1,000 mg Intravenous Q12H   Continuous:  JYN:WGNFAO chloride, acetaminophen, albuterol, albuterol, HYDROmorphone (DILAUDID) injection, ibuprofen, magnesium hydroxide, ondansetron (ZOFRAN) IV, oxyCODONE-acetaminophen, sodium chloride, sodium chloride  Assesment: She has healthcare associated pneumonia and severe COPD. She has diabetes which apparently is pretty well controlled. She has complaints of knee pain. She also says her sinuses are  congested Principal Problem:  *Healthcare-associated pneumonia Active Problems:  COPD  DM (diabetes mellitus)    Plan: She will start saline nasal spray, she will have an x-ray made of her knee and will continue with her antibiotic treatment.    LOS: 3 days   Cristiana Yochim L 02/06/2012, 9:39 AM

## 2012-02-07 LAB — GLUCOSE, CAPILLARY
Glucose-Capillary: 140 mg/dL — ABNORMAL HIGH (ref 70–99)
Glucose-Capillary: 100 mg/dL — ABNORMAL HIGH (ref 70–99)

## 2012-02-07 LAB — CULTURE, RESPIRATORY W GRAM STAIN

## 2012-02-07 LAB — VANCOMYCIN, TROUGH: Vancomycin Tr: 9.9 ug/mL — ABNORMAL LOW (ref 10.0–20.0)

## 2012-02-07 MED ORDER — SODIUM CHLORIDE 0.9 % IJ SOLN
INTRAMUSCULAR | Status: AC
  Administered 2012-02-07: 08:00:00
  Filled 2012-02-07: qty 3

## 2012-02-07 MED ORDER — VANCOMYCIN HCL 1000 MG IV SOLR
1500.0000 mg | Freq: Three times a day (TID) | INTRAVENOUS | Status: DC
  Administered 2012-02-07 – 2012-02-08 (×2): 1500 mg via INTRAVENOUS
  Filled 2012-02-07 (×6): qty 1500

## 2012-02-07 MED ORDER — SODIUM CHLORIDE 0.9 % IJ SOLN: 10.0000 mL | INTRAMUSCULAR | Status: DC | PRN

## 2012-02-07 MED ORDER — SODIUM CHLORIDE 0.9 % IJ SOLN: 10.0000 mL | Freq: Two times a day (BID) | INTRAMUSCULAR | Status: DC

## 2012-02-07 MED ORDER — FLUTICASONE PROPIONATE 50 MCG/ACT NA SUSP
2.0000 | Freq: Every day | NASAL | Status: DC
  Administered 2012-02-07 – 2012-02-08 (×2): 2 via NASAL
  Filled 2012-02-07: qty 16

## 2012-02-07 MED ORDER — LEVOFLOXACIN 500 MG PO TABS
750.0000 mg | ORAL_TABLET | Freq: Every day | ORAL | Status: DC
  Administered 2012-02-07 – 2012-02-08 (×2): 750 mg via ORAL
  Filled 2012-02-07 (×2): qty 1

## 2012-02-07 NOTE — Progress Notes (Signed)
Subjective: She says she feels pretty well. She has no new complaints. She is still coughing some. Her x-ray of her knee was okay. She still has nasal congestion  Objective: Vital signs in last 24 hours: Temp:  [97.6 F (36.4 C)-98.1 F (36.7 C)] 97.6 F (36.4 C) (03/17 0509) Pulse Rate:  [78-86] 82  (03/17 0509) Resp:  [16-18] 16  (03/17 0509) BP: (114-135)/(77-82) 120/82 mmHg (03/17 0509) SpO2:  [94 %-98 %] 98 % (03/17 0811) Weight change:  Last BM Date: 02/05/12  Intake/Output from previous day: 03/16 0701 - 03/17 0700 In: 840 [P.O.:840] Out: -   PHYSICAL EXAM General appearance: alert, cooperative and mild distress Resp: rhonchi bilaterally Cardio: regular rate and rhythm, S1, S2 normal, no murmur, click, rub or gallop GI: soft, non-tender; bowel sounds normal; no masses,  no organomegaly Extremities: extremities normal, atraumatic, no cyanosis or edema  Lab Results:    Basic Metabolic Panel:  Basename 02/05/12 0502  NA 137  K 4.0  CL 101  CO2 30  GLUCOSE 78  BUN 10  CREATININE 0.58  CALCIUM 9.1  MG --  PHOS --   Liver Function Tests: No results found for this basename: AST:2,ALT:2,ALKPHOS:2,BILITOT:2,PROT:2,ALBUMIN:2 in the last 72 hours No results found for this basename: LIPASE:2,AMYLASE:2 in the last 72 hours No results found for this basename: AMMONIA:2 in the last 72 hours CBC: No results found for this basename: WBC:2,NEUTROABS:2,HGB:2,HCT:2,MCV:2,PLT:2 in the last 72 hours Cardiac Enzymes: No results found for this basename: CKTOTAL:3,CKMB:3,CKMBINDEX:3,TROPONINI:3 in the last 72 hours BNP: No results found for this basename: PROBNP:3 in the last 72 hours D-Dimer: No results found for this basename: DDIMER:2 in the last 72 hours CBG:  Basename 02/07/12 0803 02/06/12 2118 02/06/12 1605 02/06/12 1117 02/06/12 0721 02/05/12 2106  GLUCAP 94 103* 186* 192* 144* 147*   Hemoglobin A1C: No results found for this basename: HGBA1C in the last 72  hours Fasting Lipid Panel: No results found for this basename: CHOL,HDL,LDLCALC,TRIG,CHOLHDL,LDLDIRECT in the last 72 hours Thyroid Function Tests: No results found for this basename: TSH,T4TOTAL,FREET4,T3FREE,THYROIDAB in the last 72 hours Anemia Panel: No results found for this basename: VITAMINB12,FOLATE,FERRITIN,TIBC,IRON,RETICCTPCT in the last 72 hours Coagulation: No results found for this basename: LABPROT:2,INR:2 in the last 72 hours Urine Drug Screen: Drugs of Abuse     Component Value Date/Time   LABOPIA  Value: POSITIVE (NOTE) Sent for confirmatory testing Result repeated and verified.* 02/26/2011 2234   LABOPIA POSITIVE* 05/01/2010 1819   COCAINSCRNUR NEGATIVE 02/26/2011 2234   COCAINSCRNUR NONE DETECTED 05/01/2010 1819   LABBENZ  Value: POSITIVE QUANTITY NOT SUFFICIENT, UNABLE TO PERFORM TEST Unable to perform confirmation testing per [facility] (NOTE) Sent for confirmatory testing Result repeated and verified.* 02/26/2011 2234   LABBENZ POSITIVE* 05/01/2010 1819   AMPHETMU NEGATIVE 02/26/2011 2234   AMPHETMU NONE DETECTED 05/01/2010 1819   THCU POSITIVE* 05/01/2010 1819   LABBARB  Value: NONE DETECTED        DRUG SCREEN FOR MEDICAL PURPOSES ONLY.  IF CONFIRMATION IS NEEDED FOR ANY PURPOSE, NOTIFY LAB WITHIN 5 DAYS.        LOWEST DETECTABLE LIMITS FOR URINE DRUG SCREEN Drug Class       Cutoff (ng/mL) Amphetamine      1000 Barbiturate      200 Benzodiazepine   200 Tricyclics       300 Opiates          300 Cocaine          300 THC  50 05/01/2010 1819    Alcohol Level: No results found for this basename: ETH:2 in the last 72 hours Urinalysis: No results found for this basename: COLORURINE:2,APPERANCEUR:2,LABSPEC:2,PHURINE:2,GLUCOSEU:2,HGBUR:2,BILIRUBINUR:2,KETONESUR:2,PROTEINUR:2,UROBILINOGEN:2,NITRITE:2,LEUKOCYTESUR:2 in the last 72 hours Misc. Labs:  ABGS No results found for this basename: PHART,PCO2,PO2ART,TCO2,HCO3 in the last 72 hours CULTURES Recent Results (from  the past 240 hour(s))  CULTURE, BLOOD (ROUTINE X 2)     Status: Normal (Preliminary result)   Collection Time   02/03/12  4:05 PM      Component Value Range Status Comment   Specimen Description BLOOD LEFT ANTECUBITAL   Final    Special Requests     Final    Value: BOTTLES DRAWN AEROBIC AND ANAEROBIC 6CC  IMMUNE:COMPRM   Culture NO GROWTH 4 DAYS   Final    Report Status PENDING   Incomplete   CULTURE, BLOOD (ROUTINE X 2)     Status: Normal (Preliminary result)   Collection Time   02/03/12  4:05 PM      Component Value Range Status Comment   Specimen Description BLOOD RIGHT HAND   Final    Special Requests     Final    Value: BOTTLES DRAWN AEROBIC AND ANAEROBIC 5CC  IMMUNE:COMPRM   Culture NO GROWTH 4 DAYS   Final    Report Status PENDING   Incomplete   MRSA PCR SCREENING     Status: Normal   Collection Time   02/03/12  4:35 PM      Component Value Range Status Comment   MRSA by PCR NEGATIVE  NEGATIVE  Final   CULTURE, SPUTUM-ASSESSMENT     Status: Normal   Collection Time   02/04/12  4:30 AM      Component Value Range Status Comment   Specimen Description SPUTUM EXPECTORATED   Final    Special Requests IMMUNE:COMPRM   Final    Sputum evaluation     Final    Value: THIS SPECIMEN IS ACCEPTABLE. RESPIRATORY CULTURE REPORT TO FOLLOW.     Performed at [facility]   Report Status 02/04/2012 FINAL   Final   CULTURE, RESPIRATORY     Status: Normal   Collection Time   02/04/12  4:30 AM      Component Value Range Status Comment   Specimen Description SPUTUM EXPECTORATED   Final    Special Requests IMMUNE:COMPRM   Final    Gram Stain     Final    Value: ABUNDANT WBC PRESENT, PREDOMINANTLY PMN     FEW SQUAMOUS EPITHELIAL CELLS PRESENT     FEW GRAM POSITIVE COCCI     IN PAIRS FEW YEAST   Culture NORMAL OROPHARYNGEAL FLORA   Final    Report Status 02/07/2012 FINAL   Final    Studies/Results: Dg Chest Port 1 View  02/05/2012  *RADIOLOGY REPORT*  Clinical Data: PICC line  placement.  PORTABLE CHEST - 1 VIEW  Comparison: 02/03/2012.  Findings: The cardiac silhouette, mediastinal and hilar contours are within normal limits and stable.  The lungs show improved aeration.  The right PICC line tip it is in the distal SVC near the cavoatrial junction.  No complicating features.  IMPRESSION: Right sided PICC line in good position with the tip in the distal SVC.  Original Report Authenticated By: P. Loralie Champagne, M.D.   Dg Knee Complete 4 Views Left  02/06/2012  *RADIOLOGY REPORT*  Clinical Data: Left knee pain.  LEFT KNEE - COMPLETE 4+ VIEW  Comparison: None.  Findings: Osseous structures are  normal.  Suggestion of a tiny joint effusion.  IMPRESSION: Tiny joint effusion.  Original Report Authenticated By: Gwynn Burly, M.D.    Medications:  Scheduled:   . antiseptic oral rinse  15 mL Mouth Rinse BID  . ceFEPime (MAXIPIME) IV  1 g Intravenous Q12H  . fenofibrate  160 mg Oral Daily  . fluticasone  2 spray Each Nare Daily  . Fluticasone-Salmeterol  1 puff Inhalation Q12H  . furosemide  20 mg Oral Daily  . glyBURIDE  5 mg Oral BID WC  . heparin  5,000 Units Subcutaneous Q8H  . insulin aspart  0-20 Units Subcutaneous TID WC  . insulin aspart  0-5 Units Subcutaneous QHS  . levofloxacin (LEVAQUIN) IV  750 mg Intravenous Q24H  . metFORMIN  500 mg Oral BID WC  . polyethylene glycol  17 g Oral Daily  . potassium chloride SA  20 mEq Oral BID  . predniSONE  40 mg Oral Q breakfast  . sodium chloride  3 mL Intravenous Q12H  . sodium chloride      . tiotropium  18 mcg Inhalation Daily  . tiZANidine  4 mg Oral TID  . vancomycin  1,000 mg Intravenous Q8H   Continuous:  ZOX:WRUEAV chloride, acetaminophen, albuterol, albuterol, HYDROmorphone (DILAUDID) injection, ibuprofen, magnesium hydroxide, ondansetron (ZOFRAN) IV, oxyCODONE-acetaminophen, sodium chloride, sodium chloride  Assesment: She seems to be better. She wants to know she could go home tomorrow and I think is  probably okay. I've added Flonase for her nasal congestion. She will continue with her other medications. Principal Problem:  *Healthcare-associated pneumonia Active Problems:  COPD  DM (diabetes mellitus)    Plan: No change in treatments continue meds and follow. She could potentially go home tomorrow    LOS: 4 days   Alexandria Shiflett L 02/07/2012, 10:32 AM

## 2012-02-07 NOTE — Plan of Care (Signed)
Problem: Phase III Progression Outcomes Goal: Activity at appropriate level-compared to baseline (UP IN CHAIR FOR HEMODIALYSIS)  Outcome: Completed/Met Date Met:  02/07/12 Pt ambulating in the halls greater that 200 feet multiple times per day.

## 2012-02-07 NOTE — Progress Notes (Signed)
ANTIBIOTIC CONSULT NOTE   Pharmacy Consult for Vancomycin, Levaquin, Cefepime Indication: rule out pneumonia (HCAP)  Allergies  Allergen Reactions  . Penicillins Other (See Comments)    convulsions  . Morphine Nausea Only   Patient Measurements: Height: 5\' 1"  (154.9 cm) Weight: 174 lb 9.7 oz (79.2 kg) IBW/kg (Calculated) : 47.8   Vital Signs: Temp: 97.6 F (36.4 C) (03/17 0509) BP: 120/82 mmHg (03/17 0509) Pulse Rate: 82  (03/17 0509) Intake/Output from previous day: 03/16 0701 - 03/17 0700 In: 840 [P.O.:840] Out: -  Intake/Output from this shift: Total I/O In: 480 [P.O.:480] Out: -   Labs:  Basename 02/05/12 0502  WBC --  HGB --  PLT --  LABCREA --  CREATININE 0.58   Estimated Creatinine Clearance: 86.5 ml/min (by C-G formula based on Cr of 0.58).  Basename 02/07/12 0750 02/05/12 0502  VANCOTROUGH 9.9* 5.0*  VANCOPEAK -- --  Drue Dun -- --  GENTTROUGH -- --  GENTPEAK -- --  GENTRANDOM -- --  TOBRATROUGH -- --  TOBRAPEAK -- --  TOBRARND -- --  AMIKACINPEAK -- --  AMIKACINTROU -- --  AMIKACIN -- --     Microbiology: Recent Results (from the past 720 hour(s))  CULTURE, BLOOD (ROUTINE X 2)     Status: Normal (Preliminary result)   Collection Time   02/03/12  4:05 PM      Component Value Range Status Comment   Specimen Description BLOOD LEFT ANTECUBITAL   Final    Special Requests     Final    Value: BOTTLES DRAWN AEROBIC AND ANAEROBIC 6CC  IMMUNE:COMPRM   Culture NO GROWTH 4 DAYS   Final    Report Status PENDING   Incomplete   CULTURE, BLOOD (ROUTINE X 2)     Status: Normal (Preliminary result)   Collection Time   02/03/12  4:05 PM      Component Value Range Status Comment   Specimen Description BLOOD RIGHT HAND   Final    Special Requests     Final    Value: BOTTLES DRAWN AEROBIC AND ANAEROBIC 5CC  IMMUNE:COMPRM   Culture NO GROWTH 4 DAYS   Final    Report Status PENDING   Incomplete   MRSA PCR SCREENING     Status: Normal   Collection Time    02/03/12  4:35 PM      Component Value Range Status Comment   MRSA by PCR NEGATIVE  NEGATIVE  Final   CULTURE, SPUTUM-ASSESSMENT     Status: Normal   Collection Time   02/04/12  4:30 AM      Component Value Range Status Comment   Specimen Description SPUTUM EXPECTORATED   Final    Special Requests IMMUNE:COMPRM   Final    Sputum evaluation     Final    Value: THIS SPECIMEN IS ACCEPTABLE. RESPIRATORY CULTURE REPORT TO FOLLOW.     Performed at [facility]   Report Status 02/04/2012 FINAL   Final   CULTURE, RESPIRATORY     Status: Normal   Collection Time   02/04/12  4:30 AM      Component Value Range Status Comment   Specimen Description SPUTUM EXPECTORATED   Final    Special Requests IMMUNE:COMPRM   Final    Gram Stain     Final    Value: ABUNDANT WBC PRESENT, PREDOMINANTLY PMN     FEW SQUAMOUS EPITHELIAL CELLS PRESENT     FEW GRAM POSITIVE COCCI     IN PAIRS FEW  YEAST   Culture NORMAL OROPHARYNGEAL FLORA   Final    Report Status 02/07/2012 FINAL   Final     Medical History: Past Medical History  Diagnosis Date  . Migraine headache   . On home O2   . COPD (chronic obstructive pulmonary disease)   . Bipolar disorder   . Chronic back pain   . Hyperglycemia, drug-induced     steroid induced hyperglycemia  . ARDS (adult respiratory distress syndrome)     Jan 2011  . Diabetes mellitus   . Pneumonia   . Angina   . Asthma   . Shortness of breath   . Recurrent upper respiratory infection (URI)    Medications:  Scheduled:     . antiseptic oral rinse  15 mL Mouth Rinse BID  . ceFEPime (MAXIPIME) IV  1 g Intravenous Q12H  . fenofibrate  160 mg Oral Daily  . fluticasone  2 spray Each Nare Daily  . Fluticasone-Salmeterol  1 puff Inhalation Q12H  . furosemide  20 mg Oral Daily  . glyBURIDE  5 mg Oral BID WC  . heparin  5,000 Units Subcutaneous Q8H  . insulin aspart  0-20 Units Subcutaneous TID WC  . insulin aspart  0-5 Units Subcutaneous QHS  . levofloxacin  (LEVAQUIN) IV  750 mg Intravenous Q24H  . metFORMIN  500 mg Oral BID WC  . polyethylene glycol  17 g Oral Daily  . potassium chloride SA  20 mEq Oral BID  . predniSONE  40 mg Oral Q breakfast  . sodium chloride  3 mL Intravenous Q12H  . sodium chloride      . tiotropium  18 mcg Inhalation Daily  . tiZANidine  4 mg Oral TID  . vancomycin  1,000 mg Intravenous Q8H   Assessment: Obesity Good renal fxn, renal fxn stable Trough below goal. Micro (-) to date.  Goal of Therapy:  Vancomycin trough level 15-20 mcg/ml  Plan: Increase Vancomycin 1500 mg  iv q8hrs Repeat trough 3/19 if therapy continues. No Change Cefepime 1gm iv q12hrs. Change Levaquin to 750mg  PO q24hrs per policy. De-escalate ABX when appropriate F/U micro data Labs per protocol.  PHARMACIST - PHYSICIAN COMMUNICATION DR:   [name] CONCERNING: Antibiotic IV to Oral Route Change Policy  RECOMMENDATION: This patient is receiving Levaquin by the intravenous route.  Based on criteria approved by the Pharmacy and Therapeutics Committee, the antibiotic(s) is/are being converted to the equivalent oral dose form(s).   DESCRIPTION: These criteria include:  Patient being treated for a respiratory tract infection, urinary tract infection, or cellulitis  The patient is not neutropenic and does not exhibit a GI malabsorption state  The patient is eating (either orally or via tube) and/or has been taking other orally administered medications for a least 24 hours  The patient is improving clinically and has a Tmax < 100.5  If you have questions about this conversion, please contact the Pharmacy Department  [x]   [phone] )  [facility] []   [phone] )  [facility]  []   [phone] )  [facility] []   [phone] )  [facility]   Lamonte Richer R 02/07/2012,11:46 AM

## 2012-02-08 LAB — CULTURE, BLOOD (ROUTINE X 2)
Culture: NO GROWTH
Culture: NO GROWTH

## 2012-02-08 LAB — BASIC METABOLIC PANEL
CO2: 36 mEq/L — ABNORMAL HIGH (ref 19–32)
Chloride: 96 mEq/L (ref 96–112)
Creatinine, Ser: 0.58 mg/dL (ref 0.50–1.10)
GFR calc Af Amer: >90 mL/min (ref >90)
Potassium: 4 mEq/L (ref 3.5–5.1)

## 2012-02-08 MED ORDER — LEVOFLOXACIN 500 MG PO TABS: 500.0000 mg | ORAL_TABLET | Freq: Every day | ORAL | Status: DC

## 2012-02-08 MED ORDER — ONDANSETRON HCL 4 MG PO TABS: 4.0000 mg | ORAL_TABLET | Freq: Three times a day (TID) | ORAL | Status: AC | PRN

## 2012-02-08 MED ORDER — PREDNISONE (PAK) 10 MG PO TABS: 10.0000 mg | ORAL_TABLET | Freq: Every day | ORAL | Status: DC

## 2012-02-08 NOTE — Discharge Instructions (Signed)
Alpha-1 Antitrypsin Deficiency Alpha-1 antitrypsin (AAT) deficiency is a genetic condition that can cause liver and lung disease in children and adults. AAT is a protein that is made in the liver. It works to protect the lungs and liver. AAT deficiency means there is not enough of this protein in the body. AAT deficiency is most common among Caucasians.  CAUSES  Alpha-1 antitrypsin deficiency is passed from parent to child (inherited). Under normal conditions, the liver makes AAT and releases it into the bloodstream. When there is an AAT deficiency, people have none or very little of this protein.  SYMPTOMS   Shortness of breath.   Wheezing or non-responsive asthma.   Coughing.   Recurring respiratory infection.   Signs of liver disease:   Abdominal pain.   Jaundice.   Swelling of the ankles or abdomen.  DIAGNOSIS   AAT deficiency is diagnosed by a blood test.   Your caregiver may order other tests such as:   Chest x-ray.   Chest CT scan.   Lung function test.   Liver function test.   Genetic testing.  TREATMENT  AAT deficiency can be treated but not cured.   Replacement alpha-1 protein can help treat this deficiency.   Do not smoke. If you smoke and have this deficiency, it can lead to lung problems such as:   Chronic Obstructive Pulmonary Disease (COPD).   Emphysema.   Genetic counseling for affected families is recommended.   If you have emphysema, treatment may include:   Use of inhalers.   Oxygen. You may need to use home oxygen.   Use of steroids. These can help decrease inflammation in your lungs.   Get Hepatitis A and B vaccinations.   Do not drink alcohol.   Eat a healthy, well balanced diet that includes lots of fresh fruits and vegetables.   Organ transplantation may be considered for people with severe lung or liver disease.  Pneumonia, Adult Pneumonia is an infection of the lungs.  CAUSES Pneumonia may be caused by bacteria or a virus.  Usually, these infections are caused by breathing infectious particles into the lungs (respiratory tract). SYMPTOMS   Cough.   Fever.   Chest pain.   Increased rate of breathing.   Wheezing.   Mucus production.  DIAGNOSIS  If you have the common symptoms of pneumonia, your caregiver will typically confirm the diagnosis with a chest X-ray. The X-ray will show an abnormality in the lung (pulmonary infiltrate) if you have pneumonia. Other tests of your blood, urine, or sputum may be done to find the specific cause of your pneumonia. Your caregiver may also do tests (blood gases or pulse oximetry) to see how well your lungs are working. TREATMENT  Some forms of pneumonia may be spread to other people when you cough or sneeze. You may be asked to wear a mask before and during your exam. Pneumonia that is caused by bacteria is treated with antibiotic medicine. Pneumonia that is caused by the influenza virus may be treated with an antiviral medicine. Most other viral infections must run their course. These infections will not respond to antibiotics.  PREVENTION A pneumococcal shot (vaccine) is available to prevent a common bacterial cause of pneumonia. This is usually suggested for:  People over 30 years old.   Patients on chemotherapy.   People with chronic lung problems, such as bronchitis or emphysema.   People with immune system problems.  If you are over 65 or have a high risk condition, you  may receive the pneumococcal vaccine if you have not received it before. In some countries, a routine influenza vaccine is also recommended. This vaccine can help prevent some cases of pneumonia.You may be offered the influenza vaccine as part of your care. If you smoke, it is time to quit. You may receive instructions on how to stop smoking. Your caregiver can provide medicines and counseling to help you quit. HOME CARE INSTRUCTIONS   Cough suppressants may be used if you are losing too much rest.  However, coughing protects you by clearing your lungs. You should avoid using cough suppressants if you can.   Your caregiver may have prescribed medicine if he or she thinks your pneumonia is caused by a bacteria or influenza. Finish your medicine even if you start to feel better.   Your caregiver may also prescribe an expectorant. This loosens the mucus to be coughed up.   Only take over-the-counter or prescription medicines for pain, discomfort, or fever as directed by your caregiver.   Do not smoke. Smoking is a common cause of bronchitis and can contribute to pneumonia. If you are a smoker and continue to smoke, your cough may last several weeks after your pneumonia has cleared.   A cold steam vaporizer or humidifier in your room or home may help loosen mucus.   Coughing is often worse at night. Sleeping in a semi-upright position in a recliner or using a couple pillows under your head will help with this.   Get rest as you feel it is needed. Your body will usually let you know when you need to rest.  SEEK IMMEDIATE MEDICAL CARE IF:   Your illness becomes worse. This is especially true if you are elderly or weakened from any other disease.   You cannot control your cough with suppressants and are losing sleep.   You begin coughing up blood.   You develop pain which is getting worse or is uncontrolled with medicines.   You have a fever.   Any of the symptoms which initially brought you in for treatment are getting worse rather than better.   You develop shortness of breath or chest pain.  MAKE SURE YOU:   Understand these instructions.   Will watch your condition.   Will get help right away if you are not doing well or get worse.  Document Released: 11/09/2005 Document Revised: 10/29/2011 Document Reviewed: 01/29/2011 [facility] Patient Information 2012 [place], [place].Document Released: 10/10/2004 Document Revised: 10/29/2011 Document Reviewed: 11/09/2005 [facility] Patient  Information 2012 [place], [place].

## 2012-02-08 NOTE — Progress Notes (Signed)
Subjective: She feels much better. She says she wants to go home. She has no new complaints. She says she's at baseline  Objective: Vital signs in last 24 hours: Temp:  [97 F (36.1 C)-98.4 F (36.9 C)] 98.4 F (36.9 C) (03/18 0533) Pulse Rate:  [84-95] 86  (03/18 0533) Resp:  [18-20] 20  (03/18 0533) BP: (110-123)/(66-77) 110/73 mmHg (03/18 0533) SpO2:  [94 %-97 %] 96 % (03/18 0753) Weight change:  Last BM Date: 02/05/12  Intake/Output from previous day: 03/17 0701 - 03/18 0700 In: 1510 [P.O.:960; IV Piggyback:550] Out: 125 [Urine:125]  PHYSICAL EXAM General appearance: alert, cooperative and no distress Resp: rhonchi LUL Cardio: regular rate and rhythm, S1, S2 normal, no murmur, click, rub or gallop GI: soft, non-tender; bowel sounds normal; no masses,  no organomegaly Extremities: extremities normal, atraumatic, no cyanosis or edema  Lab Results:    Basic Metabolic Panel:  Basename 02/08/12 0529  NA 137  K 4.0  CL 96  CO2 36*  GLUCOSE 61*  BUN 13  CREATININE 0.58  CALCIUM 10.0  MG --  PHOS --   Liver Function Tests: No results found for this basename: AST:2,ALT:2,ALKPHOS:2,BILITOT:2,PROT:2,ALBUMIN:2 in the last 72 hours No results found for this basename: LIPASE:2,AMYLASE:2 in the last 72 hours No results found for this basename: AMMONIA:2 in the last 72 hours CBC: No results found for this basename: WBC:2,NEUTROABS:2,HGB:2,HCT:2,MCV:2,PLT:2 in the last 72 hours Cardiac Enzymes: No results found for this basename: CKTOTAL:3,CKMB:3,CKMBINDEX:3,TROPONINI:3 in the last 72 hours BNP: No results found for this basename: PROBNP:3 in the last 72 hours D-Dimer: No results found for this basename: DDIMER:2 in the last 72 hours CBG:  Basename 02/07/12 2039 02/07/12 1614 02/07/12 1118 02/07/12 0803 02/06/12 2118 02/06/12 1605  GLUCAP 100* 144* 140* 94 103* 186*   Hemoglobin A1C: No results found for this basename: HGBA1C in the last 72 hours Fasting Lipid  Panel: No results found for this basename: CHOL,HDL,LDLCALC,TRIG,CHOLHDL,LDLDIRECT in the last 72 hours Thyroid Function Tests: No results found for this basename: TSH,T4TOTAL,FREET4,T3FREE,THYROIDAB in the last 72 hours Anemia Panel: No results found for this basename: VITAMINB12,FOLATE,FERRITIN,TIBC,IRON,RETICCTPCT in the last 72 hours Coagulation: No results found for this basename: LABPROT:2,INR:2 in the last 72 hours Urine Drug Screen: Drugs of Abuse     Component Value Date/Time   LABOPIA  Value: POSITIVE (NOTE) Sent for confirmatory testing Result repeated and verified.* 02/26/2011 2234   LABOPIA POSITIVE* 05/01/2010 1819   COCAINSCRNUR NEGATIVE 02/26/2011 2234   COCAINSCRNUR NONE DETECTED 05/01/2010 1819   LABBENZ  Value: POSITIVE QUANTITY NOT SUFFICIENT, UNABLE TO PERFORM TEST Unable to perform confirmation testing per [facility] (NOTE) Sent for confirmatory testing Result repeated and verified.* 02/26/2011 2234   LABBENZ POSITIVE* 05/01/2010 1819   AMPHETMU NEGATIVE 02/26/2011 2234   AMPHETMU NONE DETECTED 05/01/2010 1819   THCU POSITIVE* 05/01/2010 1819   LABBARB  Value: NONE DETECTED        DRUG SCREEN FOR MEDICAL PURPOSES ONLY.  IF CONFIRMATION IS NEEDED FOR ANY PURPOSE, NOTIFY LAB WITHIN 5 DAYS.        LOWEST DETECTABLE LIMITS FOR URINE DRUG SCREEN Drug Class       Cutoff (ng/mL) Amphetamine      1000 Barbiturate      200 Benzodiazepine   200 Tricyclics       300 Opiates          300 Cocaine          300 THC  50 05/01/2010 1819    Alcohol Level: No results found for this basename: ETH:2 in the last 72 hours Urinalysis: No results found for this basename: COLORURINE:2,APPERANCEUR:2,LABSPEC:2,PHURINE:2,GLUCOSEU:2,HGBUR:2,BILIRUBINUR:2,KETONESUR:2,PROTEINUR:2,UROBILINOGEN:2,NITRITE:2,LEUKOCYTESUR:2 in the last 72 hours Misc. Labs:  ABGS No results found for this basename: PHART,PCO2,PO2ART,TCO2,HCO3 in the last 72 hours CULTURES Recent Results (from the past 240  hour(s))  CULTURE, BLOOD (ROUTINE X 2)     Status: Normal (Preliminary result)   Collection Time   02/03/12  4:05 PM      Component Value Range Status Comment   Specimen Description BLOOD LEFT ANTECUBITAL   Final    Special Requests     Final    Value: BOTTLES DRAWN AEROBIC AND ANAEROBIC 6CC  IMMUNE:COMPRM   Culture NO GROWTH 4 DAYS   Final    Report Status PENDING   Incomplete   CULTURE, BLOOD (ROUTINE X 2)     Status: Normal (Preliminary result)   Collection Time   02/03/12  4:05 PM      Component Value Range Status Comment   Specimen Description BLOOD RIGHT HAND   Final    Special Requests     Final    Value: BOTTLES DRAWN AEROBIC AND ANAEROBIC 5CC  IMMUNE:COMPRM   Culture NO GROWTH 4 DAYS   Final    Report Status PENDING   Incomplete   MRSA PCR SCREENING     Status: Normal   Collection Time   02/03/12  4:35 PM      Component Value Range Status Comment   MRSA by PCR NEGATIVE  NEGATIVE  Final   CULTURE, SPUTUM-ASSESSMENT     Status: Normal   Collection Time   02/04/12  4:30 AM      Component Value Range Status Comment   Specimen Description SPUTUM EXPECTORATED   Final    Special Requests IMMUNE:COMPRM   Final    Sputum evaluation     Final    Value: THIS SPECIMEN IS ACCEPTABLE. RESPIRATORY CULTURE REPORT TO FOLLOW.     Performed at [facility]   Report Status 02/04/2012 FINAL   Final   CULTURE, RESPIRATORY     Status: Normal   Collection Time   02/04/12  4:30 AM      Component Value Range Status Comment   Specimen Description SPUTUM EXPECTORATED   Final    Special Requests IMMUNE:COMPRM   Final    Gram Stain     Final    Value: ABUNDANT WBC PRESENT, PREDOMINANTLY PMN     FEW SQUAMOUS EPITHELIAL CELLS PRESENT     FEW GRAM POSITIVE COCCI     IN PAIRS FEW YEAST   Culture NORMAL OROPHARYNGEAL FLORA   Final    Report Status 02/07/2012 FINAL   Final    Studies/Results: Dg Knee Complete 4 Views Left  02/06/2012  *RADIOLOGY REPORT*  Clinical Data: Left knee pain.  LEFT  KNEE - COMPLETE 4+ VIEW  Comparison: None.  Findings: Osseous structures are normal.  Suggestion of a tiny joint effusion.  IMPRESSION: Tiny joint effusion.  Original Report Authenticated By: Gwynn Burly, M.D.    Medications:  Prior to Admission:  Prescriptions prior to admission  Medication Sig Dispense Refill  . acetaminophen (TYLENOL) 500 MG tablet Take 1,000 mg by mouth every 6 (six) hours as needed. Pain      . albuterol (PROVENTIL) (2.5 MG/3ML) 0.083% nebulizer solution Take 2.5 mg by nebulization every 4 (four) hours as needed. For shortness of breath       .  Choline Fenofibrate (TRILIPIX) 135 MG capsule Take 135 mg by mouth daily.        . Fluticasone-Salmeterol (ADVAIR) 250-50 MCG/DOSE AEPB Inhale 1 puff into the lungs every 12 (twelve) hours.        . furosemide (LASIX) 20 MG tablet Take 20 mg by mouth daily.        [name] glyBURIDE-metformin (GLUCOVANCE) 5-500 MG per tablet Take 1 tablet by mouth 2 (two) times daily with a meal. When sugar is high.      [name] ibuprofen (ADVIL,MOTRIN) 200 MG tablet Take 800 mg by mouth every 6 (six) hours as needed. Pain      . nystatin (MYCOSTATIN) 100000 UNIT/ML suspension Take 500,000 Units by mouth 4 (four) times daily as needed. Thrust      . oxyCODONE-acetaminophen (PERCOCET) 10-325 MG per tablet Take 1 tablet by mouth every 4 (four) hours as needed. For pain      . potassium chloride SA (K-DUR,KLOR-CON) 20 MEQ tablet Take 20 mEq by mouth 2 (two) times daily.        [name] tiZANidine (ZANAFLEX) 4 MG tablet Take 4 mg by mouth 3 (three) times daily.       . VENTOLIN HFA 108 (90 BASE) MCG/ACT inhaler INHALE 2 PUFFS INTO THE LUNGS EVERY 6 (SIX) HOURS AS NEEDED.  18 g  1  . DISCONTD: tiotropium (SPIRIVA) 18 MCG inhalation capsule Place 18 mcg into inhaler and inhale daily.         Scheduled:   . antiseptic oral rinse  15 mL Mouth Rinse BID  . ceFEPime (MAXIPIME) IV  1 g Intravenous Q12H  . fenofibrate  160 mg Oral Daily  . fluticasone  2 spray Each Nare  Daily  . Fluticasone-Salmeterol  1 puff Inhalation Q12H  . furosemide  20 mg Oral Daily  . glyBURIDE  5 mg Oral BID WC  . heparin  5,000 Units Subcutaneous Q8H  . insulin aspart  0-20 Units Subcutaneous TID WC  . insulin aspart  0-5 Units Subcutaneous QHS  . levofloxacin  750 mg Oral Daily  . metFORMIN  500 mg Oral BID WC  . polyethylene glycol  17 g Oral Daily  . potassium chloride SA  20 mEq Oral BID  . predniSONE  40 mg Oral Q breakfast  . sodium chloride  10-40 mL Intracatheter Q12H  . sodium chloride  3 mL Intravenous Q12H  . tiotropium  18 mcg Inhalation Daily  . tiZANidine  4 mg Oral TID  . vancomycin  1,500 mg Intravenous Q8H  . DISCONTD: levofloxacin (LEVAQUIN) IV  750 mg Intravenous Q24H  . DISCONTD: vancomycin  1,000 mg Intravenous Q8H   Continuous:  ZOX:WRUEAV chloride, acetaminophen, albuterol, albuterol, HYDROmorphone (DILAUDID) injection, ibuprofen, magnesium hydroxide, ondansetron (ZOFRAN) IV, oxyCODONE-acetaminophen, sodium chloride, sodium chloride, sodium chloride  Assesment: She was admitted with healthcare associated pneumonia and COPD. She seems to be improving. She wants to go home. Principal Problem:  *Healthcare-associated pneumonia Active Problems:  COPD  DM (diabetes mellitus)    Plan: I think it's okay for her to be discharged and I have discussed this with her attending physician Dr. [name] and he plans for discharge today    LOS: 5 days   Natalin Bible L 02/08/2012, 8:31 AM

## 2012-02-08 NOTE — Progress Notes (Signed)
Pt discharged with instructions prescriptions and care notes.  Pt and family verbalized understanding and all questions were answered.  Pt left the floor ambulating and in stable condition.

## 2012-02-08 NOTE — Discharge Summary (Signed)
NAME:  [name], [name]                 ACCOUNT NO.:  [id number]  MEDICAL RECORD NO.:  [id number]  LOCATION:  A338                          FACILITY:  [facility]  PHYSICIAN:  Nikolina Simerson D. [name], MD   DATE OF BIRTH:  Apr 11, 1966  DATE OF ADMISSION:  02/03/2012 DATE OF DISCHARGE:  03/18/2013LH                              DISCHARGE SUMMARY   DISCHARGE DIAGNOSES: 1. Acute exacerbation of chronic obstructive pulmonary disease. 2. Healthcare-associated pneumonia. 3. Diabetes mellitus. 4. Obesity. 5. Chronic pain syndrome.  DISCHARGE MEDICATIONS: 1. Levaquin 500 mg p.o. daily. 2. Zofran 1 tablet q.8 hours p.r.n. 3. Prednisolone dose pack. 4. Acetaminophen 500 mg 2 tablets q.6 hours p.r.n. 5. Atrovent and Proventil nebulizer q.6 hours p.r.n. 6. Advair 250/50 one puff b.i.d. 7. Lasix 20 mg daily. 8. Glucovance 5/500 one tablet b.i.d. 9. Ibuprofen 800 mg q.6 hours p.r.n. 10.Percocet 10/325 one tablet q.4 hours p.r.n. 11.KCl 20 mEq daily. 12.Zanaflex 4 mg t.i.d. 13.Fenofibrate 135 mg daily.  DISPOSITION:  The patient will be discharged home in stable condition.  DISCHARGE INSTRUCTIONS:  The patient will be followed in the office in 1 week duration.  LABORATORY DATA ON DISCHARGE:  Sodium 137, potassium 4.0, chloride 96, carbon dioxide 36, glucose 61, BUN 13, creatinine 0.8.  HOSPITAL COURSE:  This is a 46 year old female patient with history of multiple medical illnesses including chronic obstructive pulmonary disease, came to emergency room due to cough with productive sputum and wheezing and fever.  She was admitted as a case of healthcare-associated pneumonia.  The patient was treated with combination of IV antibiotics.  She received also nebulizer treatment and IV steroid.  Over the hospital stay, the patient improved.  She is going to be discharged with oral antibiotics and oral steroid.  She will be followed in the office in 1 week duration.     Shade Kaley D. [name],  MD     TDF/MEDQ  D:  02/08/2012  T:  02/08/2012  Job:  [id number]

## 2012-02-08 NOTE — Progress Notes (Signed)
Utilization review completed.  

## 2012-02-13 ENCOUNTER — Encounter (HOSPITAL_COMMUNITY): Payer: Self-pay | Admitting: Emergency Medicine

## 2012-02-13 ENCOUNTER — Emergency Department (HOSPITAL_COMMUNITY)
Admission: EM | Admit: 2012-02-13 | Discharge: 2012-02-14 | Disposition: A | Discharge status: Home / Self Care | Payer: Medicaid Other | Attending: Emergency Medicine | Admitting: Emergency Medicine

## 2012-02-13 DIAGNOSIS — M2550 Pain in unspecified joint: Secondary | ICD-10-CM | POA: Insufficient documentation

## 2012-02-13 DIAGNOSIS — IMO0001 Reserved for inherently not codable concepts without codable children: Secondary | ICD-10-CM | POA: Insufficient documentation

## 2012-02-13 DIAGNOSIS — M79609 Pain in unspecified limb: Secondary | ICD-10-CM | POA: Insufficient documentation

## 2012-02-13 DIAGNOSIS — M545 Low back pain, unspecified: Secondary | ICD-10-CM | POA: Insufficient documentation

## 2012-02-13 DIAGNOSIS — J449 Chronic obstructive pulmonary disease, unspecified: Secondary | ICD-10-CM | POA: Insufficient documentation

## 2012-02-13 DIAGNOSIS — M51379 Other intervertebral disc degeneration, lumbosacral region without mention of lumbar back pain or lower extremity pain: Secondary | ICD-10-CM | POA: Insufficient documentation

## 2012-02-13 DIAGNOSIS — J4489 Other specified chronic obstructive pulmonary disease: Secondary | ICD-10-CM | POA: Insufficient documentation

## 2012-02-13 DIAGNOSIS — E119 Type 2 diabetes mellitus without complications: Secondary | ICD-10-CM | POA: Insufficient documentation

## 2012-02-13 DIAGNOSIS — M5430 Sciatica, unspecified side: Secondary | ICD-10-CM | POA: Insufficient documentation

## 2012-02-13 DIAGNOSIS — M5432 Sciatica, left side: Secondary | ICD-10-CM

## 2012-02-13 DIAGNOSIS — M5137 Other intervertebral disc degeneration, lumbosacral region: Secondary | ICD-10-CM | POA: Insufficient documentation

## 2012-02-13 DIAGNOSIS — F319 Bipolar disorder, unspecified: Secondary | ICD-10-CM | POA: Insufficient documentation

## 2012-02-13 DIAGNOSIS — Z79899 Other long term (current) drug therapy: Secondary | ICD-10-CM | POA: Insufficient documentation

## 2012-02-13 MED ORDER — HYDROMORPHONE HCL PF 1 MG/ML IJ SOLN
1.0000 mg | Freq: Once | INTRAMUSCULAR | Status: AC
  Administered 2012-02-14: 1 mg via INTRAMUSCULAR
  Filled 2012-02-13: qty 1

## 2012-02-13 MED ORDER — DIAZEPAM 5 MG/ML IJ SOLN
5.0000 mg | Freq: Once | INTRAMUSCULAR | Status: AC
  Administered 2012-02-14: 5 mg via INTRAMUSCULAR
  Filled 2012-02-13 (×2): qty 2

## 2012-02-13 NOTE — ED Notes (Signed)
Pt. C/o left leg pain which started 3 days ago.  Pain came on gradually and worsened last evening.  Pt. Describes pain as sharp and radiates down her left leg to the  top of her left foot and around her ankle.  Pt. Has a history of chronic back pain and states she is out of her pain meds.

## 2012-02-14 MED ORDER — OXYCODONE-ACETAMINOPHEN 10-325 MG PO TABS: 1.0000 | ORAL_TABLET | ORAL | Status: DC | PRN

## 2012-02-14 MED ORDER — PREDNISONE 10 MG PO TABS: 40.0000 mg | ORAL_TABLET | Freq: Every day | ORAL | Status: DC

## 2012-02-14 MED ORDER — TIZANIDINE HCL 4 MG PO TABS: 4.0000 mg | ORAL_TABLET | Freq: Three times a day (TID) | ORAL | Status: DC

## 2012-02-14 NOTE — ED Provider Notes (Signed)
Medical screening examination/treatment/procedure(s) were performed by non-physician practitioner and as supervising physician I was immediately available for consultation/collaboration.   Glynn Octave, MD 02/14/12 [phone]

## 2012-02-14 NOTE — Discharge Instructions (Signed)
Chronic Back Pain When back pain lasts longer than 3 months, it is called chronic back pain.This pain can be frustrating, but the cause of the pain is rarely dangerous.People with chronic back pain often go through certain periods that are more intense (flare-ups). CAUSES Chronic back pain can be caused by wear and tear (degeneration) on different structures in your back. These structures may include bones, ligaments, or discs. This degeneration may result in more pressure being placed on the nerves that travel to your legs and feet. This can lead to pain traveling from the low back down the back of the legs. When pain lasts longer than 3 months, it is not unusual for people to experience anxiety or depression. Anxiety and depression can also contribute to low back pain. TREATMENT  Establish a regular exercise plan. This is critical to improving your functional level.   Have a self-management plan for when you flare-up. Flare-ups rarely require a medical visit. Regular exercise will help reduce the intensity and frequency of your flare-ups.   Manage how you feel about your back pain and the rest of your life. Anxiety, depression, and feeling that you cannot alter your back pain have been shown to make back pain more intense and debilitating.   Medicines should never be your only treatment. They should be used along with other treatments to help you return to a more active lifestyle.   Procedures such as injections or surgery may be helpful but are rarely necessary. You may be able to get the same results with physical therapy or chiropractic care.  HOME CARE INSTRUCTIONS  Avoid bending, heavy lifting, prolonged sitting, and activities which make the problem worse.   Continue normal activity as much as possible.   Take brief periods of rest throughout the day to reduce your pain during flare-ups.   Follow your back exercise rehabilitation program. This can help reduce symptoms and prevent  more pain.   Only take over-the-counter or prescription medicines as directed by your caregiver. Muscle relaxants are sometimes prescribed. Narcotic pain medicine is discouraged for long-term pain, since addiction is a possible outcome.   If you smoke, quit.   Eat healthy foods and maintain a recommended body weight.  SEEK IMMEDIATE MEDICAL CARE IF:   You have weakness or numbness in one of your legs or feet.   You have trouble controlling your bladder or bowels.   You develop nausea, vomiting, abdominal pain, shortness of breath, or fainting.  Document Released: 12/17/2004 Document Revised: 10/29/2011 Document Reviewed: 10/24/2011 ExitCare Patient Information 2012 [employer].Sciatica Sciatica is a weakness and/or changes in sensation (tingling, jolts, hot and cold, numbness) along the path the sciatic nerve travels. Irritation or damage to lumbar nerve roots is often also referred to as lumbar radiculopathy.  Lumbar radiculopathy (Sciatica) is the most common form of this problem. Radiculopathy can occur in any of the nerves coming out of the spinal cord. The problems caused depend on which nerves are involved. The sciatic nerve is the large nerve supplying the branches of nerves going from the hip to the toes. It often causes a numbness or weakness in the skin and/or muscles that the sciatic nerve serves. It also may cause symptoms (problems) of pain, burning, tingling, or electric shock-like feelings in the path of this nerve. This usually comes from injury to the fibers that make up the sciatic nerve. Some of these symptoms are low back pain and/or unpleasant feelings in the following areas:  From the mid-buttock down   the back of the leg to the back of the knee.   And/or the outside of the calf and top of the foot.   And/or behind the inner ankle to the sole of the foot.  CAUSES   Herniated or slipped disc. Discs are the little cushions between the bones in the back.   Pressure  by the piriformis muscle in the buttock on the sciatic nerve (Piriformis Syndrome).   Misalignment of the bones in the lower back and buttocks (Sacroiliac Joint Derangement).   Narrowing of the spinal canal that puts pressure on or pinches the fibers that make up the sciatic nerve.   A slipped vertebra that is out of line with those above or beneath it.   Abnormality of the nervous system itself so that nerve fibers do not transmit signals properly, especially to feet and calves (neuropathy).   Tumor (this is rare).  Your caregiver can usually determine the cause of your sciatica and begin the treatment most likely to help you. TREATMENT  Taking over-the-counter painkillers, physical therapy, rest, exercise, spinal manipulation, and injections of anesthetics and/or steroids may be used. Surgery, acupuncture, and Yoga can also be effective. Mind over matter techniques, mental imagery, and changing factors such as your bed, chair, desk height, posture, and activities are other treatments that may be helpful. You and your caregiver can help determine what is best for you. With proper diagnosis, the cause of most sciatica can be identified and removed. Communication and cooperation between your caregiver and you is essential. If you are not successful immediately, do not be discouraged. With time, a proper treatment can be found that will make you comfortable. HOME CARE INSTRUCTIONS   If the pain is coming from a problem in the back, applying ice to that area for 15 to 20 minutes, 3 to 4 times per day while awake, may be helpful. Put the ice in a plastic bag. Place a towel between the bag of ice and your skin.   You may exercise or perform your usual activities if these do not aggravate your pain, or as suggested by your caregiver.   Only take over-the-counter or prescription medicines for pain, discomfort, or fever as directed by your caregiver.   If your caregiver has given you a follow-up  appointment, it is very important to keep that appointment. Not keeping the appointment could result in a chronic or permanent injury, pain, and disability. If there is any problem keeping the appointment, you must call back to this facility for assistance.  SEEK IMMEDIATE MEDICAL CARE IF:   You experience loss of control of bowel or bladder.   You have increasing weakness in the trunk, buttocks, or legs.   There is numbness in any areas from the hip down to the toes.   You have difficulty walking or keeping your balance.   You have any of the above, with fever or forceful vomiting.  Document Released: 11/03/2001 Document Revised: 10/29/2011 Document Reviewed: 06/22/2008 ExitCare Patient Information 2012 [employer]. 

## 2012-02-14 NOTE — ED Provider Notes (Signed)
History     CSN: [id number]  Arrival date & time 02/13/12  2215   First MD Initiated Contact with Patient 02/13/12 2322      Chief Complaint  Patient presents with  . Leg Pain    (Consider location/radiation/quality/duration/timing/severity/associated sxs/prior treatment) HPI Comments: Patient with a history of lower back pain and DDD at L4-5 presents with gradual onset of left buttock pain with radiation down the back of her left leg and into left foot - reports no previous history of sciatica in the past - is out of her pain medication - denies numbness, tingling, worse weakness than before, fever, chills, nausea, or vomiting.  Patient is a 46 y.o. female presenting with leg pain. The history is provided by the patient. No language interpreter was used.  Leg Pain  The incident occurred more than 2 days ago. The incident occurred at home. There was no injury mechanism. The pain is present in the left leg. The quality of the pain is described as burning. The pain is at a severity of 10/10. The pain is severe. The pain has been constant since onset. Pertinent negatives include no numbness, no inability to bear weight, no loss of motion, no muscle weakness, no loss of sensation and no tingling. She reports no foreign bodies present. The symptoms are aggravated by nothing. She has tried nothing for the symptoms. The treatment provided no relief.    Past Medical History  Diagnosis Date  . Migraine headache   . On home O2   . COPD (chronic obstructive pulmonary disease)   . Bipolar disorder   . Chronic back pain   . Hyperglycemia, drug-induced     steroid induced hyperglycemia  . ARDS (adult respiratory distress syndrome)     Jan 2011  . Diabetes mellitus   . Pneumonia   . Angina   . Asthma   . Shortness of breath   . Recurrent upper respiratory infection (URI)     Past Surgical History  Procedure Date  . Tubal ligation   . Uterine ablasion   . C-section   . Tracheostomy    decannulated 12/2009    History reviewed. No pertinent family history.  History  Substance Use Topics  . Smoking status: Former Smoker -- 1.5 packs/day for 33 years    Types: Cigarettes    Quit date: 07/30/2011  . Smokeless tobacco: Not on file   Comment: smoking 1-2 cigarettes every other day  . Alcohol Use: No    OB History    Grav Para Term Preterm Abortions TAB SAB Ect Mult Living                  Review of Systems  Musculoskeletal: Positive for back pain and arthralgias.  Neurological: Negative for tingling and numbness.  All other systems reviewed and are negative.    Allergies  Penicillins and Morphine  Home Medications   Current Outpatient Rx  Name Route Sig Dispense Refill  . ACETAMINOPHEN 500 MG PO TABS Oral Take 1,000 mg by mouth every 6 (six) hours as needed. Pain    . ALBUTEROL SULFATE (2.5 MG/3ML) 0.083% IN NEBU Nebulization Take 2.5 mg by nebulization every 4 (four) hours as needed. For shortness of breath     . CHOLINE FENOFIBRATE 135 MG PO CPDR Oral Take 135 mg by mouth daily.      [name] FLUTICASONE-SALMETEROL 250-50 MCG/DOSE IN AEPB Inhalation Inhale 1 puff into the lungs every 12 (twelve) hours.      [name]  FUROSEMIDE 20 MG PO TABS Oral Take 20 mg by mouth daily.      . GLYBURIDE-METFORMIN 5-500 MG PO TABS Oral Take 1 tablet by mouth 2 (two) times daily with a meal. When sugar is high.    . IBUPROFEN 200 MG PO TABS Oral Take 800 mg by mouth every 6 (six) hours as needed. Pain    . NYSTATIN 100000 UNIT/ML MT SUSP Oral Take 500,000 Units by mouth 4 (four) times daily as needed. Thrust    . ONDANSETRON HCL 4 MG PO TABS Oral Take 1 tablet (4 mg total) by mouth every 8 (eight) hours as needed for nausea. 20 tablet 0  . OXYCODONE-ACETAMINOPHEN 10-325 MG PO TABS Oral Take 1 tablet by mouth every 4 (four) hours as needed. For pain    . POTASSIUM CHLORIDE CRYS ER 20 MEQ PO TBCR Oral Take 20 mEq by mouth 2 (two) times daily.      [name] TIZANIDINE HCL 4 MG PO TABS Oral Take 4  mg by mouth 3 (three) times daily.     . VENTOLIN HFA 108 (90 BASE) MCG/ACT IN AERS  INHALE 2 PUFFS INTO THE LUNGS EVERY 6 (SIX) HOURS AS NEEDED. 18 g 1    BP 131/65  Pulse 105  Temp(Src) 98.6 F (37 C) (Oral)  Resp 18  SpO2 96%  Physical Exam  Nursing note and vitals reviewed. Constitutional: She is oriented to person, place, and time. She appears well-developed and well-nourished. No distress.  HENT:  Head: Normocephalic and atraumatic.  Right Ear: External ear normal.  Left Ear: External ear normal.  Nose: Nose normal.  Mouth/Throat: Oropharynx is clear and moist. No oropharyngeal exudate.  Eyes: Conjunctivae are normal. Pupils are equal, round, and reactive to light. No scleral icterus.  Neck: Normal range of motion. Neck supple.  Cardiovascular: Normal rate, regular rhythm and normal heart sounds.  Exam reveals no gallop and no friction rub.   No murmur heard. Pulmonary/Chest: Effort normal and breath sounds normal. No respiratory distress. She exhibits no tenderness.  Abdominal: Soft. Bowel sounds are normal. She exhibits no distension. There is no tenderness.  Musculoskeletal:       Lumbar back: She exhibits tenderness and bony tenderness. She exhibits normal range of motion, no swelling, no edema and no deformity.       Back:  Lymphadenopathy:    She has no cervical adenopathy.  Neurological: She is alert and oriented to person, place, and time. No cranial nerve deficit.  Skin: Skin is warm and dry. No rash noted. No erythema. No pallor.  Psychiatric: She has a normal mood and affect. Her behavior is normal. Judgment and thought content normal.    ED Course  Procedures (including critical care time)  Labs Reviewed - No data to display No results found.   sciatica    MDM  Patient without concerning neurological signs - likely exacerbation of chronic pain with radiation into right leg consistent with sciatica.  Will refill medications, she has an appointment  with her PCP on Monday.        Izola Price [place], [place] 02/14/12 [phone]

## 2012-02-15 ENCOUNTER — Other Ambulatory Visit: Payer: Self-pay | Admitting: Emergency Medicine

## 2012-02-16 NOTE — Telephone Encounter (Signed)
Diflucan denied. Pt given RX for this at [facility] on 11/26/11 due to thrush. If pt is having problems, she will need to contact the provider's office first before any refills will be given on this medication.

## 2012-03-11 IMAGING — CT CT L SPINE W/ CM
3 of 9 series · 11 of 28 positions shown, 12 images · IV contrast (omnipaque)
Comparison: MRI lumbar spine 08/21/2010

MYELOGRAM INJECTION
TECHNIQUE: Informed consent was obtained from the patient prior to
the procedure, including potential complications of headache,
allergy, infection and pain. Specific instructions were given
regarding 24 hour bedrest post procedure to prevent post-LP
headache.  A timeout procedure was performed.  With the patient
prone, the lower back was prepped with Betadine.  1% Lidocaine was
used for local anesthesia.  Lumbar puncture was performed by the
radiologist at the L3-L4 level using a 22 gauge needle with return
of clear CSF.  15 cc of Omnipaque 180 was injected into the
subarachnoid space .
CLINICAL DATA: Low back pain.  Left leg pain.
TECHNIQUE: Multidetector CT imaging of the lumbar spine was
performed following myelography.  Multiplanar CT image
reconstructions were also generated.

[Series 2: l spine bone · axial · 0.27mm/px · z∈[-224,-142]mm · 2 of 100 slices shown, 3 images]
[im 34/100  soft-tissue]
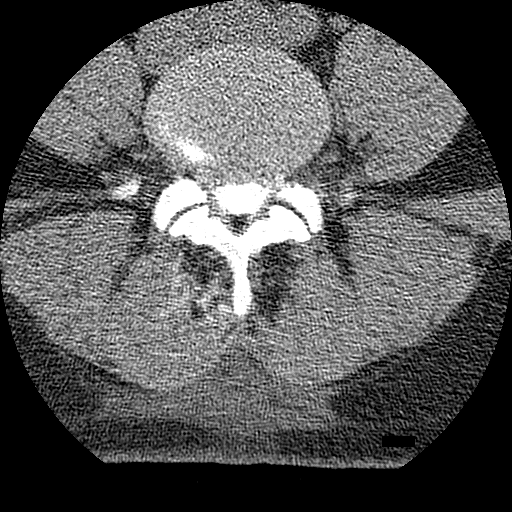
[im 34/100  bone]
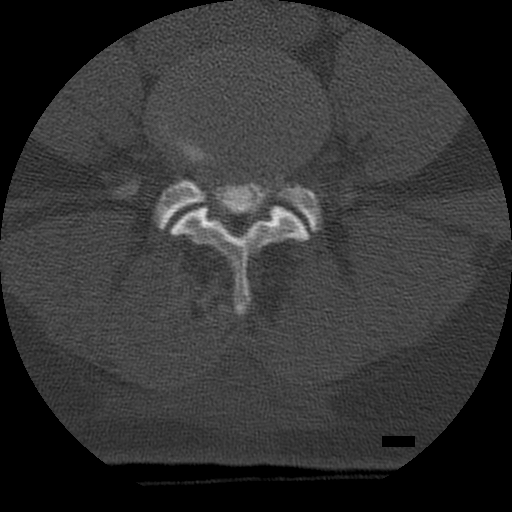
[im 67/100  bone]
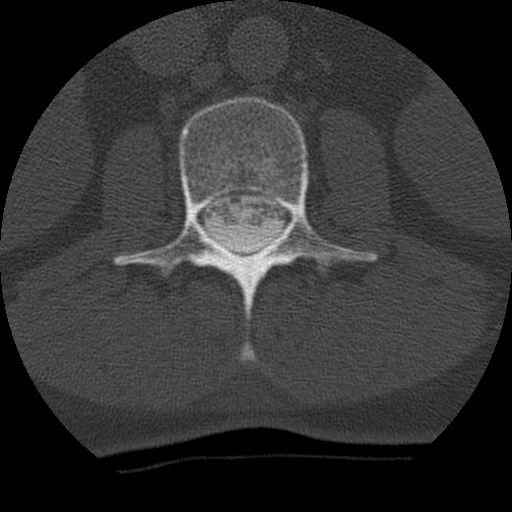

[Series 3: l spine soft · axial · 0.27mm/px · z∈[-246,-122]mm · 3 of 100 slices shown]
[im 25/100  soft-tissue]
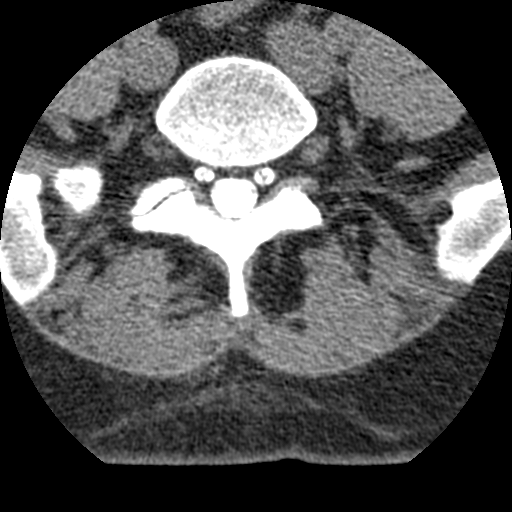
[im 50/100  soft-tissue]
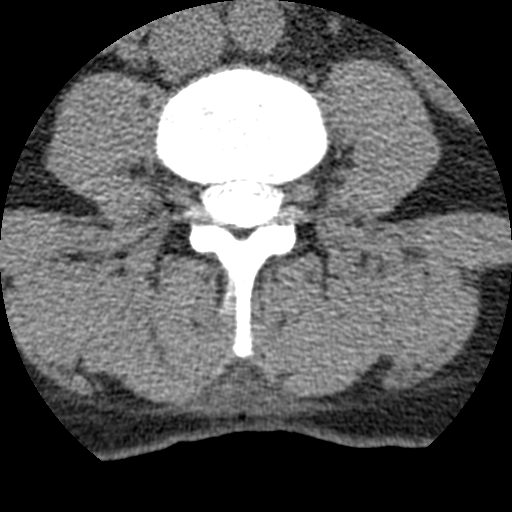
[im 75/100  soft-tissue]
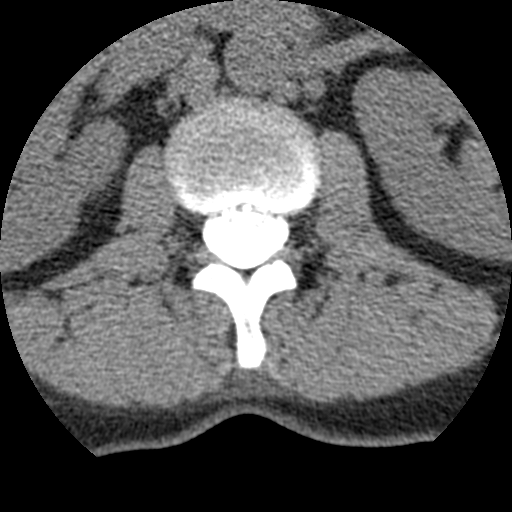

[Series 103: cor · coronal · 0.50mm/px · 6 of 46 slices shown]
[im 8/46  bone]
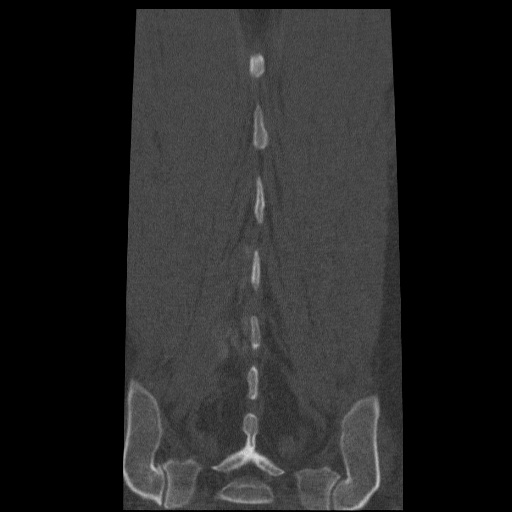
[im 16/46  bone]
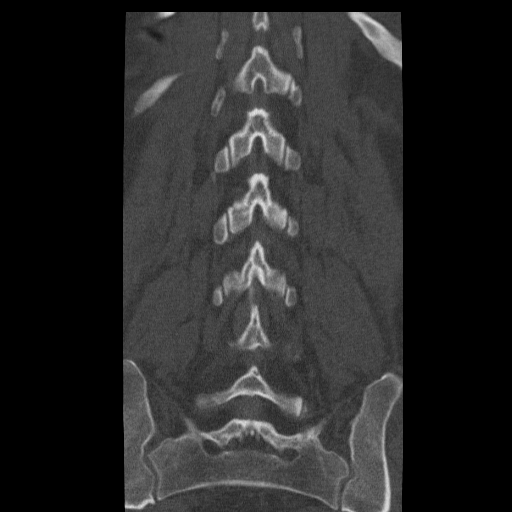
[im 23/46  bone]
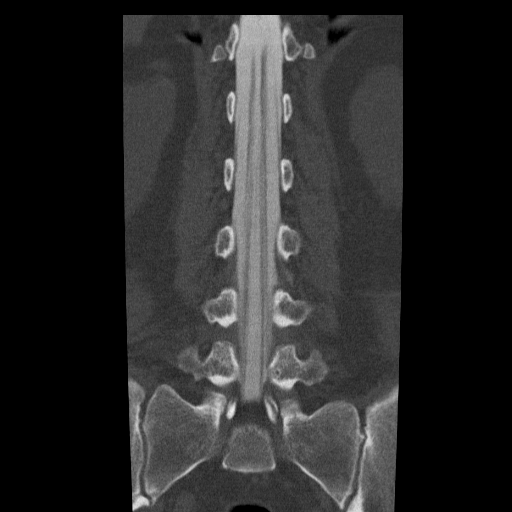
[im 31/46  bone]
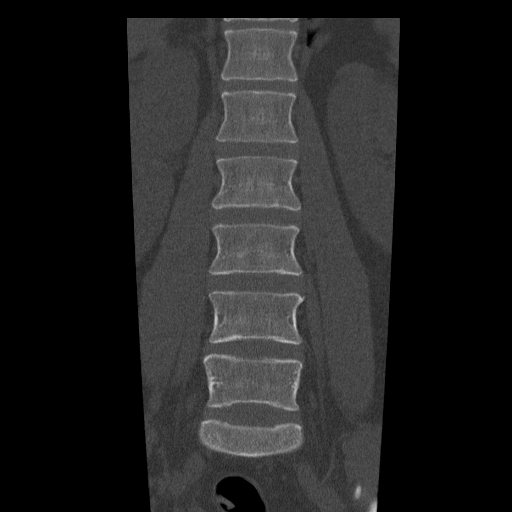
[im 38/46  bone]
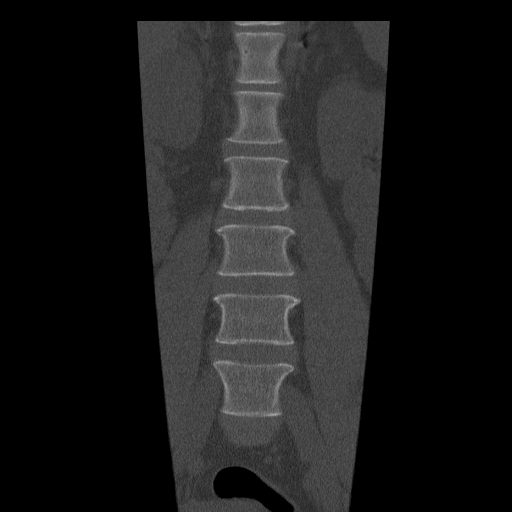
[im 42/46  soft-tissue]
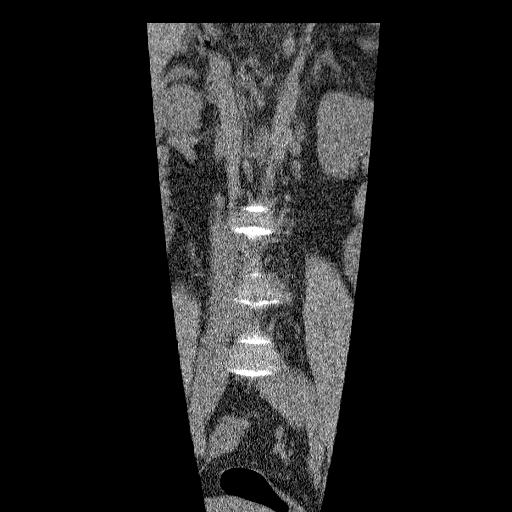

[11 of 28 positions shown; findings below may reference images not displayed]

IMPRESSION: Successful injection of  intrathecal contrast for myelography.

MYELOGRAM LUMBAR
FINDINGS: Good opacification lumbar subarachnoid space.  Mild
effacement both L5 nerve roots with a shallow ventral defect at L4-
L5.  With the patient upright, the protrusion increases
significantly in size.  There is significant increase in the
bilateral L5 nerve root encroachment and stenosis on the upright AP
view.

Fluoroscopy Time: 0.34 minutes
IMPRESSION: As above

CT MYELOGRAPHY LUMBAR SPINE
FINDINGS: No prevertebral or paraspinous masses. Conus normal.

L1-2: Normal.

L2-3: Normal.

L3-4: Normal.

L4-5: Central protrusion is slightly more eccentric to the right
than the left.  There is mild bilateral facet arthropathy.  Central
canal stenosis is accompanied by right greater than left L5 nerve
root encroachment.  No significant neural foraminal narrowing.

L5-S1: Normal.

Compared with prior MRI the appearance is fairly similar.
IMPRESSION: Central disc protrusion at L4-5 is much more apparent with the
patient upright.  There is mild bilateral facet arthropathy at this
level.

Note is made of the fact that the patient's left leg symptoms do
not completely correlate with the imaging appearance which shows a
right greater than left central protrusion.

## 2012-03-11 IMAGING — CR DG MYELOGRAM LUMBAR
4 series · 4 of 4 positions shown · IV contrast (omnipaque)
Comparison: MRI lumbar spine 08/21/2010

MYELOGRAM INJECTION
TECHNIQUE: Informed consent was obtained from the patient prior to
the procedure, including potential complications of headache,
allergy, infection and pain. Specific instructions were given
regarding 24 hour bedrest post procedure to prevent post-LP
headache.  A timeout procedure was performed.  With the patient
prone, the lower back was prepped with Betadine.  1% Lidocaine was
used for local anesthesia.  Lumbar puncture was performed by the
radiologist at the L3-L4 level using a 22 gauge needle with return
of clear CSF.  15 cc of Omnipaque 180 was injected into the
subarachnoid space .
CLINICAL DATA: Low back pain.  Left leg pain.
TECHNIQUE: Multidetector CT imaging of the lumbar spine was
performed following myelography.  Multiplanar CT image
reconstructions were also generated.

[view not recorded (1 of 4)]
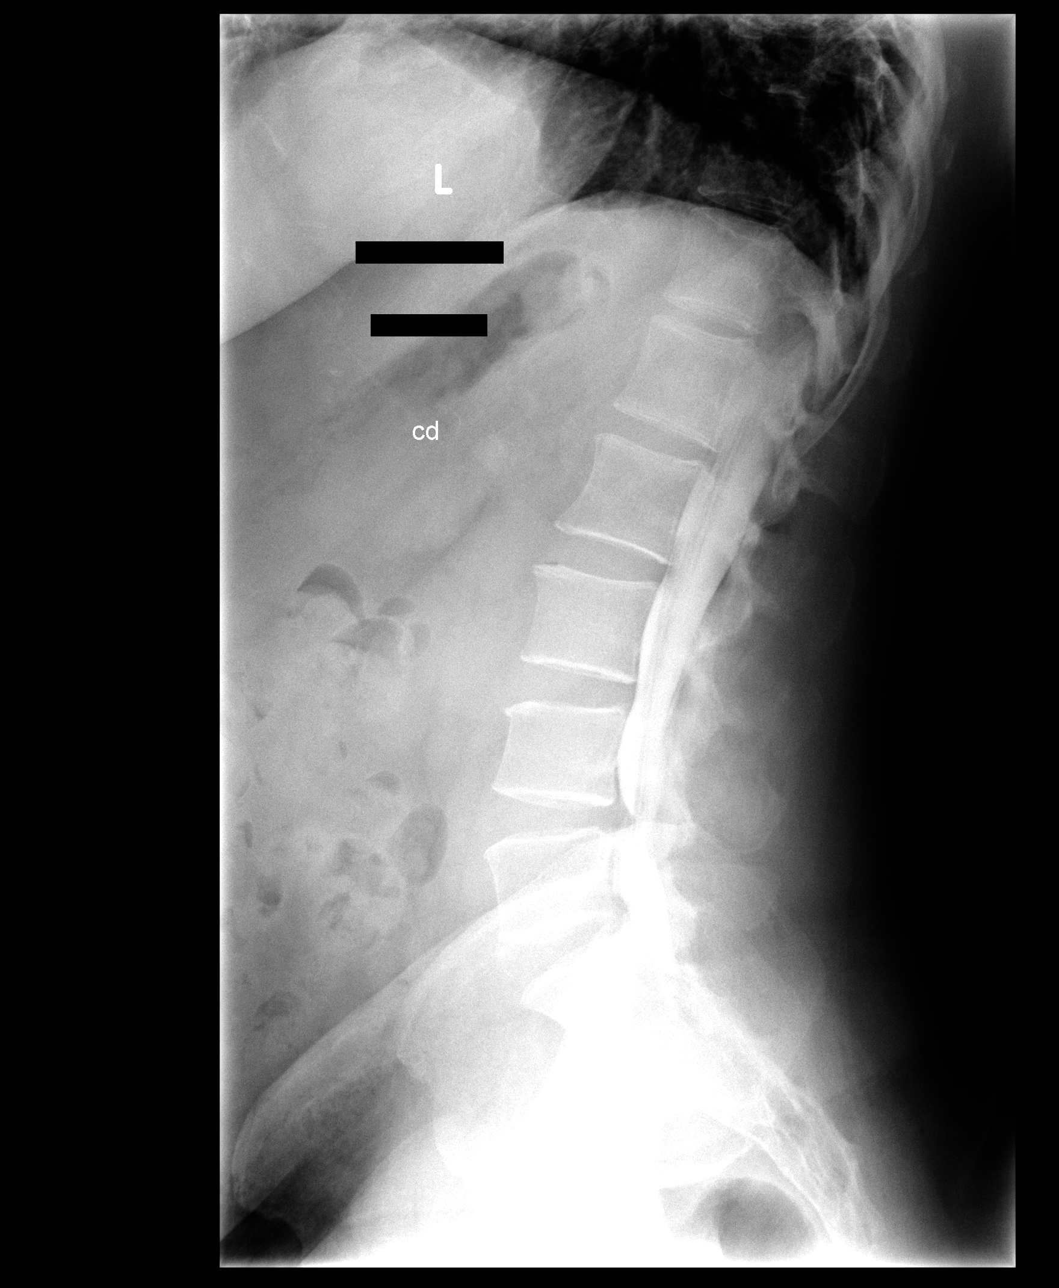

[view not recorded (2 of 4)]
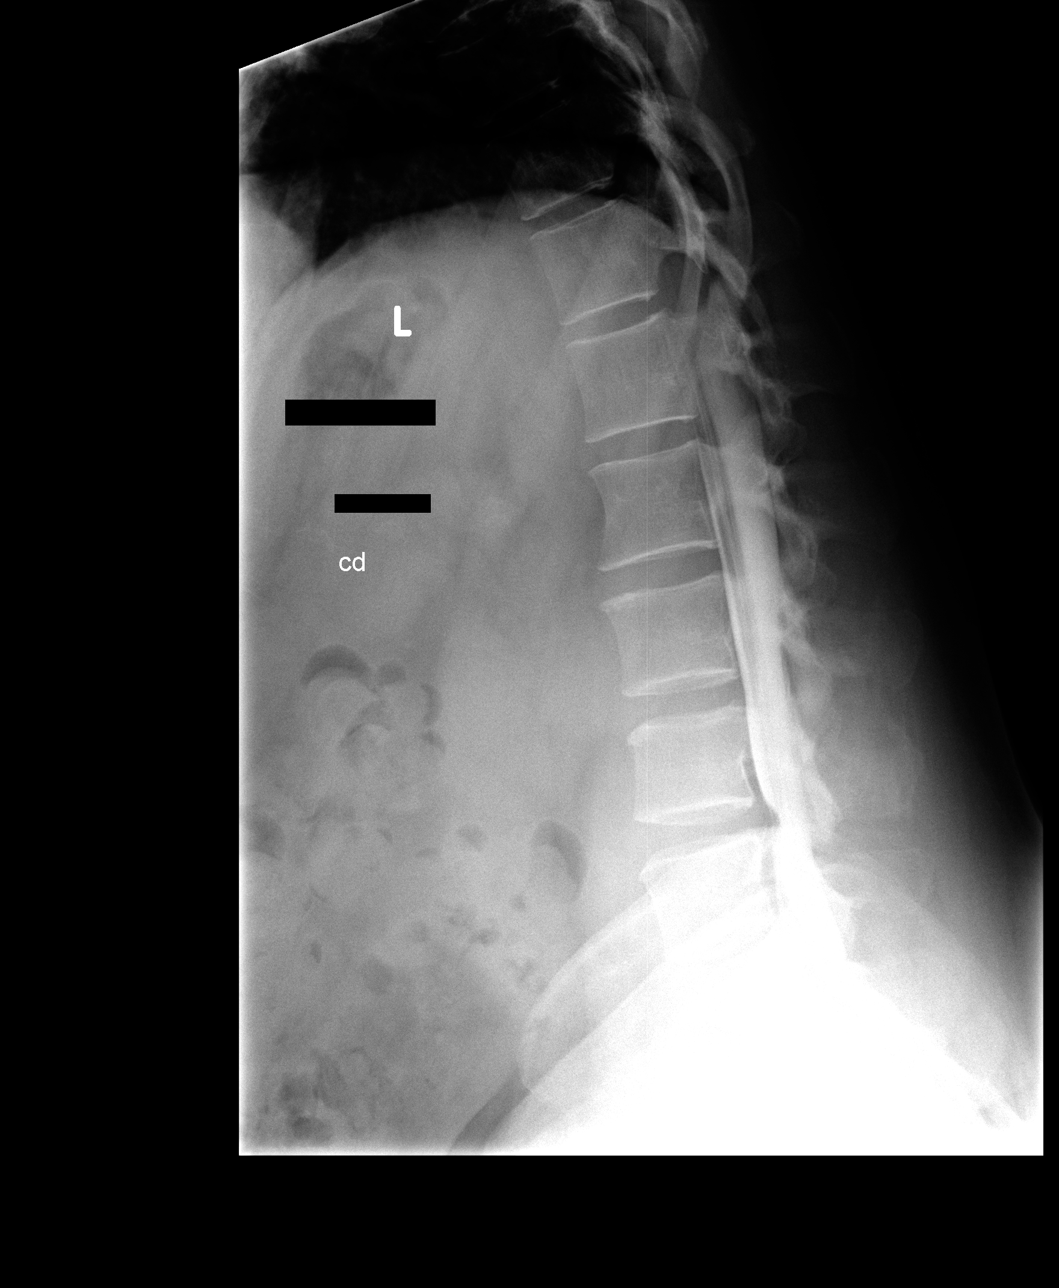

[view not recorded (3 of 4)]
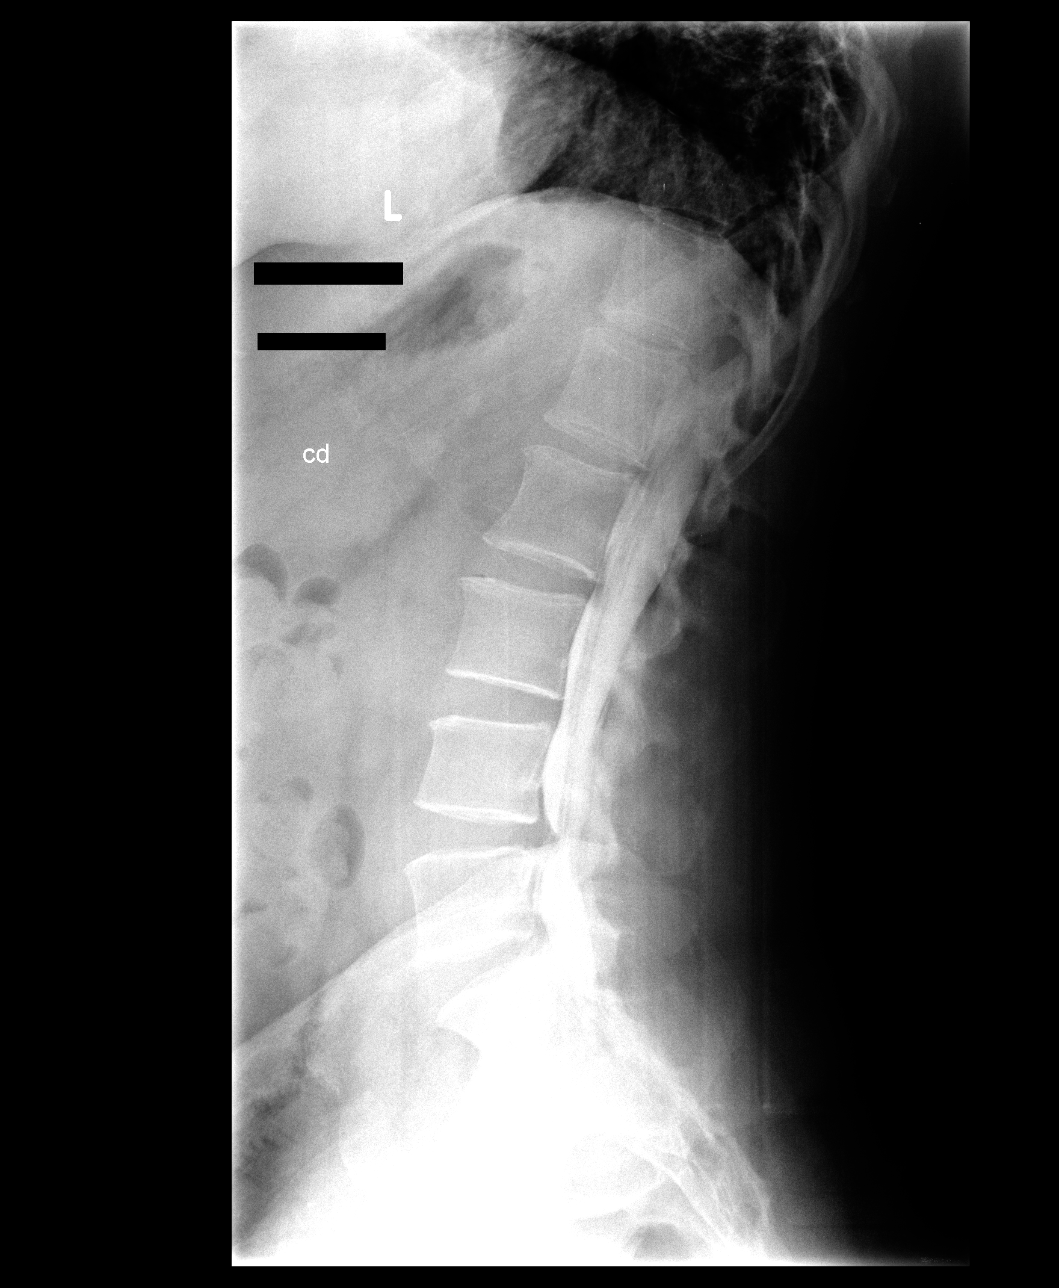

[view not recorded (4 of 4)]
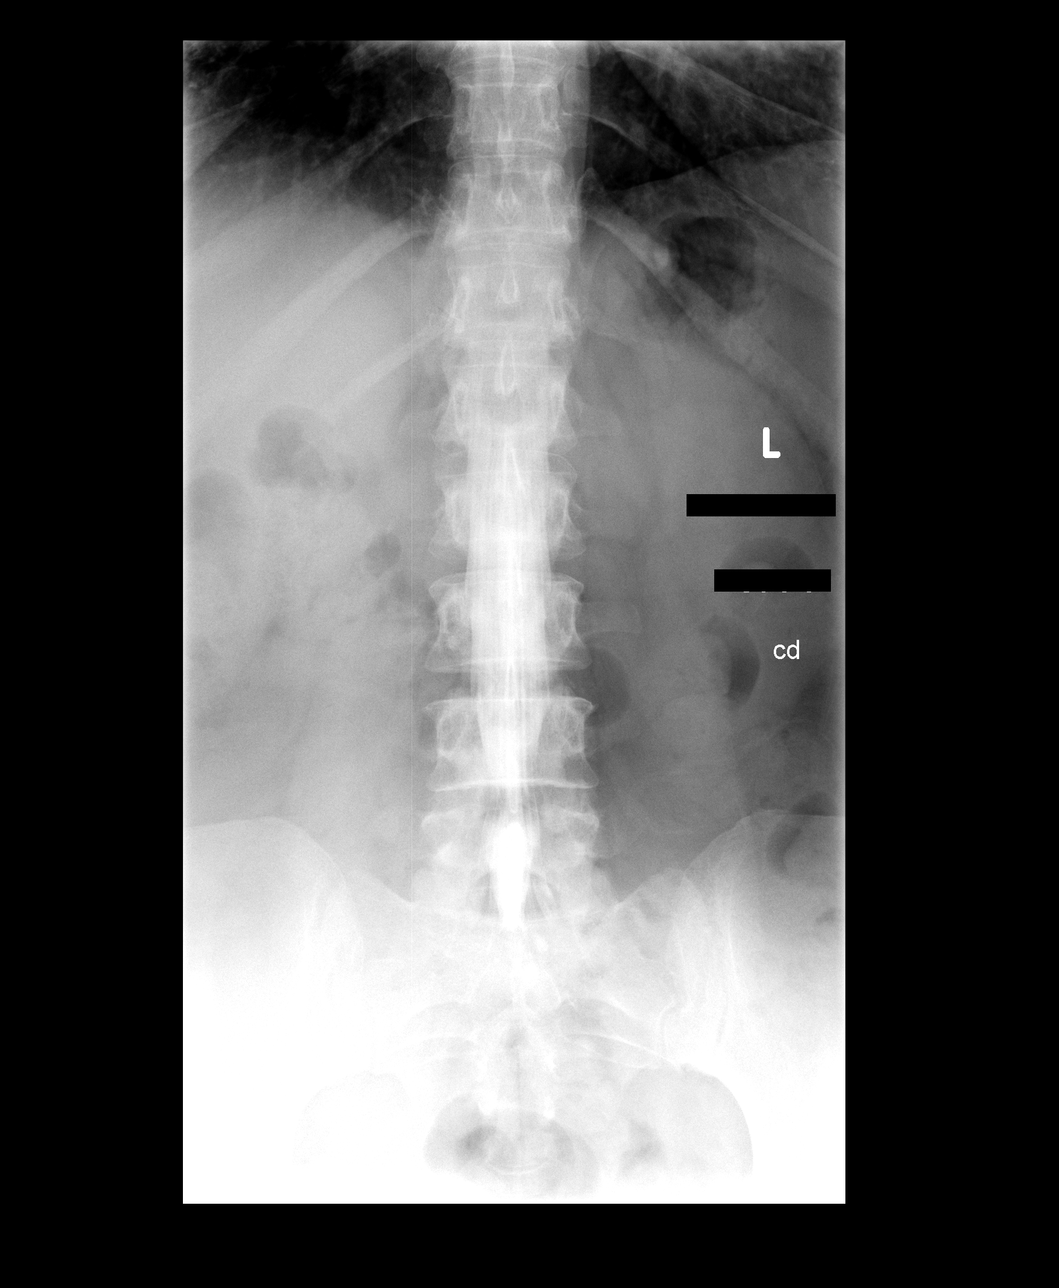

[4 of 4 positions shown; findings below may reference images not displayed]

IMPRESSION: Successful injection of  intrathecal contrast for myelography.

MYELOGRAM LUMBAR
FINDINGS: Good opacification lumbar subarachnoid space.  Mild
effacement both L5 nerve roots with a shallow ventral defect at L4-
L5.  With the patient upright, the protrusion increases
significantly in size.  There is significant increase in the
bilateral L5 nerve root encroachment and stenosis on the upright AP
view.

Fluoroscopy Time: 0.34 minutes
IMPRESSION: As above

CT MYELOGRAPHY LUMBAR SPINE
FINDINGS: No prevertebral or paraspinous masses. Conus normal.

L1-2: Normal.

L2-3: Normal.

L3-4: Normal.

L4-5: Central protrusion is slightly more eccentric to the right
than the left.  There is mild bilateral facet arthropathy.  Central
canal stenosis is accompanied by right greater than left L5 nerve
root encroachment.  No significant neural foraminal narrowing.

L5-S1: Normal.

Compared with prior MRI the appearance is fairly similar.
IMPRESSION: Central disc protrusion at L4-5 is much more apparent with the
patient upright.  There is mild bilateral facet arthropathy at this
level.

Note is made of the fact that the patient's left leg symptoms do
not completely correlate with the imaging appearance which shows a
right greater than left central protrusion.

## 2012-03-21 ENCOUNTER — Emergency Department (HOSPITAL_COMMUNITY)
Admission: EM | Admit: 2012-03-21 | Discharge: 2012-03-21 | Disposition: A | Discharge status: Home / Self Care | Payer: Medicaid Other | Attending: Emergency Medicine | Admitting: Emergency Medicine

## 2012-03-21 ENCOUNTER — Encounter (HOSPITAL_COMMUNITY): Payer: Self-pay | Admitting: Emergency Medicine

## 2012-03-21 DIAGNOSIS — M549 Dorsalgia, unspecified: Secondary | ICD-10-CM

## 2012-03-21 DIAGNOSIS — E119 Type 2 diabetes mellitus without complications: Secondary | ICD-10-CM | POA: Insufficient documentation

## 2012-03-21 DIAGNOSIS — Z79899 Other long term (current) drug therapy: Secondary | ICD-10-CM | POA: Insufficient documentation

## 2012-03-21 DIAGNOSIS — J449 Chronic obstructive pulmonary disease, unspecified: Secondary | ICD-10-CM | POA: Insufficient documentation

## 2012-03-21 DIAGNOSIS — F319 Bipolar disorder, unspecified: Secondary | ICD-10-CM | POA: Insufficient documentation

## 2012-03-21 DIAGNOSIS — J4489 Other specified chronic obstructive pulmonary disease: Secondary | ICD-10-CM | POA: Insufficient documentation

## 2012-03-21 DIAGNOSIS — G8929 Other chronic pain: Secondary | ICD-10-CM | POA: Insufficient documentation

## 2012-03-21 MED ORDER — OXYCODONE-ACETAMINOPHEN 5-325 MG PO TABS
2.0000 | ORAL_TABLET | Freq: Once | ORAL | Status: AC
  Administered 2012-03-21: 2 via ORAL
  Filled 2012-03-21: qty 2

## 2012-03-21 MED ORDER — ONDANSETRON 4 MG PO TBDP
4.0000 mg | ORAL_TABLET | Freq: Once | ORAL | Status: AC
  Administered 2012-03-21: 4 mg via ORAL
  Filled 2012-03-21: qty 1

## 2012-03-21 NOTE — Discharge Instructions (Signed)
Chronic Back Pain When back pain lasts longer than 3 months, it is called chronic back pain.This pain can be frustrating, but the cause of the pain is rarely dangerous.People with chronic back pain often go through certain periods that are more intense (flare-ups). CAUSES Chronic back pain can be caused by wear and tear (degeneration) on different structures in your back. These structures may include bones, ligaments, or discs. This degeneration may result in more pressure being placed on the nerves that travel to your legs and feet. This can lead to pain traveling from the low back down the back of the legs. When pain lasts longer than 3 months, it is not unusual for people to experience anxiety or depression. Anxiety and depression can also contribute to low back pain. TREATMENT  Establish a regular exercise plan. This is critical to improving your functional level.   Have a self-management plan for when you flare-up. Flare-ups rarely require a medical visit. Regular exercise will help reduce the intensity and frequency of your flare-ups.   Manage how you feel about your back pain and the rest of your life. Anxiety, depression, and feeling that you cannot alter your back pain have been shown to make back pain more intense and debilitating.   Medicines should never be your only treatment. They should be used along with other treatments to help you return to a more active lifestyle.   Procedures such as injections or surgery may be helpful but are rarely necessary. You may be able to get the same results with physical therapy or chiropractic care.  HOME CARE INSTRUCTIONS  Avoid bending, heavy lifting, prolonged sitting, and activities which make the problem worse.   Continue normal activity as much as possible.   Take brief periods of rest throughout the day to reduce your pain during flare-ups.   Follow your back exercise rehabilitation program. This can help reduce symptoms and prevent  more pain.   Only take over-the-counter or prescription medicines as directed by your caregiver. Muscle relaxants are sometimes prescribed. Narcotic pain medicine is discouraged for long-term pain, since addiction is a possible outcome.   If you smoke, quit.   Eat healthy foods and maintain a recommended body weight.  SEEK IMMEDIATE MEDICAL CARE IF:   You have weakness or numbness in one of your legs or feet.   You have trouble controlling your bladder or bowels.   You develop nausea, vomiting, abdominal pain, shortness of breath, or fainting.  Document Released: 12/17/2004 Document Revised: 10/29/2011 Document Reviewed: 10/24/2011 ExitCare Patient Information 2012 [employer].   RESOURCE GUIDE  Dental Problems  Patients with Medicaid: [facility]                     [address]                                           Phone:  [phone]                                                  If unable to pay or uninsured, contact:  [facility] or [facility] to become qualified for the adult dental clinic.  Chronic Pain Problems Contact [facility]  [phone] Patients need to be referred by their primary care doctor.  Insufficient Money for Medicine Contact [employer]:  call "211" or [facility] 271-5999.  No Primary Care Doctor Call [facility]  832-8000 Other agencies that provide inexpensive medical care    [facility]  832-8035    [facility]  832-7272    [facility]  [phone]    [facility]  832-4777    [facility]  [phone]    [facility]  [phone]  Substance Abuse Resources [facility]  336-882-2125 [facility] 336-784-9470 [facility] 336-285-9073 [facility] 336-845-3988 Residential & Outpatient Substance Abuse Program  800-659-3381  Psychological Services [facility]   832-9600 [facility]  [phone] [facility]   800 853-5163 (emergency services 641-4993)  Abuse/Neglect [place] Child Abuse Hotline (336) 641-3795 [place] [facility] Abuse Hotline 800-378-5315 (After Hours)  Emergency Shelter [facility] (336) 271-5985  [facility] (336) 275-9566 [facility] (704) 372-4663  MRSA Hotline #:   832-7006    [place] Resources  [facility] [place]  [employer]                           [facility] [address] [place]                     [address]         [address]  [place]                                               [place]                              [place] Phone:  [phone]                                  Phone:  [phone]                   Phone:  [phone]  [facility] Phone:  [phone]  [place] Child Abuse Hotline (336) 342-1394 (336) 342-3537 (After Hours) 

## 2012-03-21 NOTE — ED Notes (Signed)
Pt states she has hx of herniated disc and has hx of sciatica  Pt states she is having pain in her lower back with a shooting pain down her left leg  Pt states she is supposed to go to the pain clinic but is unsure of when as her dr office had to refax all the paperwork to them today  Pt states the pain started like this last night

## 2012-03-21 NOTE — ED Provider Notes (Signed)
History     CSN: [id number]  Arrival date & time 03/21/12  1958   First MD Initiated Contact with Patient 03/21/12 2219      Chief Complaint  Patient presents with  . Back Pain    (Consider location/radiation/quality/duration/timing/severity/associated sxs/prior treatment) HPI  A 46 year old female with history of chronic back pain presents with a chief complaint of low back pain shooting down to her left leg.  Patient states for the past 3 days she has been having increased low back pain which radiates down to her leg. Pain is worse now that she doesn't have  her pain medication. She described pain as a sharp and throbbing sensation worsened with movement. She denies fever, urinary symptoms, incontinence, urinary retention, cauda equina symptoms, or rash. She denies any recent infectious, or precipitating factor. Patient states she is scheduled to be seen at the pain clinic, but today when she called the office they asked her primary care doctor to restart all of the paperwork. Patient also mentioned that her primary care Dr. does not prescribe pain medication. She is currently taking Tylenol and ibuprofen at home for pain, which provide some relief.   Past Medical History  Diagnosis Date  . Migraine headache   . On home O2   . COPD (chronic obstructive pulmonary disease)   . Bipolar disorder   . Chronic back pain   . Hyperglycemia, drug-induced     steroid induced hyperglycemia  . ARDS (adult respiratory distress syndrome)     Jan 2011  . Diabetes mellitus   . Pneumonia   . Angina   . Asthma   . Shortness of breath   . Recurrent upper respiratory infection (URI)     Past Surgical History  Procedure Date  . Tubal ligation   . Uterine ablasion   . C-section   . Tracheostomy     decannulated 12/2009    Family History  Problem Relation Age of Onset  . Coronary artery disease Brother   . Diabetes Other   . Cancer Other   . Hypertension Other     History  Substance  Use Topics  . Smoking status: Former Smoker -- 1.5 packs/day for 33 years    Types: Cigarettes    Quit date: 07/30/2011  . Smokeless tobacco: Not on file   Comment: smoking 1-2 cigarettes every other day  . Alcohol Use: No    OB History    Grav Para Term Preterm Abortions TAB SAB Ect Mult Living                  Review of Systems  All other systems reviewed and are negative.    Allergies  Penicillins and Morphine  Home Medications   Current Outpatient Rx  Name Route Sig Dispense Refill  . ALBUTEROL SULFATE (2.5 MG/3ML) 0.083% IN NEBU Nebulization Take 2.5 mg by nebulization every 4 (four) hours as needed. For shortness of breath     . CHOLINE FENOFIBRATE 135 MG PO CPDR Oral Take 135 mg by mouth daily.      [name] FLUTICASONE-SALMETEROL 250-50 MCG/DOSE IN AEPB Inhalation Inhale 1 puff into the lungs every 12 (twelve) hours.      . FUROSEMIDE 20 MG PO TABS Oral Take 20 mg by mouth daily.      . GLYBURIDE-METFORMIN 5-500 MG PO TABS Oral Take 1 tablet by mouth 2 (two) times daily with a meal. When sugar is high.    . IBUPROFEN 200 MG PO TABS Oral Take  800 mg by mouth every 6 (six) hours as needed. Pain    . NYSTATIN 100000 UNIT/ML MT SUSP Oral Take 500,000 Units by mouth 4 (four) times daily as needed. Thrust    . OXYCODONE-ACETAMINOPHEN 10-325 MG PO TABS Oral Take 1 tablet by mouth every 4 (four) hours as needed. For pain 30 tablet 0  . POTASSIUM CHLORIDE CRYS ER 20 MEQ PO TBCR Oral Take 20 mEq by mouth 2 (two) times daily.      [name] TIZANIDINE HCL 4 MG PO TABS Oral Take 1 tablet (4 mg total) by mouth 3 (three) times daily. 30 tablet 0  . VENTOLIN HFA 108 (90 BASE) MCG/ACT IN AERS  INHALE 2 PUFFS INTO THE LUNGS EVERY 6 (SIX) HOURS AS NEEDED. 18 g 1    BP 125/82  Pulse 92  Temp(Src) 98.5 F (36.9 C) (Oral)  Resp 20  SpO2 100%  Physical Exam  Nursing note and vitals reviewed. Constitutional: She appears well-developed and well-nourished. No distress.       Awake, alert, nontoxic  appearance  HENT:  Head: Atraumatic.  Eyes: Conjunctivae are normal. Right eye exhibits no discharge. Left eye exhibits no discharge.  Neck: Neck supple.  Cardiovascular: Normal rate and regular rhythm.   Pulmonary/Chest: Effort normal. No respiratory distress. She exhibits no tenderness.  Abdominal: Soft. There is no tenderness. There is no rebound.  Musculoskeletal: She exhibits no tenderness.       Thoracic back: Normal.       Lumbar back: She exhibits decreased range of motion and tenderness. She exhibits no bony tenderness, no swelling, no edema and no deformity.       ROM appears intact, no obvious focal weakness  Neurological: Gait normal.  Reflex Scores:      Patellar reflexes are 2+ on the right side and 2+ on the left side.      Mental status and motor strength appears intact. No footdrop noted, normal gait  Skin: No rash noted.  Psychiatric: She has a normal mood and affect.    ED Course  Procedures (including critical care time)  Labs Reviewed - No data to display No results found.   No diagnosis found.    MDM  Chronic pain.  Is scheduled to be treated by pain clinic.  Will give pain medication here.  Recommend further management by pain clinic.  Has no red flags finding, VSS, able to ambulate without difficulty.          Fayrene Helper, PA-C 03/21/12 2307

## 2012-03-22 NOTE — ED Provider Notes (Signed)
Medical screening examination/treatment/procedure(s) were performed by non-physician practitioner and as supervising physician I was immediately available for consultation/collaboration.   Dawnielle Christiana, MD 03/22/12 0009 

## 2012-03-23 ENCOUNTER — Other Ambulatory Visit (HOSPITAL_COMMUNITY): Payer: Self-pay | Admitting: Internal Medicine

## 2012-03-23 DIAGNOSIS — Z139 Encounter for screening, unspecified: Secondary | ICD-10-CM

## 2012-03-28 ENCOUNTER — Ambulatory Visit (HOSPITAL_COMMUNITY): Payer: Medicaid Other

## 2012-03-30 IMAGING — CR DG CHEST 2V
2 series · 2 of 2 positions shown · non-contrast
Comparison: December 22, 2010

CLINICAL DATA: Preoperative respiratory exam; lumbar herniated
disc; COPD

CHEST - 2 VIEW

[view not recorded (1 of 2)]
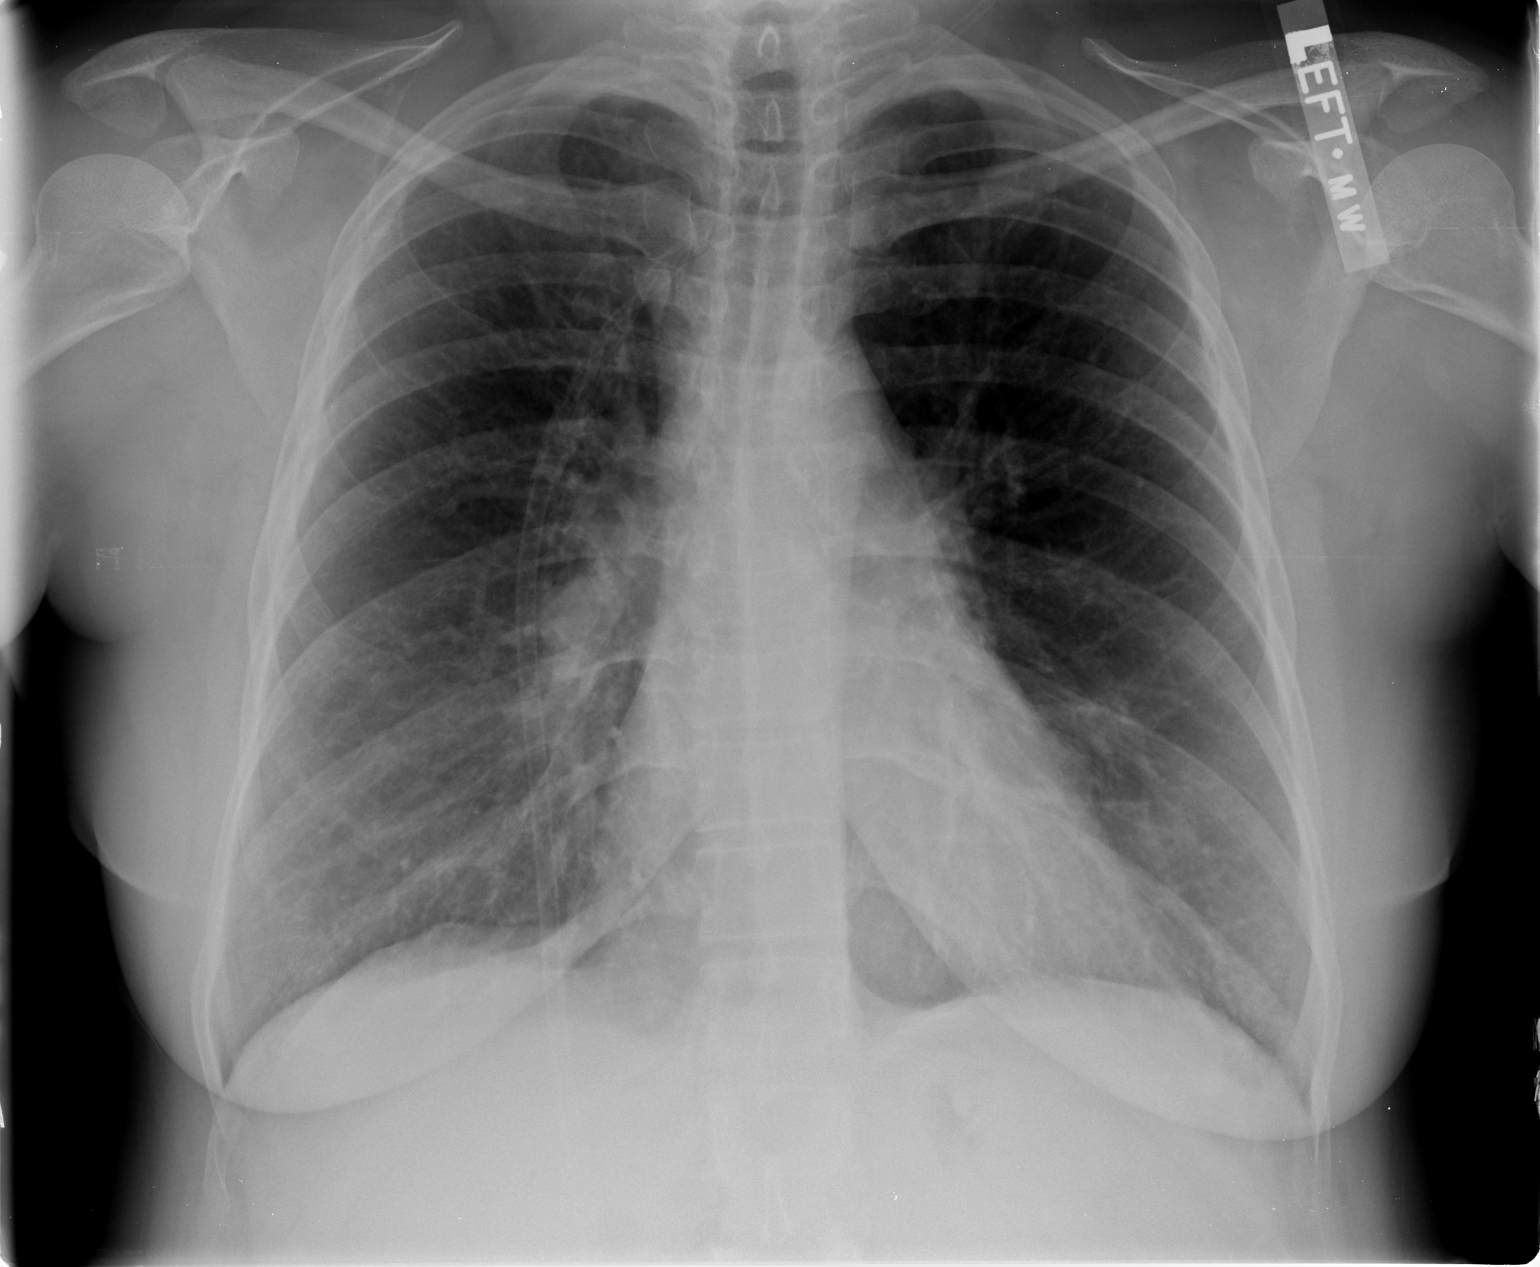

[view not recorded (2 of 2)]
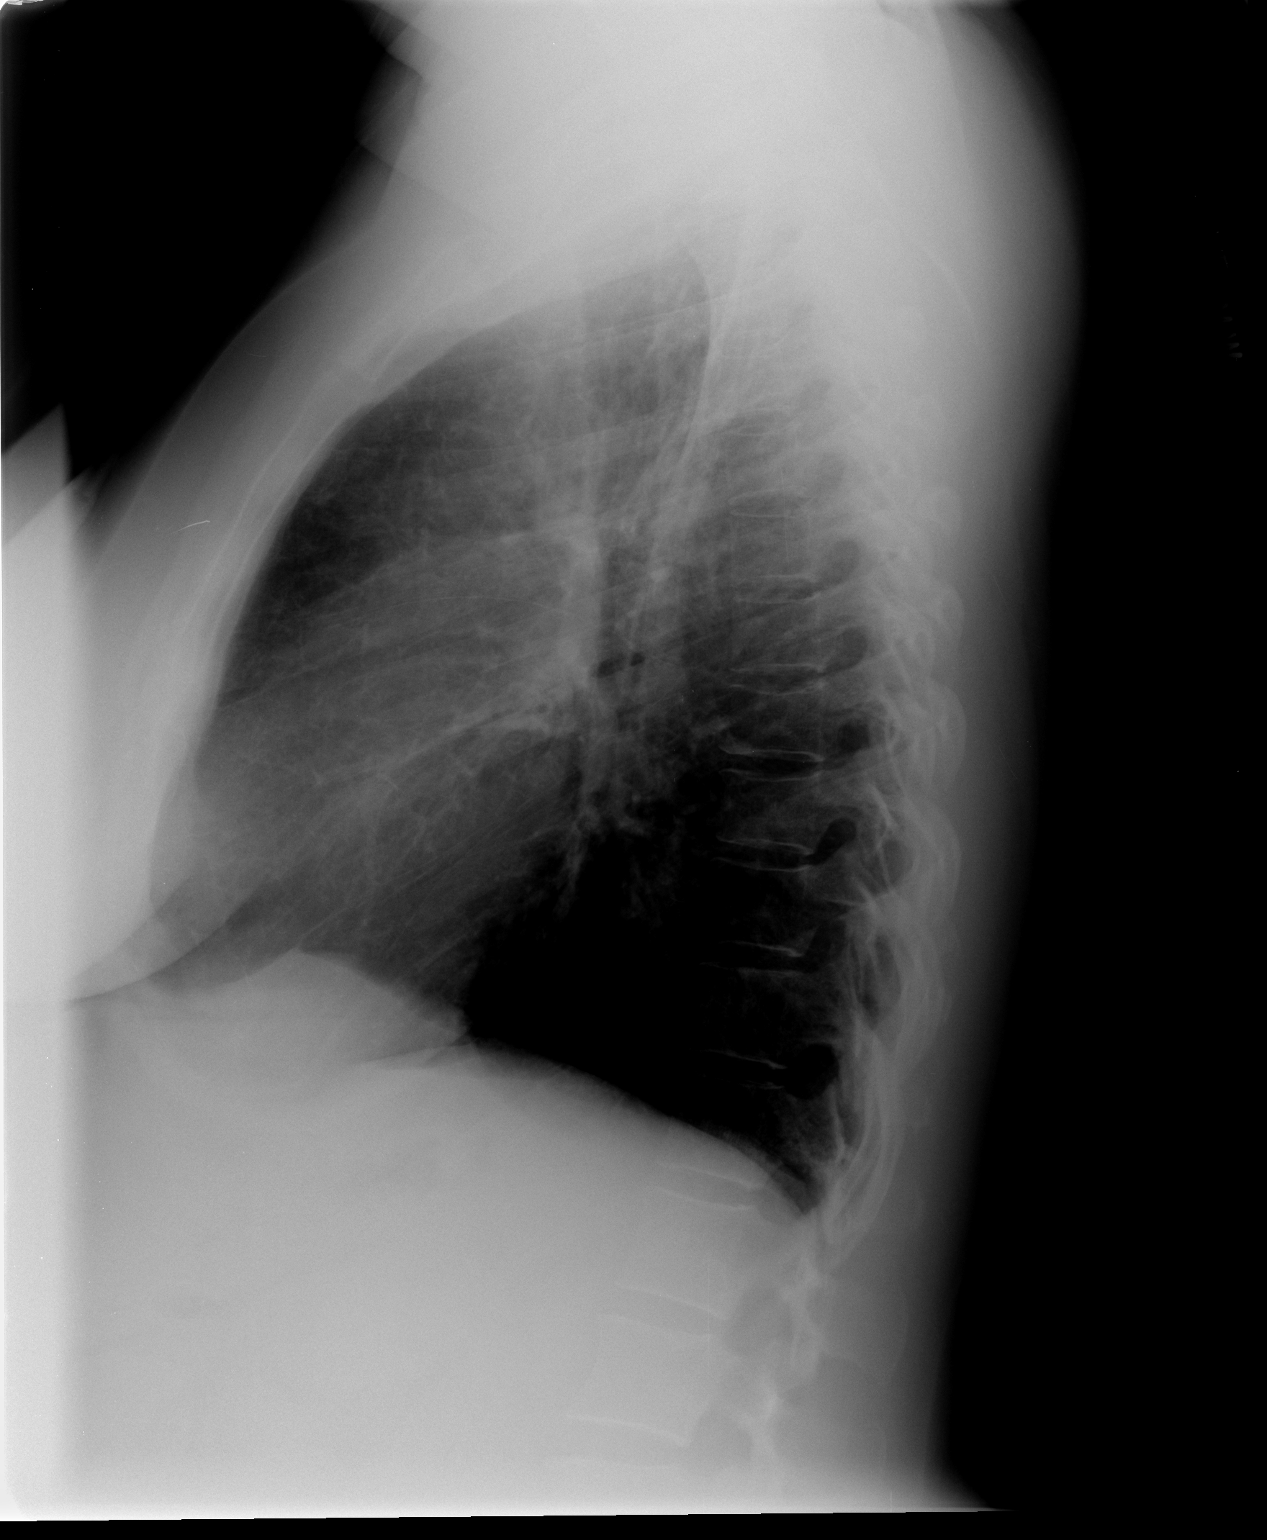

[2 of 2 positions shown; findings below may reference images not displayed]

FINDINGS: The cardiac silhouette, mediastinum, pulmonary
vasculature are within normal limits.  Both lungs are clear.
There is no acute bony abnormality.
IMPRESSION: There is no evidence of acute cardiac or pulmonary process.

## 2012-04-02 IMAGING — CR DG CHEST 1V PORT
1 series · 1 of 1 positions shown · non-contrast
Comparison: [DATE] earlier.

CLINICAL DATA: 44-year-old female with shortness of breath.
History of prior necrotizing pneumonia.

PORTABLE CHEST - 1 VIEW

[view not recorded]
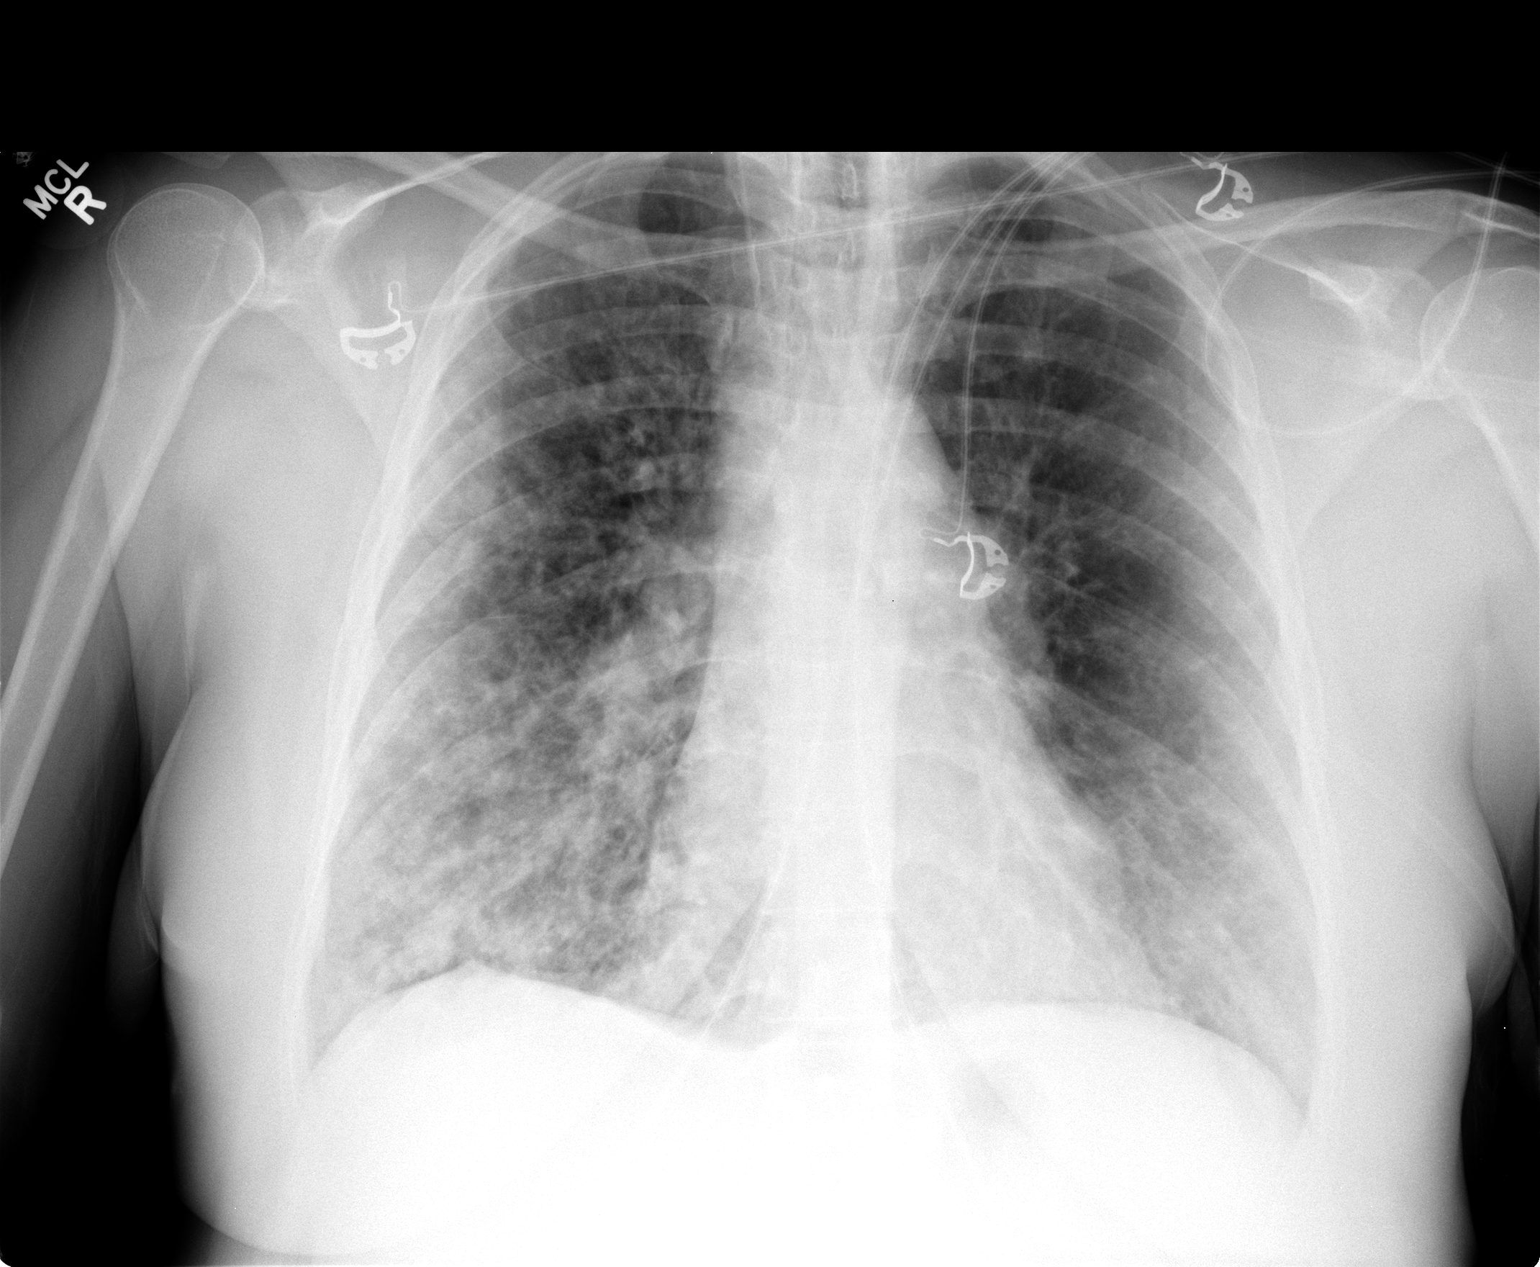

[1 of 1 positions shown; findings below may reference images not displayed]

FINDINGS: AP portable upright view 0910 hours.  Nodular airspace
disease is new right greater than left with a basilar predominance.
No pneumothorax.  No pleural effusion or pulmonary edema.  Cardiac
size and mediastinal contours are within normal limits.  Visualized
tracheal air column is within normal limits.
IMPRESSION: Right greater than left nodular airspace disease is new and has a
basilar predominant.  Favor acute viral / atypical pneumonia.

## 2012-04-02 IMAGING — CR DG CHEST 1V PORT
1 series · 1 of 1 positions shown · non-contrast
Comparison: 02/26/2011

CLINICAL DATA: Endotracheal tube placement.  Endotracheal tube tip
is

PORTABLE CHEST - 1 VIEW

[view not recorded]
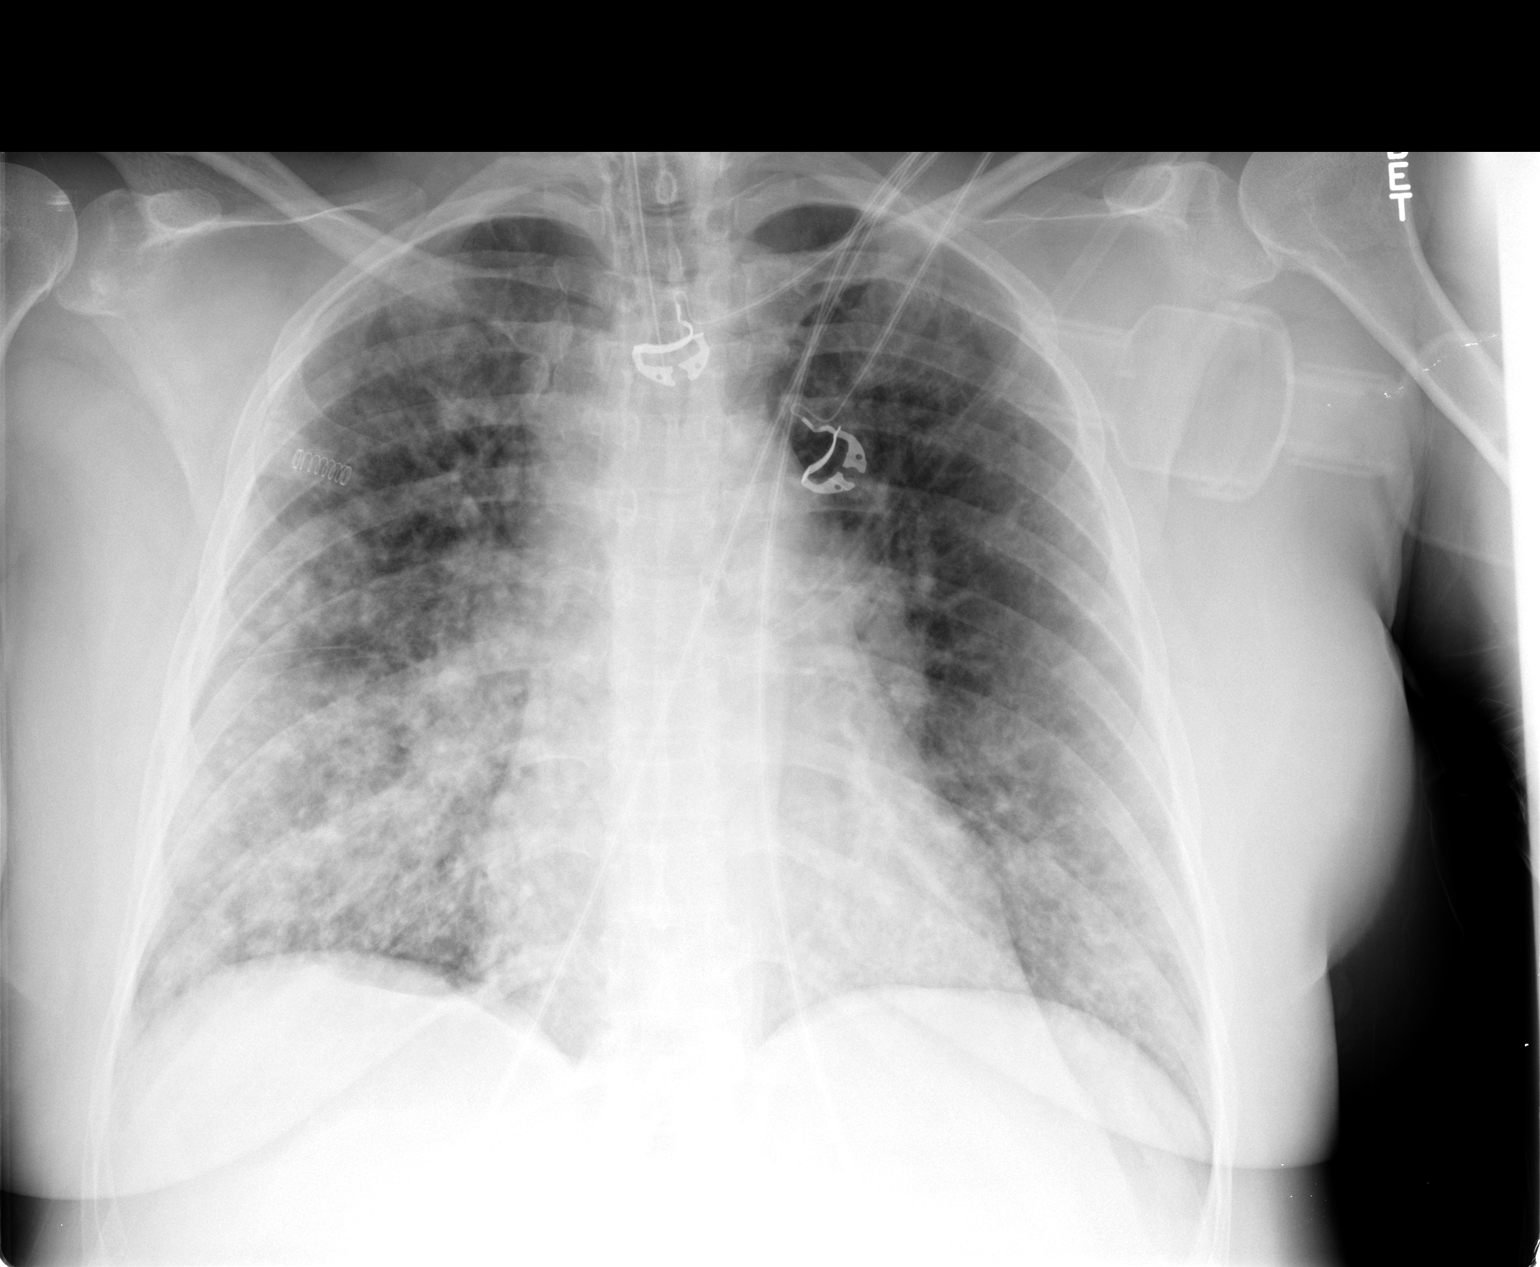

[1 of 1 positions shown; findings below may reference images not displayed]

FINDINGS: Endotracheal tube tip is 5.2 cm above the carina.

No significant change in asymmetric slightly nodular air space
disease. Given the fact that occurred in a short period of time,
this may represent asymmetric pulmonary edema rather than
infiltrate or tumor.

Heart size top normal.  Minimally tortuous aorta.
IMPRESSION: Endotracheal tube tip 5.2 cm above the carina.

Persistent asymmetric nodular air space disease as noted.

## 2012-04-02 IMAGING — CR DG CHEST 1V PORT
1 series · 1 of 1 positions shown · non-contrast
Comparison: 02/26/2011

CLINICAL DATA: Evaluate line placement

PORTABLE CHEST - 1 VIEW

[AP]
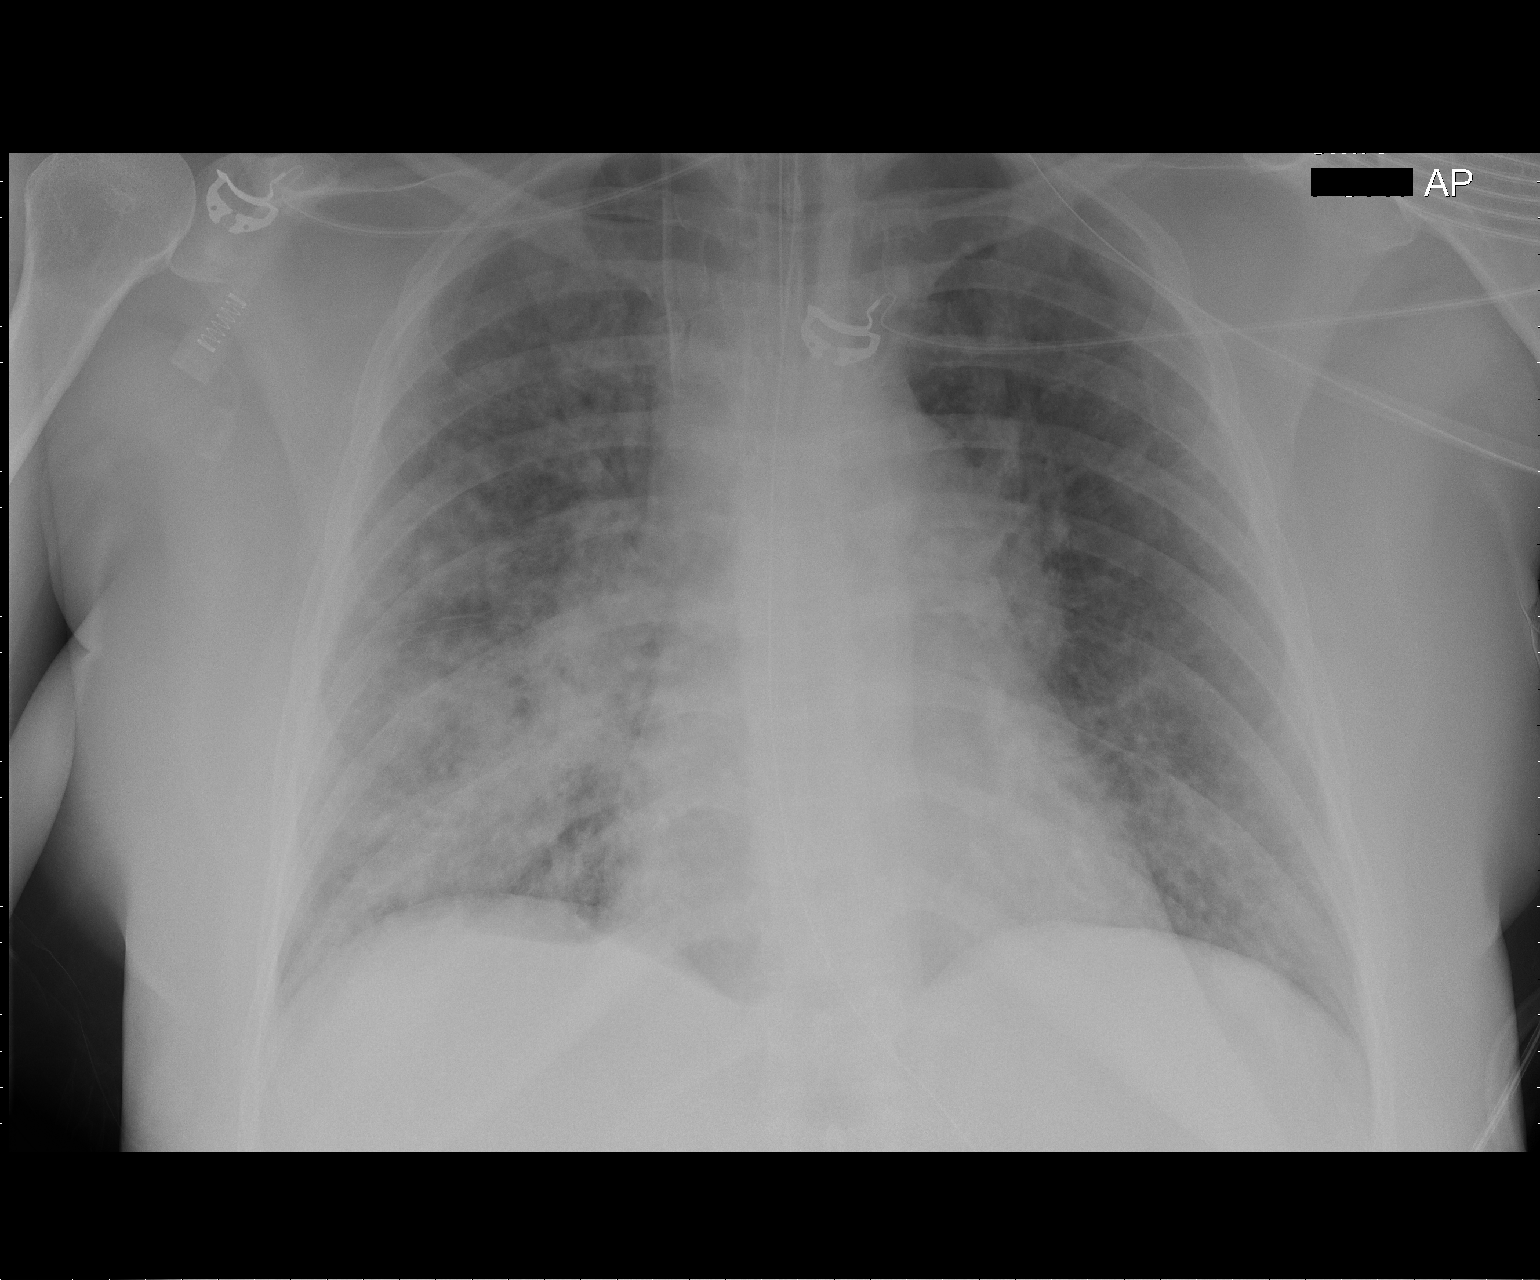

[1 of 1 positions shown; findings below may reference images not displayed]

FINDINGS: ET tube tip is above the carina.

There is a nasogastric tube with side port below GE junction.

Right IJ catheter tip is in the projection of the SVC.

There is no pneumothorax identified.  There is mild diffuse
pulmonary venous congestion.  Airspace consolidation within the
right base is unchanged.
IMPRESSION: 1.  Right IJ catheter tip is in the projection of the SVC
2.  No change in aeration to the right base.

## 2012-04-02 IMAGING — CR DG CHEST 1V PORT SAME DAY
1 series · 1 of 1 positions shown · non-contrast
Comparison: 02/26/2011

CLINICAL DATA: Endotracheal tube placement.

PORTABLE CHEST - 1 VIEW SAME DAY

[view not recorded]
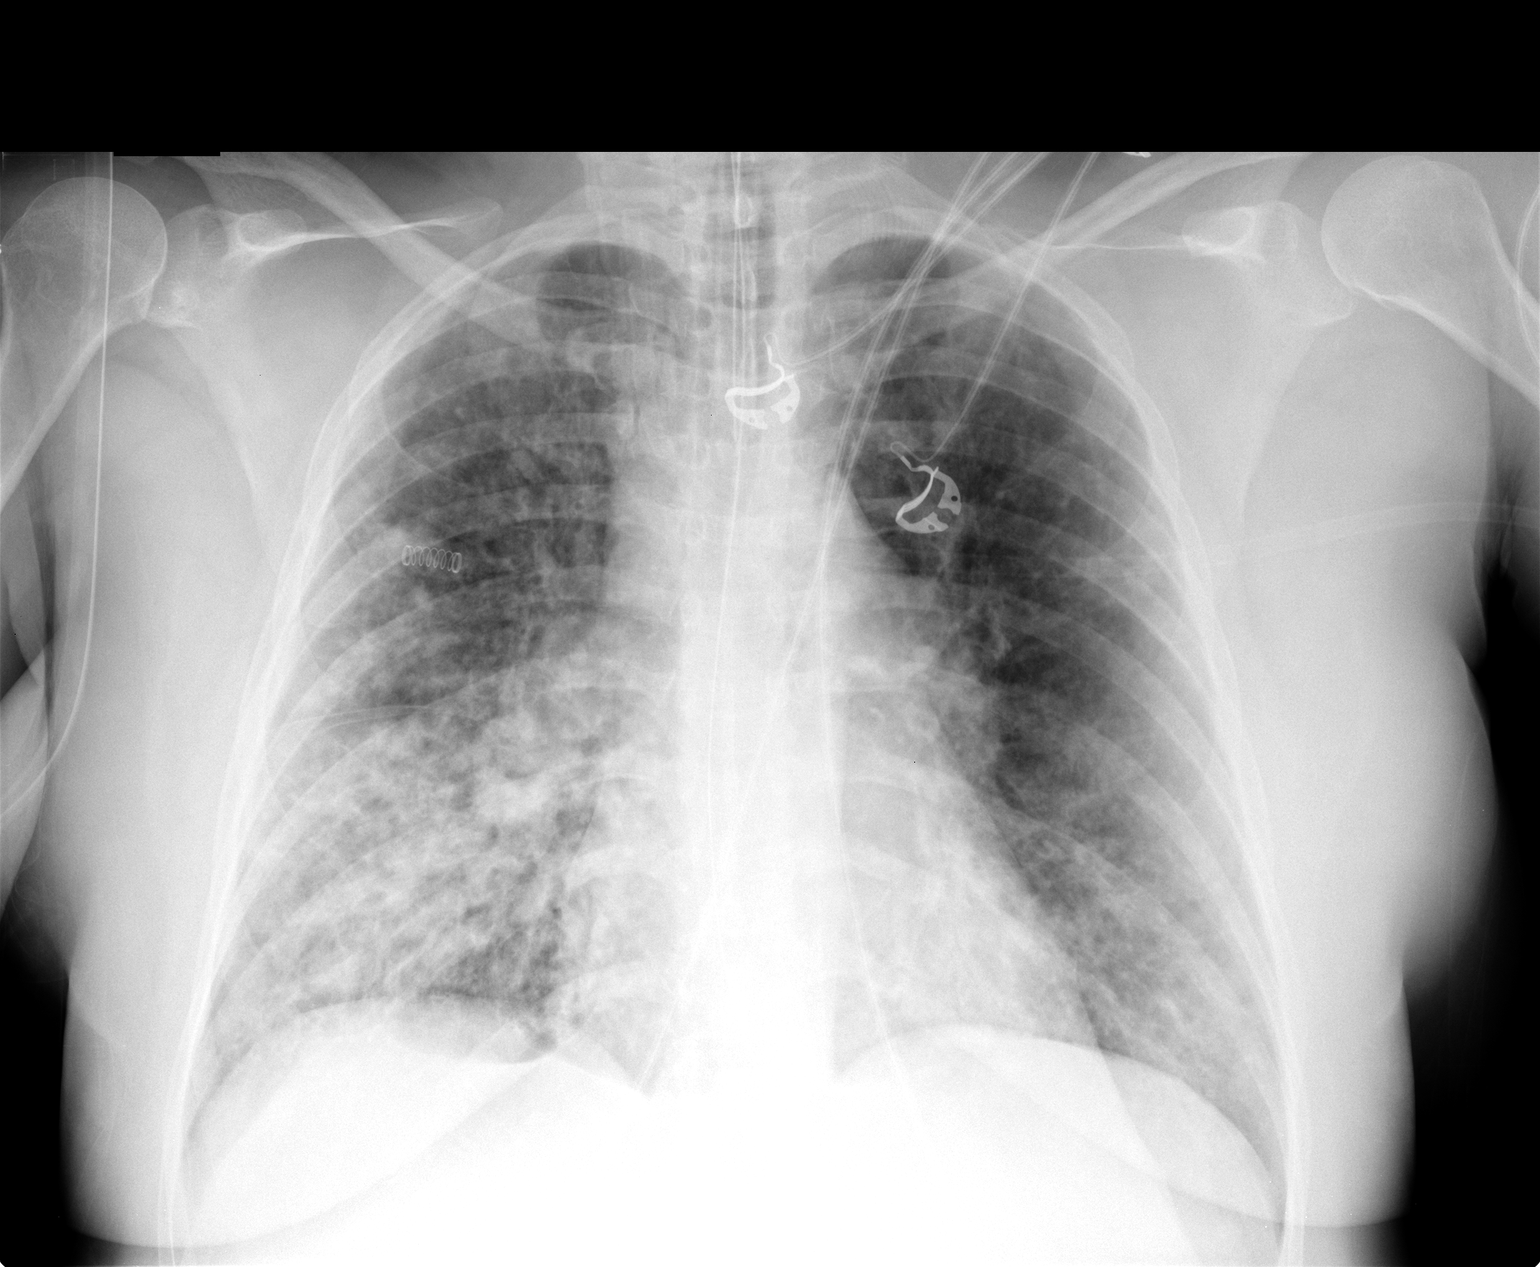

[1 of 1 positions shown; findings below may reference images not displayed]

FINDINGS: Portable exam is performed at 3565 hours.  Endotracheal
tube is in place with tip 4.9 cm above carina.  Nasogastric tube is
in place with tip off the radiographic field.  There are patchy
nodular opacities throughout the lungs bilaterally, right greater
than left.  Overall, the appearance is stable.  The heart is mildly
enlarged.
IMPRESSION: 1.  Interval placement nasogastric tube, tip off the radiographic
field.
2.  Persistent nodular air space filling opacities.

## 2012-04-02 IMAGING — CR DG CHEST 1V PORT
1 series · 1 of 1 positions shown · non-contrast
Comparison: 02/26/2011

CLINICAL DATA: Verify ET tube placement

PORTABLE CHEST - 1 VIEW

[AP]
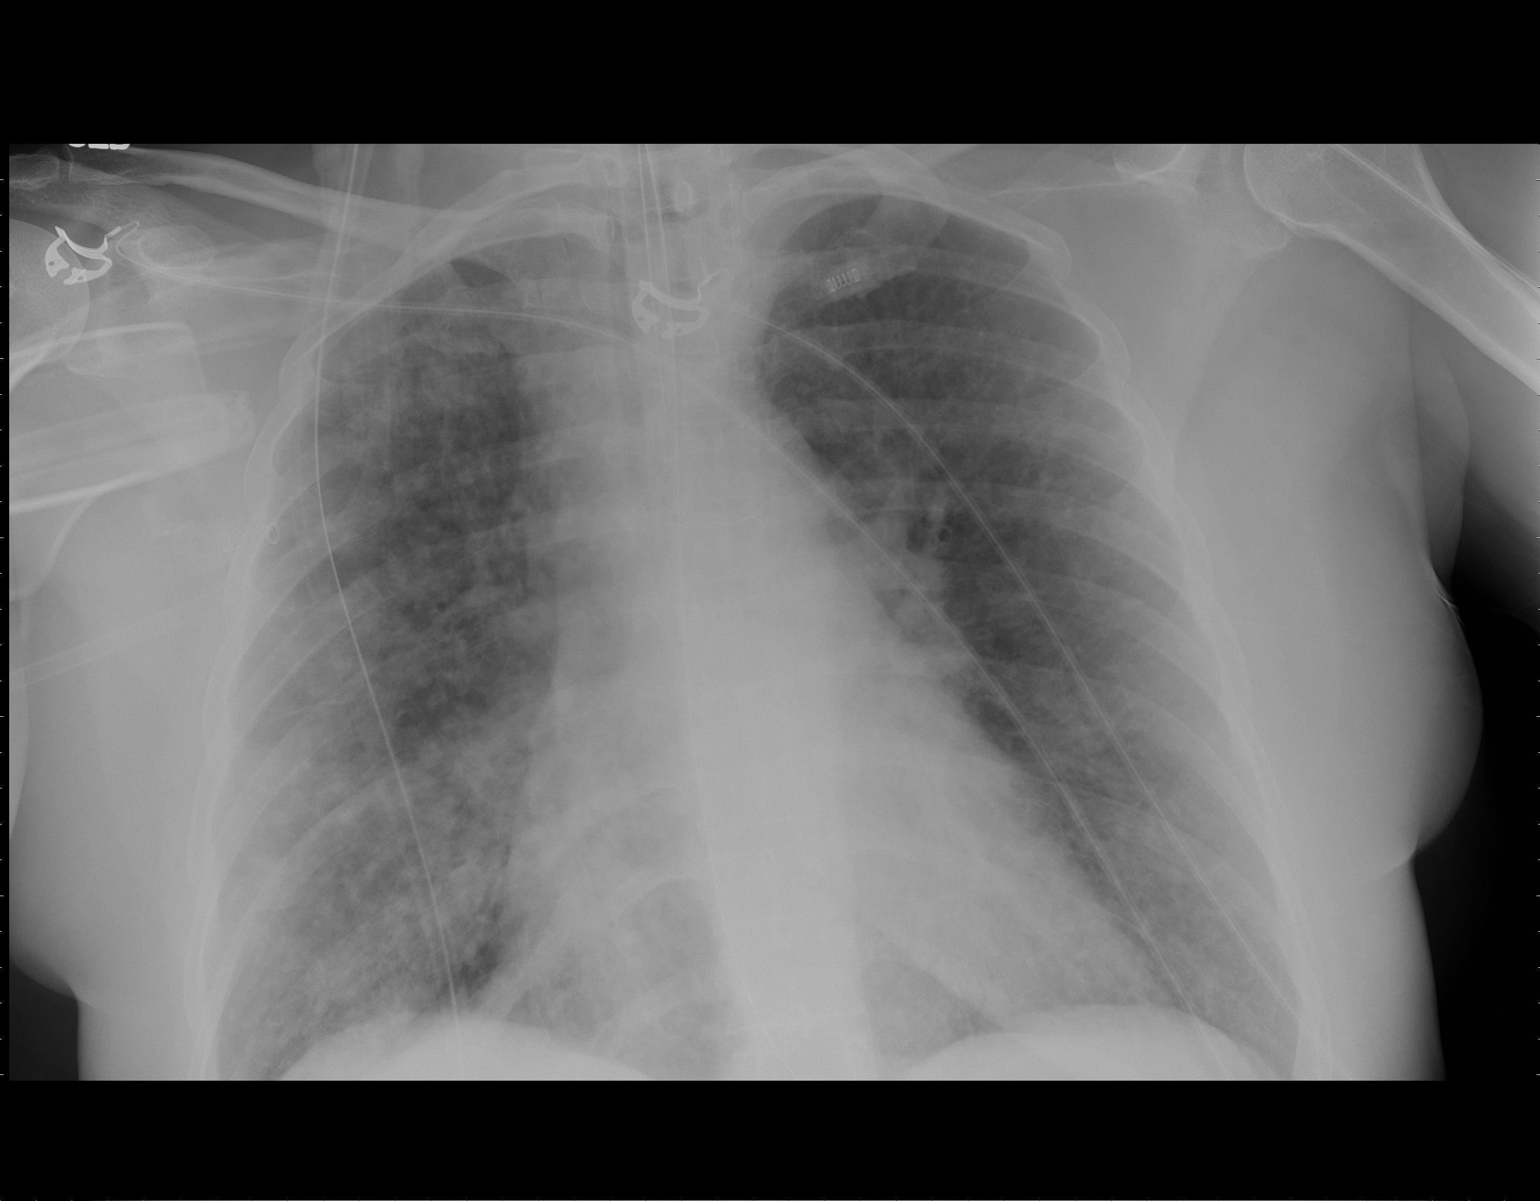

[1 of 1 positions shown; findings below may reference images not displayed]

FINDINGS: The ET tube tip is above the carina.

There is a nasogastric tube with tip below the GE junction.

Mild cardiac enlargement.

Bilateral nodular airspace opacities are again noted.

When compared with the prior exam there is slightly improved
aeration to the right lower lobe.
IMPRESSION: 1.  ET tube tip is in satisfactory position above the carina.
2.  Slight improved aeration to the right base.

## 2012-04-03 IMAGING — CR DG CHEST 1V PORT
1 series · 1 of 1 positions shown · non-contrast
Comparison: 02/26/2011.

CLINICAL DATA: Respiratory failure.

PORTABLE CHEST - 1 VIEW

[AP]
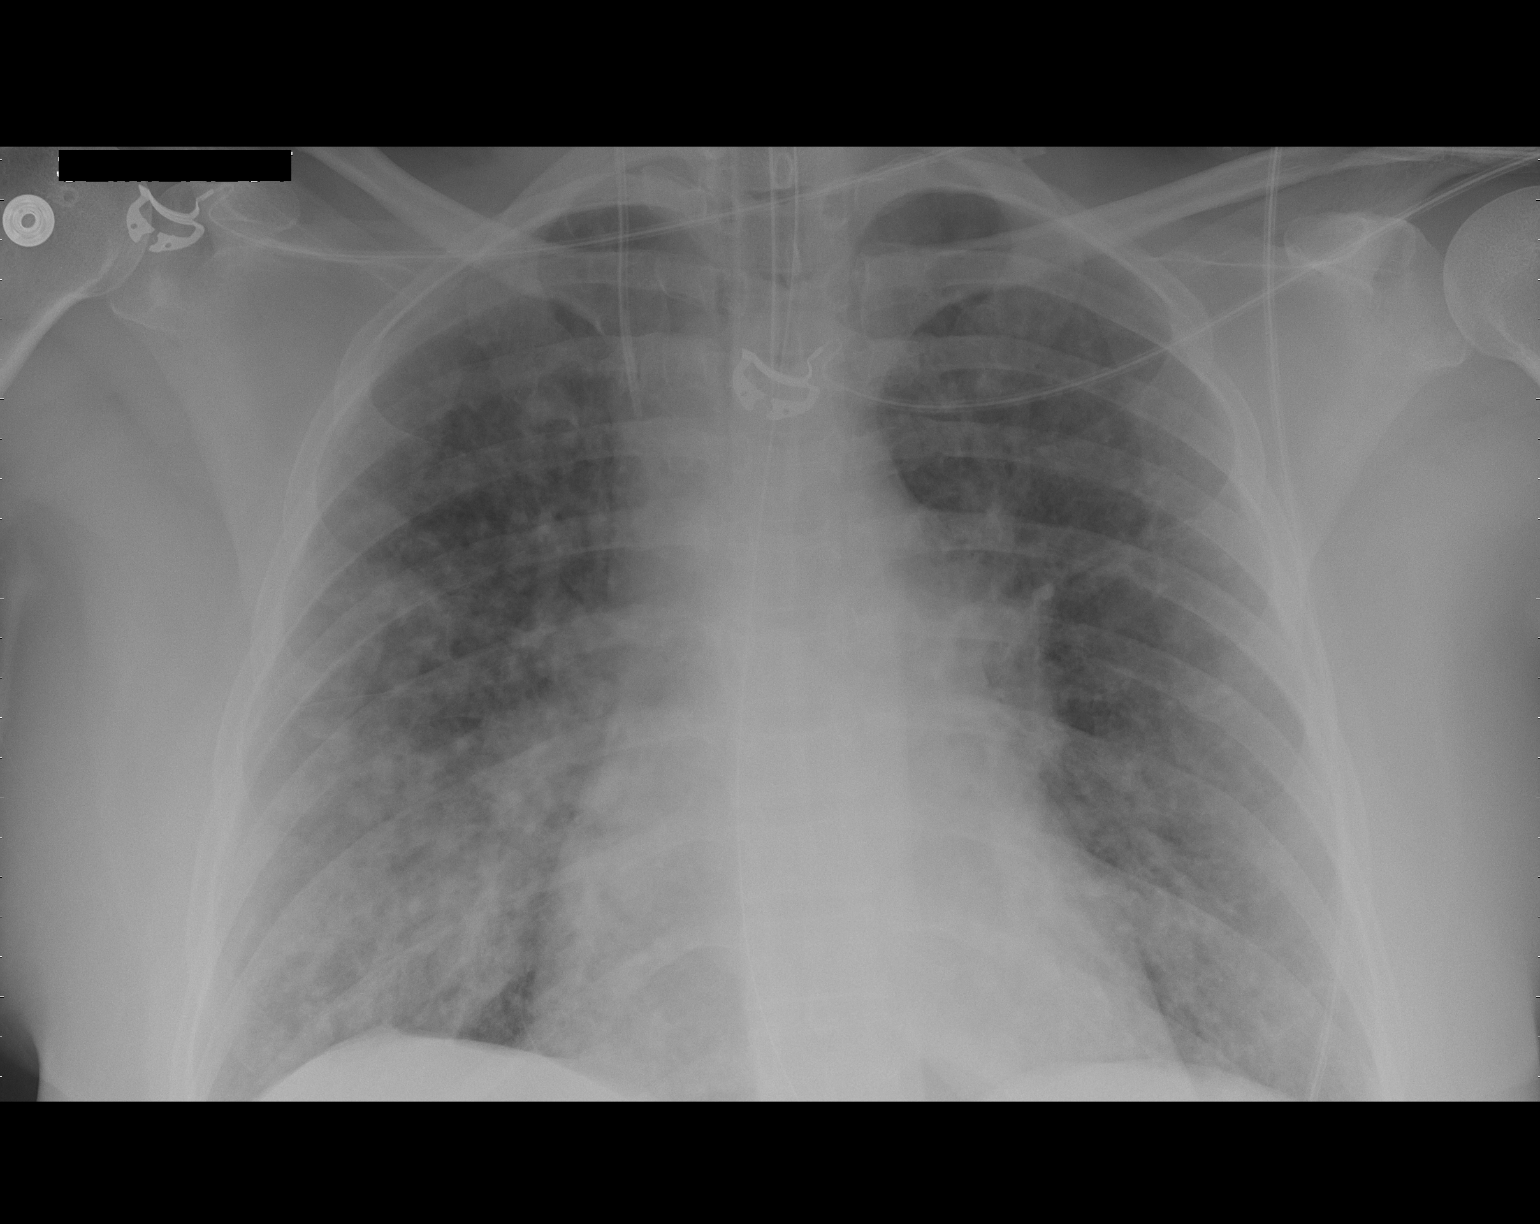

[1 of 1 positions shown; findings below may reference images not displayed]

FINDINGS: Right internal jugular catheter tip proximal superior
vena cava level.  No gross pneumothorax.

Endotracheal tube tip 5.4 cm above the carina. Nasogastric tube
courses below the diaphragm.  The tip is not included on this
exam.

Asymmetric air space disease most notable right lower lung zone.

Mediastinal and cardiac silhouette unchanged.
IMPRESSION: Similar appearance of asymmetric air space disease most notable
right lung base.  This may represent asymmetric pulmonary edema
although infectious infiltrate cannot be excluded.

## 2012-04-04 IMAGING — CR DG CHEST 1V PORT
1 series · 1 of 1 positions shown · non-contrast
Comparison: 9343 hours

CLINICAL DATA: Respiratory failure

PORTABLE CHEST - 1 VIEW

[AP]
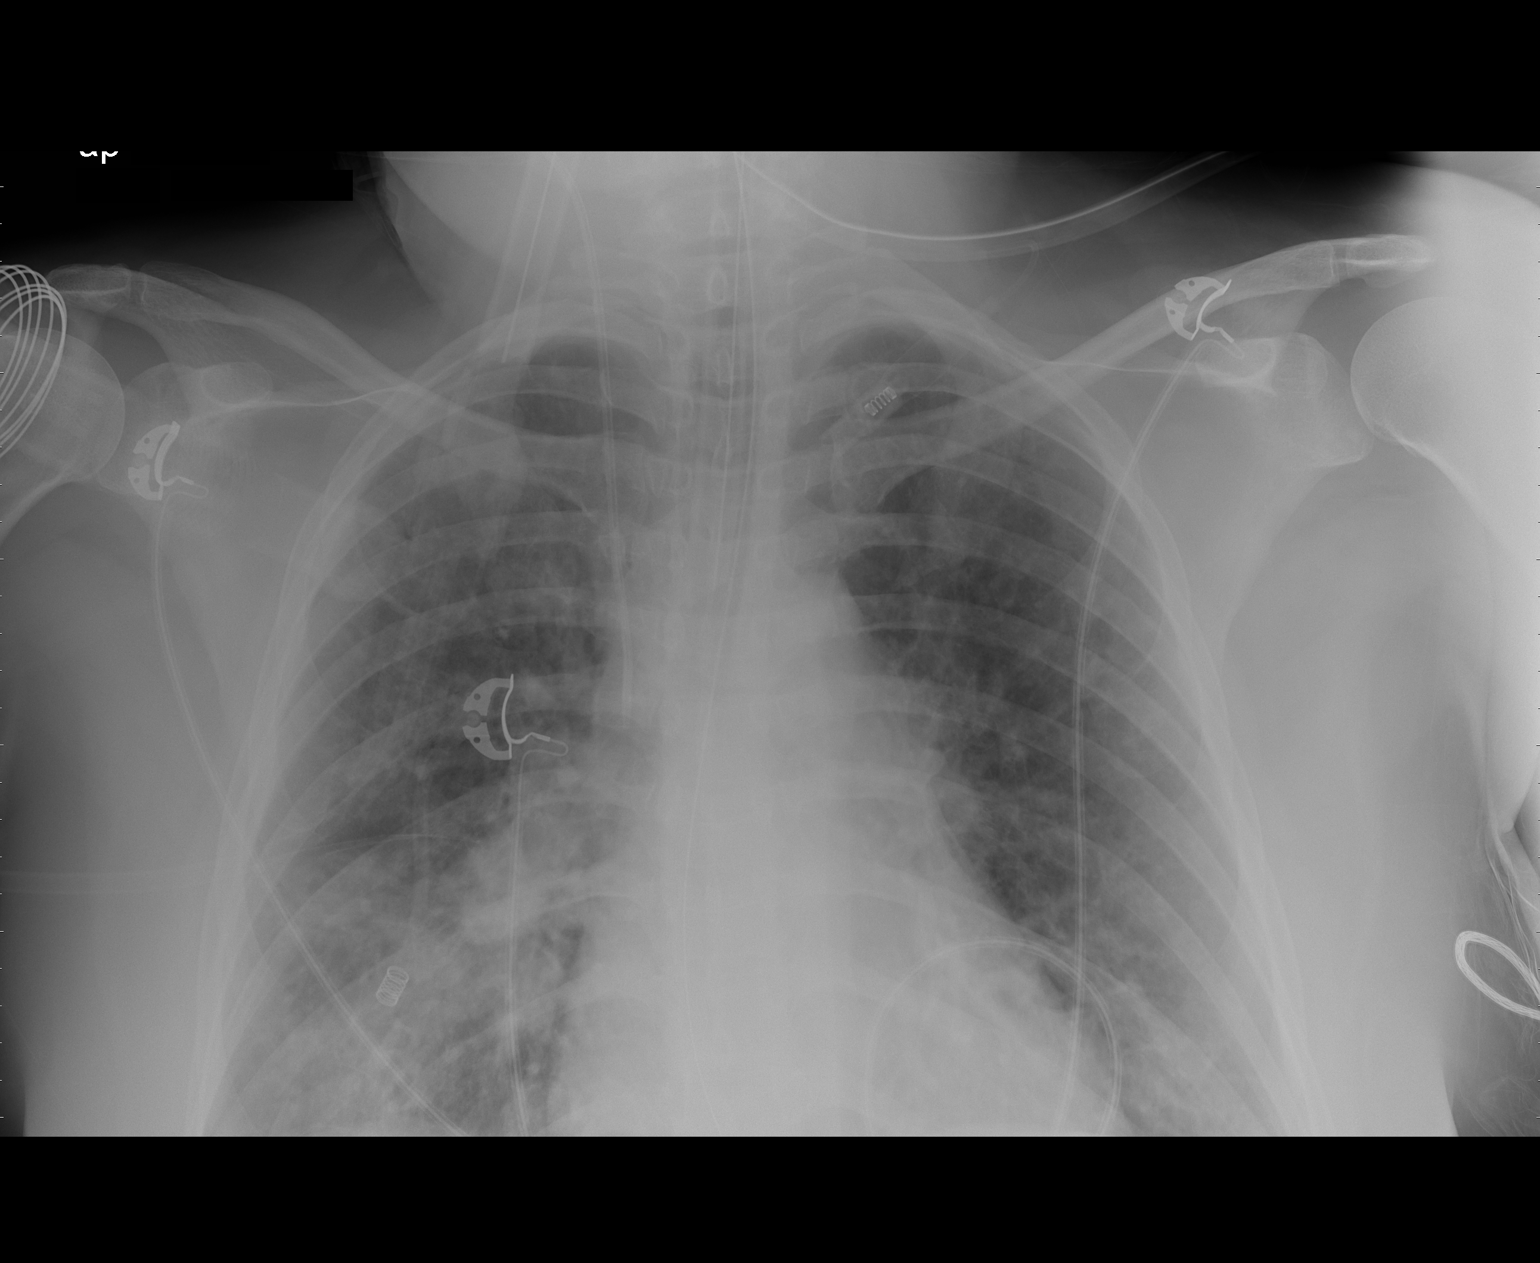

[1 of 1 positions shown; findings below may reference images not displayed]

FINDINGS: Film at 6113 hours shows endotracheal tube with tip
located approximately 2 cm above the carina.  Airspace disease of
the right lower lung is slightly more dense.  Overall lung volumes
are stable.  Central line positioning stable.
IMPRESSION: Endotracheal tube tip approximately 2 cm above the carina.  Right
lower lobe airspace disease is slightly more prominent.

## 2012-04-04 IMAGING — CR DG CHEST 1V PORT
1 series · 1 of 1 positions shown · non-contrast
Comparison: 02/27/2011

CLINICAL DATA: Respiratory distress

PORTABLE CHEST - 1 VIEW

[view not recorded]
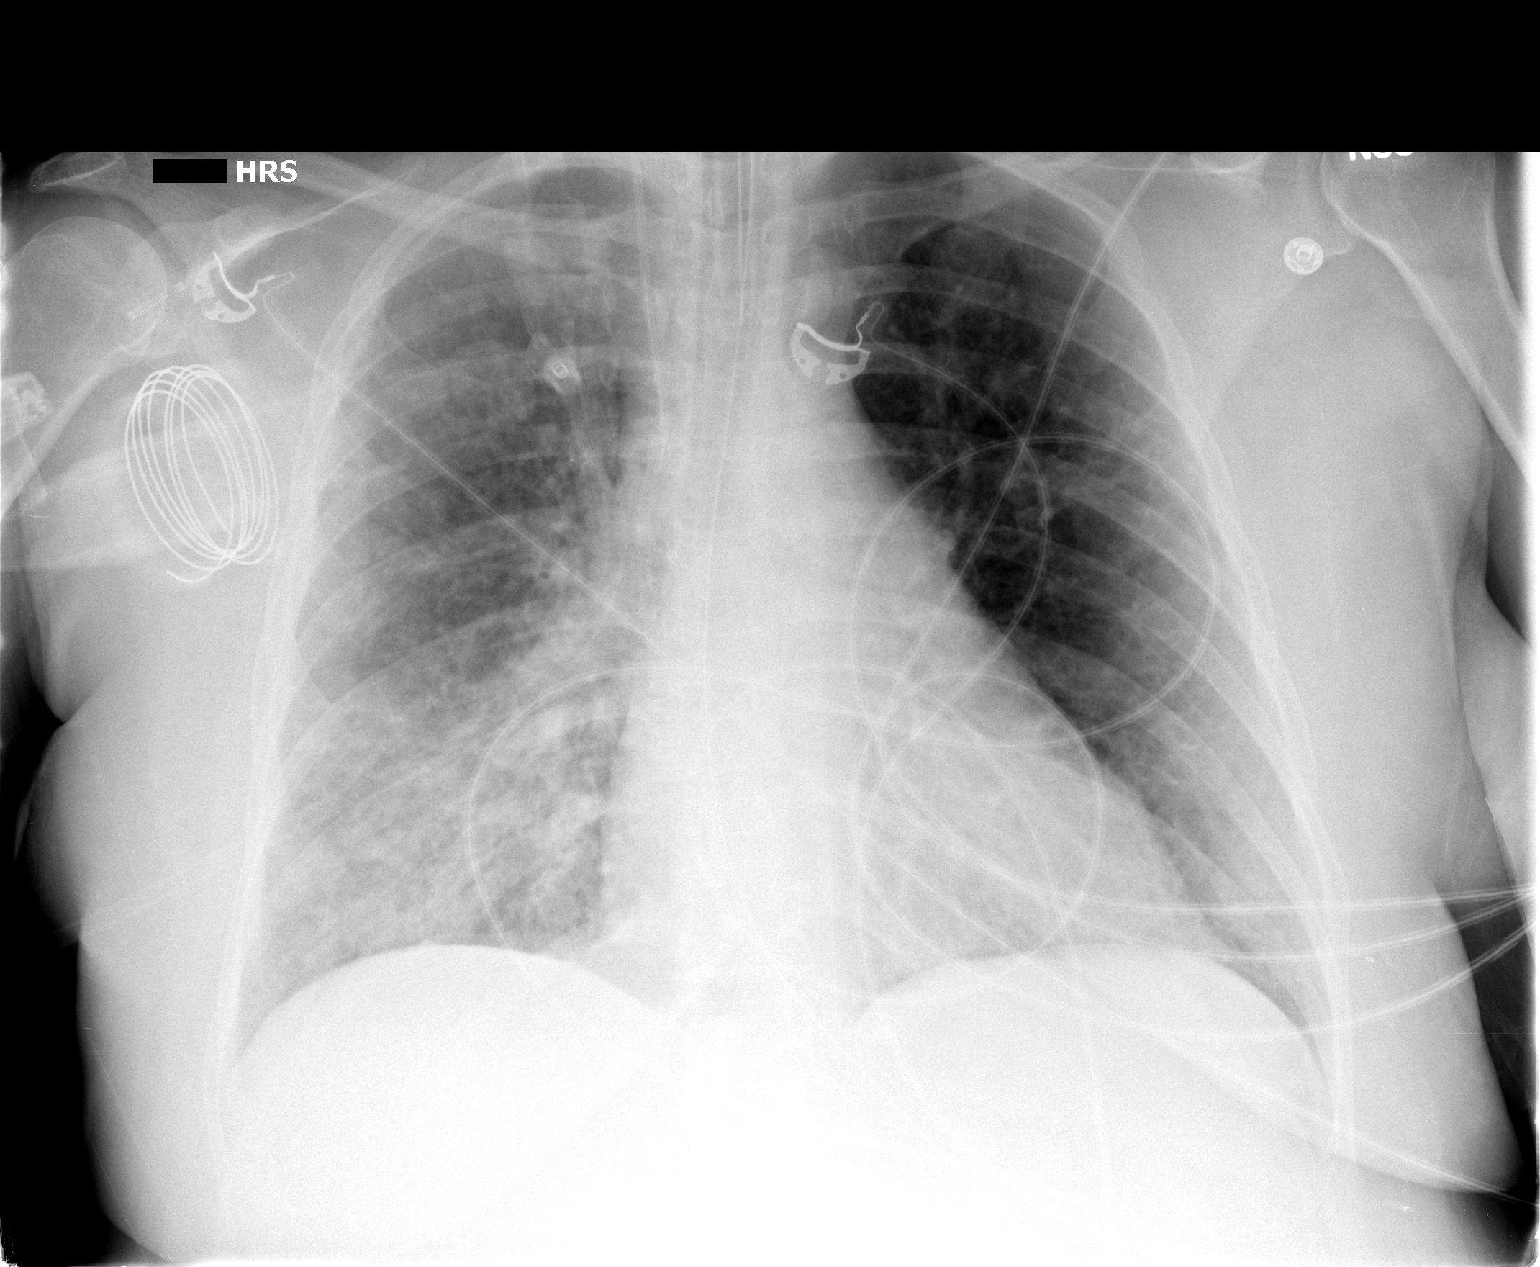

[1 of 1 positions shown; findings below may reference images not displayed]

FINDINGS: Endotracheal and enteric tubes are unchanged along with a
right-sided internal jugular line.  There is increasing patchy
opacity noted at the right lung base with ongoing pulmonary edema.
The heart is unchanged in size and contour.  The upper abdomen
osseous structures are also unchanged.
IMPRESSION: 1.  Stable support apparatus.
2.  Slight interval increase in patchy right lower lobe opacity
with persistent asymmetric opacity involving the right greater left
hemithorax.  Again, this could be related to infection or
asymmetric edema.

## 2012-04-06 IMAGING — CR DG CHEST 1V PORT
1 series · 1 of 1 positions shown · non-contrast
Comparison: 03/01/2011

CLINICAL DATA: Respiratory distress

PORTABLE CHEST - 1 VIEW

[AP]
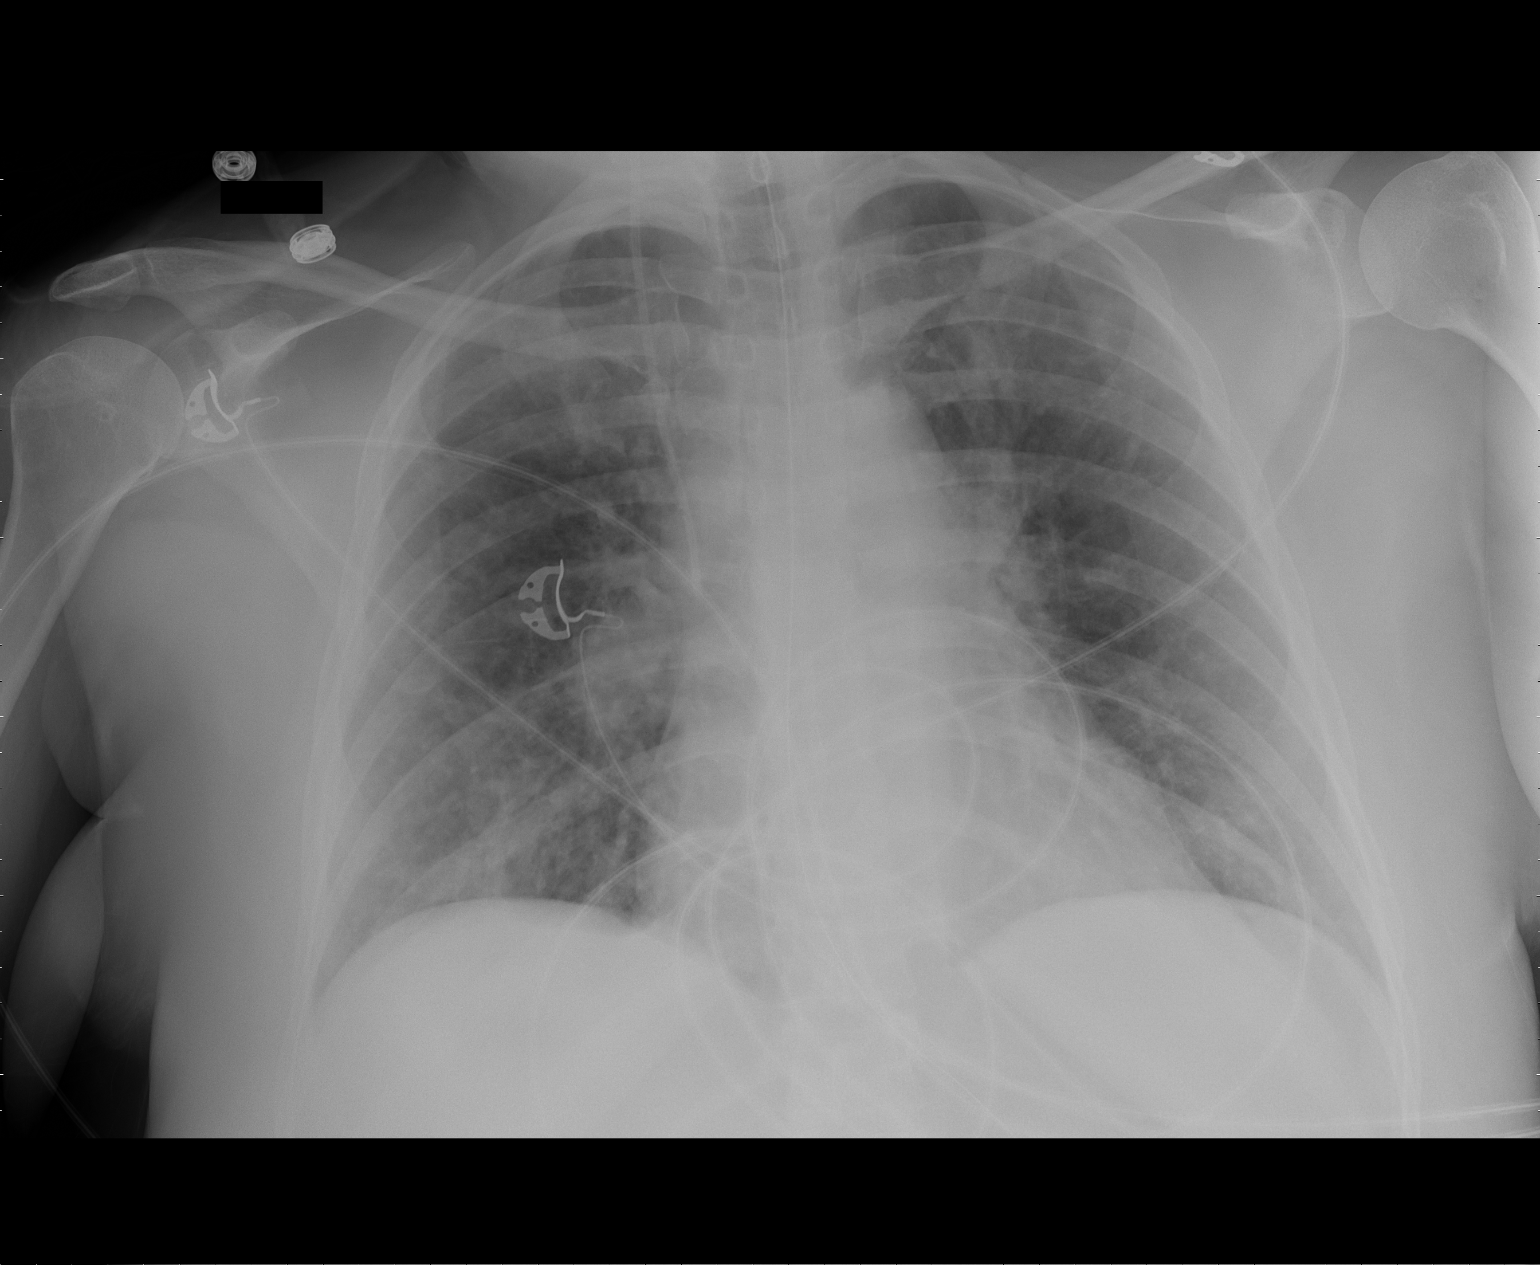

[1 of 1 positions shown; findings below may reference images not displayed]

FINDINGS: Endotracheal tube has been removed. Nasogastric tube
terminates below the level of the diaphragms but the tip is not
included on the film.  Heart size is normal.  Minimal central
vascular congestion noted without overt edema.  No pleural
effusion.  No pneumothorax. Right IJ central line tip terminates
over the distal SVC. Right lower lobe airspace consolidation has
improved.
IMPRESSION: Maintained aeration after extubation, with minimal central vascular
congestion.  Right lower lobe airspace disease has improved.

## 2012-04-08 IMAGING — CR DG CHEST 1V PORT
1 series · 1 of 1 positions shown · non-contrast
Comparison: 03/03/2011.

CLINICAL DATA: Respiratory failure.  Follow-up edema.

PORTABLE CHEST - 1 VIEW

[AP]
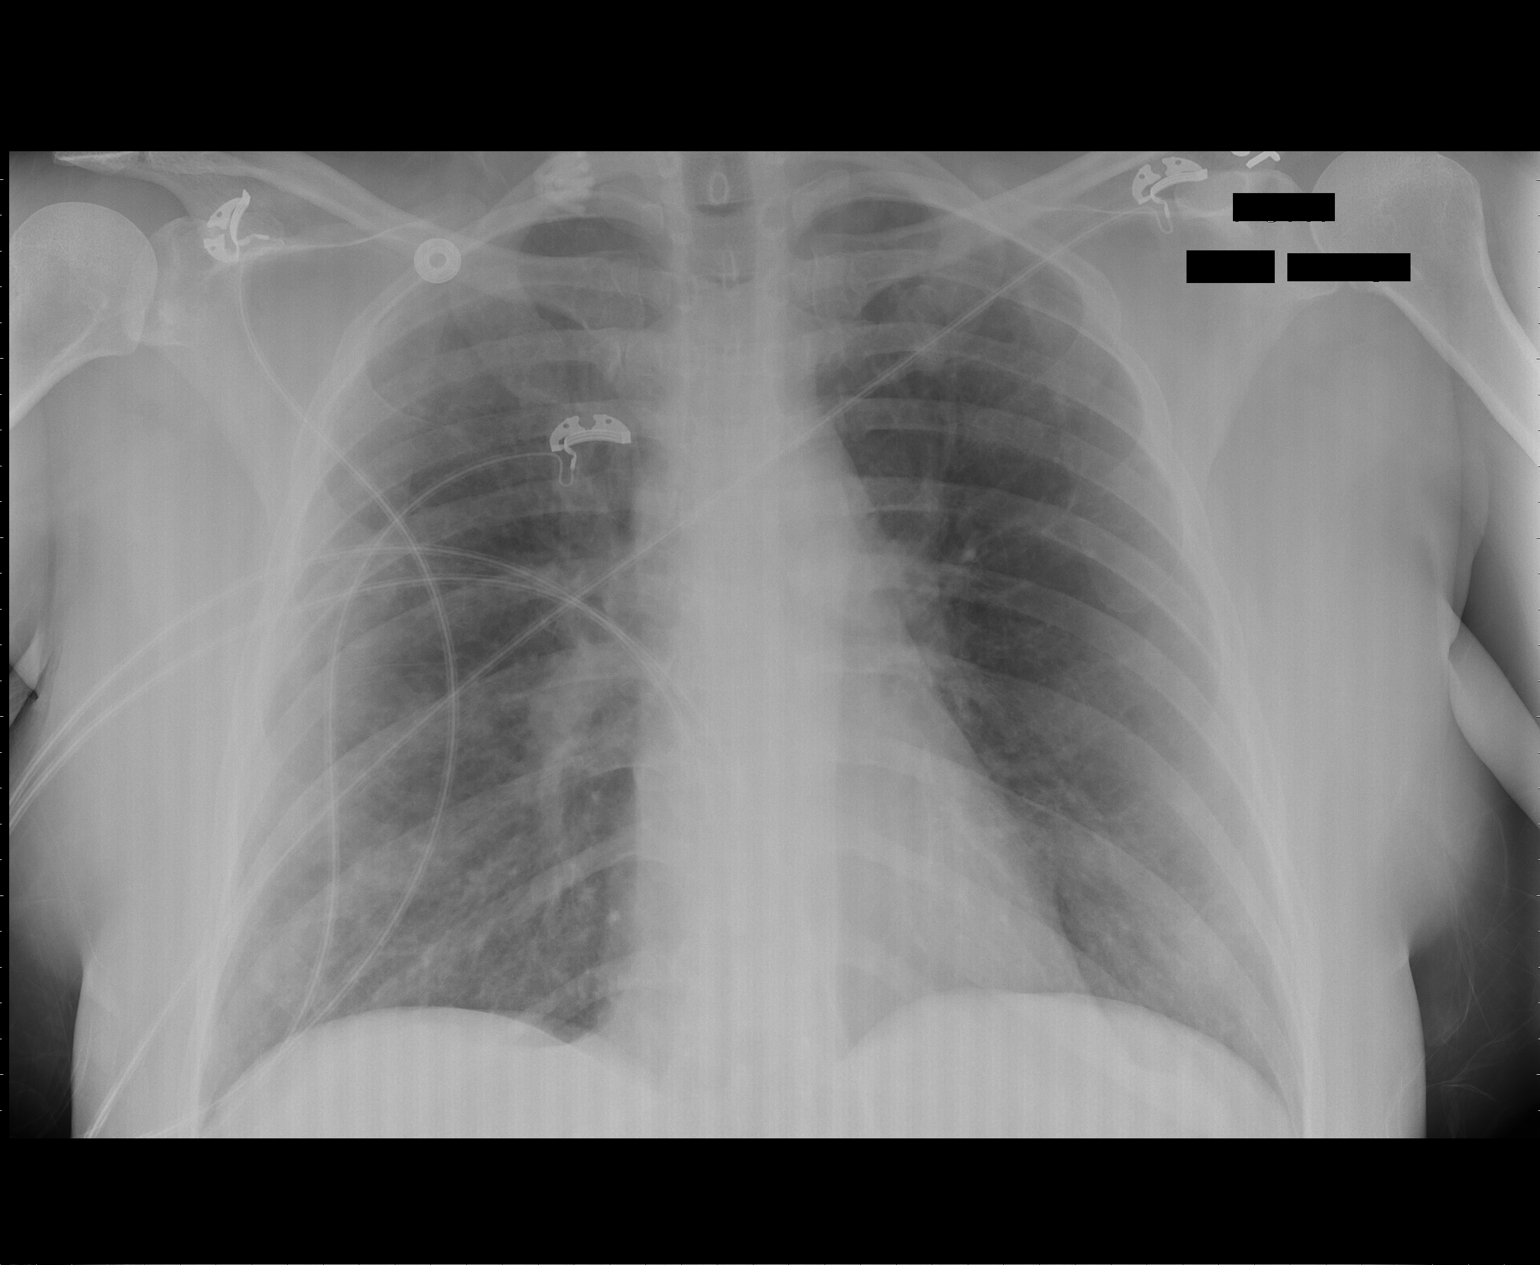

[1 of 1 positions shown; findings below may reference images not displayed]

FINDINGS: The cardiac silhouette is normal size and shape.  No
hilar or mediastinal enlargement.  No pleural effusion.  No
pneumothorax.  No alveolar pulmonary edema, consolidation, or acute
infiltrate is evident.  No skeletal lesion.  Slight hyperinflation
configuration.
IMPRESSION: No alveolar pulmonary edema, consolidation, or acute infiltrate is
evident. Heart normal size.

## 2012-04-14 ENCOUNTER — Ambulatory Visit (HOSPITAL_COMMUNITY): Payer: Medicaid Other

## 2012-04-21 ENCOUNTER — Other Ambulatory Visit: Payer: Self-pay | Admitting: *Deleted

## 2012-04-21 MED ORDER — ALBUTEROL SULFATE HFA 108 (90 BASE) MCG/ACT IN AERS: 2.0000 | INHALATION_SPRAY | Freq: Four times a day (QID) | RESPIRATORY_TRACT | Status: DC | PRN

## 2012-05-25 ENCOUNTER — Ambulatory Visit (HOSPITAL_COMMUNITY): Admission: RE | Admit: 2012-05-25 | Payer: Medicaid Other | Source: Ambulatory Visit

## 2012-06-07 IMAGING — CR DG CHEST 1V PORT
1 series · 1 of 1 positions shown · non-contrast
Comparison: Multiple priors dating back to 12/02/2010.

CLINICAL DATA: Short of breath.  Chest pain.

PORTABLE CHEST - 1 VIEW

[view not recorded]
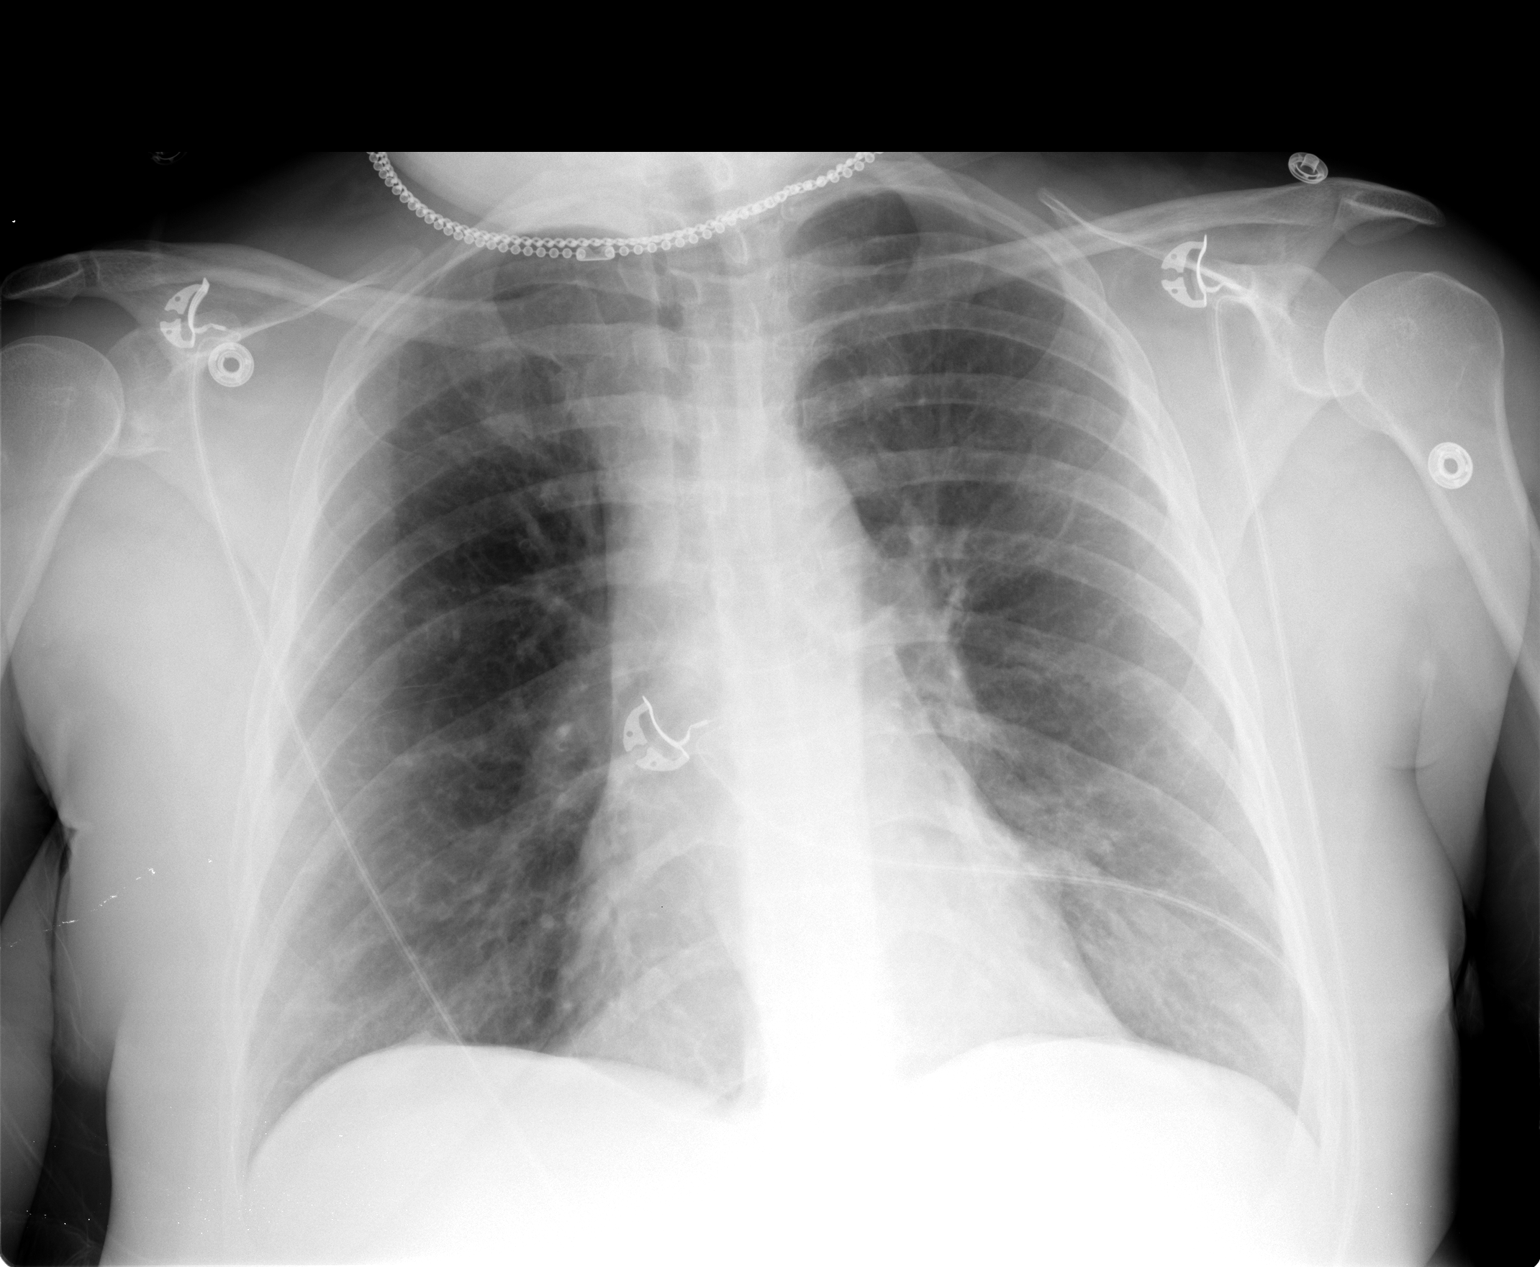

[1 of 1 positions shown; findings below may reference images not displayed]

FINDINGS: Cardiopericardial silhouette within normal limits.  Post
infectious/inflammatory changes are present at the right lung base.
There is no airspace disease or effusion on today's examination.
Trachea midline.  Mediastinal contours are within normal limits.
Monitoring leads are projected over the chest.
IMPRESSION: No acute cardiopulmonary disease.  Mild post
infectious/inflammatory changes at the right base.

## 2012-06-14 ENCOUNTER — Other Ambulatory Visit: Payer: Self-pay | Admitting: Emergency Medicine

## 2012-06-14 MED ORDER — ALBUTEROL SULFATE HFA 108 (90 BASE) MCG/ACT IN AERS: 2.0000 | INHALATION_SPRAY | Freq: Four times a day (QID) | RESPIRATORY_TRACT | Status: DC | PRN

## 2012-06-15 ENCOUNTER — Telehealth: Payer: Self-pay | Admitting: Emergency Medicine

## 2012-06-15 NOTE — Telephone Encounter (Signed)
Pt called back and asked to "disregard this message". Says pharmacy is faxing rx to another pharmacy and "this has been taken care of". [name]

## 2012-06-17 ENCOUNTER — Telehealth: Payer: Self-pay | Admitting: Emergency Medicine

## 2012-06-17 MED ORDER — TIOTROPIUM BROMIDE MONOHYDRATE 18 MCG IN CAPS: 18.0000 ug | ORAL_CAPSULE | Freq: Every day | RESPIRATORY_TRACT | Status: DC

## 2012-06-17 NOTE — Telephone Encounter (Signed)
RX sent to the pharmacy with msg that pt needs to call for an appt.

## 2012-07-17 ENCOUNTER — Emergency Department (HOSPITAL_COMMUNITY): Payer: Medicaid Other

## 2012-07-17 ENCOUNTER — Emergency Department (HOSPITAL_COMMUNITY)
Admission: EM | Admit: 2012-07-17 | Discharge: 2012-07-17 | Disposition: A | Discharge status: Home / Self Care | Payer: Medicaid Other | Attending: Emergency Medicine | Admitting: Emergency Medicine

## 2012-07-17 ENCOUNTER — Encounter (HOSPITAL_COMMUNITY): Payer: Self-pay | Admitting: *Deleted

## 2012-07-17 DIAGNOSIS — J449 Chronic obstructive pulmonary disease, unspecified: Secondary | ICD-10-CM

## 2012-07-17 DIAGNOSIS — B370 Candidal stomatitis: Secondary | ICD-10-CM

## 2012-07-17 DIAGNOSIS — F319 Bipolar disorder, unspecified: Secondary | ICD-10-CM | POA: Insufficient documentation

## 2012-07-17 DIAGNOSIS — Z9981 Dependence on supplemental oxygen: Secondary | ICD-10-CM | POA: Insufficient documentation

## 2012-07-17 DIAGNOSIS — E119 Type 2 diabetes mellitus without complications: Secondary | ICD-10-CM | POA: Insufficient documentation

## 2012-07-17 DIAGNOSIS — Z880 Allergy status to penicillin: Secondary | ICD-10-CM | POA: Insufficient documentation

## 2012-07-17 DIAGNOSIS — J329 Chronic sinusitis, unspecified: Secondary | ICD-10-CM

## 2012-07-17 DIAGNOSIS — J4489 Other specified chronic obstructive pulmonary disease: Secondary | ICD-10-CM | POA: Insufficient documentation

## 2012-07-17 DIAGNOSIS — Z87891 Personal history of nicotine dependence: Secondary | ICD-10-CM | POA: Insufficient documentation

## 2012-07-17 MED ORDER — PREDNISONE 20 MG PO TABS
60.0000 mg | ORAL_TABLET | Freq: Once | ORAL | Status: AC
  Administered 2012-07-17: 60 mg via ORAL

## 2012-07-17 MED ORDER — FLUCONAZOLE 200 MG PO TABS: ORAL_TABLET | ORAL | Status: DC

## 2012-07-17 MED ORDER — PREDNISONE 10 MG PO TABS
ORAL_TABLET | ORAL | Status: AC
  Administered 2012-07-17: 60 mg via ORAL
  Filled 2012-07-17: qty 6

## 2012-07-17 MED ORDER — SULFAMETHOXAZOLE-TMP DS 800-160 MG PO TABS
1.0000 | ORAL_TABLET | Freq: Once | ORAL | Status: AC
  Administered 2012-07-17: 1 via ORAL
  Filled 2012-07-17: qty 1

## 2012-07-17 MED ORDER — PREDNISONE 10 MG PO TABS: 20.0000 mg | ORAL_TABLET | Freq: Every day | ORAL | Status: DC

## 2012-07-17 MED ORDER — SULFAMETHOXAZOLE-TRIMETHOPRIM 200-40 MG/5ML PO SUSP: 20.0000 mL | Freq: Once | ORAL | Status: DC

## 2012-07-17 MED ORDER — SULFAMETHOXAZOLE-TRIMETHOPRIM 800-160 MG PO TABS: 1.0000 | ORAL_TABLET | Freq: Two times a day (BID) | ORAL | Status: AC

## 2012-07-17 NOTE — ED Notes (Signed)
Pt given diet coke. 

## 2012-07-17 NOTE — ED Notes (Signed)
Pt called out stating she was in pain, requesting pain meds, edp notified.

## 2012-07-17 NOTE — ED Notes (Addendum)
Pt c/o headache x 1 wk. Nasal congestion and chest congestion. Pt also c/o thrush in her mouth.

## 2012-07-18 NOTE — ED Provider Notes (Signed)
History     CSN: [id number]  Arrival date & time 07/17/12  0422   First MD Initiated Contact with Patient 07/17/12 [phone]      Chief Complaint  Patient presents with  . Cough  . Headache  . Nasal Congestion    (Consider location/radiation/quality/duration/timing/severity/associated sxs/prior treatment) HPI [name] is a 46 y.o. female with a h/o COPD on chronic O2, DM, migraine headaches, bipolar d/o who presents to the Emergency Department complaining of cough, headache, nasal congestion x 1 week associated with occasional wheezing, yellow nasal discharge and mild nausea. Denies fever, chills, shortness of breath. Has been compliant with home medicines.   PCP Dr. [name]  Past Medical History  Diagnosis Date  . Migraine headache   . On home O2   . COPD (chronic obstructive pulmonary disease)   . Bipolar disorder   . Chronic back pain   . Hyperglycemia, drug-induced     steroid induced hyperglycemia  . ARDS (adult respiratory distress syndrome)     Jan 2011  . Diabetes mellitus   . Pneumonia   . Angina   . Asthma   . Shortness of breath   . Recurrent upper respiratory infection (URI)     Past Surgical History  Procedure Date  . Tubal ligation   . Uterine ablasion   . C-section   . Tracheostomy     decannulated 12/2009    Family History  Problem Relation Age of Onset  . Coronary artery disease Brother   . Diabetes Other   . Cancer Other   . Hypertension Other     History  Substance Use Topics  . Smoking status: Former Smoker -- 1.5 packs/day for 33 years    Types: Cigarettes    Quit date: 07/30/2011  . Smokeless tobacco: Not on file   Comment: smoking 1-2 cigarettes every other day  . Alcohol Use: No    OB History    Grav Para Term Preterm Abortions TAB SAB Ect Mult Living                  Review of Systems  Constitutional: Negative for fever.       10 Systems reviewed and are negative for acute change except as noted in the HPI.  HENT:  Positive for congestion.   Eyes: Negative for discharge and redness.  Respiratory: Positive for cough and wheezing. Negative for shortness of breath.   Cardiovascular: Negative for chest pain.  Gastrointestinal: Positive for nausea. Negative for vomiting and abdominal pain.  Musculoskeletal: Negative for back pain.  Skin: Negative for rash.  Neurological: Positive for headaches. Negative for syncope and numbness.  Psychiatric/Behavioral:       No behavior change.    Allergies  Penicillins and Morphine  Home Medications   Current Outpatient Rx  Name Route Sig Dispense Refill  . ALBUTEROL SULFATE (2.5 MG/3ML) 0.083% IN NEBU Nebulization Take 2.5 mg by nebulization every 4 (four) hours as needed. For shortness of breath     . ALBUTEROL SULFATE HFA 108 (90 BASE) MCG/ACT IN AERS Inhalation Inhale 2 puffs into the lungs every 6 (six) hours as needed for wheezing (PATIENT NEEDS APPOINTMENT FOR FURTHER REFILLS.). 18 g 0  . BUDESONIDE-FORMOTEROL FUMARATE 160-4.5 MCG/ACT IN AERO Inhalation Inhale 2 puffs into the lungs 2 (two) times daily.    . CHOLINE FENOFIBRATE 135 MG PO CPDR Oral Take 135 mg by mouth daily.      [name] METFORMIN HCL ER 500 MG PO TB24 Oral  Take 500 mg by mouth daily with breakfast.    . NYSTATIN 100000 UNIT/ML MT SUSP Oral Take 500,000 Units by mouth 4 (four) times daily as needed. Thrust    . OXYCODONE-ACETAMINOPHEN 10-325 MG PO TABS Oral Take 1 tablet by mouth every 4 (four) hours as needed. For pain 30 tablet 0  . PROMETHAZINE HCL 25 MG PO TABS Oral Take 25 mg by mouth every 6 (six) hours as needed.    . SUMATRIPTAN SUCCINATE 25 MG PO TABS Oral Take 25 mg by mouth every 2 (two) hours as needed.    [name] TIOTROPIUM BROMIDE MONOHYDRATE 18 MCG IN CAPS Inhalation Place 1 capsule (18 mcg total) into inhaler and inhale daily. 30 capsule 1    PATIENT NEEDS TO CALL FOR AN APPOINTMENT FOR ANY A ...  . TRAMADOL HCL 50 MG PO TABS Oral Take 50 mg by mouth every 6 (six) hours as needed.    [name]  FLUCONAZOLE 200 MG PO TABS  Take one tablet upon completion of your antibiotics 1 tablet 0  . PREDNISONE 10 MG PO TABS Oral Take 2 tablets (20 mg total) by mouth daily. 10 tablet 0  . PROMETHAZINE HCL 25 MG PO TABS Oral Take 1 tablet (25 mg total) by mouth every 4 (four) hours as needed for nausea. 12 tablet 0  . PROMETHAZINE HCL 25 MG PO TABS Oral Take 1 tablet (25 mg total) by mouth every 6 (six) hours as needed for nausea. 10 tablet 0  . SULFAMETHOXAZOLE-TRIMETHOPRIM 800-160 MG PO TABS Oral Take 1 tablet by mouth 2 (two) times daily. 40 tablet 0    BP 138/70  Pulse 99  Temp 98.5 F (36.9 C) (Oral)  Resp 20  Wt 170 lb 6 oz (77.282 kg)  SpO2 98%  Physical Exam  Nursing note and vitals reviewed. Constitutional: She is oriented to person, place, and time. She appears well-developed and well-nourished.       Awake, alert, nontoxic appearance.  HENT:  Head: Normocephalic and atraumatic.  Right Ear: External ear normal.  Left Ear: External ear normal.  Nose: Nose normal.       Mild facial discomfort with percussion over the left maxillary sinus.Thrush to tongue and buccal membranes.  Eyes: Right eye exhibits no discharge. Left eye exhibits no discharge.  Neck: Neck supple.  Cardiovascular: Normal heart sounds.   Pulmonary/Chest: Effort normal and breath sounds normal. No respiratory distress. She exhibits no tenderness.       Occasional end expiratory wheeze  Abdominal: Soft. There is no tenderness. There is no rebound.  Musculoskeletal: She exhibits no tenderness.       Baseline ROM, no obvious new focal weakness.  Neurological: She is alert and oriented to person, place, and time.       Mental status and motor strength appears baseline for patient and situation.  Skin: No rash noted.  Psychiatric: She has a normal mood and affect.    ED Course  Procedures (including critical care time)  Labs Reviewed - No data to display Dg Chest 2 View  07/17/2012  *RADIOLOGY REPORT*   Clinical Data: Chest pain, shortness of breath.  CHEST - 2 VIEW  Comparison: 02/05/2012  Findings: Hazy appearance to the right lung bases is favored to be accentuated by overlying soft tissues rather than lung parenchyma process. However, there is interstitial prominence.  Heart size upper normal.  Mild central peribronchial thickening.  No acute osseous finding.  IMPRESSION: Interstitial prominence and peribronchial thickening may reflect acute  and/or chronic bronchitic changes.   Original Report Authenticated By: Waneta Martins, M.D.      1. COPD (chronic obstructive pulmonary disease)   2. Thrush   3. Sinusitis       MDM  Patient with O2 dependent COPD here with headache, nasal congestion, cough. Chest xray reflects chronic bronchitic changes. PE with facial discomfort and nasal drainage c/w sinusitis. Initiated antibiotic therapy and steroid therapy. Pt stable in ED with no significant deterioration in condition.The patient appears reasonably screened and/or stabilized for discharge and I doubt any other medical condition or other [facility] requiring further screening, evaluation, or treatment in the ED at this time prior to discharge.  MDM Reviewed: nursing note and vitals Interpretation: x-ray           Nicoletta Dress. Colon Branch, MD 07/18/12 1610

## 2012-07-25 ENCOUNTER — Other Ambulatory Visit: Payer: Self-pay | Admitting: Emergency Medicine

## 2012-09-20 ENCOUNTER — Other Ambulatory Visit (HOSPITAL_COMMUNITY): Payer: Self-pay | Admitting: Pulmonary Disease

## 2012-09-20 ENCOUNTER — Ambulatory Visit (HOSPITAL_COMMUNITY)
Admission: RE | Admit: 2012-09-20 | Discharge: 2012-09-20 | Disposition: A | Discharge status: Home / Self Care | Payer: Medicaid Other | Source: Ambulatory Visit | Attending: Pulmonary Disease | Admitting: Pulmonary Disease

## 2012-09-20 DIAGNOSIS — J449 Chronic obstructive pulmonary disease, unspecified: Secondary | ICD-10-CM

## 2012-09-20 DIAGNOSIS — E119 Type 2 diabetes mellitus without complications: Secondary | ICD-10-CM | POA: Insufficient documentation

## 2012-09-20 DIAGNOSIS — J4489 Other specified chronic obstructive pulmonary disease: Secondary | ICD-10-CM | POA: Insufficient documentation

## 2012-09-20 DIAGNOSIS — R0602 Shortness of breath: Secondary | ICD-10-CM

## 2012-10-03 ENCOUNTER — Other Ambulatory Visit (HOSPITAL_COMMUNITY): Payer: Self-pay | Admitting: Pulmonary Disease

## 2012-10-03 DIAGNOSIS — N644 Mastodynia: Secondary | ICD-10-CM

## 2012-10-12 ENCOUNTER — Encounter (HOSPITAL_COMMUNITY): Payer: Medicaid Other

## 2012-10-23 ENCOUNTER — Emergency Department (HOSPITAL_COMMUNITY)
Admission: EM | Admit: 2012-10-23 | Discharge: 2012-10-23 | Disposition: A | Discharge status: Home / Self Care | Payer: Medicaid Other | Attending: Emergency Medicine | Admitting: Emergency Medicine

## 2012-10-23 ENCOUNTER — Encounter (HOSPITAL_COMMUNITY): Payer: Self-pay

## 2012-10-23 DIAGNOSIS — Z8709 Personal history of other diseases of the respiratory system: Secondary | ICD-10-CM | POA: Insufficient documentation

## 2012-10-23 DIAGNOSIS — Z8679 Personal history of other diseases of the circulatory system: Secondary | ICD-10-CM | POA: Insufficient documentation

## 2012-10-23 DIAGNOSIS — W108XXA Fall (on) (from) other stairs and steps, initial encounter: Secondary | ICD-10-CM | POA: Insufficient documentation

## 2012-10-23 DIAGNOSIS — R7309 Other abnormal glucose: Secondary | ICD-10-CM | POA: Insufficient documentation

## 2012-10-23 DIAGNOSIS — M549 Dorsalgia, unspecified: Secondary | ICD-10-CM | POA: Insufficient documentation

## 2012-10-23 DIAGNOSIS — G2581 Restless legs syndrome: Secondary | ICD-10-CM | POA: Insufficient documentation

## 2012-10-23 DIAGNOSIS — Z9981 Dependence on supplemental oxygen: Secondary | ICD-10-CM | POA: Insufficient documentation

## 2012-10-23 DIAGNOSIS — M79606 Pain in leg, unspecified: Secondary | ICD-10-CM

## 2012-10-23 DIAGNOSIS — J4489 Other specified chronic obstructive pulmonary disease: Secondary | ICD-10-CM | POA: Insufficient documentation

## 2012-10-23 DIAGNOSIS — Y9289 Other specified places as the place of occurrence of the external cause: Secondary | ICD-10-CM | POA: Insufficient documentation

## 2012-10-23 DIAGNOSIS — S8990XA Unspecified injury of unspecified lower leg, initial encounter: Secondary | ICD-10-CM | POA: Insufficient documentation

## 2012-10-23 DIAGNOSIS — E119 Type 2 diabetes mellitus without complications: Secondary | ICD-10-CM | POA: Insufficient documentation

## 2012-10-23 DIAGNOSIS — Z8669 Personal history of other diseases of the nervous system and sense organs: Secondary | ICD-10-CM | POA: Insufficient documentation

## 2012-10-23 DIAGNOSIS — Z79899 Other long term (current) drug therapy: Secondary | ICD-10-CM | POA: Insufficient documentation

## 2012-10-23 DIAGNOSIS — I82409 Acute embolism and thrombosis of unspecified deep veins of unspecified lower extremity: Secondary | ICD-10-CM | POA: Insufficient documentation

## 2012-10-23 DIAGNOSIS — R0602 Shortness of breath: Secondary | ICD-10-CM | POA: Insufficient documentation

## 2012-10-23 DIAGNOSIS — S99929A Unspecified injury of unspecified foot, initial encounter: Secondary | ICD-10-CM | POA: Insufficient documentation

## 2012-10-23 DIAGNOSIS — J9589 Other postprocedural complications and disorders of respiratory system, not elsewhere classified: Secondary | ICD-10-CM | POA: Insufficient documentation

## 2012-10-23 DIAGNOSIS — Y9389 Activity, other specified: Secondary | ICD-10-CM | POA: Insufficient documentation

## 2012-10-23 DIAGNOSIS — Z8701 Personal history of pneumonia (recurrent): Secondary | ICD-10-CM | POA: Insufficient documentation

## 2012-10-23 DIAGNOSIS — J449 Chronic obstructive pulmonary disease, unspecified: Secondary | ICD-10-CM | POA: Insufficient documentation

## 2012-10-23 DIAGNOSIS — Z87891 Personal history of nicotine dependence: Secondary | ICD-10-CM | POA: Insufficient documentation

## 2012-10-23 DIAGNOSIS — G8929 Other chronic pain: Secondary | ICD-10-CM | POA: Insufficient documentation

## 2012-10-23 DIAGNOSIS — F319 Bipolar disorder, unspecified: Secondary | ICD-10-CM | POA: Insufficient documentation

## 2012-10-23 MED ORDER — OXYCODONE-ACETAMINOPHEN 5-325 MG PO TABS: 1.0000 | ORAL_TABLET | ORAL | Status: DC | PRN

## 2012-10-23 MED ORDER — KETOROLAC TROMETHAMINE 60 MG/2ML IM SOLN
60.0000 mg | Freq: Once | INTRAMUSCULAR | Status: AC
  Administered 2012-10-23: 60 mg via INTRAMUSCULAR

## 2012-10-23 MED ORDER — NAPROXEN 500 MG PO TABS: 500.0000 mg | ORAL_TABLET | Freq: Two times a day (BID) | ORAL | Status: DC

## 2012-10-23 MED ORDER — HYDROMORPHONE HCL PF 1 MG/ML IJ SOLN
1.0000 mg | Freq: Once | INTRAMUSCULAR | Status: AC
  Administered 2012-10-23: 1 mg via INTRAMUSCULAR

## 2012-10-23 MED ORDER — KETOROLAC TROMETHAMINE 60 MG/2ML IM SOLN
INTRAMUSCULAR | Status: AC
  Filled 2012-10-23: qty 2

## 2012-10-23 MED ORDER — HYDROMORPHONE HCL PF 1 MG/ML IJ SOLN
INTRAMUSCULAR | Status: AC
  Filled 2012-10-23: qty 1

## 2012-10-23 NOTE — ED Notes (Signed)
Patient states that both of her legs are hurting. States that it feels like a cross between her restless leg syndrome and sciatica.

## 2012-10-23 NOTE — ED Provider Notes (Signed)
History     CSN: [id number]  Arrival date & time 10/23/12  0013   First MD Initiated Contact with Patient 10/23/12 0055      Chief Complaint  Patient presents with  . Leg Pain    (Consider location/radiation/quality/duration/timing/severity/associated sxs/prior treatment) HPI Comments: 46 year old female presents after falling down the stairs 4 days ago. She states that she hurt her legs, has been ambulatory but feels like she has worsening restless leg syndrome over the last 2 days. She is not able to sleep because of generalized pain in her legs, there is no associated swelling or deformities or bruising. She has been able to ambulate with minimal difficulty. She denies any coughing but does have chronic shortness of breath secondary to pulmonary disease which requires home oxygen and bronchodilator therapy. This is not worsened compared to normal. She does admit to having sciatic pain on the left in the past and feels like his pain is back over the last couple of days  Patient is a 46 y.o. female presenting with leg pain. The history is provided by the patient.  Leg Pain  Pertinent negatives include no numbness.    Past Medical History  Diagnosis Date  . Migraine headache   . On home O2   . COPD (chronic obstructive pulmonary disease)   . Bipolar disorder   . Chronic back pain   . Hyperglycemia, drug-induced     steroid induced hyperglycemia  . ARDS (adult respiratory distress syndrome)     Jan 2011  . Diabetes mellitus   . Pneumonia   . Angina   . Asthma   . Shortness of breath   . Recurrent upper respiratory infection (URI)     Past Surgical History  Procedure Date  . Tubal ligation   . Uterine ablasion   . C-section   . Tracheostomy     decannulated 12/2009    Family History  Problem Relation Age of Onset  . Coronary artery disease Brother   . Diabetes Other   . Cancer Other   . Hypertension Other     History  Substance Use Topics  . Smoking status:  Former Smoker -- 1.5 packs/day for 33 years    Types: Cigarettes    Quit date: 07/30/2011  . Smokeless tobacco: Not on file     Comment: smoking 1-2 cigarettes every other day  . Alcohol Use: No    OB History    Grav Para Term Preterm Abortions TAB SAB Ect Mult Living                  Review of Systems  Constitutional: Negative for fever.  Respiratory: Positive for shortness of breath.   Musculoskeletal: Negative for back pain and joint swelling.  Neurological: Negative for weakness and numbness.    Allergies  Penicillins and Morphine  Home Medications   Current Outpatient Rx  Name  Route  Sig  Dispense  Refill  . ALBUTEROL SULFATE (2.5 MG/3ML) 0.083% IN NEBU   Nebulization   Take 2.5 mg by nebulization every 4 (four) hours as needed. For shortness of breath          . BUDESONIDE-FORMOTEROL FUMARATE 160-4.5 MCG/ACT IN AERO   Inhalation   Inhale 2 puffs into the lungs 2 (two) times daily.         . CHOLINE FENOFIBRATE 135 MG PO CPDR   Oral   Take 135 mg by mouth daily.           [name]  FLUCONAZOLE 200 MG PO TABS      Take one tablet upon completion of your antibiotics   1 tablet   0   . NYSTATIN 100000 UNIT/ML MT SUSP   Oral   Take 500,000 Units by mouth 4 (four) times daily as needed. Thrust         . OXYCODONE-ACETAMINOPHEN 10-325 MG PO TABS   Oral   Take 1 tablet by mouth every 4 (four) hours as needed. For pain   30 tablet   0   . PROAIR HFA 108 (90 BASE) MCG/ACT IN AERS      INHALE 2 PUFFS EVERY 6 HOURS AS NEEDED FOR WHEEZING.   8.5 each   2     Pt needs to schedule an appt   . PROMETHAZINE HCL 25 MG PO TABS   Oral   Take 25 mg by mouth every 6 (six) hours as needed.         . SUMATRIPTAN SUCCINATE 25 MG PO TABS   Oral   Take 25 mg by mouth every 2 (two) hours as needed.         [name] TIOTROPIUM BROMIDE MONOHYDRATE 18 MCG IN CAPS   Inhalation   Place 1 capsule (18 mcg total) into inhaler and inhale daily.   30 capsule   1      PATIENT NEEDS TO CALL FOR AN APPOINTMENT FOR ANY A ...   . TRAMADOL HCL 50 MG PO TABS   Oral   Take 50 mg by mouth every 6 (six) hours as needed.         [name] METFORMIN HCL ER 500 MG PO TB24   Oral   Take 500 mg by mouth daily with breakfast.         . NAPROXEN 500 MG PO TABS   Oral   Take 1 tablet (500 mg total) by mouth 2 (two) times daily with a meal.   30 tablet   0   . OXYCODONE-ACETAMINOPHEN 5-325 MG PO TABS   Oral   Take 1 tablet by mouth every 4 (four) hours as needed for pain.   20 tablet   0   . PREDNISONE 10 MG PO TABS   Oral   Take 2 tablets (20 mg total) by mouth daily.   10 tablet   0   . PROMETHAZINE HCL 25 MG PO TABS   Oral   Take 1 tablet (25 mg total) by mouth every 4 (four) hours as needed for nausea.   12 tablet   0   . PROMETHAZINE HCL 25 MG PO TABS   Oral   Take 1 tablet (25 mg total) by mouth every 6 (six) hours as needed for nausea.   10 tablet   0     BP 145/72  Pulse 102  Temp 98.9 F (37.2 C) (Oral)  Resp 20  SpO2 96%  Physical Exam  Nursing note and vitals reviewed. Constitutional: She appears well-developed and well-nourished. No distress.  HENT:  Head: Normocephalic and atraumatic.  Mouth/Throat: Oropharynx is clear and moist. No oropharyngeal exudate.  Eyes: Conjunctivae normal and EOM are normal. Pupils are equal, round, and reactive to light. Right eye exhibits no discharge. Left eye exhibits no discharge. No scleral icterus.  Neck: Normal range of motion. Neck supple. No JVD present. No thyromegaly present.  Cardiovascular: Normal rate, regular rhythm, normal heart sounds and intact distal pulses.  Exam reveals no gallop and no friction rub.   No murmur heard.  Pulmonary/Chest: Effort normal. No respiratory distress. She has wheezes ( Diffuse mild bilateral end expiratory wheezing, no increased work of breathing, speaks in full sentences). She has no rales.  Abdominal: Soft. Bowel sounds are normal. She exhibits no  distension and no mass. There is no tenderness.  Musculoskeletal: Normal range of motion. She exhibits no edema and no tenderness.       Old range of motion at the bilateral hips, knees, ankles. No signs of deformity, no bruising, no swelling, pain increased with straight leg raise bilaterally, relieved with flexion at the hip and knee.  Lymphadenopathy:    She has no cervical adenopathy.  Neurological: She is alert. Coordination normal.  Skin: Skin is warm and dry. No rash noted. No erythema.  Psychiatric: She has a normal mood and affect. Her behavior is normal.    ED Course  Procedures (including critical care time)  Labs Reviewed - No data to display No results found.   1. Leg pain       MDM  No numbness or weakness of the lower extremities, moving them equally well, appears uncomfortable the legs, will give pain medication and followup with family doctor. Unlikely that this bilateral pain that started after a fall is related to an acute DVT given no swelling or bruising. She has been ambulatory over the last couple of days as well. She will need to followup with her family Dr. for further testing I've encouraged her to have this done should she not have significant improvement over the next 48 hours. Pulmonary status is at baseline for her, she does not require any further breathing treatments at this time, oxygen level 96%, no fever, normal blood pressure.   Discharge Prescriptions include:  Percocet Naprosyn        Vida Roller, MD 10/23/12 [phone]

## 2012-10-31 IMAGING — CR DG CHEST 2V
2 series · 2 of 2 positions shown · non-contrast
Comparison: 05/03/2011

CLINICAL DATA: Fever.  Cough.

CHEST - 2 VIEW

[view not recorded (1 of 2)]
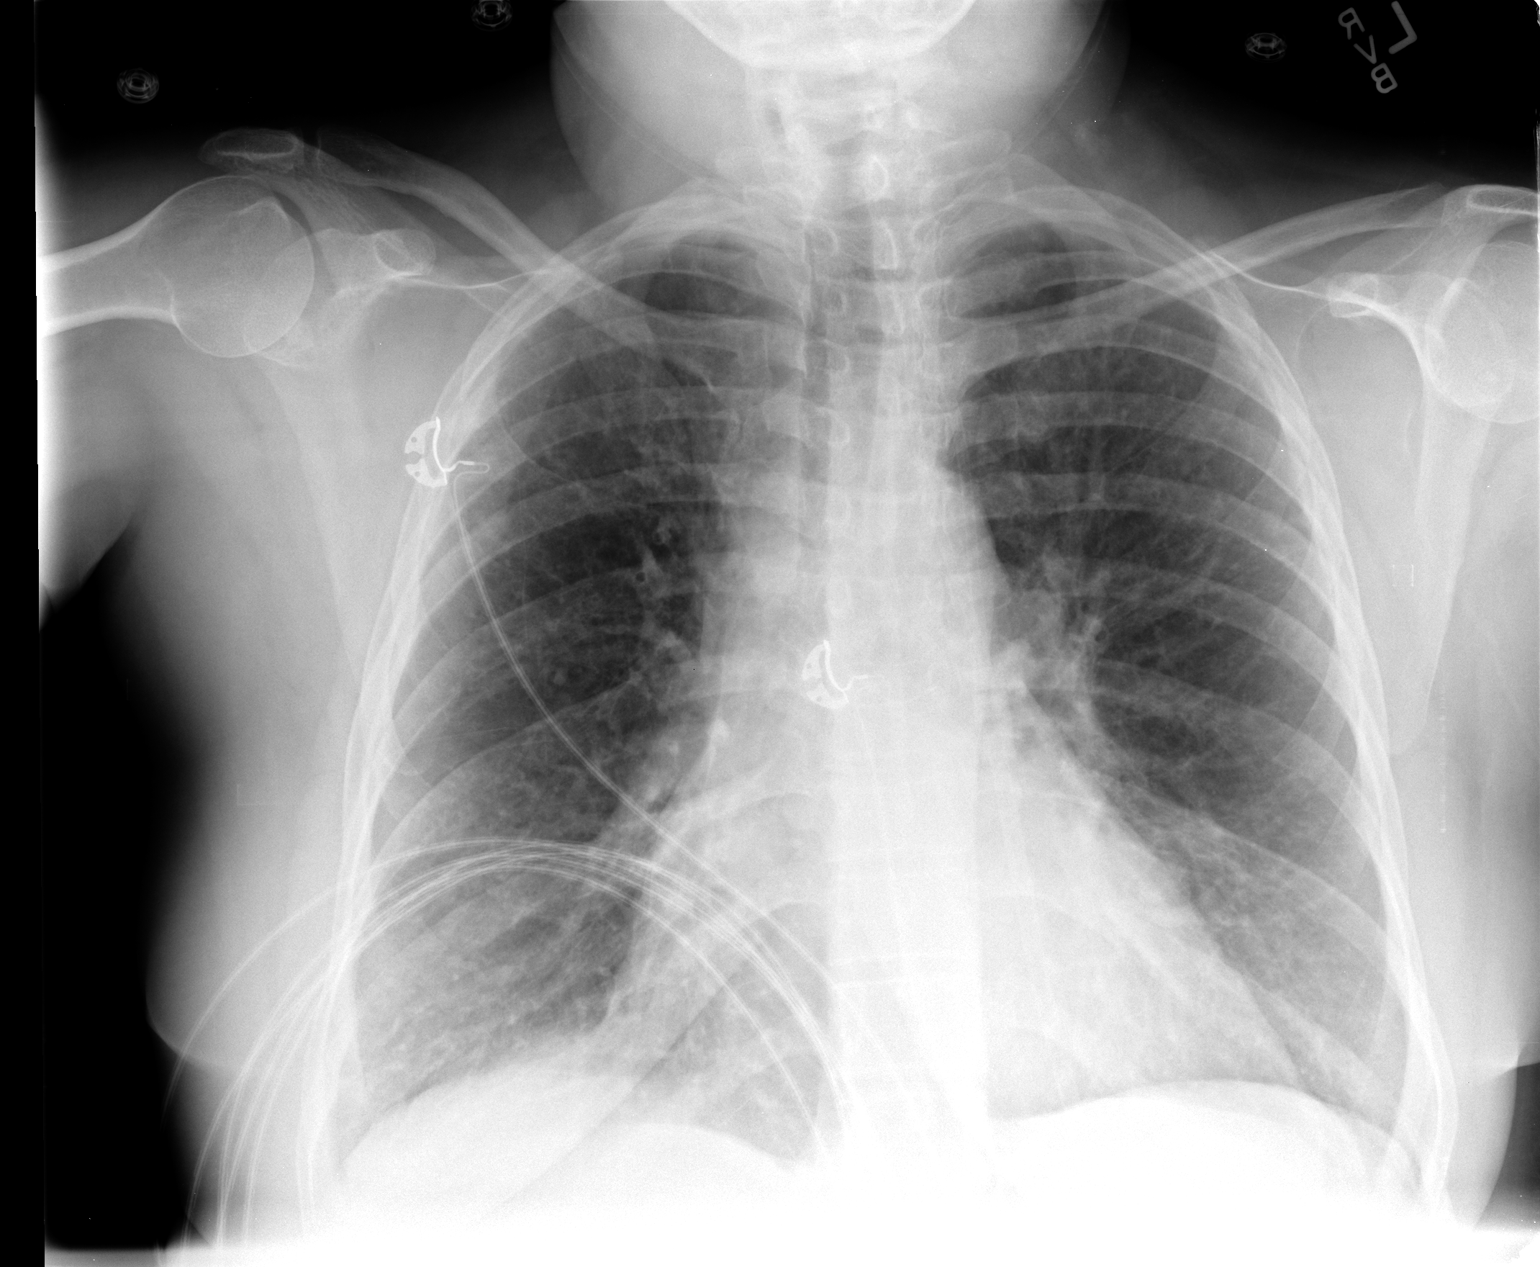

[view not recorded (2 of 2)]
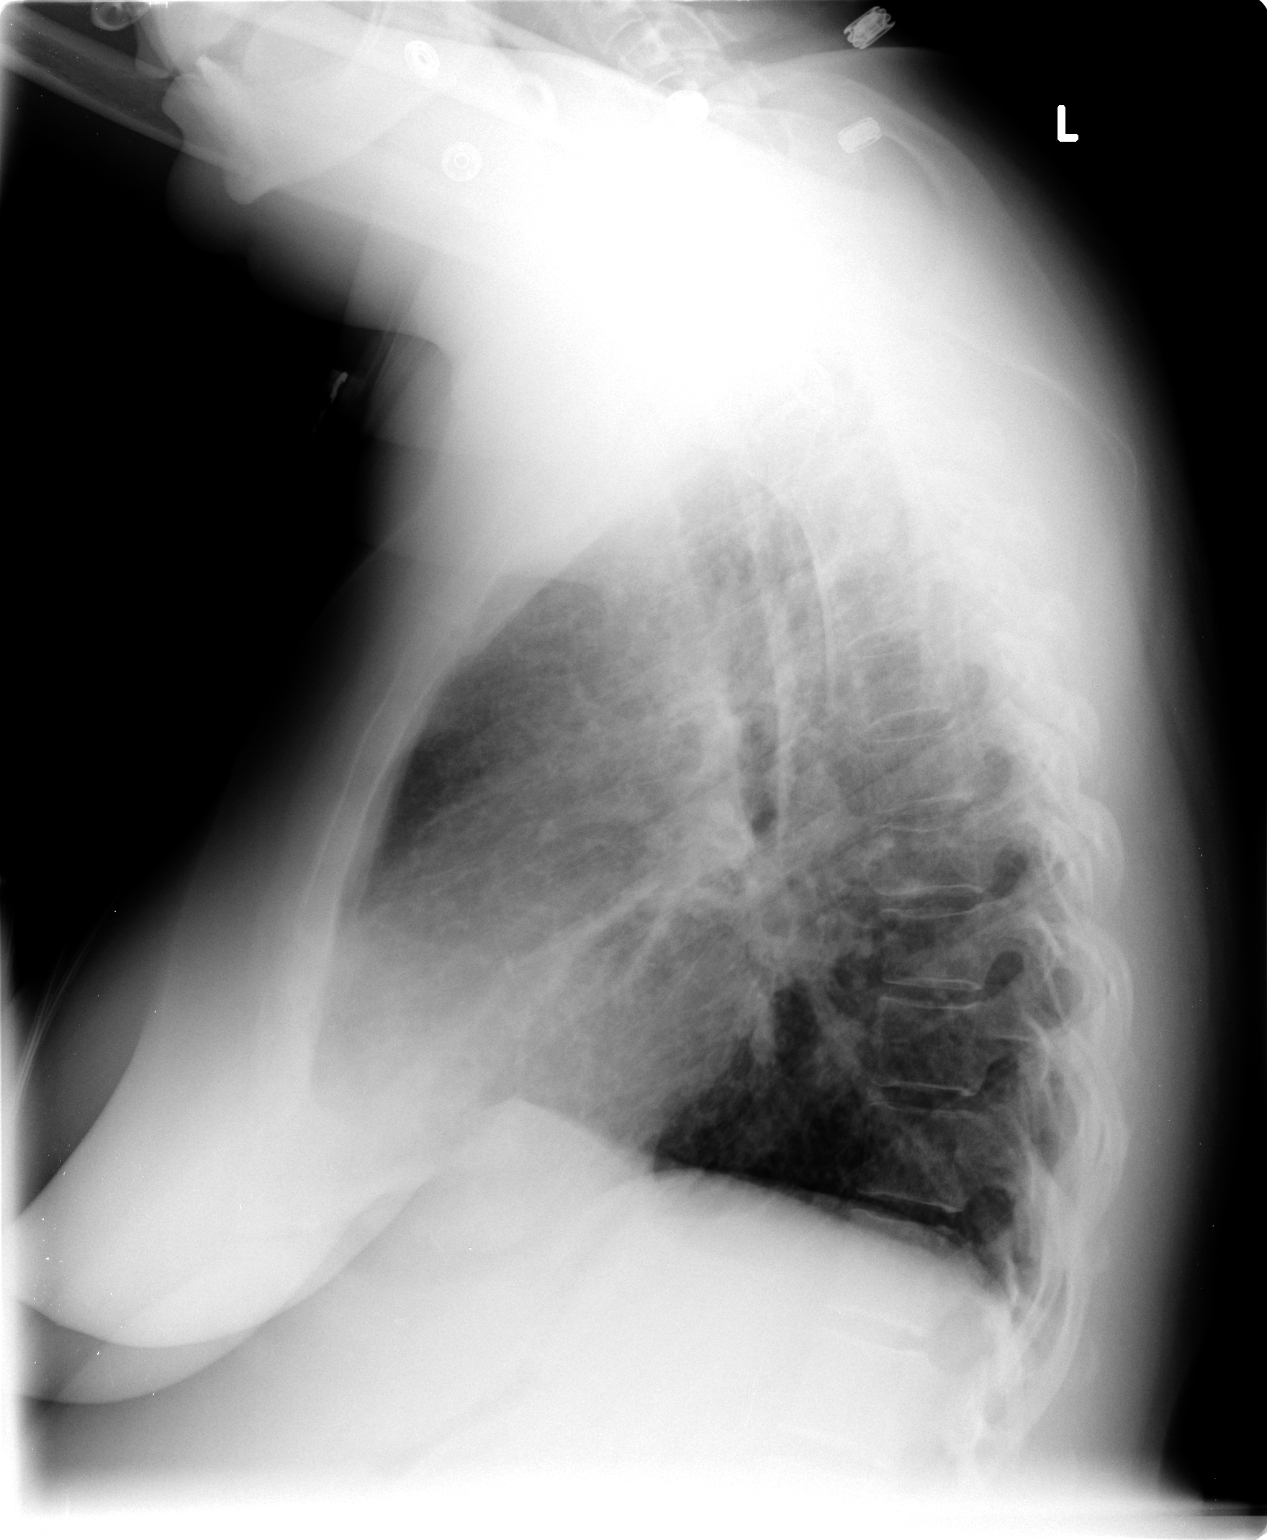

[2 of 2 positions shown; findings below may reference images not displayed]

FINDINGS: Heart is enlarged.  Mild hyperinflation of the lungs.
Right basilar atelectasis.  No effusions.  No acute bony
abnormality.
IMPRESSION: Cardiomegaly, hyperinflation.  Right base atelectasis.

## 2012-11-18 ENCOUNTER — Other Ambulatory Visit: Payer: Self-pay

## 2012-11-18 ENCOUNTER — Emergency Department (HOSPITAL_COMMUNITY)
Admission: EM | Admit: 2012-11-18 | Discharge: 2012-11-19 | Disposition: A | Discharge status: Home / Self Care | Payer: Medicaid Other | Attending: Emergency Medicine | Admitting: Emergency Medicine

## 2012-11-18 ENCOUNTER — Emergency Department (HOSPITAL_COMMUNITY): Payer: Medicaid Other

## 2012-11-18 ENCOUNTER — Encounter (HOSPITAL_COMMUNITY): Payer: Self-pay | Admitting: *Deleted

## 2012-11-18 DIAGNOSIS — F1910 Other psychoactive substance abuse, uncomplicated: Secondary | ICD-10-CM | POA: Insufficient documentation

## 2012-11-18 DIAGNOSIS — E119 Type 2 diabetes mellitus without complications: Secondary | ICD-10-CM | POA: Insufficient documentation

## 2012-11-18 DIAGNOSIS — Z8709 Personal history of other diseases of the respiratory system: Secondary | ICD-10-CM | POA: Insufficient documentation

## 2012-11-18 DIAGNOSIS — R05 Cough: Secondary | ICD-10-CM | POA: Insufficient documentation

## 2012-11-18 DIAGNOSIS — Z8701 Personal history of pneumonia (recurrent): Secondary | ICD-10-CM | POA: Insufficient documentation

## 2012-11-18 DIAGNOSIS — R059 Cough, unspecified: Secondary | ICD-10-CM | POA: Insufficient documentation

## 2012-11-18 DIAGNOSIS — Z87891 Personal history of nicotine dependence: Secondary | ICD-10-CM | POA: Insufficient documentation

## 2012-11-18 DIAGNOSIS — B349 Viral infection, unspecified: Secondary | ICD-10-CM

## 2012-11-18 DIAGNOSIS — Z8679 Personal history of other diseases of the circulatory system: Secondary | ICD-10-CM | POA: Insufficient documentation

## 2012-11-18 DIAGNOSIS — F1410 Cocaine abuse, uncomplicated: Secondary | ICD-10-CM | POA: Insufficient documentation

## 2012-11-18 DIAGNOSIS — R079 Chest pain, unspecified: Secondary | ICD-10-CM | POA: Insufficient documentation

## 2012-11-18 DIAGNOSIS — Z9981 Dependence on supplemental oxygen: Secondary | ICD-10-CM | POA: Insufficient documentation

## 2012-11-18 DIAGNOSIS — G8929 Other chronic pain: Secondary | ICD-10-CM | POA: Insufficient documentation

## 2012-11-18 DIAGNOSIS — B9789 Other viral agents as the cause of diseases classified elsewhere: Secondary | ICD-10-CM | POA: Insufficient documentation

## 2012-11-18 DIAGNOSIS — Z79899 Other long term (current) drug therapy: Secondary | ICD-10-CM | POA: Insufficient documentation

## 2012-11-18 DIAGNOSIS — J441 Chronic obstructive pulmonary disease with (acute) exacerbation: Secondary | ICD-10-CM

## 2012-11-18 DIAGNOSIS — M549 Dorsalgia, unspecified: Secondary | ICD-10-CM | POA: Insufficient documentation

## 2012-11-18 DIAGNOSIS — J45909 Unspecified asthma, uncomplicated: Secondary | ICD-10-CM | POA: Insufficient documentation

## 2012-11-18 DIAGNOSIS — R110 Nausea: Secondary | ICD-10-CM | POA: Insufficient documentation

## 2012-11-18 DIAGNOSIS — Z9851 Tubal ligation status: Secondary | ICD-10-CM | POA: Insufficient documentation

## 2012-11-18 DIAGNOSIS — R197 Diarrhea, unspecified: Secondary | ICD-10-CM

## 2012-11-18 DIAGNOSIS — F319 Bipolar disorder, unspecified: Secondary | ICD-10-CM | POA: Insufficient documentation

## 2012-11-18 DIAGNOSIS — F1210 Cannabis abuse, uncomplicated: Secondary | ICD-10-CM | POA: Insufficient documentation

## 2012-11-18 MED ORDER — AZITHROMYCIN 250 MG PO TABS
500.0000 mg | ORAL_TABLET | Freq: Once | ORAL | Status: AC
  Administered 2012-11-18: 500 mg via ORAL
  Filled 2012-11-18: qty 2

## 2012-11-18 MED ORDER — ALBUTEROL SULFATE (5 MG/ML) 0.5% IN NEBU
5.0000 mg | INHALATION_SOLUTION | RESPIRATORY_TRACT | Status: DC
  Administered 2012-11-18: 5 mg via RESPIRATORY_TRACT
  Filled 2012-11-18: qty 1

## 2012-11-18 MED ORDER — ONDANSETRON 4 MG PO TBDP
4.0000 mg | ORAL_TABLET | Freq: Once | ORAL | Status: AC
  Administered 2012-11-18: 4 mg via ORAL
  Filled 2012-11-18: qty 1

## 2012-11-18 MED ORDER — IPRATROPIUM BROMIDE 0.02 % IN SOLN
0.5000 mg | RESPIRATORY_TRACT | Status: DC
  Administered 2012-11-18: 0.5 mg via RESPIRATORY_TRACT
  Filled 2012-11-18: qty 2.5

## 2012-11-18 MED ORDER — PREDNISONE 50 MG PO TABS
60.0000 mg | ORAL_TABLET | Freq: Once | ORAL | Status: AC
  Administered 2012-11-18: 60 mg via ORAL
  Filled 2012-11-18: qty 1

## 2012-11-18 MED ORDER — OXYMETAZOLINE HCL 0.05 % NA SOLN
1.0000 | Freq: Once | NASAL | Status: AC
  Administered 2012-11-18: 1 via NASAL
  Filled 2012-11-18: qty 15

## 2012-11-18 NOTE — ED Provider Notes (Signed)
History   This chart was scribed for Jones Skene, MD, by Frederik Pear, ER scribe. The patient was seen in room [facility] and the patient's care was started at 2251    CSN: [id number]  Arrival date & time 11/18/12  2023   First MD Initiated Contact with Patient 11/18/12 2251      Chief Complaint  Patient presents with  . Nasal Congestion  . Diarrhea    (Consider location/radiation/quality/duration/timing/severity/associated sxs/prior treatment) HPI  [name] is a 46 y.o. female with a h/o of COPD who presents to the Emergency Department complaining of an intermittent, gradually worsening productive cough with yellow and green sputum and associated diarrhea including 4x today and 3x last night, nausea, congestion, sinus and chest pain that is worse with a lot of movement and coughing. She denies having taken her temperature at home or any associated emesis, abdominal pain, extremity edema, rash, or ear pain. She states that she is on 2 L of O2 regularly, but has changed 3 L with her recent symptoms. She reports that her daughter had emesis and diarrhea about a week and a half ago. She states that she received the pneumonia vaccine in 2011.  Past Medical History  Diagnosis Date  . Migraine headache   . On home O2   . COPD (chronic obstructive pulmonary disease)   . Bipolar disorder   . Chronic back pain   . Hyperglycemia, drug-induced     steroid induced hyperglycemia  . ARDS (adult respiratory distress syndrome)     Jan 2011  . Diabetes mellitus   . Pneumonia   . Angina   . Asthma   . Shortness of breath   . Recurrent upper respiratory infection (URI)     Past Surgical History  Procedure Date  . Tubal ligation   . Uterine ablasion   . C-section   . Tracheostomy     decannulated 12/2009    Family History  Problem Relation Age of Onset  . Coronary artery disease Brother   . Diabetes Other   . Cancer Other   . Hypertension Other     History  Substance  Use Topics  . Smoking status: Former Smoker -- 1.5 packs/day for 33 years    Types: Cigarettes    Quit date: 07/30/2011  . Smokeless tobacco: Not on file     Comment: smoking 1-2 cigarettes every other day  . Alcohol Use: No    OB History    Grav Para Term Preterm Abortions TAB SAB Ect Mult Living                  Review of Systems At least 10pt or greater review of systems completed and are negative except where specified in the HPI. Allergies  Penicillins and Morphine  Home Medications   Current Outpatient Rx  Name  Route  Sig  Dispense  Refill  . ALBUTEROL SULFATE HFA 108 (90 BASE) MCG/ACT IN AERS   Inhalation   Inhale 2 puffs into the lungs every 6 (six) hours as needed. For shortness of breATH         . ALBUTEROL SULFATE (2.5 MG/3ML) 0.083% IN NEBU   Nebulization   Take 2.5 mg by nebulization every 4 (four) hours as needed. For shortness of breath          . BUDESONIDE-FORMOTEROL FUMARATE 160-4.5 MCG/ACT IN AERO   Inhalation   Inhale 2 puffs into the lungs 2 (two) times daily.         [name]  CHOLINE FENOFIBRATE 135 MG PO CPDR   Oral   Take 135 mg by mouth every morning.          [name] FIRST-DUKES MOUTHWASH MT   Mouth/Throat   Use as directed 5 mLs in the mouth or throat daily as needed. FOR THRUSH         . DOCUSATE SODIUM 100 MG PO CAPS   Oral   Take 200 mg by mouth every morning.         [name] FLUCONAZOLE 200 MG PO TABS      Take one tablet upon completion of your antibiotics   1 tablet   0   . FLUTICASONE PROPIONATE 50 MCG/ACT NA SUSP   Nasal   Place 2 sprays into the nose daily.         . OXYCODONE-ACETAMINOPHEN 10-325 MG PO TABS   Oral   Take 1 tablet by mouth every 4 (four) hours as needed. For pain   30 tablet   0   . PROMETHAZINE HCL 25 MG PO TABS   Oral   Take 25 mg by mouth every 6 (six) hours as needed. For nausea         . SIMETHICONE 125 MG PO CAPS   Oral   Take 2 capsules by mouth every morning.         . SUMATRIPTAN  SUCCINATE 25 MG PO TABS   Oral   Take 25 mg by mouth every 2 (two) hours as needed.         [name] TIOTROPIUM BROMIDE MONOHYDRATE 18 MCG IN CAPS   Inhalation   Place 18 mcg into inhaler and inhale every morning.           BP 135/89  Pulse 102  Temp 98.4 F (36.9 C) (Oral)  Resp 22  Ht 5\' 1"  (1.549 m)  Wt 179 lb (81.194 kg)  BMI 33.82 kg/m2  SpO2 99%  Physical Exam  Nursing notes reviewed.  Electronic medical record reviewed. VITAL SIGNS:   Filed Vitals:   11/18/12 2034 11/18/12 2320 11/19/12 0051  BP: 135/89    Pulse: 102  88  Temp: 98.4 F (36.9 C)  98.6 F (37 C)  TempSrc: Oral  Oral  Resp: 22  22  Height: 5\' 1"  (1.549 m)    Weight: 179 lb (81.194 kg)    SpO2: 99% 95% 96%   CONSTITUTIONAL: Awake, oriented, appears non-toxic HENT: Atraumatic, normocephalic, oral mucosa pink and moist, airway patent. Nares patent without drainage. External ears normal. EYES: Conjunctiva clear, EOMI, PERRLA NECK: Trachea midline, non-tender, supple CARDIOVASCULAR: Normal heart rate, Normal rhythm PULMONARY/CHEST: Fair air movement, no retractions, wheezing throughout ABDOMINAL: Non-distended, soft, non-tender - no rebound or guarding.  BS normal. NEUROLOGIC: Non-focal, moving all four extremities, no gross sensory or motor deficits. EXTREMITIES: No clubbing, cyanosis, or edema SKIN: Warm, Dry, No erythema, No rash  ED Course  Procedures (including critical care time)  Date: 11/19/2012  Rate: 97  Rhythm: normal sinus rhythm  QRS Axis: normal  Intervals: normal  ST/T Wave abnormalities: normal  Conduction Disutrbances: Incomplete right bundle branch block  Narrative Interpretation: unremarkable - no significant change from last EKG 02/03/2012 nonischemic EKG  DIAGNOSTIC STUDIES: Oxygen Saturation is 99% on 3 L O2, normal by my interpretation.    COORDINATION OF CARE:  23:00- Discussed planned course of treatment with the patient, including a chest X-ray, EKG, and breathing  treatment, who is agreeable at this time.  23:15- oxymetazoline (Afrin) 0.05% nasal  spray 1 spray-Once, azithromycin (Zithromax) tablet 500 mg- Once, prednisone (Deltasone) tablet 60 mg-Once, ondansetron (Zofran-ODT) disintegrating tablet 4 mg-Once.   Labs Reviewed - No data to display Dg Chest 2 View  11/18/2012  *RADIOLOGY REPORT*  Clinical Data:  nasal congestion and diarrhea  CHEST - 2 VIEW  Comparison: 09/20/2012  Findings: Normal heart size.  No pleural effusion or edema. Chronic interstitial coarsening is again noted and appears similar to previous exam.  Review of the visualized osseous structures is unremarkable.  IMPRESSION:  1.  No acute cardiopulmonary abnormalities. 2.  Chronic interstitial coarsening.   Original Report Authenticated By: Signa Kell, M.D.      1. COPD exacerbation   2. Diarrhea   3. Viral syndrome     Medications  fluticasone (FLONASE) 50 MCG/ACT nasal spray (not administered)  albuterol (PROAIR HFA) 108 (90 BASE) MCG/ACT inhaler (not administered)  Simethicone (GAS-X EXTRA STRENGTH) 125 MG CAPS (not administered)  docusate sodium (COLACE) 100 MG capsule (not administered)  Diphenhyd-Hydrocort-Nystatin (FIRST-DUKES MOUTHWASH MT) (not administered)  tiotropium (SPIRIVA) 18 MCG inhalation capsule (not administered)  predniSONE (DELTASONE) 20 MG tablet (not administered)  azithromycin (ZITHROMAX) 250 MG tablet (not administered)  loperamide (IMODIUM) 2 MG capsule (not administered)  oxymetazoline (AFRIN) 0.05 % nasal spray 1 spray (1 spray Each Nare Given 11/18/12 2310)  azithromycin (ZITHROMAX) tablet 500 mg (500 mg Oral Given 11/18/12 2310)  predniSONE (DELTASONE) tablet 60 mg (60 mg Oral Given 11/18/12 2309)  ondansetron (ZOFRAN-ODT) disintegrating tablet 4 mg (4 mg Oral Given 11/18/12 2309)  oxyCODONE-acetaminophen (PERCOCET/ROXICET) 5-325 MG per tablet 2 tablet (2 tablet Oral Given 11/19/12 0026)  ibuprofen (ADVIL,MOTRIN) tablet 400 mg (400 mg Oral  Given 11/19/12 0026)      MDM  [name] is a 46 y.o. female presenting with mild COPD exacerbation, having viral URI sx as well.  CXR shows no PNA. Pt non toxic, afebrile.  Considered ACS, PE - but sx most c/w with URI and mild copd exacerbation.  EKG unremarkable, CXR shows chronic changes.  I personally performed the services described in this documentation, which was scribed in my presence. The recorded information has been reviewed and is accurate. Jones Skene, M.D.          Jones Skene, MD 11/21/12 1610

## 2012-11-18 NOTE — ED Notes (Signed)
Pt c/o coughing up yellowish/green sputum and having diarrhea.

## 2012-11-19 MED ORDER — OXYCODONE-ACETAMINOPHEN 5-325 MG PO TABS
2.0000 | ORAL_TABLET | Freq: Once | ORAL | Status: AC
  Administered 2012-11-19: 2 via ORAL
  Filled 2012-11-19: qty 2

## 2012-11-19 MED ORDER — LOPERAMIDE HCL 2 MG PO CAPS: 2.0000 mg | ORAL_CAPSULE | Freq: Four times a day (QID) | ORAL | Status: DC | PRN

## 2012-11-19 MED ORDER — IBUPROFEN 400 MG PO TABS
400.0000 mg | ORAL_TABLET | Freq: Once | ORAL | Status: AC
  Administered 2012-11-19: 400 mg via ORAL
  Filled 2012-11-19: qty 1

## 2012-11-19 MED ORDER — PREDNISONE 20 MG PO TABS: 60.0000 mg | ORAL_TABLET | Freq: Every day | ORAL | Status: DC

## 2012-11-19 MED ORDER — AZITHROMYCIN 250 MG PO TABS: 250.0000 mg | ORAL_TABLET | Freq: Every day | ORAL | Status: DC

## 2012-11-19 NOTE — ED Notes (Signed)
Discharge instructions reviewed with pt, questions answered. Pt verbalized understanding.  

## 2012-11-19 NOTE — ED Notes (Signed)
MD at bedside to reassess pt

## 2012-11-19 NOTE — ED Notes (Signed)
Pt stated she is breathing easier but she either wants pain medicine for her legs or wants to be discharged.

## 2012-11-30 ENCOUNTER — Encounter (HOSPITAL_COMMUNITY): Payer: Self-pay | Admitting: Adult Health

## 2012-11-30 ENCOUNTER — Emergency Department (HOSPITAL_COMMUNITY)
Admission: EM | Admit: 2012-11-30 | Discharge: 2012-12-01 | Disposition: A | Discharge status: Home / Self Care | Payer: Medicaid Other | Attending: Emergency Medicine | Admitting: Emergency Medicine

## 2012-11-30 ENCOUNTER — Emergency Department (HOSPITAL_COMMUNITY): Payer: Medicaid Other

## 2012-11-30 DIAGNOSIS — F319 Bipolar disorder, unspecified: Secondary | ICD-10-CM | POA: Insufficient documentation

## 2012-11-30 DIAGNOSIS — R059 Cough, unspecified: Secondary | ICD-10-CM | POA: Insufficient documentation

## 2012-11-30 DIAGNOSIS — E119 Type 2 diabetes mellitus without complications: Secondary | ICD-10-CM | POA: Insufficient documentation

## 2012-11-30 DIAGNOSIS — R0602 Shortness of breath: Secondary | ICD-10-CM | POA: Insufficient documentation

## 2012-11-30 DIAGNOSIS — J449 Chronic obstructive pulmonary disease, unspecified: Secondary | ICD-10-CM

## 2012-11-30 DIAGNOSIS — Z8709 Personal history of other diseases of the respiratory system: Secondary | ICD-10-CM | POA: Insufficient documentation

## 2012-11-30 DIAGNOSIS — R197 Diarrhea, unspecified: Secondary | ICD-10-CM | POA: Insufficient documentation

## 2012-11-30 DIAGNOSIS — Z79899 Other long term (current) drug therapy: Secondary | ICD-10-CM | POA: Insufficient documentation

## 2012-11-30 DIAGNOSIS — R062 Wheezing: Secondary | ICD-10-CM | POA: Insufficient documentation

## 2012-11-30 DIAGNOSIS — Z8679 Personal history of other diseases of the circulatory system: Secondary | ICD-10-CM | POA: Insufficient documentation

## 2012-11-30 DIAGNOSIS — R05 Cough: Secondary | ICD-10-CM | POA: Insufficient documentation

## 2012-11-30 DIAGNOSIS — R509 Fever, unspecified: Secondary | ICD-10-CM | POA: Insufficient documentation

## 2012-11-30 DIAGNOSIS — M549 Dorsalgia, unspecified: Secondary | ICD-10-CM | POA: Insufficient documentation

## 2012-11-30 DIAGNOSIS — R0789 Other chest pain: Secondary | ICD-10-CM | POA: Insufficient documentation

## 2012-11-30 DIAGNOSIS — J45909 Unspecified asthma, uncomplicated: Secondary | ICD-10-CM | POA: Insufficient documentation

## 2012-11-30 DIAGNOSIS — Z8701 Personal history of pneumonia (recurrent): Secondary | ICD-10-CM | POA: Insufficient documentation

## 2012-11-30 DIAGNOSIS — J4489 Other specified chronic obstructive pulmonary disease: Secondary | ICD-10-CM | POA: Insufficient documentation

## 2012-11-30 DIAGNOSIS — G8929 Other chronic pain: Secondary | ICD-10-CM | POA: Insufficient documentation

## 2012-11-30 DIAGNOSIS — Z87891 Personal history of nicotine dependence: Secondary | ICD-10-CM | POA: Insufficient documentation

## 2012-11-30 LAB — CBC
HCT: 36 % (ref 36.0–46.0)
Hemoglobin: 11.2 g/dL — ABNORMAL LOW (ref 12.0–15.0)
MCH: 26.5 pg (ref 26.0–34.0)
MCHC: 31.1 g/dL (ref 30.0–36.0)

## 2012-11-30 LAB — BASIC METABOLIC PANEL
BUN: 6 mg/dL (ref 6–23)
CO2: 31 mEq/L (ref 19–32)
Calcium: 9.3 mg/dL (ref 8.4–10.5)
GFR calc non Af Amer: >90 mL/min (ref >90)
Glucose, Bld: 104 mg/dL — ABNORMAL HIGH (ref 70–99)
Potassium: 3.6 mEq/L (ref 3.5–5.1)

## 2012-11-30 MED ORDER — PREDNISONE 50 MG PO TABS: ORAL_TABLET | ORAL | Status: DC

## 2012-11-30 MED ORDER — ONDANSETRON HCL 4 MG/2ML IJ SOLN
4.0000 mg | Freq: Once | INTRAMUSCULAR | Status: AC
  Administered 2012-11-30: 4 mg via INTRAVENOUS
  Filled 2012-11-30: qty 2

## 2012-11-30 MED ORDER — ALBUTEROL SULFATE (5 MG/ML) 0.5% IN NEBU
5.0000 mg | INHALATION_SOLUTION | Freq: Once | RESPIRATORY_TRACT | Status: AC
  Administered 2012-11-30: 5 mg via RESPIRATORY_TRACT
  Filled 2012-11-30: qty 40
  Filled 2012-11-30: qty 20

## 2012-11-30 MED ORDER — ALBUTEROL SULFATE (5 MG/ML) 0.5% IN NEBU
5.0000 mg | INHALATION_SOLUTION | Freq: Once | RESPIRATORY_TRACT | Status: AC
  Administered 2012-11-30: 5 mg via RESPIRATORY_TRACT
  Filled 2012-11-30: qty 40

## 2012-11-30 MED ORDER — HYDROMORPHONE HCL PF 1 MG/ML IJ SOLN
1.0000 mg | Freq: Once | INTRAMUSCULAR | Status: AC
  Administered 2012-11-30: 1 mg via INTRAVENOUS
  Filled 2012-11-30: qty 1

## 2012-11-30 MED ORDER — PREDNISONE 20 MG PO TABS
60.0000 mg | ORAL_TABLET | Freq: Once | ORAL | Status: AC
  Administered 2012-11-30: 60 mg via ORAL
  Filled 2012-11-30: qty 3

## 2012-11-30 MED ORDER — IPRATROPIUM BROMIDE 0.02 % IN SOLN
0.5000 mg | Freq: Once | RESPIRATORY_TRACT | Status: AC
  Administered 2012-11-30: 0.5 mg via RESPIRATORY_TRACT
  Filled 2012-11-30: qty 2.5

## 2012-11-30 NOTE — ED Provider Notes (Signed)
History     CSN: [id number]  Arrival date & time 11/30/12  1916   First MD Initiated Contact with Patient 11/30/12 2105      Chief Complaint  Patient presents with  . Weakness     Patient is a 47 y.o. female presenting with shortness of breath. The history is provided by the patient.  Shortness of Breath  The current episode started 5 to 7 days ago. The onset was gradual. The problem occurs frequently. The problem has been gradually worsening. The problem is moderate. Nothing relieves the symptoms. Nothing aggravates the symptoms. Associated symptoms include chest pain, a fever, cough, shortness of breath and wheezing.  pt reports fever/congestion/cough with greenish sputum for over a week.  She reports she was seen before the new year at [facility] for similar illness, she improved but now symptoms returned No vomiting but does report diarrhea She reports chest wall pain from cough She reports fatigue and generalized weakness She report she wears home O2 at baseline She is still smoking  Past Medical History  Diagnosis Date  . Migraine headache   . On home O2   . COPD (chronic obstructive pulmonary disease)   . Bipolar disorder   . Chronic back pain   . Hyperglycemia, drug-induced     steroid induced hyperglycemia  . ARDS (adult respiratory distress syndrome)     Jan 2011  . Diabetes mellitus   . Pneumonia   . Angina   . Asthma   . Shortness of breath   . Recurrent upper respiratory infection (URI)     Past Surgical History  Procedure Date  . Tubal ligation   . Uterine ablasion   . C-section   . Tracheostomy     decannulated 12/2009    Family History  Problem Relation Age of Onset  . Coronary artery disease Brother   . Diabetes Other   . Cancer Other   . Hypertension Other     History  Substance Use Topics  . Smoking status: Former Smoker -- 1.5 packs/day for 33 years    Types: Cigarettes    Quit date: 07/30/2011  . Smokeless tobacco: Not on file   Comment: smoking 1-2 cigarettes every other day  . Alcohol Use: No    OB History    Grav Para Term Preterm Abortions TAB SAB Ect Mult Living                  Review of Systems  Constitutional: Positive for fever and fatigue.  Respiratory: Positive for cough, shortness of breath and wheezing.   Cardiovascular: Positive for chest pain.  Gastrointestinal: Positive for diarrhea.  Neurological: Positive for weakness.  Psychiatric/Behavioral: Negative for agitation.  All other systems reviewed and are negative.    Allergies  Penicillins and Morphine  Home Medications   Current Outpatient Rx  Name  Route  Sig  Dispense  Refill  . ALBUTEROL SULFATE HFA 108 (90 BASE) MCG/ACT IN AERS   Inhalation   Inhale 2 puffs into the lungs every 6 (six) hours as needed. For shortness of breATH         . ALBUTEROL SULFATE (2.5 MG/3ML) 0.083% IN NEBU   Nebulization   Take 2.5 mg by nebulization every 4 (four) hours as needed. For shortness of breath          . BUDESONIDE-FORMOTEROL FUMARATE 160-4.5 MCG/ACT IN AERO   Inhalation   Inhale 2 puffs into the lungs 2 (two) times daily.         [name]  CHOLINE FENOFIBRATE 135 MG PO CPDR   Oral   Take 135 mg by mouth every morning.          [name] FIRST-DUKES MOUTHWASH MT   Mouth/Throat   Use as directed 5 mLs in the mouth or throat daily as needed. FOR THRUSH         . DOCUSATE SODIUM 100 MG PO CAPS   Oral   Take 200 mg by mouth every morning.         [name] FLUTICASONE PROPIONATE 50 MCG/ACT NA SUSP   Nasal   Place 2 sprays into the nose daily.         . OXYCODONE-ACETAMINOPHEN 10-325 MG PO TABS   Oral   Take 1 tablet by mouth every 4 (four) hours as needed. For pain   30 tablet   0   . PROMETHAZINE HCL 25 MG PO TABS   Oral   Take 25 mg by mouth every 6 (six) hours as needed. For nausea         . SIMETHICONE 125 MG PO CAPS   Oral   Take 2 capsules by mouth every morning.         . SUMATRIPTAN SUCCINATE 25 MG PO TABS   Oral    Take 25 mg by mouth every 2 (two) hours as needed.         [name] TIOTROPIUM BROMIDE MONOHYDRATE 18 MCG IN CAPS   Inhalation   Place 18 mcg into inhaler and inhale every morning.           BP 129/89  Pulse 91  Temp 98.5 F (36.9 C) (Oral)  Resp 15  SpO2 99%  Physical Exam CONSTITUTIONAL: Well developed/well nourished HEAD AND FACE: Normocephalic/atraumatic EYES: EOMI/PERRL ENMT: Mucous membranes moist, nasal congestion NECK: supple no meningeal signs SPINE:entire spine nontender CV: S1/S2 noted, no murmurs/rubs/gallops noted Chest-  Tender to palpation, no crepitance noted LUNGS: coarse wheezing noted bilaterally, but no distress noted.   ABDOMEN: soft, nontender, no rebound or guarding GU:no cva tenderness NEURO: Pt is awake/alert, moves all extremitiesx4 EXTREMITIES: pulses normal, full ROM, no edema noted. No calf tenderness SKIN: warm, color normal PSYCH: no abnormalities of mood noted  ED Course  Procedures  Labs Reviewed  CBC - Abnormal; Notable for the following:    Hemoglobin 11.2 (*)     Platelets 410 (*)     All other components within normal limits  BASIC METABOLIC PANEL - Abnormal; Notable for the following:    Glucose, Bld 104 (*)     All other components within normal limits  POCT I-STAT TROPONIN I   Dg Chest 2 View (if Patient Has Fever And/or Copd)  11/30/2012  *RADIOLOGY REPORT*  Clinical Data: Fever, chest pain  CHEST - 2 VIEW  Comparison: 11/18/2012  Findings: Cardiomediastinal silhouette is stable.  No acute infiltrate or pulmonary edema.  Stable chronic mild interstitial prominence.  Central mild bronchitic changes.  IMPRESSION: No acute infiltrate or pulmonary edema.  Central mild bronchitic changes.  Stable chronic mild interstitial prominence.   Original Report Authenticated By: Natasha Mead, M.D.    10:45 PM Will treat with nebs and reassess 11:51 PM Pt improved.  Vitals appropriate.  She is ambulatory and no distress.  Her lung sounds are  improved.  I doubt PE/ACS/CHF at this time.  Feel she is safe for d/c.  Suspect viral cause.  Defer antibiotics. Will start another short course of prednisone.  Pt agreeable with plan   MDM  Nursing notes including past medical history and social history reviewed and considered in documentation xrays reviewed and considered Labs/vital reviewed and considered Previous records reviewed and considered - recent ED visit reviewed        Date: 11/30/2012  Rate: 98  Rhythm: normal sinus rhythm  QRS Axis: normal  Intervals: normal  ST/T Wave abnormalities: normal  Conduction Disutrbances:nonspecific intraventricular conduction delay  Narrative Interpretation:   Old EKG Reviewed: unchanged    Joya Gaskins, MD 11/30/12 2355

## 2012-11-30 NOTE — ED Notes (Signed)
Pt finished neb. Treatment. Reports she feels about the same.

## 2012-11-30 NOTE — ED Notes (Addendum)
Pt reports Weakness, Fever, SOB, productive green/yellow cough for one week, headache and chest pain. Chest pain is worse with inspiration. . Uses Oxygen 3 L all times, HX COPD/Emphysema. Bilateral expiratory wheezes and rhonchi. She states, "I feel like I am going to pass out. I am real lightheaded."

## 2012-11-30 NOTE — ED Notes (Addendum)
Pt c/o fever/cough/generalized tiredness X 1.5 week ago. Pt reports she went to [facility] and was seen there but feels like she is just getting sicker. Pt in nad, pt speaking in full sentences. Pt reports she had COPD and wear oxygen at home but is feeling more SOB than normal.

## 2012-11-30 NOTE — ED Notes (Signed)
Pt c/o nausea.  

## 2012-11-30 NOTE — ED Notes (Signed)
Pt ambulated well. Pt denies any pain, nausea, dizziness or weakness.

## 2012-12-14 ENCOUNTER — Encounter (INDEPENDENT_AMBULATORY_CARE_PROVIDER_SITE_OTHER): Payer: Self-pay | Admitting: *Deleted

## 2012-12-21 ENCOUNTER — Ambulatory Visit (INDEPENDENT_AMBULATORY_CARE_PROVIDER_SITE_OTHER): Payer: Medicaid Other | Admitting: Internal Medicine

## 2012-12-21 ENCOUNTER — Encounter (INDEPENDENT_AMBULATORY_CARE_PROVIDER_SITE_OTHER): Payer: Self-pay | Admitting: Internal Medicine

## 2012-12-21 VITALS — BP 110/60 | HR 76 | Temp 99.5°F | Ht 61.0 in | Wt 168.4 lb

## 2012-12-21 DIAGNOSIS — K219 Gastro-esophageal reflux disease without esophagitis: Secondary | ICD-10-CM

## 2012-12-21 DIAGNOSIS — K5900 Constipation, unspecified: Secondary | ICD-10-CM | POA: Insufficient documentation

## 2012-12-21 HISTORY — DX: Gastro-esophageal reflux disease without esophagitis: K21.9

## 2012-12-21 MED ORDER — PANTOPRAZOLE SODIUM 40 MG PO TBEC: 40.0000 mg | DELAYED_RELEASE_TABLET | Freq: Two times a day (BID) | ORAL | Status: DC

## 2012-12-21 NOTE — Patient Instructions (Addendum)
Protonix 40mg  BID x 30 days then one a day. Linzess daily. OV in 1 month

## 2012-12-21 NOTE — Progress Notes (Addendum)
Subjective:     Patient ID: [name], female   DOB: [birth year], 47 y.o.   MRN: [id number]  HPI Referred to our office by Dr. Juanetta Gosling for epigastric pain and nausea. She tells me she has been nauseated for months. She tells me she has a lot of gas and feels bloated. She takes OTC Gas X.  She also says she has problems with constipation. She has a BM about every 4 days.  She takes daily stool softner for her constipation.  She will take 4 stool softners to have a BM.  Appetite is okay.  She may have lost about 8 pounds since her last hospital visit in December of 2013. Hx of COPD and is on 02 at 3l.  She continues to smoke. Patient has multiple, chronic health problems.  Review of Systems see hpi Current Outpatient Prescriptions  Medication Sig Dispense Refill  . albuterol (PROAIR HFA) 108 (90 BASE) MCG/ACT inhaler Inhale 2 puffs into the lungs every 6 (six) hours as needed. For shortness of breATH      . albuterol (PROVENTIL) (2.5 MG/3ML) 0.083% nebulizer solution Take 2.5 mg by nebulization every 4 (four) hours as needed. For shortness of breath       . budesonide-formoterol (SYMBICORT) 160-4.5 MCG/ACT inhaler Inhale 2 puffs into the lungs 2 (two) times daily.      . Choline Fenofibrate (TRILIPIX) 135 MG capsule Take 135 mg by mouth every morning.       . Diphenhyd-Hydrocort-Nystatin (FIRST-DUKES MOUTHWASH MT) Use as directed 5 mLs in the mouth or throat daily as needed. FOR THRUSH      . docusate sodium (COLACE) 100 MG capsule Take 200 mg by mouth every morning.      . fluticasone (FLONASE) 50 MCG/ACT nasal spray Place 2 sprays into the nose daily.      [name] gabapentin (NEURONTIN) 300 MG capsule Take 300 mg by mouth at bedtime.      [name] lisinopril (PRINIVIL,ZESTRIL) 20 MG tablet Take 20 mg by mouth daily.      [name] oxyCODONE (OXYCONTIN) 10 MG 12 hr tablet Take 10 mg by mouth. Four times a day      . oxyCODONE-acetaminophen (PERCOCET) 10-325 MG per tablet Take 1 tablet by mouth every 4 (four)  hours as needed. For pain  30 tablet  0  . promethazine (PHENERGAN) 25 MG tablet Take 25 mg by mouth every 6 (six) hours as needed. For nausea      . Simethicone (GAS-X EXTRA STRENGTH) 125 MG CAPS Take 2 capsules by mouth every morning.      . SUMAtriptan (IMITREX) 25 MG tablet Take 25 mg by mouth every 2 (two) hours as needed.      . tiotropium (SPIRIVA) 18 MCG inhalation capsule Place 18 mcg into inhaler and inhale every morning.      . predniSONE (DELTASONE) 50 MG tablet One tablet PO daily for 4 days  4 tablet  0  . [DISCONTINUED] ARIPiprazole (ABILIFY) 5 MG tablet Take 5 mg by mouth daily.        . [DISCONTINUED] venlafaxine (EFFEXOR) 75 MG tablet Take 75 mg by mouth daily.         Past Medical History  Diagnosis Date  . Migraine headache   . On home O2   . COPD (chronic obstructive pulmonary disease)   . Chronic back pain   . Hyperglycemia, drug-induced     steroid induced hyperglycemia  . ARDS (adult respiratory distress syndrome)  Jan 2011  . Diabetes mellitus   . Pneumonia   . Angina   . Asthma   . Shortness of breath   . Recurrent upper respiratory infection (URI)    Past Surgical History  Procedure Date  . Tubal ligation   . Uterine ablasion   . C-section   . Tracheostomy     decannulated 12/2009   Allergies  Allergen Reactions  . Penicillins Other (See Comments)    convulsions  . Morphine Nausea Only         Objective:   Physical Exam Filed Vitals:   12/21/12 1133  BP: 110/60  Pulse: 76  Temp: 99.5 F (37.5 C)  Height: 5\' 1"  (1.549 m)  Weight: 168 lb 6.4 oz (76.386 kg)   Alert and oriented. Skin warm and dry. Oral mucosa is moist.   . Sclera anicteric, conjunctivae is pink. Thyroid not enlarged. No cervical lymphadenopathy. Lungs clear. Heart regular rate and rhythm.  Abdomen is soft. Bowel sounds are positive. No hepatomegaly. No abdominal masses felt. No tenderness.  No edema to lower extremities.      Assessment:   Nausea possible  PUD. Constipation probably narcotic plus her other medication induced.    Plan:    Protonix 40mg  BID x 30 days then once a day. Samples of LInzess daily.  OV in 1 month

## 2012-12-27 ENCOUNTER — Encounter (INDEPENDENT_AMBULATORY_CARE_PROVIDER_SITE_OTHER): Payer: Self-pay

## 2013-01-18 ENCOUNTER — Ambulatory Visit (INDEPENDENT_AMBULATORY_CARE_PROVIDER_SITE_OTHER): Payer: Medicaid Other | Admitting: Internal Medicine

## 2013-01-24 ENCOUNTER — Ambulatory Visit (INDEPENDENT_AMBULATORY_CARE_PROVIDER_SITE_OTHER): Payer: Medicaid Other | Admitting: Internal Medicine

## 2013-03-10 IMAGING — CT CT ABD-PELV W/ CM
2 of 4 series · 17 of 46 positions shown, 19 images · IV contrast (Omnipaque 300)
Comparison: 10/11/2010

CLINICAL DATA: Nausea vomiting.  Shortness of breath.

CT ABDOMEN AND PELVIS WITH CONTRAST
TECHNIQUE: Multidetector CT imaging of the abdomen and pelvis was
performed following the standard protocol during bolus
administration of intravenous contrast.
Contrast: 100mL OMNIPAQUE IOHEXOL 300 MG/ML IJ SOLN

[Series 2: abd_pel_with 5.0 b40f · axial · 0.71mm/px · z∈[-354,+0]mm · 14 of 79 slices shown, 16 images]
[im 4/79  soft-tissue]
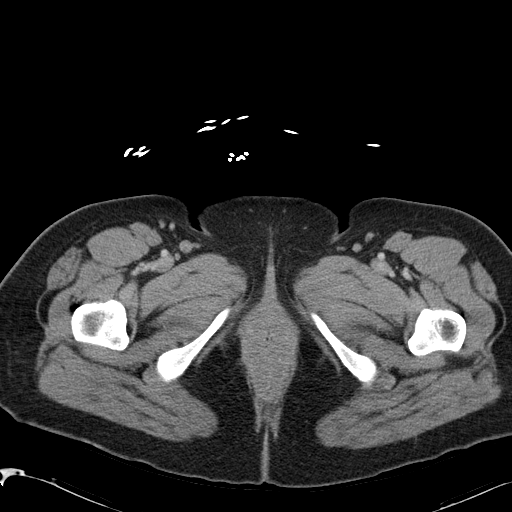
[im 4/79  bone]
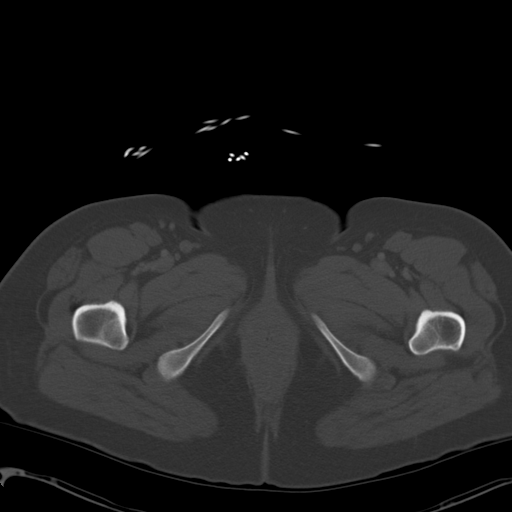
[im 12/79  soft-tissue]
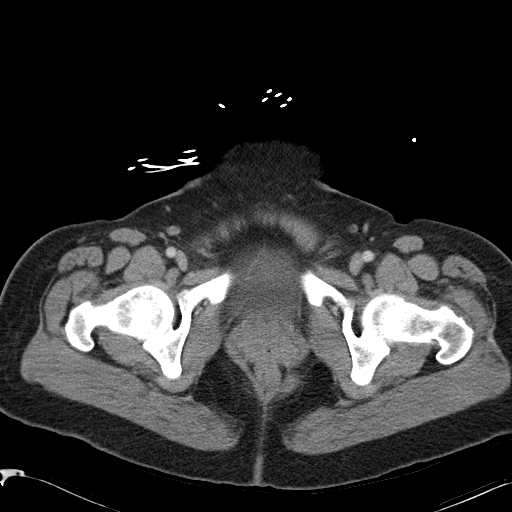
[im 16/79  soft-tissue]
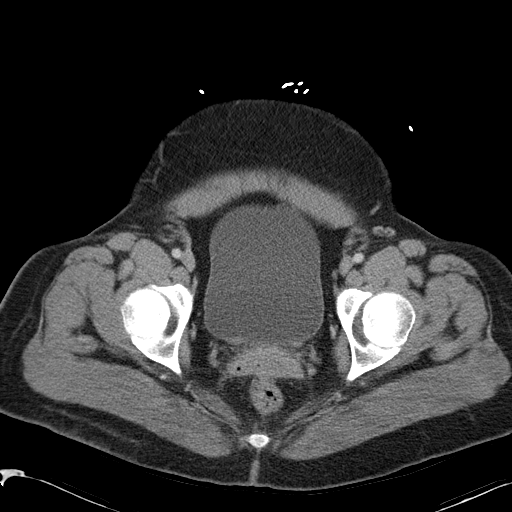
[im 20/79  soft-tissue]
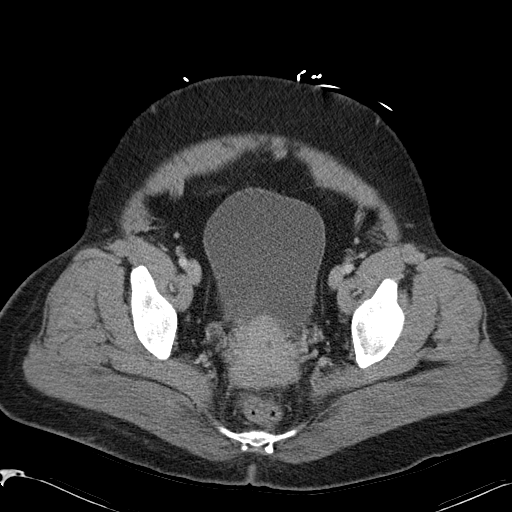
[im 28/79  soft-tissue]
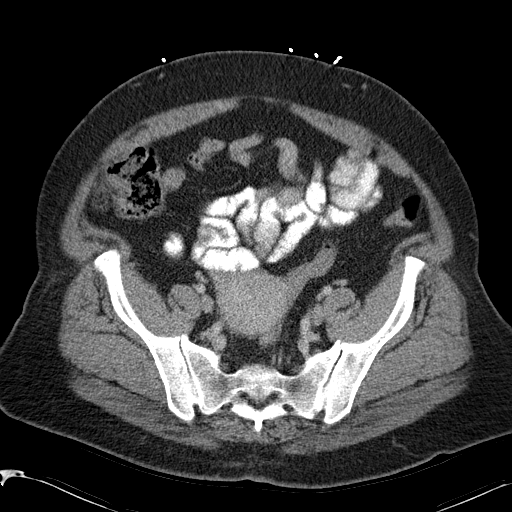
[im 32/79  soft-tissue]
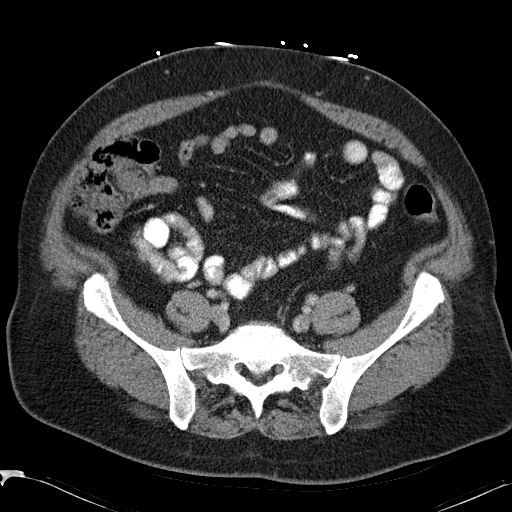
[im 36/79  soft-tissue]
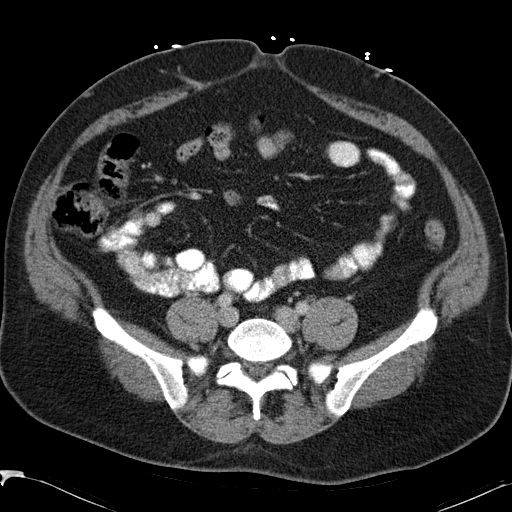
[im 43/79  soft-tissue]
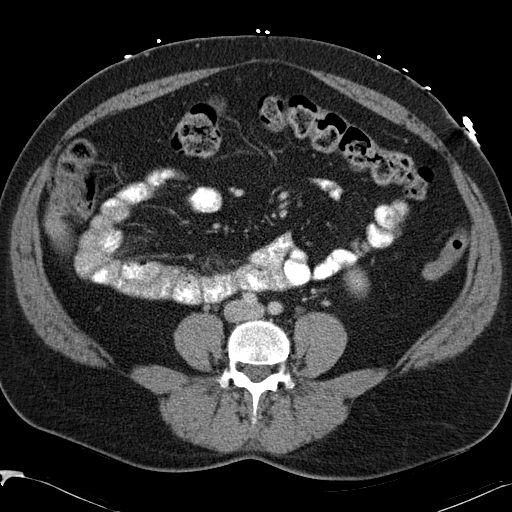
[im 47/79  soft-tissue]
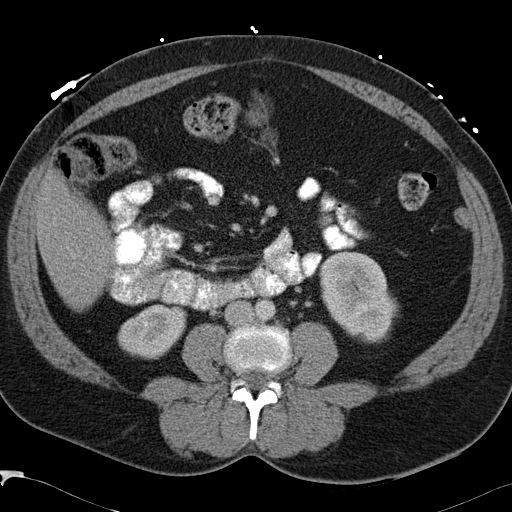
[im 47/79  bone]
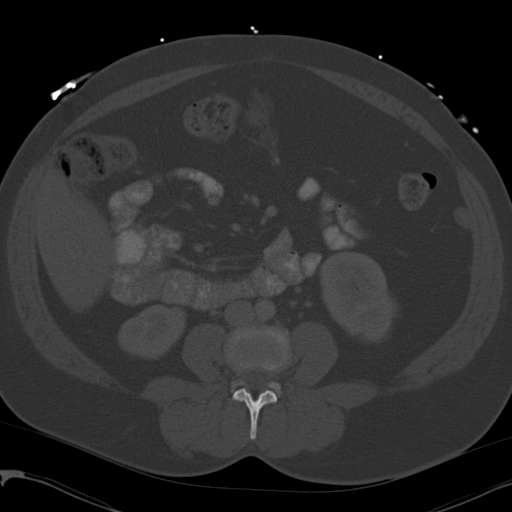
[im 51/79  soft-tissue]
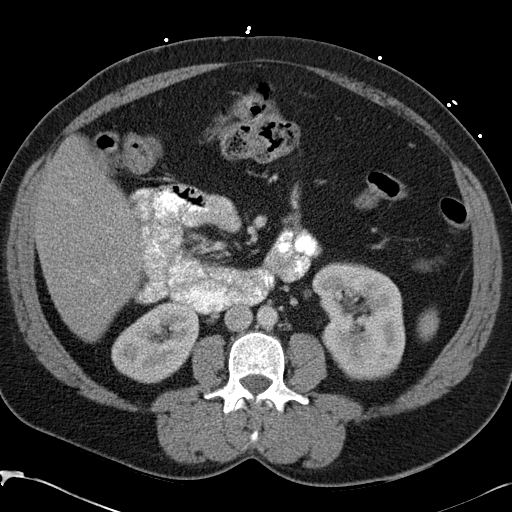
[im 59/79  soft-tissue]
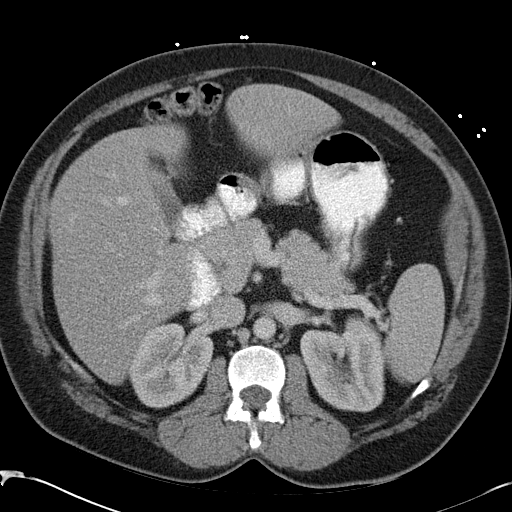
[im 63/79  soft-tissue]
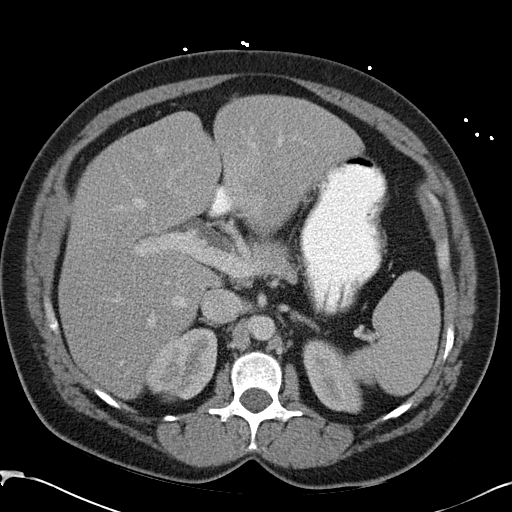
[im 67/79  soft-tissue]
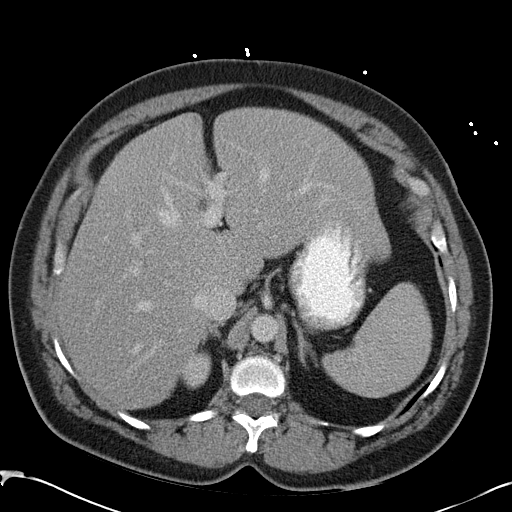
[im 75/79  soft-tissue]
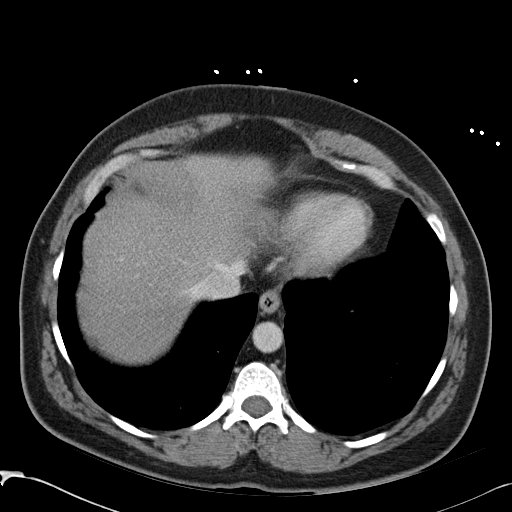

[Series 4: abd_pel_with 3.0 spo cor · coronal · 0.72mm/px · 3 of 90 slices shown]
[im 30/90  soft-tissue]
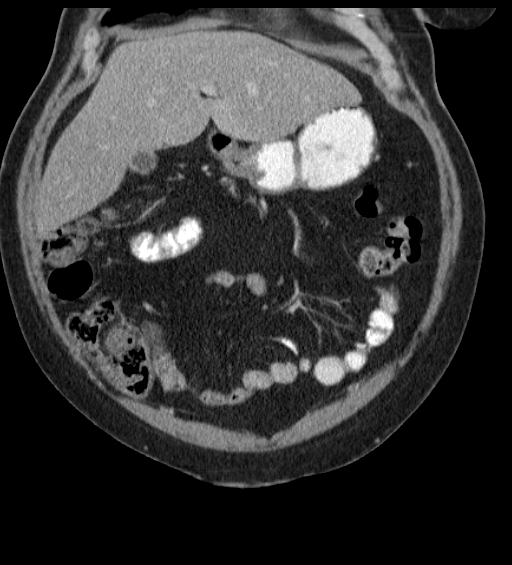
[im 40/90  soft-tissue]
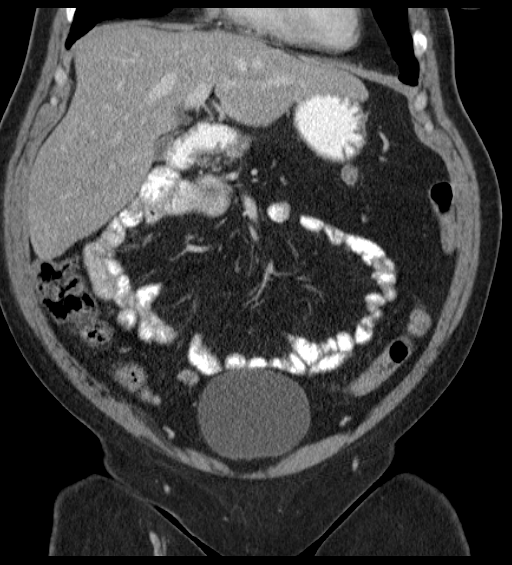
[im 50/90  soft-tissue]
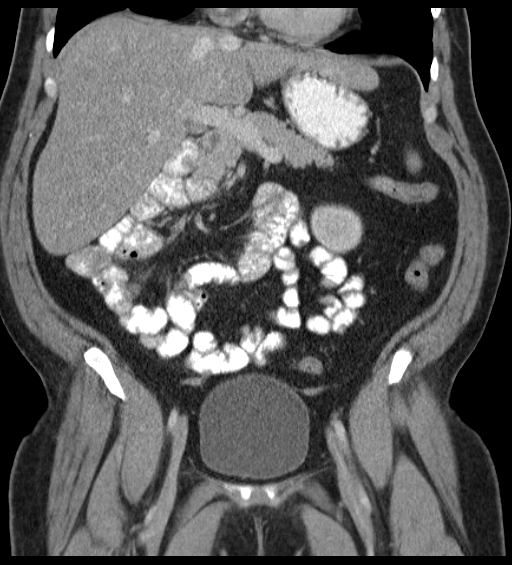

[17 of 46 positions shown; findings below may reference images not displayed]

FINDINGS: Subtle tree in bud opacity is seen in the right lower
lobe.

No focal abnormalities seen in the liver or spleen.  The stomach,
duodenum, pancreas, gallbladder, and adrenal glands are
unremarkable.  Kidneys are unremarkable.

No abdominal aortic aneurysm.  There is no free fluid or
lymphadenopathy in the abdomen.  Abdominal bowel loops are
unremarkable.

Imaging through the pelvis shows no free intraperitoneal fluid.  No
pelvic sidewall lymphadenopathy.  Bladder and uterus are
unremarkable.  There is no adnexal mass.  No colonic
diverticulitis.  Terminal ileum is normal.  The appendix is normal.

No acute bony abnormality.
IMPRESSION: No acute findings in the abdomen or pelvis.

Tree in bud opacity identified in the visualized portion of the
right lower lobe.  This may be related to bronchopneumonia or
atypical infection.

## 2013-03-10 IMAGING — CR DG CHEST 2V
2 series · 2 of 2 positions shown · non-contrast
Comparison: Chest x-ray 11/20/2011.

CLINICAL DATA: Shortness of breath, emesis, constipation.

CHEST - 2 VIEW

[view not recorded (1 of 2)]
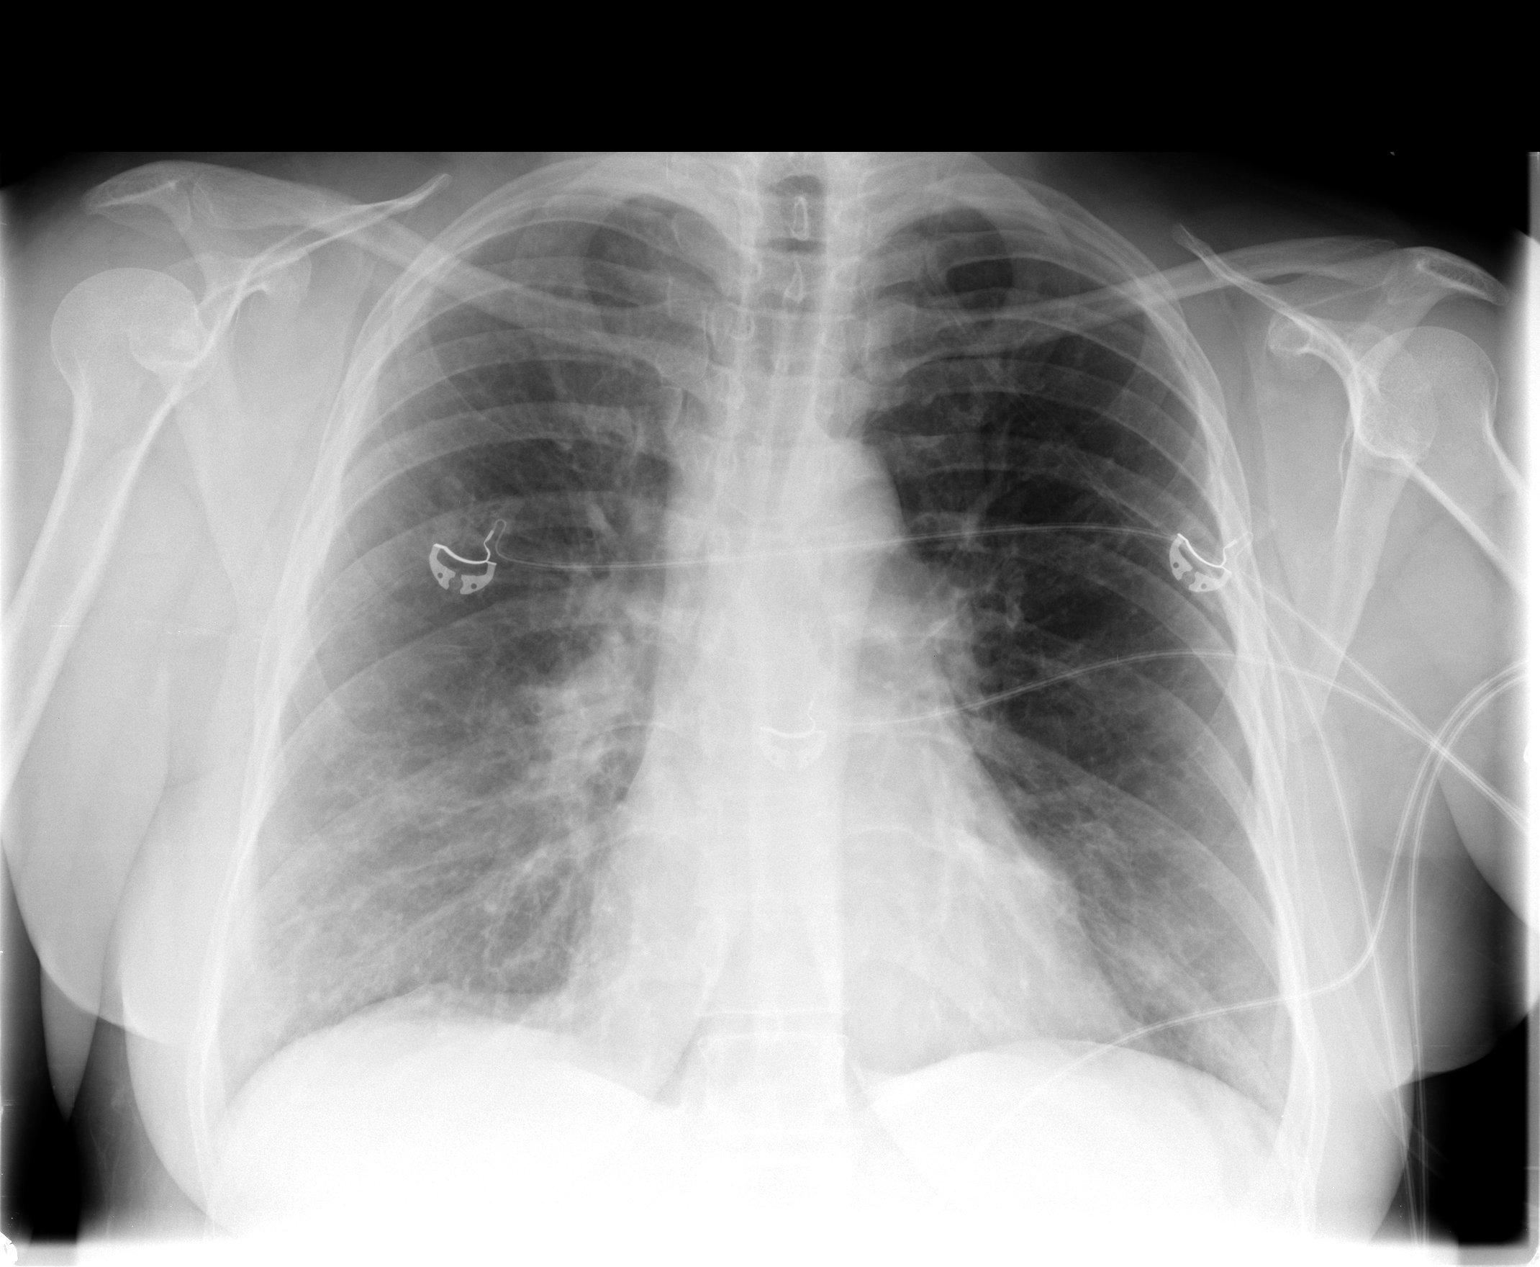

[view not recorded (2 of 2)]
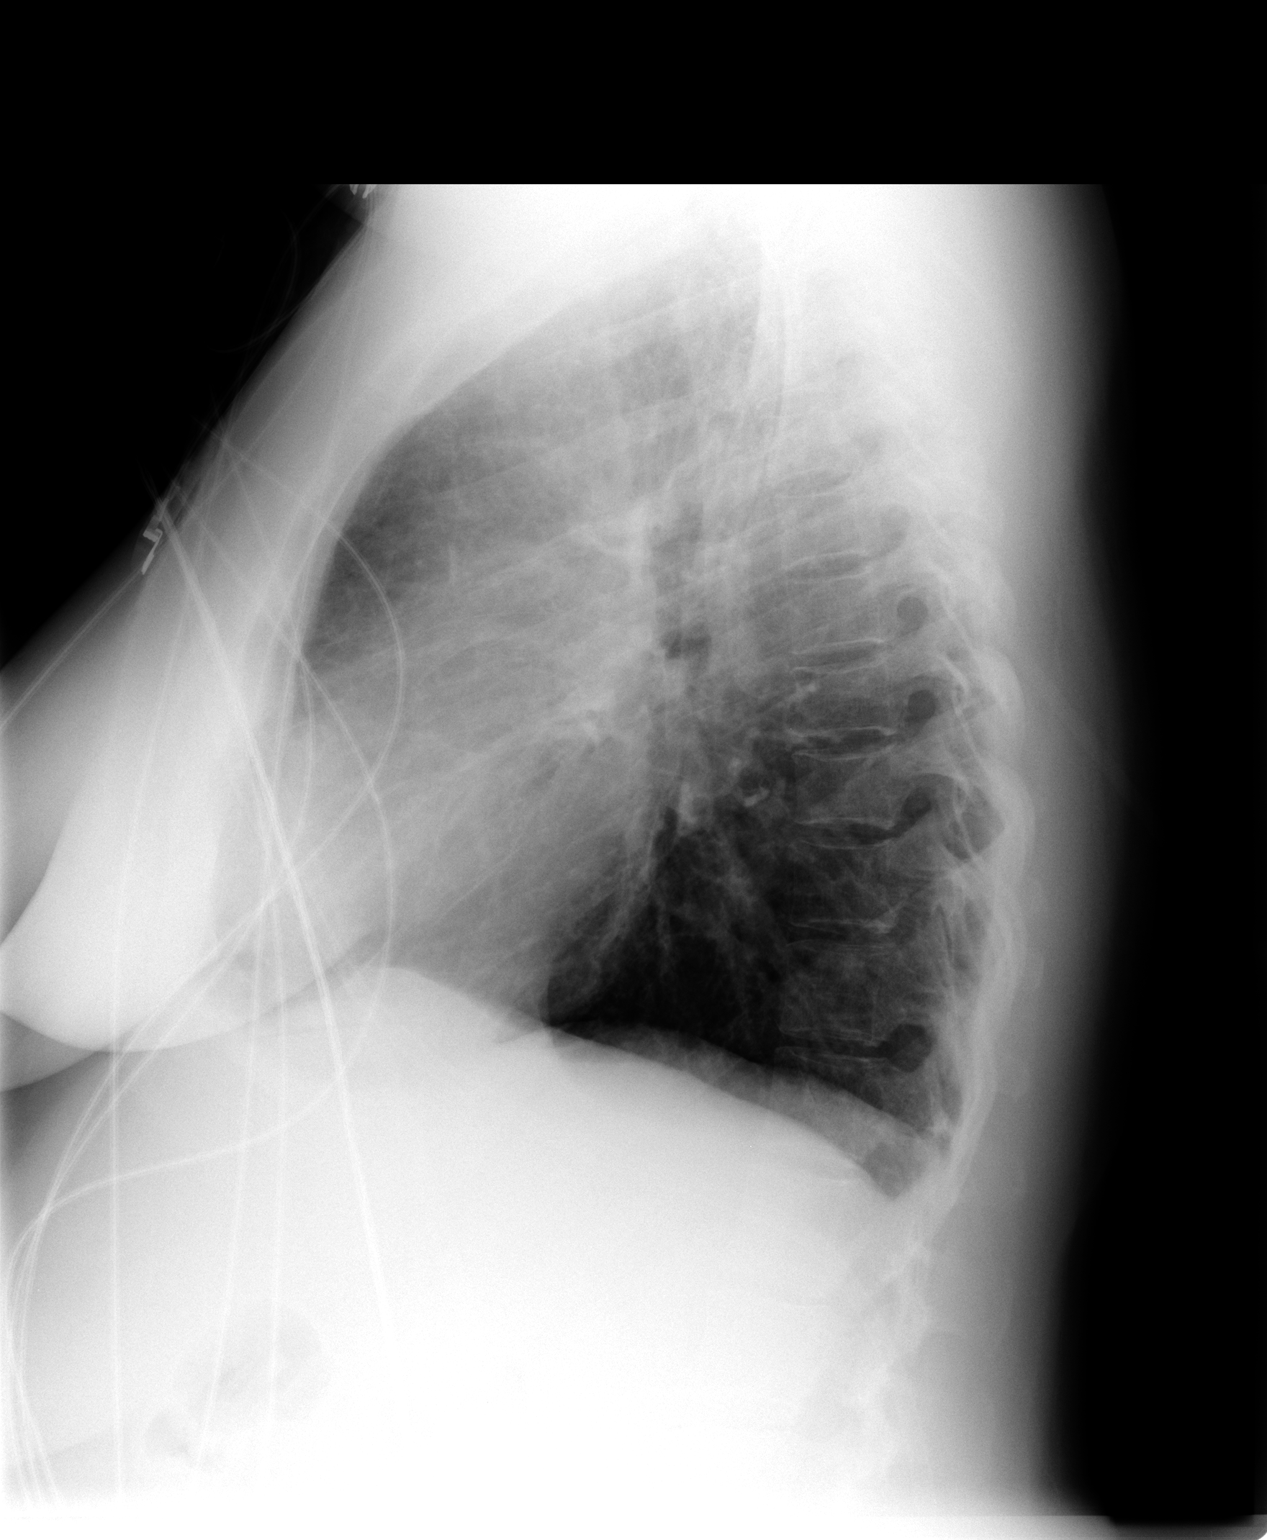

[2 of 2 positions shown; findings below may reference images not displayed]

FINDINGS: Lung volumes are normal.  Mild diffuse interstitial
prominence is noted, decreased compared to the prior study
11/20/2011.  No consolidative airspace disease.  No pleural
effusions.  Heart size is normal.  Mediastinal contours are
unremarkable.
IMPRESSION: 1.  Although there is mild diffuse interstitial prominence, this
has significantly improved compared to the prior examination, and
what is seen on today's studies favored to be a chronic finding.
No definite radiographic evidence of acute cardiopulmonary disease
otherwise noted.

## 2013-03-12 IMAGING — CR DG CHEST 1V PORT
1 series · 1 of 1 positions shown · non-contrast
Comparison: 02/03/2012.

CLINICAL DATA: PICC line placement.

PORTABLE CHEST - 1 VIEW

[view not recorded]
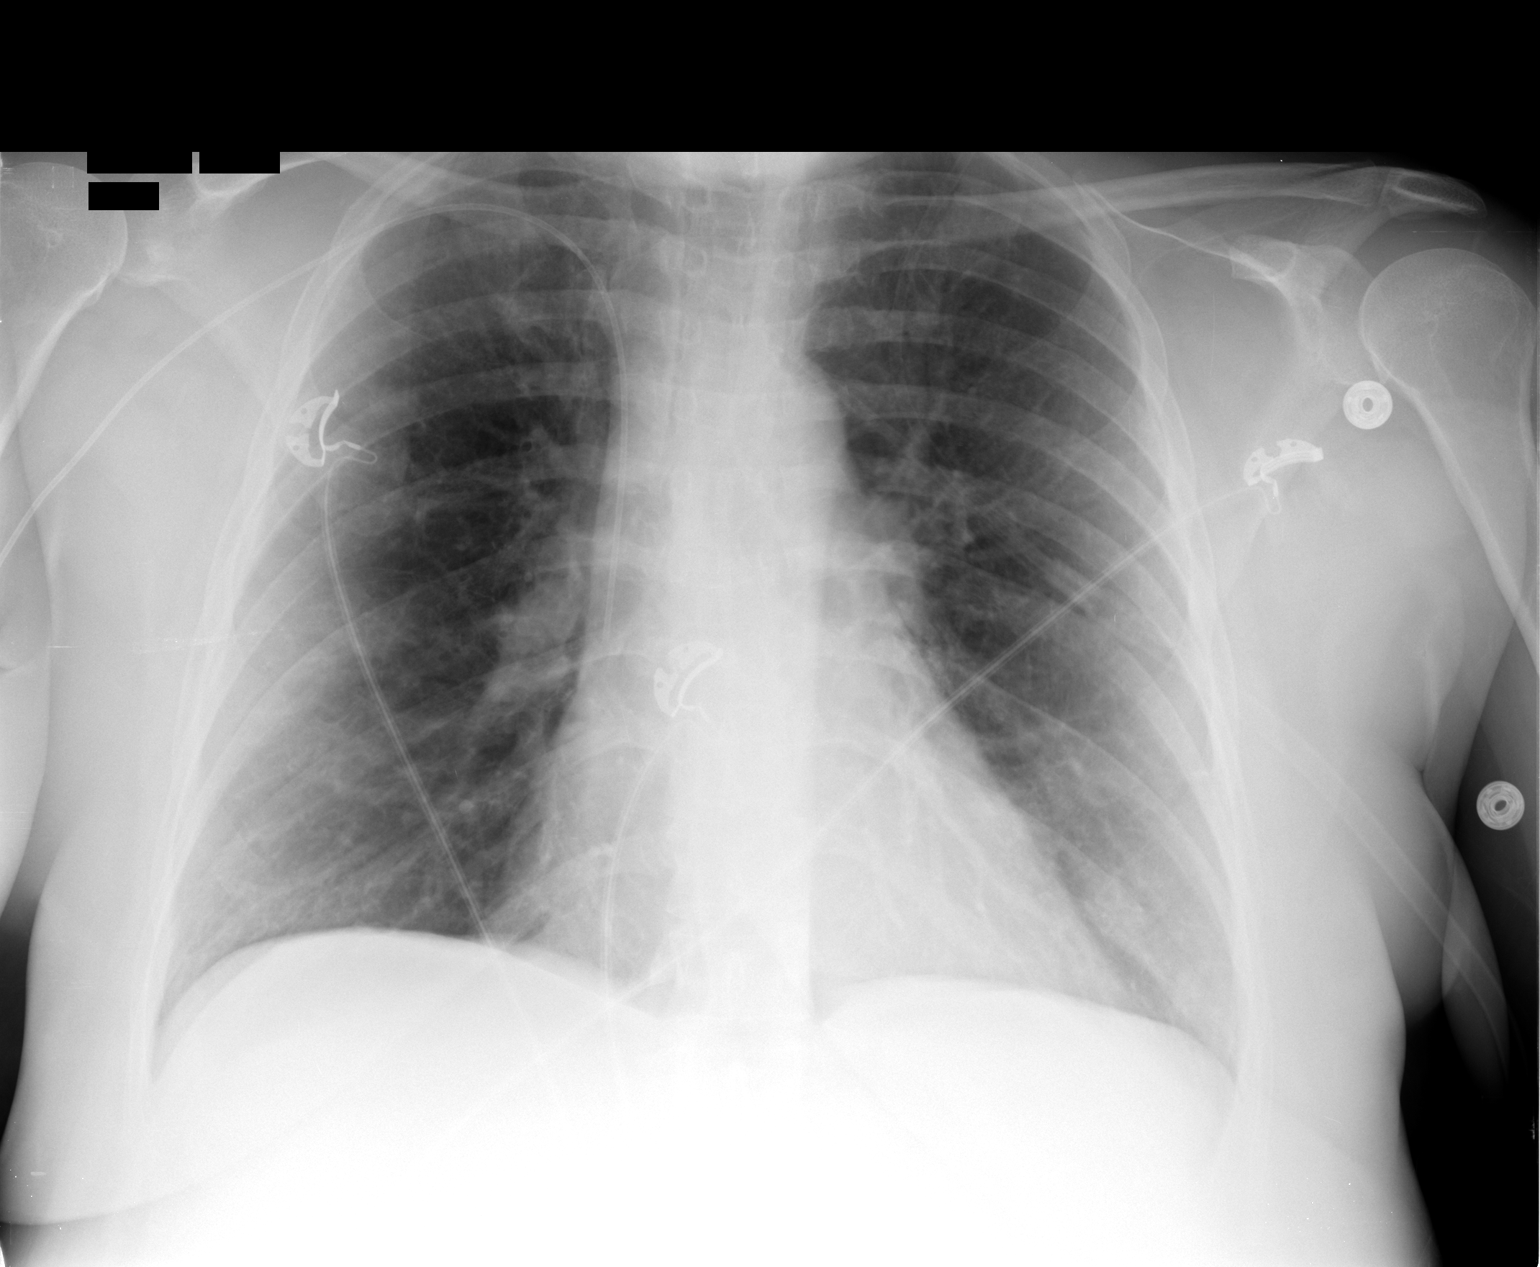

[1 of 1 positions shown; findings below may reference images not displayed]

FINDINGS: The cardiac silhouette, mediastinal and hilar contours
are within normal limits and stable.  The lungs show improved
aeration.  The right PICC line tip it is in the distal SVC near the
cavoatrial junction.  No complicating features.
IMPRESSION: Right sided PICC line in good position with the tip in the distal
SVC.

## 2013-03-13 IMAGING — CR DG KNEE COMPLETE 4+V*L*
4 series · 4 of 4 positions shown · non-contrast
Comparison: None.

CLINICAL DATA: Left knee pain.

LEFT KNEE - COMPLETE 4+ VIEW

[view not recorded (1 of 4)]
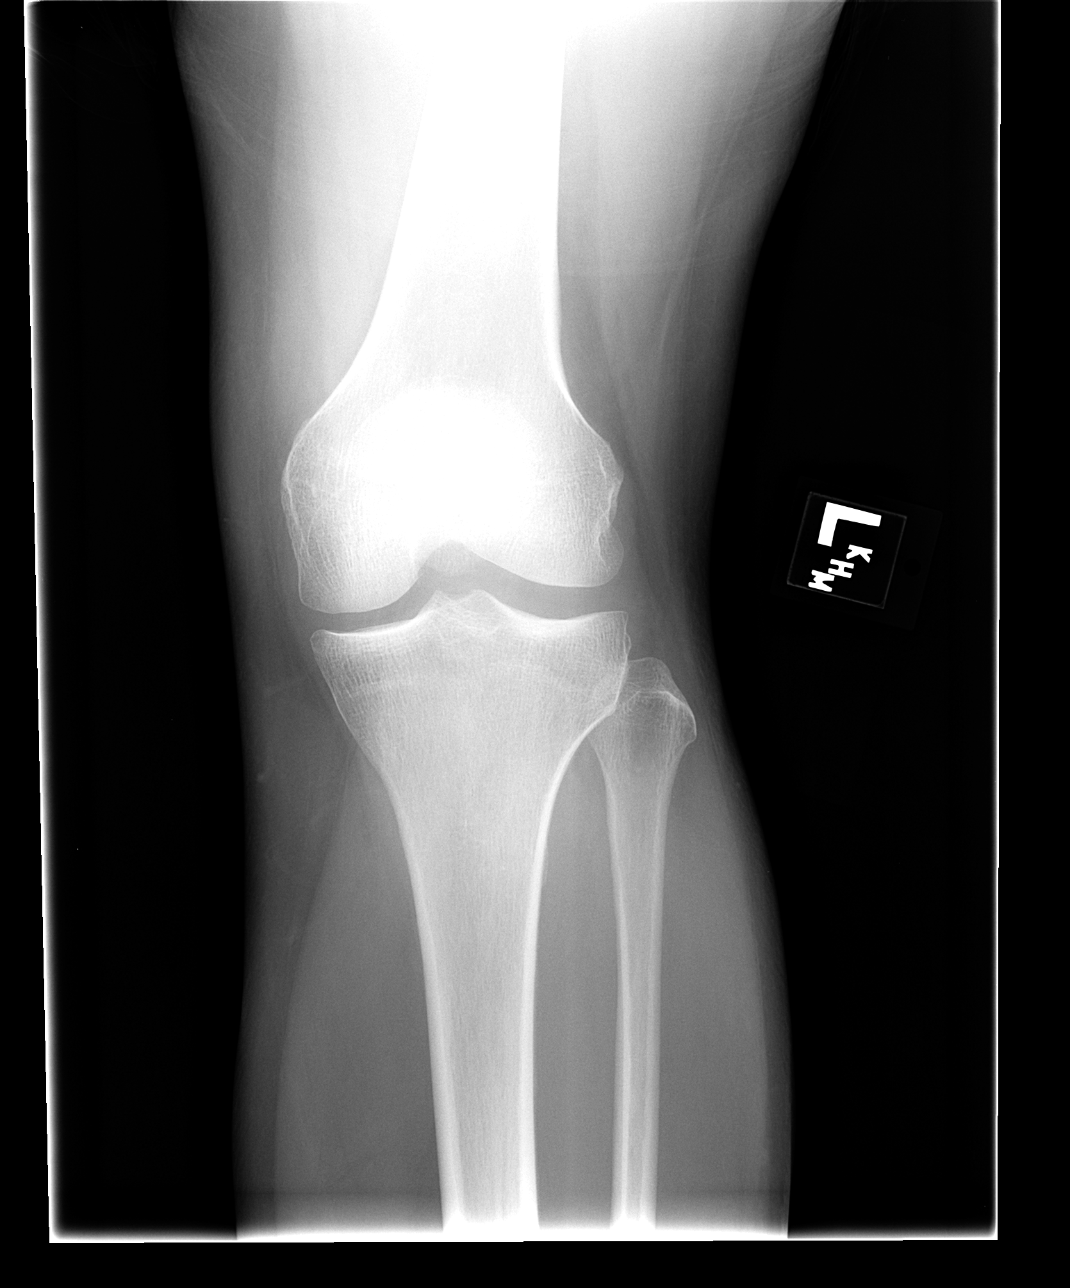

[view not recorded (2 of 4)]
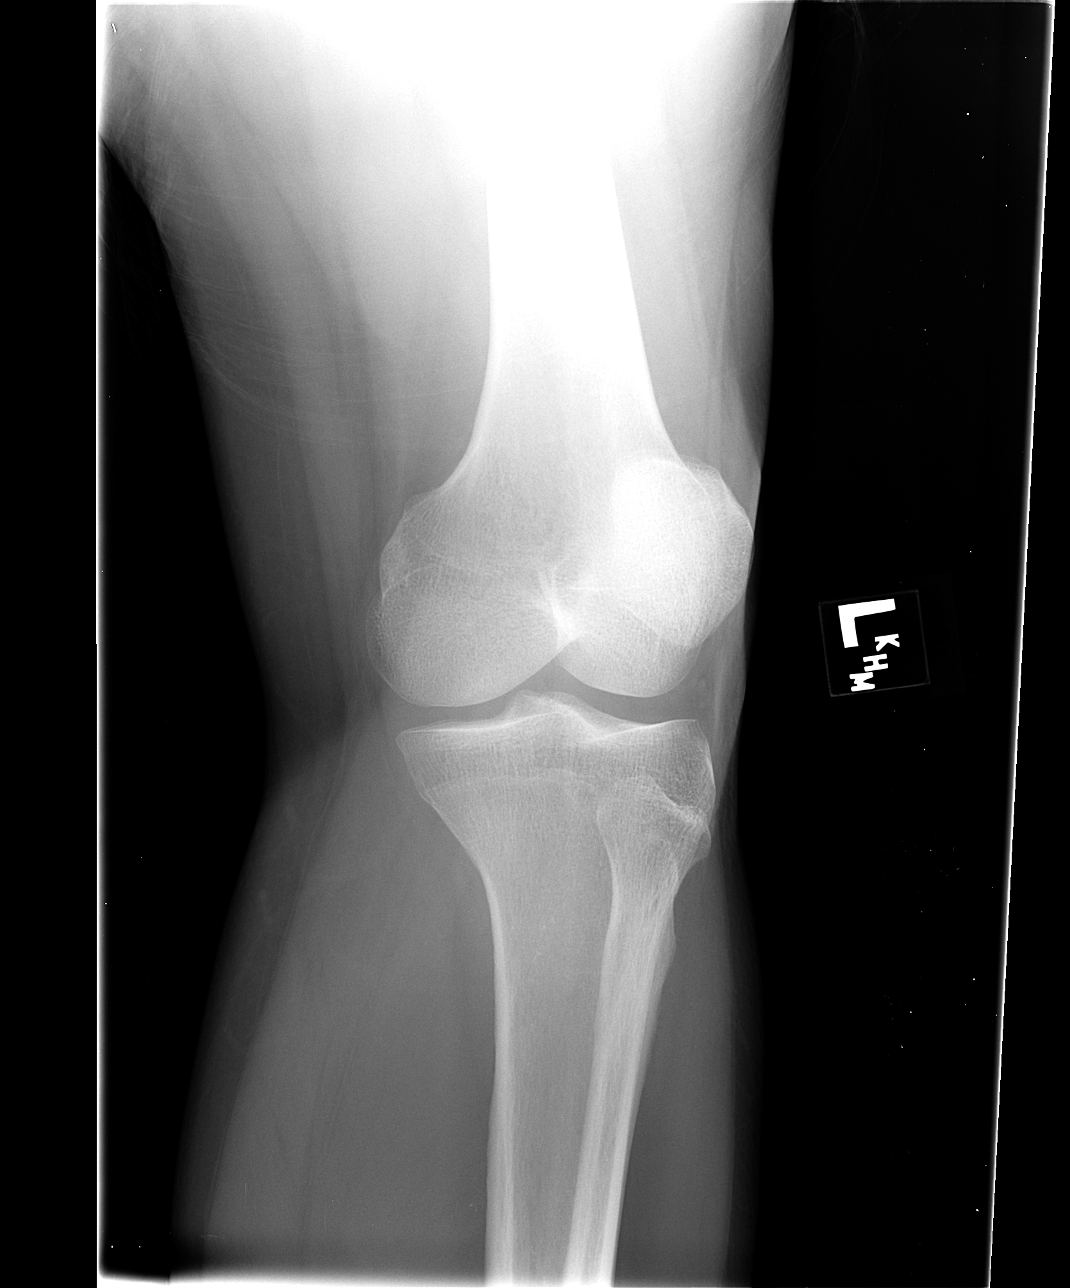

[view not recorded (3 of 4)]
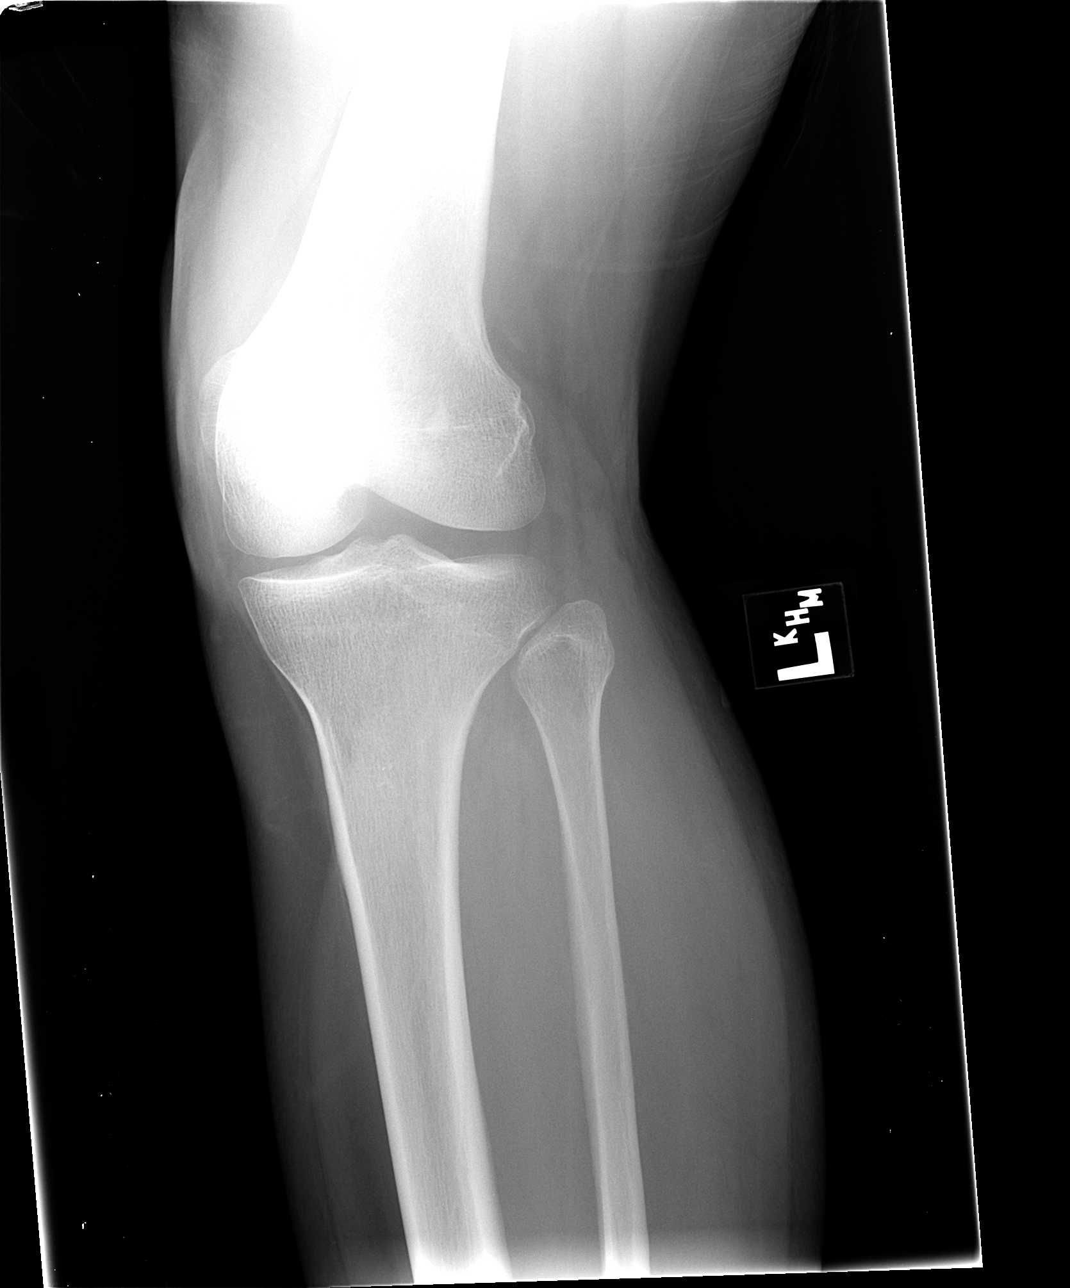

[view not recorded (4 of 4)]
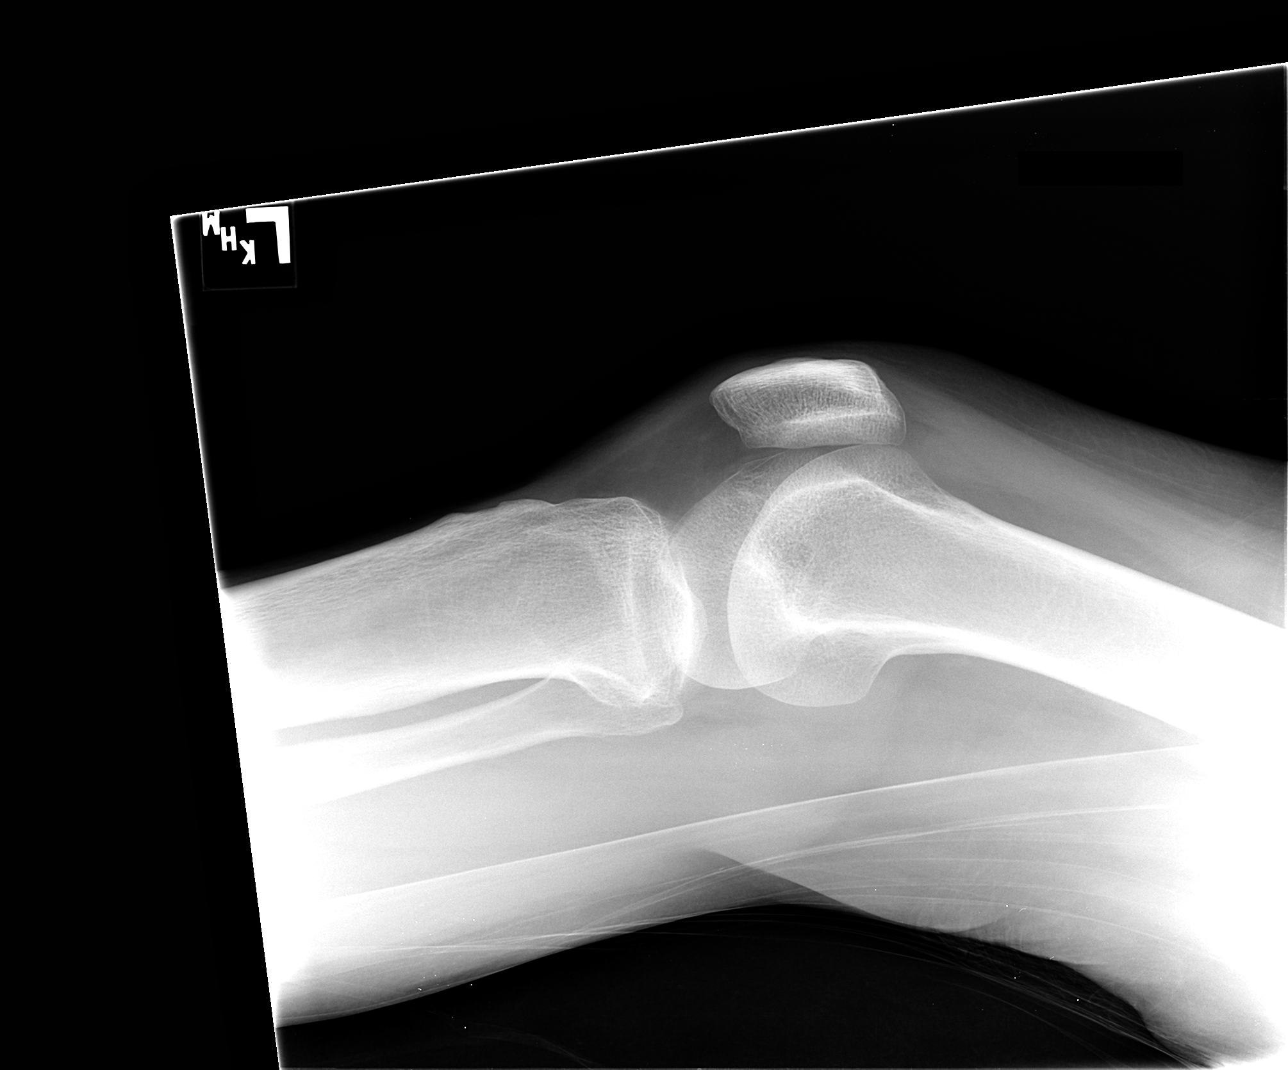

[4 of 4 positions shown; findings below may reference images not displayed]

FINDINGS: Osseous structures are normal.  Suggestion of a tiny
joint effusion.
IMPRESSION: Tiny joint effusion.

## 2013-03-31 ENCOUNTER — Ambulatory Visit (HOSPITAL_COMMUNITY)
Admission: RE | Admit: 2013-03-31 | Discharge: 2013-03-31 | Disposition: A | Discharge status: Home / Self Care | Payer: Medicaid Other | Source: Ambulatory Visit | Attending: Pulmonary Disease | Admitting: Pulmonary Disease

## 2013-03-31 ENCOUNTER — Other Ambulatory Visit (HOSPITAL_COMMUNITY): Payer: Self-pay | Admitting: Pulmonary Disease

## 2013-03-31 DIAGNOSIS — F17200 Nicotine dependence, unspecified, uncomplicated: Secondary | ICD-10-CM | POA: Insufficient documentation

## 2013-03-31 DIAGNOSIS — R05 Cough: Secondary | ICD-10-CM | POA: Insufficient documentation

## 2013-03-31 DIAGNOSIS — J069 Acute upper respiratory infection, unspecified: Secondary | ICD-10-CM

## 2013-03-31 DIAGNOSIS — R059 Cough, unspecified: Secondary | ICD-10-CM | POA: Insufficient documentation

## 2013-04-21 ENCOUNTER — Inpatient Hospital Stay (HOSPITAL_COMMUNITY)
Admission: AD | Admit: 2013-04-21 | Discharge: 2013-04-24 | DRG: 191 | Disposition: A | Discharge status: Home Health | Payer: Medicaid Other | Source: Ambulatory Visit | Attending: Pulmonary Disease | Admitting: Pulmonary Disease

## 2013-04-21 ENCOUNTER — Inpatient Hospital Stay (HOSPITAL_COMMUNITY): Payer: Medicaid Other

## 2013-04-21 ENCOUNTER — Encounter (HOSPITAL_COMMUNITY): Payer: Self-pay

## 2013-04-21 DIAGNOSIS — E119 Type 2 diabetes mellitus without complications: Secondary | ICD-10-CM | POA: Diagnosis present

## 2013-04-21 DIAGNOSIS — K5900 Constipation, unspecified: Secondary | ICD-10-CM | POA: Diagnosis present

## 2013-04-21 DIAGNOSIS — F17200 Nicotine dependence, unspecified, uncomplicated: Secondary | ICD-10-CM | POA: Diagnosis present

## 2013-04-21 DIAGNOSIS — M545 Low back pain, unspecified: Secondary | ICD-10-CM | POA: Diagnosis present

## 2013-04-21 DIAGNOSIS — Z8249 Family history of ischemic heart disease and other diseases of the circulatory system: Secondary | ICD-10-CM

## 2013-04-21 DIAGNOSIS — Z79899 Other long term (current) drug therapy: Secondary | ICD-10-CM

## 2013-04-21 DIAGNOSIS — Z881 Allergy status to other antibiotic agents status: Secondary | ICD-10-CM

## 2013-04-21 DIAGNOSIS — K219 Gastro-esophageal reflux disease without esophagitis: Secondary | ICD-10-CM | POA: Diagnosis present

## 2013-04-21 DIAGNOSIS — M549 Dorsalgia, unspecified: Secondary | ICD-10-CM | POA: Diagnosis present

## 2013-04-21 DIAGNOSIS — G43909 Migraine, unspecified, not intractable, without status migrainosus: Secondary | ICD-10-CM | POA: Diagnosis present

## 2013-04-21 DIAGNOSIS — J45901 Unspecified asthma with (acute) exacerbation: Principal | ICD-10-CM | POA: Diagnosis present

## 2013-04-21 DIAGNOSIS — G8929 Other chronic pain: Secondary | ICD-10-CM | POA: Diagnosis present

## 2013-04-21 DIAGNOSIS — Z9981 Dependence on supplemental oxygen: Secondary | ICD-10-CM

## 2013-04-21 DIAGNOSIS — B370 Candidal stomatitis: Secondary | ICD-10-CM | POA: Diagnosis present

## 2013-04-21 DIAGNOSIS — J441 Chronic obstructive pulmonary disease with (acute) exacerbation: Principal | ICD-10-CM | POA: Diagnosis present

## 2013-04-21 DIAGNOSIS — Z880 Allergy status to penicillin: Secondary | ICD-10-CM

## 2013-04-21 DIAGNOSIS — Z885 Allergy status to narcotic agent status: Secondary | ICD-10-CM

## 2013-04-21 LAB — CBC WITH DIFFERENTIAL/PLATELET
Basophils Relative: 0 % (ref 0–1)
Eosinophils Absolute: 0.1 10*3/uL (ref 0.0–0.7)
Hemoglobin: 12.8 g/dL (ref 12.0–15.0)
MCH: 28.2 pg (ref 26.0–34.0)
MCHC: 33.1 g/dL (ref 30.0–36.0)
Monocytes Absolute: 0.8 10*3/uL (ref 0.1–1.0)
Monocytes Relative: 5 % (ref 3–12)
Neutrophils Relative %: 88 % — ABNORMAL HIGH (ref 43–77)

## 2013-04-21 LAB — COMPREHENSIVE METABOLIC PANEL
ALT: 10 U/L (ref 0–35)
AST: 13 U/L (ref 0–37)
CO2: 33 mEq/L — ABNORMAL HIGH (ref 19–32)
Calcium: 9.4 mg/dL (ref 8.4–10.5)
Chloride: 94 mEq/L — ABNORMAL LOW (ref 96–112)
GFR calc non Af Amer: >90 mL/min (ref >90)
Potassium: 3.7 mEq/L (ref 3.5–5.1)
Sodium: 135 mEq/L (ref 135–145)

## 2013-04-21 LAB — MRSA PCR SCREENING: MRSA by PCR: NEGATIVE

## 2013-04-21 MED ORDER — METHYLPREDNISOLONE SODIUM SUCC 125 MG IJ SOLR
125.0000 mg | Freq: Four times a day (QID) | INTRAMUSCULAR | Status: DC
  Administered 2013-04-21 – 2013-04-24 (×12): 125 mg via INTRAVENOUS
  Filled 2013-04-21 (×12): qty 2

## 2013-04-21 MED ORDER — PROMETHAZINE HCL 12.5 MG PO TABS: 25.0000 mg | ORAL_TABLET | Freq: Four times a day (QID) | ORAL | Status: DC | PRN

## 2013-04-21 MED ORDER — FENOFIBRATE 160 MG PO TABS
160.0000 mg | ORAL_TABLET | Freq: Every day | ORAL | Status: DC
  Administered 2013-04-21 – 2013-04-23 (×3): 160 mg via ORAL
  Filled 2013-04-21 (×4): qty 1

## 2013-04-21 MED ORDER — DEXTROSE 5 % IV SOLN
1.0000 g | INTRAVENOUS | Status: DC
  Administered 2013-04-21 – 2013-04-23 (×3): 1 g via INTRAVENOUS
  Filled 2013-04-21 (×3): qty 10

## 2013-04-21 MED ORDER — SIMETHICONE 80 MG PO CHEW
240.0000 mg | CHEWABLE_TABLET | Freq: Every day | ORAL | Status: DC
  Administered 2013-04-21 – 2013-04-24 (×4): 240 mg via ORAL
  Filled 2013-04-21: qty 3
  Filled 2013-04-21: qty 1
  Filled 2013-04-21: qty 3
  Filled 2013-04-21: qty 2
  Filled 2013-04-21: qty 3

## 2013-04-21 MED ORDER — SODIUM CHLORIDE 0.9 % IV SOLN
INTRAVENOUS | Status: DC
  Administered 2013-04-21 – 2013-04-23 (×5): via INTRAVENOUS

## 2013-04-21 MED ORDER — OXYCODONE-ACETAMINOPHEN 10-325 MG PO TABS: 1.0000 | ORAL_TABLET | ORAL | Status: DC | PRN

## 2013-04-21 MED ORDER — LEVOFLOXACIN IN D5W 500 MG/100ML IV SOLN
500.0000 mg | INTRAVENOUS | Status: DC
  Administered 2013-04-21: 500 mg via INTRAVENOUS
  Filled 2013-04-21 (×4): qty 100

## 2013-04-21 MED ORDER — IPRATROPIUM BROMIDE 0.02 % IN SOLN
0.5000 mg | RESPIRATORY_TRACT | Status: DC
  Administered 2013-04-21 – 2013-04-24 (×14): 0.5 mg via RESPIRATORY_TRACT
  Filled 2013-04-21 (×14): qty 2.5

## 2013-04-21 MED ORDER — PREGABALIN 50 MG PO CAPS
50.0000 mg | ORAL_CAPSULE | Freq: Every day | ORAL | Status: DC
  Administered 2013-04-21 – 2013-04-23 (×3): 50 mg via ORAL
  Filled 2013-04-21 (×3): qty 1

## 2013-04-21 MED ORDER — ONDANSETRON HCL 4 MG/2ML IJ SOLN
4.0000 mg | Freq: Four times a day (QID) | INTRAMUSCULAR | Status: DC | PRN
  Administered 2013-04-22 – 2013-04-24 (×4): 4 mg via INTRAVENOUS
  Filled 2013-04-21 (×4): qty 2

## 2013-04-21 MED ORDER — BUDESONIDE-FORMOTEROL FUMARATE 160-4.5 MCG/ACT IN AERO
2.0000 | INHALATION_SPRAY | Freq: Two times a day (BID) | RESPIRATORY_TRACT | Status: DC
  Administered 2013-04-21 – 2013-04-24 (×6): 2 via RESPIRATORY_TRACT
  Filled 2013-04-21: qty 6

## 2013-04-21 MED ORDER — OXYCODONE-ACETAMINOPHEN 5-325 MG PO TABS
1.0000 | ORAL_TABLET | ORAL | Status: DC | PRN
  Administered 2013-04-21 – 2013-04-24 (×15): 1 via ORAL
  Filled 2013-04-21 (×15): qty 1

## 2013-04-21 MED ORDER — OXYCODONE HCL ER 10 MG PO T12A
10.0000 mg | EXTENDED_RELEASE_TABLET | Freq: Four times a day (QID) | ORAL | Status: DC
  Administered 2013-04-21 – 2013-04-24 (×11): 10 mg via ORAL
  Filled 2013-04-21 (×11): qty 1

## 2013-04-21 MED ORDER — SODIUM CHLORIDE 0.9 % IJ SOLN: 3.0000 mL | Freq: Two times a day (BID) | INTRAMUSCULAR | Status: DC

## 2013-04-21 MED ORDER — GUAIFENESIN ER 600 MG PO TB12
1200.0000 mg | ORAL_TABLET | Freq: Two times a day (BID) | ORAL | Status: DC
  Administered 2013-04-21 – 2013-04-24 (×7): 1200 mg via ORAL
  Filled 2013-04-21 (×7): qty 2

## 2013-04-21 MED ORDER — FLUTICASONE PROPIONATE 50 MCG/ACT NA SUSP
2.0000 | Freq: Every day | NASAL | Status: DC
  Administered 2013-04-21 – 2013-04-23 (×3): 2 via NASAL
  Filled 2013-04-21: qty 16

## 2013-04-21 MED ORDER — ALBUTEROL SULFATE (5 MG/ML) 0.5% IN NEBU
2.5000 mg | INHALATION_SOLUTION | RESPIRATORY_TRACT | Status: DC
  Administered 2013-04-21 – 2013-04-24 (×14): 2.5 mg via RESPIRATORY_TRACT
  Filled 2013-04-21 (×14): qty 0.5

## 2013-04-21 MED ORDER — INSULIN ASPART 100 UNIT/ML ~~LOC~~ SOLN
0.0000 [IU] | Freq: Three times a day (TID) | SUBCUTANEOUS | Status: DC
  Administered 2013-04-21: 11 [IU] via SUBCUTANEOUS
  Administered 2013-04-22: 15 [IU] via SUBCUTANEOUS
  Administered 2013-04-22: 11 [IU] via SUBCUTANEOUS
  Administered 2013-04-22: 15 [IU] via SUBCUTANEOUS
  Administered 2013-04-23: 11 [IU] via SUBCUTANEOUS
  Administered 2013-04-23: 7 [IU] via SUBCUTANEOUS
  Administered 2013-04-23 – 2013-04-24 (×2): 15 [IU] via SUBCUTANEOUS

## 2013-04-21 MED ORDER — PANTOPRAZOLE SODIUM 40 MG PO TBEC
40.0000 mg | DELAYED_RELEASE_TABLET | Freq: Two times a day (BID) | ORAL | Status: DC
  Administered 2013-04-21 – 2013-04-24 (×7): 40 mg via ORAL
  Filled 2013-04-21 (×7): qty 1

## 2013-04-21 MED ORDER — LISINOPRIL 10 MG PO TABS
20.0000 mg | ORAL_TABLET | Freq: Every day | ORAL | Status: DC
Start: 2013-04-21 — End: 2013-04-24
  Administered 2013-04-21 – 2013-04-24 (×4): 20 mg via ORAL
  Filled 2013-04-21 (×4): qty 2

## 2013-04-21 MED ORDER — SIMETHICONE 125 MG PO CAPS: 2.0000 | ORAL_CAPSULE | Freq: Every morning | ORAL | Status: DC

## 2013-04-21 MED ORDER — ACETAMINOPHEN 650 MG RE SUPP: 650.0000 mg | Freq: Four times a day (QID) | RECTAL | Status: DC | PRN

## 2013-04-21 MED ORDER — DOCUSATE SODIUM 100 MG PO CAPS
200.0000 mg | ORAL_CAPSULE | Freq: Every morning | ORAL | Status: DC
  Administered 2013-04-21 – 2013-04-22 (×2): 200 mg via ORAL
  Filled 2013-04-21 (×2): qty 2

## 2013-04-21 MED ORDER — FLUCONAZOLE 100 MG PO TABS
100.0000 mg | ORAL_TABLET | Freq: Every day | ORAL | Status: DC
  Administered 2013-04-21 – 2013-04-24 (×4): 100 mg via ORAL
  Filled 2013-04-21 (×4): qty 1

## 2013-04-21 MED ORDER — OXYCODONE HCL 5 MG PO TABS
5.0000 mg | ORAL_TABLET | ORAL | Status: DC | PRN
  Administered 2013-04-21 – 2013-04-24 (×16): 5 mg via ORAL
  Filled 2013-04-21 (×16): qty 1

## 2013-04-21 MED ORDER — OXYCODONE HCL 10 MG PO TB12
10.0000 mg | ORAL_TABLET | Freq: Four times a day (QID) | ORAL | Status: DC
  Filled 2013-04-21 (×13): qty 1

## 2013-04-21 MED ORDER — ACETAMINOPHEN 325 MG PO TABS: 650.0000 mg | ORAL_TABLET | Freq: Four times a day (QID) | ORAL | Status: DC | PRN

## 2013-04-21 MED ORDER — ENOXAPARIN SODIUM 40 MG/0.4ML ~~LOC~~ SOLN
40.0000 mg | SUBCUTANEOUS | Status: DC
  Administered 2013-04-21 – 2013-04-23 (×3): 40 mg via SUBCUTANEOUS
  Filled 2013-04-21 (×3): qty 0.4

## 2013-04-21 MED ORDER — ALUM & MAG HYDROXIDE-SIMETH 200-200-20 MG/5ML PO SUSP: 30.0000 mL | Freq: Four times a day (QID) | ORAL | Status: DC | PRN

## 2013-04-21 MED ORDER — INSULIN ASPART 100 UNIT/ML ~~LOC~~ SOLN
0.0000 [IU] | Freq: Every day | SUBCUTANEOUS | Status: DC
  Administered 2013-04-21 – 2013-04-23 (×3): 3 [IU] via SUBCUTANEOUS

## 2013-04-21 MED ORDER — ONDANSETRON HCL 4 MG PO TABS: 4.0000 mg | ORAL_TABLET | Freq: Four times a day (QID) | ORAL | Status: DC | PRN

## 2013-04-21 NOTE — Plan of Care (Signed)
Problem: Phase II Progression Outcomes Goal: O2 sats > equal to 90% on RA or at baseline Outcome: Completed/Met Date Met:  04/21/13 Baseline O2 @3l  at home

## 2013-04-22 DIAGNOSIS — J441 Chronic obstructive pulmonary disease with (acute) exacerbation: Secondary | ICD-10-CM | POA: Diagnosis present

## 2013-04-22 LAB — EXPECTORATED SPUTUM ASSESSMENT W GRAM STAIN, RFLX TO RESP C: Special Requests: NORMAL

## 2013-04-22 LAB — GLUCOSE, CAPILLARY

## 2013-04-22 MED ORDER — HYDROCOD POLST-CHLORPHEN POLST 10-8 MG/5ML PO LQCR
5.0000 mL | Freq: Three times a day (TID) | ORAL | Status: DC | PRN
  Administered 2013-04-22 – 2013-04-23 (×5): 5 mL via ORAL
  Filled 2013-04-22 (×5): qty 5

## 2013-04-22 MED ORDER — SUMATRIPTAN SUCCINATE 50 MG PO TABS
100.0000 mg | ORAL_TABLET | ORAL | Status: DC | PRN
  Administered 2013-04-22 – 2013-04-24 (×4): 100 mg via ORAL
  Filled 2013-04-22 (×2): qty 2
  Filled 2013-04-22: qty 1

## 2013-04-22 MED ORDER — LORAZEPAM 0.5 MG PO TABS
0.5000 mg | ORAL_TABLET | Freq: Four times a day (QID) | ORAL | Status: DC | PRN
  Administered 2013-04-22 – 2013-04-24 (×7): 0.5 mg via ORAL
  Filled 2013-04-22 (×7): qty 1

## 2013-04-22 NOTE — H&P (Signed)
[name] MRN: [id number] DOB/AGE: 45-Mar-1967 45 y.o. Primary Care Physician:Yaretsi Humphres L, MD Admit date: 04/21/2013 Chief Complaint: Shortness of breath HPI: This is a 47 year old who came to my office on the day of admission with increasing problems with shortness of breath cough congestion and fever. She has been treated as an outpatient for COPD exacerbation but her symptoms have not improved and in fact has gotten worse. She also feels like she may have Candida infection in her esophagus because she's had that in the past and she has similar symptoms now. She has trouble with swallowing and it is painful. She has been bringing up yellowish sputum. She's had temperature as high as 101. She said trace hemoptysis. She has not had any chest pain other than as mentioned.  Past Medical History  Diagnosis Date  . Migraine headache   . On home O2   . COPD (chronic obstructive pulmonary disease)   . Chronic back pain   . Hyperglycemia, drug-induced     steroid induced hyperglycemia  . ARDS (adult respiratory distress syndrome)     Jan 2011  . Diabetes mellitus   . Pneumonia   . Angina   . Asthma   . Shortness of breath   . Recurrent upper respiratory infection (URI)   . GERD (gastroesophageal reflux disease) 12/21/2012   Past Surgical History  Procedure Laterality Date  . Tubal ligation    . Uterine ablasion    . C-section    . Tracheostomy      decannulated 12/2009        Family History  Problem Relation Age of Onset  . Coronary artery disease Brother   . Diabetes Other   . Cancer Other   . Hypertension Other     Social History:  reports that she has been smoking Cigarettes.  She has a 8.25 pack-year smoking history. She does not have any smokeless tobacco history on file. She reports that she does not drink alcohol or use illicit drugs.   Allergies:  Allergies  Allergen Reactions  . Penicillins Other (See Comments)    convulsions  . Levaquin (Levofloxacin In  D5w) Itching  . Morphine Nausea Only    Medications Prior to Admission  Medication Sig Dispense Refill  . albuterol (PROAIR HFA) 108 (90 BASE) MCG/ACT inhaler Inhale 2 puffs into the lungs every 6 (six) hours as needed. For shortness of breATH      . albuterol (PROVENTIL) (2.5 MG/3ML) 0.083% nebulizer solution Take 2.5 mg by nebulization every 4 (four) hours as needed. For shortness of breath       . budesonide-formoterol (SYMBICORT) 160-4.5 MCG/ACT inhaler Inhale 2 puffs into the lungs 2 (two) times daily.      . Choline Fenofibrate (TRILIPIX) 135 MG capsule Take 135 mg by mouth every morning.       . Diphenhyd-Hydrocort-Nystatin (FIRST-DUKES MOUTHWASH MT) Use as directed 5 mLs in the mouth or throat daily as needed. FOR THRUSH      . docusate sodium (COLACE) 100 MG capsule Take 200 mg by mouth every morning.      . fluticasone (FLONASE) 50 MCG/ACT nasal spray Place 2 sprays into the nose daily.      [name] lisinopril (PRINIVIL,ZESTRIL) 20 MG tablet Take 20 mg by mouth daily.      [name] oxyCODONE (ROXICODONE) 15 MG immediate release tablet Take 15 mg by mouth every 4 (four) hours as needed for pain.      . pantoprazole (PROTONIX) 40 MG tablet Take  1 tablet (40 mg total) by mouth 2 (two) times daily.  60 tablet  0  . predniSONE (DELTASONE) 10 MG tablet Take 10 mg by mouth daily. Based on taper schedule. Has 2 more days left in pack. Currently 10 mg once daily      . pregabalin (LYRICA) 50 MG capsule Take 50 mg by mouth at bedtime.      . promethazine (PHENERGAN) 25 MG tablet Take 25 mg by mouth every 6 (six) hours as needed. For nausea      . Simethicone (GAS-X EXTRA STRENGTH) 125 MG CAPS Take 2 capsules by mouth every morning.      . SUMAtriptan (IMITREX) 25 MG tablet Take 25 mg by mouth every 2 (two) hours as needed.      . tiotropium (SPIRIVA) 18 MCG inhalation capsule Place 18 mcg into inhaler and inhale every morning.           NGE:XBMWU from the symptoms mentioned above,there are no other  symptoms referable to all systems reviewed.  Physical Exam: Blood pressure 107/70, pulse 107, temperature 98.2 F (36.8 C), temperature source Oral, resp. rate 20, height 5' (1.524 m), weight 73 kg (160 lb 15 oz), SpO2 93.00%. She is awake and alert. She appears to be in moderate distress. She is coughing during the examination. She looks acutely ill. Her mucous membranes are dry. Her neck is supple without masses bruits or JVD. Her chest shows bilateral rhonchi and wheezes. Her heart is regular without gallop. Her abdomen is soft without masses. Extremities showed no edema. Central nervous system exam is grossly intact    Recent Labs  04/21/13 1140  WBC 15.0*  NEUTROABS 13.1*  HGB 12.8  HCT 38.7  MCV 85.2  PLT 440*    Recent Labs  04/21/13 1140  NA 135  K 3.7  CL 94*  CO2 33*  GLUCOSE 306*  BUN 9  CREATININE 0.46*  CALCIUM 9.4  lablast2(ast:2,ALT:2,alkphos:2,bilitot:2,prot:2,albumin:2)@    Recent Results (from the past 240 hour(s))  CULTURE, BLOOD (ROUTINE X 2)     Status: None   Collection Time    04/21/13 11:47 AM      Result Value Range Status   Specimen Description Blood RIGHT ARM   Final   Special Requests BOTTLES DRAWN AEROBIC AND ANAEROBIC 8 CC EACH   Final   Culture PENDING   Incomplete   Report Status PENDING   Incomplete  CULTURE, BLOOD (ROUTINE X 2)     Status: None   Collection Time    04/21/13 11:52 AM      Result Value Range Status   Specimen Description Blood LEFT ARM   Final   Special Requests BOTTLES DRAWN AEROBIC AND ANAEROBIC 8 CC EACH   Final   Culture PENDING   Incomplete   Report Status PENDING   Incomplete  MRSA PCR SCREENING     Status: None   Collection Time    04/21/13 12:07 PM      Result Value Range Status   MRSA by PCR NEGATIVE  NEGATIVE Final   Comment:            The GeneXpert MRSA Assay (FDA     approved for NASAL specimens     only), is one component of a     comprehensive MRSA colonization     surveillance program. It is  not     intended to diagnose MRSA     infection nor to guide or     monitor  treatment for     MRSA infections.     X-ray Chest Pa And Lateral   04/21/2013   *RADIOLOGY REPORT*  Clinical Data: Cough and shortness of breath.  COPD.  CHEST - 2 VIEW  Comparison: PA and lateral chest 03/31/2013 and 11/18/2012.  Findings: Lungs are clear.  Heart size is normal.  No pneumothorax or pleural fluid.  IMPRESSION: No acute disease.   Original Report Authenticated By: Holley Dexter, M.D.   Dg Chest 2 View  03/31/2013   *RADIOLOGY REPORT*  Clinical Data: Cough, respiratory infection  CHEST - 2 VIEW  Comparison: 11/30/2012  Findings: Normal heart size and vascularity.  Stable chronic interstitial changes as before without superimposed pneumonia or edema.  No effusion or pneumothorax.  Trachea is midline.  No significant interval change.  IMPRESSION: Chronic interstitial changes.  Stable exam.   Original Report Authenticated By: Judie Petit. Miles Costain, M.D.   Impression: She has COPD exacerbation. She has multiple other medical problems. She has failed outpatient treatment Active Problems:   * No active hospital problems. *     Plan: She will be admitted for IV steroids antibiotics et Karie Soda.      Amena Dockham L Pager [phone]  04/22/2013, 8:46 AM

## 2013-04-22 NOTE — Progress Notes (Signed)
Subjective: She looks much better. She's less congested. She says her pain is better controlled.  Objective: Vital signs in last 24 hours: Temp:  [97.7 F (36.5 C)-98.5 F (36.9 C)] 98.2 F (36.8 C) (05/31 0800) Pulse Rate:  [79-107] 107 (05/31 0800) Resp:  [19-24] 20 (05/31 0800) BP: (107-127)/(56-76) 107/70 mmHg (05/31 0800) SpO2:  [92 %-97 %] 93 % (05/31 0833) Weight:  [73 kg (160 lb 15 oz)] 73 kg (160 lb 15 oz) (05/30 1200) Weight change:  Last BM Date: 04/20/13  Intake/Output from previous day: 05/30 0701 - 05/31 0700 In: 1297.5 [I.V.:1297.5] Out: 2100 [Urine:2100]  PHYSICAL EXAM General appearance: alert, cooperative and mild distress Resp: rhonchi bilaterally Cardio: regular rate and rhythm, S1, S2 normal, no murmur, click, rub or gallop GI: soft, non-tender; bowel sounds normal; no masses,  no organomegaly Extremities: extremities normal, atraumatic, no cyanosis or edema  Lab Results:    Basic Metabolic Panel:  Recent Labs  16/10/96 1140  NA 135  K 3.7  CL 94*  CO2 33*  GLUCOSE 306*  BUN 9  CREATININE 0.46*  CALCIUM 9.4   Liver Function Tests:  Recent Labs  04/21/13 1140  AST 13  ALT 10  ALKPHOS 68  BILITOT 0.2*  PROT 7.1  ALBUMIN 3.4*   No results found for this basename: LIPASE, AMYLASE,  in the last 72 hours No results found for this basename: AMMONIA,  in the last 72 hours CBC:  Recent Labs  04/21/13 1140  WBC 15.0*  NEUTROABS 13.1*  HGB 12.8  HCT 38.7  MCV 85.2  PLT 440*   Cardiac Enzymes: No results found for this basename: CKTOTAL, CKMB, CKMBINDEX, TROPONINI,  in the last 72 hours BNP: No results found for this basename: PROBNP,  in the last 72 hours D-Dimer: No results found for this basename: DDIMER,  in the last 72 hours CBG:  Recent Labs  04/21/13 1645 04/21/13 2120 04/22/13 0735  GLUCAP 286* 298* 305*   Hemoglobin A1C: No results found for this basename: HGBA1C,  in the last 72 hours Fasting Lipid Panel: No  results found for this basename: CHOL, HDL, LDLCALC, TRIG, CHOLHDL, LDLDIRECT,  in the last 72 hours Thyroid Function Tests: No results found for this basename: TSH, T4TOTAL, FREET4, T3FREE, THYROIDAB,  in the last 72 hours Anemia Panel: No results found for this basename: VITAMINB12, FOLATE, FERRITIN, TIBC, IRON, RETICCTPCT,  in the last 72 hours Coagulation: No results found for this basename: LABPROT, INR,  in the last 72 hours Urine Drug Screen: Drugs of Abuse     Component Value Date/Time   LABOPIA  Value: POSITIVE (NOTE) Sent for confirmatory testing Result repeated and verified.* 02/26/2011 2234   LABOPIA POSITIVE* 05/01/2010 1819   COCAINSCRNUR NEGATIVE 02/26/2011 2234   COCAINSCRNUR NONE DETECTED 05/01/2010 1819   LABBENZ  Value: POSITIVE QUANTITY NOT SUFFICIENT, UNABLE TO PERFORM TEST Unable to perform confirmation testing per [facility] (NOTE) Sent for confirmatory testing Result repeated and verified.* 02/26/2011 2234   LABBENZ POSITIVE* 05/01/2010 1819   AMPHETMU NEGATIVE 02/26/2011 2234   AMPHETMU NONE DETECTED 05/01/2010 1819   THCU POSITIVE* 05/01/2010 1819   LABBARB  Value: NONE DETECTED        DRUG SCREEN FOR MEDICAL PURPOSES ONLY.  IF CONFIRMATION IS NEEDED FOR ANY PURPOSE, NOTIFY LAB WITHIN 5 DAYS.        LOWEST DETECTABLE LIMITS FOR URINE DRUG SCREEN Drug Class       Cutoff (ng/mL) Amphetamine  1000 Barbiturate      200 Benzodiazepine   200 Tricyclics       300 Opiates          300 Cocaine          300 THC              50 05/01/2010 1819    Alcohol Level: No results found for this basename: ETH,  in the last 72 hours Urinalysis: No results found for this basename: COLORURINE, APPERANCEUR, LABSPEC, PHURINE, GLUCOSEU, HGBUR, BILIRUBINUR, KETONESUR, PROTEINUR, UROBILINOGEN, NITRITE, LEUKOCYTESUR,  in the last 72 hours Misc. Labs:  ABGS No results found for this basename: PHART, PCO2, PO2ART, TCO2, HCO3,  in the last 72 hours CULTURES Recent Results (from the past 240  hour(s))  CULTURE, BLOOD (ROUTINE X 2)     Status: None   Collection Time    04/21/13 11:47 AM      Result Value Range Status   Specimen Description Blood RIGHT ARM   Final   Special Requests BOTTLES DRAWN AEROBIC AND ANAEROBIC 8 CC EACH   Final   Culture PENDING   Incomplete   Report Status PENDING   Incomplete  CULTURE, BLOOD (ROUTINE X 2)     Status: None   Collection Time    04/21/13 11:52 AM      Result Value Range Status   Specimen Description Blood LEFT ARM   Final   Special Requests BOTTLES DRAWN AEROBIC AND ANAEROBIC 8 CC EACH   Final   Culture PENDING   Incomplete   Report Status PENDING   Incomplete  MRSA PCR SCREENING     Status: None   Collection Time    04/21/13 12:07 PM      Result Value Range Status   MRSA by PCR NEGATIVE  NEGATIVE Final   Comment:            The GeneXpert MRSA Assay (FDA     approved for NASAL specimens     only), is one component of a     comprehensive MRSA colonization     surveillance program. It is not     intended to diagnose MRSA     infection nor to guide or     monitor treatment for     MRSA infections.   Studies/Results: X-ray Chest Pa And Lateral   04/21/2013   *RADIOLOGY REPORT*  Clinical Data: Cough and shortness of breath.  COPD.  CHEST - 2 VIEW  Comparison: PA and lateral chest 03/31/2013 and 11/18/2012.  Findings: Lungs are clear.  Heart size is normal.  No pneumothorax or pleural fluid.  IMPRESSION: No acute disease.   Original Report Authenticated By: Holley Dexter, M.D.    Medications:  Prior to Admission:  Prescriptions prior to admission  Medication Sig Dispense Refill  . albuterol (PROAIR HFA) 108 (90 BASE) MCG/ACT inhaler Inhale 2 puffs into the lungs every 6 (six) hours as needed. For shortness of breATH      . albuterol (PROVENTIL) (2.5 MG/3ML) 0.083% nebulizer solution Take 2.5 mg by nebulization every 4 (four) hours as needed. For shortness of breath       . budesonide-formoterol (SYMBICORT) 160-4.5 MCG/ACT  inhaler Inhale 2 puffs into the lungs 2 (two) times daily.      . Choline Fenofibrate (TRILIPIX) 135 MG capsule Take 135 mg by mouth every morning.       . Diphenhyd-Hydrocort-Nystatin (FIRST-DUKES MOUTHWASH MT) Use as directed 5 mLs in the mouth or throat daily  as needed. FOR THRUSH      . docusate sodium (COLACE) 100 MG capsule Take 200 mg by mouth every morning.      . fluticasone (FLONASE) 50 MCG/ACT nasal spray Place 2 sprays into the nose daily.      [name] lisinopril (PRINIVIL,ZESTRIL) 20 MG tablet Take 20 mg by mouth daily.      [name] oxyCODONE (ROXICODONE) 15 MG immediate release tablet Take 15 mg by mouth every 4 (four) hours as needed for pain.      . pantoprazole (PROTONIX) 40 MG tablet Take 1 tablet (40 mg total) by mouth 2 (two) times daily.  60 tablet  0  . predniSONE (DELTASONE) 10 MG tablet Take 10 mg by mouth daily. Based on taper schedule. Has 2 more days left in pack. Currently 10 mg once daily      . pregabalin (LYRICA) 50 MG capsule Take 50 mg by mouth at bedtime.      . promethazine (PHENERGAN) 25 MG tablet Take 25 mg by mouth every 6 (six) hours as needed. For nausea      . Simethicone (GAS-X EXTRA STRENGTH) 125 MG CAPS Take 2 capsules by mouth every morning.      . SUMAtriptan (IMITREX) 25 MG tablet Take 25 mg by mouth every 2 (two) hours as needed.      . tiotropium (SPIRIVA) 18 MCG inhalation capsule Place 18 mcg into inhaler and inhale every morning.       Scheduled: . albuterol  2.5 mg Nebulization Q4H  . budesonide-formoterol  2 puff Inhalation BID  . cefTRIAXone (ROCEPHIN)  IV  1 g Intravenous Q24H  . docusate sodium  200 mg Oral q morning - 10a  . enoxaparin (LOVENOX) injection  40 mg Subcutaneous Q24H  . fenofibrate  160 mg Oral Daily  . fluconazole  100 mg Oral Daily  . fluticasone  2 spray Each Nare Daily  . guaiFENesin  1,200 mg Oral BID  . insulin aspart  0-20 Units Subcutaneous TID WC  . insulin aspart  0-5 Units Subcutaneous QHS  . ipratropium  0.5 mg  Nebulization Q4H  . lisinopril  20 mg Oral Daily  . methylPREDNISolone (SOLU-MEDROL) injection  125 mg Intravenous Q6H  . OxyCODONE  10 mg Oral Q6H  . pantoprazole  40 mg Oral BID  . pregabalin  50 mg Oral QHS  . simethicone  240 mg Oral Daily  . sodium chloride  3 mL Intravenous Q12H   Continuous: . sodium chloride 75 mL/hr at 04/22/13 0300   ZOX:[id number], acetaminophen, alum & mag hydroxide-simeth, ondansetron (ZOFRAN) IV, ondansetron, oxyCODONE, oxyCODONE-acetaminophen, promethazine  Assesment: She was admitted with COPD exacerbation. She is much improved. She has diabetes. She has chronic pain and says her pain is better Principal Problem:   COPD exacerbation Active Problems:   Low back pain   Thrush, oral   DM (diabetes mellitus)   GERD (gastroesophageal reflux disease)    Plan: Continue current treatments I don't think she'll have to be in the hospital too long.    LOS: 1 day   Jaquane Boughner L 04/22/2013, 9:40 AM

## 2013-04-23 LAB — GLUCOSE, CAPILLARY
Glucose-Capillary: 277 mg/dL — ABNORMAL HIGH (ref 70–99)
Glucose-Capillary: 250 mg/dL — ABNORMAL HIGH (ref 70–99)
Glucose-Capillary: 320 mg/dL — ABNORMAL HIGH (ref 70–99)
Glucose-Capillary: 231 mg/dL — ABNORMAL HIGH (ref 70–99)

## 2013-04-23 MED ORDER — SENNOSIDES-DOCUSATE SODIUM 8.6-50 MG PO TABS
2.0000 | ORAL_TABLET | Freq: Two times a day (BID) | ORAL | Status: DC
  Administered 2013-04-23 – 2013-04-24 (×3): 2 via ORAL
  Filled 2013-04-23 (×3): qty 2

## 2013-04-23 NOTE — Progress Notes (Signed)
Subjective: She says she feels better. She is having some trouble having a bowel movement but otherwise is doing well. Her breathing is better. She's coughing and able to cough up some sputum  Objective: Vital signs in last 24 hours: Temp:  [98 F (36.7 C)-98.4 F (36.9 C)] 98.4 F (36.9 C) (06/01 0548) Pulse Rate:  [102-111] 108 (06/01 0548) Resp:  [16-20] 16 (06/01 0548) BP: (122-126)/(49-86) 122/86 mmHg (06/01 0548) SpO2:  [94 %-98 %] 95 % (06/01 0753) Weight:  [78.1 kg (172 lb 2.9 oz)] 78.1 kg (172 lb 2.9 oz) (06/01 0548) Weight change: 5.1 kg (11 lb 3.9 oz) Last BM Date: 04/20/13  Intake/Output from previous day: 05/31 0701 - 06/01 0700 In: 2600 [P.O.:2600] Out: 2400 [Urine:2400]  PHYSICAL EXAM General appearance: alert, cooperative and mild distress Resp: rhonchi bilaterally Cardio: regular rate and rhythm, S1, S2 normal, no murmur, click, rub or gallop GI: soft, non-tender; bowel sounds normal; no masses,  no organomegaly Extremities: extremities normal, atraumatic, no cyanosis or edema  Lab Results:    Basic Metabolic Panel:  Recent Labs  16/10/96 1140  NA 135  K 3.7  CL 94*  CO2 33*  GLUCOSE 306*  BUN 9  CREATININE 0.46*  CALCIUM 9.4   Liver Function Tests:  Recent Labs  04/21/13 1140  AST 13  ALT 10  ALKPHOS 68  BILITOT 0.2*  PROT 7.1  ALBUMIN 3.4*   No results found for this basename: LIPASE, AMYLASE,  in the last 72 hours No results found for this basename: AMMONIA,  in the last 72 hours CBC:  Recent Labs  04/21/13 1140  WBC 15.0*  NEUTROABS 13.1*  HGB 12.8  HCT 38.7  MCV 85.2  PLT 440*   Cardiac Enzymes: No results found for this basename: CKTOTAL, CKMB, CKMBINDEX, TROPONINI,  in the last 72 hours BNP: No results found for this basename: PROBNP,  in the last 72 hours D-Dimer: No results found for this basename: DDIMER,  in the last 72 hours CBG:  Recent Labs  04/21/13 2120 04/22/13 0735 04/22/13 1131 04/22/13 1622  04/22/13 2303 04/23/13 0747  GLUCAP 298* 305* 262* 314* 256* 277*   Hemoglobin A1C: No results found for this basename: HGBA1C,  in the last 72 hours Fasting Lipid Panel: No results found for this basename: CHOL, HDL, LDLCALC, TRIG, CHOLHDL, LDLDIRECT,  in the last 72 hours Thyroid Function Tests: No results found for this basename: TSH, T4TOTAL, FREET4, T3FREE, THYROIDAB,  in the last 72 hours Anemia Panel: No results found for this basename: VITAMINB12, FOLATE, FERRITIN, TIBC, IRON, RETICCTPCT,  in the last 72 hours Coagulation: No results found for this basename: LABPROT, INR,  in the last 72 hours Urine Drug Screen: Drugs of Abuse     Component Value Date/Time   LABOPIA  Value: POSITIVE (NOTE) Sent for confirmatory testing Result repeated and verified.* 02/26/2011 2234   LABOPIA POSITIVE* 05/01/2010 1819   COCAINSCRNUR NEGATIVE 02/26/2011 2234   COCAINSCRNUR NONE DETECTED 05/01/2010 1819   LABBENZ  Value: POSITIVE QUANTITY NOT SUFFICIENT, UNABLE TO PERFORM TEST Unable to perform confirmation testing per [facility] (NOTE) Sent for confirmatory testing Result repeated and verified.* 02/26/2011 2234   LABBENZ POSITIVE* 05/01/2010 1819   AMPHETMU NEGATIVE 02/26/2011 2234   AMPHETMU NONE DETECTED 05/01/2010 1819   THCU POSITIVE* 05/01/2010 1819   LABBARB  Value: NONE DETECTED        DRUG SCREEN FOR MEDICAL PURPOSES ONLY.  IF CONFIRMATION IS NEEDED FOR ANY PURPOSE, NOTIFY LAB  WITHIN 5 DAYS.        LOWEST DETECTABLE LIMITS FOR URINE DRUG SCREEN Drug Class       Cutoff (ng/mL) Amphetamine      1000 Barbiturate      200 Benzodiazepine   200 Tricyclics       300 Opiates          300 Cocaine          300 THC              50 05/01/2010 1819    Alcohol Level: No results found for this basename: ETH,  in the last 72 hours Urinalysis: No results found for this basename: COLORURINE, APPERANCEUR, LABSPEC, PHURINE, GLUCOSEU, HGBUR, BILIRUBINUR, KETONESUR, PROTEINUR, UROBILINOGEN, NITRITE, LEUKOCYTESUR,   in the last 72 hours Misc. Labs:  ABGS No results found for this basename: PHART, PCO2, PO2ART, TCO2, HCO3,  in the last 72 hours CULTURES Recent Results (from the past 240 hour(s))  CULTURE, BLOOD (ROUTINE X 2)     Status: None   Collection Time    04/21/13 11:47 AM      Result Value Range Status   Specimen Description Blood RIGHT ARM   Final   Special Requests BOTTLES DRAWN AEROBIC AND ANAEROBIC 8 CC EACH   Final   Culture NO GROWTH 2 DAYS   Final   Report Status PENDING   Incomplete  CULTURE, BLOOD (ROUTINE X 2)     Status: None   Collection Time    04/21/13 11:52 AM      Result Value Range Status   Specimen Description Blood LEFT ARM   Final   Special Requests BOTTLES DRAWN AEROBIC AND ANAEROBIC 8 CC EACH   Final   Culture NO GROWTH 2 DAYS   Final   Report Status PENDING   Incomplete  MRSA PCR SCREENING     Status: None   Collection Time    04/21/13 12:07 PM      Result Value Range Status   MRSA by PCR NEGATIVE  NEGATIVE Final   Comment:            The GeneXpert MRSA Assay (FDA     approved for NASAL specimens     only), is one component of a     comprehensive MRSA colonization     surveillance program. It is not     intended to diagnose MRSA     infection nor to guide or     monitor treatment for     MRSA infections.  CULTURE, EXPECTORATED SPUTUM-ASSESSMENT     Status: None   Collection Time    04/22/13  8:44 AM      Result Value Range Status   Specimen Description SPUTUM   Final   Special Requests Normal   Final   Sputum evaluation     Final   Value: THIS SPECIMEN IS ACCEPTABLE. RESPIRATORY CULTURE REPORT TO FOLLOW.   Report Status 04/22/2013 FINAL   Final   Studies/Results: X-ray Chest Pa And Lateral   04/21/2013   *RADIOLOGY REPORT*  Clinical Data: Cough and shortness of breath.  COPD.  CHEST - 2 VIEW  Comparison: PA and lateral chest 03/31/2013 and 11/18/2012.  Findings: Lungs are clear.  Heart size is normal.  No pneumothorax or pleural fluid.  IMPRESSION:  No acute disease.   Original Report Authenticated By: Holley Dexter, M.D.    Medications:  Prior to Admission:  Prescriptions prior to admission  Medication Sig Dispense Refill  .  albuterol (PROAIR HFA) 108 (90 BASE) MCG/ACT inhaler Inhale 2 puffs into the lungs every 6 (six) hours as needed. For shortness of breATH      . albuterol (PROVENTIL) (2.5 MG/3ML) 0.083% nebulizer solution Take 2.5 mg by nebulization every 4 (four) hours as needed. For shortness of breath       . budesonide-formoterol (SYMBICORT) 160-4.5 MCG/ACT inhaler Inhale 2 puffs into the lungs 2 (two) times daily.      . Choline Fenofibrate (TRILIPIX) 135 MG capsule Take 135 mg by mouth every morning.       . Diphenhyd-Hydrocort-Nystatin (FIRST-DUKES MOUTHWASH MT) Use as directed 5 mLs in the mouth or throat daily as needed. FOR THRUSH      . docusate sodium (COLACE) 100 MG capsule Take 200 mg by mouth every morning.      . fluticasone (FLONASE) 50 MCG/ACT nasal spray Place 2 sprays into the nose daily.      [name] lisinopril (PRINIVIL,ZESTRIL) 20 MG tablet Take 20 mg by mouth daily.      [name] oxyCODONE (ROXICODONE) 15 MG immediate release tablet Take 15 mg by mouth every 4 (four) hours as needed for pain.      . pantoprazole (PROTONIX) 40 MG tablet Take 1 tablet (40 mg total) by mouth 2 (two) times daily.  60 tablet  0  . predniSONE (DELTASONE) 10 MG tablet Take 10 mg by mouth daily. Based on taper schedule. Has 2 more days left in pack. Currently 10 mg once daily      . pregabalin (LYRICA) 50 MG capsule Take 50 mg by mouth at bedtime.      . promethazine (PHENERGAN) 25 MG tablet Take 25 mg by mouth every 6 (six) hours as needed. For nausea      . Simethicone (GAS-X EXTRA STRENGTH) 125 MG CAPS Take 2 capsules by mouth every morning.      . SUMAtriptan (IMITREX) 25 MG tablet Take 25 mg by mouth every 2 (two) hours as needed.      . tiotropium (SPIRIVA) 18 MCG inhalation capsule Place 18 mcg into inhaler and inhale every morning.        Scheduled: . albuterol  2.5 mg Nebulization Q4H  . budesonide-formoterol  2 puff Inhalation BID  . cefTRIAXone (ROCEPHIN)  IV  1 g Intravenous Q24H  . enoxaparin (LOVENOX) injection  40 mg Subcutaneous Q24H  . fenofibrate  160 mg Oral Daily  . fluconazole  100 mg Oral Daily  . fluticasone  2 spray Each Nare Daily  . guaiFENesin  1,200 mg Oral BID  . insulin aspart  0-20 Units Subcutaneous TID WC  . insulin aspart  0-5 Units Subcutaneous QHS  . ipratropium  0.5 mg Nebulization Q4H  . lisinopril  20 mg Oral Daily  . methylPREDNISolone (SOLU-MEDROL) injection  125 mg Intravenous Q6H  . OxyCODONE  10 mg Oral Q6H  . pantoprazole  40 mg Oral BID  . pregabalin  50 mg Oral QHS  . senna-docusate  2 tablet Oral BID  . simethicone  240 mg Oral Daily  . sodium chloride  3 mL Intravenous Q12H   Continuous: . sodium chloride 75 mL/hr at 04/23/13 0600   ZOX:[id number], acetaminophen, alum & mag hydroxide-simeth, chlorpheniramine-HYDROcodone, LORazepam, ondansetron (ZOFRAN) IV, ondansetron, oxyCODONE, oxyCODONE-acetaminophen, promethazine, SUMAtriptan  Assesment: She has COPD exacerbation. She has chronic low back pain that seems better. Her blood sugar is doing okay. She has oral thrush. Her COPD has improved significantly Principal Problem:   COPD exacerbation Active Problems:  Low back pain   Thrush, oral   DM (diabetes mellitus)   GERD (gastroesophageal reflux disease)    Plan: Continue with current treatments. Because she's having some constipation I will add Peri- Colace. Potential discharge in the next 48 hours    LOS: 2 days   Iowa Kappes L 04/23/2013, 9:41 AM

## 2013-04-24 LAB — GLUCOSE, CAPILLARY: Glucose-Capillary: 336 mg/dL — ABNORMAL HIGH (ref 70–99)

## 2013-04-24 MED ORDER — CEFPROZIL 500 MG PO TABS: 500.0000 mg | ORAL_TABLET | Freq: Two times a day (BID) | ORAL | Status: DC

## 2013-04-24 MED ORDER — OXYCODONE HCL ER 10 MG PO T12A
10.0000 mg | EXTENDED_RELEASE_TABLET | Freq: Two times a day (BID) | ORAL | Status: DC
Start: 2013-04-24 — End: 2020-04-23

## 2013-04-24 MED ORDER — LORAZEPAM 0.5 MG PO TABS: 0.5000 mg | ORAL_TABLET | Freq: Four times a day (QID) | ORAL | Status: DC | PRN

## 2013-04-24 MED ORDER — PREDNISONE 10 MG PO TABS: 10.0000 mg | ORAL_TABLET | Freq: Every day | ORAL | Status: DC

## 2013-04-24 NOTE — Progress Notes (Signed)
Patient discharged with instructions on follow up appointments,and medications,patient verbalized understanding.Prescriptions sent with patient.Vital signs stable. Discharged with oxygen at 3 liters nasal canula. Accompanied by staff to an awaiting vehicle.

## 2013-04-24 NOTE — Care Management Note (Signed)
    Page 1 of 1   04/24/2013     12:27:19 PM   CARE MANAGEMENT NOTE 04/24/2013  Patient:  [name], [name]   Account Number:  [id number]  Date Initiated:  04/24/2013  Documentation initiated by:  Sharrie Rothman  Subjective/Objective Assessment:   Pt admitted from home with COPD. Pt lives with her family and will return home at discharge. Pt is independent with ADL's. Pt receives home O2 with [facility].     Action/Plan:   PT chose [facility] for Rn. Alroy Bailiff of [facility] is aware and will collect the pts information from the chart. HH services to start within 48 hours. No DME needs noted.   Anticipated DC Date:  04/24/2013   Anticipated DC Plan:  HOME W HOME HEALTH SERVICES      DC Planning Services  CM consult      [facility] Choice  HOME HEALTH   Choice offered to / List presented to:  C-1 Patient        HH arranged  HH-1 RN      [facility] agency  [facility]   Status of service:  Completed, signed off Medicare Important Message given?   (If response is "NO", the following Medicare IM given date fields will be blank) Date Medicare IM given:   Date Additional Medicare IM given:    Discharge Disposition:  HOME W HOME HEALTH SERVICES  Per UR Regulation:    If discussed at Long Length of Stay Meetings, dates discussed:    Comments:  04/24/13 1226 Arlyss Queen, RN BSN CM

## 2013-04-24 NOTE — Discharge Summary (Signed)
Physician Discharge Summary  Patient ID: [name] MRN: [id number] DOB/AGE: 1965/12/17 47 y.o. Primary Care Physician:Brinly Maietta L, MD Admit date: 04/21/2013 Discharge date: 04/24/2013    Discharge Diagnoses:   Principal Problem:   COPD exacerbation Active Problems:   Low back pain   Thrush, oral   DM (diabetes mellitus)   GERD (gastroesophageal reflux disease)     Medication List    STOP taking these medications       oxyCODONE 10 MG 12 hr tablet  Commonly known as:  OXYCONTIN  Replaced by:  OxyCODONE 10 mg T12a      TAKE these medications       albuterol (2.5 MG/3ML) 0.083% nebulizer solution  Commonly known as:  PROVENTIL  Take 2.5 mg by nebulization every 4 (four) hours as needed. For shortness of breath     PROAIR HFA 108 (90 BASE) MCG/ACT inhaler  Generic drug:  albuterol  Inhale 2 puffs into the lungs every 6 (six) hours as needed. For shortness of breATH     budesonide-formoterol 160-4.5 MCG/ACT inhaler  Commonly known as:  SYMBICORT  Inhale 2 puffs into the lungs 2 (two) times daily.     cefPROZIL 500 MG tablet  Commonly known as:  CEFZIL  Take 1 tablet (500 mg total) by mouth 2 (two) times daily.     docusate sodium 100 MG capsule  Commonly known as:  COLACE  Take 200 mg by mouth every morning.     FIRST-DUKES MOUTHWASH MT  Use as directed 5 mLs in the mouth or throat daily as needed. FOR THRUSH     fluticasone 50 MCG/ACT nasal spray  Commonly known as:  FLONASE  Place 2 sprays into the nose daily.     GAS-X EXTRA STRENGTH 125 MG Caps  Generic drug:  Simethicone  Take 2 capsules by mouth every morning.     lisinopril 20 MG tablet  Commonly known as:  PRINIVIL,ZESTRIL  Take 20 mg by mouth daily.     LORazepam 0.5 MG tablet  Commonly known as:  ATIVAN  Take 1 tablet (0.5 mg total) by mouth every 6 (six) hours as needed for anxiety.     OxyCODONE 10 mg T12a  Commonly known as:  OXYCONTIN  Take 1 tablet (10 mg total) by mouth every  12 (twelve) hours.     oxyCODONE 15 MG immediate release tablet  Commonly known as:  ROXICODONE  Take 15 mg by mouth every 4 (four) hours as needed for pain.     pantoprazole 40 MG tablet  Commonly known as:  PROTONIX  Take 1 tablet (40 mg total) by mouth 2 (two) times daily.     predniSONE 10 MG tablet  Commonly known as:  DELTASONE  Take 1 tablet (10 mg total) by mouth daily. Please restart prednisone taper. 4 daily for 3 days, 3 daily for 3 days, 2 daily for 3 days, then one daily for 3 days and then stop     pregabalin 50 MG capsule  Commonly known as:  LYRICA  Take 50 mg by mouth at bedtime.     promethazine 25 MG tablet  Commonly known as:  PHENERGAN  Take 25 mg by mouth every 6 (six) hours as needed. For nausea     SUMAtriptan 25 MG tablet  Commonly known as:  IMITREX  Take 25 mg by mouth every 2 (two) hours as needed.     tiotropium 18 MCG inhalation capsule  Commonly known as:  SPIRIVA  Place 18 mcg into inhaler and inhale every morning.     TRILIPIX 135 MG capsule  Generic drug:  Choline Fenofibrate  Take 135 mg by mouth every morning.        Discharged Condition: Improved    Consults: None  Significant Diagnostic Studies: X-ray Chest Pa And Lateral   04/21/2013   *RADIOLOGY REPORT*  Clinical Data: Cough and shortness of breath.  COPD.  CHEST - 2 VIEW  Comparison: PA and lateral chest 03/31/2013 and 11/18/2012.  Findings: Lungs are clear.  Heart size is normal.  No pneumothorax or pleural fluid.  IMPRESSION: No acute disease.   Original Report Authenticated By: Holley Dexter, M.D.   Dg Chest 2 View  03/31/2013   *RADIOLOGY REPORT*  Clinical Data: Cough, respiratory infection  CHEST - 2 VIEW  Comparison: 11/30/2012  Findings: Normal heart size and vascularity.  Stable chronic interstitial changes as before without superimposed pneumonia or edema.  No effusion or pneumothorax.  Trachea is midline.  No significant interval change.  IMPRESSION: Chronic  interstitial changes.  Stable exam.   Original Report Authenticated By: Judie Petit. Miles Costain, M.D.    Lab Results: Basic Metabolic Panel:  Recent Labs  11/91/47 1140  NA 135  K 3.7  CL 94*  CO2 33*  GLUCOSE 306*  BUN 9  CREATININE 0.46*  CALCIUM 9.4   Liver Function Tests:  Recent Labs  04/21/13 1140  AST 13  ALT 10  ALKPHOS 68  BILITOT 0.2*  PROT 7.1  ALBUMIN 3.4*     CBC:  Recent Labs  04/21/13 1140  WBC 15.0*  NEUTROABS 13.1*  HGB 12.8  HCT 38.7  MCV 85.2  PLT 440*    Recent Results (from the past 240 hour(s))  CULTURE, BLOOD (ROUTINE X 2)     Status: None   Collection Time    04/21/13 11:47 AM      Result Value Range Status   Specimen Description Blood RIGHT ARM   Final   Special Requests BOTTLES DRAWN AEROBIC AND ANAEROBIC 8 CC EACH   Final   Culture NO GROWTH 2 DAYS   Final   Report Status PENDING   Incomplete  CULTURE, BLOOD (ROUTINE X 2)     Status: None   Collection Time    04/21/13 11:52 AM      Result Value Range Status   Specimen Description Blood LEFT ARM   Final   Special Requests BOTTLES DRAWN AEROBIC AND ANAEROBIC 8 CC EACH   Final   Culture NO GROWTH 2 DAYS   Final   Report Status PENDING   Incomplete  MRSA PCR SCREENING     Status: None   Collection Time    04/21/13 12:07 PM      Result Value Range Status   MRSA by PCR NEGATIVE  NEGATIVE Final   Comment:            The GeneXpert MRSA Assay (FDA     approved for NASAL specimens     only), is one component of a     comprehensive MRSA colonization     surveillance program. It is not     intended to diagnose MRSA     infection nor to guide or     monitor treatment for     MRSA infections.  CULTURE, EXPECTORATED SPUTUM-ASSESSMENT     Status: None   Collection Time    04/22/13  8:44 AM      Result Value Range Status  Specimen Description SPUTUM   Final   Special Requests Normal   Final   Sputum evaluation     Final   Value: THIS SPECIMEN IS ACCEPTABLE. RESPIRATORY CULTURE REPORT TO  FOLLOW.   Report Status 04/22/2013 FINAL   Final  CULTURE, RESPIRATORY (NON-EXPECTORATED)     Status: None   Collection Time    04/22/13  8:44 AM      Result Value Range Status   Specimen Description SPUTUM   Final   Special Requests NONE   Final   Gram Stain PENDING   Incomplete   Culture NORMAL OROPHARYNGEAL FLORA   Final   Report Status PENDING   Incomplete     Hospital Course: She was admitted from my office with COPD exacerbation. She was started on IV steroids and Levaquin. Although she had taken Levaquin orally in the past she had a reaction to it mostly a local reaction at the injection site but this was discontinued and she was switched to Rocephin. She tolerated that well. She improved rapidly and was back to her baseline by the time of discharge. She is still short of breath with exertion still has some cough and congestion but that is her baseline  Discharge Exam: Blood pressure 123/84, pulse 98, temperature 98 F (36.7 C), temperature source Oral, resp. rate 16, height 5' (1.524 m), weight 78.1 kg (172 lb 2.9 oz), SpO2 96.00%. She is awake and alert. Her chest is much clearer but still has some rhonchi. Her heart is regular. Her abdomen is soft.  Disposition: Home with home health services      Discharge Orders   Future Orders Complete By Expires     Discharge patient  As directed     Face-to-face encounter (required for Medicare/Medicaid patients)  As directed     Comments:      I Leyton Brownlee L certify that this patient is under my care and that I, or a nurse practitioner or physician's assistant working with me, had a face-to-face encounter that meets the physician face-to-face encounter requirements with this patient on 04/24/2013. The encounter with the patient was in whole, or in part for the following medical condition(s) which is the primary reason for home health care (List medical condition): COPD exacerbation    Questions:      The encounter with the patient was  in whole, or in part, for the following medical condition, which is the primary reason for home health care:  COPD exacerbation    I certify that, based on my findings, the following services are medically necessary home health services:  Nursing    My clinical findings support the need for the above services:  Shortness of breath with activity    Further, I certify that my clinical findings support that this patient is homebound due to:  Shortness of Breath with activity    Reason for Medically Necessary Home Health Services:  Skilled Nursing- Change/Decline in Patient Status    Home Health  As directed     Questions:      To provide the following care/treatments:  RN         Signed: Patryce Depriest Elbert Ewings Pager [phone]  04/24/2013, 8:59 AM

## 2013-04-24 NOTE — Progress Notes (Signed)
Subjective: She feels well and has no new complaints. She wants to go home. Her breathing is much improved  Objective: Vital signs in last 24 hours: Temp:  [97.9 F (36.6 C)-98.3 F (36.8 C)] 98 F (36.7 C) (06/02 0500) Pulse Rate:  [98-102] 98 (06/02 0500) Resp:  [16-20] 16 (06/02 0500) BP: (123-132)/(81-84) 123/84 mmHg (06/02 0500) SpO2:  [94 %-97 %] 96 % (06/02 0725) Weight change:  Last BM Date: 03/21/13  Intake/Output from previous day: 06/01 0701 - 06/02 0700 In: 4000 [P.O.:2000; I.V.:2000] Out: 1600 [Urine:1600]  PHYSICAL EXAM General appearance: alert, cooperative and no distress Resp: rhonchi bilaterally Cardio: regular rate and rhythm, S1, S2 normal, no murmur, click, rub or gallop GI: soft, non-tender; bowel sounds normal; no masses,  no organomegaly Extremities: extremities normal, atraumatic, no cyanosis or edema  Lab Results:    Basic Metabolic Panel:  Recent Labs  16/10/96 1140  NA 135  K 3.7  CL 94*  CO2 33*  GLUCOSE 306*  BUN 9  CREATININE 0.46*  CALCIUM 9.4   Liver Function Tests:  Recent Labs  04/21/13 1140  AST 13  ALT 10  ALKPHOS 68  BILITOT 0.2*  PROT 7.1  ALBUMIN 3.4*   No results found for this basename: LIPASE, AMYLASE,  in the last 72 hours No results found for this basename: AMMONIA,  in the last 72 hours CBC:  Recent Labs  04/21/13 1140  WBC 15.0*  NEUTROABS 13.1*  HGB 12.8  HCT 38.7  MCV 85.2  PLT 440*   Cardiac Enzymes: No results found for this basename: CKTOTAL, CKMB, CKMBINDEX, TROPONINI,  in the last 72 hours BNP: No results found for this basename: PROBNP,  in the last 72 hours D-Dimer: No results found for this basename: DDIMER,  in the last 72 hours CBG:  Recent Labs  04/22/13 2303 04/23/13 0747 04/23/13 1131 04/23/13 1629 04/23/13 2231 04/24/13 0743  GLUCAP 256* 277* 250* 320* 231* 336*   Hemoglobin A1C: No results found for this basename: HGBA1C,  in the last 72 hours Fasting Lipid  Panel: No results found for this basename: CHOL, HDL, LDLCALC, TRIG, CHOLHDL, LDLDIRECT,  in the last 72 hours Thyroid Function Tests: No results found for this basename: TSH, T4TOTAL, FREET4, T3FREE, THYROIDAB,  in the last 72 hours Anemia Panel: No results found for this basename: VITAMINB12, FOLATE, FERRITIN, TIBC, IRON, RETICCTPCT,  in the last 72 hours Coagulation: No results found for this basename: LABPROT, INR,  in the last 72 hours Urine Drug Screen: Drugs of Abuse     Component Value Date/Time   LABOPIA  Value: POSITIVE (NOTE) Sent for confirmatory testing Result repeated and verified.* 02/26/2011 2234   LABOPIA POSITIVE* 05/01/2010 1819   COCAINSCRNUR NEGATIVE 02/26/2011 2234   COCAINSCRNUR NONE DETECTED 05/01/2010 1819   LABBENZ  Value: POSITIVE QUANTITY NOT SUFFICIENT, UNABLE TO PERFORM TEST Unable to perform confirmation testing per [facility] (NOTE) Sent for confirmatory testing Result repeated and verified.* 02/26/2011 2234   LABBENZ POSITIVE* 05/01/2010 1819   AMPHETMU NEGATIVE 02/26/2011 2234   AMPHETMU NONE DETECTED 05/01/2010 1819   THCU POSITIVE* 05/01/2010 1819   LABBARB  Value: NONE DETECTED        DRUG SCREEN FOR MEDICAL PURPOSES ONLY.  IF CONFIRMATION IS NEEDED FOR ANY PURPOSE, NOTIFY LAB WITHIN 5 DAYS.        LOWEST DETECTABLE LIMITS FOR URINE DRUG SCREEN Drug Class       Cutoff (ng/mL) Amphetamine      1000  Barbiturate      200 Benzodiazepine   200 Tricyclics       300 Opiates          300 Cocaine          300 THC              50 05/01/2010 1819    Alcohol Level: No results found for this basename: ETH,  in the last 72 hours Urinalysis: No results found for this basename: COLORURINE, APPERANCEUR, LABSPEC, PHURINE, GLUCOSEU, HGBUR, BILIRUBINUR, KETONESUR, PROTEINUR, UROBILINOGEN, NITRITE, LEUKOCYTESUR,  in the last 72 hours Misc. Labs:  ABGS No results found for this basename: PHART, PCO2, PO2ART, TCO2, HCO3,  in the last 72 hours CULTURES Recent Results (from the  past 240 hour(s))  CULTURE, BLOOD (ROUTINE X 2)     Status: None   Collection Time    04/21/13 11:47 AM      Result Value Range Status   Specimen Description Blood RIGHT ARM   Final   Special Requests BOTTLES DRAWN AEROBIC AND ANAEROBIC 8 CC EACH   Final   Culture NO GROWTH 2 DAYS   Final   Report Status PENDING   Incomplete  CULTURE, BLOOD (ROUTINE X 2)     Status: None   Collection Time    04/21/13 11:52 AM      Result Value Range Status   Specimen Description Blood LEFT ARM   Final   Special Requests BOTTLES DRAWN AEROBIC AND ANAEROBIC 8 CC EACH   Final   Culture NO GROWTH 2 DAYS   Final   Report Status PENDING   Incomplete  MRSA PCR SCREENING     Status: None   Collection Time    04/21/13 12:07 PM      Result Value Range Status   MRSA by PCR NEGATIVE  NEGATIVE Final   Comment:            The GeneXpert MRSA Assay (FDA     approved for NASAL specimens     only), is one component of a     comprehensive MRSA colonization     surveillance program. It is not     intended to diagnose MRSA     infection nor to guide or     monitor treatment for     MRSA infections.  CULTURE, EXPECTORATED SPUTUM-ASSESSMENT     Status: None   Collection Time    04/22/13  8:44 AM      Result Value Range Status   Specimen Description SPUTUM   Final   Special Requests Normal   Final   Sputum evaluation     Final   Value: THIS SPECIMEN IS ACCEPTABLE. RESPIRATORY CULTURE REPORT TO FOLLOW.   Report Status 04/22/2013 FINAL   Final  CULTURE, RESPIRATORY (NON-EXPECTORATED)     Status: None   Collection Time    04/22/13  8:44 AM      Result Value Range Status   Specimen Description SPUTUM   Final   Special Requests NONE   Final   Gram Stain PENDING   Incomplete   Culture NORMAL OROPHARYNGEAL FLORA   Final   Report Status PENDING   Incomplete   Studies/Results: No results found.  Medications:  Prior to Admission:  Prescriptions prior to admission  Medication Sig Dispense Refill  . albuterol  (PROAIR HFA) 108 (90 BASE) MCG/ACT inhaler Inhale 2 puffs into the lungs every 6 (six) hours as needed. For shortness of breATH      .  albuterol (PROVENTIL) (2.5 MG/3ML) 0.083% nebulizer solution Take 2.5 mg by nebulization every 4 (four) hours as needed. For shortness of breath       . budesonide-formoterol (SYMBICORT) 160-4.5 MCG/ACT inhaler Inhale 2 puffs into the lungs 2 (two) times daily.      . Choline Fenofibrate (TRILIPIX) 135 MG capsule Take 135 mg by mouth every morning.       . Diphenhyd-Hydrocort-Nystatin (FIRST-DUKES MOUTHWASH MT) Use as directed 5 mLs in the mouth or throat daily as needed. FOR THRUSH      . docusate sodium (COLACE) 100 MG capsule Take 200 mg by mouth every morning.      . fluticasone (FLONASE) 50 MCG/ACT nasal spray Place 2 sprays into the nose daily.      [name] lisinopril (PRINIVIL,ZESTRIL) 20 MG tablet Take 20 mg by mouth daily.      [name] oxyCODONE (ROXICODONE) 15 MG immediate release tablet Take 15 mg by mouth every 4 (four) hours as needed for pain.      . pantoprazole (PROTONIX) 40 MG tablet Take 1 tablet (40 mg total) by mouth 2 (two) times daily.  60 tablet  0  . predniSONE (DELTASONE) 10 MG tablet Take 10 mg by mouth daily. Based on taper schedule. Has 2 more days left in pack. Currently 10 mg once daily      . pregabalin (LYRICA) 50 MG capsule Take 50 mg by mouth at bedtime.      . promethazine (PHENERGAN) 25 MG tablet Take 25 mg by mouth every 6 (six) hours as needed. For nausea      . Simethicone (GAS-X EXTRA STRENGTH) 125 MG CAPS Take 2 capsules by mouth every morning.      . SUMAtriptan (IMITREX) 25 MG tablet Take 25 mg by mouth every 2 (two) hours as needed.      . tiotropium (SPIRIVA) 18 MCG inhalation capsule Place 18 mcg into inhaler and inhale every morning.       Scheduled: . albuterol  2.5 mg Nebulization Q4H  . budesonide-formoterol  2 puff Inhalation BID  . cefTRIAXone (ROCEPHIN)  IV  1 g Intravenous Q24H  . enoxaparin (LOVENOX) injection  40 mg  Subcutaneous Q24H  . fenofibrate  160 mg Oral Daily  . fluconazole  100 mg Oral Daily  . fluticasone  2 spray Each Nare Daily  . guaiFENesin  1,200 mg Oral BID  . insulin aspart  0-20 Units Subcutaneous TID WC  . insulin aspart  0-5 Units Subcutaneous QHS  . ipratropium  0.5 mg Nebulization Q4H  . lisinopril  20 mg Oral Daily  . methylPREDNISolone (SOLU-MEDROL) injection  125 mg Intravenous Q6H  . OxyCODONE  10 mg Oral Q6H  . pantoprazole  40 mg Oral BID  . pregabalin  50 mg Oral QHS  . senna-docusate  2 tablet Oral BID  . simethicone  240 mg Oral Daily  . sodium chloride  3 mL Intravenous Q12H   Continuous: . sodium chloride 75 mL/hr at 04/23/13 1900   WUJ:[id number], acetaminophen, alum & mag hydroxide-simeth, chlorpheniramine-HYDROcodone, LORazepam, ondansetron (ZOFRAN) IV, ondansetron, oxyCODONE, oxyCODONE-acetaminophen, promethazine, SUMAtriptan  Assesment: She was admitted with COPD exacerbation. She has multiple other medical problems but is doing better in general. She says her current pain management is working better. Principal Problem:   COPD exacerbation Active Problems:   Low back pain   Thrush, oral   DM (diabetes mellitus)   GERD (gastroesophageal reflux disease)    Plan: Discharge home    LOS: 3  days   [name] 04/24/2013, 8:51 AM

## 2013-04-25 LAB — CULTURE, RESPIRATORY W GRAM STAIN
Culture: NORMAL
Gram Stain: NONE SEEN

## 2013-04-26 LAB — CULTURE, BLOOD (ROUTINE X 2): Culture: NO GROWTH

## 2013-04-27 NOTE — Progress Notes (Signed)
UR chart review completed.  

## 2013-05-25 ENCOUNTER — Ambulatory Visit: Payer: Medicaid Other | Admitting: Internal Medicine

## 2013-06-21 ENCOUNTER — Encounter: Payer: Self-pay | Admitting: *Deleted

## 2013-06-22 ENCOUNTER — Encounter: Payer: Self-pay | Admitting: Internal Medicine

## 2013-06-22 ENCOUNTER — Ambulatory Visit (INDEPENDENT_AMBULATORY_CARE_PROVIDER_SITE_OTHER): Payer: Medicaid Other | Admitting: Internal Medicine

## 2013-06-22 VITALS — BP 128/88 | HR 85 | Ht 61.0 in | Wt 157.0 lb

## 2013-06-22 DIAGNOSIS — R0602 Shortness of breath: Secondary | ICD-10-CM

## 2013-06-22 DIAGNOSIS — R079 Chest pain, unspecified: Secondary | ICD-10-CM

## 2013-06-22 NOTE — Progress Notes (Signed)
HPI Patient is a 47 yo with a history of COPD, DM  Followed by Dr. Juanetta Gosling  Referred for evaluation of CP Patient has been struggling with muscle spasms in chest Has had a few pains shoot to L arm/hand.  Get worse with emotional stress, sometimes with activity Going on fro about 2 months  Not getting better  Weak in L arm. Can't sleep on L side  Same kind of discomfort but worse Does also not some dull sensations of discomfort in chest   Denies cough except when sick.  Allergies  Allergen Reactions  . Penicillins Other (See Comments)    convulsions  . Levaquin (Levofloxacin In D5w) Itching  . Morphine Nausea Only    Current Outpatient Prescriptions  Medication Sig Dispense Refill  . acetaminophen (TYLENOL) 500 MG tablet Take 500 mg by mouth every 6 (six) hours as needed for pain.      [name] albuterol (PROAIR HFA) 108 (90 BASE) MCG/ACT inhaler Inhale 2 puffs into the lungs every 6 (six) hours as needed. For shortness of breATH      . albuterol (PROVENTIL) (2.5 MG/3ML) 0.083% nebulizer solution Take 2.5 mg by nebulization every 4 (four) hours as needed. For shortness of breath       . aspirin-acetaminophen-caffeine (EXCEDRIN MIGRAINE) 250-250-65 MG per tablet Take 1 tablet by mouth every 6 (six) hours as needed for pain.      . budesonide-formoterol (SYMBICORT) 160-4.5 MCG/ACT inhaler Inhale 2 puffs into the lungs 2 (two) times daily.      . Choline Fenofibrate (TRILIPIX) 135 MG capsule Take 135 mg by mouth every morning.       . Diphenhyd-Hydrocort-Nystatin (FIRST-DUKES MOUTHWASH MT) Use as directed 5 mLs in the mouth or throat daily as needed. FOR THRUSH      . docusate sodium (COLACE) 100 MG capsule Take 200 mg by mouth every morning.      . fluticasone (FLONASE) 50 MCG/ACT nasal spray Place 2 sprays into the nose daily.      [name] lisinopril (PRINIVIL,ZESTRIL) 20 MG tablet Take 20 mg by mouth daily.      [name] LORazepam (ATIVAN) 0.5 MG tablet Take 1 tablet (0.5 mg total) by mouth every 6 (six)  hours as needed for anxiety.  60 tablet  1  . methocarbamol (ROBAXIN) 500 MG tablet Take 500 mg by mouth 2 (two) times daily.      . OxyCODONE (OXYCONTIN) 10 mg T12A Take 1 tablet (10 mg total) by mouth every 12 (twelve) hours.  60 tablet  0  . oxyCODONE (ROXICODONE) 15 MG immediate release tablet Take 15 mg by mouth every 4 (four) hours as needed for pain.      [name] Oxymetazoline HCl (AFRIN NASAL SPRAY NA) Place into the nose.      . pantoprazole (PROTONIX) 40 MG tablet Take 40 mg by mouth daily.      . pregabalin (LYRICA) 50 MG capsule Take 50 mg by mouth at bedtime.      . promethazine (PHENERGAN) 25 MG tablet Take 25 mg by mouth every 6 (six) hours as needed. For nausea      . Simethicone (GAS-X EXTRA STRENGTH) 125 MG CAPS Take 2 capsules by mouth every morning.      . SUMAtriptan (IMITREX) 25 MG tablet Take 25 mg by mouth every 2 (two) hours as needed.      . tiotropium (SPIRIVA) 18 MCG inhalation capsule Place 18 mcg into inhaler and inhale every morning.      . [  DISCONTINUED] ARIPiprazole (ABILIFY) 5 MG tablet Take 5 mg by mouth daily.        . [DISCONTINUED] venlafaxine (EFFEXOR) 75 MG tablet Take 75 mg by mouth daily.         No current facility-administered medications for this visit.    Past Medical History  Diagnosis Date  . Migraine headache   . On home O2   . COPD (chronic obstructive pulmonary disease)   . Chronic back pain   . Hyperglycemia, drug-induced     steroid induced hyperglycemia  . ARDS (adult respiratory distress syndrome)     Jan 2011  . Diabetes mellitus   . Pneumonia   . Angina   . Asthma   . Shortness of breath   . Recurrent upper respiratory infection (URI)   . GERD (gastroesophageal reflux disease) 12/21/2012    Past Surgical History  Procedure Laterality Date  . Tubal ligation    . Uterine ablasion    . C-section    . Tracheostomy      decannulated 12/2009    Family History  Problem Relation Age of Onset  . Coronary artery disease Brother    . Diabetes Other   . Cancer Other   . Hypertension Other     History   Social History  . Marital Status: Legally Separated    Spouse Name: N/A    Number of Children: N/A  . Years of Education: N/A   Occupational History  . Not on file.   Social History Main Topics  . Smoking status: Current Every Day Smoker -- 0.25 packs/day for 33 years    Types: Cigarettes  . Smokeless tobacco: Not on file     Comment: 1/2 pack a day  since age 89  . Alcohol Use: No  . Drug Use: No     Comment: Formerly used cocaine , narcotics, THC  . Sexually Active: Yes    Birth Control/ Protection: Other-see comments   Other Topics Concern  . Not on file   Social History Narrative   Living with her mom.     Review of Systems:  All systems reviewed.  They are negative to the above problem except as previously stated.  Vital Signs: BP 128/88  Pulse 85  Ht 5\' 1"  (1.549 m)  Wt 157 lb (71.215 kg)  BMI 29.68 kg/m2  Physical Exam Patienti n NAD  On O2. HEENT:  Normocephalic, atraumatic. EOMI, PERRLA.  Neck: JVP is normal.  No bruits.  Lungs: Some decreased airflow  Wheezes bilaterally  Occasional pops. Chest:  Tender to palpation of L chest  Brings on pains she has been having Heart: Regular rate and rhythm. Normal S1, S2. No S3.   No significant murmurs. PMI not displaced.  Abdomen:  Supple, nontender. Normal bowel sounds. No masses. No hepatomegaly.  Extremities:   Good distal pulses throughout. No lower extremity edema.  Musculoskeletal :moving all extremities.  Neuro:   alert and oriented x3.  CN II-XII grossly intact.  EKG  SR 85  IRBBB.  Assessment and Plan:  1.  CP  Patients symptoms appear to be mainly musculoskeletal. I am not sure what she is doing to stress these muscles  She does have some dull sensations in chest  That is only concerning thing Does have risk factors for CAD  WIll schedule echo   Hold on further testing for now.  2.  COPD  Severe.  3.  Tob  COntinues to  smoke  I have counselled  on cessation.  3.

## 2013-06-22 NOTE — Patient Instructions (Signed)
No medication changes were made today  Your physician has requested that you have an echocardiogram. Echocardiography is a painless test that uses sound waves to create images of your heart. It provides your doctor with information about the size and shape of your heart and how well your heart's chambers and valves are working. This procedure takes approximately one hour. There are no restrictions for this procedure.   

## 2013-06-27 ENCOUNTER — Ambulatory Visit (HOSPITAL_COMMUNITY): Payer: Medicaid Other

## 2013-06-30 ENCOUNTER — Inpatient Hospital Stay (HOSPITAL_COMMUNITY): Admission: RE | Admit: 2013-06-30 | Payer: Medicaid Other | Source: Ambulatory Visit

## 2013-07-20 ENCOUNTER — Ambulatory Visit (HOSPITAL_COMMUNITY): Admission: RE | Admit: 2013-07-20 | Payer: Medicaid Other | Source: Ambulatory Visit

## 2013-07-26 ENCOUNTER — Other Ambulatory Visit (HOSPITAL_COMMUNITY): Payer: Medicaid Other

## 2013-07-28 ENCOUNTER — Ambulatory Visit (HOSPITAL_COMMUNITY): Payer: Medicaid Other

## 2013-08-04 ENCOUNTER — Ambulatory Visit (HOSPITAL_COMMUNITY): Payer: Medicaid Other | Attending: Internal Medicine

## 2013-08-22 IMAGING — CR DG CHEST 2V
2 series · 2 of 2 positions shown · non-contrast
Comparison: 02/05/2012

CLINICAL DATA: Chest pain, shortness of breath.

CHEST - 2 VIEW

[view not recorded (1 of 2)]
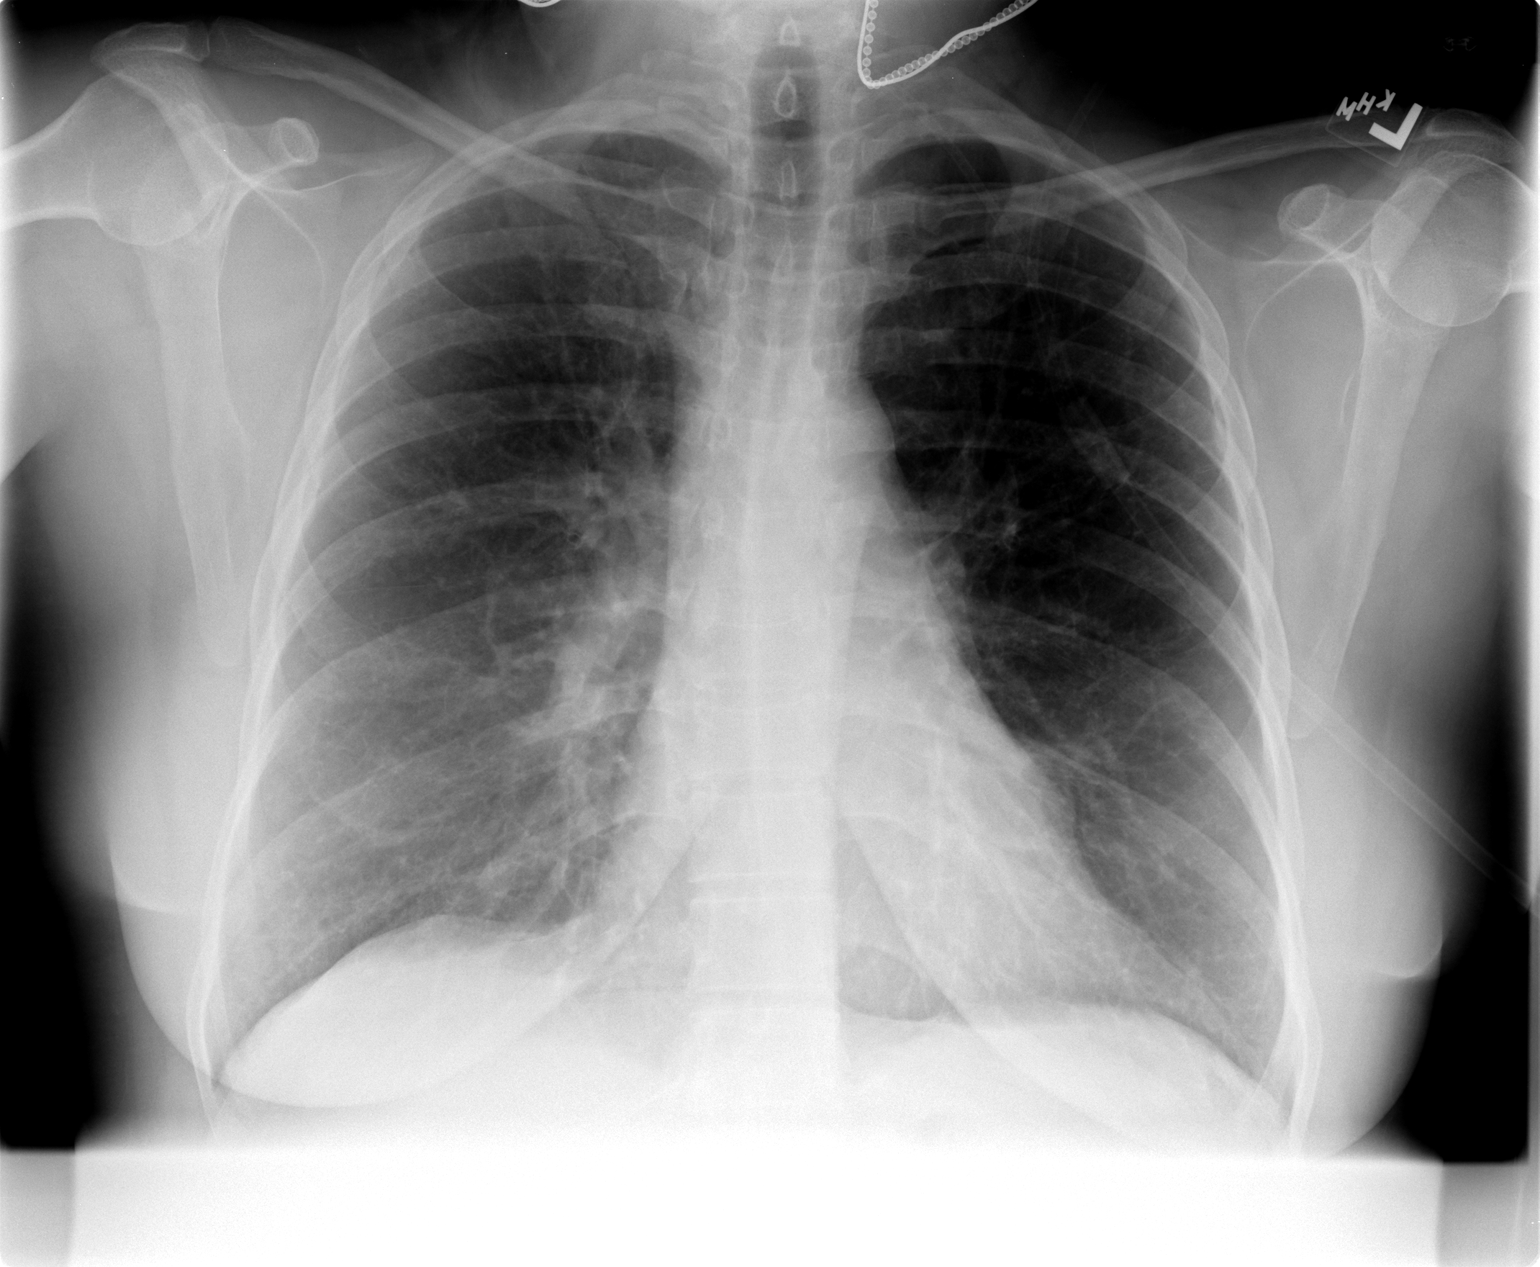

[view not recorded (2 of 2)]
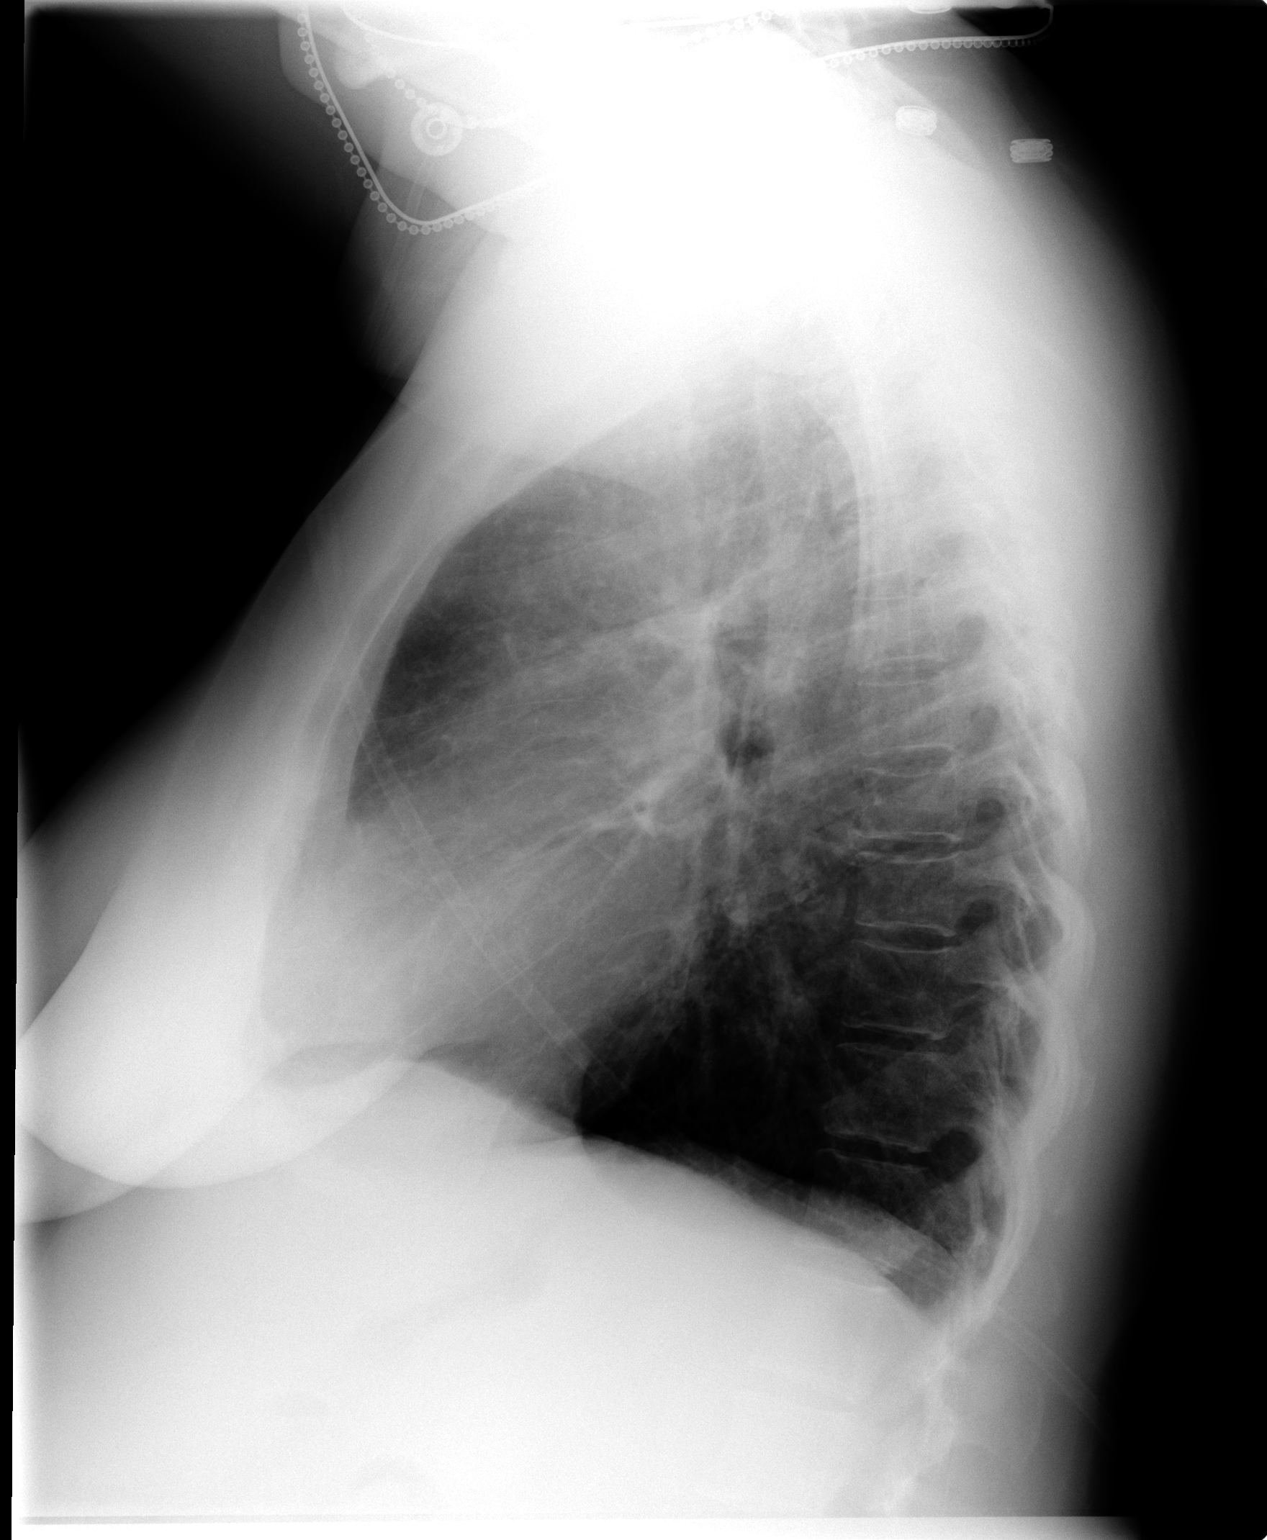

[2 of 2 positions shown; findings below may reference images not displayed]

FINDINGS: Hazy appearance to the right lung bases is favored to be
accentuated by overlying soft tissues rather than lung parenchyma
process. However, there is interstitial prominence.  Heart size
upper normal.  Mild central peribronchial thickening.  No acute
osseous finding.
IMPRESSION: Interstitial prominence and peribronchial thickening may reflect
acute and/or chronic bronchitic changes.

## 2013-10-26 IMAGING — CR DG CHEST 2V
2 series · 2 of 2 positions shown · non-contrast
Comparison: Portable chest x-ray of 07/17/2012

CLINICAL DATA: Shortness of breath, smoking history, diabetes

CHEST - 2 VIEW

[view not recorded (1 of 2)]
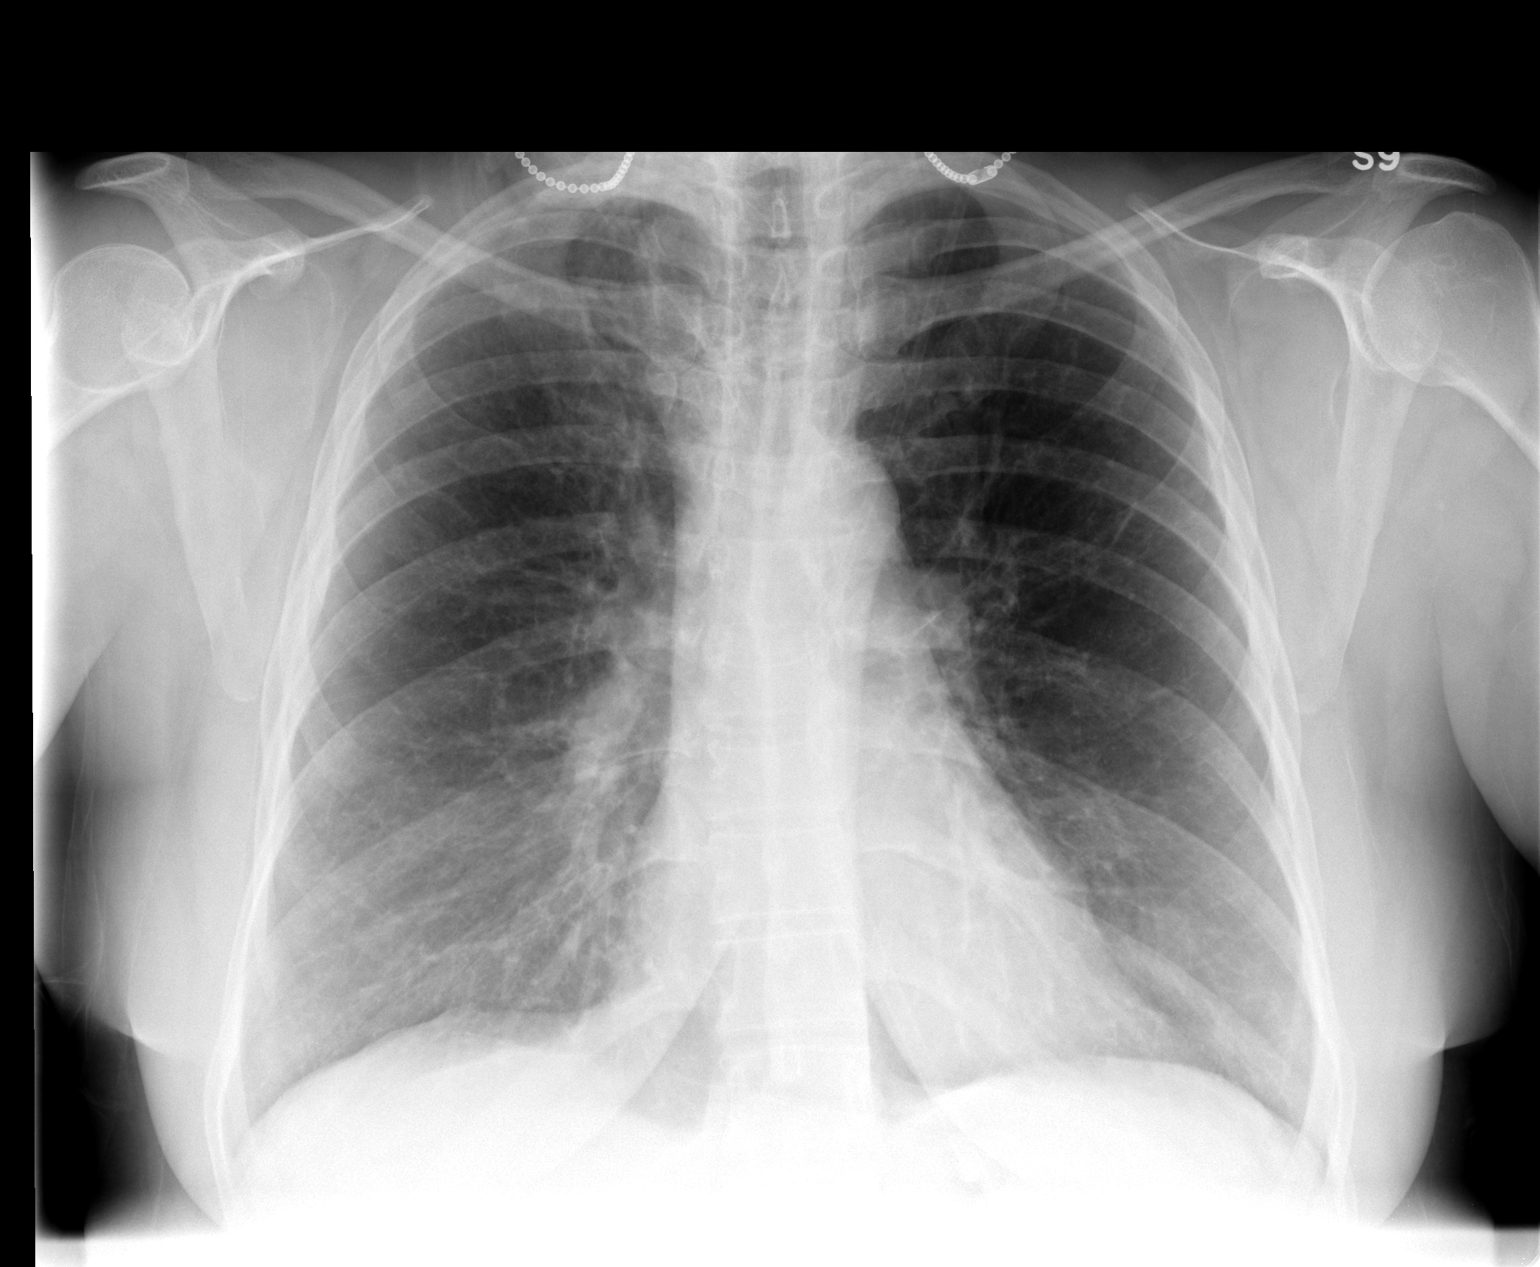

[view not recorded (2 of 2)]
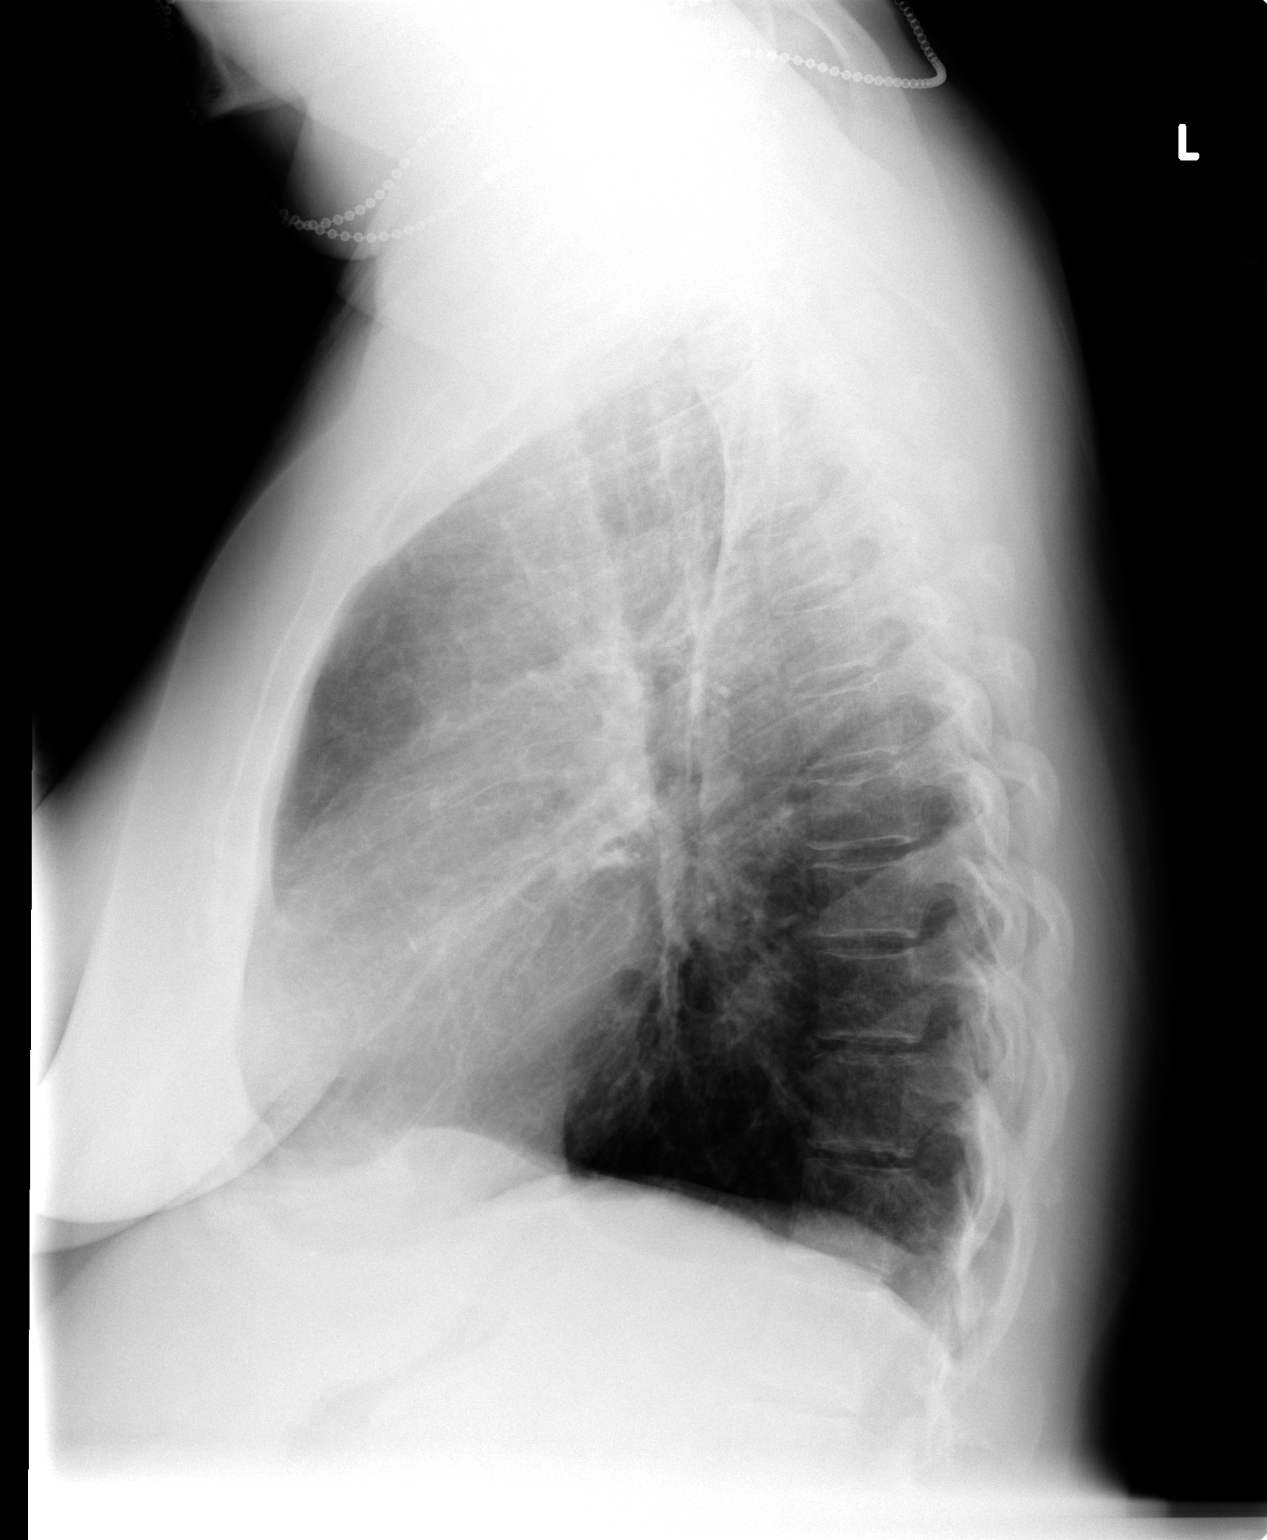

[2 of 2 positions shown; findings below may reference images not displayed]

FINDINGS: The lungs remain hyperaerated consistent with COPD.  Some
peribronchial thickening is noted indicative of bronchitis.  No
pneumonia is seen.  The heart is within normal limits in size.  No
bony abnormality is seen.
IMPRESSION: COPD and probable chronic bronchitis.  No active lung disease.

## 2013-12-24 IMAGING — CR DG CHEST 2V
2 series · 2 of 2 positions shown · non-contrast
Comparison: 09/20/2012

CLINICAL DATA: nasal congestion and diarrhea

CHEST - 2 VIEW

[view not recorded (1 of 2)]
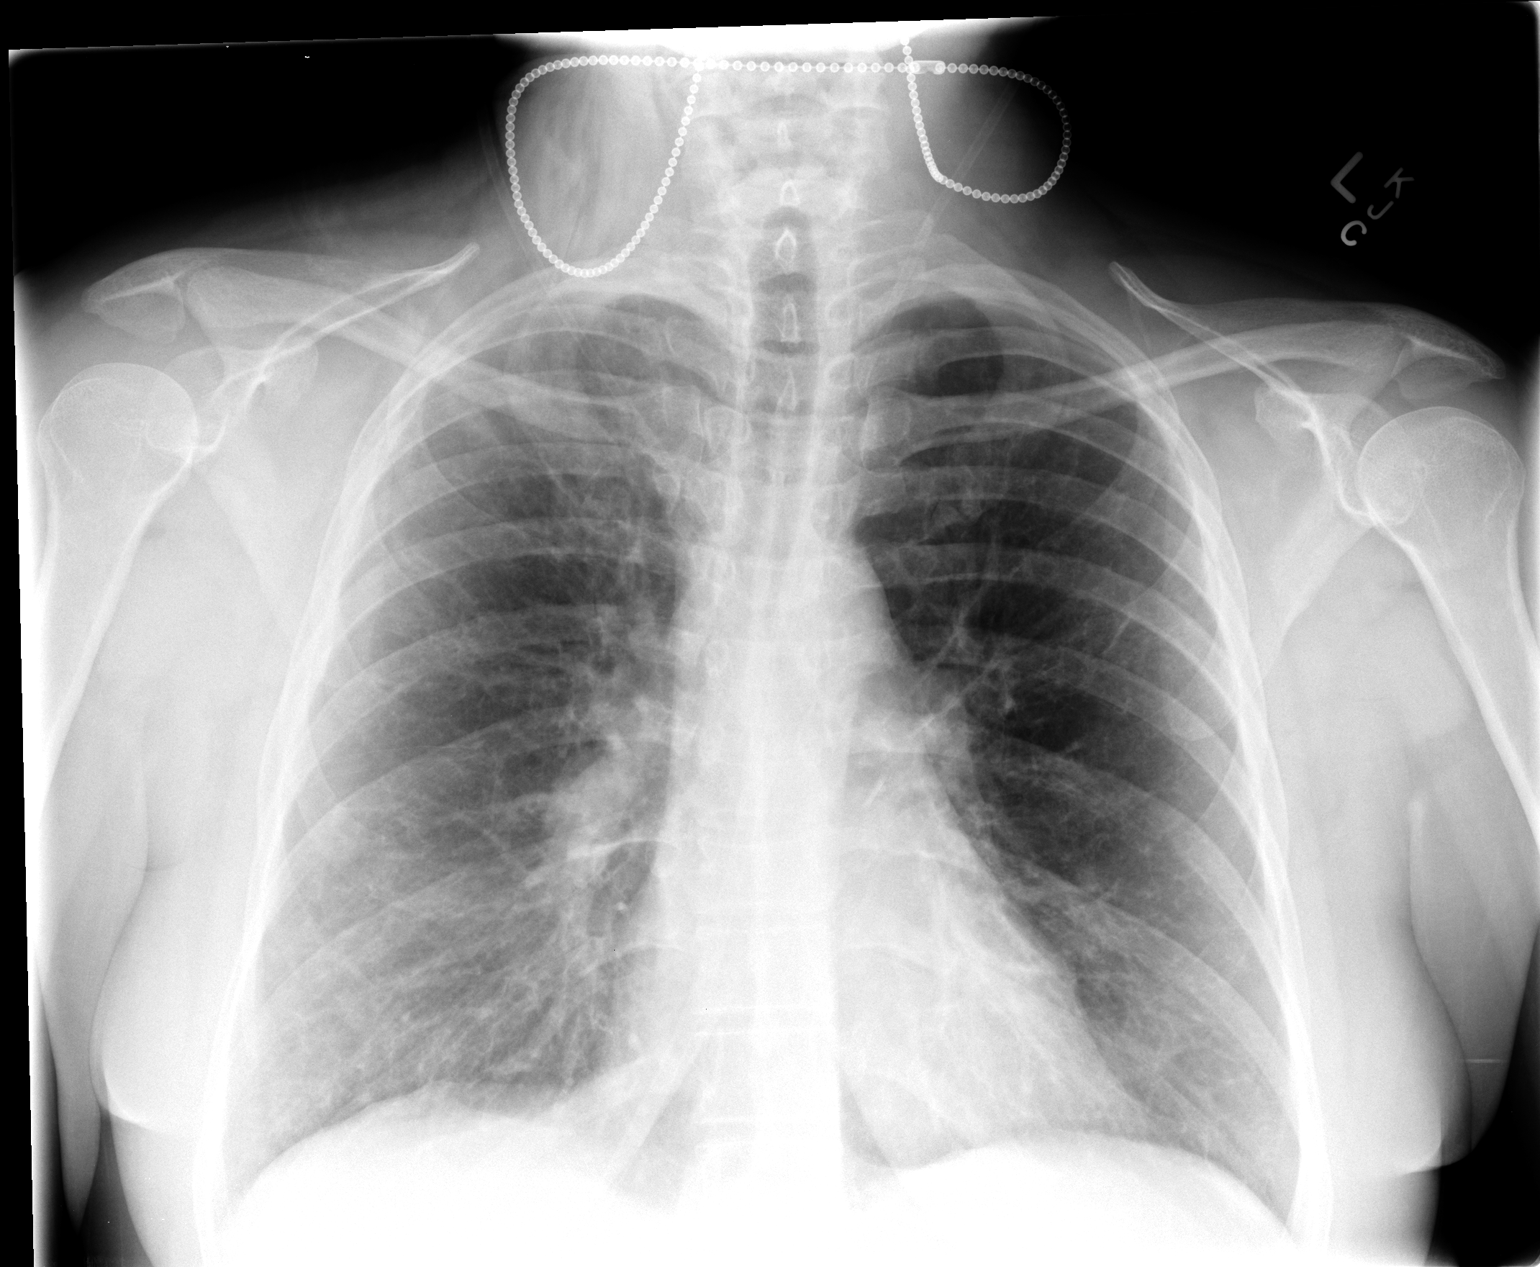

[view not recorded (2 of 2)]
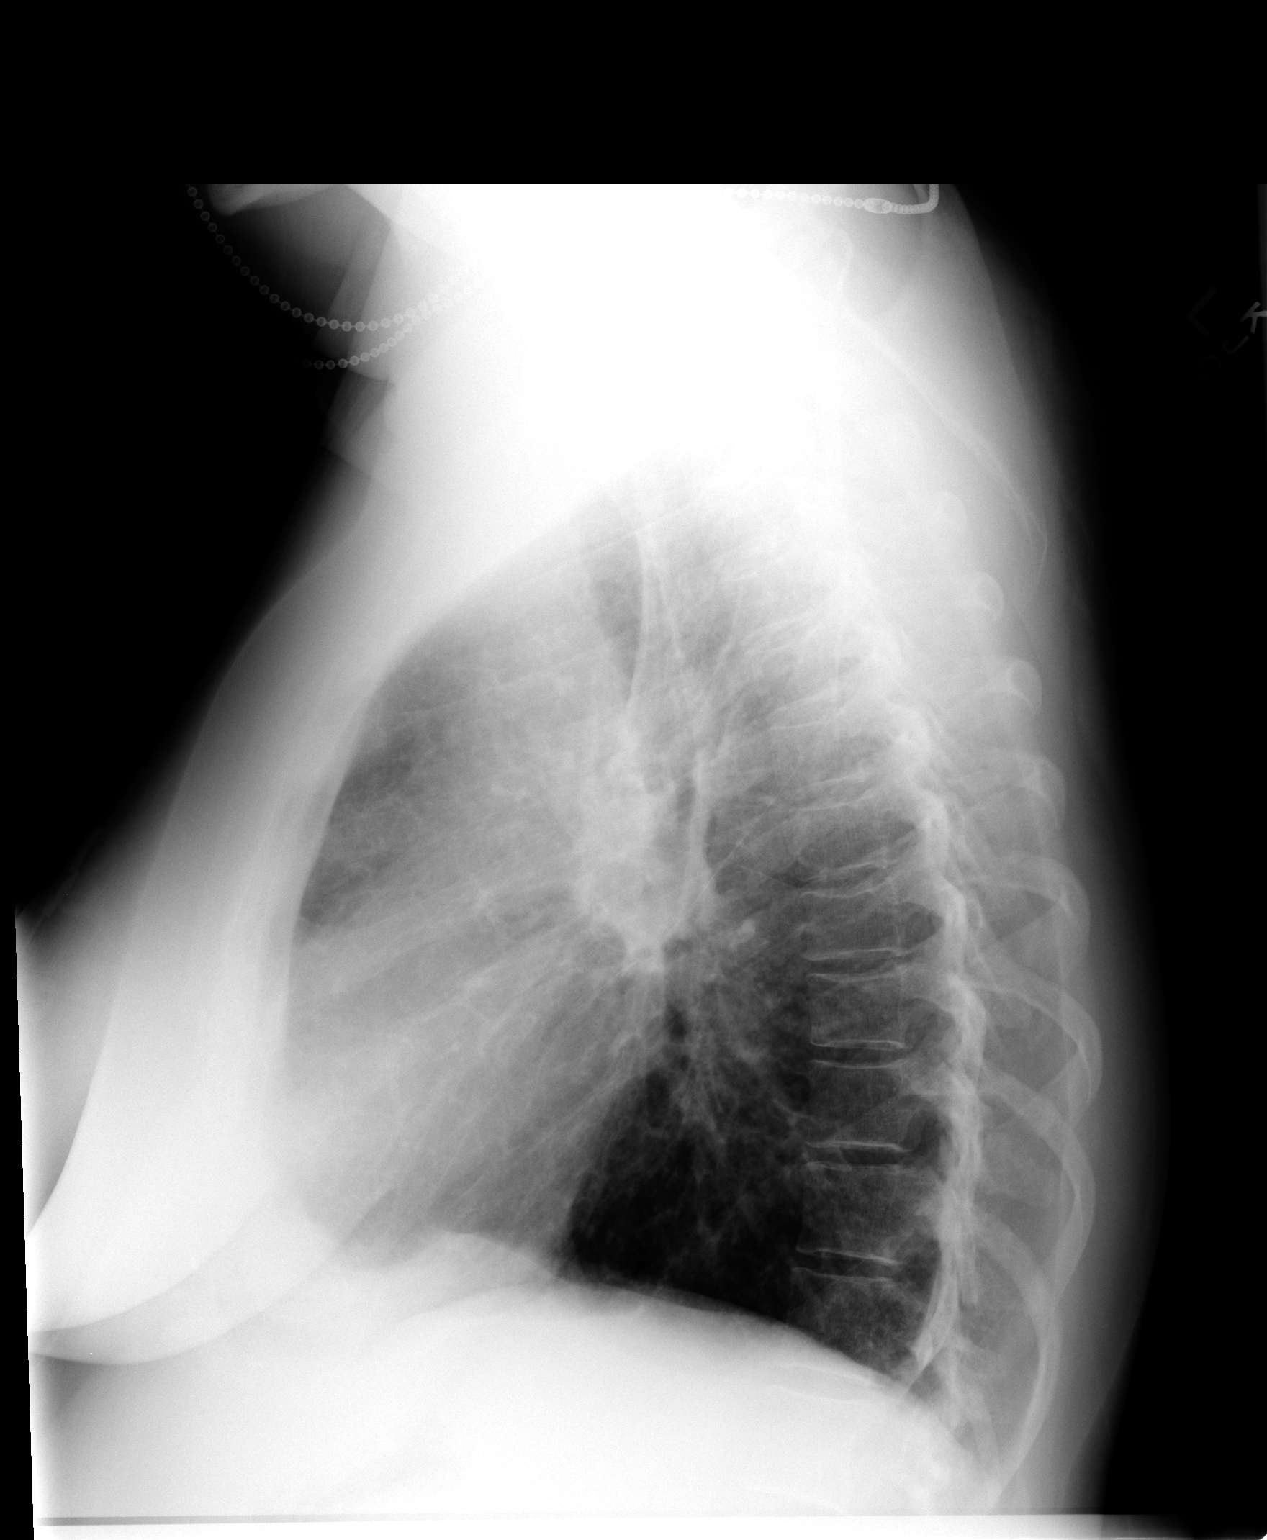

[2 of 2 positions shown; findings below may reference images not displayed]

FINDINGS: Normal heart size.  No pleural effusion or edema.
Chronic interstitial coarsening is again noted and appears similar
to previous exam.  Review of the visualized osseous structures is
unremarkable.
IMPRESSION: 1.  No acute cardiopulmonary abnormalities.
2.  Chronic interstitial coarsening.

## 2014-01-05 IMAGING — CR DG CHEST 2V
2 series · 2 of 2 positions shown · non-contrast
Comparison: 11/18/2012

CLINICAL DATA: Fever, chest pain

CHEST - 2 VIEW

[w chest pa]
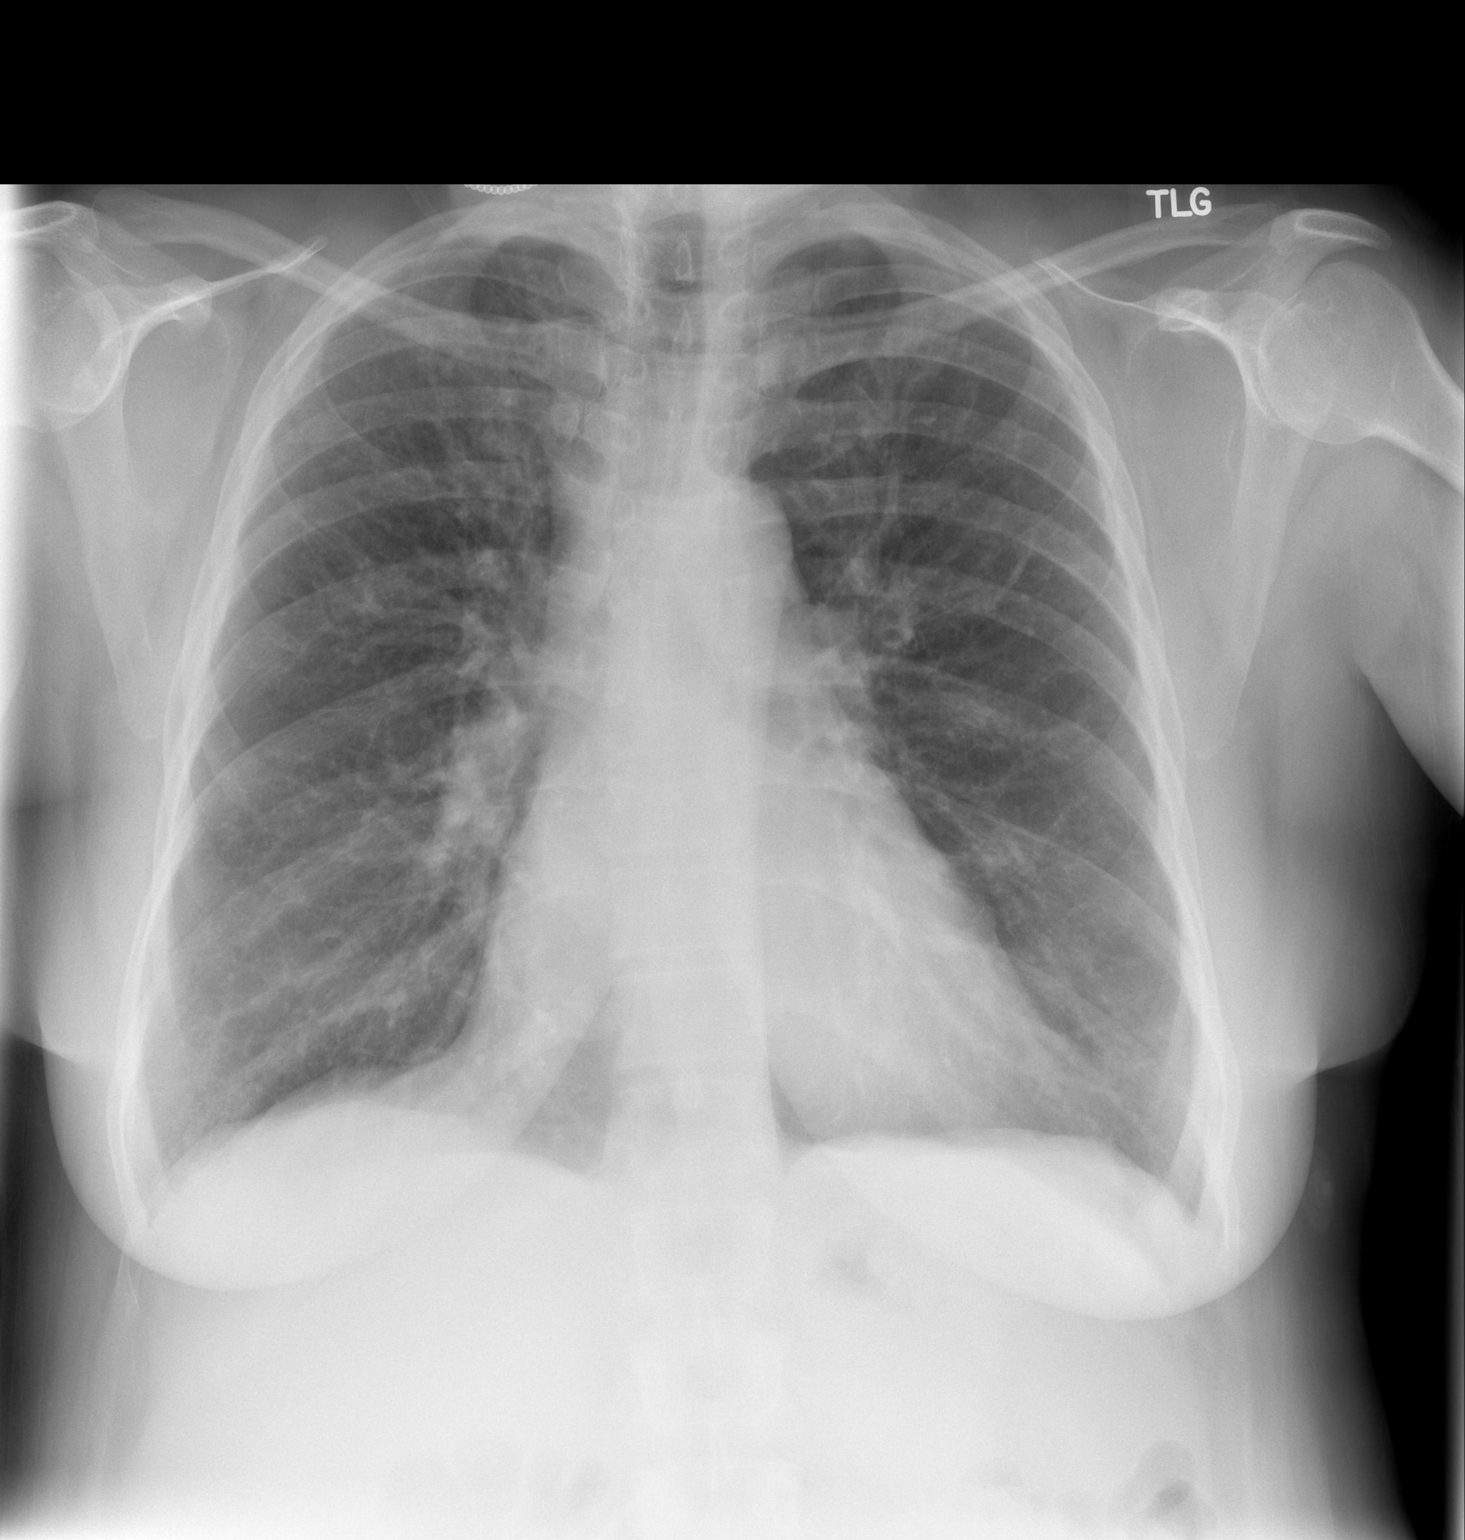

[w chest lat]
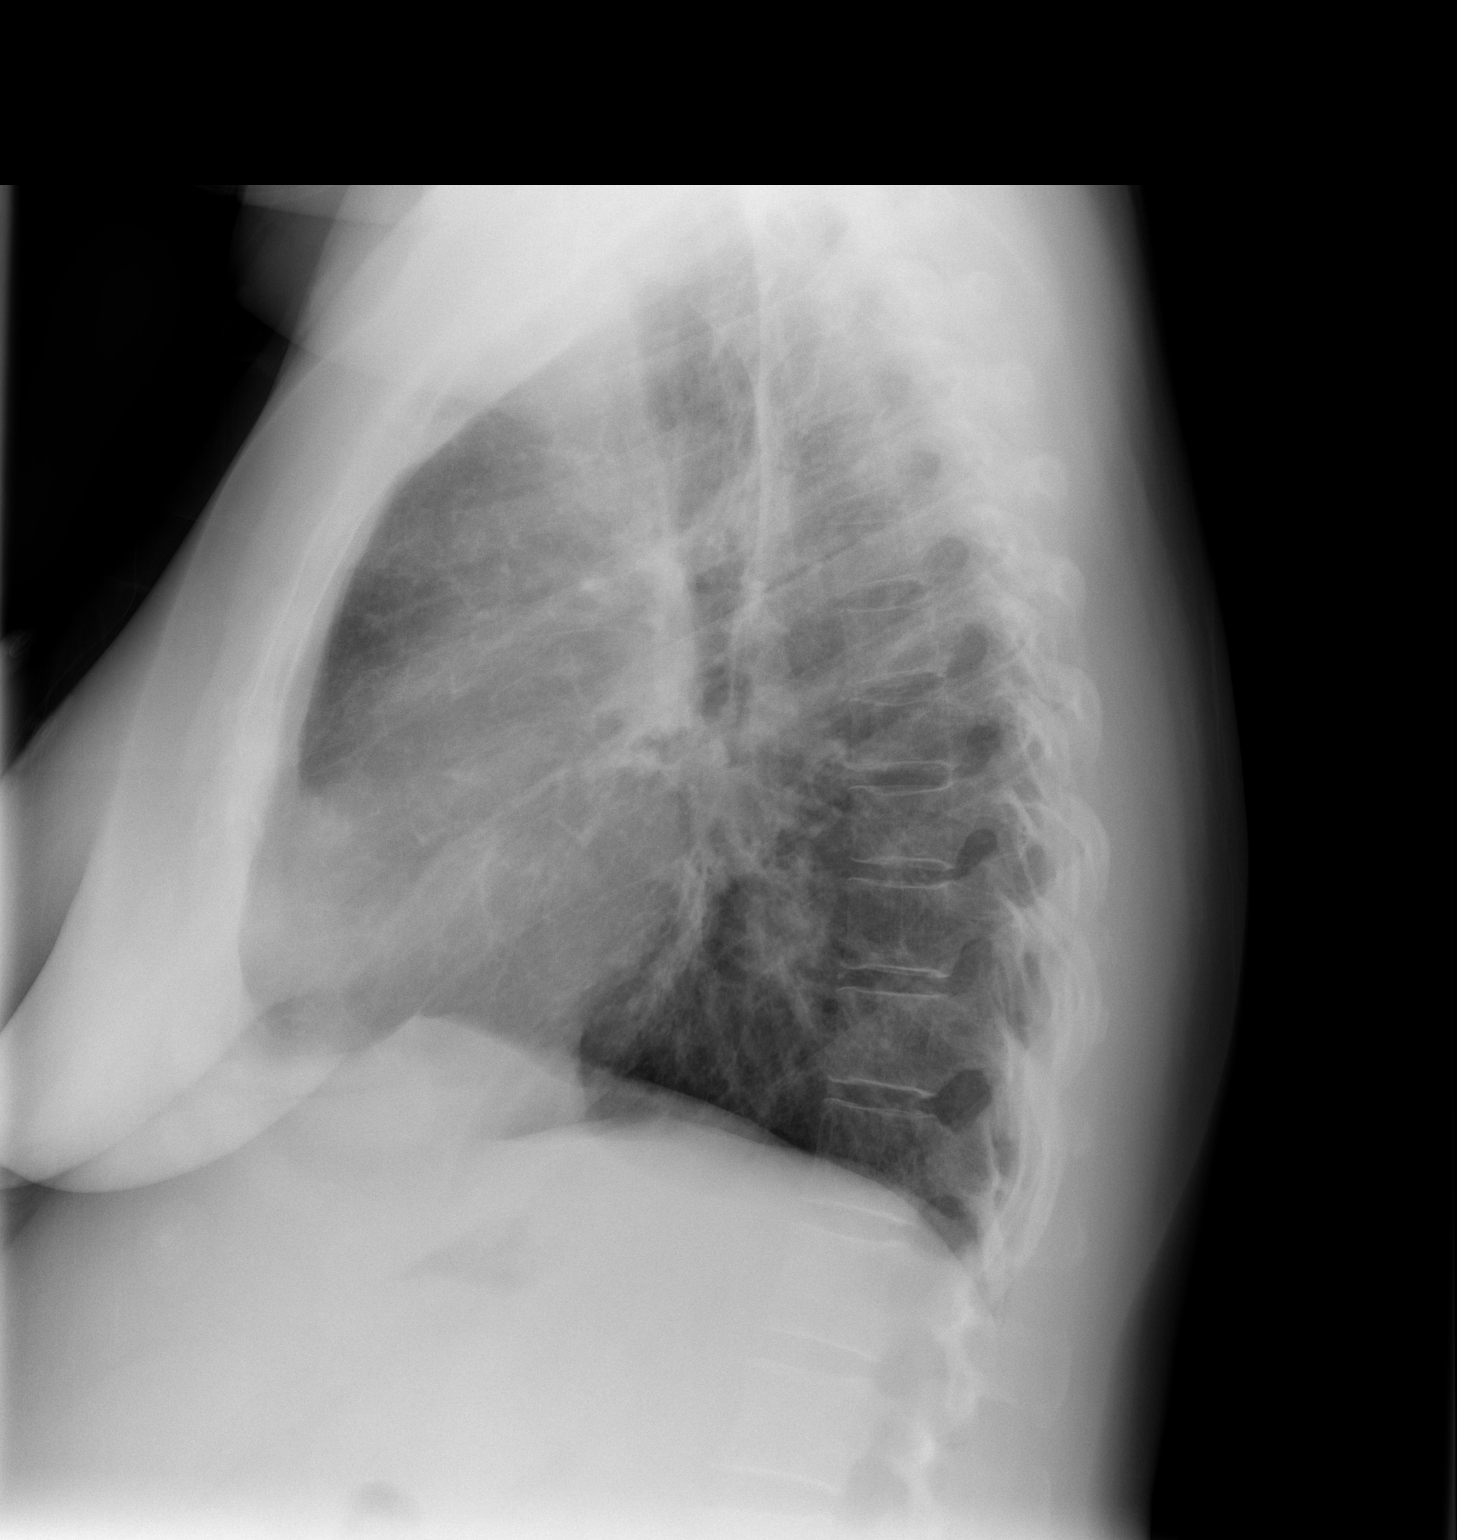

[2 of 2 positions shown; findings below may reference images not displayed]

FINDINGS: Cardiomediastinal silhouette is stable.  No acute
infiltrate or pulmonary edema.  Stable chronic mild interstitial
prominence.  Central mild bronchitic changes.
IMPRESSION: No acute infiltrate or pulmonary edema.  Central mild bronchitic
changes.  Stable chronic mild interstitial prominence.

## 2014-01-13 ENCOUNTER — Other Ambulatory Visit (INDEPENDENT_AMBULATORY_CARE_PROVIDER_SITE_OTHER): Payer: Self-pay | Admitting: Internal Medicine

## 2014-01-13 DIAGNOSIS — K219 Gastro-esophageal reflux disease without esophagitis: Secondary | ICD-10-CM

## 2014-03-11 ENCOUNTER — Inpatient Hospital Stay (HOSPITAL_COMMUNITY)
Admission: EM | Admit: 2014-03-11 | Discharge: 2014-03-14 | DRG: 193 | Disposition: A | Discharge status: Home / Self Care | Payer: Medicaid Other | Attending: Pulmonary Disease | Admitting: Pulmonary Disease

## 2014-03-11 ENCOUNTER — Emergency Department (HOSPITAL_COMMUNITY): Payer: Medicaid Other

## 2014-03-11 ENCOUNTER — Encounter (HOSPITAL_COMMUNITY): Payer: Self-pay | Admitting: Emergency Medicine

## 2014-03-11 DIAGNOSIS — F17210 Nicotine dependence, cigarettes, uncomplicated: Secondary | ICD-10-CM | POA: Diagnosis present

## 2014-03-11 DIAGNOSIS — J9620 Acute and chronic respiratory failure, unspecified whether with hypoxia or hypercapnia: Secondary | ICD-10-CM | POA: Diagnosis present

## 2014-03-11 DIAGNOSIS — Z8249 Family history of ischemic heart disease and other diseases of the circulatory system: Secondary | ICD-10-CM

## 2014-03-11 DIAGNOSIS — F1910 Other psychoactive substance abuse, uncomplicated: Secondary | ICD-10-CM | POA: Diagnosis present

## 2014-03-11 DIAGNOSIS — Z9981 Dependence on supplemental oxygen: Secondary | ICD-10-CM

## 2014-03-11 DIAGNOSIS — J45901 Unspecified asthma with (acute) exacerbation: Secondary | ICD-10-CM

## 2014-03-11 DIAGNOSIS — K219 Gastro-esophageal reflux disease without esophagitis: Secondary | ICD-10-CM | POA: Diagnosis present

## 2014-03-11 DIAGNOSIS — G8929 Other chronic pain: Secondary | ICD-10-CM | POA: Diagnosis present

## 2014-03-11 DIAGNOSIS — M549 Dorsalgia, unspecified: Secondary | ICD-10-CM | POA: Diagnosis present

## 2014-03-11 DIAGNOSIS — Z833 Family history of diabetes mellitus: Secondary | ICD-10-CM

## 2014-03-11 DIAGNOSIS — Z79899 Other long term (current) drug therapy: Secondary | ICD-10-CM

## 2014-03-11 DIAGNOSIS — F17200 Nicotine dependence, unspecified, uncomplicated: Secondary | ICD-10-CM | POA: Diagnosis present

## 2014-03-11 DIAGNOSIS — J441 Chronic obstructive pulmonary disease with (acute) exacerbation: Secondary | ICD-10-CM | POA: Diagnosis present

## 2014-03-11 DIAGNOSIS — J189 Pneumonia, unspecified organism: Principal | ICD-10-CM | POA: Diagnosis present

## 2014-03-11 DIAGNOSIS — J449 Chronic obstructive pulmonary disease, unspecified: Secondary | ICD-10-CM

## 2014-03-11 DIAGNOSIS — E119 Type 2 diabetes mellitus without complications: Secondary | ICD-10-CM | POA: Diagnosis present

## 2014-03-11 LAB — COMPREHENSIVE METABOLIC PANEL
ALT: 9 U/L (ref 0–35)
AST: 15 U/L (ref 0–37)
Albumin: 3.3 g/dL — ABNORMAL LOW (ref 3.5–5.2)
Alkaline Phosphatase: 75 U/L (ref 39–117)
BUN: 10 mg/dL (ref 6–23)
CALCIUM: 9.2 mg/dL (ref 8.4–10.5)
CO2: 30 meq/L (ref 19–32)
CREATININE: 0.35 mg/dL — AB (ref 0.50–1.10)
Chloride: 95 mEq/L — ABNORMAL LOW (ref 96–112)
GLUCOSE: 216 mg/dL — AB (ref 70–99)
Potassium: 3.7 mEq/L (ref 3.7–5.3)
SODIUM: 137 meq/L (ref 137–147)
TOTAL PROTEIN: 7.5 g/dL (ref 6.0–8.3)
Total Bilirubin: 0.6 mg/dL (ref 0.3–1.2)

## 2014-03-11 LAB — INFLUENZA PANEL BY PCR (TYPE A & B)
H1N1 flu by pcr: NOT DETECTED
INFLBPCR: NEGATIVE
Influenza A By PCR: NEGATIVE

## 2014-03-11 LAB — CBC WITH DIFFERENTIAL/PLATELET
BASOS PCT: 0 % (ref 0–1)
Basophils Absolute: 0 10*3/uL (ref 0.0–0.1)
EOS ABS: 0 10*3/uL (ref 0.0–0.7)
EOS PCT: 0 % (ref 0–5)
HCT: 36.6 % (ref 36.0–46.0)
HEMOGLOBIN: 12.1 g/dL (ref 12.0–15.0)
Lymphocytes Relative: 9 % — ABNORMAL LOW (ref 12–46)
Lymphs Abs: 2.3 10*3/uL (ref 0.7–4.0)
MCH: 28.3 pg (ref 26.0–34.0)
MCHC: 33.1 g/dL (ref 30.0–36.0)
MCV: 85.5 fL (ref 78.0–100.0)
MONOS PCT: 6 % (ref 3–12)
Monocytes Absolute: 1.5 10*3/uL — ABNORMAL HIGH (ref 0.1–1.0)
NEUTROS PCT: 85 % — AB (ref 43–77)
Neutro Abs: 22.3 10*3/uL — ABNORMAL HIGH (ref 1.7–7.7)
PLATELETS: 302 10*3/uL (ref 150–400)
RBC: 4.28 MIL/uL (ref 3.87–5.11)
RDW: 12.8 % (ref 11.5–15.5)
WBC: 26.1 10*3/uL — ABNORMAL HIGH (ref 4.0–10.5)

## 2014-03-11 LAB — URINALYSIS, ROUTINE W REFLEX MICROSCOPIC
BILIRUBIN URINE: NEGATIVE
Glucose, UA: NEGATIVE mg/dL
Ketones, ur: 15 mg/dL — AB
LEUKOCYTES UA: NEGATIVE
NITRITE: NEGATIVE
PH: 6.5 (ref 5.0–8.0)
Protein, ur: 30 mg/dL — AB
SPECIFIC GRAVITY, URINE: 1.025 (ref 1.005–1.030)
UROBILINOGEN UA: 0.2 mg/dL (ref 0.0–1.0)

## 2014-03-11 LAB — LACTIC ACID, PLASMA: Lactic Acid, Venous: 0.8 mmol/L (ref 0.5–2.2)

## 2014-03-11 LAB — URINE MICROSCOPIC-ADD ON

## 2014-03-11 LAB — STREP PNEUMONIAE URINARY ANTIGEN: Strep Pneumo Urinary Antigen: NEGATIVE

## 2014-03-11 LAB — MRSA PCR SCREENING: MRSA by PCR: NEGATIVE

## 2014-03-11 MED ORDER — ONDANSETRON HCL 4 MG/2ML IJ SOLN
4.0000 mg | Freq: Once | INTRAMUSCULAR | Status: AC
  Administered 2014-03-11: 4 mg via INTRAVENOUS
  Filled 2014-03-11: qty 2

## 2014-03-11 MED ORDER — DOCUSATE SODIUM 100 MG PO CAPS
200.0000 mg | ORAL_CAPSULE | Freq: Every morning | ORAL | Status: DC
  Administered 2014-03-11 – 2014-03-14 (×4): 200 mg via ORAL
  Filled 2014-03-11 (×5): qty 2

## 2014-03-11 MED ORDER — ALBUTEROL SULFATE (2.5 MG/3ML) 0.083% IN NEBU: 2.5000 mg | INHALATION_SOLUTION | RESPIRATORY_TRACT | Status: DC | PRN

## 2014-03-11 MED ORDER — ALBUTEROL SULFATE (2.5 MG/3ML) 0.083% IN NEBU: 2.5000 mg | INHALATION_SOLUTION | Freq: Four times a day (QID) | RESPIRATORY_TRACT | Status: DC

## 2014-03-11 MED ORDER — LORAZEPAM 0.5 MG PO TABS
0.5000 mg | ORAL_TABLET | Freq: Four times a day (QID) | ORAL | Status: DC | PRN
  Administered 2014-03-11 – 2014-03-14 (×8): 0.5 mg via ORAL
  Filled 2014-03-11 (×8): qty 1

## 2014-03-11 MED ORDER — PREGABALIN 50 MG PO CAPS
50.0000 mg | ORAL_CAPSULE | Freq: Three times a day (TID) | ORAL | Status: DC
  Administered 2014-03-11 – 2014-03-14 (×10): 50 mg via ORAL
  Filled 2014-03-11 (×10): qty 1

## 2014-03-11 MED ORDER — ONDANSETRON HCL 4 MG/2ML IJ SOLN: 4.0000 mg | INTRAMUSCULAR | Status: DC | PRN

## 2014-03-11 MED ORDER — DEXTROSE 5 % IV SOLN
500.0000 mg | Freq: Once | INTRAVENOUS | Status: AC
  Administered 2014-03-11: 500 mg via INTRAVENOUS

## 2014-03-11 MED ORDER — METHYLPREDNISOLONE SODIUM SUCC 125 MG IJ SOLR
80.0000 mg | Freq: Two times a day (BID) | INTRAMUSCULAR | Status: DC
  Administered 2014-03-11 – 2014-03-12 (×3): 80 mg via INTRAVENOUS
  Filled 2014-03-11 (×3): qty 2

## 2014-03-11 MED ORDER — DEXTROSE 5 % IV SOLN
500.0000 mg | INTRAVENOUS | Status: DC
  Administered 2014-03-11: 500 mg via INTRAVENOUS
  Filled 2014-03-11: qty 500

## 2014-03-11 MED ORDER — SODIUM CHLORIDE 0.9 % IJ SOLN
3.0000 mL | Freq: Two times a day (BID) | INTRAMUSCULAR | Status: DC
  Administered 2014-03-12 – 2014-03-14 (×5): 3 mL via INTRAVENOUS

## 2014-03-11 MED ORDER — DEXTROSE 5 % IV SOLN
1.0000 g | Freq: Once | INTRAVENOUS | Status: AC
  Administered 2014-03-11: 1 g via INTRAVENOUS
  Filled 2014-03-11: qty 10

## 2014-03-11 MED ORDER — TIOTROPIUM BROMIDE MONOHYDRATE 18 MCG IN CAPS
18.0000 ug | ORAL_CAPSULE | Freq: Every morning | RESPIRATORY_TRACT | Status: DC
  Administered 2014-03-11 – 2014-03-14 (×4): 18 ug via RESPIRATORY_TRACT
  Filled 2014-03-11: qty 5

## 2014-03-11 MED ORDER — OSELTAMIVIR PHOSPHATE 75 MG PO CAPS
75.0000 mg | ORAL_CAPSULE | Freq: Two times a day (BID) | ORAL | Status: DC
  Administered 2014-03-11: 75 mg via ORAL
  Filled 2014-03-11: qty 1

## 2014-03-11 MED ORDER — SODIUM CHLORIDE 0.9 % IJ SOLN
3.0000 mL | Freq: Two times a day (BID) | INTRAMUSCULAR | Status: DC
  Administered 2014-03-11 – 2014-03-14 (×4): 3 mL via INTRAVENOUS

## 2014-03-11 MED ORDER — LISINOPRIL 10 MG PO TABS
20.0000 mg | ORAL_TABLET | Freq: Every day | ORAL | Status: DC
  Administered 2014-03-11 – 2014-03-14 (×4): 20 mg via ORAL
  Filled 2014-03-11 (×4): qty 2

## 2014-03-11 MED ORDER — BUDESONIDE-FORMOTEROL FUMARATE 160-4.5 MCG/ACT IN AERO: 2.0000 | INHALATION_SPRAY | Freq: Two times a day (BID) | RESPIRATORY_TRACT | Status: DC

## 2014-03-11 MED ORDER — SODIUM CHLORIDE 0.9 % IV BOLUS (SEPSIS)
1000.0000 mL | Freq: Once | INTRAVENOUS | Status: AC
  Administered 2014-03-11: 1000 mL via INTRAVENOUS

## 2014-03-11 MED ORDER — OXYCODONE HCL 5 MG PO TABS
15.0000 mg | ORAL_TABLET | ORAL | Status: DC | PRN
  Administered 2014-03-11 – 2014-03-14 (×19): 15 mg via ORAL
  Filled 2014-03-11 (×19): qty 3

## 2014-03-11 MED ORDER — IPRATROPIUM-ALBUTEROL 0.5-2.5 (3) MG/3ML IN SOLN
3.0000 mL | Freq: Four times a day (QID) | RESPIRATORY_TRACT | Status: DC
Start: 2014-03-11 — End: 2014-03-14
  Administered 2014-03-11 – 2014-03-14 (×11): 3 mL via RESPIRATORY_TRACT
  Filled 2014-03-11 (×12): qty 3

## 2014-03-11 MED ORDER — SODIUM CHLORIDE 0.9 % IV SOLN: 250.0000 mL | INTRAVENOUS | Status: DC | PRN

## 2014-03-11 MED ORDER — ACETAMINOPHEN 325 MG PO TABS
650.0000 mg | ORAL_TABLET | Freq: Once | ORAL | Status: AC
  Administered 2014-03-11: 650 mg via ORAL
  Filled 2014-03-11: qty 2

## 2014-03-11 MED ORDER — GUAIFENESIN-DM 100-10 MG/5ML PO SYRP: 5.0000 mL | ORAL_SOLUTION | ORAL | Status: DC | PRN

## 2014-03-11 MED ORDER — OXYCODONE HCL ER 10 MG PO T12A
10.0000 mg | EXTENDED_RELEASE_TABLET | Freq: Two times a day (BID) | ORAL | Status: DC
  Administered 2014-03-11 – 2014-03-14 (×7): 10 mg via ORAL
  Filled 2014-03-11 (×7): qty 1

## 2014-03-11 MED ORDER — METHOCARBAMOL 500 MG PO TABS
500.0000 mg | ORAL_TABLET | Freq: Two times a day (BID) | ORAL | Status: DC
  Administered 2014-03-11 – 2014-03-14 (×7): 500 mg via ORAL
  Filled 2014-03-11 (×7): qty 1

## 2014-03-11 MED ORDER — IPRATROPIUM BROMIDE 0.02 % IN SOLN: 0.5000 mg | Freq: Four times a day (QID) | RESPIRATORY_TRACT | Status: DC

## 2014-03-11 MED ORDER — ALBUTEROL SULFATE (2.5 MG/3ML) 0.083% IN NEBU
5.0000 mg | INHALATION_SOLUTION | Freq: Once | RESPIRATORY_TRACT | Status: AC
  Administered 2014-03-11: 5 mg via RESPIRATORY_TRACT
  Filled 2014-03-11: qty 6

## 2014-03-11 MED ORDER — ENOXAPARIN SODIUM 40 MG/0.4ML ~~LOC~~ SOLN
40.0000 mg | SUBCUTANEOUS | Status: DC
  Administered 2014-03-11 – 2014-03-14 (×4): 40 mg via SUBCUTANEOUS
  Filled 2014-03-11 (×4): qty 0.4

## 2014-03-11 MED ORDER — SODIUM CHLORIDE 0.9 % IJ SOLN
3.0000 mL | INTRAMUSCULAR | Status: DC | PRN
Start: 2014-03-11 — End: 2014-03-14

## 2014-03-11 MED ORDER — DEXTROSE 5 % IV SOLN
1.0000 g | INTRAVENOUS | Status: DC
  Administered 2014-03-11 – 2014-03-13 (×3): 1 g via INTRAVENOUS
  Filled 2014-03-11 (×3): qty 10

## 2014-03-11 MED ORDER — SUMATRIPTAN SUCCINATE 50 MG PO TABS
100.0000 mg | ORAL_TABLET | ORAL | Status: DC | PRN
  Administered 2014-03-11 – 2014-03-14 (×4): 100 mg via ORAL
  Filled 2014-03-11: qty 1
  Filled 2014-03-11 (×2): qty 2

## 2014-03-11 MED ORDER — SODIUM CHLORIDE 0.9 % IJ SOLN: 3.0000 mL | INTRAMUSCULAR | Status: DC | PRN

## 2014-03-11 MED ORDER — MOMETASONE FURO-FORMOTEROL FUM 200-5 MCG/ACT IN AERO
2.0000 | INHALATION_SPRAY | Freq: Two times a day (BID) | RESPIRATORY_TRACT | Status: DC
  Administered 2014-03-11 – 2014-03-14 (×7): 2 via RESPIRATORY_TRACT
  Filled 2014-03-11: qty 8.8

## 2014-03-11 MED ORDER — SODIUM CHLORIDE 0.9 % IV SOLN
250.0000 mL | INTRAVENOUS | Status: DC | PRN
Start: 2014-03-11 — End: 2014-03-14

## 2014-03-11 MED ORDER — HYDROCODONE-ACETAMINOPHEN 5-325 MG PO TABS
1.0000 | ORAL_TABLET | Freq: Once | ORAL | Status: AC
  Administered 2014-03-11: 1 via ORAL
  Filled 2014-03-11: qty 1

## 2014-03-11 MED ORDER — SODIUM CHLORIDE 0.9 % IV SOLN: INTRAVENOUS | Status: DC

## 2014-03-11 MED ORDER — ONDANSETRON HCL 4 MG/2ML IJ SOLN
4.0000 mg | INTRAMUSCULAR | Status: DC | PRN
  Administered 2014-03-11: 4 mg via INTRAVENOUS
  Filled 2014-03-11: qty 2

## 2014-03-11 NOTE — Progress Notes (Signed)
ANTIBIOTIC CONSULT NOTE  Pharmacy Consult for Renal Adjustment Indication: pneumonia  Allergies  Allergen Reactions  . Penicillins Other (See Comments)    convulsions  . Levaquin [Levofloxacin In D5w] Itching  . Morphine Nausea Only    Patient Measurements: Height: 5\' 1"  (154.9 cm) Weight: 152 lb (68.947 kg) IBW/kg (Calculated) : 47.8   Vital Signs: Temp: 99.3 F (37.4 C) (04/19 0450) Temp src: Oral (04/19 0450) BP: 112/68 mmHg (04/19 0450) Pulse Rate: 106 (04/19 0450) Intake/Output from previous day:   Intake/Output from this shift:    Labs:  Recent Labs  03/11/14 0222  WBC 26.1*  HGB 12.1  PLT 302  CREATININE 0.35*   Estimated Creatinine Clearance: 78.8 ml/min (by C-G formula based on Cr of 0.35). No results found for this basename: VANCOTROUGH, Leodis BinetVANCOPEAK, VANCORANDOM, GENTTROUGH, GENTPEAK, GENTRANDOM, TOBRATROUGH, TOBRAPEAK, TOBRARND, AMIKACINPEAK, AMIKACINTROU, AMIKACIN,  in the last 72 hours   Microbiology: Recent Results (from the past 720 hour(s))  CULTURE, BLOOD (ROUTINE X 2)     Status: None   Collection Time    03/11/14  2:21 AM      Result Value Ref Range Status   Specimen Description Blood BLOOD RIGHT HAND   Final   Special Requests BOTTLES DRAWN AEROBIC AND ANAEROBIC 6CC   Final   Culture PENDING   Incomplete   Report Status PENDING   Incomplete  CULTURE, BLOOD (ROUTINE X 2)     Status: None   Collection Time    03/11/14  2:27 AM      Result Value Ref Range Status   Specimen Description Blood RIGHT ANTECUBITAL   Final   Special Requests BOTTLES DRAWN AEROBIC AND ANAEROBIC 6CC   Final   Culture PENDING   Incomplete   Report Status PENDING   Incomplete    Anti-infectives   Start     Dose/Rate Route Frequency Ordered Stop   03/11/14 2300  azithromycin (ZITHROMAX) 500 mg in dextrose 5 % 250 mL IVPB     500 mg 250 mL/hr over 60 Minutes Intravenous Every 24 hours 03/11/14 0532 03/18/14 2259   03/11/14 2200  cefTRIAXone (ROCEPHIN) 1 g in  dextrose 5 % 50 mL IVPB     1 g 100 mL/hr over 30 Minutes Intravenous Every 24 hours 03/11/14 0532 03/18/14 2159   03/11/14 0545  oseltamivir (TAMIFLU) capsule 75 mg     75 mg Oral 2 times daily 03/11/14 0532 03/16/14 0959   03/11/14 0345  azithromycin (ZITHROMAX) 500 mg in dextrose 5 % 250 mL IVPB     500 mg 250 mL/hr over 60 Minutes Intravenous  Once 03/11/14 0334 03/11/14 0521   03/11/14 0345  cefTRIAXone (ROCEPHIN) 1 g in dextrose 5 % 50 mL IVPB     1 g 100 mL/hr over 30 Minutes Intravenous  Once 03/11/14 0334 03/11/14 0420      Assessment: Patient on Rocephin and Zithromax for CAP, no renal adjustment needed.  Goal of Therapy:  Eradicate infection.   Plan:  Continue current regimen. Sign off.  Mady GemmaMichael R Dejanira Pamintuan 03/11/2014,8:08 AM

## 2014-03-11 NOTE — ED Provider Notes (Signed)
CSN: [id number]     Arrival date & time 03/11/14  0137 History   First MD Initiated Contact with Patient 03/11/14 0148     Chief Complaint  Patient presents with  . Shortness of Breath     (Consider location/radiation/quality/duration/timing/severity/associated sxs/prior Treatment) HPI Patient presents with increasing shortness of breath starting this evening. She's had 3 days of productive cough of green-yellow sputum. She has a history of COPD and is on 4 L of supplemental oxygen. She's given 2 breathing treatments and 125 milligrams of Solu-Medrol en route with small amount of improvement. She admits to fever and chills. She denies any chest pain at this point. Past Medical History  Diagnosis Date  . Migraine headache   . On home O2   . COPD (chronic obstructive pulmonary disease)   . Chronic back pain   . Hyperglycemia, drug-induced     steroid induced hyperglycemia  . ARDS (adult respiratory distress syndrome)     Jan 2011  . Diabetes mellitus   . Pneumonia   . Angina   . Asthma   . Shortness of breath   . Recurrent upper respiratory infection (URI)   . GERD (gastroesophageal reflux disease) 12/21/2012   Past Surgical History  Procedure Laterality Date  . Tubal ligation    . Uterine ablasion    . C-section    . Tracheostomy      decannulated 12/2009   Family History  Problem Relation Age of Onset  . Coronary artery disease Brother   . Diabetes Other   . Cancer Other   . Hypertension Other    History  Substance Use Topics  . Smoking status: Current Every Day Smoker -- 0.25 packs/day for 33 years    Types: Cigarettes  . Smokeless tobacco: Not on file     Comment: 1/2 pack a day  since age 89  . Alcohol Use: No   OB History   Grav Para Term Preterm Abortions TAB SAB Ect Mult Living                 Review of Systems  Constitutional: Positive for fever, chills and fatigue.  HENT: Negative for sinus pressure and sore throat.   Respiratory: Positive for cough,  shortness of breath and wheezing.   Cardiovascular: Negative for chest pain, palpitations and leg swelling.  Gastrointestinal: Negative for nausea, vomiting, abdominal pain and diarrhea.  Genitourinary: Negative for dysuria.  Musculoskeletal: Negative for back pain, myalgias, neck pain and neck stiffness.  Skin: Negative for rash and wound.  Neurological: Negative for dizziness, weakness, light-headedness, numbness and headaches.  All other systems reviewed and are negative.     Allergies  Penicillins; Levaquin; and Morphine  Home Medications   Prior to Admission medications   Medication Sig Start Date End Date Taking? Authorizing Provider  acetaminophen (TYLENOL) 500 MG tablet Take 500 mg by mouth every 6 (six) hours as needed for pain.   Yes Historical Provider, MD  albuterol (PROAIR HFA) 108 (90 BASE) MCG/ACT inhaler Inhale 2 puffs into the lungs every 6 (six) hours as needed. For shortness of breATH   Yes Historical Provider, MD  albuterol (PROVENTIL) (2.5 MG/3ML) 0.083% nebulizer solution Take 2.5 mg by nebulization every 4 (four) hours as needed. For shortness of breath  03/19/11  Yes Mistina S Parrett, NP  aspirin-acetaminophen-caffeine (EXCEDRIN MIGRAINE) 250-250-65 MG per tablet Take 1 tablet by mouth every 6 (six) hours as needed for pain.   Yes Historical Provider, MD  budesonide-formoterol (SYMBICORT) 160-4.5  MCG/ACT inhaler Inhale 2 puffs into the lungs 2 (two) times daily.   Yes Historical Provider, MD  Choline Fenofibrate (TRILIPIX) 135 MG capsule Take 135 mg by mouth every morning.    Yes Historical Provider, MD  Diphenhyd-Hydrocort-Nystatin (FIRST-DUKES MOUTHWASH MT) Use as directed 5 mLs in the mouth or throat daily as needed. FOR THRUSH   Yes Historical Provider, MD  docusate sodium (COLACE) 100 MG capsule Take 200 mg by mouth every morning.   Yes Historical Provider, MD  fluticasone (FLONASE) 50 MCG/ACT nasal spray Place 2 sprays into the nose daily.   Yes Historical  Provider, MD  lisinopril (PRINIVIL,ZESTRIL) 20 MG tablet Take 20 mg by mouth daily.   Yes Historical Provider, MD  LORazepam (ATIVAN) 0.5 MG tablet Take 1 tablet (0.5 mg total) by mouth every 6 (six) hours as needed for anxiety. 04/24/13  Yes Fredirick Maudlin, MD  methocarbamol (ROBAXIN) 500 MG tablet Take 500 mg by mouth 2 (two) times daily.   Yes Historical Provider, MD  OxyCODONE (OXYCONTIN) 10 mg T12A Take 1 tablet (10 mg total) by mouth every 12 (twelve) hours. 04/24/13  Yes Fredirick Maudlin, MD  Oxymetazoline HCl (AFRIN NASAL SPRAY NA) Place into the nose.   Yes Historical Provider, MD  pantoprazole (PROTONIX) 40 MG tablet TAKE 1 TABLET BY MOUTH DAILY   Yes Len Blalock, NP  pregabalin (LYRICA) 50 MG capsule Take 50 mg by mouth at bedtime.   Yes Historical Provider, MD  promethazine (PHENERGAN) 25 MG tablet Take 25 mg by mouth every 6 (six) hours as needed. For nausea   Yes Historical Provider, MD  Simethicone (GAS-X EXTRA STRENGTH) 125 MG CAPS Take 2 capsules by mouth every morning.   Yes Historical Provider, MD  SUMAtriptan (IMITREX) 25 MG tablet Take 25 mg by mouth every 2 (two) hours as needed.   Yes Historical Provider, MD  oxyCODONE (ROXICODONE) 15 MG immediate release tablet Take 15 mg by mouth every 4 (four) hours as needed for pain.    Historical Provider, MD  tiotropium (SPIRIVA) 18 MCG inhalation capsule Place 18 mcg into inhaler and inhale every morning. 06/17/12 06/17/13  Leslye Peer, MD   BP 136/71  Pulse 130  Temp(Src) 100 F (37.8 C) (Oral)  Resp 22  Ht 5\' 1"  (1.549 m)  Wt 148 lb (67.132 kg)  BMI 27.98 kg/m2  SpO2 93% Physical Exam  Nursing note and vitals reviewed. Constitutional: She is oriented to person, place, and time. She appears well-developed and well-nourished. She appears distressed.  Mild to moderate respiratory distress.  HENT:  Head: Normocephalic and atraumatic.  Mouth/Throat: Oropharynx is clear and moist. No oropharyngeal exudate.  Eyes: EOM are  normal. Pupils are equal, round, and reactive to light.  Neck: Normal range of motion. Neck supple.  Cardiovascular: Normal rate and regular rhythm.   Pulmonary/Chest: She is in respiratory distress. She has wheezes. She has rales.  Bilateral rhonchi and extremities is throughout. Mild tachypnea and increased work of breathing.  Abdominal: Soft. Bowel sounds are normal. She exhibits no distension and no mass. There is no tenderness. There is no rebound and no guarding.  Musculoskeletal: Normal range of motion. She exhibits no edema and no tenderness.  No calf swelling or tenderness.  Neurological: She is alert and oriented to person, place, and time.  Moves all extremities without deficit. Sensation is grossly intact.  Skin: Skin is warm and dry. No rash noted. No erythema.  Psychiatric: She has a normal mood and  affect. Her behavior is normal.    ED Course  Procedures (including critical care time) Labs Review Labs Reviewed  CBC WITH DIFFERENTIAL - Abnormal; Notable for the following:    WBC 26.1 (*)    Neutrophils Relative % 85 (*)    Neutro Abs 22.3 (*)    Lymphocytes Relative 9 (*)    Monocytes Absolute 1.5 (*)    All other components within normal limits  COMPREHENSIVE METABOLIC PANEL - Abnormal; Notable for the following:    Chloride 95 (*)    Glucose, Bld 216 (*)    Creatinine, Ser 0.35 (*)    Albumin 3.3 (*)    All other components within normal limits  URINALYSIS, ROUTINE W REFLEX MICROSCOPIC - Abnormal; Notable for the following:    Hgb urine dipstick SMALL (*)    Ketones, ur 15 (*)    Protein, ur 30 (*)    All other components within normal limits  URINE MICROSCOPIC-ADD ON - Abnormal; Notable for the following:    Squamous Epithelial / LPF FEW (*)    Bacteria, UA FEW (*)    All other components within normal limits  CULTURE, BLOOD (ROUTINE X 2)  CULTURE, BLOOD (ROUTINE X 2)  URINE CULTURE  LACTIC ACID, PLASMA    Imaging Review Dg Chest 2 View  03/11/2014    CLINICAL DATA:  Shortness of breath since 10 p.m. Productive cough for 3 days.  EXAM: CHEST  2 VIEW  COMPARISON:  04/21/2013  FINDINGS: Developing patchy airspace disease in the right lung base suggesting developing pneumonia. Hyperinflation and interstitial changes probably underlying emphysema. No pleural effusion or pneumothorax. Normal heart size and pulmonary vascularity.  IMPRESSION: Developing patchy airspace disease in the right lung base on chronic emphysema.   Electronically Signed   By: Burman NievesWilliam  Stevens M.D.   On: 03/11/2014 02:44     EKG Interpretation None      MDM   Final diagnoses:  Community acquired pneumonia  COPD exacerbation    After third nebulizer the patient with mild improvement. Will start on antibiotics for right base pneumonia. I discussed with [facility] hospitalist and they will admit the patient.    Loren Raceravid Silver Achey, MD 03/11/14 [phone]

## 2014-03-11 NOTE — ED Notes (Signed)
Kim, RT, in to complete nebulizer order.

## 2014-03-11 NOTE — Progress Notes (Signed)
Subjective: She was admitted with pneumonia. There is some question of influenza because of her recent outbreak of influenza B. She's coughing up some sputum  Objective: Vital signs in last 24 hours: Temp:  [99.3 F (37.4 C)-100.1 F (37.8 C)] 99.3 F (37.4 C) (04/19 0450) Pulse Rate:  [106-131] 106 (04/19 0450) Resp:  [22-24] 22 (04/19 0450) BP: (112-136)/(59-71) 112/68 mmHg (04/19 0450) SpO2:  [93 %-96 %] 94 % (04/19 0707) Weight:  [67.132 kg (148 lb)-68.947 kg (152 lb)] 68.947 kg (152 lb) (04/19 0450) Weight change:     Intake/Output from previous day:    PHYSICAL EXAM General appearance: alert, cooperative and mild distress Resp: rhonchi bilaterally Cardio: regular rate and rhythm, S1, S2 normal, no murmur, click, rub or gallop GI: soft, non-tender; bowel sounds normal; no masses,  no organomegaly Extremities: extremities normal, atraumatic, no cyanosis or edema  Lab Results:    Basic Metabolic Panel:  Recent Labs  16/08/9603/19/15 0222  NA 137  K 3.7  CL 95*  CO2 30  GLUCOSE 216*  BUN 10  CREATININE 0.35*  CALCIUM 9.2   Liver Function Tests:  Recent Labs  03/11/14 0222  AST 15  ALT 9  ALKPHOS 75  BILITOT 0.6  PROT 7.5  ALBUMIN 3.3*   No results found for this basename: LIPASE, AMYLASE,  in the last 72 hours No results found for this basename: AMMONIA,  in the last 72 hours CBC:  Recent Labs  03/11/14 0222  WBC 26.1*  NEUTROABS 22.3*  HGB 12.1  HCT 36.6  MCV 85.5  PLT 302   Cardiac Enzymes: No results found for this basename: CKTOTAL, CKMB, CKMBINDEX, TROPONINI,  in the last 72 hours BNP: No results found for this basename: PROBNP,  in the last 72 hours D-Dimer: No results found for this basename: DDIMER,  in the last 72 hours CBG: No results found for this basename: GLUCAP,  in the last 72 hours Hemoglobin A1C: No results found for this basename: HGBA1C,  in the last 72 hours Fasting Lipid Panel: No results found for this basename: CHOL,  HDL, LDLCALC, TRIG, CHOLHDL, LDLDIRECT,  in the last 72 hours Thyroid Function Tests: No results found for this basename: TSH, T4TOTAL, FREET4, T3FREE, THYROIDAB,  in the last 72 hours Anemia Panel: No results found for this basename: VITAMINB12, FOLATE, FERRITIN, TIBC, IRON, RETICCTPCT,  in the last 72 hours Coagulation: No results found for this basename: LABPROT, INR,  in the last 72 hours Urine Drug Screen: Drugs of Abuse     Component Value Date/Time   LABOPIA  Value: POSITIVE (NOTE) Sent for confirmatory testing Result repeated and verified.* 02/26/2011 2234   LABOPIA POSITIVE* 05/01/2010 1819   COCAINSCRNUR NEGATIVE 02/26/2011 2234   COCAINSCRNUR NONE DETECTED 05/01/2010 1819   LABBENZ  Value: POSITIVE QUANTITY NOT SUFFICIENT, UNABLE TO PERFORM TEST Unable to perform confirmation testing per [facility] (NOTE) Sent for confirmatory testing Result repeated and verified.* 02/26/2011 2234   LABBENZ POSITIVE* 05/01/2010 1819   AMPHETMU NEGATIVE 02/26/2011 2234   AMPHETMU NONE DETECTED 05/01/2010 1819   THCU POSITIVE* 05/01/2010 1819   LABBARB  Value: NONE DETECTED        DRUG SCREEN FOR MEDICAL PURPOSES ONLY.  IF CONFIRMATION IS NEEDED FOR ANY PURPOSE, NOTIFY LAB WITHIN 5 DAYS.        LOWEST DETECTABLE LIMITS FOR URINE DRUG SCREEN Drug Class       Cutoff (ng/mL) Amphetamine      1000 Barbiturate  200 Benzodiazepine   200 Tricyclics       300 Opiates          300 Cocaine          300 THC              50 05/01/2010 1819    Alcohol Level: No results found for this basename: ETH,  in the last 72 hours Urinalysis:  Recent Labs  03/11/14 0245  COLORURINE YELLOW  LABSPEC 1.025  PHURINE 6.5  GLUCOSEU NEGATIVE  HGBUR SMALL*  BILIRUBINUR NEGATIVE  KETONESUR 15*  PROTEINUR 30*  UROBILINOGEN 0.2  NITRITE NEGATIVE  LEUKOCYTESUR NEGATIVE   Misc. Labs:  ABGS No results found for this basename: PHART, PCO2, PO2ART, TCO2, HCO3,  in the last 72 hours CULTURES Recent Results (from the past  240 hour(s))  CULTURE, BLOOD (ROUTINE X 2)     Status: None   Collection Time    03/11/14  2:21 AM      Result Value Ref Range Status   Specimen Description Blood BLOOD RIGHT HAND   Final   Special Requests BOTTLES DRAWN AEROBIC AND ANAEROBIC 6CC   Final   Culture PENDING   Incomplete   Report Status PENDING   Incomplete  CULTURE, BLOOD (ROUTINE X 2)     Status: None   Collection Time    03/11/14  2:27 AM      Result Value Ref Range Status   Specimen Description Blood RIGHT ANTECUBITAL   Final   Special Requests BOTTLES DRAWN AEROBIC AND ANAEROBIC [facility]6CC   Final   Culture PENDING   Incomplete   Report Status PENDING   Incomplete   Studies/Results: Dg Chest 2 View  03/11/2014   CLINICAL DATA:  Shortness of breath since 10 p.m. Productive cough for 3 days.  EXAM: CHEST  2 VIEW  COMPARISON:  04/21/2013  FINDINGS: Developing patchy airspace disease in the right lung base suggesting developing pneumonia. Hyperinflation and interstitial changes probably underlying emphysema. No pleural effusion or pneumothorax. Normal heart size and pulmonary vascularity.  IMPRESSION: Developing patchy airspace disease in the right lung base on chronic emphysema.   Electronically Signed   By: Burman NievesWilliam  Stevens M.D.   On: 03/11/2014 02:44    Medications:  Prior to Admission:  Prescriptions prior to admission  Medication Sig Dispense Refill  . acetaminophen (TYLENOL) 500 MG tablet Take 500 mg by mouth every 6 (six) hours as needed for pain.      [name]. albuterol (PROAIR HFA) 108 (90 BASE) MCG/ACT inhaler Inhale 2 puffs into the lungs every 6 (six) hours as needed. For shortness of breATH      . albuterol (PROVENTIL) (2.5 MG/3ML) 0.083% nebulizer solution Take 2.5 mg by nebulization every 4 (four) hours as needed. For shortness of breath       . aspirin-acetaminophen-caffeine (EXCEDRIN MIGRAINE) 250-250-65 MG per tablet Take 1 tablet by mouth every 6 (six) hours as needed for pain.      . budesonide-formoterol  (SYMBICORT) 160-4.5 MCG/ACT inhaler Inhale 2 puffs into the lungs 2 (two) times daily.      . Choline Fenofibrate (TRILIPIX) 135 MG capsule Take 135 mg by mouth every morning.       . Diphenhyd-Hydrocort-Nystatin (FIRST-DUKES MOUTHWASH MT) Use as directed 5 mLs in the mouth or throat daily as needed. FOR THRUSH      . docusate sodium (COLACE) 100 MG capsule Take 200 mg by mouth every morning.      . fluticasone (FLONASE) 50  MCG/ACT nasal spray Place 2 sprays into the nose daily.      [name] lisinopril (PRINIVIL,ZESTRIL) 20 MG tablet Take 20 mg by mouth daily.      [name] LORazepam (ATIVAN) 0.5 MG tablet Take 1 tablet (0.5 mg total) by mouth every 6 (six) hours as needed for anxiety.  60 tablet  1  . methocarbamol (ROBAXIN) 500 MG tablet Take 500 mg by mouth 2 (two) times daily.      . OxyCODONE (OXYCONTIN) 10 mg T12A Take 1 tablet (10 mg total) by mouth every 12 (twelve) hours.  60 tablet  0  . Oxymetazoline HCl (AFRIN NASAL SPRAY NA) Place into the nose.      . pantoprazole (PROTONIX) 40 MG tablet TAKE 1 TABLET BY MOUTH DAILY  30 tablet  2  . pregabalin (LYRICA) 50 MG capsule Take 50 mg by mouth every 8 (eight) hours.       . promethazine (PHENERGAN) 25 MG tablet Take 25 mg by mouth every 6 (six) hours as needed. For nausea      . Simethicone (GAS-X EXTRA STRENGTH) 125 MG CAPS Take 2 capsules by mouth every morning.      . SUMAtriptan (IMITREX) 25 MG tablet Take 25 mg by mouth every 2 (two) hours as needed.      [name] oxyCODONE (ROXICODONE) 15 MG immediate release tablet Take 15 mg by mouth every 4 (four) hours as needed for pain.      [name] tiotropium (SPIRIVA) 18 MCG inhalation capsule Place 18 mcg into inhaler and inhale every morning.       Scheduled: . azithromycin  500 mg Intravenous Q24H  . cefTRIAXone (ROCEPHIN)  IV  1 g Intravenous Q24H  . docusate sodium  200 mg Oral q morning - 10a  . enoxaparin (LOVENOX) injection  40 mg Subcutaneous Q24H  . ipratropium-albuterol  3 mL Nebulization Q6H  .  lisinopril  20 mg Oral Daily  . methocarbamol  500 mg Oral BID  . methylPREDNISolone (SOLU-MEDROL) injection  80 mg Intravenous Q12H  . mometasone-formoterol  2 puff Inhalation BID  . oseltamivir  75 mg Oral BID  . OxyCODONE  10 mg Oral Q12H  . pregabalin  50 mg Oral 3 times per day  . sodium chloride  3 mL Intravenous Q12H  . sodium chloride  3 mL Intravenous Q12H  . tiotropium  18 mcg Inhalation q morning - 10a   Continuous:  ZOX:WRUEAV chloride, sodium chloride, albuterol, guaiFENesin-dextromethorphan, LORazepam, oxyCODONE, sodium chloride, sodium chloride  Assesment: She has community-acquired pneumonia. She continues to smoke cigarettes. She has severe COPD. She has diabetes which is stable Principal Problem:   CAP (community acquired pneumonia) Active Problems:   TOBACCO ABUSE   NARCOTIC ABUSE   DM (diabetes mellitus)   COPD exacerbation   Community acquired pneumonia    Plan: Continue with IV antibiotics et Karie Soda.    LOS: 0 days   Fredirick Maudlin 03/11/2014, 8:21 AM

## 2014-03-11 NOTE — ED Notes (Signed)
Per EMS, patient c/o shortness of breath since 10pm; states has had productive cough x 3 days.  Patient had nebulizer x 2 en route.

## 2014-03-11 NOTE — H&P (Signed)
PCP:   Fredirick Maudlin, MD   Chief Complaint:  Sob, cough , fever  HPI: 48 yo female h/o severe copd on 4 L oxygen [place] cont, dm, tobacco abuse comes in with about 2 days of worsening sob and wheezing, nasal congestion and fever.  No n/v/d.  She has been coughing a lot.  No sick contacts.  Has not been able to smoke in 3 days.  No le edema or swelling.  No cp.  Did not get flu shot this year.  Was worsening despite her extensive home respiratory regimen.  Not been on abx or steroids in months.  Review of Systems:  Positive and negative as per HPI otherwise all other systems are negative  Past Medical History: Past Medical History  Diagnosis Date  . Migraine headache   . On home O2   . COPD (chronic obstructive pulmonary disease)   . Chronic back pain   . Hyperglycemia, drug-induced     steroid induced hyperglycemia  . ARDS (adult respiratory distress syndrome)     Jan 2011  . Diabetes mellitus   . Pneumonia   . Angina   . Asthma   . Shortness of breath   . Recurrent upper respiratory infection (URI)   . GERD (gastroesophageal reflux disease) 12/21/2012   Past Surgical History  Procedure Laterality Date  . Tubal ligation    . Uterine ablasion    . C-section    . Tracheostomy      decannulated 12/2009    Medications: Prior to Admission medications   Medication Sig Start Date End Date Taking? Authorizing Provider  acetaminophen (TYLENOL) 500 MG tablet Take 500 mg by mouth every 6 (six) hours as needed for pain.   Yes Historical Provider, MD  albuterol (PROAIR HFA) 108 (90 BASE) MCG/ACT inhaler Inhale 2 puffs into the lungs every 6 (six) hours as needed. For shortness of breATH   Yes Historical Provider, MD  albuterol (PROVENTIL) (2.5 MG/3ML) 0.083% nebulizer solution Take 2.5 mg by nebulization every 4 (four) hours as needed. For shortness of breath  03/19/11  Yes Avrey S Parrett, NP  aspirin-acetaminophen-caffeine (EXCEDRIN MIGRAINE) 250-250-65 MG per tablet Take 1 tablet by  mouth every 6 (six) hours as needed for pain.   Yes Historical Provider, MD  budesonide-formoterol (SYMBICORT) 160-4.5 MCG/ACT inhaler Inhale 2 puffs into the lungs 2 (two) times daily.   Yes Historical Provider, MD  Choline Fenofibrate (TRILIPIX) 135 MG capsule Take 135 mg by mouth every morning.    Yes Historical Provider, MD  Diphenhyd-Hydrocort-Nystatin (FIRST-DUKES MOUTHWASH MT) Use as directed 5 mLs in the mouth or throat daily as needed. FOR THRUSH   Yes Historical Provider, MD  docusate sodium (COLACE) 100 MG capsule Take 200 mg by mouth every morning.   Yes Historical Provider, MD  fluticasone (FLONASE) 50 MCG/ACT nasal spray Place 2 sprays into the nose daily.   Yes Historical Provider, MD  lisinopril (PRINIVIL,ZESTRIL) 20 MG tablet Take 20 mg by mouth daily.   Yes Historical Provider, MD  LORazepam (ATIVAN) 0.5 MG tablet Take 1 tablet (0.5 mg total) by mouth every 6 (six) hours as needed for anxiety. 04/24/13  Yes Fredirick Maudlin, MD  methocarbamol (ROBAXIN) 500 MG tablet Take 500 mg by mouth 2 (two) times daily.   Yes Historical Provider, MD  OxyCODONE (OXYCONTIN) 10 mg T12A Take 1 tablet (10 mg total) by mouth every 12 (twelve) hours. 04/24/13  Yes Fredirick Maudlin, MD  Oxymetazoline HCl (AFRIN NASAL SPRAY NA) Place  into the nose.   Yes Historical Provider, MD  pantoprazole (PROTONIX) 40 MG tablet TAKE 1 TABLET BY MOUTH DAILY   Yes Len Blalockerri L Setzer, NP  pregabalin (LYRICA) 50 MG capsule Take 50 mg by mouth at bedtime.   Yes Historical Provider, MD  promethazine (PHENERGAN) 25 MG tablet Take 25 mg by mouth every 6 (six) hours as needed. For nausea   Yes Historical Provider, MD  Simethicone (GAS-X EXTRA STRENGTH) 125 MG CAPS Take 2 capsules by mouth every morning.   Yes Historical Provider, MD  SUMAtriptan (IMITREX) 25 MG tablet Take 25 mg by mouth every 2 (two) hours as needed.   Yes Historical Provider, MD  oxyCODONE (ROXICODONE) 15 MG immediate release tablet Take 15 mg by mouth every 4  (four) hours as needed for pain.    Historical Provider, MD  tiotropium (SPIRIVA) 18 MCG inhalation capsule Place 18 mcg into inhaler and inhale every morning. 06/17/12 06/17/13  Leslye Peerobert S Byrum, MD    Allergies:   Allergies  Allergen Reactions  . Penicillins Other (See Comments)    convulsions  . Levaquin [Levofloxacin In D5w] Itching  . Morphine Nausea Only    Social History:  reports that she has been smoking Cigarettes.  She has a 8.25 pack-year smoking history. She does not have any smokeless tobacco history on file. She reports that she does not drink alcohol or use illicit drugs.  Family History: Family History  Problem Relation Age of Onset  . Coronary artery disease Brother   . Diabetes Other   . Cancer Other   . Hypertension Other     Physical Exam: Filed Vitals:   03/11/14 0140 03/11/14 0324  BP: 127/65 136/71  Pulse: 131 130  Temp: 100.1 F (37.8 C) 100 F (37.8 C)  TempSrc: Oral Oral  Resp: 24 22  Height: 5\' 1"  (1.549 m)   Weight: 67.132 kg (148 lb)   SpO2: 93% 93%   General appearance: alert, cooperative and mild distress Head: Normocephalic, without obvious abnormality, atraumatic Eyes: negative Nose: Nares normal. Septum midline. Mucosa normal. No drainage or sinus tenderness. Neck: no JVD and supple, symmetrical, trachea midline Lungs: diminished breath sounds bilaterally and wheezes bilaterally Heart: regular rate and rhythm, S1, S2 normal, no murmur, click, rub or gallop Abdomen: soft, non-tender; bowel sounds normal; no masses,  no organomegaly Extremities: extremities normal, atraumatic, no cyanosis or edema Pulses: 2+ and symmetric Skin: Skin color, texture, turgor normal. No rashes or lesions Neurologic: Grossly normal mentating nml    Labs on Admission:   Recent Labs  03/11/14 0222  NA 137  K 3.7  CL 95*  CO2 30  GLUCOSE 216*  BUN 10  CREATININE 0.35*  CALCIUM 9.2    Recent Labs  03/11/14 0222  AST 15  ALT 9  ALKPHOS 75   BILITOT 0.6  PROT 7.5  ALBUMIN 3.3*    Recent Labs  03/11/14 0222  WBC 26.1*  NEUTROABS 22.3*  HGB 12.1  HCT 36.6  MCV 85.5  PLT 302    Radiological Exams on Admission: Dg Chest 2 View  03/11/2014   CLINICAL DATA:  Shortness of breath since 10 p.m. Productive cough for 3 days.  EXAM: CHEST  2 VIEW  COMPARISON:  04/21/2013  FINDINGS: Developing patchy airspace disease in the right lung base suggesting developing pneumonia. Hyperinflation and interstitial changes probably underlying emphysema. No pleural effusion or pneumothorax. Normal heart size and pulmonary vascularity.  IMPRESSION: Developing patchy airspace disease in the right lung  base on chronic emphysema.   Electronically Signed   By: Burman NievesWilliam  Stevens M.D.   On: 03/11/2014 02:44    Assessment/Plan  48 yo female with acute resp failure due to pna and severe copde  Principal Problem:   CAP (community acquired pneumonia)-  Place on iv rocephin and azithro.  Also ck quickflu (have seen several type B positives in last week), place on tamiflu.  Solumedrol.  freq nebs.  Oxygen.   pna pathway.  Active Problems:   TOBACCO ABUSE  Advised to stop smoking   NARCOTIC ABUSE   DM (diabetes mellitus)   COPD exacerbation  Solumedrol, nebs, oxgyen.  abx as above.  Full code.  pcp dr Juanetta Goslinghawkins.  Emiliano Welshans A Onalee HuaDavid 03/11/2014, 3:50 AM

## 2014-03-12 LAB — LEGIONELLA ANTIGEN, URINE: Legionella Antigen, Urine: NEGATIVE

## 2014-03-12 LAB — GLUCOSE, CAPILLARY
GLUCOSE-CAPILLARY: 295 mg/dL — AB (ref 70–99)
Glucose-Capillary: 270 mg/dL — ABNORMAL HIGH (ref 70–99)
Glucose-Capillary: 129 mg/dL — ABNORMAL HIGH (ref 70–99)
Glucose-Capillary: 224 mg/dL — ABNORMAL HIGH (ref 70–99)

## 2014-03-12 LAB — URINE CULTURE
COLONY COUNT: NO GROWTH
CULTURE: NO GROWTH

## 2014-03-12 MED ORDER — INSULIN ASPART 100 UNIT/ML ~~LOC~~ SOLN
0.0000 [IU] | Freq: Three times a day (TID) | SUBCUTANEOUS | Status: DC
Start: 2014-03-12 — End: 2014-03-14
  Administered 2014-03-12: 2 [IU] via SUBCUTANEOUS
  Administered 2014-03-12: 8 [IU] via SUBCUTANEOUS
  Administered 2014-03-13: 15 [IU] via SUBCUTANEOUS
  Administered 2014-03-13: 8 [IU] via SUBCUTANEOUS
  Administered 2014-03-14: 3 [IU] via SUBCUTANEOUS

## 2014-03-12 MED ORDER — PREDNISONE 20 MG PO TABS
40.0000 mg | ORAL_TABLET | Freq: Every day | ORAL | Status: DC
  Administered 2014-03-12 – 2014-03-14 (×3): 40 mg via ORAL
  Filled 2014-03-12 (×3): qty 2

## 2014-03-12 MED ORDER — AZITHROMYCIN 250 MG PO TABS
500.0000 mg | ORAL_TABLET | Freq: Every day | ORAL | Status: DC
  Administered 2014-03-12 – 2014-03-13 (×2): 500 mg via ORAL
  Filled 2014-03-12 (×2): qty 2

## 2014-03-12 NOTE — Progress Notes (Signed)
PHARMACIST - PHYSICIAN COMMUNICATION DR:   Hawkins CONCERNING: Antibiotic IV to Oral Route Change Policy  RECOMMENDATION: This patient is receiving Zithromax by the intravenous route.  Based on criteria approved by the Pharmacy and Therapeutics Committee, the antibiotic(s) is/are being converted to the equivalent oral dose form(s).   DESCRIPTION: These criteria include:  Patient being treated for a respiratory tract infection, urinary tract infection, cellulitis or clostridium difficile associated diarrhea if on metronidazole  The patient is not neutropenic and does not exhibit a GI malabsorption state  The patient is eating (either orally or via tube) and/or has been taking other orally administered medications for a least 24 hours  The patient is improving clinically and has a Tmax < 100.5  If you have questions about this conversion, please contact the Pharmacy Department  [x]  ( [phone] )  [place] []  ( [phone]  [place]  []  ( [phone] )  [facility] []  ( [phone] )  [facility]   S. Krystie Leiter, PharmD  

## 2014-03-12 NOTE — Progress Notes (Addendum)
Notified Dr. Juanetta GoslingHawkins for a couple of things: 1.  Patients asked to have her CBG checked this am and it was 295. 2.  Patient asked if she could have one more dose of Lyrica to be added to her regimen for an PRN dose. 3.  She wanted to go off the unit to go to the vending machine with her children in the w/c.   New orders given and followed.  Dr. Juanetta GoslingHawkins asked me to notifiy the patient of the fact that Lyrica doesn't work well as PRN.  He wants to keep the regimen previously ordered as ordered.

## 2014-03-12 NOTE — Care Management Utilization Note (Signed)
UR completed 

## 2014-03-12 NOTE — Care Management Note (Addendum)
    Page 1 of 1   03/14/2014     9:37:09 AM CARE MANAGEMENT NOTE 03/14/2014  Patient:  Joyce LusterBURNS,[name] A   Account Number:  [id number]  Date Initiated:  03/12/2014  Documentation initiated by:  Sharrie RothmanBLACKWELL,Tayveon Lombardo C  Subjective/Objective Assessment:   Pt admitted from home with pneumonia. Pt lives with her children and will return home at discharge. Pt is independent with ADL's. Pt has home O2 with [facility].     Action/Plan:   No CM needs noted.   Anticipated DC Date:  03/14/2014   Anticipated DC Plan:  HOME/SELF CARE      DC Planning Services  CM consult      Choice offered to / List presented to:             Status of service:  Completed, signed off Medicare Important Message given?   (If response is "NO", the following Medicare IM given date fields will be blank) Date Medicare IM given:   Date Additional Medicare IM given:    Discharge Disposition:  HOME/SELF CARE  Per UR Regulation:    If discussed at Long Length of Stay Meetings, dates discussed:    Comments:  03/14/14 0930 [name] [name], RN BSN CM Pt discharged home today. No CM needs noted.  03/12/14 1340 [name] [name], RN BSN CM

## 2014-03-12 NOTE — Clinical Documentation Improvement (Signed)
Presents with Pneumonia, COPD exacerbation, Acute Respiratory Failure.   WBC on admission was 26.1  Started on IV Rocephin and Zithromax  Please clarify if you feel: Sepsis ruled in; evolving or present on admission                                             Sepsis ruled out; no evidence of sepsis.   Thank You, Shellee MiloEileen T Seif Teichert ,RN Clinical Documentation Specialist:  [phone]  [facility]- Health Information Management

## 2014-03-12 NOTE — Progress Notes (Signed)
Subjective: She was admitted with pneumonia and says she's breathing better. She's not coughing much now. She was negative for influenza. She says she is "hyper" from steroids.  Objective: Vital signs in last 24 hours: Temp:  [96.9 F (36.1 C)-98.1 F (36.7 C)] 98.1 F (36.7 C) (04/20 0500) Pulse Rate:  [94-102] 94 (04/20 0806) Resp:  [18-20] 18 (04/20 0806) BP: (108-130)/(56-82) 108/56 mmHg (04/20 0500) SpO2:  [93 %-97 %] 96 % (04/20 0806) Weight change:  Last BM Date: 03/08/14 (pt states it is normal for her to go a few days without BM)  Intake/Output from previous day: 04/19 0701 - 04/20 0700 In: 840 [P.O.:840] Out: -   PHYSICAL EXAM General appearance: alert, cooperative and no distress Resp: rhonchi bilaterally Cardio: regular rate and rhythm, S1, S2 normal, no murmur, click, rub or gallop GI: soft, non-tender; bowel sounds normal; no masses,  no organomegaly Extremities: extremities normal, atraumatic, no cyanosis or edema  Lab Results:    Basic Metabolic Panel:  Recent Labs  16/10/96 0222  NA 137  K 3.7  CL 95*  CO2 30  GLUCOSE 216*  BUN 10  CREATININE 0.35*  CALCIUM 9.2   Liver Function Tests:  Recent Labs  03/11/14 0222  AST 15  ALT 9  ALKPHOS 75  BILITOT 0.6  PROT 7.5  ALBUMIN 3.3*   No results found for this basename: LIPASE, AMYLASE,  in the last 72 hours No results found for this basename: AMMONIA,  in the last 72 hours CBC:  Recent Labs  03/11/14 0222  WBC 26.1*  NEUTROABS 22.3*  HGB 12.1  HCT 36.6  MCV 85.5  PLT 302   Cardiac Enzymes: No results found for this basename: CKTOTAL, CKMB, CKMBINDEX, TROPONINI,  in the last 72 hours BNP: No results found for this basename: PROBNP,  in the last 72 hours D-Dimer: No results found for this basename: DDIMER,  in the last 72 hours CBG: No results found for this basename: GLUCAP,  in the last 72 hours Hemoglobin A1C: No results found for this basename: HGBA1C,  in the last 72  hours Fasting Lipid Panel: No results found for this basename: CHOL, HDL, LDLCALC, TRIG, CHOLHDL, LDLDIRECT,  in the last 72 hours Thyroid Function Tests: No results found for this basename: TSH, T4TOTAL, FREET4, T3FREE, THYROIDAB,  in the last 72 hours Anemia Panel: No results found for this basename: VITAMINB12, FOLATE, FERRITIN, TIBC, IRON, RETICCTPCT,  in the last 72 hours Coagulation: No results found for this basename: LABPROT, INR,  in the last 72 hours Urine Drug Screen: Drugs of Abuse     Component Value Date/Time   LABOPIA  Value: POSITIVE (NOTE) Sent for confirmatory testing Result repeated and verified.* 02/26/2011 2234   LABOPIA POSITIVE* 05/01/2010 1819   COCAINSCRNUR NEGATIVE 02/26/2011 2234   COCAINSCRNUR NONE DETECTED 05/01/2010 1819   LABBENZ  Value: POSITIVE QUANTITY NOT SUFFICIENT, UNABLE TO PERFORM TEST Unable to perform confirmation testing per [facility] (NOTE) Sent for confirmatory testing Result repeated and verified.* 02/26/2011 2234   LABBENZ POSITIVE* 05/01/2010 1819   AMPHETMU NEGATIVE 02/26/2011 2234   AMPHETMU NONE DETECTED 05/01/2010 1819   THCU POSITIVE* 05/01/2010 1819   LABBARB  Value: NONE DETECTED        DRUG SCREEN FOR MEDICAL PURPOSES ONLY.  IF CONFIRMATION IS NEEDED FOR ANY PURPOSE, NOTIFY LAB WITHIN 5 DAYS.        LOWEST DETECTABLE LIMITS FOR URINE DRUG SCREEN Drug Class  Cutoff (ng/mL) Amphetamine      1000 Barbiturate      200 Benzodiazepine   200 Tricyclics       300 Opiates          300 Cocaine          300 THC              50 05/01/2010 1819    Alcohol Level: No results found for this basename: ETH,  in the last 72 hours Urinalysis:  Recent Labs  03/11/14 0245  COLORURINE YELLOW  LABSPEC 1.025  PHURINE 6.5  GLUCOSEU NEGATIVE  HGBUR SMALL*  BILIRUBINUR NEGATIVE  KETONESUR 15*  PROTEINUR 30*  UROBILINOGEN 0.2  NITRITE NEGATIVE  LEUKOCYTESUR NEGATIVE   Misc. Labs:  ABGS No results found for this basename: PHART, PCO2, PO2ART, TCO2,  HCO3,  in the last 72 hours CULTURES Recent Results (from the past 240 hour(s))  CULTURE, BLOOD (ROUTINE X 2)     Status: None   Collection Time    03/11/14  2:21 AM      Result Value Ref Range Status   Specimen Description Blood BLOOD RIGHT HAND   Final   Special Requests BOTTLES DRAWN AEROBIC AND ANAEROBIC 6CC   Final   Culture NO GROWTH <24 HRS   Final   Report Status PENDING   Incomplete  CULTURE, BLOOD (ROUTINE X 2)     Status: None   Collection Time    03/11/14  2:27 AM      Result Value Ref Range Status   Specimen Description Blood RIGHT ANTECUBITAL   Final   Special Requests BOTTLES DRAWN AEROBIC AND ANAEROBIC 6CC   Final   Culture NO GROWTH <24 HRS   Final   Report Status PENDING   Incomplete  MRSA PCR SCREENING     Status: None   Collection Time    03/11/14  5:32 AM      Result Value Ref Range Status   MRSA by PCR NEGATIVE  NEGATIVE Final   Comment:            The GeneXpert MRSA Assay (FDA     approved for NASAL specimens     only), is one component of a     comprehensive MRSA colonization     surveillance program. It is not     intended to diagnose MRSA     infection nor to guide or     monitor treatment for     MRSA infections.   Studies/Results: Dg Chest 2 View  03/11/2014   CLINICAL DATA:  Shortness of breath since 10 p.m. Productive cough for 3 days.  EXAM: CHEST  2 VIEW  COMPARISON:  04/21/2013  FINDINGS: Developing patchy airspace disease in the right lung base suggesting developing pneumonia. Hyperinflation and interstitial changes probably underlying emphysema. No pleural effusion or pneumothorax. Normal heart size and pulmonary vascularity.  IMPRESSION: Developing patchy airspace disease in the right lung base on chronic emphysema.   Electronically Signed   By: Burman NievesWilliam  Stevens M.D.   On: 03/11/2014 02:44    Medications:  Prior to Admission:  Prescriptions prior to admission  Medication Sig Dispense Refill  . acetaminophen (TYLENOL) 500 MG tablet Take  500 mg by mouth every 6 (six) hours as needed for fever.       [name]. albuterol (PROAIR HFA) 108 (90 BASE) MCG/ACT inhaler Inhale 2 puffs into the lungs every 6 (six) hours as needed. For shortness of breATH      .  albuterol (PROVENTIL) (2.5 MG/3ML) 0.083% nebulizer solution Take 2.5 mg by nebulization every 4 (four) hours as needed. For shortness of breath       . budesonide-formoterol (SYMBICORT) 160-4.5 MCG/ACT inhaler Inhale 2 puffs into the lungs 2 (two) times daily.      . Diphenhyd-Hydrocort-Nystatin (FIRST-DUKES MOUTHWASH MT) Use as directed 5 mLs in the mouth or throat daily as needed. FOR THRUSH      . docusate sodium (COLACE) 100 MG capsule Take 200 mg by mouth every morning.      . fluticasone (FLONASE) 50 MCG/ACT nasal spray Place 2 sprays into the nose daily.      [name] lisinopril (PRINIVIL,ZESTRIL) 20 MG tablet Take 20 mg by mouth daily.      [name] LORazepam (ATIVAN) 0.5 MG tablet Take 1 tablet (0.5 mg total) by mouth every 6 (six) hours as needed for anxiety.  60 tablet  1  . methocarbamol (ROBAXIN) 500 MG tablet Take 500 mg by mouth 2 (two) times daily.      . OxyCODONE (OXYCONTIN) 10 mg T12A Take 1 tablet (10 mg total) by mouth every 12 (twelve) hours.  60 tablet  0  . oxyCODONE (ROXICODONE) 15 MG immediate release tablet Take 15 mg by mouth every 4 (four) hours as needed for pain.      [name] Oxymetazoline HCl (AFRIN NASAL SPRAY NA) Place 1 spray into the nose daily as needed (Congestion).       . pantoprazole (PROTONIX) 40 MG tablet Take 40 mg by mouth daily.      . pregabalin (LYRICA) 50 MG capsule Take 50 mg by mouth every 8 (eight) hours.       . promethazine (PHENERGAN) 25 MG tablet Take 25 mg by mouth every 6 (six) hours as needed. For nausea      . Simethicone (GAS-X EXTRA STRENGTH) 125 MG CAPS Take 2 capsules by mouth every morning.      . SUMAtriptan (IMITREX) 100 MG tablet Take 100 mg by mouth every 2 (two) hours as needed for migraine or headache. May repeat in 2 hours if headache  persists or recurs.      [name] tiotropium (SPIRIVA) 18 MCG inhalation capsule Place 18 mcg into inhaler and inhale daily.       Scheduled: . azithromycin  500 mg Intravenous Q24H  . cefTRIAXone (ROCEPHIN)  IV  1 g Intravenous Q24H  . docusate sodium  200 mg Oral q morning - 10a  . enoxaparin (LOVENOX) injection  40 mg Subcutaneous Q24H  . ipratropium-albuterol  3 mL Nebulization Q6H  . lisinopril  20 mg Oral Daily  . methocarbamol  500 mg Oral BID  . mometasone-formoterol  2 puff Inhalation BID  . OxyCODONE  10 mg Oral Q12H  . predniSONE  40 mg Oral Q breakfast  . pregabalin  50 mg Oral 3 times per day  . sodium chloride  3 mL Intravenous Q12H  . sodium chloride  3 mL Intravenous Q12H  . tiotropium  18 mcg Inhalation q morning - 10a   Continuous:  ZOX:WRUEAV chloride, sodium chloride, albuterol, guaiFENesin-dextromethorphan, LORazepam, ondansetron, oxyCODONE, sodium chloride, sodium chloride, SUMAtriptan  Assesment: She is admitted with community-acquired pneumonia. She is improving. This has caused her to have COPD exacerbation. She has diabetes which is doing okay. Principal Problem:   CAP (community acquired pneumonia) Active Problems:   TOBACCO ABUSE   DM (diabetes mellitus)   COPD exacerbation   Community acquired pneumonia    Plan: Continue current treatments. She  looks better.    LOS: 1 day   Fredirick Maudlindward L Burk Hoctor 03/12/2014, 8:37 AM

## 2014-03-12 NOTE — Progress Notes (Signed)
I have discussed with this patient that she should not be leaving the unit to smoke.  Especially since she has on oxygen that she has to use for hypoxia.  She verbalized understanding, yet was seen on the corner smoking with her oxygen on.  Security was called to escort the patient back to the room.  Dr. Ouida SillsFagan was notified that I was going to d/c the order for the patient to the floor.  He verbalized understanding and I will pass my efforts on to [facility] Center[facility]andi the oncoming nurse.

## 2014-03-13 DIAGNOSIS — J9620 Acute and chronic respiratory failure, unspecified whether with hypoxia or hypercapnia: Secondary | ICD-10-CM | POA: Diagnosis present

## 2014-03-13 LAB — HEMOGLOBIN A1C
HEMOGLOBIN A1C: 6.9 % — AB (ref <5.7)
MEAN PLASMA GLUCOSE: 151 mg/dL — AB (ref <117)

## 2014-03-13 LAB — GLUCOSE, CAPILLARY
GLUCOSE-CAPILLARY: 109 mg/dL — AB (ref 70–99)
Glucose-Capillary: 293 mg/dL — ABNORMAL HIGH (ref 70–99)
Glucose-Capillary: 358 mg/dL — ABNORMAL HIGH (ref 70–99)
Glucose-Capillary: 150 mg/dL — ABNORMAL HIGH (ref 70–99)

## 2014-03-13 NOTE — Progress Notes (Signed)
Inpatient Diabetes Program Recommendations  AACE/ADA: New Consensus Statement on Inpatient Glycemic Control (2013)  Target Ranges:  Prepandial:   less than 140 mg/dL      Peak postprandial:   less than 180 mg/dL (1-2 hours)      Critically ill patients:  140 - 180 mg/dL  Results for Herzberg, Alvita A (MRN [id number]) as of 03/13/2014 07:49  Ref. Range 03/11/2014 02:22  Hemoglobin A1C Latest Range: <5.7 % 6.9 (H)   Results for Beil, Ria ClockAMMY A (MRN [id number]) as of 03/13/2014 07:49  Ref. Range 03/12/2014 08:42 03/12/2014 13:58 03/12/2014 17:14 03/12/2014 21:23 03/13/2014 07:22  Glucose-Capillary Latest Range: 70-99 mg/dL 102295 (H) 725270 (H) 366129 (H) 224 (H) 293 (H)   Diabetes history: DM2 Outpatient Diabetes medications: None Current orders for Inpatient glycemic control: Novolog 0-15 units AC  Inpatient Diabetes Program Recommendations Insulin - Basal: Please consider ordering low dose basal insulin; recommend starting with Levemir 7 units daily.  Note: Patient has a history of diabetes but does not take any medications as an outpatient.  Noted patient is ordered steroids which is contributing to hyperglycemia. Please consider ordering low dose basal insulin; recommend starting with Levemir 7 units daily. Will continue to follow.  Thanks, Orlando PennerMarie Jalan Fariss, RN, MSN, CCRN Diabetes Coordinator Inpatient Diabetes Program [phone] (Team Pager) [phone] (AP office) [phone] [facility] office)

## 2014-03-13 NOTE — Progress Notes (Signed)
Subjective: She feels much better. She is less congested. Since she was switched to oral steroids she has much less agitation. Her blood sugar has been up and her hemoglobin A1c was 6.9 on admission so she clearly does have a problem with diabetes that may be steroid-induced  Objective: Vital signs in last 24 hours: Temp:  [97.7 F (36.5 C)-98.5 F (36.9 C)] 97.7 F (36.5 C) (04/21 0443) Pulse Rate:  [83-99] 83 (04/21 0443) Resp:  [16-20] 18 (04/21 0443) BP: (116-146)/(58-92) 116/58 mmHg (04/21 0443) SpO2:  [97 %-98 %] 97 % (04/21 0443) Weight change:  Last BM Date: 03/08/14 (pt states it is normal for her to go a few days without BM)  Intake/Output from previous day: 04/20 0701 - 04/21 0700 In: 410 [P.O.:360; IV Piggyback:50] Out: 600 [Urine:600]  PHYSICAL EXAM General appearance: alert, cooperative and no distress Resp: rhonchi bilaterally and wheezes bilaterally Cardio: regular rate and rhythm, S1, S2 normal, no murmur, click, rub or gallop GI: soft, non-tender; bowel sounds normal; no masses,  no organomegaly Extremities: extremities normal, atraumatic, no cyanosis or edema  Lab Results:    Basic Metabolic Panel:  Recent Labs  16/08/9603/19/15 0222  NA 137  K 3.7  CL 95*  CO2 30  GLUCOSE 216*  BUN 10  CREATININE 0.35*  CALCIUM 9.2   Liver Function Tests:  Recent Labs  03/11/14 0222  AST 15  ALT 9  ALKPHOS 75  BILITOT 0.6  PROT 7.5  ALBUMIN 3.3*   No results found for this basename: LIPASE, AMYLASE,  in the last 72 hours No results found for this basename: AMMONIA,  in the last 72 hours CBC:  Recent Labs  03/11/14 0222  WBC 26.1*  NEUTROABS 22.3*  HGB 12.1  HCT 36.6  MCV 85.5  PLT 302   Cardiac Enzymes: No results found for this basename: CKTOTAL, CKMB, CKMBINDEX, TROPONINI,  in the last 72 hours BNP: No results found for this basename: PROBNP,  in the last 72 hours D-Dimer: No results found for this basename: DDIMER,  in the last 72  hours CBG:  Recent Labs  03/12/14 0842 03/12/14 1358 03/12/14 1714 03/12/14 2123 03/13/14 0722  GLUCAP 295* 270* 129* 224* 293*   Hemoglobin A1C:  Recent Labs  03/11/14 0222  HGBA1C 6.9*   Fasting Lipid Panel: No results found for this basename: CHOL, HDL, LDLCALC, TRIG, CHOLHDL, LDLDIRECT,  in the last 72 hours Thyroid Function Tests: No results found for this basename: TSH, T4TOTAL, FREET4, T3FREE, THYROIDAB,  in the last 72 hours Anemia Panel: No results found for this basename: VITAMINB12, FOLATE, FERRITIN, TIBC, IRON, RETICCTPCT,  in the last 72 hours Coagulation: No results found for this basename: LABPROT, INR,  in the last 72 hours Urine Drug Screen: Drugs of Abuse     Component Value Date/Time   LABOPIA  Value: POSITIVE (NOTE) Sent for confirmatory testing Result repeated and verified.* 02/26/2011 2234   LABOPIA POSITIVE* 05/01/2010 1819   COCAINSCRNUR NEGATIVE 02/26/2011 2234   COCAINSCRNUR NONE DETECTED 05/01/2010 1819   LABBENZ  Value: POSITIVE QUANTITY NOT SUFFICIENT, UNABLE TO PERFORM TEST Unable to perform confirmation testing per [facility] (NOTE) Sent for confirmatory testing Result repeated and verified.* 02/26/2011 2234   LABBENZ POSITIVE* 05/01/2010 1819   AMPHETMU NEGATIVE 02/26/2011 2234   AMPHETMU NONE DETECTED 05/01/2010 1819   THCU POSITIVE* 05/01/2010 1819   LABBARB  Value: NONE DETECTED        DRUG SCREEN FOR MEDICAL PURPOSES ONLY.  IF  CONFIRMATION IS NEEDED FOR ANY PURPOSE, NOTIFY LAB WITHIN 5 DAYS.        LOWEST DETECTABLE LIMITS FOR URINE DRUG SCREEN Drug Class       Cutoff (ng/mL) Amphetamine      1000 Barbiturate      200 Benzodiazepine   200 Tricyclics       300 Opiates          300 Cocaine          300 THC              50 05/01/2010 1819    Alcohol Level: No results found for this basename: ETH,  in the last 72 hours Urinalysis:  Recent Labs  03/11/14 0245  COLORURINE YELLOW  LABSPEC 1.025  PHURINE 6.5  GLUCOSEU NEGATIVE  HGBUR SMALL*   BILIRUBINUR NEGATIVE  KETONESUR 15*  PROTEINUR 30*  UROBILINOGEN 0.2  NITRITE NEGATIVE  LEUKOCYTESUR NEGATIVE   Misc. Labs:  ABGS No results found for this basename: PHART, PCO2, PO2ART, TCO2, HCO3,  in the last 72 hours CULTURES Recent Results (from the past 240 hour(s))  CULTURE, BLOOD (ROUTINE X 2)     Status: None   Collection Time    03/11/14  2:21 AM      Result Value Ref Range Status   Specimen Description Blood BLOOD RIGHT HAND   Final   Special Requests BOTTLES DRAWN AEROBIC AND ANAEROBIC 6CC   Final   Culture NO GROWTH 1 DAY   Final   Report Status PENDING   Incomplete  CULTURE, BLOOD (ROUTINE X 2)     Status: None   Collection Time    03/11/14  2:27 AM      Result Value Ref Range Status   Specimen Description Blood RIGHT ANTECUBITAL   Final   Special Requests BOTTLES DRAWN AEROBIC AND ANAEROBIC 6CC   Final   Culture NO GROWTH 1 DAY   Final   Report Status PENDING   Incomplete  URINE CULTURE     Status: None   Collection Time    03/11/14  2:48 AM      Result Value Ref Range Status   Specimen Description URINE, CLEAN CATCH   Final   Special Requests NONE   Final   Culture  Setup Time     Final   Value: 03/11/2014 20:14     Performed at [facility]   Colony Count     Final   Value: NO GROWTH     [facility]   Culture     Final   Value: NO GROWTH     Performed at [facility]   Report Status 03/12/2014 FINAL   Final  MRSA PCR SCREENING     Status: None   Collection Time    03/11/14  5:32 AM      Result Value Ref Range Status   MRSA by PCR NEGATIVE  NEGATIVE Final   Comment:            The GeneXpert MRSA Assay (FDA     approved for NASAL specimens     only), is one component of a     comprehensive MRSA colonization     surveillance program. It is not     intended to diagnose MRSA     infection nor to guide or     monitor treatment for     MRSA infections.   Studies/Results: No results found.  Medications:   Prior  to Admission:  Prescriptions prior to admission  Medication Sig Dispense Refill  . acetaminophen (TYLENOL) 500 MG tablet Take 500 mg by mouth every 6 (six) hours as needed for fever.       [name] albuterol (PROAIR HFA) 108 (90 BASE) MCG/ACT inhaler Inhale 2 puffs into the lungs every 6 (six) hours as needed. For shortness of breATH      . albuterol (PROVENTIL) (2.5 MG/3ML) 0.083% nebulizer solution Take 2.5 mg by nebulization every 4 (four) hours as needed. For shortness of breath       . budesonide-formoterol (SYMBICORT) 160-4.5 MCG/ACT inhaler Inhale 2 puffs into the lungs 2 (two) times daily.      . Diphenhyd-Hydrocort-Nystatin (FIRST-DUKES MOUTHWASH MT) Use as directed 5 mLs in the mouth or throat daily as needed. FOR THRUSH      . docusate sodium (COLACE) 100 MG capsule Take 200 mg by mouth every morning.      . fluticasone (FLONASE) 50 MCG/ACT nasal spray Place 2 sprays into the nose daily.      [name] lisinopril (PRINIVIL,ZESTRIL) 20 MG tablet Take 20 mg by mouth daily.      [name] LORazepam (ATIVAN) 0.5 MG tablet Take 1 tablet (0.5 mg total) by mouth every 6 (six) hours as needed for anxiety.  60 tablet  1  . methocarbamol (ROBAXIN) 500 MG tablet Take 500 mg by mouth 2 (two) times daily.      . OxyCODONE (OXYCONTIN) 10 mg T12A Take 1 tablet (10 mg total) by mouth every 12 (twelve) hours.  60 tablet  0  . oxyCODONE (ROXICODONE) 15 MG immediate release tablet Take 15 mg by mouth every 4 (four) hours as needed for pain.      [name] Oxymetazoline HCl (AFRIN NASAL SPRAY NA) Place 1 spray into the nose daily as needed (Congestion).       . pantoprazole (PROTONIX) 40 MG tablet Take 40 mg by mouth daily.      . pregabalin (LYRICA) 50 MG capsule Take 50 mg by mouth every 8 (eight) hours.       . promethazine (PHENERGAN) 25 MG tablet Take 25 mg by mouth every 6 (six) hours as needed. For nausea      . Simethicone (GAS-X EXTRA STRENGTH) 125 MG CAPS Take 2 capsules by mouth every morning.      . SUMAtriptan  (IMITREX) 100 MG tablet Take 100 mg by mouth every 2 (two) hours as needed for migraine or headache. May repeat in 2 hours if headache persists or recurs.      [name] tiotropium (SPIRIVA) 18 MCG inhalation capsule Place 18 mcg into inhaler and inhale daily.       Scheduled: . azithromycin  500 mg Oral q1800  . cefTRIAXone (ROCEPHIN)  IV  1 g Intravenous Q24H  . docusate sodium  200 mg Oral q morning - 10a  . enoxaparin (LOVENOX) injection  40 mg Subcutaneous Q24H  . insulin aspart  0-15 Units Subcutaneous TID WC  . ipratropium-albuterol  3 mL Nebulization Q6H  . lisinopril  20 mg Oral Daily  . methocarbamol  500 mg Oral BID  . mometasone-formoterol  2 puff Inhalation BID  . OxyCODONE  10 mg Oral Q12H  . predniSONE  40 mg Oral Q breakfast  . pregabalin  50 mg Oral 3 times per day  . sodium chloride  3 mL Intravenous Q12H  . sodium chloride  3 mL Intravenous Q12H  . tiotropium  18 mcg Inhalation q morning - 10a  Continuous:  AVW:[id number] chloride, sodium chloride, albuterol, guaiFENesin-dextromethorphan, LORazepam, ondansetron, oxyCODONE, sodium chloride, sodium chloride, SUMAtriptan  Assesment: She was admitted with community-acquired pneumonia. Although her white blood cell count was high I do not believe she was septic on admission. She has acute on chronic respiratory failure. She has COPD exacerbation. She has diabetes which is worse since she's been on steroids but she is overall much improved Principal Problem:   CAP (community acquired pneumonia) Active Problems:   TOBACCO ABUSE   DM (diabetes mellitus)   COPD exacerbation   Community acquired pneumonia    Plan: Continue treatments and probably discharge tomorrow    LOS: 2 days   Fredirick Maudlin 03/13/2014, 8:43 AM

## 2014-03-14 LAB — BASIC METABOLIC PANEL
BUN: 7 mg/dL (ref 6–23)
CHLORIDE: 99 meq/L (ref 96–112)
CO2: 33 mEq/L — ABNORMAL HIGH (ref 19–32)
Calcium: 8.9 mg/dL (ref 8.4–10.5)
Creatinine, Ser: 0.39 mg/dL — ABNORMAL LOW (ref 0.50–1.10)
GFR calc non Af Amer: >90 mL/min (ref >90)
Glucose, Bld: 159 mg/dL — ABNORMAL HIGH (ref 70–99)
POTASSIUM: 3 meq/L — AB (ref 3.7–5.3)
Sodium: 142 mEq/L (ref 137–147)

## 2014-03-14 LAB — GLUCOSE, CAPILLARY: Glucose-Capillary: 194 mg/dL — ABNORMAL HIGH (ref 70–99)

## 2014-03-14 MED ORDER — POTASSIUM CHLORIDE CRYS ER 20 MEQ PO TBCR
40.0000 meq | EXTENDED_RELEASE_TABLET | Freq: Once | ORAL | Status: AC
  Administered 2014-03-14: 40 meq via ORAL
  Filled 2014-03-14: qty 2

## 2014-03-14 MED ORDER — METHYLPREDNISOLONE (PAK) 4 MG PO TABS: ORAL_TABLET | ORAL | Status: DC

## 2014-03-14 MED ORDER — FLUCONAZOLE 100 MG PO TABS: 100.0000 mg | ORAL_TABLET | Freq: Every day | ORAL | Status: DC

## 2014-03-14 MED ORDER — CEPHALEXIN 500 MG PO CAPS
500.0000 mg | ORAL_CAPSULE | Freq: Three times a day (TID) | ORAL | Status: DC
Start: 2014-03-14 — End: 2014-07-24

## 2014-03-14 MED ORDER — AZITHROMYCIN 250 MG PO TABS: ORAL_TABLET | ORAL | Status: AC

## 2014-03-14 NOTE — Progress Notes (Signed)
Subjective: She says she feels well and wants to go home  Objective: Vital signs in last 24 hours: Temp:  [97.5 F (36.4 C)-98.2 F (36.8 C)] 98.1 F (36.7 C) (04/22 0415) Pulse Rate:  [76-91] 76 (04/22 0415) Resp:  [18-20] 18 (04/22 0415) BP: (111-145)/(59-87) 145/87 mmHg (04/22 0815) SpO2:  [95 %-100 %] 98 % (04/22 0718) Weight change:  Last BM Date: 03/08/14 (pt states it is normal for her to go a few days without BM)  Intake/Output from previous day: 04/21 0701 - 04/22 0700 In: 920 [P.O.:920] Out: 350 [Urine:350]  PHYSICAL EXAM General appearance: alert, cooperative and no distress Resp: clear to auscultation bilaterally Cardio: regular rate and rhythm, S1, S2 normal, no murmur, click, rub or gallop GI: soft, non-tender; bowel sounds normal; no masses,  no organomegaly Extremities: extremities normal, atraumatic, no cyanosis or edema  Lab Results:    Basic Metabolic Panel:  Recent Labs  16/10/96 0458  NA 142  K 3.0*  CL 99  CO2 33*  GLUCOSE 159*  BUN 7  CREATININE 0.39*  CALCIUM 8.9   Liver Function Tests: No results found for this basename: AST, ALT, ALKPHOS, BILITOT, PROT, ALBUMIN,  in the last 72 hours No results found for this basename: LIPASE, AMYLASE,  in the last 72 hours No results found for this basename: AMMONIA,  in the last 72 hours CBC: No results found for this basename: WBC, NEUTROABS, HGB, HCT, MCV, PLT,  in the last 72 hours Cardiac Enzymes: No results found for this basename: CKTOTAL, CKMB, CKMBINDEX, TROPONINI,  in the last 72 hours BNP: No results found for this basename: PROBNP,  in the last 72 hours D-Dimer: No results found for this basename: DDIMER,  in the last 72 hours CBG:  Recent Labs  03/12/14 2123 03/13/14 0722 03/13/14 1107 03/13/14 1626 03/13/14 2132 03/14/14 0711  GLUCAP 224* 293* 109* 358* 150* 194*   Hemoglobin A1C: No results found for this basename: HGBA1C,  in the last 72 hours Fasting Lipid Panel: No  results found for this basename: CHOL, HDL, LDLCALC, TRIG, CHOLHDL, LDLDIRECT,  in the last 72 hours Thyroid Function Tests: No results found for this basename: TSH, T4TOTAL, FREET4, T3FREE, THYROIDAB,  in the last 72 hours Anemia Panel: No results found for this basename: VITAMINB12, FOLATE, FERRITIN, TIBC, IRON, RETICCTPCT,  in the last 72 hours Coagulation: No results found for this basename: LABPROT, INR,  in the last 72 hours Urine Drug Screen: Drugs of Abuse     Component Value Date/Time   LABOPIA  Value: POSITIVE (NOTE) Sent for confirmatory testing Result repeated and verified.* 02/26/2011 2234   LABOPIA POSITIVE* 05/01/2010 1819   COCAINSCRNUR NEGATIVE 02/26/2011 2234   COCAINSCRNUR NONE DETECTED 05/01/2010 1819   LABBENZ  Value: POSITIVE QUANTITY NOT SUFFICIENT, UNABLE TO PERFORM TEST Unable to perform confirmation testing per [facility] (NOTE) Sent for confirmatory testing Result repeated and verified.* 02/26/2011 2234   LABBENZ POSITIVE* 05/01/2010 1819   AMPHETMU NEGATIVE 02/26/2011 2234   AMPHETMU NONE DETECTED 05/01/2010 1819   THCU POSITIVE* 05/01/2010 1819   LABBARB  Value: NONE DETECTED        DRUG SCREEN FOR MEDICAL PURPOSES ONLY.  IF CONFIRMATION IS NEEDED FOR ANY PURPOSE, NOTIFY LAB WITHIN 5 DAYS.        LOWEST DETECTABLE LIMITS FOR URINE DRUG SCREEN Drug Class       Cutoff (ng/mL) Amphetamine      1000 Barbiturate      200 Benzodiazepine  200 Tricyclics       300 Opiates          300 Cocaine          300 THC              50 05/01/2010 1819    Alcohol Level: No results found for this basename: ETH,  in the last 72 hours Urinalysis: No results found for this basename: COLORURINE, APPERANCEUR, LABSPEC, PHURINE, GLUCOSEU, HGBUR, BILIRUBINUR, KETONESUR, PROTEINUR, UROBILINOGEN, NITRITE, LEUKOCYTESUR,  in the last 72 hours Misc. Labs:  ABGS No results found for this basename: PHART, PCO2, PO2ART, TCO2, HCO3,  in the last 72 hours CULTURES Recent Results (from the past 240  hour(s))  CULTURE, BLOOD (ROUTINE X 2)     Status: None   Collection Time    03/11/14  2:21 AM      Result Value Ref Range Status   Specimen Description BLOOD RIGHT HAND   Final   Special Requests BOTTLES DRAWN AEROBIC AND ANAEROBIC 6CC   Final   Culture NO GROWTH 2 DAYS   Final   Report Status PENDING   Incomplete  CULTURE, BLOOD (ROUTINE X 2)     Status: None   Collection Time    03/11/14  2:27 AM      Result Value Ref Range Status   Specimen Description BLOOD RIGHT ANTECUBITAL   Final   Special Requests BOTTLES DRAWN AEROBIC AND ANAEROBIC 6CC   Final   Culture NO GROWTH 2 DAYS   Final   Report Status PENDING   Incomplete  URINE CULTURE     Status: None   Collection Time    03/11/14  2:48 AM      Result Value Ref Range Status   Specimen Description URINE, CLEAN CATCH   Final   Special Requests NONE   Final   Culture  Setup Time     Final   Value: 03/11/2014 20:14     Performed at [facility]   Colony Count     Final   Value: NO GROWTH     [facility]formed at [facility]   Culture     Final   Value: NO GROWTH     Performed at [facility]   Report Status 03/12/2014 FINAL   Final  MRSA PCR SCREENING     Status: None   Collection Time    03/11/14  5:32 AM      Result Value Ref Range Status   MRSA by PCR NEGATIVE  NEGATIVE Final   Comment:            The GeneXpert MRSA Assay (FDA     approved for NASAL specimens     only), is one component of a     comprehensive MRSA colonization     surveillance program. It is not     intended to diagnose MRSA     infection nor to guide or     monitor treatment for     MRSA infections.   Studies/Results: No results found.  Medications:  Prior to Admission:  Prescriptions prior to admission  Medication Sig Dispense Refill  . acetaminophen (TYLENOL) 500 MG tablet Take 500 mg by mouth every 6 (six) hours as needed for fever.       [name]. albuterol (PROAIR HFA) 108 (90 BASE) MCG/ACT inhaler Inhale 2 puffs into the  lungs every 6 (six) hours as needed. For shortness of breATH      . albuterol (  PROVENTIL) (2.5 MG/3ML) 0.083% nebulizer solution Take 2.5 mg by nebulization every 4 (four) hours as needed. For shortness of breath       . budesonide-formoterol (SYMBICORT) 160-4.5 MCG/ACT inhaler Inhale 2 puffs into the lungs 2 (two) times daily.      . Diphenhyd-Hydrocort-Nystatin (FIRST-DUKES MOUTHWASH MT) Use as directed 5 mLs in the mouth or throat daily as needed. FOR THRUSH      . docusate sodium (COLACE) 100 MG capsule Take 200 mg by mouth every morning.      . fluticasone (FLONASE) 50 MCG/ACT nasal spray Place 2 sprays into the nose daily.      [name]. lisinopril (PRINIVIL,ZESTRIL) 20 MG tablet Take 20 mg by mouth daily.      [name]. LORazepam (ATIVAN) 0.5 MG tablet Take 1 tablet (0.5 mg total) by mouth every 6 (six) hours as needed for anxiety.  60 tablet  1  . methocarbamol (ROBAXIN) 500 MG tablet Take 500 mg by mouth 2 (two) times daily.      . OxyCODONE (OXYCONTIN) 10 mg T12A Take 1 tablet (10 mg total) by mouth every 12 (twelve) hours.  60 tablet  0  . oxyCODONE (ROXICODONE) 15 MG immediate release tablet Take 15 mg by mouth every 4 (four) hours as needed for pain.      [name]. Oxymetazoline HCl (AFRIN NASAL SPRAY NA) Place 1 spray into the nose daily as needed (Congestion).       . pantoprazole (PROTONIX) 40 MG tablet Take 40 mg by mouth daily.      . pregabalin (LYRICA) 50 MG capsule Take 50 mg by mouth every 8 (eight) hours.       . promethazine (PHENERGAN) 25 MG tablet Take 25 mg by mouth every 6 (six) hours as needed. For nausea      . Simethicone (GAS-X EXTRA STRENGTH) 125 MG CAPS Take 2 capsules by mouth every morning.      . SUMAtriptan (IMITREX) 100 MG tablet Take 100 mg by mouth every 2 (two) hours as needed for migraine or headache. May repeat in 2 hours if headache persists or recurs.      [name]. tiotropium (SPIRIVA) 18 MCG inhalation capsule Place 18 mcg into inhaler and inhale daily.       Scheduled: .  azithromycin  500 mg Oral q1800  . cefTRIAXone (ROCEPHIN)  IV  1 g Intravenous Q24H  . docusate sodium  200 mg Oral q morning - 10a  . enoxaparin (LOVENOX) injection  40 mg Subcutaneous Q24H  . insulin aspart  0-15 Units Subcutaneous TID WC  . ipratropium-albuterol  3 mL Nebulization Q6H  . lisinopril  20 mg Oral Daily  . methocarbamol  500 mg Oral BID  . mometasone-formoterol  2 puff Inhalation BID  . OxyCODONE  10 mg Oral Q12H  . predniSONE  40 mg Oral Q breakfast  . pregabalin  50 mg Oral 3 times per day  . sodium chloride  3 mL Intravenous Q12H  . sodium chloride  3 mL Intravenous Q12H  . tiotropium  18 mcg Inhalation q morning - 10a   Continuous:  ZOX:[id number]:sodium chloride, sodium chloride, albuterol, guaiFENesin-dextromethorphan, LORazepam, ondansetron, oxyCODONE, sodium chloride, sodium chloride, SUMAtriptan  Assesment: She was admitted with community-acquired pneumonia and acute on chronic respiratory failure. She has severe COPD. She has diabetes which is been pretty well controlled. She has ongoing tobacco abuse and was strongly advised to quit she is much improved and ready for discharge Principal Problem:   CAP (community acquired  pneumonia) Active Problems:   TOBACCO ABUSE   DM (diabetes mellitus)   COPD exacerbation   Community acquired pneumonia   Acute-on-chronic respiratory failure    Plan: Discharge today    LOS: 3 days   Fredirick Maudlin 03/14/2014, 8:52 AM

## 2014-03-14 NOTE — Discharge Summary (Signed)
Physician Discharge Summary  Patient ID: [name] MRN: [id number] DOB/AGE: 46/06/1966 46 y.o. Primary Care Physician:Pryce Folts L, MD Admit date: 03/11/2014 Discharge date: 03/14/2014    Discharge Diagnoses:   Principal Problem:   CAP (community acquired pneumonia) Active Problems:   TOBACCO ABUSE   DM (diabetes mellitus)   COPD exacerbation   Community acquired pneumonia   Acute-on-chronic respiratory failure  chronic pain    Medication List         acetaminophen 500 MG tablet  Commonly known as:  TYLENOL  Take 500 mg by mouth every 6 (six) hours as needed for fever.     AFRIN NASAL SPRAY NA  Place 1 spray into the nose daily as needed (Congestion).     albuterol (2.5 MG/3ML) 0.083% nebulizer solution  Commonly known as:  PROVENTIL  Take 2.5 mg by nebulization every 4 (four) hours as needed. For shortness of breath     PROAIR HFA 108 (90 BASE) MCG/ACT inhaler  Generic drug:  albuterol  Inhale 2 puffs into the lungs every 6 (six) hours as needed. For shortness of breATH     azithromycin 250 MG tablet  Commonly known as:  ZITHROMAX Z-PAK  Take 2 tablets (500 mg) on  Day 1,  followed by 1 tablet (250 mg) once daily on Days 2 through 5.     budesonide-formoterol 160-4.5 MCG/ACT inhaler  Commonly known as:  SYMBICORT  Inhale 2 puffs into the lungs 2 (two) times daily.     cephALEXin 500 MG capsule  Commonly known as:  KEFLEX  Take 1 capsule (500 mg total) by mouth 3 (three) times daily.     docusate sodium 100 MG capsule  Commonly known as:  COLACE  Take 200 mg by mouth every morning.     FIRST-DUKES MOUTHWASH MT  Use as directed 5 mLs in the mouth or throat daily as needed. FOR THRUSH     fluconazole 100 MG tablet  Commonly known as:  DIFLUCAN  Take 1 tablet (100 mg total) by mouth daily.     fluticasone 50 MCG/ACT nasal spray  Commonly known as:  FLONASE  Place 2 sprays into the nose daily.     GAS-X EXTRA STRENGTH 125 MG Caps  Generic drug:   Simethicone  Take 2 capsules by mouth every morning.     lisinopril 20 MG tablet  Commonly known as:  PRINIVIL,ZESTRIL  Take 20 mg by mouth daily.     LORazepam 0.5 MG tablet  Commonly known as:  ATIVAN  Take 1 tablet (0.5 mg total) by mouth every 6 (six) hours as needed for anxiety.     methocarbamol 500 MG tablet  Commonly known as:  ROBAXIN  Take 500 mg by mouth 2 (two) times daily.     methylPREDNIsolone 4 MG tablet  Commonly known as:  MEDROL DOSPACK  follow package directions     OxyCODONE 10 mg T12a 12 hr tablet  Commonly known as:  OXYCONTIN  Take 1 tablet (10 mg total) by mouth every 12 (twelve) hours.     oxyCODONE 15 MG immediate release tablet  Commonly known as:  ROXICODONE  Take 15 mg by mouth every 4 (four) hours as needed for pain.     pantoprazole 40 MG tablet  Commonly known as:  PROTONIX  Take 40 mg by mouth daily.     pregabalin 50 MG capsule  Commonly known as:  LYRICA  Take 50 mg by mouth every 8 (eight) hours.  promethazine 25 MG tablet  Commonly known as:  PHENERGAN  Take 25 mg by mouth every 6 (six) hours as needed. For nausea     SUMAtriptan 100 MG tablet  Commonly known as:  IMITREX  Take 100 mg by mouth every 2 (two) hours as needed for migraine or headache. May repeat in 2 hours if headache persists or recurs.     tiotropium 18 MCG inhalation capsule  Commonly known as:  SPIRIVA  Place 18 mcg into inhaler and inhale daily.        Discharged Condition: Improved    Consults: None  Significant Diagnostic Studies: Dg Chest 2 View  03/11/2014   CLINICAL DATA:  Shortness of breath since 10 p.m. Productive cough for 3 days.  EXAM: CHEST  2 VIEW  COMPARISON:  04/21/2013  FINDINGS: Developing patchy airspace disease in the right lung base suggesting developing pneumonia. Hyperinflation and interstitial changes probably underlying emphysema. No pleural effusion or pneumothorax. Normal heart size and pulmonary vascularity.  IMPRESSION:  Developing patchy airspace disease in the right lung base on chronic emphysema.   Electronically Signed   By: Burman Nieves M.D.   On: 03/11/2014 02:44    Lab Results: Basic Metabolic Panel:  Recent Labs  29/56/21 0458  NA 142  K 3.0*  CL 99  CO2 33*  GLUCOSE 159*  BUN 7  CREATININE 0.39*  CALCIUM 8.9   Liver Function Tests: No results found for this basename: AST, ALT, ALKPHOS, BILITOT, PROT, ALBUMIN,  in the last 72 hours   CBC: No results found for this basename: WBC, NEUTROABS, HGB, HCT, MCV, PLT,  in the last 72 hours  Recent Results (from the past 240 hour(s))  CULTURE, BLOOD (ROUTINE X 2)     Status: None   Collection Time    03/11/14  2:21 AM      Result Value Ref Range Status   Specimen Description BLOOD RIGHT HAND   Final   Special Requests BOTTLES DRAWN AEROBIC AND ANAEROBIC 6CC   Final   Culture NO GROWTH 2 DAYS   Final   Report Status PENDING   Incomplete  CULTURE, BLOOD (ROUTINE X 2)     Status: None   Collection Time    03/11/14  2:27 AM      Result Value Ref Range Status   Specimen Description BLOOD RIGHT ANTECUBITAL   Final   Special Requests BOTTLES DRAWN AEROBIC AND ANAEROBIC 6CC   Final   Culture NO GROWTH 2 DAYS   Final   Report Status PENDING   Incomplete  URINE CULTURE     Status: None   Collection Time    03/11/14  2:48 AM      Result Value Ref Range Status   Specimen Description URINE, CLEAN CATCH   Final   Special Requests NONE   Final   Culture  Setup Time     Final   Value: 03/11/2014 20:14     Performed at [facility] Count     Final   Value: NO GROWTH     Performed at [facility]   Culture     Final   Value: NO GROWTH     Performed at [facility]   Report Status 03/12/2014 FINAL   Final  MRSA PCR SCREENING     Status: None   Collection Time    03/11/14  5:32 AM      Result Value Ref Range Status  MRSA by PCR NEGATIVE  NEGATIVE Final   Comment:            The GeneXpert MRSA Assay (FDA      approved for NASAL specimens     only), is one component of a     comprehensive MRSA colonization     surveillance program. It is not     intended to diagnose MRSA     infection nor to guide or     monitor treatment for     MRSA infections.     Hospital Course: She was admitted with pneumonia. She came to the emergency room because of cough and congestion. She was treated with intravenous antibiotics and IV steroids. Her blood sugar became quite elevated. Hemoglobin A1c was 6.9 so she does have elevated blood sugar at baseline but she's been on an off of steroids. She improved rapidly and by 03/14/2014 was back at baseline. Her chest was clear and she was ready for discharge  Discharge Exam: Blood pressure 145/87, pulse 76, temperature 98.1 F (36.7 C), temperature source Oral, resp. rate 18, height 5\' 1"  (1.549 m), weight 68.947 kg (152 lb), SpO2 98.00%. Her chest is clear. Her heart is regular. Abdomen is soft.  Disposition: Discharge home. We discussed home health but she does not feel that she needs that      Signed: Fredirick MaudlinEdward L Barnie Sopko   03/14/2014, 8:54 AM

## 2014-03-14 NOTE — Progress Notes (Signed)
Patient states understanding of discharge instructions, prescription given. 

## 2014-03-17 LAB — CULTURE, BLOOD (ROUTINE X 2)
CULTURE: NO GROWTH
CULTURE: NO GROWTH

## 2014-05-06 IMAGING — CR DG CHEST 2V
2 series · 2 of 2 positions shown · non-contrast
Comparison: 11/30/2012

CLINICAL DATA: Cough, respiratory infection

CHEST - 2 VIEW

[view not recorded (1 of 2)]
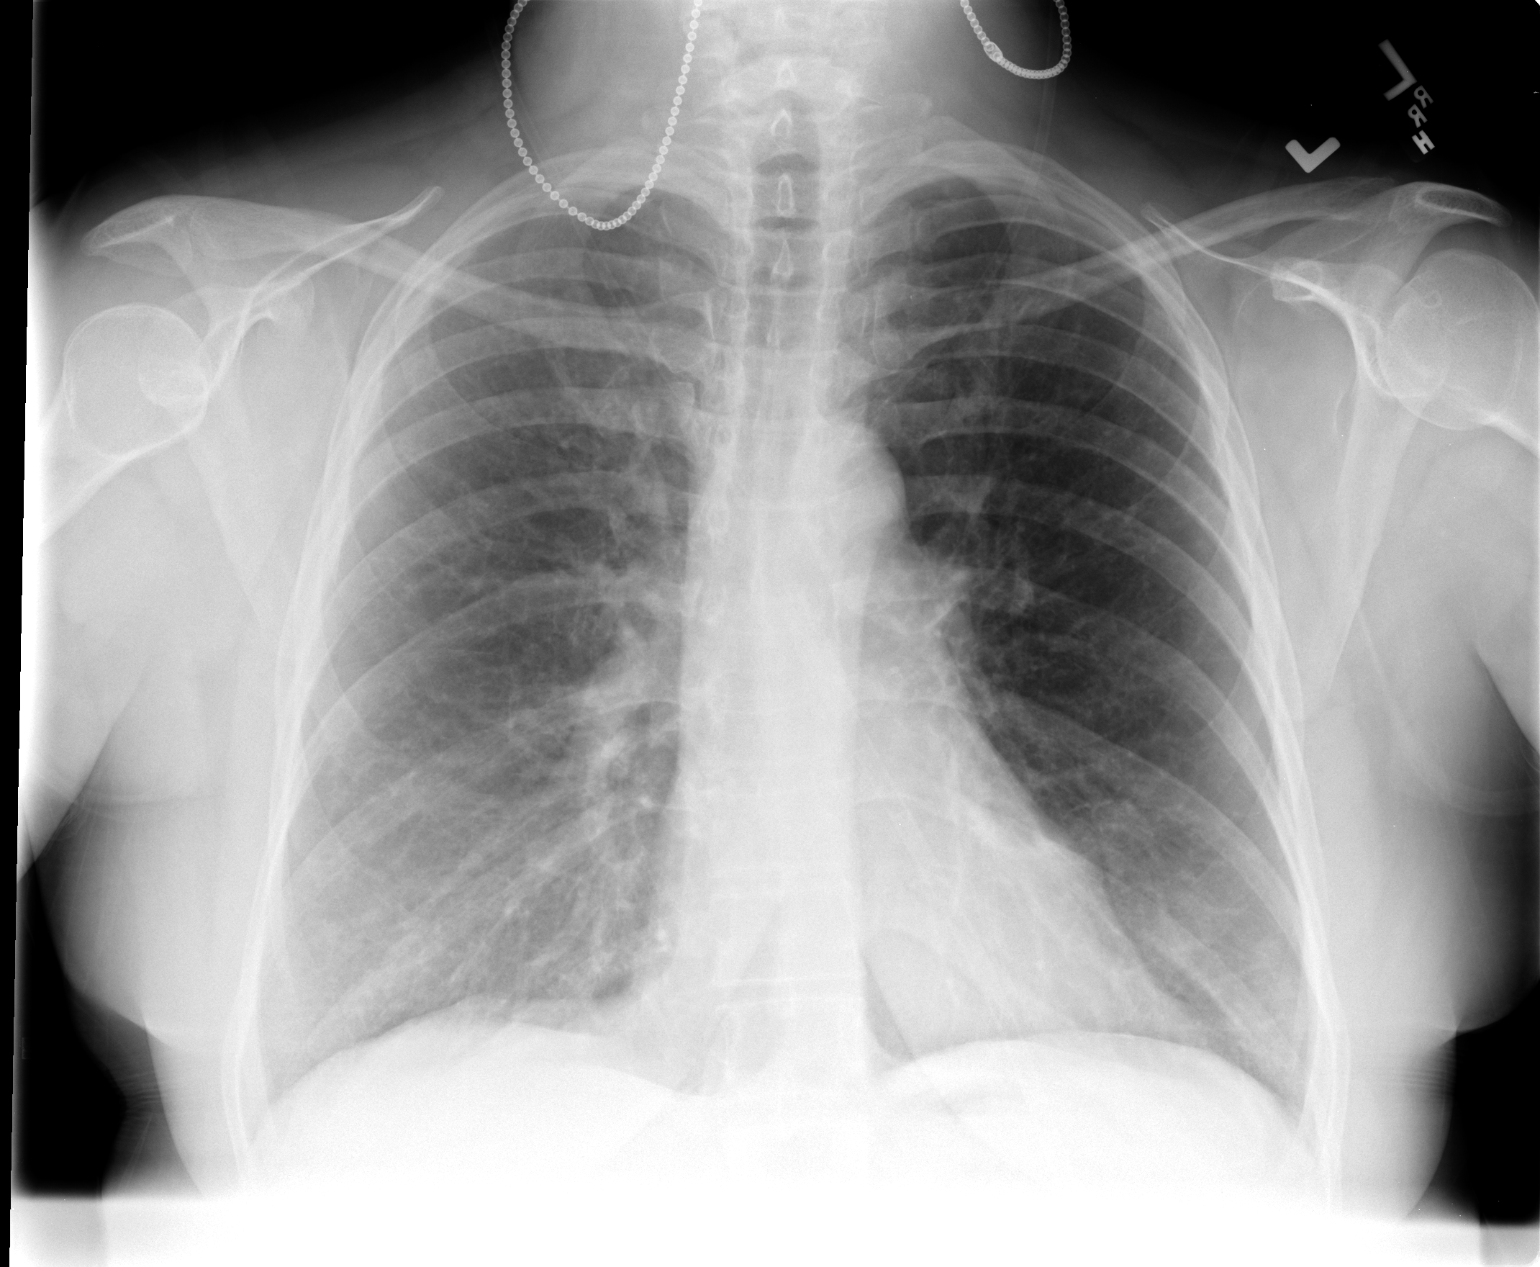

[view not recorded (2 of 2)]
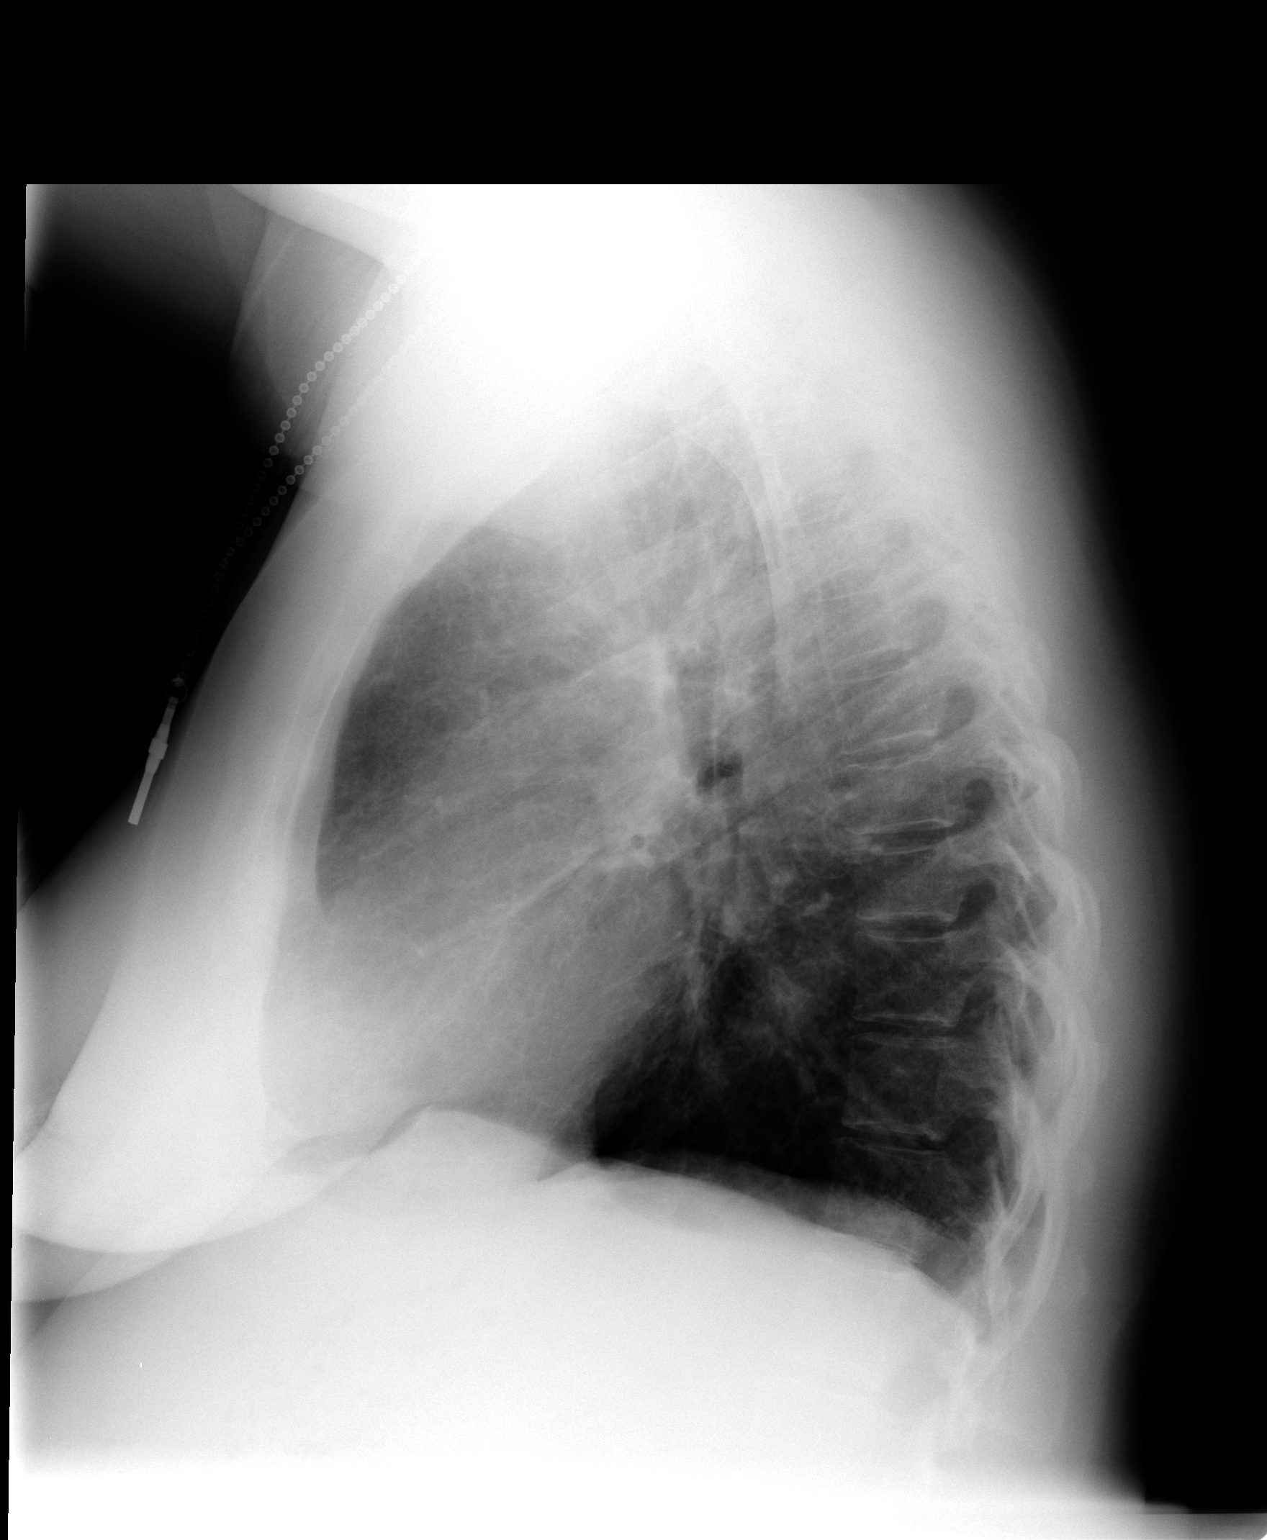

[2 of 2 positions shown; findings below may reference images not displayed]

FINDINGS: Normal heart size and vascularity.  Stable chronic
interstitial changes as before without superimposed pneumonia or
edema.  No effusion or pneumothorax.  Trachea is midline.  No
significant interval change.
IMPRESSION: Chronic interstitial changes.  Stable exam.

## 2014-05-17 ENCOUNTER — Ambulatory Visit (INDEPENDENT_AMBULATORY_CARE_PROVIDER_SITE_OTHER): Payer: Medicaid Other | Admitting: Otolaryngology

## 2014-05-27 IMAGING — CR DG CHEST 2V
2 series · 2 of 2 positions shown · non-contrast
Comparison: PA and lateral chest 03/31/2013 and 11/18/2012.

CLINICAL DATA: Cough and shortness of breath.  COPD.

CHEST - 2 VIEW

[view not recorded (1 of 2)]
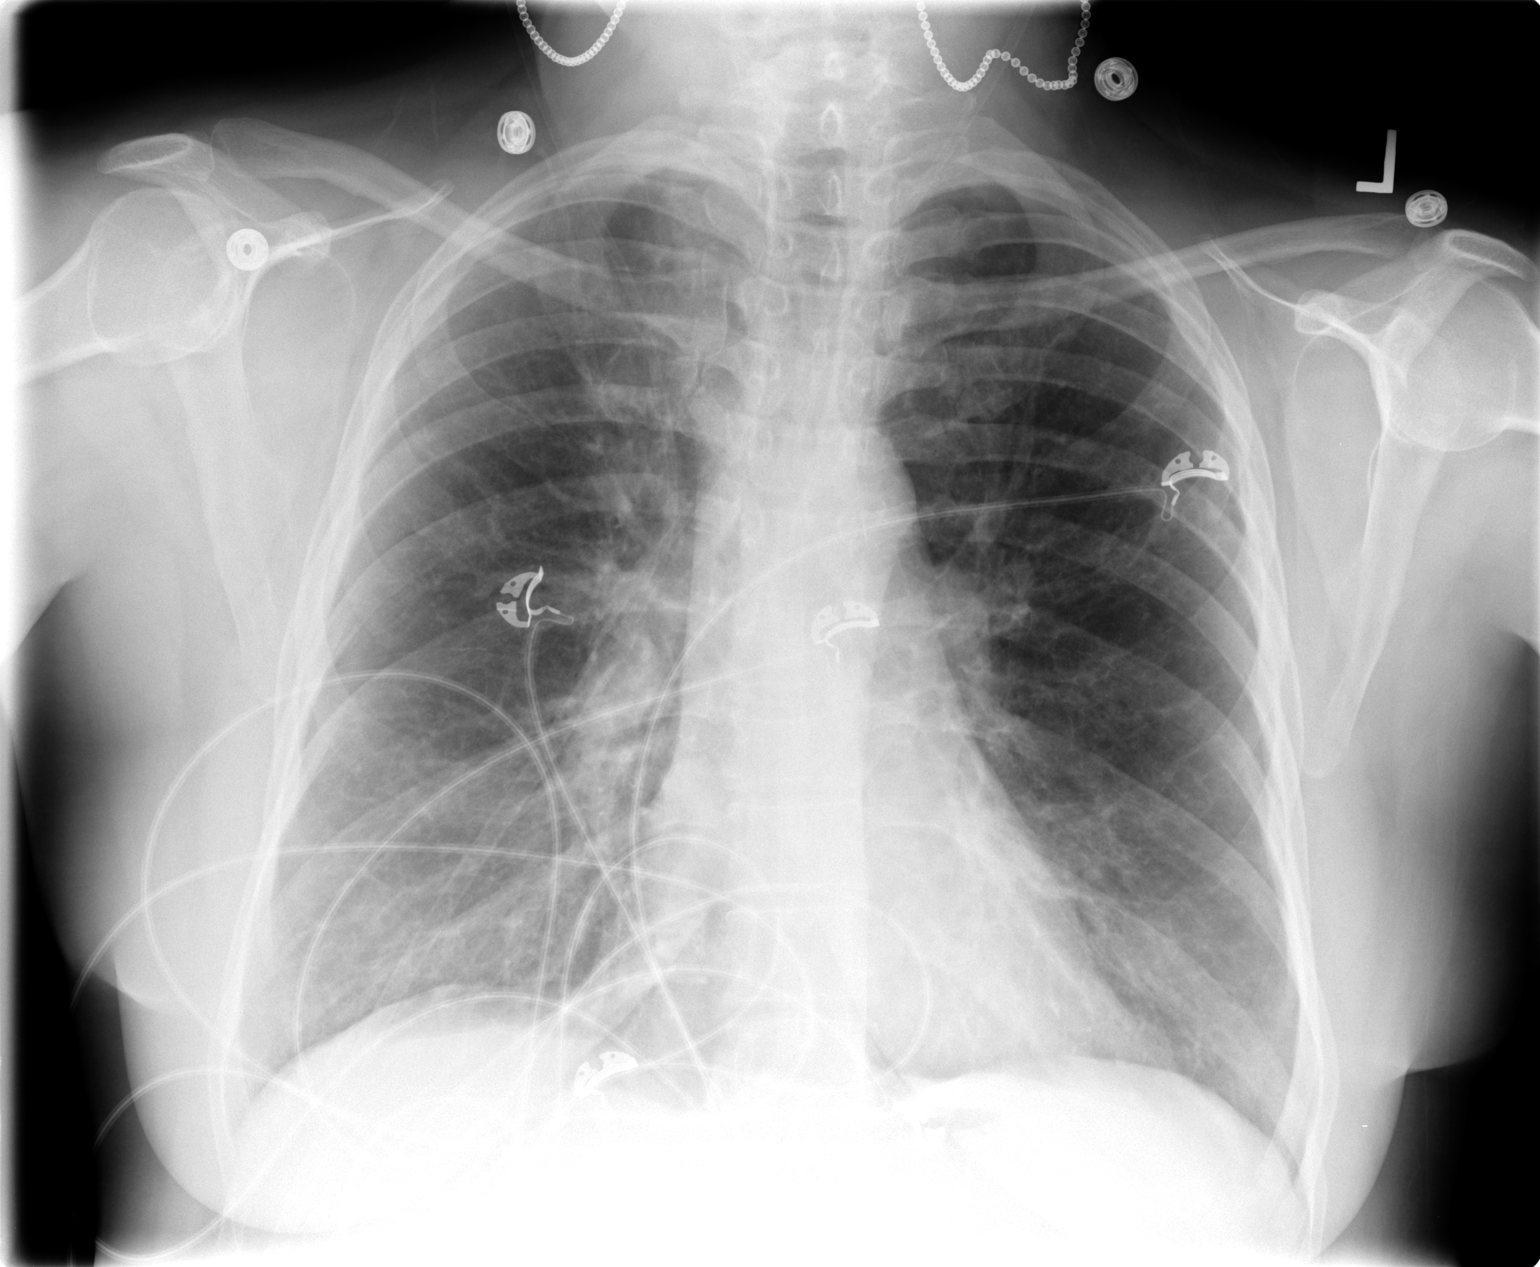

[view not recorded (2 of 2)]
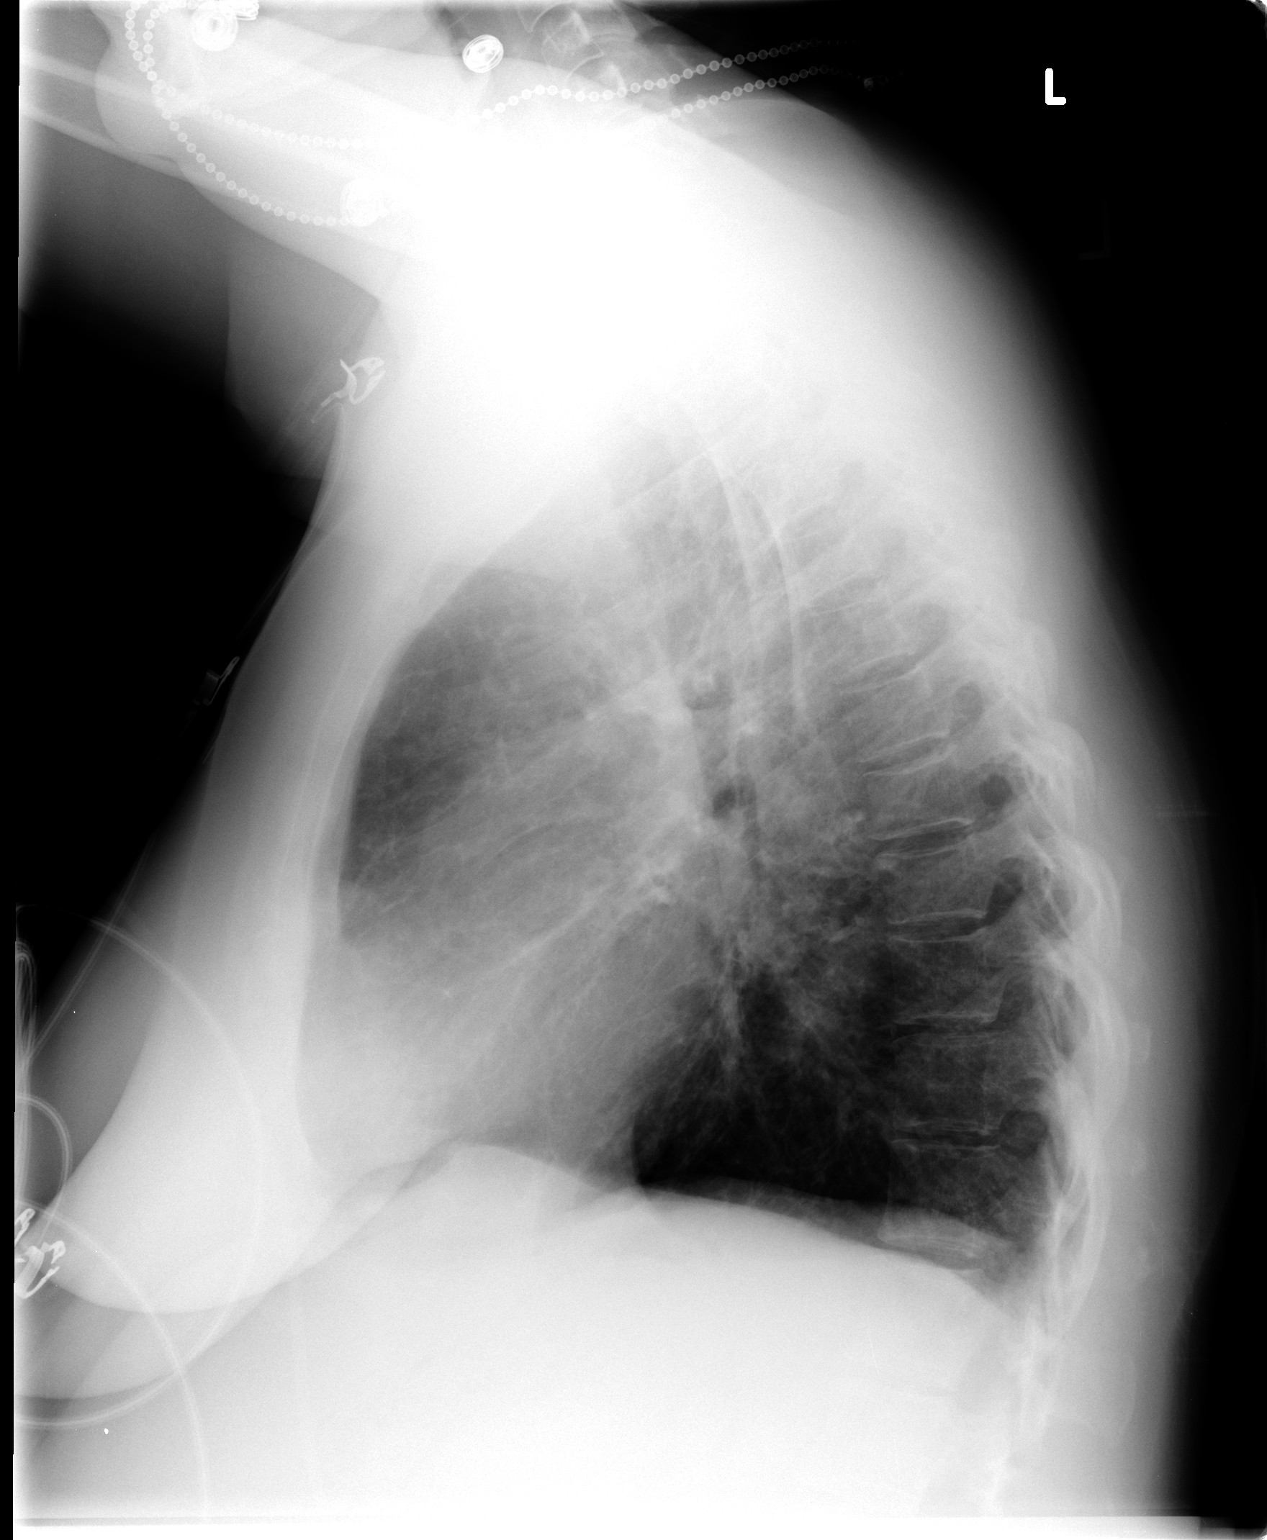

[2 of 2 positions shown; findings below may reference images not displayed]

FINDINGS: Lungs are clear.  Heart size is normal.  No pneumothorax
or pleural fluid.
IMPRESSION: No acute disease.

## 2014-06-07 ENCOUNTER — Ambulatory Visit (INDEPENDENT_AMBULATORY_CARE_PROVIDER_SITE_OTHER): Payer: Medicaid Other | Admitting: Otolaryngology

## 2014-06-07 DIAGNOSIS — H6980 Other specified disorders of Eustachian tube, unspecified ear: Secondary | ICD-10-CM

## 2014-06-07 DIAGNOSIS — J342 Deviated nasal septum: Secondary | ICD-10-CM

## 2014-06-07 DIAGNOSIS — J310 Chronic rhinitis: Secondary | ICD-10-CM

## 2014-06-19 ENCOUNTER — Other Ambulatory Visit (INDEPENDENT_AMBULATORY_CARE_PROVIDER_SITE_OTHER): Payer: Self-pay | Admitting: Otolaryngology

## 2014-06-19 DIAGNOSIS — J328 Other chronic sinusitis: Secondary | ICD-10-CM

## 2014-06-21 ENCOUNTER — Ambulatory Visit (HOSPITAL_COMMUNITY): Payer: Medicaid Other

## 2014-06-26 ENCOUNTER — Ambulatory Visit (HOSPITAL_COMMUNITY)
Admission: RE | Admit: 2014-06-26 | Discharge: 2014-06-26 | Disposition: A | Discharge status: Home / Self Care | Payer: Medicaid Other | Source: Ambulatory Visit | Attending: Otolaryngology | Admitting: Otolaryngology

## 2014-06-26 DIAGNOSIS — J328 Other chronic sinusitis: Secondary | ICD-10-CM

## 2014-06-26 DIAGNOSIS — J329 Chronic sinusitis, unspecified: Secondary | ICD-10-CM | POA: Diagnosis present

## 2014-06-26 DIAGNOSIS — Z9889 Other specified postprocedural states: Secondary | ICD-10-CM | POA: Insufficient documentation

## 2014-06-26 DIAGNOSIS — J320 Chronic maxillary sinusitis: Secondary | ICD-10-CM | POA: Insufficient documentation

## 2014-06-28 ENCOUNTER — Ambulatory Visit (INDEPENDENT_AMBULATORY_CARE_PROVIDER_SITE_OTHER): Payer: Medicaid Other | Admitting: Otolaryngology

## 2014-06-28 DIAGNOSIS — H902 Conductive hearing loss, unspecified: Secondary | ICD-10-CM

## 2014-06-28 DIAGNOSIS — H6980 Other specified disorders of Eustachian tube, unspecified ear: Secondary | ICD-10-CM

## 2014-06-28 DIAGNOSIS — J342 Deviated nasal septum: Secondary | ICD-10-CM

## 2014-06-28 DIAGNOSIS — J310 Chronic rhinitis: Secondary | ICD-10-CM

## 2014-06-28 DIAGNOSIS — H95129 Granulation of postmastoidectomy cavity, unspecified ear: Secondary | ICD-10-CM

## 2014-06-28 DIAGNOSIS — J343 Hypertrophy of nasal turbinates: Secondary | ICD-10-CM

## 2014-07-24 ENCOUNTER — Inpatient Hospital Stay (HOSPITAL_COMMUNITY)
Admission: EM | Admit: 2014-07-24 | Discharge: 2014-07-25 | DRG: 192 | Disposition: A | Discharge status: Home / Self Care | Payer: Medicaid Other | Attending: Pulmonary Disease | Admitting: Pulmonary Disease

## 2014-07-24 ENCOUNTER — Encounter (HOSPITAL_COMMUNITY): Payer: Self-pay | Admitting: Emergency Medicine

## 2014-07-24 ENCOUNTER — Emergency Department (HOSPITAL_COMMUNITY): Payer: Medicaid Other

## 2014-07-24 DIAGNOSIS — F329 Major depressive disorder, single episode, unspecified: Secondary | ICD-10-CM | POA: Diagnosis present

## 2014-07-24 DIAGNOSIS — Z9981 Dependence on supplemental oxygen: Secondary | ICD-10-CM

## 2014-07-24 DIAGNOSIS — M545 Low back pain, unspecified: Secondary | ICD-10-CM | POA: Diagnosis present

## 2014-07-24 DIAGNOSIS — F3289 Other specified depressive episodes: Secondary | ICD-10-CM | POA: Diagnosis present

## 2014-07-24 DIAGNOSIS — K219 Gastro-esophageal reflux disease without esophagitis: Secondary | ICD-10-CM | POA: Diagnosis present

## 2014-07-24 DIAGNOSIS — Z833 Family history of diabetes mellitus: Secondary | ICD-10-CM

## 2014-07-24 DIAGNOSIS — E119 Type 2 diabetes mellitus without complications: Secondary | ICD-10-CM | POA: Diagnosis present

## 2014-07-24 DIAGNOSIS — J441 Chronic obstructive pulmonary disease with (acute) exacerbation: Secondary | ICD-10-CM | POA: Diagnosis not present

## 2014-07-24 DIAGNOSIS — Z8249 Family history of ischemic heart disease and other diseases of the circulatory system: Secondary | ICD-10-CM | POA: Diagnosis not present

## 2014-07-24 DIAGNOSIS — G8929 Other chronic pain: Secondary | ICD-10-CM | POA: Diagnosis present

## 2014-07-24 DIAGNOSIS — J45901 Unspecified asthma with (acute) exacerbation: Secondary | ICD-10-CM | POA: Diagnosis not present

## 2014-07-24 DIAGNOSIS — F17210 Nicotine dependence, cigarettes, uncomplicated: Secondary | ICD-10-CM | POA: Diagnosis present

## 2014-07-24 DIAGNOSIS — R0609 Other forms of dyspnea: Secondary | ICD-10-CM | POA: Diagnosis not present

## 2014-07-24 DIAGNOSIS — E139 Other specified diabetes mellitus without complications: Secondary | ICD-10-CM

## 2014-07-24 DIAGNOSIS — Z79899 Other long term (current) drug therapy: Secondary | ICD-10-CM

## 2014-07-24 DIAGNOSIS — I10 Essential (primary) hypertension: Secondary | ICD-10-CM | POA: Diagnosis present

## 2014-07-24 DIAGNOSIS — F17200 Nicotine dependence, unspecified, uncomplicated: Secondary | ICD-10-CM | POA: Diagnosis present

## 2014-07-24 DIAGNOSIS — F411 Generalized anxiety disorder: Secondary | ICD-10-CM | POA: Diagnosis present

## 2014-07-24 DIAGNOSIS — R0989 Other specified symptoms and signs involving the circulatory and respiratory systems: Secondary | ICD-10-CM | POA: Diagnosis not present

## 2014-07-24 LAB — CBC WITH DIFFERENTIAL/PLATELET
Basophils Absolute: 0 10*3/uL (ref 0.0–0.1)
Basophils Relative: 0 % (ref 0–1)
EOS PCT: 1 % (ref 0–5)
Eosinophils Absolute: 0.1 10*3/uL (ref 0.0–0.7)
HEMATOCRIT: 39.3 % (ref 36.0–46.0)
HEMOGLOBIN: 13 g/dL (ref 12.0–15.0)
LYMPHS ABS: 2.2 10*3/uL (ref 0.7–4.0)
Lymphocytes Relative: 23 % (ref 12–46)
MCH: 28.6 pg (ref 26.0–34.0)
MCHC: 33.1 g/dL (ref 30.0–36.0)
MCV: 86.6 fL (ref 78.0–100.0)
MONO ABS: 0.6 10*3/uL (ref 0.1–1.0)
Monocytes Relative: 6 % (ref 3–12)
NEUTROS PCT: 70 % (ref 43–77)
Neutro Abs: 6.5 10*3/uL (ref 1.7–7.7)
Platelets: 371 10*3/uL (ref 150–400)
RBC: 4.54 MIL/uL (ref 3.87–5.11)
RDW: 12.8 % (ref 11.5–15.5)
WBC: 9.4 10*3/uL (ref 4.0–10.5)

## 2014-07-24 LAB — BASIC METABOLIC PANEL
Anion gap: 8 (ref 5–15)
BUN: 6 mg/dL (ref 6–23)
CO2: 33 mEq/L — ABNORMAL HIGH (ref 19–32)
Calcium: 8.8 mg/dL (ref 8.4–10.5)
Chloride: 98 mEq/L (ref 96–112)
Creatinine, Ser: 0.46 mg/dL — ABNORMAL LOW (ref 0.50–1.10)
GFR calc Af Amer: >90 mL/min (ref >90)
GFR calc non Af Amer: >90 mL/min (ref >90)
Glucose, Bld: 182 mg/dL — ABNORMAL HIGH (ref 70–99)
POTASSIUM: 3.1 meq/L — AB (ref 3.7–5.3)
Sodium: 139 mEq/L (ref 137–147)

## 2014-07-24 LAB — BLOOD GAS, ARTERIAL
Acid-Base Excess: 7.4 mmol/L — ABNORMAL HIGH (ref 0.0–2.0)
BICARBONATE: 32.4 meq/L — AB (ref 20.0–24.0)
Drawn by: 27407
O2 Content: 3 L/min
O2 Saturation: 96.9 %
PATIENT TEMPERATURE: 37
PCO2 ART: 54.3 mmHg — AB (ref 35.0–45.0)
PH ART: 7.393 (ref 7.350–7.450)
TCO2: 29.2 mmol/L (ref 0–100)
pO2, Arterial: 86.4 mmHg (ref 80.0–100.0)

## 2014-07-24 LAB — URINALYSIS, ROUTINE W REFLEX MICROSCOPIC
Bilirubin Urine: NEGATIVE
Glucose, UA: NEGATIVE mg/dL
Hgb urine dipstick: NEGATIVE
Ketones, ur: NEGATIVE mg/dL
LEUKOCYTES UA: NEGATIVE
NITRITE: NEGATIVE
PH: 7 (ref 5.0–8.0)
Protein, ur: NEGATIVE mg/dL
SPECIFIC GRAVITY, URINE: 1.01 (ref 1.005–1.030)
Urobilinogen, UA: 1 mg/dL (ref 0.0–1.0)

## 2014-07-24 LAB — GLUCOSE, CAPILLARY
GLUCOSE-CAPILLARY: 219 mg/dL — AB (ref 70–99)
Glucose-Capillary: 307 mg/dL — ABNORMAL HIGH (ref 70–99)

## 2014-07-24 LAB — POC URINE PREG, ED: PREG TEST UR: NEGATIVE

## 2014-07-24 LAB — TROPONIN I: Troponin I: <0.3 ng/mL (ref <0.30)

## 2014-07-24 LAB — I-STAT CG4 LACTIC ACID, ED: Lactic Acid, Venous: 0.64 mmol/L (ref 0.5–2.2)

## 2014-07-24 MED ORDER — SUMATRIPTAN SUCCINATE 50 MG PO TABS
100.0000 mg | ORAL_TABLET | ORAL | Status: DC | PRN
  Administered 2014-07-25: 100 mg via ORAL
  Filled 2014-07-24: qty 1

## 2014-07-24 MED ORDER — LISINOPRIL 10 MG PO TABS: 20.0000 mg | ORAL_TABLET | Freq: Every day | ORAL | Status: DC

## 2014-07-24 MED ORDER — INSULIN ASPART 100 UNIT/ML ~~LOC~~ SOLN
0.0000 [IU] | Freq: Every day | SUBCUTANEOUS | Status: DC
  Administered 2014-07-24: 4 [IU] via SUBCUTANEOUS

## 2014-07-24 MED ORDER — FENTANYL CITRATE 0.05 MG/ML IJ SOLN
50.0000 ug | Freq: Once | INTRAMUSCULAR | Status: AC
Start: 2014-07-24 — End: 2014-07-24
  Administered 2014-07-24: 50 ug via INTRAVENOUS
  Filled 2014-07-24: qty 2

## 2014-07-24 MED ORDER — ALBUTEROL SULFATE (2.5 MG/3ML) 0.083% IN NEBU: 2.5000 mg | INHALATION_SOLUTION | RESPIRATORY_TRACT | Status: DC | PRN

## 2014-07-24 MED ORDER — SIMETHICONE 80 MG PO CHEW
160.0000 mg | CHEWABLE_TABLET | Freq: Every morning | ORAL | Status: DC
  Administered 2014-07-25: 160 mg via ORAL
  Filled 2014-07-24: qty 2

## 2014-07-24 MED ORDER — HEPARIN SODIUM (PORCINE) 5000 UNIT/ML IJ SOLN
5000.0000 [IU] | Freq: Three times a day (TID) | INTRAMUSCULAR | Status: DC
  Administered 2014-07-24 – 2014-07-25 (×2): 5000 [IU] via SUBCUTANEOUS
  Filled 2014-07-24 (×2): qty 1

## 2014-07-24 MED ORDER — METHYLPREDNISOLONE SODIUM SUCC 125 MG IJ SOLR
125.0000 mg | Freq: Four times a day (QID) | INTRAMUSCULAR | Status: DC
  Administered 2014-07-24 – 2014-07-25 (×3): 125 mg via INTRAVENOUS
  Filled 2014-07-24 (×3): qty 2

## 2014-07-24 MED ORDER — METHYLPREDNISOLONE SODIUM SUCC 125 MG IJ SOLR
125.0000 mg | Freq: Once | INTRAMUSCULAR | Status: AC
  Administered 2014-07-24: 125 mg via INTRAVENOUS
  Filled 2014-07-24: qty 2

## 2014-07-24 MED ORDER — METHYLPREDNISOLONE SODIUM SUCC 125 MG IJ SOLR: 125.0000 mg | Freq: Four times a day (QID) | INTRAMUSCULAR | Status: DC

## 2014-07-24 MED ORDER — ALBUTEROL (5 MG/ML) CONTINUOUS INHALATION SOLN
15.0000 mg/h | INHALATION_SOLUTION | Freq: Once | RESPIRATORY_TRACT | Status: AC
  Administered 2014-07-24: 15 mg/h via RESPIRATORY_TRACT
  Filled 2014-07-24: qty 20

## 2014-07-24 MED ORDER — FLUTICASONE PROPIONATE 50 MCG/ACT NA SUSP
2.0000 | Freq: Every day | NASAL | Status: DC
  Administered 2014-07-25: 2 via NASAL
  Filled 2014-07-24 (×2): qty 16

## 2014-07-24 MED ORDER — INSULIN ASPART 100 UNIT/ML ~~LOC~~ SOLN
0.0000 [IU] | Freq: Three times a day (TID) | SUBCUTANEOUS | Status: DC
Start: 2014-07-25 — End: 2014-07-25
  Administered 2014-07-25: 7 [IU] via SUBCUTANEOUS

## 2014-07-24 MED ORDER — DOCUSATE SODIUM 100 MG PO CAPS
200.0000 mg | ORAL_CAPSULE | Freq: Every morning | ORAL | Status: DC
  Administered 2014-07-25: 200 mg via ORAL
  Filled 2014-07-24 (×3): qty 2

## 2014-07-24 MED ORDER — ONDANSETRON HCL 4 MG/2ML IJ SOLN: 4.0000 mg | Freq: Four times a day (QID) | INTRAMUSCULAR | Status: DC | PRN

## 2014-07-24 MED ORDER — PROMETHAZINE HCL 12.5 MG PO TABS: 25.0000 mg | ORAL_TABLET | Freq: Four times a day (QID) | ORAL | Status: DC | PRN

## 2014-07-24 MED ORDER — BUDESONIDE-FORMOTEROL FUMARATE 160-4.5 MCG/ACT IN AERO
2.0000 | INHALATION_SPRAY | Freq: Two times a day (BID) | RESPIRATORY_TRACT | Status: DC
  Administered 2014-07-24: 2 via RESPIRATORY_TRACT
  Filled 2014-07-24: qty 6

## 2014-07-24 MED ORDER — POTASSIUM CHLORIDE CRYS ER 20 MEQ PO TBCR
40.0000 meq | EXTENDED_RELEASE_TABLET | Freq: Once | ORAL | Status: AC
  Administered 2014-07-24: 40 meq via ORAL
  Filled 2014-07-24: qty 2

## 2014-07-24 MED ORDER — SODIUM CHLORIDE 0.9 % IV SOLN
INTRAVENOUS | Status: DC
  Administered 2014-07-24: 19:00:00 via INTRAVENOUS

## 2014-07-24 MED ORDER — PANTOPRAZOLE SODIUM 40 MG PO TBEC
40.0000 mg | DELAYED_RELEASE_TABLET | Freq: Every day | ORAL | Status: DC
  Administered 2014-07-24 – 2014-07-25 (×2): 40 mg via ORAL
  Filled 2014-07-24 (×2): qty 1

## 2014-07-24 MED ORDER — METHOCARBAMOL 500 MG PO TABS
500.0000 mg | ORAL_TABLET | Freq: Two times a day (BID) | ORAL | Status: DC
  Administered 2014-07-24 – 2014-07-25 (×2): 500 mg via ORAL
  Filled 2014-07-24 (×2): qty 1

## 2014-07-24 MED ORDER — ONDANSETRON HCL 4 MG PO TABS
4.0000 mg | ORAL_TABLET | Freq: Four times a day (QID) | ORAL | Status: DC | PRN
  Administered 2014-07-25: 4 mg via ORAL
  Filled 2014-07-24: qty 1

## 2014-07-24 MED ORDER — ONDANSETRON HCL 4 MG/2ML IJ SOLN
4.0000 mg | Freq: Once | INTRAMUSCULAR | Status: AC
  Administered 2014-07-24: 4 mg via INTRAVENOUS
  Filled 2014-07-24: qty 2

## 2014-07-24 MED ORDER — OXYCODONE HCL 5 MG PO TABS
15.0000 mg | ORAL_TABLET | ORAL | Status: DC | PRN
  Administered 2014-07-24 – 2014-07-25 (×4): 15 mg via ORAL
  Filled 2014-07-24 (×4): qty 3

## 2014-07-24 MED ORDER — SODIUM CHLORIDE 0.9 % IV BOLUS (SEPSIS)
1000.0000 mL | Freq: Once | INTRAVENOUS | Status: AC
  Administered 2014-07-24: 1000 mL via INTRAVENOUS

## 2014-07-24 MED ORDER — PREGABALIN 50 MG PO CAPS: 50.0000 mg | ORAL_CAPSULE | Freq: Three times a day (TID) | ORAL | Status: DC

## 2014-07-24 MED ORDER — TIOTROPIUM BROMIDE MONOHYDRATE 18 MCG IN CAPS
18.0000 ug | ORAL_CAPSULE | Freq: Every day | RESPIRATORY_TRACT | Status: DC
  Administered 2014-07-25: 18 ug via RESPIRATORY_TRACT
  Filled 2014-07-24: qty 5

## 2014-07-24 MED ORDER — BUDESONIDE-FORMOTEROL FUMARATE 160-4.5 MCG/ACT IN AERO
INHALATION_SPRAY | RESPIRATORY_TRACT | Status: AC
Start: 2014-07-24 — End: 2014-07-24
  Filled 2014-07-24: qty 6

## 2014-07-24 MED ORDER — OXYMETAZOLINE HCL 0.05 % NA SOLN: 1.0000 | Freq: Two times a day (BID) | NASAL | Status: DC | PRN

## 2014-07-24 MED ORDER — ACETAMINOPHEN 500 MG PO TABS: 500.0000 mg | ORAL_TABLET | Freq: Four times a day (QID) | ORAL | Status: DC | PRN

## 2014-07-24 MED ORDER — LISINOPRIL 10 MG PO TABS
20.0000 mg | ORAL_TABLET | Freq: Every day | ORAL | Status: DC
  Administered 2014-07-25: 20 mg via ORAL
  Filled 2014-07-24: qty 2

## 2014-07-24 MED ORDER — PREGABALIN 50 MG PO CAPS
50.0000 mg | ORAL_CAPSULE | Freq: Three times a day (TID) | ORAL | Status: DC
  Administered 2014-07-24 – 2014-07-25 (×2): 50 mg via ORAL
  Filled 2014-07-24 (×2): qty 1

## 2014-07-24 NOTE — H&P (Signed)
[facility] History and Physical  [name] ZHY:[id number] DOB: 1966-02-16 DOA: 07/24/2014  Referring physician: ER PCP: Fredirick Maudlin, MD   Chief Complaint: Dyspnea  HPI: [name] is a 48 y.o. female  This is a 48 year old lady, with ongoing tobacco abuse, who presents with a 3 to four-day history of increasing dyspnea associated with a productive cough. The cough is productive of yellow sputum but she has not had a fever. Evaluation in the emergency room showed that she has improved somewhat with continuous generalized and feels somewhat better but not quite back to her usual self. She is now going to be admitted for further management.   Review of Systems:  Constitutional:  No weight loss, night sweats, Fevers, chills, fatigue.  HEENT:  No headaches, Difficulty swallowing,Tooth/dental problems,Sore throat,  No sneezing, itching, ear ache, nasal congestion, post nasal drip,  Cardio-vascular:  No chest pain, Orthopnea, PND, swelling in lower extremities, anasarca, dizziness, palpitations  GI:  No heartburn, indigestion, abdominal pain, nausea, vomiting, diarrhea, change in bowel habits, loss of appetite    Skin:  no rash or lesions.  GU:  no dysuria, change in color of urine, no urgency or frequency. No flank pain.  Musculoskeletal:  No joint pain or swelling. No decreased range of motion. No back pain.  Psych:  No change in mood or affect. No depression or anxiety. No memory loss.   Past Medical History  Diagnosis Date  . Migraine headache   . On home O2   . COPD (chronic obstructive pulmonary disease)   . Chronic back pain   . Hyperglycemia, drug-induced     steroid induced hyperglycemia  . ARDS (adult respiratory distress syndrome)     Jan 2011  . Diabetes mellitus   . Pneumonia   . Angina   . Asthma   . Shortness of breath   . Recurrent upper respiratory infection (URI)   . GERD (gastroesophageal reflux disease) 12/21/2012   Past Surgical  History  Procedure Laterality Date  . Tubal ligation    . Uterine ablasion    . C-section    . Tracheostomy      decannulated 12/2009   Social History:  reports that she has been smoking Cigarettes.  She has a 8.25 pack-year smoking history. She does not have any smokeless tobacco history on file. She reports that she does not drink alcohol or use illicit drugs.  Allergies  Allergen Reactions  . Penicillins Other (See Comments)    convulsions  . Levaquin [Levofloxacin In D5w] Itching and Swelling  . Morphine Nausea Only    Family History  Problem Relation Age of Onset  . Coronary artery disease Brother   . Diabetes Other   . Cancer Other   . Hypertension Other      Prior to Admission medications   Medication Sig Start Date End Date Taking? Authorizing Provider  acetaminophen (TYLENOL) 500 MG tablet Take 500 mg by mouth every 6 (six) hours as needed for fever.    Yes Historical Provider, MD  albuterol (PROAIR HFA) 108 (90 BASE) MCG/ACT inhaler Inhale 2 puffs into the lungs every 6 (six) hours as needed. For shortness of breATH   Yes Historical Provider, MD  albuterol (PROVENTIL) (2.5 MG/3ML) 0.083% nebulizer solution Take 2.5 mg by nebulization every 4 (four) hours as needed. For shortness of breath  03/19/11  Yes Lolly S Parrett, NP  budesonide-formoterol (SYMBICORT) 160-4.5 MCG/ACT inhaler Inhale 2 puffs into the lungs 2 (two) times daily.  Yes Historical Provider, MD  docusate sodium (COLACE) 100 MG capsule Take 200 mg by mouth every morning.   Yes Historical Provider, MD  fluticasone (FLONASE) 50 MCG/ACT nasal spray Place 2 sprays into the nose daily.   Yes Historical Provider, MD  lisinopril (PRINIVIL,ZESTRIL) 20 MG tablet Take 20 mg by mouth daily.   Yes Historical Provider, MD  methocarbamol (ROBAXIN) 500 MG tablet Take 500 mg by mouth 2 (two) times daily.   Yes Historical Provider, MD  OxyCODONE (OXYCONTIN) 10 mg T12A Take 1 tablet (10 mg total) by mouth every 12 (twelve)  hours. 04/24/13  Yes Fredirick Maudlin, MD  oxyCODONE (ROXICODONE) 15 MG immediate release tablet Take 15 mg by mouth every 4 (four) hours as needed for pain.   Yes Historical Provider, MD  Oxymetazoline HCl (AFRIN NASAL SPRAY NA) Place 1 spray into the nose daily as needed (Congestion).    Yes Historical Provider, MD  pantoprazole (PROTONIX) 40 MG tablet Take 40 mg by mouth daily.   Yes Historical Provider, MD  pregabalin (LYRICA) 50 MG capsule Take 50 mg by mouth every 8 (eight) hours.    Yes Historical Provider, MD  promethazine (PHENERGAN) 25 MG tablet Take 25 mg by mouth every 6 (six) hours as needed. For nausea   Yes Historical Provider, MD  Simethicone (GAS-X EXTRA STRENGTH) 125 MG CAPS Take 2 capsules by mouth every morning.   Yes Historical Provider, MD  SUMAtriptan (IMITREX) 100 MG tablet Take 100 mg by mouth every 2 (two) hours as needed for migraine or headache. May repeat in 2 hours if headache persists or recurs.   Yes Historical Provider, MD  tiotropium (SPIRIVA) 18 MCG inhalation capsule Place 18 mcg into inhaler and inhale daily.   Yes Historical Provider, MD   Physical Exam: Filed Vitals:   07/24/14 1700 07/24/14 1715 07/24/14 1730 07/24/14 1745  BP: 126/85     Pulse: 117 116 109 110  Temp:      TempSrc:      Resp: Height:      Weight:      SpO2: 96% 99% 93% 93%    Wt Readings from Last 3 Encounters:  07/24/14 68.947 kg (152 lb)  03/11/14 68.947 kg (152 lb)  06/22/13 71.215 kg (157 lb)    General:  Appears calm and comfortable. No increased work of breathing. No cyanosis peripherally or centrally. Eyes: PERRL, normal lids, irises & conjunctiva ENT: grossly normal hearing, lips & tongue Neck: no LAD, masses or thyromegaly Cardiovascular: RRR, no m/r/g. No LE edema. Telemetry: SR, no arrhythmias  Respiratory: Bilateral wheezing, moderately tight. No crackles or rhonchi breathing. Abdomen: soft, ntnd Skin: no rash or induration seen on limited  exam Musculoskeletal: grossly normal tone BUE/BLE Psychiatric: grossly normal mood and affect, speech fluent and appropriate Neurologic: grossly non-focal.          Labs on Admission:  Basic Metabolic Panel:  Recent Labs Lab 07/24/14 1527  NA 139  K 3.1*  CL 98  CO2 33*  GLUCOSE 182*  BUN 6  CREATININE 0.46*  CALCIUM 8.8   Liver Function Tests: No results found for this basename: AST, ALT, ALKPHOS, BILITOT, PROT, ALBUMIN,  in the last 168 hours No results found for this basename: LIPASE, AMYLASE,  in the last 168 hours No results found for this basename: AMMONIA,  in the last 168 hours CBC:  Recent Labs Lab 07/24/14 1527  WBC 9.4  NEUTROABS 6.5  HGB 13.0  HCT 39.3  MCV 86.6  PLT 371   Cardiac Enzymes:  Recent Labs Lab 07/24/14 1527  TROPONINI <0.30    BNP (last 3 results) No results found for this basename: PROBNP,  in the last 8760 hours CBG: No results found for this basename: GLUCAP,  in the last 168 hours  Radiological Exams on Admission: Dg Chest Port 1 View  07/24/2014   CLINICAL DATA:  Shortness of Breath  EXAM: PORTABLE CHEST - 1 VIEW  COMPARISON:  03/11/2014  FINDINGS: Cardiomediastinal silhouette is stable. Stable streaky right basilar atelectasis or scarring. No segmental infiltrate or pulmonary edema.  IMPRESSION: No segmental infiltrate or pulmonary edema. Stable streaky right basilar atelectasis or scarring.   Electronically Signed   By: Natasha Mead M.D.   On: 07/24/2014 16:38      Assessment/Plan   1. COPD exacerbation. 2. Ongoing tobacco abuse. 3. Diabetes mellitus, diet controlled. 4. Hypertension  Plan: 1. Admit . 2. Intravenous steroids. 3. Bronchodilators. 4. Consider antibiotics if she does not improve. 5. Sliding scale insulin.  Other recommendations will depend on patient's hospital progress.   Code Status: Full code  DVT Prophylaxis: Heparin.  Family Communication: I discussed the plan with the patient at the  bedside.   Disposition Plan: Home when medically stable.   Time spent: 60 minutes.  Wilson Singer [facility] Pager [phone].  **Disclaimer: This note may have been dictated with voice recognition software. Similar sounding words can inadvertently be transcribed and this note may contain transcription errors which may not have been corrected upon publication of note.**

## 2014-07-24 NOTE — ED Provider Notes (Signed)
TIME SEEN: 3:09 PM  CHIEF COMPLAINT: Shortness of breath, subjective fever, productive cough, chest pain  HPI: Patient is a 48 year old female with history of COPD on 3 L nasal cannula at baseline, diabetes who presents to the emergency department with 4 days of subjective fever, cough with yellow sputum production, shortness of breath and chest pain is worse with coughing. States her chest pain is on the right side and is sharp. Worse with coughing. She states she's had increased her oxygen from 3 L to 4 L at home. She denies any sick contacts or recent travel. Denies a history of PE or DVT. No history of coronary artery disease. States she feels like she has pneumonia she has had this in the past.  ROS: See HPI Constitutional:  fever  Eyes: no drainage  ENT: no runny nose   Cardiovascular:   chest pain  Resp:  SOB  GI: no vomiting GU: no dysuria Integumentary: no rash  Allergy: no hives  Musculoskeletal: no leg swelling  Neurological: no slurred speech ROS otherwise negative  PAST MEDICAL HISTORY/PAST SURGICAL HISTORY:  Past Medical History  Diagnosis Date  . Migraine headache   . On home O2   . COPD (chronic obstructive pulmonary disease)   . Chronic back pain   . Hyperglycemia, drug-induced     steroid induced hyperglycemia  . ARDS (adult respiratory distress syndrome)     Jan 2011  . Diabetes mellitus   . Pneumonia   . Angina   . Asthma   . Shortness of breath   . Recurrent upper respiratory infection (URI)   . GERD (gastroesophageal reflux disease) 12/21/2012    MEDICATIONS:  Prior to Admission medications   Medication Sig Start Date End Date Taking? Authorizing Provider  acetaminophen (TYLENOL) 500 MG tablet Take 500 mg by mouth every 6 (six) hours as needed for fever.     Historical Provider, MD  albuterol (PROAIR HFA) 108 (90 BASE) MCG/ACT inhaler Inhale 2 puffs into the lungs every 6 (six) hours as needed. For shortness of breATH    Historical Provider, MD   albuterol (PROVENTIL) (2.5 MG/3ML) 0.083% nebulizer solution Take 2.5 mg by nebulization every 4 (four) hours as needed. For shortness of breath  03/19/11   Tamarra S Parrett, NP  budesonide-formoterol (SYMBICORT) 160-4.5 MCG/ACT inhaler Inhale 2 puffs into the lungs 2 (two) times daily.    Historical Provider, MD  cephALEXin (KEFLEX) 500 MG capsule Take 1 capsule (500 mg total) by mouth 3 (three) times daily. 03/14/14   Fredirick Maudlin, MD  Diphenhyd-Hydrocort-Nystatin (FIRST-DUKES MOUTHWASH MT) Use as directed 5 mLs in the mouth or throat daily as needed. FOR THRUSH    Historical Provider, MD  docusate sodium (COLACE) 100 MG capsule Take 200 mg by mouth every morning.    Historical Provider, MD  fluconazole (DIFLUCAN) 100 MG tablet Take 1 tablet (100 mg total) by mouth daily. 03/14/14   Fredirick Maudlin, MD  fluticasone (FLONASE) 50 MCG/ACT nasal spray Place 2 sprays into the nose daily.    Historical Provider, MD  lisinopril (PRINIVIL,ZESTRIL) 20 MG tablet Take 20 mg by mouth daily.    Historical Provider, MD  LORazepam (ATIVAN) 0.5 MG tablet Take 1 tablet (0.5 mg total) by mouth every 6 (six) hours as needed for anxiety. 04/24/13   Fredirick Maudlin, MD  methocarbamol (ROBAXIN) 500 MG tablet Take 500 mg by mouth 2 (two) times daily.    Historical Provider, MD  methylPREDNIsolone (MEDROL DOSPACK) 4 MG tablet  follow package directions 03/14/14   Fredirick Maudlin, MD  OxyCODONE (OXYCONTIN) 10 mg T12A Take 1 tablet (10 mg total) by mouth every 12 (twelve) hours. 04/24/13   Fredirick Maudlin, MD  oxyCODONE (ROXICODONE) 15 MG immediate release tablet Take 15 mg by mouth every 4 (four) hours as needed for pain.    Historical Provider, MD  Oxymetazoline HCl (AFRIN NASAL SPRAY NA) Place 1 spray into the nose daily as needed (Congestion).     Historical Provider, MD  pantoprazole (PROTONIX) 40 MG tablet Take 40 mg by mouth daily.    Historical Provider, MD  pregabalin (LYRICA) 50 MG capsule Take 50 mg by mouth every  8 (eight) hours.     Historical Provider, MD  promethazine (PHENERGAN) 25 MG tablet Take 25 mg by mouth every 6 (six) hours as needed. For nausea    Historical Provider, MD  Simethicone (GAS-X EXTRA STRENGTH) 125 MG CAPS Take 2 capsules by mouth every morning.    Historical Provider, MD  SUMAtriptan (IMITREX) 100 MG tablet Take 100 mg by mouth every 2 (two) hours as needed for migraine or headache. May repeat in 2 hours if headache persists or recurs.    Historical Provider, MD  tiotropium (SPIRIVA) 18 MCG inhalation capsule Place 18 mcg into inhaler and inhale daily.    Historical Provider, MD    ALLERGIES:  Allergies  Allergen Reactions  . Penicillins Other (See Comments)    convulsions  . Levaquin [Levofloxacin In D5w] Itching and Swelling  . Morphine Nausea Only    SOCIAL HISTORY:  History  Substance Use Topics  . Smoking status: Current Every Day Smoker -- 0.25 packs/day for 33 years    Types: Cigarettes  . Smokeless tobacco: Not on file     Comment: 1/2 pack a day  since age 44  . Alcohol Use: No    FAMILY HISTORY: Family History  Problem Relation Age of Onset  . Coronary artery disease Brother   . Diabetes Other   . Cancer Other   . Hypertension Other     EXAM: BP 147/87  Pulse 96  Temp(Src) 99.5 F (37.5 C) (Oral)  Resp 22  Ht 5' (1.524 m)  Wt 152 lb (68.947 kg)  BMI 29.69 kg/m2  SpO2 98% CONSTITUTIONAL: Alert and oriented and responds appropriately to questions. Well-appearing; well-nourished HEAD: Normocephalic EYES: Conjunctivae clear, PERRL ENT: normal nose; no rhinorrhea; moist mucous membranes; pharynx without lesions noted NECK: Supple, no meningismus, no LAD  CARD: RRR; S1 and S2 appreciated; no murmurs, no clicks, no rubs, no gallops RESP: Normal chest excursion without splinting, patient has tachypnea, diffuse rhonchorous breath sounds with expiratory wheezing, speaking short sentences, mild respiratory distress ABD/GI: Normal bowel sounds;  non-distended; soft, non-tender, no rebound, no guarding BACK:  The back appears normal and is non-tender to palpation, there is no CVA tenderness EXT: Normal ROM in all joints; non-tender to palpation; no edema; normal capillary refill; no cyanosis    SKIN: Normal color for age and race; warm NEURO: Moves all extremities equally PSYCH: The patient's mood and manner are appropriate. Grooming and personal hygiene are appropriate.  MEDICAL DECISION MAKING: Patient here with likely pneumonia versus bronchitis and COPD exacerbation. We'll give continuous albuterol treatment, Solu-Medrol. We'll obtain labs, cultures, ABG, chest x-ray. I feel patient will likely need admission. Her PCP is Dr. Juanetta Gosling. She is otherwise hemodynamically stable.  ED PROGRESS: Patient still has significant wheezing and rhonchi and tachypnea after continuous breathing treatment. She is able  to speak full sentences now. Chest x-ray shows no infiltrate. She has no leukocytosis, fever in the ED. Normal lactate. Suspect this is just a COPD exacerbation and possible bronchitis. We'll admit to hospitalist service for continued nebulizer treatments and IV steroids.   5:04 PM  D/w Dr. Karilyn Cota for admission to medical bed, inpatient.    EKG Interpretation  Date/Time:  Tuesday July 24 2014 15:12:40 EDT Ventricular Rate:  95 PR Interval:  193 QRS Duration: 100 QT Interval:  375 QTC Calculation: 471 R Axis:   64 Text Interpretation:  Sinus rhythm Consider left atrial enlargement Baseline wander in lead(s) III aVL aVF No significant change since last tracing Confirmed by WARD,  DO, KRISTEN [phone]) on 07/24/2014 3:25:36 PM        Layla Maw Ward, DO 07/24/14 1704

## 2014-07-24 NOTE — ED Notes (Signed)
Pt reports for the past 4 days has had productive cough, chest pain, headache, sob, and low grade fever.  Took tylenol today around 1:30.

## 2014-07-25 LAB — COMPREHENSIVE METABOLIC PANEL
ALK PHOS: 91 U/L (ref 39–117)
ALT: 5 U/L (ref 0–35)
AST: 11 U/L (ref 0–37)
Albumin: 3.3 g/dL — ABNORMAL LOW (ref 3.5–5.2)
Anion gap: 12 (ref 5–15)
BUN: 7 mg/dL (ref 6–23)
CALCIUM: 9.1 mg/dL (ref 8.4–10.5)
CO2: 28 mEq/L (ref 19–32)
Chloride: 101 mEq/L (ref 96–112)
Creatinine, Ser: 0.33 mg/dL — ABNORMAL LOW (ref 0.50–1.10)
GFR calc non Af Amer: >90 mL/min (ref >90)
GLUCOSE: 234 mg/dL — AB (ref 70–99)
POTASSIUM: 4 meq/L (ref 3.7–5.3)
SODIUM: 141 meq/L (ref 137–147)
Total Bilirubin: <0.2 mg/dL — ABNORMAL LOW (ref 0.3–1.2)
Total Protein: 7.3 g/dL (ref 6.0–8.3)

## 2014-07-25 LAB — GLUCOSE, CAPILLARY: GLUCOSE-CAPILLARY: 231 mg/dL — AB (ref 70–99)

## 2014-07-25 LAB — CBC
HCT: 40.6 % (ref 36.0–46.0)
HEMOGLOBIN: 13.4 g/dL (ref 12.0–15.0)
MCH: 28.4 pg (ref 26.0–34.0)
MCHC: 33 g/dL (ref 30.0–36.0)
MCV: 86 fL (ref 78.0–100.0)
Platelets: 405 10*3/uL — ABNORMAL HIGH (ref 150–400)
RBC: 4.72 MIL/uL (ref 3.87–5.11)
RDW: 12.7 % (ref 11.5–15.5)
WBC: 9 10*3/uL (ref 4.0–10.5)

## 2014-07-25 MED ORDER — DEXTROSE 5 % IV SOLN
2.0000 g | INTRAVENOUS | Status: DC
  Administered 2014-07-25: 2 g via INTRAVENOUS
  Filled 2014-07-25 (×2): qty 2

## 2014-07-25 MED ORDER — AZITHROMYCIN 250 MG PO TABS: ORAL_TABLET | ORAL | Status: DC

## 2014-07-25 MED ORDER — PREDNISONE 10 MG PO TABS: ORAL_TABLET | ORAL | Status: DC

## 2014-07-25 MED ORDER — DEXTROSE 5 % IV SOLN
500.0000 mg | INTRAVENOUS | Status: DC
  Administered 2014-07-25: 500 mg via INTRAVENOUS
  Filled 2014-07-25 (×2): qty 500

## 2014-07-25 MED ORDER — CEFUROXIME AXETIL 500 MG PO TABS: 500.0000 mg | ORAL_TABLET | Freq: Two times a day (BID) | ORAL | Status: DC

## 2014-07-25 NOTE — Progress Notes (Signed)
UR review complete.  

## 2014-07-25 NOTE — Progress Notes (Signed)
Subjective: She was admitted last night with cough congestion and COPD exacerbation. She says she feels better. She does not want to be in the hospital. She wants to try to go home and have outpatient treatment in my office.  Objective: Vital signs in last 24 hours: Temp:  [98.1 F (36.7 C)-99.6 F (37.6 C)] 98.4 F (36.9 C) (09/02 0535) Pulse Rate:  [88-117] 88 (09/02 0535) Resp:  [10-25] 20 (09/02 0535) BP: (115-147)/(65-87) 115/70 mmHg (09/02 0535) SpO2:  [93 %-99 %] 93 % (09/02 0535) Weight:  [68.947 kg (152 lb)] 68.947 kg (152 lb) (09/01 1512) Weight change:  Last BM Date: 07/23/14  Intake/Output from previous day: 09/01 0701 - 09/02 0700 In: 440.8 [I.V.:440.8] Out: -   PHYSICAL EXAM General appearance: alert, cooperative and no distress Resp: rhonchi bilaterally Cardio: regular rate and rhythm, S1, S2 normal, no murmur, click, rub or gallop GI: soft, non-tender; bowel sounds normal; no masses,  no organomegaly Extremities: extremities normal, atraumatic, no cyanosis or edema  Lab Results:  Results for orders placed during the hospital encounter of 07/24/14 (from the past 48 hour(s))  CBC WITH DIFFERENTIAL     Status: None   Collection Time    07/24/14  3:27 PM      Result Value Ref Range   WBC 9.4  4.0 - 10.5 K/uL   RBC 4.54  3.87 - 5.11 MIL/uL   Hemoglobin 13.0  12.0 - 15.0 g/dL   HCT 39.3  36.0 - 46.0 %   MCV 86.6  78.0 - 100.0 fL   MCH 28.6  26.0 - 34.0 pg   MCHC 33.1  30.0 - 36.0 g/dL   RDW 12.8  11.5 - 15.5 %   Platelets 371  150 - 400 K/uL   Neutrophils Relative % 70  43 - 77 %   Neutro Abs 6.5  1.7 - 7.7 K/uL   Lymphocytes Relative 23  12 - 46 %   Lymphs Abs 2.2  0.7 - 4.0 K/uL   Monocytes Relative 6  3 - 12 %   Monocytes Absolute 0.6  0.1 - 1.0 K/uL   Eosinophils Relative 1  0 - 5 %   Eosinophils Absolute 0.1  0.0 - 0.7 K/uL   Basophils Relative 0  0 - 1 %   Basophils Absolute 0.0  0.0 - 0.1 K/uL  BASIC METABOLIC PANEL     Status: Abnormal   Collection Time    07/24/14  3:27 PM      Result Value Ref Range   Sodium 139  137 - 147 mEq/L   Potassium 3.1 (*) 3.7 - 5.3 mEq/L   Chloride 98  96 - 112 mEq/L   CO2 33 (*) 19 - 32 mEq/L   Glucose, Bld 182 (*) 70 - 99 mg/dL   BUN 6  6 - 23 mg/dL   Creatinine, Ser 0.46 (*) 0.50 - 1.10 mg/dL   Calcium 8.8  8.4 - 10.5 mg/dL   GFR calc non Af Amer >90  >90 mL/min   GFR calc Af Amer >90  >90 mL/min   Comment: (NOTE)     The eGFR has been calculated using the CKD EPI equation.     This calculation has not been validated in all clinical situations.     eGFR's persistently <90 mL/min signify possible Chronic Kidney     Disease.   Anion gap 8  5 - 15  TROPONIN I     Status: None   Collection Time  07/24/14  3:27 PM      Result Value Ref Range   Troponin I <0.30  <0.30 ng/mL   Comment:            Due to the release kinetics of cTnI,     a negative result within the first hours     of the onset of symptoms does not rule out     myocardial infarction with certainty.     If myocardial infarction is still suspected,     repeat the test at appropriate intervals.  BLOOD GAS, ARTERIAL     Status: Abnormal   Collection Time    07/24/14  3:45 PM      Result Value Ref Range   O2 Content 3.0     Delivery systems NASAL CANNULA     pH, Arterial 7.393  7.350 - 7.450   pCO2 arterial 54.3 (*) 35.0 - 45.0 mmHg   pO2, Arterial 86.4  80.0 - 100.0 mmHg   Bicarbonate 32.4 (*) 20.0 - 24.0 mEq/L   TCO2 29.2  0 - 100 mmol/L   Acid-Base Excess 7.4 (*) 0.0 - 2.0 mmol/L   O2 Saturation 96.9     Patient temperature 37.0     Collection site LEFT RADIAL     Drawn by [id number]     Sample type ARTERIAL DRAW     Allens test (pass/fail) PASS  PASS  URINALYSIS, ROUTINE W REFLEX MICROSCOPIC     Status: None   Collection Time    07/24/14  4:05 PM      Result Value Ref Range   Color, Urine YELLOW  YELLOW   APPearance CLEAR  CLEAR   Specific Gravity, Urine 1.010  1.005 - 1.030   pH 7.0  5.0 - 8.0   Glucose,  UA NEGATIVE  NEGATIVE mg/dL   Hgb urine dipstick NEGATIVE  NEGATIVE   Bilirubin Urine NEGATIVE  NEGATIVE   Ketones, ur NEGATIVE  NEGATIVE mg/dL   Protein, ur NEGATIVE  NEGATIVE mg/dL   Urobilinogen, UA 1.0  0.0 - 1.0 mg/dL   Nitrite NEGATIVE  NEGATIVE   Leukocytes, UA NEGATIVE  NEGATIVE   Comment: MICROSCOPIC NOT DONE ON URINES WITH NEGATIVE PROTEIN, BLOOD, LEUKOCYTES, NITRITE, OR GLUCOSE <1000 mg/dL.  I-STAT CG4 LACTIC ACID, ED     Status: None   Collection Time    07/24/14  4:21 PM      Result Value Ref Range   Lactic Acid, Venous 0.64  0.5 - 2.2 mmol/L  POC URINE PREG, ED     Status: None   Collection Time    07/24/14  5:10 PM      Result Value Ref Range   Preg Test, Ur NEGATIVE  NEGATIVE   Comment:            THE SENSITIVITY OF THIS     METHODOLOGY IS >24 mIU/mL  GLUCOSE, CAPILLARY     Status: Abnormal   Collection Time    07/24/14  6:29 PM      Result Value Ref Range   Glucose-Capillary 219 (*) 70 - 99 mg/dL  GLUCOSE, CAPILLARY     Status: Abnormal   Collection Time    07/24/14  8:58 PM      Result Value Ref Range   Glucose-Capillary 307 (*) 70 - 99 mg/dL   Comment 1 Notify RN    COMPREHENSIVE METABOLIC PANEL     Status: Abnormal   Collection Time    07/25/14  6:26 AM  Result Value Ref Range   Sodium 141  137 - 147 mEq/L   Potassium 4.0  3.7 - 5.3 mEq/L   Comment: DELTA CHECK NOTED   Chloride 101  96 - 112 mEq/L   CO2 28  19 - 32 mEq/L   Glucose, Bld 234 (*) 70 - 99 mg/dL   BUN 7  6 - 23 mg/dL   Creatinine, Ser 0.33 (*) 0.50 - 1.10 mg/dL   Calcium 9.1  8.4 - 10.5 mg/dL   Total Protein 7.3  6.0 - 8.3 g/dL   Albumin 3.3 (*) 3.5 - 5.2 g/dL   AST 11  0 - 37 U/L   ALT 5  0 - 35 U/L   Alkaline Phosphatase 91  39 - 117 U/L   Total Bilirubin <0.2 (*) 0.3 - 1.2 mg/dL   GFR calc non Af Amer >90  >90 mL/min   GFR calc Af Amer >90  >90 mL/min   Comment: (NOTE)     The eGFR has been calculated using the CKD EPI equation.     This calculation has not been validated  in all clinical situations.     eGFR's persistently <90 mL/min signify possible Chronic Kidney     Disease.   Anion gap 12  5 - 15  CBC     Status: Abnormal   Collection Time    07/25/14  6:26 AM      Result Value Ref Range   WBC 9.0  4.0 - 10.5 K/uL   RBC 4.72  3.87 - 5.11 MIL/uL   Hemoglobin 13.4  12.0 - 15.0 g/dL   HCT 40.6  36.0 - 46.0 %   MCV 86.0  78.0 - 100.0 fL   MCH 28.4  26.0 - 34.0 pg   MCHC 33.0  30.0 - 36.0 g/dL   RDW 12.7  11.5 - 15.5 %   Platelets 405 (*) 150 - 400 K/uL  GLUCOSE, CAPILLARY     Status: Abnormal   Collection Time    07/25/14  7:30 AM      Result Value Ref Range   Glucose-Capillary 231 (*) 70 - 99 mg/dL   Comment 1 Notify RN      ABGS  Recent Labs  07/24/14 1545  PHART 7.393  PO2ART 86.4  TCO2 29.2  HCO3 32.4*   CULTURES No results found for this or any previous visit (from the past 240 hour(s)). Studies/Results: Dg Chest Port 1 View  07/24/2014   CLINICAL DATA:  Shortness of Breath  EXAM: PORTABLE CHEST - 1 VIEW  COMPARISON:  03/11/2014  FINDINGS: Cardiomediastinal silhouette is stable. Stable streaky right basilar atelectasis or scarring. No segmental infiltrate or pulmonary edema.  IMPRESSION: No segmental infiltrate or pulmonary edema. Stable streaky right basilar atelectasis or scarring.   Electronically Signed   By: Lahoma Crocker M.D.   On: 07/24/2014 16:38    Medications:  Prior to Admission:  Prescriptions prior to admission  Medication Sig Dispense Refill  . acetaminophen (TYLENOL) 500 MG tablet Take 500 mg by mouth every 6 (six) hours as needed for fever.       [name] albuterol (PROAIR HFA) 108 (90 BASE) MCG/ACT inhaler Inhale 2 puffs into the lungs every 6 (six) hours as needed. For shortness of breATH      . albuterol (PROVENTIL) (2.5 MG/3ML) 0.083% nebulizer solution Take 2.5 mg by nebulization every 4 (four) hours as needed. For shortness of breath       . budesonide-formoterol (SYMBICORT) 160-4.5 MCG/ACT  inhaler Inhale 2 puffs into  the lungs 2 (two) times daily.      [name] docusate sodium (COLACE) 100 MG capsule Take 200 mg by mouth every morning.      . fluticasone (FLONASE) 50 MCG/ACT nasal spray Place 2 sprays into the nose daily.      [name] lisinopril (PRINIVIL,ZESTRIL) 20 MG tablet Take 20 mg by mouth daily.      . methocarbamol (ROBAXIN) 500 MG tablet Take 500 mg by mouth 2 (two) times daily.      . OxyCODONE (OXYCONTIN) 10 mg T12A Take 1 tablet (10 mg total) by mouth every 12 (twelve) hours.  60 tablet  0  . oxyCODONE (ROXICODONE) 15 MG immediate release tablet Take 15 mg by mouth every 4 (four) hours as needed for pain.      [name] Oxymetazoline HCl (AFRIN NASAL SPRAY NA) Place 1 spray into the nose daily as needed (Congestion).       . pantoprazole (PROTONIX) 40 MG tablet Take 40 mg by mouth daily.      . pregabalin (LYRICA) 50 MG capsule Take 50 mg by mouth every 8 (eight) hours.       . promethazine (PHENERGAN) 25 MG tablet Take 25 mg by mouth every 6 (six) hours as needed. For nausea      . Simethicone (GAS-X EXTRA STRENGTH) 125 MG CAPS Take 2 capsules by mouth every morning.      . SUMAtriptan (IMITREX) 100 MG tablet Take 100 mg by mouth every 2 (two) hours as needed for migraine or headache. May repeat in 2 hours if headache persists or recurs.      [name] tiotropium (SPIRIVA) 18 MCG inhalation capsule Place 18 mcg into inhaler and inhale daily.       Scheduled: . budesonide-formoterol  2 puff Inhalation BID  . cefTRIAXone (ROCEPHIN)  IV  2 g Intravenous Q24H  . docusate sodium  200 mg Oral q morning - 10a  . fluticasone  2 spray Each Nare Daily  . heparin  5,000 Units Subcutaneous 3 times per day  . insulin aspart  0-20 Units Subcutaneous TID WC  . insulin aspart  0-5 Units Subcutaneous QHS  . lisinopril  20 mg Oral Daily  . methocarbamol  500 mg Oral BID  . methylPREDNISolone (SOLU-MEDROL) injection  125 mg Intravenous Q6H  . pantoprazole  40 mg Oral Daily  . pregabalin  50 mg Oral 3 times per day  . simethicone  160  mg Oral q morning - 10a  . tiotropium  18 mcg Inhalation Daily   Continuous: . sodium chloride 50 mL/hr at 07/24/14 1849   ZES:[id number], albuterol, ondansetron (ZOFRAN) IV, ondansetron, oxyCODONE, oxymetazoline, promethazine, SUMAtriptan  Assesment: She was admitted with COPD exacerbation. She says she has significant chronic stress which is acutely worse at home and she thinks that's been some of the problem. She feels strongly that she does not need to be in the hospital and she does appear to be back at approximately her baseline. Active Problems:   TOBACCO ABUSE   DM (diabetes mellitus)   COPD exacerbation    Plan: I'm going to give her Rocephin Zithromax and Solu-Medrol and discharge her. She will follow in my office tomorrow    LOS: 1 day   Vernona Peake L 07/25/2014, 8:03 AM

## 2014-07-25 NOTE — Progress Notes (Signed)
IV was removed, pt tolerated well. Discharge teaching was effective. Emphasis was placed on medication compliance and smoking cessation. Discussed distractions, stress management, and coping mechanisms. Pt identified playing hand held game and keeping items in hand as good distracters. Pt stated she will follow up with Dr. [name] in am. , Pt verbalizes understanding of discharge instructions, copy of discharge packet sent home with pt. Pt in stable condition upon discharge and left unit via wheelchair by RN.

## 2014-07-25 NOTE — Discharge Summary (Signed)
Physician Discharge Summary  Patient ID: [name] MRN: [id number] DOB/AGE: [birth year] 46 y.o. Primary Care Physician:Charmon Thorson L, MD Admit date: 07/24/2014 Discharge date: 07/25/2014    Discharge Diagnoses:   Active Problems:   TOBACCO ABUSE   DM (diabetes mellitus)   COPD exacerbation  chronic low back pain Anxiety Depression    Medication List         acetaminophen 500 MG tablet  Commonly known as:  TYLENOL  Take 500 mg by mouth every 6 (six) hours as needed for fever.     AFRIN NASAL SPRAY NA  Place 1 spray into the nose daily as needed (Congestion).     albuterol (2.5 MG/3ML) 0.083% nebulizer solution  Commonly known as:  PROVENTIL  Take 2.5 mg by nebulization every 4 (four) hours as needed. For shortness of breath     PROAIR HFA 108 (90 BASE) MCG/ACT inhaler  Generic drug:  albuterol  Inhale 2 puffs into the lungs every 6 (six) hours as needed. For shortness of breATH     azithromycin 250 MG tablet  Commonly known as:  ZITHROMAX Z-PAK  Take as directed on package     budesonide-formoterol 160-4.5 MCG/ACT inhaler  Commonly known as:  SYMBICORT  Inhale 2 puffs into the lungs 2 (two) times daily.     cefUROXime 500 MG tablet  Commonly known as:  CEFTIN  Take 1 tablet (500 mg total) by mouth 2 (two) times daily with a meal.     docusate sodium 100 MG capsule  Commonly known as:  COLACE  Take 200 mg by mouth every morning.     fluticasone 50 MCG/ACT nasal spray  Commonly known as:  FLONASE  Place 2 sprays into the nose daily.     GAS-X EXTRA STRENGTH 125 MG Caps  Generic drug:  Simethicone  Take 2 capsules by mouth every morning.     lisinopril 20 MG tablet  Commonly known as:  PRINIVIL,ZESTRIL  Take 20 mg by mouth daily.     methocarbamol 500 MG tablet  Commonly known as:  ROBAXIN  Take 500 mg by mouth 2 (two) times daily.     OxyCODONE 10 mg T12a 12 hr tablet  Commonly known as:  OXYCONTIN  Take 1 tablet (10 mg total) by mouth every 12  (twelve) hours.     oxyCODONE 15 MG immediate release tablet  Commonly known as:  ROXICODONE  Take 15 mg by mouth every 4 (four) hours as needed for pain.     pantoprazole 40 MG tablet  Commonly known as:  PROTONIX  Take 40 mg by mouth daily.     predniSONE 10 MG tablet  Commonly known as:  DELTASONE  Take daily for 3 days then 3 day for 3 days then daily for 3 days then one daily for 3 days then stop     pregabalin 50 MG capsule  Commonly known as:  LYRICA  Take 50 mg by mouth every 8 (eight) hours.     promethazine 25 MG tablet  Commonly known as:  PHENERGAN  Take 25 mg by mouth every 6 (six) hours as needed. For nausea     SUMAtriptan 100 MG tablet  Commonly known as:  IMITREX  Take 100 mg by mouth every 2 (two) hours as needed for migraine or headache. May repeat in 2 hours if headache persists or recurs.     tiotropium 18 MCG inhalation capsule  Commonly known as:  SPIRIVA  Place 18 mcg into inhaler  and inhale daily.        Discharged Condition: Improved    Consults: None  Significant Diagnostic Studies: Dg Chest Port 1 View  07/24/2014   CLINICAL DATA:  Shortness of Breath  EXAM: PORTABLE CHEST - 1 VIEW  COMPARISON:  03/11/2014  FINDINGS: Cardiomediastinal silhouette is stable. Stable streaky right basilar atelectasis or scarring. No segmental infiltrate or pulmonary edema.  IMPRESSION: No segmental infiltrate or pulmonary edema. Stable streaky right basilar atelectasis or scarring.   Electronically Signed   By: Natasha Mead M.D.   On: 07/24/2014 16:38   Ct Maxillofacial Wo Cm  06/26/2014   CLINICAL DATA:  48 year old female with sinusitis worse on the left. Bilateral orbit pressure, left ear pain. Remote right ear surgery. Initial encounter.  EXAM: CT MAXILLOFACIAL WITHOUT CONTRAST  TECHNIQUE: Multidetector CT imaging of the maxillofacial structures was performed. Multiplanar CT image reconstructions were also generated. A small metallic BB was placed on the right  temple in order to reliably differentiate right from left.  COMPARISON:  Head CTs without contrast 07/27/2009 and earlier.  FINDINGS: Visible non contrast brain parenchyma appear stable and within normal limits. Visualized orbit soft tissues are within normal limits. Visualized scalp soft tissues are within normal limits. Negative visualized deep soft tissue spaces of the face.  Stable sequelae of right canal down mastoidectomy. Right side ossicles not clearly identified. Left tympanic cavity and mastoids are clear.  Sphenoid sinuses remain clear.  Ethmoid air cells remain clear.  Frontal sinuses remain clear.  Mild mucosal thickening in both maxillary sinuses, most pronounced at the alveolar recesses. Ostial mucosal thickening greater on the left. Minimal bubbly opacity in both maxillary sinuses. Since 2010, maxillary sinus aeration has improved (moderate fluid levels at that time).  Leftward nasal septal deviation and spurring. 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  IMPRESSION: 1. Chronic maxillary sinusitis, currently with mild mucosal thickening and minimal bubbly opacity. Ostial mucosal thickening greater on the left. 2. Other paranasal sinuses are stable and clear. 3. Remote right mastoidectomy.   Electronically Signed   By: Augusto Gamble M.D.   On: 06/26/2014 09:04    Lab Results: Basic Metabolic Panel:  Recent Labs  16/10/96 1527 07/25/14 0626  NA 139 141  K 3.1* 4.0  CL 98 101  CO2 33* 28  GLUCOSE 182* 234*  BUN 6 7  CREATININE 0.46* 0.33*  CALCIUM 8.8 9.1   Liver Function Tests:  Recent Labs  07/25/14 0626  AST 11  ALT 5  ALKPHOS 91  BILITOT <0.2*  PROT 7.3  ALBUMIN 3.3*     CBC:  Recent Labs  07/24/14 1527 07/25/14 0626  WBC 9.4 9.0  NEUTROABS 6.5  --   HGB 13.0 13.4  HCT 39.3 40.6  MCV 86.6 86.0  PLT 371 405*    No results found for this or any previous visit (from the past 240 hour(s)).   Hospital Course: This is a 48 year old who  came to the emergency department because of increased problems with shortness of breath cough and congestion. She was treated in the emergency department and improved it was felt that she was going to need to be in the hospital for further treatment. When she was evaluated the next morning she was back at baseline and wanted to go home and finish treatment as an outpatient in my office.  Discharge Exam: Blood pressure 115/70, pulse 88, temperature 98.4 F (36.9 C), temperature source Oral, resp. rate 20, height 5' (1.524 m),  weight 68.947 kg (152 lb), SpO2 93.00%. She is awake and alert. She appears somewhat anxious. Her chest shows rhonchi bilaterally but her chest is about as clear as she gets. Her heart is regular. Her abdomen is soft.  Disposition: Home      Discharge Instructions   Discharge patient    Complete by:  As directed   After Rocephin, Zithromax and Solu-Medrol given this morning             Signed: Minnie Legros L   07/25/2014, 8:11 AM

## 2014-07-25 NOTE — Care Management Note (Signed)
    Page 1 of 1   07/25/2014     9:23:59 AM CARE MANAGEMENT NOTE 07/25/2014  Patient:  [name], [name]   Account Number:  [id number]  Date Initiated:  07/25/2014  Documentation initiated by:  CHILDRESS,JESSICA  Subjective/Objective Assessment:   Pt is from home with self care. Pt lives with spouse. Pt has home O2 from [facility] and [name] neb machine from [employer]. Pt has no HH services or medication needs at this time.     Action/Plan:   Pt plans to discharge home today with close follow up from PCP. Pt has no CM needs at this time.   Anticipated DC Date:  07/25/2014   Anticipated DC Plan:  HOME/SELF CARE      DC Planning Services  CM consult      Choice offered to / List presented to:             Status of service:  Completed, signed off Medicare Important Message given?   (If response is "NO", the following Medicare IM given date fields will be blank) Date Medicare IM given:   Medicare IM given by:   Date Additional Medicare IM given:   Additional Medicare IM given by:    Discharge Disposition:  HOME/SELF CARE  Per UR Regulation:    If discussed at Long Length of Stay Meetings, dates discussed:    Comments:  07/25/2014 0900 Kathyrn Sheriff, RN, MSN, [facility]

## 2014-07-29 LAB — CULTURE, BLOOD (ROUTINE X 2)
Culture: NO GROWTH
Culture: NO GROWTH

## 2014-08-14 ENCOUNTER — Inpatient Hospital Stay (HOSPITAL_COMMUNITY)
Admission: EM | Admit: 2014-08-14 | Discharge: 2014-08-15 | DRG: 189 | Discharge status: Against Medical Advice | Payer: Medicaid Other | Attending: Pulmonary Disease | Admitting: Pulmonary Disease

## 2014-08-14 ENCOUNTER — Encounter (HOSPITAL_COMMUNITY): Payer: Self-pay | Admitting: Emergency Medicine

## 2014-08-14 ENCOUNTER — Emergency Department (HOSPITAL_COMMUNITY): Payer: Medicaid Other

## 2014-08-14 DIAGNOSIS — E119 Type 2 diabetes mellitus without complications: Secondary | ICD-10-CM | POA: Diagnosis present

## 2014-08-14 DIAGNOSIS — J45901 Unspecified asthma with (acute) exacerbation: Secondary | ICD-10-CM

## 2014-08-14 DIAGNOSIS — E872 Acidosis, unspecified: Secondary | ICD-10-CM | POA: Diagnosis present

## 2014-08-14 DIAGNOSIS — E871 Hypo-osmolality and hyponatremia: Secondary | ICD-10-CM | POA: Diagnosis present

## 2014-08-14 DIAGNOSIS — Z9981 Dependence on supplemental oxygen: Secondary | ICD-10-CM | POA: Diagnosis not present

## 2014-08-14 DIAGNOSIS — J9600 Acute respiratory failure, unspecified whether with hypoxia or hypercapnia: Principal | ICD-10-CM | POA: Diagnosis present

## 2014-08-14 DIAGNOSIS — E8729 Other acidosis: Secondary | ICD-10-CM

## 2014-08-14 DIAGNOSIS — Z79899 Other long term (current) drug therapy: Secondary | ICD-10-CM

## 2014-08-14 DIAGNOSIS — J441 Chronic obstructive pulmonary disease with (acute) exacerbation: Secondary | ICD-10-CM | POA: Diagnosis present

## 2014-08-14 DIAGNOSIS — G8929 Other chronic pain: Secondary | ICD-10-CM | POA: Diagnosis present

## 2014-08-14 DIAGNOSIS — M549 Dorsalgia, unspecified: Secondary | ICD-10-CM | POA: Diagnosis present

## 2014-08-14 DIAGNOSIS — J9620 Acute and chronic respiratory failure, unspecified whether with hypoxia or hypercapnia: Secondary | ICD-10-CM

## 2014-08-14 DIAGNOSIS — N179 Acute kidney failure, unspecified: Secondary | ICD-10-CM | POA: Diagnosis present

## 2014-08-14 DIAGNOSIS — Z23 Encounter for immunization: Secondary | ICD-10-CM | POA: Diagnosis not present

## 2014-08-14 DIAGNOSIS — K219 Gastro-esophageal reflux disease without esophagitis: Secondary | ICD-10-CM | POA: Diagnosis present

## 2014-08-14 DIAGNOSIS — E875 Hyperkalemia: Secondary | ICD-10-CM | POA: Diagnosis present

## 2014-08-14 DIAGNOSIS — R400 Somnolence: Secondary | ICD-10-CM

## 2014-08-14 DIAGNOSIS — F17200 Nicotine dependence, unspecified, uncomplicated: Secondary | ICD-10-CM | POA: Diagnosis present

## 2014-08-14 DIAGNOSIS — J9622 Acute and chronic respiratory failure with hypercapnia: Secondary | ICD-10-CM

## 2014-08-14 DIAGNOSIS — R4182 Altered mental status, unspecified: Secondary | ICD-10-CM | POA: Diagnosis present

## 2014-08-14 LAB — COMPREHENSIVE METABOLIC PANEL
ALBUMIN: 3.9 g/dL (ref 3.5–5.2)
ALK PHOS: 70 U/L (ref 39–117)
ALT: 9 U/L (ref 0–35)
ANION GAP: 13 (ref 5–15)
AST: 14 U/L (ref 0–37)
BUN: 30 mg/dL — AB (ref 6–23)
CHLORIDE: 91 meq/L — AB (ref 96–112)
CO2: 26 mEq/L (ref 19–32)
Calcium: 9.4 mg/dL (ref 8.4–10.5)
Creatinine, Ser: 1.14 mg/dL — ABNORMAL HIGH (ref 0.50–1.10)
GFR calc Af Amer: 66 mL/min — ABNORMAL LOW (ref >90)
GFR, EST NON AFRICAN AMERICAN: 57 mL/min — AB (ref >90)
Glucose, Bld: 117 mg/dL — ABNORMAL HIGH (ref 70–99)
Potassium: 6.5 mEq/L (ref 3.7–5.3)
Sodium: 130 mEq/L — ABNORMAL LOW (ref 137–147)
Total Protein: 7.2 g/dL (ref 6.0–8.3)

## 2014-08-14 LAB — POCT I-STAT 3, ART BLOOD GAS (G3+)
Acid-Base Excess: 4 mmol/L — ABNORMAL HIGH (ref 0.0–2.0)
BICARBONATE: 31 meq/L — AB (ref 20.0–24.0)
O2 SAT: 88 %
PCO2 ART: 54.7 mmHg — AB (ref 35.0–45.0)
PO2 ART: 58 mmHg — AB (ref 80.0–100.0)
Patient temperature: 98.7
TCO2: 33 mmol/L (ref 0–100)
pH, Arterial: 7.362 (ref 7.350–7.450)

## 2014-08-14 LAB — I-STAT ARTERIAL BLOOD GAS, ED
ACID-BASE EXCESS: 3 mmol/L — AB (ref 0.0–2.0)
BICARBONATE: 33 meq/L — AB (ref 20.0–24.0)
O2 Saturation: 91 %
PH ART: 7.278 — AB (ref 7.350–7.450)
TCO2: 35 mmol/L (ref 0–100)
pCO2 arterial: 70.5 mmHg (ref 35.0–45.0)
pO2, Arterial: 71 mmHg — ABNORMAL LOW (ref 80.0–100.0)

## 2014-08-14 LAB — BASIC METABOLIC PANEL
Anion gap: 9 (ref 5–15)
BUN: 19 mg/dL (ref 6–23)
CHLORIDE: 94 meq/L — AB (ref 96–112)
CO2: 30 meq/L (ref 19–32)
CREATININE: 0.7 mg/dL (ref 0.50–1.10)
Calcium: 9.6 mg/dL (ref 8.4–10.5)
GFR calc Af Amer: >90 mL/min (ref >90)
GFR calc non Af Amer: >90 mL/min (ref >90)
Glucose, Bld: 223 mg/dL — ABNORMAL HIGH (ref 70–99)
Potassium: 5.7 mEq/L — ABNORMAL HIGH (ref 3.7–5.3)
Sodium: 133 mEq/L — ABNORMAL LOW (ref 137–147)

## 2014-08-14 LAB — I-STAT CHEM 8, ED
BUN: 39 mg/dL — ABNORMAL HIGH (ref 6–23)
CALCIUM ION: 1.2 mmol/L (ref 1.12–1.23)
CHLORIDE: 95 meq/L — AB (ref 96–112)
Creatinine, Ser: 1.3 mg/dL — ABNORMAL HIGH (ref 0.50–1.10)
GLUCOSE: 152 mg/dL — AB (ref 70–99)
HEMATOCRIT: 51 % — AB (ref 36.0–46.0)
Hemoglobin: 17.3 g/dL — ABNORMAL HIGH (ref 12.0–15.0)
Potassium: 6.2 mEq/L — ABNORMAL HIGH (ref 3.7–5.3)
Sodium: 128 mEq/L — ABNORMAL LOW (ref 137–147)
TCO2: 34 mmol/L (ref 0–100)

## 2014-08-14 LAB — CBC WITH DIFFERENTIAL/PLATELET
Basophils Absolute: 0 10*3/uL (ref 0.0–0.1)
Basophils Relative: 0 % (ref 0–1)
EOS ABS: 0.1 10*3/uL (ref 0.0–0.7)
Eosinophils Relative: 1 % (ref 0–5)
HCT: 44 % (ref 36.0–46.0)
Hemoglobin: 14 g/dL (ref 12.0–15.0)
Lymphocytes Relative: 25 % (ref 12–46)
Lymphs Abs: 2.8 10*3/uL (ref 0.7–4.0)
MCH: 28.7 pg (ref 26.0–34.0)
MCHC: 31.8 g/dL (ref 30.0–36.0)
MCV: 90.2 fL (ref 78.0–100.0)
MONOS PCT: 10 % (ref 3–12)
Monocytes Absolute: 1.1 10*3/uL — ABNORMAL HIGH (ref 0.1–1.0)
NEUTROS PCT: 64 % (ref 43–77)
Neutro Abs: 7.3 10*3/uL (ref 1.7–7.7)
Platelets: 273 10*3/uL (ref 150–400)
RBC: 4.88 MIL/uL (ref 3.87–5.11)
RDW: 13.6 % (ref 11.5–15.5)
WBC: 11.2 10*3/uL — ABNORMAL HIGH (ref 4.0–10.5)

## 2014-08-14 LAB — GLUCOSE, CAPILLARY: Glucose-Capillary: 200 mg/dL — ABNORMAL HIGH (ref 70–99)

## 2014-08-14 LAB — I-STAT TROPONIN, ED: TROPONIN I, POC: 0 ng/mL (ref 0.00–0.08)

## 2014-08-14 LAB — CBG MONITORING, ED: Glucose-Capillary: 174 mg/dL — ABNORMAL HIGH (ref 70–99)

## 2014-08-14 LAB — MRSA PCR SCREENING: MRSA BY PCR: NEGATIVE

## 2014-08-14 MED ORDER — INSULIN ASPART 100 UNIT/ML ~~LOC~~ SOLN
0.0000 [IU] | SUBCUTANEOUS | Status: DC
  Administered 2014-08-14 – 2014-08-15 (×3): 3 [IU] via SUBCUTANEOUS
  Administered 2014-08-15: 11 [IU] via SUBCUTANEOUS

## 2014-08-14 MED ORDER — INSULIN ASPART 100 UNIT/ML IV SOLN
10.0000 [IU] | Freq: Once | INTRAVENOUS | Status: AC
  Administered 2014-08-14: 10 [IU] via INTRAVENOUS
  Filled 2014-08-14: qty 0.1

## 2014-08-14 MED ORDER — HEPARIN SODIUM (PORCINE) 5000 UNIT/ML IJ SOLN
5000.0000 [IU] | Freq: Three times a day (TID) | INTRAMUSCULAR | Status: DC
  Administered 2014-08-14 – 2014-08-15 (×2): 5000 [IU] via SUBCUTANEOUS
  Filled 2014-08-14 (×5): qty 1

## 2014-08-14 MED ORDER — METHYLPREDNISOLONE SODIUM SUCC 125 MG IJ SOLR: 60.0000 mg | Freq: Four times a day (QID) | INTRAMUSCULAR | Status: DC

## 2014-08-14 MED ORDER — INFLUENZA VAC SPLIT QUAD 0.5 ML IM SUSY
0.5000 mL | PREFILLED_SYRINGE | INTRAMUSCULAR | Status: AC
  Administered 2014-08-15: 0.5 mL via INTRAMUSCULAR
  Filled 2014-08-14: qty 0.5

## 2014-08-14 MED ORDER — IPRATROPIUM-ALBUTEROL 0.5-2.5 (3) MG/3ML IN SOLN
3.0000 mL | Freq: Four times a day (QID) | RESPIRATORY_TRACT | Status: DC
  Administered 2014-08-14 – 2014-08-15 (×3): 3 mL via RESPIRATORY_TRACT
  Filled 2014-08-14 (×3): qty 3

## 2014-08-14 MED ORDER — IPRATROPIUM BROMIDE 0.02 % IN SOLN
0.5000 mg | Freq: Once | RESPIRATORY_TRACT | Status: AC
  Administered 2014-08-14: 0.5 mg via RESPIRATORY_TRACT
  Filled 2014-08-14: qty 2.5

## 2014-08-14 MED ORDER — SODIUM CHLORIDE 0.9 % IV SOLN: 250.0000 mL | INTRAVENOUS | Status: DC | PRN

## 2014-08-14 MED ORDER — SODIUM CHLORIDE 0.9 % IV SOLN
INTRAVENOUS | Status: DC
  Administered 2014-08-14: 19:00:00 via INTRAVENOUS

## 2014-08-14 MED ORDER — PANTOPRAZOLE SODIUM 40 MG IV SOLR
40.0000 mg | INTRAVENOUS | Status: DC
  Administered 2014-08-14: 40 mg via INTRAVENOUS
  Filled 2014-08-14 (×2): qty 40

## 2014-08-14 MED ORDER — METHYLPREDNISOLONE SODIUM SUCC 125 MG IJ SOLR
60.0000 mg | Freq: Four times a day (QID) | INTRAMUSCULAR | Status: DC
  Administered 2014-08-14 – 2014-08-15 (×2): 60 mg via INTRAVENOUS
  Filled 2014-08-14 (×2): qty 0.96
  Filled 2014-08-14: qty 2
  Filled 2014-08-14 (×4): qty 0.96

## 2014-08-14 MED ORDER — LORAZEPAM 2 MG/ML IJ SOLN
INTRAMUSCULAR | Status: AC
  Administered 2014-08-14: 2 mg via INTRAVENOUS
  Filled 2014-08-14: qty 1

## 2014-08-14 MED ORDER — ALBUTEROL SULFATE (2.5 MG/3ML) 0.083% IN NEBU: 2.5000 mg | INHALATION_SOLUTION | RESPIRATORY_TRACT | Status: DC | PRN

## 2014-08-14 MED ORDER — CALCIUM GLUCONATE 10 % IV SOLN
1.0000 g | Freq: Once | INTRAVENOUS | Status: AC
  Administered 2014-08-14: 1 g via INTRAVENOUS
  Filled 2014-08-14: qty 10

## 2014-08-14 MED ORDER — SODIUM CHLORIDE 0.9 % IV SOLN
INTRAVENOUS | Status: DC | PRN
  Administered 2014-08-14: 23:00:00 via INTRAVENOUS

## 2014-08-14 MED ORDER — PREGABALIN 50 MG PO CAPS
50.0000 mg | ORAL_CAPSULE | Freq: Once | ORAL | Status: AC
  Administered 2014-08-14: 50 mg via ORAL

## 2014-08-14 MED ORDER — METHYLPREDNISOLONE SODIUM SUCC 125 MG IJ SOLR
125.0000 mg | Freq: Once | INTRAMUSCULAR | Status: AC
  Administered 2014-08-14: 125 mg via INTRAVENOUS
  Filled 2014-08-14: qty 2

## 2014-08-14 MED ORDER — DEXTROSE 5 % IV SOLN
500.0000 mg | INTRAVENOUS | Status: DC
  Administered 2014-08-14: 500 mg via INTRAVENOUS
  Filled 2014-08-14 (×2): qty 500

## 2014-08-14 MED ORDER — PREGABALIN 50 MG PO CAPS
50.0000 mg | ORAL_CAPSULE | Freq: Three times a day (TID) | ORAL | Status: DC
  Administered 2014-08-14 – 2014-08-15 (×2): 50 mg via ORAL
  Filled 2014-08-14 (×3): qty 1

## 2014-08-14 MED ORDER — ALPRAZOLAM 0.5 MG PO TABS
0.5000 mg | ORAL_TABLET | Freq: Three times a day (TID) | ORAL | Status: DC | PRN
  Administered 2014-08-14: 0.5 mg via ORAL
  Filled 2014-08-14: qty 1

## 2014-08-14 MED ORDER — IPRATROPIUM-ALBUTEROL 0.5-2.5 (3) MG/3ML IN SOLN
3.0000 mL | Freq: Once | RESPIRATORY_TRACT | Status: AC
  Administered 2014-08-14: 3 mL via RESPIRATORY_TRACT
  Filled 2014-08-14: qty 3

## 2014-08-14 MED ORDER — DEXTROSE 5 % IV SOLN
500.0000 mg | Freq: Once | INTRAVENOUS | Status: AC
  Administered 2014-08-14: 500 mg via INTRAVENOUS
  Filled 2014-08-14: qty 500

## 2014-08-14 MED ORDER — LORAZEPAM 2 MG/ML IJ SOLN
2.0000 mg | Freq: Once | INTRAMUSCULAR | Status: AC
  Administered 2014-08-14: 2 mg via INTRAVENOUS

## 2014-08-14 MED ORDER — DEXTROSE 50 % IV SOLN
1.0000 | Freq: Once | INTRAVENOUS | Status: AC
  Administered 2014-08-14: 50 mL via INTRAVENOUS
  Filled 2014-08-14: qty 50

## 2014-08-14 MED ORDER — CETYLPYRIDINIUM CHLORIDE 0.05 % MT LIQD: 7.0000 mL | Freq: Two times a day (BID) | OROMUCOSAL | Status: DC

## 2014-08-14 MED ORDER — ALBUTEROL SULFATE (2.5 MG/3ML) 0.083% IN NEBU
5.0000 mg | INHALATION_SOLUTION | Freq: Once | RESPIRATORY_TRACT | Status: AC
  Administered 2014-08-14: 5 mg via RESPIRATORY_TRACT
  Filled 2014-08-14: qty 6

## 2014-08-14 MED ORDER — CHLORHEXIDINE GLUCONATE 0.12 % MT SOLN
15.0000 mL | Freq: Two times a day (BID) | OROMUCOSAL | Status: DC
  Administered 2014-08-14: 15 mL via OROMUCOSAL
  Filled 2014-08-14: qty 15

## 2014-08-14 NOTE — H&P (Addendum)
PULMONARY / CRITICAL CARE MEDICINE   Name: [name] MRN: [id number] DOB: Mar 07, 1966    ADMISSION DATE:  08/14/2014 CONSULTATION DATE:  08/14/2014  REFERRING MD :  EDP  CHIEF COMPLAINT:  SOB  INITIAL PRESENTATION: 48 y.o. F brought to [facility] ED on 9/22 after family found her to be altered and minimally responsive.  In ED, she was found to have hypercarbic respiratory failure with an acute exacerbation of COPD.  She was started on BiPAP and PCCM was consulted. PMH - old tstomy, COPD on home O2, chronic pain on MS contin & BDZ  STUDIES:  CXR 9/22 >>> negative  SIGNIFICANT EVENTS: 9/22 - admit, started on BiPAP   HISTORY OF PRESENT ILLNESS:  [name] is a 48 y.o. F with PMH as outlined below including COPD (reportedly 3L O2 dependent).  Of note, she was recently admitted to [facility] on 9/1 with SOB secondary to COPD exacerbation. She presented to the [facility] ED on 9/22 after her family found her altered and minimally responsive earlier in the day.  Per her daughters, pt has been acting a little strange and "out of it" since her admission earlier this month.  On day of presentation, in addition to being minimally responsive, she was noted to have a "blank stare" which prompted family to dispatch EMS. Daughters report that pt has not had any recent fevers/chills/sweats, chest pain, SOB, cough, N/V/D, abdominal pain.  No exposure to known sick contacts.  No recent travel. She does however continue to smoke despite multiple medical providers as well as family members strongly encouraging her to quit. Of note, pt is on multiple pain meds as outpatient.  Some of her pill bottles are empty; she reports that they are in her pill box at home.  PAST MEDICAL HISTORY :  Past Medical History  Diagnosis Date  . Migraine headache   . On home O2   . COPD (chronic obstructive pulmonary disease)   . Chronic back pain   . Hyperglycemia, drug-induced     steroid induced hyperglycemia  . ARDS (adult respiratory  distress syndrome)     Jan 2011  . Diabetes mellitus   . Pneumonia   . Angina   . Asthma   . Shortness of breath   . Recurrent upper respiratory infection (URI)   . GERD (gastroesophageal reflux disease) 12/21/2012   Past Surgical History  Procedure Laterality Date  . Tubal ligation    . Uterine ablasion    . C-section    . Tracheostomy      decannulated 12/2009   Prior to Admission medications   Medication Sig Start Date End Date Taking? Authorizing Provider  acetaminophen (TYLENOL) 500 MG tablet Take 500 mg by mouth every 6 (six) hours as needed for fever.    Yes Historical Provider, MD  albuterol (PROAIR HFA) 108 (90 BASE) MCG/ACT inhaler Inhale 2 puffs into the lungs every 6 (six) hours as needed. For shortness of breATH   Yes Historical Provider, MD  albuterol (PROVENTIL) (2.5 MG/3ML) 0.083% nebulizer solution Take 2.5 mg by nebulization every 4 (four) hours as needed. For shortness of breath  03/19/11  Yes Lounell S Parrett, NP  ALPRAZolam (XANAX) 0.5 MG tablet Take 0.5 mg by mouth 3 (three) times daily as needed for anxiety.   Yes Historical Provider, MD  budesonide-formoterol (SYMBICORT) 160-4.5 MCG/ACT inhaler Inhale 2 puffs into the lungs 2 (two) times daily.   Yes Historical Provider, MD  clonazePAM (KLONOPIN) 0.5 MG tablet Take 0.5 mg  by mouth 3 (three) times daily as needed for anxiety.   Yes Historical Provider, MD  docusate sodium (COLACE) 100 MG capsule Take 200 mg by mouth every morning.   Yes Historical Provider, MD  fluticasone (FLONASE) 50 MCG/ACT nasal spray Place 2 sprays into the nose daily.   Yes Historical Provider, MD  lisinopril (PRINIVIL,ZESTRIL) 20 MG tablet Take 20 mg by mouth daily.   Yes Historical Provider, MD  methocarbamol (ROBAXIN) 500 MG tablet Take 500 mg by mouth 2 (two) times daily.   Yes Historical Provider, MD  OxyCODONE (OXYCONTIN) 10 mg T12A Take 1 tablet (10 mg total) by mouth every 12 (twelve) hours. 04/24/13  Yes Fredirick Maudlin, MD   Oxymetazoline HCl (AFRIN NASAL SPRAY NA) Place 1 spray into the nose daily as needed (Congestion).    Yes Historical Provider, MD  pregabalin (LYRICA) 50 MG capsule Take 50 mg by mouth every 8 (eight) hours.    Yes Historical Provider, MD  promethazine (PHENERGAN) 25 MG tablet Take 25 mg by mouth every 6 (six) hours as needed for nausea.    Yes Historical Provider, MD  sulfamethoxazole-trimethoprim (BACTRIM DS) 800-160 MG per tablet Take 1 tablet by mouth 2 (two) times daily.   Yes Historical Provider, MD  SUMAtriptan (IMITREX) 100 MG tablet Take 100 mg by mouth every 2 (two) hours as needed for migraine or headache. May repeat in 2 hours if headache persists or recurs.   Yes Historical Provider, MD  tiotropium (SPIRIVA) 18 MCG inhalation capsule Place 18 mcg into inhaler and inhale daily.   Yes Historical Provider, MD   Allergies  Allergen Reactions  . Penicillins Other (See Comments)    convulsions  . Levaquin [Levofloxacin In D5w] Itching and Swelling  . Morphine Nausea Only    FAMILY HISTORY:  Family History  Problem Relation Age of Onset  . Coronary artery disease Brother   . Diabetes Other   . Cancer Other   . Hypertension Other    SOCIAL HISTORY:  reports that she has been smoking Cigarettes.  She has a 8.25 pack-year smoking history. She does not have any smokeless tobacco history on file. She reports that she does not drink alcohol or use illicit drugs.  REVIEW OF SYSTEMS:  All negative; except for those that are bolded, which indicate positives.  Constitutional: weight loss, weight gain, night sweats, fevers, chills, fatigue, weakness.  HEENT: headaches, sore throat, sneezing, nasal congestion, post nasal drip, difficulty swallowing, tooth/dental problems, visual complaints, visual changes, ear aches. Neuro: difficulty with speech, weakness, numbness, ataxia. CV:  chest pain, orthopnea, PND, swelling in lower extremities, dizziness, palpitations, syncope.  Resp: cough,  hemoptysis, dyspnea, wheezing. GI  heartburn, indigestion, abdominal pain, nausea, vomiting, diarrhea, constipation, change in bowel habits, loss of appetite, hematemesis, melena, hematochezia.  GU: dysuria, change in color of urine, urgency or frequency, flank pain, hematuria. MSK: joint pain or swelling, decreased range of motion. Psych: change in mood or affect, depression, anxiety, suicidal ideations, homicidal ideations. Skin: rash, itching, bruising.   SUBJECTIVE:   VITAL SIGNS: Temp:  [99 F (37.2 C)] 99 F (37.2 C) (09/22 1522) Pulse Rate:  [105-113] 113 (09/22 1730) Resp:  [17-20] 19 (09/22 1730) BP: (101-127)/(59-107) 127/107 mmHg (09/22 1730) SpO2:  [93 %-99 %] 95 % (09/22 1730) FiO2 (%):  [30 %] 30 % (09/22 1706) HEMODYNAMICS:   VENTILATOR SETTINGS: Vent Mode:  [-]  FiO2 (%):  [30 %] 30 % INTAKE / OUTPUT: Intake/Output   None  PHYSICAL EXAMINATION: General: Ill appearing female, in NAD. Neuro: A&O x 3, non-focal.  HEENT: [place]/AT. PERRL, sclerae anicteric.  BiPAP in place. Cardiovascular: RRR, no M/R/G.  Lungs: Respirations shallow.  Diffuse expiratory wheeze.  On BiPAP. Abdomen: BS x 4, soft, NT/ND.  Musculoskeletal: No gross deformities, no edema.  Skin: Intact, warm, no rashes.  LABS:  CBC  Recent Labs Lab 08/14/14 1619 08/14/14 1757  WBC 11.2*  --   HGB 14.0 17.3*  HCT 44.0 51.0*  PLT 273  --    Coag's No results found for this basename: APTT, INR,  in the last 168 hours BMET  Recent Labs Lab 08/14/14 1635 08/14/14 1757  NA 130* 128*  K 6.5* 6.2*  CL 91* 95*  CO2 26  --   BUN 30* 39*  CREATININE 1.14* 1.30*  GLUCOSE 117* 152*   Electrolytes  Recent Labs Lab 08/14/14 1635  CALCIUM 9.4   Sepsis Markers No results found for this basename: LATICACIDVEN, PROCALCITON, O2SATVEN,  in the last 168 hours ABG  Recent Labs Lab 08/14/14 1615  PHART 7.278*  PCO2ART 70.5*  PO2ART 71.0*   Liver Enzymes  Recent Labs Lab  08/14/14 1635  AST 14  ALT 9  ALKPHOS 70  BILITOT <0.2*  ALBUMIN 3.9   Cardiac Enzymes No results found for this basename: TROPONINI, PROBNP,  in the last 168 hours Glucose  Recent Labs Lab 08/14/14 1603  GLUCAP 174*    Imaging Dg Chest 2 View  08/14/2014   CLINICAL DATA:  Altered mental status, difficulty breathing  EXAM: CHEST  2 VIEW  COMPARISON:  07/24/2014 and 03/11/2014  FINDINGS: Cardiomediastinal silhouette is stable. No acute infiltrate or pulmonary edema. Mild perihilar and infrahilar bronchitic changes. Bony thorax is unremarkable.  IMPRESSION: No acute infiltrate or pulmonary edema. Mild perihilar infrahilar bronchitic changes.   Electronically Signed   By: Natasha Mead M.D.   On: 08/14/2014 16:57    ASSESSMENT / PLAN:  PULMONARY A: AECOPD Acute on chronic hypercarbic and hypoxic respiratory failure Respiratory acidosis - due to above +/- respiratory depression from multiple pain meds. Tobacco use disorder P:   Continuous BiPAP for now, if fails or can not tolerate then will need to consider intubation. Solumedrol  q6hrs. DuoNebs / Albuterol. Azithromycin. F/u ABG at 10pm. Tobacco cessation. Hold outpatient symbicort, afrin, spiriva albuterol hfa.  CARDIOVASCULAR A:  Hx HTN P:  Hold outpatient lisinopril.  RENAL A:   Hyponatremia Hyperkalemia - 6.5 on admit, received Ca gluconate / insulin / D50 while in ED - likely due to acidosis, ACE I + bactrim. AKI P:   NS @ 75. Repeat BMP at 2100. BMP in AM.  GASTROINTESTINAL A:   Nutrition GERD P:   NPO for now. Pantoprazole.  HEMATOLOGIC A:   VTE Prophylaxis P:  SCD's / Heparin. CBC in AM.  INFECTIOUS A:   AECOPD P:   Continue azithromycin empirically. Will not send cultures as abx have already been started.  ENDOCRINE A:   DM P:   CBG's q4hr. SSI.  NEUROLOGIC A:   Hx chronic back pain P:   Hold outpatient xanax, klonopin, robaxin, oxycontin, lyrica.   TODAY'S SUMMARY:  48 y.o. F, ongoing tobacco abuse, presents with AECOPD.  Started on BiPAP / steroids/ BD's.  Anticipate turn around with BiPAP (already improving since starting).  If improves overnight, can likely move out of ICU in AM.   Rutherford Guys, PA - [facility] Pgr: [phone]  or [phone] 08/14/2014, 6:26 PM   Attending note - Independently examined pt, evaluated data & formulated above care plan with NP. Improving mental status, bronchospasm suggests that this is more of COPD flare rather than narcotic or benzo overdose. She denies overdosing on medications. Hyperkalemia related to acidosis, ACE I & bactrim & expect to improve .Updated daughters  I have personally obtained a history, examined the patient, evaluated laboratory and imaging results, formulated the assessment and plan and placed orders. CRITICAL CARE: The patient is critically ill with multiple organ systems failure and requires high complexity decision making for assessment and support, frequent evaluation and titration of therapies, application of advanced monitoring technologies and extensive interpretation of multiple databases. Critical Care Time devoted to patient care services described in this note is 50 minutes.    Oretha Milch MD

## 2014-08-14 NOTE — ED Notes (Signed)
Critical lab result called to EDP

## 2014-08-14 NOTE — ED Notes (Signed)
MD at bedside. 

## 2014-08-14 NOTE — ED Provider Notes (Signed)
CSN: [id number]     Arrival date & time 08/14/14  1512 History   First MD Initiated Contact with Patient 08/14/14 1536     Chief Complaint  Patient presents with  . Altered Mental Status   Patient is a 48 y.o. female presenting with altered mental status. The history is provided by the patient and a relative.  Altered Mental Status Presenting symptoms: disorientation and lethargy   Most recent episode:  Today Timing:  Constant Progression:  Unchanged Context: recent illness   Context: not alcohol use, not dementia, not head injury and not a recent change in medication   Associated symptoms: no abdominal pain, no fever, no headaches, no light-headedness, no nausea, no rash, no slurred speech, no visual change, no vomiting and no weakness    Pt with PMHs for COPD and DM, chronic pain presents via EMS for somnolence per family.  Pt agrees she feelks more sleepy.  Believes onset was today.  Feels SOB without pleurisy or hemoptysis Wears 3L O2 [place] at baseline.   Denies chest pain.  Pt notes new cough today with clear sputum production. Denies fevers, chill, n/v/abd pain.  Denies dysuria, hematuria, diarrhea. Has not been compliant with inhalers.  Takes 10 mg oxycodone and 0.5mg  Xanax.  Pill bottles are empty but she says they are in her pill box at home.   Past Medical History  Diagnosis Date  . Migraine headache   . On home O2   . COPD (chronic obstructive pulmonary disease)   . Chronic back pain   . Hyperglycemia, drug-induced     steroid induced hyperglycemia  . ARDS (adult respiratory distress syndrome)     Jan 2011  . Diabetes mellitus   . Pneumonia   . Angina   . Asthma   . Shortness of breath   . Recurrent upper respiratory infection (URI)   . GERD (gastroesophageal reflux disease) 12/21/2012   Past Surgical History  Procedure Laterality Date  . Tubal ligation    . Uterine ablasion    . C-section    . Tracheostomy      decannulated 12/2009   Family History  Problem  Relation Age of Onset  . Coronary artery disease Brother   . Diabetes Other   . Cancer Other   . Hypertension Other    History  Substance Use Topics  . Smoking status: Current Every Day Smoker -- 0.25 packs/day for 33 years    Types: Cigarettes  . Smokeless tobacco: Not on file     Comment: 1/2 pack a day  since age 67  . Alcohol Use: No   OB History   Grav Para Term Preterm Abortions TAB SAB Ect Mult Living                 Review of Systems  Constitutional: Negative for fever and chills.  HENT: Negative for congestion and sore throat.   Respiratory: Positive for cough, shortness of breath and wheezing.   Cardiovascular: Negative for chest pain.  Gastrointestinal: Negative for nausea, vomiting, abdominal pain and blood in stool.  Genitourinary: Negative for dysuria and hematuria.  Musculoskeletal: Negative for back pain.  Skin: Negative for rash.  Neurological: Negative for dizziness, syncope, facial asymmetry, weakness, light-headedness, numbness and headaches.  All other systems reviewed and are negative.     Allergies  Penicillins; Levaquin; and Morphine  Home Medications   Prior to Admission medications   Medication Sig Start Date End Date Taking? Authorizing Provider  acetaminophen (TYLENOL) 500  MG tablet Take 500 mg by mouth every 6 (six) hours as needed for fever.     Historical Provider, MD  albuterol (PROAIR HFA) 108 (90 BASE) MCG/ACT inhaler Inhale 2 puffs into the lungs every 6 (six) hours as needed. For shortness of breATH    Historical Provider, MD  albuterol (PROVENTIL) (2.5 MG/3ML) 0.083% nebulizer solution Take 2.5 mg by nebulization every 4 (four) hours as needed. For shortness of breath  03/19/11   Julio Sicks, NP  azithromycin (ZITHROMAX Z-PAK) 250 MG tablet Take as directed on package 07/25/14   Fredirick Maudlin, MD  budesonide-formoterol [facility]) 160-4.5 MCG/ACT inhaler Inhale 2 puffs into the lungs 2 (two) times daily.    Historical Provider, MD   cefUROXime (CEFTIN) 500 MG tablet Take 1 tablet (500 mg total) by mouth 2 (two) times daily with a meal. 07/25/14   Fredirick Maudlin, MD  docusate sodium (COLACE) 100 MG capsule Take 200 mg by mouth every morning.    Historical Provider, MD  fluticasone (FLONASE) 50 MCG/ACT nasal spray Place 2 sprays into the nose daily.    Historical Provider, MD  lisinopril (PRINIVIL,ZESTRIL) 20 MG tablet Take 20 mg by mouth daily.    Historical Provider, MD  methocarbamol (ROBAXIN) 500 MG tablet Take 500 mg by mouth 2 (two) times daily.    Historical Provider, MD  OxyCODONE (OXYCONTIN) 10 mg T12A Take 1 tablet (10 mg total) by mouth every 12 (twelve) hours. 04/24/13   Fredirick Maudlin, MD  oxyCODONE (ROXICODONE) 15 MG immediate release tablet Take 15 mg by mouth every 4 (four) hours as needed for pain.    Historical Provider, MD  Oxymetazoline HCl (AFRIN NASAL SPRAY NA) Place 1 spray into the nose daily as needed (Congestion).     Historical Provider, MD  pantoprazole (PROTONIX) 40 MG tablet Take 40 mg by mouth daily.    Historical Provider, MD  predniSONE (DELTASONE) 10 MG tablet Take daily for 3 days then 3 day for 3 days then daily for 3 days then one daily for 3 days then stop 07/25/14   Fredirick Maudlin, MD  pregabalin (LYRICA) 50 MG capsule Take 50 mg by mouth every 8 (eight) hours.     Historical Provider, MD  promethazine (PHENERGAN) 25 MG tablet Take 25 mg by mouth every 6 (six) hours as needed. For nausea    Historical Provider, MD  Simethicone (GAS-X EXTRA STRENGTH) 125 MG CAPS Take 2 capsules by mouth every morning.    Historical Provider, MD  SUMAtriptan (IMITREX) 100 MG tablet Take 100 mg by mouth every 2 (two) hours as needed for migraine or headache. May repeat in 2 hours if headache persists or recurs.    Historical Provider, MD  tiotropium (SPIRIVA) 18 MCG inhalation capsule Place 18 mcg into inhaler and inhale daily.    Historical Provider, MD   BP 114/66  Pulse 107  Temp(Src) 99 F (37.2 C)  (Oral)  Resp 18  SpO2 95% Physical Exam  Nursing note and vitals reviewed. Constitutional: She is oriented to person, place, and time. She appears well-developed. No distress.  Somnolent but easily arousable with questioning and answers questions appropriately  HENT:  Head: Normocephalic and atraumatic.  Nose: Nose normal.  Eyes: Conjunctivae are normal. Pupils are equal, round, and reactive to light.  Pupils 4 mm euqal  Neck: Normal range of motion. Neck supple. No tracheal deviation present.  Cardiovascular: Normal rate, regular rhythm and normal heart sounds.   No murmur heard.  Pulmonary/Chest: Effort normal. No respiratory distress. She has wheezes. She has no rales.  Not tachypneic.  Mildly prolonged exp phase with diffuse end exp wheezes and rhonchi.  Moving air. No accessory muscle use  Abdominal: Soft. Bowel sounds are normal. She exhibits no distension and no mass. There is no tenderness.  Musculoskeletal: Normal range of motion. She exhibits no edema.  Neurological: She is oriented to person, place, and time.  Somnolent. oriented x3. CN 3-12 tested and without deficit. 5/5 muscle strength in all extremities with flexion and extension.  Normal bulk and tone.  No sensory deficit to light touch.  Tandem gait normal.  No pronator drift.    Skin: Skin is warm and dry. No rash noted. She is not diaphoretic.  Psychiatric: She has a normal mood and affect.    ED Course  Procedures (including critical care time) Labs Review Labs Reviewed  CBC WITH DIFFERENTIAL - Abnormal; Notable for the following:    WBC 11.2 (*)    Monocytes Absolute 1.1 (*)    All other components within normal limits  CBG MONITORING, ED - Abnormal; Notable for the following:    Glucose-Capillary 174 (*)    All other components within normal limits  I-STAT ARTERIAL BLOOD GAS, ED - Abnormal; Notable for the following:    pH, Arterial 7.278 (*)    pCO2 arterial 70.5 (*)    pO2, Arterial 71.0 (*)     Bicarbonate 33.0 (*)    Acid-Base Excess 3.0 (*)    All other components within normal limits  BLOOD GAS, ARTERIAL  COMPREHENSIVE METABOLIC PANEL  I-STAT TROPOININ, ED    Imaging Review Dg Chest 2 View  08/14/2014   CLINICAL DATA:  Altered mental status, difficulty breathing  EXAM: CHEST  2 VIEW  COMPARISON:  07/24/2014 and 03/11/2014  FINDINGS: Cardiomediastinal silhouette is stable. No acute infiltrate or pulmonary edema. Mild perihilar and infrahilar bronchitic changes. Bony thorax is unremarkable.  IMPRESSION: No acute infiltrate or pulmonary edema. Mild perihilar infrahilar bronchitic changes.   Electronically Signed   By: Natasha Mead M.D.   On: 08/14/2014 16:57     EKG Interpretation   Date/Time:  Tuesday August 14 2014 15:21:22 EDT Ventricular Rate:  106 PR Interval:  169 QRS Duration: 87 QT Interval:  301 QTC Calculation: 400 R Axis:   55 Text Interpretation:  Sinus tachycardia No significant change since last  tracing Confirmed by STEINL  MD, Caryn Bee ([id number]) on 08/14/2014 4:29:25 PM      MDM   Final diagnoses:  COPD exacerbation  Hyperkalemia  Respiratory acidosis  AKI (acute kidney injury)  Somnolence    Pt with COPD and chronic pain.  Presents for somnolence.  Somnolent but easily arousable to vocal stimulations, protecting airways, pupils 4 mm equal, defer Narcan. Hx of COPD and with prolonged exp pahse and wheezes in setting of new cough, suspect CO2 narcosis from COPD exacerbation complicated by underlying illness.  At baseline O2 requirement.  Considered accidental overdose, pt denies SI/HI or taking more medication than prescribed.  No concern for CVA, neuro exam normal.  CXR w/o PNA. EKG non ischemic but with somewhat peaked T waves.  No signs of DVT, primary diagnosis of COPD exacerbation much more likely than PE.  Medications  azithromycin (ZITHROMAX) 500 mg in dextrose 5 % 250 mL IVPB (500 mg Intravenous New Bag/Given 08/14/14 1709)  insulin aspart  (novoLOG) injection 10 Units (not administered)  dextrose 50 % solution 50 mL (not administered)  calcium gluconate  inj 10% (1 g) URGENT USE ONLY! (not administered)  ipratropium-albuterol (DUONEB) 0.5-2.5 (3) MG/3ML nebulizer solution 3 mL (3 mLs Nebulization Given 08/14/14 1603)  methylPREDNISolone sodium succinate (SOLU-MEDROL) 125 mg/2 mL injection 125 mg (125 mg Intravenous Given 08/14/14 1710)  albuterol (PROVENTIL) (2.5 MG/3ML) 0.083% nebulizer solution 5 mg (5 mg Nebulization Given 08/14/14 1659)  ipratropium (ATROVENT) nebulizer solution 0.5 mg (0.5 mg Nebulization Given 08/14/14 1659)   ABG: respiratory acidosis. Start BiPAP.  Maintain O2 sats at 90-92%.  Pt cooperated with medical treatment plan, is able to tolerate.   5:34 PM labs with hyperk with slight hemolysis and AKI.  Tend to believe K level given EKG.  Already on albuterol. Repeat istat chem8.   Ordered exchange agents. Consulted to critical care who will admit   Sofie Rower, MD 08/14/14 [phone]

## 2014-08-14 NOTE — ED Notes (Signed)
MD at bedside. Admitting  

## 2014-08-14 NOTE — ED Notes (Signed)
Pt refusing to allow this RN to place Mask on of Neb. Reason for mask is pt continues to drop the hand held while falling asleep.

## 2014-08-14 NOTE — ED Notes (Signed)
48 yo female from home with Altered Mental Status noted by family. Per family pt would just stare and would not talk. Per [facility], PT was not talking and would just stare. Pt disoriented. 18 g placed in R AC. CBG 141. HX of 3L home o2 dependent, emphysema and chronic back pain. Pill bottles at bedside. Vitals stable.

## 2014-08-14 NOTE — Progress Notes (Signed)
After ABG placed pt on nasal cannula of 4LPM which is what pt states she wears at home. Pt is holding sats at 93% informed pt we may place her back on BiPAP at a later time if exhibits abnormal LOC after meds. Pt is resting comfortably, RT will continue to monitor.

## 2014-08-14 NOTE — Progress Notes (Signed)
eLink Physician-Brief Progress Note Patient Name: [name] DOB: [birth year] MRN: [id number]   Date of Service  08/14/2014  HPI/Events of Note  Severely agitated and restless. Hx RLS on lyrica at home  eICU Interventions  Additional dose lyrica now, also ativan  x 1      Intervention Category Minor Interventions: Agitation / anxiety - evaluation and management  Keah Lamba S. 08/14/2014, 11:22 PM

## 2014-08-14 NOTE — Progress Notes (Signed)
eLink Physician-Brief Progress Note Patient Name: [name] DOB: September 02, 1966 MRN: [id number]   Date of Service  08/14/2014  HPI/Events of Note    eICU Interventions  Home dose of PRN Xanax ordered     Intervention Category Minor Interventions: Agitation / anxiety - evaluation and management  Billy Fischer 08/14/2014, 9:00 PM

## 2014-08-15 LAB — BASIC METABOLIC PANEL
Anion gap: 11 (ref 5–15)
BUN: 16 mg/dL (ref 6–23)
CO2: 28 mEq/L (ref 19–32)
Calcium: 9.6 mg/dL (ref 8.4–10.5)
Chloride: 94 mEq/L — ABNORMAL LOW (ref 96–112)
Creatinine, Ser: 0.52 mg/dL (ref 0.50–1.10)
GFR calc Af Amer: >90 mL/min (ref >90)
GLUCOSE: 179 mg/dL — AB (ref 70–99)
Potassium: 5.3 mEq/L (ref 3.7–5.3)
SODIUM: 133 meq/L — AB (ref 137–147)

## 2014-08-15 LAB — MAGNESIUM: Magnesium: 1.5 mg/dL (ref 1.5–2.5)

## 2014-08-15 LAB — GLUCOSE, CAPILLARY
GLUCOSE-CAPILLARY: 170 mg/dL — AB (ref 70–99)
GLUCOSE-CAPILLARY: 191 mg/dL — AB (ref 70–99)
Glucose-Capillary: 331 mg/dL — ABNORMAL HIGH (ref 70–99)

## 2014-08-15 LAB — CBC
HEMATOCRIT: 38.2 % (ref 36.0–46.0)
Hemoglobin: 12.3 g/dL (ref 12.0–15.0)
MCH: 27.8 pg (ref 26.0–34.0)
MCHC: 32.2 g/dL (ref 30.0–36.0)
MCV: 86.4 fL (ref 78.0–100.0)
Platelets: 278 10*3/uL (ref 150–400)
RBC: 4.42 MIL/uL (ref 3.87–5.11)
RDW: 13.3 % (ref 11.5–15.5)
WBC: 6.7 10*3/uL (ref 4.0–10.5)

## 2014-08-15 LAB — PHOSPHORUS: Phosphorus: 2.2 mg/dL — ABNORMAL LOW (ref 2.3–4.6)

## 2014-08-15 MED ORDER — BOOST / RESOURCE BREEZE PO LIQD: 1.0000 | Freq: Three times a day (TID) | ORAL | Status: DC

## 2014-08-15 MED ORDER — METHOCARBAMOL 500 MG PO TABS
500.0000 mg | ORAL_TABLET | Freq: Two times a day (BID) | ORAL | Status: DC
  Filled 2014-08-15 (×2): qty 1

## 2014-08-15 MED ORDER — GLUCERNA SHAKE PO LIQD: 237.0000 mL | Freq: Three times a day (TID) | ORAL | Status: DC

## 2014-08-15 MED ORDER — LORAZEPAM 2 MG/ML IJ SOLN
2.0000 mg | Freq: Once | INTRAMUSCULAR | Status: AC
  Administered 2014-08-15: 2 mg via INTRAVENOUS
  Filled 2014-08-15: qty 1

## 2014-08-15 MED ORDER — MAGNESIUM OXIDE 400 (241.3 MG) MG PO TABS
800.0000 mg | ORAL_TABLET | Freq: Every day | ORAL | Status: DC
  Administered 2014-08-15: 800 mg via ORAL
  Filled 2014-08-15: qty 2

## 2014-08-15 MED ORDER — OXYCODONE HCL 5 MG PO TABS
10.0000 mg | ORAL_TABLET | ORAL | Status: AC
  Administered 2014-08-15: 10 mg via ORAL
  Filled 2014-08-15: qty 2

## 2014-08-15 MED ORDER — INSULIN ASPART 100 UNIT/ML ~~LOC~~ SOLN: 0.0000 [IU] | Freq: Three times a day (TID) | SUBCUTANEOUS | Status: DC

## 2014-08-15 MED ORDER — AZITHROMYCIN 500 MG PO TABS
500.0000 mg | ORAL_TABLET | Freq: Every day | ORAL | Status: DC
  Filled 2014-08-15: qty 1

## 2014-08-15 MED ORDER — PANTOPRAZOLE SODIUM 40 MG PO TBEC: 40.0000 mg | DELAYED_RELEASE_TABLET | Freq: Every day | ORAL | Status: DC

## 2014-08-15 MED ORDER — METHYLPREDNISOLONE SODIUM SUCC 125 MG IJ SOLR
60.0000 mg | Freq: Two times a day (BID) | INTRAMUSCULAR | Status: DC
  Filled 2014-08-15 (×2): qty 0.96

## 2014-08-15 MED ORDER — OXYCODONE HCL ER 10 MG PO T12A
10.0000 mg | EXTENDED_RELEASE_TABLET | Freq: Two times a day (BID) | ORAL | Status: DC
  Administered 2014-08-15: 10 mg via ORAL
  Filled 2014-08-15: qty 1

## 2014-08-15 NOTE — Clinical Social Work Note (Signed)
Clinical Social Worker received referral for COPD Gold Protocol today.  Patient was discharged same day prior to social work intervention.  Clinical Social Worker will sign off for now as social work intervention is no longer needed. Please consult [place] again if new need arises.  Macario Golds, LCSW [phone]  (Covering for Marcelline Deist, [place])

## 2014-08-15 NOTE — Care Management Note (Signed)
    Page 1 of 1   08/15/2014     8:42:50 AM CARE MANAGEMENT NOTE 08/15/2014  Patient:  [name], [name]   Account Number:  [id number]  Date Initiated:  08/15/2014  Documentation initiated by:  Junius Creamer  Subjective/Objective Assessment:   adm w copd exacerb     Action/Plan:   lives w fam, uses home o2, pcp dr ed Juanetta Gosling   Anticipated DC Date:     Anticipated DC Plan:           Choice offered to / List presented to:             Status of service:   Medicare Important Message given?   (If response is "NO", the following Medicare IM given date fields will be blank) Date Medicare IM given:   Medicare IM given by:   Date Additional Medicare IM given:   Additional Medicare IM given by:    Discharge Disposition:    Per UR Regulation:  Reviewed for med. necessity/level of care/duration of stay  If discussed at Long Length of Stay Meetings, dates discussed:    Comments:

## 2014-08-15 NOTE — Evaluation (Signed)
Physical Therapy Evaluation Patient Details Name: [name] MRN: [id number] DOB: 1966/05/21 Today's Date: 08/15/2014   History of Present Illness    48 y.o. F brought to [facility] ED on 9/22 after family found her to be altered and minimally responsive. In ED, she was found to have hypercarbic respiratory failure with an acute exacerbation of COPD. She was started on BiPAP and PCCM was consulted.  PMH - old tstomy, COPD on home O2, chronic pain on MS contin & BDZ   Clinical Impression  Pt admitted with above. Pt currently with functional limitations due to the deficits listed below (see PT Problem List). Should progress well.  Endurance is greatest issue. Pt will benefit from skilled PT to increase their independence and safety with mobility to allow discharge to the venue listed below.      Follow Up Recommendations Home health PT;Supervision/Assistance - 24 hour    Equipment Recommendations  Other (comment) (TBA)    Recommendations for Other Services       Precautions / Restrictions Precautions Precautions: Fall Restrictions Weight Bearing Restrictions: No      Mobility  Bed Mobility Overal bed mobility: Independent                Transfers Overall transfer level: Needs assistance   Transfers: Sit to/from Stand;Stand Pivot Transfers Sit to Stand: Supervision Stand pivot transfers: Supervision       General transfer comment: Pt took a few pivotal steps to chair.  No real assist needed and pt appears steady.  Pt does "rock" back and forth.  Was doing it in bed and in chair.  Family states its due to her back pain.  Appears very nervous and can't be still.  Pt sitting indian style in chair once PT got her to chair and then quickly put her feet on floor and then a few min later, pt crossed her legs [place] style again.  Asked for pain meds and PT notified nursing but nursing states pt had pain meds.    Ambulation/Gait                Stairs             Wheelchair Mobility    Modified Rankin (Stroke Patients Only)       Balance Overall balance assessment: Needs assistance Sitting-balance support: No upper extremity supported;Feet supported Sitting balance-Leahy Scale: Good     Standing balance support: Single extremity supported;During functional activity Standing balance-Leahy Scale: Poor Standing balance comment: Needed steadying assist initially upon standing.                              Pertinent Vitals/Pain Pain Assessment: 0-10 Pain Score: 10-Worst pain ever Pain Location: back Pain Descriptors / Indicators: Throbbing;Constant Pain Intervention(s): Limited activity within patient's tolerance;Monitored during session;Repositioned;Patient requesting pain meds-RN notifiedVSS on 4LO2.  HR 121 bpm.     Home Living Family/patient expects to be discharged to:: Private residence Living Arrangements: Children Available Help at Discharge: Family;Available 24 hours/day Type of Home: House Home Access: Stairs to enter Entrance Stairs-Rails: Can reach both;Left;Right Entrance Stairs-Number of Steps: 3 Home Layout: One level Home Equipment: Other (comment) (3LO2 at home)      Prior Function Level of Independence: Independent               Hand Dominance   Dominant Hand: Right    Extremity/Trunk Assessment   Upper Extremity Assessment: Defer to OT  evaluation           Lower Extremity Assessment: Generalized weakness      Cervical / Trunk Assessment: Normal  Communication   Communication: No difficulties  Cognition Arousal/Alertness: Awake/alert Behavior During Therapy: Anxious Overall Cognitive Status: Within Functional Limits for tasks assessed                      General Comments      Exercises General Exercises - Lower Extremity Ankle Circles/Pumps: AROM;Both;10 reps;Seated Long Arc Quad: AROM;Both;10 reps;Seated      Assessment/Plan    PT Assessment Patient  needs continued PT services  PT Diagnosis Generalized weakness;Acute pain   PT Problem List Decreased balance;Decreased activity tolerance;Decreased mobility;Decreased knowledge of use of DME;Decreased safety awareness;Decreased knowledge of precautions;Pain  PT Treatment Interventions DME instruction;Gait training;Functional mobility training;Therapeutic activities;Therapeutic exercise;Balance training;Patient/family education   PT Goals (Current goals can be found in the Care Plan section) Acute Rehab PT Goals Patient Stated Goal: to go home PT Goal Formulation: With patient Time For Goal Achievement: 08/22/14 Potential to Achieve Goals: Good    Frequency Min 3X/week   Barriers to discharge        Co-evaluation               End of Session Equipment Utilized During Treatment: Gait belt;Oxygen Activity Tolerance: Patient limited by fatigue;Patient limited by pain Patient left: in chair;with call bell/phone within reach;with family/visitor present Nurse Communication: Mobility status         Time: 1610-9604 PT Time Calculation (min): 17 min   Charges:   PT Evaluation $Initial PT Evaluation Tier I: 1 Procedure PT Treatments $Therapeutic Activity: 8-22 mins   PT G Codes:          INGOLD,Anneliese Leblond 09-06-2014, 10:43 AM [facility] Acute Rehabilitation [phone] (pager)

## 2014-08-15 NOTE — Progress Notes (Signed)
[facility] PCCM  I came by to see her today. She has improved but is still wheezing and tachycardic.  We have restarted all her home medications including narcotics.  She is demanding to leave.  I explained to her and her son at length that I recommend that she stay hospitalized for another 3-4 days for her COPD exacerbation.  They demanded discharge.  I explained they would need to leave AMA.  They voiced understanding.  Heber [place], MD [place] PCCM Pager: [phone] Cell: [phone] If no response, call [phone]

## 2014-08-15 NOTE — Progress Notes (Signed)
Patient stated numerous times that she wants to go home. Informed patient because of her  Respiratory status that she might have to stay another day. She demand to see the doctor, because she wants to go home now. Doctor spoke with patient about her condition and the risk involved in leaving in the middle of her treatment. She states that she knows her condition, she has been having respiratory issues for years. Patients states again I want to go home. Informed patient that her insurance will not pay for her stay if she leaves against medical advice. AMA paperwork signed at 1205.  Cory Roughen, RN

## 2014-08-15 NOTE — Progress Notes (Addendum)
INITIAL NUTRITION ASSESSMENT  DOCUMENTATION CODES Per approved criteria  -Not Applicable   INTERVENTION: Glucerna Shake po TID, each supplement provides 220 kcal and 10 grams of protein  NUTRITION DIAGNOSIS: Inadequate oral intake related to recent altered mental status as evidenced by 7% weight loss in the past month.   Goal: Intake to meet >90% of estimated nutrition needs.  Monitor:  PO intake, labs, weight trend.  Reason for Assessment: MD Consult  48 y.o. female  Admitting Dx: SOB  ASSESSMENT: 48 y.o. F brought to [facility] ED on 9/22 after family found her to be altered and minimally responsive. In ED, she was found to have hypercarbic respiratory failure with an acute exacerbation of COPD. She was started on BiPAP.  Nutrition focused physical exam completed.  No muscle or subcutaneous fat depletion noticed. Patient reports that she does not think she has lost weight. When told her current weight was 141 lbs, she was happy that she has lost weight. Usual weight in the 170's-180's. Confused about what she ate for breakfast this morning. Diet just advanced to CHO-modified.  Patient is at nutrition risk, given recent 7% weight loss in the past month with suspected poor oral intake.  Height: Ht Readings from Last 1 Encounters:  08/14/14 5' 1.5" (1.562 m)    Weight: Wt Readings from Last 1 Encounters:  08/15/14 141 lb 5 oz (64.1 kg)    Ideal Body Weight: 48.9 kg  % Ideal Body Weight: 131%  Wt Readings from Last 10 Encounters:  08/15/14 141 lb 5 oz (64.1 kg)  07/24/14 152 lb (68.947 kg)  03/11/14 152 lb (68.947 kg)  06/22/13 157 lb (71.215 kg)  04/23/13 172 lb 2.9 oz (78.1 kg)  12/21/12 168 lb 6.4 oz (76.386 kg)  11/18/12 179 lb (81.194 kg)  07/17/12 170 lb 6 oz (77.282 kg)  02/03/12 174 lb 9.7 oz (79.2 kg)  11/26/11 178 lb (80.74 kg)    Usual Body Weight: 152 lb 1 month ago  % Usual Body Weight: 93%  BMI:  Body mass index is 26.27 kg/(m^2).  Estimated  Nutritional Needs: Kcal: 1600-1800 Protein: 85-95 gm Fluid: 1.6-1.8 L  Skin: WDL  Diet Order: Carb Control  EDUCATION NEEDS: -Education not appropriate at this time   Intake/Output Summary (Last 24 hours) at 08/15/14 1146 Last data filed at 08/15/14 0600  Gross per 24 hour  Intake 917.33 ml  Output   2375 ml  Net -1457.67 ml    Last BM: 9/22   Labs:   Recent Labs Lab 08/14/14 1635 08/14/14 1757 08/14/14 2136 08/15/14 0225  NA 130* 128* 133* 133*  K 6.5* 6.2* 5.7* 5.3  CL 91* 95* 94* 94*  CO2 26  --  30 28  BUN 30* 39* 19 16  CREATININE 1.14* 1.30* 0.70 0.52  CALCIUM 9.4  --  9.6 9.6  MG  --   --   --  1.5  PHOS  --   --   --  2.2*  GLUCOSE 117* 152* 223* 179*    CBG (last 3)   Recent Labs  08/15/14 0008 08/15/14 0403 08/15/14 0819  GLUCAP 331* 170* 191*    Scheduled Meds: . antiseptic oral rinse  7 mL Mouth Rinse q12n4p  . azithromycin  500 mg Oral QHS  . chlorhexidine  15 mL Mouth Rinse BID  . feeding supplement (RESOURCE BREEZE)  1 Container Oral TID BM  . heparin  5,000 Units Subcutaneous 3 times per day  . insulin aspart  0-15 Units Subcutaneous TID AC & HS  . ipratropium-albuterol  3 mL Nebulization Q6H  . magnesium oxide  800 mg Oral Daily  . methylPREDNISolone (SOLU-MEDROL) injection  60 mg Intravenous Q12H  . OxyCODONE  10 mg Oral Q12H  . pantoprazole  40 mg Oral QHS  . pregabalin  50 mg Oral 3 times per day    Continuous Infusions: . sodium chloride Stopped (08/15/14 0600)    Past Medical History  Diagnosis Date  . Migraine headache   . On home O2   . COPD (chronic obstructive pulmonary disease)   . Chronic back pain   . Hyperglycemia, drug-induced     steroid induced hyperglycemia  . ARDS (adult respiratory distress syndrome)     Jan 2011  . Diabetes mellitus   . Pneumonia   . Angina   . Asthma   . Shortness of breath   . Recurrent upper respiratory infection (URI)   . GERD (gastroesophageal reflux disease) 12/21/2012     Past Surgical History  Procedure Laterality Date  . Tubal ligation    . Uterine ablasion    . C-section    . Tracheostomy      decannulated 12/2009    Joaquin Courts, RD, LDN, CNSC Pager [phone] After Hours Pager [phone]

## 2014-08-18 NOTE — Discharge Summary (Signed)
[facility] PCCM Discharge Summary  ADMISSION DATE: 08/14/2014  CONSULTATION DATE: 08/14/2014  REFERRING MD : EDP  CHIEF COMPLAINT: SOB   INITIAL PRESENTATION: 48 y.o. F brought to [facility] ED on 9/22 after family found her to be altered and minimally responsive. In ED, she was found to have hypercarbic respiratory failure with an acute exacerbation of COPD. She was started on BiPAP and PCCM was consulted.   Past Medical History  Diagnosis Date  . Migraine headache   . On home O2   . COPD (chronic obstructive pulmonary disease)   . Chronic back pain   . Hyperglycemia, drug-induced     steroid induced hyperglycemia  . ARDS (adult respiratory distress syndrome)     Jan 2011  . Diabetes mellitus   . Pneumonia   . Angina   . Asthma   . Shortness of breath   . Recurrent upper respiratory infection (URI)   . GERD (gastroesophageal reflux disease) 12/21/2012   Physical exam on admission:  VITAL SIGNS:  Temp: [99 F (37.2 C)] 99 F (37.2 C) (09/22 1522)  Pulse Rate: [105-113] 113 (09/22 1730)  Resp: [17-20] 19 (09/22 1730)  BP: (101-127)/(59-107) 127/107 mmHg (09/22 1730)  SpO2: [93 %-99 %] 95 % (09/22 1730)  FiO2 (%): [30 %] 30 % (09/22 1706)   HEMODYNAMICS:  VENTILATOR SETTINGS:  Vent Mode: [-]  FiO2 (%): [30 %] 30 %   INTAKE / OUTPUT:  Intake/Output  None   PHYSICAL EXAMINATION:  General: Ill appearing female, in NAD.  Neuro: A&O x 3, non-focal.  HEENT: [place]/AT. PERRL, sclerae anicteric. BiPAP in place.  Cardiovascular: RRR, no M/R/G.  Lungs: Respirations shallow. Diffuse expiratory wheeze. On BiPAP.  Abdomen: BS x 4, soft, NT/ND.  Musculoskeletal: No gross deformities, no edema.  Skin: Intact, warm, no rashes.  Hospital Course: Ms. Pemberton was admitted to the [facility] ICU on 9/22 for a COPD exacerbation requiring BIPAP.  She was treated with solumedrol, bronchodilators and azithromycin.  Not long after arrival to the ICU her breathing improved to the point that she was able to stop using  the BIPAP machine.  She slept comfortably until the morning of 9/23.  At that point she had some pain and requested her home pain regimen which was restarted.  However despite this she demanded discharge.  On exam she still had wheezing and tachycardia and it was recommended by the [facility] team that she remain in hospital for further treatment of her COPD. She refused and left against medical advice.  Home medications: Same as prior to admission  Follow up: she should follow up with her primary care physician.  Heber Henderson, MD [place] PCCM Pager: [phone] Cell: [phone] If no response, call [phone]

## 2014-08-18 NOTE — ED Provider Notes (Signed)
Pt c/o sob, w hx copd. Also drowsy, w altered mental status/confusion.    Iv ns. o2 continuous pule ox and monitor. Labs. Pcxr. Abg.  resp therapy consulted.   Albuterol and atrovent neb - on air.   Bipap.   abd w resp acidosis. Bipap.  Additional nebs.  Multiple reasessments of pt - more alert. Improved air exchange w bipap and nebs.  Results for orders placed during the hospital encounter of 08/14/14  MRSA PCR SCREENING      Result Value Ref Range   MRSA by PCR NEGATIVE  NEGATIVE  CBC WITH DIFFERENTIAL      Result Value Ref Range   WBC 11.2 (*) 4.0 - 10.5 K/uL   RBC 4.88  3.87 - 5.11 MIL/uL   Hemoglobin 14.0  12.0 - 15.0 g/dL   HCT 16.1  09.6 - 04.5 %   MCV 90.2  78.0 - 100.0 fL   MCH 28.7  26.0 - 34.0 pg   MCHC 31.8  30.0 - 36.0 g/dL   RDW 40.9  81.1 - 91.4 %   Platelets 273  150 - 400 K/uL   Neutrophils Relative % 64  43 - 77 %   Neutro Abs 7.3  1.7 - 7.7 K/uL   Lymphocytes Relative 25  12 - 46 %   Lymphs Abs 2.8  0.7 - 4.0 K/uL   Monocytes Relative 10  3 - 12 %   Monocytes Absolute 1.1 (*) 0.1 - 1.0 K/uL   Eosinophils Relative 1  0 - 5 %   Eosinophils Absolute 0.1  0.0 - 0.7 K/uL   Basophils Relative 0  0 - 1 %   Basophils Absolute 0.0  0.0 - 0.1 K/uL  COMPREHENSIVE METABOLIC PANEL      Result Value Ref Range   Sodium 130 (*) 137 - 147 mEq/L   Potassium 6.5 (*) 3.7 - 5.3 mEq/L   Chloride 91 (*) 96 - 112 mEq/L   CO2 26  19 - 32 mEq/L   Glucose, Bld 117 (*) 70 - 99 mg/dL   BUN 30 (*) 6 - 23 mg/dL   Creatinine, Ser 7.82 (*) 0.50 - 1.10 mg/dL   Calcium 9.4  8.4 - 95.6 mg/dL   Total Protein 7.2  6.0 - 8.3 g/dL   Albumin 3.9  3.5 - 5.2 g/dL   AST 14  0 - 37 U/L   ALT 9  0 - 35 U/L   Alkaline Phosphatase 70  39 - 117 U/L   Total Bilirubin <0.2 (*) 0.3 - 1.2 mg/dL   GFR calc non Af Amer 57 (*) >90 mL/min   GFR calc Af Amer 66 (*) >90 mL/min   Anion gap 13  5 - 15  CBC      Result Value Ref Range   WBC 6.7  4.0 - 10.5 K/uL   RBC 4.42  3.87 - 5.11 MIL/uL   Hemoglobin 12.3  12.0 - 15.0 g/dL   HCT 21.3  08.6 - 57.8 %   MCV 86.4  78.0 - 100.0 fL   MCH 27.8  26.0 - 34.0 pg   MCHC 32.2  30.0 - 36.0 g/dL   RDW 46.9  62.9 - 52.8 %   Platelets 278  150 - 400 K/uL  BASIC METABOLIC PANEL      Result Value Ref Range   Sodium 133 (*) 137 - 147 mEq/L   Potassium 5.3  3.7 - 5.3 mEq/L   Chloride 94 (*) 96 - 112 mEq/L  CO2 28  19 - 32 mEq/L   Glucose, Bld 179 (*) 70 - 99 mg/dL   BUN 16  6 - 23 mg/dL   Creatinine, Ser 1.61  0.50 - 1.10 mg/dL   Calcium 9.6  8.4 - 09.6 mg/dL   GFR calc non Af Amer >90  >90 mL/min   GFR calc Af Amer >90  >90 mL/min   Anion gap 11  5 - 15  PHOSPHORUS      Result Value Ref Range   Phosphorus 2.2 (*) 2.3 - 4.6 mg/dL  MAGNESIUM      Result Value Ref Range   Magnesium 1.5  1.5 - 2.5 mg/dL  BASIC METABOLIC PANEL      Result Value Ref Range   Sodium 133 (*) 137 - 147 mEq/L   Potassium 5.7 (*) 3.7 - 5.3 mEq/L   Chloride 94 (*) 96 - 112 mEq/L   CO2 30  19 - 32 mEq/L   Glucose, Bld 223 (*) 70 - 99 mg/dL   BUN 19  6 - 23 mg/dL   Creatinine, Ser 0.45  0.50 - 1.10 mg/dL   Calcium 9.6  8.4 - 40.9 mg/dL   GFR calc non Af Amer >90  >90 mL/min   GFR calc Af Amer >90  >90 mL/min   Anion gap 9  5 - 15  GLUCOSE, CAPILLARY      Result Value Ref Range   Glucose-Capillary 200 (*) 70 - 99 mg/dL  GLUCOSE, CAPILLARY      Result Value Ref Range   Glucose-Capillary 331 (*) 70 - 99 mg/dL   Comment 1 Documented in Chart     Comment 2 Notify RN    GLUCOSE, CAPILLARY      Result Value Ref Range   Glucose-Capillary 170 (*) 70 - 99 mg/dL   Comment 1 Documented in Chart     Comment 2 Notify RN    GLUCOSE, CAPILLARY      Result Value Ref Range   Glucose-Capillary 191 (*) 70 - 99 mg/dL  CBG MONITORING, ED      Result Value Ref Range   Glucose-Capillary 174 (*) 70 - 99 mg/dL  I-STAT TROPOININ, ED      Result Value Ref Range   Troponin i, poc 0.00  0.00 - 0.08 ng/mL   Comment 3           I-STAT ARTERIAL BLOOD GAS, ED      Result  Value Ref Range   pH, Arterial 7.278 (*) 7.350 - 7.450   pCO2 arterial 70.5 (*) 35.0 - 45.0 mmHg   pO2, Arterial 71.0 (*) 80.0 - 100.0 mmHg   Bicarbonate 33.0 (*) 20.0 - 24.0 mEq/L   TCO2 35  0 - 100 mmol/L   O2 Saturation 91.0     Acid-Base Excess 3.0 (*) 0.0 - 2.0 mmol/L   Patient temperature 98.6 F     Collection site RADIAL, ALLEN'S TEST ACCEPTABLE     Drawn by Operator     Sample type ARTERIAL     Comment NOTIFIED PHYSICIAN    I-STAT CHEM 8, ED      Result Value Ref Range   Sodium 128 (*) 137 - 147 mEq/L   Potassium 6.2 (*) 3.7 - 5.3 mEq/L   Chloride 95 (*) 96 - 112 mEq/L   BUN 39 (*) 6 - 23 mg/dL   Creatinine, Ser 8.11 (*) 0.50 - 1.10 mg/dL   Glucose, Bld 914 (*) 70 - 99 mg/dL  Calcium, Ion 1.20  1.12 - 1.23 mmol/L   TCO2 34  0 - 100 mmol/L   Hemoglobin 17.3 (*) 12.0 - 15.0 g/dL   HCT 16.1 (*) 09.6 - 04.5 %  POCT I-STAT 3, ART BLOOD GAS (G3+)      Result Value Ref Range   pH, Arterial 7.362  7.350 - 7.450   pCO2 arterial 54.7 (*) 35.0 - 45.0 mmHg   pO2, Arterial 58.0 (*) 80.0 - 100.0 mmHg   Bicarbonate 31.0 (*) 20.0 - 24.0 mEq/L   TCO2 33  0 - 100 mmol/L   O2 Saturation 88.0     Acid-Base Excess 4.0 (*) 0.0 - 2.0 mmol/L   Patient temperature 98.7 F     Collection site RADIAL, ALLEN'S TEST ACCEPTABLE     Drawn by RT     Sample type ARTERIAL     Dg Chest 2 View  08/14/2014   CLINICAL DATA:  Altered mental status, difficulty breathing  EXAM: CHEST  2 VIEW  COMPARISON:  07/24/2014 and 03/11/2014  FINDINGS: Cardiomediastinal silhouette is stable. No acute infiltrate or pulmonary edema. Mild perihilar and infrahilar bronchitic changes. Bony thorax is unremarkable.  IMPRESSION: No acute infiltrate or pulmonary edema. Mild perihilar infrahilar bronchitic changes.   Electronically Signed   By: Natasha Mead M.D.   On: 08/14/2014 16:57     Dg Chest Port 1 View  07/24/2014   CLINICAL DATA:  Shortness of Breath  EXAM: PORTABLE CHEST - 1 VIEW  COMPARISON:  03/11/2014  FINDINGS:  Cardiomediastinal silhouette is stable. Stable streaky right basilar atelectasis or scarring. No segmental infiltrate or pulmonary edema.  IMPRESSION: No segmental infiltrate or pulmonary edema. Stable streaky right basilar atelectasis or scarring.   Electronically Signed   By: Natasha Mead M.D.   On: 07/24/2014 16:38    Pt also w aki on labs, and inc k,  Will rx and redraw to verify high k.  Ecg. Monitor.   Repeat abg  Critical care consulted.  CRITICAL CARERE copd exacerbation, altered mental status, acute on chronic resp failure, hyperkalemia.  Performed by: Suzi Roots Total critical care time: 35 Critical care time was exclusive of separately billable procedures and treating other patients. Critical care was necessary to treat or prevent imminent or life-threatening deterioration. Critical care was time spent personally by me on the following activities: development of treatment plan with patient and/or surrogate as well as nursing, discussions with consultants, evaluation of patient's response to treatment, examination of patient, obtaining history from patient or surrogate, ordering and performing treatments and interventions, ordering and review of laboratory studies, ordering and review of radiographic studies, pulse oximetry and re-evaluation of patient's condition.   Suzi Roots, MD 08/18/14 2101

## 2014-11-01 ENCOUNTER — Ambulatory Visit (INDEPENDENT_AMBULATORY_CARE_PROVIDER_SITE_OTHER): Payer: Medicaid Other | Admitting: Otolaryngology

## 2015-01-29 ENCOUNTER — Ambulatory Visit (HOSPITAL_COMMUNITY)
Admission: RE | Admit: 2015-01-29 | Discharge: 2015-01-29 | Disposition: A | Discharge status: Home / Self Care | Payer: Medicaid Other | Source: Ambulatory Visit | Attending: Pulmonary Disease | Admitting: Pulmonary Disease

## 2015-01-29 ENCOUNTER — Other Ambulatory Visit (HOSPITAL_COMMUNITY): Payer: Self-pay | Admitting: Pulmonary Disease

## 2015-01-29 DIAGNOSIS — R0789 Other chest pain: Secondary | ICD-10-CM

## 2015-01-29 DIAGNOSIS — R0602 Shortness of breath: Secondary | ICD-10-CM | POA: Diagnosis present

## 2015-02-07 ENCOUNTER — Ambulatory Visit (INDEPENDENT_AMBULATORY_CARE_PROVIDER_SITE_OTHER): Payer: Medicaid Other | Admitting: Otolaryngology

## 2015-02-21 ENCOUNTER — Ambulatory Visit (INDEPENDENT_AMBULATORY_CARE_PROVIDER_SITE_OTHER): Payer: Medicaid Other | Admitting: Otolaryngology

## 2015-04-16 IMAGING — CR DG CHEST 2V
2 series · 2 of 2 positions shown · non-contrast
Comparison: 04/21/2013

CLINICAL DATA: Shortness of breath since 10 p.m.. Productive cough
for 3 days.

EXAM:
CHEST  2 VIEW

[view not recorded (1 of 2)]
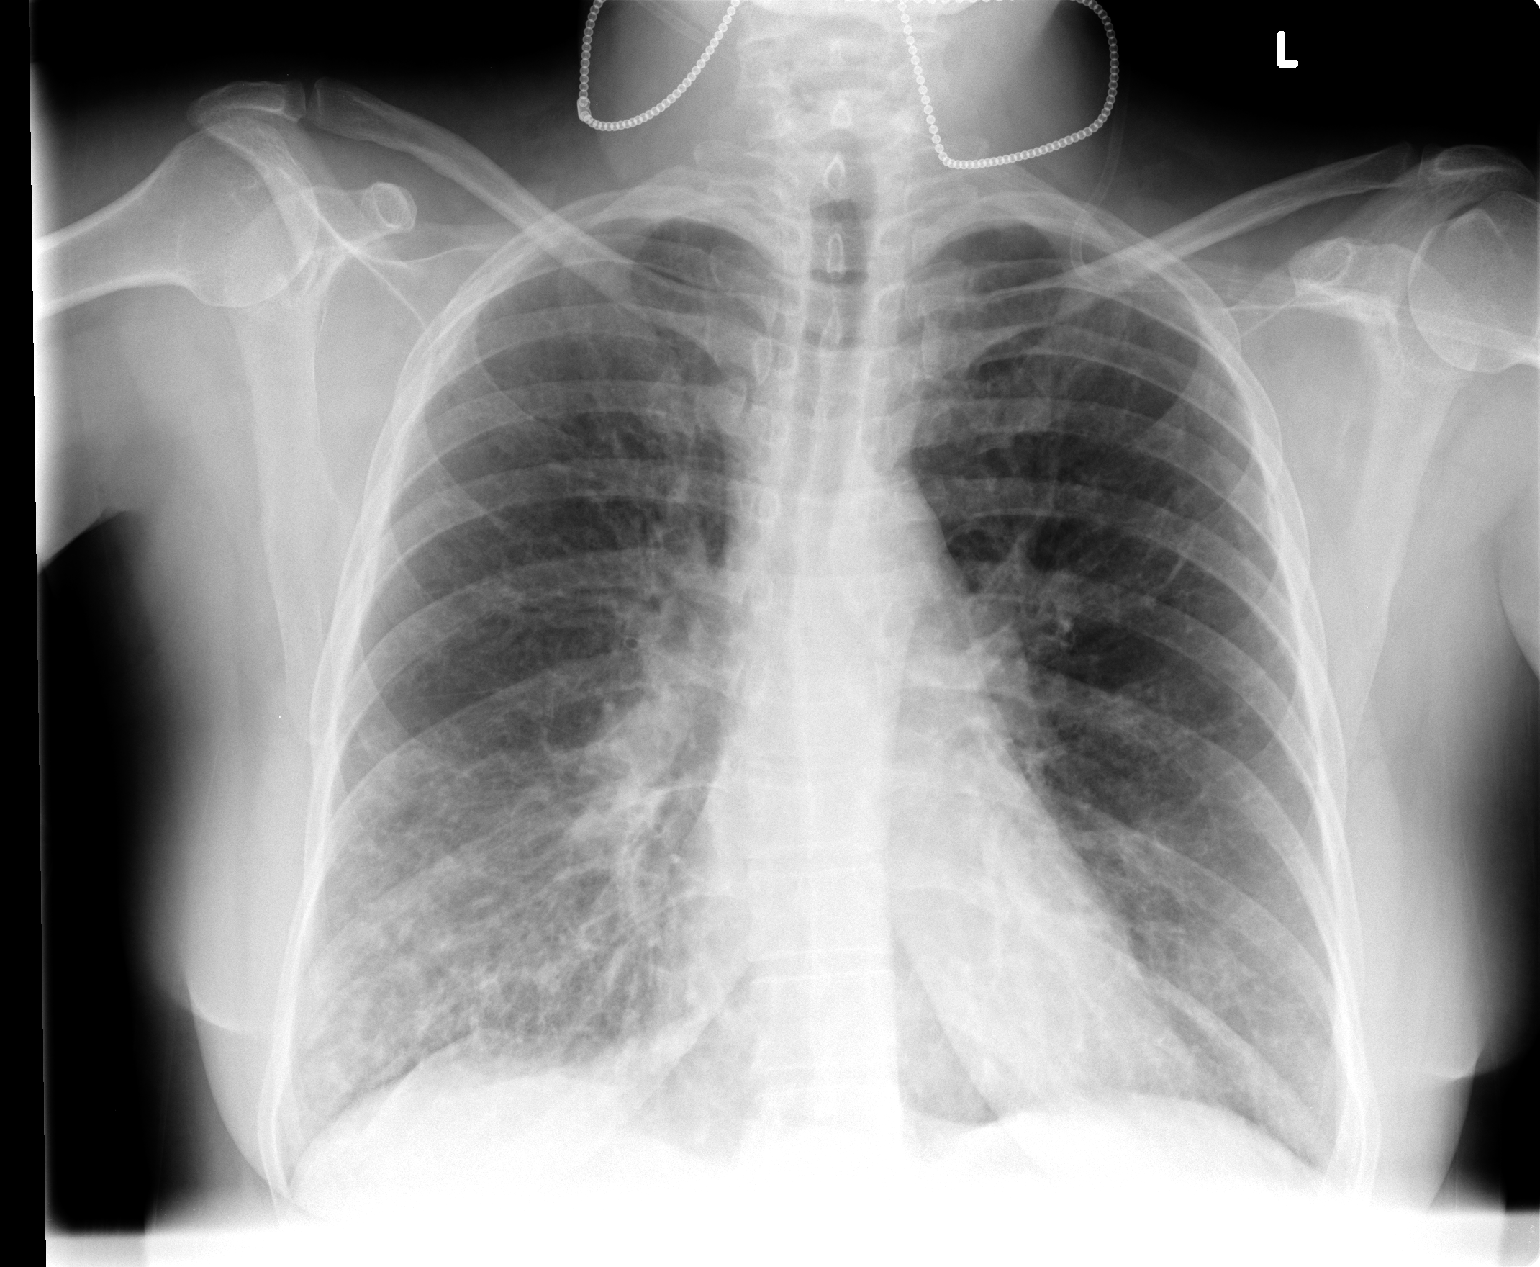

[view not recorded (2 of 2)]
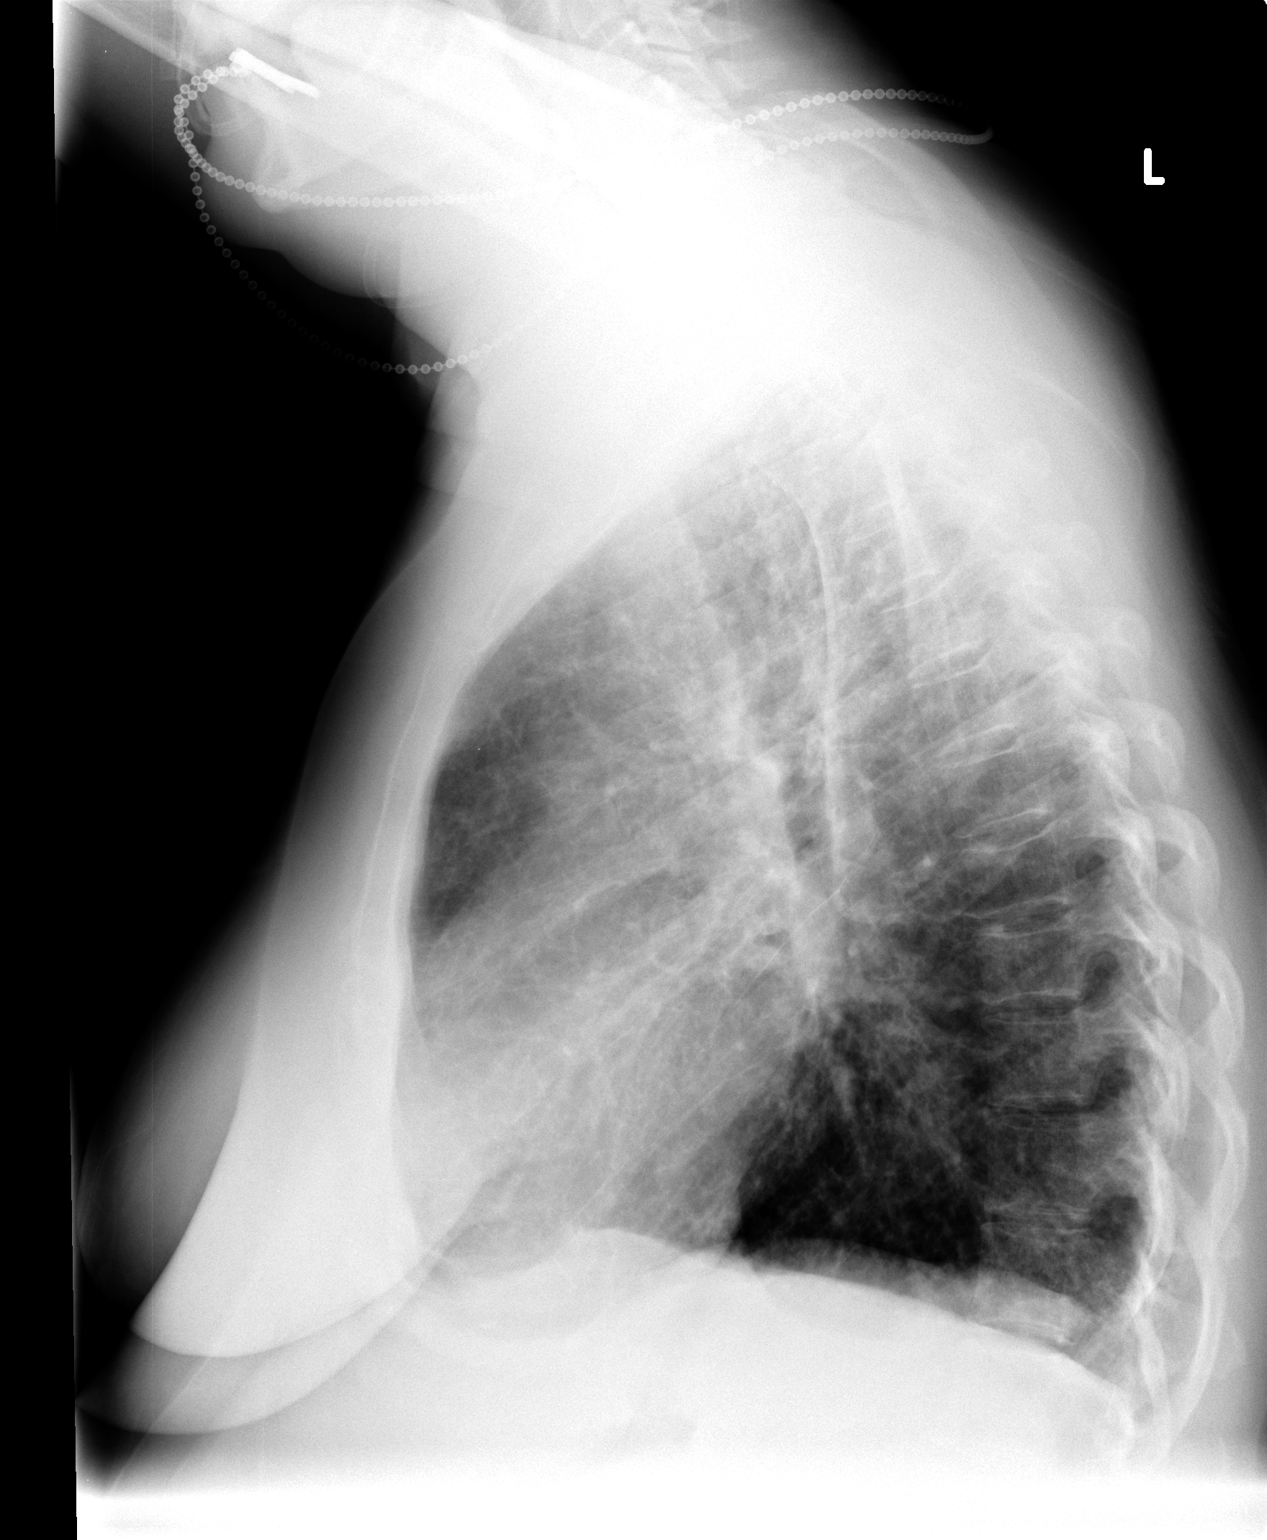

[2 of 2 positions shown; findings below may reference images not displayed]

FINDINGS: Developing patchy airspace disease in the right lung base suggesting
developing pneumonia. Hyperinflation and interstitial changes
probably underlying emphysema. No pleural effusion or pneumothorax.
Normal heart size and pulmonary vascularity.
IMPRESSION: Developing patchy airspace disease in the right lung base on chronic
emphysema.

## 2015-05-29 ENCOUNTER — Other Ambulatory Visit (HOSPITAL_COMMUNITY): Payer: Self-pay | Admitting: Pulmonary Disease

## 2015-05-29 DIAGNOSIS — R569 Unspecified convulsions: Secondary | ICD-10-CM

## 2015-05-31 ENCOUNTER — Ambulatory Visit (HOSPITAL_COMMUNITY): Payer: Medicaid Other

## 2015-06-06 ENCOUNTER — Ambulatory Visit (HOSPITAL_COMMUNITY)
Admission: RE | Admit: 2015-06-06 | Discharge: 2015-06-06 | Disposition: A | Discharge status: Home / Self Care | Payer: Medicaid Other | Source: Ambulatory Visit | Attending: Pulmonary Disease | Admitting: Pulmonary Disease

## 2015-06-06 DIAGNOSIS — H9209 Otalgia, unspecified ear: Secondary | ICD-10-CM | POA: Insufficient documentation

## 2015-06-06 DIAGNOSIS — R51 Headache: Secondary | ICD-10-CM | POA: Diagnosis not present

## 2015-06-06 DIAGNOSIS — R569 Unspecified convulsions: Secondary | ICD-10-CM | POA: Insufficient documentation

## 2015-08-01 IMAGING — CT CT MAXILLOFACIAL W/O CM
3 of 4 series · 13 of 47 positions shown, 15 images · non-contrast
Comparison: Head CTs without contrast 07/27/2009 and earlier.

CLINICAL DATA: 47-year-old female with sinusitis worse on the left.
Bilateral orbit pressure, left ear pain. Remote right ear surgery.
Initial encounter.

EXAM:
CT MAXILLOFACIAL WITHOUT CONTRAST
TECHNIQUE: Multidetector CT imaging of the maxillofacial structures was
performed. Multiplanar CT image reconstructions were also generated.
A small metallic BB was placed on the right temple in order to
reliably differentiate right from left.

[Series 3: stealth sinus sinus alg 1.0 · axial · 0.32mm/px · z∈[+81,+176]mm · 7 of 111 slices shown, 9 images]
[im 8/111  brain]
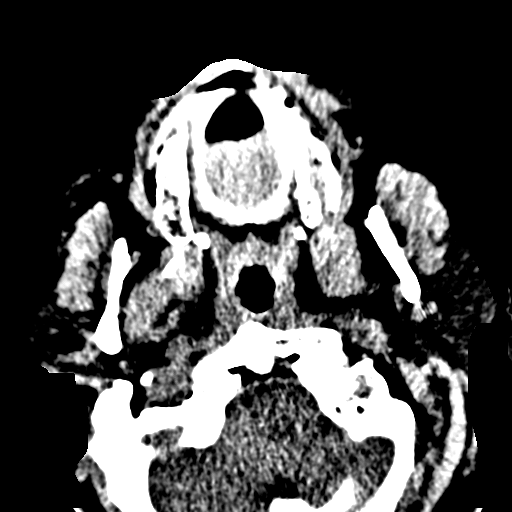
[im 8/111  bone]
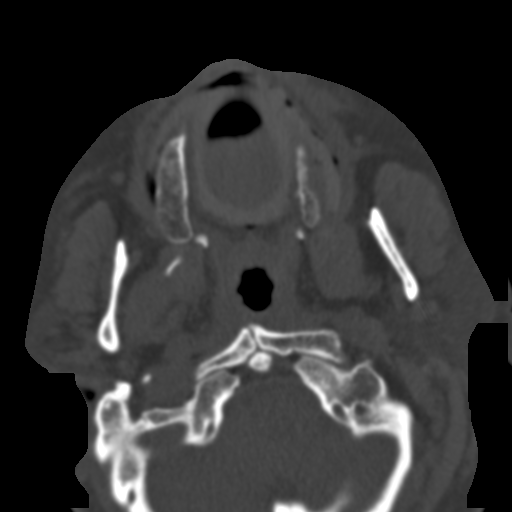
[im 24/111  bone]
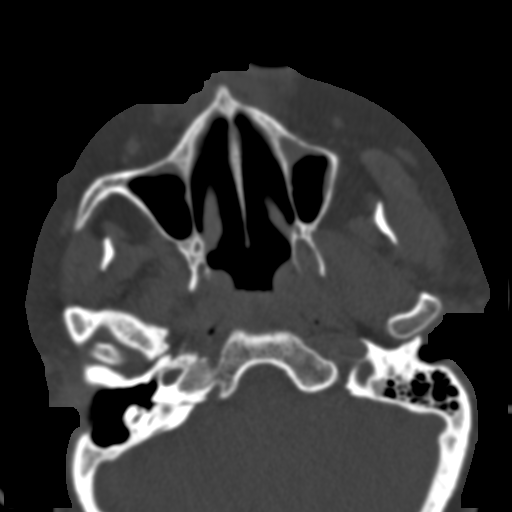
[im 40/111  bone]
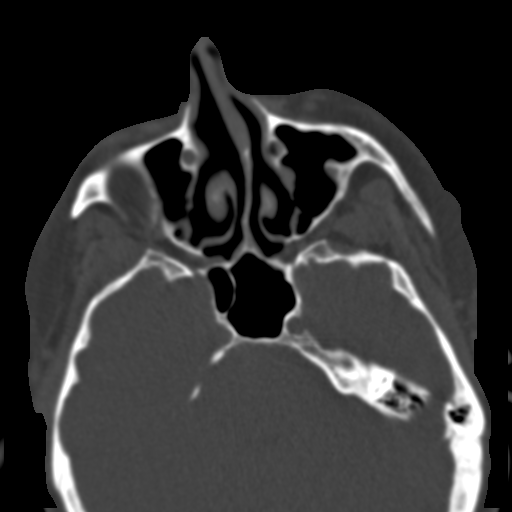
[im 56/111  bone]
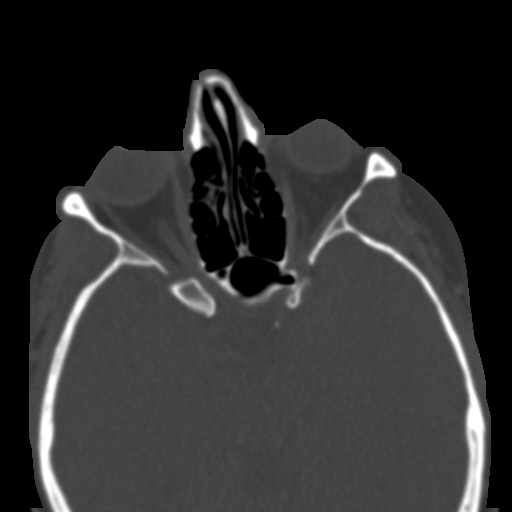
[im 71/111  brain]
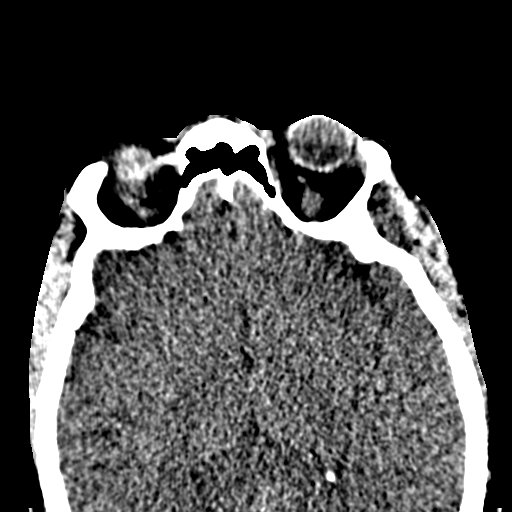
[im 71/111  bone]
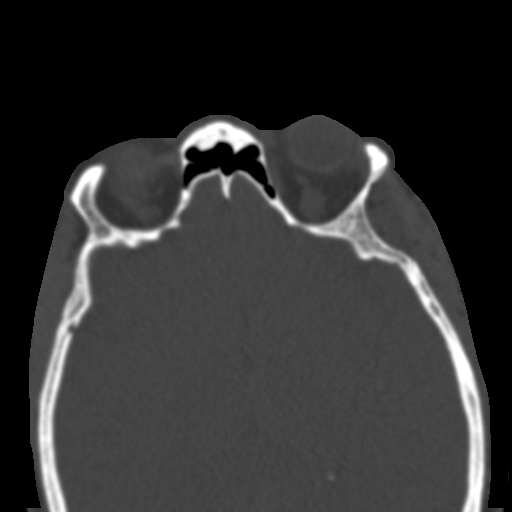
[im 87/111  bone]
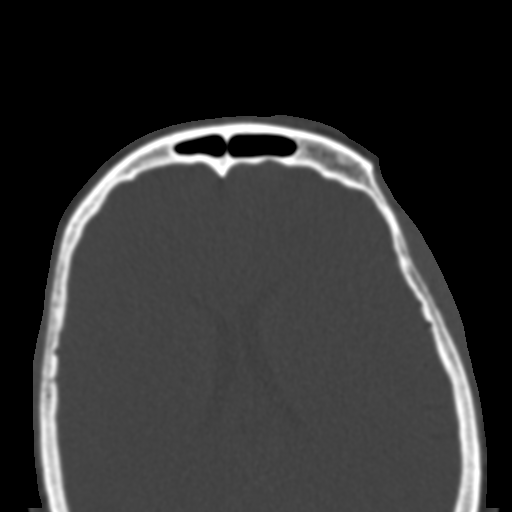
[im 103/111  bone]
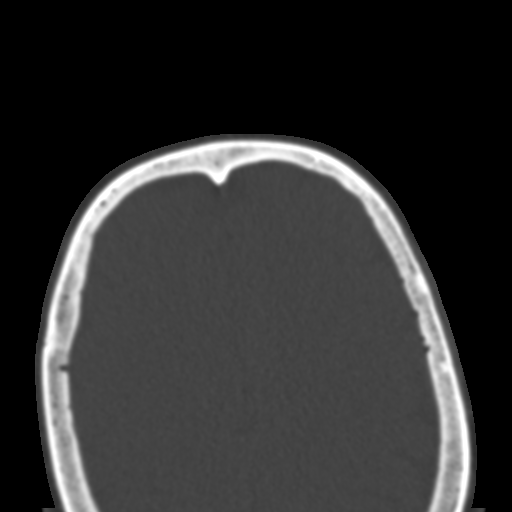

[Series 4: stealth sinus coro 2.0 · coronal · 0.21mm/px · 3 of 84 slices shown]
[im 28/84  bone]
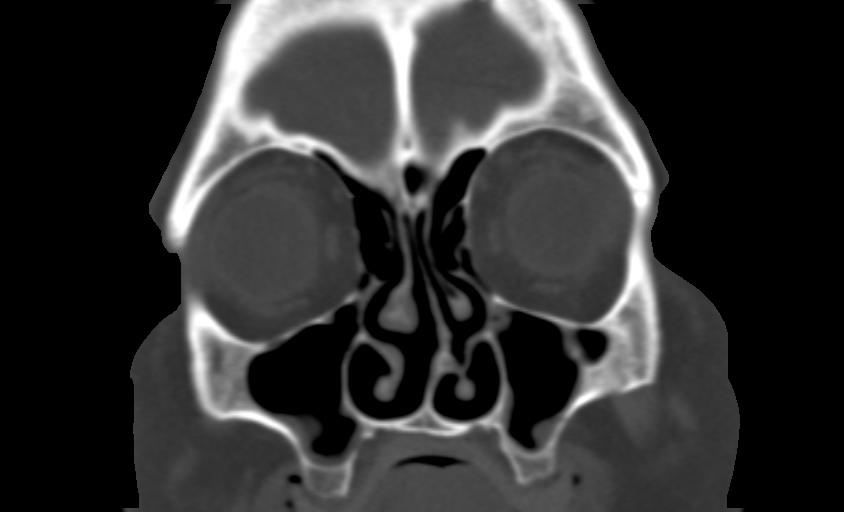
[im 37/84  bone]
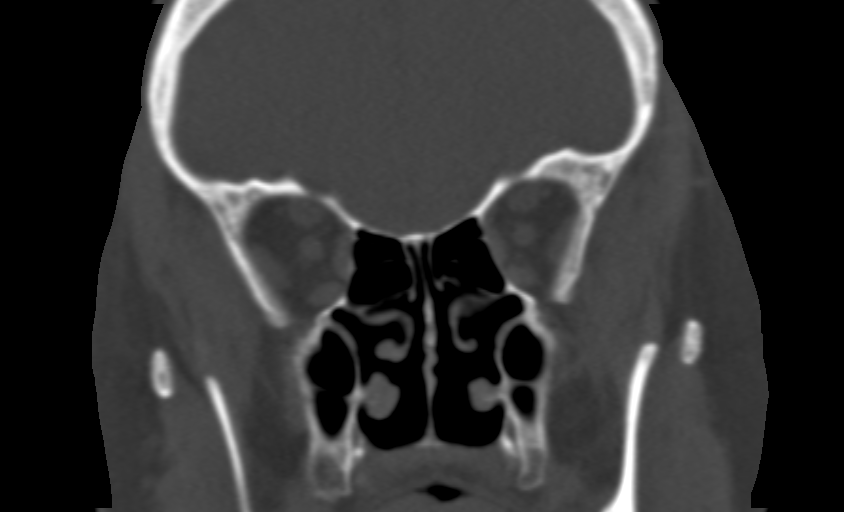
[im 47/84  bone]
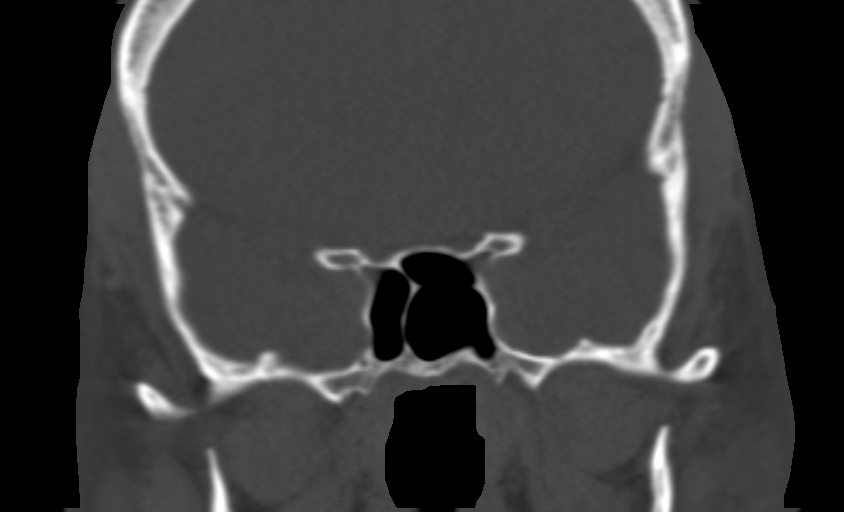

[Series 5: stealth sinus sag 2.0 · sagittal · 0.22mm/px · 3 of 86 slices shown]
[im 29/86  bone]
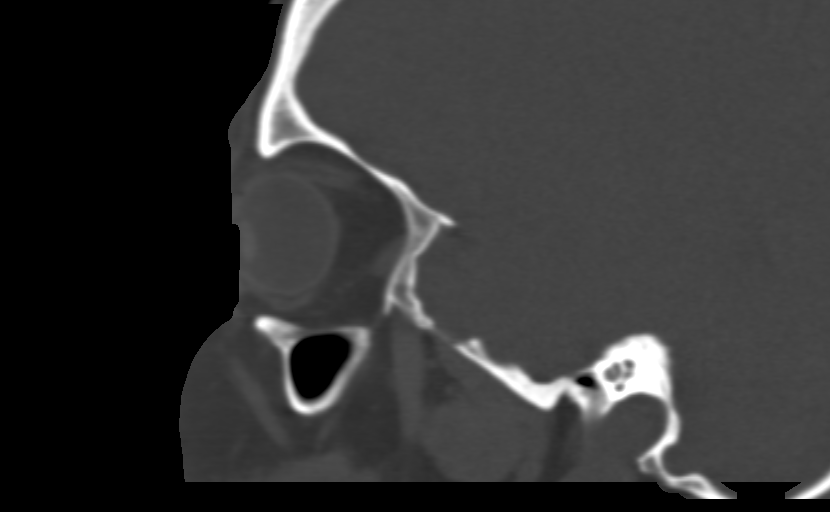
[im 43/86  bone]
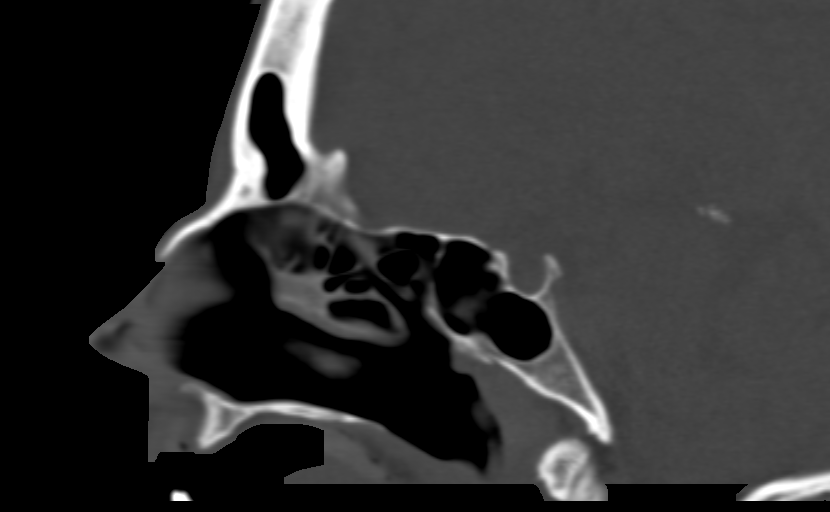
[im 57/86  bone]
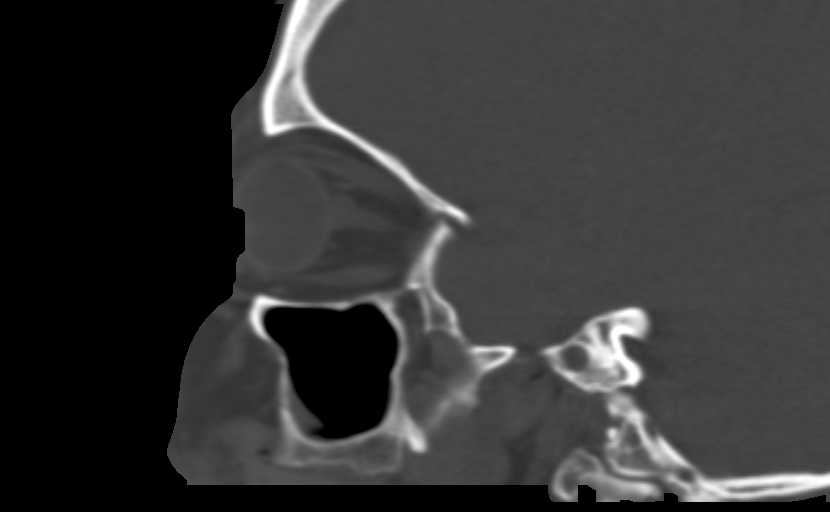

[13 of 47 positions shown; findings below may reference images not displayed]

FINDINGS: Visible non contrast brain parenchyma appear stable and within
normal limits. Visualized orbit soft tissues are within normal
limits. Visualized scalp soft tissues are within normal limits.
Negative visualized deep soft tissue spaces of the face.

Stable sequelae of right canal down mastoidectomy. Right side
ossicles not clearly identified. Left tympanic cavity and mastoids
are clear.

Sphenoid sinuses remain clear.

Ethmoid air cells remain clear.

Frontal sinuses remain clear.

Mild mucosal thickening in both maxillary sinuses, most pronounced
at the alveolar recesses. Ostial mucosal thickening greater on the
left. Minimal bubbly opacity in both maxillary sinuses. Since 6222,
maxillary sinus aeration has improved (moderate fluid levels at that
time).

Leftward nasal septal deviation and spurring. Congenital anatomic
variation of the C1 ring partially visible.
IMPRESSION: 1. Chronic maxillary sinusitis, currently with mild mucosal
thickening and minimal bubbly opacity. Ostial mucosal thickening
greater on the left.
2. Other paranasal sinuses are stable and clear.
3. Remote right mastoidectomy.

## 2015-08-29 IMAGING — CR DG CHEST 1V PORT
1 series · 1 of 1 positions shown · non-contrast
Comparison: 03/11/2014

CLINICAL DATA: Shortness of Breath

EXAM:
PORTABLE CHEST - 1 VIEW

[ap portable]
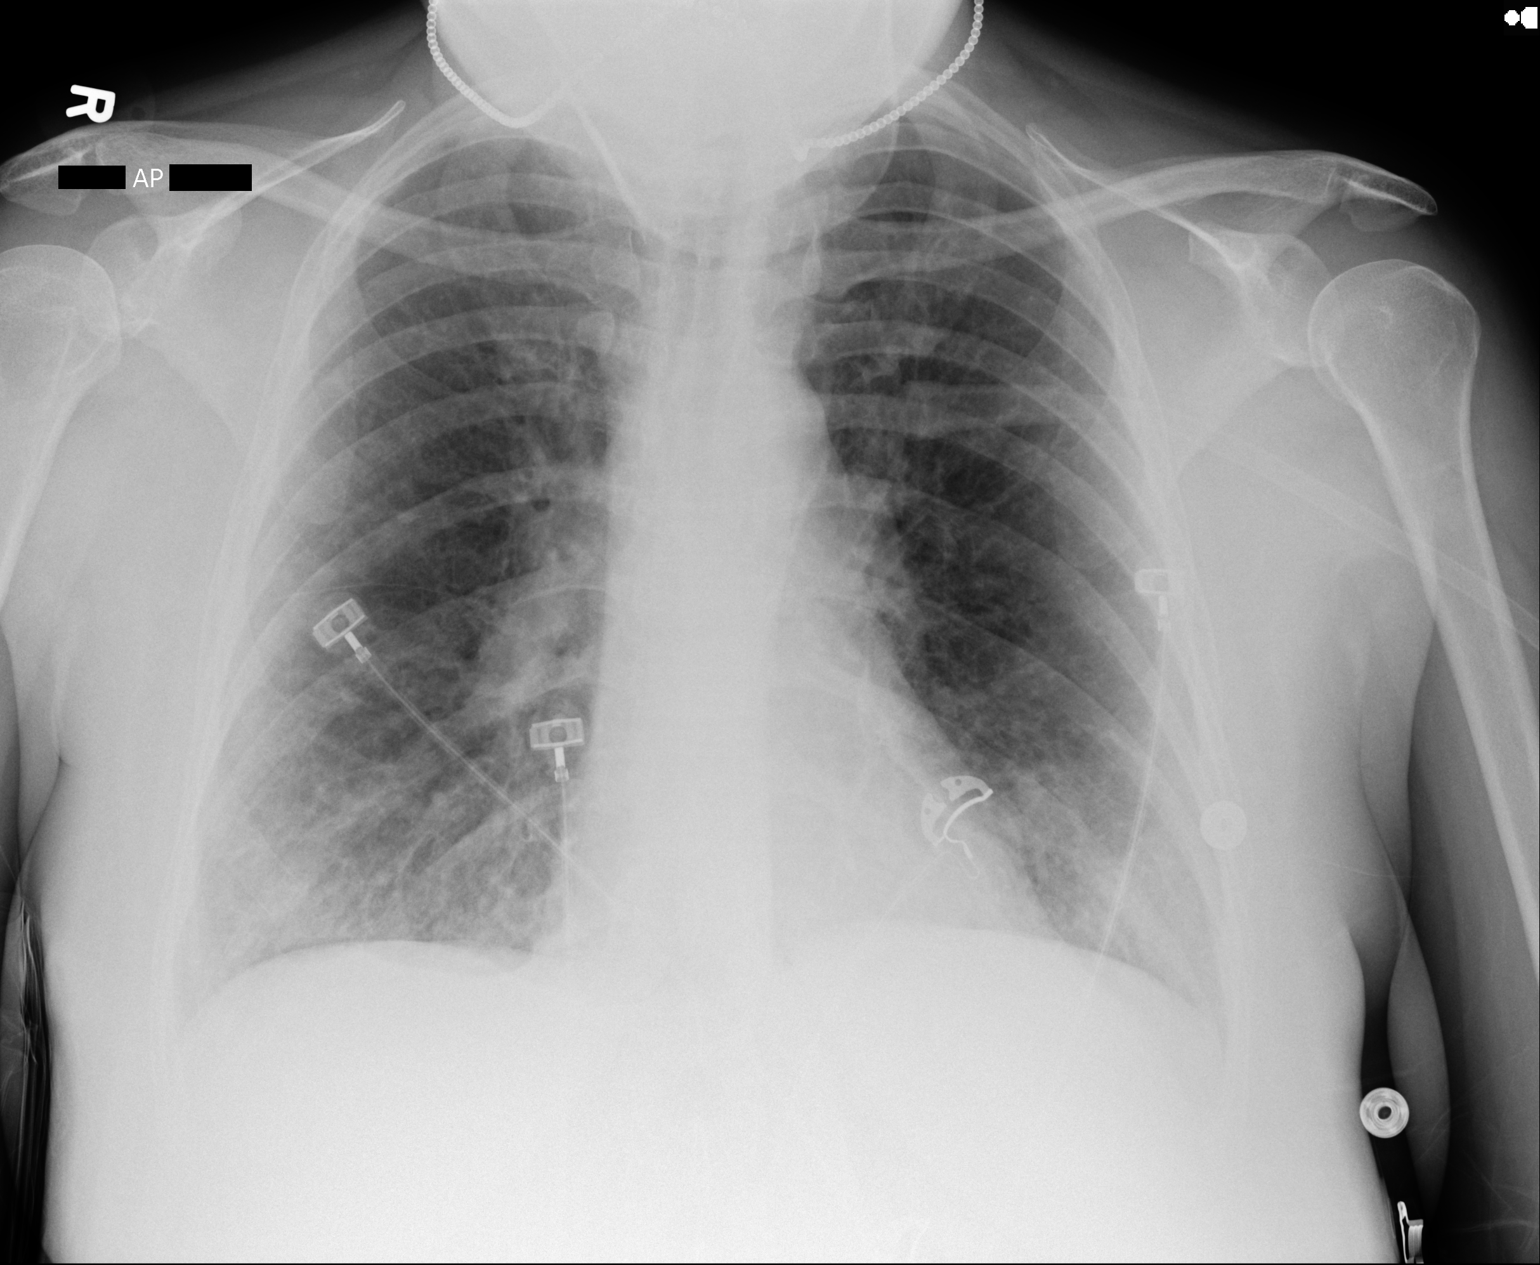

[1 of 1 positions shown; findings below may reference images not displayed]

FINDINGS: Cardiomediastinal silhouette is stable. Stable streaky right basilar
atelectasis or scarring. No segmental infiltrate or pulmonary edema.
IMPRESSION: No segmental infiltrate or pulmonary edema. Stable streaky right
basilar atelectasis or scarring.

## 2015-09-19 IMAGING — CR DG CHEST 2V
2 series · 2 of 2 positions shown · non-contrast
Comparison: 07/24/2014 and 03/11/2014

CLINICAL DATA: Altered mental status, difficulty breathing

EXAM:
CHEST  2 VIEW

[w chest pa]
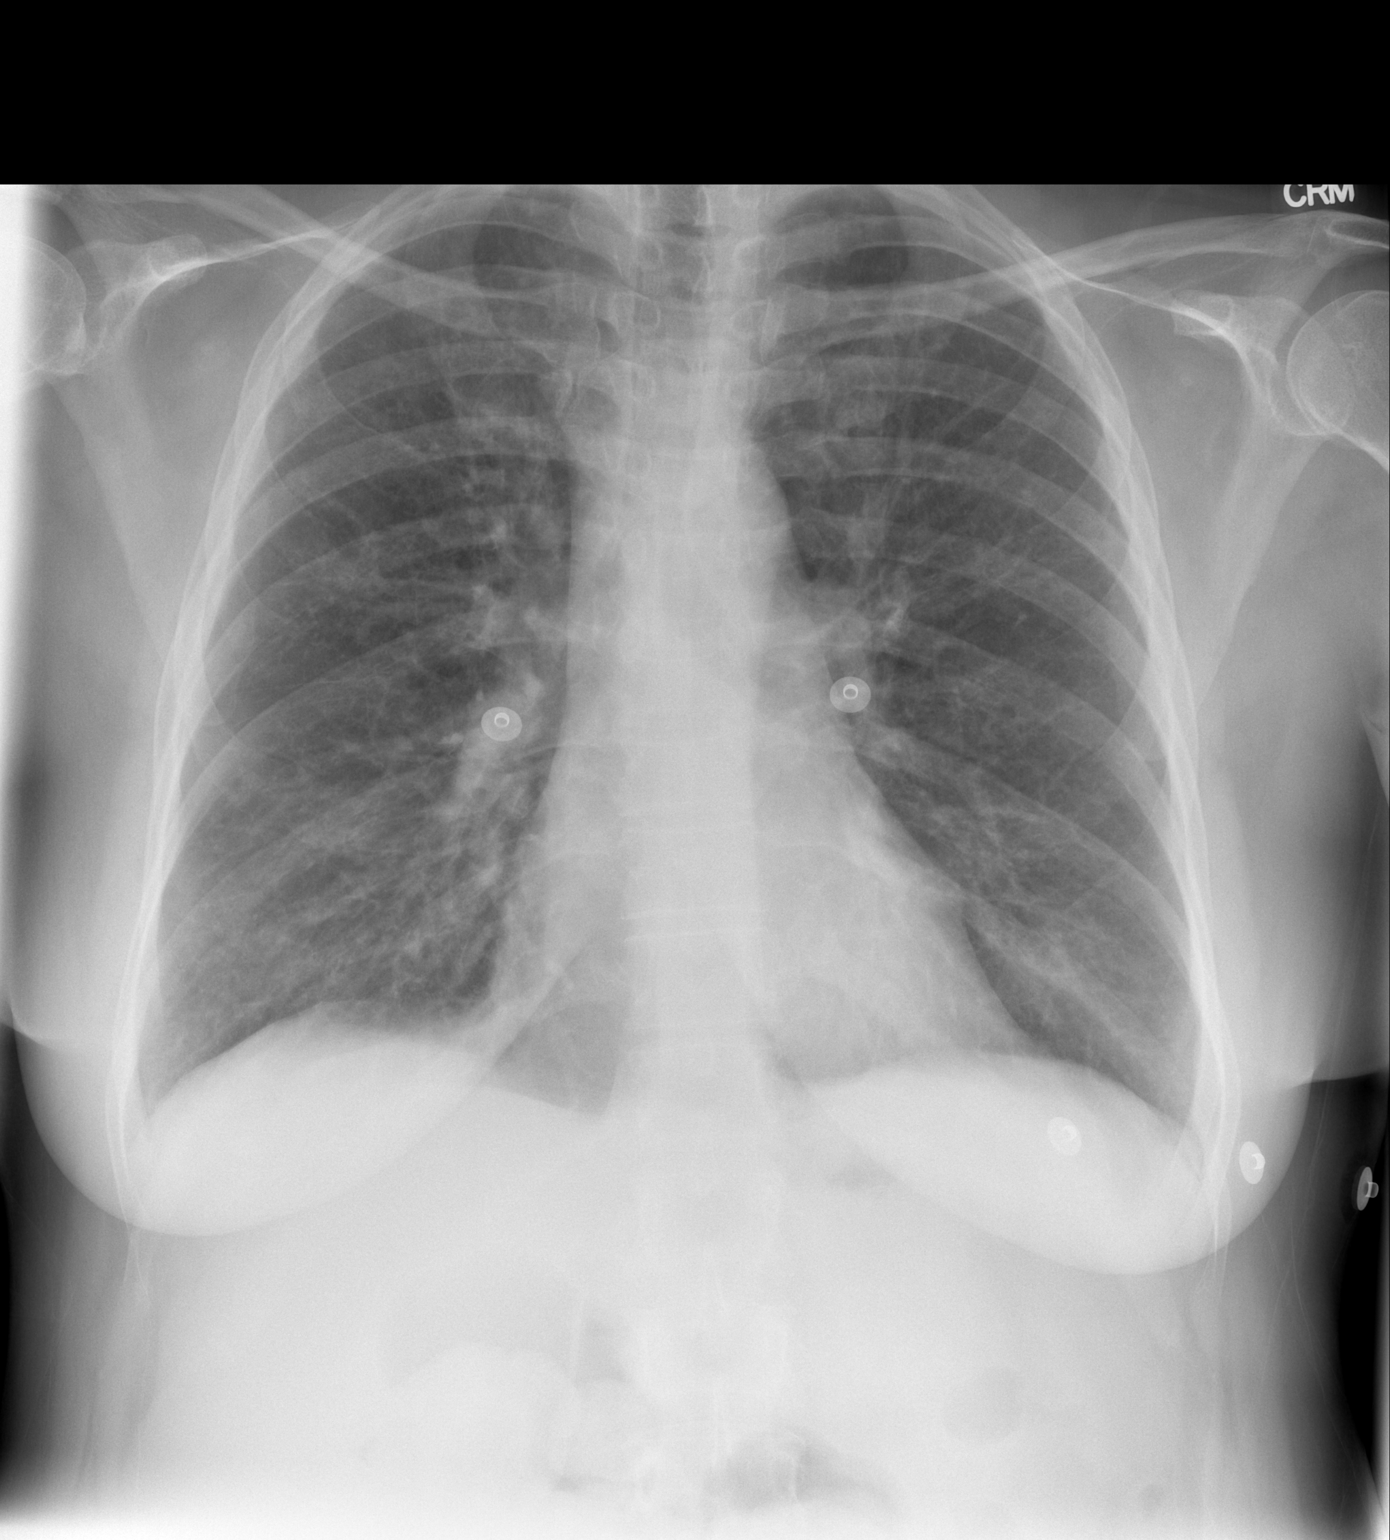

[w chest lat]
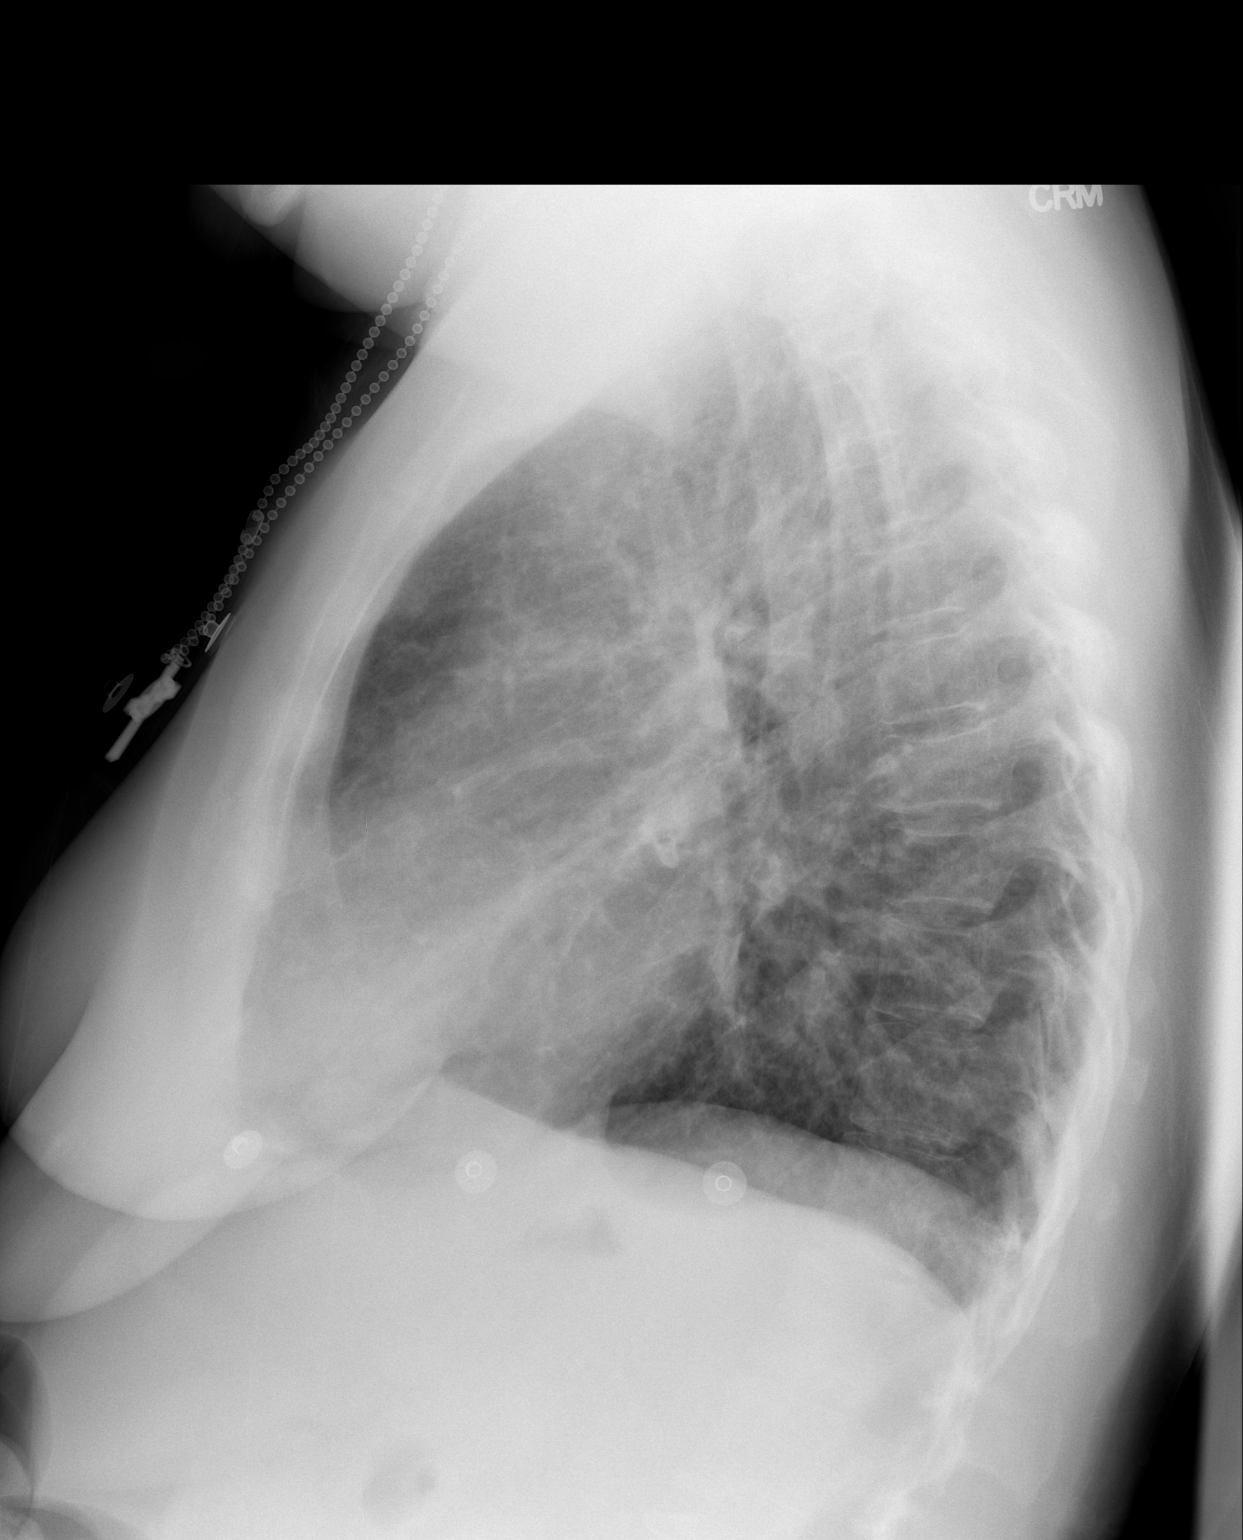

[2 of 2 positions shown; findings below may reference images not displayed]

FINDINGS: Cardiomediastinal silhouette is stable. No acute infiltrate or
pulmonary edema. Mild perihilar and infrahilar bronchitic changes.
Bony thorax is unremarkable.
IMPRESSION: No acute infiltrate or pulmonary edema. Mild perihilar infrahilar
bronchitic changes.

## 2015-10-15 ENCOUNTER — Inpatient Hospital Stay (HOSPITAL_COMMUNITY)
Admission: EM | Admit: 2015-10-15 | Discharge: 2015-10-19 | DRG: 871 | Disposition: A | Discharge status: Home / Self Care | Payer: Medicaid Other | Attending: Pulmonary Disease | Admitting: Pulmonary Disease

## 2015-10-15 ENCOUNTER — Encounter (HOSPITAL_COMMUNITY): Payer: Self-pay | Admitting: Emergency Medicine

## 2015-10-15 ENCOUNTER — Emergency Department (HOSPITAL_COMMUNITY): Payer: Medicaid Other

## 2015-10-15 DIAGNOSIS — F411 Generalized anxiety disorder: Secondary | ICD-10-CM | POA: Diagnosis present

## 2015-10-15 DIAGNOSIS — Z9981 Dependence on supplemental oxygen: Secondary | ICD-10-CM

## 2015-10-15 DIAGNOSIS — R058 Other specified cough: Secondary | ICD-10-CM

## 2015-10-15 DIAGNOSIS — J9620 Acute and chronic respiratory failure, unspecified whether with hypoxia or hypercapnia: Secondary | ICD-10-CM | POA: Diagnosis present

## 2015-10-15 DIAGNOSIS — A419 Sepsis, unspecified organism: Secondary | ICD-10-CM | POA: Diagnosis present

## 2015-10-15 DIAGNOSIS — I959 Hypotension, unspecified: Secondary | ICD-10-CM | POA: Diagnosis present

## 2015-10-15 DIAGNOSIS — J189 Pneumonia, unspecified organism: Secondary | ICD-10-CM | POA: Diagnosis present

## 2015-10-15 DIAGNOSIS — F17200 Nicotine dependence, unspecified, uncomplicated: Secondary | ICD-10-CM | POA: Diagnosis present

## 2015-10-15 DIAGNOSIS — J441 Chronic obstructive pulmonary disease with (acute) exacerbation: Secondary | ICD-10-CM | POA: Diagnosis present

## 2015-10-15 DIAGNOSIS — E876 Hypokalemia: Secondary | ICD-10-CM | POA: Diagnosis present

## 2015-10-15 DIAGNOSIS — I10 Essential (primary) hypertension: Secondary | ICD-10-CM | POA: Diagnosis present

## 2015-10-15 DIAGNOSIS — Z79891 Long term (current) use of opiate analgesic: Secondary | ICD-10-CM

## 2015-10-15 DIAGNOSIS — E119 Type 2 diabetes mellitus without complications: Secondary | ICD-10-CM

## 2015-10-15 DIAGNOSIS — J440 Chronic obstructive pulmonary disease with acute lower respiratory infection: Secondary | ICD-10-CM | POA: Diagnosis present

## 2015-10-15 DIAGNOSIS — J9621 Acute and chronic respiratory failure with hypoxia: Secondary | ICD-10-CM | POA: Diagnosis present

## 2015-10-15 DIAGNOSIS — F17210 Nicotine dependence, cigarettes, uncomplicated: Secondary | ICD-10-CM | POA: Diagnosis present

## 2015-10-15 DIAGNOSIS — Z7951 Long term (current) use of inhaled steroids: Secondary | ICD-10-CM

## 2015-10-15 DIAGNOSIS — R0602 Shortness of breath: Secondary | ICD-10-CM | POA: Diagnosis not present

## 2015-10-15 DIAGNOSIS — Z8249 Family history of ischemic heart disease and other diseases of the circulatory system: Secondary | ICD-10-CM | POA: Diagnosis not present

## 2015-10-15 DIAGNOSIS — K219 Gastro-esophageal reflux disease without esophagitis: Secondary | ICD-10-CM | POA: Diagnosis present

## 2015-10-15 DIAGNOSIS — R52 Pain, unspecified: Secondary | ICD-10-CM

## 2015-10-15 DIAGNOSIS — M549 Dorsalgia, unspecified: Secondary | ICD-10-CM | POA: Diagnosis present

## 2015-10-15 DIAGNOSIS — G8929 Other chronic pain: Secondary | ICD-10-CM | POA: Diagnosis present

## 2015-10-15 DIAGNOSIS — Z833 Family history of diabetes mellitus: Secondary | ICD-10-CM | POA: Diagnosis not present

## 2015-10-15 DIAGNOSIS — R05 Cough: Secondary | ICD-10-CM

## 2015-10-15 DIAGNOSIS — F419 Anxiety disorder, unspecified: Secondary | ICD-10-CM | POA: Diagnosis present

## 2015-10-15 DIAGNOSIS — J449 Chronic obstructive pulmonary disease, unspecified: Secondary | ICD-10-CM | POA: Diagnosis present

## 2015-10-15 DIAGNOSIS — R6510 Systemic inflammatory response syndrome (SIRS) of non-infectious origin without acute organ dysfunction: Secondary | ICD-10-CM | POA: Diagnosis present

## 2015-10-15 DIAGNOSIS — J439 Emphysema, unspecified: Secondary | ICD-10-CM

## 2015-10-15 HISTORY — DX: Generalized anxiety disorder: F41.1

## 2015-10-15 HISTORY — DX: Essential (primary) hypertension: I10

## 2015-10-15 LAB — BLOOD GAS, ARTERIAL
Acid-Base Excess: 8.6 mmol/L — ABNORMAL HIGH (ref 0.0–2.0)
Bicarbonate: 30.9 mEq/L — ABNORMAL HIGH (ref 20.0–24.0)
O2 Content: 5 L/min
O2 SAT: 92.4 %
PCO2 ART: 63.3 mmHg — AB (ref 35.0–45.0)
pH, Arterial: 7.352 (ref 7.350–7.450)
pO2, Arterial: 64.2 mmHg — ABNORMAL LOW (ref 80.0–100.0)

## 2015-10-15 LAB — COMPREHENSIVE METABOLIC PANEL
ALBUMIN: 3.5 g/dL (ref 3.5–5.0)
ALT: 10 U/L — AB (ref 14–54)
AST: 15 U/L (ref 15–41)
Alkaline Phosphatase: 108 U/L (ref 38–126)
Anion gap: 14 (ref 5–15)
BUN: 8 mg/dL (ref 6–20)
CHLORIDE: 89 mmol/L — AB (ref 101–111)
CO2: 32 mmol/L (ref 22–32)
CREATININE: 0.48 mg/dL (ref 0.44–1.00)
Calcium: 9.1 mg/dL (ref 8.9–10.3)
GFR calc Af Amer: >60 mL/min (ref >60)
GFR calc non Af Amer: >60 mL/min (ref >60)
GLUCOSE: 225 mg/dL — AB (ref 65–99)
Potassium: 3.2 mmol/L — ABNORMAL LOW (ref 3.5–5.1)
SODIUM: 135 mmol/L (ref 135–145)
Total Bilirubin: 0.4 mg/dL (ref 0.3–1.2)
Total Protein: 8.4 g/dL — ABNORMAL HIGH (ref 6.5–8.1)

## 2015-10-15 LAB — INFLUENZA PANEL BY PCR (TYPE A & B)
H1N1FLUPCR: NOT DETECTED
INFLAPCR: NEGATIVE
INFLBPCR: NEGATIVE

## 2015-10-15 LAB — CBC WITH DIFFERENTIAL/PLATELET
BASOS ABS: 0 10*3/uL (ref 0.0–0.1)
Basophils Relative: 0 %
EOS PCT: 0 %
Eosinophils Absolute: 0 10*3/uL (ref 0.0–0.7)
HCT: 41.4 % (ref 36.0–46.0)
Hemoglobin: 13.6 g/dL (ref 12.0–15.0)
LYMPHS ABS: 3.4 10*3/uL (ref 0.7–4.0)
Lymphocytes Relative: 11 %
MCH: 28.9 pg (ref 26.0–34.0)
MCHC: 32.9 g/dL (ref 30.0–36.0)
MCV: 88.1 fL (ref 78.0–100.0)
MONOS PCT: 10 %
Monocytes Absolute: 3 10*3/uL — ABNORMAL HIGH (ref 0.1–1.0)
Neutro Abs: 24.9 10*3/uL — ABNORMAL HIGH (ref 1.7–7.7)
Neutrophils Relative %: 79 %
PLATELETS: 516 10*3/uL — AB (ref 150–400)
RBC: 4.7 MIL/uL (ref 3.87–5.11)
RDW: 12.4 % (ref 11.5–15.5)
WBC: 31.3 10*3/uL — ABNORMAL HIGH (ref 4.0–10.5)

## 2015-10-15 LAB — LACTIC ACID, PLASMA
LACTIC ACID, VENOUS: 1.3 mmol/L (ref 0.5–2.0)
Lactic Acid, Venous: 1.4 mmol/L (ref 0.5–2.0)
Lactic Acid, Venous: 1.1 mmol/L (ref 0.5–2.0)
Lactic Acid, Venous: 0.5 mmol/L (ref 0.5–2.0)

## 2015-10-15 LAB — STREP PNEUMONIAE URINARY ANTIGEN: Strep Pneumo Urinary Antigen: NEGATIVE

## 2015-10-15 LAB — GLUCOSE, CAPILLARY
Glucose-Capillary: 139 mg/dL — ABNORMAL HIGH (ref 65–99)
Glucose-Capillary: 262 mg/dL — ABNORMAL HIGH (ref 65–99)

## 2015-10-15 LAB — TROPONIN I: Troponin I: <0.03 ng/mL (ref <0.031)

## 2015-10-15 LAB — MRSA PCR SCREENING: MRSA BY PCR: NEGATIVE

## 2015-10-15 LAB — PROCALCITONIN: Procalcitonin: 4.67 ng/mL

## 2015-10-15 MED ORDER — ALBUTEROL (5 MG/ML) CONTINUOUS INHALATION SOLN
10.0000 mg/h | INHALATION_SOLUTION | Freq: Once | RESPIRATORY_TRACT | Status: AC
  Administered 2015-10-15: 10 mg/h via RESPIRATORY_TRACT

## 2015-10-15 MED ORDER — CLONAZEPAM 0.5 MG PO TABS
0.5000 mg | ORAL_TABLET | Freq: Three times a day (TID) | ORAL | Status: DC | PRN
  Administered 2015-10-17 – 2015-10-18 (×4): 0.5 mg via ORAL
  Filled 2015-10-15 (×4): qty 1

## 2015-10-15 MED ORDER — DEXTROSE 5 % IV SOLN
2.0000 g | Freq: Once | INTRAVENOUS | Status: AC
  Administered 2015-10-15: 2 g via INTRAVENOUS
  Filled 2015-10-15: qty 2

## 2015-10-15 MED ORDER — HYDROCODONE-ACETAMINOPHEN 5-325 MG PO TABS
1.0000 | ORAL_TABLET | Freq: Four times a day (QID) | ORAL | Status: DC | PRN
  Administered 2015-10-15 – 2015-10-17 (×7): 2 via ORAL
  Filled 2015-10-15 (×7): qty 2

## 2015-10-15 MED ORDER — ALBUTEROL SULFATE (2.5 MG/3ML) 0.083% IN NEBU: 2.5000 mg | INHALATION_SOLUTION | RESPIRATORY_TRACT | Status: DC | PRN

## 2015-10-15 MED ORDER — SODIUM CHLORIDE 0.9 % IV SOLN: INTRAVENOUS | Status: DC

## 2015-10-15 MED ORDER — SODIUM CHLORIDE 0.9 % IV BOLUS (SEPSIS)
1000.0000 mL | INTRAVENOUS | Status: AC
  Administered 2015-10-15 (×2): 1000 mL via INTRAVENOUS

## 2015-10-15 MED ORDER — IBUPROFEN 800 MG PO TABS
800.0000 mg | ORAL_TABLET | Freq: Once | ORAL | Status: AC
  Administered 2015-10-15: 800 mg via ORAL
  Filled 2015-10-15: qty 1

## 2015-10-15 MED ORDER — ONDANSETRON HCL 4 MG/2ML IJ SOLN
4.0000 mg | Freq: Four times a day (QID) | INTRAMUSCULAR | Status: DC | PRN
  Administered 2015-10-15: 4 mg via INTRAVENOUS
  Filled 2015-10-15: qty 2

## 2015-10-15 MED ORDER — ENOXAPARIN SODIUM 40 MG/0.4ML ~~LOC~~ SOLN
40.0000 mg | SUBCUTANEOUS | Status: DC
  Administered 2015-10-15 – 2015-10-18 (×4): 40 mg via SUBCUTANEOUS
  Filled 2015-10-15 (×4): qty 0.4

## 2015-10-15 MED ORDER — ONDANSETRON HCL 4 MG/2ML IJ SOLN
4.0000 mg | Freq: Once | INTRAMUSCULAR | Status: AC
  Administered 2015-10-15: 4 mg via INTRAMUSCULAR
  Filled 2015-10-15: qty 2

## 2015-10-15 MED ORDER — BUDESONIDE-FORMOTEROL FUMARATE 160-4.5 MCG/ACT IN AERO
2.0000 | INHALATION_SPRAY | Freq: Two times a day (BID) | RESPIRATORY_TRACT | Status: DC
  Administered 2015-10-15 – 2015-10-19 (×8): 2 via RESPIRATORY_TRACT
  Filled 2015-10-15 (×2): qty 6

## 2015-10-15 MED ORDER — SODIUM CHLORIDE 0.9 % IV SOLN
Freq: Once | INTRAVENOUS | Status: AC
  Administered 2015-10-15: 10:00:00 via INTRAVENOUS

## 2015-10-15 MED ORDER — CYCLOSPORINE 0.05 % OP EMUL
1.0000 [drp] | Freq: Two times a day (BID) | OPHTHALMIC | Status: DC
  Administered 2015-10-15 – 2015-10-19 (×8): 1 [drp] via OPHTHALMIC
  Filled 2015-10-15 (×12): qty 1

## 2015-10-15 MED ORDER — IPRATROPIUM BROMIDE 0.02 % IN SOLN
RESPIRATORY_TRACT | Status: AC
  Administered 2015-10-15: 0.5 mg
  Filled 2015-10-15: qty 2.5

## 2015-10-15 MED ORDER — GUAIFENESIN ER 600 MG PO TB12
1200.0000 mg | ORAL_TABLET | Freq: Two times a day (BID) | ORAL | Status: DC
  Administered 2015-10-15 – 2015-10-19 (×8): 1200 mg via ORAL
  Filled 2015-10-15 (×8): qty 2

## 2015-10-15 MED ORDER — ACETAMINOPHEN 325 MG PO TABS
650.0000 mg | ORAL_TABLET | Freq: Four times a day (QID) | ORAL | Status: DC | PRN
  Administered 2015-10-16 (×2): 650 mg via ORAL
  Filled 2015-10-15 (×2): qty 2

## 2015-10-15 MED ORDER — PREGABALIN 25 MG PO CAPS
50.0000 mg | ORAL_CAPSULE | Freq: Three times a day (TID) | ORAL | Status: DC
  Administered 2015-10-15 – 2015-10-17 (×6): 50 mg via ORAL
  Filled 2015-10-15 (×7): qty 2

## 2015-10-15 MED ORDER — VANCOMYCIN HCL 10 G IV SOLR
1250.0000 mg | Freq: Once | INTRAVENOUS | Status: AC
  Administered 2015-10-15: 1250 mg via INTRAVENOUS
  Filled 2015-10-15: qty 1250

## 2015-10-15 MED ORDER — SODIUM CHLORIDE 0.9 % IV SOLN
INTRAVENOUS | Status: DC
  Administered 2015-10-15: 17:00:00 via INTRAVENOUS
  Administered 2015-10-16: 1000 mL via INTRAVENOUS
  Administered 2015-10-17 – 2015-10-18 (×3): via INTRAVENOUS

## 2015-10-15 MED ORDER — DEXTROSE 5 % IV SOLN
500.0000 mg | INTRAVENOUS | Status: DC
  Administered 2015-10-15 – 2015-10-18 (×4): 500 mg via INTRAVENOUS
  Filled 2015-10-15 (×6): qty 500

## 2015-10-15 MED ORDER — POTASSIUM CHLORIDE CRYS ER 20 MEQ PO TBCR
40.0000 meq | EXTENDED_RELEASE_TABLET | Freq: Once | ORAL | Status: AC
  Administered 2015-10-15: 40 meq via ORAL
  Filled 2015-10-15: qty 2

## 2015-10-15 MED ORDER — HYDROCODONE-ACETAMINOPHEN 5-325 MG PO TABS
1.0000 | ORAL_TABLET | Freq: Once | ORAL | Status: AC
  Administered 2015-10-15: 1 via ORAL
  Filled 2015-10-15: qty 1

## 2015-10-15 MED ORDER — PANTOPRAZOLE SODIUM 40 MG PO TBEC
40.0000 mg | DELAYED_RELEASE_TABLET | Freq: Every day | ORAL | Status: DC
  Administered 2015-10-15 – 2015-10-19 (×5): 40 mg via ORAL
  Filled 2015-10-15 (×5): qty 1

## 2015-10-15 MED ORDER — CEFTRIAXONE SODIUM 1 G IJ SOLR
1.0000 g | INTRAMUSCULAR | Status: DC
  Administered 2015-10-15 – 2015-10-18 (×4): 1 g via INTRAVENOUS
  Filled 2015-10-15 (×6): qty 10

## 2015-10-15 MED ORDER — LISINOPRIL 10 MG PO TABS
20.0000 mg | ORAL_TABLET | Freq: Every day | ORAL | Status: DC
  Administered 2015-10-15 – 2015-10-19 (×5): 20 mg via ORAL
  Filled 2015-10-15 (×5): qty 2

## 2015-10-15 MED ORDER — ALBUTEROL (5 MG/ML) CONTINUOUS INHALATION SOLN
INHALATION_SOLUTION | RESPIRATORY_TRACT | Status: AC
  Administered 2015-10-15: 11:00:00
  Filled 2015-10-15: qty 20

## 2015-10-15 MED ORDER — IPRATROPIUM-ALBUTEROL 0.5-2.5 (3) MG/3ML IN SOLN
3.0000 mL | Freq: Four times a day (QID) | RESPIRATORY_TRACT | Status: DC
  Administered 2015-10-15 – 2015-10-19 (×15): 3 mL via RESPIRATORY_TRACT
  Filled 2015-10-15 (×15): qty 3

## 2015-10-15 MED ORDER — OXYCODONE HCL ER 10 MG PO T12A
10.0000 mg | EXTENDED_RELEASE_TABLET | Freq: Two times a day (BID) | ORAL | Status: DC
  Administered 2015-10-15 – 2015-10-19 (×8): 10 mg via ORAL
  Filled 2015-10-15 (×8): qty 1

## 2015-10-15 MED ORDER — POTASSIUM CHLORIDE CRYS ER 20 MEQ PO TBCR
40.0000 meq | EXTENDED_RELEASE_TABLET | ORAL | Status: AC
  Administered 2015-10-15: 40 meq via ORAL
  Filled 2015-10-15: qty 2

## 2015-10-15 NOTE — H&P (Signed)
[facility] History and Physical  [name] ZOX:[id number] DOB: Mar 28, 1966 DOA: 10/15/2015  Referring physician: Ivery Quale, PA in ED PCP: Joyce Maudlin, MD   Chief Complaint: shortness of breath  HPI: [name] is a 49 y.o. female with a history of oxygen dependent COPD reports feeling ill for the past 2-3 days. She reports a productive cough, increased wheezing, worsening shortness of breath. She also reports that she is running fevers. She has had nausea but no vomiting. No diarrhea. No dysuria. She does describe pain in her chest that is worse with coughing. Her chest is also tender to palpation, worse on the right side. She was evaluated in the emergency room where imaging indicated evidence of pneumonia. She was also noted to be febrile with a leukocytosis of 31,000. She is being admitted for further treatment.   Review of Systems:  Pertinent positives as per HPI, otherwise negative  Past Medical History  Diagnosis Date  . Migraine headache   . On home O2   . COPD (chronic obstructive pulmonary disease) (HCC)   . Chronic back pain   . Hyperglycemia, drug-induced     steroid induced hyperglycemia  . ARDS (adult respiratory distress syndrome) [facility])     Jan 2011  . Diabetes mellitus   . Pneumonia   . Angina   . Asthma   . Shortness of breath   . Recurrent upper respiratory infection (URI)   . GERD (gastroesophageal reflux disease) 12/21/2012  . Anxiety state 10/15/2015  . HTN (hypertension) 10/15/2015   Past Surgical History  Procedure Laterality Date  . Tubal ligation    . Uterine ablasion    . C-section    . Tracheostomy      decannulated 12/2009   Social History:  reports that she has been smoking Cigarettes.  She has a 8.25 pack-year smoking history. She does not have any smokeless tobacco history on file. She reports that she does not drink alcohol or use illicit drugs.  Allergies  Allergen Reactions  . Penicillins Other (See Comments)   convulsions  . Levaquin [Levofloxacin In D5w] Itching and Swelling  . Morphine Nausea Only    Family History  Problem Relation Age of Onset  . Coronary artery disease Brother   . Diabetes Other   . Cancer Other   . Hypertension Other     Prior to Admission medications   Medication Sig Start Date End Date Taking? Authorizing Provider  acetaminophen (TYLENOL) 500 MG tablet Take 500 mg by mouth every 6 (six) hours as needed for fever.    Yes Historical Provider, MD  albuterol (PROAIR HFA) 108 (90 BASE) MCG/ACT inhaler Inhale 2 puffs into the lungs every 6 (six) hours as needed. For shortness of breATH   Yes Historical Provider, MD  albuterol (PROVENTIL) (2.5 MG/3ML) 0.083% nebulizer solution Take 2.5 mg by nebulization every 4 (four) hours as needed. For shortness of breath  03/19/11  Yes Joyce S Parrett, NP  budesonide-formoterol (SYMBICORT) 160-4.5 MCG/ACT inhaler Inhale 2 puffs into the lungs 2 (two) times daily.   Yes Historical Provider, MD  clonazePAM (KLONOPIN) 0.5 MG tablet Take 0.5 mg by mouth 3 (three) times daily as needed for anxiety.   Yes Historical Provider, MD  lisinopril (PRINIVIL,ZESTRIL) 20 MG tablet Take 20 mg by mouth daily.   Yes Historical Provider, MD  OxyCODONE (OXYCONTIN) 10 mg T12A Take 1 tablet (10 mg total) by mouth every 12 (twelve) hours. 04/24/13  Yes Kari Baars, MD  Oxymetazoline HCl (  AFRIN NASAL SPRAY NA) Place 1 spray into the nose daily as needed (Congestion).    Yes Historical Provider, MD  pantoprazole (PROTONIX) 40 MG tablet Take 40 mg by mouth daily.   Yes Historical Provider, MD  pregabalin (LYRICA) 50 MG capsule Take 50 mg by mouth every 8 (eight) hours.    Yes Historical Provider, MD  promethazine (PHENERGAN) 25 MG tablet Take 25 mg by mouth every 6 (six) hours as needed for nausea.    Yes Historical Provider, MD  RESTASIS 0.05 % ophthalmic emulsion Place 1 drop into both eyes 2 (two) times daily. 09/28/15  Yes Historical Provider, MD  tiotropium  (SPIRIVA) 18 MCG inhalation capsule Place 18 mcg into inhaler and inhale daily.   Yes Historical Provider, MD   Physical Exam: Filed Vitals:   10/15/15 1400 10/15/15 1500 10/15/15 1504 10/15/15 1512  BP: 110/64 109/66    Pulse: 110 103    Temp:    97 F (36.1 C)  TempSrc:    Oral  Resp: 22 25    Height:   5' (1.524 m)   Weight:   62.5 kg (137 lb 12.6 oz)   SpO2: 97% 97%      Wt Readings from Last 3 Encounters:  10/15/15 62.5 kg (137 lb 12.6 oz)  08/15/14 64.1 kg (141 lb 5 oz)  07/24/14 68.947 kg (152 lb)    General:  Appears calm and comfortable Eyes: PERRL, normal lids, irises & conjunctiva ENT: grossly normal hearing, lips & tongue Neck: no LAD, masses or thyromegaly Cardiovascular: regular, tachycardic, no m/r/g. No LE edema. Telemetry: SR, no arrhythmias  Respiratory: no wheezing, crackles at bases Abdomen: soft, ntnd Skin: no rash or induration seen on limited exam Musculoskeletal: grossly normal tone BUE/BLE Psychiatric: grossly normal mood and affect, speech fluent and appropriate Neurologic: grossly non-focal.          Labs on Admission:  Basic Metabolic Panel:  Recent Labs Lab 10/15/15 1017  NA 135  K 3.2*  CL 89*  CO2 32  GLUCOSE 225*  BUN 8  CREATININE 0.48  CALCIUM 9.1   Liver Function Tests:  Recent Labs Lab 10/15/15 1017  AST 15  ALT 10*  ALKPHOS 108  BILITOT 0.4  PROT 8.4*  ALBUMIN 3.5   No results for input(s): LIPASE, AMYLASE in the last 168 hours. No results for input(s): AMMONIA in the last 168 hours. CBC:  Recent Labs Lab 10/15/15 1017  WBC 31.3*  NEUTROABS 24.9*  HGB 13.6  HCT 41.4  MCV 88.1  PLT 516*   Cardiac Enzymes:  Recent Labs Lab 10/15/15 1017  TROPONINI <0.03    BNP (last 3 results) No results for input(s): BNP in the last 8760 hours.  ProBNP (last 3 results) No results for input(s): PROBNP in the last 8760 hours.  CBG: No results for input(s): GLUCAP in the last 168 hours.  Radiological Exams  on Admission: Dg Chest Port 1 View  10/15/2015  CLINICAL DATA:  Cough and congestion EXAM: PORTABLE CHEST - 1 VIEW COMPARISON:  01/29/2015 FINDINGS: Cardiac shadow is within normal limits. The lungs are well aerated bilaterally. Patchy infiltrative changes are noted in the bases bilaterally as well as within the right upper lobe. No sizable effusion is seen. No bony abnormality is noted. IMPRESSION: Patchy infiltrates bilaterally.  Continued followup is recommended. Electronically Signed   By: Alcide Clever M.D.   On: 10/15/2015 10:35    EKG: Independently reviewed. Sinus tachycardia  Assessment/Plan Principal Problem:  Sepsis (HCC) Active Problems:   TOBACCO ABUSE   COPD (chronic obstructive pulmonary disease) (HCC)   GERD (gastroesophageal reflux disease)   CAP (community acquired pneumonia)   Community acquired pneumonia   Acute-on-chronic respiratory failure (HCC)   Anxiety state   HTN (hypertension)   Hypokalemia   1. Acute on chronic respiratory failure. Related to pneumonia. She is currently on 5 L of oxygen. We'll try to wean down as tolerated. Her PCO2 is elevated, but her mental status is intact PTH is normal range. Continue to follow. 2. Sepsis. Related to pneumonia. Blood cultures have been sent she's been started on antibiotics. Lactic acid is in normal range. We'll continue IV hydration. 3. Community acquired pneumonia. Will continue on intravenous antibiotics. Follow-up urinary antigens and sputum culture. 4. COPD. No evidence of wheezing at this time. Continue bronchodilators and pulmonary hygiene. 5. Hypokalemia. Replace 6. Hypertension. Blood pressure is currently stable. Continue lisinopril. 7. Anxiety state. Continue the patient's Klonopin as needed for anxiety. 8. Chronic pain. Patient will be continued on OxyContin. 9. GERD. Continue PPI.  Code Status: full code DVT Prophylaxis: lovenox Family Communication: discussed with patient Disposition Plan: discharge  home once improved  Time spent: 60mins  [facility] LLCMEMON,JEHANZEB [facility] Pager [phone]

## 2015-10-15 NOTE — ED Provider Notes (Signed)
CSN: [id number]     Arrival date & time 10/15/15  0945 History   First MD Initiated Contact with Patient 10/15/15 [phone]     Chief Complaint  Patient presents with  . Cough     (Consider location/radiation/quality/duration/timing/severity/associated sxs/prior Treatment) Patient is a 49 y.o. female presenting with cough. The history is provided by the patient.  Cough Cough characteristics:  Productive Sputum characteristics:  Green Severity:  Severe Onset quality:  Gradual Duration:  4 days Timing:  Intermittent Progression:  Worsening Chronicity:  New Smoker: yes   Context: sick contacts, upper respiratory infection and weather changes   Relieved by:  Nothing Ineffective treatments:  Steroid inhaler Associated symptoms: chest pain, chills, fever, shortness of breath and wheezing     Past Medical History  Diagnosis Date  . Migraine headache   . On home O2   . COPD (chronic obstructive pulmonary disease) (HCC)   . Chronic back pain   . Hyperglycemia, drug-induced     steroid induced hyperglycemia  . ARDS (adult respiratory distress syndrome) [facility])     Jan 2011  . Diabetes mellitus   . Pneumonia   . Angina   . Asthma   . Shortness of breath   . Recurrent upper respiratory infection (URI)   . GERD (gastroesophageal reflux disease) 12/21/2012   Past Surgical History  Procedure Laterality Date  . Tubal ligation    . Uterine ablasion    . C-section    . Tracheostomy      decannulated 12/2009   Family History  Problem Relation Age of Onset  . Coronary artery disease Brother   . Diabetes Other   . Cancer Other   . Hypertension Other    Social History  Substance Use Topics  . Smoking status: Current Every Day Smoker -- 0.25 packs/day for 33 years    Types: Cigarettes  . Smokeless tobacco: None     Comment: 1/2 pack a day  since age 58  . Alcohol Use: No   OB History    No data available     Review of Systems  Constitutional: Positive for fever and chills.   Respiratory: Positive for cough, shortness of breath and wheezing.   Cardiovascular: Positive for chest pain.      Allergies  Penicillins; Levaquin; and Morphine  Home Medications   Prior to Admission medications   Medication Sig Start Date End Date Taking? Authorizing Provider  acetaminophen (TYLENOL) 500 MG tablet Take 500 mg by mouth every 6 (six) hours as needed for fever.     Historical Provider, MD  albuterol (PROAIR HFA) 108 (90 BASE) MCG/ACT inhaler Inhale 2 puffs into the lungs every 6 (six) hours as needed. For shortness of breATH    Historical Provider, MD  albuterol (PROVENTIL) (2.5 MG/3ML) 0.083% nebulizer solution Take 2.5 mg by nebulization every 4 (four) hours as needed. For shortness of breath  03/19/11   Abbi S Parrett, NP  ALPRAZolam (XANAX) 0.5 MG tablet Take 0.5 mg by mouth 3 (three) times daily as needed for anxiety.    Historical Provider, MD  budesonide-formoterol (SYMBICORT) 160-4.5 MCG/ACT inhaler Inhale 2 puffs into the lungs 2 (two) times daily.    Historical Provider, MD  clonazePAM (KLONOPIN) 0.5 MG tablet Take 0.5 mg by mouth 3 (three) times daily as needed for anxiety.    Historical Provider, MD  docusate sodium (COLACE) 100 MG capsule Take 200 mg by mouth every morning.    Historical Provider, MD  fluticasone (FLONASE) 50  MCG/ACT nasal spray Place 2 sprays into the nose daily.    Historical Provider, MD  lisinopril (PRINIVIL,ZESTRIL) 20 MG tablet Take 20 mg by mouth daily.    Historical Provider, MD  methocarbamol (ROBAXIN) 500 MG tablet Take 500 mg by mouth 2 (two) times daily.    Historical Provider, MD  OxyCODONE (OXYCONTIN) 10 mg T12A Take 1 tablet (10 mg total) by mouth every 12 (twelve) hours. 04/24/13   Kari Baars, MD  Oxymetazoline HCl (AFRIN NASAL SPRAY NA) Place 1 spray into the nose daily as needed (Congestion).     Historical Provider, MD  pregabalin (LYRICA) 50 MG capsule Take 50 mg by mouth every 8 (eight) hours.     Historical Provider, MD   promethazine (PHENERGAN) 25 MG tablet Take 25 mg by mouth every 6 (six) hours as needed for nausea.     Historical Provider, MD  sulfamethoxazole-trimethoprim (BACTRIM DS) 800-160 MG per tablet Take 1 tablet by mouth 2 (two) times daily.    Historical Provider, MD  SUMAtriptan (IMITREX) 100 MG tablet Take 100 mg by mouth every 2 (two) hours as needed for migraine or headache. May repeat in 2 hours if headache persists or recurs.    Historical Provider, MD  tiotropium (SPIRIVA) 18 MCG inhalation capsule Place 18 mcg into inhaler and inhale daily.    Historical Provider, MD   BP 127/76 mmHg  Pulse 129  Temp(Src) 101.5 F (38.6 C)  Resp 22  Ht 5' (1.524 m)  Wt 64.411 kg  BMI 27.73 kg/m2  SpO2 96% Physical Exam  ED Course  Procedures (including critical care time) CRITICAL CARE Performed by: Kathie Dike Total critical care time: **40* minutes Critical care time was exclusive of separately billable procedures and treating other patients. Critical care was necessary to treat or prevent imminent or life-threatening deterioration. Critical care was time spent personally by me on the following activities: development of treatment plan with patient and/or surrogate as well as nursing, discussions with consultants, evaluation of patient's response to treatment, examination of patient, obtaining history from patient or surrogate, ordering and performing treatments and interventions, ordering and review of laboratory studies, ordering and review of radiographic studies, pulse oximetry and re-evaluation of patient's condition.  Labs Review Labs Reviewed  CULTURE, BLOOD (ROUTINE X 2)  CULTURE, BLOOD (ROUTINE X 2)  CBC WITH DIFFERENTIAL/PLATELET  COMPREHENSIVE METABOLIC PANEL  TROPONIN I  LACTIC ACID, PLASMA  LACTIC ACID, PLASMA  BLOOD GAS, ARTERIAL    Imaging Review No results found. I have personally reviewed and evaluated these images and lab results as part of my medical  decision-making.   EKG Interpretation None      MDM  Temperature 101.5, pulse rate elevated at 129. Pulse oximetry 94-96% on 3 L of oxygen.  Patient has a productive cough with green phlegm. She has bilateral wheezing present. She is tachycardic with temperature 101.5. Patient will be treated as a pneumonia-related sepsis. IV fluids started.  Chest x-ray reveals bilateral pneumonia. IV vancomycin and Elita Quick has been ordered. 11:02 AM Patient complains of pain in her chest. Oral Norco has been ordered. Heart rate remains elevated at 127. Patient is being set up for a continuous albuterol nebulizer treatment. Arterial blood gas reveals a pH of 7.35, CO2 of 63.3, PO2 64, bicarbonate of 30.9, and O2 saturation of 92%.  Lactic Acid 1.4, wnl. WBC 31.3 Trop 0.03. EKG negative for acute event. Temp and heart rate improving. Pt breathing easier, but continues to have congestion and pulse ox  from 90 to 94%. Case discussed with hospitalist. Pt to be admitted to Dr Juanetta GoslingHawkins service step down ICU.   Final diagnoses:  Sepsis, due to unspecified organism [facility](HCC)  Community acquired pneumonia    **I have reviewed nursing notes, vital signs, and all appropriate lab and imaging results for this patient.Ivery Quale*    Tameem Pullara, PA-C 10/19/15 1833  Glynn OctaveStephen Rancour, MD 10/19/15 [phone]

## 2015-10-15 NOTE — Progress Notes (Signed)
ANTIBIOTIC CONSULT NOTE - INITIAL  Pharmacy Consult for renal adjust antibiotics Indication: pneumonia,sepsis  Allergies  Allergen Reactions  . Penicillins Other (See Comments)    convulsions  . Levaquin [Levofloxacin In D5w] Itching and Swelling  . Morphine Nausea Only    Patient Measurements: Height: 5' (152.4 cm) Weight: 137 lb 12.6 oz (62.5 kg) IBW/kg (Calculated) : 45.5   Vital Signs: Temp: 97 F (36.1 C) (11/22 1512) Temp Source: Oral (11/22 1512) BP: 109/66 mmHg (11/22 1500) Pulse Rate: 103 (11/22 1500) Intake/Output from previous day:   Intake/Output from this shift:    Labs:  Recent Labs  10/15/15 1017  WBC 31.3*  HGB 13.6  PLT 516*  CREATININE 0.48   Estimated Creatinine Clearance: 71.8 mL/min (by C-G formula based on Cr of 0.48). No results for input(s): VANCOTROUGH, VANCOPEAK, VANCORANDOM, GENTTROUGH, GENTPEAK, GENTRANDOM, TOBRATROUGH, TOBRAPEAK, TOBRARND, AMIKACINPEAK, AMIKACINTROU, AMIKACIN in the last 72 hours.   Microbiology: No results found for this or any previous visit (from the past 720 hour(s)).  Medical History: Past Medical History  Diagnosis Date  . Migraine headache   . On home O2   . COPD (chronic obstructive pulmonary disease) (HCC)   . Chronic back pain   . Hyperglycemia, drug-induced     steroid induced hyperglycemia  . ARDS (adult respiratory distress syndrome) [facility])     Jan 2011  . Diabetes mellitus   . Pneumonia   . Angina   . Asthma   . Shortness of breath   . Recurrent upper respiratory infection (URI)   . GERD (gastroesophageal reflux disease) 12/21/2012  . Anxiety state 10/15/2015  . HTN (hypertension) 10/15/2015    Medications:  Prescriptions prior to admission  Medication Sig Dispense Refill Last Dose  . acetaminophen (TYLENOL) 500 MG tablet Take 500 mg by mouth every 6 (six) hours as needed for fever.    unknown  . albuterol (PROAIR HFA) 108 (90 BASE) MCG/ACT inhaler Inhale 2 puffs into the lungs every 6  (six) hours as needed. For shortness of breATH   10/14/2015 at Unknown time  . albuterol (PROVENTIL) (2.5 MG/3ML) 0.083% nebulizer solution Take 2.5 mg by nebulization every 4 (four) hours as needed. For shortness of breath    10/14/2015 at Unknown time  . budesonide-formoterol (SYMBICORT) 160-4.5 MCG/ACT inhaler Inhale 2 puffs into the lungs 2 (two) times daily.   10/14/2015 at Unknown time  . clonazePAM (KLONOPIN) 0.5 MG tablet Take 0.5 mg by mouth 3 (three) times daily as needed for anxiety.   10/14/2015 at Unknown time  . lisinopril (PRINIVIL,ZESTRIL) 20 MG tablet Take 20 mg by mouth daily.   10/14/2015 at Unknown time  . OxyCODONE (OXYCONTIN) 10 mg T12A Take 1 tablet (10 mg total) by mouth every 12 (twelve) hours. 60 tablet 0 10/15/2015 at Unknown time  . Oxymetazoline HCl (AFRIN NASAL SPRAY NA) Place 1 spray into the nose daily as needed (Congestion).    10/14/2015 at Unknown time  . pantoprazole (PROTONIX) 40 MG tablet Take 40 mg by mouth daily.   10/14/2015 at Unknown time  . pregabalin (LYRICA) 50 MG capsule Take 50 mg by mouth every 8 (eight) hours.    10/14/2015 at Unknown time  . promethazine (PHENERGAN) 25 MG tablet Take 25 mg by mouth every 6 (six) hours as needed for nausea.    unknown  . RESTASIS 0.05 % ophthalmic emulsion Place 1 drop into both eyes 2 (two) times daily.  3 10/14/2015 at Unknown time  . tiotropium (SPIRIVA) 18 MCG  inhalation capsule Place 18 mcg into inhaler and inhale daily.   10/14/2015 at Unknown time   Assessment: 49 yo lady to start empiric antibiotics for CAP, r/o sepsis.  She is currently on azithromycin and rocephin, neither require adjustment of dose for renal function  Goal of Therapy:  Eradication of infection  Plan:  Cont rocephin and azithromycin as ordered F/u clinical course and cultures.  Talbert CageSeay, Eartha Vonbehren Poteet 10/15/2015,4:16 PM

## 2015-10-15 NOTE — Progress Notes (Signed)
Inpatient Diabetes Program Recommendations  AACE/ADA: New Consensus Statement on Inpatient Glycemic Control (2015)  Target Ranges:  Prepandial:   less than 140 mg/dL      Peak postprandial:   less than 180 mg/dL (1-2 hours)      Critically ill patients:  140 - 180 mg/dL  Results for [name], Joyce A (MRN [id number]) as of 10/15/2015 14:59  Ref. Range 10/15/2015 10:17  Glucose Latest Ref Range: 65-99 mg/dL 782225 (H)   Review of Glycemic Control  Diabetes history: DM2 Outpatient Diabetes medications: None Current orders for Inpatient glycemic control: None  Inpatient Diabetes Program Recommendations: Correction (SSI): Please consider ordering CBGs with Novolog correction scale ACHS. HgbA1C: Please consider ordering an A1C to evaluate glycemic control over the past 2-3 months.   Thanks, Orlando PennerMarie Kloee Ballew, RN, MSN, CDE Diabetes Coordinator Inpatient Diabetes Program [phone] (Team Pager from 8am to 5pm) [phone] ([facility] office) [phone] [facility] office) [phone] [facility] office)

## 2015-10-15 NOTE — ED Notes (Signed)
Pt c/o head cold x 11 days worsening x 4 days. Productive cough with thick green sputum and cp today.

## 2015-10-16 DIAGNOSIS — R6510 Systemic inflammatory response syndrome (SIRS) of non-infectious origin without acute organ dysfunction: Secondary | ICD-10-CM | POA: Diagnosis present

## 2015-10-16 LAB — LEGIONELLA PNEUMOPHILA SEROGP 1 UR AG: L. pneumophila Serogp 1 Ur Ag: NEGATIVE

## 2015-10-16 LAB — GLUCOSE, CAPILLARY
GLUCOSE-CAPILLARY: 309 mg/dL — AB (ref 65–99)
Glucose-Capillary: 215 mg/dL — ABNORMAL HIGH (ref 65–99)
Glucose-Capillary: 240 mg/dL — ABNORMAL HIGH (ref 65–99)

## 2015-10-16 LAB — HIV ANTIBODY (ROUTINE TESTING W REFLEX): HIV Screen 4th Generation wRfx: NONREACTIVE

## 2015-10-16 MED ORDER — METHYLPREDNISOLONE SODIUM SUCC 40 MG IJ SOLR
40.0000 mg | Freq: Four times a day (QID) | INTRAMUSCULAR | Status: DC
  Administered 2015-10-16 – 2015-10-19 (×12): 40 mg via INTRAVENOUS
  Filled 2015-10-16 (×12): qty 1

## 2015-10-16 MED ORDER — INSULIN ASPART 100 UNIT/ML ~~LOC~~ SOLN
0.0000 [IU] | Freq: Three times a day (TID) | SUBCUTANEOUS | Status: DC
  Administered 2015-10-16: 15 [IU] via SUBCUTANEOUS
  Administered 2015-10-16 – 2015-10-17 (×3): 7 [IU] via SUBCUTANEOUS
  Administered 2015-10-17: 11 [IU] via SUBCUTANEOUS
  Administered 2015-10-18: 7 [IU] via SUBCUTANEOUS
  Administered 2015-10-18 (×2): 11 [IU] via SUBCUTANEOUS

## 2015-10-16 NOTE — Care Management Note (Signed)
Case Management Note  Patient Details  Name: [name] MRN: [id number] Date of Birth: 08/22/1966  Subjective/Objective:                  Pt is from home, lives with her husband and extended family. Pt has home O2 and neb machine. Pt has no other DME or HH services prior to admission.   Action/Plan: Pt plans to return home with self care at DC. No CM needs anticipated.   Expected Discharge Date:     10/17/2015             Expected Discharge Plan:  Home/Self Care  In-House Referral:  NA  Discharge planning Services  CM Consult  Post Acute Care Choice:  NA Choice offered to:  NA  DME Arranged:    DME Agency:     HH Arranged:    HH Agency:     Status of Service:  Completed, signed off  Medicare Important Message Given:    Date Medicare IM Given:    Medicare IM give by:    Date Additional Medicare IM Given:    Additional Medicare Important Message give by:     If discussed at Long Length of Stay Meetings, dates discussed:    Additional Comments:  Malcolm MetroChildress, Adean Milosevic Demske, RN 10/16/2015, 2:42 PM

## 2015-10-16 NOTE — Progress Notes (Signed)
Subjective: She was admitted with bilateral pneumonia and COPD exacerbation and systemic inflammatory response syndrome. Her lactate level has been okay. Her pro calcitonin is equivocal she's had some somewhat lower blood pressure including one in the 90s at about 2 hours ago. She is still coughing and still congested. She is coughing up sputum. Her blood sugar has been elevated likely related to her acute illness.  Objective: Vital signs in last 24 hours: Temp:  [97 F (36.1 C)-101.5 F (38.6 C)] 99.6 F (37.6 C) (11/23 0400) Pulse Rate:  [46-129] 100 (11/23 0600) Resp:  [11-35] 27 (11/23 0600) BP: (97-131)/(58-109) 113/72 mmHg (11/23 0600) SpO2:  [90 %-99 %] 95 % (11/23 0729) FiO2 (%):  [36 %] 36 % (11/22 2000) Weight:  [62.5 kg (137 lb 12.6 oz)-65.8 kg (145 lb 1 oz)] 65.8 kg (145 lb 1 oz) (11/23 0500) Weight change:     Intake/Output from previous day: 11/22 0701 - 11/23 0700 In: 2185 [I.V.:885; IV Piggyback:1300] Out: 2300 [Urine:2300]  PHYSICAL EXAM General appearance: alert, cooperative and moderate distress Resp: rales bilaterally, rhonchi bilaterally and wheezes bilaterally Cardio: regular rate and rhythm, S1, S2 normal, no murmur, click, rub or gallop and With tachycardia at about 115 GI: soft, non-tender; bowel sounds normal; no masses,  no organomegaly Extremities: extremities normal, atraumatic, no cyanosis or edema  Lab Results:  Results for orders placed or performed during the hospital encounter of 10/15/15 (from the past 48 hour(s))  CBC with Differential     Status: Abnormal   Collection Time: 10/15/15 10:17 AM  Result Value Ref Range   WBC 31.3 (H) 4.0 - 10.5 K/uL   RBC 4.70 3.87 - 5.11 MIL/uL   Hemoglobin 13.6 12.0 - 15.0 g/dL   HCT 41.4 36.0 - 46.0 %   MCV 88.1 78.0 - 100.0 fL   MCH 28.9 26.0 - 34.0 pg   MCHC 32.9 30.0 - 36.0 g/dL   RDW 12.4 11.5 - 15.5 %   Platelets 516 (H) 150 - 400 K/uL   Neutrophils Relative % 79 %   Neutro Abs 24.9 (H) 1.7 - 7.7  K/uL   Lymphocytes Relative 11 %   Lymphs Abs 3.4 0.7 - 4.0 K/uL   Monocytes Relative 10 %   Monocytes Absolute 3.0 (H) 0.1 - 1.0 K/uL   Eosinophils Relative 0 %   Eosinophils Absolute 0.0 0.0 - 0.7 K/uL   Basophils Relative 0 %   Basophils Absolute 0.0 0.0 - 0.1 K/uL  Comprehensive metabolic panel     Status: Abnormal   Collection Time: 10/15/15 10:17 AM  Result Value Ref Range   Sodium 135 135 - 145 mmol/L   Potassium 3.2 (L) 3.5 - 5.1 mmol/L   Chloride 89 (L) 101 - 111 mmol/L   CO2 32 22 - 32 mmol/L   Glucose, Bld 225 (H) 65 - 99 mg/dL   BUN 8 6 - 20 mg/dL   Creatinine, Ser 0.48 0.44 - 1.00 mg/dL   Calcium 9.1 8.9 - 10.3 mg/dL   Total Protein 8.4 (H) 6.5 - 8.1 g/dL   Albumin 3.5 3.5 - 5.0 g/dL   AST 15 15 - 41 U/L   ALT 10 (L) 14 - 54 U/L   Alkaline Phosphatase 108 38 - 126 U/L   Total Bilirubin 0.4 0.3 - 1.2 mg/dL   GFR calc non Af Amer >60 >60 mL/min   GFR calc Af Amer >60 >60 mL/min    Comment: (NOTE) The eGFR has been calculated using the CKD  EPI equation. This calculation has not been validated in all clinical situations. eGFR's persistently <60 mL/min signify possible Chronic Kidney Disease.    Anion gap 14 5 - 15  Troponin I     Status: None   Collection Time: 10/15/15 10:17 AM  Result Value Ref Range   Troponin I <0.03 <0.031 ng/mL    Comment:        NO INDICATION OF MYOCARDIAL INJURY.   Lactic acid, plasma     Status: None   Collection Time: 10/15/15 10:18 AM  Result Value Ref Range   Lactic Acid, Venous 1.4 0.5 - 2.0 mmol/L  Blood gas, arterial ([facility] & [facility] ONLY)     Status: Abnormal   Collection Time: 10/15/15 10:40 AM  Result Value Ref Range   O2 Content 5.0 L/min   Delivery systems NASAL CANNULA    pH, Arterial 7.352 7.350 - 7.450   pCO2 arterial 63.3 (HH) 35.0 - 45.0 mmHg    Comment: CRITICAL RESULT CALLED TO, READ BACK BY AND VERIFIED WITH: TIFFANY VOGLAR, RN BY JESSICA LASLEY, RRT ON 10/15/15 AT 1046    pO2, Arterial 64.2 (L) 80.0 - 100.0 mmHg    Bicarbonate 30.9 (H) 20.0 - 24.0 mEq/L   Acid-Base Excess 8.6 (H) 0.0 - 2.0 mmol/L   O2 Saturation 92.4 %   Collection site RIGHT RADIAL    Drawn by COLLECTED BY RT    Sample type ARTERIAL    Allens test (pass/fail) PASS PASS  Lactic acid, plasma     Status: None   Collection Time: 10/15/15  1:20 PM  Result Value Ref Range   Lactic Acid, Venous 1.3 0.5 - 2.0 mmol/L  MRSA PCR Screening     Status: None   Collection Time: 10/15/15  2:00 PM  Result Value Ref Range   MRSA by PCR NEGATIVE NEGATIVE    Comment:        The GeneXpert MRSA Assay (FDA approved for NASAL specimens only), is one component of a comprehensive MRSA colonization surveillance program. It is not intended to diagnose MRSA infection nor to guide or monitor treatment for MRSA infections.   Influenza panel by PCR (type A & B, H1N1)     Status: None   Collection Time: 10/15/15  4:18 PM  Result Value Ref Range   Influenza A By PCR NEGATIVE NEGATIVE   Influenza B By PCR NEGATIVE NEGATIVE   H1N1 flu by pcr NOT DETECTED NOT DETECTED    Comment:        The Xpert Flu assay (FDA approved for nasal aspirates or washes and nasopharyngeal swab specimens), is intended as an aid in the diagnosis of influenza and should not be used as a sole basis for treatment.   HIV antibody     Status: None   Collection Time: 10/15/15  4:28 PM  Result Value Ref Range   HIV Screen 4th Generation wRfx Non Reactive Non Reactive    Comment: (NOTE) Performed At: [facility] [address] [place], [place] [id number] Lindon Romp MD EG:[id number]   Lactic acid, plasma     Status: None   Collection Time: 10/15/15  4:28 PM  Result Value Ref Range   Lactic Acid, Venous 1.1 0.5 - 2.0 mmol/L  Procalcitonin     Status: None   Collection Time: 10/15/15  4:28 PM  Result Value Ref Range   Procalcitonin 4.67 ng/mL    Comment:        Interpretation: PCT > 2 ng/mL: Systemic  infection (sepsis) is likely, unless other causes  are known. (NOTE)         ICU PCT Algorithm               Non ICU PCT Algorithm    ----------------------------     ------------------------------         PCT < 0.25 ng/mL                 PCT < 0.1 ng/mL     Stopping of antibiotics            Stopping of antibiotics       strongly encouraged.               strongly encouraged.    ----------------------------     ------------------------------       PCT level decrease by               PCT < 0.25 ng/mL       >= 80% from peak PCT       OR PCT 0.25 - 0.5 ng/mL          Stopping of antibiotics                                             encouraged.     Stopping of antibiotics           encouraged.    ----------------------------     ------------------------------       PCT level decrease by              PCT >= 0.25 ng/mL       < 80% from peak PCT        AND PCT >= 0.5 ng/mL            Continuing antibiotics                                               encouraged.       Continuing antibiotics            encouraged.    ----------------------------     ------------------------------     PCT level increase compared          PCT > 0.5 ng/mL         with peak PCT AND          PCT >= 0.5 ng/mL             Escalation of antibiotics                                          strongly encouraged.      Escalation of antibiotics        strongly encouraged.   Glucose, capillary     Status: Abnormal   Collection Time: 10/15/15  4:29 PM  Result Value Ref Range   Glucose-Capillary 139 (H) 65 - 99 mg/dL   Comment 1 Notify RN    Comment 2 Document in Chart   Strep pneumoniae urinary antigen  (not at [facility])     Status: None   Collection Time: 10/15/15  6:31 PM  Result Value Ref Range   Strep Pneumo Urinary Antigen NEGATIVE NEGATIVE    Comment:        Infection due to S. pneumoniae cannot be absolutely ruled out since the antigen present may be below the detection limit of the test. Performed at [facility]   Lactic acid, plasma      Status: None   Collection Time: 10/15/15  7:43 PM  Result Value Ref Range   Lactic Acid, Venous 0.5 0.5 - 2.0 mmol/L  Glucose, capillary     Status: Abnormal   Collection Time: 10/15/15 10:12 PM  Result Value Ref Range   Glucose-Capillary 262 (H) 65 - 99 mg/dL   Comment 1 Notify RN     ABGS  Recent Labs  10/15/15 1040  PHART 7.352  PO2ART 64.2*  HCO3 30.9*   CULTURES Recent Results (from the past 240 hour(s))  MRSA PCR Screening     Status: None   Collection Time: 10/15/15  2:00 PM  Result Value Ref Range Status   MRSA by PCR NEGATIVE NEGATIVE Final    Comment:        The GeneXpert MRSA Assay (FDA approved for NASAL specimens only), is one component of a comprehensive MRSA colonization surveillance program. It is not intended to diagnose MRSA infection nor to guide or monitor treatment for MRSA infections.    Studies/Results: Dg Chest Port 1 View  10/15/2015  CLINICAL DATA:  Cough and congestion EXAM: PORTABLE CHEST - 1 VIEW COMPARISON:  01/29/2015 FINDINGS: Cardiac shadow is within normal limits. The lungs are well aerated bilaterally. Patchy infiltrative changes are noted in the bases bilaterally as well as within the right upper lobe. No sizable effusion is seen. No bony abnormality is noted. IMPRESSION: Patchy infiltrates bilaterally.  Continued followup is recommended. Electronically Signed   By: Inez Catalina M.D.   On: 10/15/2015 10:35    Medications:  Prior to Admission:  Prescriptions prior to admission  Medication Sig Dispense Refill Last Dose  . acetaminophen (TYLENOL) 500 MG tablet Take 500 mg by mouth every 6 (six) hours as needed for fever.    unknown  . albuterol (PROAIR HFA) 108 (90 BASE) MCG/ACT inhaler Inhale 2 puffs into the lungs every 6 (six) hours as needed. For shortness of breATH   10/14/2015 at Unknown time  . albuterol (PROVENTIL) (2.5 MG/3ML) 0.083% nebulizer solution Take 2.5 mg by nebulization every 4 (four) hours as needed. For shortness  of breath    10/14/2015 at Unknown time  . budesonide-formoterol (SYMBICORT) 160-4.5 MCG/ACT inhaler Inhale 2 puffs into the lungs 2 (two) times daily.   10/14/2015 at Unknown time  . clonazePAM (KLONOPIN) 0.5 MG tablet Take 0.5 mg by mouth 3 (three) times daily as needed for anxiety.   10/14/2015 at Unknown time  . lisinopril (PRINIVIL,ZESTRIL) 20 MG tablet Take 20 mg by mouth daily.   10/14/2015 at Unknown time  . OxyCODONE (OXYCONTIN) 10 mg T12A Take 1 tablet (10 mg total) by mouth every 12 (twelve) hours. 60 tablet 0 10/15/2015 at Unknown time  . Oxymetazoline HCl (AFRIN NASAL SPRAY NA) Place 1 spray into the nose daily as needed (Congestion).    10/14/2015 at Unknown time  . pantoprazole (PROTONIX) 40 MG tablet Take 40 mg by mouth daily.   10/14/2015 at Unknown time  . pregabalin (LYRICA) 50 MG capsule Take 50 mg by mouth every 8 (eight) hours.    10/14/2015 at Unknown time  . promethazine (PHENERGAN) 25 MG tablet Take 25  mg by mouth every 6 (six) hours as needed for nausea.    unknown  . RESTASIS 0.05 % ophthalmic emulsion Place 1 drop into both eyes 2 (two) times daily.  3 10/14/2015 at Unknown time  . tiotropium (SPIRIVA) 18 MCG inhalation capsule Place 18 mcg into inhaler and inhale daily.   10/14/2015 at Unknown time   Scheduled: . azithromycin  500 mg Intravenous Q24H  . budesonide-formoterol  2 puff Inhalation BID  . cefTRIAXone (ROCEPHIN)  IV  1 g Intravenous Q24H  . cycloSPORINE  1 drop Both Eyes BID  . enoxaparin (LOVENOX) injection  40 mg Subcutaneous Q24H  . guaiFENesin  1,200 mg Oral BID  . ipratropium-albuterol  3 mL Nebulization Q6H  . lisinopril  20 mg Oral Daily  . methylPREDNISolone (SOLU-MEDROL) injection  40 mg Intravenous Q6H  . oxyCODONE  10 mg Oral Q12H  . pantoprazole  40 mg Oral Daily  . pregabalin  50 mg Oral 3 times per day   Continuous: . sodium chloride 75 mL/hr at 10/16/15 0500   MEB:[id number], albuterol, clonazePAM, HYDROcodone-acetaminophen,  ondansetron (ZOFRAN) IV  Assesment: She was admitted with community-acquired pneumonia, COPD exacerbation and sepsis/systemic inflammatory response syndrome. She is still somewhat hypotensive.  She has acute on chronic hypoxic respiratory failure and is on about 4 L of oxygen now.  She has chronic back pain at baseline and her medications are being adjusted considering the severity of her illness  She has diabetes probably related to being sick steroid use etc.  She has had trouble with significant anxiety and depression.  She previously had an episode of ARDS requiring intubation mechanical ventilation which was fairly long-term and she eventually had to have tracheostomy placed Principal Problem:   Sepsis ([place]) Active Problems:   TOBACCO ABUSE   COPD (chronic obstructive pulmonary disease) (HCC)   GERD (gastroesophageal reflux disease)   CAP (community acquired pneumonia)   Community acquired pneumonia   Acute-on-chronic respiratory failure ([place])   Anxiety state   HTN (hypertension)   Hypokalemia    Plan: She remains critically ill with high possibility of deteriorating and requiring intubation and mechanical ventilation. I will go ahead and increase her IV fluids. Continue with all of her other treatments including antibiotics and steroids. Treat her diabetes.    LOS: 1 day   TRUE Garciamartinez L 10/16/2015, 8:06 AM

## 2015-10-16 NOTE — Progress Notes (Signed)
Inpatient Diabetes Program Recommendations  AACE/ADA: New Consensus Statement on Inpatient Glycemic Control (2015)  Target Ranges:  Prepandial:   less than 140 mg/dL      Peak postprandial:   less than 180 mg/dL (1-2 hours)      Critically ill patients:  140 - 180 mg/dL  Results for Scribner, Nakaila A (MRN [id number]) as of 10/16/2015 07:53  Ref. Range 10/15/2015 16:29 10/15/2015 22:12  Glucose-Capillary Latest Ref Range: 65-99 mg/dL 725139 (H) 366262 (H)  Results for Grossberg, Ivianna A (MRN [id number]) as of 10/16/2015 07:53  Ref. Range 10/15/2015 10:17  Glucose Latest Ref Range: 65-99 mg/dL 956225 (H)    Review of Glycemic Control  Diabetes history: DM2 Outpatient Diabetes medications: None Current orders for Inpatient glycemic control: None  Inpatient Diabetes Program Recommendations: Correction (SSI): Please consider ordering CBGs with Novolog correction scale ACHS. HgbA1C: Please consider ordering an A1C to evaluate glycemic control over the past 2-3 months.   Thanks, Orlando PennerMarie Jester Klingberg, RN, MSN, CDE Diabetes Coordinator Inpatient Diabetes Program [phone] (Team Pager from 8am to 5pm) [phone] ([facility] office) [phone] [facility] office) [phone] [facility] office)

## 2015-10-16 NOTE — Progress Notes (Signed)
Pt was experiencing anxiety and stress due to a situation with family ( from incoming phone calls (son?)) Pt had periods of slight elevation in heart rate and respirations during those times. Situation may be ongoing.

## 2015-10-17 LAB — GLUCOSE, CAPILLARY
GLUCOSE-CAPILLARY: 240 mg/dL — AB (ref 65–99)
GLUCOSE-CAPILLARY: 247 mg/dL — AB (ref 65–99)
GLUCOSE-CAPILLARY: 255 mg/dL — AB (ref 65–99)
GLUCOSE-CAPILLARY: 312 mg/dL — AB (ref 65–99)
Glucose-Capillary: 273 mg/dL — ABNORMAL HIGH (ref 65–99)
Glucose-Capillary: 229 mg/dL — ABNORMAL HIGH (ref 65–99)

## 2015-10-17 LAB — HEMOGLOBIN A1C
Hgb A1c MFr Bld: 7.8 % — ABNORMAL HIGH (ref 4.8–5.6)
Mean Plasma Glucose: 177 mg/dL

## 2015-10-17 MED ORDER — PREGABALIN 75 MG PO CAPS
100.0000 mg | ORAL_CAPSULE | Freq: Three times a day (TID) | ORAL | Status: DC
  Administered 2015-10-17 – 2015-10-19 (×6): 100 mg via ORAL
  Filled 2015-10-17 (×7): qty 1

## 2015-10-17 MED ORDER — OXYCODONE HCL 5 MG PO TABS
5.0000 mg | ORAL_TABLET | ORAL | Status: DC | PRN
  Administered 2015-10-17 – 2015-10-19 (×10): 5 mg via ORAL
  Filled 2015-10-17 (×10): qty 1

## 2015-10-17 MED ORDER — SUMATRIPTAN SUCCINATE 50 MG PO TABS
50.0000 mg | ORAL_TABLET | ORAL | Status: DC | PRN
  Administered 2015-10-17 – 2015-10-18 (×4): 50 mg via ORAL
  Filled 2015-10-17 (×5): qty 1

## 2015-10-17 NOTE — Progress Notes (Signed)
Subjective: She says she's feels better but is still feeling pretty tight in her chest. She is still coughing up a lot of sputum. Her pain is not adequately controlled at this time  Objective: Vital signs in last 24 hours: Temp:  [97 F (36.1 C)-98.6 F (37 C)] 98.5 F (36.9 C) (11/24 1126) Pulse Rate:  [26-108] 104 (11/24 0832) Resp:  [14-32] 21 (11/24 0600) BP: (119-149)/(74-111) 143/77 mmHg (11/24 0800) SpO2:  [92 %-99 %] 95 % (11/24 0832) Weight:  [64.6 kg (142 lb 6.7 oz)] 64.6 kg (142 lb 6.7 oz) (11/24 0500) Weight change: 0.189 kg (6.7 oz) Last BM Date: 10/13/15  Intake/Output from previous day: 11/23 0701 - 11/24 0700 In: 3439.2 [P.O.:1320; I.V.:1819.2; IV Piggyback:300] Out: 875 [Urine:875]  PHYSICAL EXAM General appearance: alert, cooperative and moderate distress Resp: Decreased breath sounds. She is still not moving air well Cardio: Her heart is regular but she is still tachycardic at rest GI: soft, non-tender; bowel sounds normal; no masses,  no organomegaly Extremities: extremities normal, atraumatic, no cyanosis or edema  Lab Results:  Results for orders placed or performed during the hospital encounter of 10/15/15 (from the past 48 hour(s))  Lactic acid, plasma     Status: None   Collection Time: 10/15/15  1:20 PM  Result Value Ref Range   Lactic Acid, Venous 1.3 0.5 - 2.0 mmol/L  MRSA PCR Screening     Status: None   Collection Time: 10/15/15  2:00 PM  Result Value Ref Range   MRSA by PCR NEGATIVE NEGATIVE    Comment:        The GeneXpert MRSA Assay (FDA approved for NASAL specimens only), is one component of a comprehensive MRSA colonization surveillance program. It is not intended to diagnose MRSA infection nor to guide or monitor treatment for MRSA infections.   Influenza panel by PCR (type A & B, H1N1)     Status: None   Collection Time: 10/15/15  4:18 PM  Result Value Ref Range   Influenza A By PCR NEGATIVE NEGATIVE   Influenza B By PCR  NEGATIVE NEGATIVE   H1N1 flu by pcr NOT DETECTED NOT DETECTED    Comment:        The Xpert Flu assay (FDA approved for nasal aspirates or washes and nasopharyngeal swab specimens), is intended as an aid in the diagnosis of influenza and should not be used as a sole basis for treatment.   HIV antibody     Status: None   Collection Time: 10/15/15  4:28 PM  Result Value Ref Range   HIV Screen 4th Generation wRfx Non Reactive Non Reactive    Comment: (NOTE) Performed At: [facility] [address] [place], [place] [id number] Mila Homer MD VQ:[id number]   Lactic acid, plasma     Status: None   Collection Time: 10/15/15  4:28 PM  Result Value Ref Range   Lactic Acid, Venous 1.1 0.5 - 2.0 mmol/L  Procalcitonin     Status: None   Collection Time: 10/15/15  4:28 PM  Result Value Ref Range   Procalcitonin 4.67 ng/mL    Comment:        Interpretation: PCT > 2 ng/mL: Systemic infection (sepsis) is likely, unless other causes are known. (NOTE)         ICU PCT Algorithm               Non ICU PCT Algorithm    ----------------------------     ------------------------------  PCT < 0.25 ng/mL                 PCT < 0.1 ng/mL     Stopping of antibiotics            Stopping of antibiotics       strongly encouraged.               strongly encouraged.    ----------------------------     ------------------------------       PCT level decrease by               PCT < 0.25 ng/mL       >= 80% from peak PCT       OR PCT 0.25 - 0.5 ng/mL          Stopping of antibiotics                                             encouraged.     Stopping of antibiotics           encouraged.    ----------------------------     ------------------------------       PCT level decrease by              PCT >= 0.25 ng/mL       < 80% from peak PCT        AND PCT >= 0.5 ng/mL            Continuing antibiotics                                               encouraged.       Continuing antibiotics             encouraged.    ----------------------------     ------------------------------     PCT level increase compared          PCT > 0.5 ng/mL         with peak PCT AND          PCT >= 0.5 ng/mL             Escalation of antibiotics                                          strongly encouraged.      Escalation of antibiotics        strongly encouraged.   Glucose, capillary     Status: Abnormal   Collection Time: 10/15/15  4:29 PM  Result Value Ref Range   Glucose-Capillary 139 (H) 65 - 99 mg/dL   Comment 1 Notify RN    Comment 2 Document in Chart   Legionella Pneumophila Serogp 1 Ur Ag     Status: None   Collection Time: 10/15/15  6:31 PM  Result Value Ref Range   L. pneumophila Serogp 1 Ur Ag Negative Negative    Comment: (NOTE) Performed At: [facility] [address] [place], [place] [id number] Mila HomerHancock William F MD WU:[id number]:[phone]    Source of Sample RANDOM   Strep pneumoniae urinary antigen  (not at [facility])     Status:  None   Collection Time: 10/15/15  6:31 PM  Result Value Ref Range   Strep Pneumo Urinary Antigen NEGATIVE NEGATIVE    Comment:        Infection due to S. pneumoniae cannot be absolutely ruled out since the antigen present may be below the detection limit of the test. Performed at [facility]   Lactic acid, plasma     Status: None   Collection Time: 10/15/15  7:43 PM  Result Value Ref Range   Lactic Acid, Venous 0.5 0.5 - 2.0 mmol/L  Glucose, capillary     Status: Abnormal   Collection Time: 10/15/15 10:12 PM  Result Value Ref Range   Glucose-Capillary 262 (H) 65 - 99 mg/dL   Comment 1 Notify RN   Glucose, capillary     Status: Abnormal   Collection Time: 10/16/15 11:27 AM  Result Value Ref Range   Glucose-Capillary 215 (H) 65 - 99 mg/dL  Glucose, capillary     Status: Abnormal   Collection Time: 10/16/15  4:01 PM  Result Value Ref Range   Glucose-Capillary 309 (H) 65 - 99 mg/dL   Comment 1 Notify RN    Comment 2 Document in Chart    Glucose, capillary     Status: Abnormal   Collection Time: 10/16/15  7:55 PM  Result Value Ref Range   Glucose-Capillary 240 (H) 65 - 99 mg/dL  Glucose, capillary     Status: Abnormal   Collection Time: 10/16/15 11:59 PM  Result Value Ref Range   Glucose-Capillary 312 (H) 65 - 99 mg/dL  Glucose, capillary     Status: Abnormal   Collection Time: 10/17/15  3:56 AM  Result Value Ref Range   Glucose-Capillary 247 (H) 65 - 99 mg/dL  Glucose, capillary     Status: Abnormal   Collection Time: 10/17/15  7:12 AM  Result Value Ref Range   Glucose-Capillary 240 (H) 65 - 99 mg/dL   Comment 1 Notify RN    Comment 2 Document in Chart   Glucose, capillary     Status: Abnormal   Collection Time: 10/17/15 11:07 AM  Result Value Ref Range   Glucose-Capillary 255 (H) 65 - 99 mg/dL   Comment 1 Notify RN    Comment 2 Document in Chart     ABGS  Recent Labs  10/15/15 1040  PHART 7.352  PO2ART 64.2*  HCO3 30.9*   CULTURES Recent Results (from the past 240 hour(s))  Blood culture (routine x 2)     Status: None (Preliminary result)   Collection Time: 10/15/15 10:14 AM  Result Value Ref Range Status   Specimen Description BLOOD RIGHT ANTECUBITAL  Final   Special Requests BOTTLES DRAWN AEROBIC AND ANAEROBIC 10CC EACH  Final   Culture NO GROWTH 1 DAY  Final   Report Status PENDING  Incomplete  Blood culture (routine x 2)     Status: None (Preliminary result)   Collection Time: 10/15/15 10:19 AM  Result Value Ref Range Status   Specimen Description BLOOD LEFT ARM DRAWN BY RN  Final   Special Requests BOTTLES DRAWN AEROBIC AND ANAEROBIC 5CC EACH  Final   Culture NO GROWTH 1 DAY  Final   Report Status PENDING  Incomplete  MRSA PCR Screening     Status: None   Collection Time: 10/15/15  2:00 PM  Result Value Ref Range Status   MRSA by PCR NEGATIVE NEGATIVE Final    Comment:        The GeneXpert MRSA  Assay (FDA approved for NASAL specimens only), is one component of a comprehensive MRSA  colonization surveillance program. It is not intended to diagnose MRSA infection nor to guide or monitor treatment for MRSA infections.    Studies/Results: No results found.  Medications:  Prior to Admission:  Prescriptions prior to admission  Medication Sig Dispense Refill Last Dose  . acetaminophen (TYLENOL) 500 MG tablet Take 500 mg by mouth every 6 (six) hours as needed for fever.    unknown  . albuterol (PROAIR HFA) 108 (90 BASE) MCG/ACT inhaler Inhale 2 puffs into the lungs every 6 (six) hours as needed. For shortness of breATH   10/14/2015 at Unknown time  . albuterol (PROVENTIL) (2.5 MG/3ML) 0.083% nebulizer solution Take 2.5 mg by nebulization every 4 (four) hours as needed. For shortness of breath    10/14/2015 at Unknown time  . budesonide-formoterol (SYMBICORT) 160-4.5 MCG/ACT inhaler Inhale 2 puffs into the lungs 2 (two) times daily.   10/14/2015 at Unknown time  . clonazePAM (KLONOPIN) 0.5 MG tablet Take 0.5 mg by mouth 3 (three) times daily as needed for anxiety.   10/14/2015 at Unknown time  . lisinopril (PRINIVIL,ZESTRIL) 20 MG tablet Take 20 mg by mouth daily.   10/14/2015 at Unknown time  . OxyCODONE (OXYCONTIN) 10 mg T12A Take 1 tablet (10 mg total) by mouth every 12 (twelve) hours. 60 tablet 0 10/15/2015 at Unknown time  . Oxymetazoline HCl (AFRIN NASAL SPRAY NA) Place 1 spray into the nose daily as needed (Congestion).    10/14/2015 at Unknown time  . pantoprazole (PROTONIX) 40 MG tablet Take 40 mg by mouth daily.   10/14/2015 at Unknown time  . pregabalin (LYRICA) 50 MG capsule Take 50 mg by mouth every 8 (eight) hours.    10/14/2015 at Unknown time  . promethazine (PHENERGAN) 25 MG tablet Take 25 mg by mouth every 6 (six) hours as needed for nausea.    unknown  . RESTASIS 0.05 % ophthalmic emulsion Place 1 drop into both eyes 2 (two) times daily.  3 10/14/2015 at Unknown time  . tiotropium (SPIRIVA) 18 MCG inhalation capsule Place 18 mcg into inhaler and inhale  daily.   10/14/2015 at Unknown time   Scheduled: . azithromycin  500 mg Intravenous Q24H  . budesonide-formoterol  2 puff Inhalation BID  . cefTRIAXone (ROCEPHIN)  IV  1 g Intravenous Q24H  . cycloSPORINE  1 drop Both Eyes BID  . enoxaparin (LOVENOX) injection  40 mg Subcutaneous Q24H  . guaiFENesin  1,200 mg Oral BID  . insulin aspart  0-20 Units Subcutaneous TID WC  . ipratropium-albuterol  3 mL Nebulization Q6H  . lisinopril  20 mg Oral Daily  . methylPREDNISolone (SOLU-MEDROL) injection  40 mg Intravenous Q6H  . oxyCODONE  10 mg Oral Q12H  . pantoprazole  40 mg Oral Daily  . pregabalin  100 mg Oral 3 times per day   Continuous: . sodium chloride 125 mL/hr at 10/17/15 1610   RUE:[id number], albuterol, clonazePAM, ondansetron (ZOFRAN) IV, oxyCODONE, SUMAtriptan  Assesment: She was admitted with community-acquired pneumonia and sepsis versus systemic inflammatory response syndrome. She is improving but slowly. At baseline she has severe COPD and chronic hypoxic respiratory failure. She has chronic pain in her pain is not totally controlled at this time. Principal Problem:   Sepsis (HCC) Active Problems:   TOBACCO ABUSE   COPD (chronic obstructive pulmonary disease) (HCC)   DM (diabetes mellitus) (HCC)   GERD (gastroesophageal reflux disease)   COPD exacerbation (HCC)  CAP (community acquired pneumonia)   Community acquired pneumonia   Acute-on-chronic respiratory failure (HCC)   Anxiety state   HTN (hypertension)   Hypokalemia   SIRS (systemic inflammatory response syndrome) (HCC)    Plan: I have adjusted her medications. I don't think she is quite ready for transfer from the ICU yet.    LOS: 2 days   Daquavion Catala L 10/17/2015, 12:33 PM

## 2015-10-18 LAB — GLUCOSE, CAPILLARY
GLUCOSE-CAPILLARY: 299 mg/dL — AB (ref 65–99)
GLUCOSE-CAPILLARY: 297 mg/dL — AB (ref 65–99)
GLUCOSE-CAPILLARY: 398 mg/dL — AB (ref 65–99)
Glucose-Capillary: 248 mg/dL — ABNORMAL HIGH (ref 65–99)

## 2015-10-18 MED ORDER — INSULIN ASPART 100 UNIT/ML ~~LOC~~ SOLN
0.0000 [IU] | Freq: Three times a day (TID) | SUBCUTANEOUS | Status: DC
  Administered 2015-10-19: 11 [IU] via SUBCUTANEOUS

## 2015-10-18 MED ORDER — INSULIN ASPART 100 UNIT/ML ~~LOC~~ SOLN
0.0000 [IU] | Freq: Every day | SUBCUTANEOUS | Status: DC
  Administered 2015-10-18: 5 [IU] via SUBCUTANEOUS

## 2015-10-18 NOTE — Progress Notes (Signed)
Inpatient Diabetes Program Recommendations  AACE/ADA: New Consensus Statement on Inpatient Glycemic Control (2015)  Target Ranges:  Prepandial:   less than 140 mg/dL      Peak postprandial:   less than 180 mg/dL (1-2 hours)      Critically ill patients:  140 - 180 mg/dL   Review of Glycemic Control  Inpatient Diabetes Program Recommendations: While on Solumedrol, please consider adding some basal insulin, starting low dose of 10 units daily or HS.  Thank you Joyce CoffinAnn Shanai Lartigue, RN, MSN, CDE  Diabetes Inpatient Program Office: [phone] Pager: [phone] 8:00 am to 5:00 pm

## 2015-10-18 NOTE — Progress Notes (Signed)
Subjective: Patient feels much better. No fever or chills. Herr breathing is improved. No fever, no nausea or vomiting.  Objective: Vital signs in last 24 hours: Temp:  [97.5 F (36.4 C)-98.5 F (36.9 C)] 97.7 F (36.5 C) (11/25 0728) Pulse Rate:  [81-101] 81 (11/25 0400) Resp:  [22-24] 22 (11/24 1400) BP: (131-143)/(79-84) 143/82 mmHg (11/25 0400) SpO2:  [93 %-99 %] 97 % (11/25 0732) Weight:  [66.4 kg (146 lb 6.2 oz)] 66.4 kg (146 lb 6.2 oz) (11/25 0500) Weight change: 1.8 kg (3 lb 15.5 oz) Last BM Date: 10/17/15  Intake/Output from previous day: 11/24 0701 - 11/25 0700 In: 865 [P.O.:240; I.V.:625] Out: 2100 [Urine:2100]  PHYSICAL EXAM General appearance: alert and mild distress Resp: diminished breath sounds bilaterally and rhonchi bilaterally Cardio: S1, S2 normal GI: soft, non-tender; bowel sounds normal; no masses,  no organomegaly Extremities: extremities normal, atraumatic, no cyanosis or edema  Lab Results:  Results for orders placed or performed during the hospital encounter of 10/15/15 (from the past 48 hour(s))  Glucose, capillary     Status: Abnormal   Collection Time: 10/16/15 11:27 AM  Result Value Ref Range   Glucose-Capillary 215 (H) 65 - 99 mg/dL  Glucose, capillary     Status: Abnormal   Collection Time: 10/16/15  4:01 PM  Result Value Ref Range   Glucose-Capillary 309 (H) 65 - 99 mg/dL   Comment 1 Notify RN    Comment 2 Document in Chart   Glucose, capillary     Status: Abnormal   Collection Time: 10/16/15  7:55 PM  Result Value Ref Range   Glucose-Capillary 240 (H) 65 - 99 mg/dL  Glucose, capillary     Status: Abnormal   Collection Time: 10/16/15 11:59 PM  Result Value Ref Range   Glucose-Capillary 312 (H) 65 - 99 mg/dL  Glucose, capillary     Status: Abnormal   Collection Time: 10/17/15  3:56 AM  Result Value Ref Range   Glucose-Capillary 247 (H) 65 - 99 mg/dL  Glucose, capillary     Status: Abnormal   Collection Time: 10/17/15  7:12 AM   Result Value Ref Range   Glucose-Capillary 240 (H) 65 - 99 mg/dL   Comment 1 Notify RN    Comment 2 Document in Chart   Glucose, capillary     Status: Abnormal   Collection Time: 10/17/15 11:07 AM  Result Value Ref Range   Glucose-Capillary 255 (H) 65 - 99 mg/dL   Comment 1 Notify RN    Comment 2 Document in Chart   Glucose, capillary     Status: Abnormal   Collection Time: 10/17/15  5:27 PM  Result Value Ref Range   Glucose-Capillary 273 (H) 65 - 99 mg/dL   Comment 1 Document in Chart    Comment 2 Repeat Test   Glucose, capillary     Status: Abnormal   Collection Time: 10/17/15  9:38 PM  Result Value Ref Range   Glucose-Capillary 229 (H) 65 - 99 mg/dL  Glucose, capillary     Status: Abnormal   Collection Time: 10/18/15  7:27 AM  Result Value Ref Range   Glucose-Capillary 299 (H) 65 - 99 mg/dL    ABGS  Recent Labs  16/10/96 1040  PHART 7.352  PO2ART 64.2*  HCO3 30.9*   CULTURES Recent Results (from the past 240 hour(s))  Blood culture (routine x 2)     Status: None (Preliminary result)   Collection Time: 10/15/15 10:14 AM  Result Value Ref Range Status  Specimen Description BLOOD RIGHT ANTECUBITAL  Final   Special Requests BOTTLES DRAWN AEROBIC AND ANAEROBIC 10CC EACH  Final   Culture NO GROWTH 3 DAYS  Final   Report Status PENDING  Incomplete  Blood culture (routine x 2)     Status: None (Preliminary result)   Collection Time: 10/15/15 10:19 AM  Result Value Ref Range Status   Specimen Description BLOOD LEFT ARM DRAWN BY RN  Final   Special Requests BOTTLES DRAWN AEROBIC AND ANAEROBIC 5CC EACH  Final   Culture NO GROWTH 3 DAYS  Final   Report Status PENDING  Incomplete  MRSA PCR Screening     Status: None   Collection Time: 10/15/15  2:00 PM  Result Value Ref Range Status   MRSA by PCR NEGATIVE NEGATIVE Final    Comment:        The GeneXpert MRSA Assay (FDA approved for NASAL specimens only), is one component of a comprehensive MRSA  colonization surveillance program. It is not intended to diagnose MRSA infection nor to guide or monitor treatment for MRSA infections.    Studies/Results: No results found.  Medications: I have reviewed the patient's current medications.  Assesment:   Principal Problem:   Sepsis (HCC) Active Problems:   TOBACCO ABUSE   COPD (chronic obstructive pulmonary disease) (HCC)   DM (diabetes mellitus) (HCC)   GERD (gastroesophageal reflux disease)   COPD exacerbation (HCC)   CAP (community acquired pneumonia)   Community acquired pneumonia   Acute-on-chronic respiratory failure (HCC)   Anxiety state   HTN (hypertension)   Hypokalemia   SIRS (systemic inflammatory response syndrome) (HCC)    Plan:  Medications reviewed Continue IV antibiotics Continue current treatment    LOS: 3 days   Cinda Hara 10/18/2015, 10:31 AM

## 2015-10-18 NOTE — Care Management Note (Signed)
Case Management Note  Patient Details  Name: [name] MRN: [id number] Date of Birth: 11/15/1966  Subjective/Objective:                    Action/Plan:   Expected Discharge Date:                  Expected Discharge Plan:  Home/Self Care  In-House Referral:  NA  Discharge planning Services  CM Consult  Post Acute Care Choice:  NA Choice offered to:  NA  DME Arranged:    DME Agency:     HH Arranged:    HH Agency:     Status of Service:  Completed, signed off  Medicare Important Message Given:    Date Medicare IM Given:    Medicare IM give by:    Date Additional Medicare IM Given:    Additional Medicare Important Message give by:     If discussed at Long Length of Stay Meetings, dates discussed:    Additional Comments: Anticipate discharge home over the weekend. No CM needs anticipated. Pt has home O2 and neb machine in place at home. Arlyss QueenBlackwell, Kees Idrovo Dodsonrowder, RN 10/18/2015, 3:02 PM

## 2015-10-19 LAB — GLUCOSE, CAPILLARY
GLUCOSE-CAPILLARY: 286 mg/dL — AB (ref 65–99)
GLUCOSE-CAPILLARY: 253 mg/dL — AB (ref 65–99)
Glucose-Capillary: 297 mg/dL — ABNORMAL HIGH (ref 65–99)

## 2015-10-19 MED ORDER — PREDNISONE 10 MG PO TABS: ORAL_TABLET | ORAL | Status: DC

## 2015-10-19 MED ORDER — FLUCONAZOLE 150 MG PO TABS: 150.0000 mg | ORAL_TABLET | Freq: Every day | ORAL | Status: DC

## 2015-10-19 MED ORDER — DOXYCYCLINE HYCLATE 50 MG PO CAPS: 100.0000 mg | ORAL_CAPSULE | Freq: Two times a day (BID) | ORAL | Status: DC

## 2015-10-19 MED ORDER — AZITHROMYCIN 250 MG PO TABS: 500.0000 mg | ORAL_TABLET | Freq: Every day | ORAL | Status: DC

## 2015-10-19 NOTE — Discharge Summary (Signed)
Physician Discharge Summary  Patient ID: [name] MRN: [id number] DOB/AGE: 1966-02-07 49 y.o. Primary Care Physician:Addilyne Backs L, MD Admit date: 10/15/2015 Discharge date: 10/19/2015    Discharge Diagnoses:   Principal Problem:   Sepsis (HCC) Active Problems:   TOBACCO ABUSE   COPD (chronic obstructive pulmonary disease) (HCC)   DM (diabetes mellitus) (HCC)   GERD (gastroesophageal reflux disease)   COPD exacerbation (HCC)   CAP (community acquired pneumonia)   Community acquired pneumonia   Acute-on-chronic respiratory failure (HCC)   Anxiety state   HTN (hypertension)   Hypokalemia   SIRS (systemic inflammatory response syndrome) (HCC)     Medication List    TAKE these medications        acetaminophen 500 MG tablet  Commonly known as:  TYLENOL  Take 500 mg by mouth every 6 (six) hours as needed for fever.     AFRIN NASAL SPRAY NA  Place 1 spray into the nose daily as needed (Congestion).     albuterol (2.5 MG/3ML) 0.083% nebulizer solution  Commonly known as:  PROVENTIL  Take 2.5 mg by nebulization every 4 (four) hours as needed. For shortness of breath     PROAIR HFA 108 (90 BASE) MCG/ACT inhaler  Generic drug:  albuterol  Inhale 2 puffs into the lungs every 6 (six) hours as needed. For shortness of breATH     budesonide-formoterol 160-4.5 MCG/ACT inhaler  Commonly known as:  SYMBICORT  Inhale 2 puffs into the lungs 2 (two) times daily.     clonazePAM 0.5 MG tablet  Commonly known as:  KLONOPIN  Take 0.5 mg by mouth 3 (three) times daily as needed for anxiety.     doxycycline 50 MG capsule  Commonly known as:  VIBRAMYCIN  Take 2 capsules (100 mg total) by mouth 2 (two) times daily.     fluconazole 150 MG tablet  Commonly known as:  DIFLUCAN  Take 1 tablet (150 mg total) by mouth daily.     lisinopril 20 MG tablet  Commonly known as:  PRINIVIL,ZESTRIL  Take 20 mg by mouth daily.     oxyCODONE 10 mg 12 hr tablet  Commonly known as:   OXYCONTIN  Take 1 tablet (10 mg total) by mouth every 12 (twelve) hours.     pantoprazole 40 MG tablet  Commonly known as:  PROTONIX  Take 40 mg by mouth daily.     predniSONE 10 MG tablet  Commonly known as:  DELTASONE  4x3 d,3x3d,2x3d,1x3d     pregabalin 50 MG capsule  Commonly known as:  LYRICA  Take 50 mg by mouth every 8 (eight) hours.     promethazine 25 MG tablet  Commonly known as:  PHENERGAN  Take 25 mg by mouth every 6 (six) hours as needed for nausea.     RESTASIS 0.05 % ophthalmic emulsion  Generic drug:  cycloSPORINE  Place 1 drop into both eyes 2 (two) times daily.     tiotropium 18 MCG inhalation capsule  Commonly known as:  SPIRIVA  Place 18 mcg into inhaler and inhale daily.        Discharged Condition: Improved    Consults: None  Significant Diagnostic Studies: Dg Chest Port 1 View  10/15/2015  CLINICAL DATA:  Cough and congestion EXAM: PORTABLE CHEST - 1 VIEW COMPARISON:  01/29/2015 FINDINGS: Cardiac shadow is within normal limits. The lungs are well aerated bilaterally. Patchy infiltrative changes are noted in the bases bilaterally as well as within the right upper lobe. No  sizable effusion is seen. No bony abnormality is noted. IMPRESSION: Patchy infiltrates bilaterally.  Continued followup is recommended. Electronically Signed   By: Alcide CleverMark  Lukens M.D.   On: 10/15/2015 10:35    Lab Results: Basic Metabolic Panel: No results for input(s): NA, K, CL, CO2, GLUCOSE, BUN, CREATININE, CALCIUM, MG, PHOS in the last 72 hours. Liver Function Tests: No results for input(s): AST, ALT, ALKPHOS, BILITOT, PROT, ALBUMIN in the last 72 hours.   CBC: No results for input(s): WBC, NEUTROABS, HGB, HCT, MCV, PLT in the last 72 hours.  Recent Results (from the past 240 hour(s))  Blood culture (routine x 2)     Status: None (Preliminary result)   Collection Time: 10/15/15 10:14 AM  Result Value Ref Range Status   Specimen Description BLOOD RIGHT ANTECUBITAL   Final   Special Requests BOTTLES DRAWN AEROBIC AND ANAEROBIC 10CC EACH  Final   Culture NO GROWTH 3 DAYS  Final   Report Status PENDING  Incomplete  Blood culture (routine x 2)     Status: None (Preliminary result)   Collection Time: 10/15/15 10:19 AM  Result Value Ref Range Status   Specimen Description BLOOD LEFT ARM DRAWN BY RN  Final   Special Requests BOTTLES DRAWN AEROBIC AND ANAEROBIC 5CC EACH  Final   Culture NO GROWTH 3 DAYS  Final   Report Status PENDING  Incomplete  MRSA PCR Screening     Status: None   Collection Time: 10/15/15  2:00 PM  Result Value Ref Range Status   MRSA by PCR NEGATIVE NEGATIVE Final    Comment:        The GeneXpert MRSA Assay (FDA approved for NASAL specimens only), is one component of a comprehensive MRSA colonization surveillance program. It is not intended to diagnose MRSA infection nor to guide or monitor treatment for MRSA infections.      Hospital Course: This is a 49 year old who came to the emergency department with increasing shortness of breath. 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 She was hypotensive and was thought to have sepsis versus systemic inflammatory response syndrome. She was treated with fluids IV antibiotics and IV steroids and improved. She had community-acquired pneumonia on chest x-ray. Her blood sugar was uncontrolled which has been a problem when she gets on steroids but typically her blood sugar is fairly well managed at home without medication. By the time of discharge she was back at baseline. Blood pressure was better her chest was back to her baseline and expiratory wheezes and she was in no distress  Discharge Exam: Blood pressure 157/97, pulse 65, temperature 98.1 F (36.7 C), temperature source Oral, resp. rate 20, height 5' (1.524 m), weight 62.2 kg (137 lb 2 oz), SpO2 92 %. She is awake and alert. She has end expiratory wheezes. Her heart is  regular  Disposition: Home. She does not want home health services      Discharge Instructions    Discharge patient    Complete by:  As directed              Signed: Daralyn Bert L   10/19/2015, 9:18 AM

## 2015-10-19 NOTE — Progress Notes (Signed)
D/c instructions reviewed with patient and family by Britt BoozerNicky, Charity fundraiserN.  Verbalized understanding. Pt dc'd to home with family.

## 2015-10-19 NOTE — Progress Notes (Signed)
Subjective: She feels much better and wants to go home  Objective: Vital signs in last 24 hours: Temp:  [96.7 F (35.9 C)-98.1 F (36.7 C)] 98.1 F (36.7 C) (11/26 0612) Pulse Rate:  [47-92] 65 (11/26 0445) Resp:  [18-28] 20 (11/26 0445) BP: (154-175)/(67-97) 157/97 mmHg (11/26 0400) SpO2:  [88 %-97 %] 92 % (11/26 0846) Weight:  [62.2 kg (137 lb 2 oz)] 62.2 kg (137 lb 2 oz) (11/26 0612) Weight change: -4.2 kg (-9 lb 4.2 oz) Last BM Date: 10/17/15  Intake/Output from previous day: 11/25 0701 - 11/26 0700 In: 4725 [I.V.:4125; IV Piggyback:600] Out: 800 [Urine:800]  PHYSICAL EXAM General appearance: alert, cooperative and no distress Resp: She still has wheezes bilaterally but they are chronic. Cardio: regular rate and rhythm, S1, S2 normal, no murmur, click, rub or gallop GI: soft, non-tender; bowel sounds normal; no masses,  no organomegaly Extremities: extremities normal, atraumatic, no cyanosis or edema  Lab Results:  Results for orders placed or performed during the hospital encounter of 10/15/15 (from the past 48 hour(s))  Glucose, capillary     Status: Abnormal   Collection Time: 10/17/15 11:07 AM  Result Value Ref Range   Glucose-Capillary 255 (H) 65 - 99 mg/dL   Comment 1 Notify RN    Comment 2 Document in Chart   Glucose, capillary     Status: Abnormal   Collection Time: 10/17/15  5:27 PM  Result Value Ref Range   Glucose-Capillary 273 (H) 65 - 99 mg/dL   Comment 1 Document in Chart    Comment 2 Repeat Test   Glucose, capillary     Status: Abnormal   Collection Time: 10/17/15  9:38 PM  Result Value Ref Range   Glucose-Capillary 229 (H) 65 - 99 mg/dL  Glucose, capillary     Status: Abnormal   Collection Time: 10/18/15  7:27 AM  Result Value Ref Range   Glucose-Capillary 299 (H) 65 - 99 mg/dL  Glucose, capillary     Status: Abnormal   Collection Time: 10/18/15 10:59 AM  Result Value Ref Range   Glucose-Capillary 248 (H) 65 - 99 mg/dL  Glucose, capillary      Status: Abnormal   Collection Time: 10/18/15  4:07 PM  Result Value Ref Range   Glucose-Capillary 297 (H) 65 - 99 mg/dL   Comment 1 Document in Chart   Glucose, capillary     Status: Abnormal   Collection Time: 10/18/15 10:24 PM  Result Value Ref Range   Glucose-Capillary 398 (H) 65 - 99 mg/dL  Glucose, capillary     Status: Abnormal   Collection Time: 10/19/15  1:00 AM  Result Value Ref Range   Glucose-Capillary 297 (H) 65 - 99 mg/dL  Glucose, capillary     Status: Abnormal   Collection Time: 10/19/15  5:01 AM  Result Value Ref Range   Glucose-Capillary 286 (H) 65 - 99 mg/dL  Glucose, capillary     Status: Abnormal   Collection Time: 10/19/15  7:29 AM  Result Value Ref Range   Glucose-Capillary 253 (H) 65 - 99 mg/dL   Comment 1 Notify RN    Comment 2 Document in Chart     ABGS No results for input(s): PHART, PO2ART, TCO2, HCO3 in the last 72 hours.  Invalid input(s): PCO2 CULTURES Recent Results (from the past 240 hour(s))  Blood culture (routine x 2)     Status: None (Preliminary result)   Collection Time: 10/15/15 10:14 AM  Result Value Ref Range Status  Specimen Description BLOOD RIGHT ANTECUBITAL  Final   Special Requests BOTTLES DRAWN AEROBIC AND ANAEROBIC 10CC EACH  Final   Culture NO GROWTH 3 DAYS  Final   Report Status PENDING  Incomplete  Blood culture (routine x 2)     Status: None (Preliminary result)   Collection Time: 10/15/15 10:19 AM  Result Value Ref Range Status   Specimen Description BLOOD LEFT ARM DRAWN BY RN  Final   Special Requests BOTTLES DRAWN AEROBIC AND ANAEROBIC 5CC EACH  Final   Culture NO GROWTH 3 DAYS  Final   Report Status PENDING  Incomplete  MRSA PCR Screening     Status: None   Collection Time: 10/15/15  2:00 PM  Result Value Ref Range Status   MRSA by PCR NEGATIVE NEGATIVE Final    Comment:        The GeneXpert MRSA Assay (FDA approved for NASAL specimens only), is one component of a comprehensive MRSA  colonization surveillance program. It is not intended to diagnose MRSA infection nor to guide or monitor treatment for MRSA infections.    Studies/Results: No results found.  Medications:  Prior to Admission:  Prescriptions prior to admission  Medication Sig Dispense Refill Last Dose  . acetaminophen (TYLENOL) 500 MG tablet Take 500 mg by mouth every 6 (six) hours as needed for fever.    unknown  . albuterol (PROAIR HFA) 108 (90 BASE) MCG/ACT inhaler Inhale 2 puffs into the lungs every 6 (six) hours as needed. For shortness of breATH   10/14/2015 at Unknown time  . albuterol (PROVENTIL) (2.5 MG/3ML) 0.083% nebulizer solution Take 2.5 mg by nebulization every 4 (four) hours as needed. For shortness of breath    10/14/2015 at Unknown time  . budesonide-formoterol (SYMBICORT) 160-4.5 MCG/ACT inhaler Inhale 2 puffs into the lungs 2 (two) times daily.   10/14/2015 at Unknown time  . clonazePAM (KLONOPIN) 0.5 MG tablet Take 0.5 mg by mouth 3 (three) times daily as needed for anxiety.   10/14/2015 at Unknown time  . lisinopril (PRINIVIL,ZESTRIL) 20 MG tablet Take 20 mg by mouth daily.   10/14/2015 at Unknown time  . OxyCODONE (OXYCONTIN) 10 mg T12A Take 1 tablet (10 mg total) by mouth every 12 (twelve) hours. 60 tablet 0 10/15/2015 at Unknown time  . Oxymetazoline HCl (AFRIN NASAL SPRAY NA) Place 1 spray into the nose daily as needed (Congestion).    10/14/2015 at Unknown time  . pantoprazole (PROTONIX) 40 MG tablet Take 40 mg by mouth daily.   10/14/2015 at Unknown time  . pregabalin (LYRICA) 50 MG capsule Take 50 mg by mouth every 8 (eight) hours.    10/14/2015 at Unknown time  . promethazine (PHENERGAN) 25 MG tablet Take 25 mg by mouth every 6 (six) hours as needed for nausea.    unknown  . RESTASIS 0.05 % ophthalmic emulsion Place 1 drop into both eyes 2 (two) times daily.  3 10/14/2015 at Unknown time  . tiotropium (SPIRIVA) 18 MCG inhalation capsule Place 18 mcg into inhaler and inhale  daily.   10/14/2015 at Unknown time   Scheduled: . azithromycin  500 mg Oral q1800  . budesonide-formoterol  2 puff Inhalation BID  . cefTRIAXone (ROCEPHIN)  IV  1 g Intravenous Q24H  . cycloSPORINE  1 drop Both Eyes BID  . enoxaparin (LOVENOX) injection  40 mg Subcutaneous Q24H  . guaiFENesin  1,200 mg Oral BID  . insulin aspart  0-20 Units Subcutaneous TID WC  . insulin aspart  0-5  Units Subcutaneous QHS  . ipratropium-albuterol  3 mL Nebulization Q6H  . lisinopril  20 mg Oral Daily  . methylPREDNISolone (SOLU-MEDROL) injection  40 mg Intravenous Q6H  . oxyCODONE  10 mg Oral Q12H  . pantoprazole  40 mg Oral Daily  . pregabalin  100 mg Oral 3 times per day   Continuous: . sodium chloride 125 mL/hr at 10/19/15 0500   JYN:[id number], albuterol, clonazePAM, ondansetron (ZOFRAN) IV, oxyCODONE, SUMAtriptan  Assesment: She was admitted with community-acquired pneumonia sepsis versus systemic inflammatory response syndrome acute on chronic respiratory failure COPD exacerbation and has improved markedly. Typically she has diet controlled diabetes but with steroids and acute illness her blood sugar has been up. She is remarkably improved and back to baseline and is ready for discharge Principal Problem:   Sepsis (HCC) Active Problems:   TOBACCO ABUSE   COPD (chronic obstructive pulmonary disease) (HCC)   DM (diabetes mellitus) (HCC)   GERD (gastroesophageal reflux disease)   COPD exacerbation (HCC)   CAP (community acquired pneumonia)   Community acquired pneumonia   Acute-on-chronic respiratory failure (HCC)   Anxiety state   HTN (hypertension)   Hypokalemia   SIRS (systemic inflammatory response syndrome) (HCC)    Plan: Discharge home today    LOS: 4 days   [name] L 10/19/2015, 9:16 AM

## 2015-10-19 NOTE — Progress Notes (Signed)
PHARMACIST - PHYSICIAN COMMUNICATION DR:   Hawkins CONCERNING: Antibiotic IV to Oral Route Change Policy  RECOMMENDATION: This patient is receiving Zithromax by the intravenous route.  Based on criteria approved by the Pharmacy and Therapeutics Committee, the antibiotic(s) is/are being converted to the equivalent oral dose form(s).   DESCRIPTION: These criteria include:  Patient being treated for a respiratory tract infection, urinary tract infection, cellulitis or clostridium difficile associated diarrhea if on metronidazole  The patient is not neutropenic and does not exhibit a GI malabsorption state  The patient is eating (either orally or via tube) and/or has been taking other orally administered medications for a least 24 hours  The patient is improving clinically and has a Tmax < 100.5  If you have questions about this conversion, please contact the Pharmacy Department  [x]  ( [phone] )  [place] []  ( [phone] )  [facility] []  ( [phone]   []  ( [phone]  [facility] []  ( [phone] )  [facility]     S. Amada Hallisey, PharmD 

## 2015-10-20 LAB — CULTURE, BLOOD (ROUTINE X 2)
CULTURE: NO GROWTH
CULTURE: NO GROWTH

## 2016-02-19 ENCOUNTER — Other Ambulatory Visit (HOSPITAL_COMMUNITY): Payer: Self-pay | Admitting: Pulmonary Disease

## 2016-02-19 DIAGNOSIS — R269 Unspecified abnormalities of gait and mobility: Secondary | ICD-10-CM

## 2016-02-25 ENCOUNTER — Ambulatory Visit (HOSPITAL_COMMUNITY): Payer: Medicaid Other

## 2016-03-02 ENCOUNTER — Ambulatory Visit (HOSPITAL_COMMUNITY): Payer: Medicaid Other

## 2016-03-05 IMAGING — CR DG CHEST 2V
2 series · 2 of 2 positions shown · non-contrast
Comparison: 08/14/2014, 03/11/2014, 04/21/2013.

CLINICAL DATA: Shortness of breath.

EXAM:
CHEST  2 VIEW

[view not recorded (1 of 2)]
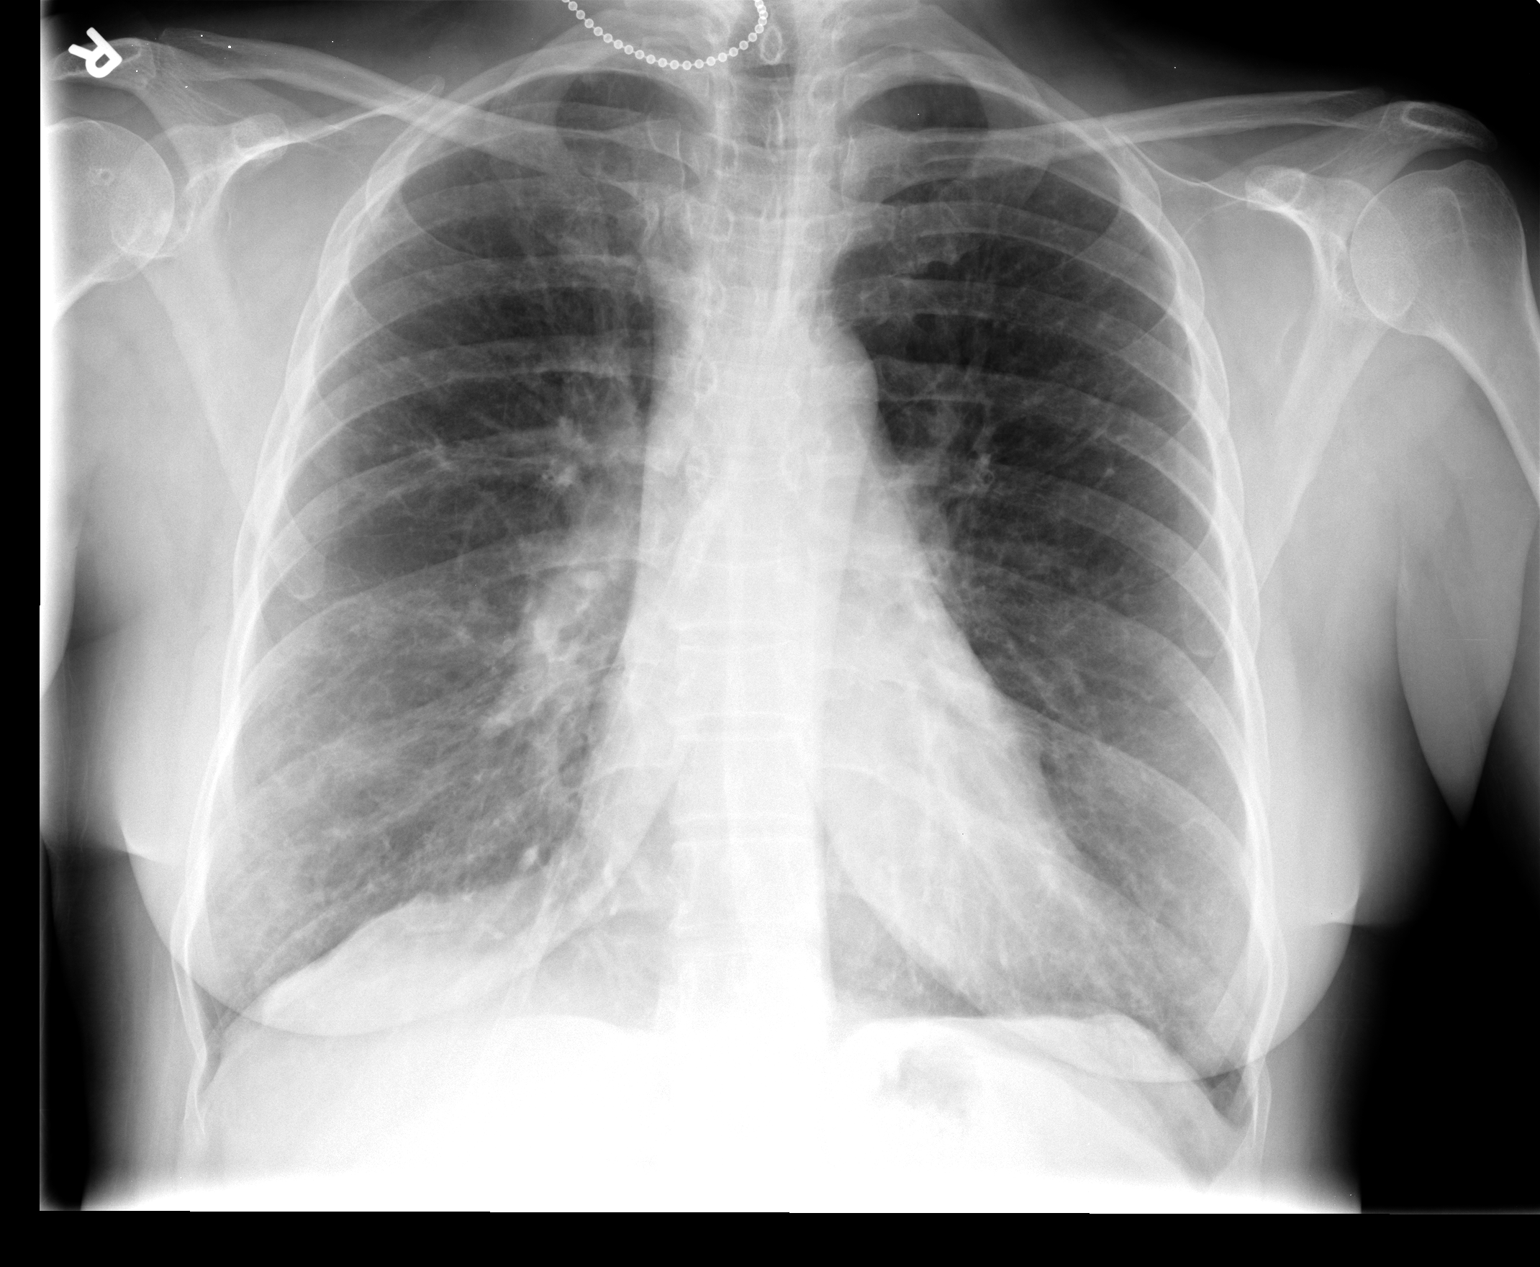

[view not recorded (2 of 2)]
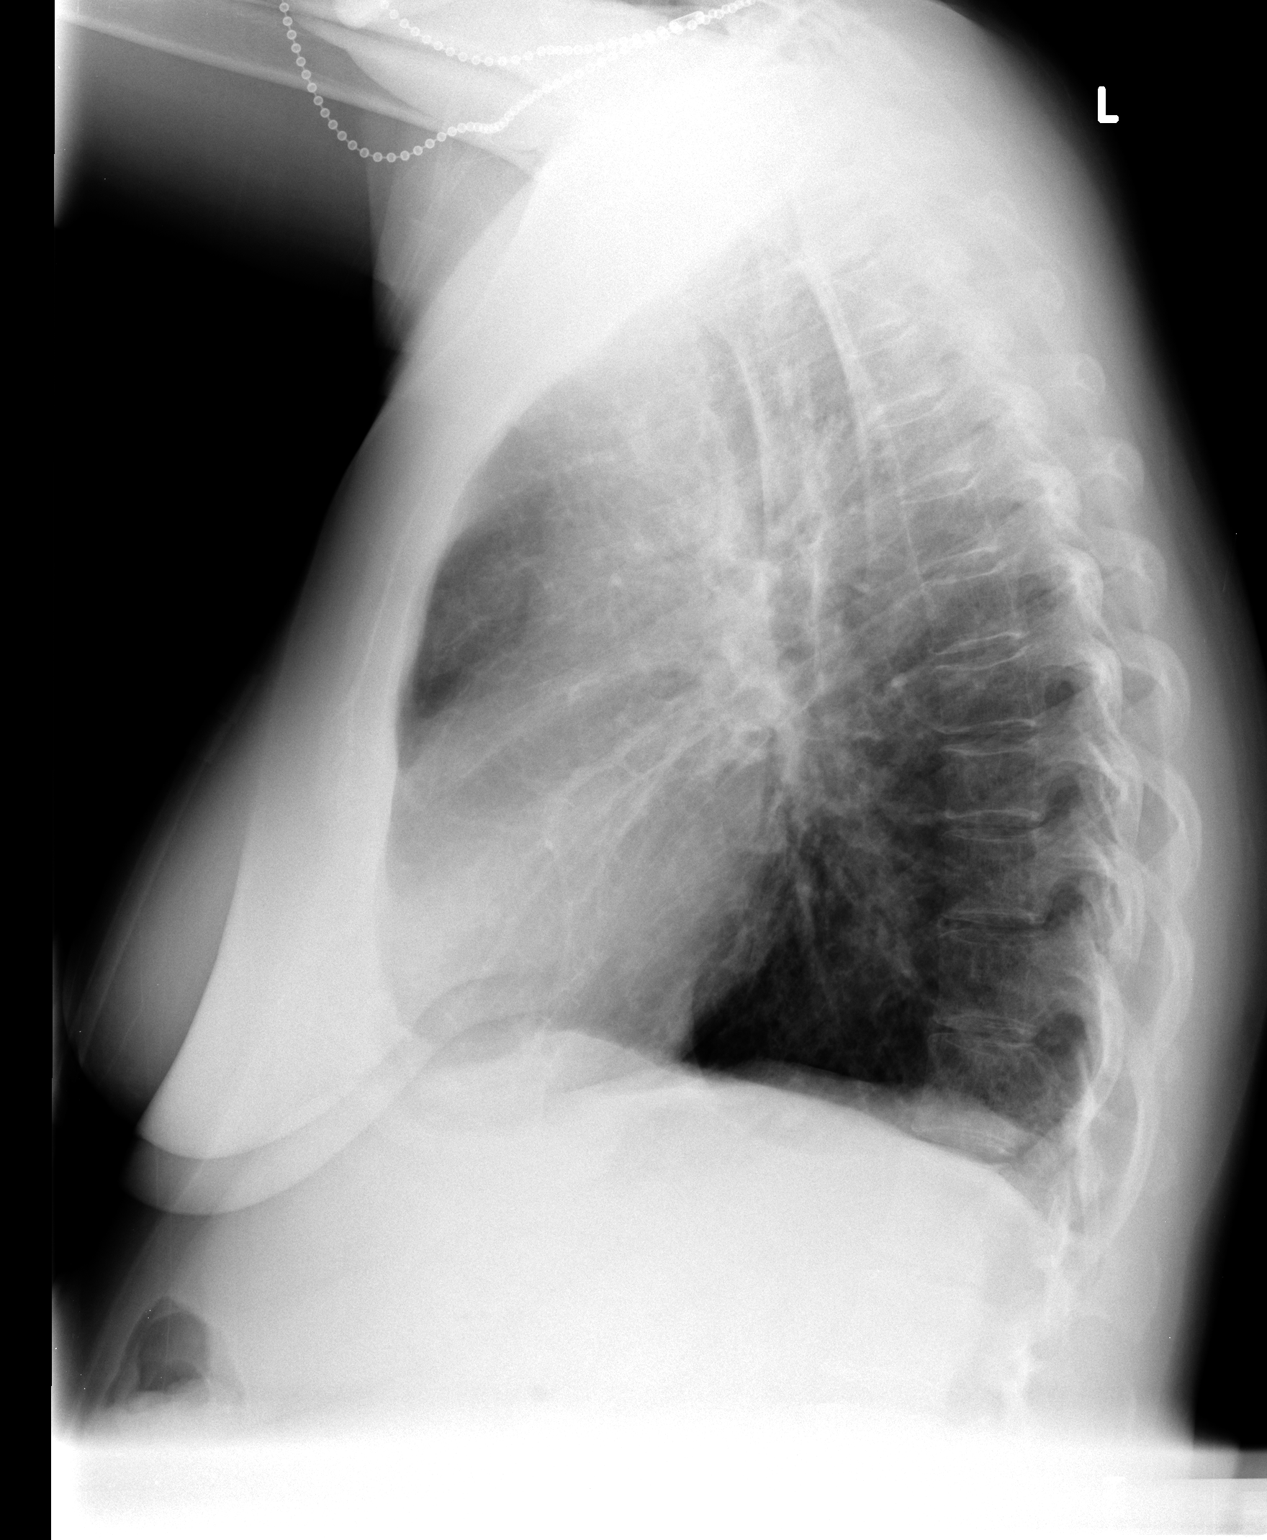

[2 of 2 positions shown; findings below may reference images not displayed]

FINDINGS: Mediastinum and hilar structures are normal. Lungs are clear of
acute infiltrates. Infiltrate noted in the right lung base on prior
chest x-ray of 03/11/2014 has cleared. Mild right base pleural
parenchymal thickening noted consistent scarring. Heart size normal.
No pleural effusion or pneumothorax.
IMPRESSION: Mild right base pleural parenchymal thickening most consistent with
scarring. No acute abnormality identified.

## 2016-05-30 ENCOUNTER — Emergency Department (HOSPITAL_COMMUNITY): Payer: Medicaid Other

## 2016-05-30 ENCOUNTER — Encounter (HOSPITAL_COMMUNITY): Payer: Self-pay | Admitting: *Deleted

## 2016-05-30 ENCOUNTER — Inpatient Hospital Stay (HOSPITAL_COMMUNITY)
Admission: EM | Admit: 2016-05-30 | Discharge: 2016-06-01 | DRG: 190 | Disposition: A | Discharge status: Home / Self Care | Payer: Medicaid Other | Attending: Pulmonary Disease | Admitting: Pulmonary Disease

## 2016-05-30 DIAGNOSIS — J9602 Acute respiratory failure with hypercapnia: Secondary | ICD-10-CM

## 2016-05-30 DIAGNOSIS — Z833 Family history of diabetes mellitus: Secondary | ICD-10-CM | POA: Diagnosis not present

## 2016-05-30 DIAGNOSIS — J8410 Pulmonary fibrosis, unspecified: Secondary | ICD-10-CM | POA: Diagnosis present

## 2016-05-30 DIAGNOSIS — R6510 Systemic inflammatory response syndrome (SIRS) of non-infectious origin without acute organ dysfunction: Secondary | ICD-10-CM

## 2016-05-30 DIAGNOSIS — G8929 Other chronic pain: Secondary | ICD-10-CM | POA: Diagnosis present

## 2016-05-30 DIAGNOSIS — J189 Pneumonia, unspecified organism: Secondary | ICD-10-CM | POA: Diagnosis present

## 2016-05-30 DIAGNOSIS — Z9981 Dependence on supplemental oxygen: Secondary | ICD-10-CM | POA: Diagnosis not present

## 2016-05-30 DIAGNOSIS — K219 Gastro-esophageal reflux disease without esophagitis: Secondary | ICD-10-CM | POA: Diagnosis present

## 2016-05-30 DIAGNOSIS — K529 Noninfective gastroenteritis and colitis, unspecified: Secondary | ICD-10-CM | POA: Diagnosis present

## 2016-05-30 DIAGNOSIS — M545 Low back pain, unspecified: Secondary | ICD-10-CM | POA: Diagnosis present

## 2016-05-30 DIAGNOSIS — J440 Chronic obstructive pulmonary disease with acute lower respiratory infection: Secondary | ICD-10-CM | POA: Diagnosis present

## 2016-05-30 DIAGNOSIS — F411 Generalized anxiety disorder: Secondary | ICD-10-CM | POA: Diagnosis present

## 2016-05-30 DIAGNOSIS — Z7951 Long term (current) use of inhaled steroids: Secondary | ICD-10-CM | POA: Diagnosis not present

## 2016-05-30 DIAGNOSIS — I10 Essential (primary) hypertension: Secondary | ICD-10-CM | POA: Diagnosis present

## 2016-05-30 DIAGNOSIS — J9622 Acute and chronic respiratory failure with hypercapnia: Secondary | ICD-10-CM

## 2016-05-30 DIAGNOSIS — E119 Type 2 diabetes mellitus without complications: Secondary | ICD-10-CM

## 2016-05-30 DIAGNOSIS — J441 Chronic obstructive pulmonary disease with (acute) exacerbation: Secondary | ICD-10-CM | POA: Diagnosis present

## 2016-05-30 DIAGNOSIS — J9620 Acute and chronic respiratory failure, unspecified whether with hypoxia or hypercapnia: Secondary | ICD-10-CM | POA: Diagnosis present

## 2016-05-30 DIAGNOSIS — J9621 Acute and chronic respiratory failure with hypoxia: Secondary | ICD-10-CM | POA: Diagnosis present

## 2016-05-30 DIAGNOSIS — J9601 Acute respiratory failure with hypoxia: Secondary | ICD-10-CM

## 2016-05-30 DIAGNOSIS — F17200 Nicotine dependence, unspecified, uncomplicated: Secondary | ICD-10-CM

## 2016-05-30 DIAGNOSIS — E876 Hypokalemia: Secondary | ICD-10-CM | POA: Diagnosis present

## 2016-05-30 DIAGNOSIS — Z8249 Family history of ischemic heart disease and other diseases of the circulatory system: Secondary | ICD-10-CM | POA: Diagnosis not present

## 2016-05-30 DIAGNOSIS — F17210 Nicotine dependence, cigarettes, uncomplicated: Secondary | ICD-10-CM | POA: Diagnosis present

## 2016-05-30 DIAGNOSIS — A419 Sepsis, unspecified organism: Secondary | ICD-10-CM

## 2016-05-30 DIAGNOSIS — Z79891 Long term (current) use of opiate analgesic: Secondary | ICD-10-CM

## 2016-05-30 DIAGNOSIS — R509 Fever, unspecified: Secondary | ICD-10-CM | POA: Diagnosis not present

## 2016-05-30 LAB — BLOOD GAS, ARTERIAL
ACID-BASE EXCESS: 8 mmol/L — AB (ref 0.0–2.0)
BICARBONATE: 29.8 meq/L — AB (ref 20.0–24.0)
Drawn by: 277331
O2 CONTENT: 3 L/min
O2 Saturation: 89.6 %
PATIENT TEMPERATURE: 37
PCO2 ART: 75 mmHg — AB (ref 35.0–45.0)
PO2 ART: 62 mmHg — AB (ref 80.0–100.0)
TCO2: 75 mmol/L (ref 0–100)
pH, Arterial: 7.285 — ABNORMAL LOW (ref 7.350–7.450)

## 2016-05-30 LAB — URINALYSIS, ROUTINE W REFLEX MICROSCOPIC
BILIRUBIN URINE: NEGATIVE
Glucose, UA: NEGATIVE mg/dL
Ketones, ur: NEGATIVE mg/dL
Leukocytes, UA: NEGATIVE
NITRITE: NEGATIVE
Specific Gravity, Urine: 1.025 (ref 1.005–1.030)
pH: 6 (ref 5.0–8.0)

## 2016-05-30 LAB — COMPREHENSIVE METABOLIC PANEL
ALT: 10 U/L — ABNORMAL LOW (ref 14–54)
ANION GAP: 6 (ref 5–15)
AST: 15 U/L (ref 15–41)
Albumin: 3.6 g/dL (ref 3.5–5.0)
Alkaline Phosphatase: 68 U/L (ref 38–126)
BUN: 11 mg/dL (ref 6–20)
CHLORIDE: 91 mmol/L — AB (ref 101–111)
CO2: 37 mmol/L — AB (ref 22–32)
Calcium: 8.7 mg/dL — ABNORMAL LOW (ref 8.9–10.3)
Creatinine, Ser: 0.48 mg/dL (ref 0.44–1.00)
Glucose, Bld: 170 mg/dL — ABNORMAL HIGH (ref 65–99)
Potassium: 3.2 mmol/L — ABNORMAL LOW (ref 3.5–5.1)
SODIUM: 134 mmol/L — AB (ref 135–145)
Total Bilirubin: 0.5 mg/dL (ref 0.3–1.2)
Total Protein: 7.3 g/dL (ref 6.5–8.1)

## 2016-05-30 LAB — MAGNESIUM: Magnesium: 1.4 mg/dL — ABNORMAL LOW (ref 1.7–2.4)

## 2016-05-30 LAB — URINE MICROSCOPIC-ADD ON

## 2016-05-30 LAB — CBC
HCT: 41.7 % (ref 36.0–46.0)
HEMOGLOBIN: 13.3 g/dL (ref 12.0–15.0)
MCH: 27.7 pg (ref 26.0–34.0)
MCHC: 31.9 g/dL (ref 30.0–36.0)
MCV: 86.7 fL (ref 78.0–100.0)
PLATELETS: 289 10*3/uL (ref 150–400)
RBC: 4.81 MIL/uL (ref 3.87–5.11)
RDW: 12.5 % (ref 11.5–15.5)
WBC: 17.9 10*3/uL — AB (ref 4.0–10.5)

## 2016-05-30 LAB — PHOSPHORUS: Phosphorus: 3 mg/dL (ref 2.5–4.6)

## 2016-05-30 LAB — I-STAT BETA HCG BLOOD, ED (MC, WL, AP ONLY): I-stat hCG, quantitative: <5 m[IU]/mL (ref <5)

## 2016-05-30 LAB — LIPASE, BLOOD: LIPASE: 12 U/L (ref 11–51)

## 2016-05-30 LAB — CBG MONITORING, ED: GLUCOSE-CAPILLARY: 175 mg/dL — AB (ref 65–99)

## 2016-05-30 LAB — I-STAT CG4 LACTIC ACID, ED
LACTIC ACID, VENOUS: 0.51 mmol/L (ref 0.5–1.9)
LACTIC ACID, VENOUS: 0.7 mmol/L (ref 0.5–1.9)

## 2016-05-30 MED ORDER — OXYCODONE HCL 5 MG PO TABS
10.0000 mg | ORAL_TABLET | ORAL | Status: DC | PRN
  Administered 2016-05-31 – 2016-06-01 (×7): 10 mg via ORAL
  Filled 2016-05-30 (×7): qty 2

## 2016-05-30 MED ORDER — SODIUM CHLORIDE 0.9 % IV BOLUS (SEPSIS)
1000.0000 mL | Freq: Once | INTRAVENOUS | Status: AC
  Administered 2016-05-30: 1000 mL via INTRAVENOUS

## 2016-05-30 MED ORDER — CLONAZEPAM 0.5 MG PO TABS
0.5000 mg | ORAL_TABLET | Freq: Three times a day (TID) | ORAL | Status: DC | PRN
  Administered 2016-05-31 – 2016-06-01 (×2): 0.5 mg via ORAL
  Filled 2016-05-30 (×3): qty 1

## 2016-05-30 MED ORDER — FENTANYL CITRATE (PF) 100 MCG/2ML IJ SOLN
50.0000 ug | Freq: Once | INTRAMUSCULAR | Status: AC
Start: 2016-05-30 — End: 2016-05-30
  Administered 2016-05-30: 50 ug via INTRAVENOUS
  Filled 2016-05-30: qty 2

## 2016-05-30 MED ORDER — IPRATROPIUM-ALBUTEROL 0.5-2.5 (3) MG/3ML IN SOLN
3.0000 mL | Freq: Four times a day (QID) | RESPIRATORY_TRACT | Status: DC
  Administered 2016-05-30 – 2016-06-01 (×7): 3 mL via RESPIRATORY_TRACT
  Filled 2016-05-30 (×7): qty 3

## 2016-05-30 MED ORDER — ENOXAPARIN SODIUM 40 MG/0.4ML ~~LOC~~ SOLN
40.0000 mg | SUBCUTANEOUS | Status: DC
  Administered 2016-05-30 – 2016-05-31 (×2): 40 mg via SUBCUTANEOUS
  Filled 2016-05-30 (×2): qty 0.4

## 2016-05-30 MED ORDER — OXYMETAZOLINE HCL 0.05 % NA SOLN
1.0000 | Freq: Two times a day (BID) | NASAL | Status: DC
  Administered 2016-05-30 – 2016-05-31 (×3): 1 via NASAL

## 2016-05-30 MED ORDER — INSULIN ASPART 100 UNIT/ML ~~LOC~~ SOLN
0.0000 [IU] | Freq: Every day | SUBCUTANEOUS | Status: DC
  Administered 2016-05-30 – 2016-05-31 (×2): 2 [IU] via SUBCUTANEOUS

## 2016-05-30 MED ORDER — DEXTROSE 5 % IV SOLN
INTRAVENOUS | Status: AC
  Filled 2016-05-30 (×2): qty 1

## 2016-05-30 MED ORDER — CYCLOSPORINE 0.05 % OP EMUL
1.0000 [drp] | Freq: Two times a day (BID) | OPHTHALMIC | Status: DC
  Administered 2016-05-30 – 2016-05-31 (×2): 1 [drp] via OPHTHALMIC
  Filled 2016-05-30 (×4): qty 1

## 2016-05-30 MED ORDER — VANCOMYCIN HCL IN DEXTROSE 1-5 GM/200ML-% IV SOLN
1000.0000 mg | Freq: Once | INTRAVENOUS | Status: AC
  Administered 2016-05-30: 1000 mg via INTRAVENOUS
  Filled 2016-05-30: qty 200

## 2016-05-30 MED ORDER — AZTREONAM 2 G IJ SOLR
2.0000 g | Freq: Once | INTRAMUSCULAR | Status: AC
  Administered 2016-05-30: 2 g via INTRAVENOUS
  Filled 2016-05-30: qty 2

## 2016-05-30 MED ORDER — IPRATROPIUM-ALBUTEROL 0.5-2.5 (3) MG/3ML IN SOLN: 3.0000 mL | Freq: Four times a day (QID) | RESPIRATORY_TRACT | Status: DC | PRN

## 2016-05-30 MED ORDER — IOPAMIDOL (ISOVUE-300) INJECTION 61%
100.0000 mL | Freq: Once | INTRAVENOUS | Status: AC | PRN
  Administered 2016-05-30: 100 mL via INTRAVENOUS

## 2016-05-30 MED ORDER — BUDESONIDE 0.25 MG/2ML IN SUSP
0.2500 mg | Freq: Two times a day (BID) | RESPIRATORY_TRACT | Status: DC
  Administered 2016-05-30 – 2016-06-01 (×4): 0.25 mg via RESPIRATORY_TRACT
  Filled 2016-05-30 (×4): qty 2

## 2016-05-30 MED ORDER — PANTOPRAZOLE SODIUM 40 MG PO TBEC
40.0000 mg | DELAYED_RELEASE_TABLET | Freq: Every day | ORAL | Status: DC
  Administered 2016-05-30 – 2016-05-31 (×2): 40 mg via ORAL
  Filled 2016-05-30 (×2): qty 1

## 2016-05-30 MED ORDER — DIATRIZOATE MEGLUMINE & SODIUM 66-10 % PO SOLN
ORAL | Status: AC
  Administered 2016-05-30: 30 mL via ORAL
  Filled 2016-05-30: qty 30

## 2016-05-30 MED ORDER — VANCOMYCIN HCL IN DEXTROSE 750-5 MG/150ML-% IV SOLN
INTRAVENOUS | Status: AC
  Filled 2016-05-30: qty 150

## 2016-05-30 MED ORDER — ALBUTEROL (5 MG/ML) CONTINUOUS INHALATION SOLN
10.0000 mg/h | INHALATION_SOLUTION | Freq: Once | RESPIRATORY_TRACT | Status: AC
  Administered 2016-05-30: 10 mg/h via RESPIRATORY_TRACT
  Filled 2016-05-30: qty 20

## 2016-05-30 MED ORDER — IBUPROFEN 800 MG PO TABS
800.0000 mg | ORAL_TABLET | Freq: Once | ORAL | Status: AC
  Administered 2016-05-30: 800 mg via ORAL
  Filled 2016-05-30: qty 1

## 2016-05-30 MED ORDER — OXYCODONE HCL ER 10 MG PO T12A
10.0000 mg | EXTENDED_RELEASE_TABLET | Freq: Two times a day (BID) | ORAL | Status: DC
  Administered 2016-05-30 – 2016-05-31 (×3): 10 mg via ORAL
  Filled 2016-05-30 (×3): qty 1

## 2016-05-30 MED ORDER — VANCOMYCIN HCL IN DEXTROSE 750-5 MG/150ML-% IV SOLN
750.0000 mg | Freq: Two times a day (BID) | INTRAVENOUS | Status: DC
  Administered 2016-05-31 – 2016-06-01 (×3): 750 mg via INTRAVENOUS
  Filled 2016-05-30 (×5): qty 150

## 2016-05-30 MED ORDER — CLONAZEPAM 0.5 MG PO TABS
0.5000 mg | ORAL_TABLET | Freq: Once | ORAL | Status: AC
Start: 2016-05-30 — End: 2016-05-30
  Administered 2016-05-30: 0.5 mg via ORAL
  Filled 2016-05-30: qty 1

## 2016-05-30 MED ORDER — INSULIN ASPART 100 UNIT/ML ~~LOC~~ SOLN
0.0000 [IU] | Freq: Three times a day (TID) | SUBCUTANEOUS | Status: DC
  Administered 2016-05-31: 3 [IU] via SUBCUTANEOUS
  Administered 2016-05-31: 5 [IU] via SUBCUTANEOUS
  Administered 2016-05-31: 3 [IU] via SUBCUTANEOUS
  Administered 2016-06-01: 2 [IU] via SUBCUTANEOUS

## 2016-05-30 MED ORDER — POTASSIUM CHLORIDE IN NACL 20-0.9 MEQ/L-% IV SOLN
INTRAVENOUS | Status: DC
  Administered 2016-05-30 – 2016-05-31 (×3): via INTRAVENOUS

## 2016-05-30 MED ORDER — IPRATROPIUM-ALBUTEROL 0.5-2.5 (3) MG/3ML IN SOLN
3.0000 mL | Freq: Once | RESPIRATORY_TRACT | Status: AC
  Administered 2016-05-30: 3 mL via RESPIRATORY_TRACT
  Filled 2016-05-30: qty 3

## 2016-05-30 MED ORDER — OXYMETAZOLINE HCL 0.05 % NA SOLN
NASAL | Status: AC
  Filled 2016-05-30: qty 15

## 2016-05-30 MED ORDER — METHYLPREDNISOLONE SODIUM SUCC 40 MG IJ SOLR
40.0000 mg | Freq: Two times a day (BID) | INTRAMUSCULAR | Status: DC
  Administered 2016-05-30 – 2016-06-01 (×4): 40 mg via INTRAVENOUS
  Filled 2016-05-30 (×4): qty 1

## 2016-05-30 MED ORDER — PREGABALIN 50 MG PO CAPS
50.0000 mg | ORAL_CAPSULE | Freq: Three times a day (TID) | ORAL | Status: DC
  Administered 2016-05-30 – 2016-06-01 (×5): 50 mg via ORAL
  Filled 2016-05-30 (×5): qty 1

## 2016-05-30 MED ORDER — METOCLOPRAMIDE HCL 5 MG/ML IJ SOLN: 5.0000 mg | Freq: Four times a day (QID) | INTRAMUSCULAR | Status: DC | PRN

## 2016-05-30 MED ORDER — POTASSIUM CHLORIDE 20 MEQ PO PACK
40.0000 meq | PACK | Freq: Once | ORAL | Status: AC
  Administered 2016-05-30: 40 meq via ORAL
  Filled 2016-05-30: qty 2

## 2016-05-30 MED ORDER — DEXTROSE 5 % IV SOLN
1.0000 g | Freq: Three times a day (TID) | INTRAVENOUS | Status: DC
  Administered 2016-05-30 – 2016-06-01 (×5): 1 g via INTRAVENOUS
  Filled 2016-05-30 (×9): qty 1

## 2016-05-30 MED ORDER — CYCLOSPORINE 0.05 % OP EMUL
OPHTHALMIC | Status: AC
Start: 2016-05-30 — End: 2016-05-30
  Filled 2016-05-30: qty 1

## 2016-05-30 NOTE — H&P (Signed)
History and Physical  [name]:[id number] DOB: 1966-04-23 DOA: 05/30/2016  Referring physician: ER Physician PCP: Fredirick Maudlin, MD  Outpatient Specialists:    Patient coming from: Home  Chief Complaint: Fever, productive cough and SOB  HPI: 50 year old female with history of COPD on 3L home oxygen, continuous tobacco user, amongst other medical problems. Patient's problem started about one week ago with nausea, vomiting and diarrhea that resolved yesterday. Patient has had associated cough that is productive of greenish sputum. Patient developed fever today, and reported temperature of 102.15F, with associated SOB and wheezing. CXR reveals pneumonia. CT abdomen did not reveal any significant abnormality. Leukocytosis is noted, and Lactic acid is within normal range. No headache, no neck pain, no chest pain, no urinary symptoms. On presentation, patient was hypotensive, but responded to fluid resuscitation. Low potassium is noted.  ED Course: Volume resuscitation, pan culture, IV antibiotics  Pertinent labs: As in HPI. ABG on presentation revealed PH of 7.28/PCO2 of 75 and PO2 of 62 on 3L. EKG: Independently reviewed.  Imaging: independently reviewed.  Review of Systems: As in HPI. 12 systems were reviewed. Negative for visual changes, sore throat, rash, new muscle aches, chest pain, dysuria, bleeding.  Past Medical History  Diagnosis Date  . Migraine headache   . On home O2   . COPD (chronic obstructive pulmonary disease) (HCC)   . Chronic back pain   . Hyperglycemia, drug-induced     steroid induced hyperglycemia  . ARDS (adult respiratory distress syndrome) [facility])     Jan 2011  . Diabetes mellitus   . Pneumonia   . Angina   . Asthma   . Shortness of breath   . Recurrent upper respiratory infection (URI)   . GERD (gastroesophageal reflux disease) 12/21/2012  . Anxiety state 10/15/2015  . HTN (hypertension) 10/15/2015    Past Surgical History  Procedure Laterality  Date  . Tubal ligation    . Uterine ablasion    . C-section    . Tracheostomy      decannulated 12/2009     reports that she has been smoking Cigarettes.  She has a 8.25 pack-year smoking history. She does not have any smokeless tobacco history on file. She reports that she does not drink alcohol or use illicit drugs.  Allergies  Allergen Reactions  . Penicillins Other (See Comments)    convulsions  . Levaquin [Levofloxacin In D5w] Itching and Swelling  . Morphine Nausea Only    Family History  Problem Relation Age of Onset  . Coronary artery disease Brother   . Diabetes Other   . Cancer Other   . Hypertension Other      Prior to Admission medications   Medication Sig Start Date End Date Taking? Authorizing Provider  acetaminophen (TYLENOL) 500 MG tablet Take 500 mg by mouth every 6 (six) hours as needed for fever.     Historical Provider, MD  albuterol (PROAIR HFA) 108 (90 BASE) MCG/ACT inhaler Inhale 2 puffs into the lungs every 6 (six) hours as needed. For shortness of breATH    Historical Provider, MD  albuterol (PROVENTIL) (2.5 MG/3ML) 0.083% nebulizer solution Take 2.5 mg by nebulization every 4 (four) hours as needed. For shortness of breath  03/19/11   Dorean S Parrett, NP  budesonide-formoterol (SYMBICORT) 160-4.5 MCG/ACT inhaler Inhale 2 puffs into the lungs 2 (two) times daily.    Historical Provider, MD  clonazePAM (KLONOPIN) 0.5 MG tablet Take 0.5 mg by mouth 3 (three) times daily as  needed for anxiety.    Historical Provider, MD  lisinopril (PRINIVIL,ZESTRIL) 20 MG tablet Take 20 mg by mouth daily.    Historical Provider, MD  OxyCODONE (OXYCONTIN) 10 mg T12A Take 1 tablet (10 mg total) by mouth every 12 (twelve) hours. 04/24/13   Kari Baars, MD  Oxymetazoline HCl (AFRIN NASAL SPRAY NA) Place 1 spray into the nose daily as needed (Congestion).     Historical Provider, MD  pantoprazole (PROTONIX) 40 MG tablet Take 40 mg by mouth daily.    Historical Provider, MD    pregabalin (LYRICA) 50 MG capsule Take 50 mg by mouth every 8 (eight) hours.     Historical Provider, MD  promethazine (PHENERGAN) 25 MG tablet Take 25 mg by mouth every 6 (six) hours as needed for nausea.     Historical Provider, MD  RESTASIS 0.05 % ophthalmic emulsion Place 1 drop into both eyes 2 (two) times daily. 09/28/15   Historical Provider, MD  tiotropium (SPIRIVA) 18 MCG inhalation capsule Place 18 mcg into inhaler and inhale daily.    Historical Provider, MD    Physical Exam: Filed Vitals:   05/30/16 1809 05/30/16 1830 05/30/16 1836 05/30/16 1907  BP:  108/67 108/67 116/76  Pulse: 109 100 68 98  Temp:    99.5 F (37.5 C)  TempSrc:    Oral  Resp: 20 15 17 20   Height:    5' (1.524 m)  Weight:    59.875 kg (132 lb)  SpO2: 93%  90% 91%   Constitutional:  . Appears calm and comfortable Eyes:  . No pallor. No jaundice.  ENMT:  . external ears, nose appear normal Neck:  . Neck is supple. No JVD Respiratory:  . Decreased air entry with wheeze. Cardiovascular:  . S1S2 . No LE extremity edema   Abdomen:  . Abdomen is soft and non tender. Organs are difficult to assess. Neurologic:  . Awake and alert. . Moves all limbs.  Wt Readings from Last 3 Encounters:  05/30/16 59.875 kg (132 lb)  10/19/15 62.2 kg (137 lb 2 oz)  08/15/14 64.1 kg (141 lb 5 oz)    I have personally reviewed following labs and imaging studies  Labs on Admission:  CBC:  Recent Labs Lab 05/30/16 1431  WBC 17.9*  HGB 13.3  HCT 41.7  MCV 86.7  PLT 289   Basic Metabolic Panel:  Recent Labs Lab 05/30/16 1431  NA 134*  K 3.2*  CL 91*  CO2 37*  GLUCOSE 170*  BUN 11  CREATININE 0.48  CALCIUM 8.7*   Liver Function Tests:  Recent Labs Lab 05/30/16 1431  AST 15  ALT 10*  ALKPHOS 68  BILITOT 0.5  PROT 7.3  ALBUMIN 3.6    Recent Labs Lab 05/30/16 1431  LIPASE 12   No results for input(s): AMMONIA in the last 168 hours. Coagulation Profile: No results for input(s): INR,  PROTIME in the last 168 hours. Cardiac Enzymes: No results for input(s): CKTOTAL, CKMB, CKMBINDEX, TROPONINI in the last 168 hours. BNP (last 3 results) No results for input(s): PROBNP in the last 8760 hours. HbA1C: No results for input(s): HGBA1C in the last 72 hours. CBG:  Recent Labs Lab 05/30/16 1358  GLUCAP 175*   Lipid Profile: No results for input(s): CHOL, HDL, LDLCALC, TRIG, CHOLHDL, LDLDIRECT in the last 72 hours. Thyroid Function Tests: No results for input(s): TSH, T4TOTAL, FREET4, T3FREE, THYROIDAB in the last 72 hours. Anemia Panel: No results for input(s): VITAMINB12, FOLATE, FERRITIN, TIBC,  IRON, RETICCTPCT in the last 72 hours. Urine analysis:    Component Value Date/Time   COLORURINE YELLOW 05/30/2016 1507   APPEARANCEUR CLEAR 05/30/2016 1507   LABSPEC 1.025 05/30/2016 1507   PHURINE 6.0 05/30/2016 1507   GLUCOSEU NEGATIVE 05/30/2016 1507   HGBUR SMALL* 05/30/2016 1507   BILIRUBINUR NEGATIVE 05/30/2016 1507   KETONESUR NEGATIVE 05/30/2016 1507   PROTEINUR TRACE* 05/30/2016 1507   UROBILINOGEN 1.0 07/24/2014 1605   NITRITE NEGATIVE 05/30/2016 1507   LEUKOCYTESUR NEGATIVE 05/30/2016 1507   Sepsis Labs: @LABRCNTIP (procalcitonin:4,lacticidven:4) ) Recent Results (from the past 240 hour(s))  Blood Culture (routine x 2)     Status: None (Preliminary result)   Collection Time: 05/30/16  2:36 PM  Result Value Ref Range Status   Specimen Description RIGHT ANTECUBITAL  Final   Special Requests BOTTLES DRAWN AEROBIC AND ANAEROBIC 8CC EACH  Final   Culture PENDING  Incomplete   Report Status PENDING  Incomplete  Blood Culture (routine x 2)     Status: None (Preliminary result)   Collection Time: 05/30/16  2:44 PM  Result Value Ref Range Status   Specimen Description RIGHT ANTECUBITAL  Final   Special Requests BOTTLES DRAWN AEROBIC AND ANAEROBIC [facility]6CC EACH  Final   Culture PENDING  Incomplete   Report Status PENDING  Incomplete      Radiological Exams on  Admission: Ct Abdomen Pelvis W Contrast  05/30/2016  CLINICAL DATA:  Abdominal pain for 3 days. EXAM: CT ABDOMEN AND PELVIS WITH CONTRAST TECHNIQUE: Multidetector CT imaging of the abdomen and pelvis was performed using the standard protocol following bolus administration of intravenous contrast. CONTRAST:  100mL ISOVUE-300 IOPAMIDOL (ISOVUE-300) INJECTION 61% COMPARISON:  CT 02/03/2012 FINDINGS: Lower chest: Branching nodular pattern in the LEFT and RIGHT lower lobes but much more dense in the RIGHT lower lobe. Hepatobiliary: Intrahepatic and extrahepatic duct dilatation. The common bile duct measures 10 mm. This is new from comparison exam 2013. The gallbladder is normal diameter at 3.8 cm without evidence of inflammation. Pancreas: There is no pancreatic duct dilatation. No lesion in the pancreatic head identified by CT. Spleen: Normal spleen Adrenals/urinary tract: Adrenal glands and kidneys are normal. The ureters and bladder normal. Stomach/Bowel: Stomach, small bowel, appendix, and cecum are normal. The colon and rectosigmoid colon are normal. Vascular/Lymphatic: Abdominal aorta is normal caliber. There is no retroperitoneal or periportal lymphadenopathy. No pelvic lymphadenopathy. Reproductive: Uterus normal. Other: No free fluid. Musculoskeletal: No aggressive osseous lesion. IMPRESSION: 1. Bilateral nodular airspace disease consists with pulmonary infection. Infection most dense in the RIGHT lower lobe. Hilar adenopathy related to the pulmonary infection. Consider atypical infections. 2. Mild intra and extrahepatic biliary duct dilatation to the level of the distal common bile duct. Cannot exclude a distal obstructing lesion. No pancreatic duct dilatation. Recommend correlation with bilirubin. Electronically Signed   By: Genevive BiStewart  Edmunds M.D.   On: 05/30/2016 17:57   Dg Chest Port 1 View  05/30/2016  CLINICAL DATA:  Code sepsis, cough, productive cough in triage, history asthma, hypertension, diabetes  mellitus, ARDS, COPD, smoker, GERD EXAM: PORTABLE CHEST 1 VIEW COMPARISON:  Portable exam 1428 hours compared to 10/15/2015 FINDINGS: Normal heart size, mediastinal contours, and pulmonary vascularity. Chronic versus recurrent infiltrate at RIGHT lower lobe. Minimal chronic accentuation of LEFT basilar markings as well. Slight prominent hila stable. Upper lungs clear. No pleural effusion or pneumothorax. IMPRESSION: Persistent versus recurrent RIGHT lower lobe infiltrate. Electronically Signed   By: Ulyses SouthwardMark  Boles M.D.   On: 05/30/2016 14:48  EKG: Independently reviewed.   Active Problems:   CAP (community acquired pneumonia)   Pneumonia   Assessment/Plan 1. Acute on chronic respiratory failure 2. COPD with exacerbation 3. SIRS 4. Pneumonia 5. Hypokalemia 6. Gastroenteritis but resolved significantly   Admit patient to telemetry floor  Follow cultures  IV antibiotics  Hydrate patient  Monitor and correct electrolytes  Accuchecks and SSI   Steroids  Nebs pulmicort and duoneb  Counseled to quit cigarette use  DVT prophylaxis: [place] lovenox Code Status: Full Family Communication: Husband and Daughter Disposition Plan: Home eventually   Consults called: None   Admission status: inpatient    Time spent: 52 minutes  Berton Mount, MD  [facility] Pager #: [phone] 7PM-7AM contact night coverage as above   05/30/2016, 7:23 PM

## 2016-05-30 NOTE — ED Notes (Signed)
Pt comes in for abdominal pain starting 3 days ago. Pt has been unable to keep down fluids.

## 2016-05-30 NOTE — ED Notes (Signed)
Went in to update vitals on pt and she stated "I'm ready to go. I've been here all day and haven't heard nothing. I'll just go see my doctor Monday for antibiotics."

## 2016-05-30 NOTE — ED Notes (Signed)
In and out cath performed by kiara slade, nt and Georgeanna Radziewicz, rn

## 2016-05-30 NOTE — ED Notes (Signed)
Pt has productive cough noted in triage. Pt is on 3 L oxygen at home

## 2016-05-30 NOTE — ED Notes (Signed)
Pt transported to CT with primary nurse and transporter. Pt in NAD at this time

## 2016-05-30 NOTE — Progress Notes (Signed)
Pt requested Oxycodone immediate release 10mg  every 4 hours. Verbal order given by Dr. Berton MountSylvester Ogbata to add Oxycodone 10 mg immediate release PRN every 4 hours.

## 2016-05-30 NOTE — ED Notes (Signed)
Dr. Bebe ShaggyWickline informed that pt IV in RAC has infiltrated.  Ice given to pt.

## 2016-05-30 NOTE — Progress Notes (Signed)
Pharmacy Antibiotic Note  [name] is a 50 y.o. female admitted on 05/30/2016 with pneumonia.  Pharmacy has been consulted for Vancomycin & Azactam dosing.  Plan: Vancomycin 1 GM and Azactam 2 GM IV given in ED Vancomycin 750 mg IV every 12 hours Azactam 1 GM IV every 8 hours Vancomycin trough at steady state Labs per protocol  Height: 5' (152.4 cm) Weight: 140 lb (63.504 kg) IBW/kg (Calculated) : 45.5  Temp (24hrs), Avg:100.9 F (38.3 C), Min:100.9 F (38.3 C), Max:100.9 F (38.3 C)   Recent Labs Lab 05/30/16 1425 05/30/16 1431  WBC  --  17.9*  CREATININE  --  0.48  LATICACIDVEN 0.51  --     Estimated Creatinine Clearance: 72.3 mL/min (by C-G formula based on Cr of 0.48).    Allergies  Allergen Reactions  . Penicillins Other (See Comments)    convulsions  . Levaquin [Levofloxacin In D5w] Itching and Swelling  . Morphine Nausea Only    Antimicrobials this admission: Vancomycin 7/8 >>  Azactam 7/8 >>   Dose adjustments this admission: None   Thank you for allowing pharmacy to be a part of this patient's care.  Josephine Igoittman, Edith Lord Bennett 05/30/2016 4:08 PM

## 2016-05-30 NOTE — ED Notes (Signed)
Vancomycin drip completed.

## 2016-05-30 NOTE — ED Notes (Signed)
Pt wearing 3L 02NC.

## 2016-05-30 NOTE — ED Notes (Signed)
CRITICAL VALUE ALERT  Critical value received:  CO2 75  Date of notification:  05/30/16  Time of notification:  1524  Critical value read back:Yes.    Nurse who received alert:  Fransico HimMeagan Hafsa Lohn, RN  MD notified (1st page):  Dr. Bebe ShaggyWickline  Time of first page:  1525  MD notified (2nd page):  Time of second page:  Responding MD:  Dr. Bebe ShaggyWickline  Time MD responded:  [phone]

## 2016-05-30 NOTE — ED Provider Notes (Signed)
CSN: [id number]     Arrival date & time 05/30/16  1346 History   First MD Initiated Contact with Patient 05/30/16 1401     Chief Complaint  Patient presents with  . Abdominal Pain    Patient is a 50 y.o. female presenting with fever. The history is provided by the patient, the spouse and a relative.  Fever Severity:  Severe Onset quality:  Gradual Duration:  4 days Timing:  Constant Progression:  Worsening Chronicity:  New Relieved by:  Nothing Worsened by:  Nothing tried Associated symptoms: chills, confusion, cough, diarrhea, dysuria, headaches, myalgias, nausea, somnolence and vomiting   Associated symptoms: no rash   Cough:    Cough characteristics:  Productive   Sputum characteristics:  Green   Severity:  Moderate   Onset quality:  Gradual   Timing:  Intermittent   Progression:  Worsening   Chronicity:  New Myalgias:    Location:  Generalized   Quality:  Aching   Severity:  Moderate   Onset quality:  Gradual   Timing:  Intermittent   Progression:  Worsening Risk factors: no recent travel   Patient with significant h/o COPD (on 3LNC at home) presents with fatigue for 5 days and ongoing fever for 4 days She reports chills/myalgias/HA/cough/vomiting/diarrhea/abdominal pain No travel No rash No tick bites Husband reports confusion/somnolence earlier today   Past Medical History  Diagnosis Date  . Migraine headache   . On home O2   . COPD (chronic obstructive pulmonary disease) (HCC)   . Chronic back pain   . Hyperglycemia, drug-induced     steroid induced hyperglycemia  . ARDS (adult respiratory distress syndrome) [facility])     Jan 2011  . Diabetes mellitus   . Pneumonia   . Angina   . Asthma   . Shortness of breath   . Recurrent upper respiratory infection (URI)   . GERD (gastroesophageal reflux disease) 12/21/2012  . Anxiety state 10/15/2015  . HTN (hypertension) 10/15/2015   Past Surgical History  Procedure Laterality Date  . Tubal ligation    . Uterine  ablasion    . C-section    . Tracheostomy      decannulated 12/2009   Family History  Problem Relation Age of Onset  . Coronary artery disease Brother   . Diabetes Other   . Cancer Other   . Hypertension Other    Social History  Substance Use Topics  . Smoking status: Current Every Day Smoker -- 0.25 packs/day for 33 years    Types: Cigarettes  . Smokeless tobacco: None     Comment: 1/2 pack a day  since age 57  . Alcohol Use: No   OB History    No data available     Review of Systems  Constitutional: Positive for fever and chills.  Respiratory: Positive for cough and shortness of breath.   Gastrointestinal: Positive for nausea, vomiting and diarrhea.  Genitourinary: Positive for dysuria.  Musculoskeletal: Positive for myalgias.  Skin: Negative for rash.  Neurological: Positive for headaches. Negative for seizures.  Psychiatric/Behavioral: Positive for confusion.  All other systems reviewed and are negative.     Allergies  Penicillins; Levaquin; and Morphine  Home Medications   Prior to Admission medications   Medication Sig Start Date End Date Taking? Authorizing Provider  acetaminophen (TYLENOL) 500 MG tablet Take 500 mg by mouth every 6 (six) hours as needed for fever.     Historical Provider, MD  albuterol (PROAIR HFA) 108 (90 BASE) MCG/ACT inhaler  Inhale 2 puffs into the lungs every 6 (six) hours as needed. For shortness of breATH    Historical Provider, MD  albuterol (PROVENTIL) (2.5 MG/3ML) 0.083% nebulizer solution Take 2.5 mg by nebulization every 4 (four) hours as needed. For shortness of breath  03/19/11   Clare S Parrett, NP  budesonide-formoterol (SYMBICORT) 160-4.5 MCG/ACT inhaler Inhale 2 puffs into the lungs 2 (two) times daily.    Historical Provider, MD  clonazePAM (KLONOPIN) 0.5 MG tablet Take 0.5 mg by mouth 3 (three) times daily as needed for anxiety.    Historical Provider, MD  doxycycline (VIBRAMYCIN) 50 MG capsule Take 2 capsules (100 mg total)  by mouth 2 (two) times daily. 10/19/15   Kari Baars, MD  fluconazole (DIFLUCAN) 150 MG tablet Take 1 tablet (150 mg total) by mouth daily. 10/19/15   Kari Baars, MD  lisinopril (PRINIVIL,ZESTRIL) 20 MG tablet Take 20 mg by mouth daily.    Historical Provider, MD  OxyCODONE (OXYCONTIN) 10 mg T12A Take 1 tablet (10 mg total) by mouth every 12 (twelve) hours. 04/24/13   Kari Baars, MD  Oxymetazoline HCl (AFRIN NASAL SPRAY NA) Place 1 spray into the nose daily as needed (Congestion).     Historical Provider, MD  pantoprazole (PROTONIX) 40 MG tablet Take 40 mg by mouth daily.    Historical Provider, MD  predniSONE (DELTASONE) 10 MG tablet 4x3 d,3x3d,2x3d,1x3d 10/19/15   Kari Baars, MD  pregabalin (LYRICA) 50 MG capsule Take 50 mg by mouth every 8 (eight) hours.     Historical Provider, MD  promethazine (PHENERGAN) 25 MG tablet Take 25 mg by mouth every 6 (six) hours as needed for nausea.     Historical Provider, MD  RESTASIS 0.05 % ophthalmic emulsion Place 1 drop into both eyes 2 (two) times daily. 09/28/15   Historical Provider, MD  tiotropium (SPIRIVA) 18 MCG inhalation capsule Place 18 mcg into inhaler and inhale daily.    Historical Provider, MD   BP 105/56 mmHg  Pulse 120  Temp(Src) 100.9 F (38.3 C) (Oral)  Resp 18  Ht 5' (1.524 m)  Wt 63.504 kg  BMI 27.34 kg/m2  SpO2 86% Physical Exam CONSTITUTIONAL: Chronically ill appearing, somnolent HEAD: Normocephalic/atraumatic EYES: EOMI/PERRL ENMT: Mucous membranes dry, thrush noted, uvula midline NECK: supple no meningeal signs SPINE/BACK:entire spine nontender CV: S1/S2 noted,  tachycardic LUNGS: coarse wheezing noted bilaterally, tachypnea noted ABDOMEN: soft, nontender, no rebound or guarding, bowel sounds noted throughout abdomen GU:no cva tenderness NEURO: Pt is somnolent but arousable, answers questions appropriately, moves all extremitiesx4.  No facial droop.   EXTREMITIES: pulses normal/equal, full ROM SKIN: warm,  color normal, no rash PSYCH: no abnormalities of mood noted, alert and oriented to situation  ED Course  Procedures  CRITICAL CARE Performed by: Joya Gaskins Total critical care time: 40 minutes Critical care time was exclusive of separately billable procedures and treating other patients. Critical care was necessary to treat or prevent imminent or life-threatening deterioration. Critical care was time spent personally by me on the following activities: development of treatment plan with patient and/or surrogate as well as nursing, discussions with consultants, evaluation of patient's response to treatment, examination of patient, obtaining history from patient or surrogate, ordering and performing treatments and interventions, ordering and review of laboratory studies, ordering and review of radiographic studies, pulse oximetry and re-evaluation of patient's condition. PATIENT WITH COPD AND PNEUMONIA REQUIRING IV FLUIDS, IV ANTIBIOTICS AND NEBULIZER TREATMENTS   2:27 PM Pt seen on arrival to room Code sepsis called  Will follow closely 3:43 PM Pt improving BP 117/67 mmHg  Pulse 110  Temp(Src) 100.9 F (38.3 C) (Oral)  Resp 23  Ht 5' (1.524 m)  Wt 63.504 kg  BMI 27.34 kg/m2  SpO2 93% She is awake/alert She has hypercapnea but no mental status changes Still with wheeze will give another nebulizer She reports continued ABD pain She has diffuse abd tenderness that is new Will order CT imaging  Repeat sepsis assessment completed.  6:31 PM Pt clinically improved She is awake/alert BP 106/65 mmHg  Pulse 109  Temp(Src) 100.9 F (38.3 C) (Oral)  Resp 20  Ht 5' (1.524 m)  Wt 63.504 kg  BMI 27.34 kg/m2  SpO2 93% She reports having anxiety and is due for a klonopin She would like to eat CT abd/pelvis negative for acute process, but she does have significant pneumonia Will need admission due to hypoxia (initial pulse ox 86% on 3LNC) sepsis D/w dr Dartha Lodgeogbata with [facility], will  admit Patient is managed primarily by dr Juanetta Goslinghawkins as PCP, admitted to his service   Labs Review Labs Reviewed  COMPREHENSIVE METABOLIC PANEL - Abnormal; Notable for the following:    Sodium 134 (*)    Potassium 3.2 (*)    Chloride 91 (*)    CO2 37 (*)    Glucose, Bld 170 (*)    Calcium 8.7 (*)    ALT 10 (*)    All other components within normal limits  CBC - Abnormal; Notable for the following:    WBC 17.9 (*)    All other components within normal limits  URINALYSIS, ROUTINE W REFLEX MICROSCOPIC (NOT [facility]) - Abnormal; Notable for the following:    Hgb urine dipstick SMALL (*)    Protein, ur TRACE (*)    All other components within normal limits  BLOOD GAS, ARTERIAL - Abnormal; Notable for the following:    pH, Arterial 7.285 (*)    pCO2 arterial 75.0 (*)    pO2, Arterial 62.0 (*)    Bicarbonate 29.8 (*)    Acid-Base Excess 8.0 (*)    All other components within normal limits  URINE MICROSCOPIC-ADD ON - Abnormal; Notable for the following:    Squamous Epithelial / LPF 0-5 (*)    Bacteria, UA FEW (*)    All other components within normal limits  CBG MONITORING, ED - Abnormal; Notable for the following:    Glucose-Capillary 175 (*)    All other components within normal limits  CULTURE, BLOOD (ROUTINE X 2)  CULTURE, BLOOD (ROUTINE X 2)  URINE CULTURE  LIPASE, BLOOD  HIV ANTIBODY (ROUTINE TESTING)  BASIC METABOLIC PANEL  I-STAT CG4 LACTIC ACID, ED  I-STAT BETA HCG BLOOD, ED ([facility], [facility], [facility] ONLY)  I-STAT CG4 LACTIC ACID, ED    Imaging Review Ct Abdomen Pelvis W Contrast  05/30/2016  CLINICAL DATA:  Abdominal pain for 3 days. EXAM: CT ABDOMEN AND PELVIS WITH CONTRAST TECHNIQUE: Multidetector CT imaging of the abdomen and pelvis was performed using the standard protocol following bolus administration of intravenous contrast. CONTRAST:  100mL ISOVUE-300 IOPAMIDOL (ISOVUE-300) INJECTION 61% COMPARISON:  CT 02/03/2012 FINDINGS: Lower chest: Branching nodular pattern in the LEFT and  RIGHT lower lobes but much more dense in the RIGHT lower lobe. Hepatobiliary: Intrahepatic and extrahepatic duct dilatation. The common bile duct measures 10 mm. This is new from comparison exam 2013. The gallbladder is normal diameter at 3.8 cm without evidence of inflammation. Pancreas: There is no pancreatic duct dilatation. No lesion in the  pancreatic head identified by CT. Spleen: Normal spleen Adrenals/urinary tract: Adrenal glands and kidneys are normal. The ureters and bladder normal. Stomach/Bowel: Stomach, small bowel, appendix, and cecum are normal. The colon and rectosigmoid colon are normal. Vascular/Lymphatic: Abdominal aorta is normal caliber. There is no retroperitoneal or periportal lymphadenopathy. No pelvic lymphadenopathy. Reproductive: Uterus normal. Other: No free fluid. Musculoskeletal: No aggressive osseous lesion. IMPRESSION: 1. Bilateral nodular airspace disease consists with pulmonary infection. Infection most dense in the RIGHT lower lobe. Hilar adenopathy related to the pulmonary infection. Consider atypical infections. 2. Mild intra and extrahepatic biliary duct dilatation to the level of the distal common bile duct. Cannot exclude a distal obstructing lesion. No pancreatic duct dilatation. Recommend correlation with bilirubin. Electronically Signed   By: Genevive Bi M.D.   On: 05/30/2016 17:57   Dg Chest Port 1 View  05/30/2016  CLINICAL DATA:  Code sepsis, cough, productive cough in triage, history asthma, hypertension, diabetes mellitus, ARDS, COPD, smoker, GERD EXAM: PORTABLE CHEST 1 VIEW COMPARISON:  Portable exam 1428 hours compared to 10/15/2015 FINDINGS: Normal heart size, mediastinal contours, and pulmonary vascularity. Chronic versus recurrent infiltrate at RIGHT lower lobe. Minimal chronic accentuation of LEFT basilar markings as well. Slight prominent hila stable. Upper lungs clear. No pleural effusion or pneumothorax. IMPRESSION: Persistent versus recurrent RIGHT  lower lobe infiltrate. Electronically Signed   By: Ulyses Southward M.D.   On: 05/30/2016 14:48   I have personally reviewed and evaluated these images and lab results as part of my medical decision-making.   EKG Interpretation   Date/Time:  Saturday May 30 2016 14:07:45 EDT Ventricular Rate:  118 PR Interval:    QRS Duration: 99 QT Interval:  305 QTC Calculation: 428 R Axis:   70 Text Interpretation:  Sinus tachycardia Abnormal R-wave progression, early  transition No significant change since last tracing Confirmed by Bebe Shaggy   MD, Jonny Longino ([id number]) on 05/30/2016 2:22:37 PM     Medications  vancomycin (VANCOCIN) IVPB 750 mg/150 ml premix (not administered)  aztreonam (AZACTAM) 1 g in dextrose 5 % 50 mL IVPB (not administered)  sodium chloride 0.9 % bolus 1,000 mL (0 mLs Intravenous Stopped 05/30/16 1458)  ipratropium-albuterol (DUONEB) 0.5-2.5 (3) MG/3ML nebulizer solution 3 mL (3 mLs Nebulization Given 05/30/16 1445)  sodium chloride 0.9 % bolus 1,000 mL (0 mLs Intravenous Stopped 05/30/16 1647)    And  sodium chloride 0.9 % bolus 1,000 mL (0 mLs Intravenous Stopped 05/30/16 1558)  aztreonam (AZACTAM) 2 g in dextrose 5 % 50 mL IVPB (0 g Intravenous Stopped 05/30/16 1528)  vancomycin (VANCOCIN) IVPB 1000 mg/200 mL premix (0 mg Intravenous Stopped 05/30/16 1647)  ibuprofen (ADVIL,MOTRIN) tablet 800 mg (800 mg Oral Given 05/30/16 1458)  fentaNYL (SUBLIMAZE) injection 50 mcg (50 mcg Intravenous Given 05/30/16 1538)  albuterol (PROVENTIL,VENTOLIN) solution continuous neb (10 mg/hr Nebulization Given 05/30/16 1555)  diatrizoate meglumine-sodium (GASTROGRAFIN) 66-10 % solution (30 mLs Oral Given 05/30/16 1604)  iopamidol (ISOVUE-300) 61 % injection 100 mL (100 mLs Intravenous Contrast Given 05/30/16 1719)  clonazePAM (KLONOPIN) tablet 0.5 mg (0.5 mg Oral Given 05/30/16 1821)     MDM   Final diagnoses:  CAP (community acquired pneumonia)  Sepsis, due to unspecified organism (HCC)  Acute respiratory failure with  hypoxia and hypercapnia (HCC)    Nursing notes including past medical history and social history reviewed and considered in documentation xrays/imaging reviewed by myself and considered during evaluation Labs/vital reviewed myself and considered during evaluation Previous records reviewed and considered     Dorinda Hill  Bebe Shaggy, MD 05/30/16 [phone]

## 2016-05-30 NOTE — ED Notes (Signed)
Pt in room, has taken off continuous nebulizer tx and sats around 65%, pt placed back on mask and sats returned to 95%.  Pt reinformed of purpose to keep mask on face.  MD notified and at bedside.

## 2016-05-30 NOTE — ED Notes (Signed)
Dr. Bebe ShaggyWickline communicated to nurse that pt can go to CT without oral contrast, just IV contrast.

## 2016-05-31 LAB — BASIC METABOLIC PANEL
Anion gap: 3 — ABNORMAL LOW (ref 5–15)
BUN: 7 mg/dL (ref 6–20)
CO2: 33 mmol/L — ABNORMAL HIGH (ref 22–32)
Calcium: 8.4 mg/dL — ABNORMAL LOW (ref 8.9–10.3)
Chloride: 100 mmol/L — ABNORMAL LOW (ref 101–111)
Creatinine, Ser: 0.31 mg/dL — ABNORMAL LOW (ref 0.44–1.00)
GFR calc Af Amer: >60 mL/min (ref >60)
GFR calc non Af Amer: >60 mL/min (ref >60)
Glucose, Bld: 249 mg/dL — ABNORMAL HIGH (ref 65–99)
Potassium: 4.1 mmol/L (ref 3.5–5.1)
Sodium: 136 mmol/L (ref 135–145)

## 2016-05-31 LAB — GLUCOSE, CAPILLARY
Glucose-Capillary: 234 mg/dL — ABNORMAL HIGH (ref 65–99)
Glucose-Capillary: 208 mg/dL — ABNORMAL HIGH (ref 65–99)
Glucose-Capillary: 206 mg/dL — ABNORMAL HIGH (ref 65–99)
Glucose-Capillary: 233 mg/dL — ABNORMAL HIGH (ref 65–99)
Glucose-Capillary: 276 mg/dL — ABNORMAL HIGH (ref 65–99)
Glucose-Capillary: 236 mg/dL — ABNORMAL HIGH (ref 65–99)

## 2016-05-31 LAB — HIV ANTIBODY (ROUTINE TESTING W REFLEX): HIV Screen 4th Generation wRfx: NONREACTIVE

## 2016-05-31 MED ORDER — ACETAMINOPHEN 500 MG PO TABS
500.0000 mg | ORAL_TABLET | ORAL | Status: DC | PRN
  Administered 2016-05-31 – 2016-06-01 (×2): 500 mg via ORAL
  Filled 2016-05-31 (×2): qty 1

## 2016-05-31 NOTE — Progress Notes (Signed)
Subjective: She was admitted with pneumonia and systemic inflammatory response syndrome. She says she feels much better. She has acute on chronic hypoxic respiratory failure with COPD exacerbation as well  Objective: Vital signs in last 24 hours: Temp:  [98.2 F (36.8 C)-100.9 F (38.3 C)] 98.2 F (36.8 C) (07/09 0445) Pulse Rate:  [68-120] 92 (07/09 0445) Resp:  [15-29] 20 (07/09 0445) BP: (87-125)/(56-76) 107/70 mmHg (07/09 0445) SpO2:  [86 %-96 %] 95 % (07/09 0737) FiO2 (%):  [0 %] 0 % (07/08 1901) Weight:  [59.875 kg (132 lb)-63.504 kg (140 lb)] 59.875 kg (132 lb) (07/08 1907) Weight change:  Last BM Date: 05/29/16  Intake/Output from previous day: 07/08 0701 - 07/09 0700 In: -  Out: 1300 [Urine:1300]  PHYSICAL EXAM General appearance: alert, cooperative and no distress Resp: rhonchi bilaterally Cardio: regular rate and rhythm, S1, S2 normal, no murmur, click, rub or gallop GI: soft, non-tender; bowel sounds normal; no masses,  no organomegaly Extremities: extremities normal, atraumatic, no cyanosis or edema  Lab Results:  Results for orders placed or performed during the hospital encounter of 05/30/16 (from the past 48 hour(s))  POC CBG, ED     Status: Abnormal   Collection Time: 05/30/16  1:58 PM  Result Value Ref Range   Glucose-Capillary 175 (H) 65 - 99 mg/dL  I-Stat CG4 Lactic Acid, ED  (not at  [facility])     Status: None   Collection Time: 05/30/16  2:25 PM  Result Value Ref Range   Lactic Acid, Venous 0.51 0.5 - 1.9 mmol/L  I-Stat Beta hCG blood, ED ([facility], [facility], [facility] only)     Status: None   Collection Time: 05/30/16  2:26 PM  Result Value Ref Range   I-stat hCG, quantitative <5.0 <5 mIU/mL   Comment 3            Comment:   GEST. AGE      CONC.  (mIU/mL)   <=1 WEEK        5 - 50     2 WEEKS       50 - 500     3 WEEKS       100 - 10,000     4 WEEKS     1,000 - 30,000        FEMALE AND NON-PREGNANT FEMALE:     LESS THAN 5 mIU/mL   Lipase, blood     Status: None    Collection Time: 05/30/16  2:31 PM  Result Value Ref Range   Lipase 12 11 - 51 U/L  Comprehensive metabolic panel     Status: Abnormal   Collection Time: 05/30/16  2:31 PM  Result Value Ref Range   Sodium 134 (L) 135 - 145 mmol/L   Potassium 3.2 (L) 3.5 - 5.1 mmol/L   Chloride 91 (L) 101 - 111 mmol/L   CO2 37 (H) 22 - 32 mmol/L   Glucose, Bld 170 (H) 65 - 99 mg/dL   BUN 11 6 - 20 mg/dL   Creatinine, Ser 0.48 0.44 - 1.00 mg/dL   Calcium 8.7 (L) 8.9 - 10.3 mg/dL   Total Protein 7.3 6.5 - 8.1 g/dL   Albumin 3.6 3.5 - 5.0 g/dL   AST 15 15 - 41 U/L   ALT 10 (L) 14 - 54 U/L   Alkaline Phosphatase 68 38 - 126 U/L   Total Bilirubin 0.5 0.3 - 1.2 mg/dL   GFR calc non Af Amer >60 >60 mL/min   GFR  calc Af Amer >60 >60 mL/min    Comment: (NOTE) The eGFR has been calculated using the CKD EPI equation. This calculation has not been validated in all clinical situations. eGFR's persistently <60 mL/min signify possible Chronic Kidney Disease.    Anion gap 6 5 - 15  CBC     Status: Abnormal   Collection Time: 05/30/16  2:31 PM  Result Value Ref Range   WBC 17.9 (H) 4.0 - 10.5 K/uL   RBC 4.81 3.87 - 5.11 MIL/uL   Hemoglobin 13.3 12.0 - 15.0 g/dL   HCT 41.7 36.0 - 46.0 %   MCV 86.7 78.0 - 100.0 fL   MCH 27.7 26.0 - 34.0 pg   MCHC 31.9 30.0 - 36.0 g/dL   RDW 12.5 11.5 - 15.5 %   Platelets 289 150 - 400 K/uL  Blood Culture (routine x 2)     Status: None (Preliminary result)   Collection Time: 05/30/16  2:36 PM  Result Value Ref Range   Specimen Description RIGHT ANTECUBITAL    Special Requests BOTTLES DRAWN AEROBIC AND ANAEROBIC 8CC EACH    Culture PENDING    Report Status PENDING   HIV antibody     Status: None   Collection Time: 05/30/16  2:37 PM  Result Value Ref Range   HIV Screen 4th Generation wRfx Non Reactive Non Reactive    Comment: (NOTE) Performed At: [facility] [place], [place] [id number] Lindon Romp MD JQ:[id number]   Blood Culture  (routine x 2)     Status: None (Preliminary result)   Collection Time: 05/30/16  2:44 PM  Result Value Ref Range   Specimen Description RIGHT ANTECUBITAL    Special Requests BOTTLES DRAWN AEROBIC AND ANAEROBIC [facility] EACH    Culture PENDING    Report Status PENDING   Blood gas, arterial ([facility], [facility], [facility])     Status: Abnormal   Collection Time: 05/30/16  3:00 PM  Result Value Ref Range   O2 Content 3.0 L/min   Delivery systems NASAL CANNULA    pH, Arterial 7.285 (L) 7.350 - 7.450   pCO2 arterial 75.0 (HH) 35.0 - 45.0 mmHg    Comment: RBV MEAGAN WHITE, RN AT 1323, BW WENDY VIA,RRT,RCP ON 05/30/2016   pO2, Arterial 62.0 (L) 80.0 - 100.0 mmHg   Bicarbonate 29.8 (H) 20.0 - 24.0 mEq/L   TCO2 75.0 0 - 100 mmol/L   Acid-Base Excess 8.0 (H) 0.0 - 2.0 mmol/L   O2 Saturation 89.6 %   Patient temperature 37.0    Collection site RIGHT RADIAL    Drawn by [id number]    Sample type ARTERIAL DRAW    Allens test (pass/fail) PASS PASS  Urinalysis, Routine w reflex microscopic     Status: Abnormal   Collection Time: 05/30/16  3:07 PM  Result Value Ref Range   Color, Urine YELLOW YELLOW   APPearance CLEAR CLEAR   Specific Gravity, Urine 1.025 1.005 - 1.030   pH 6.0 5.0 - 8.0   Glucose, UA NEGATIVE NEGATIVE mg/dL   Hgb urine dipstick SMALL (A) NEGATIVE   Bilirubin Urine NEGATIVE NEGATIVE   Ketones, ur NEGATIVE NEGATIVE mg/dL   Protein, ur TRACE (A) NEGATIVE mg/dL   Nitrite NEGATIVE NEGATIVE   Leukocytes, UA NEGATIVE NEGATIVE  Urine microscopic-add on     Status: Abnormal   Collection Time: 05/30/16  3:07 PM  Result Value Ref Range   Squamous Epithelial / LPF 0-5 (A) NONE SEEN   WBC, UA 0-5 0 -  5 WBC/hpf   RBC / HPF 0-5 0 - 5 RBC/hpf   Bacteria, UA FEW (A) NONE SEEN   Urine-Other MUCOUS PRESENT   I-Stat CG4 Lactic Acid, ED  (not at  [facility])     Status: None   Collection Time: 05/30/16  6:38 PM  Result Value Ref Range   Lactic Acid, Venous 0.70 0.5 - 1.9 mmol/L  Magnesium     Status: Abnormal    Collection Time: 05/30/16  7:53 PM  Result Value Ref Range   Magnesium 1.4 (L) 1.7 - 2.4 mg/dL  Phosphorus     Status: None   Collection Time: 05/30/16  7:53 PM  Result Value Ref Range   Phosphorus 3.0 2.5 - 4.6 mg/dL  Glucose, capillary     Status: Abnormal   Collection Time: 05/30/16  9:10 PM  Result Value Ref Range   Glucose-Capillary 234 (H) 65 - 99 mg/dL  Glucose, capillary     Status: Abnormal   Collection Time: 05/31/16  5:49 AM  Result Value Ref Range   Glucose-Capillary 208 (H) 65 - 99 mg/dL  Basic metabolic panel     Status: Abnormal   Collection Time: 05/31/16  6:10 AM  Result Value Ref Range   Sodium 136 135 - 145 mmol/L   Potassium 4.1 3.5 - 5.1 mmol/L    Comment: DELTA CHECK NOTED   Chloride 100 (L) 101 - 111 mmol/L   CO2 33 (H) 22 - 32 mmol/L   Glucose, Bld 249 (H) 65 - 99 mg/dL   BUN 7 6 - 20 mg/dL   Creatinine, Ser 0.31 (L) 0.44 - 1.00 mg/dL   Calcium 8.4 (L) 8.9 - 10.3 mg/dL   GFR calc non Af Amer >60 >60 mL/min   GFR calc Af Amer >60 >60 mL/min    Comment: (NOTE) The eGFR has been calculated using the CKD EPI equation. This calculation has not been validated in all clinical situations. eGFR's persistently <60 mL/min signify possible Chronic Kidney Disease.    Anion gap 3 (L) 5 - 15    ABGS  Recent Labs  05/30/16 1500  PHART 7.285*  PO2ART 62.0*  TCO2 75.0  HCO3 29.8*   CULTURES Recent Results (from the past 240 hour(s))  Blood Culture (routine x 2)     Status: None (Preliminary result)   Collection Time: 05/30/16  2:36 PM  Result Value Ref Range Status   Specimen Description RIGHT ANTECUBITAL  Final   Special Requests BOTTLES DRAWN AEROBIC AND ANAEROBIC 8CC EACH  Final   Culture PENDING  Incomplete   Report Status PENDING  Incomplete  Blood Culture (routine x 2)     Status: None (Preliminary result)   Collection Time: 05/30/16  2:44 PM  Result Value Ref Range Status   Specimen Description RIGHT ANTECUBITAL  Final   Special Requests  BOTTLES DRAWN AEROBIC AND ANAEROBIC [facility] EACH  Final   Culture PENDING  Incomplete   Report Status PENDING  Incomplete   Studies/Results: Ct Abdomen Pelvis W Contrast  05/30/2016  CLINICAL DATA:  Abdominal pain for 3 days. EXAM: CT ABDOMEN AND PELVIS WITH CONTRAST TECHNIQUE: Multidetector CT imaging of the abdomen and pelvis was performed using the standard protocol following bolus administration of intravenous contrast. CONTRAST:  171m ISOVUE-300 IOPAMIDOL (ISOVUE-300) INJECTION 61% COMPARISON:  CT 02/03/2012 FINDINGS: Lower chest: Branching nodular pattern in the LEFT and RIGHT lower lobes but much more dense in the RIGHT lower lobe. Hepatobiliary: Intrahepatic and extrahepatic duct dilatation. The common bile duct  measures 10 mm. This is new from comparison exam 2013. The gallbladder is normal diameter at 3.8 cm without evidence of inflammation. Pancreas: There is no pancreatic duct dilatation. No lesion in the pancreatic head identified by CT. Spleen: Normal spleen Adrenals/urinary tract: Adrenal glands and kidneys are normal. The ureters and bladder normal. Stomach/Bowel: Stomach, small bowel, appendix, and cecum are normal. The colon and rectosigmoid colon are normal. Vascular/Lymphatic: Abdominal aorta is normal caliber. There is no retroperitoneal or periportal lymphadenopathy. No pelvic lymphadenopathy. Reproductive: Uterus normal. Other: No free fluid. Musculoskeletal: No aggressive osseous lesion. IMPRESSION: 1. Bilateral nodular airspace disease consists with pulmonary infection. Infection most dense in the RIGHT lower lobe. Hilar adenopathy related to the pulmonary infection. Consider atypical infections. 2. Mild intra and extrahepatic biliary duct dilatation to the level of the distal common bile duct. Cannot exclude a distal obstructing lesion. No pancreatic duct dilatation. Recommend correlation with bilirubin. Electronically Signed   By: Suzy Bouchard M.D.   On: 05/30/2016 17:57   Dg Chest  Port 1 View  05/30/2016  CLINICAL DATA:  Code sepsis, cough, productive cough in triage, history asthma, hypertension, diabetes mellitus, ARDS, COPD, smoker, GERD EXAM: PORTABLE CHEST 1 VIEW COMPARISON:  Portable exam 1428 hours compared to 10/15/2015 FINDINGS: Normal heart size, mediastinal contours, and pulmonary vascularity. Chronic versus recurrent infiltrate at RIGHT lower lobe. Minimal chronic accentuation of LEFT basilar markings as well. Slight prominent hila stable. Upper lungs clear. No pleural effusion or pneumothorax. IMPRESSION: Persistent versus recurrent RIGHT lower lobe infiltrate. Electronically Signed   By: Lavonia Dana M.D.   On: 05/30/2016 14:48    Medications:  Prior to Admission:  Prescriptions prior to admission  Medication Sig Dispense Refill Last Dose  . acetaminophen (TYLENOL) 500 MG tablet Take 500 mg by mouth every 6 (six) hours as needed for fever.    05/29/2016 at Unknown time  . albuterol (PROAIR HFA) 108 (90 BASE) MCG/ACT inhaler Inhale 2 puffs into the lungs every 6 (six) hours as needed. For shortness of breATH   05/30/2016 at Unknown time  . albuterol (PROVENTIL) (2.5 MG/3ML) 0.083% nebulizer solution Take 2.5 mg by nebulization every 4 (four) hours as needed. For shortness of breath    unknown  . budesonide-formoterol (SYMBICORT) 160-4.5 MCG/ACT inhaler Inhale 2 puffs into the lungs 2 (two) times daily.   05/30/2016 at Unknown time  . clonazePAM (KLONOPIN) 0.5 MG tablet Take 0.5 mg by mouth 3 (three) times daily.    05/29/2016 at Unknown time  . lisinopril (PRINIVIL,ZESTRIL) 20 MG tablet Take 20 mg by mouth daily.   05/29/2016 at Unknown time  . OxyCODONE (OXYCONTIN) 10 mg T12A Take 1 tablet (10 mg total) by mouth every 12 (twelve) hours. 60 tablet 0 05/29/2016 at Unknown time  . oxyCODONE (ROXICODONE) 15 MG immediate release tablet Take 15 mg by mouth every 4 (four) hours.   05/29/2016 at Unknown time  . Oxymetazoline HCl (AFRIN NASAL SPRAY NA) Place 1 spray into the nose daily  as needed (Congestion).    unknown  . pantoprazole (PROTONIX) 40 MG tablet Take 40 mg by mouth daily.   05/29/2016 at Unknown time  . pregabalin (LYRICA) 50 MG capsule Take 50-100 mg by mouth every 8 (eight) hours. 1 twice daily and two in the evening   05/29/2016 at Unknown time  . promethazine (PHENERGAN) 25 MG tablet Take 25 mg by mouth every 6 (six) hours as needed for nausea.    05/29/2016 at Unknown time  . RESTASIS 0.05 %  ophthalmic emulsion Place 1 drop into both eyes 2 (two) times daily.  3 Past Week at Unknown time  . tiotropium (SPIRIVA) 18 MCG inhalation capsule Place 18 mcg into inhaler and inhale daily.   05/29/2016 at Unknown time   Scheduled: . aztreonam  1 g Intravenous Q8H  . budesonide (PULMICORT) nebulizer solution  0.25 mg Nebulization BID  . cycloSPORINE  1 drop Both Eyes BID  . enoxaparin (LOVENOX) injection  40 mg Subcutaneous Q24H  . insulin aspart  0-5 Units Subcutaneous QHS  . insulin aspart  0-9 Units Subcutaneous TID WC  . ipratropium-albuterol  3 mL Nebulization Q6H  . methylPREDNISolone (SOLU-MEDROL) injection  40 mg Intravenous Q12H  . oxyCODONE  10 mg Oral Q12H  . oxymetazoline  1 spray Each Nare BID  . pantoprazole  40 mg Oral Daily  . pregabalin  50 mg Oral Q8H  . vancomycin  750 mg Intravenous Q12H   Continuous: . 0.9 % NaCl with KCl 20 mEq / L 75 mL/hr at 05/30/16 2005   WXG:[id number], clonazePAM, ipratropium-albuterol, metoCLOPramide (REGLAN) injection, oxyCODONE  Assesment: She has community-acquired pneumonia. She is being treated. 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 She is significantly better today. Active Problems:   CAP (community acquired pneumonia)   Pneumonia    Plan: Continue current treatments.    LOS: 1 day   Ares Cardozo L 05/31/2016, 7:41 AM

## 2016-06-01 LAB — BASIC METABOLIC PANEL
Anion gap: 6 (ref 5–15)
BUN: 7 mg/dL (ref 6–20)
CO2: 34 mmol/L — ABNORMAL HIGH (ref 22–32)
Calcium: 8.6 mg/dL — ABNORMAL LOW (ref 8.9–10.3)
Chloride: 98 mmol/L — ABNORMAL LOW (ref 101–111)
Creatinine, Ser: 0.34 mg/dL — ABNORMAL LOW (ref 0.44–1.00)
GFR calc Af Amer: >60 mL/min (ref >60)
GFR calc non Af Amer: >60 mL/min (ref >60)
Glucose, Bld: 253 mg/dL — ABNORMAL HIGH (ref 65–99)
Potassium: 3.7 mmol/L (ref 3.5–5.1)
Sodium: 138 mmol/L (ref 135–145)

## 2016-06-01 LAB — URINE CULTURE: Culture: NO GROWTH

## 2016-06-01 LAB — GLUCOSE, CAPILLARY: Glucose-Capillary: 196 mg/dL — ABNORMAL HIGH (ref 65–99)

## 2016-06-01 LAB — HEMOGLOBIN A1C
Hgb A1c MFr Bld: 7.4 % — ABNORMAL HIGH (ref 4.8–5.6)
Mean Plasma Glucose: 166 mg/dL

## 2016-06-01 MED ORDER — METHYLPREDNISOLONE 4 MG PO TBPK: ORAL_TABLET | ORAL | Status: DC

## 2016-06-01 MED ORDER — SULFAMETHOXAZOLE-TRIMETHOPRIM 400-80 MG PO TABS: 1.0000 | ORAL_TABLET | Freq: Two times a day (BID) | ORAL | Status: DC

## 2016-06-01 MED ORDER — FLUCONAZOLE 100 MG PO TABS: 100.0000 mg | ORAL_TABLET | Freq: Every day | ORAL | Status: DC

## 2016-06-01 NOTE — Discharge Summary (Signed)
Physician Discharge Summary  Patient ID: [name] MRN: [id number] DOB/AGE: [birth year] 48 y.o. Primary Care Physician:Barnard Sharps L, MD Admit date: 05/30/2016 Discharge date: 06/01/2016    Discharge Diagnoses:   Active Problems:   Low back pain   DM (diabetes mellitus) (HCC)   GERD (gastroesophageal reflux disease)   CAP (community acquired pneumonia)   Acute-on-chronic respiratory failure (HCC)   COPD with acute exacerbation (HCC)   Anxiety state   HTN (hypertension)   Pneumonia     Medication List    TAKE these medications        acetaminophen 500 MG tablet  Commonly known as:  TYLENOL  Take 500 mg by mouth every 6 (six) hours as needed for fever.     AFRIN NASAL SPRAY NA  Place 1 spray into the nose daily as needed (Congestion).     albuterol (2.5 MG/3ML) 0.083% nebulizer solution  Commonly known as:  PROVENTIL  Take 2.5 mg by nebulization every 4 (four) hours as needed. For shortness of breath     PROAIR HFA 108 (90 Base) MCG/ACT inhaler  Generic drug:  albuterol  Inhale 2 puffs into the lungs every 6 (six) hours as needed. For shortness of breATH     budesonide-formoterol 160-4.5 MCG/ACT inhaler  Commonly known as:  SYMBICORT  Inhale 2 puffs into the lungs 2 (two) times daily.     clonazePAM 0.5 MG tablet  Commonly known as:  KLONOPIN  Take 0.5 mg by mouth 3 (three) times daily.     fluconazole 100 MG tablet  Commonly known as:  DIFLUCAN  Take 1 tablet (100 mg total) by mouth daily.     lisinopril 20 MG tablet  Commonly known as:  PRINIVIL,ZESTRIL  Take 20 mg by mouth daily.     methylPREDNISolone 4 MG Tbpk tablet  Commonly known as:  MEDROL  Take by instructions on package     oxyCODONE 15 MG immediate release tablet  Commonly known as:  ROXICODONE  Take 15 mg by mouth every 4 (four) hours.     oxyCODONE 10 mg 12 hr tablet  Commonly known as:  OXYCONTIN  Take 1 tablet (10 mg total) by mouth every 12 (twelve) hours.     pantoprazole 40  MG tablet  Commonly known as:  PROTONIX  Take 40 mg by mouth daily.     pregabalin 50 MG capsule  Commonly known as:  LYRICA  Take 50-100 mg by mouth every 8 (eight) hours. 1 twice daily and two in the evening     promethazine 25 MG tablet  Commonly known as:  PHENERGAN  Take 25 mg by mouth every 6 (six) hours as needed for nausea.     RESTASIS 0.05 % ophthalmic emulsion  Generic drug:  cycloSPORINE  Place 1 drop into both eyes 2 (two) times daily.     sulfamethoxazole-trimethoprim 400-80 MG tablet  Commonly known as:  BACTRIM  Take 1 tablet by mouth 2 (two) times daily.     tiotropium 18 MCG inhalation capsule  Commonly known as:  SPIRIVA  Place 18 mcg into inhaler and inhale daily.        Discharged Condition:Improved    Consults: None  Significant Diagnostic Studies: Ct Abdomen Pelvis W Contrast  05/30/2016  CLINICAL DATA:  Abdominal pain for 3 days. EXAM: CT ABDOMEN AND PELVIS WITH CONTRAST TECHNIQUE: Multidetector CT imaging of the abdomen and pelvis was performed using the standard protocol following bolus administration of intravenous contrast. CONTRAST:  ISOVUE-300  IOPAMIDOL (ISOVUE-300) INJECTION 61% COMPARISON:  CT 02/03/2012 FINDINGS: Lower chest: Branching nodular pattern in the LEFT and RIGHT lower lobes but much more dense in the RIGHT lower lobe. Hepatobiliary: Intrahepatic and extrahepatic duct dilatation. The common bile duct measures 10 mm. This is new from comparison exam 2013. The gallbladder is normal diameter at 3.8 cm without evidence of inflammation. Pancreas: There is no pancreatic duct dilatation. No lesion in the pancreatic head identified by CT. Spleen: Normal spleen Adrenals/urinary tract: Adrenal glands and kidneys are normal. The ureters and bladder normal. Stomach/Bowel: Stomach, small bowel, appendix, and cecum are normal. The colon and rectosigmoid colon are normal. Vascular/Lymphatic: Abdominal aorta is normal caliber. There is no  retroperitoneal or periportal lymphadenopathy. No pelvic lymphadenopathy. Reproductive: Uterus normal. Other: No free fluid. Musculoskeletal: No aggressive osseous lesion. IMPRESSION: 1. Bilateral nodular airspace disease consists with pulmonary infection. Infection most dense in the RIGHT lower lobe. Hilar adenopathy related to the pulmonary infection. Consider atypical infections. 2. Mild intra and extrahepatic biliary duct dilatation to the level of the distal common bile duct. Cannot exclude a distal obstructing lesion. No pancreatic duct dilatation. Recommend correlation with bilirubin. Electronically Signed   By: Genevive Bi M.D.   On: 05/30/2016 17:57   Dg Chest Port 1 View  05/30/2016  CLINICAL DATA:  Code sepsis, cough, productive cough in triage, history asthma, hypertension, diabetes mellitus, ARDS, COPD, smoker, GERD EXAM: PORTABLE CHEST 1 VIEW COMPARISON:  Portable exam 1428 hours compared to 10/15/2015 FINDINGS: Normal heart size, mediastinal contours, and pulmonary vascularity. Chronic versus recurrent infiltrate at RIGHT lower lobe. Minimal chronic accentuation of LEFT basilar markings as well. Slight prominent hila stable. Upper lungs clear. No pleural effusion or pneumothorax. IMPRESSION: Persistent versus recurrent RIGHT lower lobe infiltrate. Electronically Signed   By: Ulyses Southward M.D.   On: 05/30/2016 14:48    Lab Results: Basic Metabolic Panel:  Recent Labs  96/04/54 1953 05/31/16 0610 06/01/16 0514  NA  --  136 138  K  --  4.1 3.7  CL  --  100* 98*  CO2  --  33* 34*  GLUCOSE  --  249* 253*  BUN  --  7 7  CREATININE  --  0.31* 0.34*  CALCIUM  --  8.4* 8.6*  MG 1.4*  --   --   PHOS 3.0  --   --    Liver Function Tests:  Recent Labs  05/30/16 1431  AST 15  ALT 10*  ALKPHOS 68  BILITOT 0.5  PROT 7.3  ALBUMIN 3.6     CBC:  Recent Labs  05/30/16 1431  WBC 17.9*  HGB 13.3  HCT 41.7  MCV 86.7  PLT 289    Recent Results (from the past 240 hour(s))   Blood Culture (routine x 2)     Status: None (Preliminary result)   Collection Time: 05/30/16  2:36 PM  Result Value Ref Range Status   Specimen Description RIGHT ANTECUBITAL  Final   Special Requests BOTTLES DRAWN AEROBIC AND ANAEROBIC 8CC EACH  Final   Culture NO GROWTH < 24 HOURS  Final   Report Status PENDING  Incomplete  Blood Culture (routine x 2)     Status: None (Preliminary result)   Collection Time: 05/30/16  2:44 PM  Result Value Ref Range Status   Specimen Description RIGHT ANTECUBITAL  Final   Special Requests BOTTLES DRAWN AEROBIC AND ANAEROBIC 6CC EACH  Final   Culture NO GROWTH < 24 HOURS  Final  Report Status PENDING  Incomplete     Hospital Course: This is a 50 year old with a long known history of severe COPD and some element of pulmonary fibrosis related to a previous episode of severe pneumonia requiring ventilator support and development of ARDS. She came to the emergency department because of cough and congestion. She was found to have community-acquired pneumonia. She was treated with antibiotics and improved rapidly. Blood cultures thus far are negative. She feels close to baseline and wants to go home.  Discharge Exam: Blood pressure 130/72, pulse 84, temperature 98.8 F (37.1 C), temperature source Oral, resp. rate 20, height 5' (1.524 m), weight 59.875 kg (132 lb), SpO2 95 %. She is awake and alert. She has minimal wheeze. This is chronic.  Disposition: Home she does not want home health services. She will come to my office tomorrow      Discharge Instructions    Discharge patient    Complete by:  As directed              Signed: Cashis Rill L   06/01/2016, 8:57 AM

## 2016-06-01 NOTE — Progress Notes (Signed)
Subjective: She says she feels much better and wants to go home. She has no other new complaints.  Objective: Vital signs in last 24 hours: Temp:  [98.1 F (36.7 C)-98.9 F (37.2 C)] 98.8 F (37.1 C) (07/10 0500) Pulse Rate:  [72-87] 84 (07/10 0500) Resp:  [16-20] 20 (07/10 0500) BP: (130-176)/(72-85) 130/72 mmHg (07/10 0500) SpO2:  [95 %-98 %] 95 % (07/10 0729) Weight change:  Last BM Date: 05/29/16  Intake/Output from previous day: 07/09 0701 - 07/10 0700 In: 3185 [P.O.:1200; I.V.:1635; IV Piggyback:350] Out: -   PHYSICAL EXAM General appearance: alert, cooperative and no distress Resp: wheezes bilaterally Cardio: regular rate and rhythm, S1, S2 normal, no murmur, click, rub or gallop GI: soft, non-tender; bowel sounds normal; no masses,  no organomegaly Extremities: extremities normal, atraumatic, no cyanosis or edema  Lab Results:  Results for orders placed or performed during the hospital encounter of 05/30/16 (from the past 48 hour(s))  POC CBG, ED     Status: Abnormal   Collection Time: 05/30/16  1:58 PM  Result Value Ref Range   Glucose-Capillary 175 (H) 65 - 99 mg/dL  I-Stat CG4 Lactic Acid, ED  (not at  [facility])     Status: None   Collection Time: 05/30/16  2:25 PM  Result Value Ref Range   Lactic Acid, Venous 0.51 0.5 - 1.9 mmol/L  I-Stat Beta hCG blood, ED ([facility], [facility], [facility] only)     Status: None   Collection Time: 05/30/16  2:26 PM  Result Value Ref Range   I-stat hCG, quantitative <5.0 <5 mIU/mL   Comment 3            Comment:   GEST. AGE      CONC.  (mIU/mL)   <=1 WEEK        5 - 50     2 WEEKS       50 - 500     3 WEEKS       100 - 10,000     4 WEEKS     1,000 - 30,000        FEMALE AND NON-PREGNANT FEMALE:     LESS THAN 5 mIU/mL   Lipase, blood     Status: None   Collection Time: 05/30/16  2:31 PM  Result Value Ref Range   Lipase 12 11 - 51 U/L  Comprehensive metabolic panel     Status: Abnormal   Collection Time: 05/30/16  2:31 PM  Result Value Ref  Range   Sodium 134 (L) 135 - 145 mmol/L   Potassium 3.2 (L) 3.5 - 5.1 mmol/L   Chloride 91 (L) 101 - 111 mmol/L   CO2 37 (H) 22 - 32 mmol/L   Glucose, Bld 170 (H) 65 - 99 mg/dL   BUN 11 6 - 20 mg/dL   Creatinine, Ser 0.48 0.44 - 1.00 mg/dL   Calcium 8.7 (L) 8.9 - 10.3 mg/dL   Total Protein 7.3 6.5 - 8.1 g/dL   Albumin 3.6 3.5 - 5.0 g/dL   AST 15 15 - 41 U/L   ALT 10 (L) 14 - 54 U/L   Alkaline Phosphatase 68 38 - 126 U/L   Total Bilirubin 0.5 0.3 - 1.2 mg/dL   GFR calc non Af Amer >60 >60 mL/min   GFR calc Af Amer >60 >60 mL/min    Comment: (NOTE) The eGFR has been calculated using the CKD EPI equation. This calculation has not been validated in all clinical situations. eGFR's persistently <  60 mL/min signify possible Chronic Kidney Disease.    Anion gap 6 5 - 15  CBC     Status: Abnormal   Collection Time: 05/30/16  2:31 PM  Result Value Ref Range   WBC 17.9 (H) 4.0 - 10.5 K/uL   RBC 4.81 3.87 - 5.11 MIL/uL   Hemoglobin 13.3 12.0 - 15.0 g/dL   HCT 41.7 36.0 - 46.0 %   MCV 86.7 78.0 - 100.0 fL   MCH 27.7 26.0 - 34.0 pg   MCHC 31.9 30.0 - 36.0 g/dL   RDW 12.5 11.5 - 15.5 %   Platelets 289 150 - 400 K/uL  Blood Culture (routine x 2)     Status: None (Preliminary result)   Collection Time: 05/30/16  2:36 PM  Result Value Ref Range   Specimen Description RIGHT ANTECUBITAL    Special Requests BOTTLES DRAWN AEROBIC AND ANAEROBIC 8CC EACH    Culture NO GROWTH < 24 HOURS    Report Status PENDING   HIV antibody     Status: None   Collection Time: 05/30/16  2:37 PM  Result Value Ref Range   HIV Screen 4th Generation wRfx Non Reactive Non Reactive    Comment: (NOTE) Performed At: [facility] [place], [place] [id number] Lindon Romp MD OM:[id number]   Blood Culture (routine x 2)     Status: None (Preliminary result)   Collection Time: 05/30/16  2:44 PM  Result Value Ref Range   Specimen Description RIGHT ANTECUBITAL    Special Requests BOTTLES  DRAWN AEROBIC AND ANAEROBIC 6CC EACH    Culture NO GROWTH < 24 HOURS    Report Status PENDING   Blood gas, arterial ([facility], [facility], [facility])     Status: Abnormal   Collection Time: 05/30/16  3:00 PM  Result Value Ref Range   O2 Content 3.0 L/min   Delivery systems NASAL CANNULA    pH, Arterial 7.285 (L) 7.350 - 7.450   pCO2 arterial 75.0 (HH) 35.0 - 45.0 mmHg    Comment: RBV MEAGAN WHITE, RN AT 1323, BW WENDY VIA,RRT,RCP ON 05/30/2016   pO2, Arterial 62.0 (L) 80.0 - 100.0 mmHg   Bicarbonate 29.8 (H) 20.0 - 24.0 mEq/L   TCO2 75.0 0 - 100 mmol/L   Acid-Base Excess 8.0 (H) 0.0 - 2.0 mmol/L   O2 Saturation 89.6 %   Patient temperature 37.0    Collection site RIGHT RADIAL    Drawn by [id number]    Sample type ARTERIAL DRAW    Allens test (pass/fail) PASS PASS  Urinalysis, Routine w reflex microscopic     Status: Abnormal   Collection Time: 05/30/16  3:07 PM  Result Value Ref Range   Color, Urine YELLOW YELLOW   APPearance CLEAR CLEAR   Specific Gravity, Urine 1.025 1.005 - 1.030   pH 6.0 5.0 - 8.0   Glucose, UA NEGATIVE NEGATIVE mg/dL   Hgb urine dipstick SMALL (A) NEGATIVE   Bilirubin Urine NEGATIVE NEGATIVE   Ketones, ur NEGATIVE NEGATIVE mg/dL   Protein, ur TRACE (A) NEGATIVE mg/dL   Nitrite NEGATIVE NEGATIVE   Leukocytes, UA NEGATIVE NEGATIVE  Urine microscopic-add on     Status: Abnormal   Collection Time: 05/30/16  3:07 PM  Result Value Ref Range   Squamous Epithelial / LPF 0-5 (A) NONE SEEN   WBC, UA 0-5 0 - 5 WBC/hpf   RBC / HPF 0-5 0 - 5 RBC/hpf   Bacteria, UA FEW (A) NONE SEEN   Urine-Other MUCOUS  PRESENT   I-Stat CG4 Lactic Acid, ED  (not at  [facility])     Status: None   Collection Time: 05/30/16  6:38 PM  Result Value Ref Range   Lactic Acid, Venous 0.70 0.5 - 1.9 mmol/L  Magnesium     Status: Abnormal   Collection Time: 05/30/16  7:53 PM  Result Value Ref Range   Magnesium 1.4 (L) 1.7 - 2.4 mg/dL  Phosphorus     Status: None   Collection Time: 05/30/16  7:53 PM  Result  Value Ref Range   Phosphorus 3.0 2.5 - 4.6 mg/dL  Hemoglobin A1c     Status: Abnormal   Collection Time: 05/30/16  7:53 PM  Result Value Ref Range   Hgb A1c MFr Bld 7.4 (H) 4.8 - 5.6 %    Comment: (NOTE)         Pre-diabetes: 5.7 - 6.4         Diabetes: >6.4         Glycemic control for adults with diabetes: <7.0    Mean Plasma Glucose 166 mg/dL    Comment: (NOTE) Performed At: [facility] [place], [place] [id number] Lindon Romp MD DG:[id number]   Glucose, capillary     Status: Abnormal   Collection Time: 05/30/16  9:10 PM  Result Value Ref Range   Glucose-Capillary 234 (H) 65 - 99 mg/dL  Glucose, capillary     Status: Abnormal   Collection Time: 05/31/16  5:49 AM  Result Value Ref Range   Glucose-Capillary 208 (H) 65 - 99 mg/dL  Basic metabolic panel     Status: Abnormal   Collection Time: 05/31/16  6:10 AM  Result Value Ref Range   Sodium 136 135 - 145 mmol/L   Potassium 4.1 3.5 - 5.1 mmol/L    Comment: DELTA CHECK NOTED   Chloride 100 (L) 101 - 111 mmol/L   CO2 33 (H) 22 - 32 mmol/L   Glucose, Bld 249 (H) 65 - 99 mg/dL   BUN 7 6 - 20 mg/dL   Creatinine, Ser 0.31 (L) 0.44 - 1.00 mg/dL   Calcium 8.4 (L) 8.9 - 10.3 mg/dL   GFR calc non Af Amer >60 >60 mL/min   GFR calc Af Amer >60 >60 mL/min    Comment: (NOTE) The eGFR has been calculated using the CKD EPI equation. This calculation has not been validated in all clinical situations. eGFR's persistently <60 mL/min signify possible Chronic Kidney Disease.    Anion gap 3 (L) 5 - 15  Glucose, capillary     Status: Abnormal   Collection Time: 05/31/16  7:48 AM  Result Value Ref Range   Glucose-Capillary 206 (H) 65 - 99 mg/dL  Glucose, capillary     Status: Abnormal   Collection Time: 05/31/16 11:25 AM  Result Value Ref Range   Glucose-Capillary 233 (H) 65 - 99 mg/dL  Glucose, capillary     Status: Abnormal   Collection Time: 05/31/16  4:23 PM  Result Value Ref Range   Glucose-Capillary  276 (H) 65 - 99 mg/dL  Glucose, capillary     Status: Abnormal   Collection Time: 05/31/16  9:30 PM  Result Value Ref Range   Glucose-Capillary 236 (H) 65 - 99 mg/dL  Basic metabolic panel     Status: Abnormal   Collection Time: 06/01/16  5:14 AM  Result Value Ref Range   Sodium 138 135 - 145 mmol/L   Potassium 3.7 3.5 - 5.1 mmol/L   Chloride  98 (L) 101 - 111 mmol/L   CO2 34 (H) 22 - 32 mmol/L   Glucose, Bld 253 (H) 65 - 99 mg/dL   BUN 7 6 - 20 mg/dL   Creatinine, Ser 0.34 (L) 0.44 - 1.00 mg/dL   Calcium 8.6 (L) 8.9 - 10.3 mg/dL   GFR calc non Af Amer >60 >60 mL/min   GFR calc Af Amer >60 >60 mL/min    Comment: (NOTE) The eGFR has been calculated using the CKD EPI equation. This calculation has not been validated in all clinical situations. eGFR's persistently <60 mL/min signify possible Chronic Kidney Disease.    Anion gap 6 5 - 15  Glucose, capillary     Status: Abnormal   Collection Time: 06/01/16  7:39 AM  Result Value Ref Range   Glucose-Capillary 196 (H) 65 - 99 mg/dL   Comment 1 Notify RN     ABGS  Recent Labs  05/30/16 1500  PHART 7.285*  PO2ART 62.0*  TCO2 75.0  HCO3 29.8*   CULTURES Recent Results (from the past 240 hour(s))  Blood Culture (routine x 2)     Status: None (Preliminary result)   Collection Time: 05/30/16  2:36 PM  Result Value Ref Range Status   Specimen Description RIGHT ANTECUBITAL  Final   Special Requests BOTTLES DRAWN AEROBIC AND ANAEROBIC 8CC EACH  Final   Culture NO GROWTH < 24 HOURS  Final   Report Status PENDING  Incomplete  Blood Culture (routine x 2)     Status: None (Preliminary result)   Collection Time: 05/30/16  2:44 PM  Result Value Ref Range Status   Specimen Description RIGHT ANTECUBITAL  Final   Special Requests BOTTLES DRAWN AEROBIC AND ANAEROBIC 6CC EACH  Final   Culture NO GROWTH < 24 HOURS  Final   Report Status PENDING  Incomplete   Studies/Results: Ct Abdomen Pelvis W Contrast  05/30/2016  CLINICAL DATA:   Abdominal pain for 3 days. EXAM: CT ABDOMEN AND PELVIS WITH CONTRAST TECHNIQUE: Multidetector CT imaging of the abdomen and pelvis was performed using the standard protocol following bolus administration of intravenous contrast. CONTRAST:  175m ISOVUE-300 IOPAMIDOL (ISOVUE-300) INJECTION 61% COMPARISON:  CT 02/03/2012 FINDINGS: Lower chest: Branching nodular pattern in the LEFT and RIGHT lower lobes but much more dense in the RIGHT lower lobe. Hepatobiliary: Intrahepatic and extrahepatic duct dilatation. The common bile duct measures 10 mm. This is new from comparison exam 2013. The gallbladder is normal diameter at 3.8 cm without evidence of inflammation. Pancreas: There is no pancreatic duct dilatation. No lesion in the pancreatic head identified by CT. Spleen: Normal spleen Adrenals/urinary tract: Adrenal glands and kidneys are normal. The ureters and bladder normal. Stomach/Bowel: Stomach, small bowel, appendix, and cecum are normal. The colon and rectosigmoid colon are normal. Vascular/Lymphatic: Abdominal aorta is normal caliber. There is no retroperitoneal or periportal lymphadenopathy. No pelvic lymphadenopathy. Reproductive: Uterus normal. Other: No free fluid. Musculoskeletal: No aggressive osseous lesion. IMPRESSION: 1. Bilateral nodular airspace disease consists with pulmonary infection. Infection most dense in the RIGHT lower lobe. Hilar adenopathy related to the pulmonary infection. Consider atypical infections. 2. Mild intra and extrahepatic biliary duct dilatation to the level of the distal common bile duct. Cannot exclude a distal obstructing lesion. No pancreatic duct dilatation. Recommend correlation with bilirubin. Electronically Signed   By: SSuzy BouchardM.D.   On: 05/30/2016 17:57   Dg Chest Port 1 View  05/30/2016  CLINICAL DATA:  Code sepsis, cough, productive cough in triage, history  asthma, hypertension, diabetes mellitus, ARDS, COPD, smoker, GERD EXAM: PORTABLE CHEST 1 VIEW  COMPARISON:  Portable exam 1428 hours compared to 10/15/2015 FINDINGS: Normal heart size, mediastinal contours, and pulmonary vascularity. Chronic versus recurrent infiltrate at RIGHT lower lobe. Minimal chronic accentuation of LEFT basilar markings as well. Slight prominent hila stable. Upper lungs clear. No pleural effusion or pneumothorax. IMPRESSION: Persistent versus recurrent RIGHT lower lobe infiltrate. Electronically Signed   By: Lavonia Dana M.D.   On: 05/30/2016 14:48    Medications:  Prior to Admission:  Prescriptions prior to admission  Medication Sig Dispense Refill Last Dose  . acetaminophen (TYLENOL) 500 MG tablet Take 500 mg by mouth every 6 (six) hours as needed for fever.    05/29/2016 at Unknown time  . albuterol (PROAIR HFA) 108 (90 BASE) MCG/ACT inhaler Inhale 2 puffs into the lungs every 6 (six) hours as needed. For shortness of breATH   05/30/2016 at Unknown time  . albuterol (PROVENTIL) (2.5 MG/3ML) 0.083% nebulizer solution Take 2.5 mg by nebulization every 4 (four) hours as needed. For shortness of breath    unknown  . budesonide-formoterol (SYMBICORT) 160-4.5 MCG/ACT inhaler Inhale 2 puffs into the lungs 2 (two) times daily.   05/30/2016 at Unknown time  . clonazePAM (KLONOPIN) 0.5 MG tablet Take 0.5 mg by mouth 3 (three) times daily.    05/29/2016 at Unknown time  . lisinopril (PRINIVIL,ZESTRIL) 20 MG tablet Take 20 mg by mouth daily.   05/29/2016 at Unknown time  . OxyCODONE (OXYCONTIN) 10 mg T12A Take 1 tablet (10 mg total) by mouth every 12 (twelve) hours. 60 tablet 0 05/29/2016 at Unknown time  . oxyCODONE (ROXICODONE) 15 MG immediate release tablet Take 15 mg by mouth every 4 (four) hours.   05/29/2016 at Unknown time  . Oxymetazoline HCl (AFRIN NASAL SPRAY NA) Place 1 spray into the nose daily as needed (Congestion).    unknown  . pantoprazole (PROTONIX) 40 MG tablet Take 40 mg by mouth daily.   05/29/2016 at Unknown time  . pregabalin (LYRICA) 50 MG capsule Take 50-100 mg by mouth  every 8 (eight) hours. 1 twice daily and two in the evening   05/29/2016 at Unknown time  . promethazine (PHENERGAN) 25 MG tablet Take 25 mg by mouth every 6 (six) hours as needed for nausea.    05/29/2016 at Unknown time  . RESTASIS 0.05 % ophthalmic emulsion Place 1 drop into both eyes 2 (two) times daily.  3 Past Week at Unknown time  . tiotropium (SPIRIVA) 18 MCG inhalation capsule Place 18 mcg into inhaler and inhale daily.   05/29/2016 at Unknown time   Scheduled: . aztreonam  1 g Intravenous Q8H  . budesonide (PULMICORT) nebulizer solution  0.25 mg Nebulization BID  . cycloSPORINE  1 drop Both Eyes BID  . enoxaparin (LOVENOX) injection  40 mg Subcutaneous Q24H  . insulin aspart  0-5 Units Subcutaneous QHS  . insulin aspart  0-9 Units Subcutaneous TID WC  . ipratropium-albuterol  3 mL Nebulization Q6H  . methylPREDNISolone (SOLU-MEDROL) injection  40 mg Intravenous Q12H  . oxyCODONE  10 mg Oral Q12H  . oxymetazoline  1 spray Each Nare BID  . pantoprazole  40 mg Oral Daily  . pregabalin  50 mg Oral Q8H  . vancomycin  750 mg Intravenous Q12H   Continuous: . 0.9 % NaCl with KCl 20 mEq / L 75 mL/hr at 05/31/16 2213   MOL:[id number], clonazePAM, ipratropium-albuterol, metoCLOPramide (REGLAN) injection, oxyCODONE  Assesment: She was admitted  with community-acquired pneumonia. 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 She is much improved. I think she can go home.  She has chronic pain on pain medications  She has chronic anxiety on anxiety medications and this in her pain are fairly well controlled Active Problems:   CAP (community acquired pneumonia)   Pneumonia    Plan: Discharge home today please see discharge summary for details    LOS: 2 days   Lakendrick Paradis L 06/01/2016, 8:45 AM

## 2016-06-01 NOTE — Progress Notes (Signed)
Pt called to front desk very upset. She states she had been waiting over two hours for pain medication. Pt last had prn pain medication at 14:52. Assessed pt and administered pain medication. Reviewed next dose schedule with pt.

## 2016-06-01 NOTE — Progress Notes (Signed)
Family pushing pt in wheelchair around hospital halls. Family and pt stopped to front desk and asked if they could leave the floor with pt. Nursing and respiratory staff stated that pt was not allowed to leave the floor. Pt and family upset and continued to walk around hospital floor.

## 2016-06-01 NOTE — Progress Notes (Addendum)
Pt upset. Request nursing staff give Klonopin and pain medications before due time. Explained to pt, that medications cannot be given before due time. Klonopin is due to be given at 1:06 a.m. Emotional support given.

## 2016-06-04 LAB — CULTURE, BLOOD (ROUTINE X 2)
CULTURE: NO GROWTH
Culture: NO GROWTH

## 2016-07-11 IMAGING — CT CT HEAD W/O CM
1 series · 15 of 30 positions shown, 19 images · non-contrast
Comparison: 07/27/2009

CLINICAL DATA: Seizures. Headaches and earaches for 1 month.
History of right ear surgery when patient was 13 years old.

EXAM:
CT HEAD WITHOUT CONTRAST
TECHNIQUE: Contiguous axial images were obtained from the base of the skull
through the vertex without intravenous contrast.

[Series 2: headseq 4.8 h37s · axial · 0.43mm/px · z∈[+70,+220]mm · 15 of 35 slices shown, 19 images]
[im 2/35  brain]
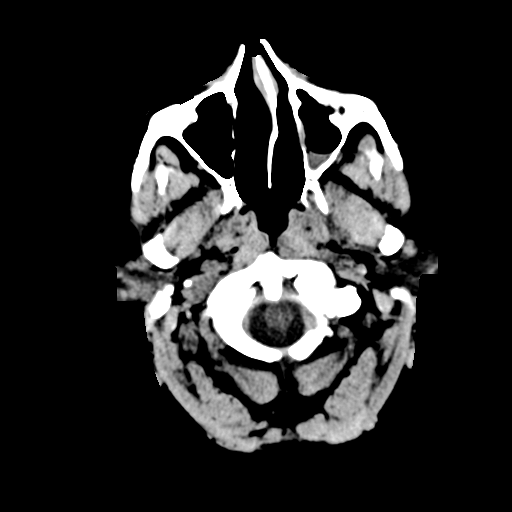
[im 2/35  bone]
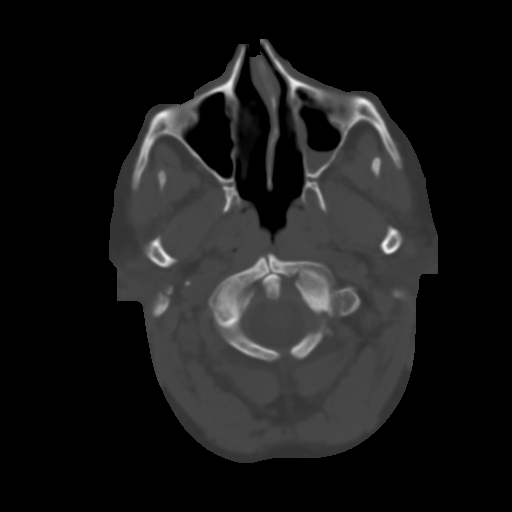
[im 4/35  brain]
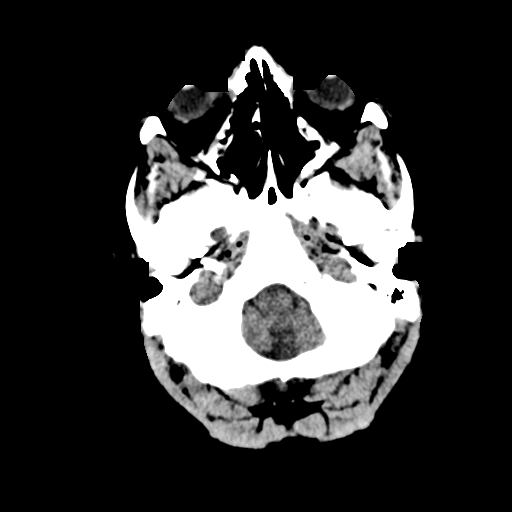
[im 6/35  brain]
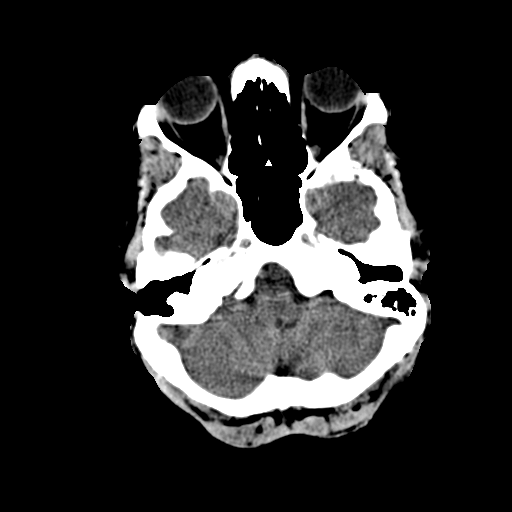
[im 9/35  brain]
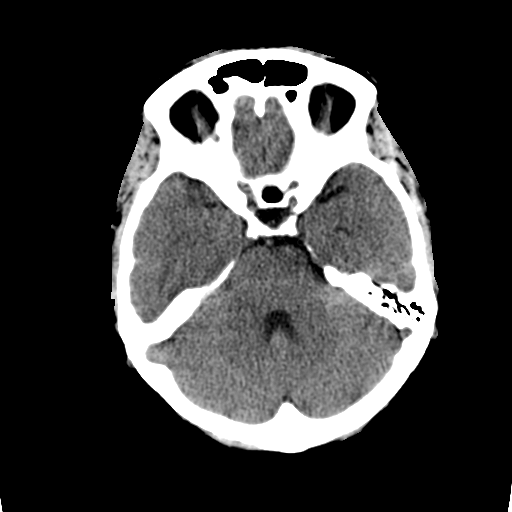
[im 11/35  brain]
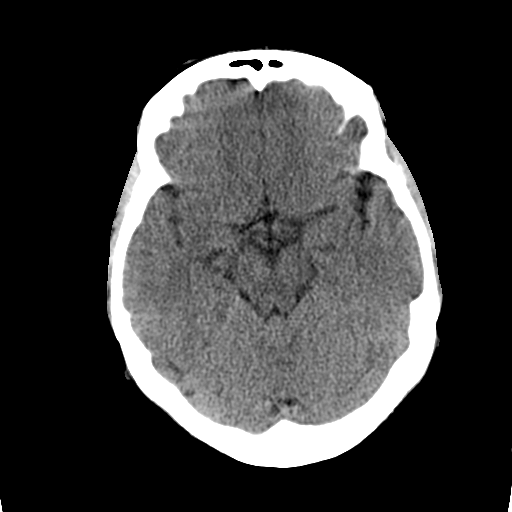
[im 11/35  bone]
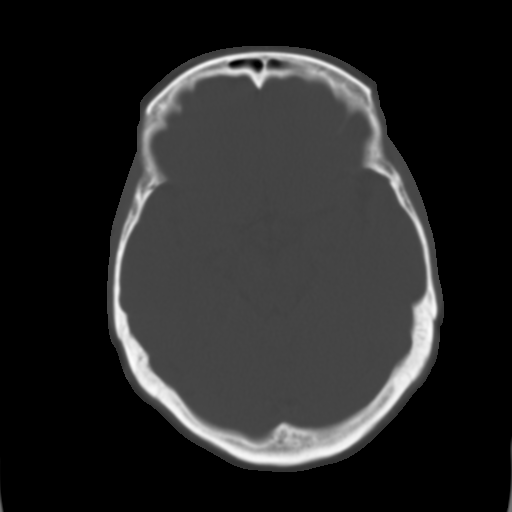
[im 13/35  brain]
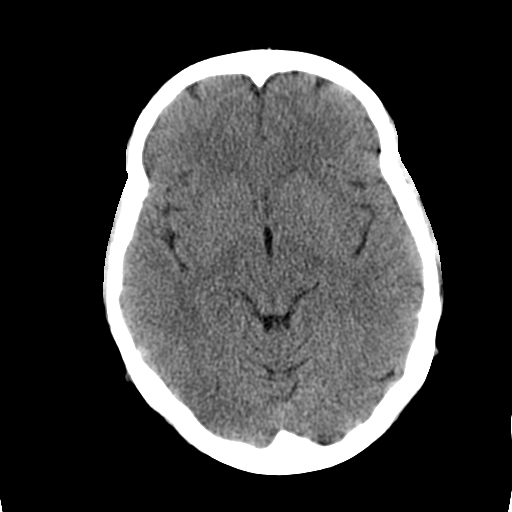
[im 16/35  brain]
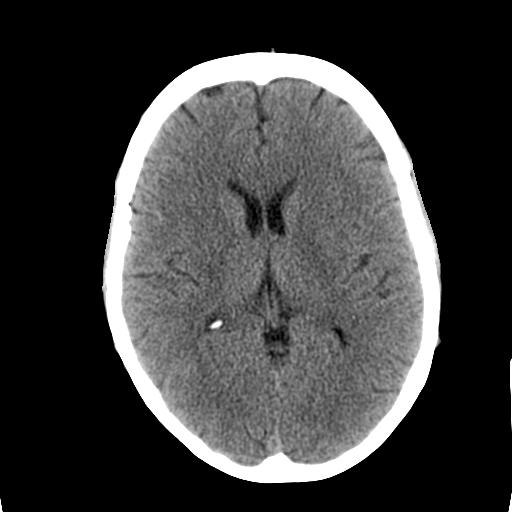
[im 18/35  brain]
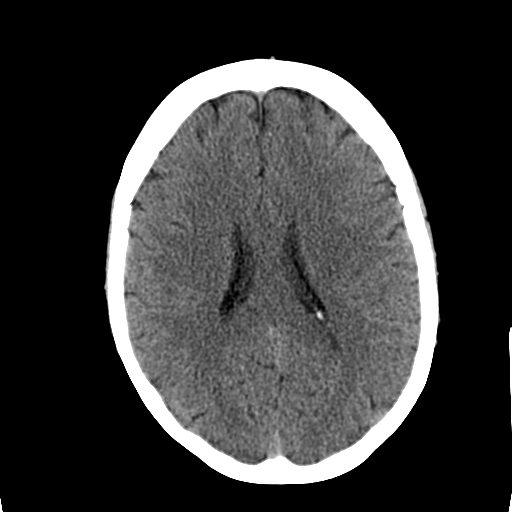
[im 19/35  brain]
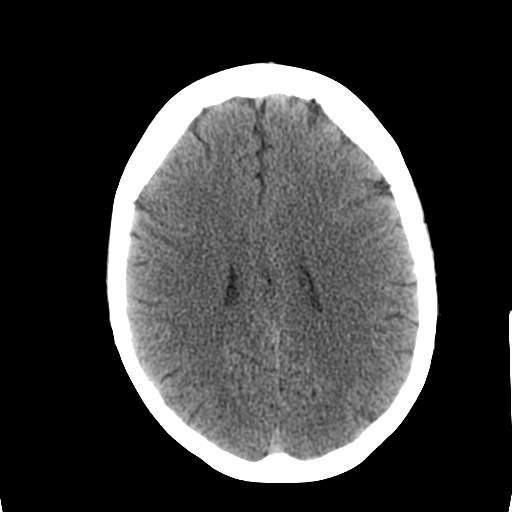
[im 19/35  bone]
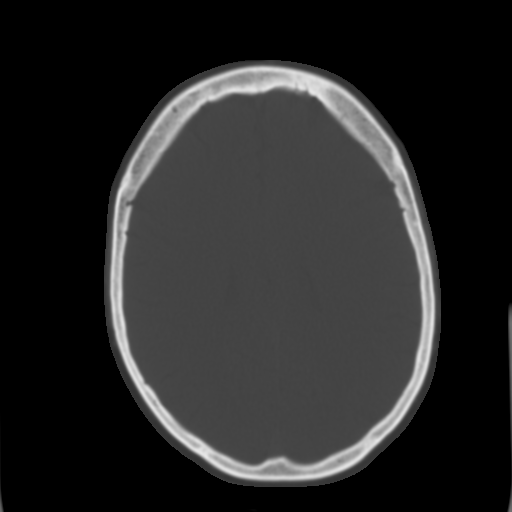
[im 22/35  brain]
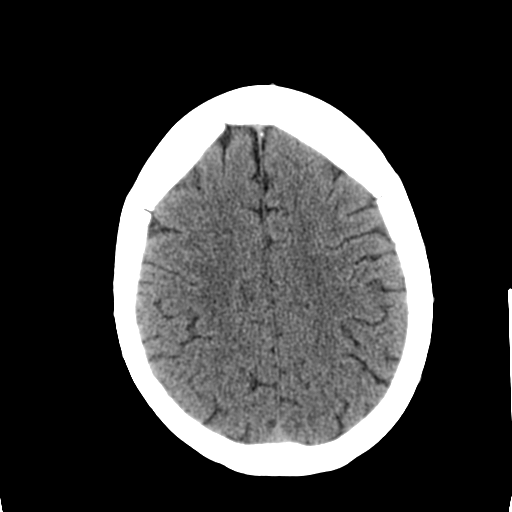
[im 24/35  brain]
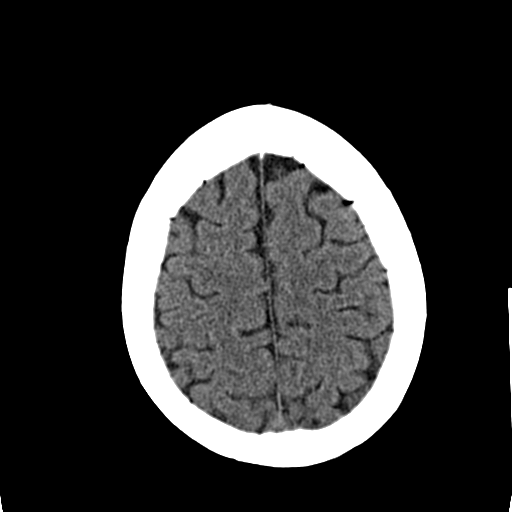
[im 26/35  brain]
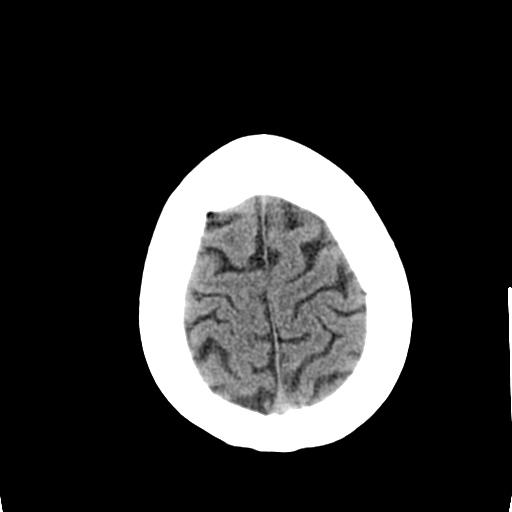
[im 29/35  brain]
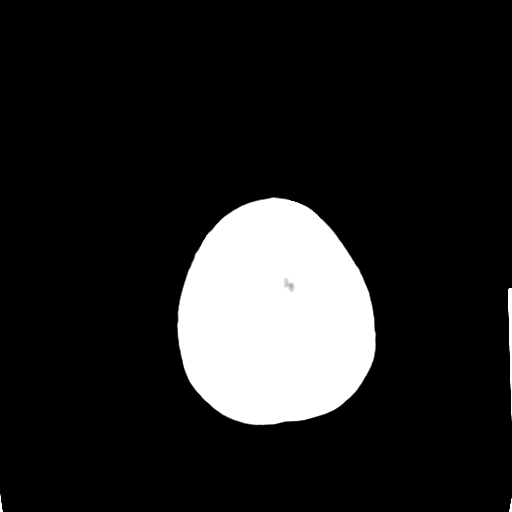
[im 29/35  bone]
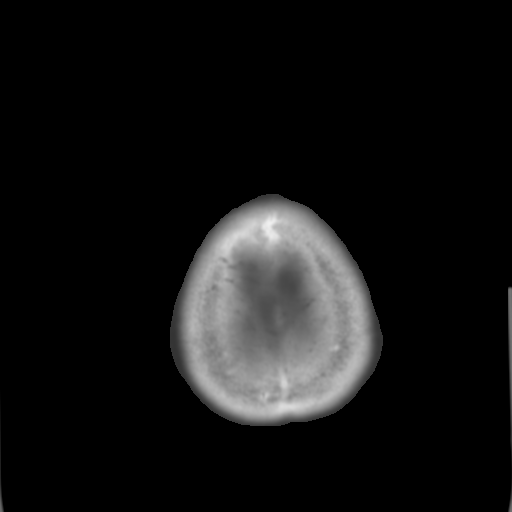
[im 31/35  brain]
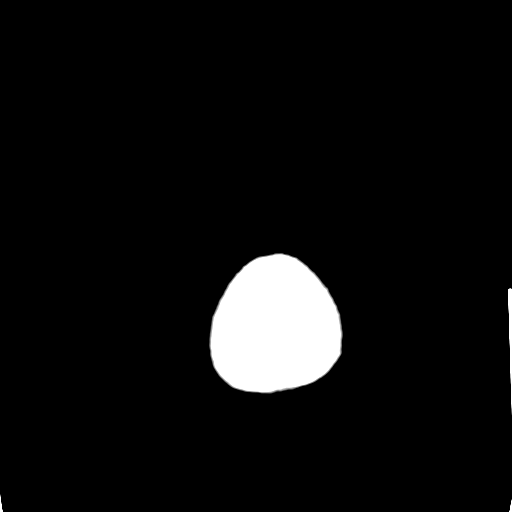
[im 33/35  brain]
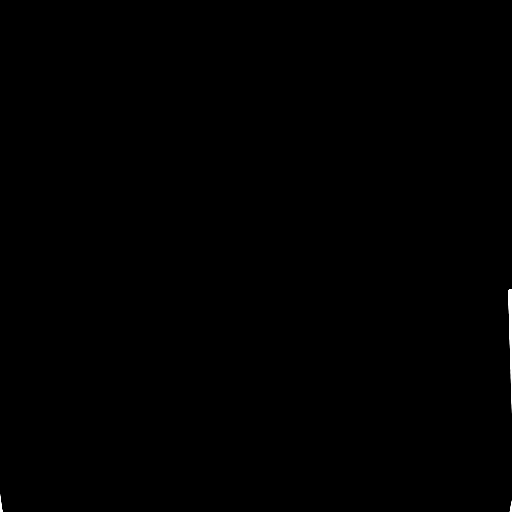

[15 of 30 positions shown; findings below may reference images not displayed]

FINDINGS: Ventricles are normal in size and configuration. There are no
parenchymal masses or mass effect. There are no areas of abnormal
parenchymal attenuation. There is no evidence of an infarct.

There are no extra-axial masses or abnormal fluid collections.

There is no intracranial hemorrhage.

A fluid level lies in the left maxillary sinus and there is mild
left maxillary sinus mucosal thickening. Remaining visualized
sinuses are clear. Patient has undergone a previous wall down right
mastoidectomy, stable from the prior CT. Clear mastoid air cells and
middle ear cavities.

There is incomplete fusion of the anterior arch of C1, stable.
IMPRESSION: 1. No intracranial abnormality.
2. Sinus disease with a left maxillary sinus air-fluid level.
Consider acute sinusitis given the patient's history.

## 2016-09-28 ENCOUNTER — Inpatient Hospital Stay (HOSPITAL_COMMUNITY)
Admission: EM | Admit: 2016-09-28 | Discharge: 2016-10-01 | DRG: 194 | Disposition: A | Discharge status: Home / Self Care | Payer: Medicaid Other | Attending: Pulmonary Disease | Admitting: Pulmonary Disease

## 2016-09-28 ENCOUNTER — Emergency Department (HOSPITAL_COMMUNITY): Payer: Medicaid Other

## 2016-09-28 ENCOUNTER — Encounter (HOSPITAL_COMMUNITY): Payer: Self-pay | Admitting: *Deleted

## 2016-09-28 DIAGNOSIS — Z9981 Dependence on supplemental oxygen: Secondary | ICD-10-CM | POA: Diagnosis not present

## 2016-09-28 DIAGNOSIS — Z885 Allergy status to narcotic agent status: Secondary | ICD-10-CM

## 2016-09-28 DIAGNOSIS — I10 Essential (primary) hypertension: Secondary | ICD-10-CM | POA: Diagnosis present

## 2016-09-28 DIAGNOSIS — Z833 Family history of diabetes mellitus: Secondary | ICD-10-CM

## 2016-09-28 DIAGNOSIS — J441 Chronic obstructive pulmonary disease with (acute) exacerbation: Secondary | ICD-10-CM | POA: Diagnosis present

## 2016-09-28 DIAGNOSIS — E119 Type 2 diabetes mellitus without complications: Secondary | ICD-10-CM

## 2016-09-28 DIAGNOSIS — J189 Pneumonia, unspecified organism: Secondary | ICD-10-CM | POA: Diagnosis present

## 2016-09-28 DIAGNOSIS — Z881 Allergy status to other antibiotic agents status: Secondary | ICD-10-CM | POA: Diagnosis not present

## 2016-09-28 DIAGNOSIS — J440 Chronic obstructive pulmonary disease with acute lower respiratory infection: Secondary | ICD-10-CM | POA: Diagnosis present

## 2016-09-28 DIAGNOSIS — J9611 Chronic respiratory failure with hypoxia: Secondary | ICD-10-CM | POA: Diagnosis present

## 2016-09-28 DIAGNOSIS — J449 Chronic obstructive pulmonary disease, unspecified: Secondary | ICD-10-CM | POA: Insufficient documentation

## 2016-09-28 DIAGNOSIS — K219 Gastro-esophageal reflux disease without esophagitis: Secondary | ICD-10-CM | POA: Diagnosis present

## 2016-09-28 DIAGNOSIS — Z880 Allergy status to penicillin: Secondary | ICD-10-CM

## 2016-09-28 DIAGNOSIS — M6281 Muscle weakness (generalized): Secondary | ICD-10-CM

## 2016-09-28 DIAGNOSIS — Z8249 Family history of ischemic heart disease and other diseases of the circulatory system: Secondary | ICD-10-CM

## 2016-09-28 DIAGNOSIS — G8929 Other chronic pain: Secondary | ICD-10-CM | POA: Diagnosis present

## 2016-09-28 DIAGNOSIS — Z79899 Other long term (current) drug therapy: Secondary | ICD-10-CM

## 2016-09-28 DIAGNOSIS — F17210 Nicotine dependence, cigarettes, uncomplicated: Secondary | ICD-10-CM | POA: Diagnosis present

## 2016-09-28 DIAGNOSIS — M549 Dorsalgia, unspecified: Secondary | ICD-10-CM | POA: Diagnosis present

## 2016-09-28 DIAGNOSIS — Z23 Encounter for immunization: Secondary | ICD-10-CM

## 2016-09-28 DIAGNOSIS — F411 Generalized anxiety disorder: Secondary | ICD-10-CM | POA: Diagnosis present

## 2016-09-28 LAB — COMPREHENSIVE METABOLIC PANEL
ALT: 15 U/L (ref 14–54)
ANION GAP: 9 (ref 5–15)
AST: 28 U/L (ref 15–41)
Albumin: 3.7 g/dL (ref 3.5–5.0)
Alkaline Phosphatase: 88 U/L (ref 38–126)
BUN: 9 mg/dL (ref 6–20)
CHLORIDE: 90 mmol/L — AB (ref 101–111)
CO2: 36 mmol/L — ABNORMAL HIGH (ref 22–32)
CREATININE: 0.38 mg/dL — AB (ref 0.44–1.00)
Calcium: 9.2 mg/dL (ref 8.9–10.3)
Glucose, Bld: 200 mg/dL — ABNORMAL HIGH (ref 65–99)
POTASSIUM: 4 mmol/L (ref 3.5–5.1)
Sodium: 135 mmol/L (ref 135–145)
Total Bilirubin: 0.2 mg/dL — ABNORMAL LOW (ref 0.3–1.2)
Total Protein: 7.5 g/dL (ref 6.5–8.1)

## 2016-09-28 LAB — CBC
HEMATOCRIT: 39.2 % (ref 36.0–46.0)
HEMOGLOBIN: 12.8 g/dL (ref 12.0–15.0)
MCH: 28.6 pg (ref 26.0–34.0)
MCHC: 32.7 g/dL (ref 30.0–36.0)
MCV: 87.7 fL (ref 78.0–100.0)
PLATELETS: 396 10*3/uL (ref 150–400)
RBC: 4.47 MIL/uL (ref 3.87–5.11)
RDW: 12 % (ref 11.5–15.5)
WBC: 20.1 10*3/uL — AB (ref 4.0–10.5)

## 2016-09-28 LAB — URINALYSIS, ROUTINE W REFLEX MICROSCOPIC
Bilirubin Urine: NEGATIVE
GLUCOSE, UA: 250 mg/dL — AB
LEUKOCYTES UA: NEGATIVE
Nitrite: NEGATIVE
PROTEIN: 100 mg/dL — AB
pH: 6 (ref 5.0–8.0)

## 2016-09-28 LAB — URINE MICROSCOPIC-ADD ON

## 2016-09-28 LAB — LIPASE, BLOOD: LIPASE: 10 U/L — AB (ref 11–51)

## 2016-09-28 MED ORDER — ACETAMINOPHEN 500 MG PO TABS: 500.0000 mg | ORAL_TABLET | Freq: Four times a day (QID) | ORAL | Status: DC | PRN

## 2016-09-28 MED ORDER — ONDANSETRON HCL 4 MG/2ML IJ SOLN
4.0000 mg | Freq: Once | INTRAMUSCULAR | Status: AC
  Administered 2016-09-28: 4 mg via INTRAVENOUS
  Filled 2016-09-28: qty 2

## 2016-09-28 MED ORDER — ONDANSETRON HCL 4 MG/2ML IJ SOLN
4.0000 mg | Freq: Once | INTRAMUSCULAR | Status: AC
  Administered 2016-09-28: 4 mg via INTRAVENOUS

## 2016-09-28 MED ORDER — INSULIN ASPART 100 UNIT/ML ~~LOC~~ SOLN
0.0000 [IU] | Freq: Three times a day (TID) | SUBCUTANEOUS | Status: DC
  Administered 2016-09-29: 5 [IU] via SUBCUTANEOUS
  Administered 2016-09-29 – 2016-09-30 (×3): 7 [IU] via SUBCUTANEOUS
  Administered 2016-09-30: 2 [IU] via SUBCUTANEOUS
  Administered 2016-10-01: 5 [IU] via SUBCUTANEOUS

## 2016-09-28 MED ORDER — ONDANSETRON HCL 4 MG/2ML IJ SOLN
INTRAMUSCULAR | Status: AC
  Filled 2016-09-28: qty 2

## 2016-09-28 MED ORDER — MORPHINE SULFATE (PF) 4 MG/ML IV SOLN
4.0000 mg | Freq: Once | INTRAVENOUS | Status: AC
  Administered 2016-09-28: 4 mg via INTRAVENOUS
  Filled 2016-09-28: qty 1

## 2016-09-28 MED ORDER — METHYLPREDNISOLONE SODIUM SUCC 125 MG IJ SOLR
60.0000 mg | Freq: Four times a day (QID) | INTRAMUSCULAR | Status: DC
  Administered 2016-09-29 – 2016-10-01 (×10): 60 mg via INTRAVENOUS
  Filled 2016-09-28 (×10): qty 2

## 2016-09-28 MED ORDER — CYCLOSPORINE 0.05 % OP EMUL
1.0000 [drp] | Freq: Two times a day (BID) | OPHTHALMIC | Status: DC
  Administered 2016-09-29 – 2016-10-01 (×5): 1 [drp] via OPHTHALMIC
  Filled 2016-09-28 (×12): qty 1

## 2016-09-28 MED ORDER — IBUPROFEN 400 MG PO TABS: 400.0000 mg | ORAL_TABLET | Freq: Four times a day (QID) | ORAL | Status: DC | PRN

## 2016-09-28 MED ORDER — SUMATRIPTAN SUCCINATE 25 MG PO TABS
50.0000 mg | ORAL_TABLET | Freq: Every day | ORAL | Status: DC | PRN
  Administered 2016-09-29: 50 mg via ORAL
  Filled 2016-09-28 (×3): qty 2

## 2016-09-28 MED ORDER — OXYCODONE HCL ER 10 MG PO T12A
10.0000 mg | EXTENDED_RELEASE_TABLET | Freq: Two times a day (BID) | ORAL | Status: DC
  Administered 2016-09-29 – 2016-10-01 (×6): 10 mg via ORAL
  Filled 2016-09-28 (×6): qty 1

## 2016-09-28 MED ORDER — SODIUM CHLORIDE 0.9 % IV BOLUS (SEPSIS)
1000.0000 mL | Freq: Once | INTRAVENOUS | Status: AC
  Administered 2016-09-28: 1000 mL via INTRAVENOUS

## 2016-09-28 MED ORDER — DEXTROSE 5 % IV SOLN
1.0000 g | INTRAVENOUS | Status: DC
  Administered 2016-09-29 – 2016-09-30 (×2): 1 g via INTRAVENOUS
  Filled 2016-09-28 (×3): qty 10

## 2016-09-28 MED ORDER — INSULIN ASPART 100 UNIT/ML ~~LOC~~ SOLN
0.0000 [IU] | Freq: Every day | SUBCUTANEOUS | Status: DC
  Administered 2016-09-29 – 2016-09-30 (×3): 3 [IU] via SUBCUTANEOUS

## 2016-09-28 MED ORDER — METHYLPREDNISOLONE SODIUM SUCC 125 MG IJ SOLR
125.0000 mg | Freq: Once | INTRAMUSCULAR | Status: AC
  Administered 2016-09-28: 125 mg via INTRAVENOUS
  Filled 2016-09-28: qty 2

## 2016-09-28 MED ORDER — IOPAMIDOL (ISOVUE-300) INJECTION 61%
75.0000 mL | Freq: Once | INTRAVENOUS | Status: AC | PRN
  Administered 2016-09-28: 75 mL via INTRAVENOUS

## 2016-09-28 MED ORDER — IPRATROPIUM-ALBUTEROL 0.5-2.5 (3) MG/3ML IN SOLN
3.0000 mL | Freq: Once | RESPIRATORY_TRACT | Status: AC
  Administered 2016-09-28: 3 mL via RESPIRATORY_TRACT
  Filled 2016-09-28: qty 3

## 2016-09-28 MED ORDER — PANTOPRAZOLE SODIUM 40 MG PO TBEC
40.0000 mg | DELAYED_RELEASE_TABLET | Freq: Every day | ORAL | Status: DC
  Administered 2016-09-29 – 2016-10-01 (×3): 40 mg via ORAL
  Filled 2016-09-28 (×3): qty 1

## 2016-09-28 MED ORDER — SODIUM CHLORIDE 0.9 % IV BOLUS (SEPSIS)
1000.0000 mL | Freq: Once | INTRAVENOUS | Status: AC
Start: 2016-09-28 — End: 2016-09-28
  Administered 2016-09-28: 1000 mL via INTRAVENOUS

## 2016-09-28 MED ORDER — IPRATROPIUM-ALBUTEROL 0.5-2.5 (3) MG/3ML IN SOLN
3.0000 mL | RESPIRATORY_TRACT | Status: DC | PRN
  Administered 2016-09-29 – 2016-10-01 (×5): 3 mL via RESPIRATORY_TRACT
  Filled 2016-09-28 (×5): qty 3

## 2016-09-28 MED ORDER — ENOXAPARIN SODIUM 40 MG/0.4ML ~~LOC~~ SOLN
40.0000 mg | SUBCUTANEOUS | Status: DC
  Administered 2016-09-29 – 2016-09-30 (×3): 40 mg via SUBCUTANEOUS
  Filled 2016-09-28 (×3): qty 0.4

## 2016-09-28 MED ORDER — PREGABALIN 50 MG PO CAPS: 50.0000 mg | ORAL_CAPSULE | Freq: Three times a day (TID) | ORAL | Status: DC

## 2016-09-28 MED ORDER — DEXTROSE 5 % IV SOLN
1.0000 g | Freq: Once | INTRAVENOUS | Status: AC
  Administered 2016-09-28: 1 g via INTRAVENOUS
  Filled 2016-09-28: qty 10

## 2016-09-28 MED ORDER — SODIUM CHLORIDE 0.9 % IV SOLN
INTRAVENOUS | Status: AC
  Administered 2016-09-29: 01:00:00 via INTRAVENOUS

## 2016-09-28 MED ORDER — AZITHROMYCIN 250 MG PO TABS
500.0000 mg | ORAL_TABLET | ORAL | Status: DC
  Administered 2016-09-29 – 2016-09-30 (×2): 500 mg via ORAL
  Filled 2016-09-28 (×2): qty 2

## 2016-09-28 MED ORDER — DEXTROSE 5 % IV SOLN
500.0000 mg | INTRAVENOUS | Status: DC
  Administered 2016-09-28: 500 mg via INTRAVENOUS
  Filled 2016-09-28: qty 500

## 2016-09-28 MED ORDER — LISINOPRIL 10 MG PO TABS
20.0000 mg | ORAL_TABLET | Freq: Every day | ORAL | Status: DC
  Administered 2016-09-29 – 2016-10-01 (×3): 20 mg via ORAL
  Filled 2016-09-28 (×3): qty 2

## 2016-09-28 MED ORDER — MOMETASONE FURO-FORMOTEROL FUM 200-5 MCG/ACT IN AERO
2.0000 | INHALATION_SPRAY | Freq: Two times a day (BID) | RESPIRATORY_TRACT | Status: DC
  Administered 2016-09-29 – 2016-10-01 (×5): 2 via RESPIRATORY_TRACT
  Filled 2016-09-28: qty 8.8

## 2016-09-28 MED ORDER — FLUTICASONE PROPIONATE 50 MCG/ACT NA SUSP
2.0000 | Freq: Every day | NASAL | Status: DC
  Administered 2016-09-29 – 2016-10-01 (×3): 2 via NASAL
  Filled 2016-09-28 (×2): qty 16

## 2016-09-28 MED ORDER — OXYCODONE HCL 5 MG PO TABS
15.0000 mg | ORAL_TABLET | ORAL | Status: DC | PRN
  Administered 2016-09-29 – 2016-09-30 (×9): 15 mg via ORAL
  Filled 2016-09-28 (×9): qty 3

## 2016-09-28 MED ORDER — CLONAZEPAM 0.5 MG PO TABS
0.5000 mg | ORAL_TABLET | Freq: Three times a day (TID) | ORAL | Status: DC
  Administered 2016-09-29 – 2016-10-01 (×7): 0.5 mg via ORAL
  Filled 2016-09-28 (×7): qty 1

## 2016-09-28 MED ORDER — PROMETHAZINE HCL 12.5 MG PO TABS: 25.0000 mg | ORAL_TABLET | Freq: Four times a day (QID) | ORAL | Status: DC | PRN

## 2016-09-28 NOTE — ED Triage Notes (Signed)
Pt comes in with abdominal pain including n/v/d since last Thursday. Pt wears oxygen at home and is 2L now with an oxygen of 91%.

## 2016-09-28 NOTE — ED Notes (Signed)
Pt has kept water down.

## 2016-09-28 NOTE — ED Triage Notes (Signed)
Patient states she is on 2.5 - 3 L O2 via nasal canula at home. Patient arrives to triage room with oxygen tubing around her neck instead of in her nose. Patient's O2 saturation 88%. Assisted patient with placing oxygen tubing back in her nose. O2 saturation increased to 91% on 3L.

## 2016-09-28 NOTE — ED Notes (Signed)
Pt also c/o ha rating 10 and requesitn pain med. Advised edp will be in asap.

## 2016-09-28 NOTE — ED Notes (Signed)
Pt to CT

## 2016-09-28 NOTE — ED Provider Notes (Signed)
[facility]-EM[facility]Y DEPT Provider Note   CSN: [id number] Arrival date & time: 09/28/16  1245     History   Chief Complaint Chief Complaint  Patient presents with  . Abdominal Pain    HPI [name] is a 50 y.o. female.  Level V caveat for urgent need for intervention. Patient with known COPD presents with worsening cough and dyspnea. She is on nasal oxygen at home chronically. Severity of symptoms is moderate. She thinks she has pneumonia.      Past Medical History:  Diagnosis Date  . Angina   . Anxiety state 10/15/2015  . ARDS (adult respiratory distress syndrome) [place])    Jan 2011  . Asthma   . Chronic back pain   . COPD (chronic obstructive pulmonary disease) (HCC)   . Diabetes mellitus   . GERD (gastroesophageal reflux disease) 12/21/2012  . HTN (hypertension) 10/15/2015  . Hyperglycemia, drug-induced    steroid induced hyperglycemia  . Migraine headache   . On home O2   . Pneumonia   . Recurrent upper respiratory infection (URI)   . Shortness of breath     Patient Active Problem List   Diagnosis Date Noted  . Pneumonia 05/30/2016  . SIRS (systemic inflammatory response syndrome) (HCC) 10/16/2015  . Sepsis (HCC) 10/15/2015  . Anxiety state 10/15/2015  . HTN (hypertension) 10/15/2015  . Hypokalemia 10/15/2015  . COPD with acute exacerbation (HCC) 08/14/2014  . Acute-on-chronic respiratory failure (HCC) 03/13/2014  . CAP (community acquired pneumonia) 03/11/2014  . Community acquired pneumonia 03/11/2014  . COPD exacerbation (HCC) 04/22/2013  . GERD (gastroesophageal reflux disease) 12/21/2012  . Constipation 12/21/2012  . Healthcare-associated pneumonia 02/03/2012  . Candida infection 11/26/2011  . Viral syndrome 11/21/2011  . DM (diabetes mellitus) (HCC) 11/21/2011  . Thrush, oral 07/03/2011  . Low back pain 06/16/2011  . Muscle spasms of lower extremity 06/16/2011  . TOBACCO ABUSE 09/12/2010  . 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 COPD (chronic obstructive pulmonary disease) (HCC) 02/14/2010  . UNSPECIFIED NUTRITIONAL DEFICIENCY 02/13/2010  . ADULT RESPIRATORY DISTRESS SYNDROME 02/13/2010  . DELIRIUM 02/13/2010    Past Surgical History:  Procedure Laterality Date  . c-section    . TRACHEOSTOMY     decannulated 12/2009  . TUBAL LIGATION    . uterine ablasion      OB History    No data available       Home Medications    Prior to Admission medications   Medication Sig Start Date End Date Taking? Authorizing Provider  acetaminophen (TYLENOL) 500 MG tablet Take 500 mg by mouth every 6 (six) hours as needed for fever.    Yes Historical Provider, MD  albuterol (PROAIR HFA) 108 (90 BASE) MCG/ACT inhaler Inhale 2 puffs into the lungs every 6 (six) hours as needed. For shortness of breath   Yes Historical Provider, MD  albuterol (PROVENTIL) (2.5 MG/3ML) 0.083% nebulizer solution Take 2.5 mg by nebulization every 4 (four) hours as needed. For shortness of breath  03/19/11  Yes Shyonna S Parrett, NP  budesonide-formoterol (SYMBICORT) 160-4.5 MCG/ACT inhaler Inhale 2 puffs into the lungs 2 (two) times daily.   Yes Historical Provider, MD  clonazePAM (KLONOPIN) 0.5 MG tablet Take 0.5 mg by mouth 3 (three) times daily.    Yes Historical Provider, MD  fluticasone (FLONASE) 50 MCG/ACT nasal spray Place 2 sprays into both nostrils daily. 09/21/16  Yes Historical Provider, MD  ibuprofen (ADVIL,MOTRIN) 200 MG tablet Take 400 mg by mouth every 6 (  six) hours as needed for fever or moderate pain.   Yes Historical Provider, MD  lisinopril (PRINIVIL,ZESTRIL) 20 MG tablet Take 20 mg by mouth daily.   Yes Historical Provider, MD  OxyCODONE (OXYCONTIN) 10 mg T12A Take 1 tablet (10 mg total) by mouth every 12 (twelve) hours. 04/24/13  Yes Kari BaarsEdward Hawkins, MD  oxyCODONE (ROXICODONE) 15 MG immediate release tablet Take 15 mg by mouth every 4 (four) hours.   Yes Historical Provider, MD  pantoprazole (PROTONIX) 40 MG tablet Take 40  mg by mouth daily.   Yes Historical Provider, MD  pregabalin (LYRICA) 50 MG capsule Take 50-100 mg by mouth every 8 (eight) hours. 1 twice daily and two in the evening   Yes Historical Provider, MD  promethazine (PHENERGAN) 25 MG tablet Take 25 mg by mouth every 6 (six) hours as needed for nausea.    Yes Historical Provider, MD  RESTASIS 0.05 % ophthalmic emulsion Place 1 drop into both eyes 2 (two) times daily. 09/28/15  Yes Historical Provider, MD  SUMAtriptan (IMITREX) 50 MG tablet Take 50 mg by mouth daily as needed for migraine or headache.  08/28/16  Yes Historical Provider, MD  tiotropium (SPIRIVA) 18 MCG inhalation capsule Place 18 mcg into inhaler and inhale daily.   Yes Historical Provider, MD  sulfamethoxazole-trimethoprim (BACTRIM) 400-80 MG tablet Take 1 tablet by mouth 2 (two) times daily. Patient not taking: Reported on 09/28/2016 06/01/16   Kari BaarsEdward Hawkins, MD    Family History Family History  Problem Relation Age of Onset  . Coronary artery disease Brother   . Diabetes Other   . Cancer Other   . Hypertension Other     Social History Social History  Substance Use Topics  . Smoking status: Current Every Day Smoker    Packs/day: 0.25    Years: 33.00    Types: Cigarettes  . Smokeless tobacco: Never Used     Comment: 1/2 pack a day  since age 89  . Alcohol use No     Allergies   Penicillins; Levaquin [levofloxacin in d5w]; and Morphine   Review of Systems Review of Systems  Reason unable to perform ROS: Urgent need for intervention.     Physical Exam Updated Vital Signs BP 124/82   Pulse 108   Temp 98.8 F (37.1 C) (Oral)   Resp 24   Ht 5\' 1"  (1.549 m)   Wt 132 lb (59.9 kg)   SpO2 91%   BMI 24.94 kg/m   Physical Exam  Constitutional: She is oriented to person, place, and time.  Pale, slightly dyspneic, does not look well  HENT:  Head: Normocephalic and atraumatic.  Eyes: Conjunctivae are normal.  Neck: Neck supple.  Cardiovascular: Normal rate and  regular rhythm.   Pulmonary/Chest:  Slight dyspnea and tachypnea  Abdominal: Soft. Bowel sounds are normal.  Musculoskeletal: Normal range of motion.  Neurological: She is alert and oriented to person, place, and time.  Skin: Skin is warm and dry.  Psychiatric: She has a normal mood and affect. Her behavior is normal.  Nursing note and vitals reviewed.    ED Treatments / Results  Labs (all labs ordered are listed, but only abnormal results are displayed) Labs Reviewed  LIPASE, BLOOD - Abnormal; Notable for the following:       Result Value   Lipase 10 (*)    All other components within normal limits  COMPREHENSIVE METABOLIC PANEL - Abnormal; Notable for the following:    Chloride 90 (*)  CO2 36 (*)    Glucose, Bld 200 (*)    Creatinine, Ser 0.38 (*)    Total Bilirubin 0.2 (*)    All other components within normal limits  CBC - Abnormal; Notable for the following:    WBC 20.1 (*)    All other components within normal limits  URINALYSIS, ROUTINE W REFLEX MICROSCOPIC (NOT AT [facility]) - Abnormal; Notable for the following:    Specific Gravity, Urine >1.030 (*)    Glucose, UA 250 (*)    Hgb urine dipstick MODERATE (*)    Ketones, ur TRACE (*)    Protein, ur 100 (*)    All other components within normal limits  URINE MICROSCOPIC-ADD ON - Abnormal; Notable for the following:    Squamous Epithelial / LPF 0-5 (*)    Bacteria, UA FEW (*)    All other components within normal limits    EKG  EKG Interpretation None       Radiology Dg Chest 2 View  Result Date: 09/28/2016 CLINICAL DATA:  Worsening productive cough with shortness of breath. EXAM: CHEST  2 VIEW COMPARISON:  05/30/2016 and 10/15/2015 FINDINGS: Again noted are prominent lung markings bilaterally, particularly at the lung bases. Similar findings on prior examinations and raises concern for chronic changes. Heart and mediastinum are within normal limits. There is no focal airspace disease or consolidation. No large  pleural effusions. No acute bone abnormality. IMPRESSION: Prominent lung markings, particularly in the lower lungs. Findings are similar to prior examination. Findings raise concern for chronic changes versus an atypical infectious process. Consider further characterization with chest CT. Electronically Signed   By: Richarda OverlieAdam  Henn M.D.   On: 09/28/2016 18:37   Ct Chest W Contrast  Result Date: 09/28/2016 CLINICAL DATA:  Shortness of breath and productive cough. Abnormal chest x-ray today. History of COPD and community acquired pneumonia. The patient was discharged from the hospital 06/01/2016 with a diagnosis of pneumonia. EXAM: CT CHEST WITH CONTRAST TECHNIQUE: Multidetector CT imaging of the chest was performed during intravenous contrast administration. CONTRAST:  75 ml ISOVUE-300 IOPAMIDOL (ISOVUE-300) INJECTION 61% COMPARISON:  PA and lateral chest earlier today and 01/29/2015. FINDINGS: Cardiovascular: Heart size is normal.  No pericardial effusion. Mediastinum/Nodes: There is mediastinal lymphadenopathy. Subcarinal node on image 70 measures 1.4 cm. Right precarinal node on image 56 measures 1.5 cm short axis dimension. AP window node on image 58 measures 1.3 cm short axis dimension. Left hilar node on image 80 measures 1.4 cm. Otherwise unremarkable. Lungs/Pleura: No pleural effusion. Lungs demonstrate some emphysematous disease. Extensive tree-in-bud opacities are present bilaterally and worst in the right lower lobe. No dominant nodule or mass is identified. No consolidative process. Mild peribronchial thickening is most conspicuous in the right middle lobe, lingula and lower lobes bilaterally. Upper Abdomen: Unremarkable. Musculoskeletal: No focal abnormality. IMPRESSION: Diffuse tree-in-bud pattern throughout the lungs is nonspecific but usually associated with infectious or inflammatory processes such as panbronchiolitis or atypical infection. Additional causes include connective tissue disorders and  neoplastic processes such as primary pulmonary lymphoma. Mediastinal and hilar lymphadenopathy is presumably reactive but cannot be definitively characterized. Electronically Signed   By: Drusilla Kannerhomas  Dalessio M.D.   On: 09/28/2016 21:39    Procedures Procedures (including critical care time)  Medications Ordered in ED Medications  cefTRIAXone (ROCEPHIN) 1 g in dextrose 5 % 50 mL IVPB (not administered)  azithromycin (ZITHROMAX) 500 mg in dextrose 5 % 250 mL IVPB (not administered)  sodium chloride 0.9 % bolus 1,000 mL (1,000 mLs Intravenous  New Bag/Given 09/28/16 1956)  ipratropium-albuterol (DUONEB) 0.5-2.5 (3) MG/3ML nebulizer solution 3 mL (3 mLs Nebulization Given 09/28/16 1959)  methylPREDNISolone sodium succinate (SOLU-MEDROL) 125 mg/2 mL injection 125 mg (125 mg Intravenous Given 09/28/16 1956)  morphine 4 MG/ML injection 4 mg (4 mg Intravenous Given 09/28/16 1956)  ondansetron (ZOFRAN) injection 4 mg (4 mg Intravenous Given 09/28/16 1956)  iopamidol (ISOVUE-300) 61 % injection 75 mL (75 mLs Intravenous Contrast Given 09/28/16 2046)  ondansetron (ZOFRAN) injection 4 mg (4 mg Intravenous Given 09/28/16 2123)     Initial Impression / Assessment and Plan / ED Course  I have reviewed the triage vital signs and the nursing notes.  Pertinent labs & imaging results that were available during my care of the patient were reviewed by me and considered in my medical decision making (see chart for details).  Clinical Course     Medically patient is developing a pneumonia. IV Zithromax, IV Rocephin, IV steroids, nebulizer treatment, IV fluids.  Admit to general medicine.   CRITICAL CARE Performed by: Donnetta Hutching  ?  Total critical care time: 30 minutes  Critical care time was exclusive of separately billable procedures and treating other patients.  Critical care was necessary to treat or prevent imminent or life-threatening deterioration.  Critical care was time spent personally by me on the  following activities: development of treatment plan with patient and/or surrogate as well as nursing, discussions with consultants, evaluation of patient's response to treatment, examination of patient, obtaining history from patient or surrogate, ordering and performing treatments and interventions, ordering and review of laboratory studies, ordering and review of radiographic studies, pulse oximetry and re-evaluation of patient's condition.  Final Clinical Impressions(s) / ED Diagnoses   Final diagnoses:  Community acquired pneumonia, unspecified laterality  Chronic obstructive pulmonary disease, unspecified COPD type (HCC)    New Prescriptions New Prescriptions   No medications on file     Donnetta Hutching, MD 09/28/16 2217

## 2016-09-28 NOTE — H&P (Signed)
History and Physical    [name] WJX:[id number] DOB: 16-Jan-1966 DOA: 09/28/2016  PCP: Fredirick Maudlin, MD   Patient coming from: Home  Chief Complaint: Subjective fevers, nausea, dyspnea, productive cough  HPI: [name] is a 50 y.o. female with medical history significant for COPD with chronic hypoxic respiratory failure, anxiety, chronic pain, GERD, and hypertension who presents the emergency department with several days of progressive dyspnea, increased cough with increased sputum production, nausea, and subjective fevers. Patient reports that she had been in her usual state until noting the insidious development of worsening dyspnea approximately 4 days ago. This was accompanied by some subjective fevers and chills and her cough became productive of thick green sputum. She has been taking alternating Advil and Tylenol for her symptoms and using her home neb treatments. She has not been on antibiotics or steroids recently. She denies any chest pain or palpitations, denies lower extremity edema or orthopnea, and denies recent long distance travel or sick contacts.   ED Course: Upon arrival to the ED, patient is found to be afebrile, saturating 91% on 2 Lpm, tachycardic in the 120s, and with vitals otherwise stable. Chest x-ray is notable for prominent lung markings, particularly in the lower lobes, consistent with atypical infection or worsening chronic lung disease. This was followed by CT of the chest demonstrates diffuse tree-in-bud pattern throughout suggestive panbronchiolitis or atypical infection, or alternatively possibly even connective tissue disease or neoplastic process. Chemistry panel featured a serum bicarbonate of 36, a little higher than recent priors. CBC is notable for a leukocytosis to 20,100. Patient was given 1 L of normal saline, nebulized breathing treatments, 125 mg IV Solu-Medrol, and empiric Rocephin and azithromycin. Tachycardia has nearly resolved with the IV  fluid, patient remains afebrile, and she will be admitted to the telemetry unit for ongoing evaluation and management of COPD with acute exacerbation suspected secondary to an atypical pneumonia.  Review of Systems:  All other systems reviewed and apart from HPI, are negative.  Past Medical History:  Diagnosis Date  . Angina   . Anxiety state 10/15/2015  . ARDS (adult respiratory distress syndrome) [facility])    Jan 2011  . Asthma   . Chronic back pain   . COPD (chronic obstructive pulmonary disease) (HCC)   . Diabetes mellitus   . GERD (gastroesophageal reflux disease) 12/21/2012  . HTN (hypertension) 10/15/2015  . Hyperglycemia, drug-induced    steroid induced hyperglycemia  . Migraine headache   . On home O2   . Pneumonia   . Recurrent upper respiratory infection (URI)   . Shortness of breath     Past Surgical History:  Procedure Laterality Date  . c-section    . TRACHEOSTOMY     decannulated 12/2009  . TUBAL LIGATION    . uterine ablasion       reports that she has been smoking Cigarettes.  She has a 8.25 pack-year smoking history. She has never used smokeless tobacco. She reports that she does not drink alcohol or use drugs.  Allergies  Allergen Reactions  . Penicillins Other (See Comments)    convulsions  . Levaquin [Levofloxacin In D5w] Itching and Swelling  . Morphine Nausea Only    Family History  Problem Relation Age of Onset  . Coronary artery disease Brother   . Diabetes Other   . Cancer Other   . Hypertension Other      Prior to Admission medications   Medication Sig Start Date End Date Taking? Authorizing Provider  acetaminophen (TYLENOL) 500 MG tablet Take 500 mg by mouth every 6 (six) hours as needed for fever.    Yes Historical Provider, MD  albuterol (PROAIR HFA) 108 (90 BASE) MCG/ACT inhaler Inhale 2 puffs into the lungs every 6 (six) hours as needed. For shortness of breath   Yes Historical Provider, MD  albuterol (PROVENTIL) (2.5 MG/3ML) 0.083%  nebulizer solution Take 2.5 mg by nebulization every 4 (four) hours as needed. For shortness of breath  03/19/11  Yes Vanetta S Parrett, NP  budesonide-formoterol (SYMBICORT) 160-4.5 MCG/ACT inhaler Inhale 2 puffs into the lungs 2 (two) times daily.   Yes Historical Provider, MD  clonazePAM (KLONOPIN) 0.5 MG tablet Take 0.5 mg by mouth 3 (three) times daily.    Yes Historical Provider, MD  fluticasone (FLONASE) 50 MCG/ACT nasal spray Place 2 sprays into both nostrils daily. 09/21/16  Yes Historical Provider, MD  ibuprofen (ADVIL,MOTRIN) 200 MG tablet Take 400 mg by mouth every 6 (six) hours as needed for fever or moderate pain.   Yes Historical Provider, MD  lisinopril (PRINIVIL,ZESTRIL) 20 MG tablet Take 20 mg by mouth daily.   Yes Historical Provider, MD  OxyCODONE (OXYCONTIN) 10 mg T12A Take 1 tablet (10 mg total) by mouth every 12 (twelve) hours. 04/24/13  Yes Kari BaarsEdward Hawkins, MD  oxyCODONE (ROXICODONE) 15 MG immediate release tablet Take 15 mg by mouth every 4 (four) hours.   Yes Historical Provider, MD  pantoprazole (PROTONIX) 40 MG tablet Take 40 mg by mouth daily.   Yes Historical Provider, MD  pregabalin (LYRICA) 50 MG capsule Take 50-100 mg by mouth every 8 (eight) hours. 1 twice daily and two in the evening   Yes Historical Provider, MD  promethazine (PHENERGAN) 25 MG tablet Take 25 mg by mouth every 6 (six) hours as needed for nausea.    Yes Historical Provider, MD  RESTASIS 0.05 % ophthalmic emulsion Place 1 drop into both eyes 2 (two) times daily. 09/28/15  Yes Historical Provider, MD  SUMAtriptan (IMITREX) 50 MG tablet Take 50 mg by mouth daily as needed for migraine or headache.  08/28/16  Yes Historical Provider, MD  tiotropium (SPIRIVA) 18 MCG inhalation capsule Place 18 mcg into inhaler and inhale daily.   Yes Historical Provider, MD  sulfamethoxazole-trimethoprim (BACTRIM) 400-80 MG tablet Take 1 tablet by mouth 2 (two) times daily. Patient not taking: Reported on 09/28/2016 06/01/16    Kari BaarsEdward Hawkins, MD    Physical Exam: Vitals:   09/28/16 2130 09/28/16 2136 09/28/16 2148 09/28/16 2200  BP: 128/64   124/82  Pulse: 110 111 108   Resp:      Temp:      TempSrc:      SpO2: 92% 92% 91%   Weight:      Height:          Constitutional: mild tachypnea, no accessory muscle use, appears uncomfortable Eyes: PERTLA, lids and conjunctivae normal ENMT: Mucous membranes are moist. Posterior pharynx clear of any exudate or lesions.   Neck: normal, supple, no masses, no thyromegaly Respiratory: Diminished bilaterally with scattered wheezes and rhonchi. Normal respiratory effort. No pallor or cyanosis.  Cardiovascular: Rate ~120 and regular. No extremity edema. No significant JVD. Abdomen: No distension, no tenderness, no masses palpated. Bowel sounds normal.  Musculoskeletal: no clubbing / cyanosis. No joint deformity upper and lower extremities. Normal muscle tone.  Skin: no significant rashes, lesions, ulcers. Warm, dry, well-perfused. Neurologic: CN 2-12 grossly intact. Sensation intact, DTR normal. Strength 5/5 in all 4 limbs.  Psychiatric: Normal judgment and insight. Alert and oriented x 3. Normal mood and affect.     Labs on Admission: I have personally reviewed following labs and imaging studies  CBC:  Recent Labs Lab 09/28/16 1323  WBC 20.1*  HGB 12.8  HCT 39.2  MCV 87.7  PLT 396   Basic Metabolic Panel:  Recent Labs Lab 09/28/16 1323  NA 135  K 4.0  CL 90*  CO2 36*  GLUCOSE 200*  BUN 9  CREATININE 0.38*  CALCIUM 9.2   GFR: Estimated Creatinine Clearance: 71.4 mL/min (by C-G formula based on SCr of 0.38 mg/dL (L)). Liver Function Tests:  Recent Labs Lab 09/28/16 1323  AST 28  ALT 15  ALKPHOS 88  BILITOT 0.2*  PROT 7.5  ALBUMIN 3.7    Recent Labs Lab 09/28/16 1323  LIPASE 10*   No results for input(s): AMMONIA in the last 168 hours. Coagulation Profile: No results for input(s): INR, PROTIME in the last 168 hours. Cardiac  Enzymes: No results for input(s): CKTOTAL, CKMB, CKMBINDEX, TROPONINI in the last 168 hours. BNP (last 3 results) No results for input(s): PROBNP in the last 8760 hours. HbA1C: No results for input(s): HGBA1C in the last 72 hours. CBG: No results for input(s): GLUCAP in the last 168 hours. Lipid Profile: No results for input(s): CHOL, HDL, LDLCALC, TRIG, CHOLHDL, LDLDIRECT in the last 72 hours. Thyroid Function Tests: No results for input(s): TSH, T4TOTAL, FREET4, T3FREE, THYROIDAB in the last 72 hours. Anemia Panel: No results for input(s): VITAMINB12, FOLATE, FERRITIN, TIBC, IRON, RETICCTPCT in the last 72 hours. Urine analysis:    Component Value Date/Time   COLORURINE YELLOW 09/28/2016 1257   APPEARANCEUR CLEAR 09/28/2016 1257   LABSPEC >1.030 (H) 09/28/2016 1257   PHURINE 6.0 09/28/2016 1257   GLUCOSEU 250 (A) 09/28/2016 1257   HGBUR MODERATE (A) 09/28/2016 1257   BILIRUBINUR NEGATIVE 09/28/2016 1257   KETONESUR TRACE (A) 09/28/2016 1257   PROTEINUR 100 (A) 09/28/2016 1257   UROBILINOGEN 1.0 07/24/2014 1605   NITRITE NEGATIVE 09/28/2016 1257   LEUKOCYTESUR NEGATIVE 09/28/2016 1257   Sepsis Labs: @LABRCNTIP (procalcitonin:4,lacticidven:4) )No results found for this or any previous visit (from the past 240 hour(s)).   Radiological Exams on Admission: Dg Chest 2 View  Result Date: 09/28/2016 CLINICAL DATA:  Worsening productive cough with shortness of breath. EXAM: CHEST  2 VIEW COMPARISON:  05/30/2016 and 10/15/2015 FINDINGS: Again noted are prominent lung markings bilaterally, particularly at the lung bases. Similar findings on prior examinations and raises concern for chronic changes. Heart and mediastinum are within normal limits. There is no focal airspace disease or consolidation. No large pleural effusions. No acute bone abnormality. IMPRESSION: Prominent lung markings, particularly in the lower lungs. Findings are similar to prior examination. Findings raise concern for  chronic changes versus an atypical infectious process. Consider further characterization with chest CT. Electronically Signed   By: Richarda OverlieAdam  Henn M.D.   On: 09/28/2016 18:37   Ct Chest W Contrast  Result Date: 09/28/2016 CLINICAL DATA:  Shortness of breath and productive cough. Abnormal chest x-ray today. History of COPD and community acquired pneumonia. The patient was discharged from the hospital 06/01/2016 with a diagnosis of pneumonia. EXAM: CT CHEST WITH CONTRAST TECHNIQUE: Multidetector CT imaging of the chest was performed during intravenous contrast administration. CONTRAST:  75 ml ISOVUE-300 IOPAMIDOL (ISOVUE-300) INJECTION 61% COMPARISON:  PA and lateral chest earlier today and 01/29/2015. FINDINGS: Cardiovascular: Heart size is normal.  No pericardial effusion. Mediastinum/Nodes: There is mediastinal lymphadenopathy. Subcarinal  node on image 70 measures 1.4 cm. Right precarinal node on image 56 measures 1.5 cm short axis dimension. AP window node on image 58 measures 1.3 cm short axis dimension. Left hilar node on image 80 measures 1.4 cm. Otherwise unremarkable. Lungs/Pleura: No pleural effusion. Lungs demonstrate some emphysematous disease. Extensive tree-in-bud opacities are present bilaterally and worst in the right lower lobe. No dominant nodule or mass is identified. No consolidative process. Mild peribronchial thickening is most conspicuous in the right middle lobe, lingula and lower lobes bilaterally. Upper Abdomen: Unremarkable. Musculoskeletal: No focal abnormality. IMPRESSION: Diffuse tree-in-bud pattern throughout the lungs is nonspecific but usually associated with infectious or inflammatory processes such as panbronchiolitis or atypical infection. Additional causes include connective tissue disorders and neoplastic processes such as primary pulmonary lymphoma. Mediastinal and hilar lymphadenopathy is presumably reactive but cannot be definitively characterized. Electronically Signed   By:  Drusilla Kanner M.D.   On: 09/28/2016 21:39    EKG: Ordered and pending.   Assessment/Plan  1. Atypical PNA  - Pt presents with 4 days of progressive dyspnea, subjective fevers, marked leukocytosis with no recent steroid  - Chest imaging, in light of the clinical presentation, is most suggestive of an atypical infection  - Sputum culture and gram stain will be obtained and she will be treated with empiric Rocephin and azithromycin   2. COPD with acute exacerbation, chronic hypoxic respiratory failure  - Likely precipitated by infection, which is being treated as above  - She reports using 2-3 Lpm supplemental oxygen atc at home and is oxygenating adequately with 3 Lpm on admission - Plan to continue scheduled ICS/LABA with Dulera, DuoNeb prn, IV Solu-Medrol, abx as above   3. Type II DM  - A1c is 7.4% in July 2017  - Serum glucose is 200 on admission  - She will be on systemic steroids - Check CBG with meals and qHS  - Start with a low-intensity SSI only and adjust prn    4. GERD - No EGD report on file  - Continue daily PPI  5. Hypertension - At goal currently  - Continue lisinopril as tolerated    6. Anxiety   - Appears stable on admission  - Continue Klonopin   7. Chronic pain - Stable, will continue her home regimen    DVT prophylaxis: sq Lovenox Code Status: Full   Family Communication: Significant other updated at bedside at patient's request Disposition Plan: Admit to telemetry Consults called: None Admission status: Inpatient    Briscoe Deutscher, MD [facility] Pager [phone]  If 7PM-7AM, please contact night-coverage [email or web address] Password [facility]  09/28/2016, 10:33 PM

## 2016-09-29 LAB — CBC WITH DIFFERENTIAL/PLATELET
Basophils Absolute: 0 10*3/uL (ref 0.0–0.1)
Basophils Relative: 0 %
EOS ABS: 0 10*3/uL (ref 0.0–0.7)
EOS PCT: 0 %
HCT: 36.5 % (ref 36.0–46.0)
Hemoglobin: 11.5 g/dL — ABNORMAL LOW (ref 12.0–15.0)
LYMPHS ABS: 0.6 10*3/uL — AB (ref 0.7–4.0)
LYMPHS PCT: 6 %
MCH: 28.2 pg (ref 26.0–34.0)
MCHC: 31.5 g/dL (ref 30.0–36.0)
MCV: 89.5 fL (ref 78.0–100.0)
MONO ABS: 0.2 10*3/uL (ref 0.1–1.0)
MONOS PCT: 1 %
Neutro Abs: 10 10*3/uL — ABNORMAL HIGH (ref 1.7–7.7)
Neutrophils Relative %: 93 %
PLATELETS: 363 10*3/uL (ref 150–400)
RBC: 4.08 MIL/uL (ref 3.87–5.11)
RDW: 12.2 % (ref 11.5–15.5)
WBC: 10.7 10*3/uL — ABNORMAL HIGH (ref 4.0–10.5)

## 2016-09-29 LAB — BASIC METABOLIC PANEL
Anion gap: 4 — ABNORMAL LOW (ref 5–15)
BUN: 8 mg/dL (ref 6–20)
CO2: 37 mmol/L — ABNORMAL HIGH (ref 22–32)
CREATININE: 0.33 mg/dL — AB (ref 0.44–1.00)
Calcium: 8.5 mg/dL — ABNORMAL LOW (ref 8.9–10.3)
Chloride: 97 mmol/L — ABNORMAL LOW (ref 101–111)
GFR calc Af Amer: >60 mL/min (ref >60)
GLUCOSE: 292 mg/dL — AB (ref 65–99)
POTASSIUM: 3.9 mmol/L (ref 3.5–5.1)
SODIUM: 138 mmol/L (ref 135–145)

## 2016-09-29 LAB — GLUCOSE, CAPILLARY
GLUCOSE-CAPILLARY: 256 mg/dL — AB (ref 65–99)
GLUCOSE-CAPILLARY: 261 mg/dL — AB (ref 65–99)
GLUCOSE-CAPILLARY: 302 mg/dL — AB (ref 65–99)
Glucose-Capillary: 285 mg/dL — ABNORMAL HIGH (ref 65–99)
Glucose-Capillary: 269 mg/dL — ABNORMAL HIGH (ref 65–99)

## 2016-09-29 LAB — STREP PNEUMONIAE URINARY ANTIGEN: Strep Pneumo Urinary Antigen: NEGATIVE

## 2016-09-29 MED ORDER — INFLUENZA VAC SPLIT QUAD 0.5 ML IM SUSY
0.5000 mL | PREFILLED_SYRINGE | INTRAMUSCULAR | Status: AC
  Administered 2016-09-30: 0.5 mL via INTRAMUSCULAR
  Filled 2016-09-29: qty 0.5

## 2016-09-29 MED ORDER — GLUCERNA SHAKE PO LIQD: 237.0000 mL | Freq: Three times a day (TID) | ORAL | Status: DC

## 2016-09-29 MED ORDER — GLUCERNA SHAKE PO LIQD
237.0000 mL | Freq: Two times a day (BID) | ORAL | Status: DC
  Administered 2016-09-29 – 2016-10-01 (×4): 237 mL via ORAL

## 2016-09-29 MED ORDER — PREGABALIN 50 MG PO CAPS
50.0000 mg | ORAL_CAPSULE | Freq: Two times a day (BID) | ORAL | Status: DC
  Administered 2016-09-29 – 2016-10-01 (×5): 50 mg via ORAL
  Filled 2016-09-29 (×5): qty 1

## 2016-09-29 MED ORDER — GUAIFENESIN-DM 100-10 MG/5ML PO SYRP
5.0000 mL | ORAL_SOLUTION | ORAL | Status: DC | PRN
  Administered 2016-09-29: 5 mL via ORAL
  Filled 2016-09-29: qty 5

## 2016-09-29 MED ORDER — SUMATRIPTAN SUCCINATE 50 MG PO TABS
50.0000 mg | ORAL_TABLET | Freq: Every day | ORAL | Status: DC | PRN
  Administered 2016-09-29: 50 mg via ORAL
  Filled 2016-09-29: qty 1
  Filled 2016-09-29: qty 2

## 2016-09-29 MED ORDER — PREGABALIN 50 MG PO CAPS
100.0000 mg | ORAL_CAPSULE | Freq: Every day | ORAL | Status: DC
  Administered 2016-09-29 – 2016-09-30 (×3): 100 mg via ORAL
  Filled 2016-09-29 (×3): qty 2

## 2016-09-29 NOTE — Evaluation (Signed)
Occupational Therapy Evaluation Patient Details Name: [name] MRN: [id number] DOB: 08/17/1966 Today's Date: 09/29/2016    History of Present Illness [name] is a [age] y.o. female with medical history significant for COPD with chronic hypoxic respiratory failure, anxiety, chronic pain, GERD, and hypertension who presents the emergency department with several days of progressive dyspnea, increased cough with increased sputum production, nausea, and subjective fevers. Patient reports that she had been in her usual state until noting the insidious development of worsening dyspnea approximately 4 days ago. This was accompanied by some subjective fevers and chills and her cough became productive of thick green sputum. She has been taking alternating Advil and Tylenol for her symptoms and using her home neb treatments. She has not been on antibiotics or steroids recently. She denies any chest pain or palpitations, denies lower extremity edema or orthopnea, and denies recent long distance travel or sick contacts.    Clinical Impression   Pt awake, alert, oriented x 4 this am, agreeable to OT evaluation. Pt reports she continues to have difficulty with SOB, pt on 3L O2. Pt demonstrates modified independence in ADL tasks, requires increased time and rest breaks due to SOB. Educated pt on energy conservation strategies to employ in the home, pt reports she already takes breaks as needed and tries not to push herself. Pt is at baseline with ADL and functional mobility, has family available to assist with ADLs as needed. No further OT services required at this time.     Follow Up Recommendations  No OT follow up    Equipment Recommendations  None recommended by OT       Precautions / Restrictions Precautions Precautions: None Restrictions Weight Bearing Restrictions: No      Mobility Bed Mobility Overal bed mobility: Modified Independent                Transfers Overall transfer  level: Modified independent Equipment used: None                       ADL Overall ADL's : Needs assistance/impaired;At baseline     Grooming: Wash/dry hands;Modified independent;Standing               Lower Body Dressing: Modified independent;Sitting/lateral leans   Toilet Transfer: Modified Independent;Regular Toilet;Grab bars   Toileting- Clothing Manipulation and Hygiene: Modified independent;Sit to/from stand       Functional mobility during ADLs: Supervision/safety       Vision Vision Assessment?: No apparent visual deficits          Pertinent Vitals/Pain Pain Assessment: No/denies pain     Hand Dominance Right   Extremity/Trunk Assessment Upper Extremity Assessment Upper Extremity Assessment: Generalized weakness   Lower Extremity Assessment Lower Extremity Assessment: Defer to PT evaluation       Communication Communication Communication: No difficulties   Cognition Arousal/Alertness: Awake/alert Behavior During Therapy: WFL for tasks assessed/performed Overall Cognitive Status: Within Functional Limits for tasks assessed                                Home Living Family/patient expects to be discharged to:: Private residence Living Arrangements: Spouse/significant other;Children Available Help at Discharge: Family;Available 24 hours/day Type of Home: House             Bathroom Shower/Tub: Walk-in Human resources officershower   Bathroom Toilet: Standard     Home Equipment: Shower seat;Hand held shower head  Prior Functioning/Environment Level of Independence: Needs assistance    ADL's / Homemaking Assistance Needed: Pt requires assistance with dressing, bathing, meal preparation on bad days. Daughters assist as needed            OT Problem List: Cardiopulmonary status limiting activity    End of Session Equipment Utilized During Treatment: Oxygen  Activity Tolerance: Patient tolerated treatment well Patient  left: in bed;with call bell/phone within reach   Time: 0850-0905 OT Time Calculation (min): 15 min Charges:  OT General Charges $OT Visit: 1 Procedure OT Evaluation $OT Eval Low Complexity: 1 Procedure  Ezra SitesLeslie Troxler, OTR/L  [phone] 09/29/2016, 9:07 AM

## 2016-09-29 NOTE — Progress Notes (Signed)
Subjective: She was admitted with pneumonia. She is on antibiotics. She is better she says but still has significant shortness of breath. She's coughing up purulent material. She's not having any chest pain. She had some confusion. No nausea vomiting or diarrhea  Objective: Vital signs in last 24 hours: Temp:  [98.4 F (36.9 C)-98.8 F (37.1 C)] 98.6 F (37 C) (11/07 0552) Pulse Rate:  [102-126] 102 (11/07 0552) Resp:  [20-24] 20 (11/07 0552) BP: (106-133)/(64-98) 120/65 (11/07 0552) SpO2:  [91 %-100 %] 93 % (11/07 0552) Weight:  [59.6 kg (131 lb 6.4 oz)-59.9 kg (132 lb)] 59.6 kg (131 lb 6.4 oz) (11/07 0014) Weight change:  Last BM Date: 09/28/16  Intake/Output from previous day: 11/06 0701 - 11/07 0700 In: 1050 [IV Piggyback:1050] Out: 400 [Stool:400]  PHYSICAL EXAM General appearance: alert, cooperative and mild distress Resp: rhonchi bilaterally Cardio: regular rate and rhythm, S1, S2 normal, no murmur, click, rub or gallop GI: soft, non-tender; bowel sounds normal; no masses,  no organomegaly Extremities: extremities normal, atraumatic, no cyanosis or edema Skin warm and dry. Mucous membranes are slightly dry as well  Lab Results:  Results for orders placed or performed during the hospital encounter of 09/28/16 (from the past 48 hour(s))  Urinalysis, Routine w reflex microscopic     Status: Abnormal   Collection Time: 09/28/16 12:57 PM  Result Value Ref Range   Color, Urine YELLOW YELLOW   APPearance CLEAR CLEAR   Specific Gravity, Urine >1.030 (H) 1.005 - 1.030   pH 6.0 5.0 - 8.0   Glucose, UA 250 (A) NEGATIVE mg/dL   Hgb urine dipstick MODERATE (A) NEGATIVE   Bilirubin Urine NEGATIVE NEGATIVE   Ketones, ur TRACE (A) NEGATIVE mg/dL   Protein, ur 100 (A) NEGATIVE mg/dL   Nitrite NEGATIVE NEGATIVE   Leukocytes, UA NEGATIVE NEGATIVE  Urine microscopic-add on     Status: Abnormal   Collection Time: 09/28/16 12:57 PM  Result Value Ref Range   Squamous Epithelial /  LPF 0-5 (A) NONE SEEN   WBC, UA 0-5 0 - 5 WBC/hpf   RBC / HPF 6-30 0 - 5 RBC/hpf   Bacteria, UA FEW (A) NONE SEEN   Urine-Other MUCOUS PRESENT   Lipase, blood     Status: Abnormal   Collection Time: 09/28/16  1:23 PM  Result Value Ref Range   Lipase 10 (L) 11 - 51 U/L  Comprehensive metabolic panel     Status: Abnormal   Collection Time: 09/28/16  1:23 PM  Result Value Ref Range   Sodium 135 135 - 145 mmol/L   Potassium 4.0 3.5 - 5.1 mmol/L   Chloride 90 (L) 101 - 111 mmol/L   CO2 36 (H) 22 - 32 mmol/L   Glucose, Bld 200 (H) 65 - 99 mg/dL   BUN 9 6 - 20 mg/dL   Creatinine, Ser 0.38 (L) 0.44 - 1.00 mg/dL   Calcium 9.2 8.9 - 10.3 mg/dL   Total Protein 7.5 6.5 - 8.1 g/dL   Albumin 3.7 3.5 - 5.0 g/dL   AST 28 15 - 41 U/L   ALT 15 14 - 54 U/L   Alkaline Phosphatase 88 38 - 126 U/L   Total Bilirubin 0.2 (L) 0.3 - 1.2 mg/dL   GFR calc non Af Amer >60 >60 mL/min   GFR calc Af Amer >60 >60 mL/min    Comment: (NOTE) The eGFR has been calculated using the CKD EPI equation. This calculation has not been validated in all clinical situations.  eGFR's persistently <60 mL/min signify possible Chronic Kidney Disease.    Anion gap 9 5 - 15  CBC     Status: Abnormal   Collection Time: 09/28/16  1:23 PM  Result Value Ref Range   WBC 20.1 (H) 4.0 - 10.5 K/uL   RBC 4.47 3.87 - 5.11 MIL/uL   Hemoglobin 12.8 12.0 - 15.0 g/dL   HCT 39.2 36.0 - 46.0 %   MCV 87.7 78.0 - 100.0 fL   MCH 28.6 26.0 - 34.0 pg   MCHC 32.7 30.0 - 36.0 g/dL   RDW 12.0 11.5 - 15.5 %   Platelets 396 150 - 400 K/uL  Glucose, capillary     Status: Abnormal   Collection Time: 09/29/16 12:22 AM  Result Value Ref Range   Glucose-Capillary 261 (H) 65 - 99 mg/dL   Comment 1 Notify RN    Comment 2 Document in Chart   CBC WITH DIFFERENTIAL     Status: Abnormal   Collection Time: 09/29/16  7:56 AM  Result Value Ref Range   WBC 10.7 (H) 4.0 - 10.5 K/uL   RBC 4.08 3.87 - 5.11 MIL/uL   Hemoglobin 11.5 (L) 12.0 - 15.0 g/dL    HCT 36.5 36.0 - 46.0 %   MCV 89.5 78.0 - 100.0 fL   MCH 28.2 26.0 - 34.0 pg   MCHC 31.5 30.0 - 36.0 g/dL   RDW 12.2 11.5 - 15.5 %   Platelets 363 150 - 400 K/uL   Neutrophils Relative % 93 %   Neutro Abs 10.0 (H) 1.7 - 7.7 K/uL   Lymphocytes Relative 6 %   Lymphs Abs 0.6 (L) 0.7 - 4.0 K/uL   Monocytes Relative 1 %   Monocytes Absolute 0.2 0.1 - 1.0 K/uL   Eosinophils Relative 0 %   Eosinophils Absolute 0.0 0.0 - 0.7 K/uL   Basophils Relative 0 %   Basophils Absolute 0.0 0.0 - 0.1 K/uL  Basic metabolic panel     Status: Abnormal   Collection Time: 09/29/16  7:56 AM  Result Value Ref Range   Sodium 138 135 - 145 mmol/L   Potassium 3.9 3.5 - 5.1 mmol/L   Chloride 97 (L) 101 - 111 mmol/L   CO2 37 (H) 22 - 32 mmol/L   Glucose, Bld 292 (H) 65 - 99 mg/dL   BUN 8 6 - 20 mg/dL   Creatinine, Ser 0.33 (L) 0.44 - 1.00 mg/dL   Calcium 8.5 (L) 8.9 - 10.3 mg/dL   GFR calc non Af Amer >60 >60 mL/min   GFR calc Af Amer >60 >60 mL/min    Comment: (NOTE) The eGFR has been calculated using the CKD EPI equation. This calculation has not been validated in all clinical situations. eGFR's persistently <60 mL/min signify possible Chronic Kidney Disease.    Anion gap 4 (L) 5 - 15  Glucose, capillary     Status: Abnormal   Collection Time: 09/29/16  8:00 AM  Result Value Ref Range   Glucose-Capillary 302 (H) 65 - 99 mg/dL   Comment 1 Notify RN    Comment 2 Document in Chart     ABGS No results for input(s): PHART, PO2ART, TCO2, HCO3 in the last 72 hours.  Invalid input(s): PCO2 CULTURES No results found for this or any previous visit (from the past 240 hour(s)). Studies/Results: Dg Chest 2 View  Result Date: 09/28/2016 CLINICAL DATA:  Worsening productive cough with shortness of breath. EXAM: CHEST  2 VIEW COMPARISON:  05/30/2016 and  10/15/2015 FINDINGS: Again noted are prominent lung markings bilaterally, particularly at the lung bases. Similar findings on prior examinations and raises  concern for chronic changes. Heart and mediastinum are within normal limits. There is no focal airspace disease or consolidation. No large pleural effusions. No acute bone abnormality. IMPRESSION: Prominent lung markings, particularly in the lower lungs. Findings are similar to prior examination. Findings raise concern for chronic changes versus an atypical infectious process. Consider further characterization with chest CT. Electronically Signed   By: Markus Daft M.D.   On: 09/28/2016 18:37   Ct Chest W Contrast  Result Date: 09/28/2016 CLINICAL DATA:  Shortness of breath and productive cough. Abnormal chest x-ray today. History of COPD and community acquired pneumonia. The patient was discharged from the hospital 06/01/2016 with a diagnosis of pneumonia. EXAM: CT CHEST WITH CONTRAST TECHNIQUE: Multidetector CT imaging of the chest was performed during intravenous contrast administration. CONTRAST:  75 ml ISOVUE-300 IOPAMIDOL (ISOVUE-300) INJECTION 61% COMPARISON:  PA and lateral chest earlier today and 01/29/2015. FINDINGS: Cardiovascular: Heart size is normal.  No pericardial effusion. Mediastinum/Nodes: There is mediastinal lymphadenopathy. Subcarinal node on image 70 measures 1.4 cm. Right precarinal node on image 56 measures 1.5 cm short axis dimension. AP window node on image 58 measures 1.3 cm short axis dimension. Left hilar node on image 80 measures 1.4 cm. Otherwise unremarkable. Lungs/Pleura: No pleural effusion. Lungs demonstrate some emphysematous disease. Extensive tree-in-bud opacities are present bilaterally and worst in the right lower lobe. No dominant nodule or mass is identified. No consolidative process. Mild peribronchial thickening is most conspicuous in the right middle lobe, lingula and lower lobes bilaterally. Upper Abdomen: Unremarkable. Musculoskeletal: No focal abnormality. IMPRESSION: Diffuse tree-in-bud pattern throughout the lungs is nonspecific but usually associated with  infectious or inflammatory processes such as panbronchiolitis or atypical infection. Additional causes include connective tissue disorders and neoplastic processes such as primary pulmonary lymphoma. Mediastinal and hilar lymphadenopathy is presumably reactive but cannot be definitively characterized. Electronically Signed   By: Inge Rise M.D.   On: 09/28/2016 21:39    Medications:  Prior to Admission:  Prescriptions Prior to Admission  Medication Sig Dispense Refill Last Dose  . acetaminophen (TYLENOL) 500 MG tablet Take 500 mg by mouth every 6 (six) hours as needed for fever.    09/28/2016 at Unknown time  . albuterol (PROAIR HFA) 108 (90 BASE) MCG/ACT inhaler Inhale 2 puffs into the lungs every 6 (six) hours as needed. For shortness of breath   09/28/2016 at Unknown time  . albuterol (PROVENTIL) (2.5 MG/3ML) 0.083% nebulizer solution Take 2.5 mg by nebulization every 4 (four) hours as needed. For shortness of breath    Past Week at Unknown time  . budesonide-formoterol (SYMBICORT) 160-4.5 MCG/ACT inhaler Inhale 2 puffs into the lungs 2 (two) times daily.   09/28/2016 at Unknown time  . clonazePAM (KLONOPIN) 0.5 MG tablet Take 0.5 mg by mouth 3 (three) times daily.    09/27/2016 at Unknown time  . fluticasone (FLONASE) 50 MCG/ACT nasal spray Place 2 sprays into both nostrils daily.  9 Past Week at Unknown time  . ibuprofen (ADVIL,MOTRIN) 200 MG tablet Take 400 mg by mouth every 6 (six) hours as needed for fever or moderate pain.   09/27/2016 at Unknown time  . lisinopril (PRINIVIL,ZESTRIL) 20 MG tablet Take 20 mg by mouth daily.   09/27/2016 at Unknown time  . OxyCODONE (OXYCONTIN) 10 mg T12A Take 1 tablet (10 mg total) by mouth every 12 (twelve) hours. 60 tablet  0 09/28/2016 at Unknown time  . oxyCODONE (ROXICODONE) 15 MG immediate release tablet Take 15 mg by mouth every 4 (four) hours.   09/28/2016 at Unknown time  . pantoprazole (PROTONIX) 40 MG tablet Take 40 mg by mouth daily.   09/27/2016 at  Unknown time  . pregabalin (LYRICA) 50 MG capsule Take 50-100 mg by mouth every 8 (eight) hours. 1 twice daily and two in the evening   09/27/2016 at Unknown time  . promethazine (PHENERGAN) 25 MG tablet Take 25 mg by mouth every 6 (six) hours as needed for nausea.    09/28/2016 at Unknown time  . RESTASIS 0.05 % ophthalmic emulsion Place 1 drop into both eyes 2 (two) times daily.  3 09/28/2016 at Unknown time  . SUMAtriptan (IMITREX) 50 MG tablet Take 50 mg by mouth daily as needed for migraine or headache.   12 unknown  . tiotropium (SPIRIVA) 18 MCG inhalation capsule Place 18 mcg into inhaler and inhale daily.   09/27/2016 at Unknown time  . sulfamethoxazole-trimethoprim (BACTRIM) 400-80 MG tablet Take 1 tablet by mouth 2 (two) times daily. (Patient not taking: Reported on 09/28/2016) 20 tablet 0 Not Taking at Unknown time   Scheduled: . azithromycin  500 mg Oral Q24H  . cefTRIAXone (ROCEPHIN)  IV  1 g Intravenous Q24H  . clonazePAM  0.5 mg Oral TID  . cycloSPORINE  1 drop Both Eyes BID  . enoxaparin (LOVENOX) injection  40 mg Subcutaneous Q24H  . fluticasone  2 spray Each Nare Daily  . [START ON 09/30/2016] Influenza vac split quadrivalent PF  0.5 mL Intramuscular Tomorrow-1000  . insulin aspart  0-5 Units Subcutaneous QHS  . insulin aspart  0-9 Units Subcutaneous TID WC  . lisinopril  20 mg Oral Daily  . methylPREDNISolone (SOLU-MEDROL) injection  60 mg Intravenous Q6H  . mometasone-formoterol  2 puff Inhalation BID  . oxyCODONE  10 mg Oral Q12H  . pantoprazole  40 mg Oral Daily  . pregabalin  100 mg Oral QHS  . pregabalin  50 mg Oral BID WC   Continuous:  MIW:[id number], guaiFENesin-dextromethorphan, ibuprofen, ipratropium-albuterol, oxyCODONE, promethazine, SUMAtriptan  Assesment: She is admitted with pneumonia. She may have something like Mycobacterium avium based on her CT. She has severe COPD at baseline. Principal Problem:   Atypical pneumonia Active Problems:   DM (diabetes  mellitus) (HCC)   GERD (gastroesophageal reflux disease)   CAP (community acquired pneumonia)   COPD with acute exacerbation ([place])   Anxiety state   HTN (hypertension)    Plan: Continue current treatments. See what sort of response she has before we initiate workup for mycobacterial disease    LOS: 1 day   Sayra Frisby L 09/29/2016, 9:04 AM

## 2016-09-29 NOTE — Clinical Social Work Note (Signed)
CSW received consult for COPD gold protocol. Pt has not met criteria of 3 or more admissions in past 6 months. CSW will sign off.  [name], [place]

## 2016-09-29 NOTE — Progress Notes (Signed)
Notified MD, pt requesting something for a cough. Awaiting response.

## 2016-09-29 NOTE — Evaluation (Signed)
Physical Therapy Evaluation Patient Details Name: Yevonne Alineammy A Swilling MRN: [id number] DOB: 04/01/1966 Today's Date: 09/29/2016   History of Present Illness  [name] is a 50 y.o. female with medical history significant for COPD with chronic hypoxic respiratory failure, anxiety, chronic pain, GERD, and hypertension who presents the emergency department with several days of progressive dyspnea, increased cough with increased sputum production, nausea, and subjective fevers. Patient reports that she had been in her usual state until noting the insidious development of worsening dyspnea approximately 4 days ago. This was accompanied by some subjective fevers and chills and her cough became productive of thick green sputum. She has been taking alternating Advil and Tylenol for her symptoms and using her home neb treatments. She has not been on antibiotics or steroids recently. She denies any chest pain or palpitations, denies lower extremity edema or orthopnea, and denies recent long distance travel or sick contacts.   Clinical Impression  Pt received in bed, asleep, but was agreeable to PT evaluation.  Pt expressed that she was independent with ambulation at home, and independent with ADL's, but her dtr's assist her with meal prep.  During PT evaluation pt ambulated 54300ft with no AD, and was able to manage her O2 tank independently.  Pt expressed that she feels like she is moving a little slower than normal.  Pt encouraged to mobilize while she is in acute care setting, and then increase her activity once she returns home.  Due to pt being independent or modified independent with all functional mobility, she does not demonstrate need for skilled PT at this time, and PT will sign off.     Follow Up Recommendations No PT follow up    Equipment Recommendations  None recommended by PT    Recommendations for Other Services       Precautions / Restrictions Precautions Precautions: Fall Precaution Comments:  fell a few days ago - trying to get up to go to the bathroom, and feet slipped out from under her.   Restrictions Weight Bearing Restrictions: No      Mobility  Bed Mobility Overal bed mobility: Modified Independent                Transfers Overall transfer level: Modified independent                  Ambulation/Gait Ambulation/Gait assistance: Independent Ambulation Distance (Feet): 500 Feet Assistive device: None Gait Pattern/deviations: WFL(Within Functional Limits)     General Gait Details: Pt able to negotiate portable O2 tank  Stairs            Wheelchair Mobility    Modified Rankin (Stroke Patients Only)       Balance Overall balance assessment: Independent;No apparent balance deficits (not formally assessed)                                           Pertinent Vitals/Pain Pain Assessment: 0-10 Pain Score: 8  Pain Location: back, and lungs.  Pain Descriptors / Indicators: Burning;Aching Pain Intervention(s): Limited activity within patient's tolerance;Monitored during session;Repositioned    Home Living   Living Arrangements: Spouse/significant other;Children (2 dtr's, 2 grandchildren, step son, an fiance. ) Available Help at Discharge: Available 24 hours/day Type of Home: Mobile home Home Access: Stairs to enter   Entrance Stairs-Number of Steps: 2 in the front and 4-5 on the side Home Layout:  One level Home Equipment: Shower seat;Hand held shower head      Prior Function Level of Independence: Needs assistance      ADL's / Homemaking Assistance Needed: independent with dressing, bathing.  dtrs to assist with meal prep, not driving.          Hand Dominance   Dominant Hand: Right    Extremity/Trunk Assessment   Upper Extremity Assessment: Overall WFL for tasks assessed           Lower Extremity Assessment: Overall WFL for tasks assessed         Communication   Communication: No difficulties   Cognition Arousal/Alertness: Awake/alert Behavior During Therapy: WFL for tasks assessed/performed Overall Cognitive Status: Within Functional Limits for tasks assessed                      General Comments      Exercises     Assessment/Plan    PT Assessment Patent does not need any further PT services  PT Problem List            PT Treatment Interventions      PT Goals (Current goals can be found in the Care Plan section)  Acute Rehab PT Goals PT Goal Formulation: All assessment and education complete, DC therapy    Frequency     Barriers to discharge        Co-evaluation               End of Session Equipment Utilized During Treatment: Gait belt;Oxygen Activity Tolerance: Patient tolerated treatment well Patient left: in chair;with call bell/phone within reach      Functional Assessment Tool Used: [employer] AM-PAC "6-clicks"  Mobility: Walking and Moving Around Current Status [phone](G8978): At least 1 percent but less than 20 percent impaired, limited or restricted Mobility: Walking and Moving Around Goal Status [phone](G8979): At least 1 percent but less than 20 percent impaired, limited or restricted Mobility: Walking and Moving Around Discharge Status [phone](G8980): At least 1 percent but less than 20 percent impaired, limited or restricted    Time: 1455-1522 PT Time Calculation (min) (ACUTE ONLY): 27 min   Charges:   PT Evaluation $PT Eval Low Complexity: 1 Procedure PT Treatments $Gait Training: 8-22 mins   PT G Codes:   PT G-Codes **NOT FOR INPATIENT CLASS** Functional Assessment Tool Used: [employer] AM-PAC "6-clicks"  Mobility: Walking and Moving Around Current Status [phone](G8978): At least 1 percent but less than 20 percent impaired, limited or restricted Mobility: Walking and Moving Around Goal Status [phone](G8979): At least 1 percent but less than 20 percent impaired, limited or restricted Mobility: Walking and Moving Around Discharge Status  [phone](G8980): At least 1 percent but less than 20 percent impaired, limited or restricted    Beth Othniel Maret, PT, DPT X: [phone]

## 2016-09-29 NOTE — Progress Notes (Signed)
Initial Nutrition Assessment  DOCUMENTATION CODES:  Not applicable  INTERVENTION:  Glucerna Shake po BID, each supplement provides 220 kcal and 10 grams of protein  Pt reports that she is constipated and takes a stool softener at baseline. Consider beginning bowel regimen.   Took meal/food preferences. Asked for dietary assistance with providing more options to patient.   NUTRITION DIAGNOSIS:  Increased nutrient needs related to acute illness and underlying chronic illness as evidenced by estimated nutritional requirements for these conditions and treatments.   GOAL:  Patient will meet greater than or equal to 90% of their needs  MONITOR:  PO intake, Supplement acceptance, Labs, Weight trends, I & O's  REASON FOR ASSESSMENT:  Consult COPD Protocol  ASSESSMENT:  50 y/o female PMHx COPD, Chronic Resp failure, anxiety, chronic pain, GERD, HTN, DM (pt report she doesn't have it). Presented with several days worsening dyspnea, subjective fever, nausea. Found to have atypical PNA. Admitted for eval and management of PNA and COPD exacerbation.  Pt reports she has had poor PO intake for ~ 1 week secondary to dyspnea, poor appetite and nausea. She has chronic constipation as well and takes a stool softener at baseline. She did not take any vitamins, minerals, or oral nutritional supplements at home. She did not follow a DM diet and is adamant she does not have this, stating her high BG is only related to the medications. Noted she has had 2 A1C measurements within last year that met DM criteria.   She gives UBW as 142 lbs and states she was weighed this at an office appointment 3 months ago. There is a documented weight of 140 lbs in July, but this appears to be a reported weight.   She is vocal about not liking the food provided by the hospital. She does not like being on a therapeutic diet. RD went through some foods to determine what she may like to have. Will ask dietary to go over all  options with patient. She was agreeable to Ensure Enlive, but given her hyperglycemia, will order Glucerna for now.   NFPE: Not done  Meds:IV ABx, methylprednisolone, ppi,  Labs: Hyperglycemia (250-300 mg/dl), WBC:10.7,    Recent Labs Lab 09/28/16 1323 09/29/16 0756  NA 135 138  K 4.0 3.9  CL 90* 97*  CO2 36* 37*  BUN 9 8  CREATININE 0.38* 0.33*  CALCIUM 9.2 8.5*  GLUCOSE 200* 292*   Diet Order:  Diet Carb Modified Fluid consistency: Thin; Room service appropriate? Yes  Skin:  Reviewed, no issues  Last BM:  11/6  Height:  Ht Readings from Last 1 Encounters:  09/28/16 5' 1"  (1.549 m)   Weight:  Wt Readings from Last 1 Encounters:  09/29/16 131 lb 6.4 oz (59.6 kg)   Wt Readings from Last 10 Encounters:  09/29/16 131 lb 6.4 oz (59.6 kg)  05/30/16 132 lb (59.9 kg)  10/19/15 137 lb 2 oz (62.2 kg)  08/15/14 141 lb 5 oz (64.1 kg)  07/24/14 152 lb (68.9 kg)  03/11/14 152 lb (68.9 kg)  06/22/13 157 lb (71.2 kg)  04/23/13 172 lb 2.9 oz (78.1 kg)  12/21/12 168 lb 6.4 oz (76.4 kg)  11/18/12 179 lb (81.2 kg)   Ideal Body Weight:  47.73 kg  BMI:  Body mass index is 24.83 kg/m.  Estimated Nutritional Needs:  Kcal:  1500-1750 (25-30 kcal/kg bw) Protein:  72-83 g (1.2-1.4 g/kg bw) Fluid:  >2.1 L (35 ml/kg bw)  EDUCATION NEEDS:  Education needs no  appropriate at this time  Burtis Junes RD, LDN, [facility] Pager: [phone] 09/29/2016 2:37 PM

## 2016-09-30 LAB — EXPECTORATED SPUTUM ASSESSMENT W REFEX TO RESP CULTURE

## 2016-09-30 LAB — CBC WITH DIFFERENTIAL/PLATELET
BASOS PCT: 0 %
Basophils Absolute: 0 10*3/uL (ref 0.0–0.1)
EOS PCT: 0 %
Eosinophils Absolute: 0 10*3/uL (ref 0.0–0.7)
HCT: 35.1 % — ABNORMAL LOW (ref 36.0–46.0)
Hemoglobin: 11 g/dL — ABNORMAL LOW (ref 12.0–15.0)
LYMPHS ABS: 0.9 10*3/uL (ref 0.7–4.0)
Lymphocytes Relative: 5 %
MCH: 28.3 pg (ref 26.0–34.0)
MCHC: 31.3 g/dL (ref 30.0–36.0)
MCV: 90.2 fL (ref 78.0–100.0)
MONO ABS: 0.7 10*3/uL (ref 0.1–1.0)
Monocytes Relative: 4 %
NEUTROS ABS: 16.4 10*3/uL — AB (ref 1.7–7.7)
Neutrophils Relative %: 91 %
PLATELETS: 384 10*3/uL (ref 150–400)
RBC: 3.89 MIL/uL (ref 3.87–5.11)
RDW: 12.2 % (ref 11.5–15.5)
WBC: 18 10*3/uL — ABNORMAL HIGH (ref 4.0–10.5)

## 2016-09-30 LAB — GLUCOSE, CAPILLARY
Glucose-Capillary: 301 mg/dL — ABNORMAL HIGH (ref 65–99)
Glucose-Capillary: 315 mg/dL — ABNORMAL HIGH (ref 65–99)
Glucose-Capillary: 191 mg/dL — ABNORMAL HIGH (ref 65–99)
Glucose-Capillary: 282 mg/dL — ABNORMAL HIGH (ref 65–99)

## 2016-09-30 LAB — EXPECTORATED SPUTUM ASSESSMENT W GRAM STAIN, RFLX TO RESP C

## 2016-09-30 LAB — HEMOGLOBIN A1C
Hgb A1c MFr Bld: 7.1 % — ABNORMAL HIGH (ref 4.8–5.6)
Mean Plasma Glucose: 157 mg/dL

## 2016-09-30 LAB — HIV ANTIBODY (ROUTINE TESTING W REFLEX): HIV SCREEN 4TH GENERATION: NONREACTIVE

## 2016-09-30 MED ORDER — INSULIN ASPART 100 UNIT/ML ~~LOC~~ SOLN
3.0000 [IU] | Freq: Three times a day (TID) | SUBCUTANEOUS | Status: DC
  Administered 2016-09-30 – 2016-10-01 (×3): 3 [IU] via SUBCUTANEOUS

## 2016-09-30 MED ORDER — INSULIN GLARGINE 100 UNIT/ML ~~LOC~~ SOLN
6.0000 [IU] | Freq: Every day | SUBCUTANEOUS | Status: DC
  Administered 2016-09-30 – 2016-10-01 (×2): 6 [IU] via SUBCUTANEOUS
  Filled 2016-09-30 (×3): qty 0.06

## 2016-09-30 NOTE — Progress Notes (Signed)
ANTIBIOTIC CONSULT NOTE  Pharmacy Consult for Renal Adjustment of ABX if needed ABX:  Rocephin and Zithromax  Allergies  Allergen Reactions  . Penicillins Other (See Comments)    convulsions  . Levaquin [Levofloxacin In D5w] Itching and Swelling  . Morphine Nausea Only   Patient Measurements: Height: 5\' 1"  (154.9 cm) Weight: 131 lb 6.4 oz (59.6 kg) IBW/kg (Calculated) : 47.8  Vital Signs: Temp: 98.5 F (36.9 C) (11/08 0533) Temp Source: Oral (11/08 0533) BP: 109/54 (11/08 0533) Pulse Rate: 93 (11/08 0533)  Labs:  Recent Labs  09/28/16 1323 09/29/16 0756 09/30/16 0647  WBC 20.1* 10.7* 18.0*  HGB 12.8 11.5* 11.0*  PLT 396 363 384  CREATININE 0.38* 0.33*  --    No results for input(s): VANCOTROUGH, VANCOPEAK, VANCORANDOM, GENTTROUGH, GENTPEAK, GENTRANDOM, TOBRATROUGH, TOBRAPEAK, TOBRARND, AMIKACINPEAK, AMIKACINTROU, AMIKACIN in the last 72 hours.   Medical History: Past Medical History:  Diagnosis Date  . Angina   . Anxiety state 10/15/2015  . ARDS (adult respiratory distress syndrome) [facility](HCC)    Jan 2011  . Asthma   . Chronic back pain   . COPD (chronic obstructive pulmonary disease) (HCC)   . Diabetes mellitus   . GERD (gastroesophageal reflux disease) 12/21/2012  . HTN (hypertension) 10/15/2015  . Hyperglycemia, drug-induced    steroid induced hyperglycemia  . Migraine headache   . On home O2   . Pneumonia   . Recurrent upper respiratory infection (URI)   . Shortness of breath    Anti-infectives    Start     Dose/Rate Route Frequency Ordered Stop   09/29/16 2300  azithromycin (ZITHROMAX) tablet 500 mg     500 mg Oral Every 24 hours 09/28/16 2233 10/06/16 2259   09/29/16 2215  cefTRIAXone (ROCEPHIN) 1 g in dextrose 5 % 50 mL IVPB     1 g 100 mL/hr over 30 Minutes Intravenous Every 24 hours 09/28/16 2233 10/06/16 2214   09/28/16 2215  cefTRIAXone (ROCEPHIN) 1 g in dextrose 5 % 50 mL IVPB     1 g 100 mL/hr over 30 Minutes Intravenous  Once 09/28/16 2207  09/28/16 2250   09/28/16 2215  azithromycin (ZITHROMAX) 500 mg in dextrose 5 % 250 mL IVPB  Status:  Discontinued     500 mg 250 mL/hr over 60 Minutes Intravenous Every 24 hours 09/28/16 2207 09/29/16 0018     Assessment: [name] started on Rocephin and Zithromax.  No renal adjustment needed.  Estimated Creatinine Clearance: 71.3 mL/min (by C-G formula based on SCr of 0.33 mg/dL (L)).  Plan: Continue current Rx Monitor labs, progress, c/s  Wayland DenisHall, Landyn Lorincz A, [facility] CenterRPH 09/30/2016

## 2016-09-30 NOTE — Progress Notes (Signed)
Inpatient Diabetes Program Recommendations  AACE/ADA: New Consensus Statement on Inpatient Glycemic Control (2015)  Target Ranges:  Prepandial:   less than 140 mg/dL      Peak postprandial:   less than 180 mg/dL (1-2 hours)      Critically ill patients:  140 - 180 mg/dL   Results for Choate, [name] (MRN [id number]) as of 09/30/2016 07:52  Ref. Range 09/29/2016 00:22 09/29/2016 08:00 09/29/2016 12:12 09/29/2016 16:48 09/29/2016 21:29  Glucose-Capillary Latest Ref Range: 65 - 99 mg/dL 725261 (H) 366302 (H) 440256 (H) 285 (H) 269 (H)    Review of Glycemic Control  Diabetes history: DM2 Outpatient Diabetes medications: None Current orders for Inpatient glycemic control: Novolog 0-9 units TID with meals, Novolog 0-5 units QHS  Inpatient Diabetes Program Recommendations: Insulin - Basal: If steroids are continued, please consider ordering Lantus 6 units Q24H starting now (based on 59 kg x 0.1 units). Insulin - Meal Coverage: If steroids are continued, please consider ordering Novolog 3 units TID with meals for meal coverage if patient is eating at least 50% of meals.  Thanks, Orlando PennerMarie Shaquil Aldana, RN, MSN, CDE Diabetes Coordinator Inpatient Diabetes Program [phone] (Team Pager from 8am to 5pm)

## 2016-09-30 NOTE — Care Management Note (Signed)
Case Management Note  Patient Details  Name: [name] MRN: [id number] Date of Birth: 01/19/1966  Subjective/Objective:   Patient adm from home with atypical pneumonia. She is ind with ADL's. Has home oxygen PTA. She has a PCP, transportation, and medicaid, no issues obtaining medications.                  Action/Plan: Anticipate DC home with self care. No CM needs known.   Expected Discharge Date:      10/01/2016            Expected Discharge Plan:  Home/Self Care  In-House Referral:  NA  Discharge planning Services  CM Consult  Post Acute Care Choice:  NA Choice offered to:  NA  DME Arranged:    DME Agency:     HH Arranged:    HH Agency:     Status of Service:  Completed, signed off  If discussed at [employer] Length of Stay Meetings, dates discussed:    Additional Comments:  Dorsel Flinn, Chrystine OilerSharley Diane, RN 09/30/2016, 9:54 AM

## 2016-09-30 NOTE — Progress Notes (Signed)
Subjective: She is awake and alert. She is still short of breath. Still coughing. She still feels bad. No other new complaints. No nausea or vomiting. She is producing sputum. We discussed her CT findings and I told her that she may have a problem like Mycobacterium avium intracellulare. While she is here I'll go ahead and get sputum but I don't intend to start her on treatment.blood sugars are elevated.  Objective: Vital signs in last 24 hours: Temp:  [98.4 F (36.9 C)-98.6 F (37 C)] 98.5 F (36.9 C) (11/08 0533) Pulse Rate:  [91-106] 93 (11/08 0533) Resp:  [20] 20 (11/08 0533) BP: (109-136)/(54-84) 109/54 (11/08 0533) SpO2:  [65 %-100 %] 100 % (11/08 0828) Weight change:  Last BM Date: 09/28/16  Intake/Output from previous day: 11/07 0701 - 11/08 0700 In: 550 [P.O.:500; IV Piggyback:50] Out: 800 [Urine:800]  PHYSICAL EXAM General appearance: alert, cooperative and mild distress Resp: wheezes bilaterally Cardio: regular rate and rhythm, S1, S2 normal, no murmur, click, rub or gallop GI: soft, non-tender; bowel sounds normal; no masses,  no organomegaly Extremities: extremities normal, atraumatic, no cyanosis or edema Skin warm and dry. Pupils reactive. Nose and throat clear  Lab Results:  Results for orders placed or performed during the hospital encounter of 09/28/16 (from the past 48 hour(s))  Urinalysis, Routine w reflex microscopic     Status: Abnormal   Collection Time: 09/28/16 12:57 PM  Result Value Ref Range   Color, Urine YELLOW YELLOW   APPearance CLEAR CLEAR   Specific Gravity, Urine >1.030 (H) 1.005 - 1.030   pH 6.0 5.0 - 8.0   Glucose, UA 250 (A) NEGATIVE mg/dL   Hgb urine dipstick MODERATE (A) NEGATIVE   Bilirubin Urine NEGATIVE NEGATIVE   Ketones, ur TRACE (A) NEGATIVE mg/dL   Protein, ur 100 (A) NEGATIVE mg/dL   Nitrite NEGATIVE NEGATIVE   Leukocytes, UA NEGATIVE NEGATIVE  Urine microscopic-add on     Status: Abnormal   Collection Time: 09/28/16 12:57  PM  Result Value Ref Range   Squamous Epithelial / LPF 0-5 (A) NONE SEEN   WBC, UA 0-5 0 - 5 WBC/hpf   RBC / HPF 6-30 0 - 5 RBC/hpf   Bacteria, UA FEW (A) NONE SEEN   Urine-Other MUCOUS PRESENT   Lipase, blood     Status: Abnormal   Collection Time: 09/28/16  1:23 PM  Result Value Ref Range   Lipase 10 (L) 11 - 51 U/L  Comprehensive metabolic panel     Status: Abnormal   Collection Time: 09/28/16  1:23 PM  Result Value Ref Range   Sodium 135 135 - 145 mmol/L   Potassium 4.0 3.5 - 5.1 mmol/L   Chloride 90 (L) 101 - 111 mmol/L   CO2 36 (H) 22 - 32 mmol/L   Glucose, Bld 200 (H) 65 - 99 mg/dL   BUN 9 6 - 20 mg/dL   Creatinine, Ser 0.38 (L) 0.44 - 1.00 mg/dL   Calcium 9.2 8.9 - 10.3 mg/dL   Total Protein 7.5 6.5 - 8.1 g/dL   Albumin 3.7 3.5 - 5.0 g/dL   AST 28 15 - 41 U/L   ALT 15 14 - 54 U/L   Alkaline Phosphatase 88 38 - 126 U/L   Total Bilirubin 0.2 (L) 0.3 - 1.2 mg/dL   GFR calc non Af Amer >60 >60 mL/min   GFR calc Af Amer >60 >60 mL/min    Comment: (NOTE) The eGFR has been calculated using the CKD EPI equation.  This calculation has not been validated in all clinical situations. eGFR's persistently <60 mL/min signify possible Chronic Kidney Disease.    Anion gap 9 5 - 15  CBC     Status: Abnormal   Collection Time: 09/28/16  1:23 PM  Result Value Ref Range   WBC 20.1 (H) 4.0 - 10.5 K/uL   RBC 4.47 3.87 - 5.11 MIL/uL   Hemoglobin 12.8 12.0 - 15.0 g/dL   HCT 39.2 36.0 - 46.0 %   MCV 87.7 78.0 - 100.0 fL   MCH 28.6 26.0 - 34.0 pg   MCHC 32.7 30.0 - 36.0 g/dL   RDW 12.0 11.5 - 15.5 %   Platelets 396 150 - 400 K/uL  HIV antibody     Status: None   Collection Time: 09/28/16  1:24 PM  Result Value Ref Range   HIV Screen 4th Generation wRfx Non Reactive Non Reactive    Comment: (NOTE) Performed At: [facility] [address] [place], [place] [id number] Lindon Romp MD [id number]   Hemoglobin A1c     Status: Abnormal   Collection Time: 09/28/16  1:24  PM  Result Value Ref Range   Hgb A1c MFr Bld 7.1 (H) 4.8 - 5.6 %    Comment: (NOTE)         Pre-diabetes: 5.7 - 6.4         Diabetes: >6.4         Glycemic control for adults with diabetes: <7.0    Mean Plasma Glucose 157 mg/dL    Comment: (NOTE) Performed At: [facility] [place], [place] [id number] Lindon Romp MD [id number]   Strep pneumoniae urinary antigen     Status: None   Collection Time: 09/28/16  6:00 PM  Result Value Ref Range   Strep Pneumo Urinary Antigen NEGATIVE NEGATIVE    Comment:        Infection due to S. pneumoniae cannot be absolutely ruled out since the antigen present may be below the detection limit of the test. Performed at [facility]   Glucose, capillary     Status: Abnormal   Collection Time: 09/29/16 12:22 AM  Result Value Ref Range   Glucose-Capillary 261 (H) 65 - 99 mg/dL   Comment 1 Notify RN    Comment 2 Document in Chart   CBC WITH DIFFERENTIAL     Status: Abnormal   Collection Time: 09/29/16  7:56 AM  Result Value Ref Range   WBC 10.7 (H) 4.0 - 10.5 K/uL   RBC 4.08 3.87 - 5.11 MIL/uL   Hemoglobin 11.5 (L) 12.0 - 15.0 g/dL   HCT 36.5 36.0 - 46.0 %   MCV 89.5 78.0 - 100.0 fL   MCH 28.2 26.0 - 34.0 pg   MCHC 31.5 30.0 - 36.0 g/dL   RDW 12.2 11.5 - 15.5 %   Platelets 363 150 - 400 K/uL   Neutrophils Relative % 93 %   Neutro Abs 10.0 (H) 1.7 - 7.7 K/uL   Lymphocytes Relative 6 %   Lymphs Abs 0.6 (L) 0.7 - 4.0 K/uL   Monocytes Relative 1 %   Monocytes Absolute 0.2 0.1 - 1.0 K/uL   Eosinophils Relative 0 %   Eosinophils Absolute 0.0 0.0 - 0.7 K/uL   Basophils Relative 0 %   Basophils Absolute 0.0 0.0 - 0.1 K/uL  Basic metabolic panel     Status: Abnormal   Collection Time: 09/29/16  7:56 AM  Result Value Ref Range  Sodium 138 135 - 145 mmol/L   Potassium 3.9 3.5 - 5.1 mmol/L   Chloride 97 (L) 101 - 111 mmol/L   CO2 37 (H) 22 - 32 mmol/L   Glucose, Bld 292 (H) 65 - 99 mg/dL   BUN 8 6 - 20  mg/dL   Creatinine, Ser 0.33 (L) 0.44 - 1.00 mg/dL   Calcium 8.5 (L) 8.9 - 10.3 mg/dL   GFR calc non Af Amer >60 >60 mL/min   GFR calc Af Amer >60 >60 mL/min    Comment: (NOTE) The eGFR has been calculated using the CKD EPI equation. This calculation has not been validated in all clinical situations. eGFR's persistently <60 mL/min signify possible Chronic Kidney Disease.    Anion gap 4 (L) 5 - 15  Glucose, capillary     Status: Abnormal   Collection Time: 09/29/16  8:00 AM  Result Value Ref Range   Glucose-Capillary 302 (H) 65 - 99 mg/dL   Comment 1 Notify RN    Comment 2 Document in Chart   Glucose, capillary     Status: Abnormal   Collection Time: 09/29/16 12:12 PM  Result Value Ref Range   Glucose-Capillary 256 (H) 65 - 99 mg/dL   Comment 1 Notify RN    Comment 2 Document in Chart   Glucose, capillary     Status: Abnormal   Collection Time: 09/29/16  4:48 PM  Result Value Ref Range   Glucose-Capillary 285 (H) 65 - 99 mg/dL   Comment 1 Notify RN    Comment 2 Document in Chart   Glucose, capillary     Status: Abnormal   Collection Time: 09/29/16  9:29 PM  Result Value Ref Range   Glucose-Capillary 269 (H) 65 - 99 mg/dL   Comment 1 Notify RN    Comment 2 Document in Chart   CBC with Differential/Platelet     Status: Abnormal (Preliminary result)   Collection Time: 09/30/16  6:47 AM  Result Value Ref Range   WBC 18.0 (H) 4.0 - 10.5 K/uL   RBC 3.89 3.87 - 5.11 MIL/uL   Hemoglobin 11.0 (L) 12.0 - 15.0 g/dL   HCT 35.1 (L) 36.0 - 46.0 %   MCV 90.2 78.0 - 100.0 fL   MCH 28.3 26.0 - 34.0 pg   MCHC 31.3 30.0 - 36.0 g/dL   RDW 12.2 11.5 - 15.5 %   Platelets 384 150 - 400 K/uL   Neutrophils Relative % PENDING %   Neutro Abs PENDING 1.7 - 7.7 K/uL   Band Neutrophils PENDING %   Lymphocytes Relative PENDING %   Lymphs Abs PENDING 0.7 - 4.0 K/uL   Monocytes Relative PENDING %   Monocytes Absolute PENDING 0.1 - 1.0 K/uL   Eosinophils Relative PENDING %   Eosinophils Absolute  PENDING 0.0 - 0.7 K/uL   Basophils Relative PENDING %   Basophils Absolute PENDING 0.0 - 0.1 K/uL   WBC Morphology PENDING    RBC Morphology PENDING    Smear Review PENDING    nRBC PENDING 0 /100 WBC   Metamyelocytes Relative PENDING %   Myelocytes PENDING %   Promyelocytes Absolute PENDING %   Blasts PENDING %  Glucose, capillary     Status: Abnormal   Collection Time: 09/30/16  8:02 AM  Result Value Ref Range   Glucose-Capillary 301 (H) 65 - 99 mg/dL   Comment 1 Notify RN     ABGS No results for input(s): PHART, PO2ART, TCO2, HCO3 in the last 72  hours.  Invalid input(s): PCO2 CULTURES No results found for this or any previous visit (from the past 240 hour(s)). Studies/Results: Dg Chest 2 View  Result Date: 09/28/2016 CLINICAL DATA:  Worsening productive cough with shortness of breath. EXAM: CHEST  2 VIEW COMPARISON:  05/30/2016 and 10/15/2015 FINDINGS: Again noted are prominent lung markings bilaterally, particularly at the lung bases. Similar findings on prior examinations and raises concern for chronic changes. Heart and mediastinum are within normal limits. There is no focal airspace disease or consolidation. No large pleural effusions. No acute bone abnormality. IMPRESSION: Prominent lung markings, particularly in the lower lungs. Findings are similar to prior examination. Findings raise concern for chronic changes versus an atypical infectious process. Consider further characterization with chest CT. Electronically Signed   By: Markus Daft M.D.   On: 09/28/2016 18:37   Ct Chest W Contrast  Result Date: 09/28/2016 CLINICAL DATA:  Shortness of breath and productive cough. Abnormal chest x-ray today. History of COPD and community acquired pneumonia. The patient was discharged from the hospital 06/01/2016 with a diagnosis of pneumonia. EXAM: CT CHEST WITH CONTRAST TECHNIQUE: Multidetector CT imaging of the chest was performed during intravenous contrast administration. CONTRAST:  75  ml ISOVUE-300 IOPAMIDOL (ISOVUE-300) INJECTION 61% COMPARISON:  PA and lateral chest earlier today and 01/29/2015. FINDINGS: Cardiovascular: Heart size is normal.  No pericardial effusion. Mediastinum/Nodes: There is mediastinal lymphadenopathy. Subcarinal node on image 70 measures 1.4 cm. Right precarinal node on image 56 measures 1.5 cm short axis dimension. AP window node on image 58 measures 1.3 cm short axis dimension. Left hilar node on image 80 measures 1.4 cm. Otherwise unremarkable. Lungs/Pleura: No pleural effusion. Lungs demonstrate some emphysematous disease. Extensive tree-in-bud opacities are present bilaterally and worst in the right lower lobe. No dominant nodule or mass is identified. No consolidative process. Mild peribronchial thickening is most conspicuous in the right middle lobe, lingula and lower lobes bilaterally. Upper Abdomen: Unremarkable. Musculoskeletal: No focal abnormality. IMPRESSION: Diffuse tree-in-bud pattern throughout the lungs is nonspecific but usually associated with infectious or inflammatory processes such as panbronchiolitis or atypical infection. Additional causes include connective tissue disorders and neoplastic processes such as primary pulmonary lymphoma. Mediastinal and hilar lymphadenopathy is presumably reactive but cannot be definitively characterized. Electronically Signed   By: Inge Rise M.D.   On: 09/28/2016 21:39    Medications:  Prior to Admission:  Prescriptions Prior to Admission  Medication Sig Dispense Refill Last Dose  . acetaminophen (TYLENOL) 500 MG tablet Take 500 mg by mouth every 6 (six) hours as needed for fever.    09/28/2016 at Unknown time  . albuterol (PROAIR HFA) 108 (90 BASE) MCG/ACT inhaler Inhale 2 puffs into the lungs every 6 (six) hours as needed. For shortness of breath   09/28/2016 at Unknown time  . albuterol (PROVENTIL) (2.5 MG/3ML) 0.083% nebulizer solution Take 2.5 mg by nebulization every 4 (four) hours as needed. For  shortness of breath    Past Week at Unknown time  . budesonide-formoterol (SYMBICORT) 160-4.5 MCG/ACT inhaler Inhale 2 puffs into the lungs 2 (two) times daily.   09/28/2016 at Unknown time  . clonazePAM (KLONOPIN) 0.5 MG tablet Take 0.5 mg by mouth 3 (three) times daily.    09/27/2016 at Unknown time  . fluticasone (FLONASE) 50 MCG/ACT nasal spray Place 2 sprays into both nostrils daily.  9 Past Week at Unknown time  . ibuprofen (ADVIL,MOTRIN) 200 MG tablet Take 400 mg by mouth every 6 (six) hours as needed for fever or  moderate pain.   09/27/2016 at Unknown time  . lisinopril (PRINIVIL,ZESTRIL) 20 MG tablet Take 20 mg by mouth daily.   09/27/2016 at Unknown time  . OxyCODONE (OXYCONTIN) 10 mg T12A Take 1 tablet (10 mg total) by mouth every 12 (twelve) hours. 60 tablet 0 09/28/2016 at Unknown time  . oxyCODONE (ROXICODONE) 15 MG immediate release tablet Take 15 mg by mouth every 4 (four) hours.   09/28/2016 at Unknown time  . pantoprazole (PROTONIX) 40 MG tablet Take 40 mg by mouth daily.   09/27/2016 at Unknown time  . pregabalin (LYRICA) 50 MG capsule Take 50-100 mg by mouth every 8 (eight) hours. 1 twice daily and two in the evening   09/27/2016 at Unknown time  . promethazine (PHENERGAN) 25 MG tablet Take 25 mg by mouth every 6 (six) hours as needed for nausea.    09/28/2016 at Unknown time  . RESTASIS 0.05 % ophthalmic emulsion Place 1 drop into both eyes 2 (two) times daily.  3 09/28/2016 at Unknown time  . SUMAtriptan (IMITREX) 50 MG tablet Take 50 mg by mouth daily as needed for migraine or headache.   12 unknown  . tiotropium (SPIRIVA) 18 MCG inhalation capsule Place 18 mcg into inhaler and inhale daily.   09/27/2016 at Unknown time  . sulfamethoxazole-trimethoprim (BACTRIM) 400-80 MG tablet Take 1 tablet by mouth 2 (two) times daily. (Patient not taking: Reported on 09/28/2016) 20 tablet 0 Not Taking at Unknown time   Scheduled: . azithromycin  500 mg Oral Q24H  . cefTRIAXone (ROCEPHIN)  IV  1 g  Intravenous Q24H  . clonazePAM  0.5 mg Oral TID  . cycloSPORINE  1 drop Both Eyes BID  . enoxaparin (LOVENOX) injection  40 mg Subcutaneous Q24H  . feeding supplement (GLUCERNA SHAKE)  237 mL Oral BID BM  . fluticasone  2 spray Each Nare Daily  . Influenza vac split quadrivalent PF  0.5 mL Intramuscular Tomorrow-1000  . insulin aspart  0-5 Units Subcutaneous QHS  . insulin aspart  0-9 Units Subcutaneous TID WC  . lisinopril  20 mg Oral Daily  . methylPREDNISolone (SOLU-MEDROL) injection  60 mg Intravenous Q6H  . mometasone-formoterol  2 puff Inhalation BID  . oxyCODONE  10 mg Oral Q12H  . pantoprazole  40 mg Oral Daily  . pregabalin  100 mg Oral QHS  . pregabalin  50 mg Oral BID WC   Continuous:  MSX:[id number], guaiFENesin-dextromethorphan, ibuprofen, ipratropium-albuterol, oxyCODONE, promethazine, SUMAtriptan  Assesment: She is admitted with pneumonia and COPD exacerbation. She is better but not ready for discharge. There is some possibility that she has Mycobacterium avium disease and I will obtain a sputum for that but not plan on treating her until we are certain that this is actually causing clinical illness. Principal Problem:   Atypical pneumonia Active Problems:   DM (diabetes mellitus) (HCC)   GERD (gastroesophageal reflux disease)   CAP (community acquired pneumonia)   COPD with acute exacerbation ([place])   Anxiety state   HTN (hypertension)    Plan: As above continue treatments. Increase insulin.    LOS: 2 days   Ralphael Southgate L 09/30/2016, 8:40 AM

## 2016-10-01 LAB — ACID FAST SMEAR (AFB): ACID FAST SMEAR - AFSCU2: NEGATIVE

## 2016-10-01 LAB — LEGIONELLA PNEUMOPHILA SEROGP 1 UR AG: L. PNEUMOPHILA SEROGP 1 UR AG: NEGATIVE

## 2016-10-01 LAB — GLUCOSE, CAPILLARY: Glucose-Capillary: 274 mg/dL — ABNORMAL HIGH (ref 65–99)

## 2016-10-01 MED ORDER — AZITHROMYCIN 250 MG PO TABS: ORAL_TABLET | ORAL | 0 refills | Status: DC

## 2016-10-01 MED ORDER — PREDNISONE 10 MG (21) PO TBPK: ORAL_TABLET | ORAL | 0 refills | Status: DC

## 2016-10-01 MED ORDER — METFORMIN HCL 500 MG PO TABS: 500.0000 mg | ORAL_TABLET | Freq: Two times a day (BID) | ORAL | 5 refills | Status: DC

## 2016-10-01 MED ORDER — CEPHALEXIN 500 MG PO CAPS: 500.0000 mg | ORAL_CAPSULE | Freq: Three times a day (TID) | ORAL | 0 refills | Status: DC

## 2016-10-01 NOTE — Progress Notes (Signed)
Pt discharged home today per Dr. Hawkins. Pt's IV site D/C'd and WDL. Pt's VSS. Pt provided with home medication list, discharge instructions and prescriptions. Verbalized understanding. Pt left floor via WC in stable condition accompanied by NT. 

## 2016-10-01 NOTE — Discharge Summary (Signed)
Physician Discharge Summary  Patient ID: [name] MRN: [id number] DOB/AGE: 48/06/1966 48 y.o. Primary Care Physician:Blake Vetrano L, MD Admit date: 09/28/2016 Discharge date: 10/01/2016    Discharge Diagnoses:   Principal Problem:   Atypical pneumonia Active Problems:   DM (diabetes mellitus) (HCC)   GERD (gastroesophageal reflux disease)   CAP (community acquired pneumonia)   COPD with acute exacerbation (HCC)   Anxiety state   HTN (hypertension)     Medication List    STOP taking these medications   sulfamethoxazole-trimethoprim 400-80 MG tablet Commonly known as:  BACTRIM     TAKE these medications   acetaminophen 500 MG tablet Commonly known as:  TYLENOL Take 500 mg by mouth every 6 (six) hours as needed for fever.   albuterol (2.5 MG/3ML) 0.083% nebulizer solution Commonly known as:  PROVENTIL Take 2.5 mg by nebulization every 4 (four) hours as needed. For shortness of breath   PROAIR HFA 108 (90 Base) MCG/ACT inhaler Generic drug:  albuterol Inhale 2 puffs into the lungs every 6 (six) hours as needed. For shortness of breath   azithromycin 250 MG tablet Commonly known as:  ZITHROMAX Z-PAK Take by package instructions   budesonide-formoterol 160-4.5 MCG/ACT inhaler Commonly known as:  SYMBICORT Inhale 2 puffs into the lungs 2 (two) times daily.   cephALEXin 500 MG capsule Commonly known as:  KEFLEX Take 1 capsule (500 mg total) by mouth 3 (three) times daily.   clonazePAM 0.5 MG tablet Commonly known as:  KLONOPIN Take 0.5 mg by mouth 3 (three) times daily.   fluticasone 50 MCG/ACT nasal spray Commonly known as:  FLONASE Place 2 sprays into both nostrils daily.   ibuprofen 200 MG tablet Commonly known as:  ADVIL,MOTRIN Take 400 mg by mouth every 6 (six) hours as needed for fever or moderate pain.   lisinopril 20 MG tablet Commonly known as:  PRINIVIL,ZESTRIL Take 20 mg by mouth daily.   metFORMIN 500 MG tablet Commonly known as:   GLUCOPHAGE Take 1 tablet (500 mg total) by mouth 2 (two) times daily with a meal.   oxyCODONE 15 MG immediate release tablet Commonly known as:  ROXICODONE Take 15 mg by mouth every 4 (four) hours.   oxyCODONE 10 mg 12 hr tablet Commonly known as:  OXYCONTIN Take 1 tablet (10 mg total) by mouth every 12 (twelve) hours.   pantoprazole 40 MG tablet Commonly known as:  PROTONIX Take 40 mg by mouth daily.   predniSONE 10 MG (21) Tbpk tablet Commonly known as:  STERAPRED UNI-PAK 21 TAB Take by package instructions   pregabalin 50 MG capsule Commonly known as:  LYRICA Take 50-100 mg by mouth every 8 (eight) hours. 1 twice daily and two in the evening   promethazine 25 MG tablet Commonly known as:  PHENERGAN Take 25 mg by mouth every 6 (six) hours as needed for nausea.   RESTASIS 0.05 % ophthalmic emulsion Generic drug:  cycloSPORINE Place 1 drop into both eyes 2 (two) times daily.   SUMAtriptan 50 MG tablet Commonly known as:  IMITREX Take 50 mg by mouth daily as needed for migraine or headache.   tiotropium 18 MCG inhalation capsule Commonly known as:  SPIRIVA Place 18 mcg into inhaler and inhale daily.       Discharged Condition:Improved    Consults: None  Significant Diagnostic Studies: Dg Chest 2 View  Result Date: 09/28/2016 CLINICAL DATA:  Worsening productive cough with shortness of breath. EXAM: CHEST  2 VIEW COMPARISON:  05/30/2016 and  10/15/2015 FINDINGS: Again noted are prominent lung markings bilaterally, particularly at the lung bases. Similar findings on prior examinations and raises concern for chronic changes. Heart and mediastinum are within normal limits. There is no focal airspace disease or consolidation. No large pleural effusions. No acute bone abnormality. IMPRESSION: Prominent lung markings, particularly in the lower lungs. Findings are similar to prior examination. Findings raise concern for chronic changes versus an atypical infectious process.  Consider further characterization with chest CT. Electronically Signed   By: Richarda Overlie M.D.   On: 09/28/2016 18:37   Ct Chest W Contrast  Result Date: 09/28/2016 CLINICAL DATA:  Shortness of breath and productive cough. Abnormal chest x-ray today. History of COPD and community acquired pneumonia. The patient was discharged from the hospital 06/01/2016 with a diagnosis of pneumonia. EXAM: CT CHEST WITH CONTRAST TECHNIQUE: Multidetector CT imaging of the chest was performed during intravenous contrast administration. CONTRAST:  75 ml ISOVUE-300 IOPAMIDOL (ISOVUE-300) INJECTION 61% COMPARISON:  PA and lateral chest earlier today and 01/29/2015. FINDINGS: Cardiovascular: Heart size is normal.  No pericardial effusion. Mediastinum/Nodes: There is mediastinal lymphadenopathy. Subcarinal node on image 70 measures 1.4 cm. Right precarinal node on image 56 measures 1.5 cm short axis dimension. AP window node on image 58 measures 1.3 cm short axis dimension. Left hilar node on image 80 measures 1.4 cm. Otherwise unremarkable. Lungs/Pleura: No pleural effusion. Lungs demonstrate some emphysematous disease. Extensive tree-in-bud opacities are present bilaterally and worst in the right lower lobe. No dominant nodule or mass is identified. No consolidative process. Mild peribronchial thickening is most conspicuous in the right middle lobe, lingula and lower lobes bilaterally. Upper Abdomen: Unremarkable. Musculoskeletal: No focal abnormality. IMPRESSION: Diffuse tree-in-bud pattern throughout the lungs is nonspecific but usually associated with infectious or inflammatory processes such as panbronchiolitis or atypical infection. Additional causes include connective tissue disorders and neoplastic processes such as primary pulmonary lymphoma. Mediastinal and hilar lymphadenopathy is presumably reactive but cannot be definitively characterized. Electronically Signed   By: Drusilla Kanner M.D.   On: 09/28/2016 21:39    Lab  Results: Basic Metabolic Panel:  Recent Labs  16/10/96 1323 09/29/16 0756  NA 135 138  K 4.0 3.9  CL 90* 97*  CO2 36* 37*  GLUCOSE 200* 292*  BUN 9 8  CREATININE 0.38* 0.33*  CALCIUM 9.2 8.5*   Liver Function Tests:  Recent Labs  09/28/16 1323  AST 28  ALT 15  ALKPHOS 88  BILITOT 0.2*  PROT 7.5  ALBUMIN 3.7     CBC:  Recent Labs  09/29/16 0756 09/30/16 0647  WBC 10.7* 18.0*  NEUTROABS 10.0* 16.4*  HGB 11.5* 11.0*  HCT 36.5 35.1*  MCV 89.5 90.2  PLT 363 384    Recent Results (from the past 240 hour(s))  Culture, sputum-assessment     Status: None   Collection Time: 09/30/16  9:23 AM  Result Value Ref Range Status   Specimen Description SPUTUM EXPECTORATED SPUTUM  Final   Special Requests NONE  Final   Sputum evaluation   Final    THIS SPECIMEN IS ACCEPTABLE. RESPIRATORY CULTURE REPORT TO FOLLOW. Performed at [facility]    Report Status 09/30/2016 FINAL  Final  Culture, respiratory (NON-Expectorated)     Status: None (Preliminary result)   Collection Time: 09/30/16  9:45 AM  Result Value Ref Range Status   Specimen Description EXPECTORATED SPUTUM  Final   Special Requests NONE  Final   Gram Stain   Final    ABUNDANT  WBC PRESENT, PREDOMINANTLY PMN FEW GRAM POSITIVE RODS FEW GRAM POSITIVE COCCI IN PAIRS FEW GRAM NEGATIVE COCCI RARE GRAM VARIABLE ROD RARE GRAM NEGATIVE RODS FEW YEAST FEW SQUAMOUS EPITHELIAL CELLS PRESENT Performed [facility]    Culture PENDING  Incomplete   Report Status PENDING  Incomplete     Hospital Course: She came to the hospital with increasing shortness of breath. She was coughing and congested. CT chest suggested atypical pneumonia. She has severe COPD at baseline. She was treated with IV steroids IV antibiotics and improved. At the time of discharge she was back at baseline. She did have elevation of her blood sugar which is a frequent problem when she has COPD exacerbation but it generally improves  to not needing any treatment at home.  Discharge Exam: Blood pressure (!) 122/52, pulse 91, temperature 98.2 F (36.8 C), temperature source Oral, resp. rate 20, height 5\' 1"  (1.549 m), weight 59.6 kg (131 lb 6.4 oz), SpO2 100 %. She is awake and alert. Chest much clearer. Heart is regular. She looks comfortable  Disposition: Home she does not want home health services. I gave her a prescription for metformin to use as needed until her blood sugars come under control when she gets off the steroids. She will follow in my office.      Signed: Abdulahi Schor L   10/01/2016, 8:50 AM

## 2016-10-01 NOTE — Progress Notes (Signed)
Subjective: She says she feels much better and wants to go home. She's not coughing. She's not as short of breath. She's not coughing up any sputum. No nausea vomiting or chest pain  Objective: Vital signs in last 24 hours: Temp:  [97.9 F (36.6 C)-98.6 F (37 C)] 98.2 F (36.8 C) (11/09 0620) Pulse Rate:  [88-91] 91 (11/09 0620) Resp:  [20] 20 (11/09 0620) BP: (118-123)/(48-52) 122/52 (11/09 0620) SpO2:  [97 %-100 %] 100 % (11/09 0806) Weight change:  Last BM Date: 09/29/16  Intake/Output from previous day: 11/08 0701 - 11/09 0700 In: 240 [P.O.:240] Out: -   PHYSICAL EXAM General appearance: alert, cooperative and no distress Resp: Still some rhonchi but much clearer than yesterday Cardio: regular rate and rhythm, S1, S2 normal, no murmur, click, rub or gallop GI: soft, non-tender; bowel sounds normal; no masses,  no organomegaly Extremities: extremities normal, atraumatic, no cyanosis or edema skin warm and dry, mucus membranes are moist  Lab Results:  Results for orders placed or performed during the hospital encounter of 09/28/16 (from the past 48 hour(s))  Glucose, capillary     Status: Abnormal   Collection Time: 09/29/16 12:12 PM  Result Value Ref Range   Glucose-Capillary 256 (H) 65 - 99 mg/dL   Comment 1 Notify RN    Comment 2 Document in Chart   Glucose, capillary     Status: Abnormal   Collection Time: 09/29/16  4:48 PM  Result Value Ref Range   Glucose-Capillary 285 (H) 65 - 99 mg/dL   Comment 1 Notify RN    Comment 2 Document in Chart   Glucose, capillary     Status: Abnormal   Collection Time: 09/29/16  9:29 PM  Result Value Ref Range   Glucose-Capillary 269 (H) 65 - 99 mg/dL   Comment 1 Notify RN    Comment 2 Document in Chart   CBC with Differential/Platelet     Status: Abnormal   Collection Time: 09/30/16  6:47 AM  Result Value Ref Range   WBC 18.0 (H) 4.0 - 10.5 K/uL   RBC 3.89 3.87 - 5.11 MIL/uL   Hemoglobin 11.0 ([name]) 12.0 - 15.0 g/dL   HCT  82.935.1 ([name]) 56.236.0 - 46.0 %   MCV 90.2 78.0 - 100.0 fL   MCH 28.3 26.0 - 34.0 pg   MCHC 31.3 30.0 - 36.0 g/dL   RDW 13.012.2 86.511.5 - 78.415.5 %   Platelets 384 150 - 400 K/uL   Neutrophils Relative % 91 %   Lymphocytes Relative 5 %   Monocytes Relative 4 %   Eosinophils Relative 0 %   Basophils Relative 0 %   Neutro Abs 16.4 (H) 1.7 - 7.7 K/uL   Lymphs Abs 0.9 0.7 - 4.0 K/uL   Monocytes Absolute 0.7 0.1 - 1.0 K/uL   Eosinophils Absolute 0.0 0.0 - 0.7 K/uL   Basophils Absolute 0.0 0.0 - 0.1 K/uL   WBC Morphology ATYPICAL LYMPHOCYTES   Glucose, capillary     Status: Abnormal   Collection Time: 09/30/16  8:02 AM  Result Value Ref Range   Glucose-Capillary 301 (H) 65 - 99 mg/dL   Comment 1 Notify RN   Culture, sputum-assessment     Status: None   Collection Time: 09/30/16  9:23 AM  Result Value Ref Range   Specimen Description SPUTUM EXPECTORATED SPUTUM    Special Requests NONE    Sputum evaluation      THIS SPECIMEN IS ACCEPTABLE. RESPIRATORY CULTURE REPORT TO FOLLOW. Performed  at [facility]    Report Status 09/30/2016 FINAL   Culture, respiratory (NON-Expectorated)     Status: None (Preliminary result)   Collection Time: 09/30/16  9:45 AM  Result Value Ref Range   Specimen Description EXPECTORATED SPUTUM    Special Requests NONE    Gram Stain      ABUNDANT WBC PRESENT, PREDOMINANTLY PMN FEW GRAM POSITIVE RODS FEW GRAM POSITIVE COCCI IN PAIRS FEW GRAM NEGATIVE COCCI RARE GRAM VARIABLE ROD RARE GRAM NEGATIVE RODS FEW YEAST FEW SQUAMOUS EPITHELIAL CELLS PRESENT Performed at [facility]    Culture PENDING    Report Status PENDING   Glucose, capillary     Status: Abnormal   Collection Time: 09/30/16 11:31 AM  Result Value Ref Range   Glucose-Capillary 315 (H) 65 - 99 mg/dL   Comment 1 Notify RN   Glucose, capillary     Status: Abnormal   Collection Time: 09/30/16  4:19 PM  Result Value Ref Range   Glucose-Capillary 191 (H) 65 - 99 mg/dL  Glucose, capillary      Status: Abnormal   Collection Time: 09/30/16  9:02 PM  Result Value Ref Range   Glucose-Capillary 282 (H) 65 - 99 mg/dL   Comment 1 Notify RN    Comment 2 Document in Chart   Glucose, capillary     Status: Abnormal   Collection Time: 10/01/16  7:30 AM  Result Value Ref Range   Glucose-Capillary 274 (H) 65 - 99 mg/dL   Comment 1 Notify RN    Comment 2 Document in Chart     ABGS No results for input(s): PHART, PO2ART, TCO2, HCO3 in the last 72 hours.  Invalid input(s): PCO2 CULTURES Recent Results (from the past 240 hour(s))  Culture, sputum-assessment     Status: None   Collection Time: 09/30/16  9:23 AM  Result Value Ref Range Status   Specimen Description SPUTUM EXPECTORATED SPUTUM  Final   Special Requests NONE  Final   Sputum evaluation   Final    THIS SPECIMEN IS ACCEPTABLE. RESPIRATORY CULTURE REPORT TO FOLLOW. Performed at [facility]    Report Status 09/30/2016 FINAL  Final  Culture, respiratory (NON-Expectorated)     Status: None (Preliminary result)   Collection Time: 09/30/16  9:45 AM  Result Value Ref Range Status   Specimen Description EXPECTORATED SPUTUM  Final   Special Requests NONE  Final   Gram Stain   Final    ABUNDANT WBC PRESENT, PREDOMINANTLY PMN FEW GRAM POSITIVE RODS FEW GRAM POSITIVE COCCI IN PAIRS FEW GRAM NEGATIVE COCCI RARE GRAM VARIABLE ROD RARE GRAM NEGATIVE RODS FEW YEAST FEW SQUAMOUS EPITHELIAL CELLS PRESENT Performed at [facility]    Culture PENDING  Incomplete   Report Status PENDING  Incomplete   Studies/Results: No results found.  Medications:  Prior to Admission:  Prescriptions Prior to Admission  Medication Sig Dispense Refill Last Dose  . acetaminophen (TYLENOL) 500 MG tablet Take 500 mg by mouth every 6 (six) hours as needed for fever.    09/28/2016 at Unknown time  . albuterol (PROAIR HFA) 108 (90 BASE) MCG/ACT inhaler Inhale 2 puffs into the lungs every 6 (six) hours as needed. For shortness of breath    09/28/2016 at Unknown time  . albuterol (PROVENTIL) (2.5 MG/3ML) 0.083% nebulizer solution Take 2.5 mg by nebulization every 4 (four) hours as needed. For shortness of breath    Past Week at Unknown time  . budesonide-formoterol (SYMBICORT) 160-4.5 MCG/ACT inhaler Inhale  2 puffs into the lungs 2 (two) times daily.   09/28/2016 at Unknown time  . clonazePAM (KLONOPIN) 0.5 MG tablet Take 0.5 mg by mouth 3 (three) times daily.    09/27/2016 at Unknown time  . fluticasone (FLONASE) 50 MCG/ACT nasal spray Place 2 sprays into both nostrils daily.  9 Past Week at Unknown time  . ibuprofen (ADVIL,MOTRIN) 200 MG tablet Take 400 mg by mouth every 6 (six) hours as needed for fever or moderate pain.   09/27/2016 at Unknown time  . lisinopril (PRINIVIL,ZESTRIL) 20 MG tablet Take 20 mg by mouth daily.   09/27/2016 at Unknown time  . OxyCODONE (OXYCONTIN) 10 mg T12A Take 1 tablet (10 mg total) by mouth every 12 (twelve) hours. 60 tablet 0 09/28/2016 at Unknown time  . oxyCODONE (ROXICODONE) 15 MG immediate release tablet Take 15 mg by mouth every 4 (four) hours.   09/28/2016 at Unknown time  . pantoprazole (PROTONIX) 40 MG tablet Take 40 mg by mouth daily.   09/27/2016 at Unknown time  . pregabalin (LYRICA) 50 MG capsule Take 50-100 mg by mouth every 8 (eight) hours. 1 twice daily and two in the evening   09/27/2016 at Unknown time  . promethazine (PHENERGAN) 25 MG tablet Take 25 mg by mouth every 6 (six) hours as needed for nausea.    09/28/2016 at Unknown time  . RESTASIS 0.05 % ophthalmic emulsion Place 1 drop into both eyes 2 (two) times daily.  3 09/28/2016 at Unknown time  . SUMAtriptan (IMITREX) 50 MG tablet Take 50 mg by mouth daily as needed for migraine or headache.   12 unknown  . tiotropium (SPIRIVA) 18 MCG inhalation capsule Place 18 mcg into inhaler and inhale daily.   09/27/2016 at Unknown time  . sulfamethoxazole-trimethoprim (BACTRIM) 400-80 MG tablet Take 1 tablet by mouth 2 (two) times daily. (Patient not  taking: Reported on 09/28/2016) 20 tablet 0 Not Taking at Unknown time   Scheduled: . azithromycin  500 mg Oral Q24H  . cefTRIAXone (ROCEPHIN)  IV  1 g Intravenous Q24H  . clonazePAM  0.5 mg Oral TID  . cycloSPORINE  1 drop Both Eyes BID  . enoxaparin (LOVENOX) injection  40 mg Subcutaneous Q24H  . feeding supplement (GLUCERNA SHAKE)  237 mL Oral BID BM  . fluticasone  2 spray Each Nare Daily  . insulin aspart  0-5 Units Subcutaneous QHS  . insulin aspart  0-9 Units Subcutaneous TID WC  . insulin aspart  3 Units Subcutaneous TID WC  . insulin glargine  6 Units Subcutaneous Daily  . lisinopril  20 mg Oral Daily  . methylPREDNISolone (SOLU-MEDROL) injection  60 mg Intravenous Q6H  . mometasone-formoterol  2 puff Inhalation BID  . oxyCODONE  10 mg Oral Q12H  . pantoprazole  40 mg Oral Daily  . pregabalin  100 mg Oral QHS  . pregabalin  50 mg Oral BID WC   Continuous:  ZOX:[id number], guaiFENesin-dextromethorphan, ibuprofen, ipratropium-albuterol, oxyCODONE, promethazine, SUMAtriptan  Assesment: She was admitted with atypical pneumonia which is community-acquired. She has pretty severe COPD at baseline. She may have some sort of atypical mycobacterial infection or colonization based on CT. She is markedly improved. Principal Problem:   Atypical pneumonia Active Problems:   DM (diabetes mellitus) (HCC)   GERD (gastroesophageal reflux disease)   CAP (community acquired pneumonia)   COPD with acute exacerbation (HCC)   Anxiety state   HTN (hypertension)    Plan:discharge home.    LOS: 3 days  [name] 10/01/2016, 8:26 AM

## 2016-10-03 LAB — CULTURE, RESPIRATORY W GRAM STAIN

## 2016-10-03 LAB — CULTURE, RESPIRATORY: CULTURE: NORMAL

## 2016-11-13 LAB — ACID FAST CULTURE WITH REFLEXED SENSITIVITIES (MYCOBACTERIA)

## 2016-11-13 LAB — ACID FAST CULTURE WITH REFLEXED SENSITIVITIES: ACID FAST CULTURE - AFSCU3: NEGATIVE

## 2016-11-19 IMAGING — CR DG CHEST 1V PORT
1 series · 1 of 1 positions shown · non-contrast
Comparison: 01/29/2015

CLINICAL DATA: Cough and congestion

EXAM:
PORTABLE CHEST - 1 VIEW

[ap portable]
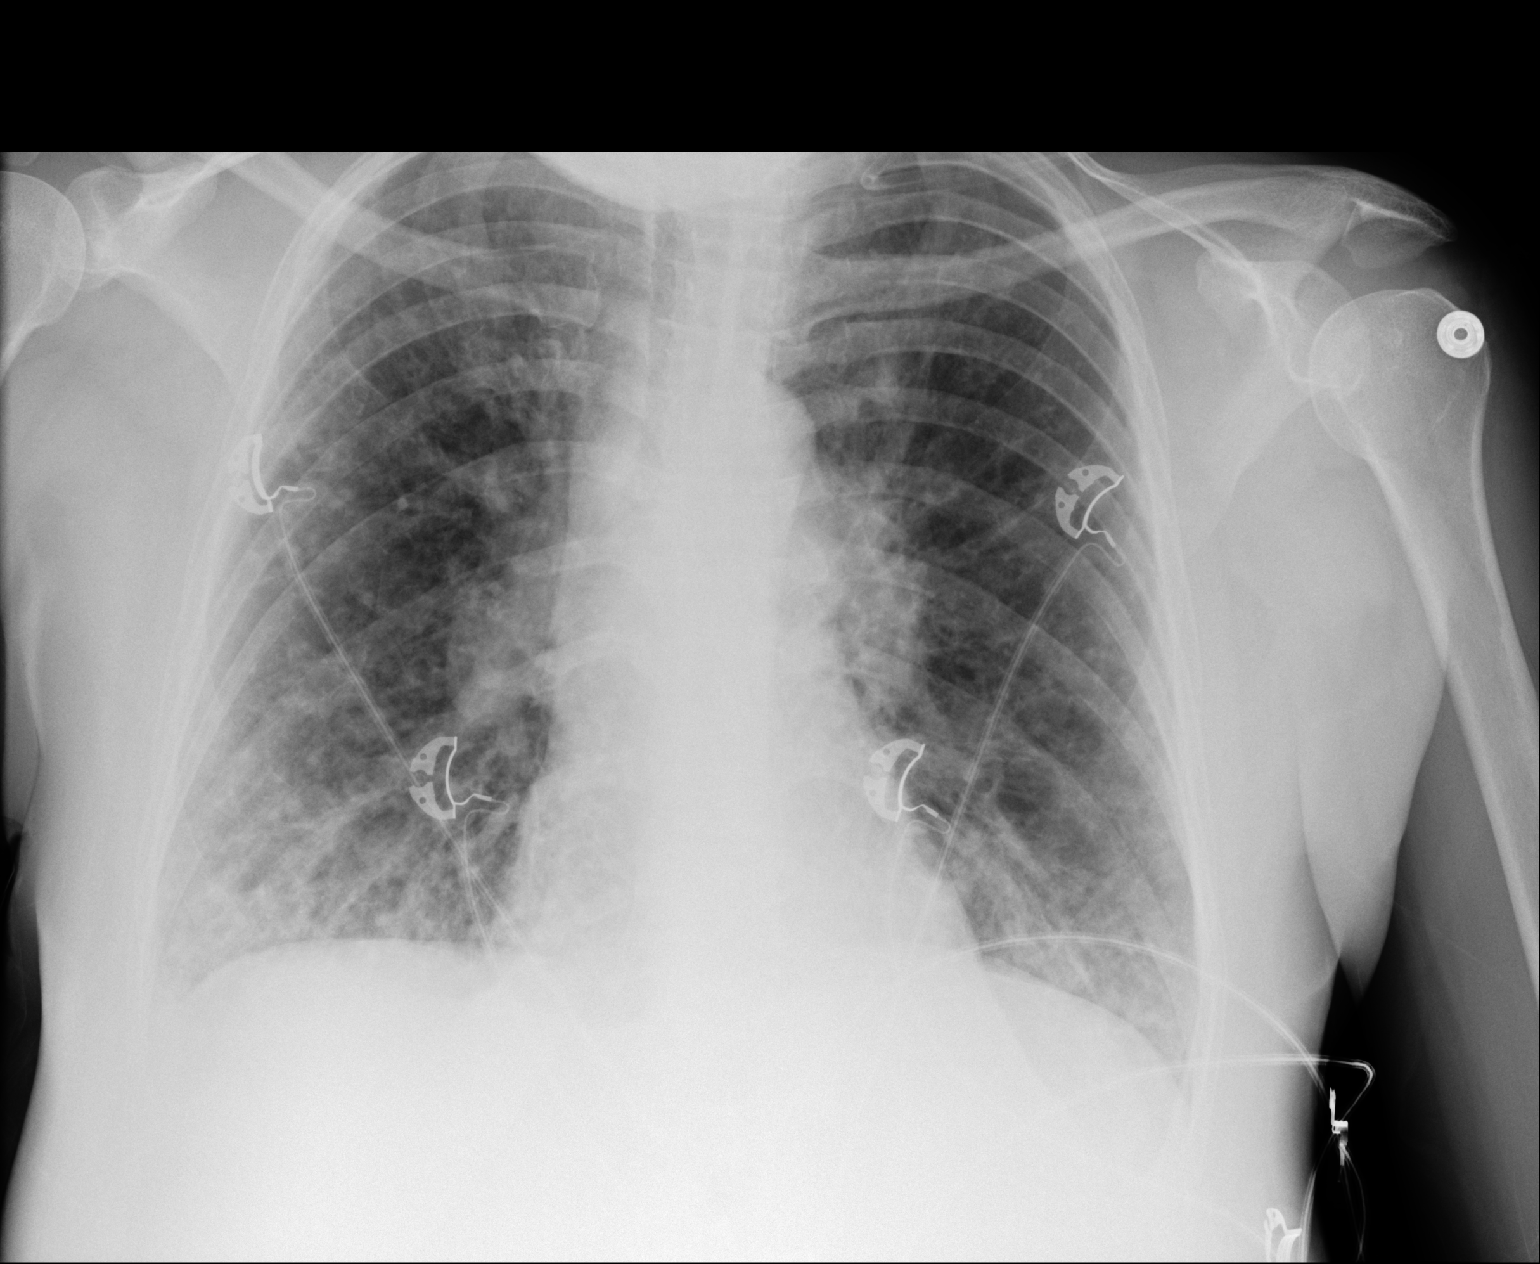

[1 of 1 positions shown; findings below may reference images not displayed]

FINDINGS: Cardiac shadow is within normal limits. The lungs are well aerated
bilaterally. Patchy infiltrative changes are noted in the bases
bilaterally as well as within the right upper lobe. No sizable
effusion is seen. No bony abnormality is noted.
IMPRESSION: Patchy infiltrates bilaterally.  Continued followup is recommended.

## 2017-02-20 ENCOUNTER — Inpatient Hospital Stay (HOSPITAL_COMMUNITY)
Admission: EM | Admit: 2017-02-20 | Discharge: 2017-02-23 | DRG: 190 | Disposition: A | Discharge status: Home / Self Care | Payer: Medicaid Other | Attending: Pulmonary Disease | Admitting: Pulmonary Disease

## 2017-02-20 ENCOUNTER — Encounter (HOSPITAL_COMMUNITY): Payer: Self-pay | Admitting: Cardiology

## 2017-02-20 ENCOUNTER — Emergency Department (HOSPITAL_COMMUNITY): Payer: Medicaid Other

## 2017-02-20 DIAGNOSIS — J449 Chronic obstructive pulmonary disease, unspecified: Secondary | ICD-10-CM | POA: Diagnosis present

## 2017-02-20 DIAGNOSIS — K219 Gastro-esophageal reflux disease without esophagitis: Secondary | ICD-10-CM | POA: Diagnosis present

## 2017-02-20 DIAGNOSIS — F17210 Nicotine dependence, cigarettes, uncomplicated: Secondary | ICD-10-CM | POA: Diagnosis present

## 2017-02-20 DIAGNOSIS — D72820 Lymphocytosis (symptomatic): Secondary | ICD-10-CM | POA: Diagnosis present

## 2017-02-20 DIAGNOSIS — Z833 Family history of diabetes mellitus: Secondary | ICD-10-CM

## 2017-02-20 DIAGNOSIS — I10 Essential (primary) hypertension: Secondary | ICD-10-CM | POA: Diagnosis present

## 2017-02-20 DIAGNOSIS — Z7951 Long term (current) use of inhaled steroids: Secondary | ICD-10-CM

## 2017-02-20 DIAGNOSIS — Z885 Allergy status to narcotic agent status: Secondary | ICD-10-CM

## 2017-02-20 DIAGNOSIS — J9620 Acute and chronic respiratory failure, unspecified whether with hypoxia or hypercapnia: Secondary | ICD-10-CM | POA: Diagnosis present

## 2017-02-20 DIAGNOSIS — Z7984 Long term (current) use of oral hypoglycemic drugs: Secondary | ICD-10-CM | POA: Diagnosis not present

## 2017-02-20 DIAGNOSIS — G8929 Other chronic pain: Secondary | ICD-10-CM | POA: Diagnosis present

## 2017-02-20 DIAGNOSIS — J9621 Acute and chronic respiratory failure with hypoxia: Secondary | ICD-10-CM | POA: Diagnosis present

## 2017-02-20 DIAGNOSIS — J441 Chronic obstructive pulmonary disease with (acute) exacerbation: Secondary | ICD-10-CM | POA: Diagnosis present

## 2017-02-20 DIAGNOSIS — E119 Type 2 diabetes mellitus without complications: Secondary | ICD-10-CM

## 2017-02-20 DIAGNOSIS — E871 Hypo-osmolality and hyponatremia: Secondary | ICD-10-CM | POA: Diagnosis present

## 2017-02-20 DIAGNOSIS — E873 Alkalosis: Secondary | ICD-10-CM | POA: Diagnosis present

## 2017-02-20 DIAGNOSIS — Z880 Allergy status to penicillin: Secondary | ICD-10-CM

## 2017-02-20 DIAGNOSIS — Z8249 Family history of ischemic heart disease and other diseases of the circulatory system: Secondary | ICD-10-CM | POA: Diagnosis not present

## 2017-02-20 DIAGNOSIS — M545 Low back pain, unspecified: Secondary | ICD-10-CM | POA: Diagnosis present

## 2017-02-20 DIAGNOSIS — R0902 Hypoxemia: Secondary | ICD-10-CM

## 2017-02-20 DIAGNOSIS — F419 Anxiety disorder, unspecified: Secondary | ICD-10-CM | POA: Diagnosis present

## 2017-02-20 DIAGNOSIS — F331 Major depressive disorder, recurrent, moderate: Secondary | ICD-10-CM | POA: Diagnosis present

## 2017-02-20 DIAGNOSIS — Z888 Allergy status to other drugs, medicaments and biological substances status: Secondary | ICD-10-CM | POA: Diagnosis not present

## 2017-02-20 DIAGNOSIS — Z9981 Dependence on supplemental oxygen: Secondary | ICD-10-CM

## 2017-02-20 DIAGNOSIS — Z79899 Other long term (current) drug therapy: Secondary | ICD-10-CM

## 2017-02-20 DIAGNOSIS — Z79891 Long term (current) use of opiate analgesic: Secondary | ICD-10-CM

## 2017-02-20 LAB — BASIC METABOLIC PANEL
ANION GAP: 11 (ref 5–15)
BUN: 10 mg/dL (ref 6–20)
CALCIUM: 9.2 mg/dL (ref 8.9–10.3)
CHLORIDE: 85 mmol/L — AB (ref 101–111)
CO2: 37 mmol/L — AB (ref 22–32)
CREATININE: 0.38 mg/dL — AB (ref 0.44–1.00)
GFR calc non Af Amer: >60 mL/min (ref >60)
GLUCOSE: 172 mg/dL — AB (ref 65–99)
Potassium: 3.6 mmol/L (ref 3.5–5.1)
Sodium: 133 mmol/L — ABNORMAL LOW (ref 135–145)

## 2017-02-20 LAB — CBC WITH DIFFERENTIAL/PLATELET
BASOS PCT: 0 %
Basophils Absolute: 0 10*3/uL (ref 0.0–0.1)
EOS ABS: 0 10*3/uL (ref 0.0–0.7)
EOS PCT: 0 %
HCT: 38.3 % (ref 36.0–46.0)
Hemoglobin: 12.5 g/dL (ref 12.0–15.0)
LYMPHS ABS: 1 10*3/uL (ref 0.7–4.0)
Lymphocytes Relative: 5 %
MCH: 28.1 pg (ref 26.0–34.0)
MCHC: 32.6 g/dL (ref 30.0–36.0)
MCV: 86.1 fL (ref 78.0–100.0)
MONOS PCT: 4 %
Monocytes Absolute: 0.8 10*3/uL (ref 0.1–1.0)
Neutro Abs: 17.7 10*3/uL — ABNORMAL HIGH (ref 1.7–7.7)
Neutrophils Relative %: 91 %
Platelets: 393 10*3/uL (ref 150–400)
RBC: 4.45 MIL/uL (ref 3.87–5.11)
RDW: 12 % (ref 11.5–15.5)
WBC: 19.6 10*3/uL — ABNORMAL HIGH (ref 4.0–10.5)

## 2017-02-20 LAB — GLUCOSE, CAPILLARY: Glucose-Capillary: 224 mg/dL — ABNORMAL HIGH (ref 65–99)

## 2017-02-20 MED ORDER — OXYCODONE HCL 5 MG PO TABS
15.0000 mg | ORAL_TABLET | Freq: Once | ORAL | Status: AC
  Administered 2017-02-20: 15 mg via ORAL
  Filled 2017-02-20: qty 3

## 2017-02-20 MED ORDER — ALBUTEROL (5 MG/ML) CONTINUOUS INHALATION SOLN
10.0000 mg/h | INHALATION_SOLUTION | Freq: Once | RESPIRATORY_TRACT | Status: AC
  Administered 2017-02-20: 10 mg/h via RESPIRATORY_TRACT
  Filled 2017-02-20: qty 20

## 2017-02-20 MED ORDER — OXYCODONE HCL 5 MG PO TABS
5.0000 mg | ORAL_TABLET | Freq: Four times a day (QID) | ORAL | Status: DC | PRN
  Administered 2017-02-21 (×2): 5 mg via ORAL
  Filled 2017-02-20 (×2): qty 1

## 2017-02-20 MED ORDER — METHYLPREDNISOLONE SODIUM SUCC 40 MG IJ SOLR
40.0000 mg | Freq: Four times a day (QID) | INTRAMUSCULAR | Status: DC
  Administered 2017-02-20 – 2017-02-23 (×10): 40 mg via INTRAVENOUS
  Filled 2017-02-20 (×10): qty 1

## 2017-02-20 MED ORDER — PANTOPRAZOLE SODIUM 40 MG PO TBEC
40.0000 mg | DELAYED_RELEASE_TABLET | Freq: Every day | ORAL | Status: DC
  Administered 2017-02-20 – 2017-02-23 (×4): 40 mg via ORAL
  Filled 2017-02-20 (×4): qty 1

## 2017-02-20 MED ORDER — ONDANSETRON HCL 4 MG/2ML IJ SOLN
4.0000 mg | Freq: Four times a day (QID) | INTRAMUSCULAR | Status: DC | PRN
  Administered 2017-02-21: 4 mg via INTRAVENOUS
  Filled 2017-02-20: qty 2

## 2017-02-20 MED ORDER — ALBUTEROL SULFATE (2.5 MG/3ML) 0.083% IN NEBU
2.5000 mg | INHALATION_SOLUTION | Freq: Four times a day (QID) | RESPIRATORY_TRACT | Status: DC
  Administered 2017-02-20 – 2017-02-22 (×7): 2.5 mg via RESPIRATORY_TRACT
  Filled 2017-02-20 (×6): qty 3

## 2017-02-20 MED ORDER — SODIUM CHLORIDE 0.9 % IV BOLUS (SEPSIS)
1000.0000 mL | Freq: Once | INTRAVENOUS | Status: AC
  Administered 2017-02-20: 1000 mL via INTRAVENOUS

## 2017-02-20 MED ORDER — ONDANSETRON HCL 4 MG PO TABS: 4.0000 mg | ORAL_TABLET | Freq: Four times a day (QID) | ORAL | Status: DC | PRN

## 2017-02-20 MED ORDER — CLONAZEPAM 0.5 MG PO TABS
0.5000 mg | ORAL_TABLET | Freq: Three times a day (TID) | ORAL | Status: DC
  Administered 2017-02-20 – 2017-02-23 (×8): 0.5 mg via ORAL
  Filled 2017-02-20 (×8): qty 1

## 2017-02-20 MED ORDER — TIOTROPIUM BROMIDE MONOHYDRATE 18 MCG IN CAPS
18.0000 ug | ORAL_CAPSULE | Freq: Every day | RESPIRATORY_TRACT | Status: DC
  Administered 2017-02-21 – 2017-02-22 (×2): 18 ug via RESPIRATORY_TRACT
  Filled 2017-02-20: qty 5

## 2017-02-20 MED ORDER — IPRATROPIUM BROMIDE 0.02 % IN SOLN
1.0000 mg | Freq: Once | RESPIRATORY_TRACT | Status: AC
  Administered 2017-02-20: 1 mg via RESPIRATORY_TRACT
  Filled 2017-02-20: qty 5

## 2017-02-20 MED ORDER — INSULIN ASPART 100 UNIT/ML ~~LOC~~ SOLN
0.0000 [IU] | Freq: Three times a day (TID) | SUBCUTANEOUS | Status: DC
  Administered 2017-02-21 (×2): 5 [IU] via SUBCUTANEOUS
  Administered 2017-02-21: 8 [IU] via SUBCUTANEOUS
  Administered 2017-02-22: 15 [IU] via SUBCUTANEOUS
  Administered 2017-02-22: 8 [IU] via SUBCUTANEOUS
  Administered 2017-02-22 – 2017-02-23 (×2): 11 [IU] via SUBCUTANEOUS

## 2017-02-20 MED ORDER — ALBUTEROL SULFATE (2.5 MG/3ML) 0.083% IN NEBU: 2.5000 mg | INHALATION_SOLUTION | RESPIRATORY_TRACT | Status: DC | PRN

## 2017-02-20 MED ORDER — MOMETASONE FURO-FORMOTEROL FUM 200-5 MCG/ACT IN AERO
INHALATION_SPRAY | RESPIRATORY_TRACT | Status: AC
  Filled 2017-02-20: qty 8.8

## 2017-02-20 MED ORDER — MOMETASONE FURO-FORMOTEROL FUM 200-5 MCG/ACT IN AERO
2.0000 | INHALATION_SPRAY | Freq: Two times a day (BID) | RESPIRATORY_TRACT | Status: DC
  Administered 2017-02-21 – 2017-02-22 (×4): 2 via RESPIRATORY_TRACT
  Filled 2017-02-20: qty 8.8

## 2017-02-20 MED ORDER — INSULIN ASPART 100 UNIT/ML ~~LOC~~ SOLN
0.0000 [IU] | Freq: Every day | SUBCUTANEOUS | Status: DC
  Administered 2017-02-20 – 2017-02-22 (×3): 2 [IU] via SUBCUTANEOUS

## 2017-02-20 MED ORDER — DEXTROSE 5 % IV SOLN
INTRAVENOUS | Status: AC
  Filled 2017-02-20: qty 500

## 2017-02-20 MED ORDER — PREGABALIN 50 MG PO CAPS
50.0000 mg | ORAL_CAPSULE | Freq: Three times a day (TID) | ORAL | Status: DC
  Administered 2017-02-20 – 2017-02-23 (×8): 50 mg via ORAL
  Filled 2017-02-20 (×8): qty 1

## 2017-02-20 MED ORDER — DEXTROSE 5 % IV SOLN
500.0000 mg | INTRAVENOUS | Status: DC
  Administered 2017-02-20 – 2017-02-22 (×3): 500 mg via INTRAVENOUS
  Filled 2017-02-20 (×4): qty 500

## 2017-02-20 MED ORDER — ENOXAPARIN SODIUM 40 MG/0.4ML ~~LOC~~ SOLN
40.0000 mg | SUBCUTANEOUS | Status: DC
  Administered 2017-02-20 – 2017-02-22 (×3): 40 mg via SUBCUTANEOUS
  Filled 2017-02-20 (×3): qty 0.4

## 2017-02-20 MED ORDER — ACETAMINOPHEN 325 MG PO TABS
650.0000 mg | ORAL_TABLET | Freq: Four times a day (QID) | ORAL | Status: DC | PRN
  Administered 2017-02-21 (×2): 650 mg via ORAL
  Filled 2017-02-20 (×2): qty 2

## 2017-02-20 MED ORDER — ORAL CARE MOUTH RINSE
15.0000 mL | Freq: Two times a day (BID) | OROMUCOSAL | Status: DC
Start: 2017-02-21 — End: 2017-02-23
  Administered 2017-02-21 – 2017-02-22 (×4): 15 mL via OROMUCOSAL

## 2017-02-20 MED ORDER — ACETAMINOPHEN 650 MG RE SUPP: 650.0000 mg | Freq: Four times a day (QID) | RECTAL | Status: DC | PRN

## 2017-02-20 MED ORDER — OXYCODONE HCL ER 10 MG PO T12A
10.0000 mg | EXTENDED_RELEASE_TABLET | Freq: Two times a day (BID) | ORAL | Status: DC
  Administered 2017-02-20 – 2017-02-23 (×6): 10 mg via ORAL
  Filled 2017-02-20 (×6): qty 1

## 2017-02-20 MED ORDER — LISINOPRIL 10 MG PO TABS
20.0000 mg | ORAL_TABLET | Freq: Every day | ORAL | Status: DC
  Administered 2017-02-21 – 2017-02-23 (×3): 20 mg via ORAL
  Filled 2017-02-20 (×3): qty 2

## 2017-02-20 NOTE — ED Notes (Signed)
Pt ambulatory to door with home oxygen 3 liters .  Sats down to 85%.  Pt c/o sob while ambulating.

## 2017-02-20 NOTE — H&P (Signed)
History and Physical    [name]:[id number] DOB: 07/28/1966 DOA: 02/20/2017  PCP: Fredirick Maudlin, MD  Patient coming from: Home.    Chief Complaint:   Green sputum productive cough.  SOB.    HPI:   [name] is a 51 y.o. female with medical history significant for COPD with chronic hypoxic respiratory failure on 3L home oxygen, hx of low back pain on chronic narcotics, anxiety on low dose benzo,  GERD, and hypertension who presents the emergency department with several days of progressive dyspnea, increased cough with increased green sputum production.  She unfortunately still smoke. No distant travel or ill contact.  No chest pain.   She has no flu like symptoms.  In the ambulance, she was given neb and IV steroids, felt better.  In the ER, evaluation included CXR showing no PNA, and leukolcytosis with WBC of 19K.  She has hyponatremic hypochloremic metabolic alkalosis, mild.  She was given more nebs, and she desat to 85% on ambulation, so hospitalist was asked to admit her for COPD exacerbation.  She is a patient of Dr Juanetta Gosling.   ED Course:  See above.  Rewiew of Systems:  Constitutional: Negative for malaise, fever and chills. No significant weight loss or weight gain Eyes: Negative for eye pain, redness and discharge, diplopia, visual changes, or flashes of light. ENMT: Negative for ear pain, hoarseness, nasal congestion, sinus pressure and sore throat. No headaches; tinnitus, drooling, or problem swallowing. Cardiovascular: Negative for chest pain, palpitations, diaphoresis, dyspnea and peripheral edema. ; No orthopnea, PND Respiratory: Negative for cough, hemoptysis, and stridor. No pleuritic chestpain. Gastrointestinal: Negative for diarrhea, constipation,  melena, blood in stool, hematemesis, jaundice and rectal bleeding.    Genitourinary: Negative for frequency, dysuria, incontinence,flank pain and hematuria; Musculoskeletal: Negative for back pain and neck pain.  Negative for swelling and trauma.;  Skin: . Negative for pruritus, rash, abrasions, bruising and skin lesion.; ulcerations Neuro: Negative for headache, lightheadedness and neck stiffness. Negative for weakness, altered level of consciousness , altered mental status, extremity weakness, burning feet, involuntary movement, seizure and syncope.  Psych: negative for anxiety, depression, insomnia, tearfulness, panic attacks, hallucinations, paranoia, suicidal or homicidal ideation   Past Medical History:  Diagnosis Date  . Angina   . Anxiety state 10/15/2015  . ARDS (adult respiratory distress syndrome) [facility])    Jan 2011  . Asthma   . Chronic back pain   . COPD (chronic obstructive pulmonary disease) (HCC)   . Diabetes mellitus   . GERD (gastroesophageal reflux disease) 12/21/2012  . HTN (hypertension) 10/15/2015  . Hyperglycemia, drug-induced    steroid induced hyperglycemia  . Migraine headache   . On home O2   . Pneumonia   . Recurrent upper respiratory infection (URI)   . Shortness of breath     Past Surgical History:  Procedure Laterality Date  . c-section    . TRACHEOSTOMY     decannulated 12/2009  . TUBAL LIGATION    . uterine ablasion       reports that she has been smoking Cigarettes.  She has a 8.25 pack-year smoking history. She has never used smokeless tobacco. She reports that she does not drink alcohol or use drugs.  Allergies  Allergen Reactions  . Penicillins Other (See Comments)    convulsions  . Levaquin [Levofloxacin In D5w] Itching and Swelling  . Morphine Nausea Only    Family History  Problem Relation Age of Onset  . Coronary artery disease Brother   .  Diabetes Other   . Cancer Other   . Hypertension Other      Prior to Admission medications   Medication Sig Start Date End Date Taking? Authorizing Provider  acetaminophen (TYLENOL) 500 MG tablet Take 500 mg by mouth every 6 (six) hours as needed for fever.    Yes Historical Provider, MD    albuterol (PROAIR HFA) 108 (90 BASE) MCG/ACT inhaler Inhale 2 puffs into the lungs every 6 (six) hours as needed. For shortness of breath   Yes Historical Provider, MD  albuterol (PROVENTIL) (2.5 MG/3ML) 0.083% nebulizer solution Take 2.5 mg by nebulization every 4 (four) hours as needed. For shortness of breath  03/19/11  Yes Solei S Parrett, NP  budesonide-formoterol (SYMBICORT) 160-4.5 MCG/ACT inhaler Inhale 2 puffs into the lungs 2 (two) times daily.   Yes Historical Provider, MD  clonazePAM (KLONOPIN) 0.5 MG tablet Take 0.5 mg by mouth 3 (three) times daily.    Yes Historical Provider, MD  lisinopril (PRINIVIL,ZESTRIL) 20 MG tablet Take 20 mg by mouth daily.   Yes Historical Provider, MD  OxyCODONE (OXYCONTIN) 10 mg T12A Take 1 tablet (10 mg total) by mouth every 12 (twelve) hours. 04/24/13  Yes Kari Baars, MD  oxyCODONE (ROXICODONE) 15 MG immediate release tablet Take 15 mg by mouth every 4 (four) hours.   Yes Historical Provider, MD  pantoprazole (PROTONIX) 40 MG tablet Take 40 mg by mouth daily.   Yes Historical Provider, MD  pregabalin (LYRICA) 50 MG capsule Take 50-100 mg by mouth every 8 (eight) hours. Also take two in the evening   Yes Historical Provider, MD  promethazine (PHENERGAN) 25 MG tablet Take 25 mg by mouth every 6 (six) hours as needed for nausea.    Yes Historical Provider, MD  SUMAtriptan (IMITREX) 50 MG tablet Take 50 mg by mouth daily as needed for migraine or headache.  08/28/16  Yes Historical Provider, MD  tiotropium (SPIRIVA) 18 MCG inhalation capsule Place 18 mcg into inhaler and inhale daily.   Yes Historical Provider, MD    Physical Exam: Vitals:   02/20/17 1600 02/20/17 1615 02/20/17 1630 02/20/17 1645  BP: 113/64  107/69   Pulse: 99 (!) 101  (!) 110  Resp:      Temp:      TempSrc:      SpO2: 95% 95%  96%  Weight:      Height:        Constitutional: NAD, calm, comfortable Vitals:   02/20/17 1600 02/20/17 1615 02/20/17 1630 02/20/17 1645  BP: 113/64   107/69   Pulse: 99 (!) 101  (!) 110  Resp:      Temp:      TempSrc:      SpO2: 95% 95%  96%  Weight:      Height:       Eyes: PERRL, lids and conjunctivae normal ENMT: Mucous membranes are moist. Posterior pharynx clear of any exudate or lesions.Normal dentition.  Neck: normal, supple, no masses, no thyromegaly Respiratory: mild bilateral wheezing with, no crackles. Normal respiratory effort. No accessory muscle use.  Cardiovascular: Regular rate and rhythm, no murmurs / rubs / gallops. No extremity edema. 2+ pedal pulses. No carotid bruits.  Abdomen: no tenderness, no masses palpated. No hepatosplenomegaly. Bowel sounds positive.  Musculoskeletal: no clubbing / cyanosis. No joint deformity upper and lower extremities. Good ROM, no contractures. Normal muscle tone.  Skin: no rashes, lesions, ulcers. No induration Neurologic: CN 2-12 grossly intact. Sensation intact, DTR normal. Strength 5/5 in  all 4.  Psychiatric: Normal judgment and insight. Alert and oriented x 3. Normal mood.   Labs on Admission: I have personally reviewed following labs and imaging studies CBC:  Recent Labs Lab 02/20/17 1546  WBC 19.6*  NEUTROABS 17.7*  HGB 12.5  HCT 38.3  MCV 86.1  PLT 393   Basic Metabolic Panel:  Recent Labs Lab 02/20/17 1546  NA 133*  K 3.6  CL 85*  CO2 37*  GLUCOSE 172*  BUN 10  CREATININE 0.38*  CALCIUM 9.2   Urine analysis:    Component Value Date/Time   COLORURINE YELLOW 09/28/2016 1257   APPEARANCEUR CLEAR 09/28/2016 1257   LABSPEC >1.030 (H) 09/28/2016 1257   PHURINE 6.0 09/28/2016 1257   GLUCOSEU 250 (A) 09/28/2016 1257   HGBUR MODERATE (A) 09/28/2016 1257   BILIRUBINUR NEGATIVE 09/28/2016 1257   KETONESUR TRACE (A) 09/28/2016 1257   PROTEINUR 100 (A) 09/28/2016 1257   UROBILINOGEN 1.0 07/24/2014 1605   NITRITE NEGATIVE 09/28/2016 1257   LEUKOCYTESUR NEGATIVE 09/28/2016 1257    Radiological Exams on Admission: Dg Chest 2 View  Result Date:  02/20/2017 CLINICAL DATA:  Shortness of breath, cough, nasal congestion since yesterday EXAM: CHEST  2 VIEW COMPARISON:  09/28/2016 FINDINGS: Normal heart size, mediastinal contours, and pulmonary vascularity. Patchy infiltrates at the mid lower lungs bilaterally appear little changed from the previous exam. No definite superimposed infiltrate, pleural effusion or pneumothorax. Bones demineralized. IMPRESSION: Chronic increase in pulmonary markings in the mid to lower lungs bilaterally, greater at the anterior lung bases, favoring chronic interstitial lung disease ; these were shown to be in part tree-in-bud opacities on an interval CT chest question chronic infectious or inflammatory process as previously discussed. Electronically Signed   By: Ulyses Southward M.D.   On: 02/20/2017 15:59    Assessment/Plan Principal Problem:   Acute-on-chronic respiratory failure (HCC) Active Problems:   MAJOR DPRSV DISORDER RECURRENT EPISODE MODERATE   Low back pain   DM (diabetes mellitus) (HCC)   COPD exacerbation (HCC)   HTN (hypertension)   Chronic obstructive pulmonary disease (HCC)   PLAN:   COPD exacerbation:  Will continue with IV Steroids, nebs, and add IV Zithromax to her regimen.  Increase Oxyygen to 4L [place].  Admit OBS.  Chronic pain:  WIll continue long acting narcotic.  Reduce short acting to  of oxycodone to Q6 hours PRN.  HTN:  Continue with meds.  DM:  With IV steroids, will place on Moderate coverages. Carb modified diet.   Tobacco abuse:  Advised stop, but low probability.   DVT prophylaxis: lovenox.  Code Status: FULL CODE.  Family Communication: husband at bedside.  Disposition Plan: Likely to home.  Consults called: None.  Admission status: OBS.    Erika Slaby MD FACP. [facility]  If 7PM-7AM, please contact night-coverage [email or web address] Password [facility]  02/20/2017, 7:36 PM

## 2017-02-20 NOTE — ED Triage Notes (Signed)
Sob,  Cough and nasal congestion  Since yesterday

## 2017-02-20 NOTE — ED Notes (Signed)
Pt is saying that she is too weak to get up and walk at this time. Pt said I will be "picking her up off the floor because she will fall if she were to walk."

## 2017-02-20 NOTE — ED Triage Notes (Signed)
EMS gave albuteral treatment and 125 mg solumedrol.  Pt on 2 liters [place].

## 2017-02-20 NOTE — ED Provider Notes (Signed)
[facility]-EMERGENCY DEPT Provider Note   CSN: [id number] Arrival date & time: 02/20/17  1424     History   Chief Complaint Chief Complaint  Patient presents with  . Shortness of Breath    HPI [name] is a 51 y.o. female.  HPI  Pt was seen at 1520.  Per pt, c/o gradual onset and worsening of persistent cough, wheezing and SOB for the past 3 days.  Describes her symptoms as "my COPD." Has been associated with home temps to "100.9," and "green" sputum.  Has been using home O2 N/C, MDI and nebs without relief.  Denies CP/palpitations, no back pain, no abd pain, no N/V/D, no fevers, no rash. EMS gave short neb and IV solumedrol en route with "some" improvement.    Past Medical History:  Diagnosis Date  . Angina   . Anxiety state 10/15/2015  . ARDS (adult respiratory distress syndrome) [facility])    Jan 2011  . Asthma   . Chronic back pain   . COPD (chronic obstructive pulmonary disease) (HCC)   . Diabetes mellitus   . GERD (gastroesophageal reflux disease) 12/21/2012  . HTN (hypertension) 10/15/2015  . Hyperglycemia, drug-induced    steroid induced hyperglycemia  . Migraine headache   . On home O2   . Pneumonia   . Recurrent upper respiratory infection (URI)   . Shortness of breath     Patient Active Problem List   Diagnosis Date Noted  . Atypical pneumonia 09/28/2016  . Chronic obstructive pulmonary disease (HCC)   . Pneumonia 05/30/2016  . SIRS (systemic inflammatory response syndrome) (HCC) 10/16/2015  . Sepsis (HCC) 10/15/2015  . Anxiety state 10/15/2015  . HTN (hypertension) 10/15/2015  . Hypokalemia 10/15/2015  . COPD with acute exacerbation (HCC) 08/14/2014  . Acute-on-chronic respiratory failure (HCC) 03/13/2014  . CAP (community acquired pneumonia) 03/11/2014  . Community acquired pneumonia 03/11/2014  . COPD exacerbation (HCC) 04/22/2013  . GERD (gastroesophageal reflux disease) 12/21/2012  . Constipation 12/21/2012  . Healthcare-associated pneumonia  02/03/2012  . Candida infection 11/26/2011  . Viral syndrome 11/21/2011  . DM (diabetes mellitus) (HCC) 11/21/2011  . Thrush, oral 07/03/2011  . Low back pain 06/16/2011  . Muscle spasms of lower extremity 06/16/2011  . TOBACCO ABUSE 09/12/2010  . 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 COPD (chronic obstructive pulmonary disease) (HCC) 02/14/2010  . UNSPECIFIED NUTRITIONAL DEFICIENCY 02/13/2010  . ADULT RESPIRATORY DISTRESS SYNDROME 02/13/2010  . DELIRIUM 02/13/2010    Past Surgical History:  Procedure Laterality Date  . c-section    . TRACHEOSTOMY     decannulated 12/2009  . TUBAL LIGATION    . uterine ablasion      OB History    No data available       Home Medications    Prior to Admission medications   Medication Sig Start Date End Date Taking? Authorizing Provider  acetaminophen (TYLENOL) 500 MG tablet Take 500 mg by mouth every 6 (six) hours as needed for fever.     Historical Provider, MD  albuterol (PROAIR HFA) 108 (90 BASE) MCG/ACT inhaler Inhale 2 puffs into the lungs every 6 (six) hours as needed. For shortness of breath    Historical Provider, MD  albuterol (PROVENTIL) (2.5 MG/3ML) 0.083% nebulizer solution Take 2.5 mg by nebulization every 4 (four) hours as needed. For shortness of breath  03/19/11   Julio Sicks, NP  azithromycin (ZITHROMAX Z-PAK) 250 MG tablet Take by package instructions 10/01/16   Kari Baars, MD  budesonide-formoterol (SYMBICORT) 160-4.5 MCG/ACT inhaler Inhale 2 puffs into the lungs 2 (two) times daily.    Historical Provider, MD  cephALEXin (KEFLEX) 500 MG capsule Take 1 capsule (500 mg total) by mouth 3 (three) times daily. 10/01/16   Kari Baars, MD  clonazePAM (KLONOPIN) 0.5 MG tablet Take 0.5 mg by mouth 3 (three) times daily.     Historical Provider, MD  fluticasone (FLONASE) 50 MCG/ACT nasal spray Place 2 sprays into both nostrils daily. 09/21/16   Historical Provider, MD  ibuprofen (ADVIL,MOTRIN) 200 MG  tablet Take 400 mg by mouth every 6 (six) hours as needed for fever or moderate pain.    Historical Provider, MD  lisinopril (PRINIVIL,ZESTRIL) 20 MG tablet Take 20 mg by mouth daily.    Historical Provider, MD  metFORMIN (GLUCOPHAGE) 500 MG tablet Take 1 tablet (500 mg total) by mouth 2 (two) times daily with a meal. 10/01/16   Kari Baars, MD  OxyCODONE (OXYCONTIN) 10 mg T12A Take 1 tablet (10 mg total) by mouth every 12 (twelve) hours. 04/24/13   Kari Baars, MD  oxyCODONE (ROXICODONE) 15 MG immediate release tablet Take 15 mg by mouth every 4 (four) hours.    Historical Provider, MD  pantoprazole (PROTONIX) 40 MG tablet Take 40 mg by mouth daily.    Historical Provider, MD  predniSONE (STERAPRED UNI-PAK 21 TAB) 10 MG (21) TBPK tablet Take by package instructions 10/01/16   Kari Baars, MD  pregabalin (LYRICA) 50 MG capsule Take 50-100 mg by mouth every 8 (eight) hours. 1 twice daily and two in the evening    Historical Provider, MD  promethazine (PHENERGAN) 25 MG tablet Take 25 mg by mouth every 6 (six) hours as needed for nausea.     Historical Provider, MD  RESTASIS 0.05 % ophthalmic emulsion Place 1 drop into both eyes 2 (two) times daily. 09/28/15   Historical Provider, MD  SUMAtriptan (IMITREX) 50 MG tablet Take 50 mg by mouth daily as needed for migraine or headache.  08/28/16   Historical Provider, MD  tiotropium (SPIRIVA) 18 MCG inhalation capsule Place 18 mcg into inhaler and inhale daily.    Historical Provider, MD    Family History Family History  Problem Relation Age of Onset  . Coronary artery disease Brother   . Diabetes Other   . Cancer Other   . Hypertension Other     Social History Social History  Substance Use Topics  . Smoking status: Current Every Day Smoker    Packs/day: 0.25    Years: 33.00    Types: Cigarettes  . Smokeless tobacco: Never Used     Comment: 1/2 pack a day  since age 67  . Alcohol use No     Allergies   Penicillins; Levaquin  [levofloxacin in d5w]; and Morphine   Review of Systems Review of Systems ROS: Statement: All systems negative except as marked or noted in the HPI; Constitutional: Negative for fever and chills. ; ; Eyes: Negative for eye pain, redness and discharge. ; ; ENMT: Negative for ear pain, hoarseness, nasal congestion, sinus pressure and sore throat. ; ; Cardiovascular: Negative for chest pain, palpitations, diaphoresis, and peripheral edema. ; ; Respiratory: +cough, wheezing, SOB. Negative for stridor. ; ; Gastrointestinal: Negative for nausea, vomiting, diarrhea, abdominal pain, blood in stool, hematemesis, jaundice and rectal bleeding. . ; ; Genitourinary: Negative for dysuria, flank pain and hematuria. ; ; Musculoskeletal: Negative for back pain and neck pain. Negative for swelling and trauma.; ; Skin: Negative for pruritus,  rash, abrasions, blisters, bruising and skin lesion.; ; Neuro: Negative for headache, lightheadedness and neck stiffness. Negative for weakness, altered level of consciousness, altered mental status, extremity weakness, paresthesias, involuntary movement, seizure and syncope.       Physical Exam Updated Vital Signs BP 115/76   Pulse (!) 104   Temp 99 F (37.2 C) (Oral)   Resp 20   Ht 5' (1.524 m)   Wt 140 lb (63.5 kg)   SpO2 93%   BMI 27.34 kg/m   Patient Vitals for the past 24 hrs:  BP Temp Temp src Pulse Resp SpO2 Height Weight  02/20/17 1645 - - - (!) 110 - 96 % - -  02/20/17 1630 107/69 - - - - - - -  02/20/17 1615 - - - (!) 101 - 95 % - -  02/20/17 1600 113/64 - - 99 - 95 % - -  02/20/17 1551 - - - - - (!) 87 % - -  02/20/17 1530 115/76 - - (!) 104 - 93 % - -  02/20/17 1515 - - - (!) 107 - 94 % - -  02/20/17 1500 104/64 - - (!) 105 - 93 % - -  02/20/17 1429 (!) 89/71 99 F (37.2 C) Oral (!) 108 20 91 % 5' (1.524 m) 140 lb (63.5 kg)      Physical Exam 1525: Physical examination:  Nursing notes reviewed; Vital signs and O2 SAT reviewed;  Constitutional:  Well developed, Well nourished, Well hydrated, Uncomfortable appearing.;; Head:  Normocephalic, atraumatic; Eyes: EOMI, PERRL, No scleral icterus; ENMT: Mouth and pharynx normal, Mucous membranes moist; Neck: Supple, Full range of motion, No lymphadenopathy; Cardiovascular: Tachycardic rate and rhythm, No gallop; Respiratory: Breath sounds diminished & equal bilaterally, insp/exp wheezes bilat. No audible wheezing.  Speaking short sentences, sitting upright, tachypneic. +moist cough with thick yellow sputum.; Chest: Nontender, Movement normal; Abdomen: Soft, Nontender, Nondistended, Normal bowel sounds; Genitourinary: No CVA tenderness; Extremities: Pulses normal, No tenderness, No edema, No calf edema or asymmetry.; Neuro: AA&Ox3, Major CN grossly intact.  Speech clear. No gross focal motor or sensory deficits in extremities.; Skin: Color normal, Warm, Dry.   ED Treatments / Results  Labs (all labs ordered are listed, but only abnormal results are displayed)   EKG  EKG Interpretation None       Radiology   Procedures Procedures (including critical care time)  Medications Ordered in ED Medications  albuterol (PROVENTIL,VENTOLIN) solution continuous neb (not administered)  ipratropium (ATROVENT) nebulizer solution 1 mg (not administered)  sodium chloride 0.9 % bolus 1,000 mL (not administered)  oxyCODONE (Oxy IR/ROXICODONE) immediate release tablet 15 mg (not administered)     Initial Impression / Assessment and Plan / ED Course  I have reviewed the triage vital signs and the nursing notes.  Pertinent labs & imaging results that were available during my care of the patient were reviewed by me and considered in my medical decision making (see chart for details).  MDM Reviewed: previous chart, nursing note and vitals Reviewed previous: labs Interpretation: labs and x-ray Total time providing critical care: 30-74 minutes. This excludes time spent performing separately reportable  procedures and services. Consults: admitting MD   CRITICAL CARE Performed by: Laray Anger Total critical care time: 35 minutes Critical care time was exclusive of separately billable procedures and treating other patients. Critical care was necessary to treat or prevent imminent or life-threatening deterioration. Critical care was time spent personally by me on the following activities: development of treatment plan  with patient and/or surrogate as well as nursing, discussions with consultants, evaluation of patient's response to treatment, examination of patient, obtaining history from patient or surrogate, ordering and performing treatments and interventions, ordering and review of laboratory studies, ordering and review of radiographic studies, pulse oximetry and re-evaluation of patient's condition.  Results for orders placed or performed during the hospital encounter of 02/20/17  Basic metabolic panel  Result Value Ref Range   Sodium 133 (L) 135 - 145 mmol/L   Potassium 3.6 3.5 - 5.1 mmol/L   Chloride 85 (L) 101 - 111 mmol/L   CO2 37 (H) 22 - 32 mmol/L   Glucose, Bld 172 (H) 65 - 99 mg/dL   BUN 10 6 - 20 mg/dL   Creatinine, Ser 9.60 (L) 0.44 - 1.00 mg/dL   Calcium 9.2 8.9 - 45.4 mg/dL   GFR calc non Af Amer >60 >60 mL/min   GFR calc Af Amer >60 >60 mL/min   Anion gap 11 5 - 15  CBC with Differential  Result Value Ref Range   WBC 19.6 (H) 4.0 - 10.5 K/uL   RBC 4.45 3.87 - 5.11 MIL/uL   Hemoglobin 12.5 12.0 - 15.0 g/dL   HCT 09.8 11.9 - 14.7 %   MCV 86.1 78.0 - 100.0 fL   MCH 28.1 26.0 - 34.0 pg   MCHC 32.6 30.0 - 36.0 g/dL   RDW 82.9 56.2 - 13.0 %   Platelets 393 150 - 400 K/uL   Neutrophils Relative % 91 %   Neutro Abs 17.7 (H) 1.7 - 7.7 K/uL   Lymphocytes Relative 5 %   Lymphs Abs 1.0 0.7 - 4.0 K/uL   Monocytes Relative 4 %   Monocytes Absolute 0.8 0.1 - 1.0 K/uL   Eosinophils Relative 0 %   Eosinophils Absolute 0.0 0.0 - 0.7 K/uL   Basophils Relative 0 %    Basophils Absolute 0.0 0.0 - 0.1 K/uL   Dg Chest 2 View Result Date: 02/20/2017 CLINICAL DATA:  Shortness of breath, cough, nasal congestion since yesterday EXAM: CHEST  2 VIEW COMPARISON:  09/28/2016 FINDINGS: Normal heart size, mediastinal contours, and pulmonary vascularity. Patchy infiltrates at the mid lower lungs bilaterally appear little changed from the previous exam. No definite superimposed infiltrate, pleural effusion or pneumothorax. Bones demineralized. IMPRESSION: Chronic increase in pulmonary markings in the mid to lower lungs bilaterally, greater at the anterior lung bases, favoring chronic interstitial lung disease ; these were shown to be in part tree-in-bud opacities on an interval CT chest question chronic infectious or inflammatory process as previously discussed. Electronically Signed   By: Ulyses Southward M.D.   On: 02/20/2017 15:59    1525:  Pt requesting "some pain meds" for her chronic pain. Will dose pt's usual oxycodone, pt aware.   1905:   On arrival: pt sitting upright, tachypneic, tachycardic, Sats 87-91 % on her usual O2 3L N/C, lungs diminished with wheezing. IV solumedrol and hour long neb started. After neb: pt appears more comfortable at rest, less tachypneic, Sats 90-94 % on O2 3L N/C, lungs continue diminished. Pt ambulated in her exam room from the bed to the doorway (did not make it into the hallway): pt's O2 Sats dropped to 85 % on her usual O2 3L N/C with pt c/o increasing SOB, with increasing HR and RR. Pt escorted back to stretcher with O2 Sats slowly increasing to 96 % on O2 3L N/C.  Dx and testing d/w pt and family.  Questions answered.  Verb  understanding, agreeable to admit. T/C to [facility]. Conley Rolls, case discussed, including:  HPI, pertinent PM/SHx, VS/PE, dx testing, ED course and treatment:  Agreeable to come to ED for evaluation.     Final Clinical Impressions(s) / ED Diagnoses   Final diagnoses:  None    New Prescriptions New Prescriptions   No  medications on file     Samuel Jester, DO 02/23/17 2004

## 2017-02-21 LAB — BASIC METABOLIC PANEL
ANION GAP: 11 (ref 5–15)
BUN: 9 mg/dL (ref 6–20)
CHLORIDE: 91 mmol/L — AB (ref 101–111)
CO2: 37 mmol/L — AB (ref 22–32)
Calcium: 9.5 mg/dL (ref 8.9–10.3)
Creatinine, Ser: 0.37 mg/dL — ABNORMAL LOW (ref 0.44–1.00)
GFR calc Af Amer: >60 mL/min (ref >60)
GLUCOSE: 180 mg/dL — AB (ref 65–99)
POTASSIUM: 4 mmol/L (ref 3.5–5.1)
Sodium: 139 mmol/L (ref 135–145)

## 2017-02-21 LAB — GLUCOSE, CAPILLARY
GLUCOSE-CAPILLARY: 278 mg/dL — AB (ref 65–99)
GLUCOSE-CAPILLARY: 228 mg/dL — AB (ref 65–99)
Glucose-Capillary: 245 mg/dL — ABNORMAL HIGH (ref 65–99)
Glucose-Capillary: 236 mg/dL — ABNORMAL HIGH (ref 65–99)

## 2017-02-21 LAB — TSH: TSH: 0.229 u[IU]/mL — ABNORMAL LOW (ref 0.350–4.500)

## 2017-02-21 MED ORDER — OXYCODONE HCL 5 MG PO TABS
10.0000 mg | ORAL_TABLET | Freq: Four times a day (QID) | ORAL | Status: DC | PRN
  Administered 2017-02-22: 10 mg via ORAL
  Filled 2017-02-21: qty 2

## 2017-02-21 MED ORDER — NICOTINE 21 MG/24HR TD PT24
21.0000 mg | MEDICATED_PATCH | Freq: Every day | TRANSDERMAL | Status: DC
Start: 2017-02-21 — End: 2017-02-23
  Administered 2017-02-21 – 2017-02-22 (×2): 21 mg via TRANSDERMAL
  Filled 2017-02-21 (×4): qty 1

## 2017-02-21 MED ORDER — OXYCODONE HCL 5 MG PO TABS
5.0000 mg | ORAL_TABLET | ORAL | Status: DC | PRN
  Administered 2017-02-21 – 2017-02-23 (×8): 5 mg via ORAL
  Filled 2017-02-21 (×8): qty 1

## 2017-02-21 NOTE — Progress Notes (Signed)
Patient with chronic hypoxia due to interstitial lung disease no new infiltrates noted on chest x-ray currently on steroids nebulizer she is fixated on opioid analgesia. She is currently smoking 1 pack per day on nicotine patch MARION SEESE ZOX:[id number] DOB: 1966-07-11 DOA: 02/20/2017 PCP: Fredirick Maudlin, MD   Physical Exam: Blood pressure 118/72, pulse 95, temperature 98.2 F (36.8 C), temperature source Oral, resp. rate 18, height 5' (1.524 m), weight 56.3 kg (124 lb 1.9 oz), SpO2 92 %. Lungs show prolonged expiratory phase scattered rhonchi mild end expiratory wheeze noted no rales audible heart regular rhythm no S3-S4 no heaves thrills rubs abdomen soft nontender bowel sounds normoactive   Investigations:  No results found for this or any previous visit (from the past 240 hour(s)).   Basic Metabolic Panel:  Recent Labs  81/19/14 1546 02/21/17 0617  NA 133* 139  K 3.6 4.0  CL 85* 91*  CO2 37* 37*  GLUCOSE 172* 180*  BUN 10 9  CREATININE 0.38* 0.37*  CALCIUM 9.2 9.5   Liver Function Tests: No results for input(s): AST, ALT, ALKPHOS, BILITOT, PROT, ALBUMIN in the last 72 hours.   CBC:  Recent Labs  02/20/17 1546  WBC 19.6*  NEUTROABS 17.7*  HGB 12.5  HCT 38.3  MCV 86.1  PLT 393    Dg Chest 2 View  Result Date: 02/20/2017 CLINICAL DATA:  Shortness of breath, cough, nasal congestion since yesterday EXAM: CHEST  2 VIEW COMPARISON:  09/28/2016 FINDINGS: Normal heart size, mediastinal contours, and pulmonary vascularity. Patchy infiltrates at the mid lower lungs bilaterally appear little changed from the previous exam. No definite superimposed infiltrate, pleural effusion or pneumothorax. Bones demineralized. IMPRESSION: Chronic increase in pulmonary markings in the mid to lower lungs bilaterally, greater at the anterior lung bases, favoring chronic interstitial lung disease ; these were shown to be in part tree-in-bud opacities on an interval CT chest question chronic  infectious or inflammatory process as previously discussed. Electronically Signed   By: Ulyses Southward M.D.   On: 02/20/2017 15:59      Medications:   Impression:  Principal Problem:   Acute-on-chronic respiratory failure (HCC) Active Problems:   MAJOR DPRSV DISORDER RECURRENT EPISODE MODERATE   Low back pain   DM (diabetes mellitus) (HCC)   COPD exacerbation (HCC)   HTN (hypertension)   Chronic obstructive pulmonary disease (HCC)     Plan: Resume oxycodone 5 mg every 4 hours and addition to long-acting opioids which is one half the dosage she was taking at home. Continue Zithromax as ordered continue steroids as ordered continue nebulizers as ordered monitor CBC in a.m.   Consultants:    Procedures   Antibiotics: Zithromax          Time spent: 30 minutes   LOS: 1 day   Diamante Truszkowski M   02/21/2017, 12:55 PM

## 2017-02-22 LAB — CBC WITH DIFFERENTIAL/PLATELET
BASOS ABS: 0 10*3/uL (ref 0.0–0.1)
BASOS PCT: 0 %
EOS ABS: 0 10*3/uL (ref 0.0–0.7)
Eosinophils Relative: 0 %
HEMATOCRIT: 37.4 % (ref 36.0–46.0)
Hemoglobin: 12.2 g/dL (ref 12.0–15.0)
LYMPHS PCT: 6 %
Lymphs Abs: 1.3 10*3/uL (ref 0.7–4.0)
MCH: 28 pg (ref 26.0–34.0)
MCHC: 32.6 g/dL (ref 30.0–36.0)
MCV: 86 fL (ref 78.0–100.0)
MONOS PCT: 4 %
Monocytes Absolute: 0.8 10*3/uL (ref 0.1–1.0)
NEUTROS PCT: 90 %
Neutro Abs: 19.1 10*3/uL — ABNORMAL HIGH (ref 1.7–7.7)
Platelets: 494 10*3/uL — ABNORMAL HIGH (ref 150–400)
RBC: 4.35 MIL/uL (ref 3.87–5.11)
RDW: 12 % (ref 11.5–15.5)
WBC: 21.2 10*3/uL — ABNORMAL HIGH (ref 4.0–10.5)

## 2017-02-22 LAB — GLUCOSE, CAPILLARY
GLUCOSE-CAPILLARY: 260 mg/dL — AB (ref 65–99)
GLUCOSE-CAPILLARY: 348 mg/dL — AB (ref 65–99)
GLUCOSE-CAPILLARY: 359 mg/dL — AB (ref 65–99)
GLUCOSE-CAPILLARY: 212 mg/dL — AB (ref 65–99)

## 2017-02-22 LAB — BASIC METABOLIC PANEL
ANION GAP: 10 (ref 5–15)
BUN: 11 mg/dL (ref 6–20)
CO2: 36 mmol/L — ABNORMAL HIGH (ref 22–32)
Calcium: 9.3 mg/dL (ref 8.9–10.3)
Chloride: 88 mmol/L — ABNORMAL LOW (ref 101–111)
Creatinine, Ser: 0.53 mg/dL (ref 0.44–1.00)
Glucose, Bld: 320 mg/dL — ABNORMAL HIGH (ref 65–99)
POTASSIUM: 4.3 mmol/L (ref 3.5–5.1)
Sodium: 134 mmol/L — ABNORMAL LOW (ref 135–145)

## 2017-02-22 LAB — MONONUCLEOSIS SCREEN: Mono Screen: NEGATIVE

## 2017-02-22 MED ORDER — DOCUSATE SODIUM 100 MG PO CAPS
100.0000 mg | ORAL_CAPSULE | Freq: Two times a day (BID) | ORAL | Status: DC
  Administered 2017-02-22 – 2017-02-23 (×2): 100 mg via ORAL
  Filled 2017-02-22 (×2): qty 1

## 2017-02-22 NOTE — Progress Notes (Signed)
Inpatient Diabetes Program Recommendations  AACE/ADA: New Consensus Statement on Inpatient Glycemic Control (2015)  Target Ranges:  Prepandial:   less than 140 mg/dL      Peak postprandial:   less than 180 mg/dL (1-2 hours)      Critically ill patients:  140 - 180 mg/dL   Results for Mceachin, Dashia A (MRN [id number]) as of 02/22/2017 10:29  Ref. Range 02/21/2017 07:44 02/21/2017 11:21 02/21/2017 16:29 02/21/2017 22:00 02/22/2017 07:53  Glucose-Capillary Latest Ref Range: 65 - 99 mg/dL 191 (H) 478 (H) 295 (H) 236 (H) 260 (H)   Review of Glycemic Control  Diabetes history: DM2 Outpatient Diabetes medications: None Current orders for Inpatient glycemic control: Novolog 0-15 units TID with melas,   Inpatient Diabetes Program Recommendations: Insulin - Basal: If steroids are continued, please consider ordering Lantus 6 units Q24H.   Thanks, Orlando Penner, RN, MSN, CDE Diabetes Coordinator Inpatient Diabetes Program [phone] (Team Pager from 8am to 5pm)

## 2017-02-22 NOTE — Progress Notes (Signed)
Patient much, with the addition of opioid analgesia every 4 hours as well as long-acting opiates every 12 hours in the form of OxyContin 10 every 12 hours and oxycodone 5 every 4 hours when necessary this is half the dose she was taking at home. Of note she has atypical lymphocytosis I will do a Monospot checked today lungs are much better less wheeze on steroids nebulizer and empiric Zithromax on admission his interstitial lung disease was currently smoking 1 pack per day up to admission nicotine patch in place [name] MBT:[id number] DOB: January 19, 1966 DOA: 02/20/2017 PCP: Alonza Bogus, MD   Physical Exam: Blood pressure (!) 108/53, pulse 85, temperature 98.3 F (36.8 C), temperature source Oral, resp. rate 18, height 5' (1.524 m), weight 56.3 kg (124 lb 1.9 oz), SpO2 96 %. Lungs show moderate inspiratory and expiratory wheezes diminished breath sounds in the bases no rales audible heart regular rhythm no S3 or S4 no heaves thrills rubs   Investigations:  No results found for this or any previous visit (from the past 240 hour(s)).   Basic Metabolic Panel:  Recent Labs  02/20/17 1546 02/21/17 0617  NA 133* 139  K 3.6 4.0  CL 85* 91*  CO2 37* 37*  GLUCOSE 172* 180*  BUN 10 9  CREATININE 0.38* 0.37*  CALCIUM 9.2 9.5   Liver Function Tests: No results for input(s): AST, ALT, ALKPHOS, BILITOT, PROT, ALBUMIN in the last 72 hours.   CBC:  Recent Labs  02/20/17 1546 02/22/17 0615  WBC 19.6* 21.2*  NEUTROABS 17.7* 19.1*  HGB 12.5 12.2  HCT 38.3 37.4  MCV 86.1 86.0  PLT 393 494*    Dg Chest 2 View  Result Date: 02/20/2017 CLINICAL DATA:  Shortness of breath, cough, nasal congestion since yesterday EXAM: CHEST  2 VIEW COMPARISON:  09/28/2016 FINDINGS: Normal heart size, mediastinal contours, and pulmonary vascularity. Patchy infiltrates at the mid lower lungs bilaterally appear little changed from the previous exam. No definite superimposed infiltrate, pleural effusion or  pneumothorax. Bones demineralized. IMPRESSION: Chronic increase in pulmonary markings in the mid to lower lungs bilaterally, greater at the anterior lung bases, favoring chronic interstitial lung disease ; these were shown to be in part tree-in-bud opacities on an interval CT chest question chronic infectious or inflammatory process as previously discussed. Electronically Signed   By: Lavonia Dana M.D.   On: 02/20/2017 15:59      Medications:  Impression:  Principal Problem:   Acute-on-chronic respiratory failure (HCC) Active Problems:   MAJOR DPRSV DISORDER RECURRENT EPISODE MODERATE   Low back pain   DM (diabetes mellitus) (HCC)   COPD exacerbation (HCC)   HTN (hypertension)   Chronic obstructive pulmonary disease ([place])     Plan: Monospot test and repeat be met and CBC in a.m. due to significant leukocytosis atypical lymphocytosis  Consultants:    Procedures   Antibiotics: Zithromax          Time spent: 30 minutes   LOS: 2 days   Numa Schroeter M   02/22/2017, 9:32 AM

## 2017-02-22 NOTE — Care Management Note (Signed)
Case Management Note  Patient Details  Name: CAOIMHE DAMRON MRN: [id number] Date of Birth: 04-29-1966  Subjective/Objective:  Chart reviewed for CM needs.    Patient adm from home with acute on chronic respiratory failure. She is ind with ADL's. Has home oxygen PTA. She has a PCP, transportation, and medicaid, no issues obtaining medications.                          Action/Plan: Plan for return home at time of discharge.    Expected Discharge Date:     02/23/2017            Expected Discharge Plan:  Home/Self Care  In-House Referral:     Discharge planning Services  CM Consult  Post Acute Care Choice:    Choice offered to:     DME Arranged:    DME Agency:     HH Arranged:    HH Agency:     Status of Service:  In process, will continue to follow  If discussed at Long Length of Stay Meetings, dates discussed:    Additional Comments:  Kennetta Pavlovic, Chrystine Oiler, RN 02/22/2017, 2:27 PM

## 2017-02-23 LAB — CBC WITH DIFFERENTIAL/PLATELET
BASOS ABS: 0 10*3/uL (ref 0.0–0.1)
Basophils Relative: 0 %
EOS ABS: 0 10*3/uL (ref 0.0–0.7)
Eosinophils Relative: 0 %
HCT: 36.3 % (ref 36.0–46.0)
Hemoglobin: 11.9 g/dL — ABNORMAL LOW (ref 12.0–15.0)
LYMPHS ABS: 1.5 10*3/uL (ref 0.7–4.0)
LYMPHS PCT: 8 %
MCH: 28.1 pg (ref 26.0–34.0)
MCHC: 32.8 g/dL (ref 30.0–36.0)
MCV: 85.6 fL (ref 78.0–100.0)
Monocytes Absolute: 0.8 10*3/uL (ref 0.1–1.0)
Monocytes Relative: 4 %
NEUTROS ABS: 17 10*3/uL — AB (ref 1.7–7.7)
Neutrophils Relative %: 88 %
Platelets: 462 10*3/uL — ABNORMAL HIGH (ref 150–400)
RBC: 4.24 MIL/uL (ref 3.87–5.11)
RDW: 11.9 % (ref 11.5–15.5)
WBC: 19.3 10*3/uL — ABNORMAL HIGH (ref 4.0–10.5)

## 2017-02-23 LAB — GLUCOSE, CAPILLARY: Glucose-Capillary: 324 mg/dL — ABNORMAL HIGH (ref 65–99)

## 2017-02-23 MED ORDER — PREDNISONE 10 MG (21) PO TBPK: ORAL_TABLET | ORAL | 1 refills | Status: DC

## 2017-02-23 MED ORDER — SULFAMETHOXAZOLE-TRIMETHOPRIM 800-160 MG PO TABS: 1.0000 | ORAL_TABLET | Freq: Two times a day (BID) | ORAL | 1 refills | Status: DC

## 2017-02-23 NOTE — Progress Notes (Signed)
Fara Chute, RN discharged patient home. Provided with home medication list, discharge instructions and prescriptions. Verbalized understanding. Pt's IV site D/C'd and WDL. Pt's VSS. Pt left floor via WC in stable condition accompanied by staff.

## 2017-02-23 NOTE — Progress Notes (Signed)
Inpatient Diabetes Program Recommendations  AACE/ADA: New Consensus Statement on Inpatient Glycemic Control (2015)  Target Ranges:  Prepandial:   less than 140 mg/dL      Peak postprandial:   less than 180 mg/dL (1-2 hours)      Critically ill patients:  140 - 180 mg/dL   Results for [name], [name] (MRN [id number]) as of 02/23/2017 08:44  Ref. Range 02/22/2017 07:53 02/22/2017 11:32 02/22/2017 16:31 02/22/2017 20:47 02/23/2017 08:06  Glucose-Capillary Latest Ref Range: 65 - 99 mg/dL 409 (H)  Novolog 8 units 348 (H)  Novolog 11 units 359 (H)  Novolog 15 units 212 (H)  Novolog 2 units 324 (H)   Review of Glycemic Control  Diabetes history: DM2 Outpatient Diabetes medications: None Current orders for Inpatient glycemic control: Novolog 0-15 units TID with meals, Novolog 0-5 units QHS  Inpatient Diabetes Program Recommendations: Insulin - Basal: Fasting glucose 324 mg/dl this morning. If steroids are continued, please consider ordering Lantus 10 units Q24H starting now. Insulin - Meal Coverage: If steroids are continued, please consider ordering Novolog 4 units TID with meals for meal coverage if patient eats at least 50% of meals.  Thanks, Orlando Penner, RN, MSN, CDE Diabetes Coordinator Inpatient Diabetes Program [phone] (Team Pager from 8am to 5pm)

## 2017-02-23 NOTE — Progress Notes (Signed)
She says she feels much better and wants to go home. She still coughing but it's mostly nonproductive and she is bringing anything up it's clear. No other new complaints. Her breathing is better.  Physical exam shows that she is back about baseline. Her lungs show some rhonchi bilaterally. Her heart is regular without gallop.  I think she's ready for discharge  Discharge home today

## 2017-02-23 NOTE — Plan of Care (Signed)
Problem: Pain Managment: Goal: General experience of comfort will improve Outcome: Not Progressing Pt states pain regimen not managing pain at this time. Will continue to assess.

## 2017-02-23 NOTE — Discharge Summary (Signed)
Physician Discharge Summary  Patient ID: [name] MRN: [id number] DOB/AGE: [birth year] 49 y.o. Primary Care Physician:Kortne All L, MD Admit date: 02/20/2017 Discharge date: 02/23/2017    Discharge Diagnoses:   Principal Problem:   Acute-on-chronic respiratory failure (HCC) Active Problems:   MAJOR DPRSV DISORDER RECURRENT EPISODE MODERATE   Low back pain   DM (diabetes mellitus) (HCC)   COPD exacerbation (HCC)   HTN (hypertension)   Chronic obstructive pulmonary disease (HCC)   Allergies as of 02/23/2017      Reactions   Penicillins Other (See Comments)   convulsions   Levaquin [levofloxacin In D5w] Itching, Swelling   Morphine Nausea Only      Medication List    TAKE these medications   acetaminophen 500 MG tablet Commonly known as:  TYLENOL Take 500 mg by mouth every 6 (six) hours as needed for fever.   albuterol (2.5 MG/3ML) 0.083% nebulizer solution Commonly known as:  PROVENTIL Take 2.5 mg by nebulization every 4 (four) hours as needed. For shortness of breath   PROAIR HFA 108 (90 Base) MCG/ACT inhaler Generic drug:  albuterol Inhale 2 puffs into the lungs every 6 (six) hours as needed. For shortness of breath   budesonide-formoterol 160-4.5 MCG/ACT inhaler Commonly known as:  SYMBICORT Inhale 2 puffs into the lungs 2 (two) times daily.   clonazePAM 0.5 MG tablet Commonly known as:  KLONOPIN Take 0.5 mg by mouth 3 (three) times daily.   lisinopril 20 MG tablet Commonly known as:  PRINIVIL,ZESTRIL Take 20 mg by mouth daily.   oxyCODONE 15 MG immediate release tablet Commonly known as:  ROXICODONE Take 15 mg by mouth every 4 (four) hours.   oxyCODONE 10 mg 12 hr tablet Commonly known as:  OXYCONTIN Take 1 tablet (10 mg total) by mouth every 12 (twelve) hours.   pantoprazole 40 MG tablet Commonly known as:  PROTONIX Take 40 mg by mouth daily.   predniSONE 10 MG (21) Tbpk tablet Commonly known as:  STERAPRED UNI-PAK 21 TAB Take by package  instructions   pregabalin 50 MG capsule Commonly known as:  LYRICA Take 50-100 mg by mouth every 8 (eight) hours. Also take two in the evening   promethazine 25 MG tablet Commonly known as:  PHENERGAN Take 25 mg by mouth every 6 (six) hours as needed for nausea.   sulfamethoxazole-trimethoprim 800-160 MG tablet Commonly known as:  BACTRIM DS,SEPTRA DS Take 1 tablet by mouth 2 (two) times daily.   SUMAtriptan 50 MG tablet Commonly known as:  IMITREX Take 50 mg by mouth daily as needed for migraine or headache.   tiotropium 18 MCG inhalation capsule Commonly known as:  SPIRIVA Place 18 mcg into inhaler and inhale daily.       Discharged Condition:Improved    Consults: None  Significant Diagnostic Studies: Dg Chest 2 View  Result Date: 02/20/2017 CLINICAL DATA:  Shortness of breath, cough, nasal congestion since yesterday EXAM: CHEST  2 VIEW COMPARISON:  09/28/2016 FINDINGS: Normal heart size, mediastinal contours, and pulmonary vascularity. Patchy infiltrates at the mid lower lungs bilaterally appear little changed from the previous exam. No definite superimposed infiltrate, pleural effusion or pneumothorax. Bones demineralized. IMPRESSION: Chronic increase in pulmonary markings in the mid to lower lungs bilaterally, greater at the anterior lung bases, favoring chronic interstitial lung disease ; these were shown to be in part tree-in-bud opacities on an interval CT chest question chronic infectious or inflammatory process as previously discussed. Electronically Signed   By: Angelyn Punt.D.  On: 02/20/2017 15:59    Lab Results: Basic Metabolic Panel:  Recent Labs  65/78/46 0617 02/22/17 0614  NA 139 134*  K 4.0 4.3  CL 91* 88*  CO2 37* 36*  GLUCOSE 180* 320*  BUN 9 11  CREATININE 0.37* 0.53  CALCIUM 9.5 9.3   Liver Function Tests: No results for input(s): AST, ALT, ALKPHOS, BILITOT, PROT, ALBUMIN in the last 72 hours.   CBC:  Recent Labs  02/22/17 0615  02/23/17 0431  WBC 21.2* 19.3*  NEUTROABS 19.1* 17.0*  HGB 12.2 11.9*  HCT 37.4 36.3  MCV 86.0 85.6  PLT 494* 462*    No results found for this or any previous visit (from the past 240 hour(s)).   Hospital Course: This is a 51 year old with a long known history of severe COPD who came to the emergency room on the 31st with increasing shortness of breath. She has chronic bilateral infiltrations on her lung x-ray which I think are related to previous history of ARDS. She was checked out for Mycobacterium avium intracellulare last visit and did not have that. She was treated with IV steroids IV antibiotics and inhaled bronchodilators and improved. By the time of discharge she was back at baseline  Discharge Exam: Blood pressure 110/64, pulse 84, temperature 98.2 F (36.8 C), temperature source Oral, resp. rate 16, height 5' (1.524 m), weight 56.3 kg (124 lb 1.9 oz), SpO2 94 %. She is awake and alert. She is wearing oxygen. Her chest shows rhonchi bilaterally heart is regular  Disposition: Home  Discharge Instructions    Diet - low sodium heart healthy    Complete by:  As directed    Increase activity slowly    Complete by:  As directed         Signed: Taquila Leys L   02/23/2017, 9:04 AM

## 2017-07-05 IMAGING — CR DG CHEST 1V PORT
1 series · 1 of 1 positions shown · non-contrast
Comparison: Portable exam 9958 hours compared to 10/15/2015

CLINICAL DATA: Code sepsis, cough, productive cough in triage,
history asthma, hypertension, diabetes mellitus, ARDS, COPD, smoker,
GERD

EXAM:
PORTABLE CHEST 1 VIEW

[ap portable]
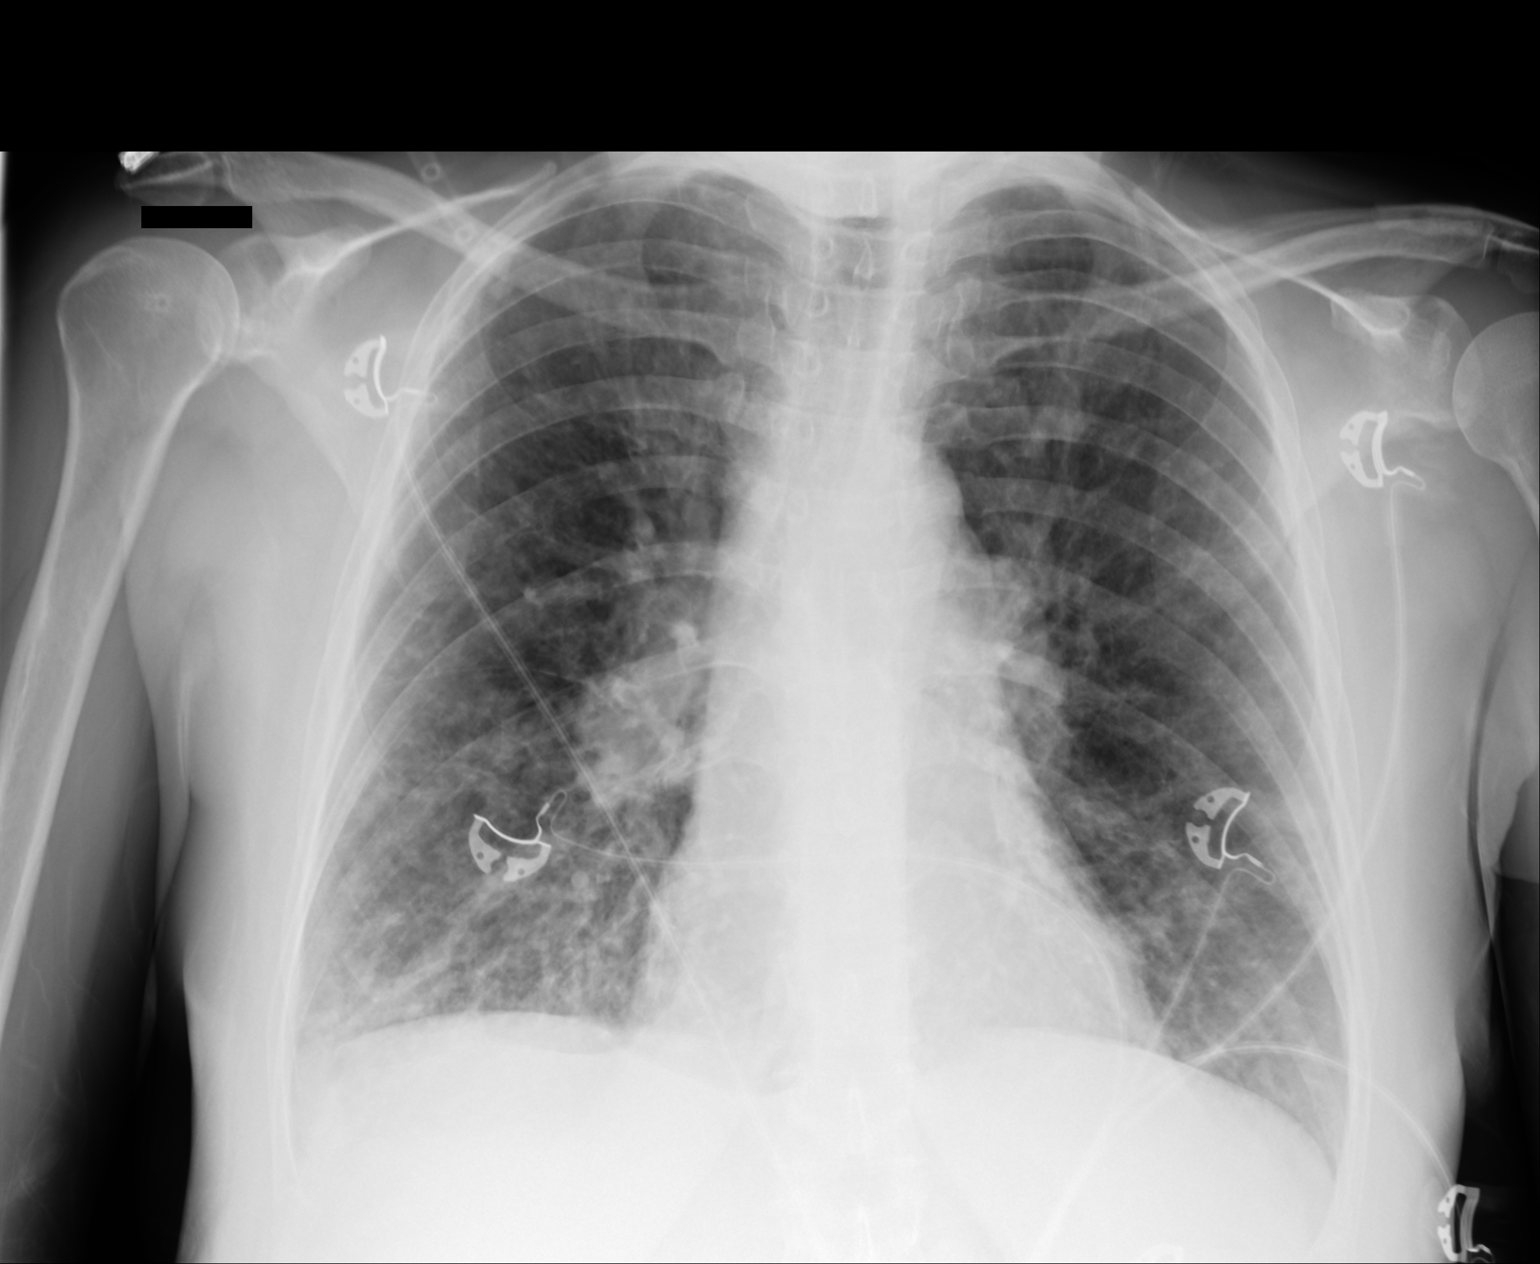

[1 of 1 positions shown; findings below may reference images not displayed]

FINDINGS: Normal heart size, mediastinal contours, and pulmonary vascularity.

Chronic versus recurrent infiltrate at RIGHT lower lobe.

Minimal chronic accentuation of LEFT basilar markings as well.

Slight prominent hila stable.

Upper lungs clear.

No pleural effusion or pneumothorax.
IMPRESSION: Persistent versus recurrent RIGHT lower lobe infiltrate.

## 2017-07-05 IMAGING — CT CT ABD-PELV W/ CM
2 of 5 series · 16 of 46 positions shown, 18 images · IV contrast (iopamidol)
Comparison: CT 02/03/2012

CLINICAL DATA: Abdominal pain for 3 days.

EXAM:
CT ABDOMEN AND PELVIS WITH CONTRAST
TECHNIQUE: Multidetector CT imaging of the abdomen and pelvis was performed
using the standard protocol following bolus administration of
intravenous contrast.
CONTRAST:  100mL [id number] IOPAMIDOL ([id number]) INJECTION 61%

[Series 2: routine abd pel with · axial · 0.64mm/px · z∈[-458,-52]mm · 13 of 93 slices shown, 15 images]
[im 6/93  soft-tissue]
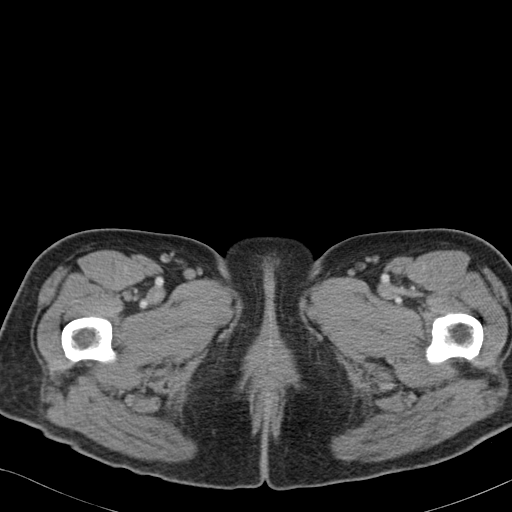
[im 6/93  bone]
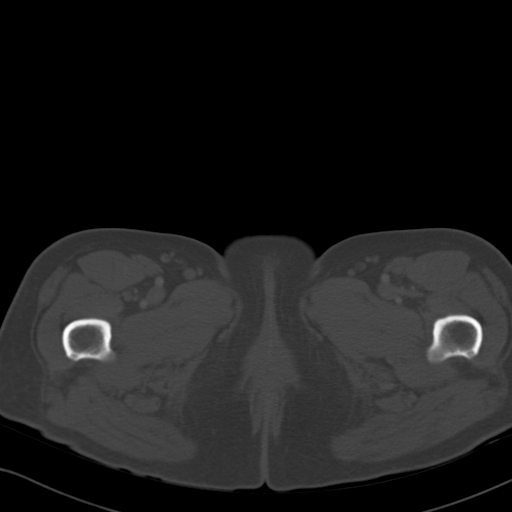
[im 11/93  soft-tissue]
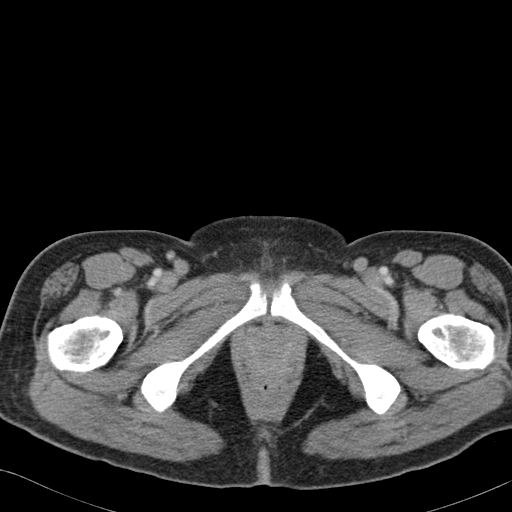
[im 22/93  soft-tissue]
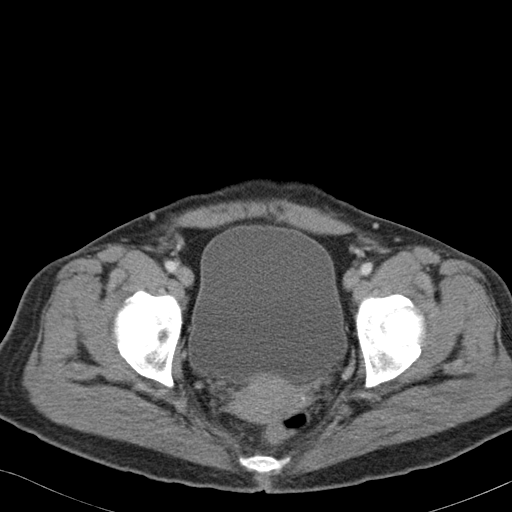
[im 28/93  soft-tissue]
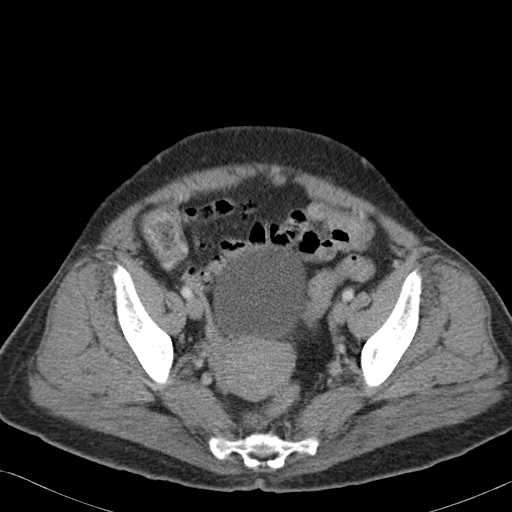
[im 33/93  soft-tissue]
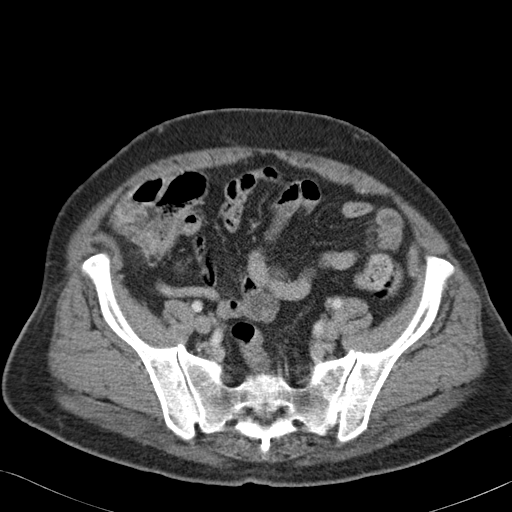
[im 38/93  soft-tissue]
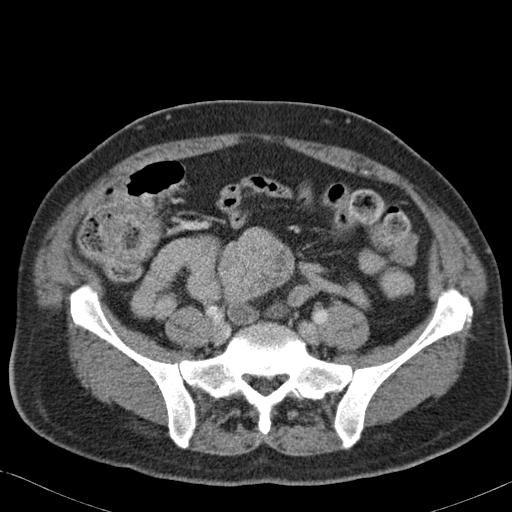
[im 49/93  soft-tissue]
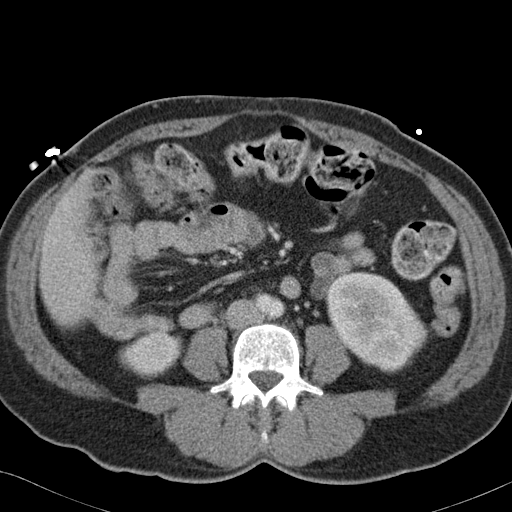
[im 55/93  soft-tissue]
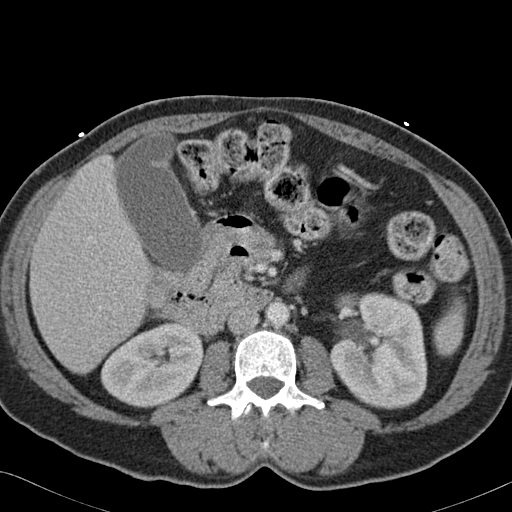
[im 60/93  soft-tissue]
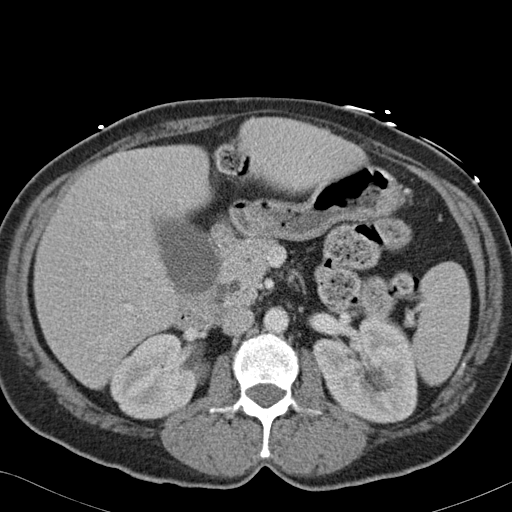
[im 60/93  bone]
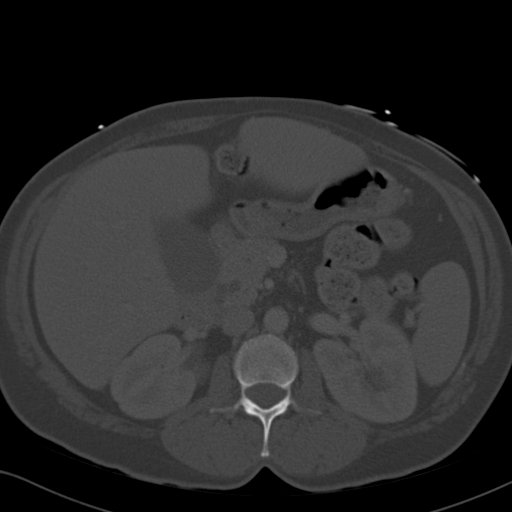
[im 65/93  soft-tissue]
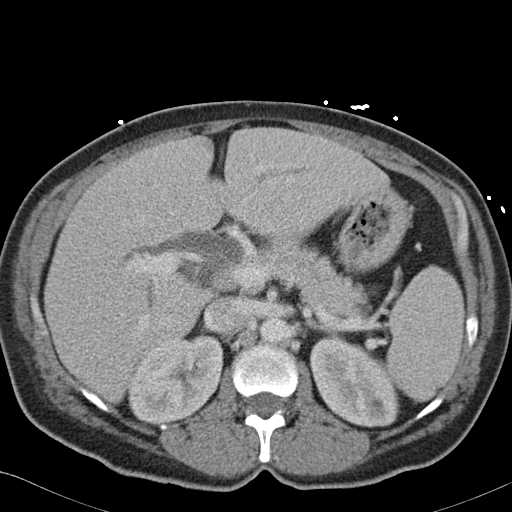
[im 71/93  soft-tissue]
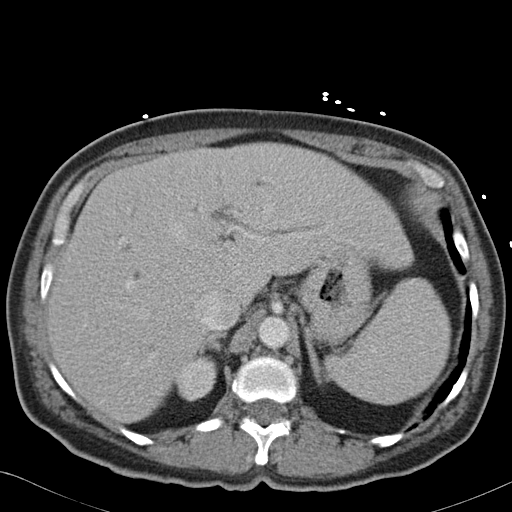
[im 82/93  soft-tissue]
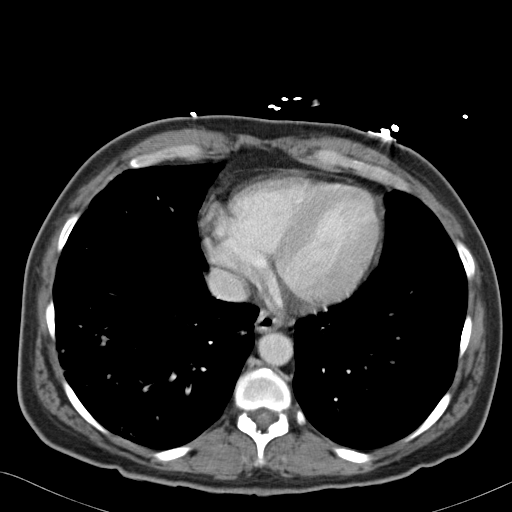
[im 87/93  soft-tissue]
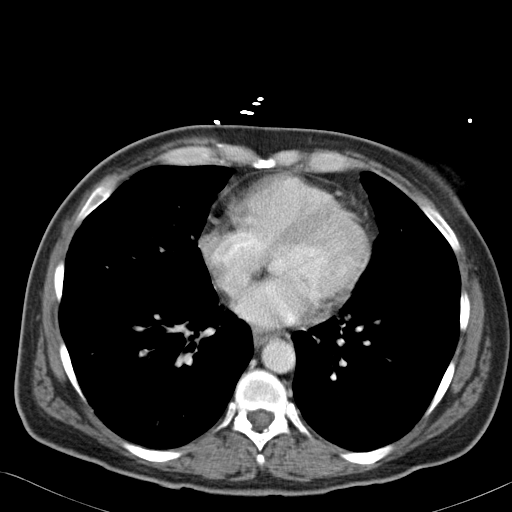

[Series 3: coronal · coronal · 0.66mm/px · 3 of 149 slices shown]
[im 50/149  soft-tissue]
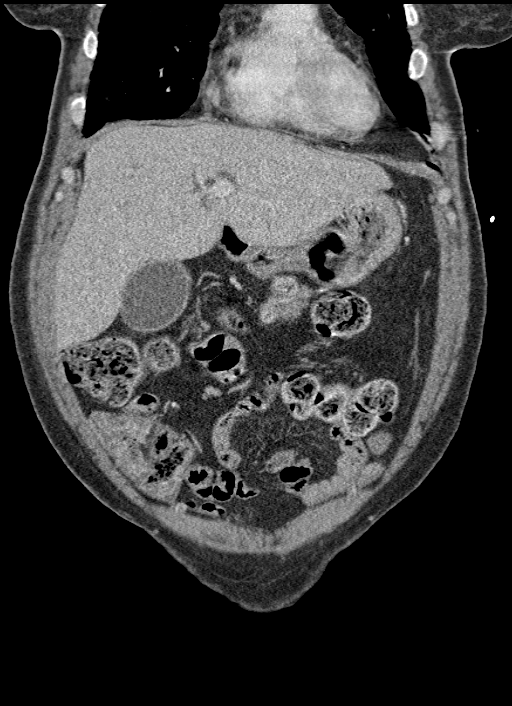
[im 66/149  soft-tissue]
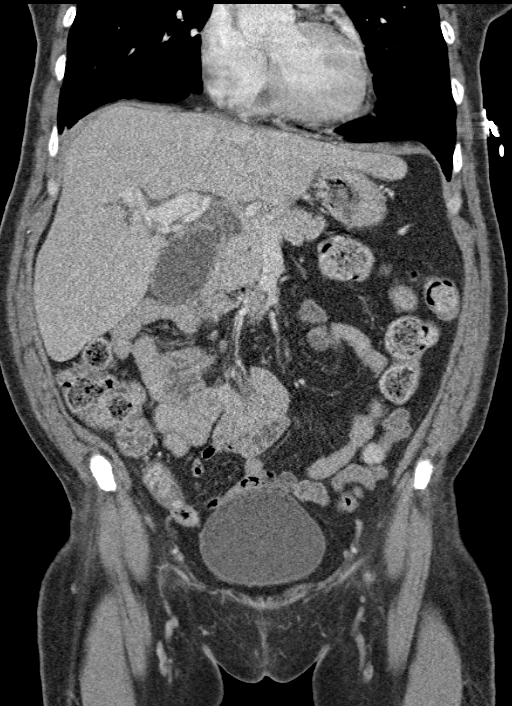
[im 83/149  soft-tissue]
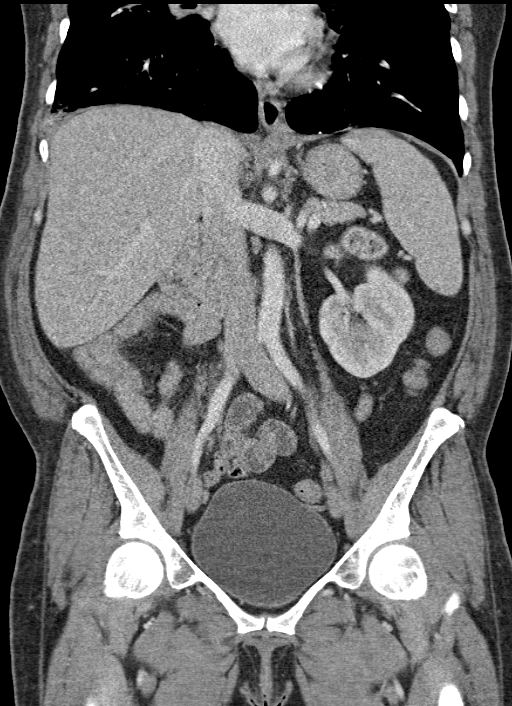

[16 of 46 positions shown; findings below may reference images not displayed]

FINDINGS: Lower chest: Branching nodular pattern in the LEFT and RIGHT lower
lobes but much more dense in the RIGHT lower lobe.

Hepatobiliary: Intrahepatic and extrahepatic duct dilatation. The
common bile duct measures 10 mm. This is new from comparison exam
6828. The gallbladder is normal diameter at 3.8 cm without evidence
of inflammation.

Pancreas: There is no pancreatic duct dilatation. No lesion in the
pancreatic head identified by CT.

Spleen: Normal spleen

Adrenals/urinary tract: Adrenal glands and kidneys are normal. The
ureters and bladder normal.

Stomach/Bowel: Stomach, small bowel, appendix, and cecum are normal.
The colon and rectosigmoid colon are normal.

Vascular/Lymphatic: Abdominal aorta is normal caliber. There is no
retroperitoneal or periportal lymphadenopathy. No pelvic
lymphadenopathy.

Reproductive: Uterus normal.

Other: No free fluid.

Musculoskeletal: No aggressive osseous lesion.
IMPRESSION: 1. Bilateral nodular airspace disease consists with pulmonary
infection. Infection most dense in the RIGHT lower lobe. Hilar
adenopathy related to the pulmonary infection. Consider atypical
infections.
2. Mild intra and extrahepatic biliary duct dilatation to the level
of the distal common bile duct. Cannot exclude a distal obstructing
lesion. No pancreatic duct dilatation. Recommend correlation with
bilirubin.

## 2017-11-03 IMAGING — DX DG CHEST 2V
2 series · 2 of 2 positions shown · non-contrast
Comparison: 05/30/2016 and 10/15/2015

CLINICAL DATA: Worsening productive cough with shortness of breath.

EXAM:
CHEST  2 VIEW

[chest pa]
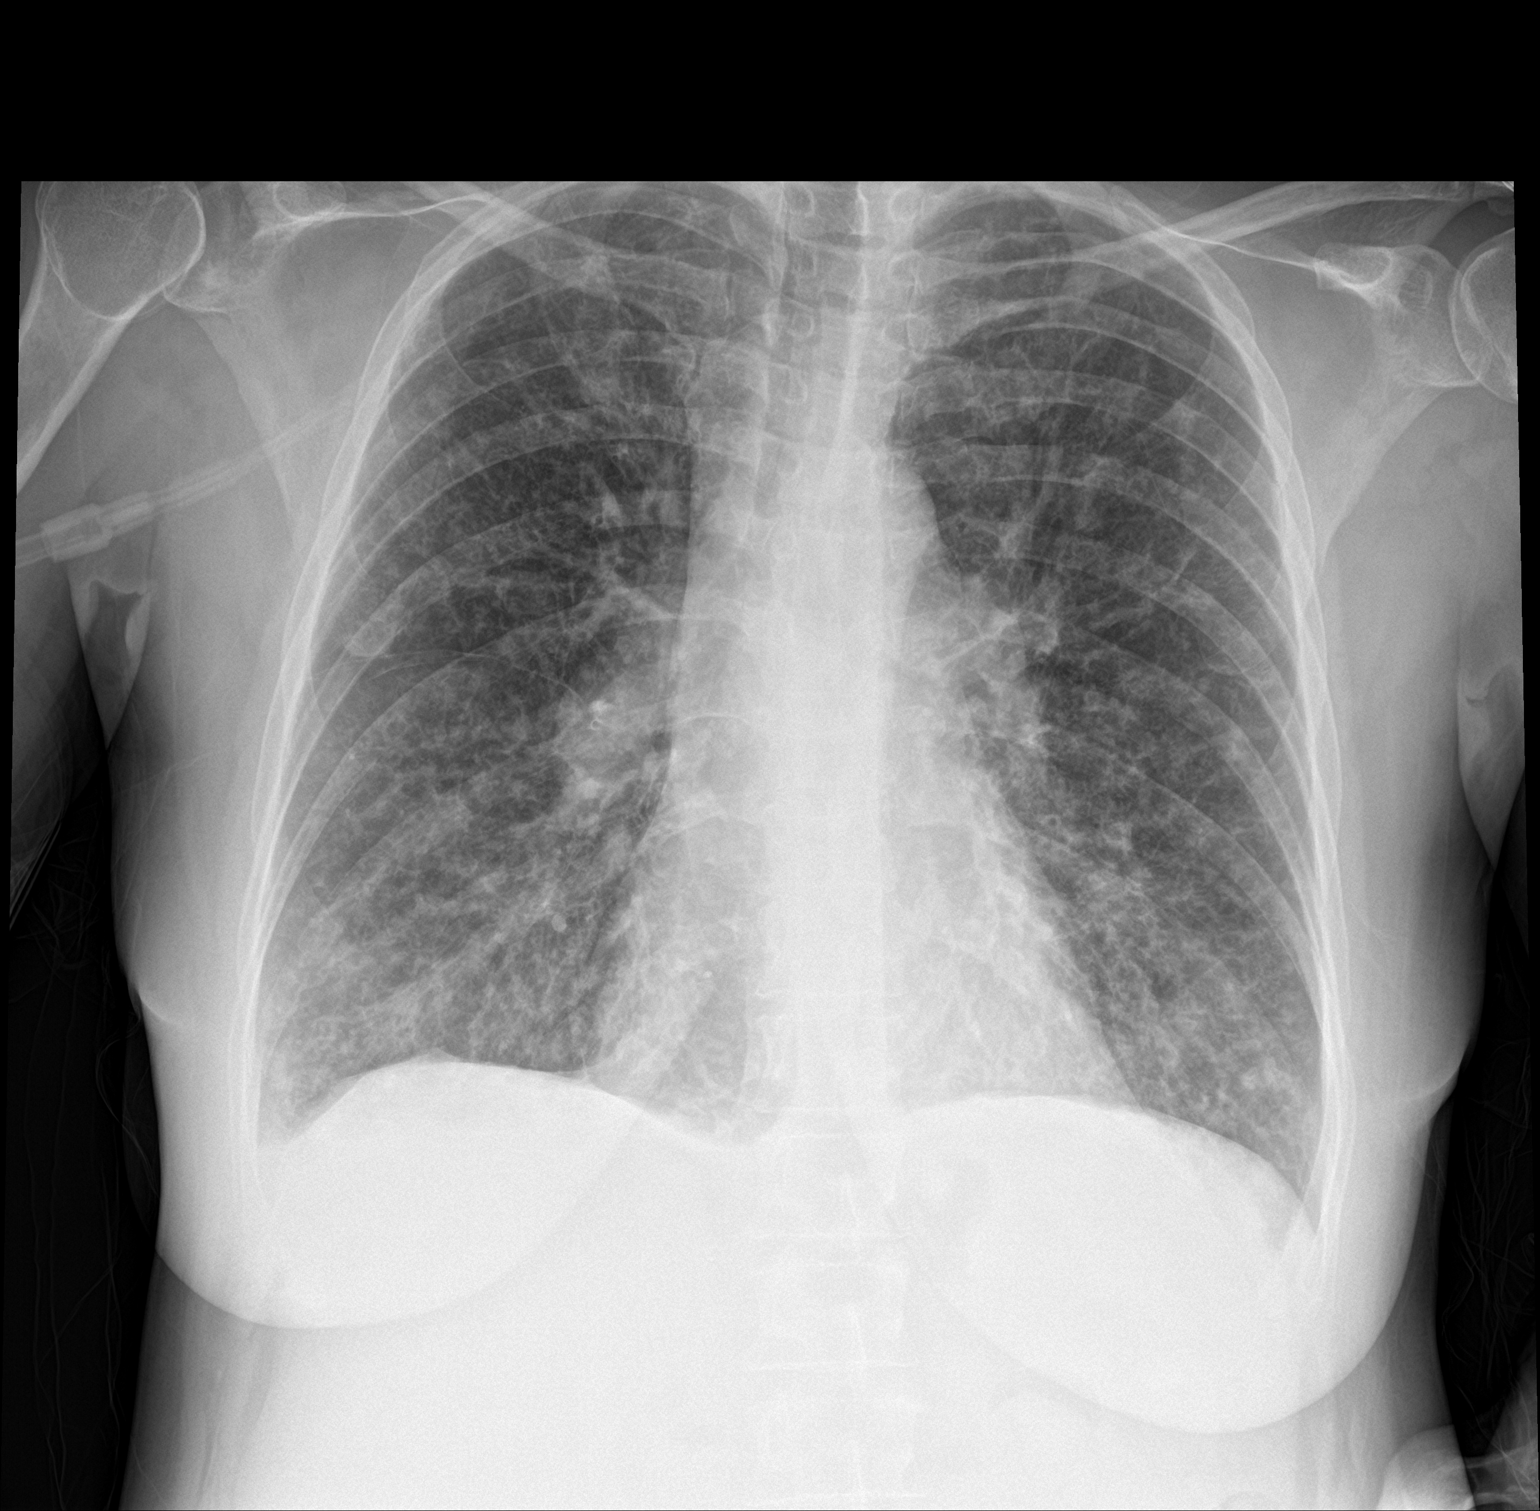

[chest lat]
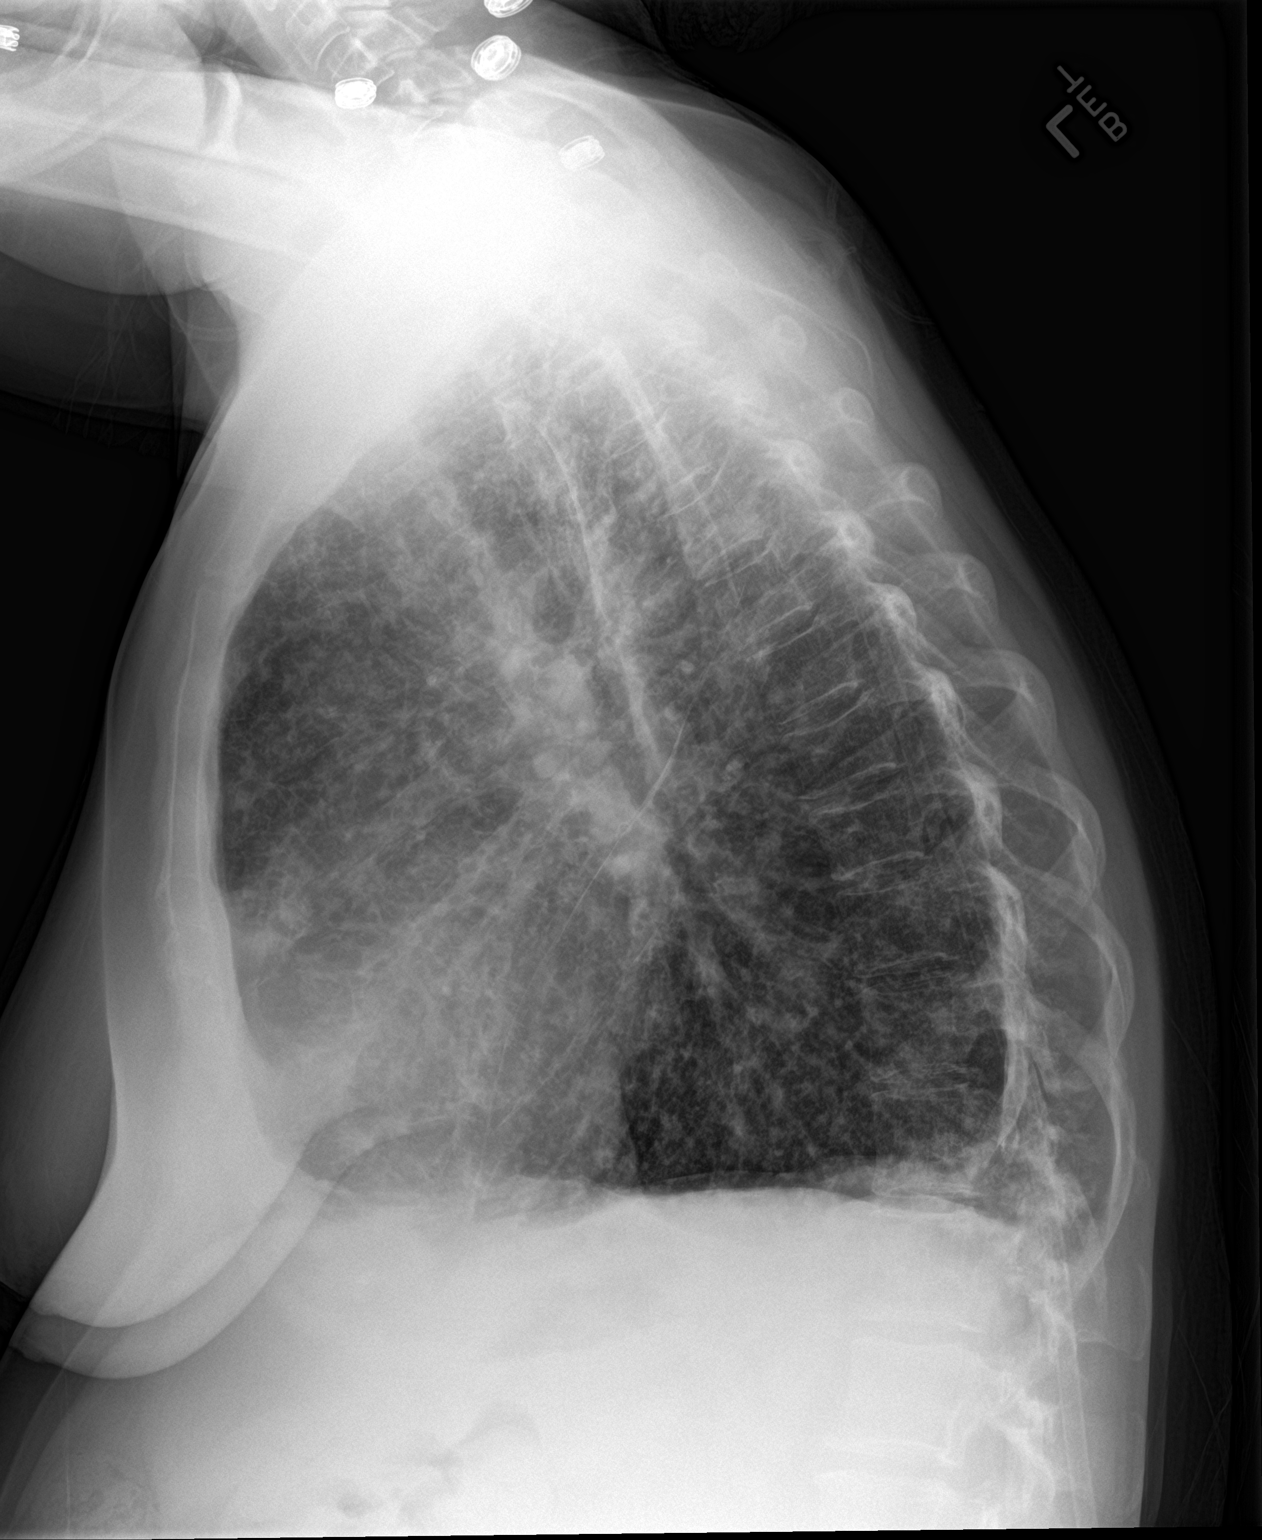

[2 of 2 positions shown; findings below may reference images not displayed]

FINDINGS: Again noted are prominent lung markings bilaterally, particularly at
the lung bases. Similar findings on prior examinations and raises
concern for chronic changes. Heart and mediastinum are within normal
limits. There is no focal airspace disease or consolidation. No
large pleural effusions. No acute bone abnormality.
IMPRESSION: Prominent lung markings, particularly in the lower lungs. Findings
are similar to prior examination. Findings raise concern for chronic
changes versus an atypical infectious process. Consider further
characterization with chest CT.

## 2017-12-17 ENCOUNTER — Other Ambulatory Visit (HOSPITAL_COMMUNITY)
Admission: RE | Admit: 2017-12-17 | Discharge: 2017-12-17 | Disposition: A | Discharge status: Home / Self Care | Payer: Medicaid Other | Source: Ambulatory Visit | Attending: Pulmonary Disease | Admitting: Pulmonary Disease

## 2017-12-17 DIAGNOSIS — Z79891 Long term (current) use of opiate analgesic: Secondary | ICD-10-CM | POA: Diagnosis present

## 2017-12-17 LAB — RAPID URINE DRUG SCREEN, HOSP PERFORMED
Amphetamines: NOT DETECTED
Barbiturates: NOT DETECTED
Benzodiazepines: NOT DETECTED
Cocaine: NOT DETECTED
OPIATES: POSITIVE — AB
TETRAHYDROCANNABINOL: POSITIVE — AB

## 2018-03-28 IMAGING — DX DG CHEST 2V
2 series · 2 of 2 positions shown · non-contrast
Comparison: 09/28/2016

CLINICAL DATA: Shortness of breath, cough, nasal congestion since
yesterday

EXAM:
CHEST  2 VIEW

[chest lat]
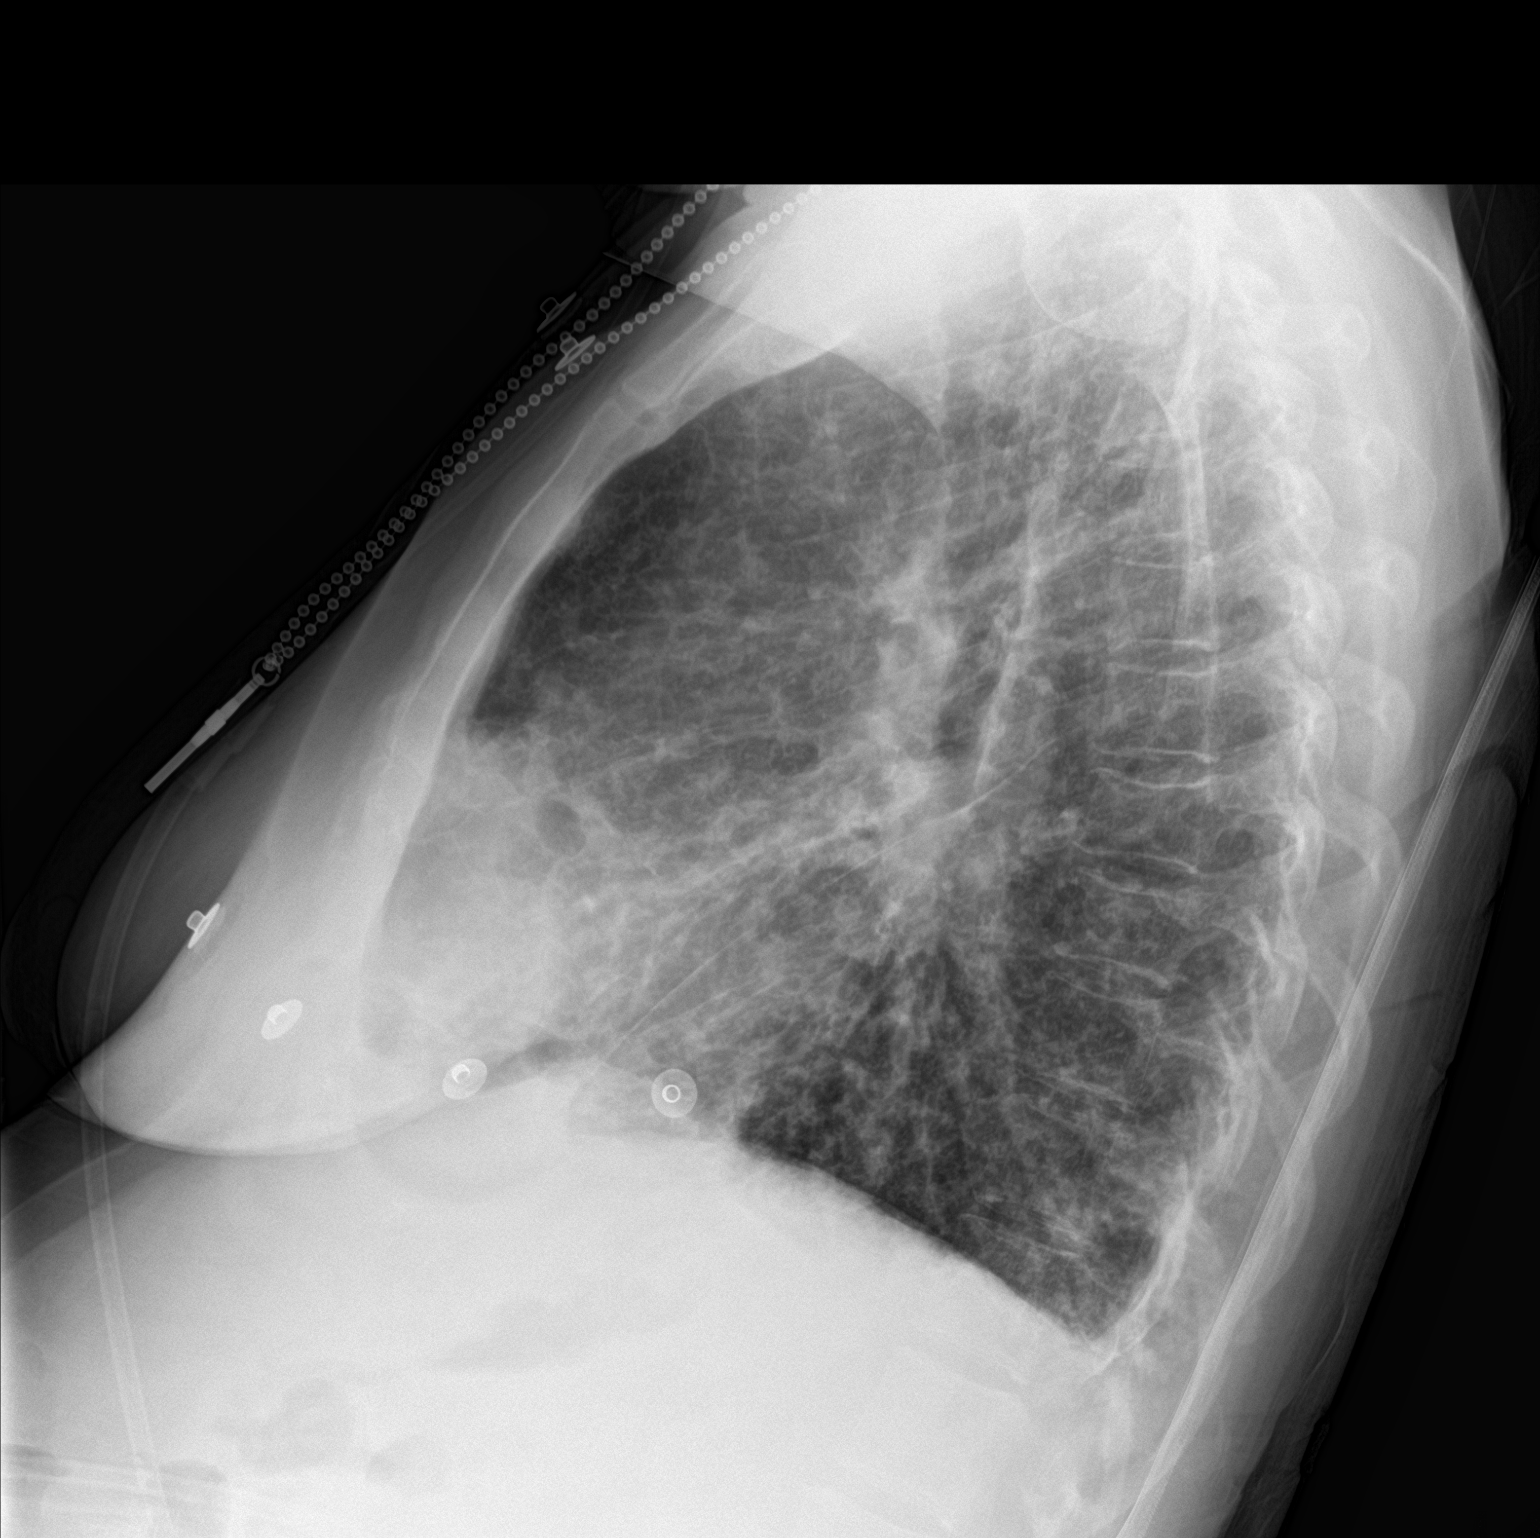

[chest ap]
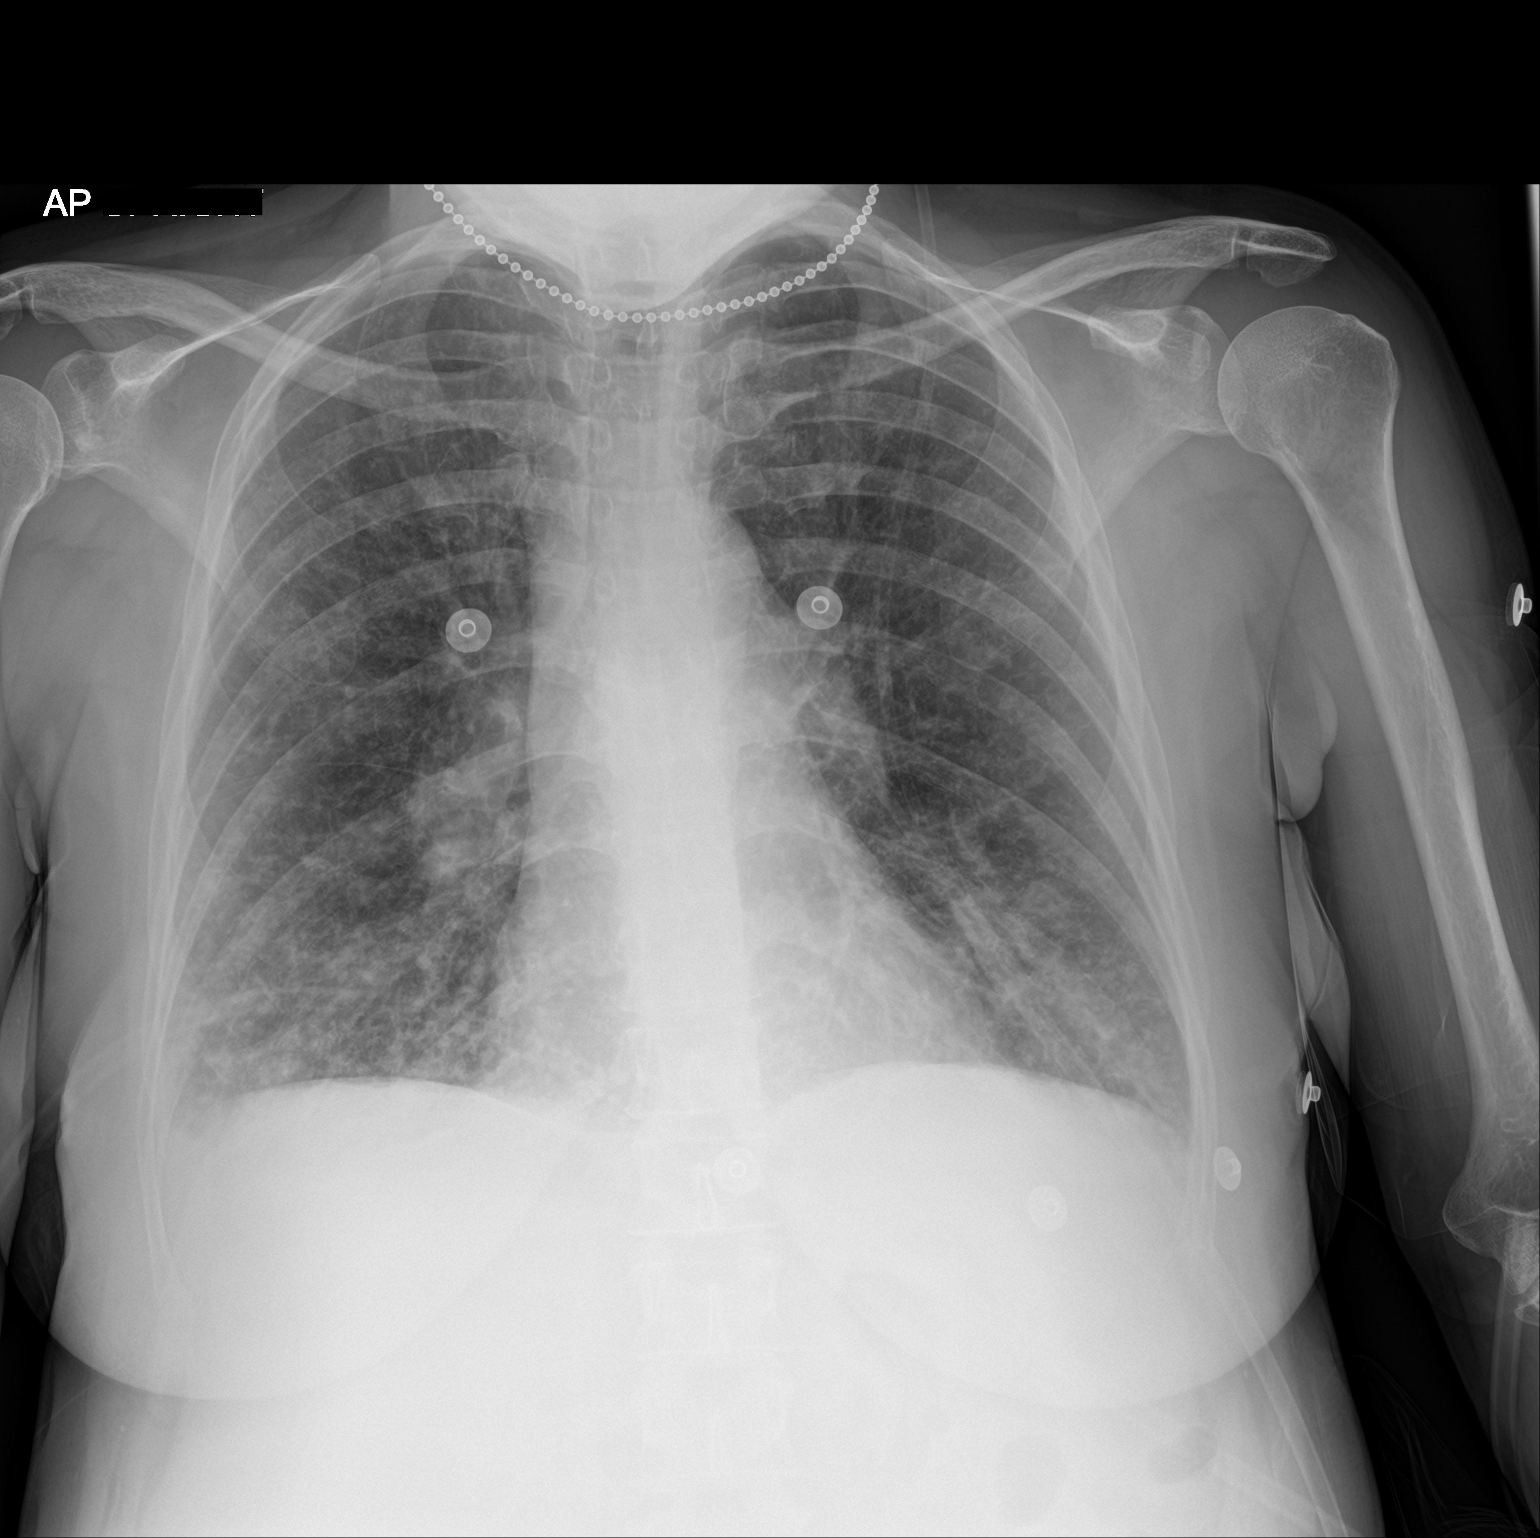

[2 of 2 positions shown; findings below may reference images not displayed]

FINDINGS: Normal heart size, mediastinal contours, and pulmonary vascularity.

Patchy infiltrates at the mid lower lungs bilaterally appear little
changed from the previous exam.

No definite superimposed infiltrate, pleural effusion or
pneumothorax.

Bones demineralized.
IMPRESSION: Chronic increase in pulmonary markings in the mid to lower lungs
bilaterally, greater at the anterior lung bases, favoring chronic
interstitial lung disease ; these were shown to be in part
tree-in-bud opacities on an interval CT chest question chronic
infectious or inflammatory process as previously discussed.

## 2018-05-10 ENCOUNTER — Encounter (HOSPITAL_COMMUNITY): Payer: Self-pay | Admitting: Emergency Medicine

## 2018-05-10 ENCOUNTER — Emergency Department (HOSPITAL_COMMUNITY)
Admission: EM | Admit: 2018-05-10 | Discharge: 2018-05-10 | Disposition: A | Discharge status: Home / Self Care | Payer: Medicaid Other | Attending: Emergency Medicine | Admitting: Emergency Medicine

## 2018-05-10 ENCOUNTER — Emergency Department (HOSPITAL_COMMUNITY): Payer: Medicaid Other

## 2018-05-10 ENCOUNTER — Other Ambulatory Visit: Payer: Self-pay

## 2018-05-10 DIAGNOSIS — R0789 Other chest pain: Secondary | ICD-10-CM | POA: Insufficient documentation

## 2018-05-10 DIAGNOSIS — J449 Chronic obstructive pulmonary disease, unspecified: Secondary | ICD-10-CM | POA: Insufficient documentation

## 2018-05-10 DIAGNOSIS — R110 Nausea: Secondary | ICD-10-CM | POA: Insufficient documentation

## 2018-05-10 DIAGNOSIS — I10 Essential (primary) hypertension: Secondary | ICD-10-CM | POA: Insufficient documentation

## 2018-05-10 DIAGNOSIS — E119 Type 2 diabetes mellitus without complications: Secondary | ICD-10-CM | POA: Insufficient documentation

## 2018-05-10 DIAGNOSIS — F17210 Nicotine dependence, cigarettes, uncomplicated: Secondary | ICD-10-CM | POA: Diagnosis not present

## 2018-05-10 DIAGNOSIS — W19XXXA Unspecified fall, initial encounter: Secondary | ICD-10-CM

## 2018-05-10 LAB — CBC WITH DIFFERENTIAL/PLATELET
BASOS PCT: 0 %
Basophils Absolute: 0 10*3/uL (ref 0.0–0.1)
Eosinophils Absolute: 0.1 10*3/uL (ref 0.0–0.7)
Eosinophils Relative: 1 %
HEMATOCRIT: 39.7 % (ref 36.0–46.0)
HEMOGLOBIN: 12 g/dL (ref 12.0–15.0)
LYMPHS ABS: 1.5 10*3/uL (ref 0.7–4.0)
Lymphocytes Relative: 14 %
MCH: 27.3 pg (ref 26.0–34.0)
MCHC: 30.2 g/dL (ref 30.0–36.0)
MCV: 90.2 fL (ref 78.0–100.0)
MONO ABS: 0.6 10*3/uL (ref 0.1–1.0)
MONOS PCT: 5 %
NEUTROS ABS: 8.5 10*3/uL — AB (ref 1.7–7.7)
NEUTROS PCT: 80 %
Platelets: 335 10*3/uL (ref 150–400)
RBC: 4.4 MIL/uL (ref 3.87–5.11)
RDW: 12.7 % (ref 11.5–15.5)
WBC: 10.6 10*3/uL — ABNORMAL HIGH (ref 4.0–10.5)

## 2018-05-10 LAB — BASIC METABOLIC PANEL
Anion gap: 8 (ref 5–15)
BUN: <5 mg/dL — ABNORMAL LOW (ref 6–20)
CALCIUM: 9.3 mg/dL (ref 8.9–10.3)
CHLORIDE: 91 mmol/L — AB (ref 101–111)
CO2: 39 mmol/L — AB (ref 22–32)
GLUCOSE: 173 mg/dL — AB (ref 65–99)
Potassium: 3.6 mmol/L (ref 3.5–5.1)
Sodium: 138 mmol/L (ref 135–145)

## 2018-05-10 LAB — I-STAT TROPONIN, ED: Troponin i, poc: 0 ng/mL (ref 0.00–0.08)

## 2018-05-10 MED ORDER — ONDANSETRON 4 MG PO TBDP: 4.0000 mg | ORAL_TABLET | Freq: Three times a day (TID) | ORAL | 0 refills | Status: DC | PRN

## 2018-05-10 MED ORDER — ONDANSETRON HCL 4 MG/2ML IJ SOLN
4.0000 mg | Freq: Once | INTRAMUSCULAR | Status: AC
  Administered 2018-05-10: 4 mg via INTRAVENOUS
  Filled 2018-05-10: qty 2

## 2018-05-10 MED ORDER — IBUPROFEN 800 MG PO TABS: 800.0000 mg | ORAL_TABLET | Freq: Three times a day (TID) | ORAL | 0 refills | Status: DC | PRN

## 2018-05-10 MED ORDER — OXYCODONE-ACETAMINOPHEN 5-325 MG PO TABS: 1.0000 | ORAL_TABLET | ORAL | 0 refills | Status: DC | PRN

## 2018-05-10 MED ORDER — FENTANYL CITRATE (PF) 100 MCG/2ML IJ SOLN
50.0000 ug | Freq: Once | INTRAMUSCULAR | Status: AC
  Administered 2018-05-10: 50 ug via INTRAVENOUS
  Filled 2018-05-10: qty 2

## 2018-05-10 NOTE — ED Provider Notes (Signed)
Emergency Department Provider Note   I have reviewed the triage vital signs and the nursing notes.   HISTORY  Chief Complaint Fall   HPI [name] is a 52 y.o. female with PMH of COPD, DM, GERD, HTN, and asthma presents to the emergency department for evaluation of right-sided chest wall pain since a fall 3 days ago.  Patient states she is trying to get into bed when she slipped on her stool and fell landing on her right side.  She denies any head injury or loss of consciousness.  She has had right, lateral chest wall pain since that time which is worse with movement or deep breathing.  She has baseline shortness of breath and requires oxygen at home which is unchanged.  No radiation of pain symptoms.  She states that she had been taking her oxycodone and OxyContin prescribed to her by her PCP but ran out of that medication and so is now presenting to the emergency department with worsening pain.  Past Medical History:  Diagnosis Date  . Angina   . Anxiety state 10/15/2015  . ARDS (adult respiratory distress syndrome) [facility])    Jan 2011  . Asthma   . Chronic back pain   . COPD (chronic obstructive pulmonary disease) (HCC)   . Diabetes mellitus   . GERD (gastroesophageal reflux disease) 12/21/2012  . HTN (hypertension) 10/15/2015  . Hyperglycemia, drug-induced    steroid induced hyperglycemia  . Migraine headache   . On home O2   . Pneumonia   . Recurrent upper respiratory infection (URI)   . Shortness of breath     Patient Active Problem List   Diagnosis Date Noted  . Atypical pneumonia 09/28/2016  . Chronic obstructive pulmonary disease (HCC)   . Pneumonia 05/30/2016  . SIRS (systemic inflammatory response syndrome) (HCC) 10/16/2015  . Sepsis (HCC) 10/15/2015  . Anxiety state 10/15/2015  . HTN (hypertension) 10/15/2015  . Hypokalemia 10/15/2015  . COPD with acute exacerbation (HCC) 08/14/2014  . Acute-on-chronic respiratory failure (HCC) 03/13/2014  . CAP  (community acquired pneumonia) 03/11/2014  . Community acquired pneumonia 03/11/2014  . COPD exacerbation (HCC) 04/22/2013  . GERD (gastroesophageal reflux disease) 12/21/2012  . Constipation 12/21/2012  . Healthcare-associated pneumonia 02/03/2012  . Candida infection 11/26/2011  . Viral syndrome 11/21/2011  . DM (diabetes mellitus) (HCC) 11/21/2011  . Thrush, oral 07/03/2011  . Low back pain 06/16/2011  . Muscle spasms of lower extremity 06/16/2011  . TOBACCO ABUSE 09/12/2010  . 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 COPD (chronic obstructive pulmonary disease) (HCC) 02/14/2010  . UNSPECIFIED NUTRITIONAL DEFICIENCY 02/13/2010  . ADULT RESPIRATORY DISTRESS SYNDROME 02/13/2010  . DELIRIUM 02/13/2010    Past Surgical History:  Procedure Laterality Date  . c-section    . TRACHEOSTOMY     decannulated 12/2009  . TUBAL LIGATION    . uterine ablasion      Allergies Penicillins; Levaquin [levofloxacin in d5w]; and Morphine  Family History  Problem Relation Age of Onset  . Coronary artery disease Brother   . Diabetes Other   . Cancer Other   . Hypertension Other     Social History Social History   Tobacco Use  . Smoking status: Current Every Day Smoker    Packs/day: 0.50    Years: 33.00    Pack years: 16.50    Types: Cigarettes  . Smokeless tobacco: Never Used  . Tobacco comment: 1/2 pack a day  since age 35  Substance Use  Topics  . Alcohol use: No  . Drug use: No    Comment: Formerly used cocaine , narcotics, THC    Review of Systems  Constitutional: No fever/chills Eyes: No visual changes. ENT: No sore throat. Cardiovascular: Positive right lateral chest pain. Respiratory: Denies shortness of breath. Gastrointestinal: No abdominal pain.  No nausea, no vomiting.  No diarrhea.  No constipation. Genitourinary: Negative for dysuria. Musculoskeletal: Negative for back pain. Skin: Negative for rash. Neurological: Negative for headaches,  focal weakness or numbness.  10-point ROS otherwise negative.  ____________________________________________   PHYSICAL EXAM:  VITAL SIGNS: ED Triage Vitals  Enc Vitals Group     BP 05/10/18 1417 (!) 141/101     Pulse Rate 05/10/18 1417 (!) 121     Resp 05/10/18 1417 20     Temp 05/10/18 1417 98.9 F (37.2 C)     Temp Source 05/10/18 1417 Oral     SpO2 05/10/18 1417 98 %     Weight 05/10/18 1417 139 lb (63 kg)     Height 05/10/18 1417 5' (1.524 m)     Pain Score 05/10/18 1416 10   Constitutional: Alert and oriented. Well appearing and in no acute distress. Eyes: Conjunctivae are normal. Head: Atraumatic. Nose: No congestion/rhinnorhea. Mouth/Throat: Mucous membranes are moist.  Oropharynx non-erythematous. Neck: No stridor.  Cardiovascular: Tachycardia. Good peripheral circulation. Grossly normal heart sounds.   Respiratory: Normal respiratory effort.  No retractions. Lungs CTAB. Gastrointestinal: Soft and nontender. No distention.  Musculoskeletal: No lower extremity tenderness nor edema. No gross deformities of extremities. Tenderness to palpation of the right lateral chest wall. No crepitus or bruising.  Neurologic:  Normal speech and language. No gross focal neurologic deficits are appreciated.  Skin:  Skin is warm, dry and intact. No rash noted.  ____________________________________________   LABS (all labs ordered are listed, but only abnormal results are displayed)  Labs Reviewed  BASIC METABOLIC PANEL - Abnormal; Notable for the following components:      Result Value   Chloride 91 (*)    CO2 39 (*)    Glucose, Bld 173 (*)    BUN <5 (*)    Creatinine, Ser <0.30 (*)    All other components within normal limits  CBC WITH DIFFERENTIAL/PLATELET - Abnormal; Notable for the following components:   WBC 10.6 (*)    Neutro Abs 8.5 (*)    All other components within normal limits  I-STAT TROPONIN, ED   ____________________________________________  EKG   EKG  Interpretation  Date/Time:  Tuesday May 10 2018 14:34:25 EDT Ventricular Rate:  121 PR Interval:    QRS Duration: 79 QT Interval:  309 QTC Calculation: 439 R Axis:   78 Text Interpretation:  Sinus tachycardia No STEMI.  Confirmed by Alona BeneLong, Anoop Hemmer [phone]) on 05/10/2018 3:32:21 PM       ____________________________________________  RADIOLOGY  Dg Chest 2 View  Result Date: 05/10/2018 CLINICAL DATA:  Chronic chest pain.  Chronic shortness of breath EXAM: CHEST - 2 VIEW COMPARISON:  February 20, 2017 FINDINGS: There is stable fibrotic type change throughout the lungs, diffuse but most notable in the basilar regions. There is no frank edema or consolidation. Heart size and pulmonary vascularity normal. No adenopathy. No evident bone lesions. IMPRESSION: Fibrotic change throughout the lungs, most notably in the lung bases. No edema or consolidation. No new opacity. Stable cardiac silhouette. Electronically Signed   By: Bretta BangWilliam  Woodruff III M.D.   On: 05/10/2018 14:57    ____________________________________________   PROCEDURES  Procedure(s) performed:   Procedures  None ____________________________________________   INITIAL IMPRESSION / ASSESSMENT AND PLAN / ED COURSE  Pertinent labs & imaging results that were available during my care of the patient were reviewed by me and considered in my medical decision making (see chart for details).  Patient presents to the emergency department for evaluation of right lateral chest wall pain after a fall 3 days ago.  She has sinus tachycardia with baseline hypoxemia.  Very low suspicion for ACS or PE.  Suspect chest wall injury.  Patient is on very high doses of pain medication at baseline.  I agreed to give her fentanyl here in addition to checking some baseline labs.   04:23 PM As reviewed with no acute findings.  I will give the patient 5 tablets of oxycodone with plan for her to call her PCP today to arrange additional pain medication as  they see appropriate Verity [place]st-term.  Also provided Zofran for nausea.  At this time, I do not feel there is any life-threatening condition present. I have reviewed and discussed all results (EKG, imaging, lab, urine as appropriate), exam findings with patient. I have reviewed nursing notes and appropriate previous records.  I feel the patient is safe to be discharged home without further emergent workup. Discussed usual and customary return precautions. Patient and family (if present) verbalize understanding and are comfortable with this plan.  Patient will follow-up with their primary care provider. If they do not have a primary care provider, information for follow-up has been provided to them. All questions have been answered.  ____________________________________________  FINAL CLINICAL IMPRESSION(S) / ED DIAGNOSES  Final diagnoses:  Fall, initial encounter  Chest wall pain  Nausea     MEDICATIONS GIVEN DURING THIS VISIT:  Medications  fentaNYL (SUBLIMAZE) injection 50 mcg (50 mcg Intravenous Given 05/10/18 1518)  ondansetron (ZOFRAN) injection 4 mg (4 mg Intravenous Given 05/10/18 1622)     NEW OUTPATIENT MEDICATIONS STARTED DURING THIS VISIT:  New Prescriptions   IBUPROFEN (ADVIL,MOTRIN) 800 MG TABLET    Take 1 tablet (800 mg total) by mouth every 8 (eight) hours as needed.   ONDANSETRON (ZOFRAN ODT) 4 MG DISINTEGRATING TABLET    Take 1 tablet (4 mg total) by mouth every 8 (eight) hours as needed for nausea or vomiting.   OXYCODONE-ACETAMINOPHEN (PERCOCET/ROXICET) 5-325 MG TABLET    Take 1 tablet by mouth every 4 (four) hours as needed for severe pain.    Note:  This document was prepared using Dragon voice recognition software and may include unintentional dictation errors.  Alona Bene, MD Emergency Medicine    Ercia Crisafulli, Arlyss Repress, MD 05/10/18 [phone]

## 2018-05-10 NOTE — Discharge Instructions (Signed)

## 2018-05-10 NOTE — ED Triage Notes (Signed)
Pt c/o RT ribcage pain that worsens with deep inspiration from a fall on Saturday. Denies worsening SOB.

## 2018-12-26 ENCOUNTER — Other Ambulatory Visit (HOSPITAL_COMMUNITY): Payer: Self-pay | Admitting: Pulmonary Disease

## 2018-12-26 ENCOUNTER — Other Ambulatory Visit: Payer: Self-pay | Admitting: Pulmonary Disease

## 2018-12-26 DIAGNOSIS — M545 Low back pain, unspecified: Secondary | ICD-10-CM

## 2018-12-28 ENCOUNTER — Telehealth: Payer: Self-pay | Admitting: *Deleted

## 2018-12-29 ENCOUNTER — Ambulatory Visit (HOSPITAL_COMMUNITY): Payer: Medicaid Other

## 2019-01-10 ENCOUNTER — Ambulatory Visit (HOSPITAL_COMMUNITY)
Admission: RE | Admit: 2019-01-10 | Discharge: 2019-01-10 | Disposition: A | Discharge status: Home / Self Care | Payer: Medicaid Other | Source: Ambulatory Visit | Attending: Pulmonary Disease | Admitting: Pulmonary Disease

## 2019-01-10 DIAGNOSIS — M545 Low back pain, unspecified: Secondary | ICD-10-CM

## 2019-01-26 ENCOUNTER — Encounter (HOSPITAL_COMMUNITY): Payer: Self-pay | Admitting: Emergency Medicine

## 2019-01-26 ENCOUNTER — Emergency Department (HOSPITAL_COMMUNITY)
Admission: EM | Admit: 2019-01-26 | Discharge: 2019-01-26 | Disposition: A | Discharge status: Home / Self Care | Payer: Medicaid Other | Attending: Emergency Medicine | Admitting: Emergency Medicine

## 2019-01-26 ENCOUNTER — Emergency Department (HOSPITAL_COMMUNITY): Payer: Medicaid Other

## 2019-01-26 ENCOUNTER — Other Ambulatory Visit: Payer: Self-pay

## 2019-01-26 DIAGNOSIS — H6691 Otitis media, unspecified, right ear: Secondary | ICD-10-CM | POA: Diagnosis not present

## 2019-01-26 DIAGNOSIS — I10 Essential (primary) hypertension: Secondary | ICD-10-CM | POA: Diagnosis not present

## 2019-01-26 DIAGNOSIS — H6690 Otitis media, unspecified, unspecified ear: Secondary | ICD-10-CM

## 2019-01-26 DIAGNOSIS — J069 Acute upper respiratory infection, unspecified: Secondary | ICD-10-CM | POA: Diagnosis not present

## 2019-01-26 DIAGNOSIS — E119 Type 2 diabetes mellitus without complications: Secondary | ICD-10-CM | POA: Diagnosis not present

## 2019-01-26 DIAGNOSIS — H9201 Otalgia, right ear: Secondary | ICD-10-CM | POA: Diagnosis present

## 2019-01-26 DIAGNOSIS — F17210 Nicotine dependence, cigarettes, uncomplicated: Secondary | ICD-10-CM | POA: Insufficient documentation

## 2019-01-26 DIAGNOSIS — Z7984 Long term (current) use of oral hypoglycemic drugs: Secondary | ICD-10-CM | POA: Diagnosis not present

## 2019-01-26 DIAGNOSIS — J449 Chronic obstructive pulmonary disease, unspecified: Secondary | ICD-10-CM | POA: Insufficient documentation

## 2019-01-26 DIAGNOSIS — Z79899 Other long term (current) drug therapy: Secondary | ICD-10-CM | POA: Insufficient documentation

## 2019-01-26 MED ORDER — PREDNISONE 10 MG PO TABS: ORAL_TABLET | ORAL | 0 refills | Status: DC

## 2019-01-26 MED ORDER — PREDNISONE 10 MG PO TABS
60.0000 mg | ORAL_TABLET | Freq: Once | ORAL | Status: AC
  Administered 2019-01-26: 13:00:00 60 mg via ORAL
  Filled 2019-01-26: qty 1

## 2019-01-26 MED ORDER — DOXYCYCLINE HYCLATE 100 MG PO CAPS: 100.0000 mg | ORAL_CAPSULE | Freq: Two times a day (BID) | ORAL | 0 refills | Status: DC

## 2019-01-26 MED ORDER — DOXYCYCLINE HYCLATE 100 MG PO TABS: 100.0000 mg | ORAL_TABLET | Freq: Once | ORAL | Status: DC

## 2019-01-26 MED ORDER — IPRATROPIUM-ALBUTEROL 0.5-2.5 (3) MG/3ML IN SOLN
3.0000 mL | Freq: Once | RESPIRATORY_TRACT | Status: AC
  Administered 2019-01-26: 3 mL via RESPIRATORY_TRACT
  Filled 2019-01-26: qty 3

## 2019-01-26 NOTE — ED Triage Notes (Signed)
Pt c/o RT ear pain and sore throat on RT side  x 4 days. Pt states that she becomes dizzy when she tries to move. Pt also reports vision changes.

## 2019-01-26 NOTE — Discharge Instructions (Addendum)
Return if any problems.

## 2019-01-26 NOTE — ED Provider Notes (Signed)
[facility] EMERGENCY DEPARTMENT Provider Note   CSN: [id number] Arrival date & time: 01/26/19  1147    History   Chief Complaint Chief Complaint  Patient presents with  . Otalgia    HPI [name] is a 53 y.o. female.     The history is provided by the patient. No language interpreter was used.  Otalgia  Location:  Right Quality:  Aching Severity:  Moderate Onset quality:  Gradual Duration:  2 days Timing:  Constant Progression:  Worsening Chronicity:  New Relieved by:  Nothing Worsened by:  Nothing Ineffective treatments:  None tried Associated symptoms: congestion and cough   Risk factors: no recent travel     Past Medical History:  Diagnosis Date  . Angina   . Anxiety state 10/15/2015  . ARDS (adult respiratory distress syndrome) [facility])    Jan 2011  . Asthma   . Chronic back pain   . COPD (chronic obstructive pulmonary disease) (HCC)   . Diabetes mellitus   . GERD (gastroesophageal reflux disease) 12/21/2012  . HTN (hypertension) 10/15/2015  . Hyperglycemia, drug-induced    steroid induced hyperglycemia  . Migraine headache   . On home O2   . Pneumonia   . Recurrent upper respiratory infection (URI)   . Shortness of breath     Patient Active Problem List   Diagnosis Date Noted  . Atypical pneumonia 09/28/2016  . Chronic obstructive pulmonary disease (HCC)   . Pneumonia 05/30/2016  . SIRS (systemic inflammatory response syndrome) (HCC) 10/16/2015  . Sepsis (HCC) 10/15/2015  . Anxiety state 10/15/2015  . HTN (hypertension) 10/15/2015  . Hypokalemia 10/15/2015  . COPD with acute exacerbation (HCC) 08/14/2014  . Acute-on-chronic respiratory failure (HCC) 03/13/2014  . CAP (community acquired pneumonia) 03/11/2014  . Community acquired pneumonia 03/11/2014  . COPD exacerbation (HCC) 04/22/2013  . GERD (gastroesophageal reflux disease) 12/21/2012  . Constipation 12/21/2012  . Healthcare-associated pneumonia 02/03/2012  . Candida infection  11/26/2011  . Viral syndrome 11/21/2011  . DM (diabetes mellitus) (HCC) 11/21/2011  . Thrush, oral 07/03/2011  . Low back pain 06/16/2011  . Muscle spasms of lower extremity 06/16/2011  . TOBACCO ABUSE 09/12/2010  . 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 COPD (chronic obstructive pulmonary disease) (HCC) 02/14/2010  . UNSPECIFIED NUTRITIONAL DEFICIENCY 02/13/2010  . ADULT RESPIRATORY DISTRESS SYNDROME 02/13/2010  . DELIRIUM 02/13/2010    Past Surgical History:  Procedure Laterality Date  . c-section    . TRACHEOSTOMY     decannulated 12/2009  . TUBAL LIGATION    . uterine ablasion       OB History   No obstetric history on file.      Home Medications    Prior to Admission medications   Medication Sig Start Date End Date Taking? Authorizing Provider  acetaminophen (TYLENOL) 500 MG tablet Take 500 mg by mouth every 6 (six) hours as needed for fever.     [provider]  albuterol (PROAIR HFA) 108 (90 BASE) MCG/ACT inhaler Inhale 2 puffs into the lungs every 6 (six) hours as needed. For shortness of breath    [provider]  albuterol (PROVENTIL) (2.5 MG/3ML) 0.083% nebulizer solution Take 2.5 mg by nebulization every 4 (four) hours as needed. For shortness of breath  03/19/11   Parrett, Virgel Bouquet, NP  budesonide-formoterol (SYMBICORT) 160-4.5 MCG/ACT inhaler Inhale 2 puffs into the lungs 2 (two) times daily.    [provider]  clonazePAM (KLONOPIN) 0.5 MG tablet Take 0.5 mg  by mouth 3 (three) times daily.     [provider]  fluticasone (FLONASE) 50 MCG/ACT nasal spray Place 2 sprays into both nostrils daily. 04/13/18   [provider]  ibuprofen (ADVIL,MOTRIN) 800 MG tablet Take 1 tablet (800 mg total) by mouth every 8 (eight) hours as needed. 05/10/18   Long, Arlyss Repress, MD  lisinopril (PRINIVIL,ZESTRIL) 20 MG tablet Take 20 mg by mouth daily.    [provider]  LYRICA 100 MG capsule Take 100 mg by mouth  2 (two) times daily. 05/02/18   [provider]  metFORMIN (GLUCOPHAGE) 500 MG tablet Take 1 tablet by mouth 2 (two) times daily. 04/26/18   [provider]  ondansetron (ZOFRAN ODT) 4 MG disintegrating tablet Take 1 tablet (4 mg total) by mouth every 8 (eight) hours as needed for nausea or vomiting. 05/10/18   Long, Arlyss Repress, MD  OxyCODONE (OXYCONTIN) 10 mg T12A Take 1 tablet (10 mg total) by mouth every 12 (twelve) hours. 04/24/13   Kari Baars, MD  oxyCODONE (ROXICODONE) 15 MG immediate release tablet Take 15 mg by mouth every 4 (four) hours.    [provider]  oxyCODONE-acetaminophen (PERCOCET/ROXICET) 5-325 MG tablet Take 1 tablet by mouth every 4 (four) hours as needed for severe pain. 05/10/18   Long, Arlyss Repress, MD  pantoprazole (PROTONIX) 40 MG tablet Take 40 mg by mouth daily.    [provider]  predniSONE (STERAPRED UNI-PAK 21 TAB) 10 MG (21) TBPK tablet Take by package instructions 02/23/17   Kari Baars, MD  promethazine (PHENERGAN) 25 MG tablet Take 25 mg by mouth every 6 (six) hours as needed for nausea.     [provider]  sulfamethoxazole-trimethoprim (BACTRIM DS,SEPTRA DS) 800-160 MG tablet Take 1 tablet by mouth 2 (two) times daily. 02/23/17   Kari Baars, MD  SUMAtriptan (IMITREX) 50 MG tablet Take 50 mg by mouth daily as needed for migraine or headache.  08/28/16   [provider]  tiotropium (SPIRIVA) 18 MCG inhalation capsule Place 18 mcg into inhaler and inhale daily.    [provider]  ARIPiprazole (ABILIFY) 5 MG tablet Take 5 mg by mouth daily.    02/03/12  [provider]  venlafaxine (EFFEXOR) 75 MG tablet Take 75 mg by mouth daily.    02/03/12  [provider]    Family History Family History  Problem Relation Age of Onset  . Coronary artery disease Brother   . Diabetes Other   . Cancer Other   . Hypertension Other     Social History Social History   Tobacco Use  . Smoking  status: Current Every Day Smoker    Packs/day: 1.00    Years: 33.00    Pack years: 33.00    Types: Cigarettes  . Smokeless tobacco: Never Used  . Tobacco comment: 1/2 pack a day  since age 64  Substance Use Topics  . Alcohol use: No  . Drug use: No    Comment: Formerly used cocaine , narcotics, THC     Allergies   Penicillins; Levaquin [levofloxacin in d5w]; and Morphine   Review of Systems Review of Systems  HENT: Positive for congestion and ear pain.   Respiratory: Positive for cough.   All other systems reviewed and are negative.    Physical Exam Updated Vital Signs BP (!) 159/90 (BP Location: Right Arm)   Pulse 87   Temp 98.5 F (36.9 C) (Oral)   Resp 18   Ht 5' (1.524  m)   Wt 63 kg   SpO2 96%   BMI 27.15 kg/m   Physical Exam Vitals signs and nursing note reviewed.  Constitutional:      Appearance: She is well-developed.  HENT:     Head: Normocephalic.     Left Ear: Tympanic membrane normal.     Nose: Nose normal.     Mouth/Throat:     Mouth: Mucous membranes are moist.  Eyes:     Pupils: Pupils are equal, round, and reactive to light.  Neck:     Musculoskeletal: Normal range of motion.  Cardiovascular:     Rate and Rhythm: Normal rate.     Pulses: Normal pulses.  Pulmonary:     Breath sounds: Wheezing and rhonchi present.  Abdominal:     General: There is no distension.  Musculoskeletal: Normal range of motion.  Skin:    General: Skin is warm.  Neurological:     Mental Status: She is alert and oriented to person, place, and time.  Psychiatric:        Mood and Affect: Mood normal.      ED Treatments / Results  Labs (all labs ordered are listed, but only abnormal results are displayed) Labs Reviewed - No data to display  EKG None  Radiology No results found.  Procedures Procedures (including critical care time)  Medications Ordered in ED Medications  ipratropium-albuterol (DUONEB) 0.5-2.5 (3) MG/3ML nebulizer solution 3 mL (has  no administration in time range)     Initial Impression / Assessment and Plan / ED Course  I have reviewed the triage vital signs and the nursing notes.  Pertinent labs & imaging results that were available during my care of the patient were reviewed by me and considered in my medical decision making (see chart for details).        An After Visit Summary was printed and given to the patient.  Final Clinical Impressions(s) / ED Diagnoses   Final diagnoses:  Acute otitis media, unspecified otitis media type  Upper respiratory tract infection, unspecified type    ED Discharge Orders         Ordered    predniSONE (DELTASONE) 10 MG tablet     01/26/19 1311    doxycycline (VIBRAMYCIN) 100 MG capsule  2 times daily     01/26/19 1311        An After Visit Summary was printed and given to the patient.    Elson Areas, [place] 01/26/19 1632    Vanetta Mulders, MD 01/28/19 [phone]

## 2019-02-09 ENCOUNTER — Other Ambulatory Visit: Payer: Self-pay

## 2019-02-09 ENCOUNTER — Encounter (HOSPITAL_COMMUNITY): Payer: Self-pay | Admitting: Emergency Medicine

## 2019-02-09 ENCOUNTER — Emergency Department (HOSPITAL_COMMUNITY): Payer: Medicaid Other

## 2019-02-09 ENCOUNTER — Emergency Department (HOSPITAL_COMMUNITY)
Admission: EM | Admit: 2019-02-09 | Discharge: 2019-02-09 | Disposition: A | Discharge status: Home / Self Care | Payer: Medicaid Other | Attending: Emergency Medicine | Admitting: Emergency Medicine

## 2019-02-09 DIAGNOSIS — R112 Nausea with vomiting, unspecified: Secondary | ICD-10-CM

## 2019-02-09 DIAGNOSIS — E119 Type 2 diabetes mellitus without complications: Secondary | ICD-10-CM | POA: Insufficient documentation

## 2019-02-09 DIAGNOSIS — F17210 Nicotine dependence, cigarettes, uncomplicated: Secondary | ICD-10-CM | POA: Diagnosis not present

## 2019-02-09 DIAGNOSIS — Z79899 Other long term (current) drug therapy: Secondary | ICD-10-CM | POA: Diagnosis not present

## 2019-02-09 DIAGNOSIS — R911 Solitary pulmonary nodule: Secondary | ICD-10-CM | POA: Insufficient documentation

## 2019-02-09 DIAGNOSIS — I10 Essential (primary) hypertension: Secondary | ICD-10-CM | POA: Insufficient documentation

## 2019-02-09 DIAGNOSIS — Z7984 Long term (current) use of oral hypoglycemic drugs: Secondary | ICD-10-CM | POA: Insufficient documentation

## 2019-02-09 DIAGNOSIS — R197 Diarrhea, unspecified: Secondary | ICD-10-CM | POA: Insufficient documentation

## 2019-02-09 DIAGNOSIS — J449 Chronic obstructive pulmonary disease, unspecified: Secondary | ICD-10-CM | POA: Diagnosis not present

## 2019-02-09 LAB — CBC WITH DIFFERENTIAL/PLATELET
Abs Immature Granulocytes: 0.03 10*3/uL (ref 0.00–0.07)
Basophils Absolute: 0 10*3/uL (ref 0.0–0.1)
Basophils Relative: 0 %
Eosinophils Absolute: 0.1 10*3/uL (ref 0.0–0.5)
Eosinophils Relative: 1 %
HCT: 41.3 % (ref 36.0–46.0)
Hemoglobin: 13.2 g/dL (ref 12.0–15.0)
Immature Granulocytes: 0 %
Lymphocytes Relative: 17 %
Lymphs Abs: 2 10*3/uL (ref 0.7–4.0)
MCH: 28.2 pg (ref 26.0–34.0)
MCHC: 32 g/dL (ref 30.0–36.0)
MCV: 88.2 fL (ref 80.0–100.0)
MONO ABS: 0.5 10*3/uL (ref 0.1–1.0)
Monocytes Relative: 5 %
NEUTROS ABS: 9.3 10*3/uL — AB (ref 1.7–7.7)
Neutrophils Relative %: 77 %
Platelets: 286 10*3/uL (ref 150–400)
RBC: 4.68 MIL/uL (ref 3.87–5.11)
RDW: 12.9 % (ref 11.5–15.5)
WBC: 11.9 10*3/uL — ABNORMAL HIGH (ref 4.0–10.5)
nRBC: 0 % (ref 0.0–0.2)

## 2019-02-09 LAB — COMPREHENSIVE METABOLIC PANEL
ALT: 13 U/L (ref 0–44)
AST: 13 U/L — ABNORMAL LOW (ref 15–41)
Albumin: 3.9 g/dL (ref 3.5–5.0)
Alkaline Phosphatase: 75 U/L (ref 38–126)
Anion gap: 8 (ref 5–15)
BUN: 7 mg/dL (ref 6–20)
CO2: 34 mmol/L — ABNORMAL HIGH (ref 22–32)
Calcium: 8.9 mg/dL (ref 8.9–10.3)
Chloride: 97 mmol/L — ABNORMAL LOW (ref 98–111)
Creatinine, Ser: 0.35 mg/dL — ABNORMAL LOW (ref 0.44–1.00)
GFR calc Af Amer: >60 mL/min (ref >60)
Glucose, Bld: 286 mg/dL — ABNORMAL HIGH (ref 70–99)
Potassium: 4.3 mmol/L (ref 3.5–5.1)
Sodium: 139 mmol/L (ref 135–145)
Total Bilirubin: 0.4 mg/dL (ref 0.3–1.2)
Total Protein: 7 g/dL (ref 6.5–8.1)

## 2019-02-09 LAB — LIPASE, BLOOD: LIPASE: 45 U/L (ref 11–51)

## 2019-02-09 MED ORDER — SODIUM CHLORIDE 0.9 % IV BOLUS
1000.0000 mL | Freq: Once | INTRAVENOUS | Status: AC
  Administered 2019-02-09: 1000 mL via INTRAVENOUS

## 2019-02-09 MED ORDER — HYDROCODONE-ACETAMINOPHEN 5-325 MG PO TABS: 1.0000 | ORAL_TABLET | Freq: Four times a day (QID) | ORAL | 0 refills | Status: DC | PRN

## 2019-02-09 MED ORDER — IPRATROPIUM-ALBUTEROL 0.5-2.5 (3) MG/3ML IN SOLN
3.0000 mL | Freq: Once | RESPIRATORY_TRACT | Status: AC
  Administered 2019-02-09: 3 mL via RESPIRATORY_TRACT
  Filled 2019-02-09: qty 3

## 2019-02-09 MED ORDER — ONDANSETRON 4 MG PO TBDP: 4.0000 mg | ORAL_TABLET | Freq: Three times a day (TID) | ORAL | 0 refills | Status: DC | PRN

## 2019-02-09 MED ORDER — HYDROMORPHONE HCL 1 MG/ML IJ SOLN
1.0000 mg | Freq: Once | INTRAMUSCULAR | Status: AC
  Administered 2019-02-09: 1 mg via INTRAVENOUS
  Filled 2019-02-09: qty 1

## 2019-02-09 NOTE — ED Triage Notes (Signed)
500 bolus given by EMS

## 2019-02-09 NOTE — Discharge Instructions (Signed)
You were seen in the emergency department for nausea vomiting diarrhea.  Your symptoms improved with some IV fluids and pain medications.  You had a chest x-ray that showed a pulmonary nodule and this will need an outpatient CAT scan.  We are providing you with a prescription for nausea medicine and pain medicine until you can see your doctor.  Please return if any concerns.

## 2019-02-09 NOTE — ED Notes (Signed)
States she is feeling better, no abdominal pain, friend went home to get  02 for transport home.

## 2019-02-09 NOTE — ED Provider Notes (Signed)
[facility] EMERGENCY DEPARTMENT Provider Note   CSN: [id number] Arrival date & time: 02/09/19  1624    History   Chief Complaint Chief Complaint  Patient presents with  . Diarrhea    HPI [name] is a 53 y.o. female.  She is presenting by EMS for evaluation of diarrhea abdominal pain vomiting nausea headache that is been going on since 4 days ago.  It seems to be related to her running out of her chronic narcotics about that time.  She is not due to get another prescription for another week, said she needed to use more pills earlier in the month and ran out.  She has chronic back pain.  She has COPD on home O2 and continues to smoke.  She was here a week or so ago for a flare and was on steroids at that time.  No recent travel.  She said her daughter was sick with a GI bug last week.     The history is provided by the patient.  Diarrhea  Quality:  Watery Severity:  Moderate Onset quality:  Gradual Timing:  Intermittent Progression:  Unchanged Relieved by:  None tried Worsened by:  Nothing Ineffective treatments:  None tried Associated symptoms: abdominal pain, cough, headaches and vomiting   Associated symptoms: no diaphoresis, no fever, no myalgias and no URI   Risk factors: recent antibiotic use and sick contacts   Risk factors: no travel to endemic areas     Past Medical History:  Diagnosis Date  . Angina   . Anxiety state 10/15/2015  . ARDS (adult respiratory distress syndrome) [facility])    Jan 2011  . Asthma   . Chronic back pain   . COPD (chronic obstructive pulmonary disease) (HCC)   . Diabetes mellitus   . GERD (gastroesophageal reflux disease) 12/21/2012  . HTN (hypertension) 10/15/2015  . Hyperglycemia, drug-induced    steroid induced hyperglycemia  . Migraine headache   . On home O2   . Pneumonia   . Recurrent upper respiratory infection (URI)   . Shortness of breath     Patient Active Problem List   Diagnosis Date Noted  . Atypical pneumonia  09/28/2016  . Chronic obstructive pulmonary disease (HCC)   . Pneumonia 05/30/2016  . SIRS (systemic inflammatory response syndrome) (HCC) 10/16/2015  . Sepsis (HCC) 10/15/2015  . Anxiety state 10/15/2015  . HTN (hypertension) 10/15/2015  . Hypokalemia 10/15/2015  . COPD with acute exacerbation (HCC) 08/14/2014  . Acute-on-chronic respiratory failure (HCC) 03/13/2014  . CAP (community acquired pneumonia) 03/11/2014  . Community acquired pneumonia 03/11/2014  . COPD exacerbation (HCC) 04/22/2013  . GERD (gastroesophageal reflux disease) 12/21/2012  . Constipation 12/21/2012  . Healthcare-associated pneumonia 02/03/2012  . Candida infection 11/26/2011  . Viral syndrome 11/21/2011  . DM (diabetes mellitus) (HCC) 11/21/2011  . Thrush, oral 07/03/2011  . Low back pain 06/16/2011  . Muscle spasms of lower extremity 06/16/2011  . TOBACCO ABUSE 09/12/2010  . 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 COPD (chronic obstructive pulmonary disease) (HCC) 02/14/2010  . UNSPECIFIED NUTRITIONAL DEFICIENCY 02/13/2010  . ADULT RESPIRATORY DISTRESS SYNDROME 02/13/2010  . DELIRIUM 02/13/2010    Past Surgical History:  Procedure Laterality Date  . c-section    . TRACHEOSTOMY     decannulated 12/2009  . TUBAL LIGATION    . uterine ablasion       OB History   No obstetric history on file.      Home Medications  Prior to Admission medications   Medication Sig Start Date End Date Taking? Authorizing Provider  acetaminophen (TYLENOL) 500 MG tablet Take 500 mg by mouth every 6 (six) hours as needed for fever.     [provider]  albuterol (PROAIR HFA) 108 (90 BASE) MCG/ACT inhaler Inhale 2 puffs into the lungs every 6 (six) hours as needed. For shortness of breath    [provider]  albuterol (PROVENTIL) (2.5 MG/3ML) 0.083% nebulizer solution Take 2.5 mg by nebulization every 4 (four) hours as needed. For shortness of breath  03/19/11   Parrett, Virgel Bouquet, NP  budesonide-formoterol (SYMBICORT) 160-4.5 MCG/ACT inhaler Inhale 2 puffs into the lungs 2 (two) times daily.    [provider]  clonazePAM (KLONOPIN) 0.5 MG tablet Take 0.5 mg by mouth 3 (three) times daily.     [provider]  doxycycline (VIBRAMYCIN) 100 MG capsule Take 1 capsule (100 mg total) by mouth 2 (two) times daily. 01/26/19   Elson Areas, PA-C  fluticasone (FLONASE) 50 MCG/ACT nasal spray Place 2 sprays into both nostrils daily. 04/13/18   [provider]  ibuprofen (ADVIL,MOTRIN) 800 MG tablet Take 1 tablet (800 mg total) by mouth every 8 (eight) hours as needed. 05/10/18   Long, Arlyss Repress, MD  lisinopril (PRINIVIL,ZESTRIL) 20 MG tablet Take 20 mg by mouth daily.    [provider]  LYRICA 100 MG capsule Take 100 mg by mouth 2 (two) times daily. 05/02/18   [provider]  metFORMIN (GLUCOPHAGE) 500 MG tablet Take 1 tablet by mouth 2 (two) times daily. 04/26/18   [provider]  ondansetron (ZOFRAN ODT) 4 MG disintegrating tablet Take 1 tablet (4 mg total) by mouth every 8 (eight) hours as needed for nausea or vomiting. 05/10/18   Long, Arlyss Repress, MD  OxyCODONE (OXYCONTIN) 10 mg T12A Take 1 tablet (10 mg total) by mouth every 12 (twelve) hours. 04/24/13   Kari Baars, MD  oxyCODONE (ROXICODONE) 15 MG immediate release tablet Take 15 mg by mouth every 4 (four) hours.    [provider]  oxyCODONE-acetaminophen (PERCOCET/ROXICET) 5-325 MG tablet Take 1 tablet by mouth every 4 (four) hours as needed for severe pain. 05/10/18   Long, Arlyss Repress, MD  pantoprazole (PROTONIX) 40 MG tablet Take 40 mg by mouth daily.    [provider]  predniSONE (DELTASONE) 10 MG tablet 6,5,4,3,2,1 taper 01/26/19   Elson Areas, PA-C  promethazine (PHENERGAN) 25 MG tablet Take 25 mg by mouth every 6 (six) hours as needed for nausea.     [provider]  sulfamethoxazole-trimethoprim (BACTRIM DS,SEPTRA DS) 800-160 MG tablet Take  1 tablet by mouth 2 (two) times daily. 02/23/17   Kari Baars, MD  SUMAtriptan (IMITREX) 50 MG tablet Take 50 mg by mouth daily as needed for migraine or headache.  08/28/16   [provider]  tiotropium (SPIRIVA) 18 MCG inhalation capsule Place 18 mcg into inhaler and inhale daily.    [provider]  ARIPiprazole (ABILIFY) 5 MG tablet Take 5 mg by mouth daily.    02/03/12  [provider]  venlafaxine (EFFEXOR) 75 MG tablet Take 75 mg by mouth daily.    02/03/12  [provider]    Family History Family History  Problem Relation Age of Onset  . Coronary artery disease Brother   . Diabetes Other   . Cancer Other   . Hypertension Other     Social History Social History   Tobacco  Use  . Smoking status: Current Every Day Smoker    Packs/day: 1.00    Years: 33.00    Pack years: 33.00    Types: Cigarettes  . Smokeless tobacco: Never Used  . Tobacco comment: 1/2 pack a day  since age 17  Substance Use Topics  . Alcohol use: No  . Drug use: No    Comment: Formerly used cocaine , narcotics, THC     Allergies   Penicillins; Levaquin [levofloxacin in d5w]; and Morphine   Review of Systems Review of Systems  Constitutional: Negative for diaphoresis and fever.  HENT: Negative for sore throat.   Eyes: Negative for visual disturbance.  Respiratory: Negative for shortness of breath.   Cardiovascular: Negative for chest pain.  Gastrointestinal: Positive for abdominal pain, diarrhea and vomiting.  Genitourinary: Negative for dysuria.  Musculoskeletal: Positive for back pain. Negative for myalgias.  Skin: Negative for rash.  Neurological: Positive for headaches.     Physical Exam Updated Vital Signs BP (!) 144/95 (BP Location: Left Arm)   Pulse (!) 107   Temp 99.7 F (37.6 C) (Oral)   Resp 16   Ht 5' (1.524 m)   Wt 63 kg   SpO2 97%   BMI 27.15 kg/m   Physical Exam Vitals signs and nursing note reviewed.  Constitutional:       General: She is not in acute distress.    Appearance: She is well-developed.  HENT:     Head: Normocephalic and atraumatic.  Eyes:     Conjunctiva/sclera: Conjunctivae normal.  Neck:     Musculoskeletal: Neck supple.  Cardiovascular:     Rate and Rhythm: Regular rhythm. Tachycardia present.     Heart sounds: No murmur.  Pulmonary:     Effort: Pulmonary effort is normal. No respiratory distress.     Breath sounds: No stridor. Wheezing and rhonchi present.  Abdominal:     Palpations: Abdomen is soft.     Tenderness: There is abdominal tenderness (diffuse). There is no guarding or rebound.  Musculoskeletal:        General: No signs of injury.     Right lower leg: No edema.     Left lower leg: No edema.  Skin:    General: Skin is warm and dry.     Capillary Refill: Capillary refill takes less than 2 seconds.  Neurological:     General: No focal deficit present.     Mental Status: She is alert and oriented to person, place, and time.      ED Treatments / Results  Labs (all labs ordered are listed, but only abnormal results are displayed) Labs Reviewed  COMPREHENSIVE METABOLIC PANEL - Abnormal; Notable for the following components:      Result Value   Chloride 97 (*)    CO2 34 (*)    Glucose, Bld 286 (*)    Creatinine, Ser 0.35 (*)    AST 13 (*)    All other components within normal limits  CBC WITH DIFFERENTIAL/PLATELET - Abnormal; Notable for the following components:   WBC 11.9 (*)    Neutro Abs 9.3 (*)    All other components within normal limits  LIPASE, BLOOD    EKG None  Radiology Dg Chest 2 View  Result Date: 02/09/2019 CLINICAL DATA:  Shortness of breath for 1 week, productive cough, diarrhea for 4 days, vomiting today, history asthma, COPD, aorta yes, diabetes mellitus, hypertension EXAM: CHEST - 2 VIEW COMPARISON:  01/26/2019 FINDINGS: Normal heart size, mediastinal  contours, and pulmonary vascularity. Questionable vague nodular density versus summation  artifact mid RIGHT chest 10 mm diameter. Lungs otherwise clear. No pulmonary infiltrate, pleural effusion or pneumothorax. Bones unremarkable. IMPRESSION: Questionable 10 mm nodular density RIGHT upper lobe; CT chest recommended to exclude pulmonary nodule. Electronically Signed   By: Ulyses Southward M.D.   On: 02/09/2019 17:31    Procedures Procedures (including critical care time)  Medications Ordered in ED Medications  HYDROmorphone (DILAUDID) injection 1 mg (has no administration in time range)  sodium chloride 0.9 % bolus 1,000 mL (has no administration in time range)  ipratropium-albuterol (DUONEB) 0.5-2.5 (3) MG/3ML nebulizer solution 3 mL (has no administration in time range)     Initial Impression / Assessment and Plan / ED Course  I have reviewed the triage vital signs and the nursing notes.  Pertinent labs & imaging results that were available during my care of the patient were reviewed by me and considered in my medical decision making (see chart for details).  Clinical Course as of Feb 09 1342  Thu Feb 09, 2019  7943 53 year old female history of COPD continues to smoke here with nausea vomiting diarrhea in the setting of having run out of her narcotic pain medicine.  She has diffuse wheezing and rhonchi on exam although appears in no distress.  Abdomen is diffusely tender.  We will give her some fluids and pain medicine breathing treatment.   [MB]  1736 Abdominal pain nausea patient has a 10 mm nodule right upper lobe and radiology recommends a CT chest to exclude a pulmonary nodule.   [MB]  1900 Patient feeling better after the pain medicine and fluids.  Lab work fairly unremarkable with a slight elevation in her WBCs and an elevation in glucose.  She is asking if I can provide her with a prescription for some pain medicine for a couple of days until she can get her regular narcotics.   [MB]    Clinical Course User Index [MB] Terrilee Files, MD       Final Clinical  Impressions(s) / ED Diagnoses   Final diagnoses:  Nausea vomiting and diarrhea  Chronic obstructive pulmonary disease, unspecified COPD type [facility])  Pulmonary nodule    ED Discharge Orders         Ordered    HYDROcodone-acetaminophen (NORCO/VICODIN) 5-325 MG tablet  Every 6 hours PRN     02/09/19 1912    ondansetron (ZOFRAN ODT) 4 MG disintegrating tablet  Every 8 hours PRN     02/09/19 1912           Terrilee Files, MD 02/10/19 1343

## 2019-02-09 NOTE — ED Triage Notes (Signed)
Brought in by EMS, onset 4 days ago diarrhea, today vomiting,  CBG 318,  158/96, complaints of being out of pain medication, back pain, headache.

## 2019-02-17 ENCOUNTER — Other Ambulatory Visit (HOSPITAL_COMMUNITY): Payer: Self-pay | Admitting: Pulmonary Disease

## 2019-02-17 DIAGNOSIS — R911 Solitary pulmonary nodule: Secondary | ICD-10-CM

## 2019-03-15 ENCOUNTER — Ambulatory Visit (HOSPITAL_COMMUNITY): Payer: Medicaid Other

## 2019-03-15 ENCOUNTER — Encounter (HOSPITAL_COMMUNITY): Payer: Self-pay

## 2019-03-15 ENCOUNTER — Ambulatory Visit (HOSPITAL_BASED_OUTPATIENT_CLINIC_OR_DEPARTMENT_OTHER): Payer: Medicaid Other

## 2019-03-20 ENCOUNTER — Ambulatory Visit (HOSPITAL_BASED_OUTPATIENT_CLINIC_OR_DEPARTMENT_OTHER): Payer: Medicaid Other

## 2019-06-15 IMAGING — DX DG CHEST 2V
2 series · 2 of 2 positions shown · non-contrast
Comparison: February 20, 2017

CLINICAL DATA: Chronic chest pain.  Chronic shortness of breath

EXAM:
CHEST - 2 VIEW

[chest pa]
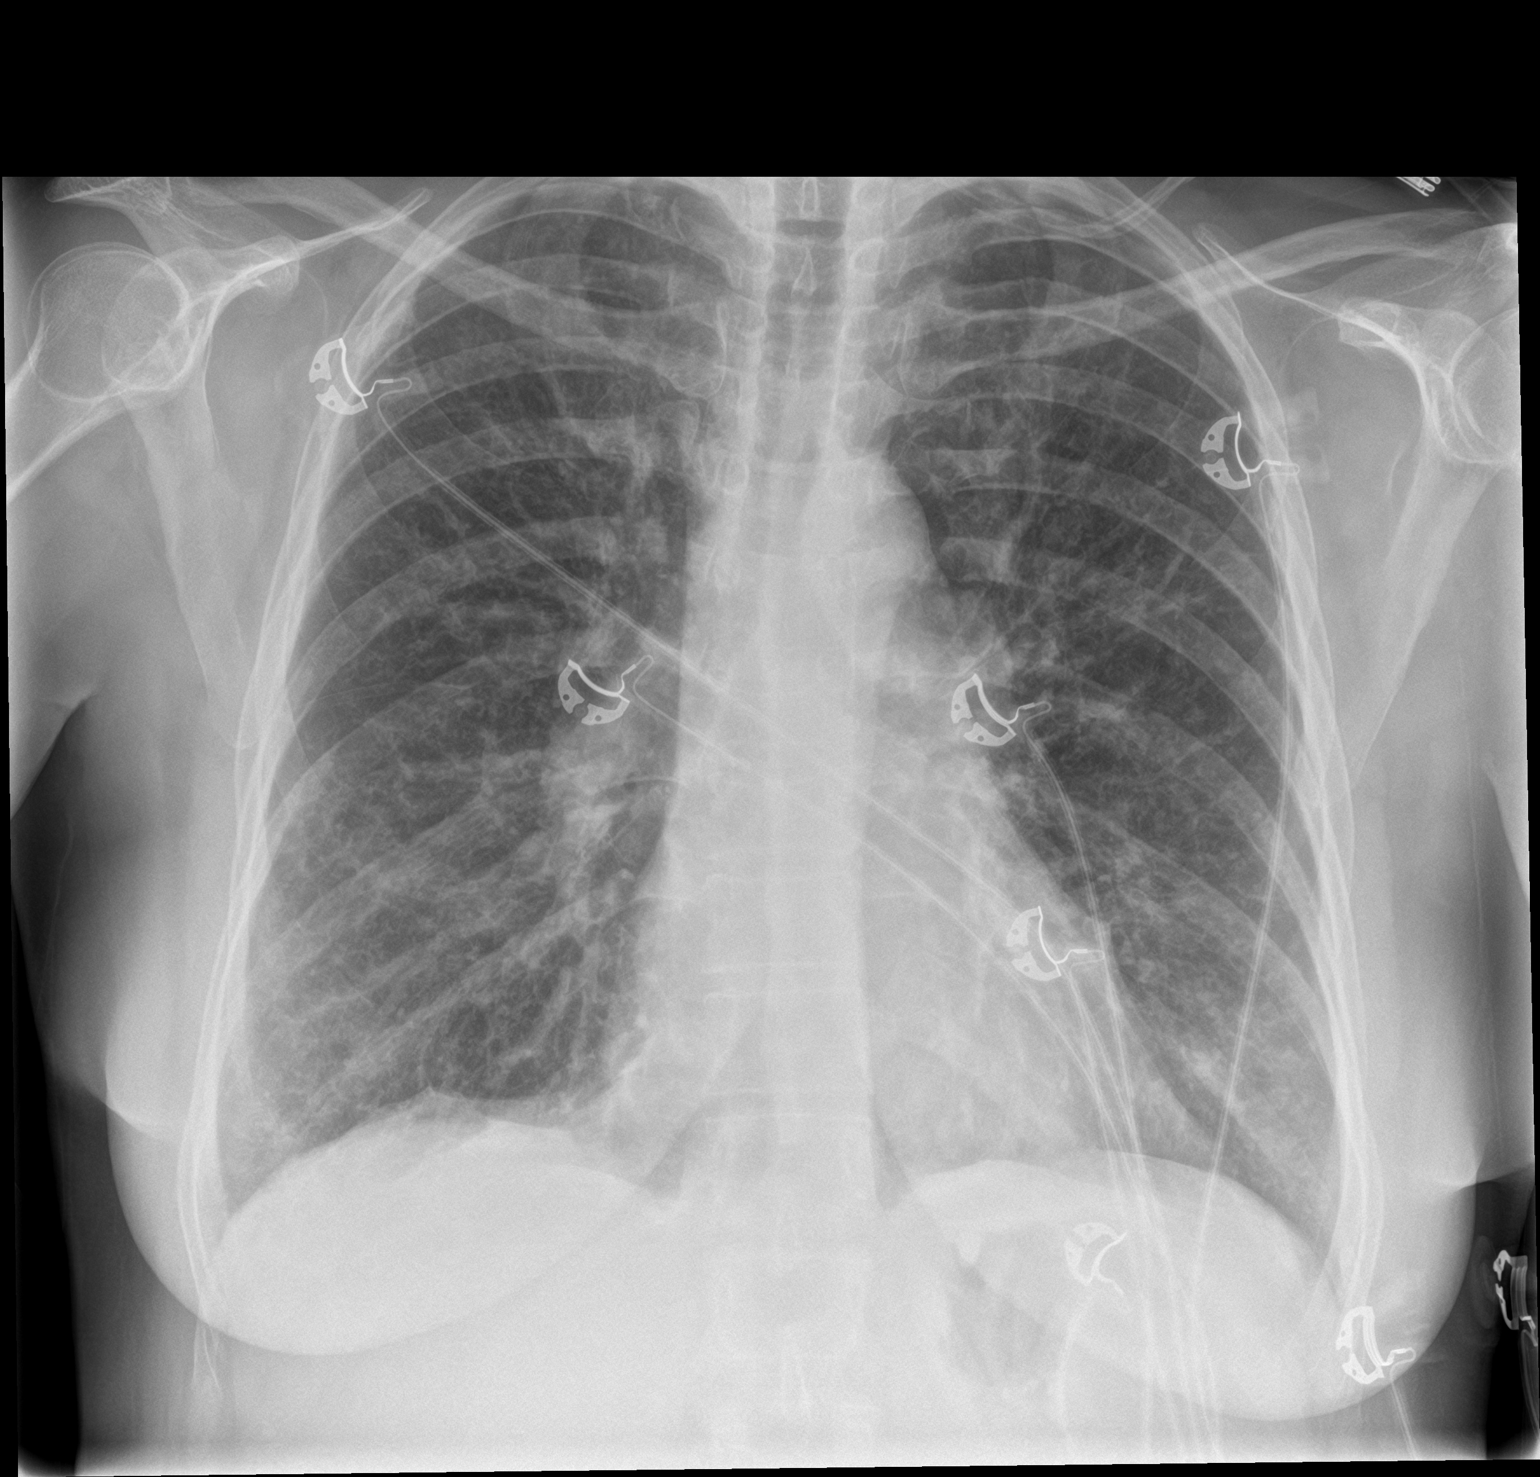

[chest lat]
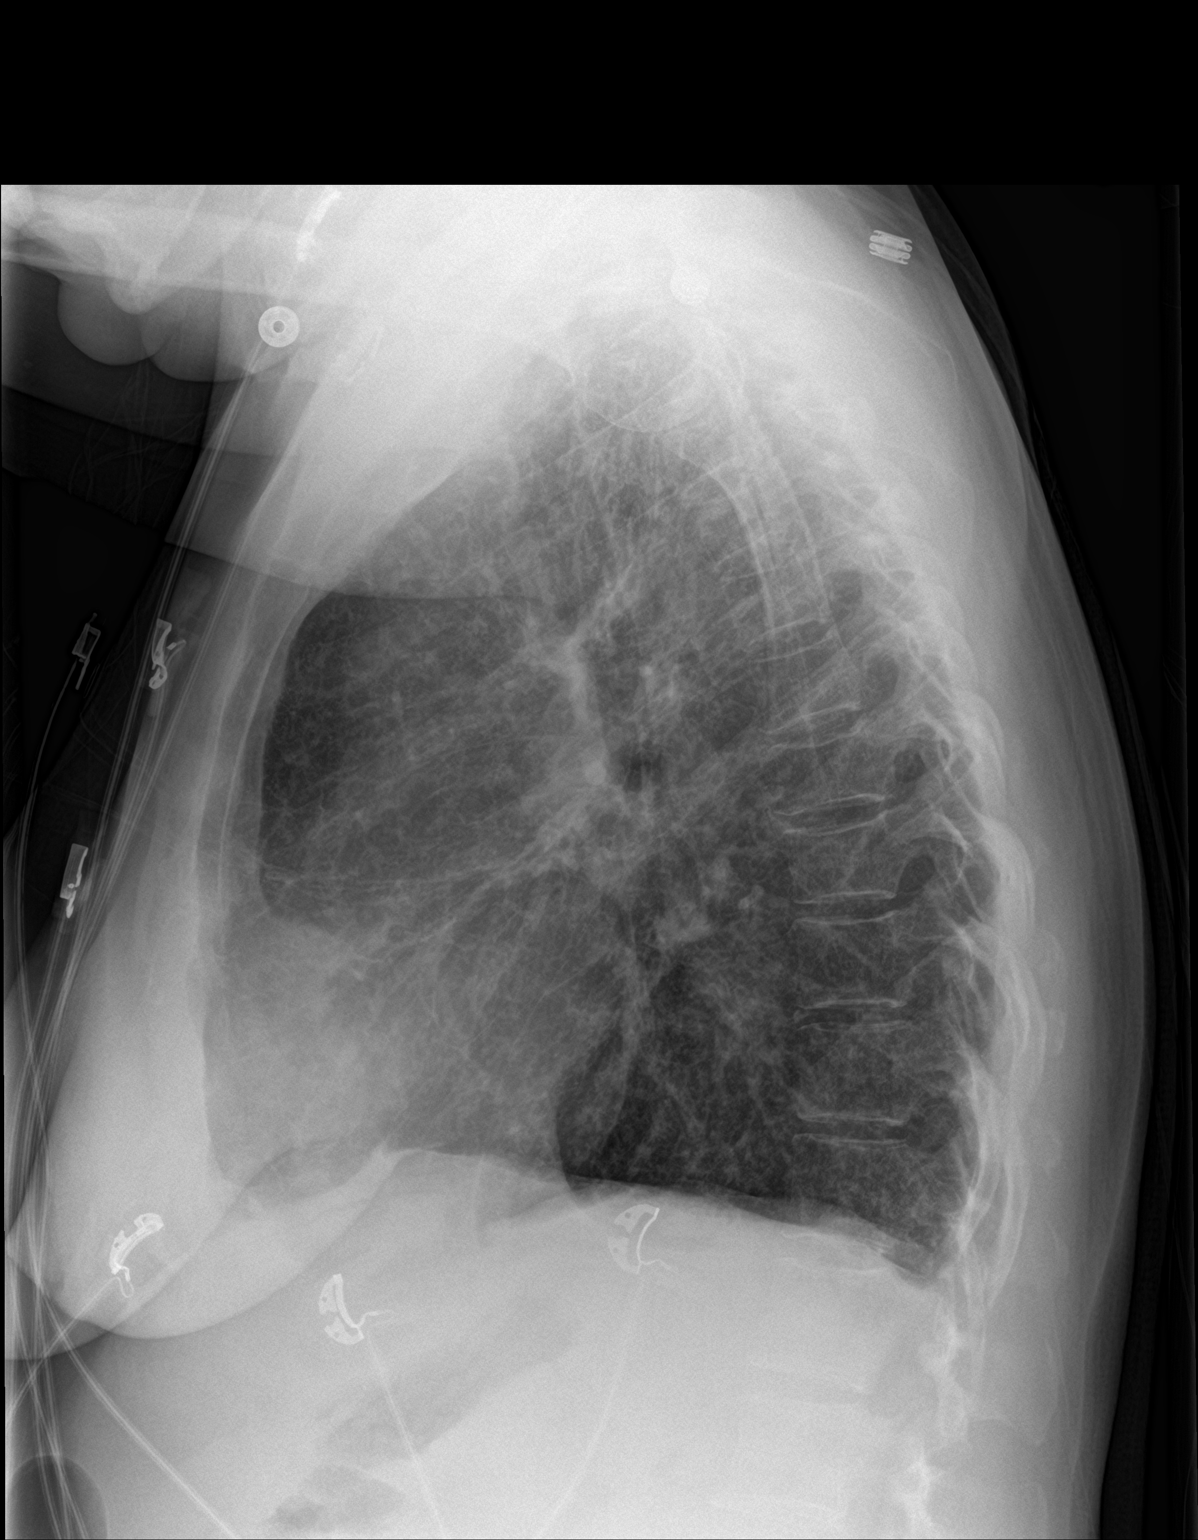

[2 of 2 positions shown; findings below may reference images not displayed]

FINDINGS: There is stable fibrotic type change throughout the lungs, diffuse
but most notable in the basilar regions. There is no frank edema or
consolidation. Heart size and pulmonary vascularity normal. No
adenopathy. No evident bone lesions.
IMPRESSION: Fibrotic change throughout the lungs, most notably in the lung
bases. No edema or consolidation. No new opacity. Stable cardiac
silhouette.

## 2019-09-07 ENCOUNTER — Other Ambulatory Visit: Payer: Self-pay

## 2019-09-07 ENCOUNTER — Emergency Department (HOSPITAL_COMMUNITY)
Admission: EM | Admit: 2019-09-07 | Discharge: 2019-09-07 | Disposition: A | Discharge status: Home / Self Care | Payer: Medicaid Other | Attending: Emergency Medicine | Admitting: Emergency Medicine

## 2019-09-07 ENCOUNTER — Encounter (HOSPITAL_COMMUNITY): Payer: Self-pay | Admitting: Emergency Medicine

## 2019-09-07 ENCOUNTER — Emergency Department (HOSPITAL_COMMUNITY): Payer: Medicaid Other

## 2019-09-07 DIAGNOSIS — Z20828 Contact with and (suspected) exposure to other viral communicable diseases: Secondary | ICD-10-CM | POA: Diagnosis not present

## 2019-09-07 DIAGNOSIS — Y929 Unspecified place or not applicable: Secondary | ICD-10-CM | POA: Diagnosis not present

## 2019-09-07 DIAGNOSIS — F17210 Nicotine dependence, cigarettes, uncomplicated: Secondary | ICD-10-CM | POA: Insufficient documentation

## 2019-09-07 DIAGNOSIS — I10 Essential (primary) hypertension: Secondary | ICD-10-CM | POA: Diagnosis not present

## 2019-09-07 DIAGNOSIS — J441 Chronic obstructive pulmonary disease with (acute) exacerbation: Secondary | ICD-10-CM | POA: Diagnosis not present

## 2019-09-07 DIAGNOSIS — S20211A Contusion of right front wall of thorax, initial encounter: Secondary | ICD-10-CM | POA: Diagnosis not present

## 2019-09-07 DIAGNOSIS — Z7984 Long term (current) use of oral hypoglycemic drugs: Secondary | ICD-10-CM | POA: Insufficient documentation

## 2019-09-07 DIAGNOSIS — Y999 Unspecified external cause status: Secondary | ICD-10-CM | POA: Insufficient documentation

## 2019-09-07 DIAGNOSIS — E119 Type 2 diabetes mellitus without complications: Secondary | ICD-10-CM | POA: Insufficient documentation

## 2019-09-07 DIAGNOSIS — R0602 Shortness of breath: Secondary | ICD-10-CM | POA: Diagnosis present

## 2019-09-07 DIAGNOSIS — X500XXA Overexertion from strenuous movement or load, initial encounter: Secondary | ICD-10-CM | POA: Insufficient documentation

## 2019-09-07 DIAGNOSIS — Y9389 Activity, other specified: Secondary | ICD-10-CM | POA: Insufficient documentation

## 2019-09-07 DIAGNOSIS — Z79899 Other long term (current) drug therapy: Secondary | ICD-10-CM | POA: Diagnosis not present

## 2019-09-07 LAB — CBC WITH DIFFERENTIAL/PLATELET
Abs Immature Granulocytes: 0.05 10*3/uL (ref 0.00–0.07)
Basophils Absolute: 0.1 10*3/uL (ref 0.0–0.1)
Basophils Relative: 0 %
Eosinophils Absolute: 0.1 10*3/uL (ref 0.0–0.5)
Eosinophils Relative: 1 %
HCT: 44.1 % (ref 36.0–46.0)
Hemoglobin: 13.8 g/dL (ref 12.0–15.0)
Immature Granulocytes: 0 %
Lymphocytes Relative: 29 %
Lymphs Abs: 4.9 10*3/uL — ABNORMAL HIGH (ref 0.7–4.0)
MCH: 27.5 pg (ref 26.0–34.0)
MCHC: 31.3 g/dL (ref 30.0–36.0)
MCV: 88 fL (ref 80.0–100.0)
Monocytes Absolute: 1.2 10*3/uL — ABNORMAL HIGH (ref 0.1–1.0)
Monocytes Relative: 7 %
Neutro Abs: 10.6 10*3/uL — ABNORMAL HIGH (ref 1.7–7.7)
Neutrophils Relative %: 63 %
Platelets: 356 10*3/uL (ref 150–400)
RBC: 5.01 MIL/uL (ref 3.87–5.11)
RDW: 12.4 % (ref 11.5–15.5)
WBC: 17 10*3/uL — ABNORMAL HIGH (ref 4.0–10.5)
nRBC: 0 % (ref 0.0–0.2)

## 2019-09-07 LAB — BASIC METABOLIC PANEL
Anion gap: 9 (ref 5–15)
BUN: 5 mg/dL — ABNORMAL LOW (ref 6–20)
CO2: 34 mmol/L — ABNORMAL HIGH (ref 22–32)
Calcium: 9.2 mg/dL (ref 8.9–10.3)
Chloride: 97 mmol/L — ABNORMAL LOW (ref 98–111)
Creatinine, Ser: 0.46 mg/dL (ref 0.44–1.00)
GFR calc Af Amer: >60 mL/min (ref >60)
GFR calc non Af Amer: >60 mL/min (ref >60)
Glucose, Bld: 261 mg/dL — ABNORMAL HIGH (ref 70–99)
Potassium: 3.2 mmol/L — ABNORMAL LOW (ref 3.5–5.1)
Sodium: 140 mmol/L (ref 135–145)

## 2019-09-07 LAB — SARS CORONAVIRUS 2 (TAT 6-24 HRS): SARS Coronavirus 2: NEGATIVE

## 2019-09-07 MED ORDER — PREDNISONE 20 MG PO TABS: 40.0000 mg | ORAL_TABLET | Freq: Every day | ORAL | 0 refills | Status: DC

## 2019-09-07 MED ORDER — AEROCHAMBER Z-STAT PLUS/MEDIUM MISC: 1.0000 | Freq: Once | Status: DC

## 2019-09-07 MED ORDER — PREDNISONE 20 MG PO TABS
40.0000 mg | ORAL_TABLET | Freq: Once | ORAL | Status: AC
  Administered 2019-09-07: 40 mg via ORAL
  Filled 2019-09-07: qty 2

## 2019-09-07 MED ORDER — DOXYCYCLINE HYCLATE 100 MG PO CAPS: 100.0000 mg | ORAL_CAPSULE | Freq: Two times a day (BID) | ORAL | 0 refills | Status: DC

## 2019-09-07 MED ORDER — IBUPROFEN 600 MG PO TABS: 600.0000 mg | ORAL_TABLET | Freq: Four times a day (QID) | ORAL | 0 refills | Status: DC | PRN

## 2019-09-07 MED ORDER — DOXYCYCLINE HYCLATE 100 MG PO TABS
100.0000 mg | ORAL_TABLET | Freq: Once | ORAL | Status: DC
  Filled 2019-09-07: qty 1

## 2019-09-07 MED ORDER — ALBUTEROL SULFATE HFA 108 (90 BASE) MCG/ACT IN AERS
2.0000 | INHALATION_SPRAY | RESPIRATORY_TRACT | Status: AC
  Administered 2019-09-07: 2 via RESPIRATORY_TRACT
  Filled 2019-09-07: qty 6.7

## 2019-09-07 MED ORDER — AZITHROMYCIN 250 MG PO TABS: 250.0000 mg | ORAL_TABLET | Freq: Every day | ORAL | 0 refills | Status: DC

## 2019-09-07 MED ORDER — HYDROCODONE-ACETAMINOPHEN 5-325 MG PO TABS
2.0000 | ORAL_TABLET | Freq: Once | ORAL | Status: AC
  Administered 2019-09-07: 2 via ORAL
  Filled 2019-09-07: qty 2

## 2019-09-07 NOTE — ED Notes (Signed)
RT notified of breathing tx

## 2019-09-07 NOTE — ED Provider Notes (Addendum)
[facility] EMERGENCY DEPARTMENT Provider Note   CSN: [id number] Arrival date & time: 09/07/19  1120     History   Chief Complaint Chief Complaint  Patient presents with  . Shortness of Breath    HPI [name] is a 53 y.o. female.     HPI  This patient is a 53 year old female, she has a known history of diabetes, COPD and requires 3-1/2 L of oxygen chronically.  She has frequent respiratory infections and in fact reports that last week she was on Keflex and prednisone after having some increasing ear pain and ongoing COPD symptoms.  4 days ago while she was sitting in her recliner she leaned over the right side causing pain and an acute popping sound to her right ribs.  Since that time she has had persistent pain on that right side, she has had increasing cough, wheezing, green and yellow sputum with a feeling of subjective fevers.  She lives by herself, she has not been out or around anybody with coronavirus.  She has been using her albuterol inhalers and nebulizers with mild improvement.  She denies any swelling of her legs, denies any significant headache.  Past Medical History:  Diagnosis Date  . Angina   . Anxiety state 10/15/2015  . ARDS (adult respiratory distress syndrome) [facility])    Jan 2011  . Asthma   . Chronic back pain   . COPD (chronic obstructive pulmonary disease) (HCC)   . Diabetes mellitus   . GERD (gastroesophageal reflux disease) 12/21/2012  . HTN (hypertension) 10/15/2015  . Hyperglycemia, drug-induced    steroid induced hyperglycemia  . Migraine headache   . On home O2   . Pneumonia   . Recurrent upper respiratory infection (URI)   . Shortness of breath     Patient Active Problem List   Diagnosis Date Noted  . Atypical pneumonia 09/28/2016  . Chronic obstructive pulmonary disease (HCC)   . Pneumonia 05/30/2016  . SIRS (systemic inflammatory response syndrome) (HCC) 10/16/2015  . Sepsis (HCC) 10/15/2015  . Anxiety state 10/15/2015  . HTN  (hypertension) 10/15/2015  . Hypokalemia 10/15/2015  . COPD with acute exacerbation (HCC) 08/14/2014  . Acute-on-chronic respiratory failure (HCC) 03/13/2014  . CAP (community acquired pneumonia) 03/11/2014  . Community acquired pneumonia 03/11/2014  . COPD exacerbation (HCC) 04/22/2013  . GERD (gastroesophageal reflux disease) 12/21/2012  . Constipation 12/21/2012  . Healthcare-associated pneumonia 02/03/2012  . Candida infection 11/26/2011  . Viral syndrome 11/21/2011  . DM (diabetes mellitus) (HCC) 11/21/2011  . Thrush, oral 07/03/2011  . Low back pain 06/16/2011  . Muscle spasms of lower extremity 06/16/2011  . TOBACCO ABUSE 09/12/2010  . 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 COPD (chronic obstructive pulmonary disease) (HCC) 02/14/2010  . UNSPECIFIED NUTRITIONAL DEFICIENCY 02/13/2010  . ADULT RESPIRATORY DISTRESS SYNDROME 02/13/2010  . DELIRIUM 02/13/2010    Past Surgical History:  Procedure Laterality Date  . c-section    . TRACHEOSTOMY     decannulated 12/2009  . TUBAL LIGATION    . uterine ablasion       OB History   No obstetric history on file.      Home Medications    Prior to Admission medications   Medication Sig Start Date End Date Taking? Authorizing Provider  acetaminophen (TYLENOL) 500 MG tablet Take 500 mg by mouth every 6 (six) hours as needed for fever.     [provider]  albuterol (PROAIR HFA) 108 (90 BASE) MCG/ACT inhaler Inhale  2 puffs into the lungs every 6 (six) hours as needed. For shortness of breath    [provider]  albuterol (PROVENTIL) (2.5 MG/3ML) 0.083% nebulizer solution Take 2.5 mg by nebulization every 4 (four) hours as needed. For shortness of breath  03/19/11   Parrett, Fonnie Mu, NP  budesonide-formoterol (SYMBICORT) 160-4.5 MCG/ACT inhaler Inhale 2 puffs into the lungs 2 (two) times daily.    [provider]  clonazePAM (KLONOPIN) 0.5 MG tablet Take 0.5 mg by mouth 3 (three) times  daily.     [provider]  doxycycline (VIBRAMYCIN) 100 MG capsule Take 1 capsule (100 mg total) by mouth 2 (two) times daily. 09/07/19   Noemi Chapel, MD  fluticasone (FLONASE) 50 MCG/ACT nasal spray Place 2 sprays into both nostrils daily. 04/13/18   [provider]  HYDROcodone-acetaminophen (NORCO/VICODIN) 5-325 MG tablet Take 1-2 tablets by mouth every 6 (six) hours as needed. 02/09/19   Hayden Rasmussen, MD  ibuprofen (ADVIL) 600 MG tablet Take 1 tablet (600 mg total) by mouth every 6 (six) hours as needed. 09/07/19   Noemi Chapel, MD  lisinopril (PRINIVIL,ZESTRIL) 20 MG tablet Take 20 mg by mouth daily.    [provider]  LYRICA 100 MG capsule Take 100 mg by mouth 2 (two) times daily. 05/02/18   [provider]  metFORMIN (GLUCOPHAGE) 500 MG tablet Take 1 tablet by mouth 2 (two) times daily. 04/26/18   [provider]  ondansetron (ZOFRAN ODT) 4 MG disintegrating tablet Take 1 tablet (4 mg total) by mouth every 8 (eight) hours as needed for nausea or vomiting. 02/09/19   Hayden Rasmussen, MD  OxyCODONE (OXYCONTIN) 10 mg T12A Take 1 tablet (10 mg total) by mouth every 12 (twelve) hours. 04/24/13   Sinda Du, MD  oxyCODONE (ROXICODONE) 15 MG immediate release tablet Take 15 mg by mouth every 4 (four) hours.    [provider]  oxyCODONE-acetaminophen (PERCOCET/ROXICET) 5-325 MG tablet Take 1 tablet by mouth every 4 (four) hours as needed for severe pain. 05/10/18   Long, Wonda Olds, MD  pantoprazole (PROTONIX) 40 MG tablet Take 40 mg by mouth daily.    [provider]  predniSONE (DELTASONE) 20 MG tablet Take 2 tablets (40 mg total) by mouth daily. 09/07/19   Noemi Chapel, MD  promethazine (PHENERGAN) 25 MG tablet Take 25 mg by mouth every 6 (six) hours as needed for nausea.     [provider]  sulfamethoxazole-trimethoprim (BACTRIM DS,SEPTRA DS) 800-160 MG tablet Take 1 tablet by mouth 2 (two) times daily. 02/23/17    Sinda Du, MD  SUMAtriptan (IMITREX) 50 MG tablet Take 50 mg by mouth daily as needed for migraine or headache.  08/28/16   [provider]  tiotropium (SPIRIVA) 18 MCG inhalation capsule Place 18 mcg into inhaler and inhale daily.    [provider]  ARIPiprazole (ABILIFY) 5 MG tablet Take 5 mg by mouth daily.    02/03/12  [provider]  venlafaxine (EFFEXOR) 75 MG tablet Take 75 mg by mouth daily.    02/03/12  [provider]    Family History Family History  Problem Relation Age of Onset  . Coronary artery disease Brother   . Diabetes Other   . Cancer Other   . Hypertension Other     Social History Social History   Tobacco Use  . Smoking status: Current Every Day Smoker    Packs/day: 1.00    Years: 33.00    Pack  years: 33.00    Types: Cigarettes  . Smokeless tobacco: Never Used  . Tobacco comment: 1/2 pack a day  since age 47  Substance Use Topics  . Alcohol use: No  . Drug use: No    Comment: Formerly used cocaine , narcotics, THC     Allergies   Penicillins, Levaquin [levofloxacin in d5w], and Morphine   Review of Systems Review of Systems  All other systems reviewed and are negative.    Physical Exam Updated Vital Signs BP 138/84 (BP Location: Left Arm)   Pulse (!) 108   Temp 98.9 F (37.2 C) (Oral)   Resp 17   Ht 1.524 m (5')   Wt 59.9 kg   SpO2 100%   BMI 25.78 kg/m   Physical Exam Vitals signs and nursing note reviewed.  Constitutional:      General: She is not in acute distress.    Appearance: She is well-developed.  HENT:     Head: Normocephalic and atraumatic.     Mouth/Throat:     Pharynx: No oropharyngeal exudate.  Eyes:     General: No scleral icterus.       Right eye: No discharge.        Left eye: No discharge.     Conjunctiva/sclera: Conjunctivae normal.     Pupils: Pupils are equal, round, and reactive to light.  Neck:     Musculoskeletal: Normal range of motion and neck supple.      Thyroid: No thyromegaly.     Vascular: No JVD.  Cardiovascular:     Rate and Rhythm: Normal rate and regular rhythm.     Heart sounds: Normal heart sounds. No murmur. No friction rub. No gallop.   Pulmonary:     Effort: Pulmonary effort is normal. No respiratory distress.     Breath sounds: Wheezing present. No rales.     Comments: Prolonged expiratory phase, diffuse expiratory wheezing in all lung fields, speaks in full sentences.  She has reproducible tenderness to the chest over the right anterolateral lower ribs without crepitance or subcutaneous emphysema.  She is able to take deep breaths with minimal pain in the right side Chest:     Chest wall: Tenderness present.  Abdominal:     General: Bowel sounds are normal. There is no distension.     Palpations: Abdomen is soft. There is no mass.     Tenderness: There is no abdominal tenderness.  Musculoskeletal: Normal range of motion.        General: No tenderness.  Lymphadenopathy:     Cervical: No cervical adenopathy.  Skin:    General: Skin is warm and dry.     Findings: No erythema or rash.  Neurological:     Mental Status: She is alert.     Coordination: Coordination normal.  Psychiatric:        Behavior: Behavior normal.      ED Treatments / Results  Labs (all labs ordered are listed, but only abnormal results are displayed) Labs Reviewed  CBC WITH DIFFERENTIAL/PLATELET - Abnormal; Notable for the following components:      Result Value   WBC 17.0 (*)    Neutro Abs 10.6 (*)    Lymphs Abs 4.9 (*)    Monocytes Absolute 1.2 (*)    All other components within normal limits  BASIC METABOLIC PANEL - Abnormal; Notable for the following components:   Potassium 3.2 (*)    Chloride 97 (*)    CO2 34 (*)  Glucose, Bld 261 (*)    BUN 5 (*)    All other components within normal limits  SARS CORONAVIRUS 2 (TAT 6-24 HRS)    EKG None  Radiology Dg Ribs Unilateral W/chest Right  Result Date: 09/07/2019 CLINICAL DATA:   Right anterior rib pain post trauma. EXAM: RIGHT RIBS AND CHEST - 3+ VIEW COMPARISON:  February 09, 2019 FINDINGS: No fracture or other bone lesions are seen involving the ribs. There is no evidence of pneumothorax or pleural effusion. Patchy bilateral interstitial thickening with lower lobe predominance. Heart size and mediastinal contours are within normal limits. IMPRESSION: 1. No evidence of displaced rib fractures. 2. Patchy bilateral interstitial thickening with lower lobe predominance, chronic. Electronically Signed   By: Ted Mcalpineobrinka  Dimitrova M.D.   On: 09/07/2019 13:27    Procedures Procedures (including critical care time)  Medications Ordered in ED Medications  doxycycline (VIBRA-TABS) tablet 100 mg (100 mg Oral Refused 09/07/19 1150)  aerochamber Z-Stat Plus/medium 1 each (has no administration in time range)  predniSONE (DELTASONE) tablet 40 mg (40 mg Oral Given 09/07/19 1150)  albuterol (VENTOLIN HFA) 108 (90 Base) MCG/ACT inhaler 2 puff (2 puffs Inhalation Given 09/07/19 1225)  HYDROcodone-acetaminophen (NORCO/VICODIN) 5-325 MG per tablet 2 tablet (2 tablets Oral Given 09/07/19 1331)     Initial Impression / Assessment and Plan / ED Course  I have reviewed the triage vital signs and the nursing notes.  Pertinent labs & imaging results that were available during my care of the patient were reviewed by me and considered in my medical decision making (see chart for details).  Clinical Course as of Sep 06 1348  Thu Sep 07, 2019  1346 No fractures - no pna - WBC suggest likely from prednisone or bronchitis - doxy and home - pt agreeable.   [BM]    Clinical Course User Index [BM] Eber HongMiller, Anayiah Howden, MD       Overall the patient appears to have COPD, there may be pneumonia, possible fracture, doubt Covid.  She will be given albuterol inhaler with metered-dose inhaler with spacer, prednisone, rule out infection though with her increasing amounts of sputum she likely will need coverage  with doxycycline or the such.  We will send outpatient Covid test as I anticipate discharge as long as the patient is not significantly hypoxic.  The patient reportedly cannot take doxycycline, this was discontinued and Zithromax was added.  Final Clinical Impressions(s) / ED Diagnoses   Final diagnoses:  Contusion of right chest wall, initial encounter  COPD exacerbation [facility](HCC)    ED Discharge Orders         Ordered    doxycycline (VIBRAMYCIN) 100 MG capsule  2 times daily     09/07/19 1348    predniSONE (DELTASONE) 20 MG tablet  Daily     09/07/19 1348    ibuprofen (ADVIL) 600 MG tablet  Every 6 hours PRN     09/07/19 1348    Incentive spirometry RT     09/07/19 1348    azithromycin (ZITHROMAX Z-PAK) 250 MG tablet  Daily     09/07/19 1402           Eber HongMiller, Dezaree Tracey, MD 09/07/19 1349    Eber HongMiller, Samer Dutton, MD 09/07/19 [phone]

## 2019-09-07 NOTE — Discharge Instructions (Addendum)
Zithromax for 5 days  Take prednisone 40 mg daily for the next 5 days  Use the incentive spirometer to help take deep breaths  Take ibuprofen 3 times a day for pain  Your testing today shows no signs of pneumonia, no signs of rib fracture, you likely have a bruised wall, this will get better over the next couple of weeks  See your doctor for ongoing pain or return to the ER for severe or worsening symptoms

## 2019-09-07 NOTE — ED Triage Notes (Signed)
Injury to right rib son Sunday, after leaning over chair.  Home O2@3 .5l/m.  Pain to rib with touch.

## 2019-09-07 NOTE — ED Notes (Signed)
Patient transported to X-ray 

## 2019-11-27 ENCOUNTER — Encounter (HOSPITAL_COMMUNITY): Payer: Self-pay | Admitting: Emergency Medicine

## 2019-11-27 ENCOUNTER — Emergency Department (HOSPITAL_COMMUNITY): Payer: Medicaid Other

## 2019-11-27 ENCOUNTER — Inpatient Hospital Stay (HOSPITAL_COMMUNITY): Payer: Medicaid Other

## 2019-11-27 ENCOUNTER — Inpatient Hospital Stay (HOSPITAL_COMMUNITY)
Admission: EM | Admit: 2019-11-27 | Discharge: 2019-11-29 | DRG: 193 | Disposition: A | Discharge status: Home / Self Care | Payer: Medicaid Other | Attending: Internal Medicine | Admitting: Internal Medicine

## 2019-11-27 ENCOUNTER — Other Ambulatory Visit: Payer: Self-pay

## 2019-11-27 DIAGNOSIS — F17210 Nicotine dependence, cigarettes, uncomplicated: Secondary | ICD-10-CM | POA: Diagnosis present

## 2019-11-27 DIAGNOSIS — G894 Chronic pain syndrome: Secondary | ICD-10-CM

## 2019-11-27 DIAGNOSIS — Z8701 Personal history of pneumonia (recurrent): Secondary | ICD-10-CM | POA: Diagnosis not present

## 2019-11-27 DIAGNOSIS — K219 Gastro-esophageal reflux disease without esophagitis: Secondary | ICD-10-CM | POA: Diagnosis present

## 2019-11-27 DIAGNOSIS — T380X5A Adverse effect of glucocorticoids and synthetic analogues, initial encounter: Secondary | ICD-10-CM | POA: Diagnosis present

## 2019-11-27 DIAGNOSIS — I10 Essential (primary) hypertension: Secondary | ICD-10-CM | POA: Diagnosis present

## 2019-11-27 DIAGNOSIS — Z20822 Contact with and (suspected) exposure to covid-19: Secondary | ICD-10-CM | POA: Diagnosis present

## 2019-11-27 DIAGNOSIS — R109 Unspecified abdominal pain: Secondary | ICD-10-CM | POA: Diagnosis present

## 2019-11-27 DIAGNOSIS — J189 Pneumonia, unspecified organism: Secondary | ICD-10-CM | POA: Diagnosis not present

## 2019-11-27 DIAGNOSIS — Z880 Allergy status to penicillin: Secondary | ICD-10-CM

## 2019-11-27 DIAGNOSIS — J440 Chronic obstructive pulmonary disease with acute lower respiratory infection: Secondary | ICD-10-CM | POA: Diagnosis present

## 2019-11-27 DIAGNOSIS — J9621 Acute and chronic respiratory failure with hypoxia: Secondary | ICD-10-CM | POA: Diagnosis present

## 2019-11-27 DIAGNOSIS — Z7951 Long term (current) use of inhaled steroids: Secondary | ICD-10-CM | POA: Diagnosis not present

## 2019-11-27 DIAGNOSIS — Z7952 Long term (current) use of systemic steroids: Secondary | ICD-10-CM | POA: Diagnosis not present

## 2019-11-27 DIAGNOSIS — J181 Lobar pneumonia, unspecified organism: Secondary | ICD-10-CM | POA: Diagnosis present

## 2019-11-27 DIAGNOSIS — J441 Chronic obstructive pulmonary disease with (acute) exacerbation: Secondary | ICD-10-CM | POA: Diagnosis present

## 2019-11-27 DIAGNOSIS — Z7984 Long term (current) use of oral hypoglycemic drugs: Secondary | ICD-10-CM | POA: Diagnosis not present

## 2019-11-27 DIAGNOSIS — I454 Nonspecific intraventricular block: Secondary | ICD-10-CM | POA: Diagnosis present

## 2019-11-27 DIAGNOSIS — J9622 Acute and chronic respiratory failure with hypercapnia: Secondary | ICD-10-CM | POA: Diagnosis present

## 2019-11-27 DIAGNOSIS — Z888 Allergy status to other drugs, medicaments and biological substances status: Secondary | ICD-10-CM

## 2019-11-27 DIAGNOSIS — Z8249 Family history of ischemic heart disease and other diseases of the circulatory system: Secondary | ICD-10-CM | POA: Diagnosis not present

## 2019-11-27 DIAGNOSIS — F419 Anxiety disorder, unspecified: Secondary | ICD-10-CM | POA: Diagnosis present

## 2019-11-27 DIAGNOSIS — R7989 Other specified abnormal findings of blood chemistry: Secondary | ICD-10-CM | POA: Diagnosis present

## 2019-11-27 DIAGNOSIS — Z833 Family history of diabetes mellitus: Secondary | ICD-10-CM | POA: Diagnosis not present

## 2019-11-27 DIAGNOSIS — Z79899 Other long term (current) drug therapy: Secondary | ICD-10-CM | POA: Diagnosis not present

## 2019-11-27 DIAGNOSIS — E1165 Type 2 diabetes mellitus with hyperglycemia: Secondary | ICD-10-CM | POA: Diagnosis present

## 2019-11-27 DIAGNOSIS — Z9981 Dependence on supplemental oxygen: Secondary | ICD-10-CM | POA: Diagnosis not present

## 2019-11-27 LAB — CBC WITH DIFFERENTIAL/PLATELET
Abs Immature Granulocytes: 0.09 10*3/uL — ABNORMAL HIGH (ref 0.00–0.07)
Basophils Absolute: 0.1 10*3/uL (ref 0.0–0.1)
Basophils Relative: 0 %
Eosinophils Absolute: 0 10*3/uL (ref 0.0–0.5)
Eosinophils Relative: 0 %
HCT: 37.6 % (ref 36.0–46.0)
Hemoglobin: 11.8 g/dL — ABNORMAL LOW (ref 12.0–15.0)
Immature Granulocytes: 1 %
Lymphocytes Relative: 8 %
Lymphs Abs: 1.6 10*3/uL (ref 0.7–4.0)
MCH: 27.8 pg (ref 26.0–34.0)
MCHC: 31.4 g/dL (ref 30.0–36.0)
MCV: 88.5 fL (ref 80.0–100.0)
Monocytes Absolute: 2.1 10*3/uL — ABNORMAL HIGH (ref 0.1–1.0)
Monocytes Relative: 11 %
Neutro Abs: 15.4 10*3/uL — ABNORMAL HIGH (ref 1.7–7.7)
Neutrophils Relative %: 80 %
Platelets: 441 10*3/uL — ABNORMAL HIGH (ref 150–400)
RBC: 4.25 MIL/uL (ref 3.87–5.11)
RDW: 12.2 % (ref 11.5–15.5)
WBC: 19.2 10*3/uL — ABNORMAL HIGH (ref 4.0–10.5)
nRBC: 0 % (ref 0.0–0.2)

## 2019-11-27 LAB — URINALYSIS, ROUTINE W REFLEX MICROSCOPIC
Glucose, UA: >=500 mg/dL — AB
Ketones, ur: 20 mg/dL — AB
Leukocytes,Ua: NEGATIVE
Nitrite: NEGATIVE
Protein, ur: 100 mg/dL — AB
Specific Gravity, Urine: 1.029 (ref 1.005–1.030)
pH: 5 (ref 5.0–8.0)

## 2019-11-27 LAB — COMPREHENSIVE METABOLIC PANEL
ALT: 12 U/L (ref 0–44)
AST: 14 U/L — ABNORMAL LOW (ref 15–41)
Albumin: 3.3 g/dL — ABNORMAL LOW (ref 3.5–5.0)
Alkaline Phosphatase: 100 U/L (ref 38–126)
Anion gap: 15 (ref 5–15)
BUN: 13 mg/dL (ref 6–20)
CO2: 36 mmol/L — ABNORMAL HIGH (ref 22–32)
Calcium: 9.1 mg/dL (ref 8.9–10.3)
Chloride: 83 mmol/L — ABNORMAL LOW (ref 98–111)
Creatinine, Ser: 0.37 mg/dL — ABNORMAL LOW (ref 0.44–1.00)
GFR calc Af Amer: >60 mL/min (ref >60)
GFR calc non Af Amer: >60 mL/min (ref >60)
Glucose, Bld: 285 mg/dL — ABNORMAL HIGH (ref 70–99)
Potassium: 3.5 mmol/L (ref 3.5–5.1)
Sodium: 134 mmol/L — ABNORMAL LOW (ref 135–145)
Total Bilirubin: 0.7 mg/dL (ref 0.3–1.2)
Total Protein: 7.8 g/dL (ref 6.5–8.1)

## 2019-11-27 LAB — BLOOD GAS, ARTERIAL
Acid-Base Excess: 15.5 mmol/L — ABNORMAL HIGH (ref 0.0–2.0)
Bicarbonate: 36.9 mmol/L — ABNORMAL HIGH (ref 20.0–28.0)
FIO2: 36
O2 Saturation: 96.6 %
Patient temperature: 37.9
pCO2 arterial: 85.8 mmHg (ref 32.0–48.0)
pH, Arterial: 7.314 — ABNORMAL LOW (ref 7.350–7.450)
pO2, Arterial: 88.5 mmHg (ref 83.0–108.0)

## 2019-11-27 LAB — D-DIMER, QUANTITATIVE: D-Dimer, Quant: 1.64 ug/mL-FEU — ABNORMAL HIGH (ref 0.00–0.50)

## 2019-11-27 LAB — C-REACTIVE PROTEIN: CRP: 26.3 mg/dL — ABNORMAL HIGH (ref <1.0)

## 2019-11-27 LAB — PROTIME-INR
INR: 1 (ref 0.8–1.2)
Prothrombin Time: 13.3 seconds (ref 11.4–15.2)

## 2019-11-27 LAB — LACTIC ACID, PLASMA
Lactic Acid, Venous: 0.8 mmol/L (ref 0.5–1.9)
Lactic Acid, Venous: 1 mmol/L (ref 0.5–1.9)

## 2019-11-27 LAB — RESPIRATORY PANEL BY RT PCR (FLU A&B, COVID)
Influenza A by PCR: NEGATIVE
Influenza B by PCR: NEGATIVE
SARS Coronavirus 2 by RT PCR: NEGATIVE

## 2019-11-27 LAB — POC URINE PREG, ED: Preg Test, Ur: NEGATIVE

## 2019-11-27 LAB — TROPONIN I (HIGH SENSITIVITY)
Troponin I (High Sensitivity): 3 ng/L (ref <18)
Troponin I (High Sensitivity): 3 ng/L (ref <18)

## 2019-11-27 LAB — GLUCOSE, CAPILLARY: Glucose-Capillary: 404 mg/dL — ABNORMAL HIGH (ref 70–99)

## 2019-11-27 LAB — PROCALCITONIN: Procalcitonin: 0.21 ng/mL

## 2019-11-27 LAB — LACTATE DEHYDROGENASE: LDH: 124 U/L (ref 98–192)

## 2019-11-27 LAB — FERRITIN: Ferritin: 392 ng/mL — ABNORMAL HIGH (ref 11–307)

## 2019-11-27 LAB — POC SARS CORONAVIRUS 2 AG -  ED: SARS Coronavirus 2 Ag: NEGATIVE

## 2019-11-27 LAB — FIBRINOGEN: Fibrinogen: >800 mg/dL — ABNORMAL HIGH (ref 210–475)

## 2019-11-27 LAB — APTT: aPTT: 31 seconds (ref 24–36)

## 2019-11-27 LAB — LIPASE, BLOOD: Lipase: 14 U/L (ref 11–51)

## 2019-11-27 MED ORDER — CLONAZEPAM 0.5 MG PO TABS
0.5000 mg | ORAL_TABLET | Freq: Three times a day (TID) | ORAL | Status: DC
  Administered 2019-11-27 – 2019-11-29 (×6): 0.5 mg via ORAL
  Filled 2019-11-27 (×6): qty 1

## 2019-11-27 MED ORDER — PREGABALIN 50 MG PO CAPS
100.0000 mg | ORAL_CAPSULE | Freq: Two times a day (BID) | ORAL | Status: DC
  Administered 2019-11-27 – 2019-11-29 (×4): 100 mg via ORAL
  Filled 2019-11-27 (×4): qty 2

## 2019-11-27 MED ORDER — ACETAMINOPHEN 325 MG PO TABS
650.0000 mg | ORAL_TABLET | Freq: Four times a day (QID) | ORAL | Status: DC | PRN
  Administered 2019-11-28 (×2): 650 mg via ORAL
  Filled 2019-11-27 (×2): qty 2

## 2019-11-27 MED ORDER — SODIUM CHLORIDE 0.9 % IV SOLN
500.0000 mg | INTRAVENOUS | Status: DC
  Administered 2019-11-28 – 2019-11-29 (×2): 500 mg via INTRAVENOUS
  Filled 2019-11-27 (×2): qty 500

## 2019-11-27 MED ORDER — INSULIN ASPART 100 UNIT/ML ~~LOC~~ SOLN
10.0000 [IU] | Freq: Once | SUBCUTANEOUS | Status: AC
  Administered 2019-11-27: 10 [IU] via SUBCUTANEOUS

## 2019-11-27 MED ORDER — OXYCODONE HCL 5 MG PO TABS
10.0000 mg | ORAL_TABLET | Freq: Four times a day (QID) | ORAL | Status: DC | PRN
  Administered 2019-11-28 – 2019-11-29 (×6): 10 mg via ORAL
  Filled 2019-11-27 (×6): qty 2

## 2019-11-27 MED ORDER — SODIUM CHLORIDE 0.9 % IV SOLN
1.0000 g | Freq: Once | INTRAVENOUS | Status: AC
  Administered 2019-11-27: 1 g via INTRAVENOUS
  Filled 2019-11-27: qty 10

## 2019-11-27 MED ORDER — BUDESONIDE 0.5 MG/2ML IN SUSP
0.5000 mg | Freq: Two times a day (BID) | RESPIRATORY_TRACT | Status: DC
  Administered 2019-11-27 – 2019-11-29 (×4): 0.5 mg via RESPIRATORY_TRACT
  Filled 2019-11-27 (×4): qty 2

## 2019-11-27 MED ORDER — MOMETASONE FURO-FORMOTEROL FUM 200-5 MCG/ACT IN AERO: 2.0000 | INHALATION_SPRAY | Freq: Two times a day (BID) | RESPIRATORY_TRACT | Status: DC

## 2019-11-27 MED ORDER — PROMETHAZINE HCL 25 MG/ML IJ SOLN: 12.5000 mg | Freq: Four times a day (QID) | INTRAMUSCULAR | Status: DC | PRN

## 2019-11-27 MED ORDER — ALBUTEROL SULFATE HFA 108 (90 BASE) MCG/ACT IN AERS
4.0000 | INHALATION_SPRAY | Freq: Once | RESPIRATORY_TRACT | Status: AC
  Administered 2019-11-27: 4 via RESPIRATORY_TRACT
  Filled 2019-11-27: qty 6.7

## 2019-11-27 MED ORDER — IOHEXOL 350 MG/ML SOLN
100.0000 mL | Freq: Once | INTRAVENOUS | Status: AC | PRN
  Administered 2019-11-27: 100 mL via INTRAVENOUS

## 2019-11-27 MED ORDER — OXYCODONE-ACETAMINOPHEN 5-325 MG PO TABS
2.0000 | ORAL_TABLET | Freq: Once | ORAL | Status: AC
  Administered 2019-11-27: 2 via ORAL
  Filled 2019-11-27: qty 2

## 2019-11-27 MED ORDER — ONDANSETRON HCL 4 MG/2ML IJ SOLN
4.0000 mg | Freq: Once | INTRAMUSCULAR | Status: AC
  Administered 2019-11-27: 4 mg via INTRAVENOUS
  Filled 2019-11-27: qty 2

## 2019-11-27 MED ORDER — INFLUENZA VAC SPLIT QUAD 0.5 ML IM SUSY: 0.5000 mL | PREFILLED_SYRINGE | INTRAMUSCULAR | Status: DC

## 2019-11-27 MED ORDER — INSULIN ASPART 100 UNIT/ML ~~LOC~~ SOLN
0.0000 [IU] | Freq: Three times a day (TID) | SUBCUTANEOUS | Status: DC
  Administered 2019-11-28: 5 [IU] via SUBCUTANEOUS
  Administered 2019-11-28 (×2): 8 [IU] via SUBCUTANEOUS
  Administered 2019-11-29: 11 [IU] via SUBCUTANEOUS
  Administered 2019-11-29: 5 [IU] via SUBCUTANEOUS
  Administered 2019-11-29: 11 [IU] via SUBCUTANEOUS

## 2019-11-27 MED ORDER — PANTOPRAZOLE SODIUM 40 MG PO TBEC
40.0000 mg | DELAYED_RELEASE_TABLET | Freq: Two times a day (BID) | ORAL | Status: DC
  Administered 2019-11-28 – 2019-11-29 (×4): 40 mg via ORAL
  Filled 2019-11-27 (×4): qty 1

## 2019-11-27 MED ORDER — METHYLPREDNISOLONE SODIUM SUCC 125 MG IJ SOLR
125.0000 mg | Freq: Once | INTRAMUSCULAR | Status: AC
  Administered 2019-11-27: 125 mg via INTRAVENOUS
  Filled 2019-11-27: qty 2

## 2019-11-27 MED ORDER — SODIUM CHLORIDE 0.9 % IV SOLN
500.0000 mg | Freq: Once | INTRAVENOUS | Status: AC
  Administered 2019-11-27: 500 mg via INTRAVENOUS
  Filled 2019-11-27: qty 500

## 2019-11-27 MED ORDER — ONDANSETRON HCL 4 MG/2ML IJ SOLN
4.0000 mg | Freq: Four times a day (QID) | INTRAMUSCULAR | Status: DC
  Administered 2019-11-27 – 2019-11-29 (×7): 4 mg via INTRAVENOUS
  Filled 2019-11-27 (×8): qty 2

## 2019-11-27 MED ORDER — VANCOMYCIN HCL 1250 MG/250ML IV SOLN
1250.0000 mg | Freq: Once | INTRAVENOUS | Status: DC
  Filled 2019-11-27: qty 250

## 2019-11-27 MED ORDER — INSULIN ASPART 100 UNIT/ML ~~LOC~~ SOLN
0.0000 [IU] | Freq: Every day | SUBCUTANEOUS | Status: DC
  Administered 2019-11-28: 3 [IU] via SUBCUTANEOUS

## 2019-11-27 MED ORDER — FLUTICASONE PROPIONATE 50 MCG/ACT NA SUSP
2.0000 | Freq: Every day | NASAL | Status: DC
  Administered 2019-11-28 – 2019-11-29 (×2): 2 via NASAL
  Filled 2019-11-27 (×2): qty 16

## 2019-11-27 MED ORDER — ACETAMINOPHEN 650 MG RE SUPP: 650.0000 mg | Freq: Four times a day (QID) | RECTAL | Status: DC | PRN

## 2019-11-27 MED ORDER — SODIUM CHLORIDE 0.9 % IV SOLN
1.0000 g | INTRAVENOUS | Status: DC
  Administered 2019-11-28 – 2019-11-29 (×2): 1 g via INTRAVENOUS
  Filled 2019-11-27 (×2): qty 10

## 2019-11-27 MED ORDER — SODIUM CHLORIDE 0.9 % IV SOLN: 1.0000 g | INTRAVENOUS | Status: DC

## 2019-11-27 MED ORDER — ENOXAPARIN SODIUM 40 MG/0.4ML ~~LOC~~ SOLN
40.0000 mg | SUBCUTANEOUS | Status: DC
  Administered 2019-11-27 – 2019-11-28 (×2): 40 mg via SUBCUTANEOUS
  Filled 2019-11-27 (×2): qty 0.4

## 2019-11-27 MED ORDER — METHYLPREDNISOLONE SODIUM SUCC 125 MG IJ SOLR: 60.0000 mg | Freq: Two times a day (BID) | INTRAMUSCULAR | Status: DC

## 2019-11-27 MED ORDER — IPRATROPIUM-ALBUTEROL 20-100 MCG/ACT IN AERS: 1.0000 | INHALATION_SPRAY | Freq: Four times a day (QID) | RESPIRATORY_TRACT | Status: DC

## 2019-11-27 MED ORDER — METHYLPREDNISOLONE SODIUM SUCC 125 MG IJ SOLR
60.0000 mg | Freq: Three times a day (TID) | INTRAMUSCULAR | Status: DC
  Administered 2019-11-27 – 2019-11-29 (×5): 60 mg via INTRAVENOUS
  Filled 2019-11-27 (×5): qty 2

## 2019-11-27 MED ORDER — IPRATROPIUM-ALBUTEROL 0.5-2.5 (3) MG/3ML IN SOLN
3.0000 mL | Freq: Four times a day (QID) | RESPIRATORY_TRACT | Status: DC
  Administered 2019-11-27 – 2019-11-28 (×3): 3 mL via RESPIRATORY_TRACT
  Filled 2019-11-27 (×3): qty 3

## 2019-11-27 NOTE — ED Notes (Signed)
Pt's o2 increased to 5L with improvement in sats.

## 2019-11-27 NOTE — ED Notes (Signed)
Antibiotics were started prior to blood cultures due to patient being a difficult.

## 2019-11-27 NOTE — ED Notes (Signed)
Lab and respiratory in room trying to obtain ABG and Blood Cx

## 2019-11-27 NOTE — Progress Notes (Signed)
Notified bedside nurse of need to draw blood cultures and lactate. 

## 2019-11-27 NOTE — ED Provider Notes (Addendum)
[facility] EMERGENCY DEPARTMENT Provider Note   CSN: [id number] Arrival date & time: 11/27/19  6010     History Chief Complaint  Patient presents with  . Shortness of Breath    [name] is a 54 y.o. female with a history most significant for COPD with chronic home O2 3 L at baseline, diabetes, GERD, hypertension and history of pneumonia presenting with increased shortness of breath along with thick green sputum production since yesterday.  She denies documented fevers but has been chilled, has had increasing shortness of breath with exertion, progressed to shortness of breath at rest.  She has used her albuterol MDI without relief of symptoms.  She does endorse midsternal pain triggered by cough.  She has had increasing fatigue, denies dizziness.  No nausea, vomiting, no diaphoresis.  She reports palpitations since her symptoms began.  She denies known Covid exposures.  PCP is Dr. Luan Pulling.  The history is provided by the patient.       Past Medical History:  Diagnosis Date  . Angina   . Anxiety state 10/15/2015  . ARDS (adult respiratory distress syndrome) [facility])    Jan 2011  . Asthma   . Chronic back pain   . COPD (chronic obstructive pulmonary disease) ([place])   . Diabetes mellitus   . GERD (gastroesophageal reflux disease) 12/21/2012  . HTN (hypertension) 10/15/2015  . Hyperglycemia, drug-induced    steroid induced hyperglycemia  . Migraine headache   . On home O2   . Pneumonia   . Recurrent upper respiratory infection (URI)   . Shortness of breath     Patient Active Problem List   Diagnosis Date Noted  . Atypical pneumonia 09/28/2016  . Chronic obstructive pulmonary disease ([place])   . Pneumonia 05/30/2016  . SIRS (systemic inflammatory response syndrome) ([place]) 10/16/2015  . Sepsis ([place]) 10/15/2015  . Anxiety state 10/15/2015  . HTN (hypertension) 10/15/2015  . Hypokalemia 10/15/2015  . COPD with acute exacerbation ([place]) 08/14/2014  . Acute-on-chronic  respiratory failure ([place]) 03/13/2014  . CAP (community acquired pneumonia) 03/11/2014  . Community acquired pneumonia 03/11/2014  . COPD exacerbation ([place]) 04/22/2013  . GERD (gastroesophageal reflux disease) 12/21/2012  . Constipation 12/21/2012  . Healthcare-associated pneumonia 02/03/2012  . Candida infection 11/26/2011  . Viral syndrome 11/21/2011  . DM (diabetes mellitus) ([place]) 11/21/2011  . Thrush, oral 07/03/2011  . Low back pain 06/16/2011  . Muscle spasms of lower extremity 06/16/2011  . TOBACCO ABUSE 09/12/2010  . 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 COPD (chronic obstructive pulmonary disease) ([place]) 02/14/2010  . UNSPECIFIED NUTRITIONAL DEFICIENCY 02/13/2010  . ADULT RESPIRATORY DISTRESS SYNDROME 02/13/2010  . DELIRIUM 02/13/2010    Past Surgical History:  Procedure Laterality Date  . c-section    . TRACHEOSTOMY     decannulated 12/2009  . TUBAL LIGATION    . uterine ablasion       OB History   No obstetric history on file.     Family History  Problem Relation Age of Onset  . Coronary artery disease Brother   . Diabetes Other   . Cancer Other   . Hypertension Other     Social History   Tobacco Use  . Smoking status: Current Every Day Smoker    Packs/day: 1.00    Years: 33.00    Pack years: 33.00    Types: Cigarettes  . Smokeless tobacco: Never Used  . Tobacco comment: 1/2 pack a day  since age 75  Substance Use  Topics  . Alcohol use: No  . Drug use: No    Comment: Formerly used cocaine , narcotics, THC    Home Medications Prior to Admission medications   Medication Sig Start Date End Date Taking? Authorizing Provider  acetaminophen (TYLENOL) 500 MG tablet Take 500 mg by mouth every 6 (six) hours as needed for fever.     [provider]  albuterol (PROAIR HFA) 108 (90 BASE) MCG/ACT inhaler Inhale 2 puffs into the lungs every 6 (six) hours as needed. For shortness of breath    [provider]  albuterol  (PROVENTIL) (2.5 MG/3ML) 0.083% nebulizer solution Take 2.5 mg by nebulization every 4 (four) hours as needed. For shortness of breath  03/19/11   Parrett, Celicia S, NP  azithromycin (ZITHROMAX Z-PAK) 250 MG tablet Take 1 tablet (250 mg total) by mouth daily. 500mg  PO day 1, then 250mg  PO days 205 09/07/19   , MD  budesonide-formoterol [facility]) 160-4.5 MCG/ACT inhaler Inhale 2 puffs into the lungs 2 (two) times daily.    [provider]  clonazePAM (KLONOPIN) 0.5 MG tablet Take 0.5 mg by mouth 3 (three) times daily.     [provider]  doxycycline (VIBRAMYCIN) 100 MG capsule Take 1 capsule (100 mg total) by mouth 2 (two) times daily. 09/07/19   [facility], MD  fluticasone (FLONASE) 50 MCG/ACT nasal spray Place 2 sprays into both nostrils daily. 04/13/18   [provider]  HYDROcodone-acetaminophen (NORCO/VICODIN) 5-325 MG tablet Take 1-2 tablets by mouth every 6 (six) hours as needed. 02/09/19   04/15/18, MD  ibuprofen (ADVIL) 600 MG tablet Take 1 tablet (600 mg total) by mouth every 6 (six) hours as needed. 09/07/19   Terrilee Files, MD  lisinopril (PRINIVIL,ZESTRIL) 20 MG tablet Take 20 mg by mouth daily.    [provider]  LYRICA 100 MG capsule Take 100 mg by mouth 2 (two) times daily. 05/02/18   [provider]  metFORMIN (GLUCOPHAGE) 500 MG tablet Take 1 tablet by mouth 2 (two) times daily. 04/26/18   [provider]  ondansetron (ZOFRAN ODT) 4 MG disintegrating tablet Take 1 tablet (4 mg total) by mouth every 8 (eight) hours as needed for nausea or vomiting. 02/09/19   06/26/18, MD  OxyCODONE (OXYCONTIN) 10 mg T12A Take 1 tablet (10 mg total) by mouth every 12 (twelve) hours. 04/24/13   Terrilee Files, MD  oxyCODONE (ROXICODONE) 15 MG immediate release tablet Take 15 mg by mouth every 4 (four) hours.    [provider]  oxyCODONE-acetaminophen (PERCOCET/ROXICET) 5-325 MG tablet Take 1 tablet by mouth every  4 (four) hours as needed for severe pain. 05/10/18   Long, Kari Baars, MD  pantoprazole (PROTONIX) 40 MG tablet Take 40 mg by mouth daily.    [provider]  predniSONE (DELTASONE) 20 MG tablet Take 2 tablets (40 mg total) by mouth daily. 09/07/19   Arlyss Repress, MD  promethazine (PHENERGAN) 25 MG tablet Take 25 mg by mouth every 6 (six) hours as needed for nausea.     [provider]  sulfamethoxazole-trimethoprim (BACTRIM DS,SEPTRA DS) 800-160 MG tablet Take 1 tablet by mouth 2 (two) times daily. 02/23/17   Eber Hong, MD  SUMAtriptan (IMITREX) 50 MG tablet Take 50 mg by mouth daily as needed for migraine or headache.  08/28/16   [provider]  tiotropium (SPIRIVA) 18 MCG inhalation capsule Place 18 mcg into inhaler and inhale daily.    [provider]  ARIPiprazole (ABILIFY) 5 MG tablet Take 5 mg by mouth daily.    02/03/12  [provider]  venlafaxine (EFFEXOR) 75 MG tablet Take 75 mg by mouth daily.    02/03/12  [provider]    Allergies    Penicillins, Levaquin [levofloxacin in d5w], and Morphine  Review of Systems   Review of Systems  Constitutional: Positive for chills. Negative for fever.  HENT: Negative for congestion and sore throat.   Eyes: Negative.   Respiratory: Positive for cough, shortness of breath and wheezing. Negative for chest tightness.   Cardiovascular: Positive for chest pain and palpitations.  Gastrointestinal: Negative for abdominal pain, nausea and vomiting.  Genitourinary: Negative.   Musculoskeletal: Negative for arthralgias, joint swelling and neck pain.  Skin: Negative.  Negative for rash and wound.  Neurological: Negative for dizziness, weakness, light-headedness, numbness and headaches.  Psychiatric/Behavioral: Negative.     Physical Exam Updated Vital Signs BP 113/66 (BP Location: Right Arm)   Pulse (!) 112   Temp 100.3 F (37.9 C) (Oral)   Resp (!) 22   Ht 5\' 1"  (1.549 m)   Wt 58.5 kg    SpO2 94%   BMI 24.37 kg/m   Physical Exam Vitals and nursing note reviewed.  Constitutional:      Appearance: She is well-developed.  HENT:     Head: Normocephalic and atraumatic.  Eyes:     Conjunctiva/sclera: Conjunctivae normal.  Cardiovascular:     Rate and Rhythm: Normal rate and regular rhythm.     Heart sounds: Normal heart sounds.  Pulmonary:     Effort: Pulmonary effort is normal.     Breath sounds: Decreased breath sounds and wheezing present.     Comments: Bilateral rhonchi throughout lung fields.    Abdominal:     General: Bowel sounds are normal.     Palpations: Abdomen is soft.     Tenderness: There is no abdominal tenderness.  Musculoskeletal:        General: Normal range of motion.     Cervical back: Normal range of motion.  Skin:    General: Skin is warm and dry.  Neurological:     Mental Status: She is alert.     ED Results / Procedures / Treatments   Labs (all labs ordered are listed, but only abnormal results are displayed) Labs Reviewed  COMPREHENSIVE METABOLIC PANEL - Abnormal; Notable for the following components:      Result Value   Sodium 134 (*)    Chloride 83 (*)    CO2 36 (*)    Glucose, Bld 285 (*)    Creatinine, Ser 0.37 (*)    Albumin 3.3 (*)    AST 14 (*)    All other components within normal limits  CBC WITH DIFFERENTIAL/PLATELET - Abnormal; Notable for the following components:   WBC 19.2 (*)    Hemoglobin 11.8 (*)    Platelets 441 (*)    Neutro Abs 15.4 (*)    Monocytes Absolute 2.1 (*)    Abs Immature Granulocytes 0.09 (*)    All other components within normal limits  URINALYSIS, ROUTINE W REFLEX MICROSCOPIC - Abnormal; Notable for the following components:   Color, Urine AMBER (*)    Glucose, UA >=500 (*)    Hgb urine dipstick SMALL (*)    Bilirubin Urine SMALL (*)    Ketones, ur 20 (*)    Protein, ur 100 (*)    Bacteria, UA RARE (*)    All other  components within normal limits  BLOOD GAS, ARTERIAL - Abnormal;  Notable for the following components:   pH, Arterial 7.314 (*)    pCO2 arterial 85.8 (*)    Bicarbonate 36.9 (*)    Acid-Base Excess 15.5 (*)    All other components within normal limits  CULTURE, BLOOD (ROUTINE X 2)  CULTURE, BLOOD (ROUTINE X 2)  URINE CULTURE  RESPIRATORY PANEL BY RT PCR (FLU A&B, COVID)  LACTIC ACID, PLASMA  APTT  PROTIME-INR  LACTIC ACID, PLASMA  POC SARS CORONAVIRUS 2 AG -  ED  POC URINE PREG, ED    EKG EKG Interpretation  Date/Time:  Monday November 27 2019 10:14:05 EST Ventricular Rate:  117 PR Interval:    QRS Duration: 121 QT Interval:  326 QTC Calculation: 455 R Axis:   89 Text Interpretation: Sinus tachycardia Aberrant conduction of SV complex(es) Right bundle branch block ST elev, probable normal early repol pattern Baseline wander in lead(s) V3 Confirmed by Raeford RazorKohut, Stephen [id number]) on 11/27/2019 12:14:02 PM   Radiology DG Chest Port 1 View  Result Date: 11/27/2019 CLINICAL DATA:  Shortness of breath EXAM: PORTABLE CHEST 1 VIEW COMPARISON:  09/07/2019 FINDINGS: Chronic interstitial opacity but progressive reticulonodular density on both sides. Blunting of the right lateral costophrenic sulcus is mainly related to overlapping rib, no suspected true pleural effusion. Hyperinflation without pneumothorax. Normal heart size. IMPRESSION: Generalized acute on chronic airspace disease with acute findings presumably reflecting pneumonia. Electronically Signed   By: Marnee SpringJonathon  Watts M.D.   On: 11/27/2019 10:50    Procedures Procedures (including critical care time)  Medications Ordered in ED Medications  oxyCODONE-acetaminophen (PERCOCET/ROXICET) 5-325 MG per tablet 2 tablet (has no administration in time range)  ondansetron (ZOFRAN) injection 4 mg (has no administration in time range)  cefTRIAXone (ROCEPHIN) 1 g in sodium chloride 0.9 % 100 mL IVPB (0 g Intravenous Stopped 11/27/19 1202)  azithromycin (ZITHROMAX) 500 mg in sodium chloride 0.9 % 250 mL IVPB (0  mg Intravenous Stopped 11/27/19 1356)  ondansetron (ZOFRAN) injection 4 mg (4 mg Intravenous Given 11/27/19 1155)  albuterol (VENTOLIN HFA) 108 (90 Base) MCG/ACT inhaler 4 puff (4 puffs Inhalation Given 11/27/19 1348)  methylPREDNISolone sodium succinate (SOLU-MEDROL) 125 mg/2 mL injection 125 mg (125 mg Intravenous Given 11/27/19 1356)    ED Course  I have reviewed the triage vital signs and the nursing notes.  Pertinent labs & imaging results that were available during my care of the patient were reviewed by me and considered in my medical decision making (see chart for details).    MDM Rules/Calculators/A&P                      Pt with oxygen dependent COPD with acute exacerbation, low grade fever, cxr suggesting CAP.  She was given zithromax, rocephin. Rapid Covid test negative.  Pending confirmatory PCR.  Albuterol mdi dosing ordered for sx relief, solumedrol.   Comfortable with breathing with oxygen at 5L.  No accessory muscle use, continued expiratory wheeze.    Pt will require admission for further tx of sx.     CRITICAL CARE Performed by: Burgess AmorJulie Donnica Jarnagin Total critical care time: 35 minutes Critical care time was exclusive of separately billable procedures and treating other patients. Critical care was necessary to treat or prevent imminent or life-threatening deterioration. Critical care was time spent personally by me on the following activities: development of treatment plan with patient and/or surrogate as well as nursing, discussions with consultants, evaluation of patient's response  to treatment, examination of patient, obtaining history from patient or surrogate, ordering and performing treatments and interventions, ordering and review of laboratory studies, ordering and review of radiographic studies, pulse oximetry and re-evaluation of patient's condition.   Discussed with Dr. Arbutus Leas who accepts pt for admission.   Rapid turnaround  covid test requested and ordered.  Final Clinical  Impression(s) / ED Diagnoses Final diagnoses:  COPD exacerbation (HCC)  Community acquired pneumonia, unspecified laterality    Rx / DC Orders ED Discharge Orders    None       Victoriano Lain 11/27/19 1357    Burgess Amor, PA-C 11/27/19 1358    Raeford Razor, MD 12/01/19 1006

## 2019-11-27 NOTE — ED Notes (Signed)
purewick applied.

## 2019-11-27 NOTE — H&P (Signed)
History and Physical  ARNITA KOONS EAV:[id number] DOB: 09/24/1966 DOA: 11/27/2019   PCP: Patient, No Pcp Per   Patient coming from: Home  Chief Complaint: dyspnea, cough  HPI:  [name] is a 54 y.o. female with medical history of COPD, chronic respiratory failure on 3 L at home, hypertension, chronic pain syndrome, diabetes mellitus, anxiety presenting with 2 day history of shortness of breath, coughing with green sputum production, and nausea.  Patient also complains of epigastric and substernal chest pain worsening with coughing.  She denies any vomiting but has some nausea.  She denies any diarrhea, hematochezia, melena.  She has had subjective fevers and chills.  She denies any COVID-19 contacts.  She continues to smoke 1 pack/day.  She has had a 40-pack-year history.  She denies any illicit drug use. In the emergency department, the patient had low-grade temperature 100.3 F.  She was hemodynamically stable with oxygen saturation in the low 90s on her usual 3 L.  She was increased to 4 L with saturations up to 98%.  Chest x-ray showed chronic interstitial markings with progressive reticulonodular bilateral densities, right greater than left.  WBC was 19.2 with hemoglobin 11.8 and platelets 141,000.  Lactic acid 0.8.  BMP was essentially unremarkable as were her LFTs.  ABG showed 7.3 1/85/88/36 on 4 L.  The patient was started on Solu-Medrol, ceftriaxone, and azithromycin.  Assessment/Plan: Acute on chronic respiratory failure with hypoxia and hypercarbia -Secondary to pneumonia and COPD exacerbation -POC SARS-CoV2 is negative -I have requested SARS-CoV2 RT PCR be done -Check inflammatory markers pending Covid RT-PCR -Presently stable on 4 L -Wean oxygen as tolerated back to home demand to 2-3 L  COPD exacerbation -Continue IV steroids -Start Combivent -Start duo nebs if Covid RT PCR is negative.   Lobar pneumonia -Check procalcitonin -Start ceftriaxone and  azithromycin -Please note that blood cultures were obtained after antibiotics were given  Chest pain -Cycle troponins -EKG with sinus rhythm, rate bundle branch block  Chronic pain syndrome -PMP AWare queried--patient receives monthly pregabalin 100 mg, #90; clonazepam 0.5 mg #90; oxycodone 10 mg #120  Diabetes mellitus type 2, uncontrolled with hyperglycemia -Hemoglobin A1c -NovoLog sliding scale -Anticipate elevated CBGs secondary to steroids -Holding metformin  Essential hypertension -Holding lisinopril -Monitor BP clinically  Abdominal pain -CT abd/pelvis -check lipase  Tobacco abuse -I have discussed tobacco cessation with the patient.  I have counseled the patient regarding the negative impacts of continued tobacco use including but not limited to lung cancer, COPD, and cardiovascular disease.  I have discussed alternatives to tobacco and modalities that may help facilitate tobacco cessation including but not limited to biofeedback, hypnosis, and medications.  Total time spent with tobacco counseling was 4 minutes.        Past Medical History:  Diagnosis Date  . Angina   . Anxiety state 10/15/2015  . ARDS (adult respiratory distress syndrome) [facility])    Jan 2011  . Asthma   . Chronic back pain   . COPD (chronic obstructive pulmonary disease) (HCC)   . Diabetes mellitus   . GERD (gastroesophageal reflux disease) 12/21/2012  . HTN (hypertension) 10/15/2015  . Hyperglycemia, drug-induced    steroid induced hyperglycemia  . Migraine headache   . On home O2   . Pneumonia   . Recurrent upper respiratory infection (URI)   . Shortness of breath    Past Surgical History:  Procedure Laterality Date  . c-section    . TRACHEOSTOMY  decannulated 12/2009  . TUBAL LIGATION    . uterine ablasion     Social History:  reports that she has been smoking cigarettes. She has a 33.00 pack-year smoking history. She has never used smokeless tobacco. She reports that she does  not drink alcohol or use drugs.   Family History  Problem Relation Age of Onset  . Coronary artery disease Brother   . Diabetes Other   . Cancer Other   . Hypertension Other      Allergies  Allergen Reactions  . Penicillins Other (See Comments)    convulsions  . Levaquin [Levofloxacin In D5w] Itching and Swelling  . Morphine Nausea Only     Prior to Admission medications   Medication Sig Start Date End Date Taking? Authorizing Provider  acetaminophen (TYLENOL) 500 MG tablet Take 500 mg by mouth every 6 (six) hours as needed for fever.     [provider]  albuterol (PROAIR HFA) 108 (90 BASE) MCG/ACT inhaler Inhale 2 puffs into the lungs every 6 (six) hours as needed. For shortness of breath    [provider]  albuterol (PROVENTIL) (2.5 MG/3ML) 0.083% nebulizer solution Take 2.5 mg by nebulization every 4 (four) hours as needed. For shortness of breath  03/19/11   Parrett, Brooksie S, NP  azithromycin (ZITHROMAX Z-PAK) 250 MG tablet Take 1 tablet (250 mg total) by mouth daily. 500mg  PO day 1, then 250mg  PO days 205 09/07/19   Noemi Chapel, MD  budesonide-formoterol [facility]) 160-4.5 MCG/ACT inhaler Inhale 2 puffs into the lungs 2 (two) times daily.    [provider]  clonazePAM (KLONOPIN) 0.5 MG tablet Take 0.5 mg by mouth 3 (three) times daily.     [provider]  doxycycline (VIBRAMYCIN) 100 MG capsule Take 1 capsule (100 mg total) by mouth 2 (two) times daily. 09/07/19   Noemi Chapel, MD  fluticasone (FLONASE) 50 MCG/ACT nasal spray Place 2 sprays into both nostrils daily. 04/13/18   [provider]  HYDROcodone-acetaminophen (NORCO/VICODIN) 5-325 MG tablet Take 1-2 tablets by mouth every 6 (six) hours as needed. 02/09/19   Hayden Rasmussen, MD  ibuprofen (ADVIL) 600 MG tablet Take 1 tablet (600 mg total) by mouth every 6 (six) hours as needed. 09/07/19   Noemi Chapel, MD  lisinopril (PRINIVIL,ZESTRIL) 20 MG tablet Take 20 mg by mouth  daily.    [provider]  LYRICA 100 MG capsule Take 100 mg by mouth 2 (two) times daily. 05/02/18   [provider]  metFORMIN (GLUCOPHAGE) 500 MG tablet Take 1 tablet by mouth 2 (two) times daily. 04/26/18   [provider]  ondansetron (ZOFRAN ODT) 4 MG disintegrating tablet Take 1 tablet (4 mg total) by mouth every 8 (eight) hours as needed for nausea or vomiting. 02/09/19   Hayden Rasmussen, MD  OxyCODONE (OXYCONTIN) 10 mg T12A Take 1 tablet (10 mg total) by mouth every 12 (twelve) hours. 04/24/13   Sinda Du, MD  oxyCODONE (ROXICODONE) 15 MG immediate release tablet Take 15 mg by mouth every 4 (four) hours.    [provider]  oxyCODONE-acetaminophen (PERCOCET/ROXICET) 5-325 MG tablet Take 1 tablet by mouth every 4 (four) hours as needed for severe pain. 05/10/18   Long, Wonda Olds, MD  pantoprazole (PROTONIX) 40 MG tablet Take 40 mg by mouth daily.    [provider]  predniSONE (DELTASONE) 20 MG tablet Take 2 tablets (40 mg total) by mouth daily. 09/07/19   Noemi Chapel, MD  promethazine [facility])  25 MG tablet Take 25 mg by mouth every 6 (six) hours as needed for nausea.     [provider]  sulfamethoxazole-trimethoprim (BACTRIM DS,SEPTRA DS) 800-160 MG tablet Take 1 tablet by mouth 2 (two) times daily. 02/23/17   Kari Baars, MD  SUMAtriptan (IMITREX) 50 MG tablet Take 50 mg by mouth daily as needed for migraine or headache.  08/28/16   [provider]  tiotropium (SPIRIVA) 18 MCG inhalation capsule Place 18 mcg into inhaler and inhale daily.    [provider]  ARIPiprazole (ABILIFY) 5 MG tablet Take 5 mg by mouth daily.    02/03/12  [provider]  venlafaxine (EFFEXOR) 75 MG tablet Take 75 mg by mouth daily.    02/03/12  [provider]    Review of Systems:  Constitutional:  No weight loss, night sweats No headache.  No vision loss.  No eye pain or scotoma ENT:  No Difficulty  swallowing,Tooth/dental problems,Sore throat,  No ear ache, post nasal drip,  Cardio-vascular:  NoOrthopnea, PND, swelling in lower extremities,  dizziness, palpitations  GI:  No  vomiting, diarrhea, loss of appetite, hematochezia, melena, heartburn, indigestion, Resp:  No shortness of breath with exertion or at rest. No cough. No coughing up of blood .No wheezing.No chest wall deformity  Skin:  no rash or lesions.  GU:  no dysuria, change in color of urine, no urgency or frequency. No flank pain.  Musculoskeletal:  No joint pain or swelling. No decreased range of motion. No back pain.  Psych:  No change in mood or affect. No depression or anxiety. Neurologic: No headache, no dysesthesia, no focal weakness, no vision loss. No syncope  Physical Exam: Vitals:   11/27/19 1100 11/27/19 1158 11/27/19 1200 11/27/19 1348  BP: 104/73  113/66   Pulse: (!) 114  (!) 112   Resp:   (!) 22   Temp:      TempSrc:      SpO2: 92%  94% 94%  Weight:  58.5 kg    Height:  5\' 1"  (1.549 m)     General:  A&O x 3, NAD, nontoxic, pleasant/cooperative Head/Eye: No conjunctival hemorrhage, no icterus, [place]/AT, No nystagmus ENT:  No icterus,  No thrush, good dentition, no pharyngeal exudate Neck:  No masses, no lymphadenpathy, no bruits CV:  RRR, no rub, no gallop, no S3 Lung:  Bilateral rales. Bilateral exp wheeze Abdomen: soft/NT, +BS, nondistended, no peritoneal signs Ext: No cyanosis, No rashes, No petechiae, No lymphangitis, No edema Neuro: CNII-XII intact, strength 4/5 in bilateral upper and lower extremities, no dysmetria  Labs on Admission:  Basic Metabolic Panel: Recent Labs  Lab 11/27/19 1209  NA 134*  K 3.5  CL 83*  CO2 36*  GLUCOSE 285*  BUN 13  CREATININE 0.37*  CALCIUM 9.1   Liver Function Tests: Recent Labs  Lab 11/27/19 1209  AST 14*  ALT 12  ALKPHOS 100  BILITOT 0.7  PROT 7.8  ALBUMIN 3.3*   No results for input(s): LIPASE, AMYLASE in the last 168 hours. No results  for input(s): AMMONIA in the last 168 hours. CBC: Recent Labs  Lab 11/27/19 1209  WBC 19.2*  NEUTROABS 15.4*  HGB 11.8*  HCT 37.6  MCV 88.5  PLT 441*   Coagulation Profile: Recent Labs  Lab 11/27/19 1209  INR 1.0   Cardiac Enzymes: No results for input(s): CKTOTAL, CKMB, CKMBINDEX, TROPONINI in the last 168 hours. BNP: Invalid input(s): POCBNP CBG: No results for input(s): GLUCAP in  the last 168 hours. Urine analysis:    Component Value Date/Time   COLORURINE AMBER (A) 11/27/2019 1231   APPEARANCEUR CLEAR 11/27/2019 1231   LABSPEC 1.029 11/27/2019 1231   PHURINE 5.0 11/27/2019 1231   GLUCOSEU >=500 (A) 11/27/2019 1231   HGBUR SMALL (A) 11/27/2019 1231   BILIRUBINUR SMALL (A) 11/27/2019 1231   KETONESUR 20 (A) 11/27/2019 1231   PROTEINUR 100 (A) 11/27/2019 1231   UROBILINOGEN 1.0 07/24/2014 1605   NITRITE NEGATIVE 11/27/2019 1231   LEUKOCYTESUR NEGATIVE 11/27/2019 1231   Sepsis Labs: @LABRCNTIP (procalcitonin:4,lacticidven:4) )No results found for this or any previous visit (from the past 240 hour(s)).   Radiological Exams on Admission: DG Chest Port 1 View  Result Date: 11/27/2019 CLINICAL DATA:  Shortness of breath EXAM: PORTABLE CHEST 1 VIEW COMPARISON:  09/07/2019 FINDINGS: Chronic interstitial opacity but progressive reticulonodular density on both sides. Blunting of the right lateral costophrenic sulcus is mainly related to overlapping rib, no suspected true pleural effusion. Hyperinflation without pneumothorax. Normal heart size. IMPRESSION: Generalized acute on chronic airspace disease with acute findings presumably reflecting pneumonia. Electronically Signed   By: 09/09/2019 M.D.   On: 11/27/2019 10:50    EKG: Independently reviewed. Sinus tachycardia, RBBB    Time spent:60 minutes Code Status:   FULL Family Communication:  No Family at bedside Disposition Plan: expect 2-3 day hospitalization Consults called: none DVT Prophylaxis: [place]  Lovenox  01/25/2020, DO  [facility] Pager [phone]  If 7PM-7AM, please contact night-coverage [email or web address] Password [facility] 11/27/2019, 2:23 PM

## 2019-11-27 NOTE — Progress Notes (Signed)
SARS-CoV2 RT PCR--neg -d/c combivent -d/c dulera -start duonebs -start pulmicort  D-dimer 1.64 -given her dyspnea, hypoxia, and substernal chest pain, order CTA chest  DTat

## 2019-11-27 NOTE — ED Triage Notes (Signed)
Pt states she has been coughing up green mucus, feeling increasingly sob since yesterday. Has been using albuterol inhaler without relief. Pt is normally on 3L oxygen at home.

## 2019-11-27 NOTE — ED Notes (Signed)
Date and time results received: 11/27/19 12:18 PM  (use smartphrase ".now" to insert current time)  Test: pCO2 Critical Value: 85.8  Name of Provider Notified: Idol, PA  Orders Received? Or Actions Taken?: Orders Received - See Orders for details

## 2019-11-28 DIAGNOSIS — J189 Pneumonia, unspecified organism: Secondary | ICD-10-CM

## 2019-11-28 DIAGNOSIS — J441 Chronic obstructive pulmonary disease with (acute) exacerbation: Secondary | ICD-10-CM

## 2019-11-28 LAB — COMPREHENSIVE METABOLIC PANEL
ALT: 10 U/L (ref 0–44)
AST: 10 U/L — ABNORMAL LOW (ref 15–41)
Albumin: 2.8 g/dL — ABNORMAL LOW (ref 3.5–5.0)
Alkaline Phosphatase: 83 U/L (ref 38–126)
Anion gap: 11 (ref 5–15)
BUN: 13 mg/dL (ref 6–20)
CO2: 38 mmol/L — ABNORMAL HIGH (ref 22–32)
Calcium: 9.1 mg/dL (ref 8.9–10.3)
Chloride: 91 mmol/L — ABNORMAL LOW (ref 98–111)
Creatinine, Ser: 0.3 mg/dL — ABNORMAL LOW (ref 0.44–1.00)
GFR calc Af Amer: >60 mL/min (ref >60)
GFR calc non Af Amer: >60 mL/min (ref >60)
Glucose, Bld: 254 mg/dL — ABNORMAL HIGH (ref 70–99)
Potassium: 3.8 mmol/L (ref 3.5–5.1)
Sodium: 140 mmol/L (ref 135–145)
Total Bilirubin: 0.4 mg/dL (ref 0.3–1.2)
Total Protein: 6.9 g/dL (ref 6.5–8.1)

## 2019-11-28 LAB — GLUCOSE, CAPILLARY
Glucose-Capillary: 238 mg/dL — ABNORMAL HIGH (ref 70–99)
Glucose-Capillary: 291 mg/dL — ABNORMAL HIGH (ref 70–99)
Glucose-Capillary: 283 mg/dL — ABNORMAL HIGH (ref 70–99)
Glucose-Capillary: 265 mg/dL — ABNORMAL HIGH (ref 70–99)

## 2019-11-28 LAB — CBC
HCT: 36.4 % (ref 36.0–46.0)
Hemoglobin: 11.3 g/dL — ABNORMAL LOW (ref 12.0–15.0)
MCH: 27.7 pg (ref 26.0–34.0)
MCHC: 31 g/dL (ref 30.0–36.0)
MCV: 89.2 fL (ref 80.0–100.0)
Platelets: 410 10*3/uL — ABNORMAL HIGH (ref 150–400)
RBC: 4.08 MIL/uL (ref 3.87–5.11)
RDW: 12 % (ref 11.5–15.5)
WBC: 10.5 10*3/uL (ref 4.0–10.5)
nRBC: 0 % (ref 0.0–0.2)

## 2019-11-28 LAB — HIV ANTIBODY (ROUTINE TESTING W REFLEX): HIV Screen 4th Generation wRfx: NONREACTIVE

## 2019-11-28 LAB — URINE CULTURE: Culture: NO GROWTH

## 2019-11-28 LAB — HEMOGLOBIN A1C
Hgb A1c MFr Bld: 7.5 % — ABNORMAL HIGH (ref 4.8–5.6)
Mean Plasma Glucose: 169 mg/dL

## 2019-11-28 MED ORDER — IPRATROPIUM-ALBUTEROL 0.5-2.5 (3) MG/3ML IN SOLN
3.0000 mL | Freq: Three times a day (TID) | RESPIRATORY_TRACT | Status: DC
  Administered 2019-11-28 – 2019-11-29 (×3): 3 mL via RESPIRATORY_TRACT
  Filled 2019-11-28 (×4): qty 3

## 2019-11-28 MED ORDER — ORAL CARE MOUTH RINSE: 15.0000 mL | Freq: Two times a day (BID) | OROMUCOSAL | Status: DC

## 2019-11-28 MED ORDER — CHLORHEXIDINE GLUCONATE 0.12 % MT SOLN
15.0000 mL | Freq: Two times a day (BID) | OROMUCOSAL | Status: DC
  Administered 2019-11-28 – 2019-11-29 (×2): 15 mL via OROMUCOSAL
  Filled 2019-11-28 (×2): qty 15

## 2019-11-28 NOTE — Progress Notes (Signed)
Inpatient Diabetes Program Recommendations  AACE/ADA: New Consensus Statement on Inpatient Glycemic Control  Target Ranges:  Prepandial:   less than 140 mg/dL      Peak postprandial:   less than 180 mg/dL (1-2 hours)      Critically ill patients:  140 - 180 mg/dL  Results for [name], [name] (MRN [id number]) as of 11/28/2019 08:26  Ref. Range 11/27/2019 21:43 11/28/2019 07:24  Glucose-Capillary Latest Ref Range: 70 - 99 mg/dL 003 (H) 491 (H)    Review of Glycemic Control  Diabetes history: DM2 Outpatient Diabetes medications: Metformin 500 mg BID Current orders for Inpatient glycemic control: Novolog 0-15 units TID with meals, Novolog 0-5 units QHS; Solumedrol 60 mg Q8H  Inpatient Diabetes Program Recommendations:   Insulin - Basal: If steroids are continued, please consider ordering Lantus 6 units Q24H.  HgbA1C: A1C 7.4% on 11/27/19 indicating an average glucose of 166 mg/dl over the past 2-3 months.  Thanks, Joyce Penner, RN, MSN, CDE Diabetes Coordinator Inpatient Diabetes Program [phone] (Team Pager from 8am to 5pm)

## 2019-11-28 NOTE — Progress Notes (Signed)
PROGRESS NOTE    [name]  JQB:[id number] DOB: 1966/01/13 DOA: 11/27/2019 PCP: Patient, No Pcp Per    Brief Narrative:  54 year old female with history of COPD on home oxygen, chronic pain syndrome, diabetes, admitted to the hospital with shortness of breath and cough.  Found to have pneumonia with COPD exacerbation.   Assessment & Plan:   Active Problems:   COPD with acute exacerbation (HCC)   Acute on chronic respiratory failure with hypoxia and hypercapnia (HCC)   Chronic pain syndrome   Lobar pneumonia (HCC)   1. Acute on chronic respiratory failure with hypoxia and hypercarbia.  COVID-19 test is negative.  Secondary to pneumonia and COPD exacerbation.  Continue to wean down oxygen back to her home demand of 2 to 3 L 2. COPD exacerbation.  Continue on duo nebs and intravenous steroids. 3. Lobar pneumonia.  Continue on intravenous antibiotics.  Slowly improving. 4. Chronic pain syndrome.  Continue on home dose of Lyrica, Klonopin and oxycodone 5. Diabetes.  Continue on sliding scale insulin.  Hold Metformin.  Blood sugar stable. 6. Hypertension.  Holding lisinopril.  Blood pressure stable. 7. Abdominal pain.  Appears to have clinically resolved.  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  Will check sonogram for further evaluation.  Lipase negative.   DVT prophylaxis: Lovenox Code Status: Full code Family Communication: Discussed with husband at the bedside Disposition Plan: Discharge home once respiratory status has improved   Consultants:     Procedures:     Antimicrobials:   Ceftriaxone 1/4 >  Azithromycin 1/4 >   Subjective: Still short of breath, mildly better than yesterday, still wheezing and coughing.  Objective: Vitals:   11/28/19 0800 11/28/19 0840 11/28/19 1438 11/28/19 1928  BP:      Pulse:    98  Resp:    18  Temp:      TempSrc:      SpO2: 95% 95% 96% 96%  Weight:      Height:        Intake/Output Summary (Last  24 hours) at 11/28/2019 2014 Last data filed at 11/28/2019 1700 Gross per 24 hour  Intake 1070 ml  Output 4 ml  Net 1066 ml   Filed Weights   11/27/19 1000 11/27/19 1158 11/27/19 2029  Weight: 60 kg 58.5 kg 60.5 kg    Examination:  General exam: Appears calm and comfortable  Respiratory system: Bilateral wheezes and rhonchi. Respiratory effort normal. Cardiovascular system: S1 & S2 heard, RRR. No JVD, murmurs, rubs, gallops or clicks. No pedal edema. Gastrointestinal system: Abdomen is nondistended, soft and nontender. No organomegaly or masses felt. Normal bowel sounds heard. Central nervous system: Alert and oriented. No focal neurological deficits. Extremities: Symmetric 5 x 5 power. Skin: No rashes, lesions or ulcers Psychiatry: Judgement and insight appear normal. Mood & affect appropriate.   Data Reviewed: I have personally reviewed following labs and imaging studies  CBC: Recent Labs  Lab 11/27/19 1209 11/28/19 0550  WBC 19.2* 10.5  NEUTROABS 15.4*  --   HGB 11.8* 11.3*  HCT 37.6 36.4  MCV 88.5 89.2  PLT 441* 410*   Basic Metabolic Panel: Recent Labs  Lab 11/27/19 1209 11/28/19 0550  NA 134* 140  K 3.5 3.8  CL 83* 91*  CO2 36* 38*  GLUCOSE 285* 254*  BUN 13 13  CREATININE 0.37* 0.30*  CALCIUM 9.1 9.1   GFR: Estimated Creatinine Clearance: 69.5 mL/min (A) (by C-G formula based on SCr of 0.3 mg/dL (  L)). Liver Function Tests: Recent Labs  Lab 11/27/19 1209 11/28/19 0550  AST 14* 10*  ALT 12 10  ALKPHOS 100 83  BILITOT 0.7 0.4  PROT 7.8 6.9  ALBUMIN 3.3* 2.8*   Recent Labs  Lab 11/27/19 1536  LIPASE 14   No results for input(s): AMMONIA in the last 168 hours. Coagulation Profile: Recent Labs  Lab 11/27/19 1209  INR 1.0   Cardiac Enzymes: No results for input(s): CKTOTAL, CKMB, CKMBINDEX, TROPONINI in the last 168 hours. BNP (last 3 results) No results for input(s): PROBNP in the last 8760 hours. HbA1C: Recent Labs    11/27/19 1838    HGBA1C 7.5*   CBG: Recent Labs  Lab 11/27/19 2143 11/28/19 0724 11/28/19 1107 11/28/19 1627  GLUCAP 404* 238* 291* 283*   Lipid Profile: No results for input(s): CHOL, HDL, LDLCALC, TRIG, CHOLHDL, LDLDIRECT in the last 72 hours. Thyroid Function Tests: No results for input(s): TSH, T4TOTAL, FREET4, T3FREE, THYROIDAB in the last 72 hours. Anemia Panel: Recent Labs    11/27/19 1536  FERRITIN 392*   Sepsis Labs: Recent Labs  Lab 11/27/19 1209 11/27/19 1536  PROCALCITON  --  0.21  LATICACIDVEN 0.8 1.0    Recent Results (from the past 240 hour(s))  Blood Culture (routine x 2)     Status: None (Preliminary result)   Collection Time: 11/27/19 12:20 PM   Specimen: BLOOD RIGHT HAND  Result Value Ref Range Status   Specimen Description   Final    BLOOD RIGHT HAND BOTTLES DRAWN AEROBIC AND ANAEROBIC   Special Requests Blood Culture adequate volume  Final   Culture   Final    NO GROWTH < 24 HOURS Performed at [facility], [address] [place], [place] [address]    Report Status PENDING  Incomplete  Blood Culture (routine x 2)     Status: None (Preliminary result)   Collection Time: 11/27/19 12:20 PM   Specimen: Right Antecubital; Blood  Result Value Ref Range Status   Specimen Description   Final    RIGHT ANTECUBITAL BOTTLES DRAWN AEROBIC AND ANAEROBIC   Special Requests Blood Culture adequate volume  Final   Culture   Final    NO GROWTH < 24 HOURS Performed at [facility], [address], [place], [place] [address]    Report Status PENDING  Incomplete  Urine culture     Status: None   Collection Time: 11/27/19 12:31 PM   Specimen: In/Out Cath Urine  Result Value Ref Range Status   Specimen Description   Final    IN/OUT CATH URINE Performed at [facility], [address], [place], [place] [address]    Special Requests   Final    NONE Performed at [facility], [address], [place], [place] [address]    Culture   Final    NO GROWTH Performed at [facility], [address], [place], [place] [address]    Report Status 11/28/2019 FINAL  Final  Respiratory Panel by RT PCR (Flu A&B, Covid) - Nasopharyngeal Swab     Status: None   Collection Time: 11/27/19  2:17 PM   Specimen: Nasopharyngeal Swab  Result Value Ref Range Status   SARS Coronavirus 2 by RT PCR NEGATIVE NEGATIVE Final    Comment: (NOTE) SARS-CoV-2 target nucleic acids are NOT DETECTED. The SARS-CoV-2 RNA is generally detectable in upper respiratoy specimens during the acute phase of infection. The lowest concentration of SARS-CoV-2 viral copies this assay can detect is  131 copies/mL. A negative result does not preclude SARS-Cov-2 infection and should not be used as the sole basis for treatment or other patient management decisions. A negative result may occur with  improper specimen collection/handling, submission of specimen other than nasopharyngeal swab, presence of viral mutation(s) within the areas targeted by this assay, and inadequate number of viral copies (<131 copies/mL). A negative result must be combined with clinical observations, patient history, and epidemiological information. The expected result is Negative. Fact Sheet for Patients:  PinkCheek.be Fact Sheet for Healthcare Providers:  GravelBags.it This test is not yet ap proved or cleared by the [place] FDA and  has been authorized for detection and/or diagnosis of SARS-CoV-2 by FDA under an Emergency Use Authorization (EUA). This EUA will remain  in effect (meaning this test can be used) for the duration of the COVID-19 declaration under Section 564(b)(1) of the Act, 21 U.S.C. section 360bbb-3(b)(1), unless the authorization is terminated or revoked sooner.    Influenza A by PCR NEGATIVE NEGATIVE Final   Influenza B by PCR NEGATIVE NEGATIVE Final    Comment: (NOTE) The Xpert Xpress SARS-CoV-2/FLU/RSV assay is intended as an aid in   the diagnosis of influenza from Nasopharyngeal swab specimens and  should not be used as a sole basis for treatment. Nasal washings and  aspirates are unacceptable for Xpert Xpress SARS-CoV-2/FLU/RSV  testing. Fact Sheet for Patients: PinkCheek.be Fact Sheet for Healthcare Providers: GravelBags.it This test is not yet approved or cleared by the [place] FDA and  has been authorized for detection and/or diagnosis of SARS-CoV-2 by  FDA under an Emergency Use Authorization (EUA). This EUA will remain  in effect (meaning this test can be used) for the duration of the  Covid-19 declaration under Section 564(b)(1) of the Act, 21  U.S.C. section 360bbb-3(b)(1), unless the authorization is  terminated or revoked. Performed at [facility], [address], [place], [place] [address]          Radiology Studies: CT ANGIO CHEST PE W OR WO CONTRAST  Result Date: 11/27/2019 CLINICAL DATA:  Shortness of breath. Chest pain. Elevated D-dimer. EXAM: CT ANGIOGRAPHY CHEST WITH CONTRAST TECHNIQUE: Multidetector CT imaging of the chest was performed using the standard protocol during bolus administration of intravenous contrast. Multiplanar CT image reconstructions and MIPs were obtained to evaluate the vascular anatomy. CONTRAST:  127mL OMNIPAQUE IOHEXOL 350 MG/ML SOLN COMPARISON:  September 28, 2016 FINDINGS: Cardiovascular: Contrast injection is sufficient to demonstrate satisfactory opacification of the pulmonary arteries to the segmental level. There is no pulmonary embolus. The main pulmonary artery is within normal limits for size. There is no CT evidence of acute right heart strain. The visualized aorta is normal. Heart size is normal, without pericardial effusion. Mediastinum/Nodes: --mediastinal and hilar adenopathy is noted. --No axillary lymphadenopathy. --No supraclavicular lymphadenopathy. --Normal thyroid gland. --The esophagus is  unremarkable Lungs/Pleura: There is scattered bilateral tree-in-bud opacities bilaterally with more focal areas of consolidation in the lung bases. There are few nodular opacities bilaterally. Emphysematous changes are noted. There is no significant pleural effusion. The trachea is unremarkable. Upper Abdomen: There is dilatation of the partially visualized common bile duct. This is relatively similar when compared to prior CT abdomen in 2017. Musculoskeletal: No chest wall abnormality. No acute or significant osseous findings. Review of the MIP images confirms the above findings. IMPRESSION: 1. There is no evidence for acute pulmonary embolus. 2. Bilateral tree-in-bud opacities with more focal areas of consolidation in the lung bases. This may represent an  infectious or inflammatory process. These findings were present on the patient's 2017 CT of the chest but have progressed since that study. 3. Mediastinal and hilar adenopathy, likely reactive. Electronically Signed   By: Katherine Mantlehristopher  Green M.D.   On: 11/27/2019 18:44   CT ABDOMEN PELVIS W CONTRAST  Result Date: 11/27/2019 CLINICAL DATA:  Abdominal abscess and infection is suspected. EXAM: CT ABDOMEN AND PELVIS WITH CONTRAST TECHNIQUE: Multidetector CT imaging of the abdomen and pelvis was performed using the standard protocol following bolus administration of intravenous contrast. CONTRAST:  100mL OMNIPAQUE IOHEXOL 350 MG/ML SOLN COMPARISON:  05/30/2016, CT angio chest of the same date. FINDINGS: Lower chest: Diffuse nodular pattern at the lung bases appears worse when compared to the prior study., based on comparison with abdominal imaging of 2017. Please refer to CT of the chest acquired concurrently, for additional information. Hepatobiliary: Signs of biliary ductal distension without change since 2017. Less distention of the gallbladder with mild thickening of the gallbladder fundus. This area measures approximately 5 mm in greatest thickness. Pancreas:  Normal appearance of the pancreas. Spleen: Spleen is unremarkable. Adrenals/Urinary Tract: Adrenal glands are normal. Signs of symmetric renal enhancement. Low-density left renal lesion likely a small cyst in the lower pole also small presumed cyst in the upper pole. Stomach/Bowel: Signs of malrotation. Duodenum does not cross midline. Configuration is unchanged as compared with the study of 2017. Cecum is in the right lower quadrant. The appendix is normal. Vascular/Lymphatic: SMV and SMA relationship is normal. Scattered atherosclerosis. No signs of lymphadenopathy in the upper abdomen or retroperitoneum. No signs of pelvic lymphadenopathy. Reproductive: Uterus and adnexa are normal by CT. Urinary bladder is normal. Other: No sign of free air or focal fluid collection. Musculoskeletal: No sign of acute bone finding or evidence of destructive bone process. IMPRESSION: 1. No acute findings in the abdomen or pelvis. 2. Diffuse nodular pattern at the lung bases appears worse when compared to the prior study. Please refer to CT of the chest acquired concurrently, for additional information. Findings may represent sequela of chronic and or recurrent infection, atypical infection should be considered. Given chronicity and worsening follow-up with pulmonology may be of benefit if not yet performed. 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 4. Thickening of the gallbladder fundus is indeterminate, potentially representing adenomyomatosis,, consider follow-up sonogram for further assessment upon follow-up to exclude gallbladder neoplasm though this is felt less likely. 5. Aortic atherosclerosis. Aortic Atherosclerosis (ICD10-I70.0). Electronically Signed   By: Donzetta KohutGeoffrey  Wile M.D.   On: 11/27/2019 18:57   DG Chest Port 1 View  Result Date: 11/27/2019 CLINICAL DATA:  Shortness of breath EXAM: PORTABLE CHEST 1 VIEW COMPARISON:  09/07/2019 FINDINGS: Chronic interstitial opacity but  progressive reticulonodular density on both sides. Blunting of the right lateral costophrenic sulcus is mainly related to overlapping rib, no suspected true pleural effusion. Hyperinflation without pneumothorax. Normal heart size. IMPRESSION: Generalized acute on chronic airspace disease with acute findings presumably reflecting pneumonia. Electronically Signed   By: Marnee SpringJonathon  Watts M.D.   On: 11/27/2019 10:50        Scheduled Meds: . budesonide (PULMICORT) nebulizer solution  0.5 mg Nebulization BID  . chlorhexidine  15 mL Mouth Rinse BID  . clonazePAM  0.5 mg Oral TID  . enoxaparin (LOVENOX) injection  40 mg Subcutaneous Q24H  . fluticasone  2 spray Each Nare Daily  . influenza vac split quadrivalent PF  0.5 mL Intramuscular Tomorrow-1000  . insulin aspart  0-15 Units  Subcutaneous TID WC  . insulin aspart  0-5 Units Subcutaneous QHS  . ipratropium-albuterol  3 mL Nebulization TID  . mouth rinse  15 mL Mouth Rinse q12n4p  . methylPREDNISolone (SOLU-MEDROL) injection  60 mg Intravenous Q8H  . ondansetron (ZOFRAN) IV  4 mg Intravenous Q6H  . pantoprazole  40 mg Oral BID AC  . pregabalin  100 mg Oral BID   Continuous Infusions: . azithromycin 500 mg (11/28/19 1124)  . cefTRIAXone (ROCEPHIN)  IV 1 g (11/28/19 1032)     LOS: 1 day    Time spent:    Erick Blinks, MD [facility]   If 7PM-7AM, please contact night-coverage [email or web address]  11/28/2019, 8:14 PM

## 2019-11-29 ENCOUNTER — Inpatient Hospital Stay (HOSPITAL_COMMUNITY): Payer: Medicaid Other

## 2019-11-29 LAB — BASIC METABOLIC PANEL
Anion gap: 13 (ref 5–15)
BUN: 18 mg/dL (ref 6–20)
CO2: 38 mmol/L — ABNORMAL HIGH (ref 22–32)
Calcium: 9.3 mg/dL (ref 8.9–10.3)
Chloride: 88 mmol/L — ABNORMAL LOW (ref 98–111)
Creatinine, Ser: 0.49 mg/dL (ref 0.44–1.00)
GFR calc Af Amer: >60 mL/min (ref >60)
GFR calc non Af Amer: >60 mL/min (ref >60)
Glucose, Bld: 314 mg/dL — ABNORMAL HIGH (ref 70–99)
Potassium: 3.9 mmol/L (ref 3.5–5.1)
Sodium: 139 mmol/L (ref 135–145)

## 2019-11-29 LAB — CBC
HCT: 36.5 % (ref 36.0–46.0)
Hemoglobin: 11.4 g/dL — ABNORMAL LOW (ref 12.0–15.0)
MCH: 27.9 pg (ref 26.0–34.0)
MCHC: 31.2 g/dL (ref 30.0–36.0)
MCV: 89.5 fL (ref 80.0–100.0)
Platelets: 479 10*3/uL — ABNORMAL HIGH (ref 150–400)
RBC: 4.08 MIL/uL (ref 3.87–5.11)
RDW: 12 % (ref 11.5–15.5)
WBC: 22.5 10*3/uL — ABNORMAL HIGH (ref 4.0–10.5)
nRBC: 0 % (ref 0.0–0.2)

## 2019-11-29 LAB — GLUCOSE, CAPILLARY
Glucose-Capillary: 314 mg/dL — ABNORMAL HIGH (ref 70–99)
Glucose-Capillary: 320 mg/dL — ABNORMAL HIGH (ref 70–99)
Glucose-Capillary: 207 mg/dL — ABNORMAL HIGH (ref 70–99)

## 2019-11-29 MED ORDER — AZITHROMYCIN 250 MG PO TABS: 250.0000 mg | ORAL_TABLET | Freq: Every day | ORAL | 0 refills | Status: DC

## 2019-11-29 MED ORDER — DOXYCYCLINE HYCLATE 100 MG PO CAPS: 100.0000 mg | ORAL_CAPSULE | Freq: Two times a day (BID) | ORAL | 0 refills | Status: DC

## 2019-11-29 MED ORDER — GUAIFENESIN ER 600 MG PO TB12: 600.0000 mg | ORAL_TABLET | Freq: Two times a day (BID) | ORAL | 2 refills | Status: AC

## 2019-11-29 MED ORDER — INSULIN GLARGINE 100 UNITS/ML SOLOSTAR PEN: 15.0000 [IU] | PEN_INJECTOR | Freq: Every day | SUBCUTANEOUS | 11 refills | Status: DC

## 2019-11-29 MED ORDER — CEFUROXIME AXETIL 500 MG PO TABS: 250.0000 mg | ORAL_TABLET | Freq: Two times a day (BID) | ORAL | 0 refills | Status: AC

## 2019-11-29 MED ORDER — ALBUTEROL SULFATE HFA 108 (90 BASE) MCG/ACT IN AERS: 2.0000 | INHALATION_SPRAY | Freq: Four times a day (QID) | RESPIRATORY_TRACT | 0 refills | Status: DC | PRN

## 2019-11-29 MED ORDER — METHYLPREDNISOLONE SODIUM SUCC 40 MG IJ SOLR
40.0000 mg | Freq: Two times a day (BID) | INTRAMUSCULAR | Status: DC
  Filled 2019-11-29: qty 1

## 2019-11-29 MED ORDER — INSULIN PEN NEEDLE 31G X 5 MM MISC: 0 refills | Status: DC

## 2019-11-29 MED ORDER — PREDNISONE 10 MG PO TABS: ORAL_TABLET | ORAL | 0 refills | Status: DC

## 2019-11-29 NOTE — Discharge Summary (Signed)
Physician Discharge Summary  [name]:[id number] DOB: 15-Jul-1966 DOA: 11/27/2019  PCP: Patient, No Pcp Per  Admit date: 11/27/2019 Discharge date: 11/29/2019  Admitted From: Home Disposition: Home  Recommendations for Outpatient Follow-up:  1. Follow up with PCP in 1-2 weeks 2. Please obtain BMP/CBC in one week  Discharge Condition: Stable CODE STATUS: Full code Diet recommendation: Heart healthy, carb modified  Brief/Interim Summary: 54 year old female with history of COPD on home oxygen, chronic pain syndrome, diabetes, admitted to the hospital with shortness of breath and cough.  Found to have pneumonia with COPD exacerbation.  Discharge Diagnoses:  Active Problems:   COPD with acute exacerbation (HCC)   Acute on chronic respiratory failure with hypoxia and hypercapnia (HCC)   Chronic pain syndrome   Lobar pneumonia (HCC)  1. Acute on chronic respiratory failure with hypoxia and hypercarbia.  COVID-19 test is negative.  Secondary to pneumonia and COPD exacerbation.    She has been weaned back down to her home demand of 2 L. 2. COPD exacerbation.    Clinically she is improved.  Continue on nebulizer treatments at home.  Transition Solu-Medrol to prednisone taper.. 3. Lobar pneumonia.    Treated with ceftriaxone and azithromycin.  Transition to oral antibiotics on discharge 4. Chronic pain syndrome.  Continue on home dose of Lyrica, Klonopin and oxycodone 5. Diabetes.    Blood sugars are elevated in the setting of steroid use.  Resume Metformin on discharge.  Will discharge with Lantus as well. 6. Hypertension.    Resume lisinopril on discharge 7. Abdominal pain.  Appears to have clinically resolved.  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  Abdominal sonogram shows dilated CBD without any obvious stone.  She may have passed a stone recently.  Clinically, she does not have any abdominal pain, nausea or vomiting.  If symptoms recur, can consider further  work-up with MRCP.  Lipase negative.  Discharge Instructions  Discharge Instructions    Diet - low sodium heart healthy   Complete by: As directed    Increase activity slowly   Complete by: As directed      Allergies as of 11/29/2019      Reactions   Penicillins Other (See Comments)   convulsions   Levaquin [levofloxacin In D5w] Itching, Swelling   Morphine Nausea Only      Medication List    STOP taking these medications   doxycycline 100 MG capsule Commonly known as: VIBRAMYCIN   HYDROcodone-acetaminophen 5-325 MG tablet Commonly known as: NORCO/VICODIN   oxyCODONE-acetaminophen 5-325 MG tablet Commonly known as: PERCOCET/ROXICET   sulfamethoxazole-trimethoprim 800-160 MG tablet Commonly known as: BACTRIM DS     TAKE these medications   acetaminophen 500 MG tablet Commonly known as: TYLENOL Take 500 mg by mouth every 6 (six) hours as needed for fever.   albuterol (2.5 MG/3ML) 0.083% nebulizer solution Commonly known as: PROVENTIL Take 2.5 mg by nebulization every 4 (four) hours as needed. For shortness of breath   albuterol 108 (90 Base) MCG/ACT inhaler Commonly known as: ProAir HFA Inhale 2 puffs into the lungs every 6 (six) hours as needed. For shortness of breath   azithromycin 250 MG tablet Commonly known as: Zithromax Z-Pak Take 1 tablet (250 mg total) by mouth daily. 500mg  PO day 1, then 250mg  PO days 205   budesonide-formoterol 160-4.5 MCG/ACT inhaler Commonly known as: SYMBICORT Inhale 2 puffs into the lungs 2 (two) times daily.   cefUROXime 500 MG tablet Commonly known as: CEFTIN Take 0.5  tablets (250 mg total) by mouth 2 (two) times daily for 10 days.   clonazePAM 0.5 MG tablet Commonly known as: KLONOPIN Take 0.5 mg by mouth 3 (three) times daily.   fluticasone 50 MCG/ACT nasal spray Commonly known as: FLONASE Place 2 sprays into both nostrils daily.   guaiFENesin 600 MG 12 hr tablet Commonly known as: Mucinex Take 1 tablet (600 mg  total) by mouth 2 (two) times daily.   ibuprofen 600 MG tablet Commonly known as: ADVIL Take 1 tablet (600 mg total) by mouth every 6 (six) hours as needed.   insulin glargine 100 unit/mL Sopn Commonly known as: LANTUS Inject 0.15 mLs (15 Units total) into the skin daily.   Insulin Pen Needle 31G X 5 MM Misc Use as directed   lisinopril 20 MG tablet Commonly known as: ZESTRIL Take 20 mg by mouth daily.   Lyrica 100 MG capsule Generic drug: pregabalin Take 100 mg by mouth 2 (two) times daily.   metFORMIN 500 MG tablet Commonly known as: GLUCOPHAGE Take 1 tablet by mouth 2 (two) times daily.   ondansetron 4 MG disintegrating tablet Commonly known as: Zofran ODT Take 1 tablet (4 mg total) by mouth every 8 (eight) hours as needed for nausea or vomiting.   oxyCODONE 10 mg 12 hr tablet Commonly known as: OXYCONTIN Take 1 tablet (10 mg total) by mouth every 12 (twelve) hours.   pantoprazole 40 MG tablet Commonly known as: PROTONIX Take 40 mg by mouth daily.   predniSONE 10 MG tablet Commonly known as: DELTASONE Take 40mg  po daily for 2 days then 30mg  daily for 2 days then 20mg  daily for 2 days then 10mg  daily for 2 days then stop What changed:   medication strength  how much to take  how to take this  when to take this  additional instructions   promethazine 25 MG tablet Commonly known as: PHENERGAN Take 25 mg by mouth every 6 (six) hours as needed for nausea.   SUMAtriptan 50 MG tablet Commonly known as: IMITREX Take 50 mg by mouth daily as needed for migraine or headache.   tiotropium 18 MCG inhalation capsule Commonly known as: SPIRIVA Place 18 mcg into inhaler and inhale daily.      Follow-up Information    Corum, , MD Follow up.   Specialty: Family Medicine Contact information: [address] [place] [phone]          Allergies  Allergen Reactions  . Penicillins Other (See Comments)    convulsions  . Levaquin  [Levofloxacin In D5w] Itching and Swelling  . Morphine Nausea Only    Consultations:     Procedures/Studies: CT ANGIO CHEST PE W OR WO CONTRAST  Result Date: 11/27/2019 CLINICAL DATA:  Shortness of breath. Chest pain. Elevated D-dimer. EXAM: CT ANGIOGRAPHY CHEST WITH CONTRAST TECHNIQUE: Multidetector CT imaging of the chest was performed using the standard protocol during bolus administration of intravenous contrast. Multiplanar CT image reconstructions and MIPs were obtained to evaluate the vascular anatomy. CONTRAST:  [place] OMNIPAQUE IOHEXOL 350 MG/ML SOLN COMPARISON:  September 28, 2016 FINDINGS: Cardiovascular: Contrast injection is sufficient to demonstrate satisfactory opacification of the pulmonary arteries to the segmental level. There is no pulmonary embolus. The main pulmonary artery is within normal limits for size. There is no CT evidence of acute right heart strain. The visualized aorta is normal. Heart size is normal, without pericardial effusion. Mediastinum/Nodes: --mediastinal and hilar adenopathy is noted. --No axillary lymphadenopathy. --No supraclavicular lymphadenopathy. --  Normal thyroid gland. --The esophagus is unremarkable Lungs/Pleura: There is scattered bilateral tree-in-bud opacities bilaterally with more focal areas of consolidation in the lung bases. There are few nodular opacities bilaterally. Emphysematous changes are noted. There is no significant pleural effusion. The trachea is unremarkable. Upper Abdomen: There is dilatation of the partially visualized common bile duct. This is relatively similar when compared to prior CT abdomen in 2017. Musculoskeletal: No chest wall abnormality. No acute or significant osseous findings. Review of the MIP images confirms the above findings. IMPRESSION: 1. There is no evidence for acute pulmonary embolus. 2. Bilateral tree-in-bud opacities with more focal areas of consolidation in the lung bases. This may represent an infectious or  inflammatory process. These findings were present on the patient's 2017 CT of the chest but have progressed since that study. 3. Mediastinal and hilar adenopathy, likely reactive. Electronically Signed   By: Katherine Mantle M.D.   On: 11/27/2019 18:44   CT ABDOMEN PELVIS W CONTRAST  Result Date: 11/27/2019 CLINICAL DATA:  Abdominal abscess and infection is suspected. EXAM: CT ABDOMEN AND PELVIS WITH CONTRAST TECHNIQUE: Multidetector CT imaging of the abdomen and pelvis was performed using the standard protocol following bolus administration of intravenous contrast. CONTRAST:  OMNIPAQUE IOHEXOL 350 MG/ML SOLN COMPARISON:  05/30/2016, CT angio chest of the same date. FINDINGS: Lower chest: Diffuse nodular pattern at the lung bases appears worse when compared to the prior study., based on comparison with abdominal imaging of 2017. Please refer to CT of the chest acquired concurrently, for additional information. Hepatobiliary: Signs of biliary ductal distension without change since 2017. Less distention of the gallbladder with mild thickening of the gallbladder fundus. This area measures approximately 5 mm in greatest thickness. Pancreas: Normal appearance of the pancreas. Spleen: Spleen is unremarkable. Adrenals/Urinary Tract: Adrenal glands are normal. Signs of symmetric renal enhancement. Low-density left renal lesion likely a small cyst in the lower pole also small presumed cyst in the upper pole. Stomach/Bowel: Signs of malrotation. Duodenum does not cross midline. Configuration is unchanged as compared with the study of 2017. Cecum is in the right lower quadrant. The appendix is normal. Vascular/Lymphatic: SMV and SMA relationship is normal. Scattered atherosclerosis. No signs of lymphadenopathy in the upper abdomen or retroperitoneum. No signs of pelvic lymphadenopathy. Reproductive: Uterus and adnexa are normal by CT. Urinary bladder is normal. Other: No sign of free air or focal fluid collection.  Musculoskeletal: No sign of acute bone finding or evidence of destructive bone process. IMPRESSION: 1. No acute findings in the abdomen or pelvis. 2. Diffuse nodular pattern at the lung bases appears worse when compared to the prior study. Please refer to CT of the chest acquired concurrently, for additional information. Findings may represent sequela of chronic and or recurrent infection, atypical infection should be considered. Given chronicity and worsening follow-up with pulmonology may be of benefit if not yet performed. 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 4. Thickening of the gallbladder fundus is indeterminate, potentially representing adenomyomatosis,, consider follow-up sonogram for further assessment upon follow-up to exclude gallbladder neoplasm though this is felt less likely. 5. Aortic atherosclerosis. Aortic Atherosclerosis (ICD10-I70.0). Electronically Signed   By: Donzetta Kohut M.D.   On: 11/27/2019 18:57   DG Chest Port 1 View  Result Date: 11/27/2019 CLINICAL DATA:  Shortness of breath EXAM: PORTABLE CHEST 1 VIEW COMPARISON:  09/07/2019 FINDINGS: Chronic interstitial opacity but progressive reticulonodular density on both sides. Blunting of the right lateral costophrenic sulcus is  mainly related to overlapping rib, no suspected true pleural effusion. Hyperinflation without pneumothorax. Normal heart size. IMPRESSION: Generalized acute on chronic airspace disease with acute findings presumably reflecting pneumonia. Electronically Signed   By: Marnee SpringJonathon  Watts M.D.   On: 11/27/2019 10:50   US Abdomen Limited RUQ  Result Date: 11/29/2019 CLINICAL DATA:  Abdominal pain EXAM: ULTRASOUND ABDOMEN LIMITED RIGHT UPPER QUADRANT COMPARISON:  CT 11/27/2019 FINDINGS: Gallbladder: Multiple mobile gallstones, the largest 14 mm. No wall thickening or sonographic Murphy sign. Common bile duct: Diameter: Dilated, 10 mm. Distal duct cannot be visualized due to  overlying bowel gas. Liver: No focal lesion identified. Within normal limits in parenchymal echogenicity. Portal vein is patent on color Doppler imaging with normal direction of blood flow towards the liver. Other: None. IMPRESSION: Cholelithiasis.  No sonographic evidence of acute cholecystitis. Dilated common bile duct. This is stable since prior CT. This could be further evaluated with MRCP to exclude distal obstructing stone or mass as clinically indicated. Electronically Signed   By: Charlett NoseKevin  Dover M.D.   On: 11/29/2019 09:06       Subjective: Feels her breathing has improved.  Continues to have mild wheeze but feels that she is approaching baseline.  Discharge Exam: Vitals:   11/29/19 0738 11/29/19 0743 11/29/19 1354 11/29/19 1434  BP:   135/84   Pulse:   86   Resp:   17   Temp:   98.3 F (36.8 C)   TempSrc:   Oral   SpO2: 97% 97% 97% 96%  Weight:      Height:        General: Pt is alert, awake, not in acute distress Cardiovascular: RRR, S1/S2 +, no rubs, no gallops Respiratory: Mild wheeze bilaterally Abdominal: Soft, NT, ND, bowel sounds + Extremities: no edema, no cyanosis    The results of significant diagnostics from this hospitalization (including imaging, microbiology, ancillary and laboratory) are listed below for reference.     Microbiology: Recent Results (from the past 240 hour(s))  Blood Culture (routine x 2)     Status: None (Preliminary result)   Collection Time: 11/27/19 12:20 PM   Specimen: BLOOD RIGHT HAND  Result Value Ref Range Status   Specimen Description   Final    BLOOD RIGHT HAND BOTTLES DRAWN AEROBIC AND ANAEROBIC   Special Requests Blood Culture adequate volume  Final   Culture   Final    NO GROWTH 2 DAYS Performed at [facility], [address] [place], [place] [address]    Report Status PENDING  Incomplete  Blood Culture (routine x 2)     Status: None (Preliminary result)   Collection Time: 11/27/19 12:20 PM   Specimen: Right  Antecubital; Blood  Result Value Ref Range Status   Specimen Description   Final    RIGHT ANTECUBITAL BOTTLES DRAWN AEROBIC AND ANAEROBIC   Special Requests Blood Culture adequate volume  Final   Culture   Final    NO GROWTH 2 DAYS Performed at [facility], [address] [place], [place] [address]    Report Status PENDING  Incomplete  Urine culture     Status: None   Collection Time: 11/27/19 12:31 PM   Specimen: In/Out Cath Urine  Result Value Ref Range Status   Specimen Description   Final    IN/OUT CATH URINE Performed at [facility], [address] [place], [place] [address]    Special Requests   Final    NONE Performed at [facility], [address], [place], [place] [address]    Culture   Final    NO GROWTH Performed at [facility], [address] [place], [place] [address]    Report Status 11/28/2019 FINAL  Final  Respiratory Panel by RT PCR (Flu A&B, Covid) - Nasopharyngeal Swab     Status: None   Collection Time: 11/27/19  2:17 PM   Specimen: Nasopharyngeal Swab  Result Value Ref Range Status   SARS Coronavirus 2 by RT PCR NEGATIVE NEGATIVE Final    Comment: (NOTE) SARS-CoV-2 target nucleic acids are NOT DETECTED. The SARS-CoV-2 RNA is generally detectable in upper respiratoy specimens during the acute phase of infection. The lowest concentration of SARS-CoV-2 viral copies this assay can detect is 131 copies/mL. A negative result does not preclude SARS-Cov-2 infection and should not be used as the sole basis for treatment or other patient management decisions. A negative result may occur with  improper specimen collection/handling, submission of specimen other than nasopharyngeal swab, presence of viral mutation(s) within the areas targeted by this assay, and inadequate number of viral copies (<131 copies/mL). A negative result must be combined with clinical observations, patient history, and epidemiological information. The expected result is  Negative. Fact Sheet for Patients:  https://www.moore.com/ Fact Sheet for Healthcare Providers:  https://www.young.biz/ This test is not yet ap proved or cleared by the [place] FDA and  has been authorized for detection and/or diagnosis of SARS-CoV-2 by FDA under an Emergency Use Authorization (EUA). This EUA will remain  in effect (meaning this test can be used) for the duration of the COVID-19 declaration under Section 564(b)(1) of the Act, 21 U.S.C. section 360bbb-3(b)(1), unless the authorization is terminated or revoked sooner.    Influenza A by PCR NEGATIVE NEGATIVE Final   Influenza B by PCR NEGATIVE NEGATIVE Final    Comment: (NOTE) The Xpert Xpress SARS-CoV-2/FLU/RSV assay is intended as an aid in  the diagnosis of influenza from Nasopharyngeal swab specimens and  should not be used as a sole basis for treatment. Nasal washings and  aspirates are unacceptable for Xpert Xpress SARS-CoV-2/FLU/RSV  testing. Fact Sheet for Patients: https://www.moore.com/ Fact Sheet for Healthcare Providers: https://www.young.biz/ This test is not yet approved or cleared by the [place] FDA and  has been authorized for detection and/or diagnosis of SARS-CoV-2 by  FDA under an Emergency Use Authorization (EUA). This EUA will remain  in effect (meaning this test can be used) for the duration of the  Covid-19 declaration under Section 564(b)(1) of the Act, 21  U.S.C. section 360bbb-3(b)(1), unless the authorization is  terminated or revoked. Performed at [facility], [address], [place], [place] [address]      Labs: BNP (last 3 results) No results for input(s): BNP in the last 8760 hours. Basic Metabolic Panel: Recent Labs  Lab 11/27/19 1209 11/28/19 0550 11/29/19 0516  NA 134* 140 139  K 3.5 3.8 3.9  CL 83* 91* 88*  CO2 36* 38* 38*  GLUCOSE 285* 254* 314*  BUN 13 13 18   CREATININE 0.37*  0.30* 0.49  CALCIUM 9.1 9.1 9.3   Liver Function Tests: Recent Labs  Lab 11/27/19 1209 11/28/19 0550  AST 14* 10*  ALT 12 10  ALKPHOS 100 83  BILITOT 0.7 0.4  PROT 7.8 6.9  ALBUMIN 3.3* 2.8*   Recent Labs  Lab 11/27/19 1536  LIPASE 14   No results for input(s): AMMONIA in the last 168 hours. CBC: Recent Labs  Lab 11/27/19 1209 11/28/19 0550 11/29/19  0516  WBC 19.2* 10.5 22.5*  NEUTROABS 15.4*  --   --   HGB 11.8* 11.3* 11.4*  HCT 37.6 36.4 36.5  MCV 88.5 89.2 89.5  PLT 441* 410* 479*   Cardiac Enzymes: No results for input(s): CKTOTAL, CKMB, CKMBINDEX, TROPONINI in the last 168 hours. BNP: Invalid input(s): POCBNP CBG: Recent Labs  Lab 11/28/19 1627 11/28/19 2056 11/29/19 0726 11/29/19 1056 11/29/19 1615  GLUCAP 283* 265* 314* 320* 207*   D-Dimer Recent Labs    11/27/19 1536  DDIMER 1.64*   Hgb A1c Recent Labs    11/27/19 1838  HGBA1C 7.5*   Lipid Profile No results for input(s): CHOL, HDL, LDLCALC, TRIG, CHOLHDL, LDLDIRECT in the last 72 hours. Thyroid function studies No results for input(s): TSH, T4TOTAL, T3FREE, THYROIDAB in the last 72 hours.  Invalid input(s): FREET3 Anemia work up Recent Labs    11/27/19 1536  FERRITIN 392*   Urinalysis    Component Value Date/Time   COLORURINE AMBER (A) 11/27/2019 1231   APPEARANCEUR CLEAR 11/27/2019 1231   LABSPEC 1.029 11/27/2019 1231   PHURINE 5.0 11/27/2019 1231   GLUCOSEU >=500 (A) 11/27/2019 1231   HGBUR SMALL (A) 11/27/2019 1231   BILIRUBINUR SMALL (A) 11/27/2019 1231   KETONESUR 20 (A) 11/27/2019 1231   PROTEINUR 100 (A) 11/27/2019 1231   UROBILINOGEN 1.0 07/24/2014 1605   NITRITE NEGATIVE 11/27/2019 1231   LEUKOCYTESUR NEGATIVE 11/27/2019 1231   Sepsis Labs Invalid input(s): PROCALCITONIN,  WBC,  LACTICIDVEN Microbiology Recent Results (from the past 240 hour(s))  Blood Culture (routine x 2)     Status: None (Preliminary result)   Collection Time: 11/27/19 12:20 PM    Specimen: BLOOD RIGHT HAND  Result Value Ref Range Status   Specimen Description   Final    BLOOD RIGHT HAND BOTTLES DRAWN AEROBIC AND ANAEROBIC   Special Requests Blood Culture adequate volume  Final   Culture   Final    NO GROWTH 2 DAYS Performed at [facility], [address] [place], [place] [address]    Report Status PENDING  Incomplete  Blood Culture (routine x 2)     Status: None (Preliminary result)   Collection Time: 11/27/19 12:20 PM   Specimen: Right Antecubital; Blood  Result Value Ref Range Status   Specimen Description   Final    RIGHT ANTECUBITAL BOTTLES DRAWN AEROBIC AND ANAEROBIC   Special Requests Blood Culture adequate volume  Final   Culture   Final    NO GROWTH 2 DAYS Performed at [facility], [address] [place], [place] [address]    Report Status PENDING  Incomplete  Urine culture     Status: None   Collection Time: 11/27/19 12:31 PM   Specimen: In/Out Cath Urine  Result Value Ref Range Status   Specimen Description   Final    IN/OUT CATH URINE Performed at [facility], [address] [place], [place] [address]    Special Requests   Final    NONE Performed at [facility], [address] [place], [place] [address]    Culture   Final    NO GROWTH Performed at [facility], [address] [place], [place] [address]    Report Status 11/28/2019 FINAL  Final  Respiratory Panel by RT PCR (Flu A&B, Covid) - Nasopharyngeal Swab     Status: None   Collection Time: 11/27/19  2:17 PM   Specimen: Nasopharyngeal Swab  Result Value Ref Range Status   SARS Coronavirus 2  by RT PCR NEGATIVE NEGATIVE Final    Comment: (NOTE) SARS-CoV-2 target nucleic acids are NOT DETECTED. The SARS-CoV-2 RNA is generally detectable in upper respiratoy specimens during the acute phase of infection. The lowest concentration of SARS-CoV-2 viral copies this assay can detect is 131 copies/mL. A negative result does not preclude SARS-Cov-2 infection and should not be  used as the sole basis for treatment or other patient management decisions. A negative result may occur with  improper specimen collection/handling, submission of specimen other than nasopharyngeal swab, presence of viral mutation(s) within the areas targeted by this assay, and inadequate number of viral copies (<131 copies/mL). A negative result must be combined with clinical observations, patient history, and epidemiological information. The expected result is Negative. Fact Sheet for Patients:  PinkCheek.be Fact Sheet for Healthcare Providers:  GravelBags.it This test is not yet ap proved or cleared by the [place] FDA and  has been authorized for detection and/or diagnosis of SARS-CoV-2 by FDA under an Emergency Use Authorization (EUA). This EUA will remain  in effect (meaning this test can be used) for the duration of the COVID-19 declaration under Section 564(b)(1) of the Act, 21 U.S.C. section 360bbb-3(b)(1), unless the authorization is terminated or revoked sooner.    Influenza A by PCR NEGATIVE NEGATIVE Final   Influenza B by PCR NEGATIVE NEGATIVE Final    Comment: (NOTE) The Xpert Xpress SARS-CoV-2/FLU/RSV assay is intended as an aid in  the diagnosis of influenza from Nasopharyngeal swab specimens and  should not be used as a sole basis for treatment. Nasal washings and  aspirates are unacceptable for Xpert Xpress SARS-CoV-2/FLU/RSV  testing. Fact Sheet for Patients: PinkCheek.be Fact Sheet for Healthcare Providers: GravelBags.it This test is not yet approved or cleared by the [place] FDA and  has been authorized for detection and/or diagnosis of SARS-CoV-2 by  FDA under an Emergency Use Authorization (EUA). This EUA will remain  in effect (meaning this test can be used) for the duration of the  Covid-19 declaration under Section 564(b)(1) of the  Act, 21  U.S.C. section 360bbb-3(b)(1), unless the authorization is  terminated or revoked. Performed at [facility], [address], [place], [place] [address]      Time coordinating discharge: 23mins  SIGNED:   Kathie Dike, MD  [facility] 11/29/2019, 8:55 PM   If 7PM-7AM, please contact night-coverage [email or web address]

## 2019-11-29 NOTE — Plan of Care (Signed)

## 2019-11-29 NOTE — Progress Notes (Signed)
Nsg Discharge Note  Admit Date:  11/27/2019 Discharge date: 11/29/2019   [name] to be D/C'd home per MD order.  AVS completed.  Copy for chart, and copy for patient signed, and dated. Patient/caregiver able to verbalize understanding.  Discharge Medication: Allergies as of 11/29/2019      Reactions   Penicillins Other (See Comments)   convulsions   Levaquin [levofloxacin In D5w] Itching, Swelling   Morphine Nausea Only      Medication List    STOP taking these medications   doxycycline 100 MG capsule Commonly known as: VIBRAMYCIN   HYDROcodone-acetaminophen 5-325 MG tablet Commonly known as: NORCO/VICODIN   oxyCODONE-acetaminophen 5-325 MG tablet Commonly known as: PERCOCET/ROXICET   sulfamethoxazole-trimethoprim 800-160 MG tablet Commonly known as: BACTRIM DS     TAKE these medications   acetaminophen 500 MG tablet Commonly known as: TYLENOL Take 500 mg by mouth every 6 (six) hours as needed for fever.   albuterol (2.5 MG/3ML) 0.083% nebulizer solution Commonly known as: PROVENTIL Take 2.5 mg by nebulization every 4 (four) hours as needed. For shortness of breath   albuterol 108 (90 Base) MCG/ACT inhaler Commonly known as: ProAir HFA Inhale 2 puffs into the lungs every 6 (six) hours as needed. For shortness of breath   azithromycin 250 MG tablet Commonly known as: Zithromax Z-Pak Take 1 tablet (250 mg total) by mouth daily. 500mg  PO day 1, then 250mg  PO days 205   budesonide-formoterol 160-4.5 MCG/ACT inhaler Commonly known as: SYMBICORT Inhale 2 puffs into the lungs 2 (two) times daily.   cefUROXime 500 MG tablet Commonly known as: CEFTIN Take 0.5 tablets (250 mg total) by mouth 2 (two) times daily for 10 days.   clonazePAM 0.5 MG tablet Commonly known as: KLONOPIN Take 0.5 mg by mouth 3 (three) times daily.   fluticasone 50 MCG/ACT nasal spray Commonly known as: FLONASE Place 2 sprays into both nostrils daily.   guaiFENesin 600 MG 12 hr  tablet Commonly known as: Mucinex Take 1 tablet (600 mg total) by mouth 2 (two) times daily.   ibuprofen 600 MG tablet Commonly known as: ADVIL Take 1 tablet (600 mg total) by mouth every 6 (six) hours as needed.   insulin glargine 100 unit/mL Sopn Commonly known as: LANTUS Inject 0.15 mLs (15 Units total) into the skin daily.   Insulin Pen Needle 31G X 5 MM Misc Use as directed   lisinopril 20 MG tablet Commonly known as: ZESTRIL Take 20 mg by mouth daily.   Lyrica 100 MG capsule Generic drug: pregabalin Take 100 mg by mouth 2 (two) times daily.   metFORMIN 500 MG tablet Commonly known as: GLUCOPHAGE Take 1 tablet by mouth 2 (two) times daily.   ondansetron 4 MG disintegrating tablet Commonly known as: Zofran ODT Take 1 tablet (4 mg total) by mouth every 8 (eight) hours as needed for nausea or vomiting.   oxyCODONE 10 mg 12 hr tablet Commonly known as: OXYCONTIN Take 1 tablet (10 mg total) by mouth every 12 (twelve) hours.   pantoprazole 40 MG tablet Commonly known as: PROTONIX Take 40 mg by mouth daily.   predniSONE 10 MG tablet Commonly known as: DELTASONE Take 40mg  po daily for 2 days then 30mg  daily for 2 days then 20mg  daily for 2 days then 10mg  daily for 2 days then stop What changed:   medication strength  how much to take  how to take this  when to take this  additional instructions   promethazine 25  MG tablet Commonly known as: PHENERGAN Take 25 mg by mouth every 6 (six) hours as needed for nausea.   SUMAtriptan 50 MG tablet Commonly known as: IMITREX Take 50 mg by mouth daily as needed for migraine or headache.   tiotropium 18 MCG inhalation capsule Commonly known as: SPIRIVA Place 18 mcg into inhaler and inhale daily.       Discharge Assessment: Vitals:   11/29/19 1354 11/29/19 1434  BP: 135/84   Pulse: 86   Resp: 17   Temp: 98.3 F (36.8 C)   SpO2: 97% 96%   Skin clean, dry and intact without evidence of skin break down, no  evidence of skin tears noted. IV catheter discontinued intact. Site without signs and symptoms of complications - no redness or edema noted at insertion site, patient denies c/o pain - only slight tenderness at site.  Dressing with slight pressure applied.  D/c Instructions-Education: Discharge instructions given to patient/family with verbalized understanding. D/c education completed with patient/family including follow up instructions, medication list, d/c activities limitations if indicated, with other d/c instructions as indicated by MD - patient able to verbalize understanding, all questions fully answered. Patient instructed to return to ED, call 911, or call MD for any changes in condition.  Patient escorted via WC, and D/C home via private auto.  Carole Civil, RN 11/29/2019 6:11 PM

## 2019-11-29 NOTE — Progress Notes (Signed)
Inpatient Diabetes Program Recommendations  AACE/ADA: New Consensus Statement on Inpatient Glycemic Control   Target Ranges:  Prepandial:   less than 140 mg/dL      Peak postprandial:   less than 180 mg/dL (1-2 hours)      Critically ill patients:  140 - 180 mg/dL   Results for [name], Joyce A (MRN [id number]) as of 11/29/2019 11:05  Ref. Range 11/28/2019 07:24 11/28/2019 11:07 11/28/2019 16:27 11/28/2019 20:56 11/29/2019 07:26 11/29/2019 10:56  Glucose-Capillary Latest Ref Range: 70 - 99 mg/dL 334 (H) 356 (H) 861 (H) 265 (H) 314 (H) 320 (H)   Review of Glycemic Control  Diabetes history: DM2 Outpatient Diabetes medications: Metformin 500 mg BID Current orders for Inpatient glycemic control: Novolog 0-15 units TID with meals, Novolog 0-5 units QHS; Solumedrol 40 mg Q12H  Inpatient Diabetes Program Recommendations:   Insulin - Basal: If steroids are continued, please consider ordering Lantus 10 units Q24H.  Insulin-Meal Coverage: If steroids are continued, please consider ordering Novolog 3 units TID with meals for meal coverage if patient eats at least 50% of meals.   HgbA1C: A1C 7.4% on 11/27/19 indicating an average glucose of 166 mg/dl over the past 2-3 months.  Thanks, Orlando Penner, RN, MSN, CDE Diabetes Coordinator Inpatient Diabetes Program [phone] (Team Pager from 8am to 5pm)

## 2019-12-02 ENCOUNTER — Other Ambulatory Visit: Payer: Self-pay | Admitting: Family Medicine

## 2019-12-02 LAB — CULTURE, BLOOD (ROUTINE X 2)
Culture: NO GROWTH
Culture: NO GROWTH
Special Requests: ADEQUATE
Special Requests: ADEQUATE

## 2019-12-02 MED ORDER — INSULIN GLARGINE 100 UNIT/ML SOLOSTAR PEN: 15.0000 [IU] | PEN_INJECTOR | Freq: Every day | SUBCUTANEOUS | 2 refills | Status: DC

## 2019-12-02 NOTE — Progress Notes (Signed)
Pt called, [facility] to [facility].    Standley Dakins MD How to contact the [facility] Attending or Consulting provider 7A - 7P or covering provider during after hours 7P -7A, for this patient?  1. Check the care team in [facility] and look for a) attending/consulting [facility] provider listed and b) the [facility] team listed 2. Log into [email or web address] and use [place]'s universal password to access. If you do not have the password, please contact the hospital operator. 3. Locate the [facility] provider you are looking for under [facility] and page to a number that you can be directly reached. 4. If you still have difficulty reaching the provider, please page the [facility] (Director on Call) for the Hospitalists listed on amion for assistance.

## 2019-12-04 ENCOUNTER — Telehealth: Payer: Self-pay | Admitting: Family Medicine

## 2019-12-04 NOTE — Telephone Encounter (Signed)
Use one of the new patient slots 1/19

## 2019-12-04 NOTE — Telephone Encounter (Signed)
Patient is calling and she was a patient of Dr. Juanetta Gosling, she states she was just released from the hospital and is needing a hospital follow up. Where would you like for me to place her. She has not been seen as a new patient here yet.

## 2019-12-04 NOTE — Telephone Encounter (Signed)
Pt is scheduled for 2pm on 1/19

## 2019-12-12 ENCOUNTER — Ambulatory Visit: Payer: Medicaid Other | Admitting: Family Medicine

## 2020-01-04 ENCOUNTER — Ambulatory Visit (INDEPENDENT_AMBULATORY_CARE_PROVIDER_SITE_OTHER): Payer: Medicaid Other | Admitting: Family Medicine

## 2020-01-04 ENCOUNTER — Encounter: Payer: Self-pay | Admitting: Family Medicine

## 2020-01-04 ENCOUNTER — Other Ambulatory Visit: Payer: Self-pay

## 2020-01-04 VITALS — BP 136/85 | HR 95 | Temp 98.6°F | Ht 61.0 in | Wt 136.4 lb

## 2020-01-04 DIAGNOSIS — E1165 Type 2 diabetes mellitus with hyperglycemia: Secondary | ICD-10-CM | POA: Diagnosis not present

## 2020-01-04 DIAGNOSIS — J439 Emphysema, unspecified: Secondary | ICD-10-CM

## 2020-01-04 DIAGNOSIS — F411 Generalized anxiety disorder: Secondary | ICD-10-CM

## 2020-01-04 DIAGNOSIS — G894 Chronic pain syndrome: Secondary | ICD-10-CM

## 2020-01-04 DIAGNOSIS — I10 Essential (primary) hypertension: Secondary | ICD-10-CM

## 2020-01-04 DIAGNOSIS — R499 Unspecified voice and resonance disorder: Secondary | ICD-10-CM

## 2020-01-04 DIAGNOSIS — K219 Gastro-esophageal reflux disease without esophagitis: Secondary | ICD-10-CM

## 2020-01-04 DIAGNOSIS — R112 Nausea with vomiting, unspecified: Secondary | ICD-10-CM

## 2020-01-04 DIAGNOSIS — F17200 Nicotine dependence, unspecified, uncomplicated: Secondary | ICD-10-CM

## 2020-01-04 MED ORDER — GLUCOSE BLOOD VI STRP: 1.0000 | ORAL_STRIP | Freq: Three times a day (TID) | 2 refills | Status: DC

## 2020-01-04 MED ORDER — BUSPIRONE HCL 5 MG PO TABS: 5.0000 mg | ORAL_TABLET | Freq: Two times a day (BID) | ORAL | 1 refills | Status: DC

## 2020-01-04 MED ORDER — ALBUTEROL SULFATE HFA 108 (90 BASE) MCG/ACT IN AERS: 2.0000 | INHALATION_SPRAY | Freq: Four times a day (QID) | RESPIRATORY_TRACT | 0 refills | Status: DC | PRN

## 2020-01-04 MED ORDER — NYSTATIN 100000 UNIT/ML MT SUSP: 5.0000 mL | Freq: Four times a day (QID) | OROMUCOSAL | 0 refills | Status: DC

## 2020-01-04 MED ORDER — PROMETHAZINE HCL 25 MG PO TABS: 25.0000 mg | ORAL_TABLET | Freq: Four times a day (QID) | ORAL | 1 refills | Status: DC | PRN

## 2020-01-04 MED ORDER — METFORMIN HCL 500 MG PO TABS: 1000.0000 mg | ORAL_TABLET | Freq: Two times a day (BID) | ORAL | 1 refills | Status: DC

## 2020-01-04 NOTE — Progress Notes (Signed)
New Patient Office Visit  Subjective:  Patient ID: [name], female    DOB: May 15, 1966  Age: 54 y.o. MRN: [id number]  CC:  Chief Complaint  Patient presents with  . Establish Care  . Hospitalization Follow-up    11/2019  . Medication Refill  . COPD  . Sore Throat  . Otalgia  Active Problems:   COPD with acute exacerbation (HCC)   Acute on chronic respiratory failure with hypoxia and hypercapnia (HCC)   Chronic pain syndrome   Lobar pneumonia (HCC) Acute on chronic respiratory failure with hypoxia and hypercarbia. COVID-19 test is negative. Secondary to pneumonia and COPD exacerbation.   She has been weaned back down to her home demand of 2 L. 1. COPD exacerbation.   Clinically she is improved.  Continue on nebulizer treatments at home.  Transition Solu-Medrol to prednisone taper.. 2. Lobar pneumonia.   Treated with ceftriaxone and azithromycin.  Transition to oral antibiotics on discharge 3. Chronic pain syndrome. Continue on home dose of Lyrica, Klonopin and oxycodone 4. Diabetes.   Blood sugars are elevated in the setting of steroid use.  Resume Metformin on discharge.  Will discharge with Lantus as well. 5. Hypertension.   Resume lisinopril on discharge 6. Abdominal pain. Appears to have clinically resolved. 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  Abdominal sonogram shows dilated CBD without any obvious stone.  She may have passed a stone recently.  Clinically, she does not have any abdominal pain, nausea or vomiting.  If symptoms recur, can consider further work-up with MRCP. Lipase negative.   HPI [place] A Kibler presents for COPD-MDI QID, nebulizer several times a week, symbicort BID  Completed zithromax-1 month ago, Flonase daily, uses Spiriva every morning Mucinex twice a day 3 L O2 on concentrator at home  DM-Lantus 15units, metformin 500mg  BID Glucose 170-198  [facility]-managing Lyrica/Narcotics-no stimulators or  injections-diagnosis back pain, RLS, sciatic nerve  Anxiety-took Klonopin in the past-Buspar-start 5mg  BID  HTN-lisinopril daily-no cardiac history  GERD-protonix, no EGD, gallstones noted on admission  Migraine headaches-imitrex -2x week max  2013-cryo ablation-no periods since the procedure  Past Medical History:  Diagnosis Date  . Angina   . Anxiety state 10/15/2015  . ARDS (adult respiratory distress syndrome) [facility])    Jan 2011  . Asthma   . Chronic back pain   . COPD (chronic obstructive pulmonary disease) (HCC)   . Diabetes mellitus   . GERD (gastroesophageal reflux disease) 12/21/2012  . HTN (hypertension) 10/15/2015  . Hyperglycemia, drug-induced    steroid induced hyperglycemia  . Migraine headache   . On home O2   . Pneumonia   . Recurrent upper respiratory infection (URI)   . Shortness of breath     Past Surgical History:  Procedure Laterality Date  . c-section    . TRACHEOSTOMY     decannulated 12/2009  . TUBAL LIGATION    . uterine ablasion      Family History  Problem Relation Age of Onset  . Coronary artery disease Brother   . Diabetes Other   . Cancer Other   . Hypertension Other     Social History   Socioeconomic History  . Marital status: Single    Spouse name: Not on file  . Number of children: Not on file  . Years of education: Not on file  . Highest education level: Not on file  Occupational History  . Not on file  Tobacco Use  . Smoking status:  Current Every Day Smoker    Packs/day: 1.00    Years: 33.00    Pack years: 33.00    Types: Cigarettes  . Smokeless tobacco: Never Used  . Tobacco comment: 1/2 pack a day  since age 54  Substance and Sexual Activity  . Alcohol use: No  . Drug use: No    Comment: Formerly used cocaine , narcotics, THC  . Sexual activity: Yes    Birth control/protection: Other-see comments  Other Topics Concern  . Not on file  Social History Narrative   Living with her mom.    Social Determinants of  Health   Financial Resource Strain:   . Difficulty of Paying Living Expenses: Not on file  Food Insecurity:   . Worried About Programme researcher, broadcasting/film/video in the Last Year: Not on file  . Ran Out of Food in the Last Year: Not on file  Transportation Needs:   . Lack of Transportation (Medical): Not on file  . Lack of Transportation (Non-Medical): Not on file  Physical Activity:   . Days of Exercise per Week: Not on file  . Minutes of Exercise per Session: Not on file  Stress:   . Feeling of Stress : Not on file  Social Connections:   . Frequency of Communication with Friends and Family: Not on file  . Frequency of Social Gatherings with Friends and Family: Not on file  . Attends Religious Services: Not on file  . Active Member of Clubs or Organizations: Not on file  . Attends Banker Meetings: Not on file  . Marital Status: Not on file  Intimate Partner Violence:   . Fear of Current or Ex-Partner: Not on file  . Emotionally Abused: Not on file  . Physically Abused: Not on file  . Sexually Abused: Not on file    ROS Review of Systems  HENT: Positive for congestion, sinus pressure, sore throat and voice change.   Eyes: Positive for itching.  Respiratory: Positive for cough.   Gastrointestinal: Positive for nausea and vomiting.  Musculoskeletal: Positive for back pain and neck stiffness.  Neurological: Positive for headaches.    Objective:   Today's Vitals: BP 136/85 (BP Location: Left Arm, Patient Position: Sitting, Cuff Size: Normal)   Pulse 95   Temp 98.6 F (37 C) (Oral)   Ht 5\' 1"  (1.549 m)   Wt 136 lb 6.4 oz (61.9 kg)   SpO2 97%   BMI 25.77 kg/m   Physical Exam Vitals reviewed.  Constitutional:      Appearance: She is well-developed.  HENT:     Head: Normocephalic and atraumatic.     Comments: White coating on tongue, none on mucosa of the mouth Eyes:     Conjunctiva/sclera: Conjunctivae normal.  Cardiovascular:     Rate and Rhythm: Normal rate and  regular rhythm.  Pulmonary:     Effort: Pulmonary effort is normal.     Breath sounds: Normal breath sounds.  Musculoskeletal:     Cervical back: Normal range of motion and neck supple.  Neurological:     General: No focal deficit present.     Mental Status: She is alert.  Psychiatric:        Mood and Affect: Mood normal.        Behavior: Behavior normal.     Assessment & Plan:   1. Pulmonary emphysema, unspecified emphysema type (HCC)cxr recheck Pt needs assistance in regulating oxygen - Ambulatory referral to Pulmonology - CBC w/Diff/Platelet - DG  Chest 2 View; Future Albuterol/symbicort/ 2. Type 2 diabetes mellitus with hyperglycemia, without long-term current use of insulin (HCC) Glucophage/spiriva-recent admission-stablized - Ambulatory referral to Endocrinology - Ambulatory referral to diabetic education - COMPLETE METABOLIC PANEL WITH GFR V4Q  7.5%-1/21 3. Gastroesophageal reflux disease, unspecified whether esophagitis present protonic-nausea/vomiting - CBC w/Diff/Platelet  4. Essential hypertension Lisinopril-136/85 -stable  5. Chronic pain syndrome Sees pain clinic-pt understands primary care clinic can not give narotics imitrex for migraine headaches 6. TOBACCO ABUSE Encourage to quit  7. Anxiety state D/w pt will not write for Klonopin-suggested change in medication to Buspar-risk/benefit/side effects d/w cn  8. Hoarseness or changing voice - Ambulatory referral to ENT - DG Chest 2 View; Future Nystatin rinse-uses symbicort with mild thrush 9. Nausea and vomiting, intractability of vomiting not specified, unspecified vomiting type Gallstones noted on CT-concern for taking chronic phenergan - Ambulatory referral to Gastroenterology Outpatient Encounter Medications as of 01/04/2020  Medication Sig  . acetaminophen (TYLENOL) 500 MG tablet Take 500 mg by mouth every 6 (six) hours as needed for fever.   [name] albuterol (PROAIR HFA) 108 (90 Base) MCG/ACT  inhaler Inhale 2 puffs into the lungs every 6 (six) hours as needed. For shortness of breath  . budesonide-formoterol (SYMBICORT) 160-4.5 MCG/ACT inhaler Inhale 2 puffs into the lungs 2 (two) times daily.  . clonazePAM (KLONOPIN) 0.5 MG tablet Take 0.5 mg by mouth 3 (three) times daily.   . fluconazole (DIFLUCAN) 100 MG tablet Take 100 mg by mouth daily.  . fluticasone (FLONASE) 50 MCG/ACT nasal spray Place 2 sprays into both nostrils daily.  [name] glucose blood test strip 1 each 3 (three) times daily. Use as instructed, accucheck test strips  . guaiFENesin (MUCINEX) 600 MG 12 hr tablet Take 1 tablet (600 mg total) by mouth 2 (two) times daily.  . Insulin Glargine (LANTUS) 100 UNIT/ML Solostar Pen Inject 15 Units into the skin daily.  . Insulin Pen Needle 31G X 5 MM MISC Use as directed  . lisinopril (PRINIVIL,ZESTRIL) 20 MG tablet Take 20 mg by mouth daily.  . metFORMIN (GLUCOPHAGE) 500 MG tablet Take 1 tablet by mouth 2 (two) times daily.  . Oxycodone HCl 10 MG TABS Take 10 mg by mouth 3 (three) times daily.  . pantoprazole (PROTONIX) 40 MG tablet Take 40 mg by mouth daily.  . pregabalin (LYRICA) 100 MG capsule Take 100 mg by mouth 3 (three) times daily.  . promethazine (PHENERGAN) 25 MG tablet Take 25 mg by mouth every 6 (six) hours as needed for nausea.   . SUMAtriptan (IMITREX) 50 MG tablet Take 50 mg by mouth daily as needed for migraine or headache.   . tiotropium (SPIRIVA) 18 MCG inhalation capsule Place 18 mcg into inhaler and inhale daily.  [name] albuterol (PROVENTIL) (2.5 MG/3ML) 0.083% nebulizer solution Take 2.5 mg by nebulization every 4 (four) hours as needed. For shortness of breath   . azithromycin (ZITHROMAX Z-PAK) 250 MG tablet Take 1 tablet (250 mg total) by mouth daily. 500mg  PO day 1, then 250mg  PO days 205  . ibuprofen (ADVIL) 600 MG tablet Take 1 tablet (600 mg total) by mouth every 6 (six) hours as needed.  [name] LYRICA 100 MG capsule Take 100 mg by mouth 2 (two) times daily.  .  ondansetron (ZOFRAN ODT) 4 MG disintegrating tablet Take 1 tablet (4 mg total) by mouth every 8 (eight) hours as needed for nausea or vomiting.  . OxyCODONE (OXYCONTIN) 10 mg T12A Take 1 tablet (10 mg total) by mouth  every 12 (twelve) hours.  . predniSONE (DELTASONE) 10 MG tablet Take 40mg  po daily for 2 days then 30mg  daily for 2 days then 20mg  daily for 2 days then 10mg  daily for 2 days then stop  . [DISCONTINUED] ARIPiprazole (ABILIFY) 5 MG tablet Take 5 mg by mouth daily.    . [DISCONTINUED] venlafaxine (EFFEXOR) 75 MG tablet Take 75 mg by mouth daily.     No facility-administered encounter medications on file as of 01/04/2020.    Follow-up:pulmonary, endo, diabetic education, gastro  Topacio Cella , MD

## 2020-01-04 NOTE — Patient Instructions (Addendum)
Check glucose readings before breakfast, before lunch, before your evening meal and 2 hours after your evening meal Increase metformin to 1000mg  twice a day Pulmonary referral  Endo referral Diabetes referral ENT referral CBC, CMP CXR at [facility] Buspar Use nystatin rinse

## 2020-01-09 ENCOUNTER — Encounter (INDEPENDENT_AMBULATORY_CARE_PROVIDER_SITE_OTHER): Payer: Self-pay

## 2020-01-11 DIAGNOSIS — R112 Nausea with vomiting, unspecified: Secondary | ICD-10-CM | POA: Insufficient documentation

## 2020-01-11 DIAGNOSIS — R499 Unspecified voice and resonance disorder: Secondary | ICD-10-CM | POA: Insufficient documentation

## 2020-01-15 ENCOUNTER — Ambulatory Visit (INDEPENDENT_AMBULATORY_CARE_PROVIDER_SITE_OTHER): Payer: Medicaid Other | Admitting: Internal Medicine

## 2020-01-19 ENCOUNTER — Ambulatory Visit: Payer: Self-pay | Admitting: "Endocrinology

## 2020-02-08 ENCOUNTER — Other Ambulatory Visit: Payer: Self-pay | Admitting: Family Medicine

## 2020-02-08 MED ORDER — ALBUTEROL SULFATE HFA 108 (90 BASE) MCG/ACT IN AERS: 2.0000 | INHALATION_SPRAY | Freq: Four times a day (QID) | RESPIRATORY_TRACT | 0 refills | Status: DC | PRN

## 2020-02-08 MED ORDER — ALBUTEROL SULFATE (2.5 MG/3ML) 0.083% IN NEBU: 2.5000 mg | INHALATION_SOLUTION | RESPIRATORY_TRACT | 1 refills | Status: DC | PRN

## 2020-02-09 ENCOUNTER — Ambulatory Visit: Payer: Self-pay | Admitting: "Endocrinology

## 2020-02-12 ENCOUNTER — Other Ambulatory Visit: Payer: Self-pay | Admitting: Family Medicine

## 2020-02-15 IMAGING — MR MR LUMBAR SPINE W/O CM
4 of 5 series · 15 of 48 positions shown · non-contrast
Comparison: MRI lumbar spine dated August 21, 2010.

CLINICAL DATA: Low back pain radiating into both legs for the past
2 months.

EXAM:
MRI LUMBAR SPINE WITHOUT CONTRAST
TECHNIQUE: Multiplanar, multisequence MR imaging of the lumbar spine was
performed. No intravenous contrast was administered.

[Series 3: T2 · sagittal · 4.0mm · 0.72mm/px · 6 of 15 slices shown (1 of 2)]
[im 1/15]
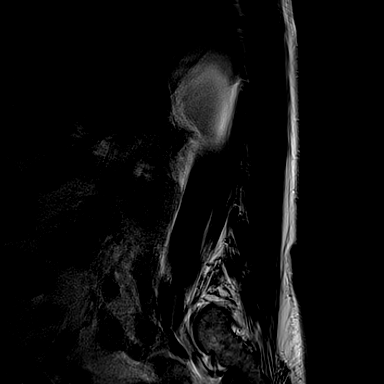
[im 3/15]
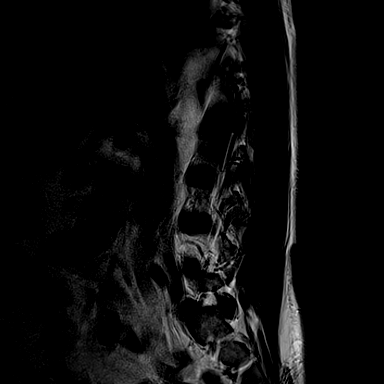
[im 6/15]
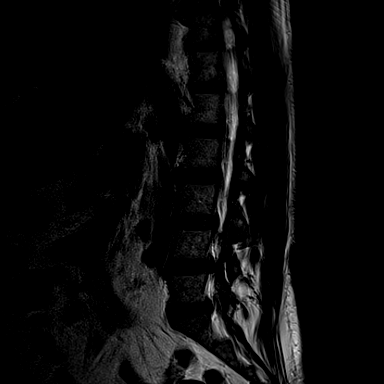
[im 9/15]
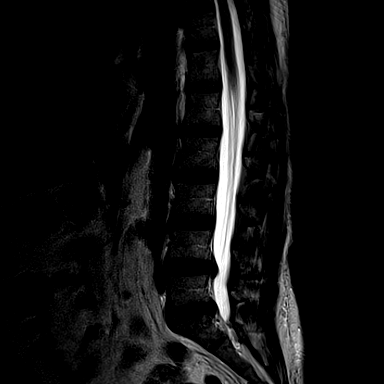
[im 12/15]
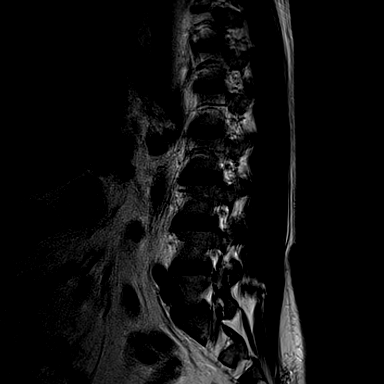
[im 15/15]
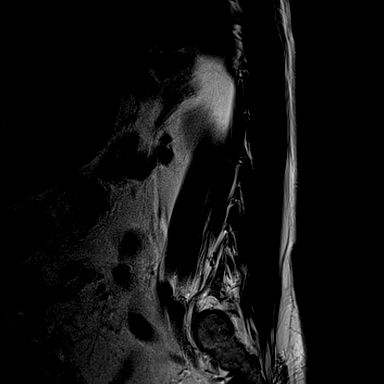

[Series 4: T1 · sagittal · 4.0mm · 0.36mm/px · 3 of 15 slices shown (1 of 2)]
[im 3/15]
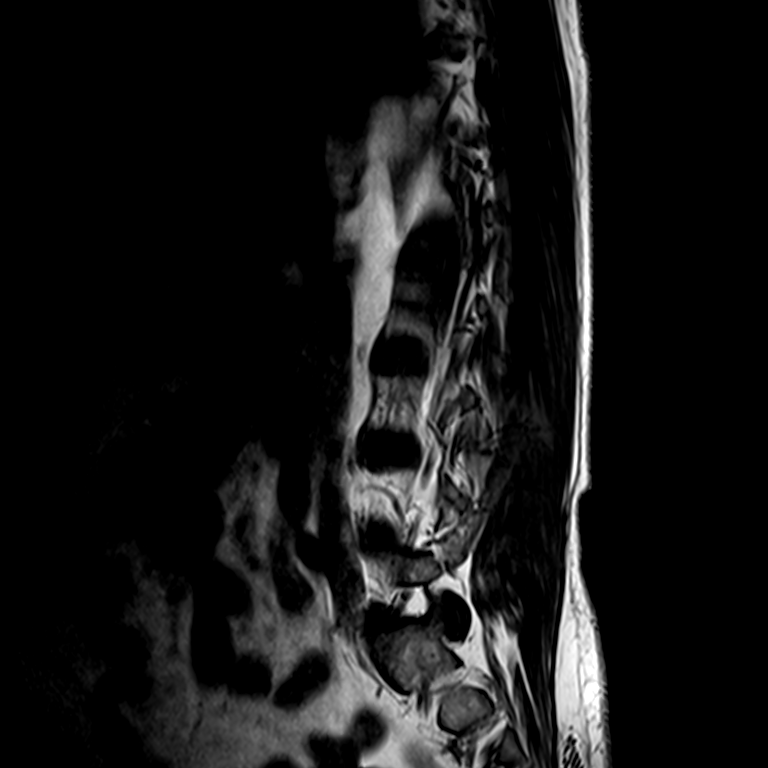
[im 9/15]
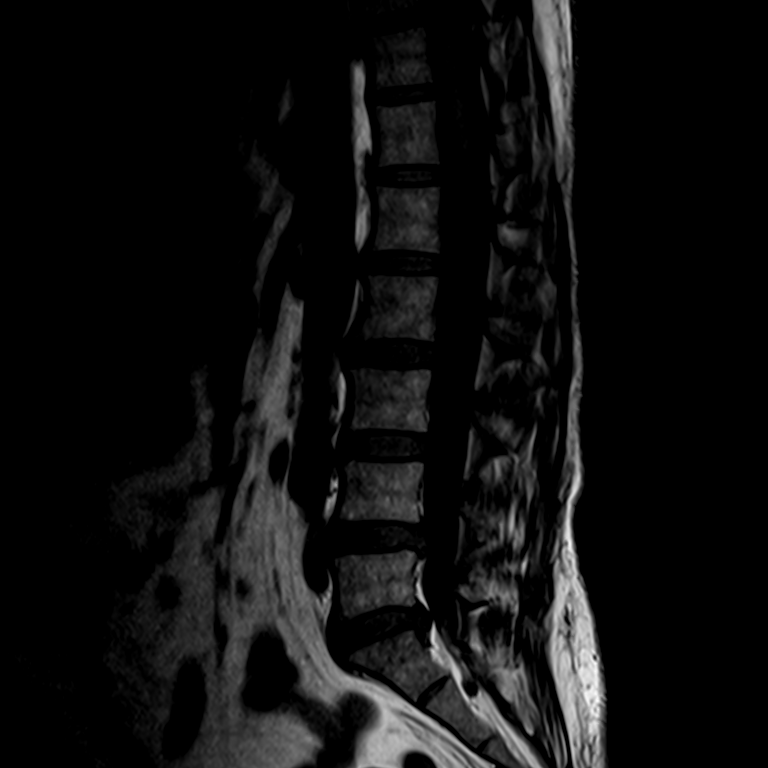
[im 15/15]
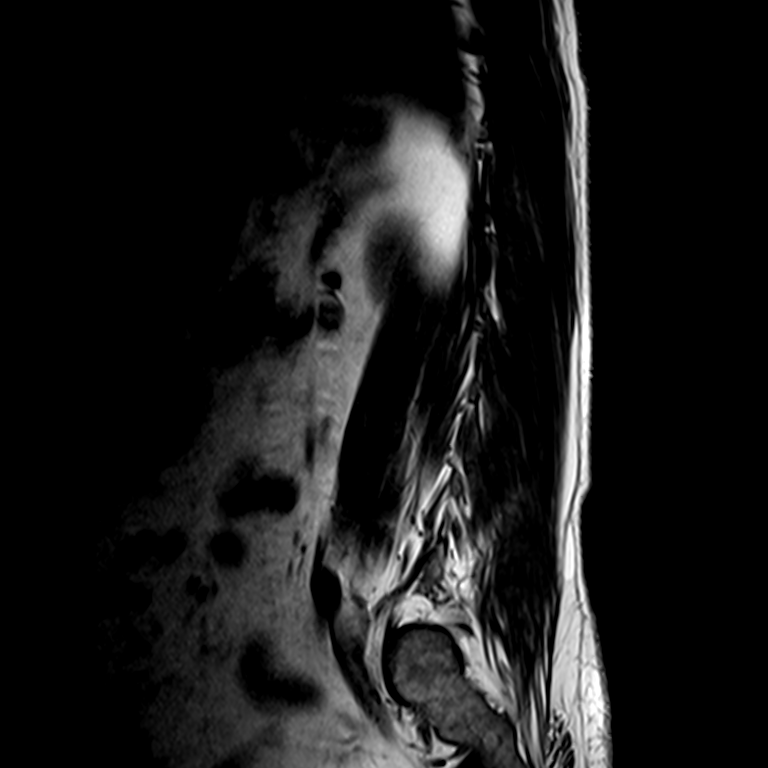

[Series 6: T2 · axial · 4.0mm · 0.24mm/px · z∈[-21,+102]mm · 3 of 36 slices shown (2 of 2)]
[im 6/36]
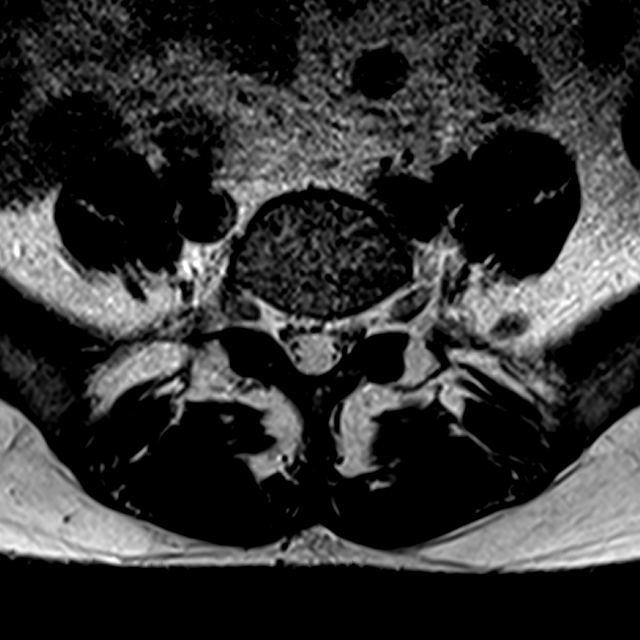
[im 18/36]
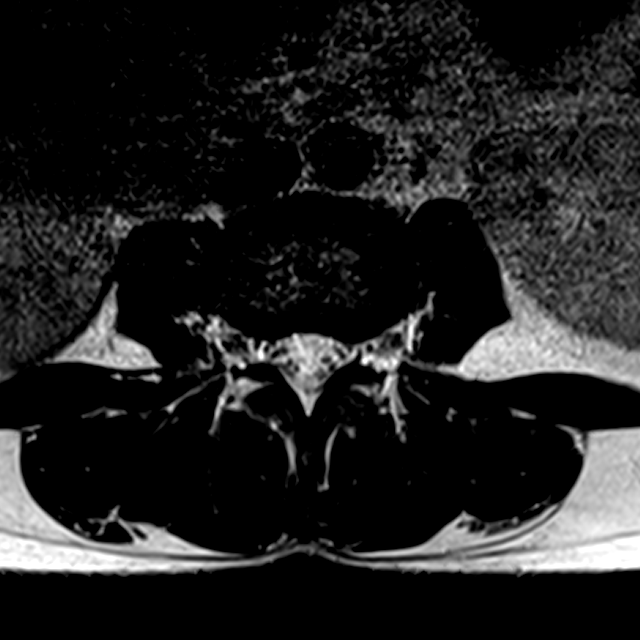
[im 31/36]
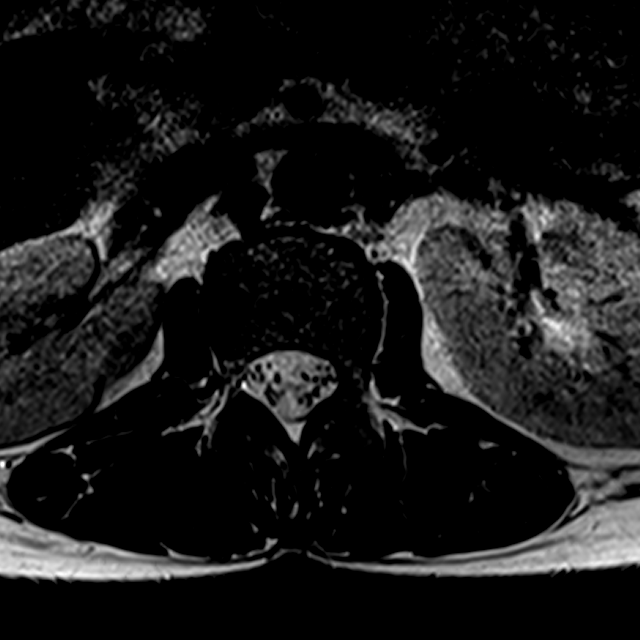

[Series 7: T1 · axial · 4.0mm · 0.24mm/px · z∈[-21,+102]mm · 3 of 36 slices shown (2 of 2)]
[im 6/36]
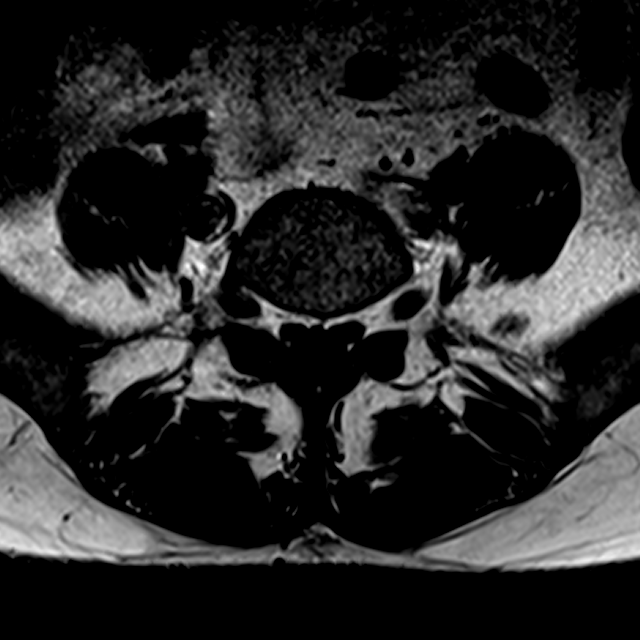
[im 18/36]
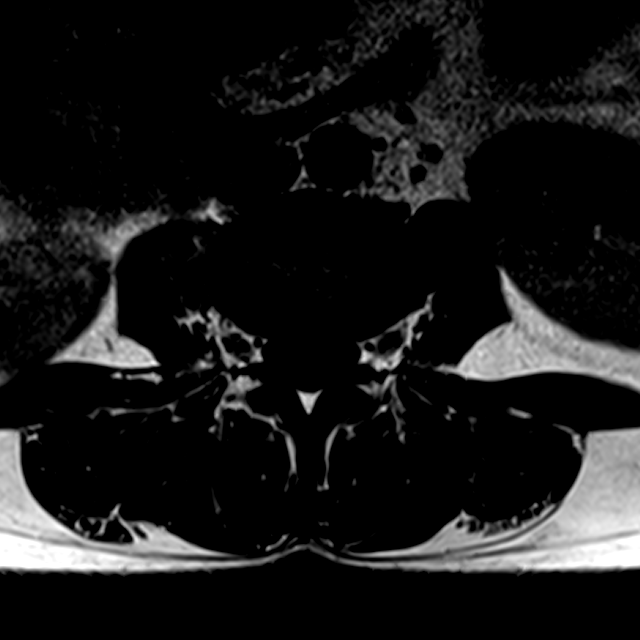
[im 31/36]
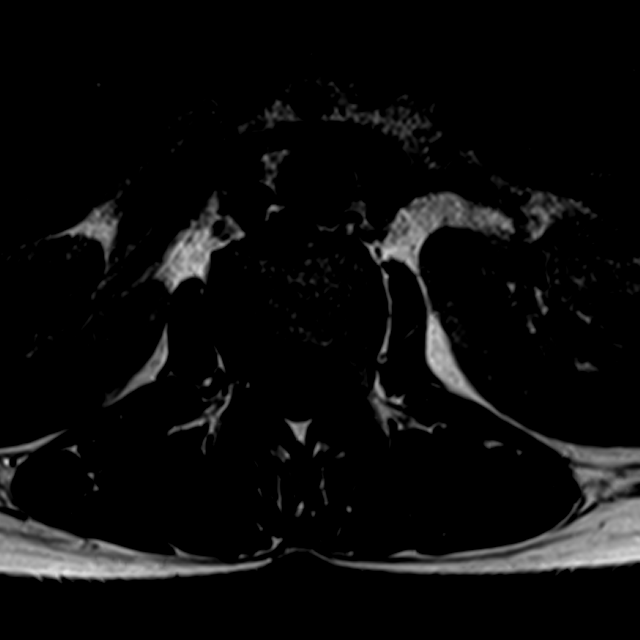

[15 of 48 positions shown; findings below may reference images not displayed]

FINDINGS: Segmentation:  Standard.

Alignment:  Physiologic.

Vertebrae:  No fracture, evidence of discitis, or bone lesion.

Conus medullaris and cauda equina: Conus extends to the T12-L1
level. Conus and cauda equina appear normal.

Paraspinal and other soft tissues: Negative.

Disc levels:

T12-L1:  Negative.

L1-L2: Mild disc desiccation. No significant disc bulge or
herniation. No spinal canal or neuroforaminal stenosis.

L2-L3: Mild disc desiccation. No significant disc bulge or
herniation. No spinal canal or neuroforaminal stenosis.

L3-L4: No significant disc bulge or herniation. No spinal canal or
neuroforaminal stenosis.

L4-L5: Slightly increased broad-based moderate central disc
protrusion with worsening severe right and unchanged mild left
lateral recess stenosis. No spinal canal or neuroforaminal stenosis.

L5-S1: New broad-based central disc protrusion abutting the left
greater than right descending S1 nerve roots. No spinal canal or
neuroforaminal stenosis.
IMPRESSION: 1. Increasing moderate central disc protrusion at L4-L5 with
worsening severe right and unchanged mild left lateral recess
stenosis that could affect either descending L5 nerve root.
2. New central disc protrusion at L5-S1 abutting the left greater
than right descending S1 nerve roots.

## 2020-02-19 ENCOUNTER — Ambulatory Visit: Payer: Self-pay | Admitting: "Endocrinology

## 2020-02-19 ENCOUNTER — Encounter: Payer: Self-pay | Admitting: "Endocrinology

## 2020-02-19 ENCOUNTER — Telehealth: Payer: Self-pay | Admitting: "Endocrinology

## 2020-02-19 NOTE — Telephone Encounter (Signed)
Noted- mailed letter 3/29

## 2020-02-19 NOTE — Telephone Encounter (Signed)
Patient has no showed 3 new pt appts. Do you want me to r/s or snd letter

## 2020-02-19 NOTE — Telephone Encounter (Signed)
Send letter, and no more appointment.

## 2020-03-02 IMAGING — DX DG CHEST 2V
2 series · 2 of 2 positions shown · non-contrast
Comparison: Chest x-ray 05/10/2018

CLINICAL DATA: Sore throat, right ear pain and dizziness. History
of COPD.

EXAM:
CHEST - 2 VIEW

[chest pa]
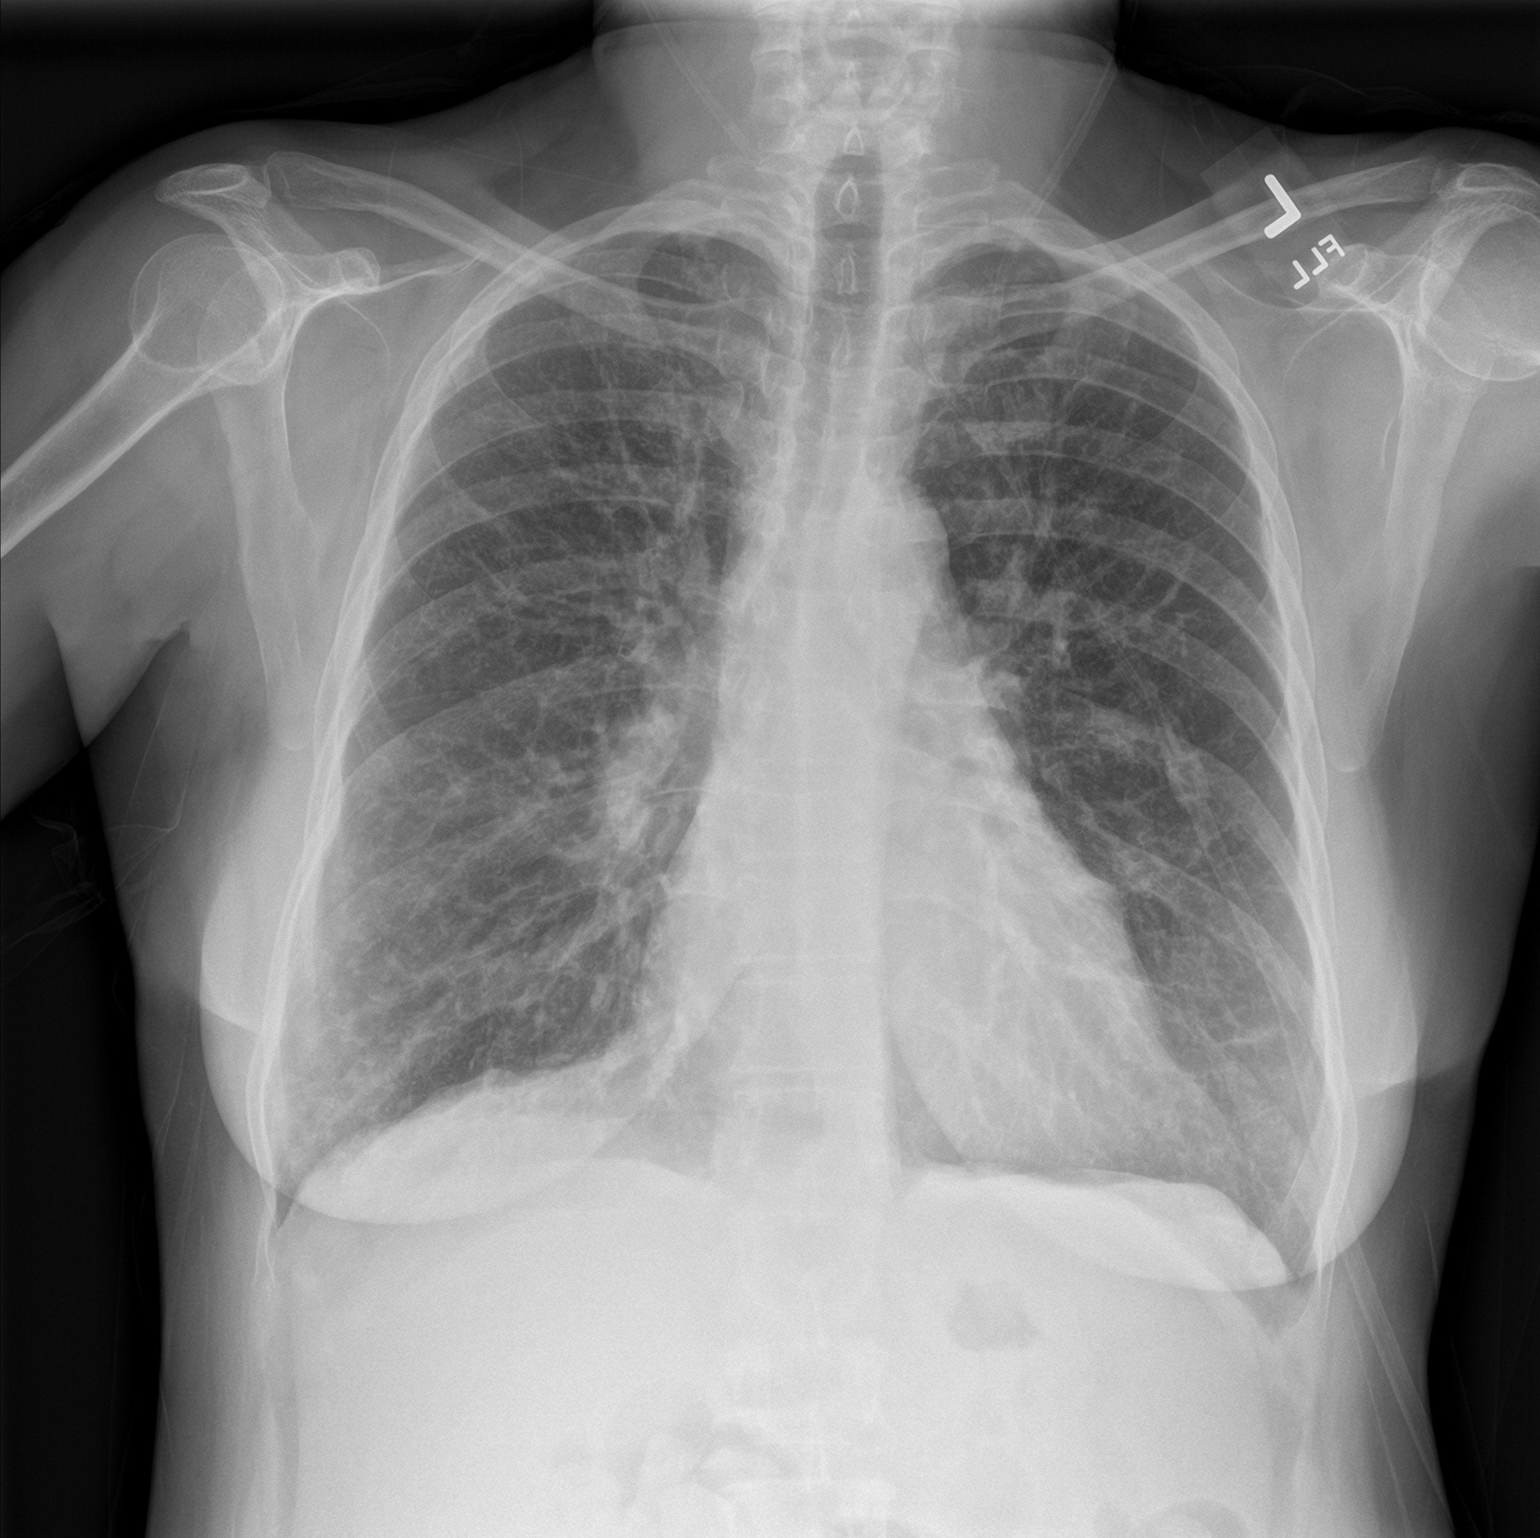

[chest lat]
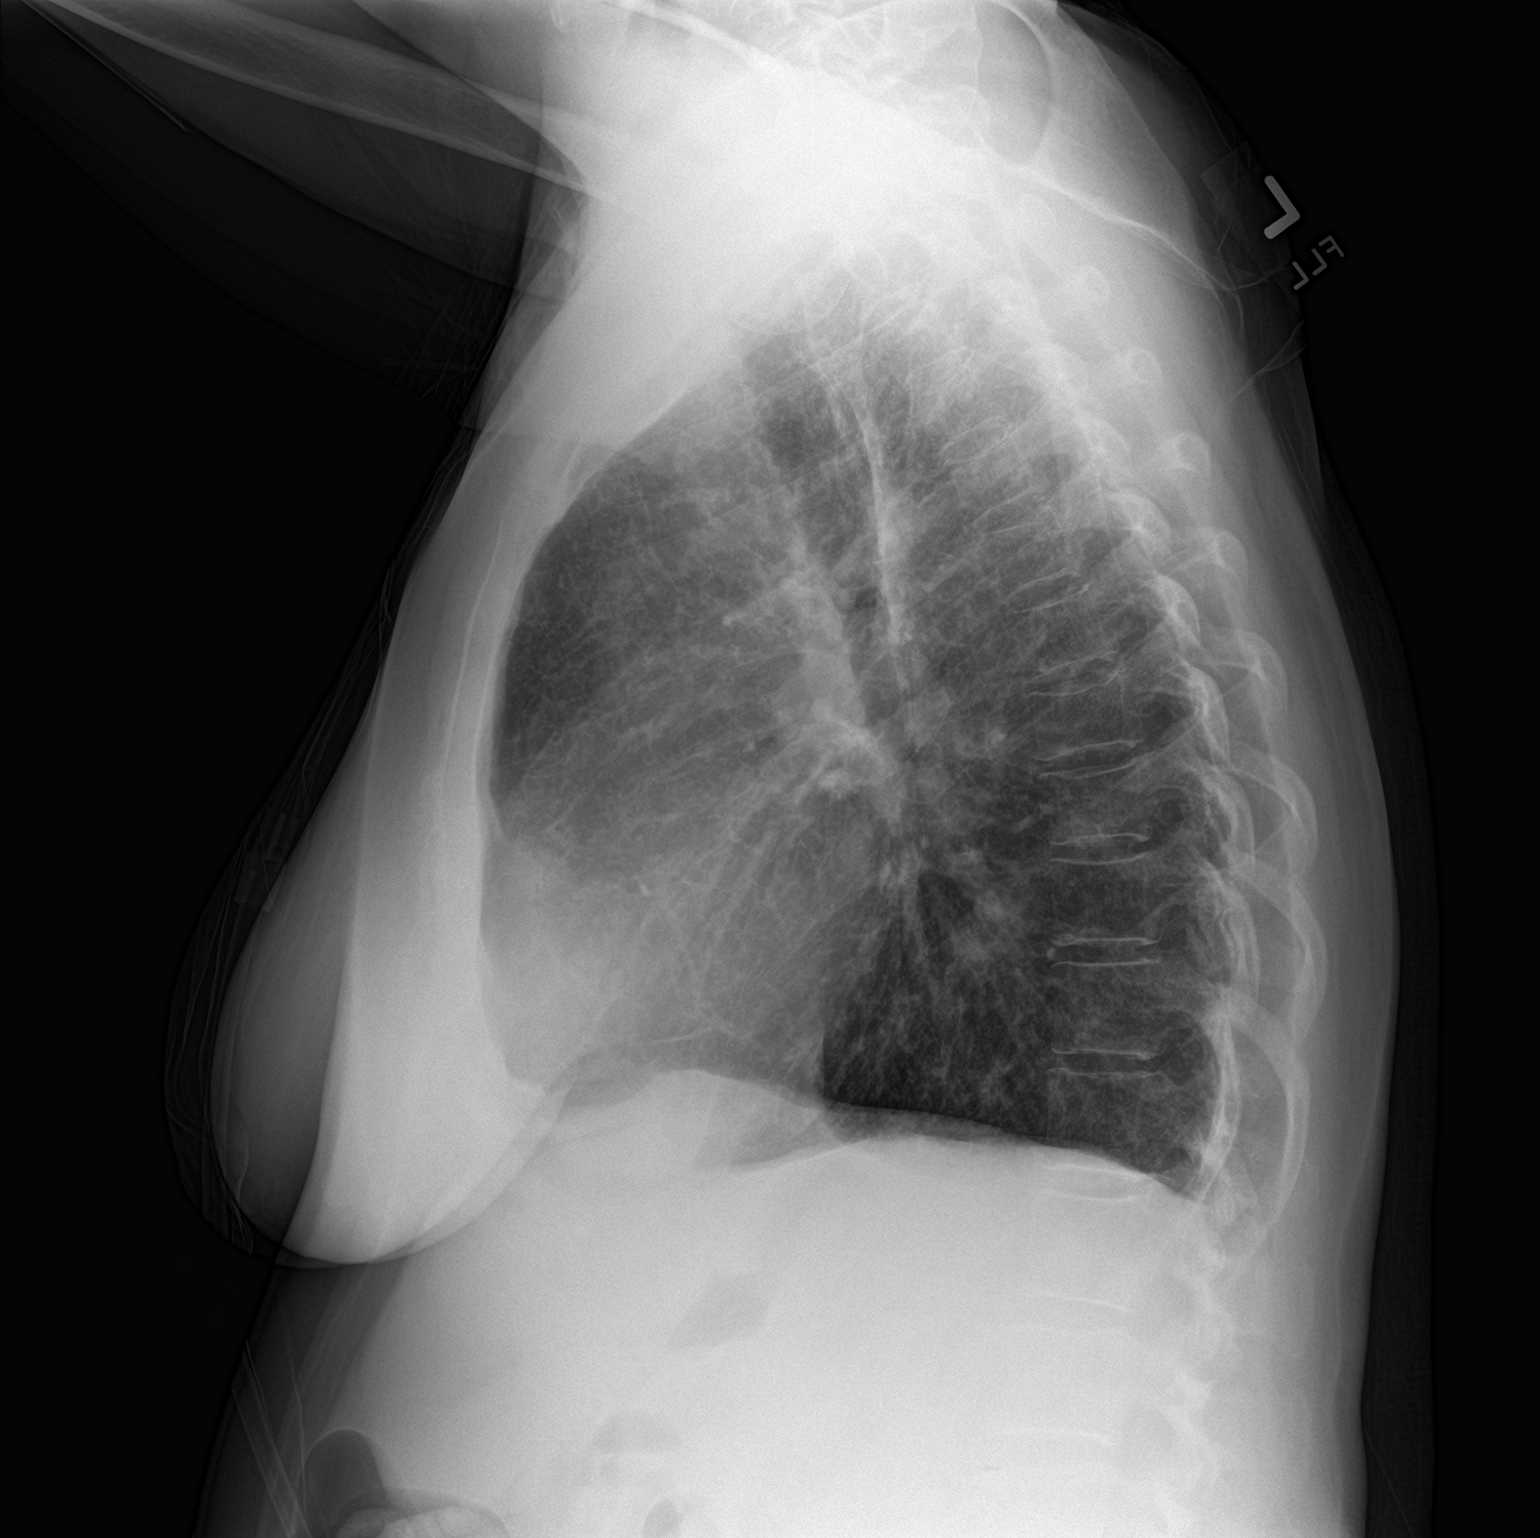

[2 of 2 positions shown; findings below may reference images not displayed]

FINDINGS: The cardiac silhouette, mediastinal and hilar contours are within
normal limits and stable. Stable underlying emphysematous changes.
Persistent nodular interstitial pattern which could reflect scarring
changes or persistent tree-in-bud findings. No focal infiltrates or
effusions. The bony thorax is intact.
IMPRESSION: 1. Chronic nodular interstitial changes in both lungs possibly due
to scarring or chronic tree-in-bud process.
2. Underlying emphysematous changes.
3. No focal infiltrates or effusions.

## 2020-03-05 ENCOUNTER — Other Ambulatory Visit: Payer: Self-pay | Admitting: Family Medicine

## 2020-03-11 ENCOUNTER — Other Ambulatory Visit: Payer: Self-pay | Admitting: Family Medicine

## 2020-03-11 ENCOUNTER — Encounter: Payer: Self-pay | Admitting: Family Medicine

## 2020-03-11 ENCOUNTER — Telehealth: Payer: Self-pay | Admitting: Family Medicine

## 2020-03-11 MED ORDER — ALBUTEROL SULFATE HFA 108 (90 BASE) MCG/ACT IN AERS: 2.0000 | INHALATION_SPRAY | Freq: Four times a day (QID) | RESPIRATORY_TRACT | 0 refills | Status: DC | PRN

## 2020-03-11 MED ORDER — TIOTROPIUM BROMIDE MONOHYDRATE 18 MCG IN CAPS: 18.0000 ug | ORAL_CAPSULE | Freq: Every day | RESPIRATORY_TRACT | 1 refills | Status: DC

## 2020-03-11 MED ORDER — PANTOPRAZOLE SODIUM 40 MG PO TBEC: 40.0000 mg | DELAYED_RELEASE_TABLET | Freq: Every day | ORAL | 1 refills | Status: DC

## 2020-03-11 MED ORDER — LISINOPRIL 20 MG PO TABS: 20.0000 mg | ORAL_TABLET | Freq: Every day | ORAL | 1 refills | Status: DC

## 2020-03-11 MED ORDER — SUMATRIPTAN SUCCINATE 50 MG PO TABS: 50.0000 mg | ORAL_TABLET | Freq: Every day | ORAL | 0 refills | Status: DC | PRN

## 2020-03-11 NOTE — Progress Notes (Signed)
Pt did not keep appt with endo-pt needs to see an endocrinologist

## 2020-03-11 NOTE — Telephone Encounter (Signed)
Patient spoke with you this morning over mychart about medications that she needed, she states her needles were not called in.  Insulin Pen Needle 31G X 5 MM MISC    [id number] - [place], [place] - [address] AT [place] [address] Phone:  [phone]  Fax:  [phone]

## 2020-03-12 ENCOUNTER — Other Ambulatory Visit: Payer: Self-pay | Admitting: Emergency Medicine

## 2020-03-12 DIAGNOSIS — E1165 Type 2 diabetes mellitus with hyperglycemia: Secondary | ICD-10-CM

## 2020-03-12 DIAGNOSIS — J439 Emphysema, unspecified: Secondary | ICD-10-CM

## 2020-03-12 MED ORDER — BUDESONIDE-FORMOTEROL FUMARATE 160-4.5 MCG/ACT IN AERO: 2.0000 | INHALATION_SPRAY | Freq: Two times a day (BID) | RESPIRATORY_TRACT | 2 refills | Status: DC

## 2020-03-12 MED ORDER — INSULIN PEN NEEDLE 31G X 5 MM MISC: 0 refills | Status: DC

## 2020-03-12 NOTE — Telephone Encounter (Signed)
The pharmacy called and states they are also needing a refill on the following medication.   budesonide-formoterol (SYMBICORT) 160-4.5 MCG/ACT inhaler  Insulin Pen Needle 31G X 5 MM MISC

## 2020-03-12 NOTE — Telephone Encounter (Signed)
RX SENT

## 2020-03-14 ENCOUNTER — Telehealth: Payer: Self-pay | Admitting: Family Medicine

## 2020-03-14 NOTE — Telephone Encounter (Signed)
Patient called in requesting an appointment to be seen for sore throat. Informed patient with that being a symptom of covid and on the prescreening questions she would need to go to urgent care as we can not risk bringing that into the office. Patient said she knows it is strep throat and stated what good are we and hung up. Just fyi.

## 2020-03-14 NOTE — Telephone Encounter (Signed)
Thanks for informing

## 2020-03-16 IMAGING — DX CHEST - 2 VIEW
2 series · 2 of 2 positions shown · non-contrast
Comparison: 01/26/2019

CLINICAL DATA: Shortness of breath for 1 week, productive cough,
diarrhea for 4 days, vomiting today, history asthma, COPD, aorta
yes, diabetes mellitus, hypertension

EXAM:
CHEST - 2 VIEW

[chest lat]
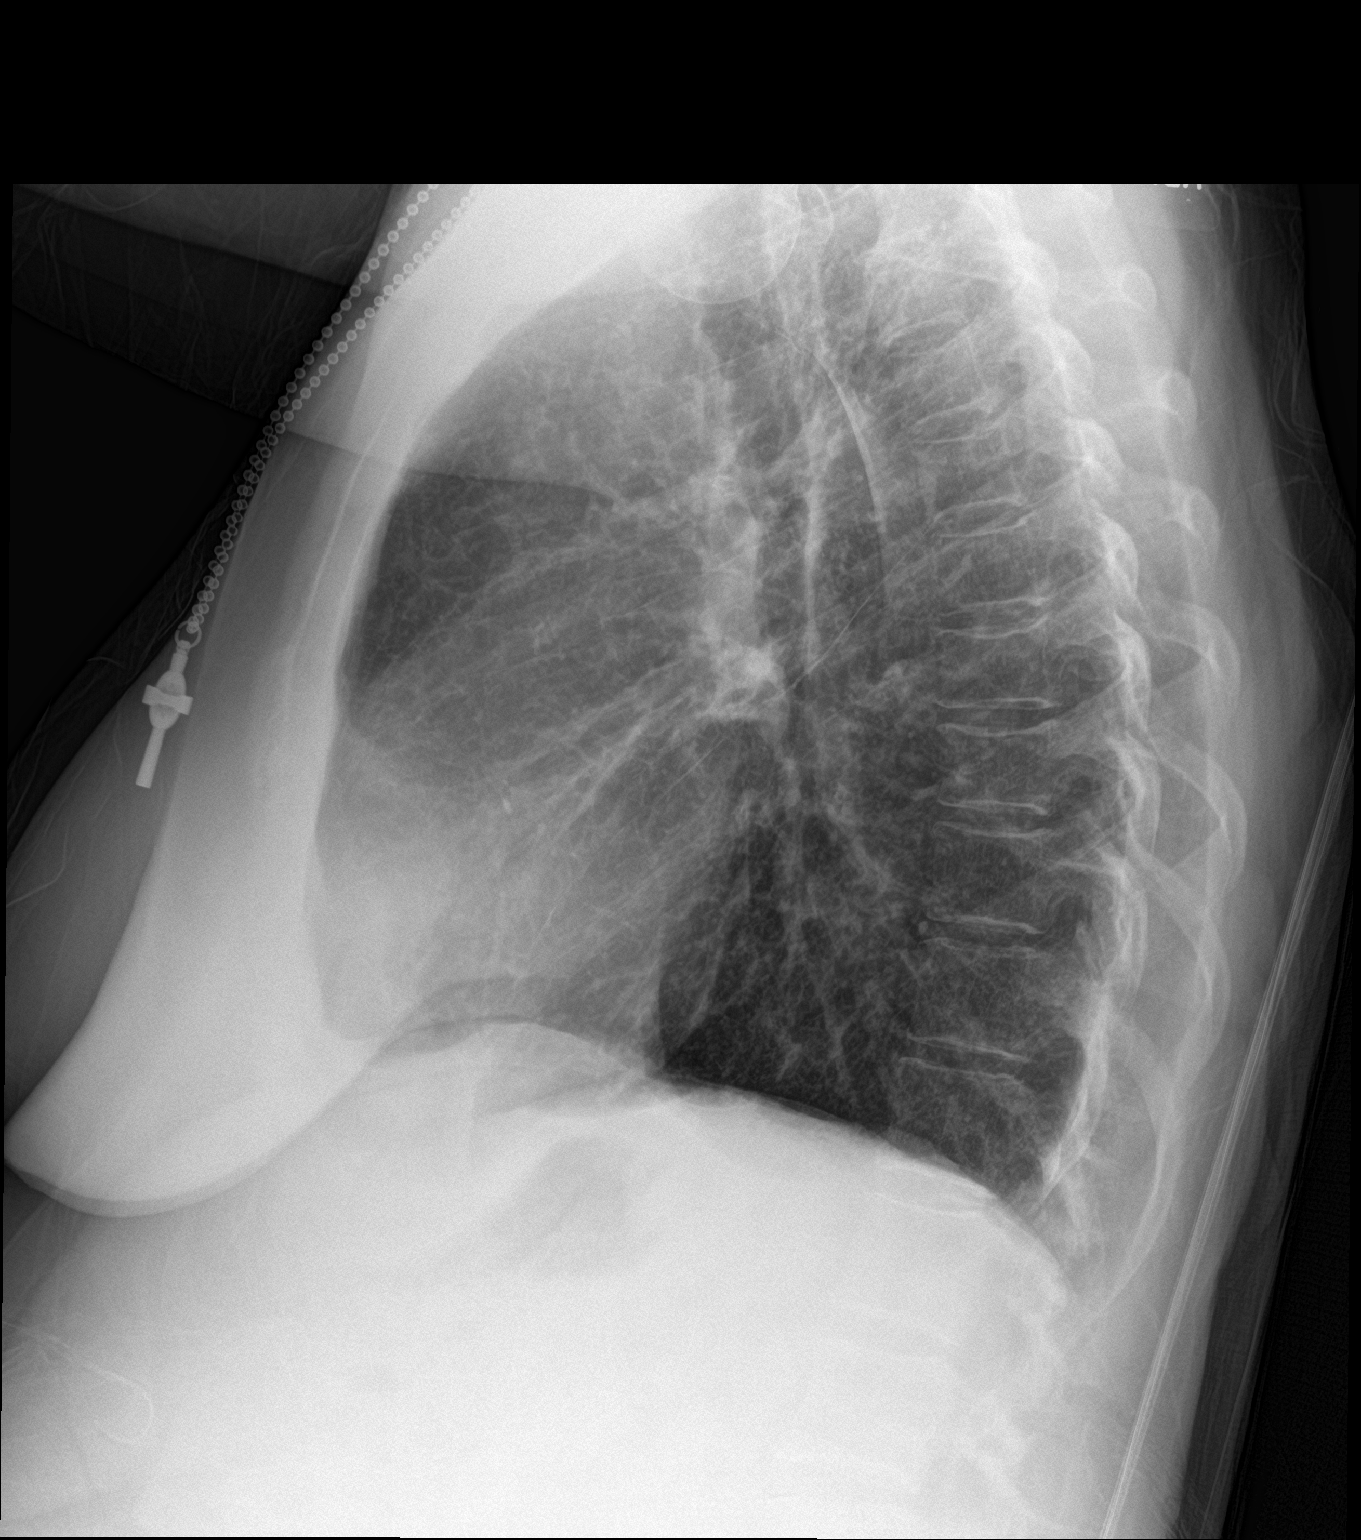

[chest ap]
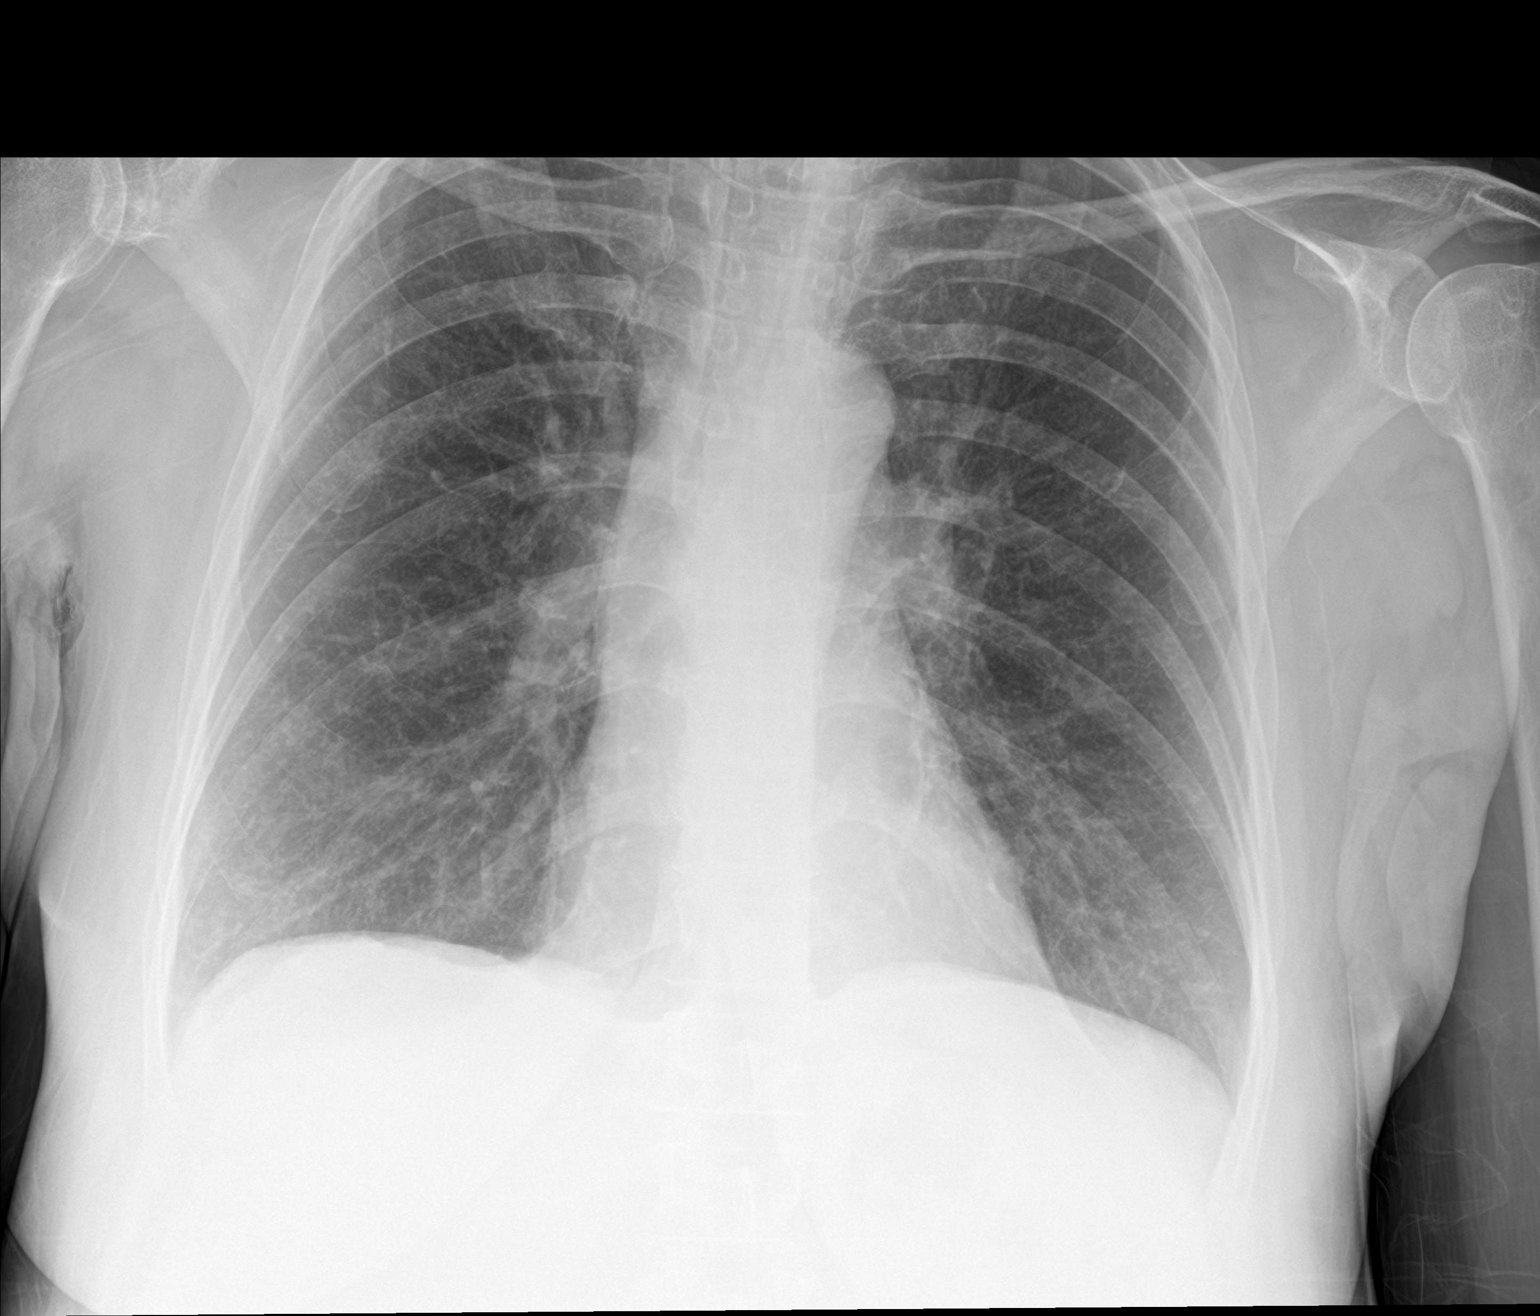

[2 of 2 positions shown; findings below may reference images not displayed]

FINDINGS: Normal heart size, mediastinal contours, and pulmonary vascularity.

Questionable vague nodular density versus summation artifact mid
RIGHT chest 10 mm diameter. Lungs otherwise clear.

No pulmonary infiltrate, pleural effusion or pneumothorax.

Bones unremarkable.
IMPRESSION: Questionable 10 mm nodular density RIGHT upper lobe; CT chest
recommended to exclude pulmonary nodule.

## 2020-03-27 ENCOUNTER — Ambulatory Visit: Payer: Medicaid Other | Admitting: Family Medicine

## 2020-04-09 ENCOUNTER — Ambulatory Visit: Payer: Medicaid Other | Admitting: Family Medicine

## 2020-04-18 ENCOUNTER — Encounter (HOSPITAL_COMMUNITY): Payer: Self-pay | Admitting: Emergency Medicine

## 2020-04-18 ENCOUNTER — Emergency Department (HOSPITAL_COMMUNITY)
Admission: EM | Admit: 2020-04-18 | Discharge: 2020-04-18 | Disposition: A | Discharge status: Home / Self Care | Payer: Medicaid Other | Attending: Emergency Medicine | Admitting: Emergency Medicine

## 2020-04-18 ENCOUNTER — Other Ambulatory Visit: Payer: Self-pay

## 2020-04-18 DIAGNOSIS — Z79899 Other long term (current) drug therapy: Secondary | ICD-10-CM | POA: Diagnosis not present

## 2020-04-18 DIAGNOSIS — J449 Chronic obstructive pulmonary disease, unspecified: Secondary | ICD-10-CM | POA: Insufficient documentation

## 2020-04-18 DIAGNOSIS — Z7984 Long term (current) use of oral hypoglycemic drugs: Secondary | ICD-10-CM | POA: Insufficient documentation

## 2020-04-18 DIAGNOSIS — G43909 Migraine, unspecified, not intractable, without status migrainosus: Secondary | ICD-10-CM | POA: Diagnosis not present

## 2020-04-18 DIAGNOSIS — I10 Essential (primary) hypertension: Secondary | ICD-10-CM | POA: Insufficient documentation

## 2020-04-18 DIAGNOSIS — E119 Type 2 diabetes mellitus without complications: Secondary | ICD-10-CM | POA: Insufficient documentation

## 2020-04-18 DIAGNOSIS — F17210 Nicotine dependence, cigarettes, uncomplicated: Secondary | ICD-10-CM | POA: Diagnosis not present

## 2020-04-18 DIAGNOSIS — R519 Headache, unspecified: Secondary | ICD-10-CM | POA: Diagnosis present

## 2020-04-18 LAB — BASIC METABOLIC PANEL
Anion gap: 10 (ref 5–15)
BUN: 8 mg/dL (ref 6–20)
CO2: 35 mmol/L — ABNORMAL HIGH (ref 22–32)
Calcium: 9.3 mg/dL (ref 8.9–10.3)
Chloride: 91 mmol/L — ABNORMAL LOW (ref 98–111)
Creatinine, Ser: 0.41 mg/dL — ABNORMAL LOW (ref 0.44–1.00)
GFR calc Af Amer: >60 mL/min (ref >60)
GFR calc non Af Amer: >60 mL/min (ref >60)
Glucose, Bld: 289 mg/dL — ABNORMAL HIGH (ref 70–99)
Potassium: 3.2 mmol/L — ABNORMAL LOW (ref 3.5–5.1)
Sodium: 136 mmol/L (ref 135–145)

## 2020-04-18 LAB — CBC
HCT: 42.3 % (ref 36.0–46.0)
Hemoglobin: 13.9 g/dL (ref 12.0–15.0)
MCH: 27.1 pg (ref 26.0–34.0)
MCHC: 32.9 g/dL (ref 30.0–36.0)
MCV: 82.5 fL (ref 80.0–100.0)
Platelets: 308 10*3/uL (ref 150–400)
RBC: 5.13 MIL/uL — ABNORMAL HIGH (ref 3.87–5.11)
RDW: 11.8 % (ref 11.5–15.5)
WBC: 15 10*3/uL — ABNORMAL HIGH (ref 4.0–10.5)
nRBC: 0 % (ref 0.0–0.2)

## 2020-04-18 MED ORDER — DIPHENHYDRAMINE HCL 50 MG/ML IJ SOLN
25.0000 mg | Freq: Once | INTRAMUSCULAR | Status: AC
  Administered 2020-04-18: 25 mg via INTRAVENOUS
  Filled 2020-04-18: qty 1

## 2020-04-18 MED ORDER — PROCHLORPERAZINE EDISYLATE 10 MG/2ML IJ SOLN
10.0000 mg | Freq: Once | INTRAMUSCULAR | Status: AC
  Administered 2020-04-18: 10 mg via INTRAVENOUS
  Filled 2020-04-18: qty 2

## 2020-04-18 MED ORDER — SODIUM CHLORIDE 0.9 % IV BOLUS
500.0000 mL | Freq: Once | INTRAVENOUS | Status: AC
  Administered 2020-04-18: 500 mL via INTRAVENOUS

## 2020-04-18 MED ORDER — KETOROLAC TROMETHAMINE 30 MG/ML IJ SOLN
15.0000 mg | Freq: Once | INTRAMUSCULAR | Status: AC
  Administered 2020-04-18: 15 mg via INTRAVENOUS
  Filled 2020-04-18: qty 1

## 2020-04-18 NOTE — ED Provider Notes (Signed)
[facility] EMERGENCY DEPARTMENT Provider Note   CSN: [id number] Arrival date & time: 04/18/20  0013     History Chief Complaint  Patient presents with  . Migraine    [name] is a 54 y.o. female.  Patient presents to the emergency department for evaluation of a migraine headache.  Patient reports a headache that started at approximately 1130 tonight.  She took Imitrex as prescribed, but had associated nausea and vomiting and does not think that the medicine stayed down.  Patient with light sensitivity associated with the headache.  No fever, neck pain or stiffness.        Past Medical History:  Diagnosis Date  . Angina   . Anxiety state 10/15/2015  . ARDS (adult respiratory distress syndrome) [facility])    Jan 2011  . Asthma   . Chronic back pain   . COPD (chronic obstructive pulmonary disease) (HCC)   . Diabetes mellitus   . GERD (gastroesophageal reflux disease) 12/21/2012  . HTN (hypertension) 10/15/2015  . Hyperglycemia, drug-induced    steroid induced hyperglycemia  . Migraine headache   . On home O2   . Pneumonia   . Recurrent upper respiratory infection (URI)   . Shortness of breath     Patient Active Problem List   Diagnosis Date Noted  . Hoarseness or changing voice 01/11/2020  . Nausea and vomiting 01/11/2020  . Acute on chronic respiratory failure with hypoxia and hypercapnia (HCC) 11/27/2019  . Chronic pain syndrome 11/27/2019  . Lobar pneumonia (HCC) 11/27/2019  . Atypical pneumonia 09/28/2016  . Chronic obstructive pulmonary disease (HCC)   . Pneumonia 05/30/2016  . SIRS (systemic inflammatory response syndrome) (HCC) 10/16/2015  . Sepsis (HCC) 10/15/2015  . Anxiety state 10/15/2015  . HTN (hypertension) 10/15/2015  . Hypokalemia 10/15/2015  . COPD with acute exacerbation (HCC) 08/14/2014  . Acute-on-chronic respiratory failure (HCC) 03/13/2014  . CAP (community acquired pneumonia) 03/11/2014  . Community acquired pneumonia 03/11/2014  .  COPD exacerbation (HCC) 04/22/2013  . GERD (gastroesophageal reflux disease) 12/21/2012  . Constipation 12/21/2012  . Healthcare-associated pneumonia 02/03/2012  . Candida infection 11/26/2011  . Viral syndrome 11/21/2011  . DM (diabetes mellitus) (HCC) 11/21/2011  . Thrush, oral 07/03/2011  . Low back pain 06/16/2011  . Muscle spasms of lower extremity 06/16/2011  . TOBACCO ABUSE 09/12/2010  . 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 COPD (chronic obstructive pulmonary disease) (HCC) 02/14/2010  . UNSPECIFIED NUTRITIONAL DEFICIENCY 02/13/2010  . ADULT RESPIRATORY DISTRESS SYNDROME 02/13/2010  . DELIRIUM 02/13/2010    Past Surgical History:  Procedure Laterality Date  . c-section    . TRACHEOSTOMY     decannulated 12/2009  . TUBAL LIGATION    . uterine ablasion       OB History   No obstetric history on file.     Family History  Problem Relation Age of Onset  . Coronary artery disease Brother   . Diabetes Other   . Cancer Other   . Hypertension Other     Social History   Tobacco Use  . Smoking status: Current Every Day Smoker    Packs/day: 1.00    Years: 33.00    Pack years: 33.00    Types: Cigarettes  . Smokeless tobacco: Never Used  . Tobacco comment: 1/2 pack a day  since age 39  Substance Use Topics  . Alcohol use: No  . Drug use: No    Comment: Formerly used cocaine , narcotics, THC  Home Medications Prior to Admission medications   Medication Sig Start Date End Date Taking? Authorizing Provider  acetaminophen (TYLENOL) 500 MG tablet Take 500 mg by mouth every 6 (six) hours as needed for fever.     [provider]  albuterol (PROAIR HFA) 108 (90 Base) MCG/ACT inhaler Inhale 2 puffs into the lungs every 6 (six) hours as needed. For shortness of breath 03/11/20   Wandra Feinstein, MD  albuterol (PROVENTIL) (2.5 MG/3ML) 0.083% nebulizer solution Take 3 mLs (2.5 mg total) by nebulization every 4 (four) hours as needed for  wheezing or shortness of breath. For shortness of breath 02/08/20   Wandra Feinstein, MD  azithromycin (ZITHROMAX Z-PAK) 250 MG tablet Take 1 tablet (250 mg total) by mouth daily. 500mg  PO day 1, then 250mg  PO days 205 11/29/19   , MD  budesonide-formoterol (SYMBICORT) 160-4.5 MCG/ACT inhaler Inhale 2 puffs into the lungs 2 (two) times daily. 03/12/20   Corum, Erick Blinks, MD  busPIRone (BUSPAR) 5 MG tablet Take 1 tablet (5 mg total) by mouth 2 (two) times daily. 01/04/20   Corum, Minerva Fester, MD  clonazePAM (KLONOPIN) 0.5 MG tablet Take 0.5 mg by mouth 3 (three) times daily.     [provider]  fluconazole (DIFLUCAN) 100 MG tablet Take 100 mg by mouth daily.    [provider]  fluticasone (FLONASE) 50 MCG/ACT nasal spray Place 2 sprays into both nostrils daily. 04/13/18   [provider]  glucose blood test strip 1 each by Other route 3 (three) times daily. Use as instructed, accucheck test strips 01/04/20   Corum, 04/15/18, MD  guaiFENesin (MUCINEX) 600 MG 12 hr tablet Take 1 tablet (600 mg total) by mouth 2 (two) times daily. 11/29/19 11/28/20  01/27/20, MD  ibuprofen (ADVIL) 600 MG tablet Take 1 tablet (600 mg total) by mouth every 6 (six) hours as needed. 09/07/19   Erick Blinks, MD  Insulin Glargine (LANTUS) 100 UNIT/ML Solostar Pen Inject 15 Units into the skin daily. 12/02/19   Eber Hong, MD  Insulin Pen Needle 31G X 5 MM MISC Use as directed 03/12/20   Cleora Fleet, MD  lisinopril (ZESTRIL) 20 MG tablet Take 1 tablet (20 mg total) by mouth daily. 03/11/20   Corum, Wandra Feinstein, MD  LYRICA 100 MG capsule Take 100 mg by mouth 2 (two) times daily. 05/02/18   [provider]  metFORMIN (GLUCOPHAGE) 500 MG tablet Take 2 tablets (1,000 mg total) by mouth 2 (two) times daily. 01/04/20   Corum, 07/02/18, MD  nystatin (MYCOSTATIN) 100000 UNIT/ML suspension Take 5 mLs (500,000 Units total) by mouth 4 (four) times daily. 01/04/20   Corum, Minerva Fester, MD  ondansetron (ZOFRAN  ODT) 4 MG disintegrating tablet Take 1 tablet (4 mg total) by mouth every 8 (eight) hours as needed for nausea or vomiting. 02/09/19   Minerva Fester, MD  OxyCODONE (OXYCONTIN) 10 mg T12A Take 1 tablet (10 mg total) by mouth every 12 (twelve) hours. 04/24/13   Terrilee Files, MD  Oxycodone HCl 10 MG TABS Take 10 mg by mouth 3 (three) times daily.    [provider]  pantoprazole (PROTONIX) 40 MG tablet Take 1 tablet (40 mg total) by mouth daily. 03/11/20   Corum, Kari Baars, MD  predniSONE (DELTASONE) 10 MG tablet Take 40mg  po daily for 2 days then 30mg  daily for 2 days then 20mg  daily for 2 days then 10mg  daily for 2 days then stop  11/29/19   Erick Blinks, MD  pregabalin (LYRICA) 100 MG capsule Take 100 mg by mouth 3 (three) times daily.    [provider]  promethazine (PHENERGAN) 25 MG tablet TAKE 1 TABLET BY MOUTH EVERY 6 HOURS AS NEEDED FOR NAUSEA 02/13/20   Corum, Minerva Fester, MD  SUMAtriptan (IMITREX) 50 MG tablet Take 1 tablet (50 mg total) by mouth daily as needed for migraine or headache. 03/11/20   Wandra Feinstein, MD  tiotropium (SPIRIVA) 18 MCG inhalation capsule Place 1 capsule (18 mcg total) into inhaler and inhale daily. 03/11/20   Corum, Minerva Fester, MD  ARIPiprazole (ABILIFY) 5 MG tablet Take 5 mg by mouth daily.    02/03/12  [provider]  venlafaxine (EFFEXOR) 75 MG tablet Take 75 mg by mouth daily.    02/03/12  [provider]    Allergies    Penicillins, Levaquin [levofloxacin in d5w], Doxycycline, and Morphine  Review of Systems   Review of Systems  Gastrointestinal: Positive for nausea and vomiting.  Neurological: Positive for headaches.  All other systems reviewed and are negative.   Physical Exam Updated Vital Signs BP (!) 164/105 (BP Location: Left Arm)   Pulse (!) 104   Temp 99.8 F (37.7 C) (Oral)   Resp 18   Ht 5' (1.524 m)   Wt 60.8 kg   SpO2 98%   BMI 26.17 kg/m   Physical Exam Vitals and nursing note reviewed.  Constitutional:        General: She is not in acute distress.    Appearance: Normal appearance. She is well-developed.  HENT:     Head: Normocephalic and atraumatic.     Right Ear: Hearing normal.     Left Ear: Hearing normal.     Nose: Nose normal.  Eyes:     Conjunctiva/sclera: Conjunctivae normal.     Pupils: Pupils are equal, round, and reactive to light.  Cardiovascular:     Rate and Rhythm: Regular rhythm.     Heart sounds: S1 normal and S2 normal. No murmur. No friction rub. No gallop.   Pulmonary:     Effort: Pulmonary effort is normal. No respiratory distress.     Breath sounds: Wheezing present.  Chest:     Chest wall: No tenderness.  Abdominal:     General: Bowel sounds are normal.     Palpations: Abdomen is soft.     Tenderness: There is no abdominal tenderness. There is no guarding or rebound. Negative signs include Murphy's sign and McBurney's sign.     Hernia: No hernia is present.  Musculoskeletal:        General: Normal range of motion.     Cervical back: Normal range of motion and neck supple.  Skin:    General: Skin is warm and dry.     Findings: No rash.  Neurological:     Mental Status: She is alert and oriented to person, place, and time.     GCS: GCS eye subscore is 4. GCS verbal subscore is 5. GCS motor subscore is 6.     Cranial Nerves: No cranial nerve deficit.     Sensory: No sensory deficit.     Coordination: Coordination normal.  Psychiatric:        Speech: Speech normal.        Behavior: Behavior normal.        Thought Content: Thought content normal.     ED Results / Procedures / Treatments   Labs (all labs  ordered are listed, but only abnormal results are displayed) Labs Reviewed  CBC - Abnormal; Notable for the following components:      Result Value   WBC 15.0 (*)    RBC 5.13 (*)    All other components within normal limits  BASIC METABOLIC PANEL - Abnormal; Notable for the following components:   Potassium 3.2 (*)    Chloride 91 (*)    CO2 35 (*)     Glucose, Bld 289 (*)    Creatinine, Ser 0.41 (*)    All other components within normal limits    EKG None  Radiology No results found.  Procedures Procedures (including critical care time)  Medications Ordered in ED Medications  sodium chloride 0.9 % bolus 500 mL (500 mLs Intravenous New Bag/Given 04/18/20 0147)  ketorolac (TORADOL) 30 MG/ML injection 15 mg (15 mg Intravenous Given 04/18/20 0147)  prochlorperazine (COMPAZINE) injection 10 mg (10 mg Intravenous Given 04/18/20 0142)  diphenhydrAMINE (BENADRYL) injection 25 mg (25 mg Intravenous Given 04/18/20 0148)    ED Course  I have reviewed the triage vital signs and the nursing notes.  Pertinent labs & imaging results that were available during my care of the patient were reviewed by me and considered in my medical decision making (see chart for details).    MDM Rules/Calculators/A&P                      Patient presents to the emergency department for evaluation of migraine headache.  She had onset of headache just prior to arrival.  She tried her typical rescue Imitrex but did not get any relief.  She did not have any focal findings on examination.  This was felt to be atypical migraine.  She was treated with migraine cocktail with significant improvement.  Final Clinical Impression(s) / ED Diagnoses Final diagnoses:  Migraine without status migrainosus, not intractable, unspecified migraine type    Rx / DC Orders ED Discharge Orders    None       Rossi Silvestro, Gwenyth Allegra, MD 04/18/20 [phone]

## 2020-04-18 NOTE — ED Triage Notes (Signed)
Pt states has a migraine that started approximately 2330. Took 2 Imitrex as prescribed and vomited the medicine up.

## 2020-04-21 ENCOUNTER — Emergency Department (HOSPITAL_COMMUNITY): Payer: Medicaid Other

## 2020-04-21 ENCOUNTER — Emergency Department (HOSPITAL_COMMUNITY)
Admission: EM | Admit: 2020-04-21 | Discharge: 2020-04-21 | Disposition: A | Discharge status: Home / Self Care | Payer: Medicaid Other | Attending: Emergency Medicine | Admitting: Emergency Medicine

## 2020-04-21 ENCOUNTER — Other Ambulatory Visit: Payer: Self-pay

## 2020-04-21 ENCOUNTER — Encounter (HOSPITAL_COMMUNITY): Payer: Self-pay

## 2020-04-21 DIAGNOSIS — R519 Headache, unspecified: Secondary | ICD-10-CM | POA: Insufficient documentation

## 2020-04-21 DIAGNOSIS — I10 Essential (primary) hypertension: Secondary | ICD-10-CM | POA: Diagnosis not present

## 2020-04-21 DIAGNOSIS — J441 Chronic obstructive pulmonary disease with (acute) exacerbation: Secondary | ICD-10-CM | POA: Diagnosis not present

## 2020-04-21 DIAGNOSIS — Z20822 Contact with and (suspected) exposure to covid-19: Secondary | ICD-10-CM | POA: Insufficient documentation

## 2020-04-21 DIAGNOSIS — K219 Gastro-esophageal reflux disease without esophagitis: Secondary | ICD-10-CM | POA: Insufficient documentation

## 2020-04-21 DIAGNOSIS — Z794 Long term (current) use of insulin: Secondary | ICD-10-CM | POA: Diagnosis not present

## 2020-04-21 DIAGNOSIS — F17210 Nicotine dependence, cigarettes, uncomplicated: Secondary | ICD-10-CM | POA: Insufficient documentation

## 2020-04-21 DIAGNOSIS — Z79899 Other long term (current) drug therapy: Secondary | ICD-10-CM | POA: Diagnosis not present

## 2020-04-21 DIAGNOSIS — E119 Type 2 diabetes mellitus without complications: Secondary | ICD-10-CM | POA: Diagnosis not present

## 2020-04-21 DIAGNOSIS — R0602 Shortness of breath: Secondary | ICD-10-CM

## 2020-04-21 DIAGNOSIS — R1084 Generalized abdominal pain: Secondary | ICD-10-CM | POA: Insufficient documentation

## 2020-04-21 DIAGNOSIS — R110 Nausea: Secondary | ICD-10-CM | POA: Insufficient documentation

## 2020-04-21 LAB — URINALYSIS, ROUTINE W REFLEX MICROSCOPIC
Bacteria, UA: NONE SEEN
Bilirubin Urine: NEGATIVE
Glucose, UA: >=500 mg/dL — AB
Ketones, ur: NEGATIVE mg/dL
Leukocytes,Ua: NEGATIVE
Nitrite: NEGATIVE
Protein, ur: NEGATIVE mg/dL
Specific Gravity, Urine: 1.014 (ref 1.005–1.030)
pH: 5 (ref 5.0–8.0)

## 2020-04-21 LAB — COMPREHENSIVE METABOLIC PANEL
ALT: 8 U/L (ref 0–44)
AST: 13 U/L — ABNORMAL LOW (ref 15–41)
Albumin: 4 g/dL (ref 3.5–5.0)
Alkaline Phosphatase: 81 U/L (ref 38–126)
Anion gap: 12 (ref 5–15)
BUN: 8 mg/dL (ref 6–20)
CO2: 31 mmol/L (ref 22–32)
Calcium: 9.2 mg/dL (ref 8.9–10.3)
Chloride: 94 mmol/L — ABNORMAL LOW (ref 98–111)
Creatinine, Ser: 0.49 mg/dL (ref 0.44–1.00)
GFR calc Af Amer: >60 mL/min (ref >60)
GFR calc non Af Amer: >60 mL/min (ref >60)
Glucose, Bld: 287 mg/dL — ABNORMAL HIGH (ref 70–99)
Potassium: 3.3 mmol/L — ABNORMAL LOW (ref 3.5–5.1)
Sodium: 137 mmol/L (ref 135–145)
Total Bilirubin: 0.2 mg/dL — ABNORMAL LOW (ref 0.3–1.2)
Total Protein: 6.6 g/dL (ref 6.5–8.1)

## 2020-04-21 LAB — CBC WITH DIFFERENTIAL/PLATELET
Abs Immature Granulocytes: 0.06 10*3/uL (ref 0.00–0.07)
Basophils Absolute: 0.1 10*3/uL (ref 0.0–0.1)
Basophils Relative: 0 %
Eosinophils Absolute: 0.2 10*3/uL (ref 0.0–0.5)
Eosinophils Relative: 1 %
HCT: 38 % (ref 36.0–46.0)
Hemoglobin: 12.4 g/dL (ref 12.0–15.0)
Immature Granulocytes: 0 %
Lymphocytes Relative: 13 %
Lymphs Abs: 2.3 10*3/uL (ref 0.7–4.0)
MCH: 27.5 pg (ref 26.0–34.0)
MCHC: 32.6 g/dL (ref 30.0–36.0)
MCV: 84.3 fL (ref 80.0–100.0)
Monocytes Absolute: 1 10*3/uL (ref 0.1–1.0)
Monocytes Relative: 6 %
Neutro Abs: 13.6 10*3/uL — ABNORMAL HIGH (ref 1.7–7.7)
Neutrophils Relative %: 80 %
Platelets: 306 10*3/uL (ref 150–400)
RBC: 4.51 MIL/uL (ref 3.87–5.11)
RDW: 12 % (ref 11.5–15.5)
WBC: 17.1 10*3/uL — ABNORMAL HIGH (ref 4.0–10.5)
nRBC: 0 % (ref 0.0–0.2)

## 2020-04-21 LAB — BRAIN NATRIURETIC PEPTIDE: B Natriuretic Peptide: 48 pg/mL (ref 0.0–100.0)

## 2020-04-21 LAB — LACTIC ACID, PLASMA
Lactic Acid, Venous: 1.5 mmol/L (ref 0.5–1.9)
Lactic Acid, Venous: 1.2 mmol/L (ref 0.5–1.9)

## 2020-04-21 LAB — SARS CORONAVIRUS 2 BY RT PCR (HOSPITAL ORDER, PERFORMED IN ~~LOC~~ HOSPITAL LAB): SARS Coronavirus 2: NEGATIVE

## 2020-04-21 LAB — TROPONIN I (HIGH SENSITIVITY)
Troponin I (High Sensitivity): 3 ng/L (ref <18)
Troponin I (High Sensitivity): 3 ng/L (ref <18)

## 2020-04-21 LAB — MAGNESIUM: Magnesium: 1.5 mg/dL — ABNORMAL LOW (ref 1.7–2.4)

## 2020-04-21 MED ORDER — ONDANSETRON HCL 4 MG/2ML IJ SOLN: 4.0000 mg | Freq: Once | INTRAMUSCULAR | Status: DC

## 2020-04-21 MED ORDER — IPRATROPIUM BROMIDE HFA 17 MCG/ACT IN AERS: 2.0000 | INHALATION_SPRAY | Freq: Once | RESPIRATORY_TRACT | Status: DC

## 2020-04-21 MED ORDER — IOHEXOL 350 MG/ML SOLN
100.0000 mL | Freq: Once | INTRAVENOUS | Status: AC | PRN
  Administered 2020-04-21: 100 mL via INTRAVENOUS

## 2020-04-21 MED ORDER — IPRATROPIUM-ALBUTEROL 20-100 MCG/ACT IN AERS
2.0000 | INHALATION_SPRAY | Freq: Four times a day (QID) | RESPIRATORY_TRACT | Status: DC
  Administered 2020-04-21: 2 via RESPIRATORY_TRACT
  Filled 2020-04-21: qty 4

## 2020-04-21 MED ORDER — SODIUM CHLORIDE 0.9 % IV BOLUS
1000.0000 mL | Freq: Once | INTRAVENOUS | Status: AC
  Administered 2020-04-21: 1000 mL via INTRAVENOUS

## 2020-04-21 MED ORDER — POTASSIUM CHLORIDE CRYS ER 20 MEQ PO TBCR
20.0000 meq | EXTENDED_RELEASE_TABLET | Freq: Once | ORAL | Status: AC
  Administered 2020-04-21: 20 meq via ORAL
  Filled 2020-04-21: qty 1

## 2020-04-21 MED ORDER — ALBUTEROL SULFATE HFA 108 (90 BASE) MCG/ACT IN AERS
6.0000 | INHALATION_SPRAY | Freq: Once | RESPIRATORY_TRACT | Status: DC
  Filled 2020-04-21: qty 6.7

## 2020-04-21 MED ORDER — MAGNESIUM SULFATE 2 GM/50ML IV SOLN
2.0000 g | Freq: Once | INTRAVENOUS | Status: AC
  Administered 2020-04-21: 2 g via INTRAVENOUS
  Filled 2020-04-21: qty 50

## 2020-04-21 MED ORDER — AZITHROMYCIN 250 MG PO TABS
250.0000 mg | ORAL_TABLET | Freq: Every day | ORAL | 0 refills | Status: DC
Start: 2020-04-21 — End: 2020-05-08

## 2020-04-21 MED ORDER — ACETAMINOPHEN 500 MG PO TABS
1000.0000 mg | ORAL_TABLET | Freq: Once | ORAL | Status: AC
  Administered 2020-04-21: 1000 mg via ORAL
  Filled 2020-04-21: qty 2

## 2020-04-21 MED ORDER — PREDNISONE 10 MG (21) PO TBPK
ORAL_TABLET | ORAL | 0 refills | Status: DC
Start: 2020-04-21 — End: 2020-04-23

## 2020-04-21 MED ORDER — METOCLOPRAMIDE HCL 5 MG/ML IJ SOLN
10.0000 mg | Freq: Once | INTRAMUSCULAR | Status: AC
  Administered 2020-04-21: 10 mg via INTRAVENOUS
  Filled 2020-04-21: qty 2

## 2020-04-21 MED ORDER — MOMETASONE FUROATE 50 MCG/ACT NA SUSP
2.0000 | Freq: Every day | NASAL | 0 refills | Status: DC
Start: 2020-04-21 — End: 2020-04-23

## 2020-04-21 MED ORDER — METHYLPREDNISOLONE SODIUM SUCC 125 MG IJ SOLR
125.0000 mg | Freq: Once | INTRAMUSCULAR | Status: AC
  Administered 2020-04-21: 125 mg via INTRAVENOUS
  Filled 2020-04-21: qty 2

## 2020-04-21 NOTE — Discharge Instructions (Addendum)
°  Prednisone: Take the prednisone, as prescribed, until finished. If you are a diabetic, please know prednisone can raise your blood sugar temporarily.  Begin taking the antibiotic right away. Please take all of your antibiotics until finished!   You may develop abdominal discomfort or diarrhea from the antibiotic.  You may help offset this with probiotics which you can buy or get in yogurt. Do not eat or take the probiotics until 2 hours after your antibiotic.   Nasonex: Begin using this nose spray instead of the Flonase.  Follow-up: Follow-up with the following: Primary care provider, ear nose and throat (ENT), and pulmonology, as planned.  Return: Return to emergency department for worsening shortness of breath, chest pain, passing out, abdominal pain, or any other major concerns.

## 2020-04-21 NOTE — ED Notes (Signed)
Pt states she is having difficulty breathing. O2 sats 98%. EDP informed and states will come assess pt.

## 2020-04-21 NOTE — ED Triage Notes (Signed)
Pt brought in by EMS. Reports she has been SOB since this morning. EMS reports pt was wheezing and was given 5 mg albuterol. sats on RA 97%

## 2020-04-21 NOTE — ED Notes (Signed)
Pt transported to CT ?

## 2020-04-21 NOTE — ED Provider Notes (Signed)
[facility] EMERGENCY DEPARTMENT Provider Note   CSN: [id number] Arrival date & time: 04/21/20  1406     History Chief Complaint  Patient presents with  . Shortness of Breath    [name] is a 54 y.o. female.  HPI      [name] is a 55 y.o. female, with a history of anxiety, ARDS, asthma, COPD, DM, GERD, HTN, migraines, presenting to the ED with shortness of breath beginning this morning.  Patient states she had worse shortness of breath than usual upon waking this morning. EMS gave 2 albuterol nebulizer treatments with improvement voiced by the patient. Patient also endorses a bilateral frontal headache beginning this morning as well.  She has had similar headaches in the past and states this headache feels like previous migraines.  Pain is throbbing, moderate to severe, nonradiating. Accompanied by nausea.  Denies known fever/chills, vomiting, diarrhea, abdominal pain, chest pain, dizziness, syncope, neck pain, new cough, lower extremity edema/pain, diarrhea, orthopnea, or any other complaints.    Past Medical History:  Diagnosis Date  . Angina   . Anxiety state 10/15/2015  . ARDS (adult respiratory distress syndrome) [facility])    Jan 2011  . Asthma   . Chronic back pain   . COPD (chronic obstructive pulmonary disease) (HCC)   . Diabetes mellitus   . GERD (gastroesophageal reflux disease) 12/21/2012  . HTN (hypertension) 10/15/2015  . Hyperglycemia, drug-induced    steroid induced hyperglycemia  . Migraine headache   . On home O2   . Pneumonia   . Recurrent upper respiratory infection (URI)   . Shortness of breath     Patient Active Problem List   Diagnosis Date Noted  . Hoarseness or changing voice 01/11/2020  . Nausea and vomiting 01/11/2020  . Acute on chronic respiratory failure with hypoxia and hypercapnia (HCC) 11/27/2019  . Chronic pain syndrome 11/27/2019  . Lobar pneumonia (HCC) 11/27/2019  . Atypical pneumonia 09/28/2016  . Chronic obstructive  pulmonary disease (HCC)   . Pneumonia 05/30/2016  . SIRS (systemic inflammatory response syndrome) (HCC) 10/16/2015  . Sepsis (HCC) 10/15/2015  . Anxiety state 10/15/2015  . HTN (hypertension) 10/15/2015  . Hypokalemia 10/15/2015  . COPD with acute exacerbation (HCC) 08/14/2014  . Acute-on-chronic respiratory failure (HCC) 03/13/2014  . CAP (community acquired pneumonia) 03/11/2014  . Community acquired pneumonia 03/11/2014  . COPD exacerbation (HCC) 04/22/2013  . GERD (gastroesophageal reflux disease) 12/21/2012  . Constipation 12/21/2012  . Healthcare-associated pneumonia 02/03/2012  . Candida infection 11/26/2011  . Viral syndrome 11/21/2011  . DM (diabetes mellitus) (HCC) 11/21/2011  . Thrush, oral 07/03/2011  . Low back pain 06/16/2011  . Muscle spasms of lower extremity 06/16/2011  . TOBACCO ABUSE 09/12/2010  . 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 COPD (chronic obstructive pulmonary disease) (HCC) 02/14/2010  . UNSPECIFIED NUTRITIONAL DEFICIENCY 02/13/2010  . ADULT RESPIRATORY DISTRESS SYNDROME 02/13/2010  . DELIRIUM 02/13/2010    Past Surgical History:  Procedure Laterality Date  . c-section    . TRACHEOSTOMY     decannulated 12/2009  . TUBAL LIGATION    . uterine ablasion       OB History   No obstetric history on file.     Family History  Problem Relation Age of Onset  . Coronary artery disease Brother   . Diabetes Other   . Cancer Other   . Hypertension Other     Social History   Tobacco Use  . Smoking status: Current Every  Day Smoker    Packs/day: 1.00    Years: 33.00    Pack years: 33.00    Types: Cigarettes  . Smokeless tobacco: Never Used  . Tobacco comment: 1/2 pack a day  since age 89  Substance Use Topics  . Alcohol use: No  . Drug use: No    Comment: Formerly used cocaine , narcotics, THC    Home Medications Prior to Admission medications   Medication Sig Start Date End Date Taking? Authorizing Provider    acetaminophen (TYLENOL) 500 MG tablet Take 500 mg by mouth every 6 (six) hours as needed for fever.     [provider]  albuterol (PROAIR HFA) 108 (90 Base) MCG/ACT inhaler Inhale 2 puffs into the lungs every 6 (six) hours as needed. For shortness of breath 03/11/20   Wandra Feinsteinorum, Lisa L, MD  albuterol (PROVENTIL) (2.5 MG/3ML) 0.083% nebulizer solution Take 3 mLs (2.5 mg total) by nebulization every 4 (four) hours as needed for wheezing or shortness of breath. For shortness of breath 02/08/20   Wandra Feinsteinorum, Lisa L, MD  azithromycin (ZITHROMAX) 250 MG tablet Take 1 tablet (250 mg total) by mouth daily. Take first 2 tablets together, then 1 every day until finished. 04/21/20   Addalie Calles C, PA-C  budesonide-formoterol (SYMBICORT) 160-4.5 MCG/ACT inhaler Inhale 2 puffs into the lungs 2 (two) times daily. 03/12/20   Corum, Minerva FesterLisa L, MD  busPIRone (BUSPAR) 5 MG tablet Take 1 tablet (5 mg total) by mouth 2 (two) times daily. 01/04/20   Corum, Minerva FesterLisa L, MD  clonazePAM (KLONOPIN) 0.5 MG tablet Take 0.5 mg by mouth 3 (three) times daily.     [provider]  fluconazole (DIFLUCAN) 100 MG tablet Take 100 mg by mouth daily.    [provider]  fluticasone (FLONASE) 50 MCG/ACT nasal spray Place 2 sprays into both nostrils daily. 04/13/18   [provider]  glucose blood test strip 1 each by Other route 3 (three) times daily. Use as instructed, accucheck test strips 01/04/20   Corum, Minerva FesterLisa L, MD  guaiFENesin (MUCINEX) 600 MG 12 hr tablet Take 1 tablet (600 mg total) by mouth 2 (two) times daily. 11/29/19 11/28/20  Erick BlinksMemon, Jehanzeb, MD  ibuprofen (ADVIL) 600 MG tablet Take 1 tablet (600 mg total) by mouth every 6 (six) hours as needed. 09/07/19   Eber HongMiller, Brian, MD  Insulin Glargine (LANTUS) 100 UNIT/ML Solostar Pen Inject 15 Units into the skin daily. 12/02/19   Cleora FleetJohnson, Clanford L, MD  Insulin Pen Needle 31G X 5 MM MISC Use as directed 03/12/20   Wandra Feinsteinorum, Lisa L, MD  lisinopril (ZESTRIL) 20 MG tablet Take 1  tablet (20 mg total) by mouth daily. 03/11/20   Corum, Minerva FesterLisa L, MD  LYRICA 100 MG capsule Take 100 mg by mouth 2 (two) times daily. 05/02/18   [provider]  metFORMIN (GLUCOPHAGE) 500 MG tablet Take 2 tablets (1,000 mg total) by mouth 2 (two) times daily. 01/04/20   Corum, Minerva FesterLisa L, MD  mometasone (NASONEX) 50 MCG/ACT nasal spray Place 2 sprays into the nose daily. 04/21/20   Crysten Kaman C, PA-C  nystatin (MYCOSTATIN) 100000 UNIT/ML suspension Take 5 mLs (500,000 Units total) by mouth 4 (four) times daily. 01/04/20   Corum, Minerva FesterLisa L, MD  ondansetron (ZOFRAN ODT) 4 MG disintegrating tablet Take 1 tablet (4 mg total) by mouth every 8 (eight) hours as needed for nausea or vomiting. 02/09/19   Terrilee FilesButler, Michael C, MD  OxyCODONE (OXYCONTIN) 10 mg T12A Take 1  tablet (10 mg total) by mouth every 12 (twelve) hours. 04/24/13   Kari Baars, MD  Oxycodone HCl 10 MG TABS Take 10 mg by mouth 3 (three) times daily.    [provider]  pantoprazole (PROTONIX) 40 MG tablet Take 1 tablet (40 mg total) by mouth daily. 03/11/20   Corum, Minerva Fester, MD  predniSONE (STERAPRED UNI-PAK 21 TAB) 10 MG (21) TBPK tablet Take 6 tabs (60mg ) day 1, 5 tabs (50mg ) day 2, 4 tabs (40mg ) day 3, 3 tabs (30mg ) day 4, 2 tabs (20mg ) day 5, and 1 tab (10mg ) day 6. 04/21/20   Belinda Schlichting C, PA-C  pregabalin (LYRICA) 100 MG capsule Take 100 mg by mouth 3 (three) times daily.    [provider]  promethazine (PHENERGAN) 25 MG tablet TAKE 1 TABLET BY MOUTH EVERY 6 HOURS AS NEEDED FOR NAUSEA 02/13/20   Corum, , MD  SUMAtriptan (IMITREX) 50 MG tablet Take 1 tablet (50 mg total) by mouth daily as needed for migraine or headache. 03/11/20   , MD  tiotropium (SPIRIVA) 18 MCG inhalation capsule Place 1 capsule (18 mcg total) into inhaler and inhale daily. 03/11/20   Corum, 04/23/20, MD  ARIPiprazole (ABILIFY) 5 MG tablet Take 5 mg by mouth daily.    02/03/12  [provider]  venlafaxine (EFFEXOR) 75 MG tablet Take 75  mg by mouth daily.    02/03/12  [provider]    Allergies    Penicillins, Levaquin [levofloxacin in d5w], Doxycycline, and Morphine  Review of Systems   Review of Systems  Constitutional: Negative for chills, diaphoresis and fever.  Respiratory: Positive for shortness of breath and wheezing. Negative for cough.   Cardiovascular: Negative for chest pain and leg swelling.  Gastrointestinal: Positive for nausea. Negative for abdominal pain, diarrhea and vomiting.  Genitourinary: Negative for dysuria, frequency and hematuria.  Musculoskeletal: Negative for back pain.  Neurological: Positive for headaches. Negative for dizziness, syncope, weakness and numbness.  All other systems reviewed and are negative.   Physical Exam Updated Vital Signs BP (!) 169/82   Pulse (!) 107   Temp 99.4 F (37.4 C) (Oral)   Resp (!) 23   Ht 5' (1.524 m)   Wt 60 kg   SpO2 96%   BMI 25.83 kg/m   Physical Exam Vitals and nursing note reviewed.  Constitutional:      General: She is not in acute distress.    Appearance: She is well-developed. She is not diaphoretic.  HENT:     Head: Normocephalic and atraumatic.     Mouth/Throat:     Mouth: Mucous membranes are moist.     Pharynx: Oropharynx is clear.  Eyes:     Conjunctiva/sclera: Conjunctivae normal.  Cardiovascular:     Rate and Rhythm: Normal rate and regular rhythm.     Pulses: Normal pulses.          Radial pulses are 2+ on the right side and 2+ on the left side.       Posterior tibial pulses are 2+ on the right side and 2+ on the left side.     Heart sounds: Normal heart sounds.     Comments: Tactile temperature in the extremities appropriate and equal bilaterally. Pulmonary:     Effort: Tachypnea and respiratory distress present.     Breath sounds: Wheezing and rhonchi present.     Comments: Increased work of breathing with tachypnea.  SPO2 at 100% on patient's home O2  of 3 L. Abdominal:     Palpations: Abdomen is soft.      Tenderness: There is no abdominal tenderness. There is no guarding.  Musculoskeletal:     Cervical back: Neck supple.     Right lower leg: No edema.     Left lower leg: No edema.  Lymphadenopathy:     Cervical: No cervical adenopathy.  Skin:    General: Skin is warm and dry.  Neurological:     Mental Status: She is alert.     Comments: No noted acute cognitive deficit. Sensation grossly intact to light touch in the extremities.   Grip strengths equal bilaterally.   Strength 5/5 in all extremities.  Coordination intact.  Cranial nerves III-XII grossly intact.  Handles oral secretions without noted difficulty.  No noted phonation or speech deficit. No facial droop.   Psychiatric:        Mood and Affect: Mood and affect normal.        Speech: Speech normal.        Behavior: Behavior normal.     ED Results / Procedures / Treatments   Labs (all labs ordered are listed, but only abnormal results are displayed) Labs Reviewed  CBC WITH DIFFERENTIAL/PLATELET - Abnormal; Notable for the following components:      Result Value   WBC 17.1 (*)    Neutro Abs 13.6 (*)    All other components within normal limits  COMPREHENSIVE METABOLIC PANEL - Abnormal; Notable for the following components:   Potassium 3.3 (*)    Chloride 94 (*)    Glucose, Bld 287 (*)    AST 13 (*)    Total Bilirubin 0.2 (*)    All other components within normal limits  URINALYSIS, ROUTINE W REFLEX MICROSCOPIC - Abnormal; Notable for the following components:   Glucose, UA >=500 (*)    Hgb urine dipstick SMALL (*)    All other components within normal limits  MAGNESIUM - Abnormal; Notable for the following components:   Magnesium 1.5 (*)    All other components within normal limits  SARS CORONAVIRUS 2 BY RT PCR (HOSPITAL ORDER, PERFORMED IN [facility])  URINE CULTURE  BRAIN NATRIURETIC PEPTIDE  LACTIC ACID, PLASMA  LACTIC ACID, PLASMA  TROPONIN I (HIGH SENSITIVITY)  TROPONIN I (HIGH  SENSITIVITY)    EKG EKG Interpretation  Date/Time:  Sunday Apr 21 2020 14:33:13 EDT Ventricular Rate:  90 PR Interval:    QRS Duration: 124 QT Interval:  394 QTC Calculation: 483 R Axis:   70 Text Interpretation: Sinus rhythm Left atrial enlargement Right bundle branch block Borderline ST elevation, lateral leads since last tracing no significant change Confirmed by Eber Hong ([id number]) on 04/21/2020 2:50:08 PM   Radiology DG Chest 2 View  Result Date: 04/21/2020 CLINICAL DATA:  Shortness of breath EXAM: CHEST - 2 VIEW COMPARISON:  May 26, 2020 FINDINGS: There is interstitial thickening throughout the lungs. There is no edema or airspace opacity. Heart size and pulmonary vascularity are normal. No adenopathy. No bone lesions. IMPRESSION: Interstitial thickening in the lungs, likely reflecting underlying chronic inflammatory type change. No frank edema or airspace opacity. Cardiac silhouette within normal limits. No adenopathy. Electronically Signed   By: Bretta Bang III M.D.   On: 04/21/2020 15:25   CT Head Wo Contrast  Result Date: 04/21/2020 CLINICAL DATA:  Headache, acute, normal neuro exam EXAM: CT HEAD WITHOUT CONTRAST TECHNIQUE: Contiguous axial images were obtained from the base of the skull through the  vertex without intravenous contrast. COMPARISON:  Head CT 06/06/2015 FINDINGS: Brain: No intracranial hemorrhage, mass effect, or midline shift. No hydrocephalus. The basilar cisterns are patent. No evidence of territorial infarct or acute ischemia. No extra-axial or intracranial fluid collection. Vascular: No hyperdense vessel or unexpected calcification. Skull: No fracture or focal lesion. Sinuses/Orbits: Prior right mastoidectomy. Left mastoid air cells are clear. Unchanged fluid level in the left maxillary sinus from prior exam. Scattered mucosal thickening of ethmoid air cells. No evidence of acute sinus disease. Visualized orbits are unremarkable. Other: None. IMPRESSION: 1.  No acute intracranial abnormality. 2. Chronic fluid level in the left maxillary sinus, unchanged from 2016. Electronically Signed   By: Narda Rutherford M.D.   On: 04/21/2020 17:43   CT Angio Chest PE W and/or Wo Contrast  Result Date: 04/21/2020 CLINICAL DATA:  54 year old female with acute chest, abdominal flank pain for several weeks. Acute shortness of breath and wheezing today. EXAM: CT ANGIOGRAPHY CHEST CT ABDOMEN AND PELVIS WITH CONTRAST TECHNIQUE: Multidetector CT imaging of the chest was performed using the standard protocol during bolus administration of intravenous contrast. Multiplanar CT image reconstructions and MIPs were obtained to evaluate the vascular anatomy. Multidetector CT imaging of the abdomen and pelvis was performed using the standard protocol during bolus administration of intravenous contrast. CONTRAST:  OMNIPAQUE IOHEXOL 350 MG/ML SOLN COMPARISON:  11/27/2019 CTs and prior studies FINDINGS: CTA CHEST FINDINGS Cardiovascular: This is a technically satisfactory study. No pulmonary emboli are identified. Heart size is normal. No thoracic aortic aneurysm or pericardial effusion. Mediastinum/Nodes: Mildly enlarged bilateral mediastinal and hilar lymph nodes are again identified and not significantly changed from 2017. No increasing lymphadenopathy is identified. No mediastinal mass is noted. The visualized trachea, esophagus and thyroid are unremarkable. Lungs/Pleura: Previously noted diffuse tree in bud and nodular opacities throughout both lungs have nearly completely resolved. Mild paraseptal and centrilobular emphysema is noted. No airspace disease, mass, pleural effusion or pneumothorax identified. Musculoskeletal: No acute or suspicious bony abnormalities noted. Review of the MIP images confirms the above findings. CT ABDOMEN and PELVIS FINDINGS Hepatobiliary: Gallbladder distension is again noted. Mild CBD dilatation to the ampulla and mild intrahepatic biliary dilatation are  unchanged from 2017. No other hepatic abnormalities are noted. Pancreas: Unremarkable Spleen: Unremarkable Adrenals/Urinary Tract: The kidneys, adrenal glands and bladder are unremarkable except for small LEFT renal cysts. Stomach/Bowel: Stomach is within normal limits. Appendix appears normal. No evidence of bowel wall thickening, distention, or inflammatory changes. Vascular/Lymphatic: No significant vascular findings are present. No enlarged abdominal or pelvic lymph nodes. Reproductive: Uterus and bilateral adnexa are unremarkable. Other: No ascites, focal collection or pneumoperitoneum. Musculoskeletal: No acute or suspicious bony abnormalities identified. Review of the MIP images confirms the above findings. IMPRESSION: 1. No evidence of acute abnormality. No pulmonary emboli or thoracic aortic aneurysm. 2. Near complete resolution of tree-in-bud/nodular opacities within both lungs since 11/27/2019. 3. Unchanged mildly enlarged bilateral mediastinal and hilar lymph nodes - likely reactive and could be a manifestation of sarcoid. 4. Unchanged gallbladder distension, mild CBD dilatation to the ampulla and mild intrahepatic biliary dilatation since 2017. 5.  Emphysema (ICD10-J43.9). Electronically Signed   By: Harmon Pier M.D.   On: 04/21/2020 17:52   CT ABDOMEN PELVIS W CONTRAST  Result Date: 04/21/2020 CLINICAL DATA:  54 year old female with acute chest, abdominal flank pain for several weeks. Acute shortness of breath and wheezing today. EXAM: CT ANGIOGRAPHY CHEST CT ABDOMEN AND PELVIS WITH CONTRAST TECHNIQUE: Multidetector CT imaging of the chest was performed  using the standard protocol during bolus administration of intravenous contrast. Multiplanar CT image reconstructions and MIPs were obtained to evaluate the vascular anatomy. Multidetector CT imaging of the abdomen and pelvis was performed using the standard protocol during bolus administration of intravenous contrast. CONTRAST:  OMNIPAQUE  IOHEXOL 350 MG/ML SOLN COMPARISON:  11/27/2019 CTs and prior studies FINDINGS: CTA CHEST FINDINGS Cardiovascular: This is a technically satisfactory study. No pulmonary emboli are identified. Heart size is normal. No thoracic aortic aneurysm or pericardial effusion. Mediastinum/Nodes: Mildly enlarged bilateral mediastinal and hilar lymph nodes are again identified and not significantly changed from 2017. No increasing lymphadenopathy is identified. No mediastinal mass is noted. The visualized trachea, esophagus and thyroid are unremarkable. Lungs/Pleura: Previously noted diffuse tree in bud and nodular opacities throughout both lungs have nearly completely resolved. Mild paraseptal and centrilobular emphysema is noted. No airspace disease, mass, pleural effusion or pneumothorax identified. Musculoskeletal: No acute or suspicious bony abnormalities noted. Review of the MIP images confirms the above findings. CT ABDOMEN and PELVIS FINDINGS Hepatobiliary: Gallbladder distension is again noted. Mild CBD dilatation to the ampulla and mild intrahepatic biliary dilatation are unchanged from 2017. No other hepatic abnormalities are noted. Pancreas: Unremarkable Spleen: Unremarkable Adrenals/Urinary Tract: The kidneys, adrenal glands and bladder are unremarkable except for small LEFT renal cysts. Stomach/Bowel: Stomach is within normal limits. Appendix appears normal. No evidence of bowel wall thickening, distention, or inflammatory changes. Vascular/Lymphatic: No significant vascular findings are present. No enlarged abdominal or pelvic lymph nodes. Reproductive: Uterus and bilateral adnexa are unremarkable. Other: No ascites, focal collection or pneumoperitoneum. Musculoskeletal: No acute or suspicious bony abnormalities identified. Review of the MIP images confirms the above findings. IMPRESSION: 1. No evidence of acute abnormality. No pulmonary emboli or thoracic aortic aneurysm. 2. Near complete resolution of  tree-in-bud/nodular opacities within both lungs since 11/27/2019. 3. Unchanged mildly enlarged bilateral mediastinal and hilar lymph nodes - likely reactive and could be a manifestation of sarcoid. 4. Unchanged gallbladder distension, mild CBD dilatation to the ampulla and mild intrahepatic biliary dilatation since 2017. 5.  Emphysema (ICD10-J43.9). Electronically Signed   By: Harmon Pier M.D.   On: 04/21/2020 17:52    Procedures Procedures (including critical care time)  Medications Ordered in ED Medications  Ipratropium-Albuterol (COMBIVENT) respimat 2 puff (2 puffs Inhalation Given 04/21/20 1519)  methylPREDNISolone sodium succinate (SOLU-MEDROL) 125 mg/2 mL injection 125 mg (125 mg Intravenous Given 04/21/20 1456)  metoCLOPramide (REGLAN) injection 10 mg (10 mg Intravenous Given 04/21/20 1455)  sodium chloride 0.9 % bolus 1,000 mL (0 mLs Intravenous Stopped 04/21/20 1614)  acetaminophen (TYLENOL) tablet 1,000 mg (1,000 mg Oral Given 04/21/20 1537)  potassium chloride SA (KLOR-CON) CR tablet 20 mEq (20 mEq Oral Given 04/21/20 1735)  magnesium sulfate IVPB 2 g 50 mL (0 g Intravenous Stopped 04/21/20 1751)  iohexol (OMNIPAQUE) 350 MG/ML injection 100 mL (100 mLs Intravenous Contrast Given 04/21/20 1717)    ED Course  I have reviewed the triage vital signs and the nursing notes.  Pertinent labs & imaging results that were available during my care of the patient were reviewed by me and considered in my medical decision making (see chart for details).  Clinical Course as of Apr 21 1936  Wynelle Link Apr 21, 2020  1447 Pharmacist states ipratropium is unavailable.  Combivent may be used, if necessary.   [SJ]  1633 Patient reevaluated.  She still has some tachypnea and conversational dyspnea, however, she does seem to have improved from her initial presentation.  Patient agrees  that she feels as though her breathing has improved. Her headache, however, does not seem to have improved and now she states this  headache is different than previous headaches. Her abdomen continues to be generally tender.   [SJ]  1900 Patient reevaluated for the final time.  She states she feels much better.  Her breathing and headache has improved significantly. At this point, we discussed disposition.  She states she is comfortable with discharge and will return if she starts to feel differently.   [SJ]    Clinical Course User Index [SJ] Cayleigh Paull, Hillard Danker, PA-C   MDM Rules/Calculators/A&P                       Patient presents with shortness of breath as well as headache.  Increased work of breathing and tachypnea, however, no hypoxia during ED course. Afebrile.  She only became tachycardic after the albuterol and Combivent.  Patient continued to feel short of breath.  She ultimately stated that she was concerned because this difficulty breathing does not feel like COPD exacerbation she has experienced in the past.  Borderline/mildly tachycardic.  Continues to deny chest pain. As for her headache, patient stated originally that she thought she was having a migraine.  She was seen for headache 3 days ago and said the same thing.  She stated this particular headache that she is experiencing now began this morning.  Upon repeat exam, patient then told me her headache was "just so bad I just do not understand it."  CT head was ordered. Initially downplayed abdominal pain.  She denied abdominal pain.  She had some tenderness on exam, however, told me that "it is not really anything to focus on." However, when she still had generalized tenderness on subsequent exam, I placed order for CT. Through all of this, there is the issue of persistent and slightly worsened leukocytosis over the last several days.  Patient denies prednisone use.  I personally reviewed and interpreted the patient's labs and imaging studies. CTs of the head, chest, and abdomen without acute abnormalities. Covid negative.  Mild hypokalemia and  hypomagnesemia, both addressed. Overall, patient improved during her ED course. She has an appointment with a pulmonologist on June 1.  The patient was given instructions for home care as well as return precautions. Patient voices understanding of these instructions, accepts the plan, and is comfortable with discharge.   Findings and plan of care discussed with Eber Hong, MD.    Vitals:   04/21/20 1800 04/21/20 1836 04/21/20 1846 04/21/20 1928  BP: (!) 167/88   (!) 151/78  Pulse: (!) 102 (!) 111 (!) 109 98  Resp: (!) 22 16 (!) 22 20  Temp:    98.1 F (36.7 C)  TempSrc:    Oral  SpO2: 96% 99% 100% 99%  Weight:      Height:         Final Clinical Impression(s) / ED Diagnoses Final diagnoses:  COPD exacerbation (HCC)  SOB (shortness of breath)    Rx / DC Orders ED Discharge Orders         Ordered    predniSONE (STERAPRED UNI-PAK 21 TAB) 10 MG (21) TBPK tablet     04/21/20 1913    mometasone (NASONEX) 50 MCG/ACT nasal spray  Daily     04/21/20 1913    azithromycin (ZITHROMAX) 250 MG tablet  Daily     04/21/20 1913  Lorayne Bender, PA-C 04/21/20 Lattie Corns    Noemi Chapel, MD 04/22/20 [phone]

## 2020-04-21 NOTE — ED Notes (Signed)
Pt given dinner tray.

## 2020-04-21 NOTE — ED Notes (Signed)
Respiratory called and informed need for combivent inhaler

## 2020-04-23 ENCOUNTER — Other Ambulatory Visit: Payer: Self-pay

## 2020-04-23 ENCOUNTER — Ambulatory Visit: Payer: Medicaid Other | Admitting: Internal Medicine

## 2020-04-23 ENCOUNTER — Encounter: Payer: Self-pay | Admitting: Internal Medicine

## 2020-04-23 DIAGNOSIS — J9611 Chronic respiratory failure with hypoxia: Secondary | ICD-10-CM | POA: Insufficient documentation

## 2020-04-23 DIAGNOSIS — J449 Chronic obstructive pulmonary disease, unspecified: Secondary | ICD-10-CM

## 2020-04-23 DIAGNOSIS — I10 Essential (primary) hypertension: Secondary | ICD-10-CM | POA: Diagnosis not present

## 2020-04-23 DIAGNOSIS — J9612 Chronic respiratory failure with hypercapnia: Secondary | ICD-10-CM | POA: Insufficient documentation

## 2020-04-23 DIAGNOSIS — F17210 Nicotine dependence, cigarettes, uncomplicated: Secondary | ICD-10-CM | POA: Diagnosis not present

## 2020-04-23 DIAGNOSIS — B370 Candidal stomatitis: Secondary | ICD-10-CM

## 2020-04-23 LAB — URINE CULTURE: Culture: NO GROWTH

## 2020-04-23 MED ORDER — TELMISARTAN 80 MG PO TABS
80.0000 mg | ORAL_TABLET | Freq: Every day | ORAL | 11 refills | Status: DC
Start: 2020-04-23 — End: 2020-05-03

## 2020-04-23 MED ORDER — BREZTRI AEROSPHERE 160-9-4.8 MCG/ACT IN AERO: 2.0000 | INHALATION_SPRAY | Freq: Two times a day (BID) | RESPIRATORY_TRACT | 0 refills | Status: DC

## 2020-04-23 MED ORDER — ALBUTEROL SULFATE HFA 108 (90 BASE) MCG/ACT IN AERS: 2.0000 | INHALATION_SPRAY | RESPIRATORY_TRACT | 11 refills | Status: DC | PRN

## 2020-04-23 MED ORDER — CLOTRIMAZOLE 10 MG MT TROC
10.0000 mg | Freq: Every day | OROMUCOSAL | 11 refills | Status: DC
Start: 2020-04-23 — End: 2021-08-19

## 2020-04-23 NOTE — Patient Instructions (Addendum)
Lantus should be increased to 15 units twice daily and monitor sugars at least before bfast and supper and adjust to get it less than 150   For thrush take clotrimzole 10 mg up to 5 x daily   Stop lisinopril and start micardis 80 mg (telmasartan)   GERD (REFLUX)  is an extremely common cause of respiratory symptoms just like yours , many times with no obvious heartburn at all.    It can be treated with medication, but also with lifestyle changes including elevation of the head of your bed (ideally with 6 -8inch blocks under the headboard of your bed),  Smoking cessation, avoidance of late meals, excessive alcohol, and avoid fatty foods, chocolate, peppermint, colas, red wine, and acidic juices such as orange juice.  NO MINT OR MENTHOL PRODUCTS SO NO COUGH DROPS  USE SUGARLESS CANDY INSTEAD (Jolley ranchers or Stover's or Life Savers) or even ice chips will also do - the key is to swallow to prevent all throat clearing. NO OIL BASED VITAMINS - use powdered substitutes.  Avoid fish oil when coughing.    Plan A = Automatic = Always=    Breztri Take 2 puffs first thing in am and then another 2 puffs about 12 hours later. (stop symbicort)    Work on inhaler technique:  relax and gently blow all the way out then take a nice smooth deep breath back in, triggering the inhaler at same time you start breathing in.  Hold for up to 5 seconds if you can. Blow out thru nose. Rinse and gargle with water when done   Plan B = Backup (to supplement plan A, not to replace it) Only use your albuterol inhaler (Proair) as a rescue medication to be used if you can't catch your breath by resting or doing a relaxed purse lip breathing pattern.  - The less you use it, the better it will work when you need it. - Ok to use the inhaler up to 2 puffs  every 4 hours if you must but call for appointment if use goes up over your usual need - Don't leave home without it !!  (think of it like the spare tire for your car)    Plan C = Crisis (instead of Plan B but only if Plan B stops working) - only use your albuterol nebulizer if you first try Plan B and it fails to help > ok to use the nebulizer up to every 4 hours but if start needing it regularly call for immediate appointment   Plan D = Deltasone = Prednisone - go ahead and take what the ER gave   Plan E = ER - go to ER or call 911 if all else fails     Please schedule a follow up office visit in 2 weeks, sooner if needed  with all medications /inhalers/ solutions in hand so we can verify exactly what you are taking. This includes all medications from all doctors and over the counters

## 2020-04-23 NOTE — Assessment & Plan Note (Signed)
4-5 min discussion re active cigarette smoking in addition to office E&M  Ask about tobacco use:   Ongoing as is husband Advise quitting   I took an extended  opportunity with this patient to outline the consequences of continued cigarette use  in airway disorders based on all the data we have from the multiple national lung health studies (perfomed over decades at millions of dollars in cost)  indicating that smoking cessation, not choice of inhalers or physicians, is the most important aspect of her care.   Assess willingness:  Not committed at this point Assist in quit attempt:  Per PCP when ready Arrange follow up:   Follow up per Primary Care planned  For smoking cessation classes call [phone]

## 2020-04-23 NOTE — Assessment & Plan Note (Signed)
Advised on approp oral hygiene give she is DM on ICS  >>> rx clotrimazole troche prn           Each maintenance medication was reviewed in detail including emphasizing most importantly the difference between maintenance and prns and under what circumstances the prns are to be triggered using an action plan format where appropriate.  Total time for H and P, chart review, counseling, teaching device and generating customized AVS unique to this office visit / charting = 60 imn

## 2020-04-23 NOTE — Progress Notes (Signed)
[name], female    DOB: 1966-02-25,   MRN: [id number]   Brief patient profile:  77 yowf active smoker started sob around 2012 and followed by Dr Juanetta Gosling on chronic 02 and  symbicort 160 with baseline lots of saba use and freq flares to ER as recently as 04/21/20 so referred to pulmonary clinic 04/23/2020 by Dr   Judee Clara.  Note give zmax and steroids in ER but did not start the latter due to DM      History of Present Illness  04/23/2020  Pulmonary/ 1st office eval/[name] - not vaccinated Chief Complaint  Patient presents with  . Pulmonary Consult    Referred by Dr. Dorette Grate. Former pt of Dr [employer]. Pt c/o head congestion for the past 3-4 days. She has had dry cough and minimal wheezing. She has been using her rescue inhaler 3 x per day but recently ran out.    Dyspnea:  MMRC3 = can't walk 100 yards even at a slow pace at a flat grade s stopping due to sob  On 3lpm 24/7 Cough: rattling worse lately x 3-4 days esp in am just mucoid  Sleep: bed is flat, on side 3 pillows  SABA use: as above/ way too much    No obvious day to day or daytime variability or assoc excess/ purulent sputum or mucus plugs or hemoptysis or cp or chest tightness, subjective wheeze or overt sinus or hb symptoms.   sleeping without nocturnal  or early am exacerbation  of respiratory  c/o's or need for noct saba. Also denies any obvious fluctuation of symptoms with weather or environmental changes or other aggravating or alleviating factors except as outlined above   No unusual exposure hx or h/o childhood pna/ asthma or knowledge of premature birth.  Current Allergies, Complete Past Medical History, Past Surgical History, Family History, and Social History were reviewed in [employer] record.  ROS  The following are not active complaints unless bolded Hoarseness, sore throat, dysphagia, dental problems, itching, sneezing,  nasal congestion or discharge of excess mucus or purulent  secretions, ear ache,   fever, chills, sweats, unintended wt loss or wt gain, classically pleuritic or exertional cp,  orthopnea pnd or arm/hand swelling  or leg swelling, presyncope, palpitations, abdominal pain, anorexia, nausea, vomiting, diarrhea  or change in bowel habits or change in bladder habits, change in stools or change in urine, dysuria, hematuria,  rash, arthralgias, visual complaints, headache, numbness, weakness or ataxia or problems with walking or coordination,  change in mood or  memory.           Past Medical History:  Diagnosis Date  . Angina   . Anxiety state 10/15/2015  . ARDS (adult respiratory distress syndrome) [facility])    Jan 2011  . Asthma   . Chronic back pain   . COPD (chronic obstructive pulmonary disease) (HCC)   . Diabetes mellitus   . GERD (gastroesophageal reflux disease) 12/21/2012  . HTN (hypertension) 10/15/2015  . Hyperglycemia, drug-induced    steroid induced hyperglycemia  . Migraine headache   . On home O2   . Pneumonia   . Recurrent upper respiratory infection (URI)   . Shortness of breath     Outpatient Medications Prior to Visit  Medication Sig Dispense Refill  . acetaminophen (TYLENOL) 500 MG tablet Take 500 mg by mouth every 6 (six) hours as needed for fever.     [name] albuterol (PROAIR HFA) 108 (90 Base) MCG/ACT inhaler Inhale  2 puffs into the lungs every 6 (six) hours as needed. For shortness of breath 18 g 0  . albuterol (PROVENTIL) (2.5 MG/3ML) 0.083% nebulizer solution Take 3 mLs (2.5 mg total) by nebulization every 4 (four) hours as needed for wheezing or shortness of breath. For shortness of breath 75 mL 1  . azithromycin (ZITHROMAX) 250 MG tablet Take 1 tablet (250 mg total) by mouth daily. Take first 2 tablets together, then 1 every day until finished. 6 tablet 0  . budesonide-formoterol (SYMBICORT) 160-4.5 MCG/ACT inhaler Inhale 2 puffs into the lungs 2 (two) times daily. 1 Inhaler 2  . fluticasone (FLONASE) 50 MCG/ACT nasal spray Place  2 sprays into both nostrils daily.  12  . glucose blood test strip 1 each by Other route 3 (three) times daily. Use as instructed, accucheck test strips 100 each 2  . guaiFENesin (MUCINEX) 600 MG 12 hr tablet Take 1 tablet (600 mg total) by mouth 2 (two) times daily. 60 tablet 2  . Insulin Glargine (LANTUS) 100 UNIT/ML Solostar Pen Inject 15 Units into the skin daily. 15 mL 2  . Insulin Pen Needle 31G X 5 MM MISC Use as directed 100 each 0  . lisinopril (ZESTRIL) 20 MG tablet Take 1 tablet (20 mg total) by mouth daily. 30 tablet 1  . metFORMIN (GLUCOPHAGE) 500 MG tablet Take 2 tablets (1,000 mg total) by mouth 2 (two) times daily. 120 tablet 1  . pantoprazole (PROTONIX) 40 MG tablet Take 1 tablet (40 mg total) by mouth daily. 30 tablet 1  . SUMAtriptan (IMITREX) 50 MG tablet Take 1 tablet (50 mg total) by mouth daily as needed for migraine or headache. 10 tablet 0  . tiotropium (SPIRIVA) 18 MCG inhalation capsule Place 1 capsule (18 mcg total) into inhaler and inhale daily. 30 capsule 1  . ibuprofen (ADVIL) 600 MG tablet Take 1 tablet (600 mg total) by mouth every 6 (six) hours as needed. 30 tablet 0  . LYRICA 100 MG capsule Take 100 mg by mouth 2 (two) times daily.  5  . nystatin (MYCOSTATIN) 100000 UNIT/ML suspension Take 5 mLs (500,000 Units total) by mouth 4 (four) times daily. (Patient not taking: Reported on 04/23/2020) 60 mL 0  . ondansetron (ZOFRAN ODT) 4 MG disintegrating tablet Take 1 tablet (4 mg total) by mouth every 8 (eight) hours as needed for nausea or vomiting. 20 tablet 0  . OxyCODONE (OXYCONTIN) 10 mg T12A Take 1 tablet (10 mg total) by mouth every 12 (twelve) hours. 60 tablet 0  . predniSONE (STERAPRED UNI-PAK 21 TAB) 10 MG (21) TBPK tablet Take 6 tabs (60mg ) day 1, 5 tabs (50mg ) day 2, 4 tabs (40mg ) day 3, 3 tabs (30mg ) day 4, 2 tabs (20mg ) day 5, and 1 tab (10mg ) day 6. (Patient not taking: Reported on 04/23/2020) 21 tablet 0  . busPIRone (BUSPAR) 5 MG tablet Take 1 tablet (5 mg  total) by mouth 2 (two) times daily. 60 tablet 1  . clonazePAM (KLONOPIN) 0.5 MG tablet Take 0.5 mg by mouth 3 (three) times daily.     . fluconazole (DIFLUCAN) 100 MG tablet Take 100 mg by mouth daily.    . mometasone (NASONEX) 50 MCG/ACT nasal spray Place 2 sprays into the nose daily. 17 g 0  . Oxycodone HCl 10 MG TABS Take 10 mg by mouth 3 (three) times daily.    . pregabalin (LYRICA) 100 MG capsule Take 100 mg by mouth 3 (three) times daily.    . promethazine (  PHENERGAN) 25 MG tablet TAKE 1 TABLET BY MOUTH EVERY 6 HOURS AS NEEDED FOR NAUSEA 30 tablet 1      Objective:     BP 126/82 (BP Location: Left Arm, Cuff Size: Normal)   Pulse 99   Temp (!) 97 F (36.1 C) (Temporal)   Ht 5' (1.524 m)   Wt 129 lb 8 oz (58.7 kg)   SpO2 100% Comment: 3lpm cont o2  BMI 25.29 kg/m   SpO2: 100 %(3lpm cont o2) O2 Type: Continuous O2 O2 Flow Rate (L/min): 3 L/min   amb chronically ill appearing wf nad / declined removing 02 to qualify for 02  HEENT : POS thrush.    NECK :  without JVD/Nodes/TM/ nl carotid upstrokes bilaterally   LUNGS: no acc muscle use,  Mod barrel  contour chest wall with bilateral  Distant bs insp/exp  wheeze and  without cough on insp or exp maneuvers and mod  Hyperresonant  to  percussion bilaterally     CV:  RRR  no s3 or murmur or increase in P2, and no edema   ABD:  soft and nontender with pos mid insp Hoover's  in the supine position. No bruits or organomegaly appreciated, bowel sounds nl  MS:     ext warm without deformities, calf tenderness, cyanosis or clubbing No obvious joint restrictions   SKIN: warm and dry without lesions    NEURO:  alert, approp, nl sensorium with  no motor or cerebellar deficits apparent.          I personally reviewed images and agree with radiology impression as follows:   Chest CTa 04/21/20 1. No evidence of acute abnormality. No pulmonary emboli or thoracic aortic aneurysm. 2. Near complete resolution of  tree-in-bud/nodular opacities within both lungs since 11/27/2019. 3. Unchanged mildly enlarged bilateral mediastinal and hilar lymph nodes - likely reactive and could be a manifestation of sarcoid. 4. Unchanged gallbladder distension, mild CBD dilatation to the ampulla and mild intrahepatic biliary dilatation since 2017. 5.  Emphysema (ICD10-J43.9).   Labs ordered/ reviewed:      Chemistry      Component Value Date/Time   NA 137 04/21/2020 1450   K 3.3 (L) 04/21/2020 1450   CL 94 (L) 04/21/2020 1450   CO2 31 04/21/2020 1450   BUN 8 04/21/2020 1450   CREATININE 0.49 04/21/2020 1450      Component Value Date/Time   CALCIUM 9.2 04/21/2020 1450   ALKPHOS 81 04/21/2020 1450   AST 13 (L) 04/21/2020 1450   ALT 8 04/21/2020 1450   BILITOT 0.2 (L) 04/21/2020 1450        Lab Results  Component Value Date   WBC 17.1 (H) 04/21/2020   HGB 12.4 04/21/2020   HCT 38.0 04/21/2020   MCV 84.3 04/21/2020   PLT 306 04/21/2020       EOS                                                               0.2                                    04/23/2020         BNP  04/21/20 =  48        Assessment   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  Active smoker/ no pfts on record - 04/23/2020  After extensive coaching inhaler device,  effectiveness =    75% > try breztri  DDX of  difficult airways management almost all start with A and  include Adherence, Ace Inhibitors, Acid Reflux, Active Sinus Disease, Alpha 1 Antitripsin deficiency, Anxiety masquerading as Airways dz,  ABPA,  Allergy(esp in young), Aspiration (esp in elderly), Adverse effects of meds,  Active smoking or vaping, A bunch of PE's (a small clot burden can't cause this syndrome unless there is already severe underlying pulm or vascular dz with poor reserve) plus two Bs  = Bronchiectasis and Beta blocker use..and one C= CHF  Adherence is always the initial "prime suspect" and is a multilayered concern that requires a "trust but  verify" approach in every patient - starting with knowing how to use medications, especially inhalers, correctly, keeping up with refills and understanding the fundamental difference between maintenance and prns vs those medications only taken for a very short course and then stopped and not refilled.  - see hfa teaching above - return with all meds in hand using a trust but verify approach to confirm accurate Medication  Reconciliation The principal here is that until we are certain that the  patients are doing what we've asked, it makes no sense to ask them to do more.   Active smoking also near  top of the list   ACEi adverse effects also near  the  top of the usual list of suspects and the only way to rule it out is a trial off > see a/p    ? Allergy/asthma > try just full dose ics to avoid systemic effects of prednisone in insulin dep diabetic  ? Anxiety > usually at the bottom of this list of usual suspects but should be much higher on this pt's based on H and P and note already on psychotropics and may interfere with ability to quit smoking and  adherence and also interpretation of response or lack thereof to symptom management which can be quite subjective.   ? Anemia/ thyroid dz  Lab Results  Component Value Date   TSH 0.229 (L) 02/21/2017      Lab Results  Component Value Date   HGB 12.4 04/21/2020   HGB 13.9 04/18/2020   HGB 11.4 (L) 11/29/2019     ? A bunch of PE's > neg CTa noted  ? chf > bnp reassuring   >>>> f/u in 2 weeks to regroup using ABCDE action plan found on AVS in meantime.  Pt informed of the seriousness of COVID 19 infection as a direct risk to lung health  and safey and to close contacts and should continue to wear a facemask in public and minimize exposure to public locations but especially avoid any area or activity where non-close contacts are not observing distancing or wearing an appropriate face mask.  I strongly recommended vaccine  asap.     Chronic respiratory failure with hypoxia and hypercapnia ([place]) HC03  04/21/20  =  31 on 3lpm NP  Adequate control on present rx, reviewed in detail with pt > no change in rx needed    Wants to qualify for portable 02 but declined to remove 02 today for trial, will see if can do this at next ov  Essential hypertension D/c acei 04/23/2020 due to concerns of cough/  upper airway wheeze   In the best review of chronic cough to date ( NEJM 2016 375 3893-7342) ,  ACEi are now felt to cause cough in up to  20% of pts which is a 4 fold increase from previous reports and does not include the variety of non-specific complaints we see in pulmonary clinic in pts on ACEi but previously attributed to another dx like  Copd/asthma and  include PNDS, throat and chest congestion, "bronchitis", unexplained dyspnea and noct "strangling" sensations, and hoarseness, but also  atypical /refractory GERD symptoms like dysphagia and "bad heartburn"   The only way I know  to prove this is not an "ACEi Case" is a trial off ACEi x a minimum of 6 weeks then regroup.   >>> try micardis 80 mg daily instead of lisinopril      Cigarette smoker 4-5 min discussion re active cigarette smoking in addition to office E&M  Ask about tobacco use:   Ongoing as is husband Advise quitting   I took an extended  opportunity with this patient to outline the consequences of continued cigarette use  in airway disorders based on all the data we have from the multiple national lung health studies (perfomed over decades at millions of dollars in cost)  indicating that smoking cessation, not choice of inhalers or physicians, is the most important aspect of her care.   Assess willingness:  Not committed at this point Assist in quit attempt:  Per PCP when ready Arrange follow up:   Follow up per Primary Care planned  For smoking cessation classes call [phone]       Oral candidiasis Advised on approp oral hygiene give she is  DM on ICS  >>> rx clotrimazole troche prn           Each maintenance medication was reviewed in detail including emphasizing most importantly the difference between maintenance and prns and under what circumstances the prns are to be triggered using an action plan format where appropriate.  Total time for H and P, chart review, counseling, teaching device and generating customized AVS unique to this office visit / charting = 60 imn          Sandrea Hughs, MD 04/23/2020  1

## 2020-04-23 NOTE — Assessment & Plan Note (Signed)
Active smoker/ no pfts on record - 04/23/2020  After extensive coaching inhaler device,  effectiveness =    75% > try breztri  DDX of  difficult airways management almost all start with A and  include Adherence, Ace Inhibitors, Acid Reflux, Active Sinus Disease, Alpha 1 Antitripsin deficiency, Anxiety masquerading as Airways dz,  ABPA,  Allergy(esp in young), Aspiration (esp in elderly), Adverse effects of meds,  Active smoking or vaping, A bunch of PE's (a small clot burden can't cause this syndrome unless there is already severe underlying pulm or vascular dz with poor reserve) plus two Bs  = Bronchiectasis and Beta blocker use..and one C= CHF  Adherence is always the initial "prime suspect" and is a multilayered concern that requires a "trust but verify" approach in every patient - starting with knowing how to use medications, especially inhalers, correctly, keeping up with refills and understanding the fundamental difference between maintenance and prns vs those medications only taken for a very short course and then stopped and not refilled.  - see hfa teaching above - return with all meds in hand using a trust but verify approach to confirm accurate Medication  Reconciliation The principal here is that until we are certain that the  patients are doing what we've asked, it makes no sense to ask them to do more.   Active smoking also near  top of the list   ACEi adverse effects also near  the  top of the usual list of suspects and the only way to rule it out is a trial off > see a/p    ? Allergy/asthma > try just full dose ics to avoid systemic effects of prednisone in insulin dep diabetic  ? Anxiety > usually at the bottom of this list of usual suspects but should be much higher on this pt's based on H and P and note already on psychotropics and may interfere with ability to quit smoking and  adherence and also interpretation of response or lack thereof to symptom management which can be quite  subjective.   ? Anemia/ thyroid dz  Lab Results  Component Value Date   TSH 0.229 (L) 02/21/2017      Lab Results  Component Value Date   HGB 12.4 04/21/2020   HGB 13.9 04/18/2020   HGB 11.4 (L) 11/29/2019     ? A bunch of PE's > neg CTa noted  ? chf > bnp reassuring   >>>> f/u in 2 weeks to regroup using ABCDE action plan found on AVS in meantime.  Pt informed of the seriousness of COVID 19 infection as a direct risk to lung health  and safey and to close contacts and should continue to wear a facemask in public and minimize exposure to public locations but especially avoid any area or activity where non-close contacts are not observing distancing or wearing an appropriate face mask.  I strongly recommended vaccine asap.

## 2020-04-23 NOTE — Assessment & Plan Note (Signed)
D/c acei 04/23/2020 due to concerns of cough/ upper airway wheeze   In the best review of chronic cough to date ( NEJM 2016 375 7356-7014) ,  ACEi are now felt to cause cough in up to  20% of pts which is a 4 fold increase from previous reports and does not include the variety of non-specific complaints we see in pulmonary clinic in pts on ACEi but previously attributed to another dx like  Copd/asthma and  include PNDS, throat and chest congestion, "bronchitis", unexplained dyspnea and noct "strangling" sensations, and hoarseness, but also  atypical /refractory GERD symptoms like dysphagia and "bad heartburn"   The only way I know  to prove this is not an "ACEi Case" is a trial off ACEi x a minimum of 6 weeks then regroup.   >>> try micardis 80 mg daily instead of lisinopril

## 2020-04-23 NOTE — Assessment & Plan Note (Signed)
HC03  04/21/20  =  31 on 3lpm NP  Adequate control on present rx, reviewed in detail with pt > no change in rx needed    Wants to qualify for portable 02 but declined to remove 02 today for trial, will see if can do this at next ov

## 2020-04-24 ENCOUNTER — Telehealth: Payer: Self-pay | Admitting: Internal Medicine

## 2020-04-24 ENCOUNTER — Ambulatory Visit (INDEPENDENT_AMBULATORY_CARE_PROVIDER_SITE_OTHER): Payer: Medicaid Other | Admitting: Gastroenterology

## 2020-04-24 NOTE — Telephone Encounter (Signed)
PA request was received from (pharmacy): [facility] Phone:[phone]  Medication name and strength: Telmisartan 80mg  Ordering Provider:  Was PA started with [facility]?: yes If yes, please enter KEY: [id number] Medication tried and failed: Lisinopril Covered Alternatives: Olmesartan-amloipine-HCTZ, Losartan-HCTZ, Valsartan-HCTZ, Irbesartan-HCTZ, Amlodipine-Olmesartan, amlodipine-balsartan, amlodipine-valsartan-HCTZ  PA sent to plan, time frame for approval / denial: 5 business days Routing to [place] for follow-up

## 2020-04-30 ENCOUNTER — Telehealth: Payer: Self-pay | Admitting: Emergency Medicine

## 2020-04-30 ENCOUNTER — Telehealth: Payer: Self-pay | Admitting: Internal Medicine

## 2020-04-30 NOTE — Telephone Encounter (Signed)
Checked Cover My Meds (see other telephone encounter). PA is still pending. Spoke with pt and she is aware of this. Nothing further needed at this time.

## 2020-04-30 NOTE — Telephone Encounter (Signed)
PA request was received from (pharmacy): [facility]  Phone:[phone] Medication name and strength: Telmetartan 80mg  Ordering Provider: Dr.  Was PA started with [facility]?: yes If yes, please enter KEY: [id number] Medication tried and failed: Lisinopril Covered Alternatives: Amlodipine-olmesartan, Amlodipine-valsartan, Amlodipine-Valsartan-HCTZ Olmesartan-Amlodipine-HCTZ, Irbesartan-HCTZ, Losartan-HCTZ, Olmesartan-HCTZ, Valsartan-HCTZ  PA sent to plan, time frame for approval / denial: 5 business days Routing to [place] for follow-up

## 2020-05-03 ENCOUNTER — Telehealth: Payer: Self-pay | Admitting: Internal Medicine

## 2020-05-03 MED ORDER — IRBESARTAN 150 MG PO TABS
150.0000 mg | ORAL_TABLET | Freq: Every day | ORAL | 11 refills | Status: DC
Start: 2020-05-03 — End: 2021-05-29

## 2020-05-03 NOTE — Telephone Encounter (Signed)
Called and spoke with pt letting her know that we were calling in avapro to pharmacy for her and she verbalized understanding. Verified pt's preferred pharmacy and sent Rx in for pt. Nothing further needed.

## 2020-05-03 NOTE — Telephone Encounter (Signed)
avapro 150 mg one daily (ok for generic)

## 2020-05-03 NOTE — Telephone Encounter (Signed)
Spoke with pt, she states she received a letter that the telmisartan was not covered by her insurance. She stated that her blood pressure was 170/103 last night. Can we change her BP med to something else? MW please advise.    Patient Instructions by Nyoka Cowden, MD at 04/23/2020 11:15 AM Author: Nyoka Cowden, MD Author Type: Physician Filed: 04/23/2020 11:53 AM  Note Status: Addendum Cosign: Cosign Not Required Encounter Date: 04/23/2020  Editor: Nyoka Cowden, MD (Physician)      Prior Versions: 1. Nyoka Cowden, MD (Physician) at 04/23/2020 11:51 AM - Addendum   2. Nyoka Cowden, MD (Physician) at 04/23/2020 11:48 AM - Addendum   3. Nyoka Cowden, MD (Physician) at 04/23/2020 11:47 AM - Signed      Lantus should be increased to 15 units twice daily and monitor sugars at least before bfast and supper and adjust to get it less than 150   For thrush take clotrimzole 10 mg up to 5 x daily   Stop lisinopril and start micardis 80 mg (telmasartan)   GERD (REFLUX)  is an extremely common cause of respiratory symptoms just like yours , many times with no obvious heartburn at all.    It can be treated with medication, but also with lifestyle changes including elevation of the head of your bed (ideally with 6 -8inch blocks under the headboard of your bed),  Smoking cessation, avoidance of late meals, excessive alcohol, and avoid fatty foods, chocolate, peppermint, colas, red wine, and acidic juices such as orange juice.  NO MINT OR MENTHOL PRODUCTS SO NO COUGH DROPS  USE SUGARLESS CANDY INSTEAD (Jolley ranchers or Stover's or Life Savers) or even ice chips will also do - the key is to swallow to prevent all throat clearing. NO OIL BASED VITAMINS - use powdered substitutes.  Avoid fish oil when coughing.    Plan A = Automatic = Always=    Breztri Take 2 puffs first thing in am and then another 2 puffs about 12 hours later. (stop symbicort)    Work on inhaler technique:  relax and gently  blow all the way out then take a nice smooth deep breath back in, triggering the inhaler at same time you start breathing in.  Hold for up to 5 seconds if you can. Blow out thru nose. Rinse and gargle with water when done

## 2020-05-08 ENCOUNTER — Encounter: Payer: Self-pay | Admitting: Adult Health

## 2020-05-08 ENCOUNTER — Telehealth: Payer: Self-pay | Admitting: Adult Health

## 2020-05-08 ENCOUNTER — Other Ambulatory Visit: Payer: Self-pay

## 2020-05-08 ENCOUNTER — Ambulatory Visit (INDEPENDENT_AMBULATORY_CARE_PROVIDER_SITE_OTHER): Payer: Medicaid Other | Admitting: Adult Health

## 2020-05-08 DIAGNOSIS — J449 Chronic obstructive pulmonary disease, unspecified: Secondary | ICD-10-CM | POA: Diagnosis not present

## 2020-05-08 MED ORDER — CEFDINIR 300 MG PO CAPS
300.0000 mg | ORAL_CAPSULE | Freq: Two times a day (BID) | ORAL | 0 refills | Status: DC
Start: 2020-05-08 — End: 2020-09-27

## 2020-05-08 MED ORDER — FLUCONAZOLE 150 MG PO TABS
150.0000 mg | ORAL_TABLET | Freq: Once | ORAL | 0 refills | Status: AC
Start: 2020-05-08 — End: 2020-05-08

## 2020-05-08 NOTE — Telephone Encounter (Signed)
Try olmesartan 40/12.5 one daily

## 2020-05-08 NOTE — Patient Instructions (Addendum)
Omnicef 300mg  twice daily for 1 week Diflucan 150 g x 1 for yeast infection Mucinex DM twice daily as needed for cough congestion Continue on BREZTRI 2 puffs twice daily, rinse after use Continue on Oxygen 3 L Follow-up in 2 weeks with Dr. and as needed Please contact office for sooner follow up if symptoms do not improve or worsen or seek emergency care

## 2020-05-08 NOTE — Telephone Encounter (Signed)
lmtcb x1 for pt. 

## 2020-05-08 NOTE — Telephone Encounter (Signed)
Pt had OV with TP today 6/16.   Assessment and Plan: Slow to resolve COPD exacerbation.-We will give additional antibiotics.  Recent CT chest showed no evidence of pneumonia.  Patient is continue on BREZTRI .  Close follow-up.  Oxygen dependent respiratory failure.  O2 saturations are well on current levels  Reported yeast infection with antibiotics-instructions to use topically.  Needed.  Otherwise follow-up with primary care provider  Diabetes with elevated blood sugars with recent prednisone use.  Hold on prednisone for now.  Advised to follow-up primary care provider.  Plan  Patient Instructions  Omnicef 300mg  twice daily for 1 week Diflucan 150 g x 1 for yeast infection Mucinex DM twice daily as needed for cough congestion Continue on BREZTRI 2 puffs twice daily, rinse after use Continue on Oxygen 3 L Follow-up in 2 weeks with Dr. and as needed Please contact office for sooner follow up if symptoms do not improve or worsen or seek emergency care       Follow Up Instructions: Follow up in 2 weeks and As needed     I discussed the assessment and treatment plan with the patient. The patient was provided an opportunity to ask questions and all were answered. The patient agreed with the plan and demonstrated an understanding of the instructions.  The patient was advised to call back or seek an in-person evaluation if the symptoms worsen or if the condition fails to improve as anticipated.  I provided 22  minutes of non-face-to-face time during this encounter.  Called and spoke with pt to see if I could get her scheduled for a f/u with MW per OV with TP today and she verbalized understanding. MW did not have any openings for 2 weeks so I have scheduled pt for a follow up with MW in 4 weeks. Stated to pt that I have been made aware that MW should be opening up afternoon schedule in [place] within next couple weeks so we can see if we are able to move her appt up  sooner as TP really wanted pt to f/u in 2 weeks.   Pt has currently been scheduled for appt with MW 7/20. Routing to front desk pool so they can keep an eye out for when MW's afternoon  schedule opens up to see if we can get pt's appt moved up sooner.

## 2020-05-08 NOTE — Progress Notes (Signed)
Virtual Visit via Telephone Note  I connected with [name] on 05/08/20 at 11:45 AM EDT by telephone and verified that I am speaking with the correct person using two identifiers.  Location: Patient: Home  Provider: Office    I discussed the limitations, risks, security and privacy concerns of performing an evaluation and management service by telephone and the availability of in person appointments. I also discussed with the patient that there may be a patient responsible charge related to this service. The patient expressed understanding and agreed to proceed.   History of Present Illness: 54 year old female active smoker seen for pulmonary consult April 23, 2020 for COPD and oxygen dependent respiratory failure  Today's televisit is an acute office visit.  Patient was seen for a pulmonary consult 2 weeks ago for COPD and oxygen dependent respiratory failure.  Patient has been on oxygen since 2012.  Last visit patient had been in the emergency room for COPD exacerbation.  Given a Z-Pak and prednisone taper.  She was instructed to finish these.  Change lisinopril to Benicar.  Patient says that she did finish these antibiotic and prednisone but does not feel any better now has sinus congestion postnasal drainage productive cough with thick yellow mucus.  Says she needs a new antibiotic because she is not getting any better.  O2 saturations have been good on 3 L.  Her blood sugars have been elevated as she is a diabetic since taking steroids.  Was also changed from to have cough and wheezing.  She denies any hemoptysis chest pain orthopnea PND or edema.  She denies any fever or loss of taste or smell.  She does continue to smoke.  Smoking cessation was given. Patient does request a prescription for Diflucan 1 dose for yeast infection.  Says that she always gets a yeast infection with antibiotics  Patient Active Problem List   Diagnosis Date Noted  . Chronic respiratory failure with hypoxia and  hypercapnia (HCC) 04/23/2020  . Hoarseness or changing voice 01/11/2020  . Nausea and vomiting 01/11/2020  . Acute on chronic respiratory failure with hypoxia and hypercapnia (HCC) 11/27/2019  . Chronic pain syndrome 11/27/2019  . Lobar pneumonia (HCC) 11/27/2019  . Atypical pneumonia 09/28/2016  . Chronic obstructive pulmonary disease (HCC)   . Pneumonia 05/30/2016  . SIRS (systemic inflammatory response syndrome) (HCC) 10/16/2015  . Sepsis (HCC) 10/15/2015  . Anxiety state 10/15/2015  . Essential hypertension 10/15/2015  . Hypokalemia 10/15/2015  . COPD with acute exacerbation (HCC) 08/14/2014  . Acute-on-chronic respiratory failure (HCC) 03/13/2014  . CAP (community acquired pneumonia) 03/11/2014  . Community acquired pneumonia 03/11/2014  . COPD exacerbation (HCC) 04/22/2013  . GERD (gastroesophageal reflux disease) 12/21/2012  . Constipation 12/21/2012  . Healthcare-associated pneumonia 02/03/2012  . Candida infection 11/26/2011  . Viral syndrome 11/21/2011  . DM (diabetes mellitus) (HCC) 11/21/2011  . Oral candidiasis 07/03/2011  . Low back pain 06/16/2011  . Muscle spasms of lower extremity 06/16/2011  . Cigarette smoker 09/12/2010  . 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  02/14/2010  . UNSPECIFIED NUTRITIONAL DEFICIENCY 02/13/2010  . ADULT RESPIRATORY DISTRESS SYNDROME 02/13/2010  . DELIRIUM 02/13/2010   Current Outpatient Medications on File Prior to Visit  Medication Sig Dispense Refill  . acetaminophen (TYLENOL) 500 MG tablet Take 500 mg by mouth every 6 (six) hours as needed for fever.     [name] albuterol (PROAIR HFA) 108 (90 Base) MCG/ACT inhaler Inhale 2  puffs into the lungs every 4 (four) hours as needed. For shortness of breath 18 g 11  . albuterol (PROVENTIL) (2.5 MG/3ML) 0.083% nebulizer solution Take 3 mLs (2.5 mg total) by nebulization every 4 (four) hours as needed for wheezing or shortness  of breath. For shortness of breath 75 mL 1  . Budeson-Glycopyrrol-Formoterol (BREZTRI AEROSPHERE) 160-9-4.8 MCG/ACT AERO Inhale 2 puffs into the lungs 2 (two) times daily. 5.9 g 0  . clotrimazole (MYCELEX) 10 MG troche Take 1 tablet (10 mg total) by mouth 5 (five) times daily. 25 Troche 11  . fluticasone (FLONASE) 50 MCG/ACT nasal spray Place 2 sprays into both nostrils daily.  12  . glucose blood test strip 1 each by Other route 3 (three) times daily. Use as instructed, accucheck test strips 100 each 2  . guaiFENesin (MUCINEX) 600 MG 12 hr tablet Take 1 tablet (600 mg total) by mouth 2 (two) times daily. 60 tablet 2  . Insulin Glargine (LANTUS) 100 UNIT/ML Solostar Pen Inject 15 Units into the skin daily. 15 mL 2  . Insulin Pen Needle 31G X 5 MM MISC Use as directed 100 each 0  . irbesartan (AVAPRO) 150 MG tablet Take 1 tablet (150 mg total) by mouth daily. 30 tablet 11  . metFORMIN (GLUCOPHAGE) 500 MG tablet Take 2 tablets (1,000 mg total) by mouth 2 (two) times daily. 120 tablet 1  . pantoprazole (PROTONIX) 40 MG tablet Take 1 tablet (40 mg total) by mouth daily. 30 tablet 1  . SUMAtriptan (IMITREX) 50 MG tablet Take 1 tablet (50 mg total) by mouth daily as needed for migraine or headache. 10 tablet 0  . [DISCONTINUED] ARIPiprazole (ABILIFY) 5 MG tablet Take 5 mg by mouth daily.      . [DISCONTINUED] venlafaxine (EFFEXOR) 75 MG tablet Take 75 mg by mouth daily.       No current facility-administered medications on file prior to visit.      Observations/Objective: Speaks in full sentences with no audible distress  CT chest Mar 25, 2020 showed no evidence of acute cardiopulmonary process.  Near complete resolution of previous tree-in-bud nodular opacities.  Unchanged mildly enlarged bilateral mediastinal and hilar lymph nodes.  Positive emphysema.  SARS-CoV-2 was negative  Assessment and Plan: Slow to resolve COPD exacerbation.-We will give additional antibiotics.  Recent CT chest showed  no evidence of pneumonia.  Patient is continue on BREZTRI .  Close follow-up.  Oxygen dependent respiratory failure.  O2 saturations are well on current levels  Reported yeast infection with antibiotics-instructions to use topically.  Needed.  Otherwise follow-up with primary care provider  Diabetes with elevated blood sugars with recent prednisone use.  Hold on prednisone for now.  Advised to follow-up primary care provider.  Plan  Patient Instructions  Omnicef 300mg  twice daily for 1 week Diflucan 150 g x 1 for yeast infection Mucinex DM twice daily as needed for cough congestion Continue on BREZTRI 2 puffs twice daily, rinse after use Continue on Oxygen 3 L Follow-up in 2 weeks with Dr. Melvyn Novas and as needed Please contact office for sooner follow up if symptoms do not improve or worsen or seek emergency care       Follow Up Instructions: Follow up in 2 weeks and As needed      I discussed the assessment and treatment plan with the patient. The patient was provided an opportunity to ask questions and all were answered. The patient agreed with the plan and demonstrated an understanding of the instructions.  The patient was advised to call back or seek an in-person evaluation if the symptoms worsen or if the condition fails to improve as anticipated.  I provided 22  minutes of non-face-to-face time during this encounter.   Rubye Oaks, NP

## 2020-05-08 NOTE — Telephone Encounter (Signed)
Received denial on PA for telmisartan  Unfortunately, there are no alternatives listed

## 2020-05-10 NOTE — Telephone Encounter (Signed)
Spoke with the pt  She is already on irbesartan  See phone note 05/03/20 Nothing further needed

## 2020-05-10 NOTE — Telephone Encounter (Signed)
Elzie, please advise if you are okay with the date that pt has been scheduled for F/U with Dr. Sherene Sires as she wanted to be seen in the [place] office.

## 2020-05-10 NOTE — Telephone Encounter (Signed)
Patient is scheduled to see Dr. Sherene Sires on 06/11/2020 - this is his first available in [place] - will this be okay? -pr

## 2020-05-14 NOTE — Telephone Encounter (Signed)
olmesartan 20 mg daily should be a good choice

## 2020-05-14 NOTE — Telephone Encounter (Signed)
Called [facility] to f/u on PA.  PA for Telmisartan denied, d/t did not fail 2 preferred meds.  She was previously on Lisinopril.  Dr. Sherene Sires, Please advise if you would like to prescribe one of the preferred meds for this patient. Olmesartan Losartan Valsartan Irbesartan  Please advise.  Thank you.

## 2020-05-14 NOTE — Telephone Encounter (Signed)
Called and spoke with pt letting her know the info stated by MW. Pt stated that she was just switched from telmisartan to irbesartan. Pt said this was done 6/11. Nothing further needed.

## 2020-05-15 ENCOUNTER — Other Ambulatory Visit: Payer: Self-pay | Admitting: Family Medicine

## 2020-05-16 NOTE — Telephone Encounter (Signed)
Yes

## 2020-05-16 NOTE — Telephone Encounter (Signed)
Tried calling pt- no answer LMTCB x 1 

## 2020-06-11 ENCOUNTER — Ambulatory Visit: Payer: Medicaid Other | Admitting: Internal Medicine

## 2020-06-11 NOTE — Progress Notes (Deleted)
[name], female    DOB: [birth year],   MRN: [id number]   Brief patient profile:  39 yowf active smoker started sob around 2012 and followed by Dr Juanetta Gosling on chronic 02 and  symbicort 160 with baseline lots of saba use and freq flares to ER as recently as 04/21/20 so referred to pulmonary clinic 04/23/2020 by Dr   Judee Clara.  Note give zmax and steroids in ER but did not start the latter due to DM      History of Present Illness  04/23/2020  Pulmonary/ 1st office eval/[name] - not vaccinated Chief Complaint  Patient presents with  . Pulmonary Consult    Referred by Dr. Dorette Grate. Former pt of Dr [employer]. Pt c/o head congestion for the past 3-4 days. She has had dry cough and minimal wheezing. She has been using her rescue inhaler 3 x per day but recently ran out.    Dyspnea:  MMRC3 = can't walk 100 yards even at a slow pace at a flat grade s stopping due to sob  On 3lpm 24/7 Cough: rattling worse lately x 3-4 days esp in am just mucoid  Sleep: bed is flat, on side 3 pillows  SABA use: as above/ way too much  rec Lantus should be increased to 15 units twice daily and monitor sugars at least before bfast and supper and adjust to get it less than 150  For thrush take clotrimzole 10 mg up to 5 x daily  Stop lisinopril and start micardis 80 mg (telmasartan)  GERD diet  Plan A = Automatic = Always=    Breztri Take 2 puffs first thing in am and then another 2 puffs about 12 hours later. (stop symbicort)  Work on inhaler technique:   Plan B = Backup (to supplement plan A, not to replace it) Only use your albuterol inhaler (Proair) as a Company secretary)  Plan C = Crisis (instead of Plan B but only if Plan B stops working) - only use your albuterol nebulizer if you first try Plan B and it fails to help > ok to use the nebulizer up to every 4 hours but if start needing it regularly call for immediate appointment Plan D = Deltasone = Prednisone - go ahead and take what the ER gave  Please  schedule a follow up office visit in 2 weeks, sooner if needed  with all medications /inhalers/ solutions in hand so we can verify exactly what you are taking. This includes all medications from all doctors and over the counters    NP eval/rec 05/08/20 televist  Omnicef 300mg  twice daily for 1 week Diflucan 150 g x 1 for yeast infection Mucinex DM twice daily as needed for cough congestion Continue on BREZTRI 2 puffs twice daily, rinse after use Continue on Oxygen 3 L Follow-up in 2 weeks with Dr. and as needed   06/11/2020  f/u ov/[name] office/[name] re:  No chief complaint on file.    Dyspnea:  *** Cough: *** Sleeping: *** SABA use: *** 02: ***   No obvious day to day or daytime variability or assoc excess/ purulent sputum or mucus plugs or hemoptysis or cp or chest tightness, subjective wheeze or overt sinus or hb symptoms.   *** without nocturnal  or early am exacerbation  of respiratory  c/o's or need for noct saba. Also denies any obvious fluctuation of symptoms with weather or environmental changes or other aggravating or alleviating factors except as outlined above  No unusual exposure hx or h/o childhood pna/ asthma or knowledge of premature birth.  Current Allergies, Complete Past Medical History, Past Surgical History, Family History, and Social History were reviewed in [employer] record.  ROS  The following are not active complaints unless bolded Hoarseness, sore throat, dysphagia, dental problems, itching, sneezing,  nasal congestion or discharge of excess mucus or purulent secretions, ear ache,   fever, chills, sweats, unintended wt loss or wt gain, classically pleuritic or exertional cp,  orthopnea pnd or arm/hand swelling  or leg swelling, presyncope, palpitations, abdominal pain, anorexia, nausea, vomiting, diarrhea  or change in bowel habits or change in bladder habits, change in stools or change in urine, dysuria, hematuria,  rash,  arthralgias, visual complaints, headache, numbness, weakness or ataxia or problems with walking or coordination,  change in mood or  memory.        No outpatient medications have been marked as taking for the 06/11/20 encounter (Appointment) with Nyoka Cowden, MD.                      Past Medical History:  Diagnosis Date  . Angina   . Anxiety state 10/15/2015  . ARDS (adult respiratory distress syndrome) [facility])    Jan 2011  . Asthma   . Chronic back pain   . COPD (chronic obstructive pulmonary disease) (HCC)   . Diabetes mellitus   . GERD (gastroesophageal reflux disease) 12/21/2012  . HTN (hypertension) 10/15/2015  . Hyperglycemia, drug-induced    steroid induced hyperglycemia  . Migraine headache   . On home O2   . Pneumonia   . Recurrent upper respiratory infection (URI)   . Shortness of breath       Objective:     Wt Readings from Last 3 Encounters:  04/23/20 129 lb 8 oz (58.7 kg)  04/21/20 132 lb 4.4 oz (60 kg)  04/18/20 134 lb (60.8 kg)     Vital signs reviewed - Note on arrival 06/11/2020  02 sats  ***% on ***       HEENT : POS thrush.      Mod barrel                    Assessment

## 2020-07-12 ENCOUNTER — Other Ambulatory Visit: Payer: Self-pay

## 2020-07-12 ENCOUNTER — Ambulatory Visit (HOSPITAL_COMMUNITY)
Admission: RE | Admit: 2020-07-12 | Discharge: 2020-07-12 | Disposition: A | Discharge status: Home / Self Care | Payer: Medicaid Other | Source: Ambulatory Visit | Attending: Family Medicine | Admitting: Family Medicine

## 2020-07-12 ENCOUNTER — Other Ambulatory Visit (HOSPITAL_COMMUNITY): Payer: Self-pay | Admitting: Family Medicine

## 2020-07-12 DIAGNOSIS — M25561 Pain in right knee: Secondary | ICD-10-CM

## 2020-07-12 DIAGNOSIS — M25551 Pain in right hip: Secondary | ICD-10-CM

## 2020-09-06 ENCOUNTER — Other Ambulatory Visit: Payer: Self-pay | Admitting: Family Medicine

## 2020-09-27 ENCOUNTER — Encounter (HOSPITAL_COMMUNITY): Payer: Self-pay | Admitting: Emergency Medicine

## 2020-09-27 ENCOUNTER — Emergency Department (HOSPITAL_COMMUNITY)
Admission: EM | Admit: 2020-09-27 | Discharge: 2020-09-27 | Disposition: A | Discharge status: Home / Self Care | Payer: Medicaid Other | Attending: Emergency Medicine | Admitting: Emergency Medicine

## 2020-09-27 ENCOUNTER — Emergency Department (HOSPITAL_COMMUNITY): Payer: Medicaid Other

## 2020-09-27 ENCOUNTER — Other Ambulatory Visit: Payer: Self-pay

## 2020-09-27 DIAGNOSIS — Z7952 Long term (current) use of systemic steroids: Secondary | ICD-10-CM | POA: Insufficient documentation

## 2020-09-27 DIAGNOSIS — Z7984 Long term (current) use of oral hypoglycemic drugs: Secondary | ICD-10-CM | POA: Diagnosis not present

## 2020-09-27 DIAGNOSIS — I10 Essential (primary) hypertension: Secondary | ICD-10-CM | POA: Insufficient documentation

## 2020-09-27 DIAGNOSIS — E1165 Type 2 diabetes mellitus with hyperglycemia: Secondary | ICD-10-CM | POA: Insufficient documentation

## 2020-09-27 DIAGNOSIS — Z794 Long term (current) use of insulin: Secondary | ICD-10-CM | POA: Insufficient documentation

## 2020-09-27 DIAGNOSIS — Z20822 Contact with and (suspected) exposure to covid-19: Secondary | ICD-10-CM | POA: Insufficient documentation

## 2020-09-27 DIAGNOSIS — J441 Chronic obstructive pulmonary disease with (acute) exacerbation: Secondary | ICD-10-CM | POA: Diagnosis not present

## 2020-09-27 DIAGNOSIS — Z79899 Other long term (current) drug therapy: Secondary | ICD-10-CM | POA: Diagnosis not present

## 2020-09-27 DIAGNOSIS — F17210 Nicotine dependence, cigarettes, uncomplicated: Secondary | ICD-10-CM | POA: Insufficient documentation

## 2020-09-27 DIAGNOSIS — R0602 Shortness of breath: Secondary | ICD-10-CM | POA: Diagnosis present

## 2020-09-27 LAB — CBC
HCT: 42.7 % (ref 36.0–46.0)
Hemoglobin: 14.1 g/dL (ref 12.0–15.0)
MCH: 27.5 pg (ref 26.0–34.0)
MCHC: 33 g/dL (ref 30.0–36.0)
MCV: 83.4 fL (ref 80.0–100.0)
Platelets: 412 10*3/uL — ABNORMAL HIGH (ref 150–400)
RBC: 5.12 MIL/uL — ABNORMAL HIGH (ref 3.87–5.11)
RDW: 11.9 % (ref 11.5–15.5)
WBC: 19.4 10*3/uL — ABNORMAL HIGH (ref 4.0–10.5)
nRBC: 0 % (ref 0.0–0.2)

## 2020-09-27 LAB — BASIC METABOLIC PANEL
Anion gap: 9 (ref 5–15)
BUN: 11 mg/dL (ref 6–20)
CO2: 30 mmol/L (ref 22–32)
Calcium: 9 mg/dL (ref 8.9–10.3)
Chloride: 93 mmol/L — ABNORMAL LOW (ref 98–111)
Creatinine, Ser: 0.5 mg/dL (ref 0.44–1.00)
GFR, Estimated: >60 mL/min (ref >60)
Glucose, Bld: 251 mg/dL — ABNORMAL HIGH (ref 70–99)
Potassium: 3.7 mmol/L (ref 3.5–5.1)
Sodium: 132 mmol/L — ABNORMAL LOW (ref 135–145)

## 2020-09-27 LAB — RESPIRATORY PANEL BY RT PCR (FLU A&B, COVID)
Influenza A by PCR: NEGATIVE
Influenza B by PCR: NEGATIVE
SARS Coronavirus 2 by RT PCR: NEGATIVE

## 2020-09-27 MED ORDER — MAGNESIUM SULFATE 2 GM/50ML IV SOLN
2.0000 g | Freq: Once | INTRAVENOUS | Status: AC
  Administered 2020-09-27: 2 g via INTRAVENOUS
  Filled 2020-09-27: qty 50

## 2020-09-27 MED ORDER — ALBUTEROL SULFATE (2.5 MG/3ML) 0.083% IN NEBU
2.5000 mg | INHALATION_SOLUTION | Freq: Once | RESPIRATORY_TRACT | Status: AC
  Administered 2020-09-27: 2.5 mg via RESPIRATORY_TRACT
  Filled 2020-09-27: qty 3

## 2020-09-27 MED ORDER — CEFDINIR 300 MG PO CAPS
300.0000 mg | ORAL_CAPSULE | Freq: Two times a day (BID) | ORAL | 0 refills | Status: DC
Start: 2020-09-27 — End: 2021-08-19

## 2020-09-27 MED ORDER — IPRATROPIUM-ALBUTEROL 0.5-2.5 (3) MG/3ML IN SOLN
3.0000 mL | Freq: Once | RESPIRATORY_TRACT | Status: AC
  Administered 2020-09-27: 3 mL via RESPIRATORY_TRACT
  Filled 2020-09-27: qty 3

## 2020-09-27 MED ORDER — ALBUTEROL SULFATE HFA 108 (90 BASE) MCG/ACT IN AERS
2.0000 | INHALATION_SPRAY | RESPIRATORY_TRACT | Status: DC | PRN
  Administered 2020-09-27: 2 via RESPIRATORY_TRACT
  Filled 2020-09-27: qty 6.7

## 2020-09-27 MED ORDER — SODIUM CHLORIDE 0.9 % IV BOLUS
1000.0000 mL | Freq: Once | INTRAVENOUS | Status: AC
  Administered 2020-09-27: 1000 mL via INTRAVENOUS

## 2020-09-27 MED ORDER — ALBUTEROL SULFATE (2.5 MG/3ML) 0.083% IN NEBU
2.5000 mg | INHALATION_SOLUTION | Freq: Four times a day (QID) | RESPIRATORY_TRACT | 12 refills | Status: DC | PRN
Start: 2020-09-27 — End: 2022-01-26

## 2020-09-27 NOTE — Discharge Instructions (Addendum)
Follow up with your md next week. °

## 2020-09-27 NOTE — ED Triage Notes (Signed)
EMS called out for SOB. Pt in Tri-pod position per EMS and although pt wears O2 @3L  continuously; Fire Dept already had pt on Non-rebreather when they got there. Per EMS, pt was ST @ 130 on monitor and sats were 93% on O2 @ 3L [name]. CBG was reported to be 247. Pt given 125mg  Solumedrol en route and 2.5 of Albuterol en route by EMS.

## 2020-09-27 NOTE — ED Provider Notes (Signed)
[facility] EMERGENCY DEPARTMENT Provider Note   CSN: [id number] Arrival date & time: 09/27/20  2025     History Chief Complaint  Patient presents with   Shortness of Breath    [name] is a 54 y.o. female.  Patient has a history of severe COPD.  She has no albuterol liquid to use in her machine and her machine is not working.  She presented with short of breath.  And mild wheezing  The history is provided by the patient and medical records. No language interpreter was used.  Shortness of Breath Severity:  Moderate Onset quality:  Sudden Timing:  Constant Progression:  Worsening Chronicity:  Recurrent Context: not activity   Relieved by:  Nothing Worsened by:  Nothing Ineffective treatments:  None tried Associated symptoms: no abdominal pain, no chest pain, no cough, no headaches and no rash        Past Medical History:  Diagnosis Date   Angina    Anxiety state 10/15/2015   ARDS (adult respiratory distress syndrome) (HCC)    Jan 2011   Asthma    Chronic back pain    COPD (chronic obstructive pulmonary disease) (HCC)    Diabetes mellitus    GERD (gastroesophageal reflux disease) 12/21/2012   HTN (hypertension) 10/15/2015   Hyperglycemia, drug-induced    steroid induced hyperglycemia   Migraine headache    On home O2    Pneumonia    Recurrent upper respiratory infection (URI)    Shortness of breath     Patient Active Problem List   Diagnosis Date Noted   Chronic respiratory failure with hypoxia and hypercapnia (HCC) 04/23/2020   Hoarseness or changing voice 01/11/2020   Nausea and vomiting 01/11/2020   Acute on chronic respiratory failure with hypoxia and hypercapnia (HCC) 11/27/2019   Chronic pain syndrome 11/27/2019   Lobar pneumonia (HCC) 11/27/2019   Atypical pneumonia 09/28/2016   Chronic obstructive pulmonary disease (HCC)    Pneumonia 05/30/2016   SIRS (systemic inflammatory response syndrome) (HCC) 10/16/2015    Sepsis (HCC) 10/15/2015   Anxiety state 10/15/2015   Essential hypertension 10/15/2015   Hypokalemia 10/15/2015   COPD with acute exacerbation (HCC) 08/14/2014   Acute-on-chronic respiratory failure (HCC) 03/13/2014   CAP (community acquired pneumonia) 03/11/2014   Community acquired pneumonia 03/11/2014   COPD exacerbation (HCC) 04/22/2013   GERD (gastroesophageal reflux disease) 12/21/2012   Constipation 12/21/2012   Healthcare-associated pneumonia 02/03/2012   Candida infection 11/26/2011   Viral syndrome 11/21/2011   DM (diabetes mellitus) (HCC) 11/21/2011   Oral candidiasis 07/03/2011   Low back pain 06/16/2011   Muscle spasms of lower extremity 06/16/2011   Cigarette smoker 09/12/2010   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     Family History  Problem Relation Age of Onset   Coronary artery disease Brother    Diabetes Other    Cancer Other    Hypertension Other     Social History   Tobacco Use   Smoking status: Current Every Day Smoker    Packs/day: 1.00  Years: 42.00    Pack years: 42.00    Types: Cigarettes   Smokeless tobacco: Never Used  Building services engineer Use: Never used  Substance Use Topics   Alcohol use: No   Drug use: No    Comment: Formerly used cocaine , narcotics, THC    Home Medications Prior to Admission medications   Medication Sig Start Date End Date Taking? Authorizing Provider  acetaminophen (TYLENOL) 500 MG tablet Take 500 mg by mouth every 6 (six) hours as needed for fever.     [provider]  albuterol (PROVENTIL) (2.5 MG/3ML) 0.083% nebulizer solution Take 3 mLs (2.5 mg total) by nebulization every 6 (six) hours as needed for wheezing or shortness of breath. 09/27/20   Bethann Berkshire, MD  Budeson-Glycopyrrol-Formoterol (BREZTRI AEROSPHERE) 160-9-4.8 MCG/ACT AERO Inhale 2 puffs into the lungs 2 (two) times daily. 04/23/20   Nyoka Cowden, MD  cefdinir (OMNICEF) 300 MG capsule Take 1 capsule (300 mg total) by mouth 2 (two) times daily. 09/27/20   Bethann Berkshire, MD  clotrimazole (MYCELEX) 10 MG troche Take 1 tablet (10 mg total) by mouth 5 (five) times daily. 04/23/20   Nyoka Cowden, MD  fluticasone (FLONASE) 50 MCG/ACT nasal spray Place 2 sprays into both nostrils daily. 04/13/18   [provider]  glucose blood test strip 1 each by Other route 3 (three) times daily. Use as instructed, accucheck test strips 01/04/20   Corum, Minerva Fester, MD  guaiFENesin (MUCINEX) 600 MG 12 hr tablet Take 1 tablet (600 mg total) by mouth 2 (two) times daily. 11/29/19 11/28/20  Erick Blinks, MD  Insulin Glargine (LANTUS) 100 UNIT/ML Solostar Pen Inject 15 Units into the skin daily. 12/02/19   Cleora Fleet, MD  Insulin Pen Needle 31G X 5 MM MISC Use as directed 03/12/20   Corum, Minerva Fester, MD  irbesartan (AVAPRO) 150 MG tablet Take 1 tablet (150 mg total) by mouth daily. 05/03/20   Nyoka Cowden, MD  metFORMIN (GLUCOPHAGE) 500 MG tablet Take 2 tablets (1,000 mg total) by mouth 2 (two) times daily. 01/04/20   Corum, Minerva Fester, MD  pantoprazole (PROTONIX) 40 MG tablet Take 1 tablet (40 mg total) by mouth daily. 03/11/20   Corum, Minerva Fester, MD  SUMAtriptan (IMITREX) 50 MG tablet Take 1 tablet (50 mg total) by mouth daily as needed for migraine or headache. 03/11/20   Corum, Minerva Fester, MD  ARIPiprazole (ABILIFY) 5 MG tablet Take 5 mg by mouth daily.    02/03/12  [provider]  venlafaxine (EFFEXOR) 75 MG tablet Take 75 mg by mouth daily.    02/03/12  [provider]    Allergies    Penicillins,  Levaquin [levofloxacin in d5w], Doxycycline, and Morphine  Review of Systems   Review of Systems  Constitutional: Negative for appetite change and fatigue.  HENT: Negative for congestion, ear discharge and sinus pressure.   Eyes: Negative for discharge.  Respiratory: Positive for shortness of breath. Negative for cough.   Cardiovascular: Negative for chest pain.  Gastrointestinal: Negative for abdominal pain and diarrhea.  Genitourinary: Negative for frequency and hematuria.  Musculoskeletal: Negative for back pain.  Skin: Negative for rash.  Neurological: Negative for seizures and headaches.  Psychiatric/Behavioral: Negative for hallucinations.    Physical Exam Updated Vital Signs BP 120/66    Pulse (!) 107    Temp 98.6 F (37 C) (Oral)    Resp 20    Ht 5' (1.524 m)  Wt 58.1 kg    SpO2 96%    BMI 25.00 kg/m   Physical Exam Vitals and nursing note reviewed.  Constitutional:      Appearance: She is well-developed.  HENT:     Head: Normocephalic.  Eyes:     General: No scleral icterus.    Conjunctiva/sclera: Conjunctivae normal.  Neck:     Thyroid: No thyromegaly.  Cardiovascular:     Rate and Rhythm: Normal rate and regular rhythm.     Heart sounds: No murmur heard.  No friction rub. No gallop.   Pulmonary:     Breath sounds: No stridor. Wheezing present. No rales.  Chest:     Chest wall: No tenderness.  Abdominal:     General: There is no distension.     Tenderness: There is no abdominal tenderness. There is no rebound.  Musculoskeletal:        General: Normal range of motion.     Cervical back: Neck supple.  Lymphadenopathy:     Cervical: No cervical adenopathy.  Skin:    Findings: No erythema or rash.  Neurological:     Mental Status: She is alert and oriented to person, place, and time.     Motor: No abnormal muscle tone.     Coordination: Coordination normal.  Psychiatric:        Behavior: Behavior normal.     ED Results / Procedures / Treatments    Labs (all labs ordered are listed, but only abnormal results are displayed) Labs Reviewed  BASIC METABOLIC PANEL - Abnormal; Notable for the following components:      Result Value   Sodium 132 (*)    Chloride 93 (*)    Glucose, Bld 251 (*)    All other components within normal limits  CBC - Abnormal; Notable for the following components:   WBC 19.4 (*)    RBC 5.12 (*)    Platelets 412 (*)    All other components within normal limits  RESPIRATORY PANEL BY RT PCR (FLU A&B, COVID)    EKG None  Radiology DG Chest Port 1 View  Result Date: 09/27/2020 CLINICAL DATA:  Shortness of breath EXAM: PORTABLE CHEST 1 VIEW COMPARISON:  None. FINDINGS: Diffuse interstitial prominence, stable since prior study. Heart is normal size. No confluent airspace opacities or effusions. No acute bony abnormality. IMPRESSION: Stable chronic interstitial lung disease.  No active disease. Electronically Signed   By: Charlett NoseKevin  Dover M.D.   On: 09/27/2020 21:15    Procedures Procedures (including critical care time)  Medications Ordered in ED Medications  albuterol (VENTOLIN HFA) 108 (90 Base) MCG/ACT inhaler 2 puff (has no administration in time range)  magnesium sulfate IVPB 2 g 50 mL (0 g Intravenous Stopped 09/27/20 2154)  sodium chloride 0.9 % bolus 1,000 mL (0 mLs Intravenous Stopped 09/27/20 2155)  albuterol (PROVENTIL) (2.5 MG/3ML) 0.083% nebulizer solution 2.5 mg (2.5 mg Nebulization Given 09/27/20 2206)  ipratropium-albuterol (DUONEB) 0.5-2.5 (3) MG/3ML nebulizer solution 3 mL (3 mLs Nebulization Given 09/27/20 2206)    ED Course  I have reviewed the triage vital signs and the nursing notes.  Pertinent labs & imaging results that were available during my care of the patient were reviewed by me and considered in my medical decision making (see chart for details).    MDM Rules/Calculators/A&P                          Patient with exacerbation of COPD.  She is sent home on Omnicef along with  albuterol.  She is given a prescription for new machine will follow up with her PCP     This patient presents to the ED for concern of short of breath, this involves an extensive number of treatment options, and is a complaint that carries with it a high risk of complications and morbidity.  The differential diagnosis includes COPD exacerbation   Lab Tests:   I Ordered, reviewed, and interpreted labs, which included CBC and chemistries along with Covid test.  Patient did have elevated glucose and elevated white count with a negative Covid  Medicines ordered:   I ordered medication albuterol and steroids  Imaging Studies ordered:   I ordered imaging studies which included chest x-ray  I independently visualized and interpreted imaging which showed no acute disease  Additional history obtained:   Additional history obtained from record  Previous records obtained and reviewed.  Consultations Obtained:    Reevaluation:  After the interventions stated above, I reevaluated the patient and found much improved  Critical Interventions:     Final Clinical Impression(s) / ED Diagnoses Final diagnoses:  COPD exacerbation (HCC)    Rx / DC Orders ED Discharge Orders         Ordered    cefdinir (OMNICEF) 300 MG capsule  2 times daily        09/27/20 2303    albuterol (PROVENTIL) (2.5 MG/3ML) 0.083% nebulizer solution  Every 6 hours PRN        09/27/20 2303           Bethann Berkshire, MD 09/29/20 [phone]

## 2020-10-11 ENCOUNTER — Other Ambulatory Visit: Payer: Self-pay | Admitting: Family Medicine

## 2020-10-11 ENCOUNTER — Other Ambulatory Visit (HOSPITAL_COMMUNITY): Payer: Self-pay | Admitting: Family Medicine

## 2020-10-11 DIAGNOSIS — M5459 Other low back pain: Secondary | ICD-10-CM

## 2020-10-12 IMAGING — DX DG RIBS W/ CHEST 3+V*R*
4 series · 4 of 4 positions shown · non-contrast
Comparison: February 09, 2019

CLINICAL DATA: Right anterior rib pain post trauma.

EXAM:
RIGHT RIBS AND CHEST - 3+ VIEW

[chest pa]
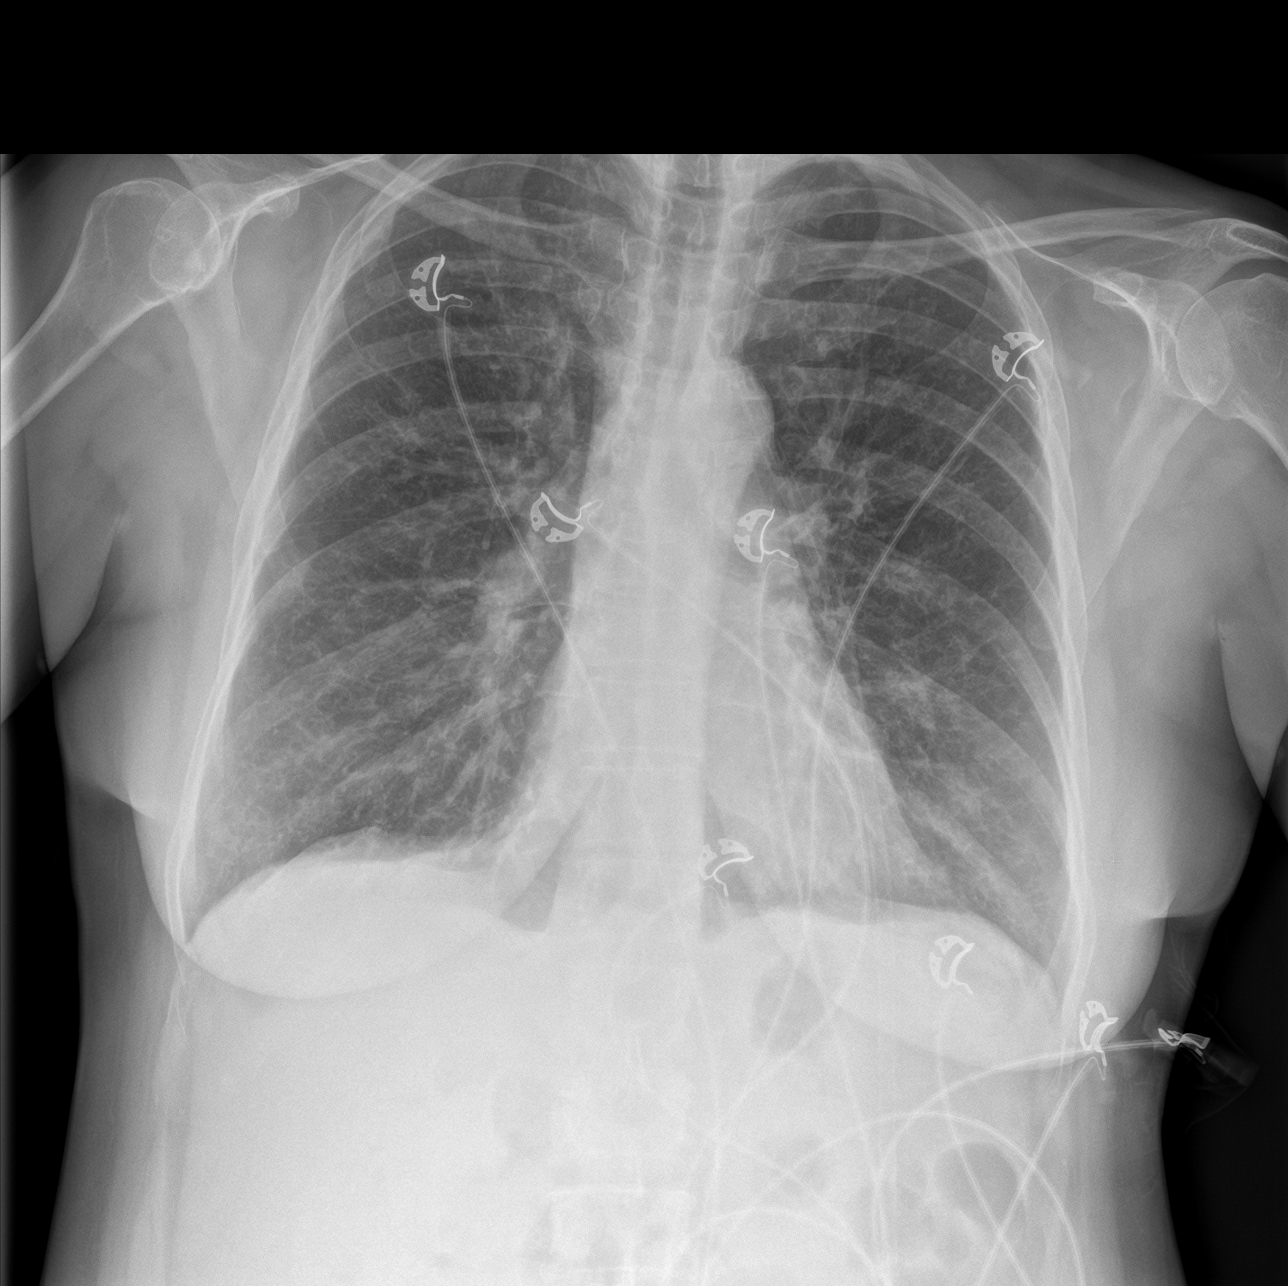

[rib pa]
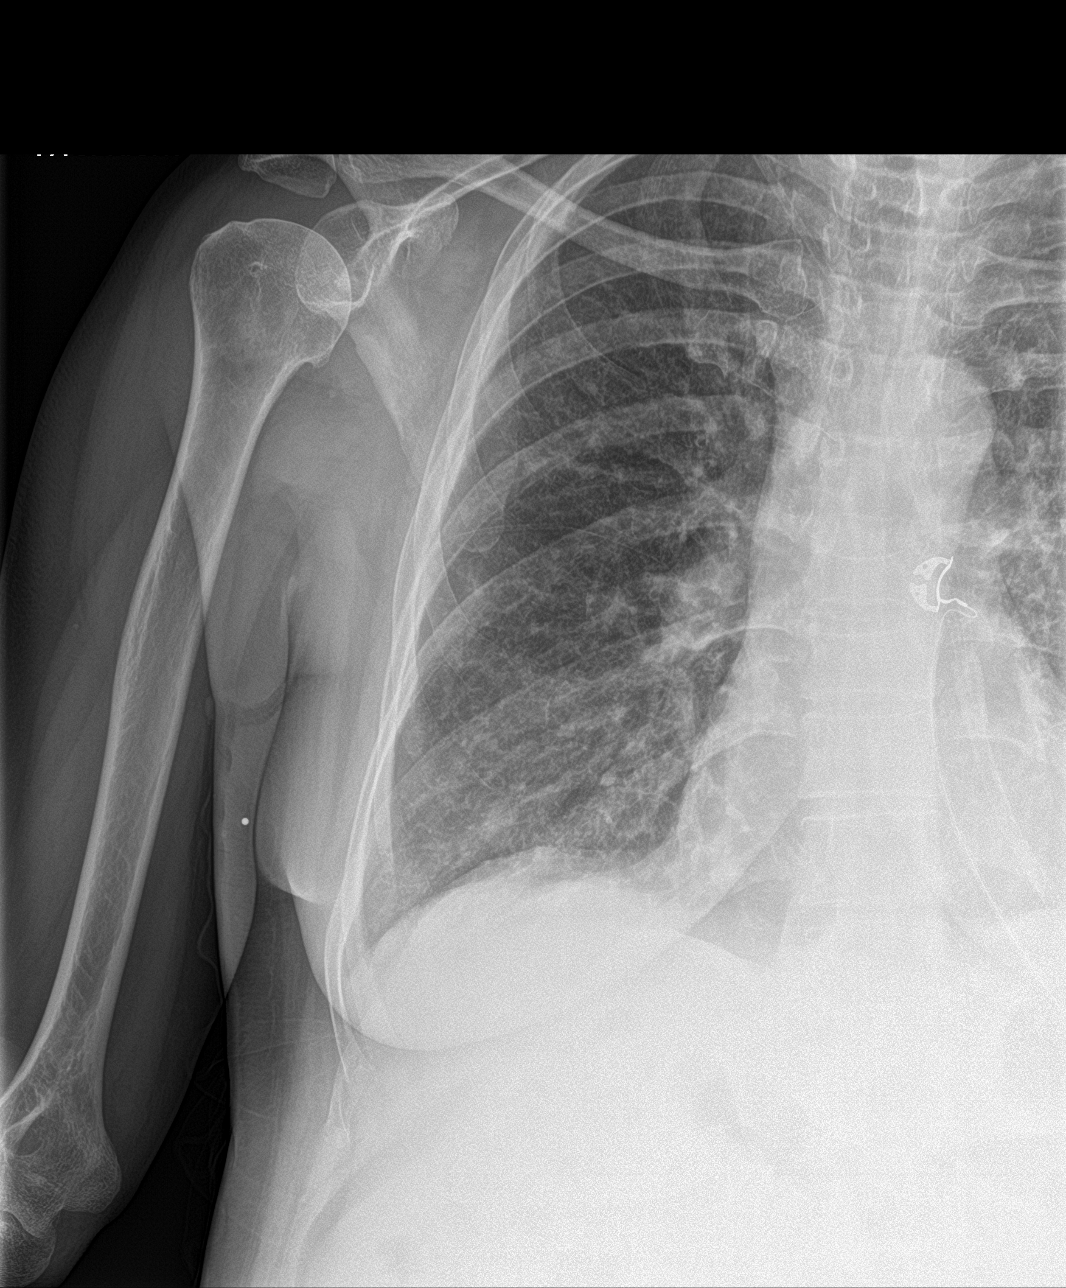

[rib pa obl (1 of 2)]
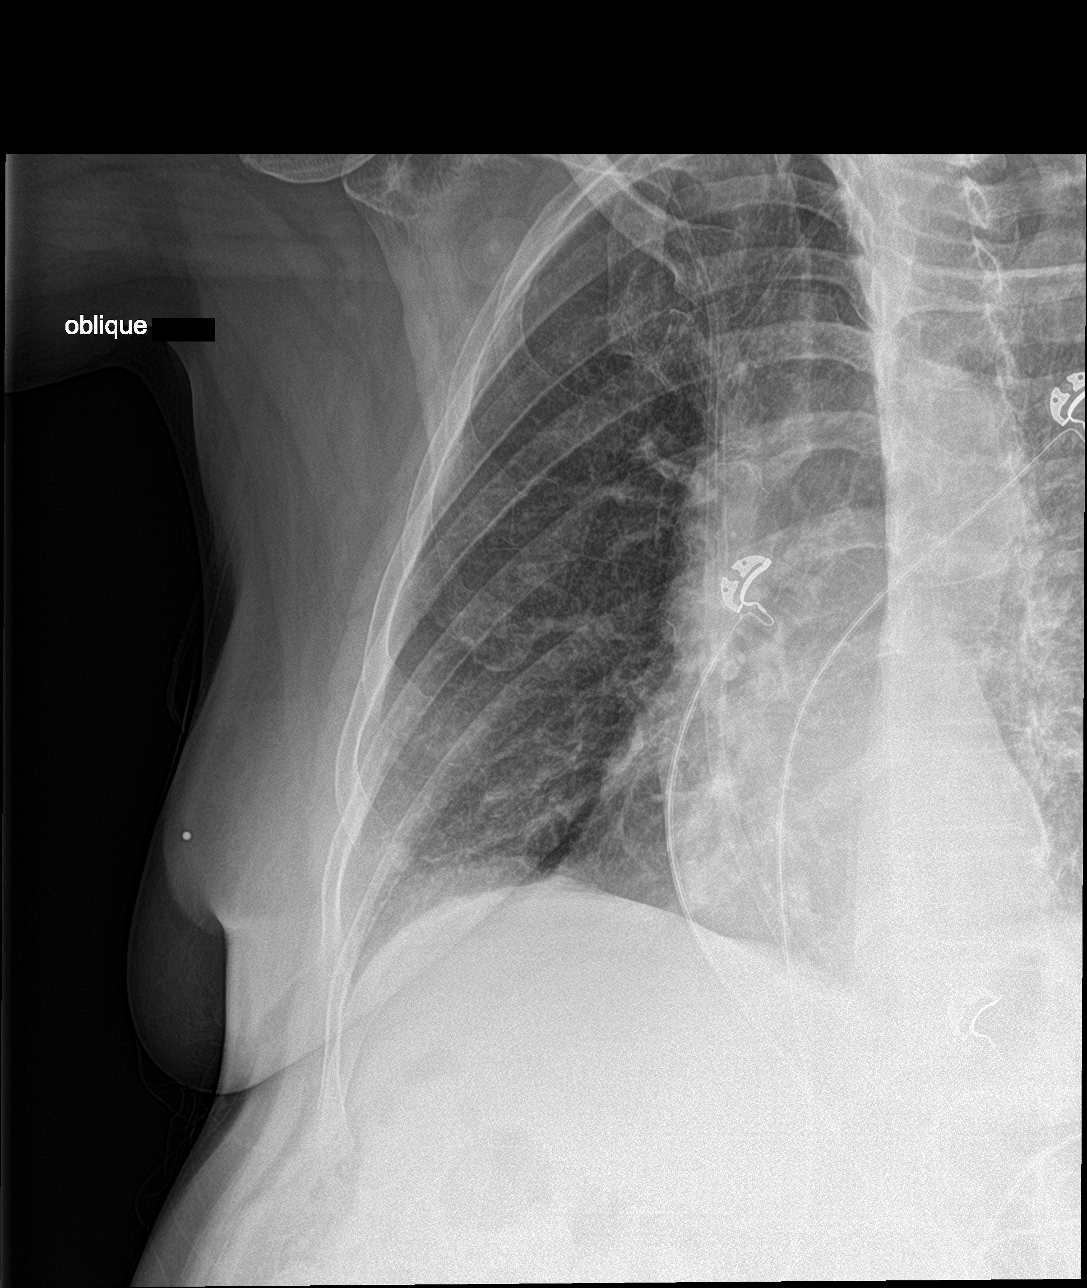

[rib pa obl (2 of 2)]
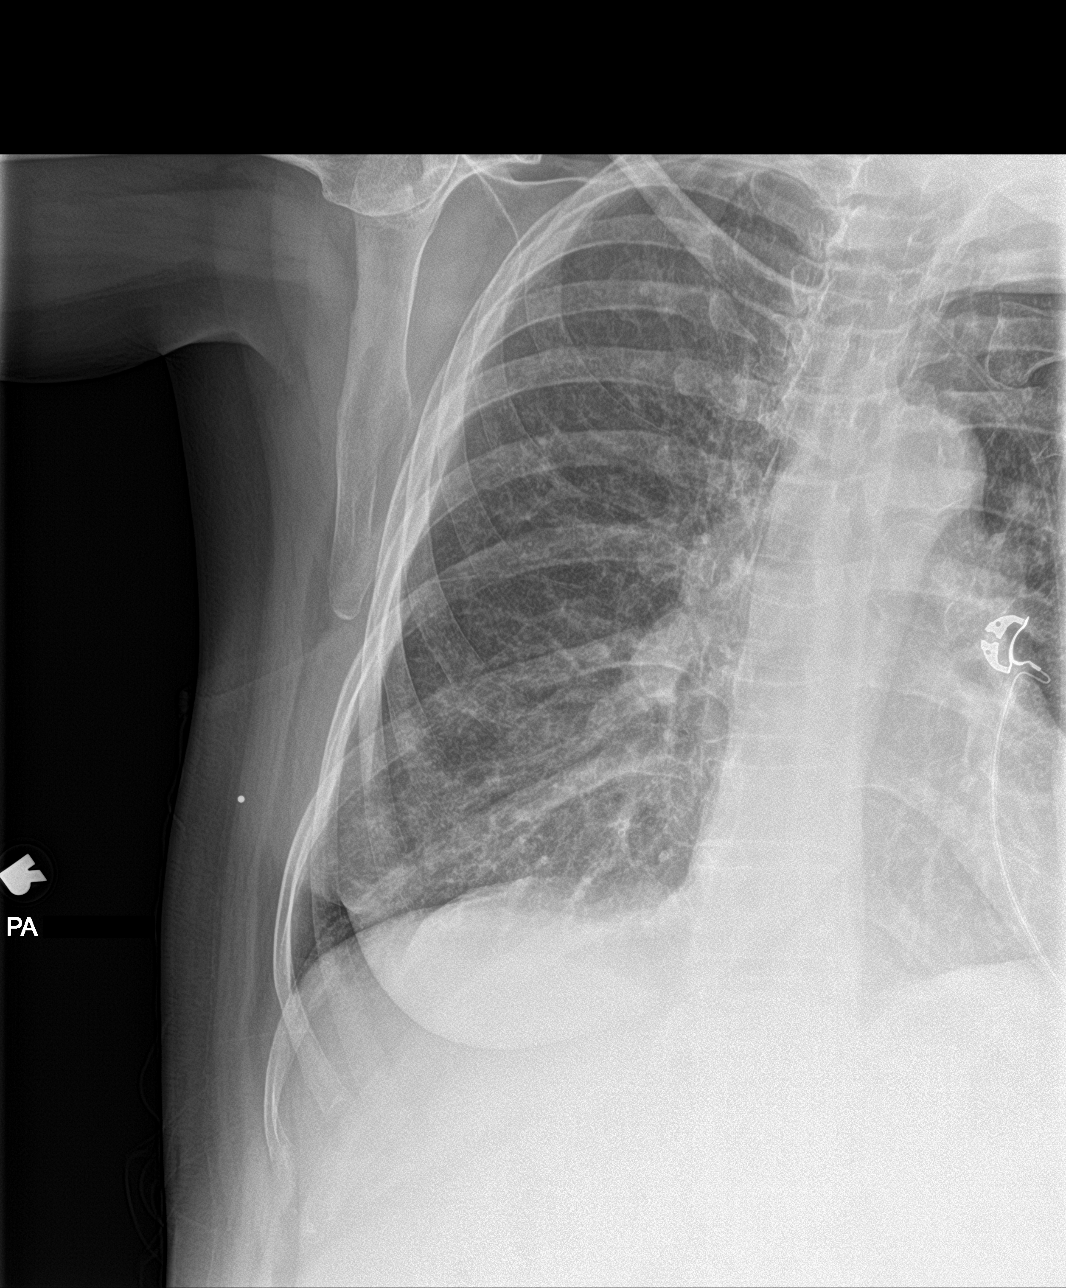

[4 of 4 positions shown; findings below may reference images not displayed]

FINDINGS: No fracture or other bone lesions are seen involving the ribs. There
is no evidence of pneumothorax or pleural effusion.

Patchy bilateral interstitial thickening with lower lobe
predominance.

Heart size and mediastinal contours are within normal limits.
IMPRESSION: 1. No evidence of displaced rib fractures.
2. Patchy bilateral interstitial thickening with lower lobe
predominance, chronic.

## 2020-10-22 ENCOUNTER — Ambulatory Visit (HOSPITAL_COMMUNITY): Admission: RE | Admit: 2020-10-22 | Payer: Medicaid Other | Source: Ambulatory Visit

## 2020-10-29 ENCOUNTER — Encounter (HOSPITAL_COMMUNITY): Payer: Self-pay

## 2020-10-29 ENCOUNTER — Other Ambulatory Visit: Payer: Self-pay

## 2020-10-29 ENCOUNTER — Ambulatory Visit (HOSPITAL_COMMUNITY)
Admission: RE | Admit: 2020-10-29 | Discharge: 2020-10-29 | Disposition: A | Discharge status: Home / Self Care | Payer: Medicaid Other | Source: Ambulatory Visit | Attending: Family Medicine | Admitting: Family Medicine

## 2020-10-29 DIAGNOSIS — M5459 Other low back pain: Secondary | ICD-10-CM

## 2020-12-31 ENCOUNTER — Other Ambulatory Visit: Payer: Self-pay | Admitting: Family Medicine

## 2020-12-31 DIAGNOSIS — M545 Low back pain, unspecified: Secondary | ICD-10-CM

## 2021-01-01 IMAGING — CT CT ABD-PELV W/ CM
2 of 6 series · 15 of 46 positions shown, 17 images · IV contrast (Omnipaque or Isovue)
Comparison: 05/30/2016, CT angio chest of the same date.

CLINICAL DATA: Abdominal abscess and infection is suspected.

EXAM:
CT ABDOMEN AND PELVIS WITH CONTRAST
TECHNIQUE: Multidetector CT imaging of the abdomen and pelvis was performed
using the standard protocol following bolus administration of
intravenous contrast.
CONTRAST:  100mL OMNIPAQUE IOHEXOL 350 MG/ML SOLN

[Series 12: abd/pel w/ · axial · 0.70mm/px · z∈[+25,+390]mm · 12 of 83 slices shown, 14 images]
[im 5/83  soft-tissue]
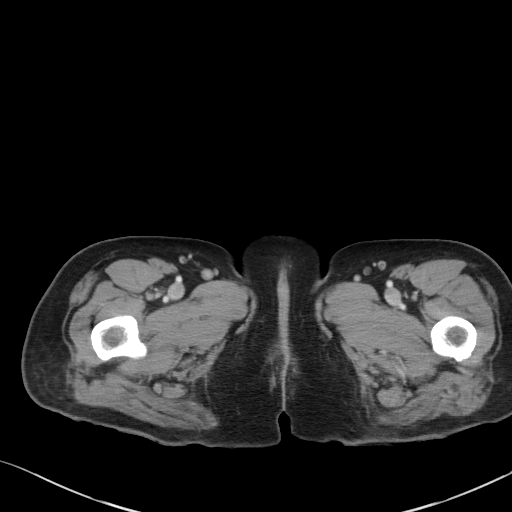
[im 5/83  bone]
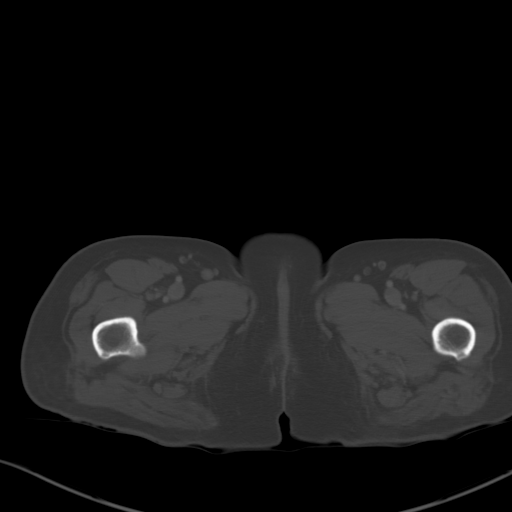
[im 15/83  soft-tissue]
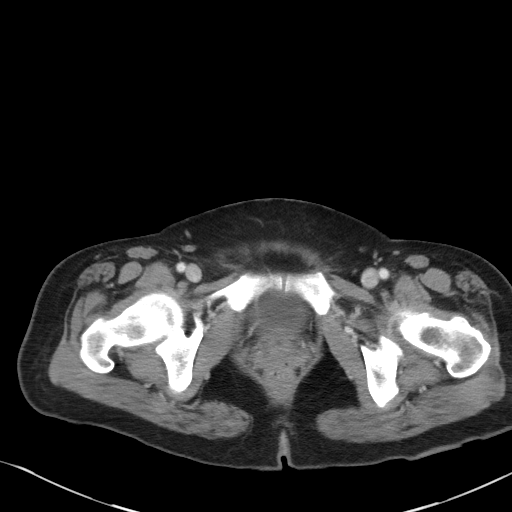
[im 20/83  soft-tissue]
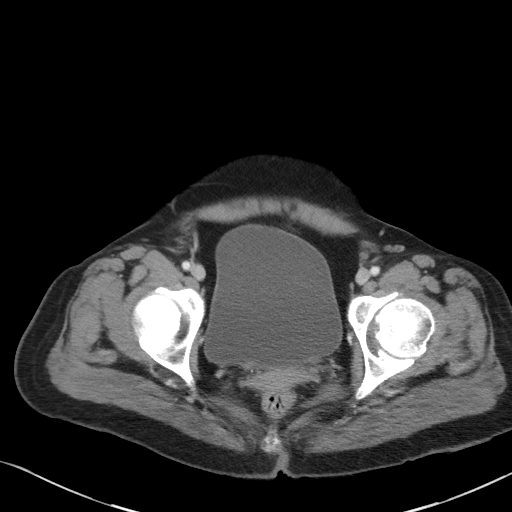
[im 25/83  soft-tissue]
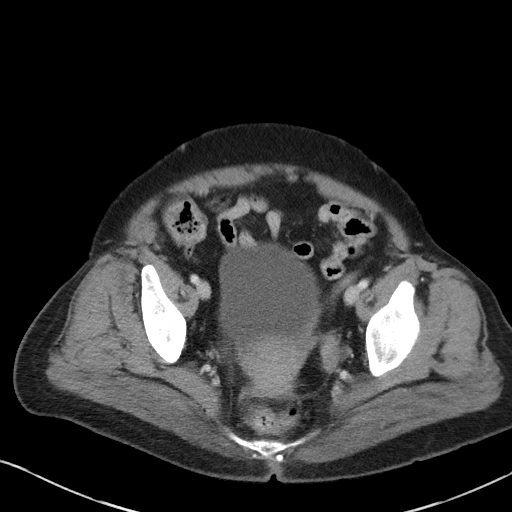
[im 34/83  soft-tissue]
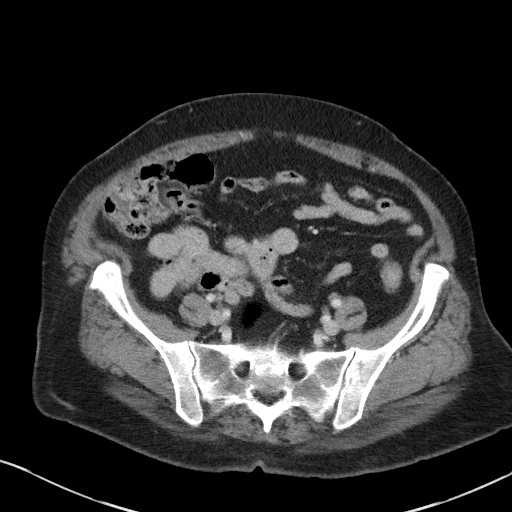
[im 39/83  soft-tissue]
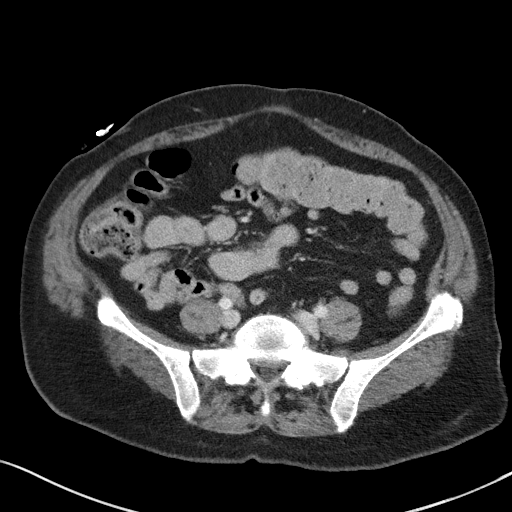
[im 44/83  soft-tissue]
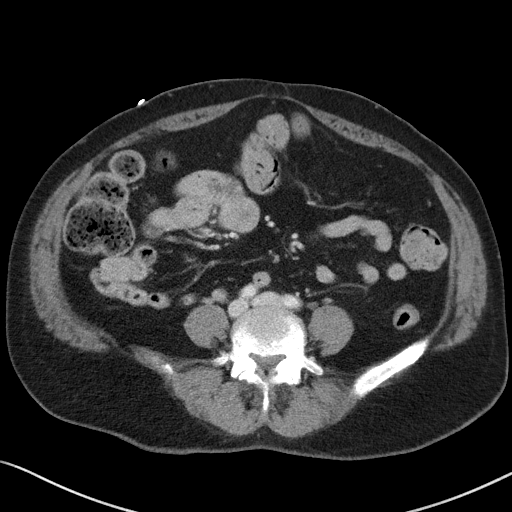
[im 54/83  soft-tissue]
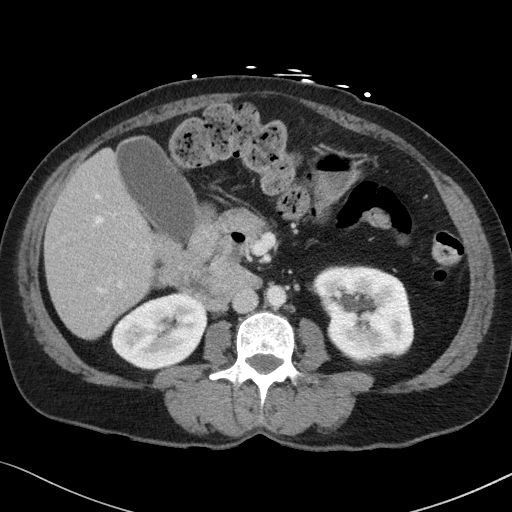
[im 58/83  soft-tissue]
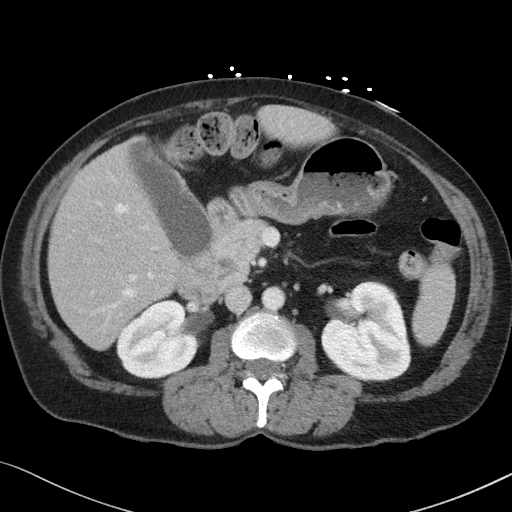
[im 58/83  bone]
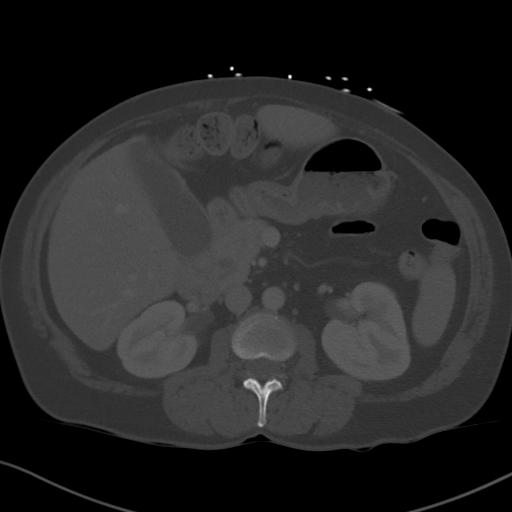
[im 63/83  soft-tissue]
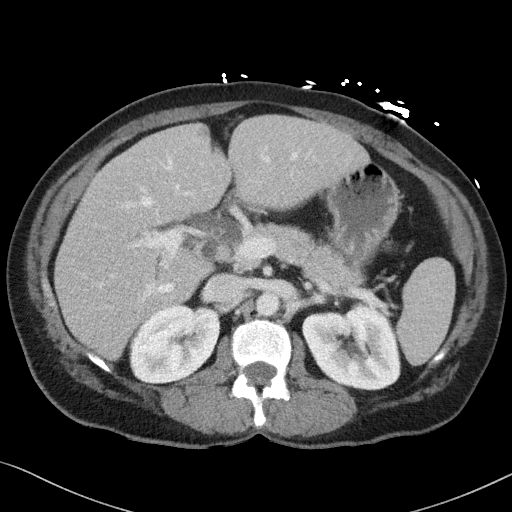
[im 73/83  soft-tissue]
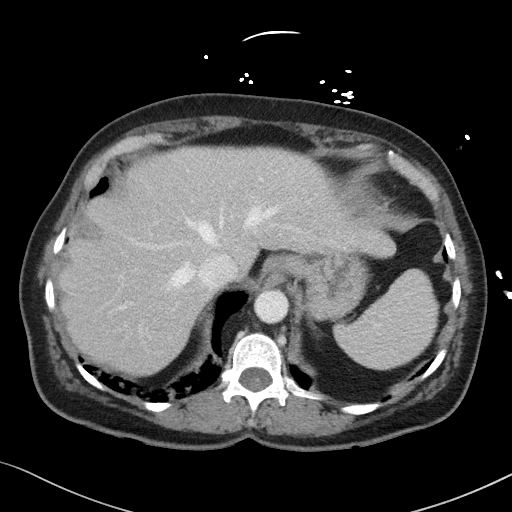
[im 78/83  soft-tissue]
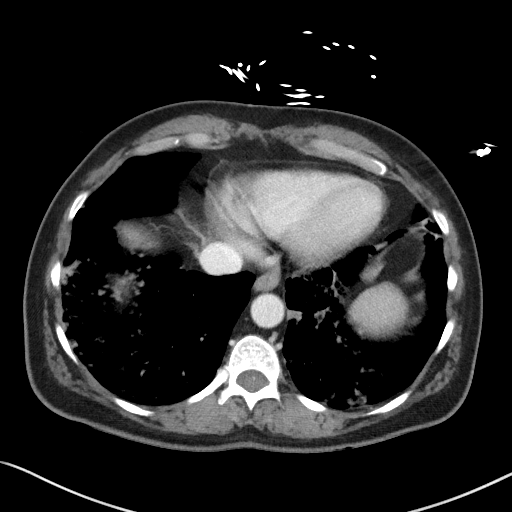

[Series 14: coronal · coronal · 0.67mm/px · 3 of 97 slices shown]
[im 33/97  soft-tissue]
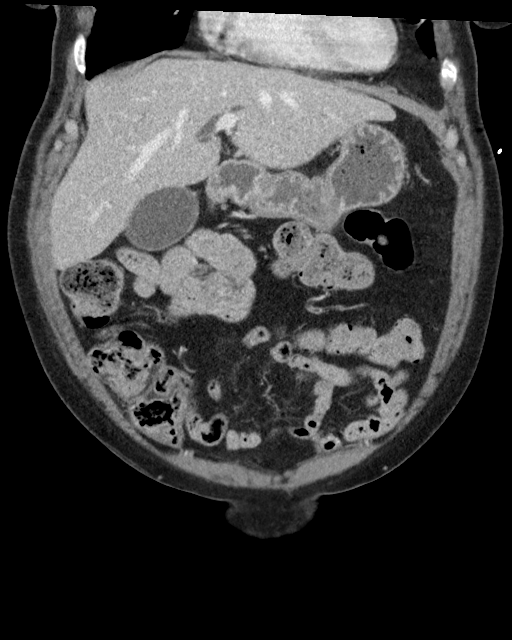
[im 43/97  soft-tissue]
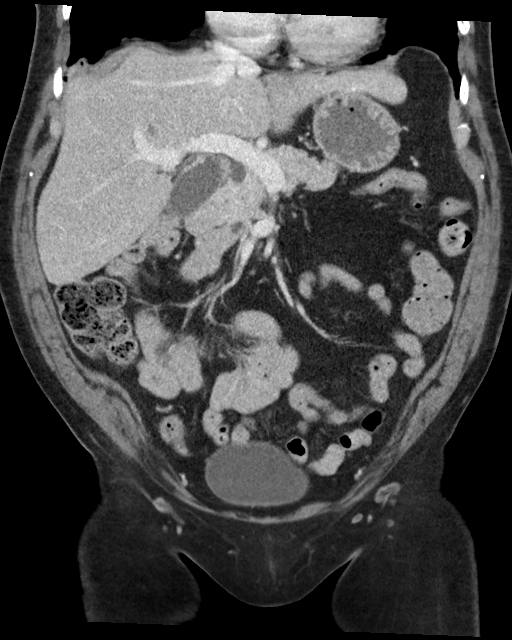
[im 54/97  soft-tissue]
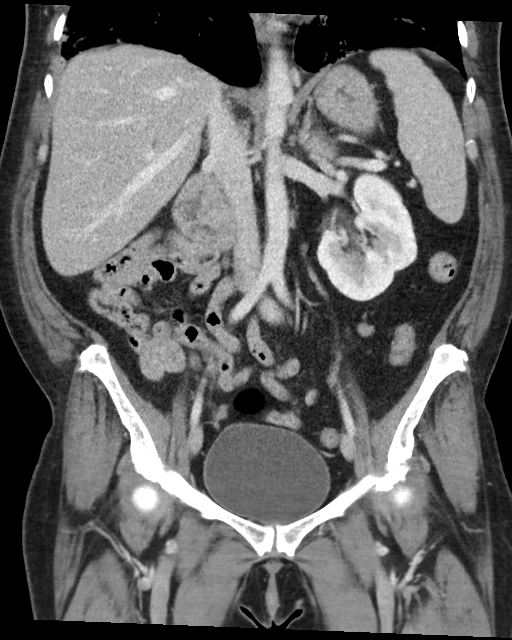

[15 of 46 positions shown; findings below may reference images not displayed]

FINDINGS: Lower chest: Diffuse nodular pattern at the lung bases appears worse
when compared to the prior study., based on comparison with
abdominal imaging of 1066. Please refer to CT of the chest acquired
concurrently, for additional information.

Hepatobiliary: Signs of biliary ductal distension without change
since 1066. Less distention of the gallbladder with mild thickening
of the gallbladder fundus. This area measures approximately 5 mm in
greatest thickness.

Pancreas: Normal appearance of the pancreas.

Spleen: Spleen is unremarkable.

Adrenals/Urinary Tract: Adrenal glands are normal.

Signs of symmetric renal enhancement. Low-density left renal lesion
likely a small cyst in the lower pole also small presumed cyst in
the upper pole.

Stomach/Bowel: Signs of malrotation. Duodenum does not cross
midline. Configuration is unchanged as compared with the study of
1066. Cecum is in the right lower quadrant. The appendix is normal.

Vascular/Lymphatic: SMV and SMA relationship is normal. Scattered
atherosclerosis. No signs of lymphadenopathy in the upper abdomen or
retroperitoneum.

No signs of pelvic lymphadenopathy.

Reproductive: Uterus and adnexa are normal by CT. Urinary bladder is
normal.

Other: No sign of free air or focal fluid collection.

Musculoskeletal: No sign of acute bone finding or evidence of
destructive bone process.
IMPRESSION: 1. No acute findings in the abdomen or pelvis.
2. Diffuse nodular pattern at the lung bases appears worse when
compared to the prior study. Please refer to CT of the chest
acquired concurrently, for additional information. Findings may
represent sequela of chronic and or recurrent infection, atypical
infection should be considered. Given chronicity and worsening
follow-up with pulmonology may be of benefit if not yet performed.
3. Signs of small bowel malrotation without evidence of obstruction
or volvulus.
4. Thickening of the gallbladder fundus is indeterminate,
potentially representing adenomyomatosis,, consider follow-up
sonogram for further assessment upon follow-up to exclude
gallbladder neoplasm though this is felt less likely.
5. Aortic atherosclerosis.

Aortic Atherosclerosis ([id number]).

## 2021-01-01 IMAGING — DX DG CHEST 1V PORT
1 series · 1 of 1 positions shown · non-contrast
Comparison: 09/07/2019

CLINICAL DATA: Shortness of breath

EXAM:
PORTABLE CHEST 1 VIEW

[chest ap]
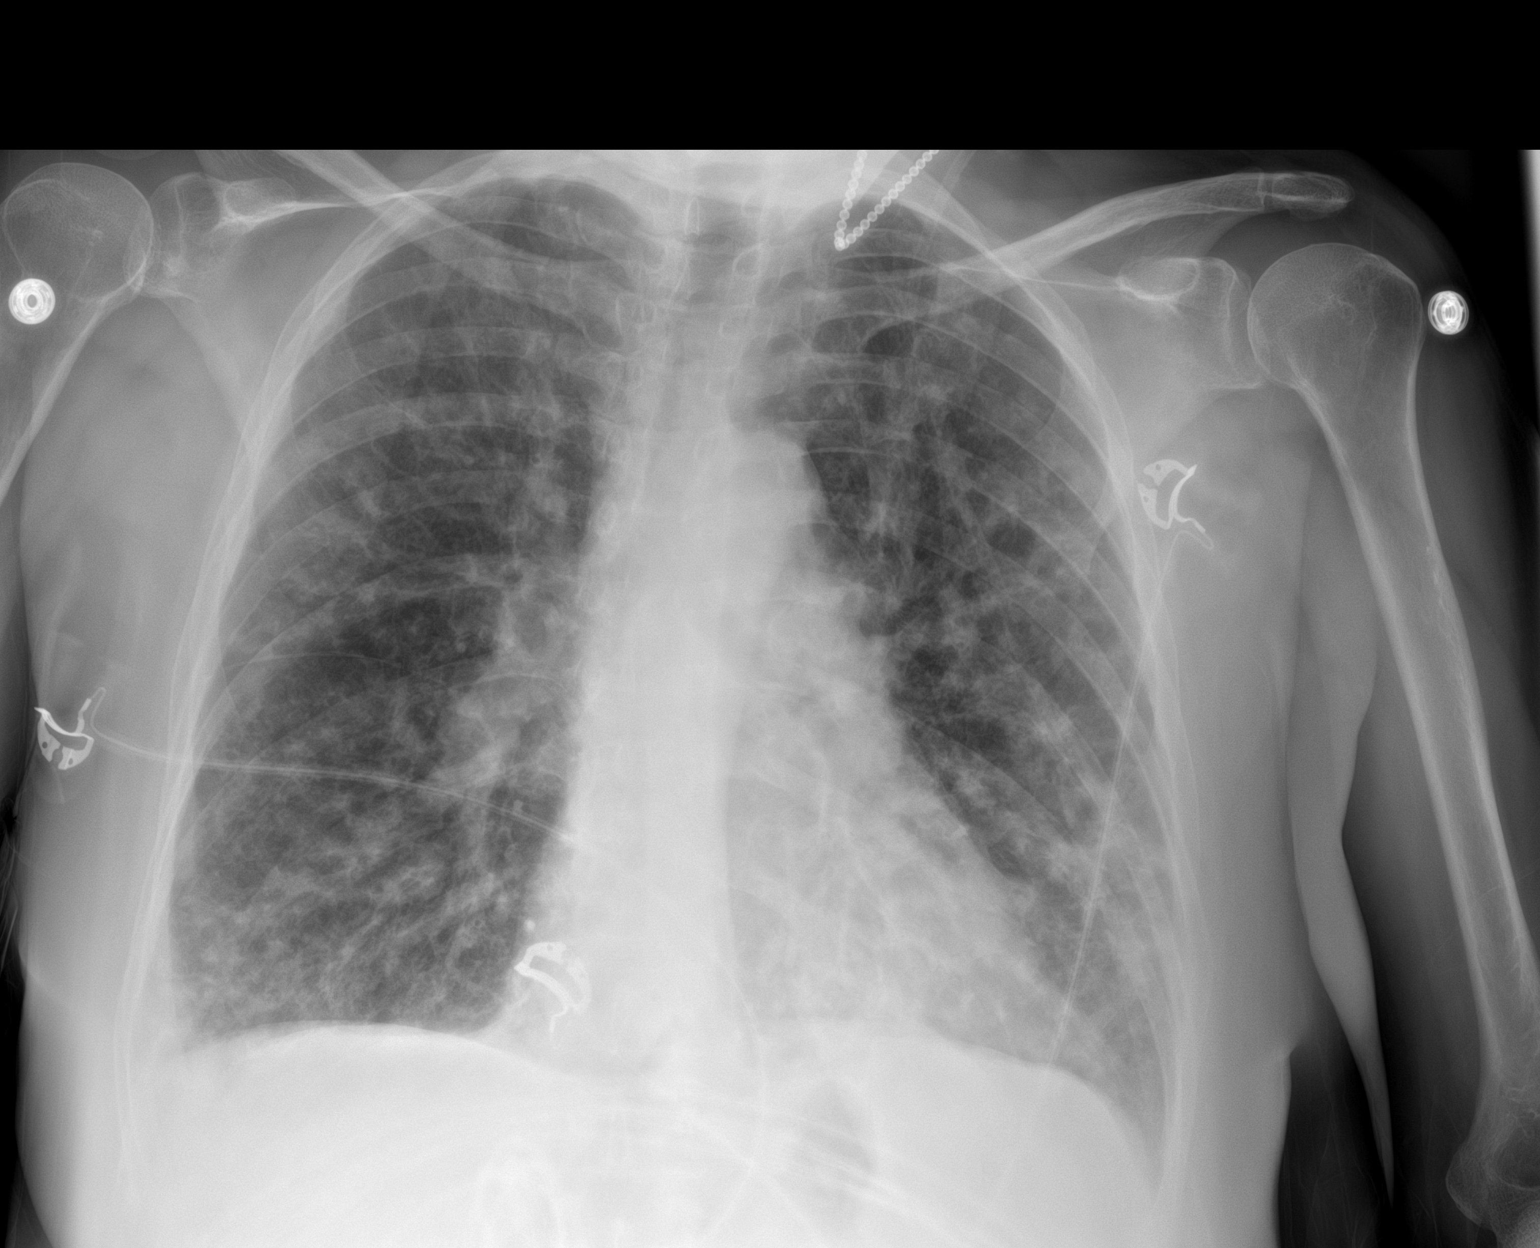

[1 of 1 positions shown; findings below may reference images not displayed]

FINDINGS: Chronic interstitial opacity but progressive reticulonodular density
on both sides. Blunting of the right lateral costophrenic sulcus is
mainly related to overlapping rib, no suspected true pleural
effusion. Hyperinflation without pneumothorax. Normal heart size.
IMPRESSION: Generalized acute on chronic airspace disease with acute findings
presumably reflecting pneumonia.

## 2021-01-01 IMAGING — CT CT ANGIO CHEST
2 of 7 series · 18 of 46 positions shown · IV contrast (Omnipaque or Isovue)
Comparison: September 28, 2016

CLINICAL DATA: Shortness of breath. Chest pain. Elevated D-dimer.

EXAM:
CT ANGIOGRAPHY CHEST WITH CONTRAST
TECHNIQUE: Multidetector CT imaging of the chest was performed using the
standard protocol during bolus administration of intravenous
contrast. Multiplanar CT image reconstructions and MIPs were
obtained to evaluate the vascular anatomy.
CONTRAST:  100mL OMNIPAQUE IOHEXOL 350 MG/ML SOLN

[Series 7: cor soft · coronal · 0.65mm/px · 3 of 126 slices shown]
[im 32/126  soft-tissue]
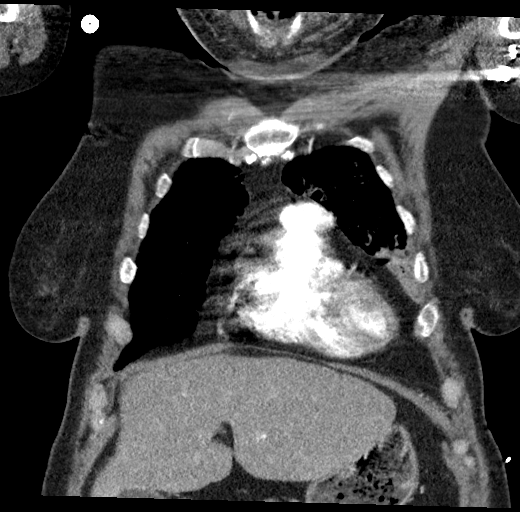
[im 63/126  soft-tissue]
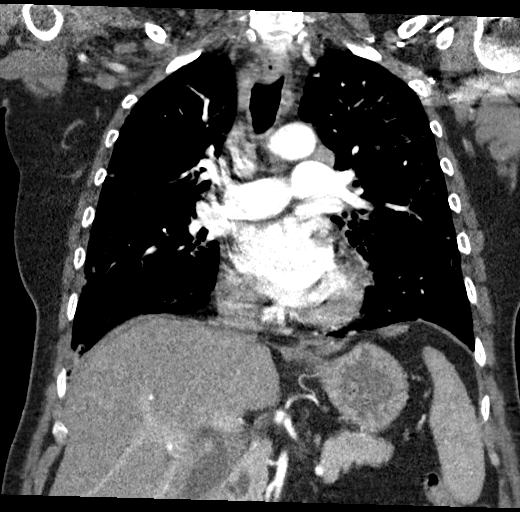
[im 94/126  soft-tissue]
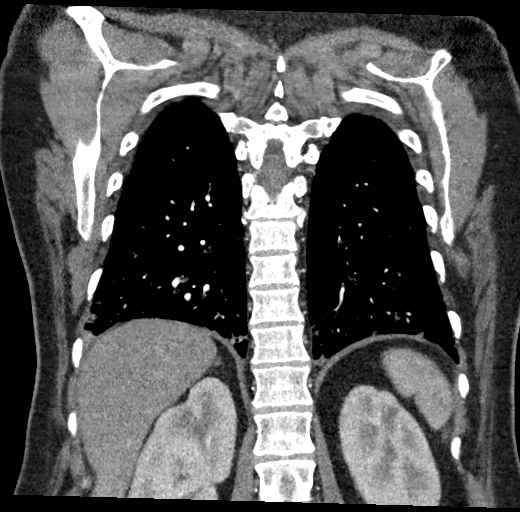

[Series 13: thins · axial · 0.70mm/px · z∈[+24,+395]mm · 15 of 811 slices shown]
[im 41/811  lung]
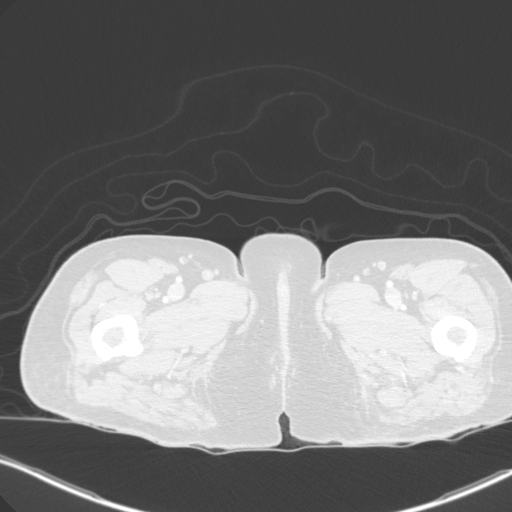
[im 82/811  soft-tissue]
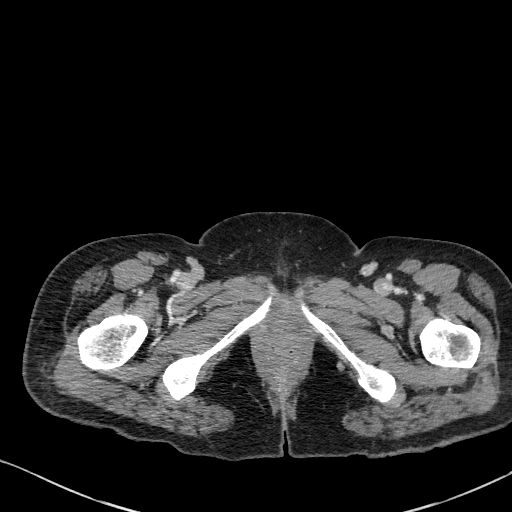
[im 163/811  lung]
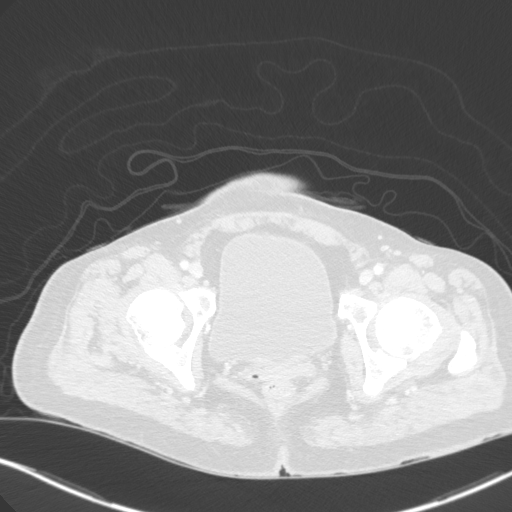
[im 203/811  soft-tissue]
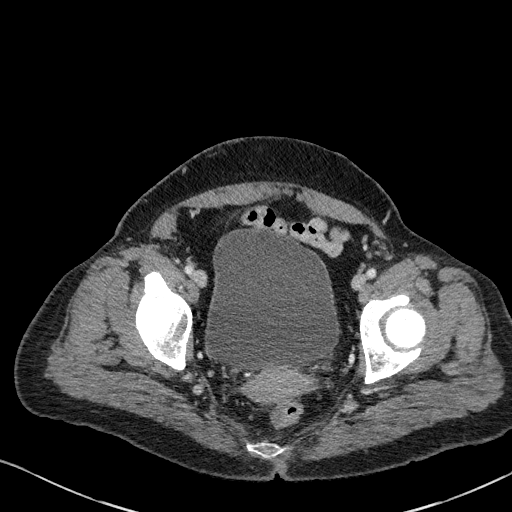
[im 244/811  lung]
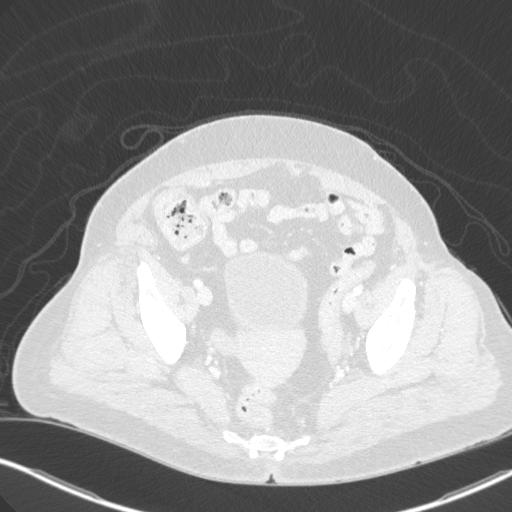
[im 284/811  soft-tissue]
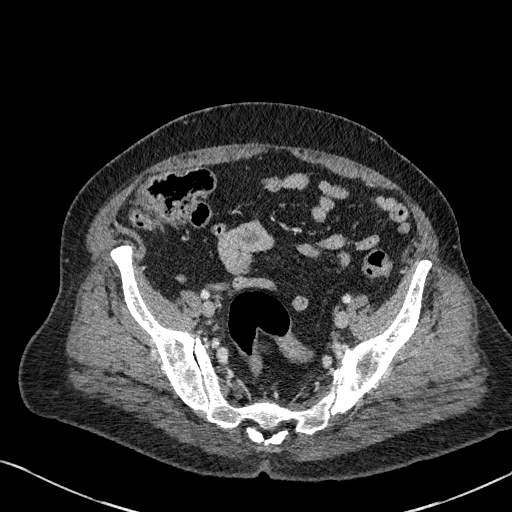
[im 365/811  lung]
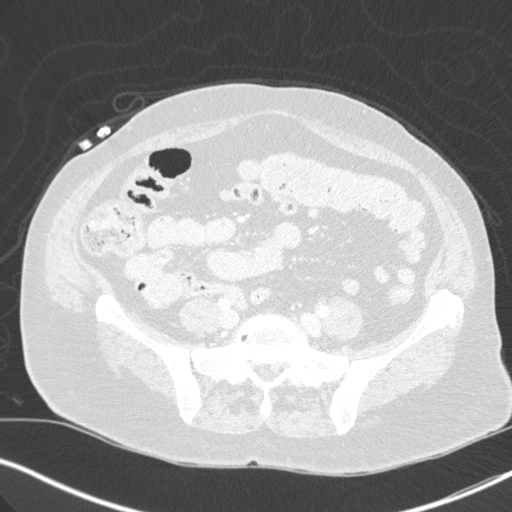
[im 406/811  soft-tissue]
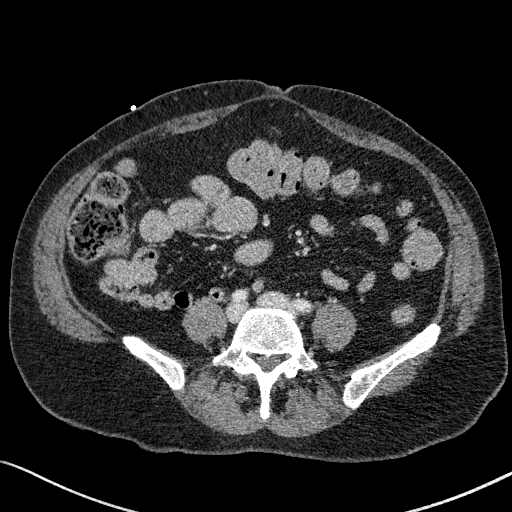
[im 446/811  lung]
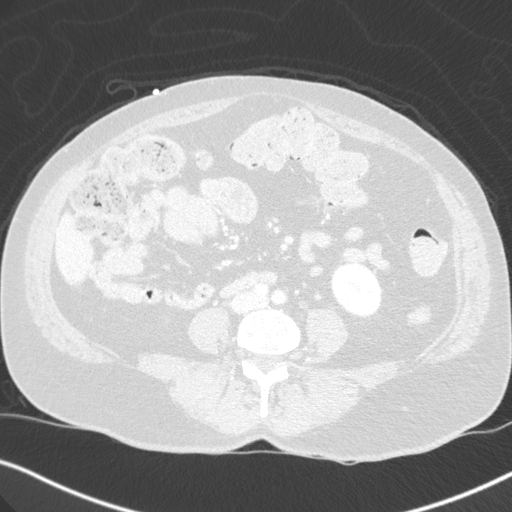
[im 527/811  soft-tissue]
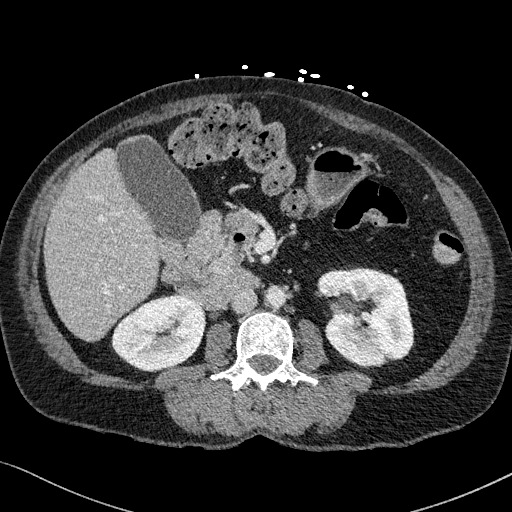
[im 568/811  lung]
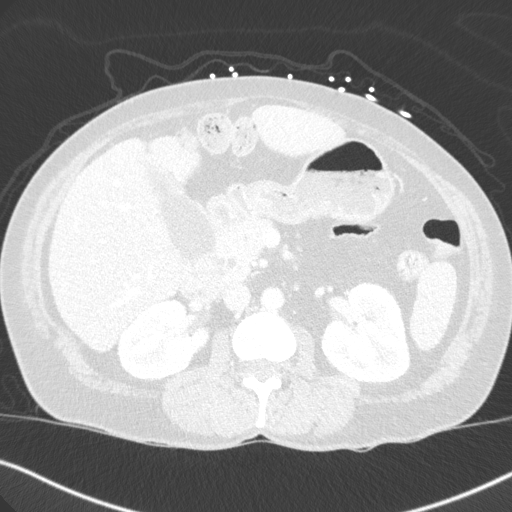
[im 608/811  soft-tissue]
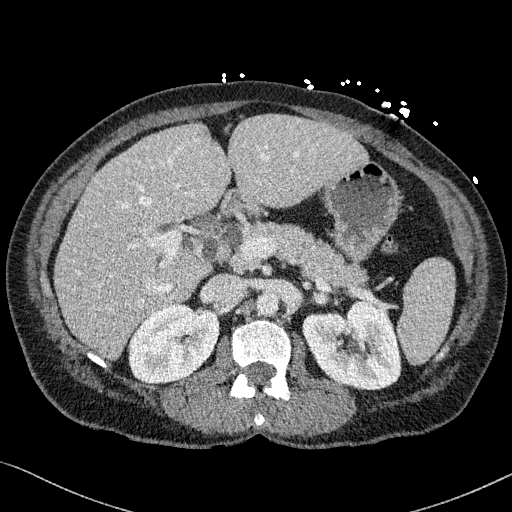
[im 649/811  lung]
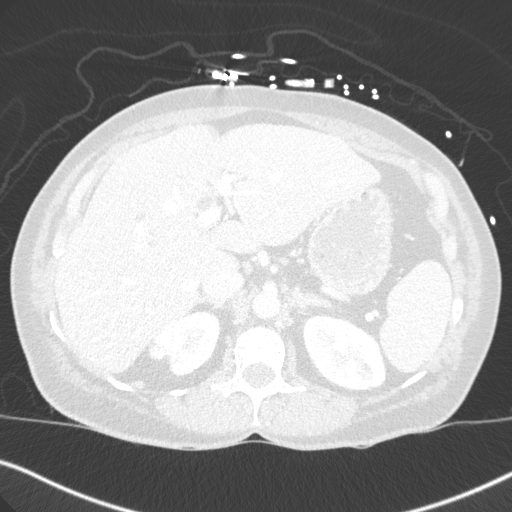
[im 730/811  soft-tissue]
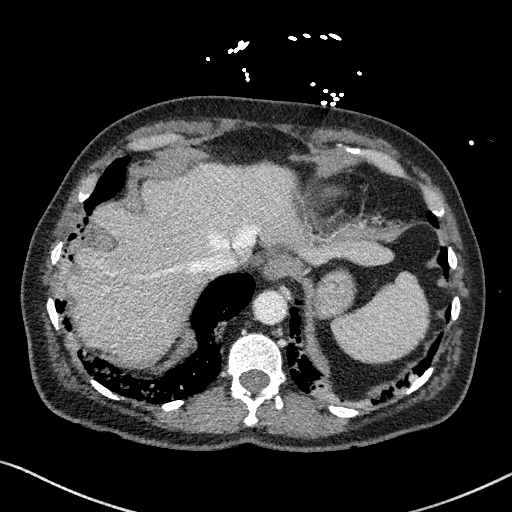
[im 770/811  lung]
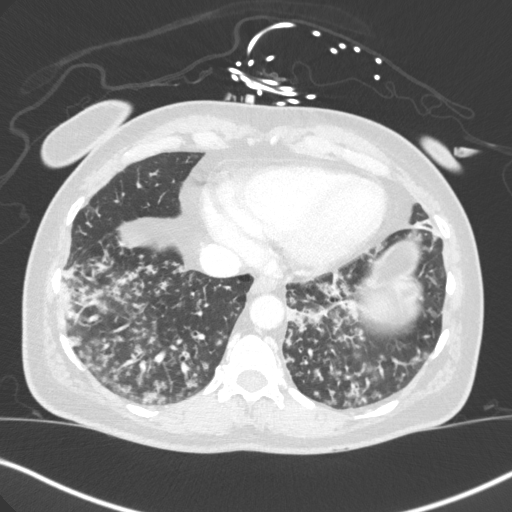

[18 of 46 positions shown; findings below may reference images not displayed]

FINDINGS: Cardiovascular: Contrast injection is sufficient to demonstrate
satisfactory opacification of the pulmonary arteries to the
segmental level. There is no pulmonary embolus. The main pulmonary
artery is within normal limits for size. There is no CT evidence of
acute right heart strain. The visualized aorta is normal. Heart size
is normal, without pericardial effusion.

Mediastinum/Nodes:

--mediastinal and hilar adenopathy is noted.

--No axillary lymphadenopathy.

--No supraclavicular lymphadenopathy.

--Normal thyroid gland.

--The esophagus is unremarkable

Lungs/Pleura: There is scattered bilateral tree-in-bud opacities
bilaterally with more focal areas of consolidation in the lung
bases. There are few nodular opacities bilaterally. Emphysematous
changes are noted. There is no significant pleural effusion. The
trachea is unremarkable.

Upper Abdomen: There is dilatation of the partially visualized
common bile duct. This is relatively similar when compared to prior
CT abdomen in 1482.

Musculoskeletal: No chest wall abnormality. No acute or significant
osseous findings.

Review of the MIP images confirms the above findings.
IMPRESSION: 1. There is no evidence for acute pulmonary embolus.
2. Bilateral tree-in-bud opacities with more focal areas of
consolidation in the lung bases. This may represent an infectious or
inflammatory process. These findings were present on the patient's
1482 CT of the chest but have progressed since that study.
3. Mediastinal and hilar adenopathy, likely reactive.

## 2021-01-03 IMAGING — US US ABDOMEN LIMITED
1 series · 14 of 25 positions shown · non-contrast
Comparison: CT 11/27/2019

CLINICAL DATA: Abdominal pain

EXAM:
ULTRASOUND ABDOMEN LIMITED RIGHT UPPER QUADRANT

[Series 1: us abdomen limited · 14 of 43 slices shown]
[im 1/43]
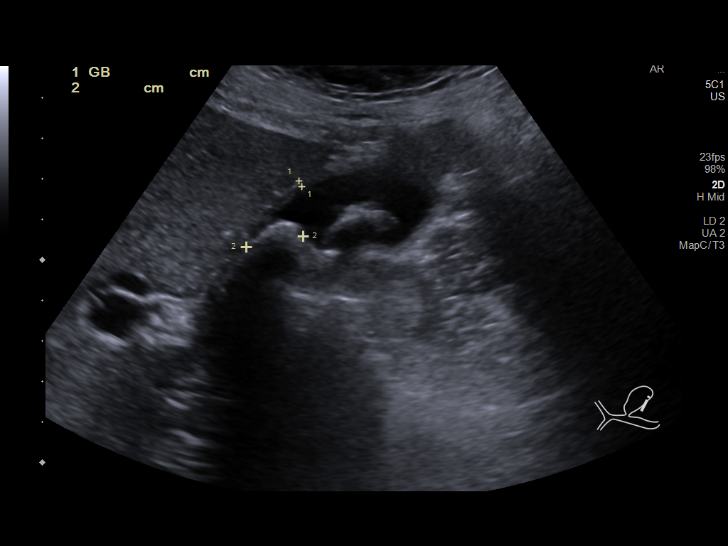
[im 4/43]
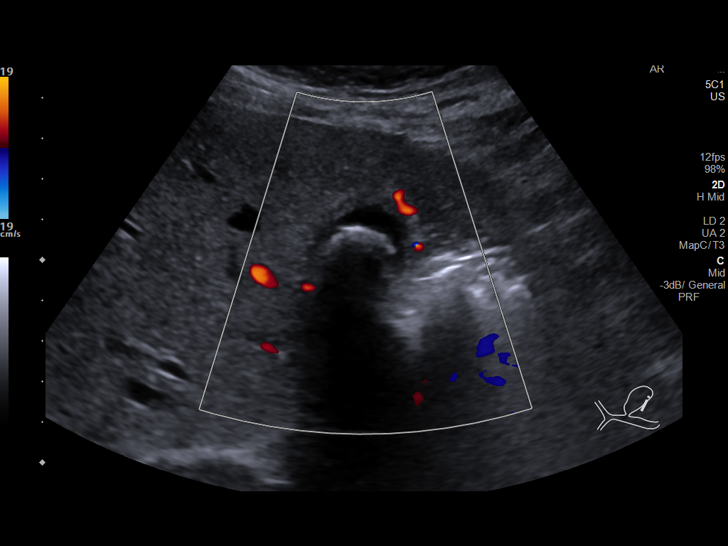
[im 8/43]
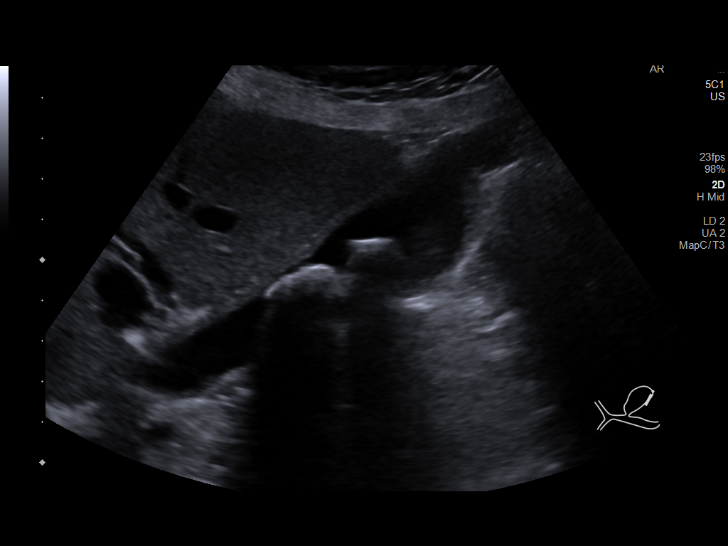
[im 11/43]
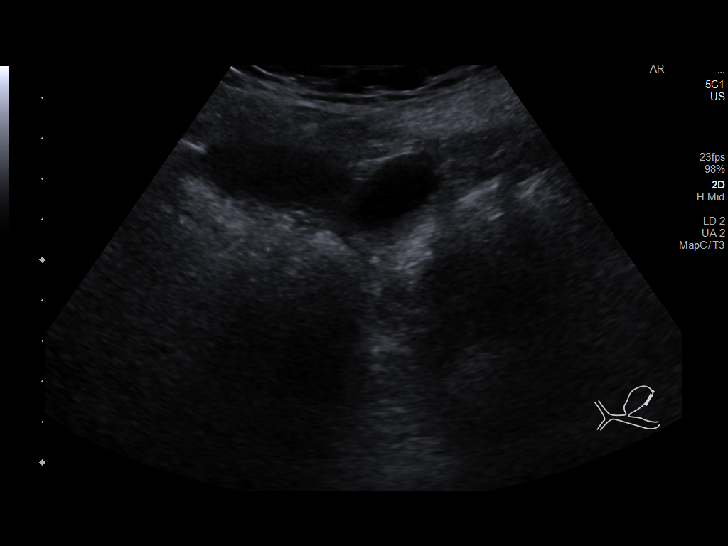
[im 15/43]
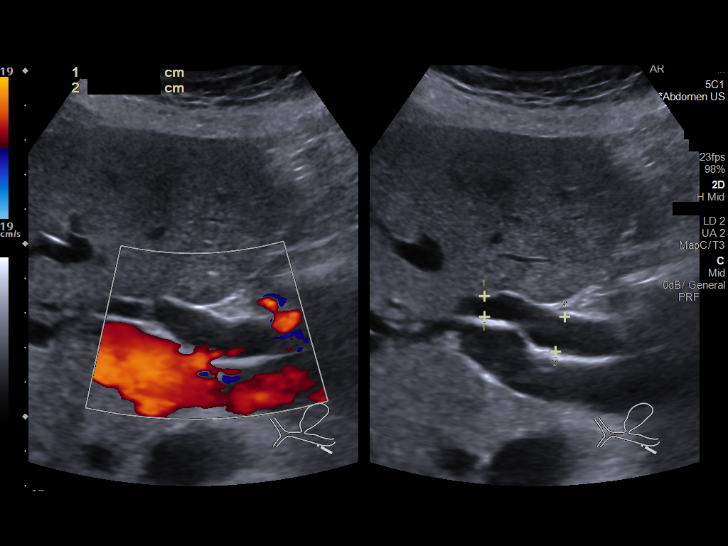
[im 16/43]
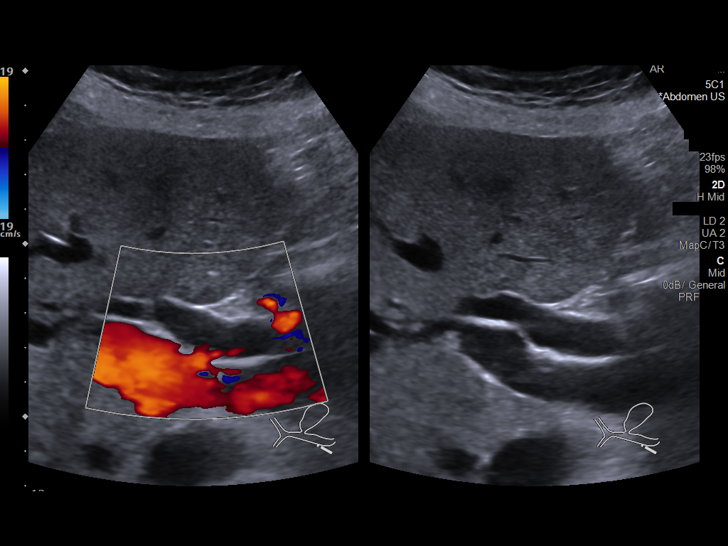
[im 20/43]
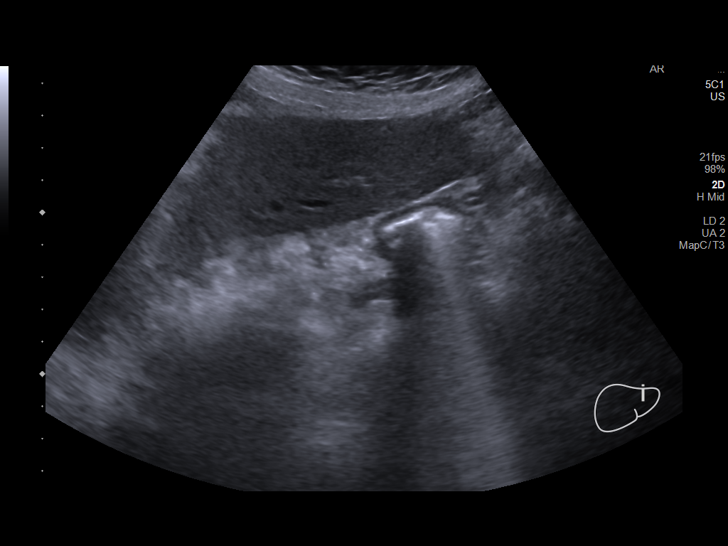
[im 23/43]
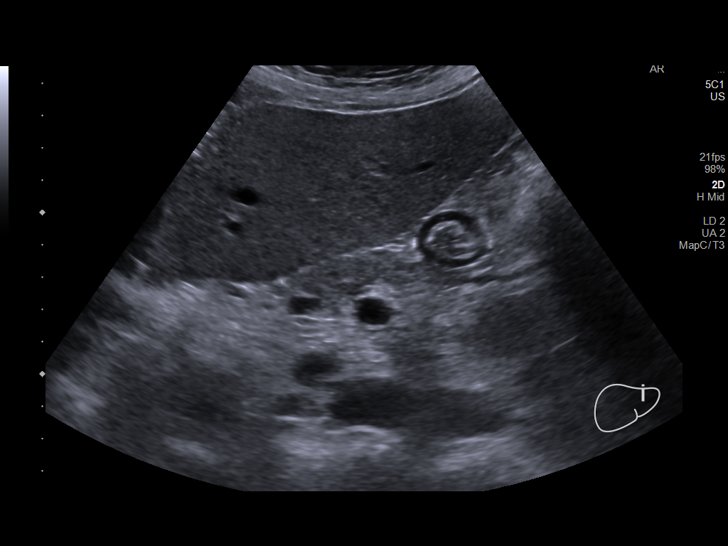
[im 27/43]
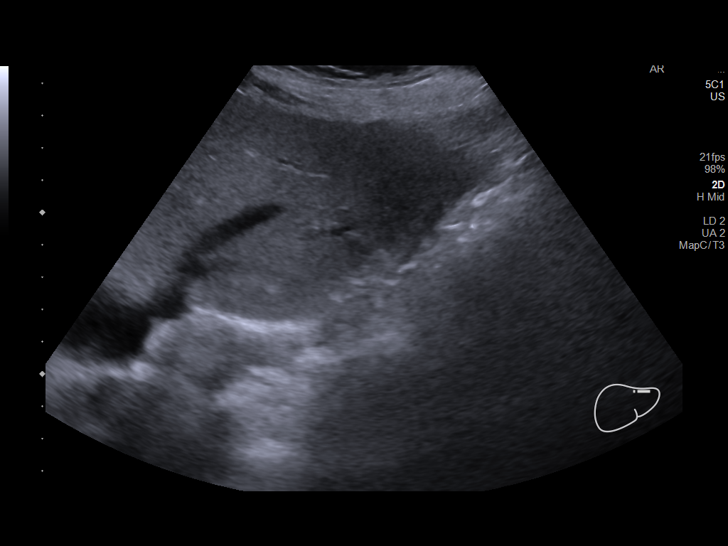
[im 29/43]
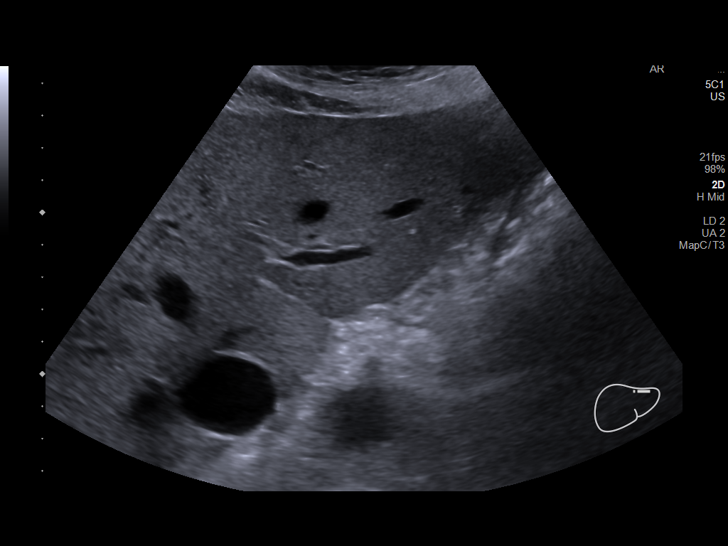
[im 32/43]
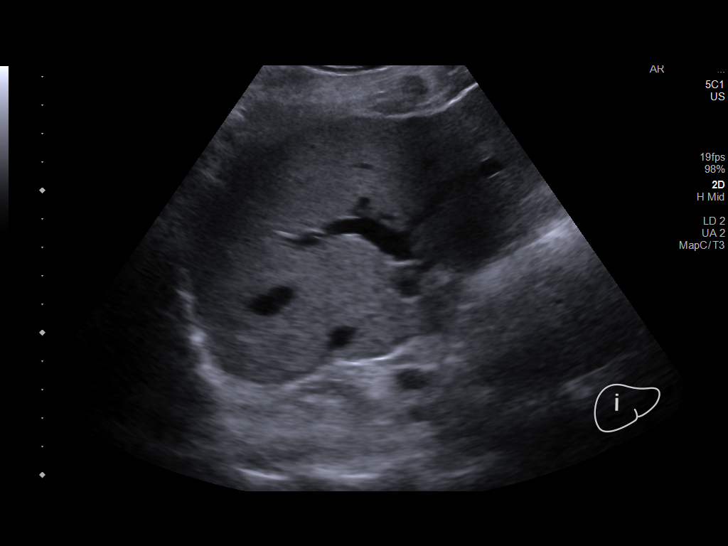
[im 36/43]
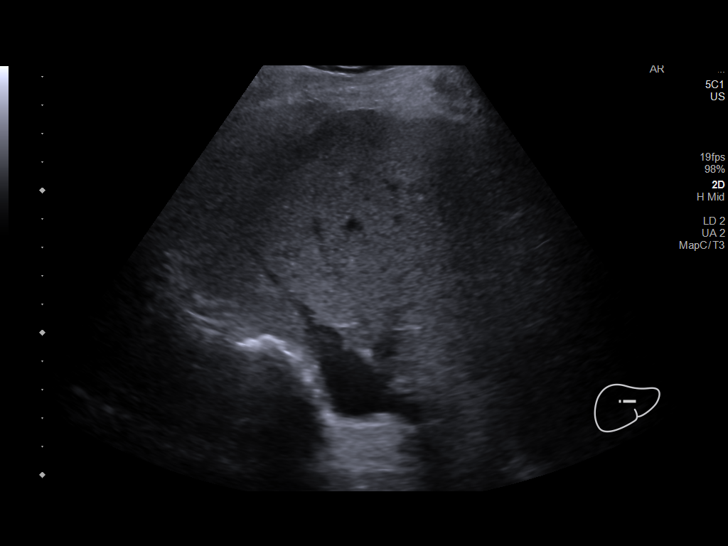
[im 39/43]
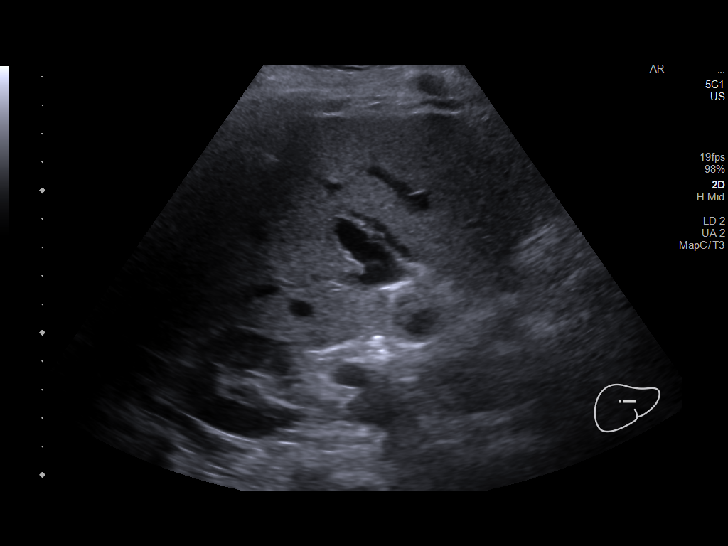
[im 43/43]
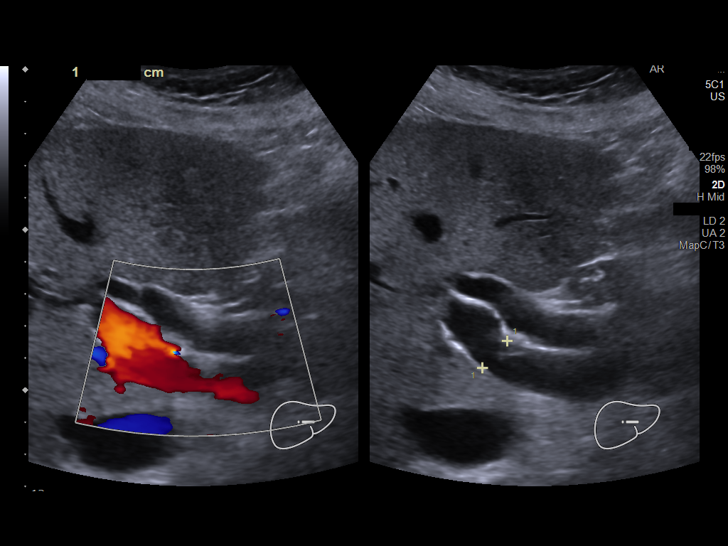

[14 of 25 positions shown; findings below may reference images not displayed]

FINDINGS: Gallbladder:

Multiple mobile gallstones, the largest 14 mm. No wall thickening or
sonographic Murphy sign.

Common bile duct:

Diameter: Dilated, 10 mm. Distal duct cannot be visualized due to
overlying bowel gas.

Liver:

No focal lesion identified. Within normal limits in parenchymal
echogenicity. Portal vein is patent on color Doppler imaging with
normal direction of blood flow towards the liver.

Other: None.
IMPRESSION: Cholelithiasis.  No sonographic evidence of acute cholecystitis.

Dilated common bile duct. This is stable since prior CT. This could
be further evaluated with MRCP to exclude distal obstructing stone
or mass as clinically indicated.

## 2021-01-27 ENCOUNTER — Ambulatory Visit
Admission: RE | Admit: 2021-01-27 | Discharge: 2021-01-27 | Disposition: A | Discharge status: Home / Self Care | Payer: Medicaid Other | Source: Ambulatory Visit | Attending: Family Medicine | Admitting: Family Medicine

## 2021-01-27 DIAGNOSIS — M545 Low back pain, unspecified: Secondary | ICD-10-CM

## 2021-03-02 ENCOUNTER — Ambulatory Visit
Admission: RE | Admit: 2021-03-02 | Discharge: 2021-03-02 | Disposition: A | Discharge status: Home / Self Care | Payer: Medicaid Other | Source: Ambulatory Visit | Attending: Family Medicine | Admitting: Family Medicine

## 2021-03-07 ENCOUNTER — Emergency Department (HOSPITAL_COMMUNITY): Payer: Medicaid Other

## 2021-03-07 ENCOUNTER — Other Ambulatory Visit: Payer: Self-pay

## 2021-03-07 ENCOUNTER — Emergency Department (HOSPITAL_COMMUNITY)
Admission: EM | Admit: 2021-03-07 | Discharge: 2021-03-07 | Disposition: A | Discharge status: Home / Self Care | Payer: Medicaid Other | Attending: Emergency Medicine | Admitting: Emergency Medicine

## 2021-03-07 ENCOUNTER — Encounter (HOSPITAL_COMMUNITY): Payer: Self-pay | Admitting: Emergency Medicine

## 2021-03-07 DIAGNOSIS — J441 Chronic obstructive pulmonary disease with (acute) exacerbation: Secondary | ICD-10-CM | POA: Diagnosis not present

## 2021-03-07 DIAGNOSIS — Z79899 Other long term (current) drug therapy: Secondary | ICD-10-CM | POA: Diagnosis not present

## 2021-03-07 DIAGNOSIS — R000 Tachycardia, unspecified: Secondary | ICD-10-CM | POA: Diagnosis not present

## 2021-03-07 DIAGNOSIS — Z794 Long term (current) use of insulin: Secondary | ICD-10-CM | POA: Diagnosis not present

## 2021-03-07 DIAGNOSIS — F1123 Opioid dependence with withdrawal: Secondary | ICD-10-CM | POA: Insufficient documentation

## 2021-03-07 DIAGNOSIS — Z7984 Long term (current) use of oral hypoglycemic drugs: Secondary | ICD-10-CM | POA: Insufficient documentation

## 2021-03-07 DIAGNOSIS — I10 Essential (primary) hypertension: Secondary | ICD-10-CM | POA: Insufficient documentation

## 2021-03-07 DIAGNOSIS — Z7952 Long term (current) use of systemic steroids: Secondary | ICD-10-CM | POA: Diagnosis not present

## 2021-03-07 DIAGNOSIS — J45901 Unspecified asthma with (acute) exacerbation: Secondary | ICD-10-CM | POA: Diagnosis not present

## 2021-03-07 DIAGNOSIS — J0181 Other acute recurrent sinusitis: Secondary | ICD-10-CM | POA: Insufficient documentation

## 2021-03-07 DIAGNOSIS — F17210 Nicotine dependence, cigarettes, uncomplicated: Secondary | ICD-10-CM | POA: Diagnosis not present

## 2021-03-07 DIAGNOSIS — F1193 Opioid use, unspecified with withdrawal: Secondary | ICD-10-CM

## 2021-03-07 DIAGNOSIS — E119 Type 2 diabetes mellitus without complications: Secondary | ICD-10-CM | POA: Diagnosis not present

## 2021-03-07 DIAGNOSIS — J019 Acute sinusitis, unspecified: Secondary | ICD-10-CM

## 2021-03-07 DIAGNOSIS — R059 Cough, unspecified: Secondary | ICD-10-CM | POA: Diagnosis present

## 2021-03-07 LAB — CBC WITH DIFFERENTIAL/PLATELET
Abs Immature Granulocytes: 0.05 10*3/uL (ref 0.00–0.07)
Basophils Absolute: 0.1 10*3/uL (ref 0.0–0.1)
Basophils Relative: 0 %
Eosinophils Absolute: 0 10*3/uL (ref 0.0–0.5)
Eosinophils Relative: 0 %
HCT: 41.3 % (ref 36.0–46.0)
Hemoglobin: 12.7 g/dL (ref 12.0–15.0)
Immature Granulocytes: 0 %
Lymphocytes Relative: 10 %
Lymphs Abs: 1.7 10*3/uL (ref 0.7–4.0)
MCH: 26.3 pg (ref 26.0–34.0)
MCHC: 30.8 g/dL (ref 30.0–36.0)
MCV: 85.7 fL (ref 80.0–100.0)
Monocytes Absolute: 0.7 10*3/uL (ref 0.1–1.0)
Monocytes Relative: 4 %
Neutro Abs: 15.1 10*3/uL — ABNORMAL HIGH (ref 1.7–7.7)
Neutrophils Relative %: 86 %
Platelets: 508 10*3/uL — ABNORMAL HIGH (ref 150–400)
RBC: 4.82 MIL/uL (ref 3.87–5.11)
RDW: 13 % (ref 11.5–15.5)
WBC: 17.6 10*3/uL — ABNORMAL HIGH (ref 4.0–10.5)
nRBC: 0 % (ref 0.0–0.2)

## 2021-03-07 LAB — BASIC METABOLIC PANEL
Anion gap: 10 (ref 5–15)
BUN: <5 mg/dL — ABNORMAL LOW (ref 6–20)
CO2: 32 mmol/L (ref 22–32)
Calcium: 9.2 mg/dL (ref 8.9–10.3)
Chloride: 93 mmol/L — ABNORMAL LOW (ref 98–111)
Creatinine, Ser: 0.48 mg/dL (ref 0.44–1.00)
GFR, Estimated: >60 mL/min (ref >60)
Glucose, Bld: 149 mg/dL — ABNORMAL HIGH (ref 70–99)
Potassium: 3.6 mmol/L (ref 3.5–5.1)
Sodium: 135 mmol/L (ref 135–145)

## 2021-03-07 MED ORDER — ONDANSETRON 4 MG PO TBDP
4.0000 mg | ORAL_TABLET | Freq: Once | ORAL | Status: AC
  Administered 2021-03-07: 4 mg via ORAL
  Filled 2021-03-07: qty 1

## 2021-03-07 MED ORDER — ONDANSETRON 4 MG PO TBDP: 4.0000 mg | ORAL_TABLET | Freq: Three times a day (TID) | ORAL | 0 refills | Status: DC | PRN

## 2021-03-07 MED ORDER — DOXYCYCLINE HYCLATE 100 MG PO CAPS: 100.0000 mg | ORAL_CAPSULE | Freq: Two times a day (BID) | ORAL | 0 refills | Status: AC

## 2021-03-07 NOTE — ED Notes (Signed)
Pt leaving room, pt states, "I will be back. I have someone in my family here and I am going to walk to their room and I will be back." pt encouraged to remain in her room

## 2021-03-07 NOTE — ED Provider Notes (Signed)
[facility] EMERGENCY DEPARTMENT Provider Note   CSN: [id number] Arrival date & time: 03/07/21  1103     History Chief Complaint  Patient presents with  . Withdrawal    [name] is a 55 y.o. female with a history of COPD (on 3 LPM of O2 via nasal cannula continuously), diabetes mellitus, chronic pain syndrome, depression, GERD.  Patient presents today with chief complaint of running out of her pain medication and sinus pressure.  Patient reports that she has had sinus pressure and increased rhinorrhea over the last 3 days.  Patient reports that her rhinorrhea is yellow to green in nature.  She reports that she had a fever last night of 100.4 orally.  Patient reports cough and shortness of breath at baseline.  Patient denies any change in her cough or shortness of breath.  Patient denies any change in her sputum production.  Patient denies any chest pain, sore throat, loss of smell or taste, abdominal pain, vomiting, diarrhea, constipation.  Patient reports that she has run out of her pain medication.  She last took her medication at 2000 last night.  Patient reports that she takes pain medication for chronic pain syndrome related to low back pain with radiation into her right leg.  Patient reports that her pain has been worse over the last few weeks requiring her to take more of her medication.  Patient states that she had a MRI of her lumbar spine on 03/02/2021.  Patient denies any numbness or tingling to extremities, or bladder dysfunction, or anesthesia.  Per chart review patient had prescription for 5 mg oxycodone filled on 02/14/2021.  Patient was prescribed 120 pills.  Prescription was written by Dr. Janna Arch.  Patient reports she described contacting this provider however was unable to get through.  Patient denies any suicidal ideations, homicidal ideations, auditory hallucinations, visual hallucinations.  HPI     Past Medical History:  Diagnosis Date  . Angina   . Anxiety  state 10/15/2015  . ARDS (adult respiratory distress syndrome) [facility])    Jan 2011  . Asthma   . Chronic back pain   . COPD (chronic obstructive pulmonary disease) (HCC)   . Diabetes mellitus   . GERD (gastroesophageal reflux disease) 12/21/2012  . HTN (hypertension) 10/15/2015  . Hyperglycemia, drug-induced    steroid induced hyperglycemia  . Migraine headache   . On home O2   . Pneumonia   . Recurrent upper respiratory infection (URI)   . Shortness of breath     Patient Active Problem List   Diagnosis Date Noted  . Chronic respiratory failure with hypoxia and hypercapnia (HCC) 04/23/2020  . Hoarseness or changing voice 01/11/2020  . Nausea and vomiting 01/11/2020  . Acute on chronic respiratory failure with hypoxia and hypercapnia (HCC) 11/27/2019  . Chronic pain syndrome 11/27/2019  . Lobar pneumonia (HCC) 11/27/2019  . Atypical pneumonia 09/28/2016  . Chronic obstructive pulmonary disease (HCC)   . Pneumonia 05/30/2016  . SIRS (systemic inflammatory response syndrome) (HCC) 10/16/2015  . Sepsis (HCC) 10/15/2015  . Anxiety state 10/15/2015  . Essential hypertension 10/15/2015  . Hypokalemia 10/15/2015  . COPD with acute exacerbation (HCC) 08/14/2014  . Acute-on-chronic respiratory failure (HCC) 03/13/2014  . CAP (community acquired pneumonia) 03/11/2014  . Community acquired pneumonia 03/11/2014  . COPD exacerbation (HCC) 04/22/2013  . GERD (gastroesophageal reflux disease) 12/21/2012  . Constipation 12/21/2012  . Healthcare-associated pneumonia 02/03/2012  . Candida infection 11/26/2011  . Viral syndrome 11/21/2011  . DM (diabetes mellitus) (  HCC) 11/21/2011  . Oral candidiasis 07/03/2011  . Low back pain 06/16/2011  . Muscle spasms of lower extremity 06/16/2011  . Cigarette smoker 09/12/2010  . 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  02/14/2010  . UNSPECIFIED NUTRITIONAL DEFICIENCY 02/13/2010  .  ADULT RESPIRATORY DISTRESS SYNDROME 02/13/2010  . DELIRIUM 02/13/2010    Past Surgical History:  Procedure Laterality Date  . c-section    . TRACHEOSTOMY     decannulated 12/2009  . TUBAL LIGATION    . uterine ablasion       OB History   No obstetric history on file.     Family History  Problem Relation Age of Onset  . Coronary artery disease Brother   . Diabetes Other   . Cancer Other   . Hypertension Other     Social History   Tobacco Use  . Smoking status: Current Every Day Smoker    Packs/day: 0.50    Years: 42.00    Pack years: 21.00    Types: Cigarettes  . Smokeless tobacco: Never Used  Vaping Use  . Vaping Use: Never used  Substance Use Topics  . Alcohol use: No  . Drug use: No    Comment: Formerly used cocaine , narcotics, THC    Home Medications Prior to Admission medications   Medication Sig Start Date End Date Taking? Authorizing Provider  acetaminophen (TYLENOL) 500 MG tablet Take 500 mg by mouth every 6 (six) hours as needed for fever.     [provider]  albuterol (PROVENTIL) (2.5 MG/3ML) 0.083% nebulizer solution Take 3 mLs (2.5 mg total) by nebulization every 6 (six) hours as needed for wheezing or shortness of breath. 09/27/20   Bethann Berkshire, MD  Budeson-Glycopyrrol-Formoterol (BREZTRI AEROSPHERE) 160-9-4.8 MCG/ACT AERO Inhale 2 puffs into the lungs 2 (two) times daily. 04/23/20   Nyoka Cowden, MD  cefdinir (OMNICEF) 300 MG capsule Take 1 capsule (300 mg total) by mouth 2 (two) times daily. 09/27/20   Bethann Berkshire, MD  clotrimazole (MYCELEX) 10 MG troche Take 1 tablet (10 mg total) by mouth 5 (five) times daily. 04/23/20   Nyoka Cowden, MD  fluticasone (FLONASE) 50 MCG/ACT nasal spray Place 2 sprays into both nostrils daily. 04/13/18   [provider]  glucose blood test strip 1 each by Other route 3 (three) times daily. Use as instructed, accucheck test strips 01/04/20   Corum, Minerva Fester, MD  Insulin Glargine (LANTUS) 100 UNIT/ML  Solostar Pen Inject 15 Units into the skin daily. 12/02/19   Cleora Fleet, MD  Insulin Pen Needle 31G X 5 MM MISC Use as directed 03/12/20   Corum, Minerva Fester, MD  irbesartan (AVAPRO) 150 MG tablet Take 1 tablet (150 mg total) by mouth daily. 05/03/20   Nyoka Cowden, MD  metFORMIN (GLUCOPHAGE) 500 MG tablet Take 2 tablets (1,000 mg total) by mouth 2 (two) times daily. 01/04/20   Corum, Minerva Fester, MD  pantoprazole (PROTONIX) 40 MG tablet Take 1 tablet (40 mg total) by mouth daily. 03/11/20   Corum, Minerva Fester, MD  SUMAtriptan (IMITREX) 50 MG tablet Take 1 tablet (50 mg total) by mouth daily as needed for migraine or headache. 03/11/20   Corum, Minerva Fester, MD  ARIPiprazole (ABILIFY) 5 MG tablet Take 5 mg by mouth daily.    02/03/12  [provider]  venlafaxine (EFFEXOR) 75 MG tablet Take 75 mg by mouth daily.    02/03/12  [provider]    Allergies    Penicillins, Levaquin [levofloxacin in d5w], Doxycycline, and Morphine  Review of Systems   Review of Systems  Constitutional: Positive for fever. Negative for chills.  HENT: Positive for congestion and rhinorrhea. Negative for sore throat.   Eyes: Negative for visual disturbance.  Respiratory: Positive for cough (baseline\) and shortness of breath (baseline).   Cardiovascular: Negative for chest pain.  Gastrointestinal: Positive for nausea. Negative for abdominal distention, abdominal pain, anal bleeding, blood in stool, constipation, diarrhea, rectal pain and vomiting.  Genitourinary: Negative for difficulty urinating and dysuria.  Musculoskeletal: Positive for back pain (chronic lumbar). Negative for neck pain.  Skin: Negative for color change and rash.  Neurological: Negative for dizziness, syncope, light-headedness and headaches.  Psychiatric/Behavioral: Negative for confusion, hallucinations and suicidal ideas.    Physical Exam Updated Vital Signs BP (!) 140/94 (BP Location: Left Arm)   Pulse (!) 120   Temp 98.4 F (36.9 C)  (Oral)   Resp 20   Ht 5' (1.524 m)   Wt 52.6 kg   SpO2 97% Comment: 3 L [place]  BMI 22.65 kg/m   Physical Exam Vitals and nursing note reviewed.  Constitutional:      General: She is not in acute distress.    Appearance: She is not ill-appearing, toxic-appearing or diaphoretic.     Interventions: Nasal cannula in place.     Comments: 3 L via nasal cannula  HENT:     Head: Normocephalic and atraumatic. No raccoon eyes, abrasion, masses, right periorbital erythema or left periorbital erythema. Hair is normal.     Nose: No nasal deformity or laceration.     Right Nostril: No foreign body, epistaxis, septal hematoma or occlusion.     Left Nostril: No foreign body, epistaxis, septal hematoma or occlusion.     Right Turbinates: Not enlarged, swollen or pale.     Left Turbinates: Not enlarged, swollen or pale.     Right Sinus: Maxillary sinus tenderness and frontal sinus tenderness present.     Left Sinus: Maxillary sinus tenderness and frontal sinus tenderness present.  Eyes:     General: No scleral icterus.       Right eye: No discharge.        Left eye: No discharge.     Extraocular Movements: Extraocular movements intact.  Cardiovascular:     Rate and Rhythm: Tachycardia present.     Heart sounds: Normal heart sounds.     Comments: Tachycardic at rate of 120 Pulmonary:     Effort: Pulmonary effort is normal. No respiratory distress.     Breath sounds: No stridor. Examination of the right-upper field reveals wheezing. Examination of the left-upper field reveals wheezing. Examination of the right-middle field reveals wheezing. Examination of the left-middle field reveals wheezing. Examination of the right-lower field reveals wheezing. Examination of the left-lower field reveals wheezing. Wheezing (Expiratory) present.  Abdominal:     Palpations: Abdomen is soft.     Tenderness: There is no abdominal tenderness.  Musculoskeletal:     Cervical back: Normal range of motion and neck  supple.     Right lower leg: Normal.     Left lower leg: Normal.  Skin:    General: Skin is warm and dry.  Neurological:     General: No focal deficit present.     Mental Status: She is alert.     GCS: GCS eye subscore is 4. GCS verbal subscore is 5. GCS motor subscore is 6.  Psychiatric:        Attention and Perception: She is attentive. She does not perceive auditory or visual hallucinations.        Behavior: Behavior is cooperative.        Thought Content: Thought content is not paranoid or delusional. Thought content does not include homicidal or suicidal ideation. Thought content does not include homicidal or suicidal plan.     ED Results / Procedures / Treatments   Labs (all labs ordered are listed, but only abnormal results are displayed) Labs Reviewed  BASIC METABOLIC PANEL - Abnormal; Notable for the following components:      Result Value   Chloride 93 (*)    Glucose, Bld 149 (*)    BUN <5 (*)    All other components within normal limits  CBC WITH DIFFERENTIAL/PLATELET - Abnormal; Notable for the following components:   WBC 17.6 (*)    Platelets 508 (*)    Neutro Abs 15.1 (*)    All other components within normal limits    EKG None  Radiology DG Chest Portable 1 View  Result Date: 03/07/2021 CLINICAL DATA:  Cough. EXAM: PORTABLE CHEST 1 VIEW COMPARISON:  09/27/2020 FINDINGS: Lungs are adequately inflated with mild stable chronic interstitial changes. No focal acute airspace process or effusion. Cardiomediastinal silhouette and remainder of the exam is unchanged. IMPRESSION: 1. No acute disease. 2. Mild stable chronic interstitial changes. Electronically Signed   By: Elberta Fortis M.D.   On: 03/07/2021 13:15    Procedures Procedures   Medications Ordered in ED Medications  ondansetron (ZOFRAN-ODT) disintegrating tablet 4 mg (4 mg Oral Given 03/07/21 1410)    ED Course  I have reviewed the triage vital signs and the nursing notes.  Pertinent labs & imaging  results that were available during my care of the patient were reviewed by me and considered in my medical decision making (see chart for details).    MDM Rules/Calculators/A&P                          Alert 55 year old female in no acute distress, nontoxic-appearing.  Patient presents with chief complaint of sinus pressure, rhinorrhea, and concern for opiate withdrawal.    Patient is sinus pressure and yellow/green rhinorrhea has been present for the last 3 days.  Patient also endorses fever yesterday with T-max of 100.4 orally.  On physical exam patient has tenderness to frontal and maxillary sinuses.  Patient has leukocytosis of 17.6.  Due to patient's increased white count, fever yesterday, history of COPD chronic and constant oxygen saturation we will start patient on antibiotics for acute sinusitis.  Has penicillin allergy and therefore was unable to prescribe Augmentin.  Levofloxacin considered however patient has allergy to this medication as well.  She was prescribed doxycycline.  She reports this medication causes nausea and vomiting however no rash, swelling, or other concerning signs of allergic reaction.  Patient is agreeable to this plan.  Patient reports that she ran out of her oxycodone medication last night approximately 2000.  Patient takes this medication for chronic low back pain.  Patient reports that she has been taking more this medication recently due to increased pain.  Per chart review patient had MRI of lumbar spine performed on 03/02/2021.  Per PDMP patient was prescribed quantity of 120 5 mg oxycodone by Dr. Janna Arch; this prescription was filled on 02/14/2021.  Patient denies any homicidal ideations, suicidal ideations, auditory hallucinations, visual hallucinations.  Patient given  resources for outpatient counseling and substance use.  Patient advised to follow-up with her primary care provider Dr. Janna Archondiego.  Discussed results, findings, treatment and follow up. Patient  advised of return precautions. Patient verbalized understanding and agreed with plan.   Final Clinical Impression(s) / ED Diagnoses Final diagnoses:  Acute non-recurrent sinusitis, unspecified location  Opiate withdrawal (HCC)    Rx / DC Orders ED Discharge Orders         Ordered    doxycycline (VIBRAMYCIN) 100 MG capsule  2 times daily        03/07/21 1448    ondansetron (ZOFRAN ODT) 4 MG disintegrating tablet  Every 8 hours PRN        03/07/21 1448           Berneice HeinrichBadalamente, Stoy Fenn R, PA-C 03/07/21 2306    Bethann BerkshireZammit, Joseph, MD 03/11/21 [phone]

## 2021-03-07 NOTE — Discharge Instructions (Addendum)
You came to the emergency department today to be evaluated for your sinus pain and nasal discharge.  Your physical exam and lab work I have given you a prescription for doxycycline to treat for a bacterial sinus infection.  Please take 1 pill twice a day for the next 7 days.  You also wanted to be evaluated for possible withdrawal from your opiate pain medication.  Drawl from opiates is not life-threatening.  Will explain symptoms similar to influenza.  Please ensure to stay well-hydrated.  These follow-up with your primary care provider Dr. Janna Arch.    Get help right away if: You have a severe headache. You have persistent vomiting. You have severe pain or swelling around your face or eyes. You have vision problems. You develop confusion. Your neck is stiff. You have trouble breathing.You have a seizure. You lose consciousness. You cannot stop vomiting. You are unable to drink or eat, and you become weak and dizzy. You develop chest pain, shortness of breath, or fever. You have serious thoughts about hurting yourself or others.

## 2021-03-07 NOTE — ED Triage Notes (Signed)
Pt c/o withdrawal symptoms due to running out of her prescription pain medication.  The pt states she normally takes 4 5 mg tablets a day, but has been using more due to increased back pain.

## 2021-03-07 NOTE — ED Notes (Addendum)
NT reports pt was out of the room when performing hourly rounding, pt up for discharge and did not stay for her AVS, this Rn called the pt to let her know that she has abx and zofran that can be picked up at [facility] in [place] pt reported she was in the lobby and refused to come back in for vital recheck and to sign, pt verbalizes understanding of discharge instructions and medication regimen

## 2021-04-25 ENCOUNTER — Other Ambulatory Visit: Payer: Self-pay | Admitting: Internal Medicine

## 2021-05-27 IMAGING — CT CT ANGIO CHEST
2 of 6 series · 17 of 46 positions shown · IV contrast (Omnipaque or Isovue)
Comparison: 11/27/2019 CTs and prior studies

CLINICAL DATA: 53-year-old female with acute chest, abdominal flank
pain for several weeks. Acute shortness of breath and wheezing
today.

EXAM:
CT ANGIOGRAPHY CHEST
CT ABDOMEN AND PELVIS WITH CONTRAST
TECHNIQUE: Multidetector CT imaging of the chest was performed using the
standard protocol during bolus administration of intravenous
contrast. Multiplanar CT image reconstructions and MIPs were
obtained to evaluate the vascular anatomy. Multidetector CT imaging
of the abdomen and pelvis was performed using the standard protocol
during bolus administration of intravenous contrast.
CONTRAST:  100mL OMNIPAQUE IOHEXOL 350 MG/ML SOLN

[Series 5: pe axialthins · axial · 0.80mm/px · z∈[+1319,+1570]mm · 14 of 275 slices shown]
[im 12/275  lung]
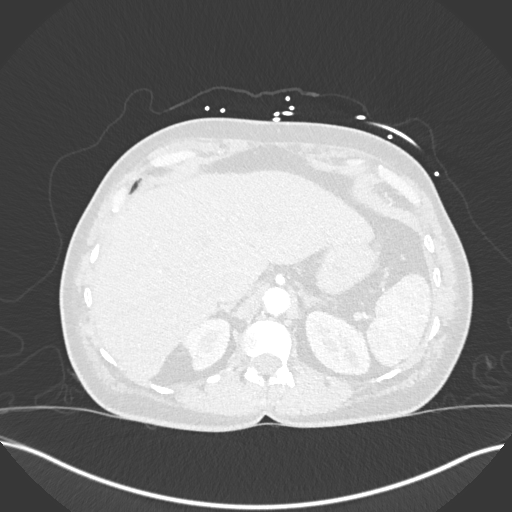
[im 36/275  soft-tissue]
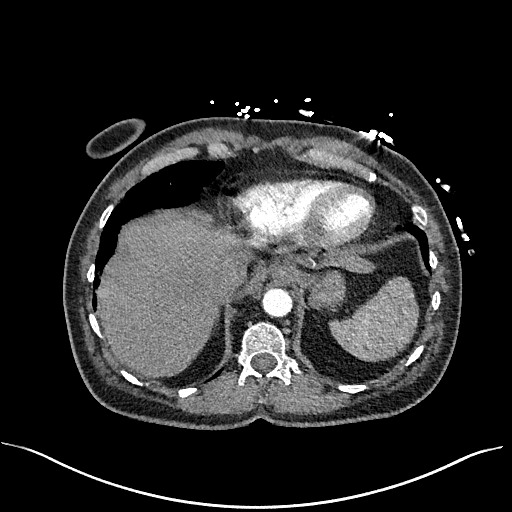
[im 48/275  lung]
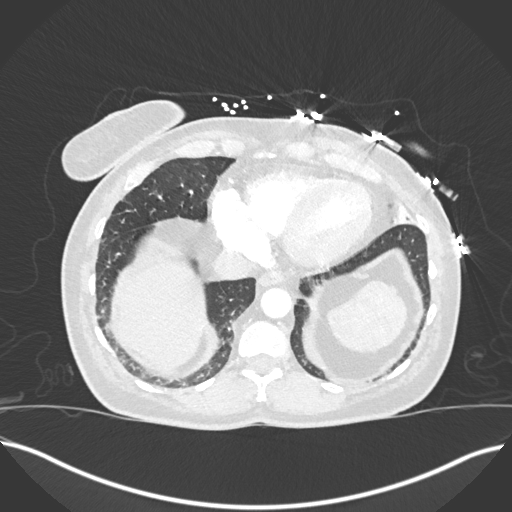
[im 72/275  soft-tissue]
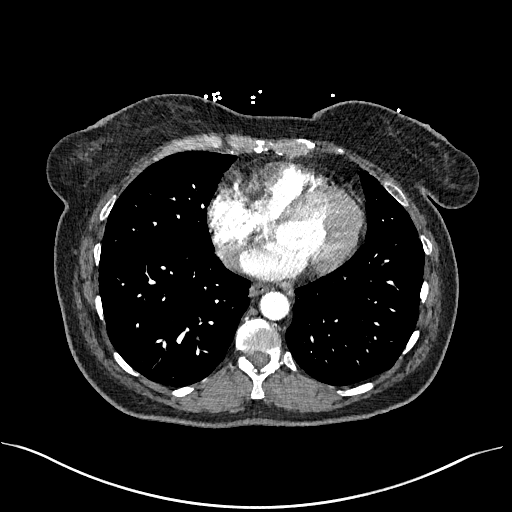
[im 96/275  lung]
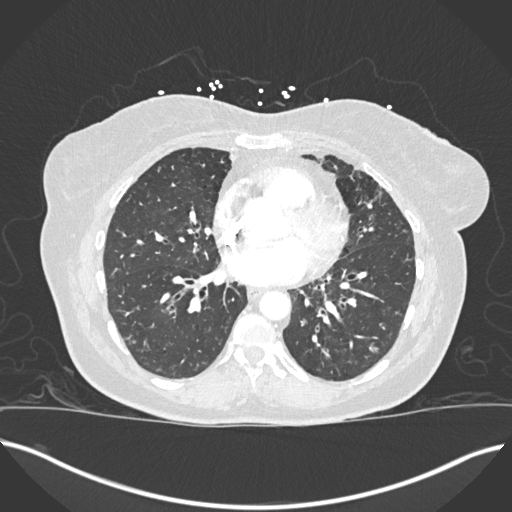
[im 108/275  soft-tissue]
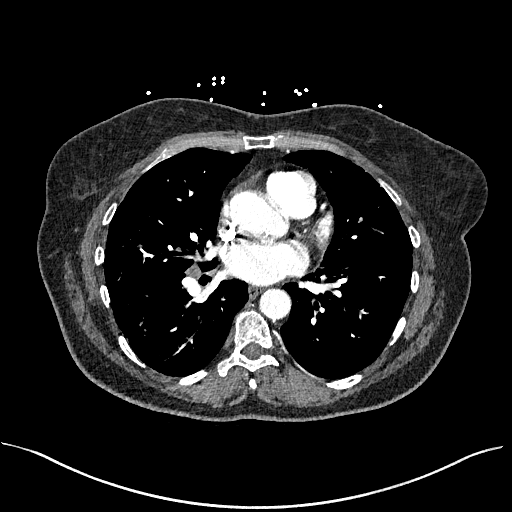
[im 132/275  lung]
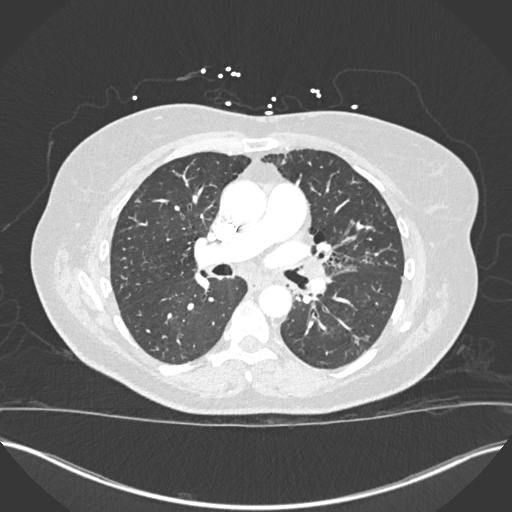
[im 143/275  soft-tissue]
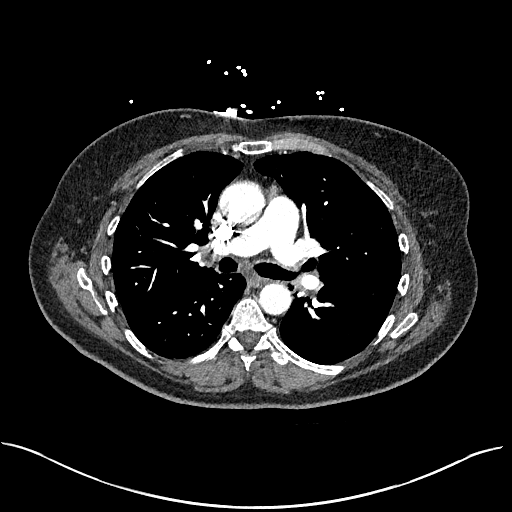
[im 167/275  lung]
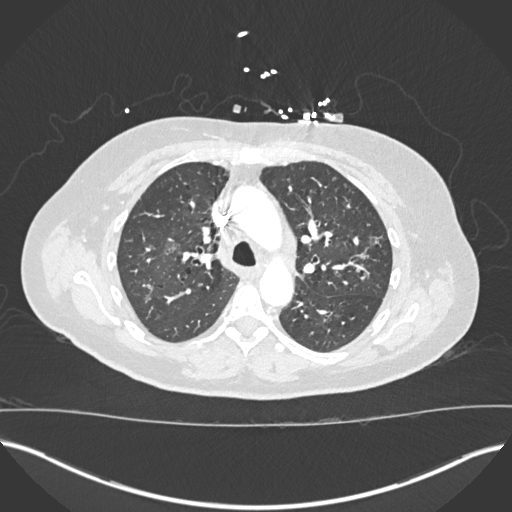
[im 179/275  soft-tissue]
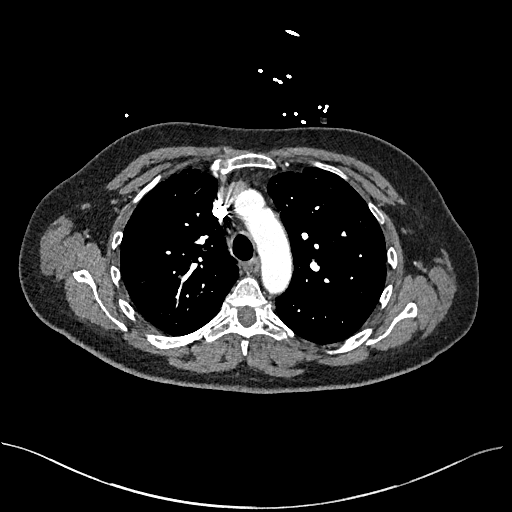
[im 203/275  lung]
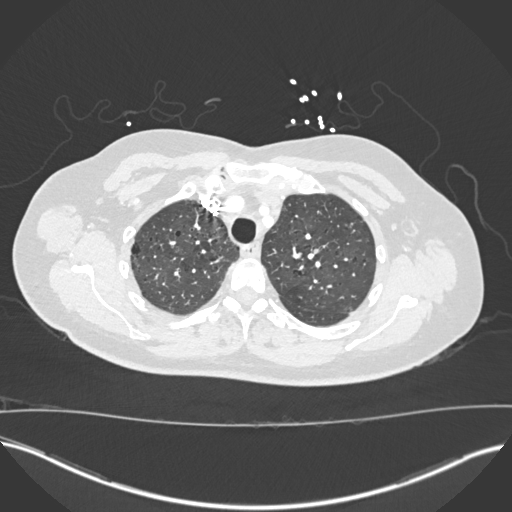
[im 227/275  soft-tissue]
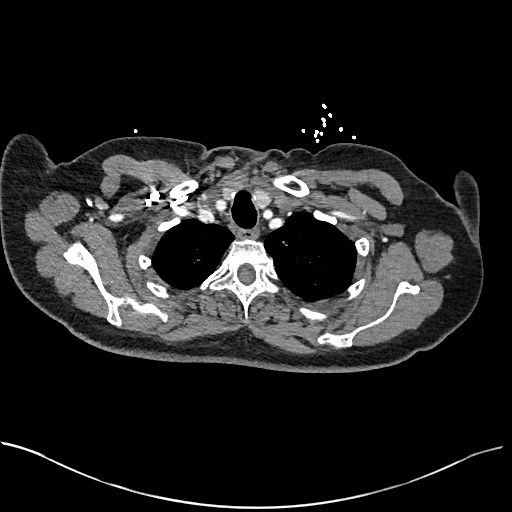
[im 239/275  lung]
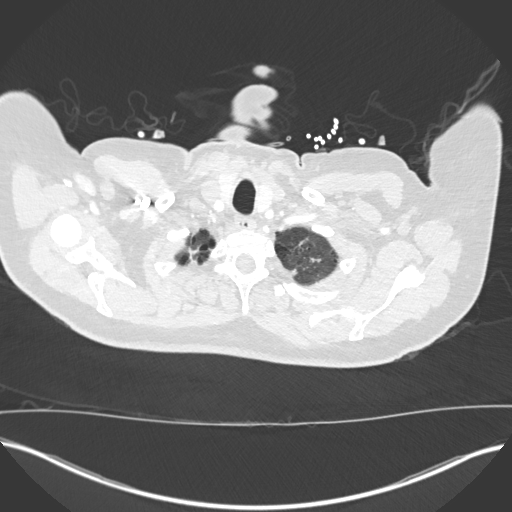
[im 263/275  soft-tissue]
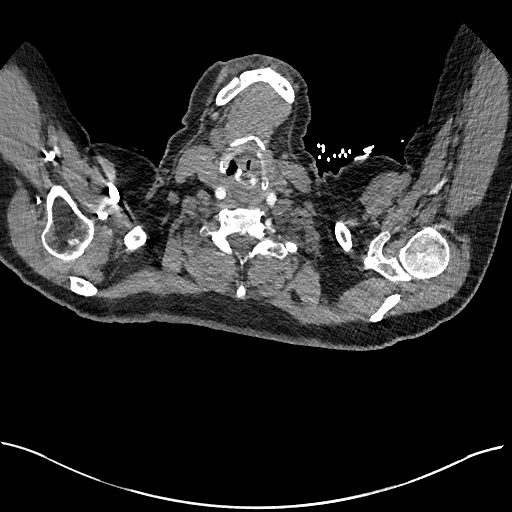

[Series 7: cor soft · coronal · 0.54mm/px · 3 of 136 slices shown]
[im 34/136  soft-tissue]
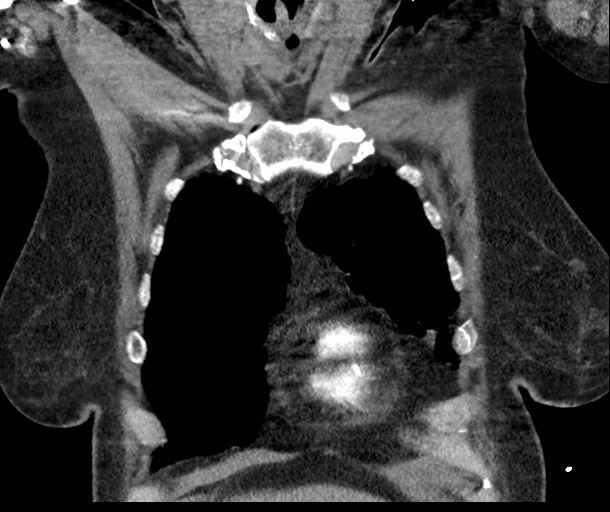
[im 68/136  soft-tissue]
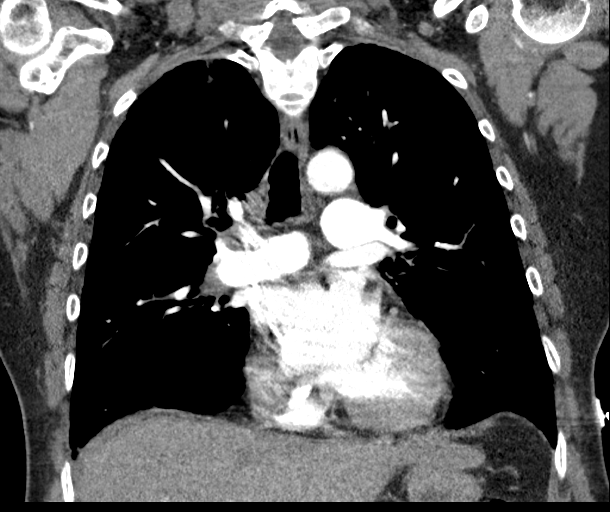
[im 102/136  soft-tissue]
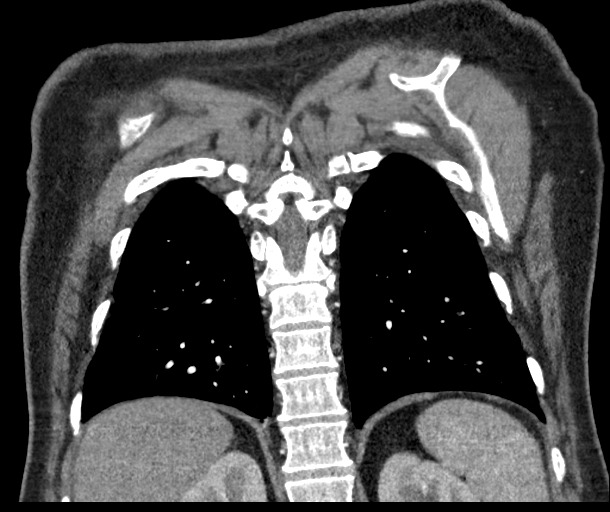

[17 of 46 positions shown; findings below may reference images not displayed]

FINDINGS: CTA CHEST FINDINGS

Cardiovascular: This is a technically satisfactory study. No
pulmonary emboli are identified. Heart size is normal. No thoracic
aortic aneurysm or pericardial effusion.

Mediastinum/Nodes: Mildly enlarged bilateral mediastinal and hilar
lymph nodes are again identified and not significantly changed from
0704. No increasing lymphadenopathy is identified. No mediastinal
mass is noted. The visualized trachea, esophagus and thyroid are
unremarkable.

Lungs/Pleura: Previously noted diffuse tree in bud and nodular
opacities throughout both lungs have nearly completely resolved.
Mild paraseptal and centrilobular emphysema is noted. No airspace
disease, mass, pleural effusion or pneumothorax identified.

Musculoskeletal: No acute or suspicious bony abnormalities noted.

Review of the MIP images confirms the above findings.

CT ABDOMEN and PELVIS FINDINGS

Hepatobiliary: Gallbladder distension is again noted. Mild CBD
dilatation to the ampulla and mild intrahepatic biliary dilatation
are unchanged from 0704. No other hepatic abnormalities are noted.

Pancreas: Unremarkable

Spleen: Unremarkable

Adrenals/Urinary Tract: The kidneys, adrenal glands and bladder are
unremarkable except for small LEFT renal cysts.

Stomach/Bowel: Stomach is within normal limits. Appendix appears
normal. No evidence of bowel wall thickening, distention, or
inflammatory changes.

Vascular/Lymphatic: No significant vascular findings are present. No
enlarged abdominal or pelvic lymph nodes.

Reproductive: Uterus and bilateral adnexa are unremarkable.

Other: No ascites, focal collection or pneumoperitoneum.

Musculoskeletal: No acute or suspicious bony abnormalities
identified.

Review of the MIP images confirms the above findings.
IMPRESSION: 1. No evidence of acute abnormality. No pulmonary emboli or thoracic
aortic aneurysm.
2. Near complete resolution of tree-in-bud/nodular opacities within
both lungs since 11/27/2019.
[DATE]. Unchanged mildly enlarged bilateral mediastinal and hilar lymph
nodes - likely reactive and could be a manifestation of sarcoid.
4. Unchanged gallbladder distension, mild CBD dilatation to the
ampulla and mild intrahepatic biliary dilatation since [DATE].  Emphysema ([id number].T).

## 2021-05-27 IMAGING — CT CT ABD-PELV W/ CM
2 of 5 series · 15 of 46 positions shown, 17 images · IV contrast (Omnipaque or Isovue)
Comparison: 11/27/2019 CTs and prior studies

CLINICAL DATA: 53-year-old female with acute chest, abdominal flank
pain for several weeks. Acute shortness of breath and wheezing
today.

EXAM:
CT ANGIOGRAPHY CHEST
CT ABDOMEN AND PELVIS WITH CONTRAST
TECHNIQUE: Multidetector CT imaging of the chest was performed using the
standard protocol during bolus administration of intravenous
contrast. Multiplanar CT image reconstructions and MIPs were
obtained to evaluate the vascular anatomy. Multidetector CT imaging
of the abdomen and pelvis was performed using the standard protocol
during bolus administration of intravenous contrast.
CONTRAST:  100mL OMNIPAQUE IOHEXOL 350 MG/ML SOLN

[Series 12: abd/pel w/ · axial · 0.81mm/px · z∈[+1006,+1351]mm · 12 of 81 slices shown, 14 images]
[im 6/81  soft-tissue]
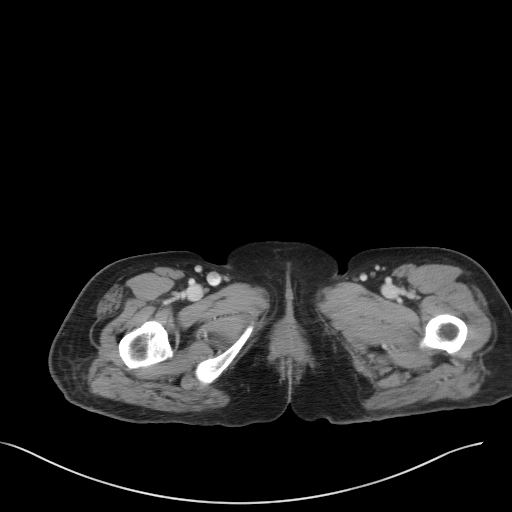
[im 6/81  bone]
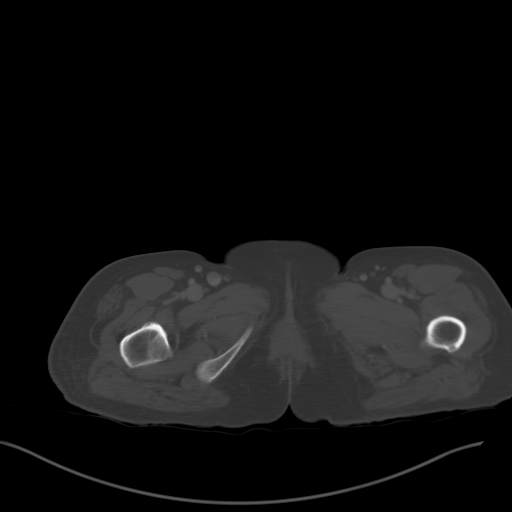
[im 12/81  soft-tissue]
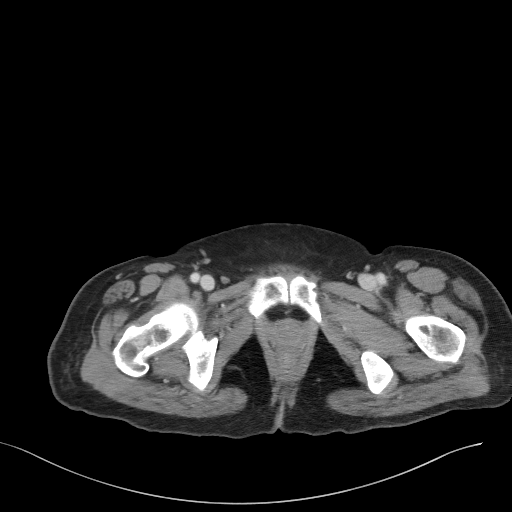
[im 18/81  soft-tissue]
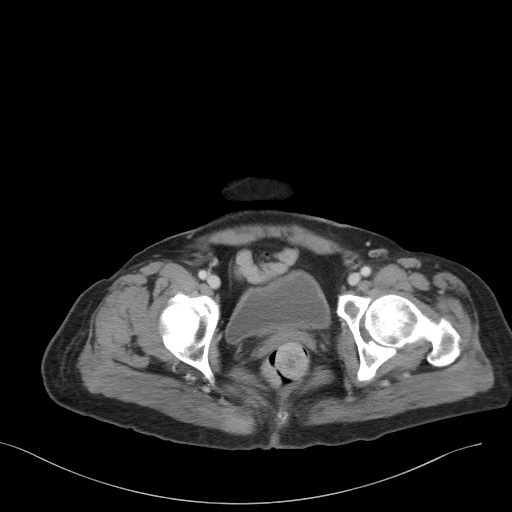
[im 23/81  soft-tissue]
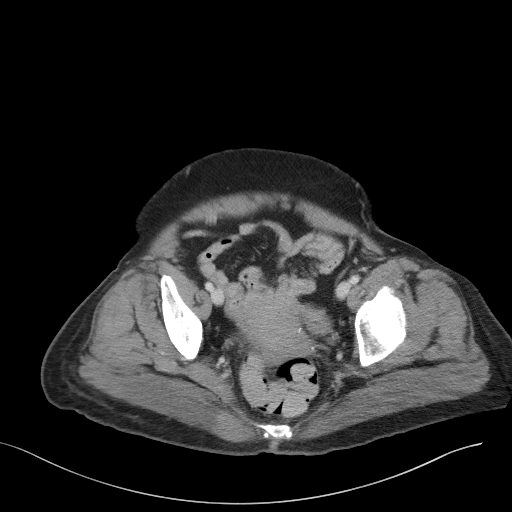
[im 29/81  soft-tissue]
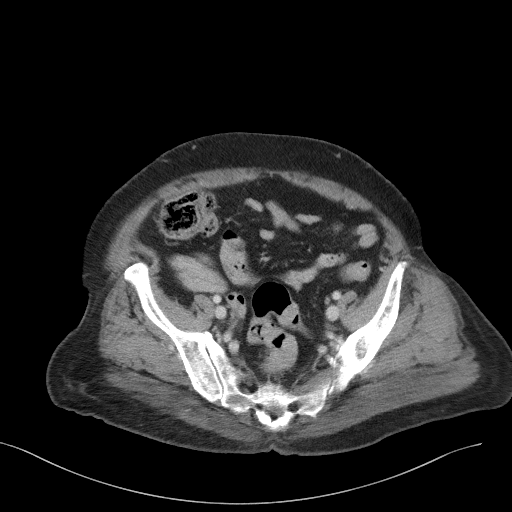
[im 35/81  soft-tissue]
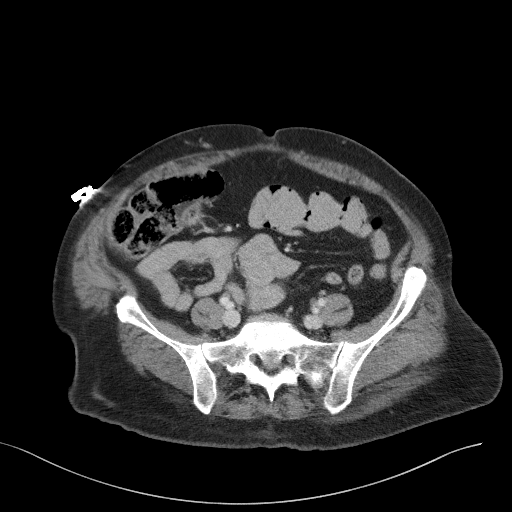
[im 46/81  soft-tissue]
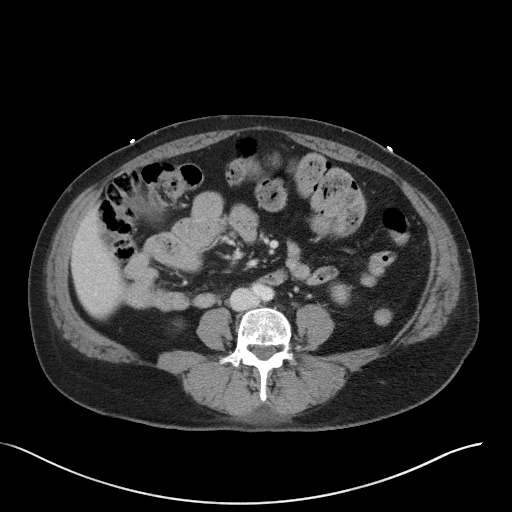
[im 52/81  soft-tissue]
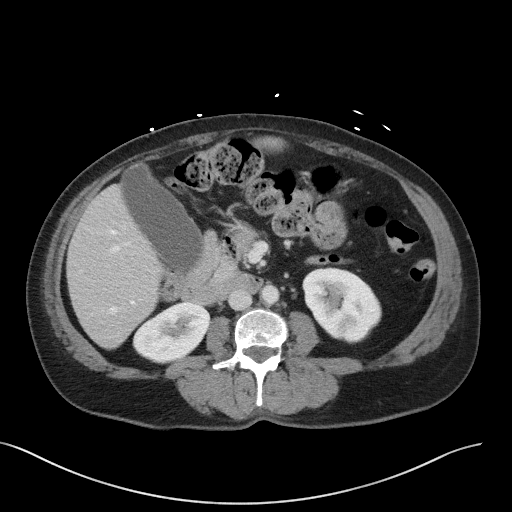
[im 58/81  soft-tissue]
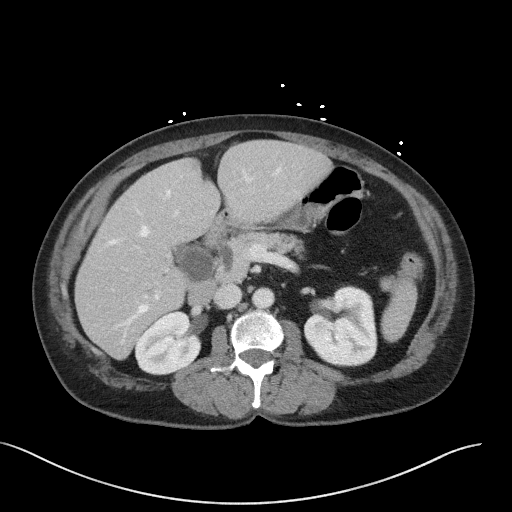
[im 58/81  bone]
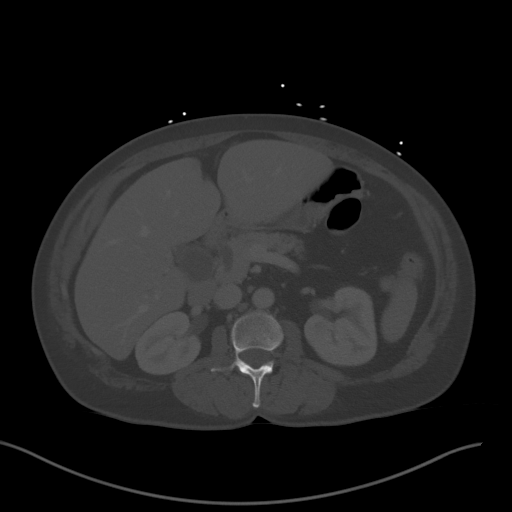
[im 63/81  soft-tissue]
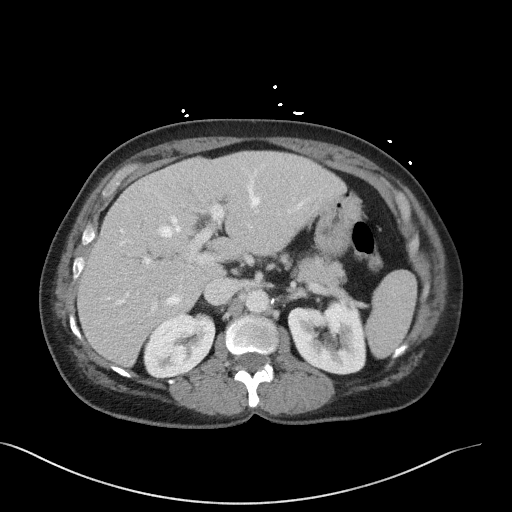
[im 69/81  soft-tissue]
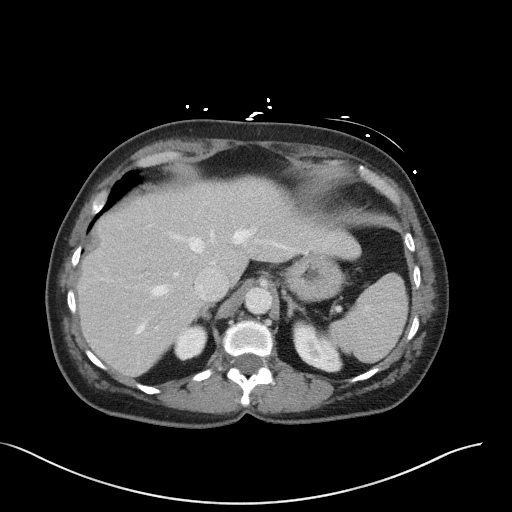
[im 75/81  soft-tissue]
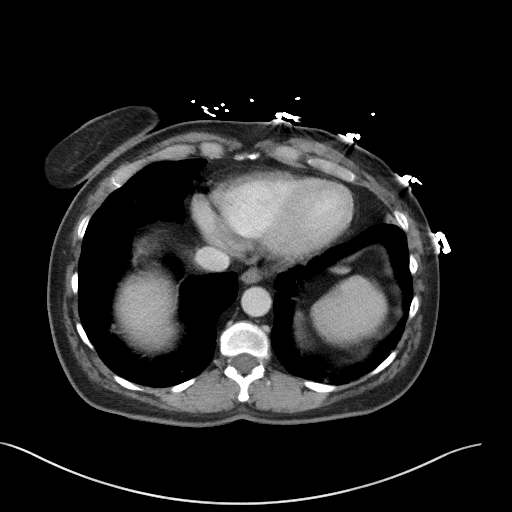

[Series 14: coronal · coronal · 0.80mm/px · 3 of 100 slices shown]
[im 34/100  soft-tissue]
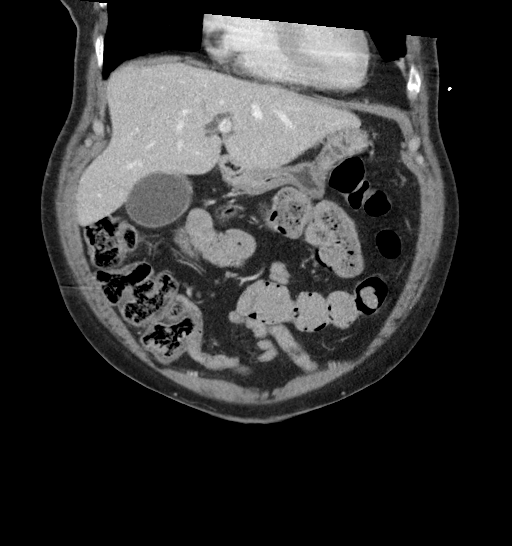
[im 45/100  soft-tissue]
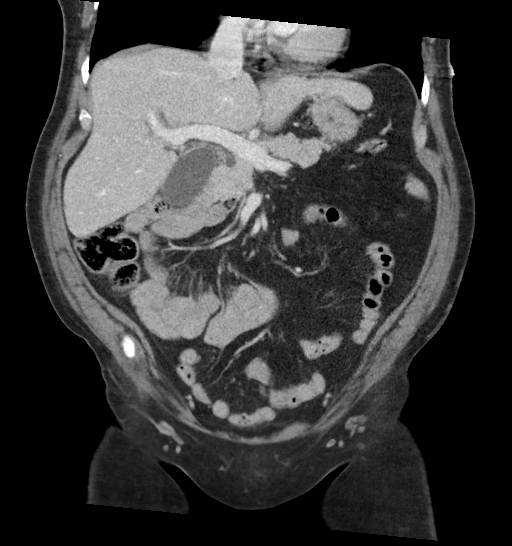
[im 56/100  soft-tissue]
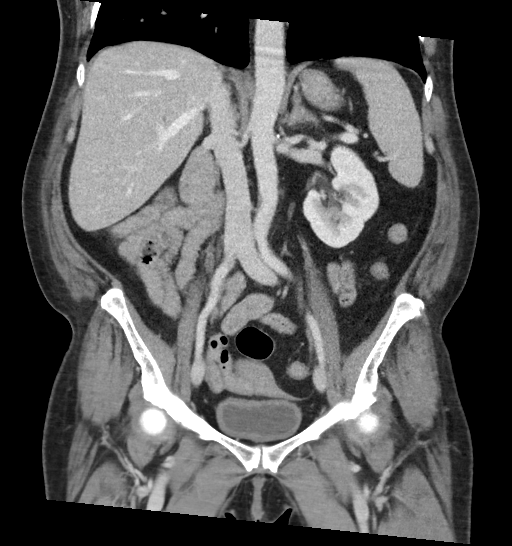

[15 of 46 positions shown; findings below may reference images not displayed]

FINDINGS: CTA CHEST FINDINGS

Cardiovascular: This is a technically satisfactory study. No
pulmonary emboli are identified. Heart size is normal. No thoracic
aortic aneurysm or pericardial effusion.

Mediastinum/Nodes: Mildly enlarged bilateral mediastinal and hilar
lymph nodes are again identified and not significantly changed from
0704. No increasing lymphadenopathy is identified. No mediastinal
mass is noted. The visualized trachea, esophagus and thyroid are
unremarkable.

Lungs/Pleura: Previously noted diffuse tree in bud and nodular
opacities throughout both lungs have nearly completely resolved.
Mild paraseptal and centrilobular emphysema is noted. No airspace
disease, mass, pleural effusion or pneumothorax identified.

Musculoskeletal: No acute or suspicious bony abnormalities noted.

Review of the MIP images confirms the above findings.

CT ABDOMEN and PELVIS FINDINGS

Hepatobiliary: Gallbladder distension is again noted. Mild CBD
dilatation to the ampulla and mild intrahepatic biliary dilatation
are unchanged from 0704. No other hepatic abnormalities are noted.

Pancreas: Unremarkable

Spleen: Unremarkable

Adrenals/Urinary Tract: The kidneys, adrenal glands and bladder are
unremarkable except for small LEFT renal cysts.

Stomach/Bowel: Stomach is within normal limits. Appendix appears
normal. No evidence of bowel wall thickening, distention, or
inflammatory changes.

Vascular/Lymphatic: No significant vascular findings are present. No
enlarged abdominal or pelvic lymph nodes.

Reproductive: Uterus and bilateral adnexa are unremarkable.

Other: No ascites, focal collection or pneumoperitoneum.

Musculoskeletal: No acute or suspicious bony abnormalities
identified.

Review of the MIP images confirms the above findings.
IMPRESSION: 1. No evidence of acute abnormality. No pulmonary emboli or thoracic
aortic aneurysm.
2. Near complete resolution of tree-in-bud/nodular opacities within
both lungs since 11/27/2019.
[DATE]. Unchanged mildly enlarged bilateral mediastinal and hilar lymph
nodes - likely reactive and could be a manifestation of sarcoid.
4. Unchanged gallbladder distension, mild CBD dilatation to the
ampulla and mild intrahepatic biliary dilatation since [DATE].  Emphysema ([id number]).

## 2021-05-27 IMAGING — CT CT HEAD W/O CM
3 series · 16 of 47 positions shown, 19 images · non-contrast
Comparison: Head CT 06/06/2015

CLINICAL DATA: Headache, acute, normal neuro exam

EXAM:
CT HEAD WITHOUT CONTRAST
TECHNIQUE: Contiguous axial images were obtained from the base of the skull
through the vertex without intravenous contrast.

[Series 2: head w o · axial · 0.50mm/px · z∈[+53,+188]mm · 10 of 33 slices shown, 13 images]
[im 3/33  brain]
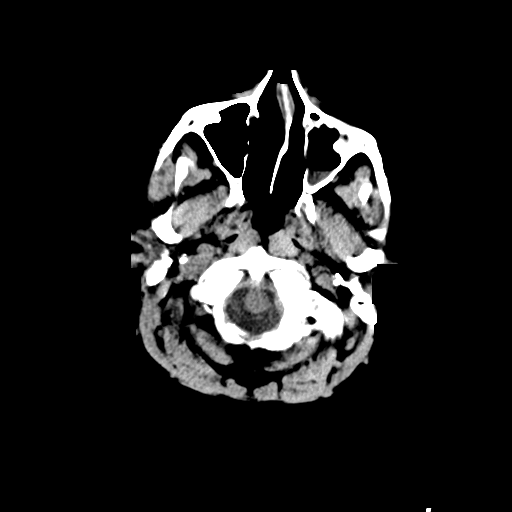
[im 3/33  bone]
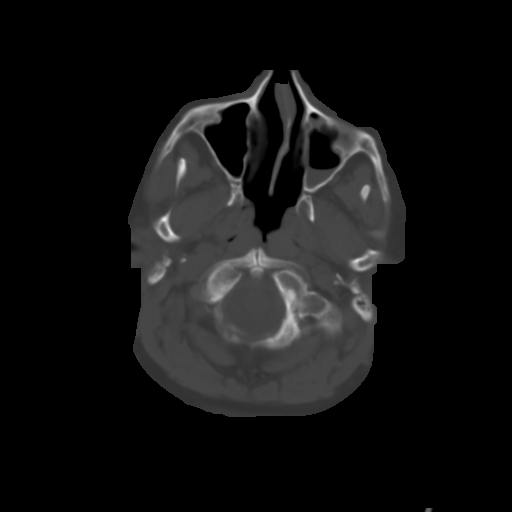
[im 6/33  brain]
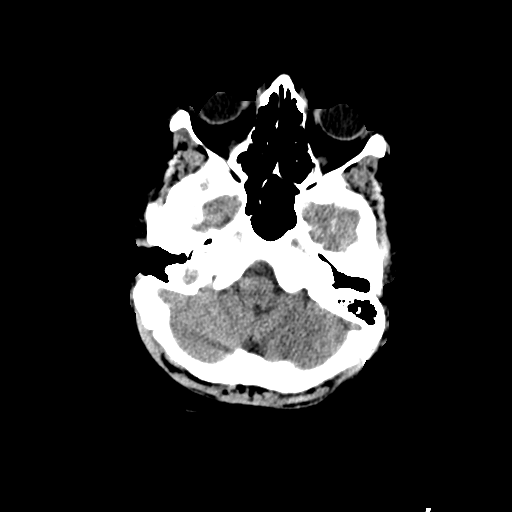
[im 9/33  brain]
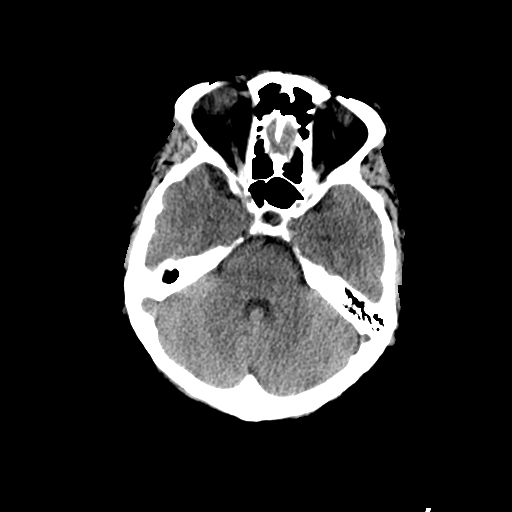
[im 12/33  brain]
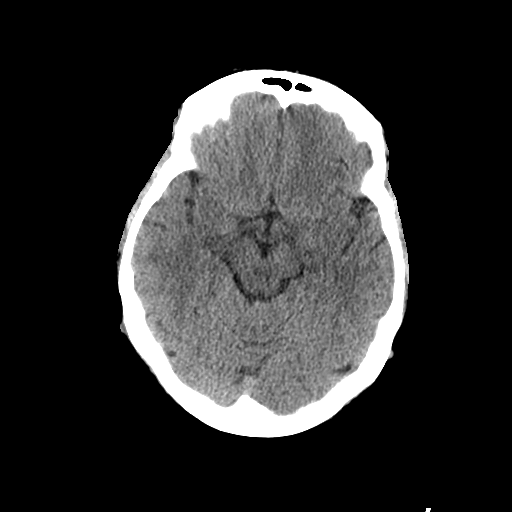
[im 15/33  brain]
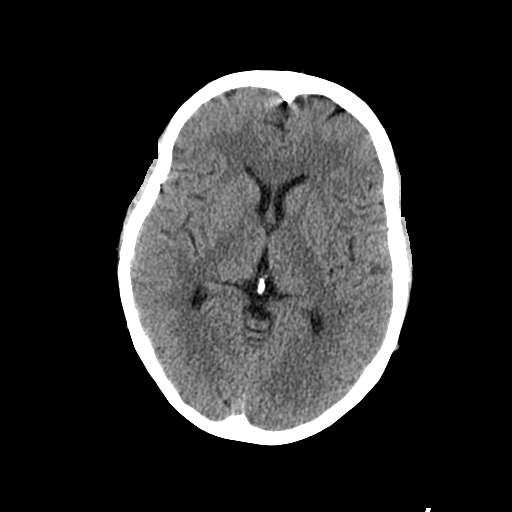
[im 15/33  bone]
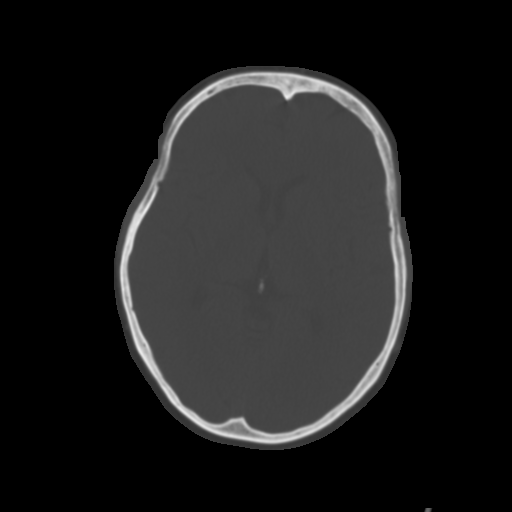
[im 18/33  brain]
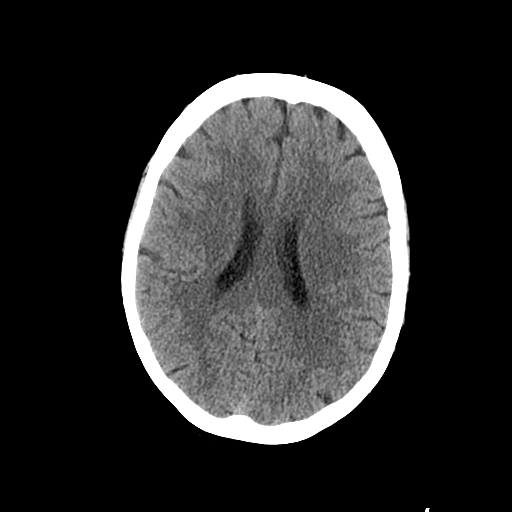
[im 21/33  brain]
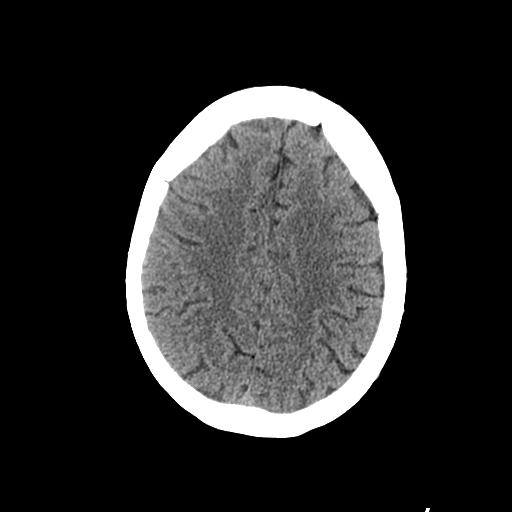
[im 25/33  brain]
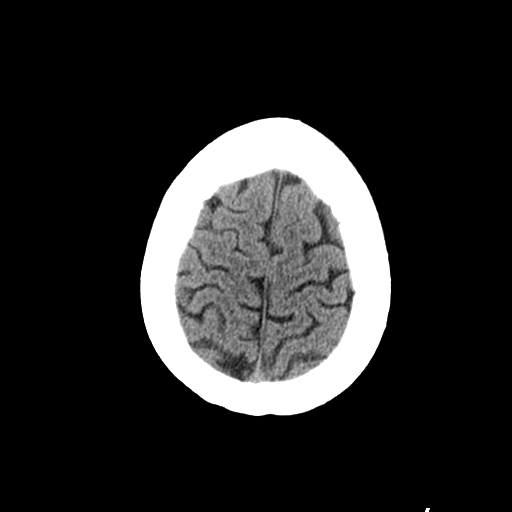
[im 27/33  brain]
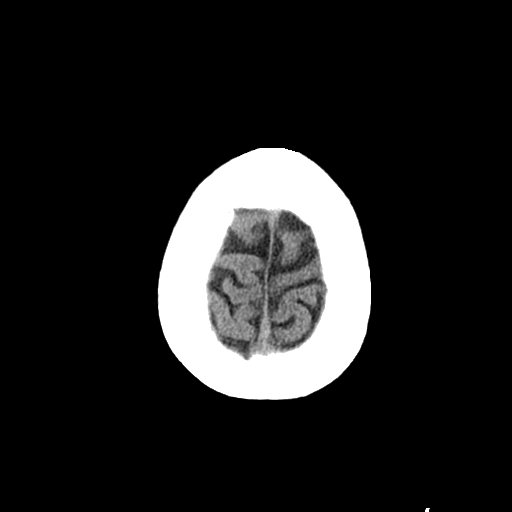
[im 27/33  bone]
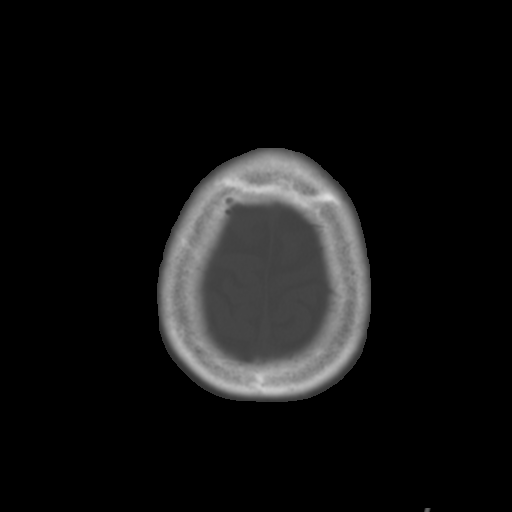
[im 30/33  brain]
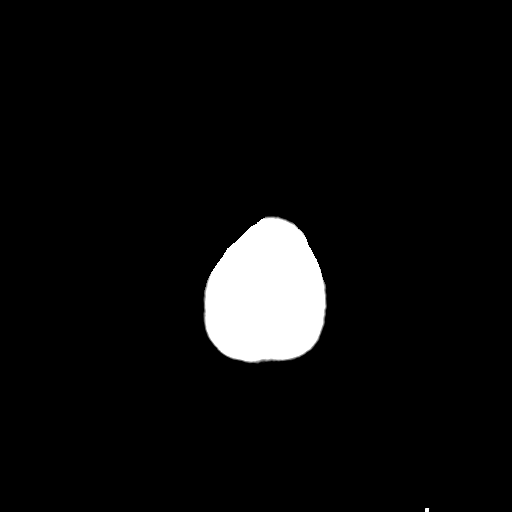

[Series 4: coronal soft · coronal · 0.35mm/px · 3 of 67 slices shown]
[im 23/67  brain]
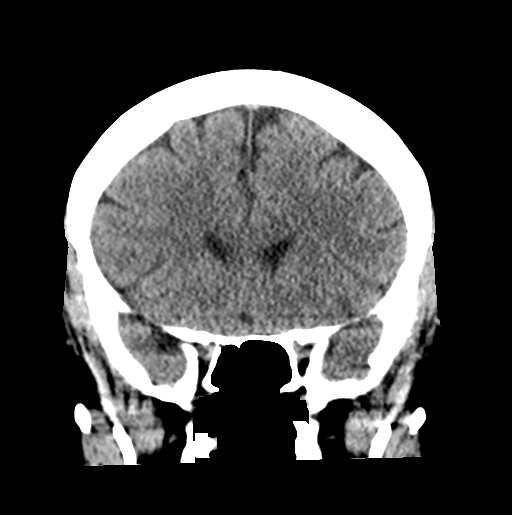
[im 30/67  brain]
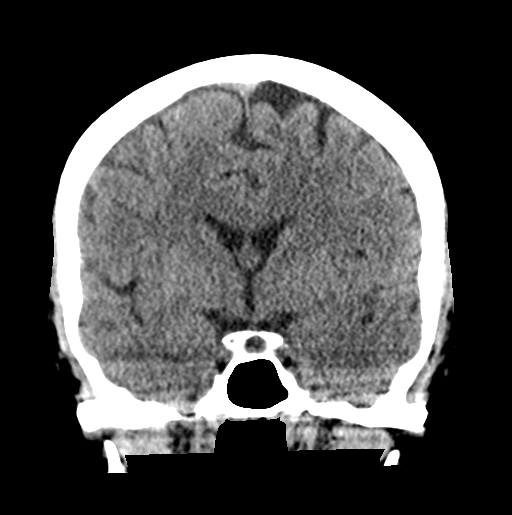
[im 37/67  brain]
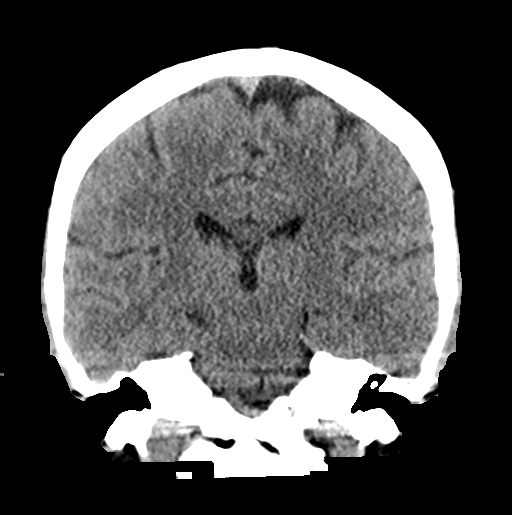

[Series 5: sagittal soft · sagittal · 0.35mm/px · 3 of 56 slices shown]
[im 19/56  brain]
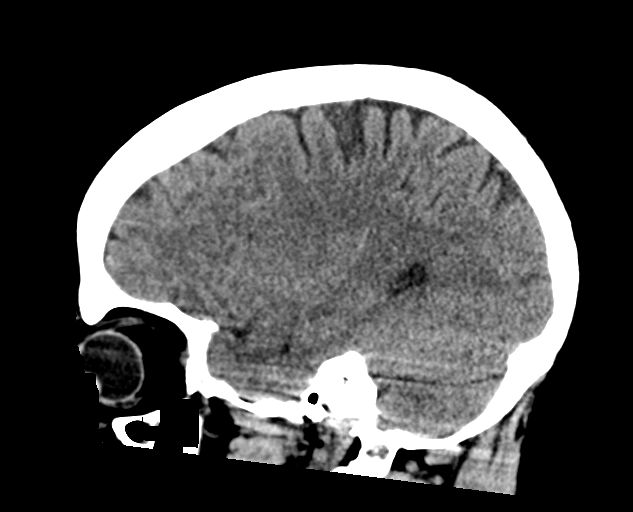
[im 28/56  brain]
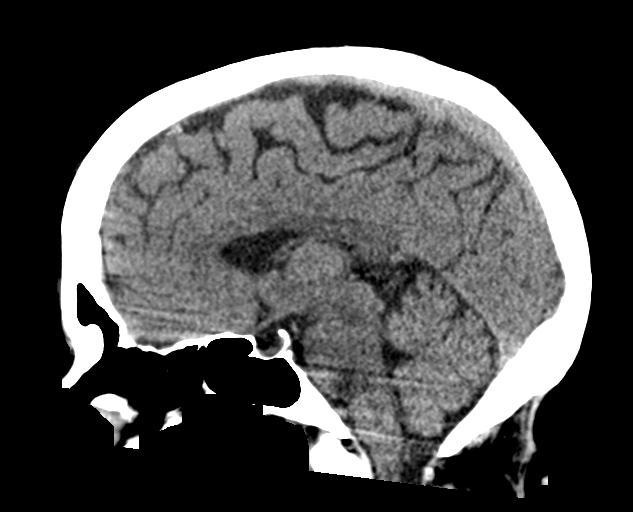
[im 37/56  brain]
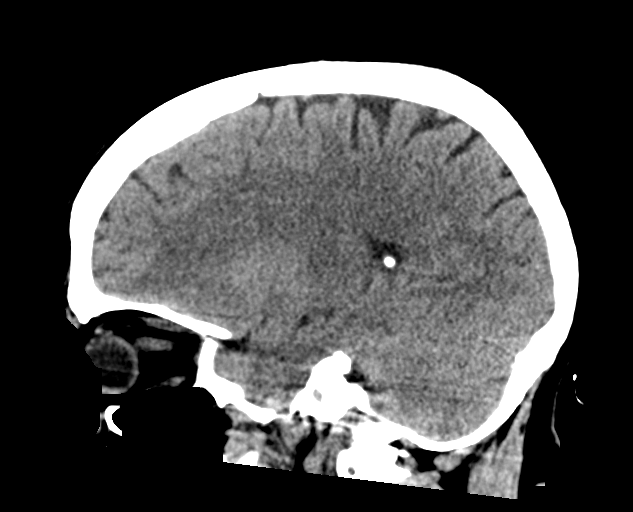

[16 of 47 positions shown; findings below may reference images not displayed]

FINDINGS: Brain: No intracranial hemorrhage, mass effect, or midline shift. No
hydrocephalus. The basilar cisterns are patent. No evidence of
territorial infarct or acute ischemia. No extra-axial or
intracranial fluid collection.

Vascular: No hyperdense vessel or unexpected calcification.

Skull: No fracture or focal lesion.

Sinuses/Orbits: Prior right mastoidectomy. Left mastoid air cells
are clear. Unchanged fluid level in the left maxillary sinus from
prior exam. Scattered mucosal thickening of ethmoid air cells. No
evidence of acute sinus disease. Visualized orbits are unremarkable.

Other: None.
IMPRESSION: 1. No acute intracranial abnormality.
2. Chronic fluid level in the left maxillary sinus, unchanged from

## 2021-05-27 IMAGING — DX DG CHEST 2V
2 series · 2 of 2 positions shown · non-contrast
Comparison: May 26, 2020

CLINICAL DATA: Shortness of breath

EXAM:
CHEST - 2 VIEW

[chest pa]
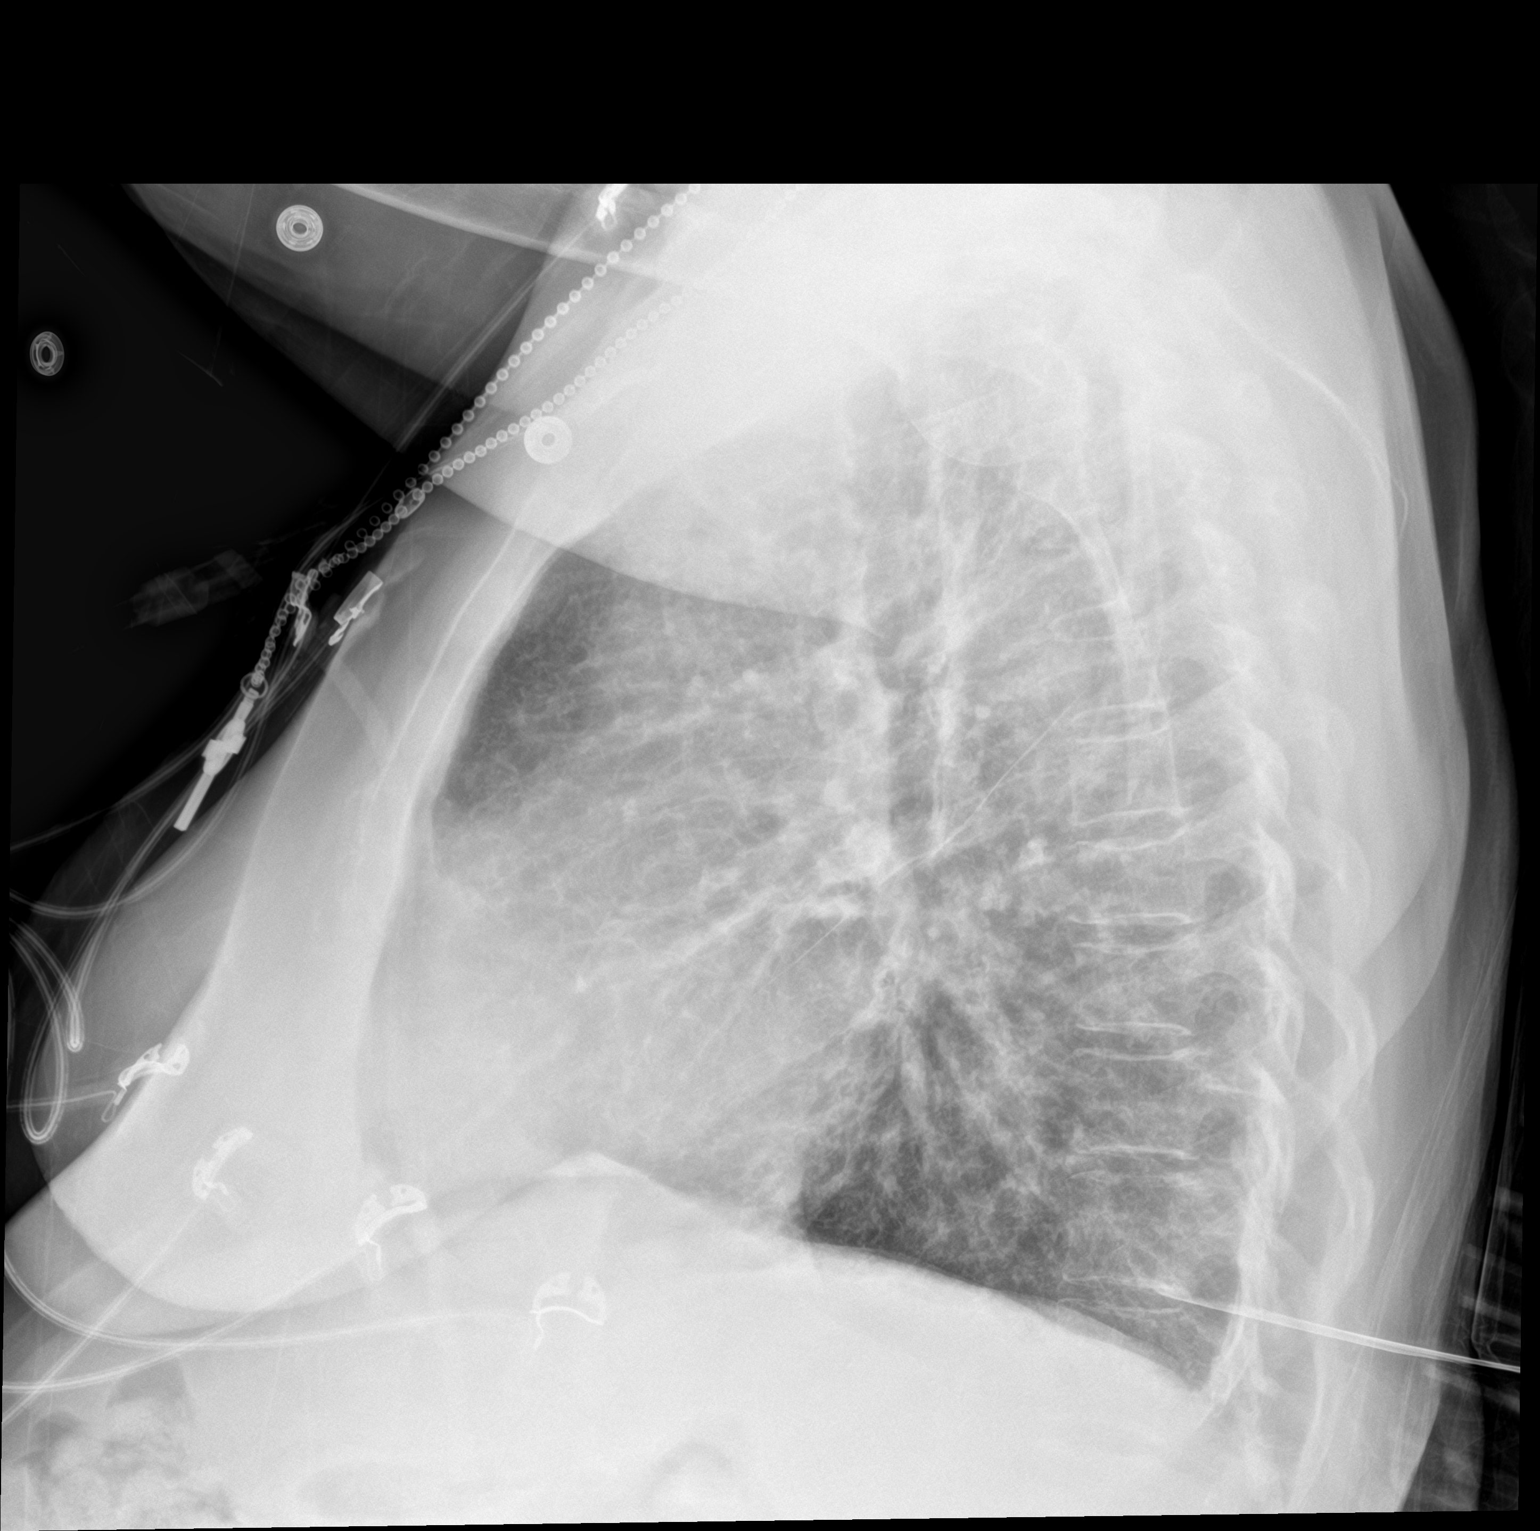

[chest ap]
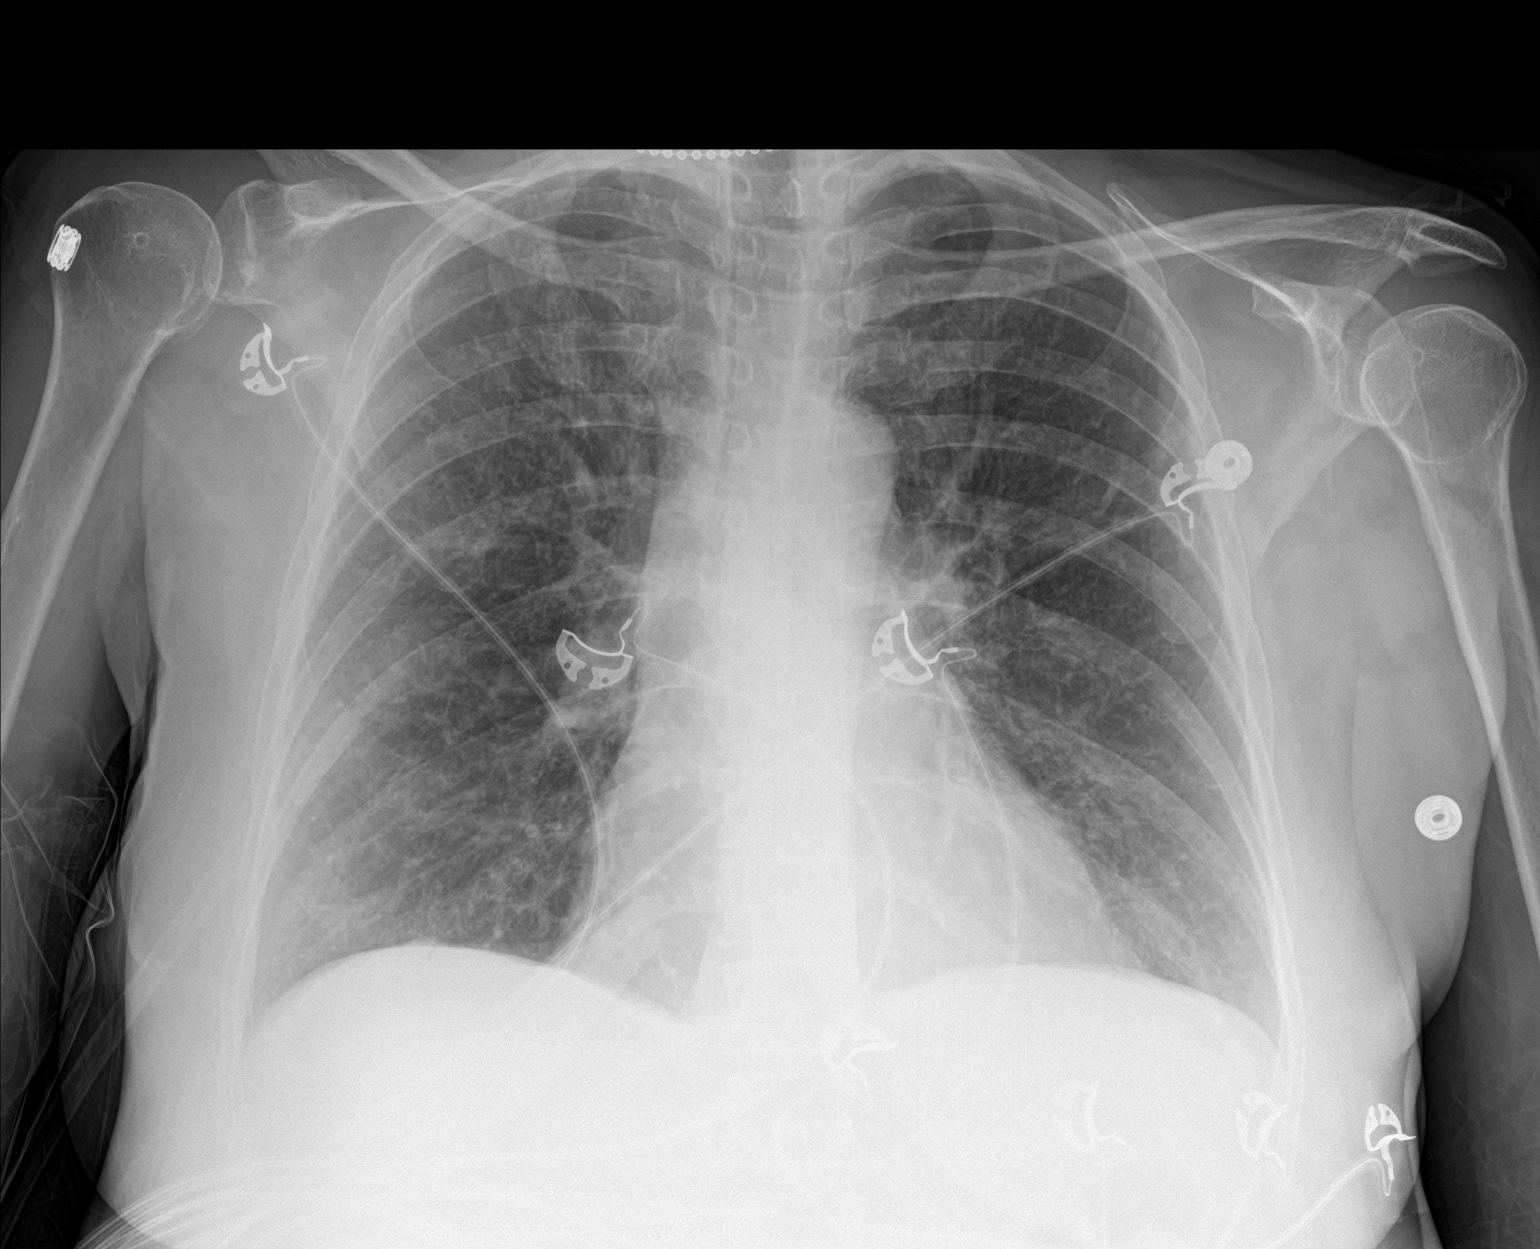

[2 of 2 positions shown; findings below may reference images not displayed]

FINDINGS: There is interstitial thickening throughout the lungs. There is no
edema or airspace opacity. Heart size and pulmonary vascularity are
normal. No adenopathy. No bone lesions.
IMPRESSION: Interstitial thickening in the lungs, likely reflecting underlying
chronic inflammatory type change. No frank edema or airspace
opacity. Cardiac silhouette within normal limits. No adenopathy.

## 2021-05-29 ENCOUNTER — Other Ambulatory Visit: Payer: Self-pay | Admitting: Internal Medicine

## 2021-07-17 ENCOUNTER — Encounter (HOSPITAL_COMMUNITY): Payer: Self-pay | Admitting: *Deleted

## 2021-07-17 ENCOUNTER — Other Ambulatory Visit: Payer: Self-pay

## 2021-07-17 ENCOUNTER — Emergency Department (HOSPITAL_COMMUNITY)
Admission: EM | Admit: 2021-07-17 | Discharge: 2021-07-17 | Disposition: A | Discharge status: Home / Self Care | Payer: Medicaid Other | Attending: Emergency Medicine | Admitting: Emergency Medicine

## 2021-07-17 DIAGNOSIS — J45909 Unspecified asthma, uncomplicated: Secondary | ICD-10-CM | POA: Diagnosis not present

## 2021-07-17 DIAGNOSIS — E119 Type 2 diabetes mellitus without complications: Secondary | ICD-10-CM | POA: Insufficient documentation

## 2021-07-17 DIAGNOSIS — Z760 Encounter for issue of repeat prescription: Secondary | ICD-10-CM | POA: Diagnosis present

## 2021-07-17 DIAGNOSIS — J441 Chronic obstructive pulmonary disease with (acute) exacerbation: Secondary | ICD-10-CM | POA: Insufficient documentation

## 2021-07-17 DIAGNOSIS — Z7984 Long term (current) use of oral hypoglycemic drugs: Secondary | ICD-10-CM | POA: Insufficient documentation

## 2021-07-17 DIAGNOSIS — Z794 Long term (current) use of insulin: Secondary | ICD-10-CM | POA: Diagnosis not present

## 2021-07-17 DIAGNOSIS — I10 Essential (primary) hypertension: Secondary | ICD-10-CM | POA: Insufficient documentation

## 2021-07-17 DIAGNOSIS — F17210 Nicotine dependence, cigarettes, uncomplicated: Secondary | ICD-10-CM | POA: Insufficient documentation

## 2021-07-17 DIAGNOSIS — Z7951 Long term (current) use of inhaled steroids: Secondary | ICD-10-CM | POA: Insufficient documentation

## 2021-07-17 MED ORDER — OXYCODONE HCL 5 MG PO TABS
5.0000 mg | ORAL_TABLET | Freq: Four times a day (QID) | ORAL | 0 refills | Status: AC | PRN
Start: 2021-07-17 — End: 2021-07-24

## 2021-07-17 MED ORDER — ALBUTEROL SULFATE HFA 108 (90 BASE) MCG/ACT IN AERS
2.0000 | INHALATION_SPRAY | Freq: Once | RESPIRATORY_TRACT | Status: AC
  Administered 2021-07-17: 2 via RESPIRATORY_TRACT
  Filled 2021-07-17: qty 6.7

## 2021-07-17 MED ORDER — OXYCODONE HCL 5 MG PO TABS
10.0000 mg | ORAL_TABLET | Freq: Once | ORAL | Status: AC
  Administered 2021-07-17: 10 mg via ORAL
  Filled 2021-07-17: qty 2

## 2021-07-17 MED ORDER — MELOXICAM 15 MG PO TABS: 15.0000 mg | ORAL_TABLET | Freq: Every day | ORAL | 0 refills | Status: DC

## 2021-07-17 NOTE — ED Triage Notes (Signed)
Pt states her PCP (don Diego) has retired and pt needs her medication refilled for her inhaler and pain medication.

## 2021-07-17 NOTE — ED Triage Notes (Signed)
Last took her pain medication two days ago.  Appt made in September.

## 2021-07-17 NOTE — ED Provider Notes (Addendum)
[facility] EMERGENCY DEPARTMENT Provider Note   CSN: [id number] Arrival date & time: 07/17/21  1949     History Chief Complaint  Patient presents with   Medication Refill    [name] is a 55 y.o. female.  HPI   Pt is a 55 y/o female with a history of angina, anxiety state, arts, asthma, chronic back pain, COPD, diabetes, GERD, hypertension, hyperglycemia, migraines, on home O2, pneumonia, recurrent upper respiratory infection, presents the emergency department today requesting a medication refill.  She states that her PCP is not currently working anymore and she needs some refills for her medications that she does not have any refills for.  She is requesting a refill of her oxycodone, meloxicam and albuterol.  She is currently working on getting an appoint with a PCP and has an appointment on September 20 but she has already been out of her medications for 2 days.   Past Medical History:  Diagnosis Date   Angina    Anxiety state 10/15/2015   ARDS (adult respiratory distress syndrome) (HCC)    Jan 2011   Asthma    Chronic back pain    COPD (chronic obstructive pulmonary disease) (HCC)    Diabetes mellitus    GERD (gastroesophageal reflux disease) 12/21/2012   HTN (hypertension) 10/15/2015   Hyperglycemia, drug-induced    steroid induced hyperglycemia   Migraine headache    On home O2    Pneumonia    Recurrent upper respiratory infection (URI)    Shortness of breath     Patient Active Problem List   Diagnosis Date Noted   Chronic respiratory failure with hypoxia and hypercapnia (HCC) 04/23/2020   Hoarseness or changing voice 01/11/2020   Nausea and vomiting 01/11/2020   Acute on chronic respiratory failure with hypoxia and hypercapnia (HCC) 11/27/2019   Chronic pain syndrome 11/27/2019   Lobar pneumonia (HCC) 11/27/2019   Atypical pneumonia 09/28/2016   Chronic obstructive pulmonary disease (HCC)    Pneumonia 05/30/2016   SIRS (systemic inflammatory response  syndrome) (HCC) 10/16/2015   Sepsis (HCC) 10/15/2015   Anxiety state 10/15/2015   Essential hypertension 10/15/2015   Hypokalemia 10/15/2015   COPD with acute exacerbation (HCC) 08/14/2014   Acute-on-chronic respiratory failure (HCC) 03/13/2014   CAP (community acquired pneumonia) 03/11/2014   Community acquired pneumonia 03/11/2014   COPD exacerbation (HCC) 04/22/2013   GERD (gastroesophageal reflux disease) 12/21/2012   Constipation 12/21/2012   Healthcare-associated pneumonia 02/03/2012   Candida infection 11/26/2011   Viral syndrome 11/21/2011   DM (diabetes mellitus) (HCC) 11/21/2011   Oral candidiasis 07/03/2011   Low back pain 06/16/2011   Muscle spasms of lower extremity 06/16/2011   Cigarette smoker 09/12/2010   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     Family History  Problem Relation Age of Onset   Coronary artery disease Brother    Diabetes Other    Cancer Other    Hypertension Other     Social History   Tobacco Use   Smoking status: Every Day    Packs/day:  0.50    Years: 42.00    Pack years: 21.00    Types: Cigarettes   Smokeless tobacco: Never  Vaping Use   Vaping Use: Never used  Substance Use Topics   Alcohol use: No   Drug use: No    Comment: Formerly used cocaine , narcotics, THC    Home Medications Prior to Admission medications   Medication Sig Start Date End Date Taking? Authorizing Provider  meloxicam (MOBIC) 15 MG tablet Take 1 tablet (15 mg total) by mouth daily. 07/17/21 08/16/21 Yes Trust Crago S, PA-C  oxyCODONE (ROXICODONE) 5 MG  immediate release tablet Take 1 tablet (5 mg total) by mouth every 6 (six) hours as needed for up to 7 days for severe pain. 07/17/21 07/24/21 Yes Jeanpierre Thebeau S, PA-C  acetaminophen (TYLENOL) 500 MG tablet Take 500 mg by mouth every 6 (six) hours as needed for fever.     [provider]  albuterol (PROVENTIL) (2.5 MG/3ML) 0.083% nebulizer solution Take 3 mLs (2.5 mg total) by nebulization every 6 (six) hours as needed for wheezing or shortness of breath. 09/27/20   Bethann Berkshire, MD  Budeson-Glycopyrrol-Formoterol (BREZTRI AEROSPHERE) 160-9-4.8 MCG/ACT AERO Inhale 2 puffs into the lungs 2 (two) times daily. 04/23/20   Nyoka Cowden, MD  cefdinir (OMNICEF) 300 MG capsule Take 1 capsule (300 mg total) by mouth 2 (two) times daily. 09/27/20   Bethann Berkshire, MD  clotrimazole (MYCELEX) 10 MG troche Take 1 tablet (10 mg total) by mouth 5 (five) times daily. 04/23/20   Nyoka Cowden, MD  fluticasone (FLONASE) 50 MCG/ACT nasal spray Place 2 sprays into both nostrils daily. 04/13/18   [provider]  glucose blood test strip 1 each by Other route 3 (three) times daily. Use as instructed, accucheck test strips 01/04/20   Corum, Minerva Fester, MD  Insulin Glargine (LANTUS) 100 UNIT/ML Solostar Pen Inject 15 Units into the skin daily. 12/02/19   Cleora Fleet, MD  Insulin Pen Needle 31G X 5 MM MISC Use as directed 03/12/20   Wandra Feinstein, MD  irbesartan (AVAPRO) 150 MG tablet TAKE 1 TABLET BY MOUTH EVERY DAY 05/29/21   Nyoka Cowden, MD  metFORMIN (GLUCOPHAGE) 500 MG tablet Take 2 tablets (1,000 mg total) by mouth 2 (two) times daily. 01/04/20   Corum, Minerva Fester, MD  ondansetron (ZOFRAN ODT) 4 MG disintegrating tablet Take 1 tablet (4 mg total) by mouth every 8 (eight) hours as needed for nausea or vomiting. 03/07/21   Haskel Schroeder, PA-C  pantoprazole (PROTONIX) 40 MG tablet Take 1 tablet (40 mg total) by mouth daily. 03/11/20   Corum, Minerva Fester, MD  SUMAtriptan (IMITREX) 50 MG tablet Take 1 tablet  (50 mg total) by mouth daily as needed for migraine or headache. 03/11/20   Corum, Minerva Fester, MD  ARIPiprazole (ABILIFY) 5 MG tablet Take 5 mg by mouth daily.    02/03/12  [provider]  venlafaxine (EFFEXOR) 75 MG tablet Take 75 mg by mouth daily.    02/03/12  [provider]    Allergies    Penicillins, Levaquin [levofloxacin in d5w], Doxycycline, and Morphine  Review of Systems   Review of Systems  Constitutional:  Negative for fever.  Eyes:  Negative for visual disturbance.  Respiratory:  Negative for shortness of breath.   Cardiovascular:  Negative for chest pain.  Gastrointestinal:  Negative for abdominal pain.  Genitourinary:  Negative for pelvic pain.  Musculoskeletal:  Negative for back pain.  Physical Exam Updated Vital Signs BP 122/83   Pulse (!) 103   Temp 98.1 F (36.7 C) (Oral)   Resp 20   Ht 5' (1.524 m)   Wt 54.4 kg   SpO2 100%   BMI 23.44 kg/m   Physical Exam Vitals and nursing note reviewed.  Constitutional:      General: She is not in acute distress.    Appearance: She is well-developed.  HENT:     Head: Normocephalic and atraumatic.  Eyes:     Conjunctiva/sclera: Conjunctivae normal.  Cardiovascular:     Rate and Rhythm: Normal rate and regular rhythm.  Pulmonary:     Effort: Pulmonary effort is normal.     Breath sounds: Wheezing present.  Abdominal:     Palpations: Abdomen is soft.     Tenderness: There is no abdominal tenderness.  Musculoskeletal:     Cervical back: Neck supple.  Skin:    General: Skin is warm and dry.  Neurological:     Mental Status: She is alert.    ED Results / Procedures / Treatments   Labs (all labs ordered are listed, but only abnormal results are displayed) Labs Reviewed - No data to display  EKG None  Radiology No results found.  Procedures Procedures   Medications Ordered in ED Medications  oxyCODONE (Oxy IR/ROXICODONE) immediate release tablet 10 mg (10 mg Oral Given 07/17/21 2203)   albuterol (VENTOLIN HFA) 108 (90 Base) MCG/ACT inhaler 2 puff (2 puffs Inhalation Given 07/17/21 2203)    ED Course  I have reviewed the triage vital signs and the nursing notes.  Pertinent labs & imaging results that were available during my care of the patient were reviewed by me and considered in my medical decision making (see chart for details).    MDM Rules/Calculators/A&P                          55 year old female here for medication refill.  She is in between PCPs right now as hers recently retired.  PDMP review completed and patient has received regular prescriptions from the same provider up until last month.  She is due for new prescription of her oxycodone which we will refill.  She has an appointment coming up with a PCP next month.  We will also refill her albuterol and her meloxicam.  She is advised to return to the ED for any new or worsening symptoms   Final Clinical Impression(s) / ED Diagnoses Final diagnoses:  Medication refill    Rx / DC Orders ED Discharge Orders          Ordered    oxyCODONE (ROXICODONE) 5 MG immediate release tablet  Every 6 hours PRN        07/17/21 2152    meloxicam (MOBIC) 15 MG tablet  Daily        07/17/21 2155             Karrie Meres, PA-C 07/17/21 2153    Karrie Meres, PA-C 07/17/21 2213    Pollyann Savoy, MD 07/17/21 [phone]

## 2021-07-17 NOTE — Discharge Instructions (Addendum)
Please follow up with your primary care provider within 5-7 days for re-evaluation of your symptoms. If you do not have a primary care provider, information for a healthcare clinic has been provided for you to make arrangements for follow up care. Please return to the emergency department for any new or worsening symptoms. ° °

## 2021-08-16 ENCOUNTER — Emergency Department (HOSPITAL_COMMUNITY): Payer: Medicaid Other

## 2021-08-16 ENCOUNTER — Encounter (HOSPITAL_COMMUNITY): Payer: Self-pay | Admitting: Emergency Medicine

## 2021-08-16 ENCOUNTER — Other Ambulatory Visit: Payer: Self-pay

## 2021-08-16 ENCOUNTER — Inpatient Hospital Stay (HOSPITAL_COMMUNITY)
Admission: EM | Admit: 2021-08-16 | Discharge: 2021-08-19 | DRG: 871 | Disposition: A | Discharge status: Home / Self Care | Payer: Medicaid Other | Attending: Internal Medicine | Admitting: Internal Medicine

## 2021-08-16 DIAGNOSIS — J9620 Acute and chronic respiratory failure, unspecified whether with hypoxia or hypercapnia: Secondary | ICD-10-CM | POA: Diagnosis present

## 2021-08-16 DIAGNOSIS — M549 Dorsalgia, unspecified: Secondary | ICD-10-CM | POA: Diagnosis present

## 2021-08-16 DIAGNOSIS — E869 Volume depletion, unspecified: Secondary | ICD-10-CM | POA: Diagnosis present

## 2021-08-16 DIAGNOSIS — Z794 Long term (current) use of insulin: Secondary | ICD-10-CM

## 2021-08-16 DIAGNOSIS — J9621 Acute and chronic respiratory failure with hypoxia: Secondary | ICD-10-CM | POA: Diagnosis not present

## 2021-08-16 DIAGNOSIS — Z9851 Tubal ligation status: Secondary | ICD-10-CM | POA: Diagnosis not present

## 2021-08-16 DIAGNOSIS — F419 Anxiety disorder, unspecified: Secondary | ICD-10-CM | POA: Diagnosis present

## 2021-08-16 DIAGNOSIS — J189 Pneumonia, unspecified organism: Secondary | ICD-10-CM | POA: Diagnosis not present

## 2021-08-16 DIAGNOSIS — Z8249 Family history of ischemic heart disease and other diseases of the circulatory system: Secondary | ICD-10-CM | POA: Diagnosis not present

## 2021-08-16 DIAGNOSIS — K219 Gastro-esophageal reflux disease without esophagitis: Secondary | ICD-10-CM | POA: Diagnosis present

## 2021-08-16 DIAGNOSIS — Z9981 Dependence on supplemental oxygen: Secondary | ICD-10-CM

## 2021-08-16 DIAGNOSIS — E871 Hypo-osmolality and hyponatremia: Secondary | ICD-10-CM | POA: Diagnosis present

## 2021-08-16 DIAGNOSIS — J432 Centrilobular emphysema: Secondary | ICD-10-CM | POA: Diagnosis present

## 2021-08-16 DIAGNOSIS — J9611 Chronic respiratory failure with hypoxia: Secondary | ICD-10-CM | POA: Diagnosis present

## 2021-08-16 DIAGNOSIS — Z885 Allergy status to narcotic agent status: Secondary | ICD-10-CM

## 2021-08-16 DIAGNOSIS — J181 Lobar pneumonia, unspecified organism: Secondary | ICD-10-CM | POA: Diagnosis present

## 2021-08-16 DIAGNOSIS — Z833 Family history of diabetes mellitus: Secondary | ICD-10-CM

## 2021-08-16 DIAGNOSIS — E119 Type 2 diabetes mellitus without complications: Secondary | ICD-10-CM | POA: Diagnosis not present

## 2021-08-16 DIAGNOSIS — E1165 Type 2 diabetes mellitus with hyperglycemia: Secondary | ICD-10-CM | POA: Diagnosis present

## 2021-08-16 DIAGNOSIS — R109 Unspecified abdominal pain: Secondary | ICD-10-CM

## 2021-08-16 DIAGNOSIS — Z23 Encounter for immunization: Secondary | ICD-10-CM

## 2021-08-16 DIAGNOSIS — G894 Chronic pain syndrome: Secondary | ICD-10-CM | POA: Diagnosis present

## 2021-08-16 DIAGNOSIS — R932 Abnormal findings on diagnostic imaging of liver and biliary tract: Secondary | ICD-10-CM | POA: Diagnosis not present

## 2021-08-16 DIAGNOSIS — Z20822 Contact with and (suspected) exposure to covid-19: Secondary | ICD-10-CM | POA: Diagnosis present

## 2021-08-16 DIAGNOSIS — J9612 Chronic respiratory failure with hypercapnia: Secondary | ICD-10-CM | POA: Diagnosis present

## 2021-08-16 DIAGNOSIS — A419 Sepsis, unspecified organism: Principal | ICD-10-CM

## 2021-08-16 DIAGNOSIS — F17210 Nicotine dependence, cigarettes, uncomplicated: Secondary | ICD-10-CM | POA: Diagnosis present

## 2021-08-16 DIAGNOSIS — J441 Chronic obstructive pulmonary disease with (acute) exacerbation: Principal | ICD-10-CM | POA: Diagnosis present

## 2021-08-16 DIAGNOSIS — Z79899 Other long term (current) drug therapy: Secondary | ICD-10-CM

## 2021-08-16 DIAGNOSIS — Z881 Allergy status to other antibiotic agents status: Secondary | ICD-10-CM | POA: Diagnosis not present

## 2021-08-16 DIAGNOSIS — Z880 Allergy status to penicillin: Secondary | ICD-10-CM

## 2021-08-16 DIAGNOSIS — R0602 Shortness of breath: Secondary | ICD-10-CM

## 2021-08-16 DIAGNOSIS — I10 Essential (primary) hypertension: Secondary | ICD-10-CM | POA: Diagnosis present

## 2021-08-16 LAB — CBC
HCT: 38.4 % (ref 36.0–46.0)
Hemoglobin: 11.9 g/dL — ABNORMAL LOW (ref 12.0–15.0)
MCH: 26.7 pg (ref 26.0–34.0)
MCHC: 31 g/dL (ref 30.0–36.0)
MCV: 86.3 fL (ref 80.0–100.0)
Platelets: 461 10*3/uL — ABNORMAL HIGH (ref 150–400)
RBC: 4.45 MIL/uL (ref 3.87–5.11)
RDW: 13.7 % (ref 11.5–15.5)
WBC: 18.3 10*3/uL — ABNORMAL HIGH (ref 4.0–10.5)
nRBC: 0 % (ref 0.0–0.2)

## 2021-08-16 LAB — LACTIC ACID, PLASMA: Lactic Acid, Venous: 1 mmol/L (ref 0.5–1.9)

## 2021-08-16 LAB — RESP PANEL BY RT-PCR (FLU A&B, COVID) ARPGX2
Influenza A by PCR: NEGATIVE
Influenza B by PCR: NEGATIVE
SARS Coronavirus 2 by RT PCR: NEGATIVE

## 2021-08-16 LAB — BASIC METABOLIC PANEL
Anion gap: 9 (ref 5–15)
BUN: 19 mg/dL (ref 6–20)
CO2: 36 mmol/L — ABNORMAL HIGH (ref 22–32)
Calcium: 8.8 mg/dL — ABNORMAL LOW (ref 8.9–10.3)
Chloride: 85 mmol/L — ABNORMAL LOW (ref 98–111)
Creatinine, Ser: 0.75 mg/dL (ref 0.44–1.00)
GFR, Estimated: >60 mL/min (ref >60)
Glucose, Bld: 330 mg/dL — ABNORMAL HIGH (ref 70–99)
Potassium: 3.7 mmol/L (ref 3.5–5.1)
Sodium: 130 mmol/L — ABNORMAL LOW (ref 135–145)

## 2021-08-16 MED ORDER — MAGNESIUM SULFATE 2 GM/50ML IV SOLN
2.0000 g | Freq: Once | INTRAVENOUS | Status: AC
  Administered 2021-08-16: 2 g via INTRAVENOUS
  Filled 2021-08-16: qty 50

## 2021-08-16 MED ORDER — HYDROCODONE-ACETAMINOPHEN 5-325 MG PO TABS
1.0000 | ORAL_TABLET | Freq: Once | ORAL | Status: AC
  Administered 2021-08-16: 1 via ORAL
  Filled 2021-08-16: qty 1

## 2021-08-16 MED ORDER — IPRATROPIUM BROMIDE 0.02 % IN SOLN
RESPIRATORY_TRACT | Status: AC
  Administered 2021-08-16: 0.5 mg
  Filled 2021-08-16: qty 2.5

## 2021-08-16 MED ORDER — METHYLPREDNISOLONE SODIUM SUCC 125 MG IJ SOLR
125.0000 mg | Freq: Once | INTRAMUSCULAR | Status: AC
  Administered 2021-08-16: 125 mg via INTRAVENOUS
  Filled 2021-08-16: qty 2

## 2021-08-16 MED ORDER — SODIUM CHLORIDE 0.9 % IV SOLN
2.0000 g | INTRAVENOUS | Status: DC
  Administered 2021-08-16 – 2021-08-18 (×3): 2 g via INTRAVENOUS
  Filled 2021-08-16 (×3): qty 20

## 2021-08-16 MED ORDER — SODIUM CHLORIDE 0.9 % IV BOLUS
1000.0000 mL | Freq: Once | INTRAVENOUS | Status: AC
  Administered 2021-08-16: 1000 mL via INTRAVENOUS

## 2021-08-16 MED ORDER — IPRATROPIUM-ALBUTEROL 0.5-2.5 (3) MG/3ML IN SOLN
3.0000 mL | Freq: Four times a day (QID) | RESPIRATORY_TRACT | Status: DC
  Administered 2021-08-17 – 2021-08-19 (×11): 3 mL via RESPIRATORY_TRACT
  Filled 2021-08-16 (×12): qty 3

## 2021-08-16 MED ORDER — ALBUTEROL SULFATE (2.5 MG/3ML) 0.083% IN NEBU
INHALATION_SOLUTION | RESPIRATORY_TRACT | Status: AC
  Administered 2021-08-16: 2.5 mg
  Filled 2021-08-16: qty 3

## 2021-08-16 MED ORDER — SODIUM CHLORIDE 0.9 % IV SOLN: 2.0000 g | Freq: Three times a day (TID) | INTRAVENOUS | Status: DC

## 2021-08-16 MED ORDER — ALBUTEROL SULFATE HFA 108 (90 BASE) MCG/ACT IN AERS
2.0000 | INHALATION_SPRAY | RESPIRATORY_TRACT | Status: DC | PRN
  Filled 2021-08-16: qty 6.7

## 2021-08-16 MED ORDER — VANCOMYCIN HCL IN DEXTROSE 1-5 GM/200ML-% IV SOLN: 1000.0000 mg | Freq: Once | INTRAVENOUS | Status: DC

## 2021-08-16 MED ORDER — ALBUTEROL SULFATE (2.5 MG/3ML) 0.083% IN NEBU: 2.5000 mg | INHALATION_SOLUTION | RESPIRATORY_TRACT | Status: DC | PRN

## 2021-08-16 MED ORDER — ALBUTEROL SULFATE (2.5 MG/3ML) 0.083% IN NEBU
INHALATION_SOLUTION | RESPIRATORY_TRACT | Status: AC
  Administered 2021-08-16: 5 mg
  Filled 2021-08-16: qty 6

## 2021-08-16 MED ORDER — SODIUM CHLORIDE 0.9 % IV SOLN
500.0000 mg | Freq: Once | INTRAVENOUS | Status: AC
  Administered 2021-08-16: 500 mg via INTRAVENOUS
  Filled 2021-08-16: qty 500

## 2021-08-16 NOTE — H&P (Signed)
History and Physical  [name] LSL:[id number] DOB: [birth year] DOA: 08/16/2021  Referring physician: Dr Estell Harpin, ED physician PCP: Pcp, No  Outpatient Specialists:   Patient Coming From: Home  Chief Complaint: Shortness of breath, cough, fevers  HPI: [name] is a 55 y.o. female with a history of COPD, diabetes, hypertension, GERD, chronic respiratory failure on chronic oxygen at 3 L nasal cannula.  Patient began having cough, shortness of breath and wheezing that started 4 days ago and has been gradually worsening since then.  She attempted to make an appointment with her doctor, but was only able to have a virtual of visit with them.  She was not prescribed any medication.  She was try to use her inhalers and her home medications without improvement.  No palliating or provoking factors.  She has had nausea, no vomiting.  Also admits to some fevers and chills  Emergency Department Course: X-rays suggestive of pneumonia.  White count 18,000.  Lactic acid normal.  Blood cultures obtained  Review of Systems:   Pt denies any vomiting, diarrhea, constipation, abdominal pain,  palpitations, headache, vision changes, lightheadedness, dizziness, melena, rectal bleeding.  Review of systems are otherwise negative  Past Medical History:  Diagnosis Date   Angina    Anxiety state 10/15/2015   ARDS (adult respiratory distress syndrome) (HCC)    Jan 2011   Asthma    Chronic back pain    COPD (chronic obstructive pulmonary disease) (HCC)    Diabetes mellitus    GERD (gastroesophageal reflux disease) 12/21/2012   HTN (hypertension) 10/15/2015   Hyperglycemia, drug-induced    steroid induced hyperglycemia   Migraine headache    On home O2    Pneumonia    Recurrent upper respiratory infection (URI)    Shortness of breath    Past Surgical History:  Procedure Laterality Date   c-section     TRACHEOSTOMY     decannulated 12/2009   TUBAL LIGATION     uterine ablasion     Social  History:  reports that she has been smoking cigarettes. She has a 21.00 pack-year smoking history. She has never used smokeless tobacco. She reports that she does not drink alcohol and does not use drugs. Patient lives at home  Allergies  Allergen Reactions   Penicillins Other (See Comments)    convulsions   Levaquin [Levofloxacin In D5w] Itching and Swelling    IV only   Doxycycline Nausea And Vomiting   Morphine Nausea Only    Family History  Problem Relation Age of Onset   Coronary artery disease Brother    Diabetes Other    Cancer Other    Hypertension Other       Prior to Admission medications   Medication Sig Start Date End Date Taking? Authorizing Provider  acetaminophen (TYLENOL) 500 MG tablet Take 500 mg by mouth every 6 (six) hours as needed for fever.     [provider]  albuterol (PROVENTIL) (2.5 MG/3ML) 0.083% nebulizer solution Take 3 mLs (2.5 mg total) by nebulization every 6 (six) hours as needed for wheezing or shortness of breath. 09/27/20   Bethann Berkshire, MD  Budeson-Glycopyrrol-Formoterol (BREZTRI AEROSPHERE) 160-9-4.8 MCG/ACT AERO Inhale 2 puffs into the lungs 2 (two) times daily. 04/23/20   Nyoka Cowden, MD  cefdinir (OMNICEF) 300 MG capsule Take 1 capsule (300 mg total) by mouth 2 (two) times daily. 09/27/20   Bethann Berkshire, MD  clotrimazole (MYCELEX) 10 MG troche Take 1 tablet (10 mg total)  by mouth 5 (five) times daily. 04/23/20   Nyoka Cowden, MD  fluticasone (FLONASE) 50 MCG/ACT nasal spray Place 2 sprays into both nostrils daily. 04/13/18   [provider]  glucose blood test strip 1 each by Other route 3 (three) times daily. Use as instructed, accucheck test strips 01/04/20   Corum, Minerva Fester, MD  Insulin Glargine (LANTUS) 100 UNIT/ML Solostar Pen Inject 15 Units into the skin daily. 12/02/19   Cleora Fleet, MD  Insulin Pen Needle 31G X 5 MM MISC Use as directed 03/12/20   Wandra Feinstein, MD  irbesartan (AVAPRO) 150 MG tablet TAKE 1  TABLET BY MOUTH EVERY DAY 05/29/21   Nyoka Cowden, MD  meloxicam (MOBIC) 15 MG tablet Take 1 tablet (15 mg total) by mouth daily. 07/17/21 08/16/21  Couture, Cortni S, PA-C  metFORMIN (GLUCOPHAGE) 500 MG tablet Take 2 tablets (1,000 mg total) by mouth 2 (two) times daily. 01/04/20   Corum, Minerva Fester, MD  ondansetron (ZOFRAN ODT) 4 MG disintegrating tablet Take 1 tablet (4 mg total) by mouth every 8 (eight) hours as needed for nausea or vomiting. 03/07/21   Haskel Schroeder, PA-C  pantoprazole (PROTONIX) 40 MG tablet Take 1 tablet (40 mg total) by mouth daily. 03/11/20   Corum, Minerva Fester, MD  SUMAtriptan (IMITREX) 50 MG tablet Take 1 tablet (50 mg total) by mouth daily as needed for migraine or headache. 03/11/20   Corum, Minerva Fester, MD  ARIPiprazole (ABILIFY) 5 MG tablet Take 5 mg by mouth daily.    02/03/12  [provider]  venlafaxine (EFFEXOR) 75 MG tablet Take 75 mg by mouth daily.    02/03/12  [provider]    Physical Exam: BP (!) 113/58   Pulse (!) 114   Temp 98.6 F (37 C) (Oral)   Resp (!) 26   Ht 5' (1.524 m)   Wt 53.5 kg   SpO2 93%   BMI 23.05 kg/m   General: Middle-age female. Awake and alert and oriented x3. No acute cardiopulmonary distress.  HEENT: Normocephalic atraumatic.  Right and left ears normal in appearance.  Pupils equal, round, reactive to light. Extraocular muscles are intact. Sclerae anicteric and noninjected.  Moist mucosal membranes. No mucosal lesions.  Neck: Neck supple without lymphadenopathy. No carotid bruits. No masses palpated.  Cardiovascular: Regular rate with normal S1-S2 sounds. No murmurs, rubs, gallops auscultated. No JVD.  Respiratory: Diffuse wheezing throughout with rales and egophony in the bases.  No accessory muscle use. Abdomen: Soft, nontender, nondistended. Active bowel sounds. No masses or hepatosplenomegaly  Skin: No rashes, lesions, or ulcerations.  Dry, warm to touch. 2+ dorsalis pedis and radial pulses. Musculoskeletal: No  calf or leg pain. All major joints not erythematous nontender.  No upper or lower joint deformation.  Good ROM.  No contractures  Psychiatric: Intact judgment and insight. Pleasant and cooperative. Neurologic: No focal neurological deficits. Strength is 5/5 and symmetric in upper and lower extremities.  Cranial nerves II through XII are grossly intact.           Labs on Admission: I have personally reviewed following labs and imaging studies  CBC: Recent Labs  Lab 08/16/21 1910  WBC 18.3*  HGB 11.9*  HCT 38.4  MCV 86.3  PLT 461*   Basic Metabolic Panel: Recent Labs  Lab 08/16/21 1910  NA 130*  K 3.7  CL 85*  CO2 36*  GLUCOSE 330*  BUN 19  CREATININE 0.75  CALCIUM 8.8*  GFR: Estimated Creatinine Clearance: 59.1 mL/min (by C-G formula based on SCr of 0.75 mg/dL). Liver Function Tests: No results for input(s): AST, ALT, ALKPHOS, BILITOT, PROT, ALBUMIN in the last 168 hours. No results for input(s): LIPASE, AMYLASE in the last 168 hours. No results for input(s): AMMONIA in the last 168 hours. Coagulation Profile: No results for input(s): INR, PROTIME in the last 168 hours. Cardiac Enzymes: No results for input(s): CKTOTAL, CKMB, CKMBINDEX, TROPONINI in the last 168 hours. BNP (last 3 results) No results for input(s): PROBNP in the last 8760 hours. HbA1C: No results for input(s): HGBA1C in the last 72 hours. CBG: No results for input(s): GLUCAP in the last 168 hours. Lipid Profile: No results for input(s): CHOL, HDL, LDLCALC, TRIG, CHOLHDL, LDLDIRECT in the last 72 hours. Thyroid Function Tests: No results for input(s): TSH, T4TOTAL, FREET4, T3FREE, THYROIDAB in the last 72 hours. Anemia Panel: No results for input(s): VITAMINB12, FOLATE, FERRITIN, TIBC, IRON, RETICCTPCT in the last 72 hours. Urine analysis:    Component Value Date/Time   COLORURINE YELLOW 04/21/2020 1430   APPEARANCEUR CLEAR 04/21/2020 1430   LABSPEC 1.014 04/21/2020 1430   PHURINE 5.0  04/21/2020 1430   GLUCOSEU >=500 (A) 04/21/2020 1430   HGBUR SMALL (A) 04/21/2020 1430   BILIRUBINUR NEGATIVE 04/21/2020 1430   KETONESUR NEGATIVE 04/21/2020 1430   PROTEINUR NEGATIVE 04/21/2020 1430   UROBILINOGEN 1.0 07/24/2014 1605   NITRITE NEGATIVE 04/21/2020 1430   LEUKOCYTESUR NEGATIVE 04/21/2020 1430   Sepsis Labs: @LABRCNTIP (procalcitonin:4,lacticidven:4) ) Recent Results (from the past 240 hour(s))  Resp Panel by RT-PCR (Flu A&B, Covid) Nasopharyngeal Swab     Status: None   Collection Time: 08/16/21  7:10 PM   Specimen: Nasopharyngeal Swab; Nasopharyngeal(NP) swabs in vial transport medium  Result Value Ref Range Status   SARS Coronavirus 2 by RT PCR NEGATIVE NEGATIVE Final    Comment: (NOTE) SARS-CoV-2 target nucleic acids are NOT DETECTED.  The SARS-CoV-2 RNA is generally detectable in upper respiratory specimens during the acute phase of infection. The lowest concentration of SARS-CoV-2 viral copies this assay can detect is 138 copies/mL. A negative result does not preclude SARS-Cov-2 infection and should not be used as the sole basis for treatment or other patient management decisions. A negative result may occur with  improper specimen collection/handling, submission of specimen other than nasopharyngeal swab, presence of viral mutation(s) within the areas targeted by this assay, and inadequate number of viral copies(<138 copies/mL). A negative result must be combined with clinical observations, patient history, and epidemiological information. The expected result is Negative.  Fact Sheet for Patients:  08/18/21  Fact Sheet for Healthcare Providers:  BloggerCourse.com  This test is no t yet approved or cleared by the [employer] FDA and  has been authorized for detection and/or diagnosis of SARS-CoV-2 by FDA under an Emergency Use Authorization (EUA). This EUA will remain  in effect (meaning this  test can be used) for the duration of the COVID-19 declaration under Section 564(b)(1) of the Act, 21 U.S.C.section 360bbb-3(b)(1), unless the authorization is terminated  or revoked sooner.       Influenza A by PCR NEGATIVE NEGATIVE Final   Influenza B by PCR NEGATIVE NEGATIVE Final    Comment: (NOTE) The Xpert Xpress SARS-CoV-2/FLU/RSV plus assay is intended as an aid in the diagnosis of influenza from Nasopharyngeal swab specimens and should not be used as a sole basis for treatment. Nasal washings and aspirates are unacceptable for Xpert Xpress SARS-CoV-2/FLU/RSV testing.  Fact Sheet for Patients:  BloggerCourse.com  Fact Sheet for Healthcare Providers: [employer]it  This test is not yet approved or cleared by the [place] FDA and has been authorized for detection and/or diagnosis of SARS-CoV-2 by FDA under an Emergency Use Authorization (EUA). This EUA will remain in effect (meaning this test can be used) for the duration of the COVID-19 declaration under Section 564(b)(1) of the Act, 21 U.S.C. section 360bbb-3(b)(1), unless the authorization is terminated or revoked.  Performed at [facility], [address], [place], [place] [address]      Radiological Exams on Admission: [facility] 1 View  Result Date: 08/16/2021 CLINICAL DATA:  Cough, shortness of breath EXAM: PORTABLE CHEST 1 VIEW COMPARISON:  March 07, 2021 FINDINGS: The cardiomediastinal silhouette is unchanged in contour. No pleural effusion. No pneumothorax. Increased diffuse bilateral reticulonodular opacities in a mildly peripheral predominance. Peribronchial cuffing. Visualized abdomen is unremarkable. IMPRESSION: Increased diffuse bilateral reticulonodular opacities in a peripheral predominant pattern. Findings likely are infectious in etiology. Electronically Signed   By: Meda Klinefelter M.D.   On: 08/16/2021 19:44    EKG: Independently reviewed.   Pending  Assessment/Plan: Active Problems:   Cigarette smoker   DM (diabetes mellitus) (HCC)   GERD (gastroesophageal reflux disease)   CAP (community acquired pneumonia)   Acute-on-chronic respiratory failure (HCC)   COPD with acute exacerbation (HCC)   Essential hypertension    This patient was discussed with the ED physician, including pertinent vitals, physical exam findings, labs, and imaging.  We also discussed care given by the ED provider.  Acute on chronic respiratory failure with Community-acquired pneumonia and COPD exacerbation Antibiotics: Rocephin and azithromycin Continue steroids Robitussin Blood cultures drawn in the emergency department Sputum cultures CBC tomorrow Strep and Legionella antigen by urine Influenza screen and COVID negative Continue long-acting bronchodilators and ICS DuoNebs every 6 hours with albuterol every 2 as needed Diabetes Hold metformin Continue long-acting insulin with CBGs before meals and nightly Hypertension Blood pressure little low.  We will hold antihypertensives for now GERD Continue with Protonix    DVT prophylaxis: Lovenox Consultants: None Code Status: Full code Family Communication: None Disposition Plan: Patient should be able to return home following admission   Levie Heritage, DO

## 2021-08-16 NOTE — ED Triage Notes (Signed)
Pt brought in by [facility] after c/o SOB. Pt with hx of COPD. Pt with productive cough (green sputum). EMS states pt on O2 @ 3L at all times. Sats were 98% upon their arrival. Blood glucose 345. Pt given 1 Albuterol neb en route. Pt with wheezes bilaterally in all lung fields.

## 2021-08-16 NOTE — ED Provider Notes (Signed)
[facility] EMERGENCY DEPARTMENT Provider Note   CSN: [id number] Arrival date & time: 08/16/21  1905     History Chief Complaint  Patient presents with   Shortness of Breath    [name] is a 55 y.o. female.  Patient presents with shortness of breath and cough with yellow sputum production.  She is normally on 3 L nasal for COPD and is more short of breath  The history is provided by the patient. No language interpreter was used.  Shortness of Breath Severity:  Moderate Onset quality:  Sudden Duration:  2 days Timing:  Constant Progression:  Worsening Chronicity:  New Context: activity   Associated symptoms: no abdominal pain, no chest pain, no cough, no headaches and no rash       Past Medical History:  Diagnosis Date   Angina    Anxiety state 10/15/2015   ARDS (adult respiratory distress syndrome) (HCC)    Jan 2011   Asthma    Chronic back pain    COPD (chronic obstructive pulmonary disease) (HCC)    Diabetes mellitus    GERD (gastroesophageal reflux disease) 12/21/2012   HTN (hypertension) 10/15/2015   Hyperglycemia, drug-induced    steroid induced hyperglycemia   Migraine headache    On home O2    Pneumonia    Recurrent upper respiratory infection (URI)    Shortness of breath     Patient Active Problem List   Diagnosis Date Noted   Chronic respiratory failure with hypoxia and hypercapnia (HCC) 04/23/2020   Hoarseness or changing voice 01/11/2020   Nausea and vomiting 01/11/2020   Acute on chronic respiratory failure with hypoxia and hypercapnia (HCC) 11/27/2019   Chronic pain syndrome 11/27/2019   Lobar pneumonia (HCC) 11/27/2019   Atypical pneumonia 09/28/2016   Chronic obstructive pulmonary disease (HCC)    Pneumonia 05/30/2016   SIRS (systemic inflammatory response syndrome) (HCC) 10/16/2015   Sepsis (HCC) 10/15/2015   Anxiety state 10/15/2015   Essential hypertension 10/15/2015   Hypokalemia 10/15/2015   COPD with acute exacerbation (HCC)  08/14/2014   Acute-on-chronic respiratory failure (HCC) 03/13/2014   CAP (community acquired pneumonia) 03/11/2014   Community acquired pneumonia 03/11/2014   COPD exacerbation (HCC) 04/22/2013   GERD (gastroesophageal reflux disease) 12/21/2012   Constipation 12/21/2012   Healthcare-associated pneumonia 02/03/2012   Candida infection 11/26/2011   Viral syndrome 11/21/2011   DM (diabetes mellitus) (HCC) 11/21/2011   Oral candidiasis 07/03/2011   Low back pain 06/16/2011   Muscle spasms of lower extremity 06/16/2011   Cigarette smoker 09/12/2010   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     Family History  Problem Relation Age of Onset   Coronary artery disease Brother    Diabetes Other    Cancer Other    Hypertension Other     Social History   Tobacco Use   Smoking status: Every Day    Packs/day: 0.50    Years: 42.00    Pack years: 21.00    Types: Cigarettes   Smokeless tobacco: Never  Vaping Use  Vaping Use: Never used  Substance Use Topics   Alcohol use: No   Drug use: No    Comment: Formerly used cocaine , narcotics, THC    Home Medications Prior to Admission medications   Medication Sig Start Date End Date Taking? Authorizing Provider  acetaminophen (TYLENOL) 500 MG tablet Take 500 mg by mouth every 6 (six) hours as needed for fever.     [provider]  albuterol (PROVENTIL) (2.5 MG/3ML) 0.083% nebulizer solution Take 3 mLs (2.5 mg total) by nebulization every 6 (six) hours as needed for wheezing or shortness of breath. 09/27/20   Bethann Berkshire, MD   Budeson-Glycopyrrol-Formoterol (BREZTRI AEROSPHERE) 160-9-4.8 MCG/ACT AERO Inhale 2 puffs into the lungs 2 (two) times daily. 04/23/20   Nyoka Cowden, MD  cefdinir (OMNICEF) 300 MG capsule Take 1 capsule (300 mg total) by mouth 2 (two) times daily. 09/27/20   Bethann Berkshire, MD  clotrimazole (MYCELEX) 10 MG troche Take 1 tablet (10 mg total) by mouth 5 (five) times daily. 04/23/20   Nyoka Cowden, MD  fluticasone (FLONASE) 50 MCG/ACT nasal spray Place 2 sprays into both nostrils daily. 04/13/18   [provider]  glucose blood test strip 1 each by Other route 3 (three) times daily. Use as instructed, accucheck test strips 01/04/20   Corum, Minerva Fester, MD  Insulin Glargine (LANTUS) 100 UNIT/ML Solostar Pen Inject 15 Units into the skin daily. 12/02/19   Cleora Fleet, MD  Insulin Pen Needle 31G X 5 MM MISC Use as directed 03/12/20   Wandra Feinstein, MD  irbesartan (AVAPRO) 150 MG tablet TAKE 1 TABLET BY MOUTH EVERY DAY 05/29/21   Nyoka Cowden, MD  meloxicam (MOBIC) 15 MG tablet Take 1 tablet (15 mg total) by mouth daily. 07/17/21 08/16/21  Couture, Cortni S, PA-C  metFORMIN (GLUCOPHAGE) 500 MG tablet Take 2 tablets (1,000 mg total) by mouth 2 (two) times daily. 01/04/20   Corum, Minerva Fester, MD  ondansetron (ZOFRAN ODT) 4 MG disintegrating tablet Take 1 tablet (4 mg total) by mouth every 8 (eight) hours as needed for nausea or vomiting. 03/07/21   Haskel Schroeder, PA-C  pantoprazole (PROTONIX) 40 MG tablet Take 1 tablet (40 mg total) by mouth daily. 03/11/20   Corum, Minerva Fester, MD  SUMAtriptan (IMITREX) 50 MG tablet Take 1 tablet (50 mg total) by mouth daily as needed for migraine or headache. 03/11/20   Corum, Minerva Fester, MD  ARIPiprazole (ABILIFY) 5 MG tablet Take 5 mg by mouth daily.    02/03/12  [provider]  venlafaxine (EFFEXOR) 75 MG tablet Take 75 mg by mouth daily.    02/03/12  [provider]    Allergies    Penicillins, Levaquin [levofloxacin in d5w], Doxycycline, and  Morphine  Review of Systems   Review of Systems  Constitutional:  Negative for appetite change and fatigue.  HENT:  Negative for congestion, ear discharge and sinus pressure.   Eyes:  Negative for discharge.  Respiratory:  Positive for shortness of breath. Negative for cough.   Cardiovascular:  Negative for chest pain.  Gastrointestinal:  Negative for abdominal pain and diarrhea.  Genitourinary:  Negative for frequency and hematuria.  Musculoskeletal:  Negative for back pain.  Skin:  Negative for rash.  Neurological:  Negative for seizures and headaches.  Psychiatric/Behavioral:  Negative for hallucinations.    Physical Exam Updated Vital Signs BP (!) 113/58   Pulse (!) 114   Temp 98.6 F (37 C) (Oral)  Resp (!) 26   Ht 5' (1.524 m)   Wt 53.5 kg   SpO2 93%   BMI 23.05 kg/m   Physical Exam Vitals and nursing note reviewed.  Constitutional:      Appearance: She is well-developed.  HENT:     Head: Normocephalic.     Nose: Nose normal.  Eyes:     General: No scleral icterus.    Conjunctiva/sclera: Conjunctivae normal.  Neck:     Thyroid: No thyromegaly.  Cardiovascular:     Rate and Rhythm: Normal rate and regular rhythm.     Heart sounds: No murmur heard.   No friction rub. No gallop.  Pulmonary:     Breath sounds: No stridor. Wheezing present. No rales.     Comments: Tachypnea Chest:     Chest wall: No tenderness.  Abdominal:     General: There is no distension.     Tenderness: There is no abdominal tenderness. There is no rebound.  Musculoskeletal:        General: Normal range of motion.     Cervical back: Neck supple.  Lymphadenopathy:     Cervical: No cervical adenopathy.  Skin:    Findings: No erythema or rash.  Neurological:     Mental Status: She is alert and oriented to person, place, and time.     Motor: No abnormal muscle tone.     Coordination: Coordination normal.  Psychiatric:        Behavior: Behavior normal.    ED Results / Procedures /  Treatments   Labs (all labs ordered are listed, but only abnormal results are displayed) Labs Reviewed  BASIC METABOLIC PANEL - Abnormal; Notable for the following components:      Result Value   Sodium 130 (*)    Chloride 85 (*)    CO2 36 (*)    Glucose, Bld 330 (*)    Calcium 8.8 (*)    All other components within normal limits  CBC - Abnormal; Notable for the following components:   WBC 18.3 (*)    Hemoglobin 11.9 (*)    Platelets 461 (*)    All other components within normal limits  RESP PANEL BY RT-PCR (FLU A&B, COVID) ARPGX2  CULTURE, BLOOD (ROUTINE X 2)  CULTURE, BLOOD (ROUTINE X 2)  LACTIC ACID, PLASMA    EKG None  Radiology DG Chest Port 1 View  Result Date: 08/16/2021 CLINICAL DATA:  Cough, shortness of breath EXAM: PORTABLE CHEST 1 VIEW COMPARISON:  March 07, 2021 FINDINGS: The cardiomediastinal silhouette is unchanged in contour. No pleural effusion. No pneumothorax. Increased diffuse bilateral reticulonodular opacities in a mildly peripheral predominance. Peribronchial cuffing. Visualized abdomen is unremarkable. IMPRESSION: Increased diffuse bilateral reticulonodular opacities in a peripheral predominant pattern. Findings likely are infectious in etiology. Electronically Signed   By: Meda Klinefelter M.D.   On: 08/16/2021 19:44    Procedures Procedures   Medications Ordered in ED Medications  albuterol (VENTOLIN HFA) 108 (90 Base) MCG/ACT inhaler 2 puff (has no administration in time range)  magnesium sulfate IVPB 2 g 50 mL (2 g Intravenous New Bag/Given 08/16/21 2050)  sodium chloride 0.9 % bolus 1,000 mL (has no administration in time range)  cefTRIAXone (ROCEPHIN) 2 g in sodium chloride 0.9 % 100 mL IVPB (has no administration in time range)  azithromycin (ZITHROMAX) 500 mg in sodium chloride 0.9 % 250 mL IVPB (has no administration in time range)  ipratropium (ATROVENT) 0.02 % nebulizer solution (0.5 mg  Given 08/16/21 2035)  albuterol (PROVENTIL) (2.5  MG/3ML) 0.083% nebulizer solution (5 mg  Given 08/16/21 2035)  sodium chloride 0.9 % bolus 1,000 mL (1,000 mLs Intravenous New Bag/Given 08/16/21 2048)  methylPREDNISolone sodium succinate (SOLU-MEDROL) 125 mg/2 mL injection 125 mg (125 mg Intravenous Given 08/16/21 2048)  HYDROcodone-acetaminophen (NORCO/VICODIN) 5-325 MG per tablet 1 tablet (1 tablet Oral Given 08/16/21 2058)   CRITICAL CARE Performed by: Bethann Berkshire Total critical care time: 40 minutes Critical care time was exclusive of separately billable procedures and treating other patients. Critical care was necessary to treat or prevent imminent or life-threatening deterioration. Critical care was time spent personally by me on the following activities: development of treatment plan with patient and/or surrogate as well as nursing, discussions with consultants, evaluation of patient's response to treatment, examination of patient, obtaining history from patient or surrogate, ordering and performing treatments and interventions, ordering and review of laboratory studies, ordering and review of radiographic studies, pulse oximetry and re-evaluation of patient's condition.  ED Course  I have reviewed the triage vital signs and the nursing notes.  Pertinent labs & imaging results that were available during my care of the patient were reviewed by me and considered in my medical decision making (see chart for details).    MDM Rules/Calculators/A&P                           Patient with pneumonia and exacerbation of COPD she will be admitted to medicine Final Clinical Impression(s) / ED Diagnoses Final diagnoses:  COPD exacerbation [facility])    Rx / DC Orders ED Discharge Orders     None        Bethann Berkshire, MD 08/18/21 1025

## 2021-08-17 ENCOUNTER — Inpatient Hospital Stay (HOSPITAL_COMMUNITY): Payer: Medicaid Other

## 2021-08-17 DIAGNOSIS — A419 Sepsis, unspecified organism: Secondary | ICD-10-CM

## 2021-08-17 DIAGNOSIS — J9612 Chronic respiratory failure with hypercapnia: Secondary | ICD-10-CM

## 2021-08-17 DIAGNOSIS — J9611 Chronic respiratory failure with hypoxia: Secondary | ICD-10-CM | POA: Diagnosis not present

## 2021-08-17 DIAGNOSIS — J441 Chronic obstructive pulmonary disease with (acute) exacerbation: Secondary | ICD-10-CM | POA: Diagnosis not present

## 2021-08-17 DIAGNOSIS — G894 Chronic pain syndrome: Secondary | ICD-10-CM

## 2021-08-17 DIAGNOSIS — F17210 Nicotine dependence, cigarettes, uncomplicated: Secondary | ICD-10-CM | POA: Diagnosis not present

## 2021-08-17 DIAGNOSIS — J181 Lobar pneumonia, unspecified organism: Secondary | ICD-10-CM

## 2021-08-17 LAB — COMPREHENSIVE METABOLIC PANEL
ALT: 23 U/L (ref 0–44)
AST: 31 U/L (ref 15–41)
Albumin: 2.5 g/dL — ABNORMAL LOW (ref 3.5–5.0)
Alkaline Phosphatase: 104 U/L (ref 38–126)
Anion gap: 7 (ref 5–15)
BUN: 15 mg/dL (ref 6–20)
CO2: 35 mmol/L — ABNORMAL HIGH (ref 22–32)
Calcium: 8.7 mg/dL — ABNORMAL LOW (ref 8.9–10.3)
Chloride: 94 mmol/L — ABNORMAL LOW (ref 98–111)
Creatinine, Ser: 0.48 mg/dL (ref 0.44–1.00)
GFR, Estimated: >60 mL/min (ref >60)
Glucose, Bld: 335 mg/dL — ABNORMAL HIGH (ref 70–99)
Potassium: 4 mmol/L (ref 3.5–5.1)
Sodium: 136 mmol/L (ref 135–145)
Total Bilirubin: 0 mg/dL — ABNORMAL LOW (ref 0.3–1.2)
Total Protein: 7.3 g/dL (ref 6.5–8.1)

## 2021-08-17 LAB — CBC
HCT: 33.9 % — ABNORMAL LOW (ref 36.0–46.0)
Hemoglobin: 10.2 g/dL — ABNORMAL LOW (ref 12.0–15.0)
MCH: 26.1 pg (ref 26.0–34.0)
MCHC: 30.1 g/dL (ref 30.0–36.0)
MCV: 86.7 fL (ref 80.0–100.0)
Platelets: 388 10*3/uL (ref 150–400)
RBC: 3.91 MIL/uL (ref 3.87–5.11)
RDW: 13.6 % (ref 11.5–15.5)
WBC: 10.4 10*3/uL (ref 4.0–10.5)
nRBC: 0 % (ref 0.0–0.2)

## 2021-08-17 LAB — TROPONIN I (HIGH SENSITIVITY)
Troponin I (High Sensitivity): 4 ng/L (ref <18)
Troponin I (High Sensitivity): 3 ng/L (ref <18)

## 2021-08-17 LAB — D-DIMER, QUANTITATIVE: D-Dimer, Quant: 1.57 ug/mL-FEU — ABNORMAL HIGH (ref 0.00–0.50)

## 2021-08-17 LAB — GLUCOSE, CAPILLARY
Glucose-Capillary: 171 mg/dL — ABNORMAL HIGH (ref 70–99)
Glucose-Capillary: 259 mg/dL — ABNORMAL HIGH (ref 70–99)
Glucose-Capillary: 260 mg/dL — ABNORMAL HIGH (ref 70–99)
Glucose-Capillary: 342 mg/dL — ABNORMAL HIGH (ref 70–99)
Glucose-Capillary: 371 mg/dL — ABNORMAL HIGH (ref 70–99)

## 2021-08-17 LAB — HEMOGLOBIN A1C
Hgb A1c MFr Bld: 7.6 % — ABNORMAL HIGH (ref 4.8–5.6)
Mean Plasma Glucose: 171.42 mg/dL

## 2021-08-17 LAB — STREP PNEUMONIAE URINARY ANTIGEN: Strep Pneumo Urinary Antigen: NEGATIVE

## 2021-08-17 LAB — EXPECTORATED SPUTUM ASSESSMENT W GRAM STAIN, RFLX TO RESP C

## 2021-08-17 LAB — PROCALCITONIN: Procalcitonin: 0.22 ng/mL

## 2021-08-17 LAB — LIPASE, BLOOD: Lipase: 24 U/L (ref 11–51)

## 2021-08-17 IMAGING — DX DG HIP (WITH OR WITHOUT PELVIS) 2-3V*R*
3 series · 3 of 3 positions shown · non-contrast
Comparison: None.

CLINICAL DATA: Posterior right hip pain x 4-6 months.

EXAM:
DG HIP (WITH OR WITHOUT PELVIS) 2-3V RIGHT

[pelvis ap]
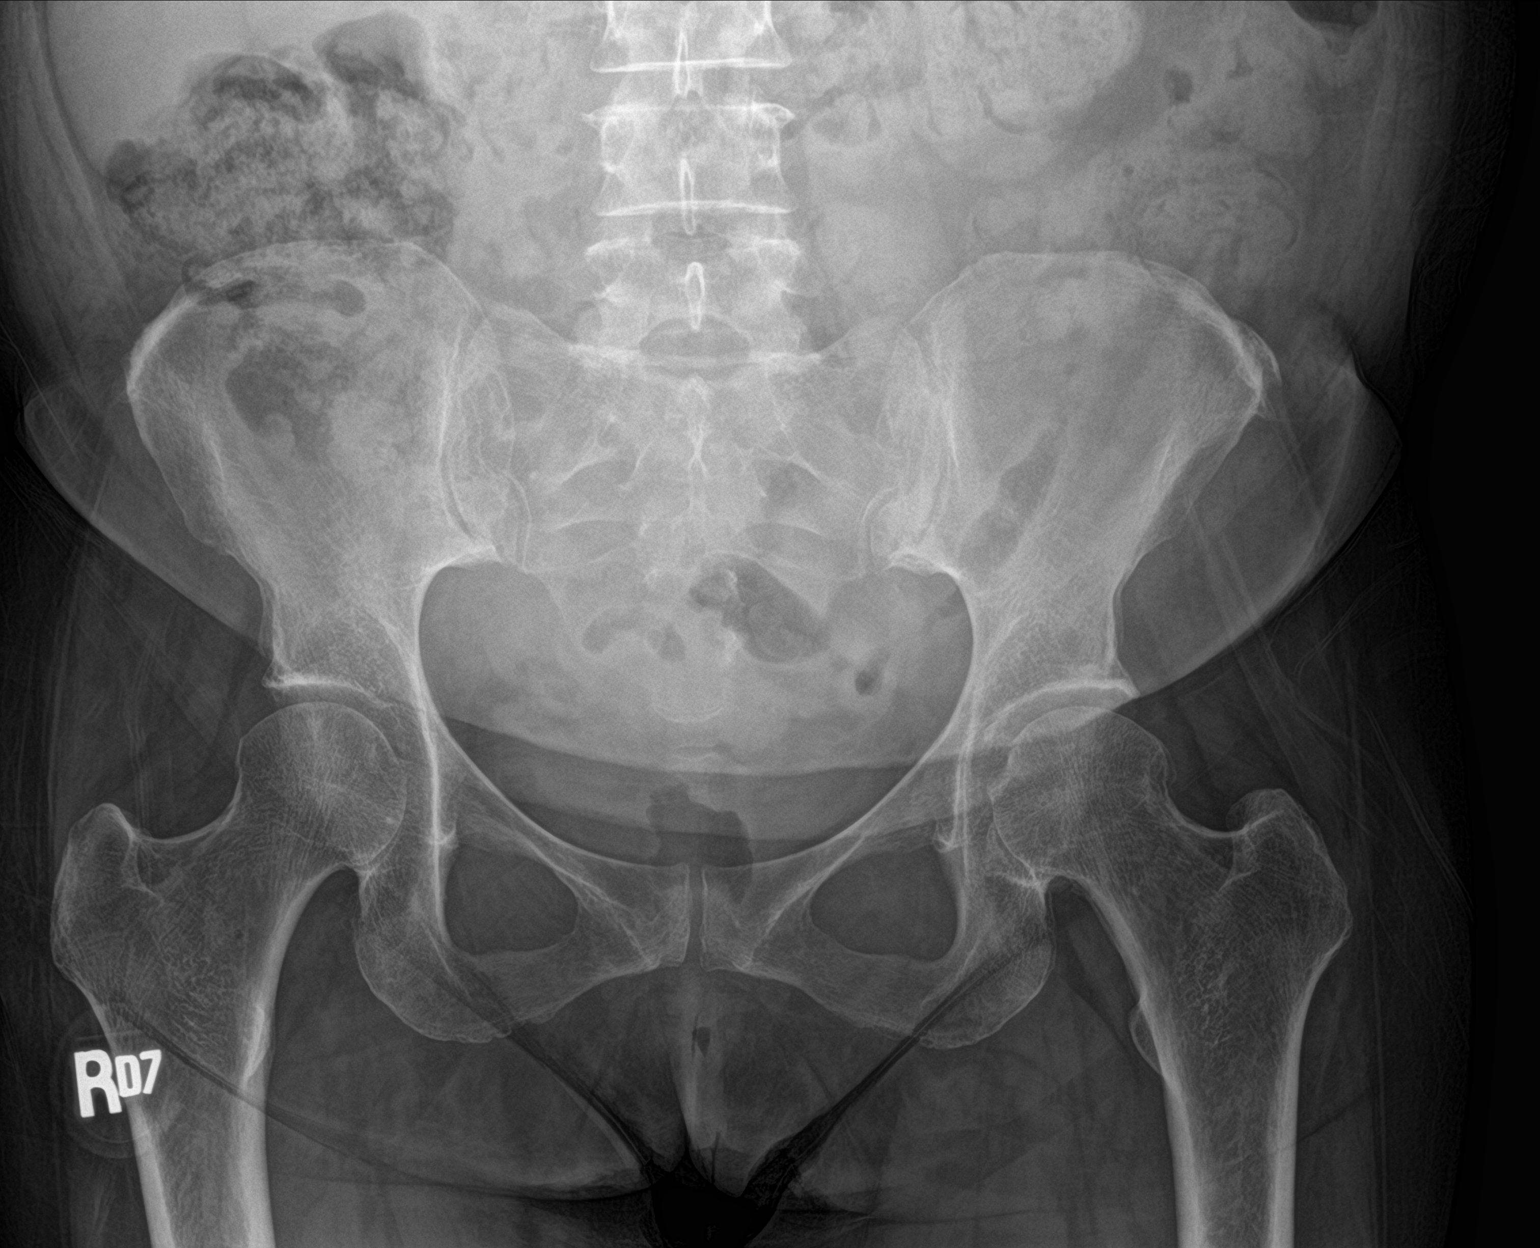

[hip ap]
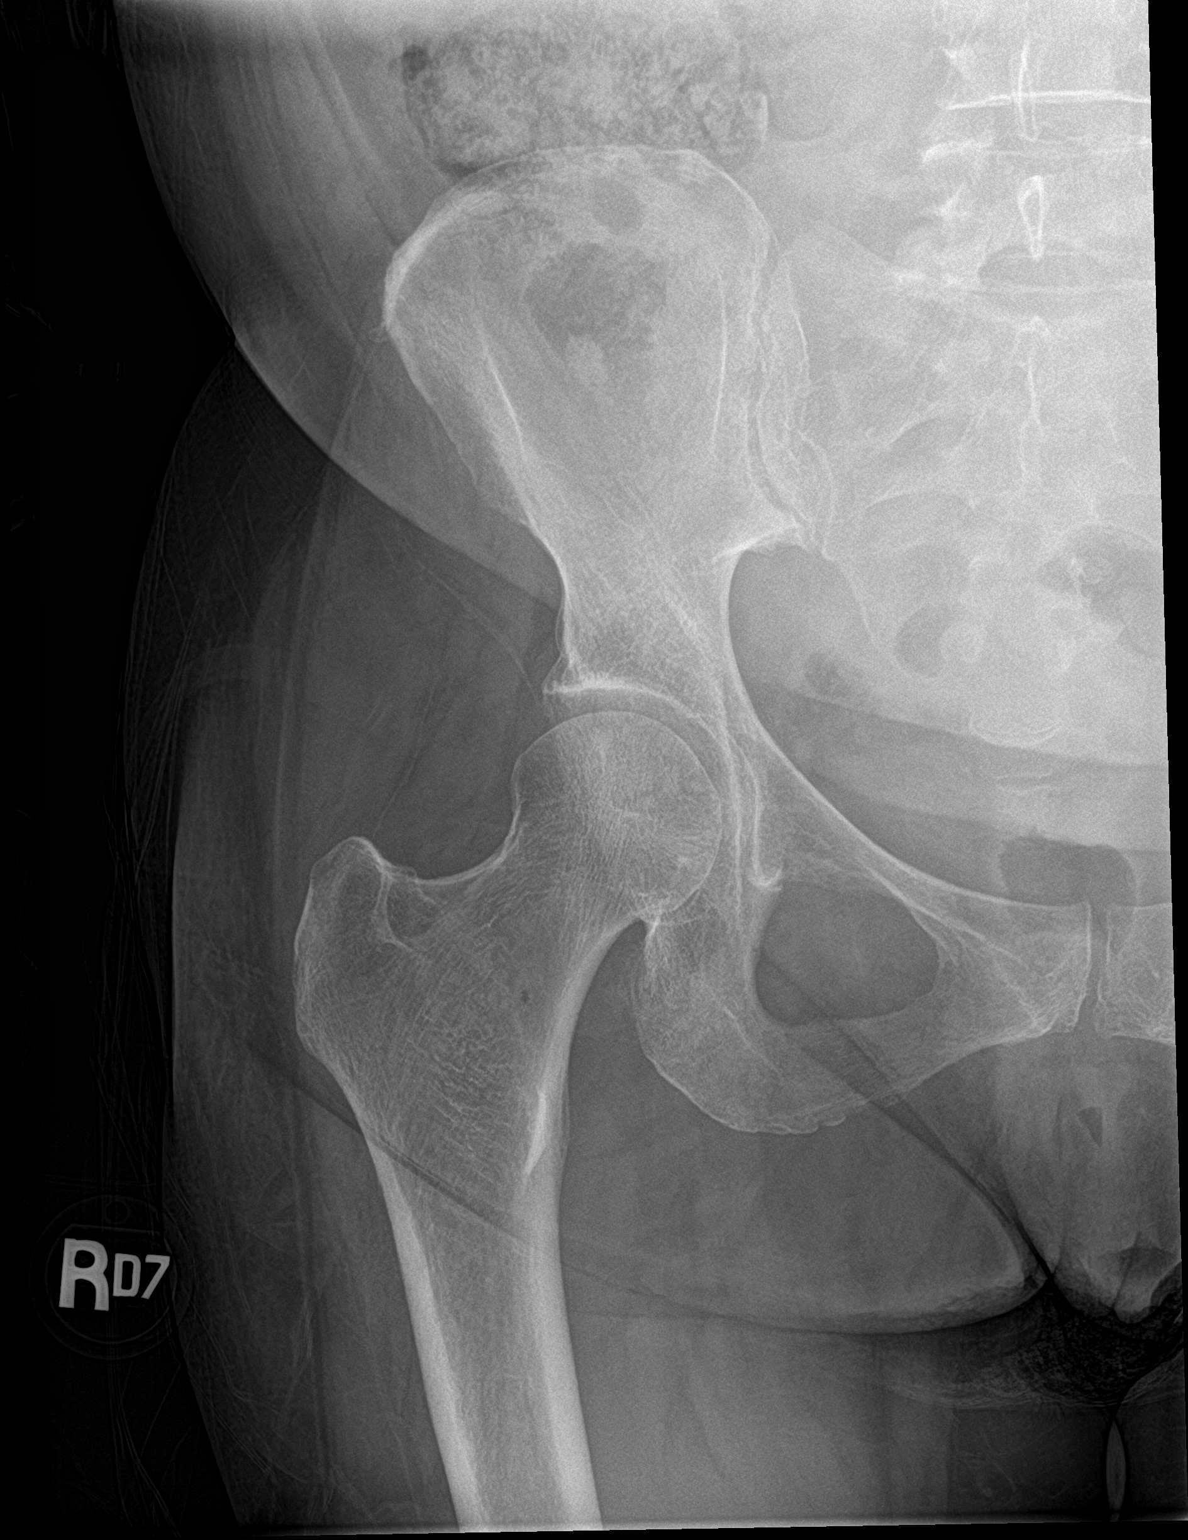

[hip frog leg]
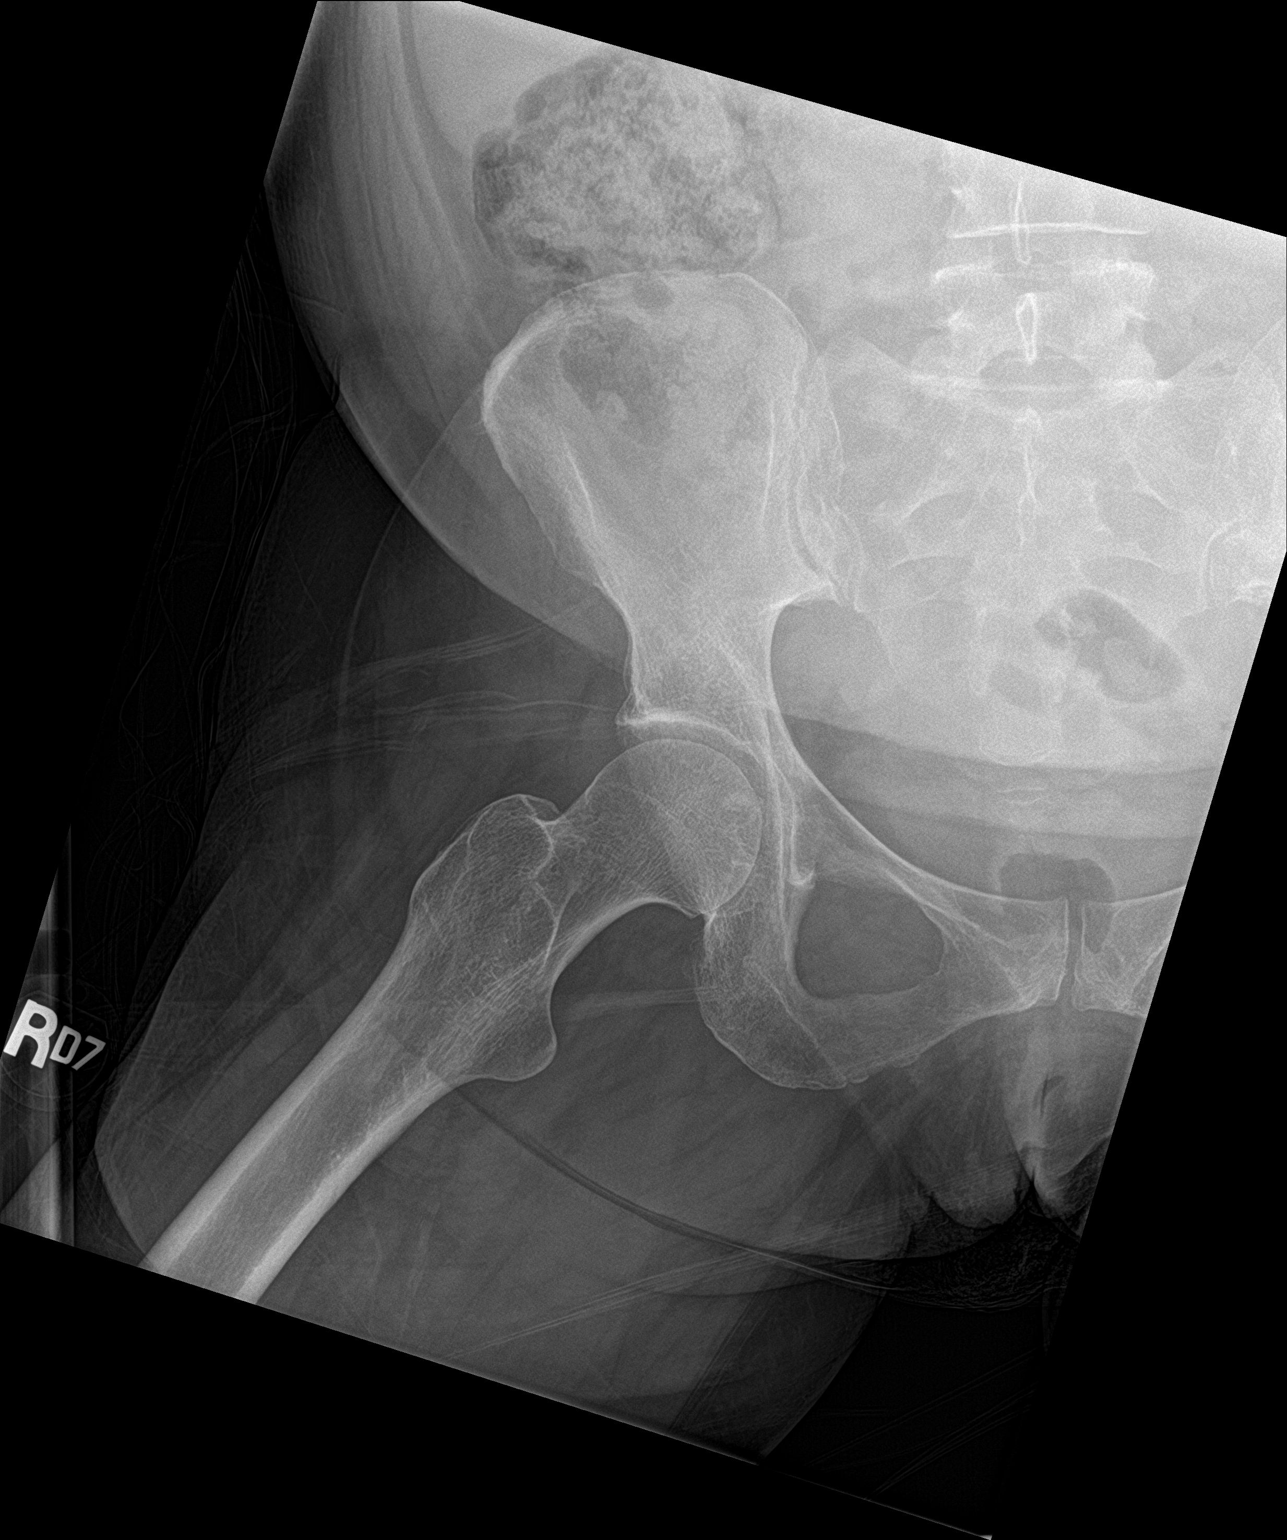

[3 of 3 positions shown; findings below may reference images not displayed]

FINDINGS: There is no evidence of hip fracture or dislocation. There is no
evidence of arthropathy or other focal bone abnormality.
IMPRESSION: Negative.

## 2021-08-17 IMAGING — DX DG KNEE COMPLETE 4+V*R*
4 series · 4 of 4 positions shown · non-contrast
Comparison: None.

CLINICAL DATA: Right posterior patellar knee pain for several
months.

EXAM:
RIGHT KNEE - COMPLETE 4+ VIEW

[knee ap]
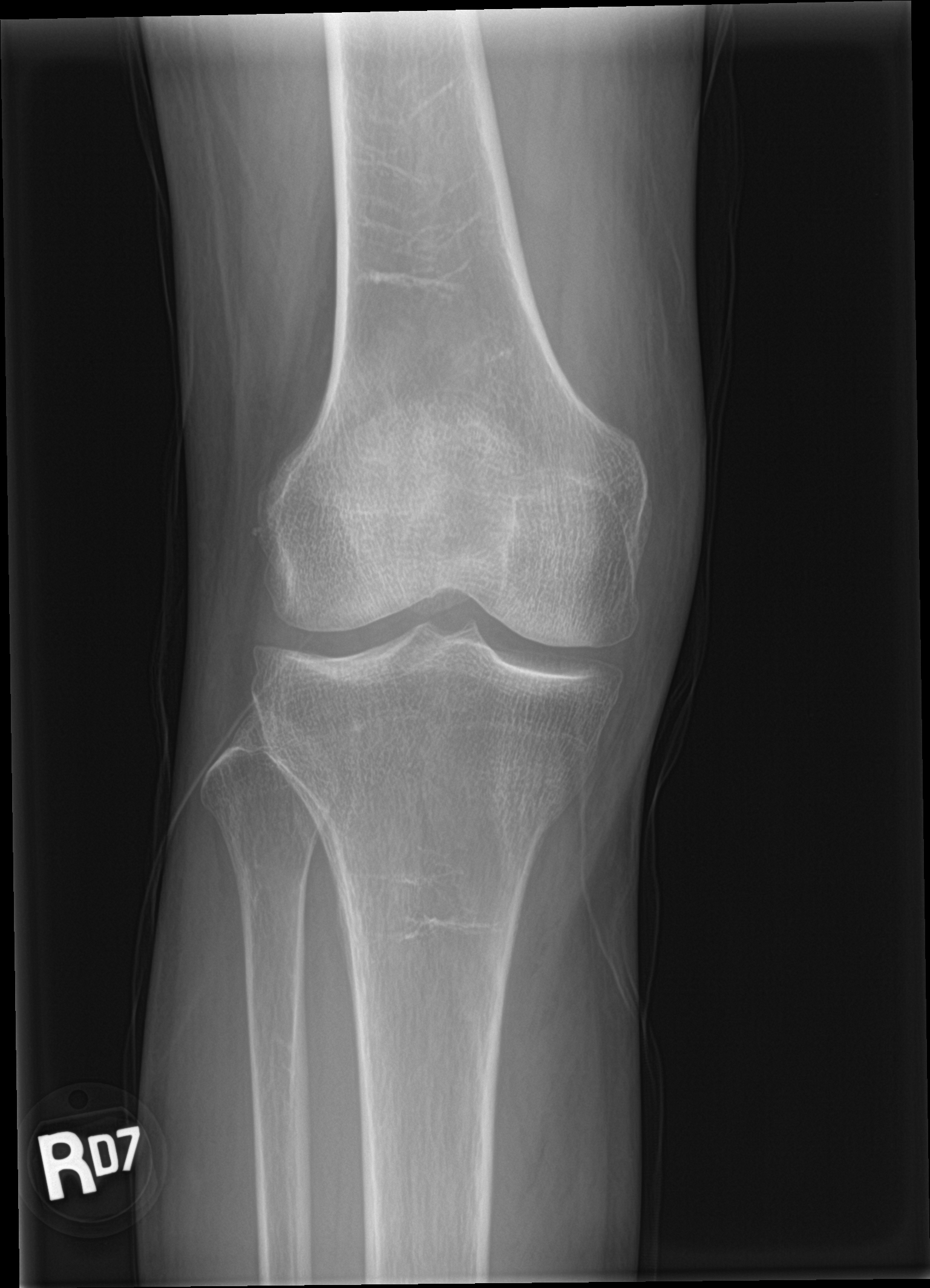

[knee obl (1 of 2)]
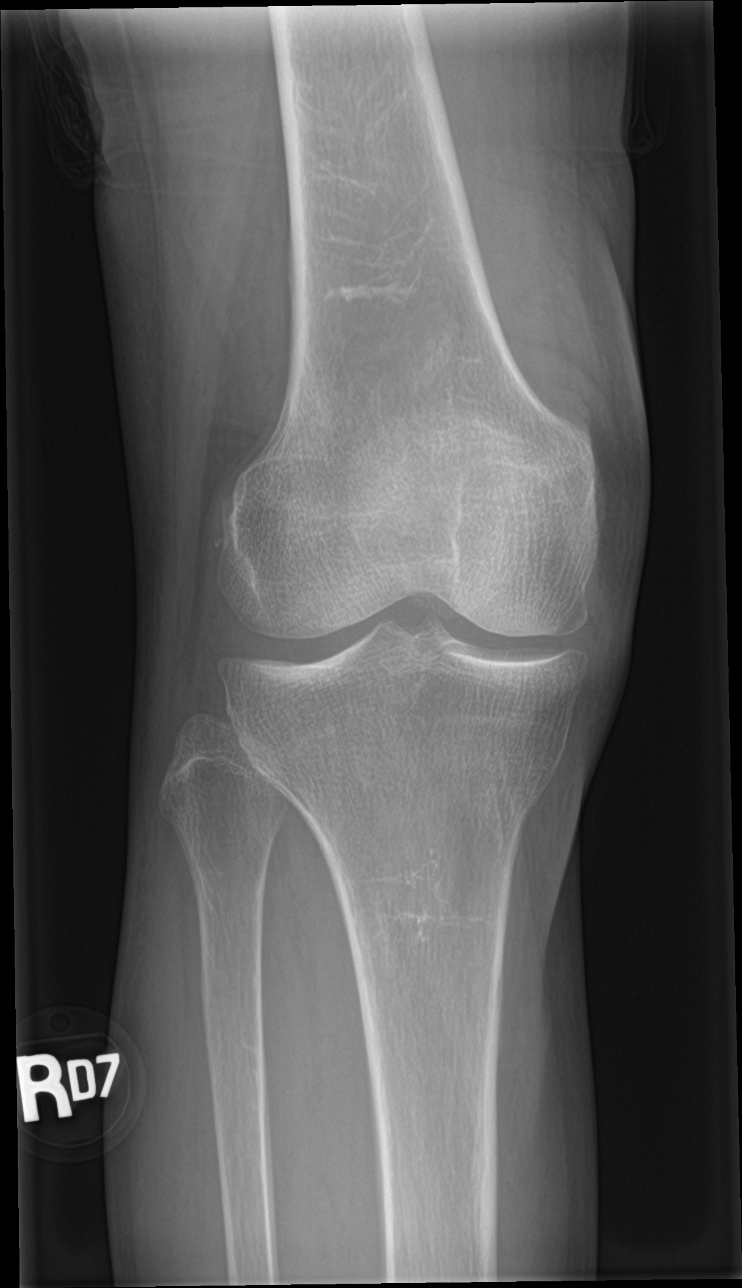

[knee obl (2 of 2)]
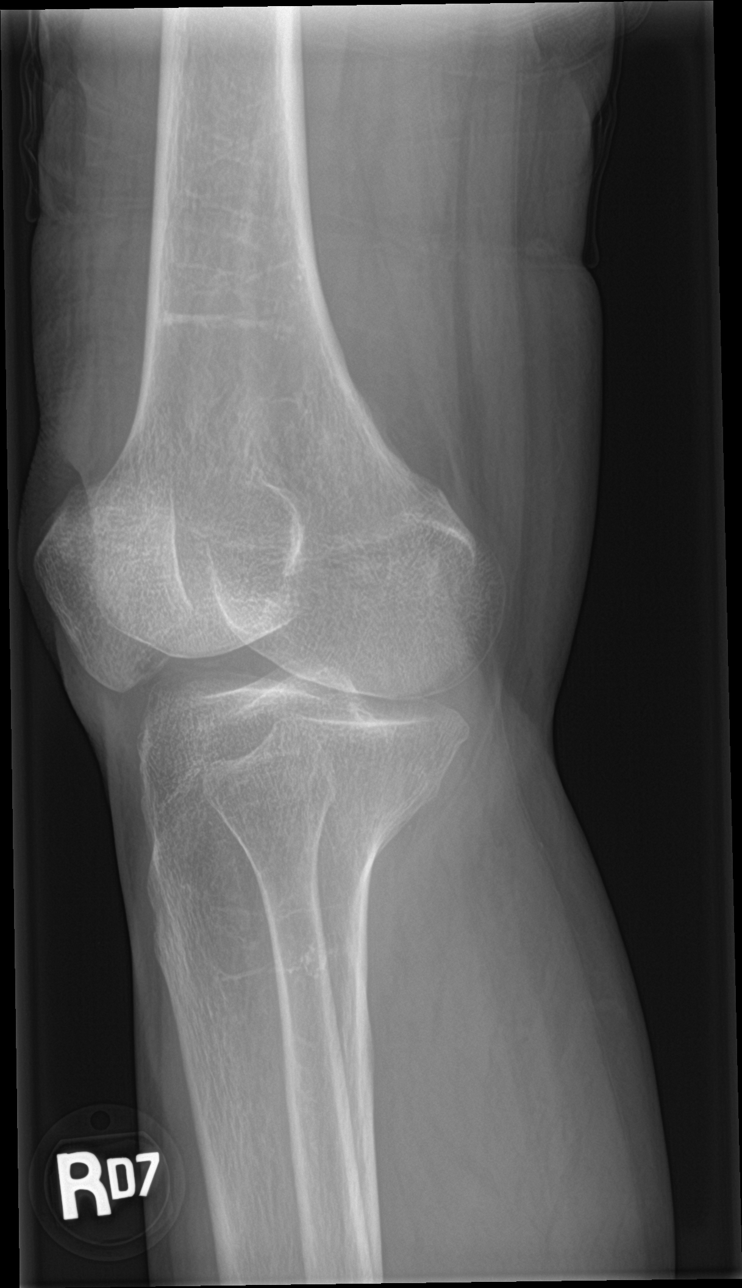

[knee lat]
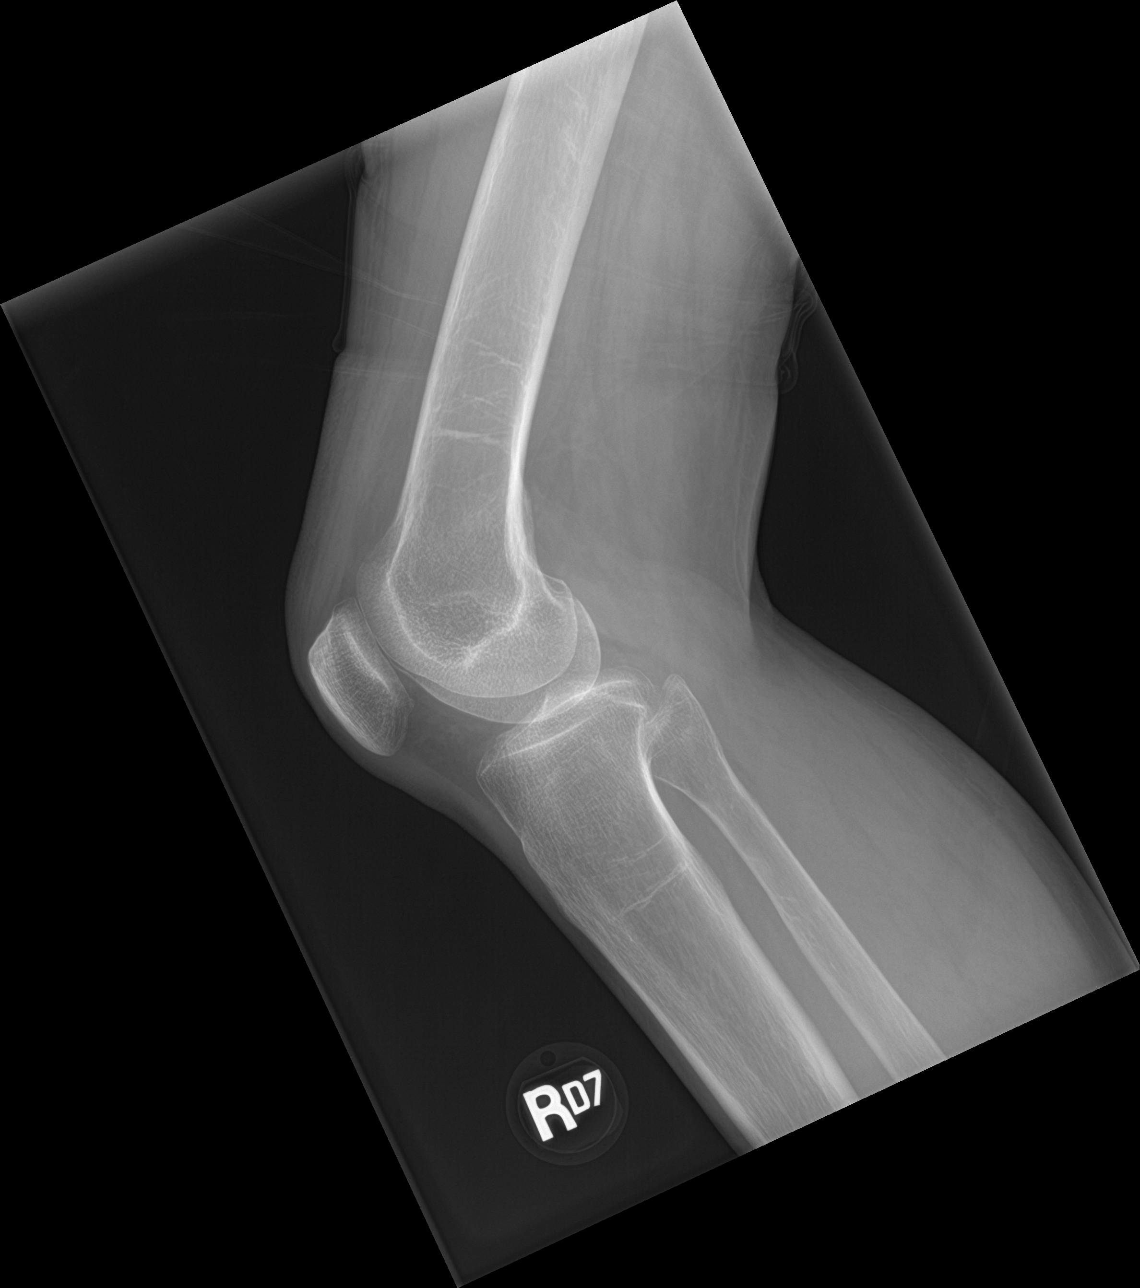

[4 of 4 positions shown; findings below may reference images not displayed]

FINDINGS: No evidence of an acute fracture, dislocation, or joint effusion. No
evidence of arthropathy. Multiple thin linear sclerotic areas are
seen within the medullary portions of the distal right femoral shaft
and proximal right tibia. Soft tissues are unremarkable.
IMPRESSION: 1. No acute osseous abnormality.
2. Multiple thin linear sclerotic areas within the distal right
femur and proximal right tibia, which may represent bone infarcts.

## 2021-08-17 MED ORDER — IPRATROPIUM-ALBUTEROL 0.5-2.5 (3) MG/3ML IN SOLN: 3.0000 mL | Freq: Four times a day (QID) | RESPIRATORY_TRACT | Status: DC

## 2021-08-17 MED ORDER — ONDANSETRON HCL 4 MG PO TABS: 4.0000 mg | ORAL_TABLET | Freq: Four times a day (QID) | ORAL | Status: DC | PRN

## 2021-08-17 MED ORDER — INSULIN ASPART 100 UNIT/ML IJ SOLN
0.0000 [IU] | Freq: Every day | INTRAMUSCULAR | Status: DC
  Administered 2021-08-17: 3 [IU] via SUBCUTANEOUS
  Administered 2021-08-17: 5 [IU] via SUBCUTANEOUS

## 2021-08-17 MED ORDER — ONDANSETRON 4 MG PO TBDP: 4.0000 mg | ORAL_TABLET | Freq: Three times a day (TID) | ORAL | Status: DC | PRN

## 2021-08-17 MED ORDER — ACETAMINOPHEN 325 MG PO TABS
650.0000 mg | ORAL_TABLET | Freq: Four times a day (QID) | ORAL | Status: DC | PRN
  Administered 2021-08-17 – 2021-08-19 (×3): 650 mg via ORAL
  Filled 2021-08-17 (×3): qty 2

## 2021-08-17 MED ORDER — LORATADINE 10 MG PO TABS
10.0000 mg | ORAL_TABLET | Freq: Every day | ORAL | Status: DC
  Administered 2021-08-17 – 2021-08-19 (×3): 10 mg via ORAL
  Filled 2021-08-17 (×3): qty 1

## 2021-08-17 MED ORDER — ROSUVASTATIN CALCIUM 20 MG PO TABS
20.0000 mg | ORAL_TABLET | Freq: Every day | ORAL | Status: DC
  Administered 2021-08-17 – 2021-08-19 (×3): 20 mg via ORAL
  Filled 2021-08-17 (×3): qty 1

## 2021-08-17 MED ORDER — IOHEXOL 9 MG/ML PO SOLN
500.0000 mL | ORAL | Status: AC
  Administered 2021-08-17 (×2): 500 mL via ORAL

## 2021-08-17 MED ORDER — ONDANSETRON HCL 4 MG/2ML IJ SOLN: 4.0000 mg | Freq: Four times a day (QID) | INTRAMUSCULAR | Status: DC | PRN

## 2021-08-17 MED ORDER — METHYLPREDNISOLONE SODIUM SUCC 40 MG IJ SOLR
40.0000 mg | Freq: Two times a day (BID) | INTRAMUSCULAR | Status: DC
  Administered 2021-08-17 – 2021-08-19 (×5): 40 mg via INTRAVENOUS
  Filled 2021-08-17 (×4): qty 1

## 2021-08-17 MED ORDER — INSULIN GLARGINE 100 UNIT/ML SOLOSTAR PEN: 15.0000 [IU] | PEN_INJECTOR | Freq: Every day | SUBCUTANEOUS | Status: DC

## 2021-08-17 MED ORDER — INSULIN ASPART 100 UNIT/ML IJ SOLN
0.0000 [IU] | Freq: Three times a day (TID) | INTRAMUSCULAR | Status: DC
  Administered 2021-08-17: 15 [IU] via SUBCUTANEOUS
  Administered 2021-08-17: 11 [IU] via SUBCUTANEOUS
  Administered 2021-08-17: 4 [IU] via SUBCUTANEOUS
  Administered 2021-08-18: 20 [IU] via SUBCUTANEOUS
  Administered 2021-08-18: 11 [IU] via SUBCUTANEOUS
  Administered 2021-08-19: 7 [IU] via SUBCUTANEOUS
  Administered 2021-08-19: 15 [IU] via SUBCUTANEOUS

## 2021-08-17 MED ORDER — GABAPENTIN 300 MG PO CAPS
300.0000 mg | ORAL_CAPSULE | Freq: Three times a day (TID) | ORAL | Status: DC
  Administered 2021-08-17 – 2021-08-19 (×7): 300 mg via ORAL
  Filled 2021-08-17 (×4): qty 1
  Filled 2021-08-17: qty 3
  Filled 2021-08-17 (×2): qty 1

## 2021-08-17 MED ORDER — ALBUTEROL SULFATE (2.5 MG/3ML) 0.083% IN NEBU: 2.5000 mg | INHALATION_SOLUTION | RESPIRATORY_TRACT | Status: DC | PRN

## 2021-08-17 MED ORDER — HYDROCODONE-ACETAMINOPHEN 5-325 MG PO TABS
1.0000 | ORAL_TABLET | Freq: Four times a day (QID) | ORAL | Status: DC | PRN
  Administered 2021-08-17 – 2021-08-19 (×9): 1 via ORAL
  Filled 2021-08-17 (×9): qty 1

## 2021-08-17 MED ORDER — PREDNISONE 20 MG PO TABS: 40.0000 mg | ORAL_TABLET | Freq: Every day | ORAL | Status: DC

## 2021-08-17 MED ORDER — FLUTICASONE PROPIONATE 50 MCG/ACT NA SUSP
2.0000 | Freq: Every day | NASAL | Status: DC
  Administered 2021-08-17 – 2021-08-19 (×3): 2 via NASAL
  Filled 2021-08-17: qty 16

## 2021-08-17 MED ORDER — BUDESONIDE 0.5 MG/2ML IN SUSP
0.5000 mg | Freq: Two times a day (BID) | RESPIRATORY_TRACT | Status: DC
  Administered 2021-08-17 – 2021-08-19 (×5): 0.5 mg via RESPIRATORY_TRACT
  Filled 2021-08-17 (×5): qty 2

## 2021-08-17 MED ORDER — AZITHROMYCIN 250 MG PO TABS
500.0000 mg | ORAL_TABLET | Freq: Every day | ORAL | Status: DC
  Administered 2021-08-17 – 2021-08-19 (×3): 500 mg via ORAL
  Filled 2021-08-17 (×3): qty 2

## 2021-08-17 MED ORDER — MELOXICAM 7.5 MG PO TABS
15.0000 mg | ORAL_TABLET | Freq: Every day | ORAL | Status: DC
  Administered 2021-08-17 – 2021-08-19 (×3): 15 mg via ORAL
  Filled 2021-08-17 (×3): qty 2

## 2021-08-17 MED ORDER — METHYLPREDNISOLONE SODIUM SUCC 40 MG IJ SOLR
40.0000 mg | Freq: Two times a day (BID) | INTRAMUSCULAR | Status: DC
Start: 2021-08-17 — End: 2021-08-17
  Filled 2021-08-17: qty 1

## 2021-08-17 MED ORDER — BUDESON-GLYCOPYRROL-FORMOTEROL 160-9-4.8 MCG/ACT IN AERO: 2.0000 | INHALATION_SPRAY | Freq: Two times a day (BID) | RESPIRATORY_TRACT | Status: DC

## 2021-08-17 MED ORDER — IOHEXOL 350 MG/ML SOLN
100.0000 mL | Freq: Once | INTRAVENOUS | Status: AC | PRN
  Administered 2021-08-17: 100 mL via INTRAVENOUS

## 2021-08-17 MED ORDER — PANTOPRAZOLE SODIUM 40 MG PO TBEC
40.0000 mg | DELAYED_RELEASE_TABLET | Freq: Every day | ORAL | Status: DC
  Administered 2021-08-17 – 2021-08-19 (×3): 40 mg via ORAL
  Filled 2021-08-17 (×3): qty 1

## 2021-08-17 MED ORDER — ENOXAPARIN SODIUM 40 MG/0.4ML IJ SOSY
40.0000 mg | PREFILLED_SYRINGE | Freq: Every day | INTRAMUSCULAR | Status: DC
  Administered 2021-08-17 – 2021-08-18 (×3): 40 mg via SUBCUTANEOUS
  Filled 2021-08-17 (×3): qty 0.4

## 2021-08-17 MED ORDER — INSULIN GLARGINE-YFGN 100 UNIT/ML ~~LOC~~ SOLN
15.0000 [IU] | Freq: Every day | SUBCUTANEOUS | Status: DC
  Administered 2021-08-17 – 2021-08-18 (×2): 15 [IU] via SUBCUTANEOUS
  Filled 2021-08-17 (×4): qty 0.15

## 2021-08-17 NOTE — Progress Notes (Signed)
Pt c/o pain 8/10 for chronic back requested Oxy 5 mg, which is not on the home medication list. New Order: Tylenol. Pt continues to request medications that are not on the home medication list, stating, "I need my gabapentin. I'm going through fucking withdrawals!" Will pass on to dayshift RN.

## 2021-08-17 NOTE — Progress Notes (Signed)
PROGRESS NOTE  [name]:[id number] DOB: 11/18/1966 DOA: 08/16/2021 PCP: Pcp, No  Brief History:  55 year old female with a history of COPD, chronic respiratory failure on 3 L, hypertension, chronic pain syndrome, diabetes mellitus type 2, anxiety, tobacco abuse presenting with 4-day history of shortness of breath and coughing with green sputum.  The patient had some subjective fevers and chills.  She denied any hemoptysis, headache, neck pain, hematemesis, diarrhea, dysuria, hematuria.  The patient has been having intermittent chest pain and abdominal pain.  She believes that this is from her coughing.  Upon EMS arrival, the patient was noted to have oxygen saturation of 90% on 3 L with a CBG 345.  She endorses compliance with all her medications.  She states that her chest pain is substernal in nature and sharp with coughing. In the emergency department, the patient was afebrile but tachycardic with a heart rate of 122.  She was hemodynamically stable.  Oxygen saturation was 94-98% on 3 L.  BMP was showed a sodium 130, potassium 3.7, serum creatinine 0.75.  WBC 18.3, hemoglobin 11.9, platelets 461,000.  The patient was started on ceftriaxone, azithromycin, and IV steroids.  Assessment/Plan: Sepsis  -present on admisison -Presented with tachycardia, leukocytosis, and tachypnea -Secondary to pneumonia -Lactic acid 1.0 -Continue ceftriaxone and azithromycin -Continue IV fluids -PCT 0.22  Lobar pneumonia -Personally reviewed chest x-ray--bilateral interstitial and reticulonodular opacities -Continue ceftriaxone and azithromycin  COPD exacerbation -Continue duo nebs -Add Pulmicort -Continue IV Solu-Medrol  Chest pain/elevated D-dimer -CTA chest -Cycle troponins -Personally reviewed EKG--sinus rhythm, nonspecific ST-T wave changes  Chronic pain syndrome -PMP AWare queried--patient receives monthly oxycodone #120, last given #28 on 8/26; 06/06/21   Diabetes mellitus  type 2, uncontrolled with hyperglycemia -Hemoglobin A1c -NovoLog sliding scale -Anticipate elevated CBGs secondary to steroids -Holding metformin   Essential hypertension -Holding irbesartan -monitor clinically   Abdominal pain -CT abd/pelvis -check lipase  Hyponatremia -due to volume depletion -improved with IVF    Status is: Inpatient  Remains inpatient appropriate because:Inpatient level of care appropriate due to severity of illness  Dispo: The patient is from: Home              Anticipated d/c is to: Home              Patient currently is not medically stable to d/c.   Difficult to place patient No        Family Communication:   no Family at bedside  Consultants:  none  Code Status:  FULL  DVT Prophylaxis:  [place] Lovenox   Procedures: As Listed in Progress Note Above  Antibiotics: None  RN Pressure Injury Documentation:        Subjective:  Patient complains of cp, back pain--moderate, worse with movement.  Denies vomiting, diarrhea, headache, f/c.  Has cough with green sputum.  No hemoptysis.  No dysuria. Objective: Vitals:   08/17/21 0002 08/17/21 0212 08/17/21 0405 08/17/21 0732  BP: 119/79  113/80   Pulse: (!) 105  99   Resp:   20   Temp: 98.6 F (37 C)     TempSrc: Oral     SpO2: 95% 94% 98% 94%  Weight:      Height:        Intake/Output Summary (Last 24 hours) at 08/17/2021 0837 Last data filed at 08/16/2021 2315 Gross per 24 hour  Intake 2858.64 ml  Output --  Net 2858.64 ml   Weight change:  Exam:  General:  Pt is alert, follows commands appropriately, not in acute distress HEENT: No icterus, No thrush, No neck mass, [place]/AT Cardiovascular: RRR, S1/S2, no rubs, no gallops Respiratory: bilateral rhonchi.  Bilateral exp wheeze Abdomen: Soft/+BS, upper abd tender, non distended, no guarding Extremities: No edema, No lymphangitis, No petechiae, No rashes, no synovitis   Data Reviewed: I have personally reviewed following  labs and imaging studies Basic Metabolic Panel: Recent Labs  Lab 08/16/21 1910 08/17/21 0714  NA 130* 136  K 3.7 4.0  CL 85* 94*  CO2 36* 35*  GLUCOSE 330* 335*  BUN 19 15  CREATININE 0.75 0.48  CALCIUM 8.8* 8.7*   Liver Function Tests: Recent Labs  Lab 08/17/21 0714  AST 31  ALT 23  ALKPHOS 104  BILITOT 0.0*  PROT 7.3  ALBUMIN 2.5*   No results for input(s): LIPASE, AMYLASE in the last 168 hours. No results for input(s): AMMONIA in the last 168 hours. Coagulation Profile: No results for input(s): INR, PROTIME in the last 168 hours. CBC: Recent Labs  Lab 08/16/21 1910 08/17/21 0558  WBC 18.3* 10.4  HGB 11.9* 10.2*  HCT 38.4 33.9*  MCV 86.3 86.7  PLT 461* 388   Cardiac Enzymes: No results for input(s): CKTOTAL, CKMB, CKMBINDEX, TROPONINI in the last 168 hours. BNP: Invalid input(s): POCBNP CBG: Recent Labs  Lab 08/17/21 0027 08/17/21 0742  GLUCAP 259* 342*   HbA1C: No results for input(s): HGBA1C in the last 72 hours. Urine analysis:    Component Value Date/Time   COLORURINE YELLOW 04/21/2020 1430   APPEARANCEUR CLEAR 04/21/2020 1430   LABSPEC 1.014 04/21/2020 1430   PHURINE 5.0 04/21/2020 1430   GLUCOSEU >=500 (A) 04/21/2020 1430   HGBUR SMALL (A) 04/21/2020 1430   BILIRUBINUR NEGATIVE 04/21/2020 1430   KETONESUR NEGATIVE 04/21/2020 1430   PROTEINUR NEGATIVE 04/21/2020 1430   UROBILINOGEN 1.0 07/24/2014 1605   NITRITE NEGATIVE 04/21/2020 1430   LEUKOCYTESUR NEGATIVE 04/21/2020 1430   Sepsis Labs: @LABRCNTIP (procalcitonin:4,lacticidven:4) ) Recent Results (from the past 240 hour(s))  Resp Panel by RT-PCR (Flu A&B, Covid) Nasopharyngeal Swab     Status: None   Collection Time: 08/16/21  7:10 PM   Specimen: Nasopharyngeal Swab; Nasopharyngeal(NP) swabs in vial transport medium  Result Value Ref Range Status   SARS Coronavirus 2 by RT PCR NEGATIVE NEGATIVE Final    Comment: (NOTE) SARS-CoV-2 target nucleic acids are NOT DETECTED.  The  SARS-CoV-2 RNA is generally detectable in upper respiratory specimens during the acute phase of infection. The lowest concentration of SARS-CoV-2 viral copies this assay can detect is 138 copies/mL. A negative result does not preclude SARS-Cov-2 infection and should not be used as the sole basis for treatment or other patient management decisions. A negative result may occur with  improper specimen collection/handling, submission of specimen other than nasopharyngeal swab, presence of viral mutation(s) within the areas targeted by this assay, and inadequate number of viral copies(<138 copies/mL). A negative result must be combined with clinical observations, patient history, and epidemiological information. The expected result is Negative.  Fact Sheet for Patients:  08/18/21  Fact Sheet for Healthcare Providers:  BloggerCourse.com  This test is no t yet approved or cleared by the [place] FDA and  has been authorized for detection and/or diagnosis of SARS-CoV-2 by FDA under an Emergency Use Authorization (EUA). This EUA will remain  in effect (meaning this test can be used) for the duration of the COVID-19 declaration under Section 564(b)(1) of the Act, 21  U.S.C.section 360bbb-3(b)(1), unless the authorization is terminated  or revoked sooner.       Influenza A by PCR NEGATIVE NEGATIVE Final   Influenza B by PCR NEGATIVE NEGATIVE Final    Comment: (NOTE) The Xpert Xpress SARS-CoV-2/FLU/RSV plus assay is intended as an aid in the diagnosis of influenza from Nasopharyngeal swab specimens and should not be used as a sole basis for treatment. Nasal washings and aspirates are unacceptable for Xpert Xpress SARS-CoV-2/FLU/RSV testing.  Fact Sheet for Patients: BloggerCourse.com  Fact Sheet for Healthcare Providers: SeriousBroker.it  This test is not yet approved or  cleared by the [place] FDA and has been authorized for detection and/or diagnosis of SARS-CoV-2 by FDA under an Emergency Use Authorization (EUA). This EUA will remain in effect (meaning this test can be used) for the duration of the COVID-19 declaration under Section 564(b)(1) of the Act, 21 U.S.C. section 360bbb-3(b)(1), unless the authorization is terminated or revoked.  Performed at [facility], [address], [place], [place] [address]   Expectorated Sputum Assessment w Gram Stain, Rflx to Resp Cult     Status: None   Collection Time: 08/17/21  2:36 AM   Specimen: Sputum  Result Value Ref Range Status   Specimen Description SPUTUM  Final   Special Requests NONE  Final   Sputum evaluation   Final    THIS SPECIMEN IS ACCEPTABLE FOR SPUTUM CULTURE Performed at [facility], [address] [place], [place] [address]    Report Status 08/17/2021 FINAL  Final     Scheduled Meds:  azithromycin  500 mg Oral Daily   budesonide (PULMICORT) nebulizer solution  0.5 mg Nebulization BID   enoxaparin (LOVENOX) injection  40 mg Subcutaneous QHS   fluticasone  2 spray Each Nare Daily   gabapentin  300 mg Oral TID   insulin aspart  0-20 Units Subcutaneous TID WC   insulin aspart  0-5 Units Subcutaneous QHS   insulin glargine-yfgn  15 Units Subcutaneous Daily   ipratropium-albuterol  3 mL Nebulization Q6H   meloxicam  15 mg Oral Daily   methylPREDNISolone (SOLU-MEDROL) injection  40 mg Intravenous Q12H   pantoprazole  40 mg Oral Daily   Continuous Infusions:  cefTRIAXone (ROCEPHIN)  IV Stopped (08/16/21 2213)    Procedures/Studies: DG Chest Port 1 View  Result Date: 08/16/2021 CLINICAL DATA:  Cough, shortness of breath EXAM: PORTABLE CHEST 1 VIEW COMPARISON:  March 07, 2021 FINDINGS: The cardiomediastinal silhouette is unchanged in contour. No pleural effusion. No pneumothorax. Increased diffuse bilateral reticulonodular opacities in a mildly peripheral predominance. Peribronchial  cuffing. Visualized abdomen is unremarkable. IMPRESSION: Increased diffuse bilateral reticulonodular opacities in a peripheral predominant pattern. Findings likely are infectious in etiology. Electronically Signed   By: Meda Klinefelter M.D.   On: 08/16/2021 19:44    Catarina Hartshorn, DO  [facility]  If 7PM-7AM, please contact night-coverage [email or web address] Password [facility] 08/17/2021, 8:37 AM   LOS: 1 day

## 2021-08-17 NOTE — Progress Notes (Signed)
Pt welcomed and oriented to unit. Ambulated from wheelchair to bed without difficulty. Pt 95% O2 on 3L [place]. All other VS stable.

## 2021-08-17 NOTE — Progress Notes (Signed)
Day Shift MD made aware of patient stating she is withdrawing from Gabapentin. Gabapentin is not listed on home medication list.

## 2021-08-18 ENCOUNTER — Inpatient Hospital Stay (HOSPITAL_COMMUNITY): Payer: Medicaid Other

## 2021-08-18 DIAGNOSIS — F17210 Nicotine dependence, cigarettes, uncomplicated: Secondary | ICD-10-CM | POA: Diagnosis not present

## 2021-08-18 DIAGNOSIS — J9621 Acute and chronic respiratory failure with hypoxia: Secondary | ICD-10-CM | POA: Diagnosis not present

## 2021-08-18 DIAGNOSIS — G894 Chronic pain syndrome: Secondary | ICD-10-CM | POA: Diagnosis not present

## 2021-08-18 DIAGNOSIS — J181 Lobar pneumonia, unspecified organism: Secondary | ICD-10-CM | POA: Diagnosis not present

## 2021-08-18 LAB — GLUCOSE, CAPILLARY
Glucose-Capillary: 363 mg/dL — ABNORMAL HIGH (ref 70–99)
Glucose-Capillary: 80 mg/dL (ref 70–99)
Glucose-Capillary: 286 mg/dL — ABNORMAL HIGH (ref 70–99)
Glucose-Capillary: 196 mg/dL — ABNORMAL HIGH (ref 70–99)

## 2021-08-18 LAB — HEPATIC FUNCTION PANEL
ALT: 19 U/L (ref 0–44)
AST: 18 U/L (ref 15–41)
Albumin: 2.4 g/dL — ABNORMAL LOW (ref 3.5–5.0)
Alkaline Phosphatase: 89 U/L (ref 38–126)
Bilirubin, Direct: <0.1 mg/dL (ref 0.0–0.2)
Total Bilirubin: <0.1 mg/dL — ABNORMAL LOW (ref 0.3–1.2)
Total Protein: 6.5 g/dL (ref 6.5–8.1)

## 2021-08-18 LAB — BASIC METABOLIC PANEL
Anion gap: 7 (ref 5–15)
BUN: 20 mg/dL (ref 6–20)
CO2: 35 mmol/L — ABNORMAL HIGH (ref 22–32)
Calcium: 8.7 mg/dL — ABNORMAL LOW (ref 8.9–10.3)
Chloride: 90 mmol/L — ABNORMAL LOW (ref 98–111)
Creatinine, Ser: 0.63 mg/dL (ref 0.44–1.00)
GFR, Estimated: >60 mL/min (ref >60)
Glucose, Bld: 419 mg/dL — ABNORMAL HIGH (ref 70–99)
Potassium: 3.8 mmol/L (ref 3.5–5.1)
Sodium: 132 mmol/L — ABNORMAL LOW (ref 135–145)

## 2021-08-18 LAB — CBC
HCT: 29 % — ABNORMAL LOW (ref 36.0–46.0)
Hemoglobin: 8.9 g/dL — ABNORMAL LOW (ref 12.0–15.0)
MCH: 26.4 pg (ref 26.0–34.0)
MCHC: 30.7 g/dL (ref 30.0–36.0)
MCV: 86.1 fL (ref 80.0–100.0)
Platelets: 397 10*3/uL (ref 150–400)
RBC: 3.37 MIL/uL — ABNORMAL LOW (ref 3.87–5.11)
RDW: 13.3 % (ref 11.5–15.5)
WBC: 20.3 10*3/uL — ABNORMAL HIGH (ref 4.0–10.5)
nRBC: 0 % (ref 0.0–0.2)

## 2021-08-18 LAB — MAGNESIUM: Magnesium: 1.9 mg/dL (ref 1.7–2.4)

## 2021-08-18 MED ORDER — TRAZODONE HCL 50 MG PO TABS
25.0000 mg | ORAL_TABLET | Freq: Once | ORAL | Status: AC
  Administered 2021-08-18: 25 mg via ORAL
  Filled 2021-08-18: qty 1

## 2021-08-18 MED ORDER — INFLUENZA VAC SPLIT QUAD 0.5 ML IM SUSY
0.5000 mL | PREFILLED_SYRINGE | INTRAMUSCULAR | Status: AC
  Administered 2021-08-19: 0.5 mL via INTRAMUSCULAR
  Filled 2021-08-18: qty 0.5

## 2021-08-18 MED ORDER — INSULIN GLARGINE-YFGN 100 UNIT/ML ~~LOC~~ SOLN
20.0000 [IU] | Freq: Every day | SUBCUTANEOUS | Status: DC
  Administered 2021-08-19: 20 [IU] via SUBCUTANEOUS
  Filled 2021-08-18 (×3): qty 0.2

## 2021-08-18 NOTE — Progress Notes (Signed)
Inpatient Diabetes Program Recommendations  AACE/ADA: New Consensus Statement on Inpatient Glycemic Control (2015)  Target Ranges:  Prepandial:   less than 140 mg/dL      Peak postprandial:   less than 180 mg/dL (1-2 hours)      Critically ill patients:  140 - 180 mg/dL   Lab Results  Component Value Date   GLUCAP 363 (H) 08/18/2021   HGBA1C 7.6 (H) 08/17/2021    Review of Glycemic Control Results for [name], [name] (MRN [id number]) as of 08/18/2021 10:24  Ref. Range 08/17/2021 07:42 08/17/2021 11:05 08/17/2021 16:10 08/17/2021 20:25 08/18/2021 07:54  Glucose-Capillary Latest Ref Range: 70 - 99 mg/dL 741 (H) 423 (H) 953 (H) 371 (H) 363 (H)   Diabetes history: DM2 Outpatient Diabetes medications: Lantus 15 units, Metformin 1 gm bid Current orders for Inpatient glycemic control: Semglee 15 units, Novolog correction 0-20 units tid + hs 0-5 units, Solumedrol 40 mg q 12 hrs.  Inpatient Diabetes Program Recommendations:   -Consider while on steroids: -Increase Novolog correction to q 4 hrs. -Add meal coverage as needed  Thank you, Darel Hong E. Tiari Andringa, RN, MSN, CDE  Diabetes Coordinator Inpatient Glycemic Control Team Team Pager [phone] (8am-5pm) 08/18/2021 10:27 AM

## 2021-08-18 NOTE — Progress Notes (Signed)
PROGRESS NOTE  [name]:[id number] DOB: [birth year] DOA: 08/16/2021 PCP: Pcp, No  Brief History:  55 year old female with a history of COPD, chronic respiratory failure on 3 L, hypertension, chronic pain syndrome, diabetes mellitus type 2, anxiety, tobacco abuse presenting with 4-day history of shortness of breath and coughing with green sputum.  The patient had some subjective fevers and chills.  She denied any hemoptysis, headache, neck pain, hematemesis, diarrhea, dysuria, hematuria.  The patient has been having intermittent chest pain and abdominal pain.  She believes that this is from her coughing.  Upon EMS arrival, the patient was noted to have oxygen saturation of 90% on 3 L with a CBG 345.  She endorses compliance with all her medications.  She states that her chest pain is substernal in nature and sharp with coughing. In the emergency department, the patient was afebrile but tachycardic with a heart rate of 122.  She was hemodynamically stable.  Oxygen saturation was 94-98% on 3 L.  BMP was showed a sodium 130, potassium 3.7, serum creatinine 0.75.  WBC 18.3, hemoglobin 11.9, platelets 461,000.  The patient was started on ceftriaxone, azithromycin, and IV steroids.   Assessment/Plan: Sepsis  -present on admisison -Presented with tachycardia, leukocytosis, and tachypnea -Secondary to pneumonia -Lactic acid 1.0 -Continue ceftriaxone and azithromycin -Continue IV fluids -PCT 0.22   Lobar pneumonia -Personally reviewed chest x-ray--bilateral interstitial and reticulonodular opacities -Continue ceftriaxone and azithromycin   COPD exacerbation -Continue duo nebs -continue Pulmicort -Continue IV Solu-Medrol   Chest pain/elevated D-dimer -CTA chest--no PE; numerous tree-in-bud nodularity;  more focal consolidation LUL and RML -Cycle troponins4>>3 -Personally reviewed EKG--sinus rhythm, nonspecific ST-T wave changes   Chronic pain syndrome -PMP AWare  queried--patient receives monthly oxycodone #120, last given #28 on 8/26; 06/06/21   Diabetes mellitus type 2, uncontrolled with hyperglycemia -9/25 Hemoglobin A1c 7.6 -NovoLog sliding scale -Anticipate elevated CBGs secondary to steroids -Holding metformin -increase Semglee to 20 units   Essential hypertension -Holding irbesartan -monitor clinically   Abdominal pain -9/25 CT abd/pelvis--moderate intrahepatic  -check lipase--24   Hyponatremia -due to volume depletion -improved with IVF       Status is: Inpatient   Remains inpatient appropriate because:Inpatient level of care appropriate due to severity of illness   Dispo: The patient is from: Home              Anticipated d/c is to: Home              Patient currently is not medically stable to d/c.              Difficult to place patient No               Family Communication:   no Family at bedside   Consultants:  none   Code Status:  FULL  DVT Prophylaxis:  [place] Lovenox     Procedures: As Listed in Progress Note Above   Antibiotics: Ceftriaxone 9/24>> Azithro 9/24>>       Subjective: Patient complains of pain all over.  She continues to have mild to mod chest pain and abd pain.  No n/v/d.  She is breathing better.  No dysuria  Objective: Vitals:   08/18/21 0141 08/18/21 0411 08/18/21 0937 08/18/21 1346  BP:  121/81  117/65  Pulse:  (!) 102  87  Resp:  18  16  Temp:  97.8 F (36.6 C)  97.9 F (36.6 C)  TempSrc:  Oral  Oral  SpO2: 97% 99% 96% 99%  Weight:      Height:        Intake/Output Summary (Last 24 hours) at 08/18/2021 1418 Last data filed at 08/17/2021 2300 Gross per 24 hour  Intake 480 ml  Output --  Net 480 ml   Weight change:  Exam:  General:  Pt is alert, follows commands appropriately, not in acute distress HEENT: No icterus, No thrush, No neck mass, [place]/AT Cardiovascular: RRR, S1/S2, no rubs, no gallops Respiratory: scattered bilateral rales.  Bibasilar wheeze Abdomen:  Soft/+BS, non tender, non distended, no guarding Extremities: No edema, No lymphangitis, No petechiae, No rashes, no synovitis   Data Reviewed: I have personally reviewed following labs and imaging studies Basic Metabolic Panel: Recent Labs  Lab 08/16/21 1910 08/17/21 0714 08/18/21 0623  NA 130* 136 132*  K 3.7 4.0 3.8  CL 85* 94* 90*  CO2 36* 35* 35*  GLUCOSE 330* 335* 419*  BUN CREATININE 0.75 0.48 0.63  CALCIUM 8.8* 8.7* 8.7*  MG  --   --  1.9   Liver Function Tests: Recent Labs  Lab 08/17/21 0714 08/18/21 0623  AST 31 18  ALT 23 19  ALKPHOS 104 89  BILITOT 0.0* <0.1*  PROT 7.3 6.5  ALBUMIN 2.5* 2.4*   Recent Labs  Lab 08/17/21 0919  LIPASE 24   No results for input(s): AMMONIA in the last 168 hours. Coagulation Profile: No results for input(s): INR, PROTIME in the last 168 hours. CBC: Recent Labs  Lab 08/16/21 1910 08/17/21 0558 08/18/21 0623  WBC 18.3* 10.4 20.3*  HGB 11.9* 10.2* 8.9*  HCT 38.4 33.9* 29.0*  MCV 86.3 86.7 86.1  PLT 461* 388 397   Cardiac Enzymes: No results for input(s): CKTOTAL, CKMB, CKMBINDEX, TROPONINI in the last 168 hours. BNP: Invalid input(s): POCBNP CBG: Recent Labs  Lab 08/17/21 1105 08/17/21 1610 08/17/21 2025 08/18/21 0754 08/18/21 1151  GLUCAP 260* 171* 371* 363* 80   HbA1C: Recent Labs    08/17/21 0558  HGBA1C 7.6*   Urine analysis:    Component Value Date/Time   COLORURINE YELLOW 04/21/2020 1430   APPEARANCEUR CLEAR 04/21/2020 1430   LABSPEC 1.014 04/21/2020 1430   PHURINE 5.0 04/21/2020 1430   GLUCOSEU >=500 (A) 04/21/2020 1430   HGBUR SMALL (A) 04/21/2020 1430   BILIRUBINUR NEGATIVE 04/21/2020 1430   KETONESUR NEGATIVE 04/21/2020 1430   PROTEINUR NEGATIVE 04/21/2020 1430   UROBILINOGEN 1.0 07/24/2014 1605   NITRITE NEGATIVE 04/21/2020 1430   LEUKOCYTESUR NEGATIVE 04/21/2020 1430   Sepsis Labs: (procalcitonin:4,lacticidven:4) ) Recent Results (from the past 240  hour(s))  Resp Panel by RT-PCR (Flu A&B, Covid) Nasopharyngeal Swab     Status: None   Collection Time: 08/16/21  7:10 PM   Specimen: Nasopharyngeal Swab; Nasopharyngeal(NP) swabs in vial transport medium  Result Value Ref Range Status   SARS Coronavirus 2 by RT PCR NEGATIVE NEGATIVE Final    Comment: (NOTE) SARS-CoV-2 target nucleic acids are NOT DETECTED.  The SARS-CoV-2 RNA is generally detectable in upper respiratory specimens during the acute phase of infection. The lowest concentration of SARS-CoV-2 viral copies this assay can detect is 138 copies/mL. A negative result does not preclude SARS-Cov-2 infection and should not be used as the sole basis for treatment or other patient management decisions. A negative result may occur with  improper specimen collection/handling, submission of specimen other than nasopharyngeal swab, presence of viral mutation(s) within the areas targeted by this assay,  and inadequate number of viral copies(<138 copies/mL). A negative result must be combined with clinical observations, patient history, and epidemiological information. The expected result is Negative.  Fact Sheet for Patients:  BloggerCourse.com  Fact Sheet for Healthcare Providers:  SeriousBroker.it  This test is no t yet approved or cleared by the [place] FDA and  has been authorized for detection and/or diagnosis of SARS-CoV-2 by FDA under an Emergency Use Authorization (EUA). This EUA will remain  in effect (meaning this test can be used) for the duration of the COVID-19 declaration under Section 564(b)(1) of the Act, 21 U.S.C.section 360bbb-3(b)(1), unless the authorization is terminated  or revoked sooner.       Influenza A by PCR NEGATIVE NEGATIVE Final   Influenza B by PCR NEGATIVE NEGATIVE Final    Comment: (NOTE) The Xpert Xpress SARS-CoV-2/FLU/RSV plus assay is intended as an aid in the diagnosis of influenza from  Nasopharyngeal swab specimens and should not be used as a sole basis for treatment. Nasal washings and aspirates are unacceptable for Xpert Xpress SARS-CoV-2/FLU/RSV testing.  Fact Sheet for Patients: BloggerCourse.com  Fact Sheet for Healthcare Providers: SeriousBroker.it  This test is not yet approved or cleared by the [place] FDA and has been authorized for detection and/or diagnosis of SARS-CoV-2 by FDA under an Emergency Use Authorization (EUA). This EUA will remain in effect (meaning this test can be used) for the duration of the COVID-19 declaration under Section 564(b)(1) of the Act, 21 U.S.C. section 360bbb-3(b)(1), unless the authorization is terminated or revoked.  Performed at [facility], [address] [place], [place] [address]   Blood culture (routine x 2)     Status: None (Preliminary result)   Collection Time: 08/16/21  9:09 PM   Specimen: BLOOD RIGHT ARM  Result Value Ref Range Status   Specimen Description BLOOD RIGHT ARM  Final   Special Requests   Final    BOTTLES DRAWN AEROBIC AND ANAEROBIC Blood Culture adequate volume   Culture   Final    NO GROWTH 2 DAYS Performed at [facility], [address] [place], [place] [address]    Report Status PENDING  Incomplete  Blood culture (routine x 2)     Status: None (Preliminary result)   Collection Time: 08/16/21  9:16 PM   Specimen: BLOOD LEFT HAND  Result Value Ref Range Status   Specimen Description BLOOD LEFT HAND  Final   Special Requests   Final    BOTTLES DRAWN AEROBIC AND ANAEROBIC Blood Culture results may not be optimal due to an excessive volume of blood received in culture bottles   Culture   Final    NO GROWTH 2 DAYS Performed at [facility], [address], [place], [place] [address]    Report Status PENDING  Incomplete  Expectorated Sputum Assessment w Gram Stain, Rflx to Resp Cult     Status: None   Collection Time: 08/17/21  2:36 AM    Specimen: Sputum  Result Value Ref Range Status   Specimen Description SPUTUM  Final   Special Requests NONE  Final   Sputum evaluation   Final    THIS SPECIMEN IS ACCEPTABLE FOR SPUTUM CULTURE Performed at [facility], [address] [place], [place] [address]    Report Status 08/17/2021 FINAL  Final  Culture, Respiratory w Gram Stain     Status: None (Preliminary result)   Collection Time: 08/17/21  2:36 AM   Specimen: SPU  Result Value Ref Range Status  Specimen Description   Final    SPUTUM Performed at [facility], [address] [place], [place] [address]    Special Requests   Final    NONE Reflexed from [id number] Performed at [facility], [address], [place], [place] [address]    Gram Stain   Final    ABUNDANT WBC PRESENT, PREDOMINANTLY PMN FEW GRAM POSITIVE COCCI RARE GRAM NEGATIVE RODS    Culture   Final    CULTURE REINCUBATED FOR BETTER GROWTH Performed at [facility], [address], [place], [place] [address]    Report Status PENDING  Incomplete     Scheduled Meds:  azithromycin  500 mg Oral Daily   budesonide (PULMICORT) nebulizer solution  0.5 mg Nebulization BID   enoxaparin (LOVENOX) injection  40 mg Subcutaneous QHS   fluticasone  2 spray Each Nare Daily   gabapentin  300 mg Oral TID   insulin aspart  0-20 Units Subcutaneous TID WC   insulin aspart  0-5 Units Subcutaneous QHS   insulin glargine-yfgn  15 Units Subcutaneous Daily   ipratropium-albuterol  3 mL Nebulization Q6H   loratadine  10 mg Oral Daily   meloxicam  15 mg Oral Daily   methylPREDNISolone (SOLU-MEDROL) injection  40 mg Intravenous Q12H   pantoprazole  40 mg Oral Daily   rosuvastatin  20 mg Oral Daily   Continuous Infusions:  cefTRIAXone (ROCEPHIN)  IV 2 g (08/17/21 2207)    Procedures/Studies: CT Angio Chest Pulmonary Embolism (PE) W or WO Contrast  Result Date: 08/17/2021 CLINICAL DATA:  Positive D-dimer. Chest pain and shortness of breath. Rule out pulmonary embolus.  Abdominal pain. EXAM: CT ANGIOGRAPHY CHEST CT ABDOMEN AND PELVIS WITH CONTRAST TECHNIQUE: Multidetector CT imaging of the chest was performed using the standard protocol during bolus administration of intravenous contrast. Multiplanar CT image reconstructions and MIPs were obtained to evaluate the vascular anatomy. Multidetector CT imaging of the abdomen and pelvis was performed using the standard protocol during bolus administration of intravenous contrast. CONTRAST:  OMNIPAQUE IOHEXOL 350 MG/ML SOLN COMPARISON:  04/21/2020 FINDINGS: CTA CHEST FINDINGS Cardiovascular: Satisfactory opacification of the pulmonary arteries to the segmental level. No evidence of pulmonary embolism. Normal heart size. No pericardial effusion. Mediastinum/Nodes: Thyroid gland, trachea, and esophagus demonstrate no significant findings. Mediastinal and hilar adenopathy is noted. Index right paratracheal lymph node measures 1.5 cm, image 33/5. Formally 1.1 cm. Subcarinal lymph node measures 1.6 cm, image 47/5. Previously 1.2 cm. Left hilar lymph node measures 1.4 cm, image 50/5. Previously 0.8 cm. Right hilar lymph node measures 1.6 cm, image 50/4. Previously 1.2 cm. No supraclavicular or axillary adenopathy. Lungs/Pleura: No pleural effusion. Paraseptal and centrilobular emphysema with diffuse bronchial wall thickening. No pleural effusion. Numerous tree-in-bud nodularity is identified scattered throughout both lungs which is increased when compared with 04/21/2020 but appears similar to 11/27/2019. More focal area of consolidation is noted within the anteromedial left upper lobe measuring 2.3 cm, image 58/7. Areas of subpleural consolidation are noted within the periphery of the inferior right middle lobe and lateral right base. Musculoskeletal: No chest wall abnormality. No acute or significant osseous findings. Review of the MIP images confirms the above findings. CT ABDOMEN and PELVIS FINDINGS Hepatobiliary: There is no focal  liver abnormality. Moderate intrahepatic bile duct dilatation appears similar to the previous exam. The common bile duct is increased in caliber measuring 1.1 cm, image 42/6. Previously this measured the same. No calcified common bile duct stone identified. Gallbladder appears partially collapsed with  mild wall thickening measuring 4 mm, image 26/3. No pericholecystic fluid. No gallstones. Pancreas: Unremarkable. No pancreatic ductal dilatation or surrounding inflammatory changes. Spleen: Normal in size without focal abnormality. Adrenals/Urinary Tract: Normal appearance of the adrenal glands. Right kidney appears normal. Small left kidney cysts. No nephrolithiasis, mass or hydronephrosis. Urinary bladder is unremarkable for degree of distension. Stomach/Bowel: Stomach appears normal. Similar appearance of malrotation deformity. The appendix is visualized and appears normal. No bowel wall thickening, inflammation, or distension. Vascular/Lymphatic: No significant vascular findings are present. No enlarged abdominal or pelvic lymph nodes. Reproductive: Uterus and bilateral adnexa are unremarkable. Other: No free fluid or fluid collections Musculoskeletal: No acute or significant osseous findings. Review of the MIP images confirms the above findings. IMPRESSION: 1. No evidence for acute pulmonary embolus. 2. Extensive tree-in-bud nodularity is identified scattered throughout both lungs. This is increased when compared with 04/21/2020 but appears similar to 11/27/2019. Findings are favored to represent sequelae of recurrent inflammation or infection such as panbronchiolitis or atypical infection. 3. Enlarged mediastinal and hilar lymph nodes are identified. In the acute setting these are likely reactive in etiology. Follow-up imaging in 3 months with repeat CT of the chest with IV contrast material is recommended. This follows ACR consensus guidelines: Managing Incidental Findings on Thoracic CT: Mediastinal and  Cardiovascular Findings. J Am Coll Radiol 2018;15:1087-1096. 4. Stable appearance of intrahepatic and common bile duct dilatation. No calcified common bile duct stone identified. 5. Mild wall thickening involving the gallbladder is noted which may reflect incomplete distention. If there are clinical signs or symptoms worrisome for cholecystitis then right upper quadrant sonogram would be advised. 6. Emphysema and diffuse bronchial wall thickening with emphysema, as above; imaging findings suggestive of underlying COPD. 7. Aortic Atherosclerosis (ICD10-I70.0) and Emphysema (ICD10-J43.9). Electronically Signed   By: Signa Kell M.D.   On: 08/17/2021 12:33   CT ABDOMEN PELVIS W CONTRAST  Result Date: 08/17/2021 CLINICAL DATA:  Positive D-dimer. Chest pain and shortness of breath. Rule out pulmonary embolus. Abdominal pain. EXAM: CT ANGIOGRAPHY CHEST CT ABDOMEN AND PELVIS WITH CONTRAST TECHNIQUE: Multidetector CT imaging of the chest was performed using the standard protocol during bolus administration of intravenous contrast. Multiplanar CT image reconstructions and MIPs were obtained to evaluate the vascular anatomy. Multidetector CT imaging of the abdomen and pelvis was performed using the standard protocol during bolus administration of intravenous contrast. CONTRAST:  OMNIPAQUE IOHEXOL 350 MG/ML SOLN COMPARISON:  04/21/2020 FINDINGS: CTA CHEST FINDINGS Cardiovascular: Satisfactory opacification of the pulmonary arteries to the segmental level. No evidence of pulmonary embolism. Normal heart size. No pericardial effusion. Mediastinum/Nodes: Thyroid gland, trachea, and esophagus demonstrate no significant findings. Mediastinal and hilar adenopathy is noted. Index right paratracheal lymph node measures 1.5 cm, image 33/5. Formally 1.1 cm. Subcarinal lymph node measures 1.6 cm, image 47/5. Previously 1.2 cm. Left hilar lymph node measures 1.4 cm, image 50/5. Previously 0.8 cm. Right hilar lymph node  measures 1.6 cm, image 50/4. Previously 1.2 cm. No supraclavicular or axillary adenopathy. Lungs/Pleura: No pleural effusion. Paraseptal and centrilobular emphysema with diffuse bronchial wall thickening. No pleural effusion. Numerous tree-in-bud nodularity is identified scattered throughout both lungs which is increased when compared with 04/21/2020 but appears similar to 11/27/2019. More focal area of consolidation is noted within the anteromedial left upper lobe measuring 2.3 cm, image 58/7. Areas of subpleural consolidation are noted within the periphery of the inferior right middle lobe and lateral right base. Musculoskeletal: No chest wall abnormality. No acute or significant osseous findings. Review  of the MIP images confirms the above findings. CT ABDOMEN and PELVIS FINDINGS Hepatobiliary: There is no focal liver abnormality. Moderate intrahepatic bile duct dilatation appears similar to the previous exam. The common bile duct is increased in caliber measuring 1.1 cm, image 42/6. Previously this measured the same. No calcified common bile duct stone identified. Gallbladder appears partially collapsed with mild wall thickening measuring 4 mm, image 26/3. No pericholecystic fluid. No gallstones. Pancreas: Unremarkable. No pancreatic ductal dilatation or surrounding inflammatory changes. Spleen: Normal in size without focal abnormality. Adrenals/Urinary Tract: Normal appearance of the adrenal glands. Right kidney appears normal. Small left kidney cysts. No nephrolithiasis, mass or hydronephrosis. Urinary bladder is unremarkable for degree of distension. Stomach/Bowel: Stomach appears normal. Similar appearance of malrotation deformity. The appendix is visualized and appears normal. No bowel wall thickening, inflammation, or distension. Vascular/Lymphatic: No significant vascular findings are present. No enlarged abdominal or pelvic lymph nodes. Reproductive: Uterus and bilateral adnexa are unremarkable. Other:  No free fluid or fluid collections Musculoskeletal: No acute or significant osseous findings. Review of the MIP images confirms the above findings. IMPRESSION: 1. No evidence for acute pulmonary embolus. 2. Extensive tree-in-bud nodularity is identified scattered throughout both lungs. This is increased when compared with 04/21/2020 but appears similar to 11/27/2019. Findings are favored to represent sequelae of recurrent inflammation or infection such as panbronchiolitis or atypical infection. 3. Enlarged mediastinal and hilar lymph nodes are identified. In the acute setting these are likely reactive in etiology. Follow-up imaging in 3 months with repeat CT of the chest with IV contrast material is recommended. This follows ACR consensus guidelines: Managing Incidental Findings on Thoracic CT: Mediastinal and Cardiovascular Findings. J Am Coll Radiol 2018;15:1087-1096. 4. Stable appearance of intrahepatic and common bile duct dilatation. No calcified common bile duct stone identified. 5. Mild wall thickening involving the gallbladder is noted which may reflect incomplete distention. If there are clinical signs or symptoms worrisome for cholecystitis then right upper quadrant sonogram would be advised. 6. Emphysema and diffuse bronchial wall thickening with emphysema, as above; imaging findings suggestive of underlying COPD. 7. Aortic Atherosclerosis (ICD10-I70.0) and Emphysema (ICD10-J43.9). Electronically Signed   By: Signa Kell M.D.   On: 08/17/2021 12:33   DG Chest Port 1 View  Result Date: 08/16/2021 CLINICAL DATA:  Cough, shortness of breath EXAM: PORTABLE CHEST 1 VIEW COMPARISON:  March 07, 2021 FINDINGS: The cardiomediastinal silhouette is unchanged in contour. No pleural effusion. No pneumothorax. Increased diffuse bilateral reticulonodular opacities in a mildly peripheral predominance. Peribronchial cuffing. Visualized abdomen is unremarkable. IMPRESSION: Increased diffuse bilateral reticulonodular  opacities in a peripheral predominant pattern. Findings likely are infectious in etiology. Electronically Signed   By: Meda Klinefelter M.D.   On: 08/16/2021 19:44    Catarina Hartshorn, DO  [facility]  If 7PM-7AM, please contact night-coverage [email or web address] Password [email or web address] 08/18/2021, 2:18 PM   LOS: 2 days

## 2021-08-19 DIAGNOSIS — F17210 Nicotine dependence, cigarettes, uncomplicated: Secondary | ICD-10-CM | POA: Diagnosis not present

## 2021-08-19 DIAGNOSIS — R932 Abnormal findings on diagnostic imaging of liver and biliary tract: Secondary | ICD-10-CM

## 2021-08-19 DIAGNOSIS — J9611 Chronic respiratory failure with hypoxia: Secondary | ICD-10-CM | POA: Diagnosis not present

## 2021-08-19 DIAGNOSIS — J441 Chronic obstructive pulmonary disease with (acute) exacerbation: Secondary | ICD-10-CM | POA: Diagnosis not present

## 2021-08-19 LAB — GLUCOSE, CAPILLARY
Glucose-Capillary: 325 mg/dL — ABNORMAL HIGH (ref 70–99)
Glucose-Capillary: 235 mg/dL — ABNORMAL HIGH (ref 70–99)

## 2021-08-19 LAB — COMPREHENSIVE METABOLIC PANEL
ALT: 18 U/L (ref 0–44)
AST: 14 U/L — ABNORMAL LOW (ref 15–41)
Albumin: 2.5 g/dL — ABNORMAL LOW (ref 3.5–5.0)
Alkaline Phosphatase: 91 U/L (ref 38–126)
Anion gap: 8 (ref 5–15)
BUN: 19 mg/dL (ref 6–20)
CO2: 38 mmol/L — ABNORMAL HIGH (ref 22–32)
Calcium: 8.8 mg/dL — ABNORMAL LOW (ref 8.9–10.3)
Chloride: 89 mmol/L — ABNORMAL LOW (ref 98–111)
Creatinine, Ser: 0.57 mg/dL (ref 0.44–1.00)
GFR, Estimated: >60 mL/min (ref >60)
Glucose, Bld: 405 mg/dL — ABNORMAL HIGH (ref 70–99)
Potassium: 4.6 mmol/L (ref 3.5–5.1)
Sodium: 135 mmol/L (ref 135–145)
Total Bilirubin: 0.1 mg/dL — ABNORMAL LOW (ref 0.3–1.2)
Total Protein: 6.8 g/dL (ref 6.5–8.1)

## 2021-08-19 LAB — CULTURE, RESPIRATORY W GRAM STAIN

## 2021-08-19 LAB — LEGIONELLA PNEUMOPHILA SEROGP 1 UR AG: L. pneumophila Serogp 1 Ur Ag: NEGATIVE

## 2021-08-19 MED ORDER — MELOXICAM 15 MG PO TABS: 15.0000 mg | ORAL_TABLET | Freq: Every day | ORAL | 0 refills | Status: AC

## 2021-08-19 MED ORDER — CEFDINIR 300 MG PO CAPS: 300.0000 mg | ORAL_CAPSULE | Freq: Two times a day (BID) | ORAL | 0 refills | Status: DC

## 2021-08-19 MED ORDER — PREDNISONE 10 MG PO TABS: 60.0000 mg | ORAL_TABLET | Freq: Every day | ORAL | 0 refills | Status: DC

## 2021-08-19 MED ORDER — AZITHROMYCIN 500 MG PO TABS: 500.0000 mg | ORAL_TABLET | Freq: Every day | ORAL | 0 refills | Status: DC

## 2021-08-19 MED ORDER — PREDNISONE 20 MG PO TABS: 60.0000 mg | ORAL_TABLET | Freq: Every day | ORAL | Status: DC

## 2021-08-19 NOTE — Progress Notes (Signed)
Inpatient Diabetes Program Recommendations  AACE/ADA: New Consensus Statement on Inpatient Glycemic Control   Target Ranges:  Prepandial:   less than 140 mg/dL      Peak postprandial:   less than 180 mg/dL (1-2 hours)      Critically ill patients:  140 - 180 mg/dL   Results for Carrozza, Jesse A (MRN [id number]) as of 08/19/2021 08:26  Ref. Range 08/18/2021 07:54 08/18/2021 11:51 08/18/2021 17:20 08/18/2021 21:00 08/19/2021 07:52  Glucose-Capillary Latest Ref Range: 70 - 99 mg/dL 767 (H) 80 341 (H) 937 (H) 325 (H)    Review of Glycemic Control  Diabetes history: DM2 Outpatient Diabetes medications: Lantus 15 units daily, Metformin 1000 mg BID Current orders for Inpatient glycemic control: Semglee 20 units daily, Novolog 0-20 units TID with meals, Novolog 0-5 units QHS; Solumedrol 40 mg Q12H  Inpatient Diabetes Program Recommendations:    Insulin: Noted Semglee increased from 15 to 20 units daily.  If steroids are continued, please consider ordering Novolog 5 units TID with meals for meal coverage if patient eats at least 50% of meals.  Thanks, Orlando Penner, RN, MSN, CDE Diabetes Coordinator Inpatient Diabetes Program [phone] (Team Pager from 8am to 5pm)

## 2021-08-19 NOTE — Discharge Summary (Signed)
Physician Discharge Summary  [name] ZOX:[id number] DOB: [birth year] DOA: 08/16/2021  PCP: Pcp, No  Admit date: 08/16/2021 Discharge date: 08/19/2021  Admitted From: Home Disposition:  Home   Recommendations for Outpatient Follow-up:  Follow up with PCP in 1-2 weeks Please obtain BMP/CBC in one week   Discharge Condition: Stable CODE STATUS: FULL Diet recommendation: Heart Healthy / Carb Modified    Brief/Interim Summary: 55 year old female with a history of COPD, chronic respiratory failure on 3 L, hypertension, chronic pain syndrome, diabetes mellitus type 2, anxiety, tobacco abuse presenting with 4-day history of shortness of breath and coughing with green sputum.  The patient had some subjective fevers and chills.  She denied any hemoptysis, headache, neck pain, hematemesis, diarrhea, dysuria, hematuria.  The patient has been having intermittent chest pain and abdominal pain.  She believes that this is from her coughing.  Upon EMS arrival, the patient was noted to have oxygen saturation of 90% on 3 L with a CBG 345.  She endorses compliance with all her medications.  She states that her chest pain is substernal in nature and sharp with coughing. In the emergency department, the patient was afebrile but tachycardic with a heart rate of 122.  She was hemodynamically stable.  Oxygen saturation was 94-98% on 3 L.  BMP was showed a sodium 130, potassium 3.7, serum creatinine 0.75.  WBC 18.3, hemoglobin 11.9, platelets 461,000.  The patient was started on ceftriaxone, azithromycin, and IV steroids.  Discharge Diagnoses:   Sepsis  -present on admisison -Presented with tachycardia, leukocytosis, and tachypnea -Secondary to pneumonia -Lactic acid 1.0 -Continue ceftriaxone and azithromycin -Continue IV fluids -PCT 0.22 -sepsis physiology resolved   Lobar pneumonia -Personally reviewed chest x-ray--bilateral interstitial and reticulonodular opacities -Continue ceftriaxone and  azithromycin -d/c home with cefdinir and azithromycin x 4 more days   COPD exacerbation -Continue duo nebs -continue Pulmicort -Continue IV Solu-Medrol>>home with prednisone taper   Chest pain/elevated D-dimer -CTA chest--no PE; numerous tree-in-bud nodularity;  more focal consolidation LUL and RML -Cycle troponins4>>3 -Personally reviewed EKG--sinus rhythm, nonspecific ST-T wave changes   Chronic pain syndrome -PMP AWare queried--patient receives monthly oxycodone #120, last given #28 on 8/26; 06/06/21   Diabetes mellitus type 2, uncontrolled with hyperglycemia -9/25 Hemoglobin A1c 7.6 -NovoLog sliding scale -Anticipate elevated CBGs secondary to steroids -Holding metformin -increase Semglee to 20 units   Essential hypertension -Holding irbesartan>>restart after d/c -monitor clinically   Abdominal pain/GB wall Thickening -9/25 CT abd/pelvis-Stable appearance of intrahepatic and common bile ductdilatation. No calcified common bile duct stone identified -resolved -RUQ ultrasound--with borderline gallbladder wall thickening and positive sonographic Murphy sign -check lipase--24 -LFTs normal, Tbili normal -tolerating diet and pain overall resolved -doubt acute cholecystitis clinically -discussed with patient--she does not want to stay in hospital longer for further workup or surgery at this time--she is agreeable for outpatient surgical eval -sent referral to Dr. Franky Macho   Hyponatremia -due to volume depletion -improved with IVF  Discharge Instructions   Allergies as of 08/19/2021       Reactions   Levofloxacin Anaphylaxis   Levofloxacin In D5w Itching, Swelling, Rash   IV only IV only   Penicillins Other (See Comments)   convulsions   Doxycycline Nausea And Vomiting   Morphine Nausea Only        Medication List     STOP taking these medications    Breztri Aerosphere 160-9-4.8 MCG/ACT Aero Generic drug: Budeson-Glycopyrrol-Formoterol   buPROPion  150 MG 12 hr tablet Commonly known as: [employer]  SR   clotrimazole 10 MG troche Commonly known as: MYCELEX   ondansetron 4 MG disintegrating tablet Commonly known as: Zofran ODT   oxyCODONE 5 MG immediate release tablet Commonly known as: Oxy IR/ROXICODONE   SUMAtriptan 50 MG tablet Commonly known as: IMITREX       TAKE these medications    acetaminophen 500 MG tablet Commonly known as: TYLENOL Take 500 mg by mouth every 6 (six) hours as needed for fever.   albuterol 108 (90 Base) MCG/ACT inhaler Commonly known as: VENTOLIN HFA Inhale into the lungs.   albuterol (2.5 MG/3ML) 0.083% nebulizer solution Commonly known as: PROVENTIL Take 3 mLs (2.5 mg total) by nebulization every 6 (six) hours as needed for wheezing or shortness of breath.   azithromycin 500 MG tablet Commonly known as: ZITHROMAX Take 1 tablet (500 mg total) by mouth daily.   cefdinir 300 MG capsule Commonly known as: OMNICEF Take 1 capsule (300 mg total) by mouth 2 (two) times daily.   fluticasone 50 MCG/ACT nasal spray Commonly known as: FLONASE Place 2 sprays into both nostrils daily.   gabapentin 300 MG capsule Commonly known as: NEURONTIN Take 300 mg by mouth 3 (three) times daily.   glucose blood test strip 1 each by Other route 3 (three) times daily. Use as instructed, accucheck test strips   insulin glargine 100 UNIT/ML Solostar Pen Commonly known as: LANTUS Inject 15 Units into the skin daily.   Insulin Pen Needle 31G X 5 MM Misc Use as directed   irbesartan 150 MG tablet Commonly known as: AVAPRO TAKE 1 TABLET BY MOUTH EVERY DAY   loratadine 10 MG tablet Commonly known as: CLARITIN Take 10 mg by mouth daily.   meloxicam 15 MG tablet Commonly known as: Mobic Take 1 tablet (15 mg total) by mouth daily.   metFORMIN 500 MG tablet Commonly known as: GLUCOPHAGE Take 2 tablets (1,000 mg total) by mouth 2 (two) times daily. What changed: Another medication with the same name was  removed. Continue taking this medication, and follow the directions you see here.   methocarbamol 500 MG tablet Commonly known as: ROBAXIN Take 500 mg by mouth every 8 (eight) hours as needed.   pantoprazole 40 MG tablet Commonly known as: PROTONIX Take 1 tablet (40 mg total) by mouth daily.   predniSONE 10 MG tablet Commonly known as: DELTASONE Take 6 tablets (60 mg total) by mouth daily with breakfast. And decrease by one tablet daily Start taking on: August 20, 2021   rosuvastatin 20 MG tablet Commonly known as: CRESTOR Take 20 mg by mouth daily.        Allergies  Allergen Reactions   Levofloxacin Anaphylaxis   Levofloxacin In D5w Itching, Swelling and Rash    IV only IV only   Penicillins Other (See Comments)    convulsions   Doxycycline Nausea And Vomiting   Morphine Nausea Only    Consultations: none   Procedures/Studies: CT Angio Chest Pulmonary Embolism (PE) W or WO Contrast  Result Date: 08/17/2021 CLINICAL DATA:  Positive D-dimer. Chest pain and shortness of breath. Rule out pulmonary embolus. Abdominal pain. EXAM: CT ANGIOGRAPHY CHEST CT ABDOMEN AND PELVIS WITH CONTRAST TECHNIQUE: Multidetector CT imaging of the chest was performed using the standard protocol during bolus administration of intravenous contrast. Multiplanar CT image reconstructions and MIPs were obtained to evaluate the vascular anatomy. Multidetector CT imaging of the abdomen and pelvis was performed using the standard protocol during bolus administration of intravenous contrast. CONTRAST:   OMNIPAQUE IOHEXOL 350 MG/ML SOLN COMPARISON:  04/21/2020 FINDINGS: CTA CHEST FINDINGS Cardiovascular: Satisfactory opacification of the pulmonary arteries to the segmental level. No evidence of pulmonary embolism. Normal heart size. No pericardial effusion. Mediastinum/Nodes: Thyroid gland, trachea, and esophagus demonstrate no significant findings. Mediastinal and hilar adenopathy is noted. Index  right paratracheal lymph node measures 1.5 cm, image 33/5. Formally 1.1 cm. Subcarinal lymph node measures 1.6 cm, image 47/5. Previously 1.2 cm. Left hilar lymph node measures 1.4 cm, image 50/5. Previously 0.8 cm. Right hilar lymph node measures 1.6 cm, image 50/4. Previously 1.2 cm. No supraclavicular or axillary adenopathy. Lungs/Pleura: No pleural effusion. Paraseptal and centrilobular emphysema with diffuse bronchial wall thickening. No pleural effusion. Numerous tree-in-bud nodularity is identified scattered throughout both lungs which is increased when compared with 04/21/2020 but appears similar to 11/27/2019. More focal area of consolidation is noted within the anteromedial left upper lobe measuring 2.3 cm, image 58/7. Areas of subpleural consolidation are noted within the periphery of the inferior right middle lobe and lateral right base. Musculoskeletal: No chest wall abnormality. No acute or significant osseous findings. Review of the MIP images confirms the above findings. CT ABDOMEN and PELVIS FINDINGS Hepatobiliary: There is no focal liver abnormality. Moderate intrahepatic bile duct dilatation appears similar to the previous exam. The common bile duct is increased in caliber measuring 1.1 cm, image 42/6. Previously this measured the same. No calcified common bile duct stone identified. Gallbladder appears partially collapsed with mild wall thickening measuring 4 mm, image 26/3. No pericholecystic fluid. No gallstones. Pancreas: Unremarkable. No pancreatic ductal dilatation or surrounding inflammatory changes. Spleen: Normal in size without focal abnormality. Adrenals/Urinary Tract: Normal appearance of the adrenal glands. Right kidney appears normal. Small left kidney cysts. No nephrolithiasis, mass or hydronephrosis. Urinary bladder is unremarkable for degree of distension. Stomach/Bowel: Stomach appears normal. Similar appearance of malrotation deformity. The appendix is visualized and appears  normal. No bowel wall thickening, inflammation, or distension. Vascular/Lymphatic: No significant vascular findings are present. No enlarged abdominal or pelvic lymph nodes. Reproductive: Uterus and bilateral adnexa are unremarkable. Other: No free fluid or fluid collections Musculoskeletal: No acute or significant osseous findings. Review of the MIP images confirms the above findings. IMPRESSION: 1. No evidence for acute pulmonary embolus. 2. Extensive tree-in-bud nodularity is identified scattered throughout both lungs. This is increased when compared with 04/21/2020 but appears similar to 11/27/2019. Findings are favored to represent sequelae of recurrent inflammation or infection such as panbronchiolitis or atypical infection. 3. Enlarged mediastinal and hilar lymph nodes are identified. In the acute setting these are likely reactive in etiology. Follow-up imaging in 3 months with repeat CT of the chest with IV contrast material is recommended. This follows ACR consensus guidelines: Managing Incidental Findings on Thoracic CT: Mediastinal and Cardiovascular Findings. J Am Coll Radiol 2018;15:1087-1096. 4. Stable appearance of intrahepatic and common bile duct dilatation. No calcified common bile duct stone identified. 5. Mild wall thickening involving the gallbladder is noted which may reflect incomplete distention. If there are clinical signs or symptoms worrisome for cholecystitis then right upper quadrant sonogram would be advised. 6. Emphysema and diffuse bronchial wall thickening with emphysema, as above; imaging findings suggestive of underlying COPD. 7. Aortic Atherosclerosis (ICD10-I70.0) and Emphysema (ICD10-J43.9). Electronically Signed   By: Signa Kell M.D.   On: 08/17/2021 12:33   CT ABDOMEN PELVIS W CONTRAST  Result Date: 08/17/2021 CLINICAL DATA:  Positive D-dimer. Chest pain and shortness of breath. Rule out pulmonary embolus. Abdominal pain. EXAM: CT ANGIOGRAPHY CHEST  CT ABDOMEN AND  PELVIS WITH CONTRAST TECHNIQUE: Multidetector CT imaging of the chest was performed using the standard protocol during bolus administration of intravenous contrast. Multiplanar CT image reconstructions and MIPs were obtained to evaluate the vascular anatomy. Multidetector CT imaging of the abdomen and pelvis was performed using the standard protocol during bolus administration of intravenous contrast. CONTRAST:  OMNIPAQUE IOHEXOL 350 MG/ML SOLN COMPARISON:  04/21/2020 FINDINGS: CTA CHEST FINDINGS Cardiovascular: Satisfactory opacification of the pulmonary arteries to the segmental level. No evidence of pulmonary embolism. Normal heart size. No pericardial effusion. Mediastinum/Nodes: Thyroid gland, trachea, and esophagus demonstrate no significant findings. Mediastinal and hilar adenopathy is noted. Index right paratracheal lymph node measures 1.5 cm, image 33/5. Formally 1.1 cm. Subcarinal lymph node measures 1.6 cm, image 47/5. Previously 1.2 cm. Left hilar lymph node measures 1.4 cm, image 50/5. Previously 0.8 cm. Right hilar lymph node measures 1.6 cm, image 50/4. Previously 1.2 cm. No supraclavicular or axillary adenopathy. Lungs/Pleura: No pleural effusion. Paraseptal and centrilobular emphysema with diffuse bronchial wall thickening. No pleural effusion. Numerous tree-in-bud nodularity is identified scattered throughout both lungs which is increased when compared with 04/21/2020 but appears similar to 11/27/2019. More focal area of consolidation is noted within the anteromedial left upper lobe measuring 2.3 cm, image 58/7. Areas of subpleural consolidation are noted within the periphery of the inferior right middle lobe and lateral right base. Musculoskeletal: No chest wall abnormality. No acute or significant osseous findings. Review of the MIP images confirms the above findings. CT ABDOMEN and PELVIS FINDINGS Hepatobiliary: There is no focal liver abnormality. Moderate intrahepatic bile duct  dilatation appears similar to the previous exam. The common bile duct is increased in caliber measuring 1.1 cm, image 42/6. Previously this measured the same. No calcified common bile duct stone identified. Gallbladder appears partially collapsed with mild wall thickening measuring 4 mm, image 26/3. No pericholecystic fluid. No gallstones. Pancreas: Unremarkable. No pancreatic ductal dilatation or surrounding inflammatory changes. Spleen: Normal in size without focal abnormality. Adrenals/Urinary Tract: Normal appearance of the adrenal glands. Right kidney appears normal. Small left kidney cysts. No nephrolithiasis, mass or hydronephrosis. Urinary bladder is unremarkable for degree of distension. Stomach/Bowel: Stomach appears normal. Similar appearance of malrotation deformity. The appendix is visualized and appears normal. No bowel wall thickening, inflammation, or distension. Vascular/Lymphatic: No significant vascular findings are present. No enlarged abdominal or pelvic lymph nodes. Reproductive: Uterus and bilateral adnexa are unremarkable. Other: No free fluid or fluid collections Musculoskeletal: No acute or significant osseous findings. Review of the MIP images confirms the above findings. IMPRESSION: 1. No evidence for acute pulmonary embolus. 2. Extensive tree-in-bud nodularity is identified scattered throughout both lungs. This is increased when compared with 04/21/2020 but appears similar to 11/27/2019. Findings are favored to represent sequelae of recurrent inflammation or infection such as panbronchiolitis or atypical infection. 3. Enlarged mediastinal and hilar lymph nodes are identified. In the acute setting these are likely reactive in etiology. Follow-up imaging in 3 months with repeat CT of the chest with IV contrast material is recommended. This follows ACR consensus guidelines: Managing Incidental Findings on Thoracic CT: Mediastinal and Cardiovascular Findings. J Am Coll Radiol  2018;15:1087-1096. 4. Stable appearance of intrahepatic and common bile duct dilatation. No calcified common bile duct stone identified. 5. Mild wall thickening involving the gallbladder is noted which may reflect incomplete distention. If there are clinical signs or symptoms worrisome for cholecystitis then right upper quadrant sonogram would be advised. 6. Emphysema and diffuse bronchial wall thickening with  emphysema, as above; imaging findings suggestive of underlying COPD. 7. Aortic Atherosclerosis (ICD10-I70.0) and Emphysema (ICD10-J43.9). Electronically Signed   By: Signa Kell M.D.   On: 08/17/2021 12:33   DG Chest Port 1 View  Result Date: 08/16/2021 CLINICAL DATA:  Cough, shortness of breath EXAM: PORTABLE CHEST 1 VIEW COMPARISON:  March 07, 2021 FINDINGS: The cardiomediastinal silhouette is unchanged in contour. No pleural effusion. No pneumothorax. Increased diffuse bilateral reticulonodular opacities in a mildly peripheral predominance. Peribronchial cuffing. Visualized abdomen is unremarkable. IMPRESSION: Increased diffuse bilateral reticulonodular opacities in a peripheral predominant pattern. Findings likely are infectious in etiology. Electronically Signed   By: Meda Klinefelter M.D.   On: 08/16/2021 19:44   US Abdomen Limited RUQ (LIVER/GB)  Result Date: 08/18/2021 CLINICAL DATA:  Abdominal pain, cholelithiasis EXAM: ULTRASOUND ABDOMEN LIMITED RIGHT UPPER QUADRANT COMPARISON:  08/17/2021, 11/29/2019 FINDINGS: Gallbladder: Multiple shadowing gallstones are identified, measuring up to 2.3 cm in size. Borderline gallbladder wall thickening measuring 3 mm. No pericholecystic fluid. Positive sonographic Murphy sign. Common bile duct: Diameter: 11 mm Liver: No focal lesion identified. Within normal limits in parenchymal echogenicity. Portal vein is patent on color Doppler imaging with normal direction of blood flow towards the liver. Other: None. IMPRESSION: 1. Cholelithiasis, with  borderline gallbladder wall thickening and positive sonographic Murphy sign compatible with acute cholecystitis. 2. Chronic dilation of the common bile duct, nonspecific. No evidence of choledocholithiasis. Electronically Signed   By: Sharlet Salina M.D.   On: 08/18/2021 15:29        Discharge Exam: Vitals:   08/19/21 0721 08/19/21 0913  BP:  132/79  Pulse:  96  Resp:    Temp:  98 F (36.7 C)  SpO2: 96% 97%   Vitals:   08/19/21 0252 08/19/21 0554 08/19/21 0721 08/19/21 0913  BP:  (!) 147/95  132/79  Pulse:  82  96  Resp:  20    Temp:  98.1 F (36.7 C)  98 F (36.7 C)  TempSrc:  Oral  Oral  SpO2: 97% 97% 96% 97%  Weight:      Height:        General: Pt is alert, awake, not in acute distress Cardiovascular: RRR, S1/S2 +, no rubs, no gallops Respiratory: bibasilar rales. No wheeze. Abdominal: Soft, NT, ND, bowel sounds + Extremities: no edema, no cyanosis   The results of significant diagnostics from this hospitalization (including imaging, microbiology, ancillary and laboratory) are listed below for reference.    Significant Diagnostic Studies: CT Angio Chest Pulmonary Embolism (PE) W or WO Contrast  Result Date: 08/17/2021 CLINICAL DATA:  Positive D-dimer. Chest pain and shortness of breath. Rule out pulmonary embolus. Abdominal pain. EXAM: CT ANGIOGRAPHY CHEST CT ABDOMEN AND PELVIS WITH CONTRAST TECHNIQUE: Multidetector CT imaging of the chest was performed using the standard protocol during bolus administration of intravenous contrast. Multiplanar CT image reconstructions and MIPs were obtained to evaluate the vascular anatomy. Multidetector CT imaging of the abdomen and pelvis was performed using the standard protocol during bolus administration of intravenous contrast. CONTRAST:  OMNIPAQUE IOHEXOL 350 MG/ML SOLN COMPARISON:  04/21/2020 FINDINGS: CTA CHEST FINDINGS Cardiovascular: Satisfactory opacification of the pulmonary arteries to the segmental level. No  evidence of pulmonary embolism. Normal heart size. No pericardial effusion. Mediastinum/Nodes: Thyroid gland, trachea, and esophagus demonstrate no significant findings. Mediastinal and hilar adenopathy is noted. Index right paratracheal lymph node measures 1.5 cm, image 33/5. Formally 1.1 cm. Subcarinal lymph node measures 1.6 cm, image 47/5. Previously 1.2 cm. Left hilar lymph  node measures 1.4 cm, image 50/5. Previously 0.8 cm. Right hilar lymph node measures 1.6 cm, image 50/4. Previously 1.2 cm. No supraclavicular or axillary adenopathy. Lungs/Pleura: No pleural effusion. Paraseptal and centrilobular emphysema with diffuse bronchial wall thickening. No pleural effusion. Numerous tree-in-bud nodularity is identified scattered throughout both lungs which is increased when compared with 04/21/2020 but appears similar to 11/27/2019. More focal area of consolidation is noted within the anteromedial left upper lobe measuring 2.3 cm, image 58/7. Areas of subpleural consolidation are noted within the periphery of the inferior right middle lobe and lateral right base. Musculoskeletal: No chest wall abnormality. No acute or significant osseous findings. Review of the MIP images confirms the above findings. CT ABDOMEN and PELVIS FINDINGS Hepatobiliary: There is no focal liver abnormality. Moderate intrahepatic bile duct dilatation appears similar to the previous exam. The common bile duct is increased in caliber measuring 1.1 cm, image 42/6. Previously this measured the same. No calcified common bile duct stone identified. Gallbladder appears partially collapsed with mild wall thickening measuring 4 mm, image 26/3. No pericholecystic fluid. No gallstones. Pancreas: Unremarkable. No pancreatic ductal dilatation or surrounding inflammatory changes. Spleen: Normal in size without focal abnormality. Adrenals/Urinary Tract: Normal appearance of the adrenal glands. Right kidney appears normal. Small left kidney cysts. No  nephrolithiasis, mass or hydronephrosis. Urinary bladder is unremarkable for degree of distension. Stomach/Bowel: Stomach appears normal. Similar appearance of malrotation deformity. The appendix is visualized and appears normal. No bowel wall thickening, inflammation, or distension. Vascular/Lymphatic: No significant vascular findings are present. No enlarged abdominal or pelvic lymph nodes. Reproductive: Uterus and bilateral adnexa are unremarkable. Other: No free fluid or fluid collections Musculoskeletal: No acute or significant osseous findings. Review of the MIP images confirms the above findings. IMPRESSION: 1. No evidence for acute pulmonary embolus. 2. Extensive tree-in-bud nodularity is identified scattered throughout both lungs. This is increased when compared with 04/21/2020 but appears similar to 11/27/2019. Findings are favored to represent sequelae of recurrent inflammation or infection such as panbronchiolitis or atypical infection. 3. Enlarged mediastinal and hilar lymph nodes are identified. In the acute setting these are likely reactive in etiology. Follow-up imaging in 3 months with repeat CT of the chest with IV contrast material is recommended. This follows ACR consensus guidelines: Managing Incidental Findings on Thoracic CT: Mediastinal and Cardiovascular Findings. J Am Coll Radiol 2018;15:1087-1096. 4. Stable appearance of intrahepatic and common bile duct dilatation. No calcified common bile duct stone identified. 5. Mild wall thickening involving the gallbladder is noted which may reflect incomplete distention. If there are clinical signs or symptoms worrisome for cholecystitis then right upper quadrant sonogram would be advised. 6. Emphysema and diffuse bronchial wall thickening with emphysema, as above; imaging findings suggestive of underlying COPD. 7. Aortic Atherosclerosis (ICD10-I70.0) and Emphysema (ICD10-J43.9). Electronically Signed   By: Signa Kell M.D.   On: 08/17/2021 12:33    CT ABDOMEN PELVIS W CONTRAST  Result Date: 08/17/2021 CLINICAL DATA:  Positive D-dimer. Chest pain and shortness of breath. Rule out pulmonary embolus. Abdominal pain. EXAM: CT ANGIOGRAPHY CHEST CT ABDOMEN AND PELVIS WITH CONTRAST TECHNIQUE: Multidetector CT imaging of the chest was performed using the standard protocol during bolus administration of intravenous contrast. Multiplanar CT image reconstructions and MIPs were obtained to evaluate the vascular anatomy. Multidetector CT imaging of the abdomen and pelvis was performed using the standard protocol during bolus administration of intravenous contrast. CONTRAST:  OMNIPAQUE IOHEXOL 350 MG/ML SOLN COMPARISON:  04/21/2020 FINDINGS: CTA CHEST FINDINGS Cardiovascular: Satisfactory opacification of  the pulmonary arteries to the segmental level. No evidence of pulmonary embolism. Normal heart size. No pericardial effusion. Mediastinum/Nodes: Thyroid gland, trachea, and esophagus demonstrate no significant findings. Mediastinal and hilar adenopathy is noted. Index right paratracheal lymph node measures 1.5 cm, image 33/5. Formally 1.1 cm. Subcarinal lymph node measures 1.6 cm, image 47/5. Previously 1.2 cm. Left hilar lymph node measures 1.4 cm, image 50/5. Previously 0.8 cm. Right hilar lymph node measures 1.6 cm, image 50/4. Previously 1.2 cm. No supraclavicular or axillary adenopathy. Lungs/Pleura: No pleural effusion. Paraseptal and centrilobular emphysema with diffuse bronchial wall thickening. No pleural effusion. Numerous tree-in-bud nodularity is identified scattered throughout both lungs which is increased when compared with 04/21/2020 but appears similar to 11/27/2019. More focal area of consolidation is noted within the anteromedial left upper lobe measuring 2.3 cm, image 58/7. Areas of subpleural consolidation are noted within the periphery of the inferior right middle lobe and lateral right base. Musculoskeletal: No chest wall abnormality. No  acute or significant osseous findings. Review of the MIP images confirms the above findings. CT ABDOMEN and PELVIS FINDINGS Hepatobiliary: There is no focal liver abnormality. Moderate intrahepatic bile duct dilatation appears similar to the previous exam. The common bile duct is increased in caliber measuring 1.1 cm, image 42/6. Previously this measured the same. No calcified common bile duct stone identified. Gallbladder appears partially collapsed with mild wall thickening measuring 4 mm, image 26/3. No pericholecystic fluid. No gallstones. Pancreas: Unremarkable. No pancreatic ductal dilatation or surrounding inflammatory changes. Spleen: Normal in size without focal abnormality. Adrenals/Urinary Tract: Normal appearance of the adrenal glands. Right kidney appears normal. Small left kidney cysts. No nephrolithiasis, mass or hydronephrosis. Urinary bladder is unremarkable for degree of distension. Stomach/Bowel: Stomach appears normal. Similar appearance of malrotation deformity. The appendix is visualized and appears normal. No bowel wall thickening, inflammation, or distension. Vascular/Lymphatic: No significant vascular findings are present. No enlarged abdominal or pelvic lymph nodes. Reproductive: Uterus and bilateral adnexa are unremarkable. Other: No free fluid or fluid collections Musculoskeletal: No acute or significant osseous findings. Review of the MIP images confirms the above findings. IMPRESSION: 1. No evidence for acute pulmonary embolus. 2. Extensive tree-in-bud nodularity is identified scattered throughout both lungs. This is increased when compared with 04/21/2020 but appears similar to 11/27/2019. Findings are favored to represent sequelae of recurrent inflammation or infection such as panbronchiolitis or atypical infection. 3. Enlarged mediastinal and hilar lymph nodes are identified. In the acute setting these are likely reactive in etiology. Follow-up imaging in 3 months with repeat CT of  the chest with IV contrast material is recommended. This follows ACR consensus guidelines: Managing Incidental Findings on Thoracic CT: Mediastinal and Cardiovascular Findings. J Am Coll Radiol 2018;15:1087-1096. 4. Stable appearance of intrahepatic and common bile duct dilatation. No calcified common bile duct stone identified. 5. Mild wall thickening involving the gallbladder is noted which may reflect incomplete distention. If there are clinical signs or symptoms worrisome for cholecystitis then right upper quadrant sonogram would be advised. 6. Emphysema and diffuse bronchial wall thickening with emphysema, as above; imaging findings suggestive of underlying COPD. 7. Aortic Atherosclerosis (ICD10-I70.0) and Emphysema (ICD10-J43.9). Electronically Signed   By: Signa Kell M.D.   On: 08/17/2021 12:33   DG Chest Port 1 View  Result Date: 08/16/2021 CLINICAL DATA:  Cough, shortness of breath EXAM: PORTABLE CHEST 1 VIEW COMPARISON:  March 07, 2021 FINDINGS: The cardiomediastinal silhouette is unchanged in contour. No pleural effusion. No pneumothorax. Increased diffuse bilateral reticulonodular opacities in a  mildly peripheral predominance. Peribronchial cuffing. Visualized abdomen is unremarkable. IMPRESSION: Increased diffuse bilateral reticulonodular opacities in a peripheral predominant pattern. Findings likely are infectious in etiology. Electronically Signed   By: Meda Klinefelter M.D.   On: 08/16/2021 19:44   US Abdomen Limited RUQ (LIVER/GB)  Result Date: 08/18/2021 CLINICAL DATA:  Abdominal pain, cholelithiasis EXAM: ULTRASOUND ABDOMEN LIMITED RIGHT UPPER QUADRANT COMPARISON:  08/17/2021, 11/29/2019 FINDINGS: Gallbladder: Multiple shadowing gallstones are identified, measuring up to 2.3 cm in size. Borderline gallbladder wall thickening measuring 3 mm. No pericholecystic fluid. Positive sonographic Murphy sign. Common bile duct: Diameter: 11 mm Liver: No focal lesion identified. Within normal  limits in parenchymal echogenicity. Portal vein is patent on color Doppler imaging with normal direction of blood flow towards the liver. Other: None. IMPRESSION: 1. Cholelithiasis, with borderline gallbladder wall thickening and positive sonographic Murphy sign compatible with acute cholecystitis. 2. Chronic dilation of the common bile duct, nonspecific. No evidence of choledocholithiasis. Electronically Signed   By: Sharlet Salina M.D.   On: 08/18/2021 15:29    Microbiology: Recent Results (from the past 240 hour(s))  Resp Panel by RT-PCR (Flu A&B, Covid) Nasopharyngeal Swab     Status: None   Collection Time: 08/16/21  7:10 PM   Specimen: Nasopharyngeal Swab; Nasopharyngeal(NP) swabs in vial transport medium  Result Value Ref Range Status   SARS Coronavirus 2 by RT PCR NEGATIVE NEGATIVE Final    Comment: (NOTE) SARS-CoV-2 target nucleic acids are NOT DETECTED.  The SARS-CoV-2 RNA is generally detectable in upper respiratory specimens during the acute phase of infection. The lowest concentration of SARS-CoV-2 viral copies this assay can detect is 138 copies/mL. A negative result does not preclude SARS-Cov-2 infection and should not be used as the sole basis for treatment or other patient management decisions. A negative result may occur with  improper specimen collection/handling, submission of specimen other than nasopharyngeal swab, presence of viral mutation(s) within the areas targeted by this assay, and inadequate number of viral copies(<138 copies/mL). A negative result must be combined with clinical observations, patient history, and epidemiological information. The expected result is Negative.  Fact Sheet for Patients:  BloggerCourse.com  Fact Sheet for Healthcare Providers:  SeriousBroker.it  This test is no t yet approved or cleared by the [place] FDA and  has been authorized for detection and/or diagnosis of  SARS-CoV-2 by FDA under an Emergency Use Authorization (EUA). This EUA will remain  in effect (meaning this test can be used) for the duration of the COVID-19 declaration under Section 564(b)(1) of the Act, 21 U.S.C.section 360bbb-3(b)(1), unless the authorization is terminated  or revoked sooner.       Influenza A by PCR NEGATIVE NEGATIVE Final   Influenza B by PCR NEGATIVE NEGATIVE Final    Comment: (NOTE) The Xpert Xpress SARS-CoV-2/FLU/RSV plus assay is intended as an aid in the diagnosis of influenza from Nasopharyngeal swab specimens and should not be used as a sole basis for treatment. Nasal washings and aspirates are unacceptable for Xpert Xpress SARS-CoV-2/FLU/RSV testing.  Fact Sheet for Patients: BloggerCourse.com  Fact Sheet for Healthcare Providers: SeriousBroker.it  This test is not yet approved or cleared by the [place] FDA and has been authorized for detection and/or diagnosis of SARS-CoV-2 by FDA under an Emergency Use Authorization (EUA). This EUA will remain in effect (meaning this test can be used) for the duration of the COVID-19 declaration under Section 564(b)(1) of the Act, 21 U.S.C. section 360bbb-3(b)(1), unless the authorization is terminated or revoked.  Performed at [facility], [address], [place], [place] [address]   Blood culture (routine x 2)     Status: None (Preliminary result)   Collection Time: 08/16/21  9:09 PM   Specimen: BLOOD RIGHT ARM  Result Value Ref Range Status   Specimen Description BLOOD RIGHT ARM  Final   Special Requests   Final    BOTTLES DRAWN AEROBIC AND ANAEROBIC Blood Culture adequate volume   Culture   Final    NO GROWTH 3 DAYS Performed at [facility], [address], [place], [place] [address]    Report Status PENDING  Incomplete  Blood culture (routine x 2)     Status: None (Preliminary result)   Collection Time: 08/16/21  9:16 PM   Specimen: BLOOD LEFT  HAND  Result Value Ref Range Status   Specimen Description BLOOD LEFT HAND  Final   Special Requests   Final    BOTTLES DRAWN AEROBIC AND ANAEROBIC Blood Culture results may not be optimal due to an excessive volume of blood received in culture bottles   Culture   Final    NO GROWTH 3 DAYS Performed at [facility], [address] [place], [place] [address]    Report Status PENDING  Incomplete  Expectorated Sputum Assessment w Gram Stain, Rflx to Resp Cult     Status: None   Collection Time: 08/17/21  2:36 AM   Specimen: Sputum  Result Value Ref Range Status   Specimen Description SPUTUM  Final   Special Requests NONE  Final   Sputum evaluation   Final    THIS SPECIMEN IS ACCEPTABLE FOR SPUTUM CULTURE Performed at [facility], [address] [place], [place] [address]    Report Status 08/17/2021 FINAL  Final  Culture, Respiratory w Gram Stain     Status: None   Collection Time: 08/17/21  2:36 AM   Specimen: SPU  Result Value Ref Range Status   Specimen Description   Final    SPUTUM Performed at [facility], [address], [place], [place] [address]    Special Requests   Final    NONE Reflexed from [id number] Performed at [facility], [address], [place], [place] [address]    Gram Stain   Final    ABUNDANT WBC PRESENT, PREDOMINANTLY PMN FEW GRAM POSITIVE COCCI RARE GRAM NEGATIVE RODS    Culture   Final    FEW STREPTOCOCCUS PYOGENES Beta hemolytic streptococci are predictably susceptible to penicillin and other beta lactams. Susceptibility testing not routinely performed. Performed at [facility], [address] [place], [place] [address]    Report Status 08/19/2021 FINAL  Final     Labs: Basic Metabolic Panel: Recent Labs  Lab 08/16/21 1910 08/17/21 0714 08/18/21 0623 08/19/21 0602  NA 130* 136 132* 135  K 3.7 4.0 3.8 4.6  CL 85* 94* 90* 89*  CO2 36* 35* 35* 38*  GLUCOSE 330* 335* 419* 405*  BUN CREATININE 0.75 0.48 0.63 0.57  CALCIUM  8.8* 8.7* 8.7* 8.8*  MG  --   --  1.9  --    Liver Function Tests: Recent Labs  Lab 08/17/21 0714 08/18/21 0623 08/19/21 0602  AST 31 18 14*  ALT ALKPHOS 104 89 91  BILITOT 0.0* <0.1* 0.1*  PROT 7.3 6.5 6.8  ALBUMIN 2.5* 2.4* 2.5*   Recent Labs  Lab 08/17/21 0919  LIPASE 24   No results for input(s): AMMONIA in  the last 168 hours. CBC: Recent Labs  Lab 08/16/21 1910 08/17/21 0558 08/18/21 0623  WBC 18.3* 10.4 20.3*  HGB 11.9* 10.2* 8.9*  HCT 38.4 33.9* 29.0*  MCV 86.3 86.7 86.1  PLT 461* 388 397   Cardiac Enzymes: No results for input(s): CKTOTAL, CKMB, CKMBINDEX, TROPONINI in the last 168 hours. BNP: Invalid input(s): POCBNP CBG: Recent Labs  Lab 08/18/21 1151 08/18/21 1720 08/18/21 2100 08/19/21 0752 08/19/21 1056  GLUCAP 80 286* 196* 325* 235*    Time coordinating discharge:  36 minutes  Signed:  Catarina Hartshorn, DO [facility] Pager: [phone] 08/19/2021, 12:29 PM

## 2021-08-21 LAB — CULTURE, BLOOD (ROUTINE X 2)
Culture: NO GROWTH
Culture: NO GROWTH
Special Requests: ADEQUATE

## 2021-08-22 DIAGNOSIS — G43909 Migraine, unspecified, not intractable, without status migrainosus: Secondary | ICD-10-CM | POA: Insufficient documentation

## 2021-08-22 DIAGNOSIS — E1142 Type 2 diabetes mellitus with diabetic polyneuropathy: Secondary | ICD-10-CM | POA: Insufficient documentation

## 2021-08-22 DIAGNOSIS — Z9981 Dependence on supplemental oxygen: Secondary | ICD-10-CM | POA: Insufficient documentation

## 2021-10-06 ENCOUNTER — Ambulatory Visit (HOSPITAL_COMMUNITY)
Admission: RE | Admit: 2021-10-06 | Discharge: 2021-10-06 | Disposition: A | Discharge status: Home / Self Care | Payer: Medicaid Other | Source: Ambulatory Visit | Attending: Family Medicine | Admitting: Family Medicine

## 2021-10-06 ENCOUNTER — Other Ambulatory Visit (HOSPITAL_COMMUNITY): Payer: Self-pay | Admitting: Family Medicine

## 2021-10-06 ENCOUNTER — Other Ambulatory Visit: Payer: Self-pay

## 2021-10-06 DIAGNOSIS — M25511 Pain in right shoulder: Secondary | ICD-10-CM

## 2021-11-02 IMAGING — DX DG CHEST 1V PORT
1 series · 1 of 1 positions shown · non-contrast
Comparison: None.

CLINICAL DATA: Shortness of breath

EXAM:
PORTABLE CHEST 1 VIEW

[chest ap]
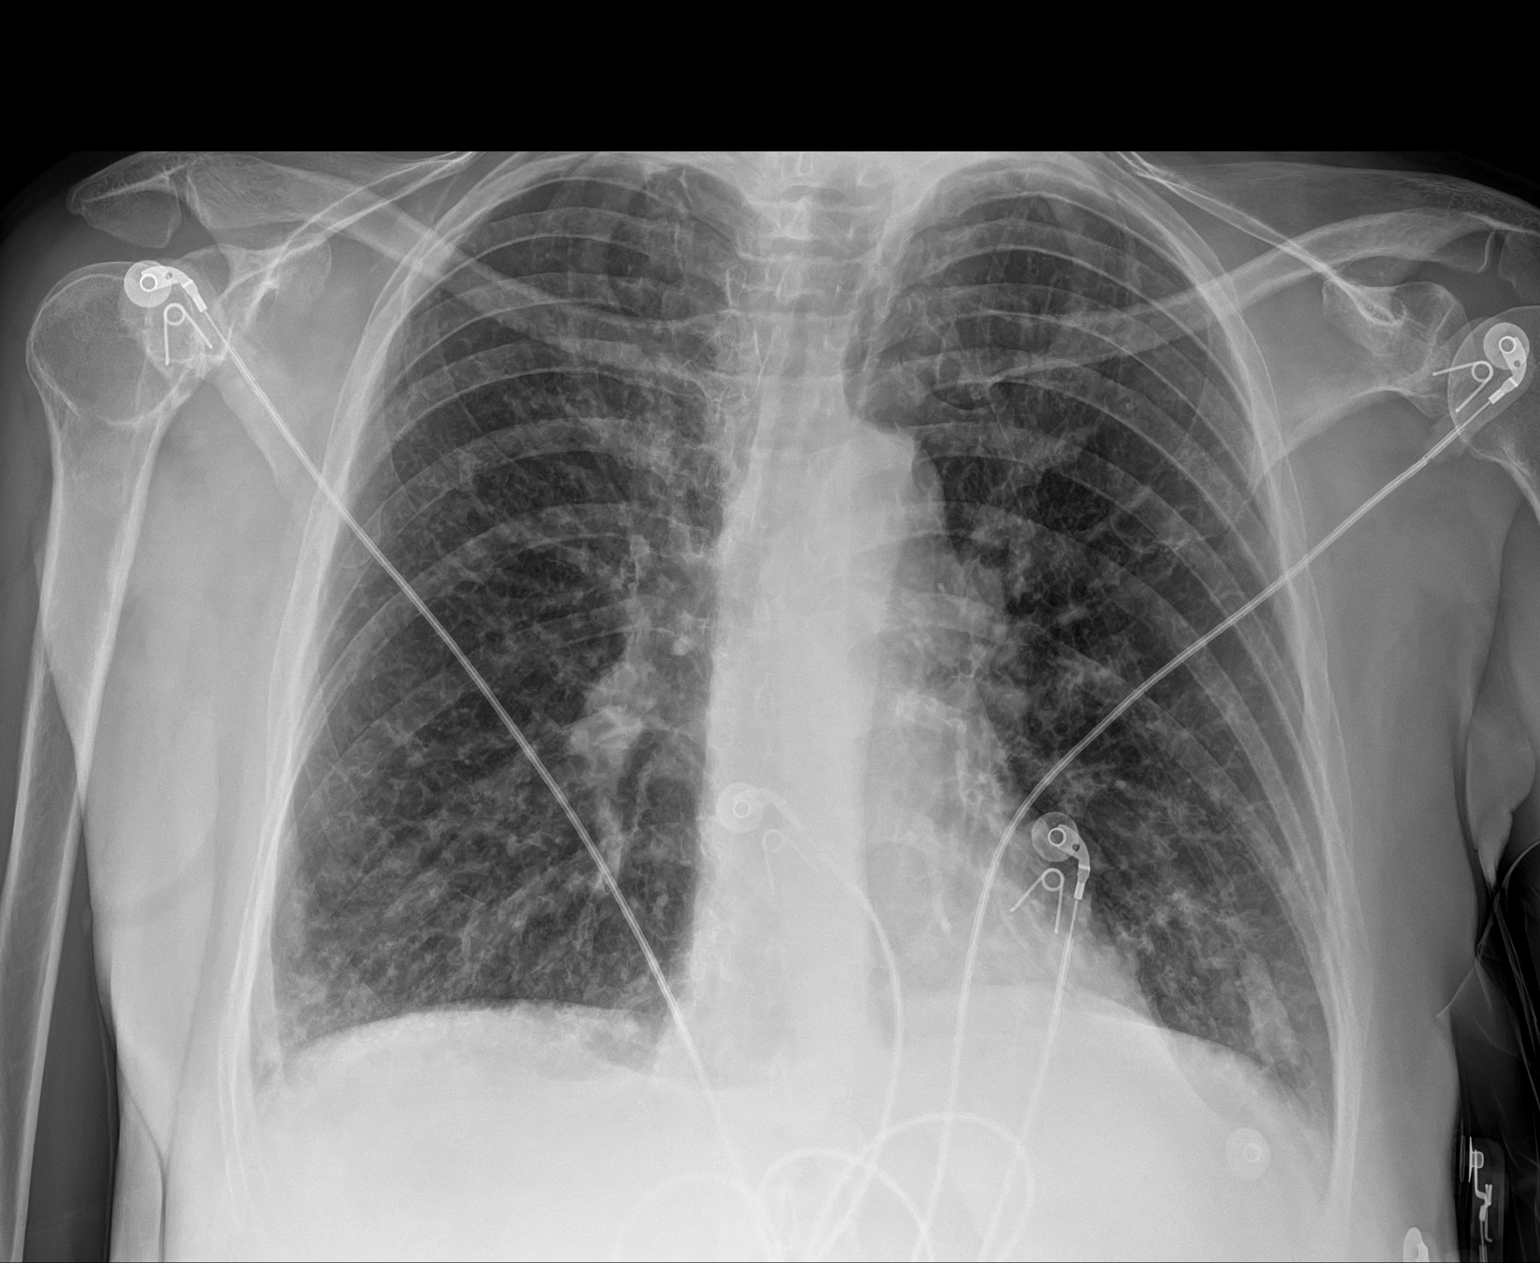

[1 of 1 positions shown; findings below may reference images not displayed]

FINDINGS: Diffuse interstitial prominence, stable since prior study. Heart is
normal size. No confluent airspace opacities or effusions. No acute
bony abnormality.
IMPRESSION: Stable chronic interstitial lung disease.  No active disease.

## 2021-11-28 ENCOUNTER — Encounter (HOSPITAL_COMMUNITY): Payer: Self-pay | Admitting: Radiology

## 2022-01-22 ENCOUNTER — Encounter (HOSPITAL_COMMUNITY): Payer: Self-pay

## 2022-01-22 ENCOUNTER — Other Ambulatory Visit: Payer: Self-pay

## 2022-01-22 ENCOUNTER — Emergency Department (HOSPITAL_COMMUNITY): Payer: Medicaid Other

## 2022-01-22 ENCOUNTER — Inpatient Hospital Stay (HOSPITAL_COMMUNITY)
Admission: EM | Admit: 2022-01-22 | Discharge: 2022-01-26 | DRG: 193 | Disposition: A | Discharge status: Home / Self Care | Payer: Medicaid Other | Attending: Family Medicine | Admitting: Family Medicine

## 2022-01-22 DIAGNOSIS — Z9981 Dependence on supplemental oxygen: Secondary | ICD-10-CM

## 2022-01-22 DIAGNOSIS — E1165 Type 2 diabetes mellitus with hyperglycemia: Secondary | ICD-10-CM | POA: Diagnosis present

## 2022-01-22 DIAGNOSIS — Z833 Family history of diabetes mellitus: Secondary | ICD-10-CM | POA: Diagnosis not present

## 2022-01-22 DIAGNOSIS — J441 Chronic obstructive pulmonary disease with (acute) exacerbation: Secondary | ICD-10-CM | POA: Diagnosis present

## 2022-01-22 DIAGNOSIS — G894 Chronic pain syndrome: Secondary | ICD-10-CM | POA: Diagnosis present

## 2022-01-22 DIAGNOSIS — J9621 Acute and chronic respiratory failure with hypoxia: Secondary | ICD-10-CM | POA: Diagnosis present

## 2022-01-22 DIAGNOSIS — E785 Hyperlipidemia, unspecified: Secondary | ICD-10-CM | POA: Diagnosis present

## 2022-01-22 DIAGNOSIS — J9622 Acute and chronic respiratory failure with hypercapnia: Secondary | ICD-10-CM | POA: Diagnosis present

## 2022-01-22 DIAGNOSIS — J449 Chronic obstructive pulmonary disease, unspecified: Secondary | ICD-10-CM | POA: Diagnosis not present

## 2022-01-22 DIAGNOSIS — Z7984 Long term (current) use of oral hypoglycemic drugs: Secondary | ICD-10-CM | POA: Diagnosis not present

## 2022-01-22 DIAGNOSIS — Z8249 Family history of ischemic heart disease and other diseases of the circulatory system: Secondary | ICD-10-CM

## 2022-01-22 DIAGNOSIS — E871 Hypo-osmolality and hyponatremia: Secondary | ICD-10-CM | POA: Diagnosis present

## 2022-01-22 DIAGNOSIS — J9601 Acute respiratory failure with hypoxia: Secondary | ICD-10-CM | POA: Diagnosis present

## 2022-01-22 DIAGNOSIS — J440 Chronic obstructive pulmonary disease with acute lower respiratory infection: Secondary | ICD-10-CM | POA: Diagnosis present

## 2022-01-22 DIAGNOSIS — K219 Gastro-esophageal reflux disease without esophagitis: Secondary | ICD-10-CM | POA: Diagnosis present

## 2022-01-22 DIAGNOSIS — J9602 Acute respiratory failure with hypercapnia: Secondary | ICD-10-CM | POA: Diagnosis present

## 2022-01-22 DIAGNOSIS — Z880 Allergy status to penicillin: Secondary | ICD-10-CM

## 2022-01-22 DIAGNOSIS — Z79899 Other long term (current) drug therapy: Secondary | ICD-10-CM | POA: Diagnosis not present

## 2022-01-22 DIAGNOSIS — Z20822 Contact with and (suspected) exposure to covid-19: Secondary | ICD-10-CM | POA: Diagnosis present

## 2022-01-22 DIAGNOSIS — F17210 Nicotine dependence, cigarettes, uncomplicated: Secondary | ICD-10-CM | POA: Diagnosis present

## 2022-01-22 DIAGNOSIS — Z885 Allergy status to narcotic agent status: Secondary | ICD-10-CM

## 2022-01-22 DIAGNOSIS — F419 Anxiety disorder, unspecified: Secondary | ICD-10-CM | POA: Diagnosis present

## 2022-01-22 DIAGNOSIS — Z791 Long term (current) use of non-steroidal anti-inflammatories (NSAID): Secondary | ICD-10-CM | POA: Diagnosis not present

## 2022-01-22 DIAGNOSIS — Z794 Long term (current) use of insulin: Secondary | ICD-10-CM | POA: Diagnosis not present

## 2022-01-22 DIAGNOSIS — T380X5A Adverse effect of glucocorticoids and synthetic analogues, initial encounter: Secondary | ICD-10-CM | POA: Diagnosis present

## 2022-01-22 DIAGNOSIS — Z881 Allergy status to other antibiotic agents status: Secondary | ICD-10-CM | POA: Diagnosis not present

## 2022-01-22 DIAGNOSIS — I10 Essential (primary) hypertension: Secondary | ICD-10-CM | POA: Diagnosis present

## 2022-01-22 DIAGNOSIS — J9620 Acute and chronic respiratory failure, unspecified whether with hypoxia or hypercapnia: Secondary | ICD-10-CM | POA: Diagnosis present

## 2022-01-22 DIAGNOSIS — J189 Pneumonia, unspecified organism: Secondary | ICD-10-CM | POA: Diagnosis present

## 2022-01-22 DIAGNOSIS — A419 Sepsis, unspecified organism: Secondary | ICD-10-CM

## 2022-01-22 DIAGNOSIS — E119 Type 2 diabetes mellitus without complications: Secondary | ICD-10-CM

## 2022-01-22 LAB — RESP PANEL BY RT-PCR (FLU A&B, COVID) ARPGX2
Influenza A by PCR: NEGATIVE
Influenza B by PCR: NEGATIVE
SARS Coronavirus 2 by RT PCR: NEGATIVE

## 2022-01-22 LAB — COMPREHENSIVE METABOLIC PANEL
ALT: 25 U/L (ref 0–44)
AST: 30 U/L (ref 15–41)
Albumin: 3.2 g/dL — ABNORMAL LOW (ref 3.5–5.0)
Alkaline Phosphatase: 113 U/L (ref 38–126)
Anion gap: 9 (ref 5–15)
BUN: 10 mg/dL (ref 6–20)
CO2: 36 mmol/L — ABNORMAL HIGH (ref 22–32)
Calcium: 8.4 mg/dL — ABNORMAL LOW (ref 8.9–10.3)
Chloride: 86 mmol/L — ABNORMAL LOW (ref 98–111)
Creatinine, Ser: 0.49 mg/dL (ref 0.44–1.00)
GFR, Estimated: >60 mL/min (ref >60)
Glucose, Bld: 328 mg/dL — ABNORMAL HIGH (ref 70–99)
Potassium: 3.7 mmol/L (ref 3.5–5.1)
Sodium: 131 mmol/L — ABNORMAL LOW (ref 135–145)
Total Bilirubin: 0.4 mg/dL (ref 0.3–1.2)
Total Protein: 7.7 g/dL (ref 6.5–8.1)

## 2022-01-22 LAB — CBC WITH DIFFERENTIAL/PLATELET
Abs Immature Granulocytes: 0.12 10*3/uL — ABNORMAL HIGH (ref 0.00–0.07)
Basophils Absolute: 0.1 10*3/uL (ref 0.0–0.1)
Basophils Relative: 0 %
Eosinophils Absolute: 0 10*3/uL (ref 0.0–0.5)
Eosinophils Relative: 0 %
HCT: 36.9 % (ref 36.0–46.0)
Hemoglobin: 11.5 g/dL — ABNORMAL LOW (ref 12.0–15.0)
Immature Granulocytes: 1 %
Lymphocytes Relative: 5 %
Lymphs Abs: 1 10*3/uL (ref 0.7–4.0)
MCH: 26.7 pg (ref 26.0–34.0)
MCHC: 31.2 g/dL (ref 30.0–36.0)
MCV: 85.8 fL (ref 80.0–100.0)
Monocytes Absolute: 1.7 10*3/uL — ABNORMAL HIGH (ref 0.1–1.0)
Monocytes Relative: 8 %
Neutro Abs: 17.4 10*3/uL — ABNORMAL HIGH (ref 1.7–7.7)
Neutrophils Relative %: 86 %
Platelets: 389 10*3/uL (ref 150–400)
RBC: 4.3 MIL/uL (ref 3.87–5.11)
RDW: 12.6 % (ref 11.5–15.5)
WBC: 20.3 10*3/uL — ABNORMAL HIGH (ref 4.0–10.5)
nRBC: 0 % (ref 0.0–0.2)

## 2022-01-22 LAB — BLOOD GAS, VENOUS
Acid-Base Excess: 19.4 mmol/L — ABNORMAL HIGH (ref 0.0–2.0)
Bicarbonate: 48.3 mmol/L — ABNORMAL HIGH (ref 20.0–28.0)
FIO2: 44 %
O2 Saturation: 83.4 %
Patient temperature: 38.1
pCO2, Ven: 82 mmHg (ref 44–60)
pH, Ven: 7.38 (ref 7.25–7.43)
pO2, Ven: 55 mmHg — ABNORMAL HIGH (ref 32–45)

## 2022-01-22 LAB — TROPONIN I (HIGH SENSITIVITY)
Troponin I (High Sensitivity): 5 ng/L (ref <18)
Troponin I (High Sensitivity): 6 ng/L (ref <18)

## 2022-01-22 LAB — PROCALCITONIN: Procalcitonin: 0.52 ng/mL

## 2022-01-22 LAB — LACTIC ACID, PLASMA
Lactic Acid, Venous: 0.9 mmol/L (ref 0.5–1.9)
Lactic Acid, Venous: 0.9 mmol/L (ref 0.5–1.9)

## 2022-01-22 MED ORDER — HYDROMORPHONE HCL 1 MG/ML IJ SOLN
0.5000 mg | Freq: Once | INTRAMUSCULAR | Status: AC
  Administered 2022-01-22: 0.5 mg via INTRAVENOUS
  Filled 2022-01-22: qty 1

## 2022-01-22 MED ORDER — ALBUTEROL (5 MG/ML) CONTINUOUS INHALATION SOLN
5.0000 mg/h | INHALATION_SOLUTION | Freq: Once | RESPIRATORY_TRACT | Status: AC
  Administered 2022-01-22: 5 mg/h via RESPIRATORY_TRACT

## 2022-01-22 MED ORDER — ROSUVASTATIN CALCIUM 20 MG PO TABS
20.0000 mg | ORAL_TABLET | Freq: Every day | ORAL | Status: DC
  Administered 2022-01-23 – 2022-01-26 (×4): 20 mg via ORAL
  Filled 2022-01-22 (×4): qty 1

## 2022-01-22 MED ORDER — SODIUM CHLORIDE 0.9 % IV SOLN
2.0000 g | INTRAVENOUS | Status: DC
  Administered 2022-01-23 – 2022-01-25 (×3): 2 g via INTRAVENOUS
  Filled 2022-01-22 (×3): qty 20

## 2022-01-22 MED ORDER — SODIUM CHLORIDE 0.9 % IV BOLUS
1000.0000 mL | Freq: Once | INTRAVENOUS | Status: AC
  Administered 2022-01-22: 1000 mL via INTRAVENOUS

## 2022-01-22 MED ORDER — FLUTICASONE PROPIONATE 50 MCG/ACT NA SUSP
2.0000 | Freq: Every day | NASAL | Status: DC
  Administered 2022-01-23 – 2022-01-26 (×4): 2 via NASAL
  Filled 2022-01-22: qty 16

## 2022-01-22 MED ORDER — IPRATROPIUM-ALBUTEROL 0.5-2.5 (3) MG/3ML IN SOLN
3.0000 mL | Freq: Four times a day (QID) | RESPIRATORY_TRACT | Status: DC
  Administered 2022-01-22 – 2022-01-23 (×4): 3 mL via RESPIRATORY_TRACT
  Filled 2022-01-22 (×4): qty 3

## 2022-01-22 MED ORDER — ONDANSETRON HCL 4 MG/2ML IJ SOLN
4.0000 mg | Freq: Once | INTRAMUSCULAR | Status: AC
  Administered 2022-01-22: 4 mg via INTRAVENOUS
  Filled 2022-01-22: qty 2

## 2022-01-22 MED ORDER — ACETAMINOPHEN 325 MG PO TABS
650.0000 mg | ORAL_TABLET | Freq: Four times a day (QID) | ORAL | Status: DC | PRN
  Administered 2022-01-25: 650 mg via ORAL
  Filled 2022-01-22: qty 2

## 2022-01-22 MED ORDER — ENOXAPARIN SODIUM 40 MG/0.4ML IJ SOSY
40.0000 mg | PREFILLED_SYRINGE | INTRAMUSCULAR | Status: DC
  Administered 2022-01-22 – 2022-01-25 (×4): 40 mg via SUBCUTANEOUS
  Filled 2022-01-22 (×4): qty 0.4

## 2022-01-22 MED ORDER — PANTOPRAZOLE SODIUM 40 MG PO TBEC
40.0000 mg | DELAYED_RELEASE_TABLET | Freq: Every day | ORAL | Status: DC
  Administered 2022-01-23 – 2022-01-26 (×4): 40 mg via ORAL
  Filled 2022-01-22 (×4): qty 1

## 2022-01-22 MED ORDER — ALBUTEROL SULFATE (2.5 MG/3ML) 0.083% IN NEBU: 2.5000 mg | INHALATION_SOLUTION | RESPIRATORY_TRACT | Status: DC | PRN

## 2022-01-22 MED ORDER — INSULIN GLARGINE-YFGN 100 UNIT/ML ~~LOC~~ SOLN
15.0000 [IU] | Freq: Every day | SUBCUTANEOUS | Status: DC
Start: 2022-01-23 — End: 2022-01-23
  Filled 2022-01-22 (×2): qty 0.15

## 2022-01-22 MED ORDER — METHYLPREDNISOLONE SODIUM SUCC 125 MG IJ SOLR
125.0000 mg | Freq: Once | INTRAMUSCULAR | Status: AC
  Administered 2022-01-22: 125 mg via INTRAVENOUS
  Filled 2022-01-22: qty 2

## 2022-01-22 MED ORDER — METHYLPREDNISOLONE SODIUM SUCC 125 MG IJ SOLR: 125.0000 mg | Freq: Every day | INTRAMUSCULAR | Status: DC

## 2022-01-22 MED ORDER — PREDNISONE 20 MG PO TABS: 40.0000 mg | ORAL_TABLET | Freq: Every day | ORAL | Status: DC

## 2022-01-22 MED ORDER — IRBESARTAN 150 MG PO TABS
150.0000 mg | ORAL_TABLET | Freq: Every day | ORAL | Status: DC
Start: 2022-01-23 — End: 2022-01-26
  Administered 2022-01-23 – 2022-01-26 (×4): 150 mg via ORAL
  Filled 2022-01-22 (×4): qty 1

## 2022-01-22 MED ORDER — SODIUM CHLORIDE 0.9 % IV SOLN
2.0000 g | Freq: Once | INTRAVENOUS | Status: AC
  Administered 2022-01-22: 2 g via INTRAVENOUS
  Filled 2022-01-22: qty 20

## 2022-01-22 MED ORDER — INSULIN GLARGINE 100 UNIT/ML SOLOSTAR PEN: 15.0000 [IU] | PEN_INJECTOR | Freq: Every day | SUBCUTANEOUS | Status: DC

## 2022-01-22 MED ORDER — SUMATRIPTAN SUCCINATE 50 MG PO TABS
50.0000 mg | ORAL_TABLET | ORAL | Status: AC | PRN
  Administered 2022-01-23 – 2022-01-24 (×2): 50 mg via ORAL
  Filled 2022-01-22 (×5): qty 1

## 2022-01-22 MED ORDER — LORATADINE 10 MG PO TABS
10.0000 mg | ORAL_TABLET | Freq: Every day | ORAL | Status: DC
  Administered 2022-01-23 – 2022-01-26 (×4): 10 mg via ORAL
  Filled 2022-01-22 (×4): qty 1

## 2022-01-22 MED ORDER — LORAZEPAM 2 MG/ML IJ SOLN
0.5000 mg | Freq: Once | INTRAMUSCULAR | Status: AC
  Administered 2022-01-22: 0.5 mg via INTRAVENOUS
  Filled 2022-01-22: qty 1

## 2022-01-22 MED ORDER — ACETAMINOPHEN 650 MG RE SUPP: 650.0000 mg | Freq: Four times a day (QID) | RECTAL | Status: DC | PRN

## 2022-01-22 MED ORDER — SODIUM CHLORIDE 0.9 % IV SOLN
500.0000 mg | INTRAVENOUS | Status: DC
  Administered 2022-01-22 – 2022-01-25 (×4): 500 mg via INTRAVENOUS
  Filled 2022-01-22 (×4): qty 5

## 2022-01-22 MED ORDER — OXYCODONE HCL 5 MG PO TABS
5.0000 mg | ORAL_TABLET | Freq: Four times a day (QID) | ORAL | Status: DC | PRN
  Administered 2022-01-22 – 2022-01-26 (×15): 5 mg via ORAL
  Filled 2022-01-22 (×15): qty 1

## 2022-01-22 NOTE — ED Notes (Signed)
Patient asked if bipap mask could be removed so she could blow her nose. Removed the mask and patient states that she doesn't want the mask on and that she just wanted to wear oxygen.  Explained that bipap was helping her and patient states she doesn't want to put it on.  Told I would advise the doctor. ?

## 2022-01-22 NOTE — Progress Notes (Signed)
Checked patient.  BS slight wheeze with some Rhonchi.  Patient seems to be tolerating Bipap well however, complaints of not being able to cough up secretions well.  Will show patient how to take off mask when she needs to excrete secretions. ?

## 2022-01-22 NOTE — Progress Notes (Signed)
Patient took herself off Bipap and back on [place] 5L with sat of 98%. ?

## 2022-01-22 NOTE — Progress Notes (Signed)
Patient declined to go back on BIPAP for the night. Unit still at bedside if needed or if patient changes her mind.  ?

## 2022-01-22 NOTE — ED Triage Notes (Signed)
Patient brought in by [facility] for shortness of breath.  Patient states coughing up green sputum.  Patient received 1 albuterol tx.  Patient oxygen sats between 91-95 on [place].  CBG 347.  ?

## 2022-01-22 NOTE — H&P (Addendum)
TRH H&P   Patient Demographics:    [name], is a 56 y.o. female  MRN: [id number]   DOB - 01-07-1966  Admit Date - 01/22/2022  Outpatient Primary MD for the patient is [facility]  Referring MD/NP/PA: PA Faulkner    Patient coming from: Home  Chief Complaint  Patient presents with   Shortness of Breath      HPI:    [name]  is a 56 y.o. female, with a history of COPD, chronic respiratory failure on 3 L, hypertension, chronic pain syndrome, diabetes mellitus type 2, anxiety, tobacco abuse presenting with shortness of breath, and cough, patient reports worsening dyspnea over the last week, as well she reports cough, with productive yellow phlegm, as well as report worsening wheezing and started to develop some chest discomfort, related to cough,, as well she does report some nausea, vomiting, started couple days ago, reports as well poor appetite, as well she does report a granddaughter has been having some sickness recently and she thinks she might got it from here, she tried to increase her oxygen level without much help with her dyspnea which prompted her to come to ED -In ED patient was with increased work of breathing on presentation, requiring 6 L oxygen, her ABG showing PCO2 of 82, where she started to not BiPAP, her chest x-ray significant for multifocal opacity, cell count elevated at 20, sodium at 131, glucose elevated at 328, patient started on steroids, antibiotics and [facility] consulted to admit.    Review of systems:    In addition to the HPI above,   A full 10 point Review of Systems was done, except as stated above, all other Review of Systems were negative.   With Past History of the following :    Past Medical History:  Diagnosis Date   Angina    Anxiety state 10/15/2015   ARDS (adult respiratory distress syndrome) ([place])    Jan  2011   Asthma    Chronic back pain    COPD (chronic obstructive pulmonary disease) (HCC)    Diabetes mellitus    GERD (gastroesophageal reflux disease) 12/21/2012   HTN (hypertension) 10/15/2015   Hyperglycemia, drug-induced    steroid induced hyperglycemia   Migraine headache    On home O2    Pneumonia    Recurrent upper respiratory infection (URI)    Shortness of breath       Past Surgical History:  Procedure Laterality Date   c-section     TRACHEOSTOMY     decannulated 12/2009   TUBAL LIGATION     uterine ablasion        Social History:     Social History   Tobacco Use   Smoking status: Every Day    Packs/day: 0.50    Years: 42.00    Pack years: 21.00    Types: Cigarettes  Smokeless tobacco: Never  Substance Use Topics   Alcohol use: No         Family History :     Family History  Problem Relation Age of Onset   Coronary artery disease Brother    Diabetes Other    Cancer Other    Hypertension Other      Home Medications:   Prior to Admission medications   Medication Sig Start Date End Date Taking? Authorizing Provider  acetaminophen (TYLENOL) 500 MG tablet Take 500 mg by mouth every 6 (six) hours as needed for fever.    Yes [provider]  albuterol (VENTOLIN HFA) 108 (90 Base) MCG/ACT inhaler Inhale 2 puffs into the lungs every 4 (four) hours as needed for wheezing or shortness of breath. 08/10/20  Yes [provider]  celecoxib (CELEBREX) 100 MG capsule Take 100 mg by mouth 2 (two) times daily. 11/20/21  Yes [provider]  fluticasone (FLONASE) 50 MCG/ACT nasal spray Place 2 sprays into both nostrils daily. 04/13/18  Yes [provider]  gabapentin (NEURONTIN) 300 MG capsule Take 300 mg by mouth 3 (three) times daily. 07/31/21  Yes [provider]  Insulin Glargine (LANTUS) 100 UNIT/ML Solostar Pen Inject 15 Units into the skin daily. 12/02/19  Yes Johnson, Clanford L, MD  ipratropium-albuterol (DUONEB)  0.5-2.5 (3) MG/3ML SOLN Take 3 mLs by nebulization See admin instructions. Use 1 vial  every 6 to 8 hours as needed for shortness of breath 10/31/21  Yes [provider]  irbesartan (AVAPRO) 150 MG tablet TAKE 1 TABLET BY MOUTH EVERY DAY 05/29/21  Yes Nyoka Cowden, MD  loratadine (CLARITIN) 10 MG tablet Take 10 mg by mouth daily. 08/13/21  Yes [provider]  metFORMIN (GLUCOPHAGE) 500 MG tablet Take 2 tablets (1,000 mg total) by mouth 2 (two) times daily. 01/04/20  Yes Corum, Minerva Fester, MD  methocarbamol (ROBAXIN) 500 MG tablet Take 500 mg by mouth every 8 (eight) hours as needed. 08/13/21  Yes [provider]  ondansetron (ZOFRAN-ODT) 4 MG disintegrating tablet Take by mouth. 01/21/22  Yes [provider]  oxyCODONE (OXY IR/ROXICODONE) 5 MG immediate release tablet Take 5 mg by mouth 4 (four) times daily. 11/20/21  Yes [provider]  pantoprazole (PROTONIX) 40 MG tablet Take 1 tablet (40 mg total) by mouth daily. 03/11/20  Yes Corum, Minerva Fester, MD  rosuvastatin (CRESTOR) 20 MG tablet Take 20 mg by mouth daily. 06/02/21  Yes [provider]  SUMAtriptan (IMITREX) 50 MG tablet Take 50 mg by mouth as needed. 11/29/21  Yes [provider]  albuterol (PROVENTIL) (2.5 MG/3ML) 0.083% nebulizer solution Take 3 mLs (2.5 mg total) by nebulization every 6 (six) hours as needed for wheezing or shortness of breath. Patient not taking: Reported on 01/22/2022 09/27/20   Bethann Berkshire, MD  azithromycin (ZITHROMAX) 500 MG tablet Take 1 tablet (500 mg total) by mouth daily. Patient not taking: Reported on 01/22/2022 08/19/21   Catarina Hartshorn, MD  cefdinir (OMNICEF) 300 MG capsule Take 1 capsule (300 mg total) by mouth 2 (two) times daily. Patient not taking: Reported on 01/22/2022 08/19/21   Catarina Hartshorn, MD  glucose blood test strip 1 each by Other route 3 (three) times daily. Use as instructed, accucheck test strips 01/04/20   Corum, Minerva Fester, MD  Insulin Pen Needle 31G X 5 MM  MISC Use as directed 03/12/20   Wandra Feinstein, MD  predniSONE (DELTASONE) 10 MG tablet Take 6 tablets (60 mg  total) by mouth daily with breakfast. And decrease by one tablet daily Patient not taking: Reported on 01/22/2022 08/20/21   Orson Eva, MD  ARIPiprazole (ABILIFY) 5 MG tablet Take 5 mg by mouth daily.    02/03/12  [provider]  venlafaxine (EFFEXOR) 75 MG tablet Take 75 mg by mouth daily.    02/03/12  [provider]     Allergies:     Allergies  Allergen Reactions   Levofloxacin Anaphylaxis   Levofloxacin In D5w Itching, Swelling and Rash    IV only IV only   Penicillins Other (See Comments)    convulsions   Doxycycline Nausea And Vomiting   Morphine Nausea Only     Physical Exam:   Vitals  Blood pressure 121/67, pulse (!) 107, temperature (!) 100.7 F (38.2 C), temperature source Oral, resp. rate (!) 21, height 5\' 1"  (1.549 m), weight 59 kg, SpO2 99 %.   1. General frail female, appears older than stated age, in mild respiratory distress, on BiPAP  2. Normal affect and insight, Not Suicidal or Homicidal, Awake Alert, Oriented X 3.  3. No F.N deficits, ALL C.Nerves Intact, Strength 5/5 all 4 extremities, Sensation intact all 4 extremities, Plantars down going.  4. Ears and Eyes appear Normal, Conjunctivae clear, PERRLA. Moist Oral Mucosa.  5. Supple Neck, No JVD, No cervical lymphadenopathy appriciated, No Carotid Bruits.  6. Symmetrical Chest wall movement, diffuse wheezing bilaterally with scattered rhonchi,  7. RRR, No Gallops, Rubs or Murmurs, No Parasternal Heave.  8. Positive Bowel Sounds, Abdomen Soft, No tenderness, No organomegaly appriciated,No rebound -guarding or rigidity.  9.  No Cyanosis, Normal Skin Turgor, No Skin Rash or Bruise.  10. Good muscle tone,  joints appear normal , no effusions, Normal ROM.  11. No Palpable Lymph Nodes in Neck or Axillae     Data Review:    CBC Recent Labs  Lab 01/22/22 1633  WBC 20.3*   HGB 11.5*  HCT 36.9  PLT 389  MCV 85.8  MCH 26.7  MCHC 31.2  RDW 12.6  LYMPHSABS 1.0  MONOABS 1.7*  EOSABS 0.0  BASOSABS 0.1   ------------------------------------------------------------------------------------------------------------------  Chemistries  Recent Labs  Lab 01/22/22 1633  NA 131*  K 3.7  CL 86*  CO2 36*  GLUCOSE 328*  BUN 10  CREATININE 0.49  CALCIUM 8.4*  AST 30  ALT 25  ALKPHOS 113  BILITOT 0.4   ------------------------------------------------------------------------------------------------------------------ estimated creatinine clearance is 67.1 mL/min (by C-G formula based on SCr of 0.49 mg/dL). ------------------------------------------------------------------------------------------------------------------ No results for input(s): TSH, T4TOTAL, T3FREE, THYROIDAB in the last 72 hours.  Invalid input(s): FREET3  Coagulation profile No results for input(s): INR, PROTIME in the last 168 hours. ------------------------------------------------------------------------------------------------------------------- No results for input(s): DDIMER in the last 72 hours. -------------------------------------------------------------------------------------------------------------------  Cardiac Enzymes No results for input(s): CKMB, TROPONINI, MYOGLOBIN in the last 168 hours.  Invalid input(s): CK ------------------------------------------------------------------------------------------------------------------    Component Value Date/Time   BNP 48.0 04/21/2020 1450     ---------------------------------------------------------------------------------------------------------------  Urinalysis    Component Value Date/Time   COLORURINE YELLOW 04/21/2020 1430   APPEARANCEUR CLEAR 04/21/2020 1430   LABSPEC 1.014 04/21/2020 1430   PHURINE 5.0 04/21/2020 1430   GLUCOSEU >=500 (A) 04/21/2020 1430   HGBUR SMALL (A) 04/21/2020 1430   BILIRUBINUR NEGATIVE  04/21/2020 1430   KETONESUR NEGATIVE 04/21/2020 1430   PROTEINUR NEGATIVE 04/21/2020 1430   UROBILINOGEN 1.0 07/24/2014 1605   NITRITE NEGATIVE 04/21/2020 1430   LEUKOCYTESUR NEGATIVE 04/21/2020 1430    ----------------------------------------------------------------------------------------------------------------  Imaging Results:    DG Chest Port 1 View  Result Date: 01/22/2022 CLINICAL DATA:  Productive cough.  Hypoxemia. EXAM: PORTABLE CHEST 1 VIEW COMPARISON:  08/16/2021 FINDINGS: Diffuse bilateral nodular airspace disease throughout both lungs similar to the prior study. No confluent consolidation. Small bilateral effusions unchanged. Negative for heart failure. IMPRESSION: No change in bilateral nodular airspace disease which may be due to infection. Small bilateral effusions. Electronically Signed   By: Franchot Gallo M.D.   On: 01/22/2022 16:29    My personal review of EKG: Rhythm Sinus tachycardia, Rate  121 /min, QTc 470    Assessment & Plan:    Principal Problem:   Acute hypercapnic respiratory failure (HCC) Active Problems:   Acute-on-chronic respiratory failure (HCC)   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    COPD exacerbation (HCC)   CAP (community acquired pneumonia)   DM (diabetes mellitus) ([place])   Cigarette smoker   GERD (gastroesophageal reflux disease)   Acute hypercapnic respiratory failure Acute on chronic hypoxic respiratory failure Multifocal pneumonia COPD exacerbation -Presents with progressive dyspnea, cough and productive green phlegm, as well she is with significant wheezing upon presentation and increased work of breathing, she is with elevated PCO2 at 82, baseline oxygen 2 to 3 L, she was requiring 6 L upon presentation to ED -With BiPAP for now, will keep as needed and nightly -Continue with IV Rocephin and azithromycin -Encouraged use incentive spirometry and flutter valve once off BiPAP -Follow sputum culture, Legionella and strep  antigen -Continue with IV Solu-Medrol -Continue with scheduled DuoNebs and as needed albuterol   Chronic pain syndrome  -Continue with as needed oxycodone  Type 2 diabetes mellitus, insulin-dependent -Check A1c, continue with Lantus, will add insulin sliding scale  Hypertension -Continue with home medications  GERD -Continue with PPI  Tobacco abuse -She was counseled  Hyperlipidemia -Continue with statin  Hyponatremia -Continue to monitor  DVT Prophylaxis   Lovenox   AM Labs Ordered, also please review Full Orders  Family Communication: Admission, patients condition and plan of care including tests being ordered have been discussed with the patient  who indicate understanding and agree with the plan and Code Status.  Code Status Full  Likely DC to  Home  Condition GUARDED    Consults called: none    Admission status: inpatient    Time spent in minutes : 70 minutes   Phillips Climes M.D on 01/22/2022 at 7:49 PM   [facility] - Office  [phone]

## 2022-01-22 NOTE — ED Notes (Addendum)
Critical Lab: PcO2 82, provider made aware.  ?

## 2022-01-22 NOTE — ED Provider Notes (Signed)
[facility] EMERGENCY DEPARTMENT Provider Note   CSN: [id number] Arrival date & time: 01/22/22  1537     History  Chief Complaint  Patient presents with   Shortness of Breath    [name] is a 56 y.o. female.  HPI  Patient with medical history including COPD, chronic respiratory failure on 3 L via nasal cannula hypertension chronic pain diabetes type 2 presents  with complaints of shortness of breath.  She says she is having worsening shortness of breath over the last week, states that she is having worsening sputum production and states it is yellow in nature,  having worsening wheezing and started to develop some chest pain,  chest pain is in the middle of her chest occurs after coughing, chest pain does not radiate, not worsened with exertion, nonpleuritic.  She also notes that she is having some nausea and vomiting  started a couple days, states she has no appetite, states that her granddaughter who was over was recently sick and is concerned that she might got it from her.  She states that she has had to increase her oxygen levels, she has also been using her at home breathing treatments not much success.  I reviewed patient's chart was recently admitted back in September last year for similar presentation, admitted for sepsis with lower lobe pneumonia started on ceftriaxone azithromycin and was later discharged home.  Home Medications Prior to Admission medications   Medication Sig Start Date End Date Taking? Authorizing Provider  acetaminophen (TYLENOL) 500 MG tablet Take 500 mg by mouth every 6 (six) hours as needed for fever.    Yes [provider]  albuterol (VENTOLIN HFA) 108 (90 Base) MCG/ACT inhaler Inhale 2 puffs into the lungs every 4 (four) hours as needed for wheezing or shortness of breath. 08/10/20  Yes [provider]  celecoxib (CELEBREX) 100 MG capsule Take 100 mg by mouth 2 (two) times daily. 11/20/21  Yes [provider]  fluticasone  (FLONASE) 50 MCG/ACT nasal spray Place 2 sprays into both nostrils daily. 04/13/18  Yes [provider]  gabapentin (NEURONTIN) 300 MG capsule Take 300 mg by mouth 3 (three) times daily. 07/31/21  Yes [provider]  Insulin Glargine (LANTUS) 100 UNIT/ML Solostar Pen Inject 15 Units into the skin daily. 12/02/19  Yes Johnson, Clanford L, MD  ipratropium-albuterol (DUONEB) 0.5-2.5 (3) MG/3ML SOLN Take 3 mLs by nebulization See admin instructions. Use 1 vial  every 6 to 8 hours as needed for shortness of breath 10/31/21  Yes [provider]  irbesartan (AVAPRO) 150 MG tablet TAKE 1 TABLET BY MOUTH EVERY DAY 05/29/21  Yes Nyoka Cowden, MD  loratadine (CLARITIN) 10 MG tablet Take 10 mg by mouth daily. 08/13/21  Yes [provider]  metFORMIN (GLUCOPHAGE) 500 MG tablet Take 2 tablets (1,000 mg total) by mouth 2 (two) times daily. 01/04/20  Yes Corum, Minerva Fester, MD  methocarbamol (ROBAXIN) 500 MG tablet Take 500 mg by mouth every 8 (eight) hours as needed. 08/13/21  Yes [provider]  ondansetron (ZOFRAN-ODT) 4 MG disintegrating tablet Take by mouth. 01/21/22  Yes [provider]  oxyCODONE (OXY IR/ROXICODONE) 5 MG immediate release tablet Take 5 mg by mouth 4 (four) times daily. 11/20/21  Yes [provider]  pantoprazole (PROTONIX) 40 MG tablet Take 1 tablet (40 mg total) by mouth daily. 03/11/20  Yes Corum, Minerva Fester, MD  rosuvastatin (CRESTOR) 20 MG tablet Take 20 mg by mouth daily. 06/02/21  Yes  [provider]  SUMAtriptan (IMITREX) 50 MG tablet Take 50 mg by mouth as needed. 11/29/21  Yes [provider]  albuterol (PROVENTIL) (2.5 MG/3ML) 0.083% nebulizer solution Take 3 mLs (2.5 mg total) by nebulization every 6 (six) hours as needed for wheezing or shortness of breath. Patient not taking: Reported on 01/22/2022 09/27/20   Bethann BerkshireZammit, Joseph, MD  azithromycin (ZITHROMAX) 500 MG tablet Take 1 tablet (500 mg total) by mouth daily. Patient not  taking: Reported on 01/22/2022 08/19/21   Catarina Hartshornat, David, MD  cefdinir (OMNICEF) 300 MG capsule Take 1 capsule (300 mg total) by mouth 2 (two) times daily. Patient not taking: Reported on 01/22/2022 08/19/21   Catarina Hartshornat, David, MD  glucose blood test strip 1 each by Other route 3 (three) times daily. Use as instructed, accucheck test strips 01/04/20   Corum, Minerva FesterLisa L, MD  Insulin Pen Needle 31G X 5 MM MISC Use as directed 03/12/20   Wandra Feinsteinorum, Lisa L, MD  predniSONE (DELTASONE) 10 MG tablet Take 6 tablets (60 mg total) by mouth daily with breakfast. And decrease by one tablet daily Patient not taking: Reported on 01/22/2022 08/20/21   Catarina Hartshornat, David, MD  ARIPiprazole (ABILIFY) 5 MG tablet Take 5 mg by mouth daily.    02/03/12  [provider]  venlafaxine (EFFEXOR) 75 MG tablet Take 75 mg by mouth daily.    02/03/12  [provider]      Allergies    Levofloxacin, Levofloxacin in d5w, Penicillins, Doxycycline, and Morphine    Review of Systems   Review of Systems  Constitutional:  Positive for chills and fever.  Respiratory:  Positive for cough and shortness of breath.   Cardiovascular:  Negative for chest pain.  Gastrointestinal:  Positive for nausea and vomiting. Negative for abdominal pain.  Neurological:  Negative for headaches.   Physical Exam Updated Vital Signs BP (!) 123/59    Pulse (!) 113    Temp (!) 100.7 F (38.2 C) (Oral)    Resp 19    Ht 5\' 1"  (1.549 m)    Wt 59 kg    SpO2 99%    BMI 24.56 kg/m  Physical Exam Vitals and nursing note reviewed.  Constitutional:      General: She is in acute distress.     Appearance: She is not ill-appearing.     Comments: Chronically ill-appearing  HENT:     Head: Normocephalic and atraumatic.     Nose: No congestion.     Mouth/Throat:     Mouth: Mucous membranes are dry.     Pharynx: Oropharynx is clear.  Eyes:     Conjunctiva/sclera: Conjunctivae normal.  Cardiovascular:     Rate and Rhythm: Normal rate and regular rhythm.     Pulses: Normal  pulses.     Heart sounds: No murmur heard.   No friction rub. No gallop.  Pulmonary:     Effort: No respiratory distress.     Breath sounds: No wheezing, rhonchi or rales.     Comments: Patient pain and respiratory distress she is tachypneic only speaking 4-5 sentences, requiring 6 L via nasal cannula, lung sounds were tight sounding wheezing heard in the lower lobes bilaterally without rales or rhonchi present. Abdominal:     Palpations: Abdomen is soft.     Tenderness: There is no abdominal tenderness. There is no right CVA tenderness or left CVA tenderness.  Musculoskeletal:     Right lower leg: No edema.     Left lower leg:  No edema.  Skin:    General: Skin is warm and dry.  Neurological:     Mental Status: She is alert.  Psychiatric:        Mood and Affect: Mood normal.    ED Results / Procedures / Treatments   Labs (all labs ordered are listed, but only abnormal results are displayed) Labs Reviewed  COMPREHENSIVE METABOLIC PANEL - Abnormal; Notable for the following components:      Result Value   Sodium 131 (*)    Chloride 86 (*)    CO2 36 (*)    Glucose, Bld 328 (*)    Calcium 8.4 (*)    Albumin 3.2 (*)    All other components within normal limits  CBC WITH DIFFERENTIAL/PLATELET - Abnormal; Notable for the following components:   WBC 20.3 (*)    Hemoglobin 11.5 (*)    Neutro Abs 17.4 (*)    Monocytes Absolute 1.7 (*)    Abs Immature Granulocytes 0.12 (*)    All other components within normal limits  BLOOD GAS, VENOUS - Abnormal; Notable for the following components:   pCO2, Ven 82 (*)    pO2, Ven 55 (*)    Bicarbonate 48.3 (*)    Acid-Base Excess 19.4 (*)    All other components within normal limits  RESP PANEL BY RT-PCR (FLU A&B, COVID) ARPGX2  CULTURE, BLOOD (ROUTINE X 2)  CULTURE, BLOOD (ROUTINE X 2)  LACTIC ACID, PLASMA  LACTIC ACID, PLASMA  URINALYSIS, ROUTINE W REFLEX MICROSCOPIC  PROCALCITONIN  PROCALCITONIN  TROPONIN I (HIGH SENSITIVITY)   TROPONIN I (HIGH SENSITIVITY)    EKG None  Radiology DG Chest Port 1 View  Result Date: 01/22/2022 CLINICAL DATA:  Productive cough.  Hypoxemia. EXAM: PORTABLE CHEST 1 VIEW COMPARISON:  08/16/2021 FINDINGS: Diffuse bilateral nodular airspace disease throughout both lungs similar to the prior study. No confluent consolidation. Small bilateral effusions unchanged. Negative for heart failure. IMPRESSION: No change in bilateral nodular airspace disease which may be due to infection. Small bilateral effusions. Electronically Signed   By: Marlan Palau M.D.   On: 01/22/2022 16:29    Procedures .Critical Care Performed by: Carroll Sage, PA-C Authorized by: Carroll Sage, PA-C   Critical care provider statement:    Critical care time (minutes):  60   Critical care time was exclusive of:  Separately billable procedures and treating other patients   Critical care was necessary to treat or prevent imminent or life-threatening deterioration of the following conditions:  Sepsis and respiratory failure   Critical care was time spent personally by me on the following activities:  Development of treatment plan with patient or surrogate, discussions with consultants, evaluation of patient's response to treatment, examination of patient, ordering and review of laboratory studies, ordering and review of radiographic studies, ordering and performing treatments and interventions, pulse oximetry, re-evaluation of patient's condition and review of old charts   I assumed direction of critical care for this patient from another provider in my specialty: no     Care discussed with: admitting provider      Medications Ordered in ED Medications  LORazepam (ATIVAN) injection 0.5 mg (has no administration in time range)  methylPREDNISolone sodium succinate (SOLU-MEDROL) 125 mg/2 mL injection 125 mg (125 mg Intravenous Given 01/22/22 1643)  albuterol (PROVENTIL,VENTOLIN) solution continuous neb (5 mg/hr  Nebulization Given 01/22/22 1658)  cefTRIAXone (ROCEPHIN) 2 g in sodium chloride 0.9 % 100 mL IVPB (0 g Intravenous Stopped 01/22/22 1720)  HYDROmorphone (DILAUDID) injection  0.5 mg (0.5 mg Intravenous Given 01/22/22 1716)  ondansetron (ZOFRAN) injection 4 mg (4 mg Intravenous Given 01/22/22 1713)  sodium chloride 0.9 % bolus 1,000 mL (1,000 mLs Intravenous New Bag/Given 01/22/22 1748)    ED Course/ Medical Decision Making/ A&P                           Medical Decision Making Amount and/or Complexity of Data Reviewed Labs: ordered. Radiology: ordered.  Risk Prescription drug management.   This patient presents to the ED for concern of shortness of breath, this involves an extensive number of treatment options, and is a complaint that carries with it a high risk of complications and morbidity.  The differential diagnosis includes COPD exacerbation, ACS, PE, pneumonia sepsis    Additional history obtained:  Additional history obtained from electronic medical record External records from outside source obtained and reviewed including please see HPI   Co morbidities that complicate the patient evaluation  COPD, tobacco user, chronic respiratory failure  Social Determinants of Health:  N/A    Lab Tests:  I Ordered, and personally interpreted labs.  The pertinent results include: CBC shows leukocytosis of 20.3 hemoglobin 11.5, CMP shows sodium 131 calcium 86 CO2 of 36, glucose 328, venous blood gas shows PCO2 of 82 PO2 55, respiratory panel negative   Imaging Studies ordered:  I ordered imaging studies including chest x-ray I independently visualized and interpreted imaging which showed small bilateral effusions I agree with the radiologist interpretation   Cardiac Monitoring:  The patient was maintained on a cardiac monitor.  I personally viewed and interpreted the cardiac monitored which showed an underlying rhythm of: EKG   Medicines ordered and prescription drug  management:  I ordered medication including fluids, breathing treatment for dehydration, respiratory stress I have reviewed the patients home medicines and have made adjustments as needed  Critical Interventions:  Patient meets sepsis criteria likely upper respiratory we will start her on antibiotics obtain blood cultures continue to monitor. VBG reveals patient's CO2 retaining lung sounds have not proved after continuous neb will place on BiPAP.   Reevaluation:  Presents with shortness of breath she was in acute distress on my exam currently requiring 6 L via nasal cannula will provide her with a continuous neb and reassess.  Patient was reassessed lung sounds after continuous neb still has wheezing on exam and still tachypneic, she is CO2 retaining will place on BiPAP, start her on antibiotics and recommend admission   Patient reassessed while placed on by map she appears to be resting more comfortably vital signs remained stable we will admit patient.  Consultations Obtained:  I requested consultation with the hospitalist Dr. Randol KernElgergawy,  and discussed lab and imaging findings as well as pertinent plan - they recommend: will admit the patient    Test Considered:  N/A    Rule out I have low suspicion for ACS presentation more consistent with upper respiratory illness she has cough congestion febrile with leukocytosis, EKG was sinus rhythm without signs of ischemia, patient had a delta troponin.  Low suspicion for PE presentation atypical more consistent with likely bacterial versus viral upper respiratory illness she has wheezing and a productive cough.  Low suspicion for AAA or aortic dissection as history is atypical, patient has low risk factors.  I have low suspicion for CHF exacerbation as she did not appear volume overloaded my exam no rales present, no peripheral edema present my exam.  Low suspicion  for systemic infection as patient is nontoxic-appearing, vital signs  reassuring, no obvious source infection noted on exam.     Dispostion and problem list  After consideration of the diagnostic results and the patients response to treatment, I feel that the patent would benefit from admission.  Acute respiratory failure-likely secondary due to bacterial versus viral upper respiratory illness, recommend continue steroids, bronchodilators fluids and antibiotics. Sepsis-likely secondary due to upper respiratory illness will recommend continue antibiotics and monitoring.            Final Clinical Impression(s) / ED Diagnoses Final diagnoses:  Acute respiratory failure with hypoxia and hypercapnia (HCC)  Sepsis, due to unspecified organism, unspecified whether acute organ dysfunction present [facility])    Rx / DC Orders ED Discharge Orders     None         Carroll Sage, PA-C 01/22/22 1814    Bethann Berkshire, MD 01/24/22 [phone]

## 2022-01-23 DIAGNOSIS — J441 Chronic obstructive pulmonary disease with (acute) exacerbation: Secondary | ICD-10-CM

## 2022-01-23 DIAGNOSIS — F17210 Nicotine dependence, cigarettes, uncomplicated: Secondary | ICD-10-CM

## 2022-01-23 DIAGNOSIS — E1165 Type 2 diabetes mellitus with hyperglycemia: Secondary | ICD-10-CM

## 2022-01-23 LAB — CBC
HCT: 34.8 % — ABNORMAL LOW (ref 36.0–46.0)
Hemoglobin: 10.5 g/dL — ABNORMAL LOW (ref 12.0–15.0)
MCH: 26.6 pg (ref 26.0–34.0)
MCHC: 30.2 g/dL (ref 30.0–36.0)
MCV: 88.1 fL (ref 80.0–100.0)
Platelets: 370 10*3/uL (ref 150–400)
RBC: 3.95 MIL/uL (ref 3.87–5.11)
RDW: 12.4 % (ref 11.5–15.5)
WBC: 13 10*3/uL — ABNORMAL HIGH (ref 4.0–10.5)
nRBC: 0 % (ref 0.0–0.2)

## 2022-01-23 LAB — HEMOGLOBIN A1C
Hgb A1c MFr Bld: 7.6 % — ABNORMAL HIGH (ref 4.8–5.6)
Mean Plasma Glucose: 171.42 mg/dL

## 2022-01-23 LAB — URINALYSIS, ROUTINE W REFLEX MICROSCOPIC
Bacteria, UA: NONE SEEN
Bilirubin Urine: NEGATIVE
Glucose, UA: >=500 mg/dL — AB
Hgb urine dipstick: NEGATIVE
Ketones, ur: 5 mg/dL — AB
Leukocytes,Ua: NEGATIVE
Nitrite: NEGATIVE
Protein, ur: NEGATIVE mg/dL
Specific Gravity, Urine: 1.022 (ref 1.005–1.030)
pH: 6 (ref 5.0–8.0)

## 2022-01-23 LAB — BASIC METABOLIC PANEL
Anion gap: 7 (ref 5–15)
BUN: 12 mg/dL (ref 6–20)
CO2: 38 mmol/L — ABNORMAL HIGH (ref 22–32)
Calcium: 8.5 mg/dL — ABNORMAL LOW (ref 8.9–10.3)
Chloride: 89 mmol/L — ABNORMAL LOW (ref 98–111)
Creatinine, Ser: 0.49 mg/dL (ref 0.44–1.00)
GFR, Estimated: >60 mL/min (ref >60)
Glucose, Bld: 342 mg/dL — ABNORMAL HIGH (ref 70–99)
Potassium: 4.1 mmol/L (ref 3.5–5.1)
Sodium: 134 mmol/L — ABNORMAL LOW (ref 135–145)

## 2022-01-23 LAB — GLUCOSE, CAPILLARY
Glucose-Capillary: 357 mg/dL — ABNORMAL HIGH (ref 70–99)
Glucose-Capillary: 341 mg/dL — ABNORMAL HIGH (ref 70–99)
Glucose-Capillary: 399 mg/dL — ABNORMAL HIGH (ref 70–99)
Glucose-Capillary: 375 mg/dL — ABNORMAL HIGH (ref 70–99)

## 2022-01-23 LAB — STREP PNEUMONIAE URINARY ANTIGEN: Strep Pneumo Urinary Antigen: NEGATIVE

## 2022-01-23 LAB — MRSA NEXT GEN BY PCR, NASAL: MRSA by PCR Next Gen: NOT DETECTED

## 2022-01-23 LAB — PROCALCITONIN: Procalcitonin: 0.26 ng/mL

## 2022-01-23 LAB — HIV ANTIBODY (ROUTINE TESTING W REFLEX): HIV Screen 4th Generation wRfx: NONREACTIVE

## 2022-01-23 MED ORDER — IPRATROPIUM-ALBUTEROL 0.5-2.5 (3) MG/3ML IN SOLN
3.0000 mL | Freq: Four times a day (QID) | RESPIRATORY_TRACT | Status: DC
  Administered 2022-01-23 – 2022-01-24 (×4): 3 mL via RESPIRATORY_TRACT
  Filled 2022-01-23 (×4): qty 3

## 2022-01-23 MED ORDER — INSULIN ASPART 100 UNIT/ML IJ SOLN
10.0000 [IU] | Freq: Three times a day (TID) | INTRAMUSCULAR | Status: DC
  Administered 2022-01-23 (×2): 10 [IU] via SUBCUTANEOUS

## 2022-01-23 MED ORDER — INSULIN ASPART 100 UNIT/ML IJ SOLN
0.0000 [IU] | Freq: Three times a day (TID) | INTRAMUSCULAR | Status: DC
  Administered 2022-01-23: 20 [IU] via SUBCUTANEOUS
  Administered 2022-01-24 – 2022-01-25 (×2): 11 [IU] via SUBCUTANEOUS
  Administered 2022-01-25: 4 [IU] via SUBCUTANEOUS
  Administered 2022-01-26: 11 [IU] via SUBCUTANEOUS
  Administered 2022-01-26 (×2): 4 [IU] via SUBCUTANEOUS

## 2022-01-23 MED ORDER — GABAPENTIN 300 MG PO CAPS
300.0000 mg | ORAL_CAPSULE | Freq: Three times a day (TID) | ORAL | Status: DC
Start: 2022-01-23 — End: 2022-01-26
  Administered 2022-01-23 – 2022-01-26 (×11): 300 mg via ORAL
  Filled 2022-01-23 (×11): qty 1

## 2022-01-23 MED ORDER — PROCHLORPERAZINE EDISYLATE 10 MG/2ML IJ SOLN
10.0000 mg | Freq: Four times a day (QID) | INTRAMUSCULAR | Status: DC | PRN
  Administered 2022-01-23: 10 mg via INTRAVENOUS
  Filled 2022-01-23 (×3): qty 2

## 2022-01-23 MED ORDER — INSULIN ASPART 100 UNIT/ML IJ SOLN
14.0000 [IU] | Freq: Three times a day (TID) | INTRAMUSCULAR | Status: DC
Start: 2022-01-23 — End: 2022-01-24
  Administered 2022-01-23: 14 [IU] via SUBCUTANEOUS

## 2022-01-23 MED ORDER — METHYLPREDNISOLONE SODIUM SUCC 125 MG IJ SOLR
120.0000 mg | Freq: Every day | INTRAMUSCULAR | Status: DC
  Administered 2022-01-23: 120 mg via INTRAVENOUS
  Filled 2022-01-23: qty 2

## 2022-01-23 MED ORDER — CHLORHEXIDINE GLUCONATE CLOTH 2 % EX PADS
6.0000 | MEDICATED_PAD | Freq: Every day | CUTANEOUS | Status: DC
  Administered 2022-01-24 – 2022-01-25 (×2): 6 via TOPICAL

## 2022-01-23 MED ORDER — METHYLPREDNISOLONE SODIUM SUCC 40 MG IJ SOLR
40.0000 mg | Freq: Every day | INTRAMUSCULAR | Status: DC
  Administered 2022-01-24 – 2022-01-26 (×3): 40 mg via INTRAVENOUS
  Filled 2022-01-23 (×3): qty 1

## 2022-01-23 MED ORDER — GUAIFENESIN-DM 100-10 MG/5ML PO SYRP
5.0000 mL | ORAL_SOLUTION | ORAL | Status: DC | PRN
  Administered 2022-01-23 – 2022-01-24 (×5): 5 mL via ORAL
  Filled 2022-01-23 (×5): qty 5

## 2022-01-23 MED ORDER — INSULIN ASPART 100 UNIT/ML IJ SOLN
0.0000 [IU] | Freq: Every day | INTRAMUSCULAR | Status: DC
  Administered 2022-01-23: 5 [IU] via SUBCUTANEOUS

## 2022-01-23 MED ORDER — INSULIN GLARGINE-YFGN 100 UNIT/ML ~~LOC~~ SOLN
30.0000 [IU] | Freq: Every day | SUBCUTANEOUS | Status: DC
Start: 2022-01-23 — End: 2022-01-24
  Administered 2022-01-23: 30 [IU] via SUBCUTANEOUS
  Filled 2022-01-23 (×2): qty 0.3

## 2022-01-23 NOTE — Assessment & Plan Note (Signed)
-   Protonix ordered for GI protection 

## 2022-01-23 NOTE — Assessment & Plan Note (Addendum)
-  Presented with progressive dyspnea, cough and productive green phlegm, as well she is with significant wheezing upon presentation and increased work of breathing, she is with elevated PCO2 at 82, baseline oxygen 2 to 3 L, she was requiring 6 L upon presentation to ED ?-Initially required BiPAP, now on nasal cannula.  ?-Continue with IV Rocephin and azithromycin ?-Encouraged use incentive spirometry and flutter valve once off BiPAP ?-Continue with IV Solu-Medrol ?-Hopefully will be able to discharge home 3/6 ?-Continue with scheduled DuoNebs and as needed albuterol ?

## 2022-01-23 NOTE — Hospital Course (Addendum)
56 y.o. female, with a history of COPD, chronic respiratory failure on 3 L, hypertension, chronic pain syndrome, diabetes mellitus type 2, anxiety, tobacco abuse presenting with shortness of breath, and cough, patient reports worsening dyspnea over the last week, as well she reports cough, with productive yellow phlegm, as well as report worsening wheezing and started to develop some chest discomfort, related to cough,, as well she does report some nausea, vomiting, started couple days ago, reports as well poor appetite, as well she does report a granddaughter has been having some sickness recently and she thinks she might got it from here, she tried to increase her oxygen level without much help with her dyspnea which prompted her to come to ED ?-In ED patient was with increased work of breathing on presentation, requiring 6 L oxygen, her ABG showing PCO2 of 82, where she started to not BiPAP, her chest x-ray significant for multifocal opacity, cell count elevated at 20, sodium at 131, glucose elevated at 328, patient started on steroids, antibiotics and [facility] hospitalist consulted to admit. ? ?01/23/2022:  breathing remains difficult, coughing, wheezing, shortness of breath but able to come off bipap intermittently.  ? ?01/24/2022: overall breathing better but not back to baseline.  Productive cough persists.   ? ?01/25/2022: continues with higher oxygen requirement, wheezing, coughing, chest congestion. Started tussionex.  ? ?01/26/2022:  Pt reports breathing better, coughing less, back on baseline oxygen requirement. DC home today.  ?

## 2022-01-23 NOTE — TOC Progression Note (Signed)
?  Transition of Care (TOC) Screening Note ? ? ?Patient Details  ?Name: [name] ?Date of Birth: 10/17/1966 ? ? ?Transition of Care (TOC) CM/SW Contact:    ?Elliot Gault, LCSW ?Phone Number: ?01/23/2022, 11:22 AM ? ? ? ?Transition of Care Department [facility]) has reviewed patient and no TOC needs have been identified at this time. We will continue to monitor patient advancement through interdisciplinary progression rounds. If new patient transition needs arise, please place a TOC consult. ? ? ?

## 2022-01-23 NOTE — Assessment & Plan Note (Signed)
Continue IV antibiotics as ordered.  ?Follow for clinical improvement.  ?

## 2022-01-23 NOTE — Assessment & Plan Note (Addendum)
-  Continue with IV Solu-Medrol ?-Continue with scheduled DuoNebs and as needed albuterol ?-trying to get back to baseline oxygen requirement.  ?

## 2022-01-23 NOTE — Assessment & Plan Note (Addendum)
Pt strongly advised to quit but not willing to consider at this time.  ?Nicotine patch ordered as needed.  ?

## 2022-01-23 NOTE — Assessment & Plan Note (Addendum)
Pt strongly advised to stop all tobacco use. ?She needs to follow up with Dr. Sherene Sires outpatient.   ?

## 2022-01-23 NOTE — Progress Notes (Signed)
PROGRESS NOTE   [name]  KKX:[id number] DOB: 1966/08/10 DOA: 01/22/2022 PCP: Ponciano Ort, [facility]   Chief Complaint  Patient presents with   Shortness of Breath   Level of care: Stepdown  Brief Admission History:  56 y.o. female, with a history of COPD, chronic respiratory failure on 3 L, hypertension, chronic pain syndrome, diabetes mellitus type 2, anxiety, tobacco abuse presenting with shortness of breath, and cough, patient reports worsening dyspnea over the last week, as well she reports cough, with productive yellow phlegm, as well as report worsening wheezing and started to develop some chest discomfort, related to cough,, as well she does report some nausea, vomiting, started couple days ago, reports as well poor appetite, as well she does report a granddaughter has been having some sickness recently and she thinks she might got it from here, she tried to increase her oxygen level without much help with her dyspnea which prompted her to come to ED -In ED patient was with increased work of breathing on presentation, requiring 6 L oxygen, her ABG showing PCO2 of 82, where she started to not BiPAP, her chest x-ray significant for multifocal opacity, cell count elevated at 20, sodium at 131, glucose elevated at 328, patient started on steroids, antibiotics and [facility] consulted to admit.  01/23/2022:  breathing remains difficult, coughing, wheezing, shortness of breath but able to come off bipap intermittently.     Assessment and Plan: * Acute hypercapnic respiratory failure (HCC) -Presents with progressive dyspnea, cough and productive green phlegm, as well she is with significant wheezing upon presentation and increased work of breathing, she is with elevated PCO2 at 82, baseline oxygen 2 to 3 L, she was requiring 6 L upon presentation to ED -With BiPAP for now, will keep as needed and nightly -Continue with IV Rocephin and azithromycin -Encouraged use incentive  spirometry and flutter valve once off BiPAP -Follow sputum culture, Legionella and strep antigen -Continue with IV Solu-Medrol -Continue with scheduled DuoNebs and as needed albuterol  Acute-on-chronic respiratory failure (HCC) -Continue with IV Solu-Medrol -Continue with scheduled DuoNebs and as needed albuterol  COPD exacerbation (HCC) IV steroids, bronchodilators and other supportive measures as ordered.  Follow for clinical improvement.   Multifocal Pneumonia Continue IV antibiotics as ordered.  Follow for clinical improvement.   GERD (gastroesophageal reflux disease) Protonix ordered for GI protection  DM (diabetes mellitus) (HCC) Severe steroid induced hyperglycemia exacerbated by pharmacy changing solumedrol to 120 mg daily versus what I had ordered 60 mg Q12 hours to allow for [place] to better control the blood sugars.  Intensify treatment today with increased basal/bolus/supplemental coverage and frequent CBG monitoring.   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  Pt strongly advised to stop all tobacco use. She needs to follow up with Dr. Sherene Sires outpatient.    Cigarette smoker Pt strongly advised to quit but not willing to consider at this time.   DVT prophylaxis: enoxparin Code Status: full  Family Communication: patient  Disposition: Status is: Inpatient Remains inpatient appropriate because: IV steroids and antibiotics required  Consultants:   Procedures:   Antimicrobials:  Ceftriaxone, azithromycin 01/23/22   Subjective: Pt reports productive cough, chest congestion.  Objective: Vitals:   01/23/22 0900 01/23/22 1000 01/23/22 1200 01/23/22 1415  BP: 129/76 (!) 97/46    Pulse: (!) 101 (!) 107    Resp:      Temp:   (!) 97.4 F (36.3 C)   TempSrc:   Oral   SpO2: 100% 93%  93%  Weight:      Height:        Intake/Output Summary (Last 24 hours) at 01/23/2022 1449 Last data filed at 01/23/2022 1400 Gross per 24 hour  Intake 2070 ml  Output 400 ml  Net  1670 ml   Filed Weights   01/22/22 1550 01/22/22 2100 01/23/22 0400  Weight: 59 kg 58.3 kg 58.3 kg   Examination:  General exam: Appears calm and comfortable  Respiratory system: poor air movement, tight, diffuse expiratory wheezing. Moderate increased work of breathing.  Cardiovascular system: normal S1 & S2 heard. No JVD, murmurs, rubs, gallops or clicks. No pedal edema. Gastrointestinal system: Abdomen is nondistended, soft and nontender. No organomegaly or masses felt. Normal bowel sounds heard. Central nervous system: Alert and oriented. No focal neurological deficits. Extremities: Symmetric 5 x 5 power. Skin: No rashes, lesions or ulcers. Psychiatry: Judgement and insight appear normal. Mood & affect appropriate.   Data Reviewed: I have personally reviewed following labs and imaging studies  CBC: Recent Labs  Lab 01/22/22 1633 01/23/22 0436  WBC 20.3* 13.0*  NEUTROABS 17.4*  --   HGB 11.5* 10.5*  HCT 36.9 34.8*  MCV 85.8 88.1  PLT 389 370    Basic Metabolic Panel: Recent Labs  Lab 01/22/22 1633 01/23/22 0436  NA 131* 134*  K 3.7 4.1  CL 86* 89*  CO2 36* 38*  GLUCOSE 328* 342*  BUN 10 12  CREATININE 0.49 0.49  CALCIUM 8.4* 8.5*    CBG: Recent Labs  Lab 01/23/22 0911 01/23/22 1159  GLUCAP 357* 341*    Recent Results (from the past 240 hour(s))  Resp Panel by RT-PCR (Flu A&B, Covid) Nasopharyngeal Swab     Status: None   Collection Time: 01/22/22  4:26 PM   Specimen: Nasopharyngeal Swab; Nasopharyngeal(NP) swabs in vial transport medium  Result Value Ref Range Status   SARS Coronavirus 2 by RT PCR NEGATIVE NEGATIVE Final    Comment: (NOTE) SARS-CoV-2 target nucleic acids are NOT DETECTED.  The SARS-CoV-2 RNA is generally detectable in upper respiratory specimens during the acute phase of infection. The lowest concentration of SARS-CoV-2 viral copies this assay can detect is 138 copies/mL. A negative result does not preclude SARS-Cov-2 infection  and should not be used as the sole basis for treatment or other patient management decisions. A negative result may occur with  improper specimen collection/handling, submission of specimen other than nasopharyngeal swab, presence of viral mutation(s) within the areas targeted by this assay, and inadequate number of viral copies(<138 copies/mL). A negative result must be combined with clinical observations, patient history, and epidemiological information. The expected result is Negative.  Fact Sheet for Patients:  BloggerCourse.comhttps://www.fda.gov/media/152166/download  Fact Sheet for Healthcare Providers:  SeriousBroker.ithttps://www.fda.gov/media/152162/download  This test is no t yet approved or cleared by the [place] FDA and  has been authorized for detection and/or diagnosis of SARS-CoV-2 by FDA under an Emergency Use Authorization (EUA). This EUA will remain  in effect (meaning this test can be used) for the duration of the COVID-19 declaration under Section 564(b)(1) of the Act, 21 U.S.C.section 360bbb-3(b)(1), unless the authorization is terminated  or revoked sooner.       Influenza A by PCR NEGATIVE NEGATIVE Final   Influenza B by PCR NEGATIVE NEGATIVE Final    Comment: (NOTE) The Xpert Xpress SARS-CoV-2/FLU/RSV plus assay is intended as an aid in the diagnosis of influenza from Nasopharyngeal swab specimens and should not be used as a sole basis for treatment. Nasal  washings and aspirates are unacceptable for Xpert Xpress SARS-CoV-2/FLU/RSV testing.  Fact Sheet for Patients: BloggerCourse.com  Fact Sheet for Healthcare Providers: SeriousBroker.it  This test is not yet approved or cleared by the [place] FDA and has been authorized for detection and/or diagnosis of SARS-CoV-2 by FDA under an Emergency Use Authorization (EUA). This EUA will remain in effect (meaning this test can be used) for the duration of the COVID-19 declaration  under Section 564(b)(1) of the Act, 21 U.S.C. section 360bbb-3(b)(1), unless the authorization is terminated or revoked.  Performed at [facility], [address], [place], [place] [address]   Blood culture (routine x 2)     Status: None (Preliminary result)   Collection Time: 01/22/22  4:33 PM   Specimen: BLOOD LEFT ARM  Result Value Ref Range Status   Specimen Description BLOOD LEFT ARM  Final   Special Requests   Final    BOTTLES DRAWN AEROBIC AND ANAEROBIC Blood Culture results may not be optimal due to an excessive volume of blood received in culture bottles   Culture   Final    NO GROWTH < 24 HOURS Performed at [facility], [address] [place], [place] [address]    Report Status PENDING  Incomplete  Blood culture (routine x 2)     Status: None (Preliminary result)   Collection Time: 01/22/22  4:33 PM   Specimen: BLOOD RIGHT ARM  Result Value Ref Range Status   Specimen Description BLOOD RIGHT ARM  Final   Special Requests   Final    BOTTLES DRAWN AEROBIC AND ANAEROBIC Blood Culture results may not be optimal due to an excessive volume of blood received in culture bottles   Culture   Final    NO GROWTH < 24 HOURS Performed at [facility], [address], [place], [place] [address]    Report Status PENDING  Incomplete  MRSA Next Gen by PCR, Nasal     Status: None   Collection Time: 01/22/22  9:36 PM   Specimen: Nasal Mucosa; Nasal Swab  Result Value Ref Range Status   MRSA by PCR Next Gen NOT DETECTED NOT DETECTED Final    Comment: (NOTE) The GeneXpert MRSA Assay (FDA approved for NASAL specimens only), is one component of a comprehensive MRSA colonization surveillance program. It is not intended to diagnose MRSA infection nor to guide or monitor treatment for MRSA infections. Test performance is not FDA approved in patients less than 2 years old. Performed at [facility], [address], [place], [place] [address]   Expectorated Sputum Assessment w Gram Stain,  Rflx to Resp Cult     Status: None (Preliminary result)   Collection Time: 01/22/22 10:37 PM   Specimen: Expectorated Sputum  Result Value Ref Range Status   Specimen Description EXPECTORATED SPUTUM  Final   Special Requests NONE  Final   Sputum evaluation   Final    THIS SPECIMEN IS ACCEPTABLE FOR SPUTUM CULTURE Performed at [facility], [address], [place], [place] [address]    Report Status PENDING  Incomplete  Culture, Respiratory w Gram Stain     Status: None (Preliminary result)   Collection Time: 01/22/22 10:37 PM  Result Value Ref Range Status   Specimen Description   Final    EXPECTORATED SPUTUM Performed at [facility], [address], [place], [place] [address]    Special Requests   Final    NONE Reflexed from [id number] Performed at [facility], [address], [place], [place]  [address]    Gram Stain   Final    MODERATE WBC PRESENT, PREDOMINANTLY PMN NO ORGANISMS SEEN Performed at [facility], [address], [place], [place] [address]    Culture PENDING  Incomplete   Report Status PENDING  Incomplete     Radiology Studies: DG Chest Port 1 View  Result Date: 01/22/2022 CLINICAL DATA:  Productive cough.  Hypoxemia. EXAM: PORTABLE CHEST 1 VIEW COMPARISON:  08/16/2021 FINDINGS: Diffuse bilateral nodular airspace disease throughout both lungs similar to the prior study. No confluent consolidation. Small bilateral effusions unchanged. Negative for heart failure. IMPRESSION: No change in bilateral nodular airspace disease which may be due to infection. Small bilateral effusions. Electronically Signed   By: Marlan Palau M.D.   On: 01/22/2022 16:29    Scheduled Meds:  Chlorhexidine Gluconate Cloth  6 each Topical Daily   enoxaparin (LOVENOX) injection  40 mg Subcutaneous Q24H   fluticasone  2 spray Each Nare Daily   gabapentin  300 mg Oral TID   insulin aspart  10 Units Subcutaneous TID WC   insulin glargine-yfgn  30 Units Subcutaneous Daily    ipratropium-albuterol  3 mL Nebulization QID   irbesartan  150 mg Oral Daily   loratadine  10 mg Oral Daily   methylPREDNISolone (SOLU-MEDROL) injection  120 mg Intravenous Daily   pantoprazole  40 mg Oral Daily   rosuvastatin  20 mg Oral Daily   Continuous Infusions:  azithromycin 500 mg (01/22/22 2149)   cefTRIAXone (ROCEPHIN)  IV      LOS: 1 day   Time spent: 45 mins  Maveryk Renstrom Laural Benes, MD How to contact the [facility] Attending or Consulting provider 7A - 7P or covering provider during after hours 7P -7A, for this patient?  Check the care team in [facility] and look for a) attending/consulting [facility] provider listed and b) the [facility] team listed Log into [email or web address] and use [facility]'s universal password to access. If you do not have the password, please contact the hospital operator. Locate the [facility] provider you are looking for under [facility] and page to a number that you can be directly reached. If you still have difficulty reaching the provider, please page the [facility] (Director on Call) for the Hospitalists listed on amion for assistance.  01/23/2022, 2:49 PM

## 2022-01-23 NOTE — Assessment & Plan Note (Addendum)
Severe steroid induced hyperglycemia exacerbated by solumedrol.  ?Intensifed treatment with basal/bolus/supplemental coverage and frequent CBG monitoring.  ?CBG (last 3)  ?Recent Labs  ?  01/25/22 ?0713 01/25/22 ?1129 01/25/22 ?1628  ?GLUCAP 117* 195* 268*  ? ? ?

## 2022-01-23 NOTE — Assessment & Plan Note (Addendum)
IV steroids, bronchodilators and other supportive measures as ordered.  Follow for clinical improvement.  Better overall. Hopefully can discharge home tomorrow.  ?

## 2022-01-24 ENCOUNTER — Encounter (HOSPITAL_COMMUNITY): Payer: Self-pay | Admitting: Internal Medicine

## 2022-01-24 LAB — BASIC METABOLIC PANEL
Anion gap: 8 (ref 5–15)
BUN: 16 mg/dL (ref 6–20)
CO2: 39 mmol/L — ABNORMAL HIGH (ref 22–32)
Calcium: 8.8 mg/dL — ABNORMAL LOW (ref 8.9–10.3)
Chloride: 89 mmol/L — ABNORMAL LOW (ref 98–111)
Creatinine, Ser: 0.56 mg/dL (ref 0.44–1.00)
GFR, Estimated: >60 mL/min (ref >60)
Glucose, Bld: 417 mg/dL — ABNORMAL HIGH (ref 70–99)
Potassium: 3.6 mmol/L (ref 3.5–5.1)
Sodium: 136 mmol/L (ref 135–145)

## 2022-01-24 LAB — GLUCOSE, CAPILLARY
Glucose-Capillary: 282 mg/dL — ABNORMAL HIGH (ref 70–99)
Glucose-Capillary: 87 mg/dL (ref 70–99)
Glucose-Capillary: 240 mg/dL — ABNORMAL HIGH (ref 70–99)
Glucose-Capillary: 119 mg/dL — ABNORMAL HIGH (ref 70–99)
Glucose-Capillary: 190 mg/dL — ABNORMAL HIGH (ref 70–99)

## 2022-01-24 LAB — PROCALCITONIN: Procalcitonin: 0.18 ng/mL

## 2022-01-24 MED ORDER — INSULIN GLARGINE-YFGN 100 UNIT/ML ~~LOC~~ SOLN
40.0000 [IU] | Freq: Every day | SUBCUTANEOUS | Status: DC
  Filled 2022-01-24 (×2): qty 0.4

## 2022-01-24 MED ORDER — SENNOSIDES-DOCUSATE SODIUM 8.6-50 MG PO TABS
1.0000 | ORAL_TABLET | Freq: Every day | ORAL | Status: DC
  Administered 2022-01-24 – 2022-01-25 (×2): 1 via ORAL
  Filled 2022-01-24 (×2): qty 1

## 2022-01-24 MED ORDER — HYDROCOD POLI-CHLORPHE POLI ER 10-8 MG/5ML PO SUER
5.0000 mL | Freq: Two times a day (BID) | ORAL | Status: DC | PRN
  Administered 2022-01-24 – 2022-01-26 (×4): 5 mL via ORAL
  Filled 2022-01-24 (×4): qty 5

## 2022-01-24 MED ORDER — SENNOSIDES-DOCUSATE SODIUM 8.6-50 MG PO TABS
2.0000 | ORAL_TABLET | Freq: Every day | ORAL | Status: DC
Start: 2022-01-24 — End: 2022-01-24

## 2022-01-24 MED ORDER — POLYETHYLENE GLYCOL 3350 17 G PO PACK
17.0000 g | PACK | Freq: Every day | ORAL | Status: DC
  Administered 2022-01-24: 17 g via ORAL
  Filled 2022-01-24 (×3): qty 1

## 2022-01-24 MED ORDER — INSULIN ASPART 100 UNIT/ML IJ SOLN
12.0000 [IU] | Freq: Three times a day (TID) | INTRAMUSCULAR | Status: DC
  Administered 2022-01-24 (×2): 12 [IU] via SUBCUTANEOUS

## 2022-01-24 MED ORDER — ORAL CARE MOUTH RINSE
15.0000 mL | Freq: Two times a day (BID) | OROMUCOSAL | Status: DC
  Administered 2022-01-25 – 2022-01-26 (×3): 15 mL via OROMUCOSAL

## 2022-01-24 MED ORDER — INSULIN ASPART 100 UNIT/ML IJ SOLN
20.0000 [IU] | Freq: Three times a day (TID) | INTRAMUSCULAR | Status: DC
  Administered 2022-01-24: 20 [IU] via SUBCUTANEOUS

## 2022-01-24 MED ORDER — INSULIN GLARGINE-YFGN 100 UNIT/ML ~~LOC~~ SOLN
50.0000 [IU] | Freq: Every day | SUBCUTANEOUS | Status: DC
  Administered 2022-01-24: 50 [IU] via SUBCUTANEOUS
  Filled 2022-01-24 (×2): qty 0.5

## 2022-01-24 NOTE — Progress Notes (Signed)
PROGRESS NOTE   [name]  ERD:[id number] DOB: [birth year] DOA: 01/22/2022 PCP: Ponciano Ort, [facility]   Chief Complaint  Patient presents with   Shortness of Breath   Level of care: Telemetry  Brief Admission History:  56 y.o. female, with a history of COPD, chronic respiratory failure on 3 L, hypertension, chronic pain syndrome, diabetes mellitus type 2, anxiety, tobacco abuse presenting with shortness of breath, and cough, patient reports worsening dyspnea over the last week, as well she reports cough, with productive yellow phlegm, as well as report worsening wheezing and started to develop some chest discomfort, related to cough,, as well she does report some nausea, vomiting, started couple days ago, reports as well poor appetite, as well she does report a granddaughter has been having some sickness recently and she thinks she might got it from here, she tried to increase her oxygen level without much help with her dyspnea which prompted her to come to ED -In ED patient was with increased work of breathing on presentation, requiring 6 L oxygen, her ABG showing PCO2 of 82, where she started to not BiPAP, her chest x-ray significant for multifocal opacity, cell count elevated at 20, sodium at 131, glucose elevated at 328, patient started on steroids, antibiotics and [facility] hospital[facility] consulted to admit.  01/23/2022:  breathing remains difficult, coughing, wheezing, shortness of breath but able to come off bipap intermittently.   01/24/2022: overall breathing better but not back to baseline.  Productive cough persists.    Assessment and Plan: * Acute hypercapnic respiratory failure (HCC) -Presented with progressive dyspnea, cough and productive green phlegm, as well she is with significant wheezing upon presentation and increased work of breathing, she is with elevated PCO2 at 82, baseline oxygen 2 to 3 L, she was requiring 6 L upon presentation to ED -Initially required BiPAP, now on nasal  cannula.  -Continue with IV Rocephin and azithromycin -Encouraged use incentive spirometry and flutter valve once off BiPAP -Continue with IV Solu-Medrol -Continue with scheduled DuoNebs and as needed albuterol  Acute-on-chronic respiratory failure (HCC) -Continue with IV Solu-Medrol -Continue with scheduled DuoNebs and as needed albuterol  Multifocal Pneumonia Continue IV antibiotics as ordered.  Follow for clinical improvement.   COPD exacerbation (HCC) IV steroids, bronchodilators and other supportive measures as ordered.  Follow for clinical improvement.   DM (diabetes mellitus) (HCC) Severe steroid induced hyperglycemia exacerbated by solumedrol.  Intensifed treatment with basal/bolus/supplemental coverage and frequent CBG monitoring.  CBG (last 3)  Recent Labs    01/23/22 2024 01/24/22 0745 01/24/22 1135  GLUCAP 375* 282* 87     GERD (gastroesophageal reflux disease) Protonix ordered for GI protection  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  Pt strongly advised to stop all tobacco use. She needs to follow up with Dr. Sherene Sires outpatient.    Cigarette smoker Pt strongly advised to quit but not willing to consider at this time.   DVT prophylaxis: enoxparin Code Status: full  Family Communication: patient  Disposition: Status is: Inpatient Remains inpatient appropriate because: IV steroids and antibiotics required  Consultants:   Procedures:   Antimicrobials:  Ceftriaxone, azithromycin 01/23/22>>   Subjective: Pt reports ongoing cough, chest congestion, productive cough, sputum more clear.  Objective: Vitals:   01/24/22 0800 01/24/22 0900 01/24/22 1000 01/24/22 1100  BP:      Pulse: (!) 107 (!) 111 (!) 111 (!) 116  Resp: (!) 34 17 (!) 28 (!) 23  Temp:      TempSrc:  SpO2: 92% 93% 94% 94%  Weight:      Height:        Intake/Output Summary (Last 24 hours) at 01/24/2022 1147 Last data filed at 01/24/2022 0700 Gross per 24 hour  Intake 860 ml   Output 2475 ml  Net -1615 ml   Filed Weights   01/22/22 1550 01/22/22 2100 01/23/22 0400  Weight: 59 kg 58.3 kg 58.3 kg   Examination:  General exam: Appears calm and comfortable  Respiratory system: diffuse expiratory wheezing. Moderate increased work of breathing.  Cardiovascular system: normal S1 & S2 heard. Tachycardic rate, No JVD, murmurs, rubs, gallops or clicks. No pedal edema. Gastrointestinal system: Abdomen is nondistended, soft and nontender. No organomegaly or masses felt. Normal bowel sounds heard. Central nervous system: Alert and oriented. No focal neurological deficits. Extremities: Symmetric 5 x 5 power. Skin: No rashes, lesions or ulcers. Psychiatry: Judgement and insight appear poor. Mood & affect appropriate.   Data Reviewed: I have personally reviewed following labs and imaging studies  CBC: Recent Labs  Lab 01/22/22 1633 01/23/22 0436  WBC 20.3* 13.0*  NEUTROABS 17.4*  --   HGB 11.5* 10.5*  HCT 36.9 34.8*  MCV 85.8 88.1  PLT 389 0000000    Basic Metabolic Panel: Recent Labs  Lab 01/22/22 1633 01/23/22 0436 01/24/22 0420  NA 131* 134* 136  K 3.7 4.1 3.6  CL 86* 89* 89*  CO2 36* 38* 39*  GLUCOSE 328* 342* 417*  BUN 10 12 16   CREATININE 0.49 0.49 0.56  CALCIUM 8.4* 8.5* 8.8*    CBG: Recent Labs  Lab 01/23/22 1159 01/23/22 1643 01/23/22 2024 01/24/22 0745 01/24/22 1135  GLUCAP 341* 399* 375* 282* 87    Recent Results (from the past 240 hour(s))  Resp Panel by RT-PCR (Flu A&B, Covid) Nasopharyngeal Swab     Status: None   Collection Time: 01/22/22  4:26 PM   Specimen: Nasopharyngeal Swab; Nasopharyngeal(NP) swabs in vial transport medium  Result Value Ref Range Status   SARS Coronavirus 2 by RT PCR NEGATIVE NEGATIVE Final    Comment: (NOTE) SARS-CoV-2 target nucleic acids are NOT DETECTED.  The SARS-CoV-2 RNA is generally detectable in upper respiratory specimens during the acute phase of infection. The lowest concentration of  SARS-CoV-2 viral copies this assay can detect is 138 copies/mL. A negative result does not preclude SARS-Cov-2 infection and should not be used as the sole basis for treatment or other patient management decisions. A negative result may occur with  improper specimen collection/handling, submission of specimen other than nasopharyngeal swab, presence of viral mutation(s) within the areas targeted by this assay, and inadequate number of viral copies(<138 copies/mL). A negative result must be combined with clinical observations, patient history, and epidemiological information. The expected result is Negative.  Fact Sheet for Patients:  EntrepreneurPulse.com.au  Fact Sheet for Healthcare Providers:  IncredibleEmployment.be  This test is no t yet approved or cleared by the [place] FDA and  has been authorized for detection and/or diagnosis of SARS-CoV-2 by FDA under an Emergency Use Authorization (EUA). This EUA will remain  in effect (meaning this test can be used) for the duration of the COVID-19 declaration under Section 564(b)(1) of the Act, 21 U.S.C.section 360bbb-3(b)(1), unless the authorization is terminated  or revoked sooner.       Influenza A by PCR NEGATIVE NEGATIVE Final   Influenza B by PCR NEGATIVE NEGATIVE Final    Comment: (NOTE) The Xpert Xpress SARS-CoV-2/FLU/RSV plus assay is intended as an aid  in the diagnosis of influenza from Nasopharyngeal swab specimens and should not be used as a sole basis for treatment. Nasal washings and aspirates are unacceptable for Xpert Xpress SARS-CoV-2/FLU/RSV testing.  Fact Sheet for Patients: EntrepreneurPulse.com.au  Fact Sheet for Healthcare Providers: IncredibleEmployment.be  This test is not yet approved or cleared by the [place] FDA and has been authorized for detection and/or diagnosis of SARS-CoV-2 by FDA under an Emergency Use  Authorization (EUA). This EUA will remain in effect (meaning this test can be used) for the duration of the COVID-19 declaration under Section 564(b)(1) of the Act, 21 U.S.C. section 360bbb-3(b)(1), unless the authorization is terminated or revoked.  Performed at [facility], [address] [place], [place] [address]   Blood culture (routine x 2)     Status: None (Preliminary result)   Collection Time: 01/22/22  4:33 PM   Specimen: BLOOD LEFT ARM  Result Value Ref Range Status   Specimen Description BLOOD LEFT ARM  Final   Special Requests   Final    BOTTLES DRAWN AEROBIC AND ANAEROBIC Blood Culture results may not be optimal due to an excessive volume of blood received in culture bottles   Culture   Final    NO GROWTH 2 DAYS Performed at [facility], [address] [place], [place] [address]    Report Status PENDING  Incomplete  Blood culture (routine x 2)     Status: None (Preliminary result)   Collection Time: 01/22/22  4:33 PM   Specimen: BLOOD RIGHT ARM  Result Value Ref Range Status   Specimen Description BLOOD RIGHT ARM  Final   Special Requests   Final    BOTTLES DRAWN AEROBIC AND ANAEROBIC Blood Culture results may not be optimal due to an excessive volume of blood received in culture bottles   Culture   Final    NO GROWTH 2 DAYS Performed at [facility], [address], [place], [place] [address]    Report Status PENDING  Incomplete  MRSA Next Gen by PCR, Nasal     Status: None   Collection Time: 01/22/22  9:36 PM   Specimen: Nasal Mucosa; Nasal Swab  Result Value Ref Range Status   MRSA by PCR Next Gen NOT DETECTED NOT DETECTED Final    Comment: (NOTE) The GeneXpert MRSA Assay (FDA approved for NASAL specimens only), is one component of a comprehensive MRSA colonization surveillance program. It is not intended to diagnose MRSA infection nor to guide or monitor treatment for MRSA infections. Test performance is not FDA approved in patients less than 61  years old. Performed at [facility], [address] [place], [place] [address]   Expectorated Sputum Assessment w Gram Stain, Rflx to Resp Cult     Status: None (Preliminary result)   Collection Time: 01/22/22 10:37 PM   Specimen: Expectorated Sputum  Result Value Ref Range Status   Specimen Description EXPECTORATED SPUTUM  Final   Special Requests NONE  Final   Sputum evaluation   Final    THIS SPECIMEN IS ACCEPTABLE FOR SPUTUM CULTURE Performed at [facility], [address] [place], [place] [address]    Report Status PENDING  Incomplete  Culture, Respiratory w Gram Stain     Status: None (Preliminary result)   Collection Time: 01/22/22 10:37 PM  Result Value Ref Range Status   Specimen Description   Final    EXPECTORATED SPUTUM Performed at [facility], [address], [place], [place] [address]    Special Requests  Final    NONE Reflexed from [id number] Performed at [facility], [address], [place], [place] [address]    Gram Stain   Final    MODERATE WBC PRESENT, PREDOMINANTLY PMN NO ORGANISMS SEEN    Culture   Final    CULTURE REINCUBATED FOR BETTER GROWTH Performed at [facility] [address] [place], [place] [address]    Report Status PENDING  Incomplete     Radiology Studies: DG Chest Port 1 View  Result Date: 01/22/2022 CLINICAL DATA:  Productive cough.  Hypoxemia. EXAM: PORTABLE CHEST 1 VIEW COMPARISON:  08/16/2021 FINDINGS: Diffuse bilateral nodular airspace disease throughout both lungs similar to the prior study. No confluent consolidation. Small bilateral effusions unchanged. Negative for heart failure. IMPRESSION: No change in bilateral nodular airspace disease which may be due to infection. Small bilateral effusions. Electronically Signed   By: Franchot Gallo M.D.   On: 01/22/2022 16:29    Scheduled Meds:  Chlorhexidine Gluconate Cloth  6 each Topical Daily   enoxaparin (LOVENOX) injection  40 mg Subcutaneous Q24H   fluticasone  2 spray Each  Nare Daily   gabapentin  300 mg Oral TID   insulin aspart  0-20 Units Subcutaneous TID WC   insulin aspart  0-5 Units Subcutaneous QHS   insulin aspart  12 Units Subcutaneous TID WC   [START ON 01/25/2022] insulin glargine-yfgn  40 Units Subcutaneous Daily   ipratropium-albuterol  3 mL Nebulization Q6H   irbesartan  150 mg Oral Daily   loratadine  10 mg Oral Daily   methylPREDNISolone (SOLU-MEDROL) injection  40 mg Intravenous Daily   pantoprazole  40 mg Oral Daily   rosuvastatin  20 mg Oral Daily   Continuous Infusions:  azithromycin Stopped (01/23/22 2323)   cefTRIAXone (ROCEPHIN)  IV 2 g (01/23/22 1657)    LOS: 2 days   Time spent: 36 mins  Barron Vanloan Wynetta Emery, MD How to contact the [facility] Attending or Consulting provider [place] or covering provider during after hours [place], for this patient?  Check the care team in [facility] and look for a) attending/consulting [facility] provider listed and b) the [facility] team listed Log into [email or web address] and use [place]'s universal password to access. If you do not have the password, please contact the hospital operator. Locate the [facility] provider you are looking for under [facility] and page to a number that you can be directly reached. If you still have difficulty reaching the provider, please page the [facility] (Director on Call) for the Hospitalists listed on amion for assistance.  01/24/2022, 11:47 AM

## 2022-01-24 NOTE — Progress Notes (Signed)
Called report to 300 nurse who will be taking patient in room 301. Gathered all belongings including phone and clothing for transport to floor. Meds from patient drawer was also taken to the floor.  ?

## 2022-01-25 DIAGNOSIS — J449 Chronic obstructive pulmonary disease, unspecified: Secondary | ICD-10-CM

## 2022-01-25 DIAGNOSIS — K219 Gastro-esophageal reflux disease without esophagitis: Secondary | ICD-10-CM

## 2022-01-25 LAB — GLUCOSE, CAPILLARY
Glucose-Capillary: 66 mg/dL — ABNORMAL LOW (ref 70–99)
Glucose-Capillary: 157 mg/dL — ABNORMAL HIGH (ref 70–99)
Glucose-Capillary: 117 mg/dL — ABNORMAL HIGH (ref 70–99)
Glucose-Capillary: 195 mg/dL — ABNORMAL HIGH (ref 70–99)
Glucose-Capillary: 268 mg/dL — ABNORMAL HIGH (ref 70–99)
Glucose-Capillary: 383 mg/dL — ABNORMAL HIGH (ref 70–99)

## 2022-01-25 MED ORDER — INSULIN GLARGINE-YFGN 100 UNIT/ML ~~LOC~~ SOLN
25.0000 [IU] | Freq: Every day | SUBCUTANEOUS | Status: DC
  Administered 2022-01-25 – 2022-01-26 (×2): 25 [IU] via SUBCUTANEOUS
  Filled 2022-01-25 (×3): qty 0.25

## 2022-01-25 MED ORDER — INSULIN ASPART 100 UNIT/ML IJ SOLN
8.0000 [IU] | Freq: Three times a day (TID) | INTRAMUSCULAR | Status: DC
  Administered 2022-01-25 – 2022-01-26 (×4): 8 [IU] via SUBCUTANEOUS

## 2022-01-25 MED ORDER — NICOTINE 21 MG/24HR TD PT24: 21.0000 mg | MEDICATED_PATCH | Freq: Every day | TRANSDERMAL | Status: DC | PRN

## 2022-01-25 MED ORDER — IPRATROPIUM-ALBUTEROL 0.5-2.5 (3) MG/3ML IN SOLN
3.0000 mL | Freq: Three times a day (TID) | RESPIRATORY_TRACT | Status: DC
  Administered 2022-01-25 – 2022-01-26 (×5): 3 mL via RESPIRATORY_TRACT
  Filled 2022-01-25 (×5): qty 3

## 2022-01-25 NOTE — Progress Notes (Signed)
PROGRESS NOTE   [name]  [id number] DOB: [birth year] DOA: 01/22/2022 PCP: Alanson Puls, [facility]   Chief Complaint  Patient presents with   Shortness of Breath   Level of care: Telemetry  Brief Admission History:  57 y.o. female, with a history of COPD, chronic respiratory failure on 3 L, hypertension, chronic pain syndrome, diabetes mellitus type 2, anxiety, tobacco abuse presenting with shortness of breath, and cough, patient reports worsening dyspnea over the last week, as well she reports cough, with productive yellow phlegm, as well as report worsening wheezing and started to develop some chest discomfort, related to cough,, as well she does report some nausea, vomiting, started couple days ago, reports as well poor appetite, as well she does report a granddaughter has been having some sickness recently and she thinks she might got it from here, she tried to increase her oxygen level without much help with her dyspnea which prompted her to come to ED -In ED patient was with increased work of breathing on presentation, requiring 6 L oxygen, her ABG showing PCO2 of 82, where she started to not BiPAP, her chest x-ray significant for multifocal opacity, cell count elevated at 20, sodium at 131, glucose elevated at 328, patient started on steroids, antibiotics and [facility] hospitalist consulted to admit.  01/23/2022:  breathing remains difficult, coughing, wheezing, shortness of breath but able to come off bipap intermittently.   01/24/2022: overall breathing better but not back to baseline.  Productive cough persists.    01/25/2022: continues with higher oxygen requirement, wheezing, coughing, chest congestion. Started tussionex.   Assessment and Plan: * Acute hypercapnic respiratory failure ([place]) -Presented with progressive dyspnea, cough and productive green phlegm, as well she is with significant wheezing upon presentation and increased work of breathing, she is with elevated PCO2 at 82,  baseline oxygen 2 to 3 L, she was requiring 6 L upon presentation to ED -Initially required BiPAP, now on nasal cannula.  -Continue with IV Rocephin and azithromycin -Encouraged use incentive spirometry and flutter valve once off BiPAP -Continue with IV Solu-Medrol -Hopefully will be able to discharge home 3/6 -Continue with scheduled DuoNebs and as needed albuterol  Acute-on-chronic respiratory failure ([place]) -Continue with IV Solu-Medrol -Continue with scheduled DuoNebs and as needed albuterol -trying to get back to baseline oxygen requirement.   Multifocal Pneumonia Continue IV antibiotics as ordered.  Follow for clinical improvement.   COPD exacerbation (HCC) IV steroids, bronchodilators and other supportive measures as ordered.  Follow for clinical improvement.  Better overall. Hopefully can discharge home tomorrow.   DM (diabetes mellitus) ([place]) Severe steroid induced hyperglycemia exacerbated by solumedrol.  Intensifed treatment with basal/bolus/supplemental coverage and frequent CBG monitoring.  CBG (last 3)  Recent Labs    01/25/22 0713 01/25/22 1129 01/25/22 1628  GLUCAP 117* 195* 268*     GERD (gastroesophageal reflux disease) Protonix ordered for GI protection  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  Pt strongly advised to stop all tobacco use. She needs to follow up with Dr. Melvyn Novas outpatient.    Cigarette smoker Pt strongly advised to quit but not willing to consider at this time.  Nicotine patch ordered as needed.   DVT prophylaxis: enoxparin Code Status: full  Family Communication: patient  Disposition: Status is: Inpatient Remains inpatient appropriate because: IV steroids and antibiotics required  Consultants:   Procedures:   Antimicrobials:  Ceftriaxone, azithromycin 01/23/22>>   Subjective: Pt reports difficulty speaking due to shortness of breath, mild chest discomfort from coughing.  Does not feel ready for home.    Objective: Vitals:   01/25/22 0240 01/25/22 0718 01/25/22 1251 01/25/22 1324  BP: 116/75  108/65   Pulse: (!) 110  (!) 103   Resp: 19  20   Temp: 98.4 F (36.9 C)  98.1 F (36.7 C)   TempSrc: Oral  Oral   SpO2: 100% 97% 95% 96%  Weight:      Height:        Intake/Output Summary (Last 24 hours) at 01/25/2022 1722 Last data filed at 01/25/2022 1251 Gross per 24 hour  Intake 1620 ml  Output --  Net 1620 ml   Filed Weights   01/22/22 2100 01/23/22 0400 01/24/22 1500  Weight: 58.3 kg 58.3 kg 59.9 kg   Examination:  General exam: Appears calm and comfortable  Respiratory system: less severe expiratory wheezing. No noticeable increased work of breathing.  Cardiovascular system: normal S1 & S2 heard. Tachycardic rate, No JVD, murmurs, rubs, gallops or clicks. No pedal edema. Gastrointestinal system: Abdomen is nondistended, soft and nontender. No organomegaly or masses felt. Normal bowel sounds heard. Central nervous system: Alert and oriented. No focal neurological deficits. Extremities: Symmetric 5 x 5 power. Skin: No rashes, lesions or ulcers. Psychiatry: Judgement and insight appear poor. Mood & affect appropriate.   Data Reviewed: I have personally reviewed following labs and imaging studies  CBC: Recent Labs  Lab 01/22/22 1633 01/23/22 0436  WBC 20.3* 13.0*  NEUTROABS 17.4*  --   HGB 11.5* 10.5*  HCT 36.9 34.8*  MCV 85.8 88.1  PLT 389 0000000    Basic Metabolic Panel: Recent Labs  Lab 01/22/22 1633 01/23/22 0436 01/24/22 0420  NA 131* 134* 136  K 3.7 4.1 3.6  CL 86* 89* 89*  CO2 36* 38* 39*  GLUCOSE 328* 342* 417*  BUN 10 12 16   CREATININE 0.49 0.49 0.56  CALCIUM 8.4* 8.5* 8.8*    CBG: Recent Labs  Lab 01/25/22 0233 01/25/22 0446 01/25/22 0713 01/25/22 1129 01/25/22 1628  GLUCAP 66* 157* 117* 195* 268*    Recent Results (from the past 240 hour(s))  Resp Panel by RT-PCR (Flu A&B, Covid) Nasopharyngeal Swab     Status: None   Collection Time:  01/22/22  4:26 PM   Specimen: Nasopharyngeal Swab; Nasopharyngeal(NP) swabs in vial transport medium  Result Value Ref Range Status   SARS Coronavirus 2 by RT PCR NEGATIVE NEGATIVE Final    Comment: (NOTE) SARS-CoV-2 target nucleic acids are NOT DETECTED.  The SARS-CoV-2 RNA is generally detectable in upper respiratory specimens during the acute phase of infection. The lowest concentration of SARS-CoV-2 viral copies this assay can detect is 138 copies/mL. A negative result does not preclude SARS-Cov-2 infection and should not be used as the sole basis for treatment or other patient management decisions. A negative result may occur with  improper specimen collection/handling, submission of specimen other than nasopharyngeal swab, presence of viral mutation(s) within the areas targeted by this assay, and inadequate number of viral copies(<138 copies/mL). A negative result must be combined with clinical observations, patient history, and epidemiological information. The expected result is Negative.  Fact Sheet for Patients:  EntrepreneurPulse.com.au  Fact Sheet for Healthcare Providers:  IncredibleEmployment.be  This test is no t yet approved or cleared by the [place] FDA and  has been authorized for detection and/or diagnosis of SARS-CoV-2 by FDA under an Emergency Use Authorization (EUA). This EUA will remain  in effect (meaning this test can be used) for the duration  of the COVID-19 declaration under Section 564(b)(1) of the Act, 21 U.S.C.section 360bbb-3(b)(1), unless the authorization is terminated  or revoked sooner.       Influenza A by PCR NEGATIVE NEGATIVE Final   Influenza B by PCR NEGATIVE NEGATIVE Final    Comment: (NOTE) The Xpert Xpress SARS-CoV-2/FLU/RSV plus assay is intended as an aid in the diagnosis of influenza from Nasopharyngeal swab specimens and should not be used as a sole basis for treatment. Nasal washings  and aspirates are unacceptable for Xpert Xpress SARS-CoV-2/FLU/RSV testing.  Fact Sheet for Patients: EntrepreneurPulse.com.au  Fact Sheet for Healthcare Providers: IncredibleEmployment.be  This test is not yet approved or cleared by the [place] FDA and has been authorized for detection and/or diagnosis of SARS-CoV-2 by FDA under an Emergency Use Authorization (EUA). This EUA will remain in effect (meaning this test can be used) for the duration of the COVID-19 declaration under Section 564(b)(1) of the Act, 21 U.S.C. section 360bbb-3(b)(1), unless the authorization is terminated or revoked.  Performed at [facility], [address], [place], [place] [address]   Blood culture (routine x 2)     Status: None (Preliminary result)   Collection Time: 01/22/22  4:33 PM   Specimen: BLOOD LEFT ARM  Result Value Ref Range Status   Specimen Description BLOOD LEFT ARM  Final   Special Requests   Final    BOTTLES DRAWN AEROBIC AND ANAEROBIC Blood Culture results may not be optimal due to an excessive volume of blood received in culture bottles   Culture   Final    NO GROWTH 2 DAYS Performed at [facility], [address] [place], [place] [address]    Report Status PENDING  Incomplete  Blood culture (routine x 2)     Status: None (Preliminary result)   Collection Time: 01/22/22  4:33 PM   Specimen: BLOOD RIGHT ARM  Result Value Ref Range Status   Specimen Description BLOOD RIGHT ARM  Final   Special Requests   Final    BOTTLES DRAWN AEROBIC AND ANAEROBIC Blood Culture results may not be optimal due to an excessive volume of blood received in culture bottles   Culture   Final    NO GROWTH 2 DAYS Performed at [facility], [address], [place], [place] [address]    Report Status PENDING  Incomplete  MRSA Next Gen by PCR, Nasal     Status: None   Collection Time: 01/22/22  9:36 PM   Specimen: Nasal Mucosa; Nasal Swab  Result Value Ref Range  Status   MRSA by PCR Next Gen NOT DETECTED NOT DETECTED Final    Comment: (NOTE) The GeneXpert MRSA Assay (FDA approved for NASAL specimens only), is one component of a comprehensive MRSA colonization surveillance program. It is not intended to diagnose MRSA infection nor to guide or monitor treatment for MRSA infections. Test performance is not FDA approved in patients less than 102 years old. Performed at [facility], [address] [place], [place] [address]   Expectorated Sputum Assessment w Gram Stain, Rflx to Resp Cult     Status: None (Preliminary result)   Collection Time: 01/22/22 10:37 PM   Specimen: Expectorated Sputum  Result Value Ref Range Status   Specimen Description EXPECTORATED SPUTUM  Final   Special Requests NONE  Final   Sputum evaluation   Final    THIS SPECIMEN IS ACCEPTABLE FOR SPUTUM CULTURE Performed at [facility], [address], [place], [place] [address]    Report  Status PENDING  Incomplete  Culture, Respiratory w Gram Stain     Status: None (Preliminary result)   Collection Time: 01/22/22 10:37 PM  Result Value Ref Range Status   Specimen Description   Final    EXPECTORATED SPUTUM Performed at [facility], [address] [place], [place] [address]    Special Requests   Final    NONE Reflexed from [id number] Performed at [facility], [address], [place], [place] [address]    Gram Stain   Final    MODERATE WBC PRESENT, PREDOMINANTLY PMN NO ORGANISMS SEEN    Culture   Final    RARE Normal respiratory flora-no Staph aureus or Pseudomonas seen Performed at [facility] [address] [place], [place] [address]    Report Status PENDING  Incomplete     Radiology Studies: No results found.  Scheduled Meds:  Chlorhexidine Gluconate Cloth  6 each Topical Daily   enoxaparin (LOVENOX) injection  40 mg Subcutaneous Q24H   fluticasone  2 spray Each Nare Daily   gabapentin  300 mg Oral TID   insulin aspart  0-20 Units Subcutaneous TID WC    insulin aspart  8 Units Subcutaneous TID WC   insulin glargine-yfgn  25 Units Subcutaneous Daily   ipratropium-albuterol  3 mL Nebulization TID   irbesartan  150 mg Oral Daily   loratadine  10 mg Oral Daily   mouth rinse  15 mL Mouth Rinse BID   methylPREDNISolone (SOLU-MEDROL) injection  40 mg Intravenous Daily   pantoprazole  40 mg Oral Daily   polyethylene glycol  17 g Oral Daily   rosuvastatin  20 mg Oral Daily   senna-docusate  1 tablet Oral QHS   Continuous Infusions:  azithromycin Stopped (01/24/22 2144)   cefTRIAXone (ROCEPHIN)  IV 2 g (01/25/22 1710)    LOS: 3 days   Time spent: 35 mins  Joyce Tyer Wynetta Emery, MD How to contact the [facility] Attending or Consulting provider [place] or covering provider during after hours [place], for this patient?  Check the care team in [facility] and look for a) attending/consulting [facility] provider listed and b) the [facility] team listed Log into [email or web address] and use [place]'s universal password to access. If you do not have the password, please contact the hospital operator. Locate the [facility] provider you are looking for under [facility] and page to a number that you can be directly reached. If you still have difficulty reaching the provider, please page the [facility] (Director on Call) for the Hospitalists listed on amion for assistance.  01/25/2022, 5:22 PM

## 2022-01-26 LAB — CULTURE, RESPIRATORY W GRAM STAIN: Culture: NORMAL

## 2022-01-26 LAB — GLUCOSE, CAPILLARY
Glucose-Capillary: 237 mg/dL — ABNORMAL HIGH (ref 70–99)
Glucose-Capillary: 193 mg/dL — ABNORMAL HIGH (ref 70–99)
Glucose-Capillary: 152 mg/dL — ABNORMAL HIGH (ref 70–99)
Glucose-Capillary: 255 mg/dL — ABNORMAL HIGH (ref 70–99)

## 2022-01-26 MED ORDER — PREDNISONE 20 MG PO TABS: ORAL_TABLET | ORAL | 0 refills | Status: DC

## 2022-01-26 MED ORDER — POLYETHYLENE GLYCOL 3350 17 G PO PACK: 17.0000 g | PACK | Freq: Every day | ORAL | 1 refills | Status: DC

## 2022-01-26 MED ORDER — AZITHROMYCIN 500 MG PO TABS: 500.0000 mg | ORAL_TABLET | Freq: Every day | ORAL | 0 refills | Status: AC

## 2022-01-26 MED ORDER — PREDNISONE 20 MG PO TABS
ORAL_TABLET | ORAL | 0 refills | Status: DC
Start: 2022-01-27 — End: 2022-03-13

## 2022-01-26 MED ORDER — ALBUTEROL SULFATE (2.5 MG/3ML) 0.083% IN NEBU: 2.5000 mg | INHALATION_SOLUTION | Freq: Four times a day (QID) | RESPIRATORY_TRACT | 3 refills | Status: DC | PRN

## 2022-01-26 MED ORDER — AZITHROMYCIN 500 MG PO TABS: 500.0000 mg | ORAL_TABLET | Freq: Every day | ORAL | 0 refills | Status: DC

## 2022-01-26 MED ORDER — HYDROCOD POLI-CHLORPHE POLI ER 10-8 MG/5ML PO SUER: 5.0000 mL | Freq: Two times a day (BID) | ORAL | 0 refills | Status: DC | PRN

## 2022-01-26 NOTE — Discharge Instructions (Signed)
IMPORTANT INFORMATION: PAY CLOSE ATTENTION   PHYSICIAN DISCHARGE INSTRUCTIONS  Follow with Primary care provider  [facility], [facility]  and other consultants as instructed by your Hospitalist Physician  SEEK MEDICAL CARE OR RETURN TO EMERGENCY ROOM IF SYMPTOMS COME BACK, WORSEN OR NEW PROBLEM DEVELOPS   Please note: You were cared for by a hospitalist during your hospital stay. Every effort will be made to forward records to your primary care provider.  You can request that your primary care provider send for your hospital records if they have not received them.  Once you are discharged, your primary care physician will handle any further medical issues. Please note that NO REFILLS for any discharge medications will be authorized once you are discharged, as it is imperative that you return to your primary care physician (or establish a relationship with a primary care physician if you do not have one) for your post hospital discharge needs so that they can reassess your need for medications and monitor your lab values.  Please get a complete blood count and chemistry panel checked by your Primary MD at your next visit, and again as instructed by your Primary MD.  Get Medicines reviewed and adjusted: Please take all your medications with you for your next visit with your Primary MD  Laboratory/radiological data: Please request your Primary MD to go over all hospital tests and procedure/radiological results at the follow up, please ask your primary care provider to get all Hospital records sent to his/her office.  In some cases, they will be blood work, cultures and biopsy results pending at the time of your discharge. Please request that your primary care provider follow up on these results.  If you are diabetic, please bring your blood sugar readings with you to your follow up appointment with primary care.    Please call and make your follow up appointments as soon as possible.    Also  Note the following: If you experience worsening of your admission symptoms, develop shortness of breath, life threatening emergency, suicidal or homicidal thoughts you must seek medical attention immediately by calling 911 or calling your MD immediately  if symptoms less severe.  You must read complete instructions/literature along with all the possible adverse reactions/side effects for all the Medicines you take and that have been prescribed to you. Take any new Medicines after you have completely understood and accpet all the possible adverse reactions/side effects.   Do not drive when taking Pain medications or sleeping medications (Benzodiazepines)  Do not take more than prescribed Pain, Sleep and Anxiety Medications. It is not advisable to combine anxiety,sleep and pain medications without talking with your primary care practitioner  Special Instructions: If you have smoked or chewed Tobacco  in the last 2 yrs please stop smoking, stop any regular Alcohol  and or any Recreational drug use.  Wear Seat belts while driving.  Do not drive if taking any narcotic, mind altering or controlled substances or recreational drugs or alcohol.       

## 2022-01-26 NOTE — Progress Notes (Signed)
Inpatient Diabetes Program Recommendations ? ?AACE/ADA: New Consensus Statement on Inpatient Glycemic Control  ? ?Target Ranges:  Prepandial:   less than 140 mg/dL ?     Peak postprandial:   less than 180 mg/dL (1-2 hours) ?     Critically ill patients:  140 - 180 mg/dL  ? ? Latest Reference Range & Units 01/26/22 03:02 01/26/22 07:32  ?Glucose-Capillary 70 - 99 mg/dL 024 (H) 097 (H)  ? ? Latest Reference Range & Units 01/25/22 07:13 01/25/22 11:29 01/25/22 16:28 01/25/22 21:40  ?Glucose-Capillary 70 - 99 mg/dL 353 (H) 299 (H) 242 (H) 383 (H)  ? ?Review of Glycemic Control ? ?Diabetes history: DM2 ?Outpatient Diabetes medications: Lantus 15 units daily, Metformin 1000 mg BID ?Current orders for Inpatient glycemic control: Semglee 25 units daily, Novolog 0-20  units TID with meals, Novolog 8 units TID with meals; Solumedrol 40 mg daily ? ?Inpatient Diabetes Program Recommendations:   ? ?Insulin: Please consider adding Novolog 0-5 units QHS for bedtime correction.  If steroids are continued as ordered, please consider increasing meal coverage to Novolog 10 units TID with meals if patient eats at least 50% of meals. ? ?Thanks, ?Orlando Penner, RN, MSN, CDE ?Diabetes Coordinator ?Inpatient Diabetes Program ?[phone] (Team Pager from 8am to 5pm) ? ? ?

## 2022-01-26 NOTE — Progress Notes (Signed)
PATIENT DISCHARGED TO HOME WITH HER DAUGHTER  pATIENT VERBALIZED UNDERSTANDING OF ALL INSTRUCTIONS ?

## 2022-01-26 NOTE — Discharge Summary (Signed)
Physician Discharge Summary  [name] BJY:[id number] DOB: 09/02/1966 DOA: 01/22/2022  [facility] Pulmonary: Dr. Sherene SiresWert   Admit date: 01/22/2022 Discharge date: 01/26/2022  Admitted From:  Home  Disposition: Home   Recommendations for Outpatient Follow-up:  Follow up with PCP in 1 weeks Follow up with Dr. Sherene SiresWert pulmonologist in 2 weeks  Discharge Condition: STABLE   CODE STATUS: FULL  DIET: resume prior home diet    Brief Hospitalization Summary: Please see all hospital notes, images, labs for full details of the hospitalization. 56 y.o. female, with a history of COPD, chronic respiratory failure on 3 L, hypertension, chronic pain syndrome, diabetes mellitus type 2, anxiety, tobacco abuse presenting with shortness of breath, and cough, patient reports worsening dyspnea over the last week, as well she reports cough, with productive yellow phlegm, as well as report worsening wheezing and started to develop some chest discomfort, related to cough,, as well she does report some nausea, vomiting, started couple days ago, reports as well poor appetite, as well she does report a granddaughter has been having some sickness recently and she thinks she might got it from here, she tried to increase her oxygen level without much help with her dyspnea which prompted her to come to ED -In ED patient was with increased work of breathing on presentation, requiring 6 L oxygen, her ABG showing PCO2 of 82, where she started to not BiPAP, her chest x-ray significant for multifocal opacity, cell count elevated at 20, sodium at 131, glucose elevated at 328, patient started on steroids, antibiotics and [facility] hospitalist consulted to admit.  01/23/2022:  breathing remains difficult, coughing, wheezing, shortness of breath but able to come off bipap intermittently.   01/24/2022: overall breathing better but not back to baseline.  Productive cough persists.    01/25/2022: continues with higher oxygen  requirement, wheezing, coughing, chest congestion. Started tussionex.   01/26/2022:  Pt reports breathing better, coughing less, back on baseline oxygen requirement. DC home today.   Hospital Course by problem list  Assessment and Plan: * Acute hypercapnic respiratory failure (HCC) -Presented with progressive dyspnea, cough and productive green phlegm, as well she is with significant wheezing upon presentation and increased work of breathing, she is with elevated PCO2 at 82, baseline oxygen 2 to 3 L, she was requiring 6 L upon presentation to ED -Initially required BiPAP, now on nasal cannula.  -Continue with IV Rocephin and azithromycin -Encouraged use incentive spirometry and flutter valve once off BiPAP -Continue with IV Solu-Medrol -Hopefully will be able to discharge home 3/6 -Continue with scheduled DuoNebs and as needed albuterol  Acute-on-chronic respiratory failure (HCC) -Continue with IV Solu-Medrol -Continue with scheduled DuoNebs and as needed albuterol -trying to get back to baseline oxygen requirement.   Multifocal Pneumonia Continue IV antibiotics as ordered.  Follow for clinical improvement.   COPD exacerbation (HCC) IV steroids, bronchodilators and other supportive measures as ordered.  Follow for clinical improvement.  Better overall. Hopefully can discharge home tomorrow.   DM (diabetes mellitus) (HCC) Severe steroid induced hyperglycemia exacerbated by solumedrol.  Intensifed treatment with basal/bolus/supplemental coverage and frequent CBG monitoring.  CBG (last 3)  Recent Labs    01/25/22 0713 01/25/22 1129 01/25/22 1628  GLUCAP 117* 195* 268*     GERD (gastroesophageal reflux disease) Protonix ordered for GI protection  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  Pt strongly advised to stop all tobacco use. She needs to follow up with Dr. Sherene SiresWert outpatient.  Cigarette smoker Pt strongly advised to quit but not willing to consider at this time.   Nicotine patch ordered as needed.    Discharge Diagnoses:  Principal Problem:   Acute hypercapnic respiratory failure (HCC) Active Problems:   DM (diabetes mellitus) (HCC)   COPD exacerbation (HCC)   Multifocal Pneumonia   Acute-on-chronic respiratory failure (HCC)   GERD (gastroesophageal reflux disease)   Cigarette smoker   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    Discharge Instructions:  Allergies as of 01/26/2022       Reactions   Levofloxacin Anaphylaxis   Levofloxacin In D5w Itching, Swelling, Rash   IV only IV only   Penicillins Other (See Comments)   convulsions   Doxycycline Nausea And Vomiting   Morphine Nausea Only        Medication List     STOP taking these medications    celecoxib 100 MG capsule Commonly known as: CELEBREX       TAKE these medications    acetaminophen 500 MG tablet Commonly known as: TYLENOL Take 500 mg by mouth every 6 (six) hours as needed for fever.   albuterol 108 (90 Base) MCG/ACT inhaler Commonly known as: VENTOLIN HFA Inhale 2 puffs into the lungs every 4 (four) hours as needed for wheezing or shortness of breath.   albuterol (2.5 MG/3ML) 0.083% nebulizer solution Commonly known as: PROVENTIL Take 3 mLs (2.5 mg total) by nebulization every 6 (six) hours as needed for wheezing or shortness of breath.   azithromycin 500 MG tablet Commonly known as: ZITHROMAX Take 1 tablet (500 mg total) by mouth daily for 3 days. Take 2 tabs PO x 1 dose, then 1 tab PO QD x 4 days Start taking on: January 27, 2022   chlorpheniramine-HYDROcodone 10-8 MG/5ML Take 5 mLs by mouth every 12 (twelve) hours as needed for cough.   fluticasone 50 MCG/ACT nasal spray Commonly known as: FLONASE Place 2 sprays into both nostrils daily.   gabapentin 300 MG capsule Commonly known as: NEURONTIN Take 300 mg by mouth 3 (three) times daily.   glucose blood test strip 1 each by Other route 3 (three) times daily. Use as instructed,  accucheck test strips   insulin glargine 100 UNIT/ML Solostar Pen Commonly known as: LANTUS Inject 15 Units into the skin daily.   Insulin Pen Needle 31G X 5 MM Misc Use as directed   ipratropium-albuterol 0.5-2.5 (3) MG/3ML Soln Commonly known as: DUONEB Take 3 mLs by nebulization See admin instructions. Use 1 vial  every 6 to 8 hours as needed for shortness of breath   irbesartan 150 MG tablet Commonly known as: AVAPRO TAKE 1 TABLET BY MOUTH EVERY DAY   loratadine 10 MG tablet Commonly known as: CLARITIN Take 10 mg by mouth daily.   metFORMIN 500 MG tablet Commonly known as: GLUCOPHAGE Take 2 tablets (1,000 mg total) by mouth 2 (two) times daily.   methocarbamol 500 MG tablet Commonly known as: ROBAXIN Take 500 mg by mouth every 8 (eight) hours as needed.   ondansetron 4 MG disintegrating tablet Commonly known as: ZOFRAN-ODT Take by mouth.   oxyCODONE 5 MG immediate release tablet Commonly known as: Oxy IR/ROXICODONE Take 5 mg by mouth 4 (four) times daily.   pantoprazole 40 MG tablet Commonly known as: PROTONIX Take 1 tablet (40 mg total) by mouth daily.   polyethylene glycol 17 g packet Commonly known as: MIRALAX / GLYCOLAX Take 17 g by mouth daily.   predniSONE 20 MG tablet  Commonly known as: DELTASONE Take 2 PO QAM x5 days then 1 po QAM x 3 days Start taking on: January 27, 2022   rosuvastatin 20 MG tablet Commonly known as: CRESTOR Take 20 mg by mouth daily.   SUMAtriptan 50 MG tablet Commonly known as: IMITREX Take 50 mg by mouth as needed.        Follow-up Information     [facility], [facility]. Schedule an appointment as soon as possible for a visit in 1 week(s).   Why: Hospital Follow Up Contact information: [address] [place] [address] [phone]         Nyoka Cowden, MD. Schedule an appointment as soon as possible for a visit in 2 week(s).   Specialty: Pulmonary Disease Why: Hospital Follow Up Contact  information: [address] [place] [address] [phone]                Allergies  Allergen Reactions   Levofloxacin Anaphylaxis   Levofloxacin In D5w Itching, Swelling and Rash    IV only IV only   Penicillins Other (See Comments)    convulsions   Doxycycline Nausea And Vomiting   Morphine Nausea Only   Allergies as of 01/26/2022       Reactions   Levofloxacin Anaphylaxis   Levofloxacin In D5w Itching, Swelling, Rash   IV only IV only   Penicillins Other (See Comments)   convulsions   Doxycycline Nausea And Vomiting   Morphine Nausea Only        Medication List     STOP taking these medications    celecoxib 100 MG capsule Commonly known as: CELEBREX       TAKE these medications    acetaminophen 500 MG tablet Commonly known as: TYLENOL Take 500 mg by mouth every 6 (six) hours as needed for fever.   albuterol 108 (90 Base) MCG/ACT inhaler Commonly known as: VENTOLIN HFA Inhale 2 puffs into the lungs every 4 (four) hours as needed for wheezing or shortness of breath.   albuterol (2.5 MG/3ML) 0.083% nebulizer solution Commonly known as: PROVENTIL Take 3 mLs (2.5 mg total) by nebulization every 6 (six) hours as needed for wheezing or shortness of breath.   azithromycin 500 MG tablet Commonly known as: ZITHROMAX Take 1 tablet (500 mg total) by mouth daily for 3 days. Take 2 tabs PO x 1 dose, then 1 tab PO QD x 4 days Start taking on: January 27, 2022   chlorpheniramine-HYDROcodone 10-8 MG/5ML Take 5 mLs by mouth every 12 (twelve) hours as needed for cough.   fluticasone 50 MCG/ACT nasal spray Commonly known as: FLONASE Place 2 sprays into both nostrils daily.   gabapentin 300 MG capsule Commonly known as: NEURONTIN Take 300 mg by mouth 3 (three) times daily.   glucose blood test strip 1 each by Other route 3 (three) times daily. Use as instructed, accucheck test strips   insulin glargine 100 UNIT/ML Solostar Pen Commonly known  as: LANTUS Inject 15 Units into the skin daily.   Insulin Pen Needle 31G X 5 MM Misc Use as directed   ipratropium-albuterol 0.5-2.5 (3) MG/3ML Soln Commonly known as: DUONEB Take 3 mLs by nebulization See admin instructions. Use 1 vial  every 6 to 8 hours as needed for shortness of breath   irbesartan 150 MG tablet Commonly known as: AVAPRO TAKE 1 TABLET BY MOUTH EVERY DAY   loratadine 10 MG tablet Commonly known as: CLARITIN Take 10 mg  by mouth daily.   metFORMIN 500 MG tablet Commonly known as: GLUCOPHAGE Take 2 tablets (1,000 mg total) by mouth 2 (two) times daily.   methocarbamol 500 MG tablet Commonly known as: ROBAXIN Take 500 mg by mouth every 8 (eight) hours as needed.   ondansetron 4 MG disintegrating tablet Commonly known as: ZOFRAN-ODT Take by mouth.   oxyCODONE 5 MG immediate release tablet Commonly known as: Oxy IR/ROXICODONE Take 5 mg by mouth 4 (four) times daily.   pantoprazole 40 MG tablet Commonly known as: PROTONIX Take 1 tablet (40 mg total) by mouth daily.   polyethylene glycol 17 g packet Commonly known as: MIRALAX / GLYCOLAX Take 17 g by mouth daily.   predniSONE 20 MG tablet Commonly known as: DELTASONE Take 2 PO QAM x5 days then 1 po QAM x 3 days Start taking on: January 27, 2022   rosuvastatin 20 MG tablet Commonly known as: CRESTOR Take 20 mg by mouth daily.   SUMAtriptan 50 MG tablet Commonly known as: IMITREX Take 50 mg by mouth as needed.        Procedures/Studies: DG Chest Port 1 View  Result Date: 01/22/2022 CLINICAL DATA:  Productive cough.  Hypoxemia. EXAM: PORTABLE CHEST 1 VIEW COMPARISON:  08/16/2021 FINDINGS: Diffuse bilateral nodular airspace disease throughout both lungs similar to the prior study. No confluent consolidation. Small bilateral effusions unchanged. Negative for heart failure. IMPRESSION: No change in bilateral nodular airspace disease which may be due to infection. Small bilateral effusions. Electronically  Signed   By: Marlan Palau M.D.   On: 01/22/2022 16:29     Subjective: Pt reports breathing much better today, feeling much better.   Discharge Exam: Vitals:   01/25/22 2110 01/26/22 0344  BP: 118/68 134/84  Pulse: (!) 102 99  Resp: 17 17  Temp: 98.2 F (36.8 C) 98.3 F (36.8 C)  SpO2: 94% 95%   Vitals:   01/25/22 1324 01/25/22 2055 01/25/22 2110 01/26/22 0344  BP:  108/65 118/68 134/84  Pulse:  96 (!) 102 99  Resp:  20 17 17   Temp:   98.2 F (36.8 C) 98.3 F (36.8 C)  TempSrc:   Oral Oral  SpO2: 96% 97% 94% 95%  Weight:      Height:       General exam: Appears calm and comfortable  Respiratory system: good air movement bilateral. No noticeable increased work of breathing.  Cardiovascular system: normal S1 & S2 heard. Tachycardic rate, No JVD, murmurs, rubs, gallops or clicks. No pedal edema. Gastrointestinal system: Abdomen is nondistended, soft and nontender. No organomegaly or masses felt. Normal bowel sounds heard. Central nervous system: Alert and oriented. No focal neurological deficits. Extremities: Symmetric 5 x 5 power. Skin: No rashes, lesions or ulcers. Psychiatry: Judgement and insight appear poor. Mood & affect appropriate.   The results of significant diagnostics from this hospitalization (including imaging, microbiology, ancillary and laboratory) are listed below for reference.     Microbiology: Recent Results (from the past 240 hour(s))  Resp Panel by RT-PCR (Flu A&B, Covid) Nasopharyngeal Swab     Status: None   Collection Time: 01/22/22  4:26 PM   Specimen: Nasopharyngeal Swab; Nasopharyngeal(NP) swabs in vial transport medium  Result Value Ref Range Status   SARS Coronavirus 2 by RT PCR NEGATIVE NEGATIVE Final    Comment: (NOTE) SARS-CoV-2 target nucleic acids are NOT DETECTED.  The SARS-CoV-2 RNA is generally detectable in upper respiratory specimens during the acute phase of infection. The lowest concentration of SARS-CoV-2  viral copies this  assay can detect is 138 copies/mL. A negative result does not preclude SARS-Cov-2 infection and should not be used as the sole basis for treatment or other patient management decisions. A negative result may occur with  improper specimen collection/handling, submission of specimen other than nasopharyngeal swab, presence of viral mutation(s) within the areas targeted by this assay, and inadequate number of viral copies(<138 copies/mL). A negative result must be combined with clinical observations, patient history, and epidemiological information. The expected result is Negative.  Fact Sheet for Patients:  BloggerCourse.comhttps://www.fda.gov/media/152166/download  Fact Sheet for Healthcare Providers:  SeriousBroker.ithttps://www.fda.gov/media/152162/download  This test is no t yet approved or cleared by the [place] FDA and  has been authorized for detection and/or diagnosis of SARS-CoV-2 by FDA under an Emergency Use Authorization (EUA). This EUA will remain  in effect (meaning this test can be used) for the duration of the COVID-19 declaration under Section 564(b)(1) of the Act, 21 U.S.C.section 360bbb-3(b)(1), unless the authorization is terminated  or revoked sooner.       Influenza A by PCR NEGATIVE NEGATIVE Final   Influenza B by PCR NEGATIVE NEGATIVE Final    Comment: (NOTE) The Xpert Xpress SARS-CoV-2/FLU/RSV plus assay is intended as an aid in the diagnosis of influenza from Nasopharyngeal swab specimens and should not be used as a sole basis for treatment. Nasal washings and aspirates are unacceptable for Xpert Xpress SARS-CoV-2/FLU/RSV testing.  Fact Sheet for Patients: BloggerCourse.comhttps://www.fda.gov/media/152166/download  Fact Sheet for Healthcare Providers: SeriousBroker.ithttps://www.fda.gov/media/152162/download  This test is not yet approved or cleared by the [place] FDA and has been authorized for detection and/or diagnosis of SARS-CoV-2 by FDA under an Emergency Use Authorization (EUA). This EUA will  remain in effect (meaning this test can be used) for the duration of the COVID-19 declaration under Section 564(b)(1) of the Act, 21 U.S.C. section 360bbb-3(b)(1), unless the authorization is terminated or revoked.  Performed at [facility], [address] [place], [place] [address]   Blood culture (routine x 2)     Status: None (Preliminary result)   Collection Time: 01/22/22  4:33 PM   Specimen: BLOOD LEFT ARM  Result Value Ref Range Status   Specimen Description BLOOD LEFT ARM  Final   Special Requests   Final    BOTTLES DRAWN AEROBIC AND ANAEROBIC Blood Culture results may not be optimal due to an excessive volume of blood received in culture bottles   Culture   Final    NO GROWTH 2 DAYS Performed at [facility], [address] [place], [place] [address]    Report Status PENDING  Incomplete  Blood culture (routine x 2)     Status: None (Preliminary result)   Collection Time: 01/22/22  4:33 PM   Specimen: BLOOD RIGHT ARM  Result Value Ref Range Status   Specimen Description BLOOD RIGHT ARM  Final   Special Requests   Final    BOTTLES DRAWN AEROBIC AND ANAEROBIC Blood Culture results may not be optimal due to an excessive volume of blood received in culture bottles   Culture   Final    NO GROWTH 2 DAYS Performed at [facility], [address] [place], [place] [address]    Report Status PENDING  Incomplete  MRSA Next Gen by PCR, Nasal     Status: None   Collection Time: 01/22/22  9:36 PM   Specimen: Nasal Mucosa; Nasal Swab  Result Value Ref Range Status   MRSA by PCR Next Gen NOT DETECTED NOT DETECTED  Final    Comment: (NOTE) The GeneXpert MRSA Assay (FDA approved for NASAL specimens only), is one component of a comprehensive MRSA colonization surveillance program. It is not intended to diagnose MRSA infection nor to guide or monitor treatment for MRSA infections. Test performance is not FDA approved in patients less than 20 years old. Performed at [facility],  [address], [place], [place] [address]   Expectorated Sputum Assessment w Gram Stain, Rflx to Resp Cult     Status: None (Preliminary result)   Collection Time: 01/22/22 10:37 PM   Specimen: Expectorated Sputum  Result Value Ref Range Status   Specimen Description EXPECTORATED SPUTUM  Final   Special Requests NONE  Final   Sputum evaluation   Final    THIS SPECIMEN IS ACCEPTABLE FOR SPUTUM CULTURE Performed at [facility], [address], [place], [place] [address]    Report Status PENDING  Incomplete  Culture, Respiratory w Gram Stain     Status: None (Preliminary result)   Collection Time: 01/22/22 10:37 PM  Result Value Ref Range Status   Specimen Description   Final    EXPECTORATED SPUTUM Performed at [facility], [address] [place], [place] [address]    Special Requests   Final    NONE Reflexed from [id number] Performed at [facility], [address] [place], [place] [address]    Gram Stain   Final    MODERATE WBC PRESENT, PREDOMINANTLY PMN NO ORGANISMS SEEN    Culture   Final    RARE Normal respiratory flora-no Staph aureus or Pseudomonas seen Performed at [facility], [address], [place], [place] [address]    Report Status PENDING  Incomplete     Labs: BNP (last 3 results) No results for input(s): BNP in the last 8760 hours. Basic Metabolic Panel: Recent Labs  Lab 01/22/22 1633 01/23/22 0436 01/24/22 0420  NA 131* 134* 136  K 3.7 4.1 3.6  CL 86* 89* 89*  CO2 36* 38* 39*  GLUCOSE 328* 342* 417*  BUN 10 12 16   CREATININE 0.49 0.49 0.56  CALCIUM 8.4* 8.5* 8.8*   Liver Function Tests: Recent Labs  Lab 01/22/22 1633  AST 30  ALT 25  ALKPHOS 113  BILITOT 0.4  PROT 7.7  ALBUMIN 3.2*   No results for input(s): LIPASE, AMYLASE in the last 168 hours. No results for input(s): AMMONIA in the last 168 hours. CBC: Recent Labs  Lab 01/22/22 1633 01/23/22 0436  WBC 20.3* 13.0*  NEUTROABS 17.4*  --   HGB 11.5* 10.5*  HCT 36.9 34.8*  MCV 85.8  88.1  PLT 389 370   Cardiac Enzymes: No results for input(s): CKTOTAL, CKMB, CKMBINDEX, TROPONINI in the last 168 hours. BNP: Invalid input(s): POCBNP CBG: Recent Labs  Lab 01/25/22 1129 01/25/22 1628 01/25/22 2140 01/26/22 0302 01/26/22 0732  GLUCAP 195* 268* 383* 237* 193*   D-Dimer No results for input(s): DDIMER in the last 72 hours. Hgb A1c Recent Labs    01/23/22 1512  HGBA1C 7.6*   Lipid Profile No results for input(s): CHOL, HDL, LDLCALC, TRIG, CHOLHDL, LDLDIRECT in the last 72 hours. Thyroid function studies No results for input(s): TSH, T4TOTAL, T3FREE, THYROIDAB in the last 72 hours.  Invalid input(s): FREET3 Anemia work up No results for input(s): VITAMINB12, FOLATE, FERRITIN, TIBC, IRON, RETICCTPCT in the last 72 hours. Urinalysis    Component Value Date/Time   COLORURINE YELLOW 01/23/2022 1405   APPEARANCEUR CLEAR 01/23/2022 1405   LABSPEC 1.022  01/23/2022 1405   PHURINE 6.0 01/23/2022 1405   GLUCOSEU >=500 (A) 01/23/2022 1405   HGBUR NEGATIVE 01/23/2022 1405   BILIRUBINUR NEGATIVE 01/23/2022 1405   KETONESUR 5 (A) 01/23/2022 1405   PROTEINUR NEGATIVE 01/23/2022 1405   UROBILINOGEN 1.0 07/24/2014 1605   NITRITE NEGATIVE 01/23/2022 1405   LEUKOCYTESUR NEGATIVE 01/23/2022 1405   Sepsis Labs Invalid input(s): PROCALCITONIN,  WBC,  LACTICIDVEN Microbiology Recent Results (from the past 240 hour(s))  Resp Panel by RT-PCR (Flu A&B, Covid) Nasopharyngeal Swab     Status: None   Collection Time: 01/22/22  4:26 PM   Specimen: Nasopharyngeal Swab; Nasopharyngeal(NP) swabs in vial transport medium  Result Value Ref Range Status   SARS Coronavirus 2 by RT PCR NEGATIVE NEGATIVE Final    Comment: (NOTE) SARS-CoV-2 target nucleic acids are NOT DETECTED.  The SARS-CoV-2 RNA is generally detectable in upper respiratory specimens during the acute phase of infection. The lowest concentration of SARS-CoV-2 viral copies this assay can detect is 138 copies/mL.  A negative result does not preclude SARS-Cov-2 infection and should not be used as the sole basis for treatment or other patient management decisions. A negative result may occur with  improper specimen collection/handling, submission of specimen other than nasopharyngeal swab, presence of viral mutation(s) within the areas targeted by this assay, and inadequate number of viral copies(<138 copies/mL). A negative result must be combined with clinical observations, patient history, and epidemiological information. The expected result is Negative.  Fact Sheet for Patients:  BloggerCourse.com  Fact Sheet for Healthcare Providers:  SeriousBroker.it  This test is no t yet approved or cleared by the [place] FDA and  has been authorized for detection and/or diagnosis of SARS-CoV-2 by FDA under an Emergency Use Authorization (EUA). This EUA will remain  in effect (meaning this test can be used) for the duration of the COVID-19 declaration under Section 564(b)(1) of the Act, 21 U.S.C.section 360bbb-3(b)(1), unless the authorization is terminated  or revoked sooner.       Influenza A by PCR NEGATIVE NEGATIVE Final   Influenza B by PCR NEGATIVE NEGATIVE Final    Comment: (NOTE) The Xpert Xpress SARS-CoV-2/FLU/RSV plus assay is intended as an aid in the diagnosis of influenza from Nasopharyngeal swab specimens and should not be used as a sole basis for treatment. Nasal washings and aspirates are unacceptable for Xpert Xpress SARS-CoV-2/FLU/RSV testing.  Fact Sheet for Patients: BloggerCourse.com  Fact Sheet for Healthcare Providers: SeriousBroker.it  This test is not yet approved or cleared by the [place] FDA and has been authorized for detection and/or diagnosis of SARS-CoV-2 by FDA under an Emergency Use Authorization (EUA). This EUA will remain in effect (meaning this test  can be used) for the duration of the COVID-19 declaration under Section 564(b)(1) of the Act, 21 U.S.C. section 360bbb-3(b)(1), unless the authorization is terminated or revoked.  Performed at [facility], [address] [place], [place] [address]   Blood culture (routine x 2)     Status: None (Preliminary result)   Collection Time: 01/22/22  4:33 PM   Specimen: BLOOD LEFT ARM  Result Value Ref Range Status   Specimen Description BLOOD LEFT ARM  Final   Special Requests   Final    BOTTLES DRAWN AEROBIC AND ANAEROBIC Blood Culture results may not be optimal due to an excessive volume of blood received in culture bottles   Culture   Final    NO GROWTH 2 DAYS Performed at [facility], [address], [place],  [place] [address]    Report Status PENDING  Incomplete  Blood culture (routine x 2)     Status: None (Preliminary result)   Collection Time: 01/22/22  4:33 PM   Specimen: BLOOD RIGHT ARM  Result Value Ref Range Status   Specimen Description BLOOD RIGHT ARM  Final   Special Requests   Final    BOTTLES DRAWN AEROBIC AND ANAEROBIC Blood Culture results may not be optimal due to an excessive volume of blood received in culture bottles   Culture   Final    NO GROWTH 2 DAYS Performed at [facility], [address], [place], [place] [address]    Report Status PENDING  Incomplete  MRSA Next Gen by PCR, Nasal     Status: None   Collection Time: 01/22/22  9:36 PM   Specimen: Nasal Mucosa; Nasal Swab  Result Value Ref Range Status   MRSA by PCR Next Gen NOT DETECTED NOT DETECTED Final    Comment: (NOTE) The GeneXpert MRSA Assay (FDA approved for NASAL specimens only), is one component of a comprehensive MRSA colonization surveillance program. It is not intended to diagnose MRSA infection nor to guide or monitor treatment for MRSA infections. Test performance is not FDA approved in patients less than 41 years old. Performed at [facility], [address] [place], [place] [address]    Expectorated Sputum Assessment w Gram Stain, Rflx to Resp Cult     Status: None (Preliminary result)   Collection Time: 01/22/22 10:37 PM   Specimen: Expectorated Sputum  Result Value Ref Range Status   Specimen Description EXPECTORATED SPUTUM  Final   Special Requests NONE  Final   Sputum evaluation   Final    THIS SPECIMEN IS ACCEPTABLE FOR SPUTUM CULTURE Performed at [facility], [address], [place], [place] [address]    Report Status PENDING  Incomplete  Culture, Respiratory w Gram Stain     Status: None (Preliminary result)   Collection Time: 01/22/22 10:37 PM  Result Value Ref Range Status   Specimen Description   Final    EXPECTORATED SPUTUM Performed at [facility], [address] [place], [place] [address]    Special Requests   Final    NONE Reflexed from [id number] Performed at [facility], [address] [place], [place] [address]    Gram Stain   Final    MODERATE WBC PRESENT, PREDOMINANTLY PMN NO ORGANISMS SEEN    Culture   Final    RARE Normal respiratory flora-no Staph aureus or Pseudomonas seen Performed at [facility], [address], [place], [place] [address]    Report Status PENDING  Incomplete   Time coordinating discharge: 35 mins   SIGNED:  Standley Dakins, MD  [facility] 01/26/2022, 9:42 AM How to contact the [facility] Attending or Consulting provider 7A - 7P or covering provider during after hours 7P -7A, for this patient?  Check the care team in [facility] and look for a) attending/consulting [facility] provider listed and b) the [facility] team listed Log into [email or web address] and use [facility]'s universal password to access. If you do not have the password, please contact the hospital operator. Locate the [facility] provider you are looking for under [facility] and page to a number that you can be directly reached. If you still have difficulty reaching the provider, please page the [facility] (Director on Call) for the Hospitalists listed on amion for  assistance.

## 2022-01-27 LAB — LEGIONELLA PNEUMOPHILA SEROGP 1 UR AG: L. pneumophila Serogp 1 Ur Ag: NEGATIVE

## 2022-01-27 LAB — CULTURE, BLOOD (ROUTINE X 2)
Culture: NO GROWTH
Culture: NO GROWTH

## 2022-03-10 ENCOUNTER — Encounter (HOSPITAL_COMMUNITY): Payer: Self-pay

## 2022-03-10 ENCOUNTER — Inpatient Hospital Stay (HOSPITAL_COMMUNITY)
Admission: EM | Admit: 2022-03-10 | Discharge: 2022-03-13 | DRG: 190 | Disposition: A | Discharge status: Home / Self Care | Payer: Medicaid Other | Attending: Family Medicine | Admitting: Family Medicine

## 2022-03-10 ENCOUNTER — Other Ambulatory Visit: Payer: Self-pay

## 2022-03-10 ENCOUNTER — Emergency Department (HOSPITAL_COMMUNITY): Payer: Medicaid Other

## 2022-03-10 DIAGNOSIS — F411 Generalized anxiety disorder: Secondary | ICD-10-CM | POA: Diagnosis present

## 2022-03-10 DIAGNOSIS — E119 Type 2 diabetes mellitus without complications: Secondary | ICD-10-CM

## 2022-03-10 DIAGNOSIS — Z833 Family history of diabetes mellitus: Secondary | ICD-10-CM

## 2022-03-10 DIAGNOSIS — Z20822 Contact with and (suspected) exposure to covid-19: Secondary | ICD-10-CM | POA: Diagnosis present

## 2022-03-10 DIAGNOSIS — J9621 Acute and chronic respiratory failure with hypoxia: Secondary | ICD-10-CM | POA: Diagnosis present

## 2022-03-10 DIAGNOSIS — J441 Chronic obstructive pulmonary disease with (acute) exacerbation: Principal | ICD-10-CM | POA: Diagnosis present

## 2022-03-10 DIAGNOSIS — L309 Dermatitis, unspecified: Secondary | ICD-10-CM | POA: Diagnosis present

## 2022-03-10 DIAGNOSIS — I10 Essential (primary) hypertension: Secondary | ICD-10-CM | POA: Diagnosis present

## 2022-03-10 DIAGNOSIS — Z794 Long term (current) use of insulin: Secondary | ICD-10-CM

## 2022-03-10 DIAGNOSIS — J449 Chronic obstructive pulmonary disease, unspecified: Secondary | ICD-10-CM | POA: Diagnosis not present

## 2022-03-10 DIAGNOSIS — F17210 Nicotine dependence, cigarettes, uncomplicated: Secondary | ICD-10-CM | POA: Diagnosis present

## 2022-03-10 DIAGNOSIS — Z9981 Dependence on supplemental oxygen: Secondary | ICD-10-CM | POA: Diagnosis not present

## 2022-03-10 DIAGNOSIS — K219 Gastro-esophageal reflux disease without esophagitis: Secondary | ICD-10-CM | POA: Diagnosis present

## 2022-03-10 DIAGNOSIS — Z79899 Other long term (current) drug therapy: Secondary | ICD-10-CM

## 2022-03-10 DIAGNOSIS — E1165 Type 2 diabetes mellitus with hyperglycemia: Secondary | ICD-10-CM | POA: Diagnosis present

## 2022-03-10 DIAGNOSIS — J189 Pneumonia, unspecified organism: Secondary | ICD-10-CM | POA: Diagnosis present

## 2022-03-10 DIAGNOSIS — Z8249 Family history of ischemic heart disease and other diseases of the circulatory system: Secondary | ICD-10-CM

## 2022-03-10 DIAGNOSIS — J9620 Acute and chronic respiratory failure, unspecified whether with hypoxia or hypercapnia: Secondary | ICD-10-CM | POA: Diagnosis present

## 2022-03-10 DIAGNOSIS — G894 Chronic pain syndrome: Secondary | ICD-10-CM | POA: Diagnosis present

## 2022-03-10 DIAGNOSIS — J9600 Acute respiratory failure, unspecified whether with hypoxia or hypercapnia: Secondary | ICD-10-CM | POA: Diagnosis not present

## 2022-03-10 LAB — URINALYSIS, ROUTINE W REFLEX MICROSCOPIC
Bacteria, UA: NONE SEEN
Bilirubin Urine: NEGATIVE
Glucose, UA: NEGATIVE mg/dL
Ketones, ur: NEGATIVE mg/dL
Leukocytes,Ua: NEGATIVE
Nitrite: NEGATIVE
Protein, ur: NEGATIVE mg/dL
Specific Gravity, Urine: 1.005 (ref 1.005–1.030)
pH: 6 (ref 5.0–8.0)

## 2022-03-10 LAB — COMPREHENSIVE METABOLIC PANEL
ALT: 14 U/L (ref 0–44)
AST: 20 U/L (ref 15–41)
Albumin: 3.2 g/dL — ABNORMAL LOW (ref 3.5–5.0)
Alkaline Phosphatase: 94 U/L (ref 38–126)
Anion gap: 9 (ref 5–15)
BUN: 6 mg/dL (ref 6–20)
CO2: 37 mmol/L — ABNORMAL HIGH (ref 22–32)
Calcium: 8.9 mg/dL (ref 8.9–10.3)
Chloride: 92 mmol/L — ABNORMAL LOW (ref 98–111)
Creatinine, Ser: 0.36 mg/dL — ABNORMAL LOW (ref 0.44–1.00)
GFR, Estimated: >60 mL/min (ref >60)
Glucose, Bld: 196 mg/dL — ABNORMAL HIGH (ref 70–99)
Potassium: 3.6 mmol/L (ref 3.5–5.1)
Sodium: 138 mmol/L (ref 135–145)
Total Bilirubin: 0.3 mg/dL (ref 0.3–1.2)
Total Protein: 7.5 g/dL (ref 6.5–8.1)

## 2022-03-10 LAB — CBC WITH DIFFERENTIAL/PLATELET
Abs Immature Granulocytes: 0.05 10*3/uL (ref 0.00–0.07)
Basophils Absolute: 0 10*3/uL (ref 0.0–0.1)
Basophils Relative: 0 %
Eosinophils Absolute: 0 10*3/uL (ref 0.0–0.5)
Eosinophils Relative: 0 %
HCT: 38.6 % (ref 36.0–46.0)
Hemoglobin: 11.3 g/dL — ABNORMAL LOW (ref 12.0–15.0)
Immature Granulocytes: 0 %
Lymphocytes Relative: 5 %
Lymphs Abs: 0.6 10*3/uL — ABNORMAL LOW (ref 0.7–4.0)
MCH: 25.7 pg — ABNORMAL LOW (ref 26.0–34.0)
MCHC: 29.3 g/dL — ABNORMAL LOW (ref 30.0–36.0)
MCV: 87.9 fL (ref 80.0–100.0)
Monocytes Absolute: 0.2 10*3/uL (ref 0.1–1.0)
Monocytes Relative: 1 %
Neutro Abs: 12.4 10*3/uL — ABNORMAL HIGH (ref 1.7–7.7)
Neutrophils Relative %: 94 %
Platelets: 390 10*3/uL (ref 150–400)
RBC: 4.39 MIL/uL (ref 3.87–5.11)
RDW: 13.2 % (ref 11.5–15.5)
WBC: 13.3 10*3/uL — ABNORMAL HIGH (ref 4.0–10.5)
nRBC: 0 % (ref 0.0–0.2)

## 2022-03-10 LAB — APTT: aPTT: 25 seconds (ref 24–36)

## 2022-03-10 LAB — BLOOD GAS, VENOUS
Acid-Base Excess: 17.5 mmol/L — ABNORMAL HIGH (ref 0.0–2.0)
Bicarbonate: 44.8 mmol/L — ABNORMAL HIGH (ref 20.0–28.0)
Drawn by: 5678
FIO2: 32 %
O2 Saturation: 88.5 %
Patient temperature: 36.6
pCO2, Ven: 62 mmHg — ABNORMAL HIGH (ref 44–60)
pH, Ven: 7.47 — ABNORMAL HIGH (ref 7.25–7.43)
pO2, Ven: 50 mmHg — ABNORMAL HIGH (ref 32–45)

## 2022-03-10 LAB — PREGNANCY, URINE: Preg Test, Ur: NEGATIVE

## 2022-03-10 LAB — GLUCOSE, CAPILLARY
Glucose-Capillary: 378 mg/dL — ABNORMAL HIGH (ref 70–99)
Glucose-Capillary: 408 mg/dL — ABNORMAL HIGH (ref 70–99)
Glucose-Capillary: 251 mg/dL — ABNORMAL HIGH (ref 70–99)

## 2022-03-10 LAB — LACTIC ACID, PLASMA: Lactic Acid, Venous: 0.9 mmol/L (ref 0.5–1.9)

## 2022-03-10 LAB — PROTIME-INR
INR: 1 (ref 0.8–1.2)
Prothrombin Time: 13.1 seconds (ref 11.4–15.2)

## 2022-03-10 LAB — RESP PANEL BY RT-PCR (FLU A&B, COVID) ARPGX2
Influenza A by PCR: NEGATIVE
Influenza B by PCR: NEGATIVE
SARS Coronavirus 2 by RT PCR: NEGATIVE

## 2022-03-10 MED ORDER — METHYLPREDNISOLONE SODIUM SUCC 125 MG IJ SOLR
125.0000 mg | Freq: Once | INTRAMUSCULAR | Status: AC
  Administered 2022-03-10: 125 mg via INTRAVENOUS
  Filled 2022-03-10: qty 2

## 2022-03-10 MED ORDER — FLUTICASONE PROPIONATE 50 MCG/ACT NA SUSP
2.0000 | Freq: Every day | NASAL | Status: DC
  Administered 2022-03-10 – 2022-03-13 (×4): 2 via NASAL
  Filled 2022-03-10: qty 16

## 2022-03-10 MED ORDER — LACTATED RINGERS IV SOLN: INTRAVENOUS | Status: DC

## 2022-03-10 MED ORDER — IRBESARTAN 150 MG PO TABS
150.0000 mg | ORAL_TABLET | Freq: Every day | ORAL | Status: DC
Start: 2022-03-10 — End: 2022-03-13
  Administered 2022-03-10 – 2022-03-13 (×4): 150 mg via ORAL
  Filled 2022-03-10 (×4): qty 1

## 2022-03-10 MED ORDER — INSULIN GLARGINE-YFGN 100 UNIT/ML ~~LOC~~ SOLN
18.0000 [IU] | Freq: Every day | SUBCUTANEOUS | Status: DC
  Administered 2022-03-10 – 2022-03-11 (×2): 18 [IU] via SUBCUTANEOUS
  Filled 2022-03-10 (×3): qty 0.18

## 2022-03-10 MED ORDER — INSULIN ASPART 100 UNIT/ML IJ SOLN
0.0000 [IU] | Freq: Three times a day (TID) | INTRAMUSCULAR | Status: DC
  Administered 2022-03-10: 15 [IU] via SUBCUTANEOUS
  Administered 2022-03-11: 3 [IU] via SUBCUTANEOUS
  Administered 2022-03-11: 5 [IU] via SUBCUTANEOUS
  Administered 2022-03-11: 11 [IU] via SUBCUTANEOUS
  Administered 2022-03-12: 15 [IU] via SUBCUTANEOUS
  Administered 2022-03-12: 8 [IU] via SUBCUTANEOUS

## 2022-03-10 MED ORDER — VANCOMYCIN HCL IN DEXTROSE 1-5 GM/200ML-% IV SOLN
1000.0000 mg | Freq: Once | INTRAVENOUS | Status: AC
  Administered 2022-03-10: 1000 mg via INTRAVENOUS
  Filled 2022-03-10: qty 200

## 2022-03-10 MED ORDER — OXYCODONE HCL 5 MG PO TABS
5.0000 mg | ORAL_TABLET | Freq: Once | ORAL | Status: AC
  Administered 2022-03-10: 5 mg via ORAL
  Filled 2022-03-10: qty 1

## 2022-03-10 MED ORDER — SODIUM CHLORIDE 0.9 % IV SOLN
500.0000 mg | Freq: Once | INTRAVENOUS | Status: AC
  Administered 2022-03-10: 500 mg via INTRAVENOUS
  Filled 2022-03-10: qty 5

## 2022-03-10 MED ORDER — INSULIN ASPART 100 UNIT/ML IJ SOLN
6.0000 [IU] | Freq: Three times a day (TID) | INTRAMUSCULAR | Status: DC
  Administered 2022-03-10 – 2022-03-12 (×6): 6 [IU] via SUBCUTANEOUS

## 2022-03-10 MED ORDER — SODIUM CHLORIDE 0.9 % IV SOLN
2.0000 g | Freq: Once | INTRAVENOUS | Status: AC
  Administered 2022-03-10: 2 g via INTRAVENOUS
  Filled 2022-03-10: qty 12.5

## 2022-03-10 MED ORDER — INSULIN ASPART 100 UNIT/ML IJ SOLN
0.0000 [IU] | Freq: Every day | INTRAMUSCULAR | Status: DC
  Administered 2022-03-10: 3 [IU] via SUBCUTANEOUS
  Administered 2022-03-11: 5 [IU] via SUBCUTANEOUS

## 2022-03-10 MED ORDER — ONDANSETRON HCL 4 MG PO TABS: 4.0000 mg | ORAL_TABLET | Freq: Four times a day (QID) | ORAL | Status: DC | PRN

## 2022-03-10 MED ORDER — GABAPENTIN 300 MG PO CAPS
300.0000 mg | ORAL_CAPSULE | Freq: Three times a day (TID) | ORAL | Status: DC
  Administered 2022-03-10 – 2022-03-13 (×9): 300 mg via ORAL
  Filled 2022-03-10 (×9): qty 1

## 2022-03-10 MED ORDER — IPRATROPIUM-ALBUTEROL 0.5-2.5 (3) MG/3ML IN SOLN
3.0000 mL | Freq: Four times a day (QID) | RESPIRATORY_TRACT | Status: DC
  Administered 2022-03-10 – 2022-03-12 (×9): 3 mL via RESPIRATORY_TRACT
  Filled 2022-03-10 (×10): qty 3

## 2022-03-10 MED ORDER — HYDROCOD POLI-CHLORPHE POLI ER 10-8 MG/5ML PO SUER: 5.0000 mL | Freq: Two times a day (BID) | ORAL | Status: DC | PRN

## 2022-03-10 MED ORDER — AZITHROMYCIN 250 MG PO TABS
500.0000 mg | ORAL_TABLET | Freq: Every day | ORAL | Status: DC
  Administered 2022-03-11 – 2022-03-13 (×3): 500 mg via ORAL
  Filled 2022-03-10 (×3): qty 2

## 2022-03-10 MED ORDER — PANTOPRAZOLE SODIUM 40 MG PO TBEC
40.0000 mg | DELAYED_RELEASE_TABLET | Freq: Every day | ORAL | Status: DC
  Administered 2022-03-10 – 2022-03-13 (×4): 40 mg via ORAL
  Filled 2022-03-10 (×4): qty 1

## 2022-03-10 MED ORDER — LORATADINE 10 MG PO TABS
10.0000 mg | ORAL_TABLET | Freq: Every day | ORAL | Status: DC
  Administered 2022-03-10 – 2022-03-13 (×4): 10 mg via ORAL
  Filled 2022-03-10 (×4): qty 1

## 2022-03-10 MED ORDER — METHOCARBAMOL 500 MG PO TABS
500.0000 mg | ORAL_TABLET | Freq: Three times a day (TID) | ORAL | Status: DC | PRN
  Administered 2022-03-10 – 2022-03-13 (×7): 500 mg via ORAL
  Filled 2022-03-10 (×7): qty 1

## 2022-03-10 MED ORDER — ACETAMINOPHEN 650 MG RE SUPP: 650.0000 mg | Freq: Four times a day (QID) | RECTAL | Status: DC | PRN

## 2022-03-10 MED ORDER — ALBUTEROL SULFATE (2.5 MG/3ML) 0.083% IN NEBU
INHALATION_SOLUTION | RESPIRATORY_TRACT | Status: AC
  Filled 2022-03-10: qty 12

## 2022-03-10 MED ORDER — VANCOMYCIN HCL 1250 MG/250ML IV SOLN
1250.0000 mg | INTRAVENOUS | Status: DC
  Administered 2022-03-11: 1250 mg via INTRAVENOUS
  Filled 2022-03-10: qty 250

## 2022-03-10 MED ORDER — ROSUVASTATIN CALCIUM 20 MG PO TABS
20.0000 mg | ORAL_TABLET | Freq: Every evening | ORAL | Status: DC
Start: 2022-03-10 — End: 2022-03-13
  Administered 2022-03-10 – 2022-03-12 (×3): 20 mg via ORAL
  Filled 2022-03-10 (×3): qty 1

## 2022-03-10 MED ORDER — SODIUM CHLORIDE 0.9 % IV SOLN
2.0000 g | Freq: Three times a day (TID) | INTRAVENOUS | Status: AC
  Administered 2022-03-10 – 2022-03-12 (×7): 2 g via INTRAVENOUS
  Filled 2022-03-10 (×9): qty 12.5

## 2022-03-10 MED ORDER — INSULIN GLARGINE-YFGN 100 UNIT/ML ~~LOC~~ SOLN
15.0000 [IU] | Freq: Every day | SUBCUTANEOUS | Status: DC
  Filled 2022-03-10 (×2): qty 0.15

## 2022-03-10 MED ORDER — ALBUTEROL (5 MG/ML) CONTINUOUS INHALATION SOLN: 10.0000 mg/h | INHALATION_SOLUTION | Freq: Once | RESPIRATORY_TRACT | Status: DC

## 2022-03-10 MED ORDER — IPRATROPIUM-ALBUTEROL 0.5-2.5 (3) MG/3ML IN SOLN
3.0000 mL | Freq: Once | RESPIRATORY_TRACT | Status: AC
  Administered 2022-03-10: 3 mL via RESPIRATORY_TRACT

## 2022-03-10 MED ORDER — ACETAMINOPHEN 325 MG PO TABS
650.0000 mg | ORAL_TABLET | Freq: Four times a day (QID) | ORAL | Status: DC | PRN
  Administered 2022-03-11 – 2022-03-12 (×2): 650 mg via ORAL
  Filled 2022-03-10 (×2): qty 2

## 2022-03-10 MED ORDER — OXYCODONE HCL 5 MG PO TABS
5.0000 mg | ORAL_TABLET | Freq: Four times a day (QID) | ORAL | Status: DC
  Administered 2022-03-10 (×3): 5 mg via ORAL
  Filled 2022-03-10 (×4): qty 1

## 2022-03-10 MED ORDER — ONDANSETRON HCL 4 MG/2ML IJ SOLN
4.0000 mg | Freq: Four times a day (QID) | INTRAMUSCULAR | Status: DC | PRN
Start: 2022-03-10 — End: 2022-03-13

## 2022-03-10 MED ORDER — NICOTINE 21 MG/24HR TD PT24
21.0000 mg | MEDICATED_PATCH | Freq: Every day | TRANSDERMAL | Status: DC
  Administered 2022-03-10 – 2022-03-13 (×4): 21 mg via TRANSDERMAL
  Filled 2022-03-10 (×4): qty 1

## 2022-03-10 MED ORDER — TRAZODONE HCL 50 MG PO TABS
50.0000 mg | ORAL_TABLET | Freq: Every evening | ORAL | Status: DC | PRN
  Administered 2022-03-11: 50 mg via ORAL
  Filled 2022-03-10: qty 1

## 2022-03-10 MED ORDER — POLYETHYLENE GLYCOL 3350 17 G PO PACK
17.0000 g | PACK | Freq: Every day | ORAL | Status: DC
  Administered 2022-03-10 – 2022-03-13 (×2): 17 g via ORAL
  Filled 2022-03-10 (×3): qty 1

## 2022-03-10 MED ORDER — GUAIFENESIN ER 600 MG PO TB12
1200.0000 mg | ORAL_TABLET | Freq: Two times a day (BID) | ORAL | Status: DC
  Administered 2022-03-10 – 2022-03-11 (×3): 1200 mg via ORAL
  Filled 2022-03-10 (×3): qty 2

## 2022-03-10 MED ORDER — ENOXAPARIN SODIUM 40 MG/0.4ML IJ SOSY
40.0000 mg | PREFILLED_SYRINGE | INTRAMUSCULAR | Status: DC
  Administered 2022-03-10 – 2022-03-12 (×3): 40 mg via SUBCUTANEOUS
  Filled 2022-03-10 (×3): qty 0.4

## 2022-03-10 NOTE — ED Notes (Signed)
Patient stated last night she accidentally wrapped oxygen cord around her  leg and tripped and fell. Noted bruise to right knee ?

## 2022-03-10 NOTE — H&P (Signed)
?History and Physical  ?[employer] ? ?[name] WEX:[id number] DOB: [birth year] DOA: 03/10/2022 ? ?PCP: Ponciano Ort, [facility]  ?Patient coming from: Home  ?Level of care: Med-Surg ? ?I have personally briefly reviewed patient's old medical records in [facility] ? ?Chief Complaint: Shortness of breath  ? ?HPI: [name] is a 56 year old female with longstanding oxygen dependent COPD, followed by pulmonologist Dr. Sherene Sires, chronic pain syndrome, type 2 diabetes mellitus, GAD, ongoing tobacco use, frequent hospitalizations for COPD, was discharged from this hospital last month after a severe CO PD exacerbation and multifocal pneumonia.  She reports that she did fairly well at home for the last several weeks however started having increasing dyspnea over the last 3 days, associated with cough and chest congestion and increasing oxygen requirement.  She is normally on 3 L/min and has increased her home oxygen to 4 L/min due to severe shortness of breath.  She reports that she completed her prednisone taper from recent hospitalization.  She was evaluated in the emergency department today and found to be severely dyspneic with increasing oxygen requirement noted and severe diffuse wheezing.  She was given a continuous albuterol nebulizer and other medications in the ED however her symptoms did not improve enough for her to be able to discharge home.  Patient says that she has no chest pain symptoms.  She reports that she does not feel quite as bad as she did when she was here last month.  She is not at her baseline at this time.  She is talking in short sentences.  Patient was given IV steroids and antibiotics and admission was requested for further management. ? ?Review of Systems: Review of Systems  ?Constitutional:  Positive for diaphoresis and malaise/fatigue. Negative for chills and fever.  ?HENT:  Positive for congestion, sinus pain and sore throat.   ?Eyes: Negative.   ?Respiratory:  Positive for  cough, sputum production, shortness of breath and wheezing.   ?Cardiovascular: Negative.   ?Gastrointestinal:  Positive for heartburn. Negative for abdominal pain, nausea and vomiting.  ?Genitourinary: Negative.   ?Musculoskeletal: Negative.   ?Skin: Negative.   ?Neurological:  Positive for weakness. Negative for loss of consciousness.  ?Endo/Heme/Allergies: Negative.   ?Psychiatric/Behavioral: Negative.    ?All other systems reviewed and are negative. ?  ?Past Medical History:  ?Diagnosis Date  ? Angina   ? Anxiety state 10/15/2015  ? ARDS (adult respiratory distress syndrome) (HCC)   ? Jan 2011  ? Asthma   ? Chronic back pain   ? COPD (chronic obstructive pulmonary disease) (HCC)   ? Diabetes mellitus   ? GERD (gastroesophageal reflux disease) 12/21/2012  ? HTN (hypertension) 10/15/2015  ? Hyperglycemia, drug-induced   ? steroid induced hyperglycemia  ? Migraine headache   ? On home O2   ? Pneumonia   ? Recurrent upper respiratory infection (URI)   ? Shortness of breath   ? ? ?Past Surgical History:  ?Procedure Laterality Date  ? c-section    ? TRACHEOSTOMY    ? decannulated 12/2009  ? TUBAL LIGATION    ? uterine ablasion    ? ? ? reports that she has been smoking cigarettes. She has a 21.00 pack-year smoking history. She has never used smokeless tobacco. She reports that she does not drink alcohol and does not use drugs. ? ?Allergies  ?Allergen Reactions  ? Levofloxacin Anaphylaxis  ? Levofloxacin In D5w Itching, Swelling and Rash  ?  IV only ?IV only  ?  Penicillins Other (See Comments)  ?  convulsions  ? Doxycycline Nausea And Vomiting  ? Morphine Nausea Only  ? ? ?Family History  ?Problem Relation Age of Onset  ? Coronary artery disease Brother   ? Diabetes Other   ? Cancer Other   ? Hypertension Other   ? ? ?Prior to Admission medications   ?Medication Sig Start Date End Date Taking? Authorizing Provider  ?acetaminophen (TYLENOL) 500 MG tablet Take 500 mg by mouth every 6 (six) hours as needed for fever or mild  pain.   Yes [provider]  ?albuterol (PROVENTIL) (2.5 MG/3ML) 0.083% nebulizer solution Take 3 mLs (2.5 mg total) by nebulization every 6 (six) hours as needed for wheezing or shortness of breath. 01/26/22  Yes Isael Stille L, MD  ?albuterol (VENTOLIN HFA) 108 (90 Base) MCG/ACT inhaler Inhale 2 puffs into the lungs every 4 (four) hours as needed for wheezing or shortness of breath. 08/10/20  Yes [provider]  ?fluticasone (FLONASE) 50 MCG/ACT nasal spray Place 2 sprays into both nostrils daily. 04/13/18  Yes [provider]  ?gabapentin (NEURONTIN) 300 MG capsule Take 300 mg by mouth 3 (three) times daily. 07/31/21  Yes [provider]  ?Insulin Glargine (LANTUS) 100 UNIT/ML Solostar Pen Inject 15 Units into the skin daily. 12/02/19  Yes Dovie Kapusta L, MD  ?ipratropium-albuterol (DUONEB) 0.5-2.5 (3) MG/3ML SOLN Take 3 mLs by nebulization every 6 (six) hours as needed (shortness of breath). 10/31/21  Yes [provider]  ?irbesartan (AVAPRO) 150 MG tablet TAKE 1 TABLET BY MOUTH EVERY DAY ?Patient taking differently: Take 150 mg by mouth daily. 05/29/21  Yes Nyoka CowdenWert, Michael B, MD  ?loratadine (CLARITIN) 10 MG tablet Take 10 mg by mouth daily. 08/13/21  Yes [provider]  ?metFORMIN (GLUCOPHAGE) 500 MG tablet Take 2 tablets (1,000 mg total) by mouth 2 (two) times daily. ?Patient taking differently: Take 500 mg by mouth 2 (two) times daily. 01/04/20  Yes Corum, Minerva FesterLisa L, MD  ?methocarbamol (ROBAXIN) 500 MG tablet Take 500 mg by mouth every 8 (eight) hours as needed for muscle spasms. 08/13/21  Yes [provider]  ?ondansetron (ZOFRAN-ODT) 4 MG disintegrating tablet Take by mouth. 01/21/22  Yes [provider]  ?oxyCODONE (OXY IR/ROXICODONE) 5 MG immediate release tablet Take 5 mg by mouth 4 (four) times daily. 11/20/21  Yes [provider]  ?pantoprazole (PROTONIX) 40 MG tablet Take 1 tablet (40 mg total) by mouth daily. 03/11/20  Yes Corum,  Minerva FesterLisa L, MD  ?polyethylene glycol (MIRALAX / GLYCOLAX) 17 g packet Take 17 g by mouth daily. 01/26/22  Yes Jontue Crumpacker L, MD  ?rosuvastatin (CRESTOR) 20 MG tablet Take 20 mg by mouth daily. 06/02/21  Yes [provider]  ?SUMAtriptan (IMITREX) 50 MG tablet Take 50 mg by mouth as needed for migraine or headache. 11/29/21  Yes [provider]  ?chlorpheniramine-HYDROcodone 10-8 MG/5ML Take 5 mLs by mouth every 12 (twelve) hours as needed for cough. ?Patient not taking: Reported on 03/10/2022 01/26/22   Standley DakinsJohnson, Chaylee Ehrsam L, MD  ?glucose blood test strip 1 each by Other route 3 (three) times daily. Use as instructed, accucheck test strips 01/04/20   Corum, Minerva FesterLisa L, MD  ?Insulin Pen Needle 31G X 5 MM MISC Use as directed 03/12/20   Wandra Feinsteinorum, Lisa L, MD  ?predniSONE (DELTASONE) 20 MG tablet Take 2 PO QAM x5 days then 1 po QAM x 3 days ?Patient not taking: Reported on 03/10/2022 01/27/22   Laural BenesJohnson, Taffie Eckmann L,  MD  ?ARIPiprazole (ABILIFY) 5 MG tablet Take 5 mg by mouth daily.    02/03/12  [provider]  ?venlafaxine (EFFEXOR) 75 MG tablet Take 75 mg by mouth daily.    02/03/12  [provider]  ? ? ?Physical Exam: ?Vitals:  ? 03/10/22 0911 03/10/22 1038 03/10/22 1100 03/10/22 1301  ?BP: 122/73 111/75 115/78 121/74  ?Pulse: 98 91 94 98  ?Resp: 16 20 (!) 21 (!) 29  ?Temp: 97.8 ?F (36.6 ?C)  98 ?F (36.7 ?C)   ?TempSrc:   Oral   ?SpO2: 98% 91% 92% 95%  ?Weight:      ?Height:      ? ?Constitutional: Patient appears chronically ill, NAD, calm, comfortable ?Eyes: PERRL, lids and conjunctivae normal. ?ENMT: Mucous membranes are moist. Posterior pharynx clear of any exudate or lesions.Normal dentition.  ?Neck: normal, supple, no masses, no thyromegaly.   ?Respiratory: diffuse bilateral expiratory wheezing, no crackles. Moderate to severe increased work of breathing with accessory muscle use.  ?Cardiovascular: normal s1, s2 sounds, no murmurs / rubs / gallops. No extremity edema. 2+ pedal pulses. No carotid  bruits.  ?Abdomen: no tenderness, no masses palpated. No hepatosplenomegaly. Bowel sounds positive.  ?Musculoskeletal: no clubbing / cyanosis. No joint deformity upper and lower extremities. Good ROM, n

## 2022-03-10 NOTE — Assessment & Plan Note (Signed)
--  protonix ordered for GI protection  ?

## 2022-03-10 NOTE — Hospital Course (Addendum)
56 year old female with longstanding oxygen dependent COPD group D, followed by pulmonologist Dr. Sherene Sires, chronic pain syndrome, type 2 diabetes mellitus, GAD, ongoing tobacco use, frequent hospitalizations for COPD, was discharged from this hospital last month after a severe CO PD exacerbation and multifocal pneumonia.  She reports that she did fairly well at home for the last several weeks however started having increasing dyspnea over the last 3 days, associated with cough and chest congestion and increasing oxygen requirement.  She is normally on 3 L/min and has increased her home oxygen to 4 L/min due to severe shortness of breath.  She reports that she completed her prednisone taper from recent hospitalization.  She was evaluated in the emergency department today and found to be severely dyspneic with increasing oxygen requirement noted and severe diffuse wheezing.  She was given a continuous albuterol nebulizer and other medications in the ED however her symptoms did not improve enough for her to be able to discharge home.  Patient says that she has no chest pain symptoms.  She reports that she does not feel quite as bad as she did when she was here last month.  She is not at her baseline at this time.  She is talking in short sentences.  Patient was given IV steroids and antibiotics and admission was requested for further management. ?

## 2022-03-10 NOTE — Assessment & Plan Note (Signed)
--   continue IV steroids, antibiotics, scheduled bronchodilators.   ?-- wean oxygen to baseline requirements as able.   ?-- ongoing counseling on tobacco cessation including avoiding secondhand smoke exposure as well.   ?

## 2022-03-10 NOTE — Assessment & Plan Note (Signed)
--   Pt has a new oxygen requirement, likely exacerbated by COPD exacerbation.  She desaturated down to 80% on 3L/min and was placed on 4L/min with improvement in oxygenation.  ?-- Treating COPD exacerbation aggressively.   ?

## 2022-03-10 NOTE — Progress Notes (Signed)
Notified provider of need to order antibiotics.   

## 2022-03-10 NOTE — Assessment & Plan Note (Signed)
--   Patient is followed closely in the outpatient clinic with Dr. Melvyn Novas ?-- We will make arrangements for patient to follow-up after discharge ?-- If symptoms not improving as expected will consider inpatient pulmonary consultation ?

## 2022-03-10 NOTE — Progress Notes (Signed)
Pt refused treatment at this time want to wait til her company leaves Rt will come back when available ?

## 2022-03-10 NOTE — Progress Notes (Signed)
Pharmacy Antibiotic Note ? ?[name] is a 56 y.o. female admitted on 03/10/2022 with sepsis.  Pharmacy has been consulted for cefepime and vanc dosing. Noted PCN allergy, however patient has received cephalosporins successfully in the past.  ? ?Plan: ?Cefepime 2gm IV q8h ?Vancomycin 1250mg  IV q 24h ? ?Height: 5' (152.4 cm) ?Weight: 58.5 kg (129 lb) ?IBW/kg (Calculated) : 45.5 ?Vd 42, Ke= 0.053, calculated AUC = 561 ? ?Temp (24hrs), Avg:97.8 ?F (36.6 ?C), Min:97.8 ?F (36.6 ?C), Max:97.8 ?F (36.6 ?C) ? ?Recent Labs  ?Lab 03/10/22 ?03/12/22  ?WBC 13.3*  ?CREATININE 0.36*  ?LATICACIDVEN 0.9  ?  ?Estimated Creatinine Clearance: 65.1 mL/min (A) (by C-G formula based on SCr of 0.36 mg/dL (L)).   ? ?Allergies  ?Allergen Reactions  ? Levofloxacin Anaphylaxis  ? Levofloxacin In D5w Itching, Swelling and Rash  ?  IV only ?IV only  ? Penicillins Other (See Comments)  ?  convulsions  ? Doxycycline Nausea And Vomiting  ? Morphine Nausea Only  ? ? ?Microbiology results: ?4/18 Blood cultures Pending ? ?Thank you for allowing pharmacy to be a part of this patient?s care. ? ?5/18, PharmD ?Clinical Pharmacist ?03/10/2022 11:01 AM ? ? ? ? ? ? ?

## 2022-03-10 NOTE — Progress Notes (Signed)
Elink following code sepsis °

## 2022-03-10 NOTE — Assessment & Plan Note (Signed)
--   not sure this is an active infection, will check procalcitonin ?-- continue broad spectrum antibiotic coverage for now ?-- SEPSIS HAS BEEN RULED OUT ?

## 2022-03-10 NOTE — ED Provider Notes (Addendum)
?[facility] EMERGENCY DEPARTMENT ?Provider Note ? ? ?CSN: [id number] ?Arrival date & time: 03/10/22  0754 ? ?  ? ?History ? ?Chief Complaint  ?Patient presents with  ? Shortness of Breath  ? ? ?[name] is a 56 y.o. female. ? ?HPI ? ?  ? ?56 year old female with history of COPD, chronic respiratory failure on 3 L of oxygen, chronic pain syndrome, diabetes, tobacco use disorder comes in with chief complaint of cough and shortness of breath. ? ?Patient indicates that she has been having increasing shortness of breath with cough over the last 3 days.  Cough is producing green phlegm.  At home her O2 sats has dropped despite being on 3 L, yesterday she started using 4 L of oxygen. ? ?Review of system is positive for fevers.  Patient indicates that she had a temp of 102 yesterday. ? ?She denies any chest pain.  Shortness of breath present at rest and also with exertion.  She has taken nebulizer treatment with minimal relief. ? ?Home Medications ?Prior to Admission medications   ?Medication Sig Start Date End Date Taking? Authorizing Provider  ?acetaminophen (TYLENOL) 500 MG tablet Take 500 mg by mouth every 6 (six) hours as needed for fever.     [provider]  ?albuterol (PROVENTIL) (2.5 MG/3ML) 0.083% nebulizer solution Take 3 mLs (2.5 mg total) by nebulization every 6 (six) hours as needed for wheezing or shortness of breath. 01/26/22   Johnson, Clanford L, MD  ?albuterol (VENTOLIN HFA) 108 (90 Base) MCG/ACT inhaler Inhale 2 puffs into the lungs every 4 (four) hours as needed for wheezing or shortness of breath. 08/10/20   [provider]  ?chlorpheniramine-HYDROcodone 10-8 MG/5ML Take 5 mLs by mouth every 12 (twelve) hours as needed for cough. ?Patient not taking: Reported on 03/10/2022 01/26/22   Cleora Fleet, MD  ?fluticasone (FLONASE) 50 MCG/ACT nasal spray Place 2 sprays into both nostrils daily. 04/13/18   [provider]  ?gabapentin (NEURONTIN) 300 MG capsule Take 300 mg by  mouth 3 (three) times daily. 07/31/21   [provider]  ?glucose blood test strip 1 each by Other route 3 (three) times daily. Use as instructed, accucheck test strips 01/04/20   Corum, Minerva Fester, MD  ?Insulin Glargine (LANTUS) 100 UNIT/ML Solostar Pen Inject 15 Units into the skin daily. 12/02/19   Cleora Fleet, MD  ?Insulin Pen Needle 31G X 5 MM MISC Use as directed 03/12/20   Wandra Feinstein, MD  ?ipratropium-albuterol (DUONEB) 0.5-2.5 (3) MG/3ML SOLN Take 3 mLs by nebulization See admin instructions. Use 1 vial  every 6 to 8 hours as needed for shortness of breath 10/31/21   [provider]  ?irbesartan (AVAPRO) 150 MG tablet TAKE 1 TABLET BY MOUTH EVERY DAY 05/29/21   Nyoka Cowden, MD  ?loratadine (CLARITIN) 10 MG tablet Take 10 mg by mouth daily. 08/13/21   [provider]  ?metFORMIN (GLUCOPHAGE) 500 MG tablet Take 2 tablets (1,000 mg total) by mouth 2 (two) times daily. 01/04/20   Corum, Minerva Fester, MD  ?methocarbamol (ROBAXIN) 500 MG tablet Take 500 mg by mouth every 8 (eight) hours as needed. 08/13/21   [provider]  ?ondansetron (ZOFRAN-ODT) 4 MG disintegrating tablet Take by mouth. 01/21/22   [provider]  ?oxyCODONE (OXY IR/ROXICODONE) 5 MG immediate release tablet Take 5 mg by mouth 4 (four) times daily. 11/20/21   [provider]  ?pantoprazole (PROTONIX) 40 MG tablet Take 1 tablet (40 mg total)  by mouth daily. 03/11/20   Corum, Minerva FesterLisa L, MD  ?polyethylene glycol (MIRALAX / GLYCOLAX) 17 g packet Take 17 g by mouth daily. 01/26/22   Johnson, Clanford L, MD  ?predniSONE (DELTASONE) 20 MG tablet Take 2 PO QAM x5 days then 1 po QAM x 3 days ?Patient not taking: Reported on 03/10/2022 01/27/22   Cleora FleetJohnson, Clanford L, MD  ?rosuvastatin (CRESTOR) 20 MG tablet Take 20 mg by mouth daily. 06/02/21   [provider]  ?SUMAtriptan (IMITREX) 50 MG tablet Take 50 mg by mouth as needed. 11/29/21   [provider]  ?ARIPiprazole (ABILIFY) 5 MG tablet Take 5 mg by  mouth daily.    02/03/12  [provider]  ?venlafaxine (EFFEXOR) 75 MG tablet Take 75 mg by mouth daily.    02/03/12  [provider]  ?   ? ?Allergies    ?Levofloxacin, Levofloxacin in d5w, Penicillins, Doxycycline, and Morphine   ? ?Review of Systems   ?Review of Systems  ?All other systems reviewed and are negative. ? ?Physical Exam ?Updated Vital Signs ?BP 111/75   Pulse 91   Temp 97.8 ?F (36.6 ?C)   Resp 20   Ht 5' (1.524 m)   Wt 58.5 kg   SpO2 91%   BMI 25.19 kg/m?  ?Physical Exam ?Vitals and nursing note reviewed.  ?Constitutional:   ?   Appearance: She is well-developed.  ?HENT:  ?   Head: Atraumatic.  ?Cardiovascular:  ?   Rate and Rhythm: Tachycardia present.  ?Pulmonary:  ?   Effort: Pulmonary effort is normal.  ?   Breath sounds: Wheezing and rhonchi present.  ?Musculoskeletal:  ?   Cervical back: Normal range of motion and neck supple.  ?Skin: ?   General: Skin is warm and dry.  ?Neurological:  ?   Mental Status: She is alert and oriented to person, place, and time.  ? ? ?ED Results / Procedures / Treatments   ?Labs ?(all labs ordered are listed, but only abnormal results are displayed) ?Labs Reviewed  ?COMPREHENSIVE METABOLIC PANEL - Abnormal; Notable for the following components:  ?    Result Value  ? Chloride 92 (*)   ? CO2 37 (*)   ? Glucose, Bld 196 (*)   ? Creatinine, Ser 0.36 (*)   ? Albumin 3.2 (*)   ? All other components within normal limits  ?CBC WITH DIFFERENTIAL/PLATELET - Abnormal; Notable for the following components:  ? WBC 13.3 (*)   ? Hemoglobin 11.3 (*)   ? MCH 25.7 (*)   ? MCHC 29.3 (*)   ? Neutro Abs 12.4 (*)   ? Lymphs Abs 0.6 (*)   ? All other components within normal limits  ?URINALYSIS, ROUTINE W REFLEX MICROSCOPIC - Abnormal; Notable for the following components:  ? Hgb urine dipstick SMALL (*)   ? All other components within normal limits  ?BLOOD GAS, VENOUS - Abnormal; Notable for the following components:  ? pH, Ven 7.47 (*)   ? pCO2, Ven 62 (*)   ?  pO2, Ven 50 (*)   ? Bicarbonate 44.8 (*)   ? Acid-Base Excess 17.5 (*)   ? All other components within normal limits  ?RESP PANEL BY RT-PCR (FLU A&B, COVID) [id number]  ?CULTURE, BLOOD (ROUTINE X 2)  ?CULTURE, BLOOD (ROUTINE X 2)  ?LACTIC ACID, PLASMA  ?PROTIME-INR  ?APTT  ?LACTIC ACID, PLASMA  ?POC URINE PREG, ED  ? ? ?EKG ?None ? ?Radiology ?DG Chest Port 1 View ? ?Result Date:  03/10/2022 ?CLINICAL DATA:  Possible sepsis, shortness of breath EXAM: PORTABLE CHEST 1 VIEW COMPARISON:  01/22/2022, 08/16/2021 FINDINGS: The heart size and mediastinal contours are within normal limits. Widespread reticulonodular opacities throughout both lungs, similar in appearance to prior. No pleural effusion or pneumothorax. The visualized skeletal structures are unremarkable. IMPRESSION: Widespread reticulonodular opacities throughout both lungs, likely infectious or inflammatory. Overall appearance is similar to prior. Electronically Signed   By: Duanne Guess D.O.   On: 03/10/2022 09:15   ? ?Procedures ?Marland KitchenCritical Care ?Performed by: Derwood Kaplan, MD ?Authorized by: Derwood Kaplan, MD  ? ?Critical care provider statement:  ?  Critical care time (minutes):  32 ?  Critical care was necessary to treat or prevent imminent or life-threatening deterioration of the following conditions:  Respiratory failure ?  Critical care was time spent personally by me on the following activities:  Development of treatment plan with patient or surrogate, discussions with consultants, evaluation of patient's response to treatment, examination of patient, ordering and review of laboratory studies, ordering and review of radiographic studies, ordering and performing treatments and interventions, pulse oximetry, re-evaluation of patient's condition and review of old charts  ? ? ?Medications Ordered in ED ?Medications  ?lactated ringers infusion ( Intravenous New Bag/Given 03/10/22 0916)  ?ceFEPIme (MAXIPIME) 2 g in sodium chloride 0.9 % 100 mL IVPB (has  no administration in time range)  ?vancomycin (VANCOREADY) IVPB 1250 mg/250 mL (has no administration in time range)  ?methylPREDNISolone sodium succinate (SOLU-MEDROL) 125 mg/2 mL injection 125 mg (has no adminis

## 2022-03-10 NOTE — ED Triage Notes (Signed)
Patient presents to ED via [facility] with c/o shortness of breath for 3 days progressively getting worse. Patient has a hx of COPD and tobacco use. O2 sat 90% on 3 liters.  ?

## 2022-03-10 NOTE — Assessment & Plan Note (Signed)
--   will offer nicotine patch ?-- counseled at length on need for tobacco cessation  ?

## 2022-03-10 NOTE — Assessment & Plan Note (Signed)
--   During prior hospitalizations, patient has had severe steroid-induced hyperglycemia, will need to work to avoid this if possible, aggressive SSI coverage and prandial coverage ordered, frequent CBG monitoring ordered. ?

## 2022-03-11 DIAGNOSIS — J441 Chronic obstructive pulmonary disease with (acute) exacerbation: Secondary | ICD-10-CM | POA: Diagnosis not present

## 2022-03-11 LAB — COMPREHENSIVE METABOLIC PANEL
ALT: 11 U/L (ref 0–44)
AST: 15 U/L (ref 15–41)
Albumin: 2.7 g/dL — ABNORMAL LOW (ref 3.5–5.0)
Alkaline Phosphatase: 74 U/L (ref 38–126)
Anion gap: 5 (ref 5–15)
BUN: 10 mg/dL (ref 6–20)
CO2: 38 mmol/L — ABNORMAL HIGH (ref 22–32)
Calcium: 8.8 mg/dL — ABNORMAL LOW (ref 8.9–10.3)
Chloride: 95 mmol/L — ABNORMAL LOW (ref 98–111)
Creatinine, Ser: 0.35 mg/dL — ABNORMAL LOW (ref 0.44–1.00)
GFR, Estimated: >60 mL/min (ref >60)
Glucose, Bld: 269 mg/dL — ABNORMAL HIGH (ref 70–99)
Potassium: 3.7 mmol/L (ref 3.5–5.1)
Sodium: 138 mmol/L (ref 135–145)
Total Bilirubin: 0.2 mg/dL — ABNORMAL LOW (ref 0.3–1.2)
Total Protein: 6.5 g/dL (ref 6.5–8.1)

## 2022-03-11 LAB — GLUCOSE, CAPILLARY
Glucose-Capillary: 213 mg/dL — ABNORMAL HIGH (ref 70–99)
Glucose-Capillary: 177 mg/dL — ABNORMAL HIGH (ref 70–99)
Glucose-Capillary: 160 mg/dL — ABNORMAL HIGH (ref 70–99)
Glucose-Capillary: 321 mg/dL — ABNORMAL HIGH (ref 70–99)
Glucose-Capillary: 431 mg/dL — ABNORMAL HIGH (ref 70–99)

## 2022-03-11 LAB — CBC WITH DIFFERENTIAL/PLATELET
Abs Immature Granulocytes: 0.05 10*3/uL (ref 0.00–0.07)
Basophils Absolute: 0 10*3/uL (ref 0.0–0.1)
Basophils Relative: 0 %
Eosinophils Absolute: 0 10*3/uL (ref 0.0–0.5)
Eosinophils Relative: 0 %
HCT: 32.8 % — ABNORMAL LOW (ref 36.0–46.0)
Hemoglobin: 9.9 g/dL — ABNORMAL LOW (ref 12.0–15.0)
Immature Granulocytes: 0 %
Lymphocytes Relative: 16 %
Lymphs Abs: 2 10*3/uL (ref 0.7–4.0)
MCH: 26.5 pg (ref 26.0–34.0)
MCHC: 30.2 g/dL (ref 30.0–36.0)
MCV: 87.7 fL (ref 80.0–100.0)
Monocytes Absolute: 1.2 10*3/uL — ABNORMAL HIGH (ref 0.1–1.0)
Monocytes Relative: 9 %
Neutro Abs: 9.7 10*3/uL — ABNORMAL HIGH (ref 1.7–7.7)
Neutrophils Relative %: 75 %
Platelets: 421 10*3/uL — ABNORMAL HIGH (ref 150–400)
RBC: 3.74 MIL/uL — ABNORMAL LOW (ref 3.87–5.11)
RDW: 13.2 % (ref 11.5–15.5)
WBC: 12.9 10*3/uL — ABNORMAL HIGH (ref 4.0–10.5)
nRBC: 0 % (ref 0.0–0.2)

## 2022-03-11 LAB — PROCALCITONIN: Procalcitonin: <0.1 ng/mL

## 2022-03-11 LAB — MRSA NEXT GEN BY PCR, NASAL: MRSA by PCR Next Gen: NOT DETECTED

## 2022-03-11 LAB — MAGNESIUM: Magnesium: 2.2 mg/dL (ref 1.7–2.4)

## 2022-03-11 MED ORDER — OXYCODONE HCL 5 MG PO TABS: 5.0000 mg | ORAL_TABLET | Freq: Once | ORAL | Status: DC

## 2022-03-11 MED ORDER — DM-GUAIFENESIN ER 30-600 MG PO TB12
1.0000 | ORAL_TABLET | Freq: Two times a day (BID) | ORAL | Status: DC
Start: 2022-03-11 — End: 2022-03-13
  Administered 2022-03-11 – 2022-03-13 (×5): 1 via ORAL
  Filled 2022-03-11 (×5): qty 1

## 2022-03-11 MED ORDER — METHYLPREDNISOLONE SODIUM SUCC 40 MG IJ SOLR
40.0000 mg | Freq: Two times a day (BID) | INTRAMUSCULAR | Status: DC
  Administered 2022-03-11 – 2022-03-13 (×5): 40 mg via INTRAVENOUS
  Filled 2022-03-11 (×5): qty 1

## 2022-03-11 MED ORDER — ALBUTEROL SULFATE (2.5 MG/3ML) 0.083% IN NEBU
2.5000 mg | INHALATION_SOLUTION | RESPIRATORY_TRACT | Status: DC | PRN
Start: 2022-03-11 — End: 2022-03-13
  Filled 2022-03-11: qty 3

## 2022-03-11 MED ORDER — INSULIN GLARGINE-YFGN 100 UNIT/ML ~~LOC~~ SOLN
20.0000 [IU] | Freq: Every day | SUBCUTANEOUS | Status: DC
  Administered 2022-03-11: 20 [IU] via SUBCUTANEOUS
  Filled 2022-03-11 (×2): qty 0.2

## 2022-03-11 MED ORDER — OXYCODONE HCL 5 MG PO TABS
5.0000 mg | ORAL_TABLET | Freq: Four times a day (QID) | ORAL | Status: DC
  Administered 2022-03-11 – 2022-03-13 (×10): 5 mg via ORAL
  Filled 2022-03-11 (×10): qty 1

## 2022-03-11 NOTE — Progress Notes (Signed)
Patient c/o feeling light headed, checked her sugar it was 177. IV to LFA is infiltrated, removed, She stat4es her face was tingling and hard to breathe through her nose. Stopped the vancomycin . Notified Dr. Marisa Severin  ? ?

## 2022-03-11 NOTE — Progress Notes (Signed)
Inpatient Diabetes Program Recommendations ? ?AACE/ADA: New Consensus Statement on Inpatient Glycemic Control ? ?Target Ranges:  Prepandial:   less than 140 mg/dL ?     Peak postprandial:   less than 180 mg/dL (1-2 hours) ?     Critically ill patients:  140 - 180 mg/dL  ? ? Latest Reference Range & Units 03/10/22 14:45 03/10/22 17:25 03/10/22 22:10 03/11/22 07:24  ?Glucose-Capillary 70 - 99 mg/dL 093 (H) 235 (H) 573 (H) 213 (H)  ? ? ?Review of Glycemic Control ? ?Diabetes history: DM2 ?Outpatient Diabetes medications: Lantus 15 units daily, Metformin 1000 mg BID ?Current orders for Inpatient glycemic control: Semglee 18 units daily, Novolog 0-15  units TID with meals, Novolog 0-5 units QHS, Novolog 6 units TID with meals; Solumedrol 40 mg Q12H ? ? ?Inpatient Diabetes Program Recommendations:   ? ?Insulin: If steroids are continued as ordered, please consider increasing Semglee to 25 units daily and increase meal coverage to Novolog 10 units TID with meals. ? ?Thanks, ?Orlando Penner, RN, MSN, CDE ?Diabetes Coordinator ?Inpatient Diabetes Program ?[phone] (Team Pager from 8am to 5pm) ? ? ? ?

## 2022-03-11 NOTE — TOC Initial Note (Signed)
Transition of Care (TOC) - Initial/Assessment Note  ? ? ?Patient Details  ?Name: [name] ?MRN: [id number] ?Date of Birth: 1966-03-18 ? ?Transition of Care (TOC) CM/SW Contact:    ?Villa Herb, LCSWA ?Phone Number: ?03/11/2022, 9:32 AM ? ?Clinical Narrative:                 ?Pt is high risk for readmission. CSW spoke with pt to complete assessment. Pt states that she lives with her 2 daughters. Pt is independent in completing her ADLs. Pt states that she does not drive but that her daughters can provide transportation as needed. Pt has not had HH in the past and pt does not use any DME. PT wear home O2 and it is supplied through [employer]. TOC to follow.  ? ?Expected Discharge Plan: Home/Self Care ?Barriers to Discharge: Continued Medical Work up ? ? ?Patient Goals and CMS Choice ?Patient states their goals for this hospitalization and ongoing recovery are:: return home ?CMS Medicare.gov Compare Post Acute Care list provided to:: Patient ?Choice offered to / list presented to : Patient ? ?Expected Discharge Plan and Services ?Expected Discharge Plan: Home/Self Care ?In-house Referral: Clinical Social Work ?Discharge Planning Services: CM Consult ?  ?Living arrangements for the past 2 months: Single Family Home ?                ?  ?  ?  ?  ?  ?  ?  ?  ?  ?  ? ?Prior Living Arrangements/Services ?Living arrangements for the past 2 months: Single Family Home ?Lives with:: Self, Minor Children ?Patient language and need for interpreter reviewed:: Yes ?Do you feel safe going back to the place where you live?: Yes      ?Need for Family Participation in Patient Care: Yes (Comment) ?Care giver support system in place?: Yes (comment) ?Current home services: DME (O2) ?Criminal Activity/Legal Involvement Pertinent to Current Situation/Hospitalization: No - Comment as needed ? ?Activities of Daily Living ?Home Assistive Devices/Equipment: Oxygen, Eyeglasses ?ADL Screening (condition at time of admission) ?Patient's  cognitive ability adequate to safely complete daily activities?: Yes ?Is the patient deaf or have difficulty hearing?: No ?Does the patient have difficulty seeing, even when wearing glasses/contacts?: No ?Does the patient have difficulty concentrating, remembering, or making decisions?: No ?Patient able to express need for assistance with ADLs?: No ?Does the patient have difficulty dressing or bathing?: No ?Independently performs ADLs?: Yes (appropriate for developmental age) ?Does the patient have difficulty walking or climbing stairs?: Yes ?Weakness of Legs: None ?Weakness of Arms/Hands: None ? ?Permission Sought/Granted ?  ?  ?   ?   ?   ?   ? ?Emotional Assessment ?Appearance:: Appears stated age ?Attitude/Demeanor/Rapport: Engaged ?Affect (typically observed): Accepting ?Orientation: : Oriented to Self, Oriented to Place, Oriented to  Time, Oriented to Situation ?Alcohol / Substance Use: Not Applicable ?Psych Involvement: No (comment) ? ?Admission diagnosis:  COPD with acute exacerbation (HCC) [J44.1] ?Acute on chronic respiratory failure with hypoxia (HCC) [J96.21] ?Community acquired pneumonia, unspecified laterality [J18.9] ?Patient Active Problem List  ? Diagnosis Date Noted  ? Acute hypercapnic respiratory failure (HCC) 01/22/2022  ? Abnormal gall bladder diagnostic imaging   ? Sepsis due to undetermined organism (HCC) 08/17/2021  ? Chronic respiratory failure with hypoxia and hypercapnia (HCC) 04/23/2020  ? Hoarseness or changing voice 01/11/2020  ? Nausea and vomiting 01/11/2020  ? Acute on chronic respiratory failure with hypoxia and hypercapnia (HCC) 11/27/2019  ? Chronic pain syndrome 11/27/2019  ?  Lobar pneumonia (HCC) 11/27/2019  ? Atypical pneumonia - SEPSIS RULED OUT 09/28/2016  ? Chronic obstructive pulmonary disease (HCC)   ? Pneumonia 05/30/2016  ? SIRS (systemic inflammatory response syndrome) (HCC) 10/16/2015  ? Sepsis (HCC) 10/15/2015  ? Anxiety state 10/15/2015  ? Essential hypertension  10/15/2015  ? Hypokalemia 10/15/2015  ? COPD with acute exacerbation (HCC) 08/14/2014  ? Acute-on-chronic respiratory failure (HCC) 03/13/2014  ? Multifocal Pneumonia 03/11/2014  ? Community acquired pneumonia 03/11/2014  ? COPD exacerbation (HCC) 04/22/2013  ? GERD (gastroesophageal reflux disease) 12/21/2012  ? Constipation 12/21/2012  ? Healthcare-associated pneumonia 02/03/2012  ? Candida infection 11/26/2011  ? Viral syndrome 11/21/2011  ? DM (diabetes mellitus) (HCC) 11/21/2011  ? Oral candidiasis 07/03/2011  ? Low back pain 06/16/2011  ? Muscle spasms of lower extremity 06/16/2011  ? Cigarette smoker 09/12/2010  ? 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  02/14/2010  ? UNSPECIFIED NUTRITIONAL DEFICIENCY 02/13/2010  ? ADULT RESPIRATORY DISTRESS SYNDROME 02/13/2010  ? DELIRIUM 02/13/2010  ? ?PCP:  Ponciano Ort, [facility] ?Pharmacy:   ?[id number] - [place], [place] - [address] ?[address] ?[place] [address] ?Phone: [phone] Fax: [phone] ? ?[facility] - [place], [place] - [address] ?[address] ? [place] [address] ?Phone: [phone] Fax: [phone] ? ? ? ? ?Social Determinants of Health (SDOH) Interventions ?  ? ?Readmission Risk Interventions ? ?  03/11/2022  ?  9:30 AM  ?Readmission Risk Prevention Plan  ?Transportation Screening Complete  ?HRI or Home Care Consult Complete  ?Social Work Consult for Recovery Care Planning/Counseling Complete  ?Palliative Care Screening Not Applicable  ?Medication Review Oceanographer) Complete  ? ? ? ?

## 2022-03-11 NOTE — Plan of Care (Signed)

## 2022-03-11 NOTE — Progress Notes (Signed)
?   03/11/22 1038  ?Vitals  ?Temp (!) 97.4 ?F (36.3 ?C)  ?Temp Source Oral  ?BP 123/73  ?Pulse Rate 80  ?Resp 20  ?Level of Consciousness  ?Level of Consciousness Alert  ?Oxygen Therapy  ?SpO2 95 %  ?O2 Device Nasal Cannula  ?O2 Flow Rate (L/min) 3 L/min  ? ? ?

## 2022-03-11 NOTE — Progress Notes (Signed)
?PROGRESS NOTE ? ? ? ? ?[name], is a 56 y.o. female, DOB - 06/17/1966, PIR:[id number] ? ?Admit date - 03/10/2022   Admitting Physician Cleora Fleet, MD ? ?Outpatient Primary MD for the patient is [facility], [facility] ? ?LOS - 1 ? ?Chief Complaint  ?Patient presents with  ? Shortness of Breath  ?    ? ? ?Brief Narrative:  ? ?56 year old female with longstanding oxygen dependent COPD group D, followed by pulmonologist Dr. Sherene Sires, chronic pain syndrome, type 2 diabetes mellitus, GAD, ongoing tobacco use, frequent hospitalizations for COPD, was discharged from this hospital last month after a severe CO PD exacerbation and multifocal pneumonia.  She reports that she did fairly well at home for the last several weeks however started having increasing dyspnea over the last 3 days, associated with cough and chest congestion and increasing oxygen requirement.  She is normally on 3 L/min and has increased her home oxygen to 4 L/min due to severe shortness of breath.  She reports that she completed her prednisone taper from recent hospitalization.  She was evaluated in the emergency department today and found to be severely dyspneic with increasing oxygen requirement noted and severe diffuse wheezing.  She was given a continuous albuterol nebulizer and other medications in the ED however her symptoms did not improve enough for her to be able to discharge home.  Patient says that she has no chest pain symptoms.  She reports that she does not feel quite as bad as she did when she was here last month.  She is not at her baseline at this time.  She is talking in short sentences.  Patient was given IV steroids and antibiotics and admission was requested for further management. ? ?  ?-Assessment and Plan: ?1)Advanced COPD with acute exacerbation  ?-Continue IV Solu-Medrol, bronchodilators, mucolytics ?-Okay to stop IV vancomycin ?-Continue cefepime/azithromycin ?-Remains quite symptomatic with significant productive cough  (yellow sputum) and significant dyspnea on exertion ?-Chest x-ray findings noted, Pro-Cal not elevated ?-Clinically doubt frank pneumonia ? ?2)Acute-on-chronic respiratory failure  ?-Due to #1 above ?-PTA patient used 2 to 3 L of oxygen via nasal cannula at home ?-Required up to 4 L here ?-Continue to wean O2 as COPD improves ? ?3)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  ?-- Patient is followed closely in the outpatient clinic with Dr. Sherene Sires ?--Sadly patient continues to smoke at home limiting ability to use oxygen continuously at home ?-Outpatient follow-up with pulmonology advised ? ?4)DM2-- ?-Recent A1c 7.6 reflecting uncontrolled DM with hyperglycemia PTA ?-Anticipate worsening hyperglycemia with steroids for COPD ?-Increase Lantus insulin to 20 units nightly ?Use Novolog/Humalog Sliding scale insulin with Accu-Cheks/Fingersticks as ordered  ? ?5)Tobacco Abuse ?-- -Sadly patient is not interested in smoking cessation despite being on home O2 ?-Continue nicotine patch while here ? ?6)GERD (gastroesophageal reflux disease) ?--Continue protonix for GI prophylaxis especially while on high-dose steroid ? ?Disposition/Need for in-Hospital Stay- patient unable to be discharged at this time due to --acute on chronic hypoxic respiratory failure secondary to COPD exacerbation requiring IV steroids IV antibiotics bronchodilators and mucolytic's, ?-- patient continues to have increased work of breathing and remains symptomatic from respiratory standpoint ?-Possible discharge in 48 hours if improves ? ?Status is: Inpatient  ? ?Disposition: The patient is from: Home ?             Anticipated d/c is to: Home ?             Anticipated d/c date is: 2  days ?             Patient currently is not medically stable to d/c. ?Barriers: Not Clinically Stable-  ? ?Code Status :  -  Code Status: Full Code  ? ?Family Communication:    NA (patient is alert, awake and coherent)  ? ?DVT Prophylaxis  :   - SCDs  enoxaparin (LOVENOX)  injection 40 mg Start: 03/10/22 1300 ? ? ?Lab Results  ?Component Value Date  ? PLT 421 (H) 03/11/2022  ? ? ?Inpatient Medications ? ?Scheduled Meds: ? azithromycin  500 mg Oral Daily  ? dextromethorphan-guaiFENesin  1 tablet Oral BID  ? enoxaparin (LOVENOX) injection  40 mg Subcutaneous Q24H  ? fluticasone  2 spray Each Nare Daily  ? gabapentin  300 mg Oral TID  ? insulin aspart  0-15 Units Subcutaneous TID WC  ? insulin aspart  0-5 Units Subcutaneous QHS  ? insulin aspart  6 Units Subcutaneous TID WC  ? insulin glargine-yfgn  18 Units Subcutaneous Daily  ? ipratropium-albuterol  3 mL Nebulization Q6H  ? irbesartan  150 mg Oral Daily  ? loratadine  10 mg Oral Daily  ? methylPREDNISolone (SOLU-MEDROL) injection  40 mg Intravenous Q12H  ? nicotine  21 mg Transdermal Daily  ? oxyCODONE  5 mg Oral QID  ? pantoprazole  40 mg Oral Daily  ? polyethylene glycol  17 g Oral Daily  ? rosuvastatin  20 mg Oral QPM  ? ?Continuous Infusions: ? ceFEPime (MAXIPIME) IV 2 g (03/11/22 0909)  ? vancomycin Stopped (03/11/22 1036)  ? ?PRN Meds:.acetaminophen **OR** acetaminophen, albuterol, chlorpheniramine-HYDROcodone, methocarbamol, ondansetron **OR** ondansetron (ZOFRAN) IV, traZODone ? ? ?Anti-infectives (From admission, onward)  ? ? Start     Dose/Rate Route Frequency Ordered Stop  ? 03/11/22 1000  vancomycin (VANCOREADY) IVPB 1250 mg/250 mL       ? 1,250 mg ?166.7 mL/hr over 90 Minutes Intravenous Every 24 hours 03/10/22 1104    ? 03/11/22 1000  azithromycin (ZITHROMAX) tablet 500 mg       ? 500 mg Oral Daily 03/10/22 1309 03/15/22 0959  ? 03/10/22 1800  ceFEPIme (MAXIPIME) 2 g in sodium chloride 0.9 % 100 mL IVPB       ? 2 g ?200 mL/hr over 30 Minutes Intravenous Every 8 hours 03/10/22 1104    ? 03/10/22 0945  vancomycin (VANCOCIN) IVPB 1000 mg/200 mL premix       ? 1,000 mg ?200 mL/hr over 60 Minutes Intravenous  Once 03/10/22 0937 03/10/22 1142  ? 03/10/22 0945  ceFEPIme (MAXIPIME) 2 g in sodium chloride 0.9 % 100 mL IVPB        ? 2 g ?200 mL/hr over 30 Minutes Intravenous  Once 03/10/22 0937 03/10/22 1055  ? 03/10/22 0945  azithromycin (ZITHROMAX) 500 mg in sodium chloride 0.9 % 250 mL IVPB       ? 500 mg ?250 mL/hr over 60 Minutes Intravenous  Once 03/10/22 [id number] 03/10/22 1119  ? ?  ? ?  ? ?Subjective: ?Latitia Atteberry today has no fevers, no emesis,  No chest pain,   ?-- patient continues to have increased work of breathing and remains symptomatic from respiratory standpoint ?-Possible discharge in 48 hours if improves ?-Productive cough, dyspnea and wheezing persist ? ?Objective: ?Vitals:  ? 03/10/22 2209 03/11/22 0201 03/11/22 0519 03/11/22 0845  ?BP: 114/70  108/78   ?Pulse: (!) 103  95   ?Resp: 20  19   ?Temp: 98.2 ?F (36.8 ?C)  (!) 97 ?  F (36.1 ?C)   ?TempSrc: Oral     ?SpO2: 96% 96% 99% 96%  ?Weight:      ?Height:      ? ? ?Intake/Output Summary (Last 24 hours) at 03/11/2022 1039 ?Last data filed at 03/11/2022 9518 ?Gross per 24 hour  ?Intake 999.36 ml  ?Output --  ?Net 999.36 ml  ? ?Filed Weights  ? 03/10/22 0815  ?Weight: 58.5 kg  ? ? ?Physical Exam ? ?Gen:- Awake Alert, conversational dyspnea with coughing spells ?HEENT:- [place].AT, No sclera icterus ?Nose-- [place] 3L/min ?Neck-Supple Neck,No JVD,.  ?Lungs-diminished breath sounds, scattered wheezes bilaterally  ?CV- S1, S2 normal, regular  ?Abd-  +ve B.Sounds, Abd Soft, No tenderness,    ?Extremity/Skin:- No  edema, pedal pulses present  ?Psych-affect is somewhat anxious,, oriented x3 ?Neuro-generalized weakness, no new focal deficits, no tremors ? ?Data Reviewed: I have personally reviewed following labs and imaging studies ? ?CBC: ?Recent Labs  ?Lab 03/10/22 ?8416 03/11/22 ?0458  ?WBC 13.3* 12.9*  ?NEUTROABS 12.4* 9.7*  ?HGB 11.3* 9.9*  ?HCT 38.6 32.8*  ?MCV 87.9 87.7  ?PLT 390 421*  ? ?Basic Metabolic Panel: ?Recent Labs  ?Lab 03/10/22 ?6063 03/11/22 ?0458  ?NA 138 138  ?K 3.6 3.7  ?CL 92* 95*  ?CO2 37* 38*  ?GLUCOSE 196* 269*  ?BUN 6 10  ?CREATININE 0.36* 0.35*  ?CALCIUM 8.9 8.8*  ?MG  --   2.2  ? ?GFR: ?Estimated Creatinine Clearance: 65.1 mL/min (A) (by C-G formula based on SCr of 0.35 mg/dL (L)). ?Liver Function Tests: ?Recent Labs  ?Lab 03/10/22 ?0160 03/11/22 ?0458  ?AST 20 15  ?ALT 1

## 2022-03-12 DIAGNOSIS — J441 Chronic obstructive pulmonary disease with (acute) exacerbation: Secondary | ICD-10-CM | POA: Diagnosis not present

## 2022-03-12 LAB — GLUCOSE, CAPILLARY
Glucose-Capillary: 196 mg/dL — ABNORMAL HIGH (ref 70–99)
Glucose-Capillary: 295 mg/dL — ABNORMAL HIGH (ref 70–99)
Glucose-Capillary: 119 mg/dL — ABNORMAL HIGH (ref 70–99)
Glucose-Capillary: 450 mg/dL — ABNORMAL HIGH (ref 70–99)
Glucose-Capillary: 310 mg/dL — ABNORMAL HIGH (ref 70–99)

## 2022-03-12 MED ORDER — INSULIN ASPART 100 UNIT/ML IJ SOLN
0.0000 [IU] | Freq: Three times a day (TID) | INTRAMUSCULAR | Status: DC
  Administered 2022-03-13: 7 [IU] via SUBCUTANEOUS

## 2022-03-12 MED ORDER — ROPINIROLE HCL 0.25 MG PO TABS
0.5000 mg | ORAL_TABLET | Freq: Every day | ORAL | Status: DC
  Administered 2022-03-12: 0.5 mg via ORAL
  Filled 2022-03-12: qty 2

## 2022-03-12 MED ORDER — INSULIN GLARGINE-YFGN 100 UNIT/ML ~~LOC~~ SOLN
28.0000 [IU] | Freq: Every day | SUBCUTANEOUS | Status: DC
  Administered 2022-03-12: 28 [IU] via SUBCUTANEOUS
  Filled 2022-03-12 (×2): qty 0.28

## 2022-03-12 MED ORDER — INSULIN ASPART 100 UNIT/ML IJ SOLN
9.0000 [IU] | Freq: Three times a day (TID) | INTRAMUSCULAR | Status: DC
  Administered 2022-03-13: 9 [IU] via SUBCUTANEOUS

## 2022-03-12 MED ORDER — INSULIN ASPART 100 UNIT/ML IJ SOLN
0.0000 [IU] | Freq: Every day | INTRAMUSCULAR | Status: DC
  Administered 2022-03-12: 4 [IU] via SUBCUTANEOUS

## 2022-03-12 MED ORDER — INSULIN GLARGINE-YFGN 100 UNIT/ML ~~LOC~~ SOLN
25.0000 [IU] | Freq: Every day | SUBCUTANEOUS | Status: DC
  Administered 2022-03-12: 25 [IU] via SUBCUTANEOUS
  Filled 2022-03-12 (×2): qty 0.25

## 2022-03-12 NOTE — Progress Notes (Signed)
?PROGRESS NOTE ? ? ? ? ?[name], is a 56 y.o. female, DOB - 1966-10-09, CVE:[id number] ? ?Admit date - 03/10/2022   Admitting Physician Cleora Fleet, MD ? ?Outpatient Primary MD for the patient is [facility], [facility] ? ?LOS - 2 ? ?Chief Complaint  ?Patient presents with  ? Shortness of Breath  ?    ? ? ?Brief Narrative:  ? ?56 year old female with longstanding oxygen dependent COPD group D, followed by pulmonologist Dr. Sherene Sires, chronic pain syndrome, type 2 diabetes mellitus, GAD, ongoing tobacco use, frequent hospitalizations for COPD, was discharged from this hospital last month after a severe CO PD exacerbation and multifocal pneumonia.  She reports that she did fairly well at home for the last several weeks however started having increasing dyspnea over the last 3 days, associated with cough and chest congestion and increasing oxygen requirement.  She is normally on 3 L/min and has increased her home oxygen to 4 L/min due to severe shortness of breath.  She reports that she completed her prednisone taper from recent hospitalization.  She was evaluated in the emergency department today and found to be severely dyspneic with increasing oxygen requirement noted and severe diffuse wheezing.  She was given a continuous albuterol nebulizer and other medications in the ED however her symptoms did not improve enough for her to be able to discharge home.  Patient says that she has no chest pain symptoms.  She reports that she does not feel quite as bad as she did when she was here last month.  She is not at her baseline at this time.  She is talking in short sentences.  Patient was given IV steroids and antibiotics and admission was requested for further management. ? ?  ?-Assessment and Plan: ?1)Advanced COPD with acute exacerbation  ?-Continue IV Solu-Medrol, bronchodilators, mucolytics ?--Stopped IV vancomycin ?-Continue cefepime/azithromycin ?-Productive cough, dyspnea and wheezing improving ?-Chest x-ray  findings noted, Pro-Cal not elevated ?-Clinically doubt frank pneumonia ? ?2)Acute-on-chronic respiratory failure  ?-Due to #1 above ?-PTA patient used 2 to 3 L of oxygen via nasal cannula at home ?-Required up to 4 L here ?-Continue to wean O2 as COPD improves ? ?3)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  ?-- Patient is followed closely in the outpatient clinic with Dr. Sherene Sires ?--Sadly patient continues to smoke at home limiting ability to use oxygen continuously at home ?-Outpatient follow-up with pulmonology advised ? ?4)DM2-- ?-Recent A1c 7.6 reflecting uncontrolled DM with hyperglycemia PTA ?--Patient with severe/persistent steroid-induced hyperglycemia ?-Increase Lantus insulin further to 28 units nightly ?Use Novolog/Humalog Sliding scale insulin with Accu-Cheks/Fingersticks as ordered  ? ?5)Tobacco Abuse ?-- -Sadly patient is not interested in smoking cessation despite being on home O2 ?-Continue nicotine patch while here ? ?6)GERD (gastroesophageal reflux disease) ?--Continue protonix for GI prophylaxis especially while on high-dose steroid ? ?Disposition/Need for in-Hospital Stay- patient unable to be discharged at this time due to --acute on chronic hypoxic respiratory failure secondary to COPD exacerbation requiring IV steroids IV antibiotics bronchodilators and mucolytic's, ?-Possible discharge home in 24 hours if continues to improve ? ?Status is: Inpatient  ? ?Disposition: The patient is from: Home ?             Anticipated d/c is to: Home ?             Anticipated d/c date is: 1 day ?             Patient currently is not medically stable to d/c. ?Barriers:  Not Clinically Stable-  ? ?Code Status :  -  Code Status: Full Code  ? ?Family Communication:    NA (patient is alert, awake and coherent)  ? ?DVT Prophylaxis  :   - SCDs  enoxaparin (LOVENOX) injection 40 mg Start: 03/10/22 1300 ? ? ?Lab Results  ?Component Value Date  ? PLT 421 (H) 03/11/2022  ? ?Inpatient Medications ? ?Scheduled Meds: ?  azithromycin  500 mg Oral Daily  ? dextromethorphan-guaiFENesin  1 tablet Oral BID  ? enoxaparin (LOVENOX) injection  40 mg Subcutaneous Q24H  ? fluticasone  2 spray Each Nare Daily  ? gabapentin  300 mg Oral TID  ? [START ON 03/13/2022] insulin aspart  0-20 Units Subcutaneous TID WC  ? insulin aspart  0-5 Units Subcutaneous QHS  ? [START ON 03/13/2022] insulin aspart  9 Units Subcutaneous TID WC  ? insulin glargine-yfgn  28 Units Subcutaneous QHS  ? ipratropium-albuterol  3 mL Nebulization Q6H  ? irbesartan  150 mg Oral Daily  ? loratadine  10 mg Oral Daily  ? methylPREDNISolone (SOLU-MEDROL) injection  40 mg Intravenous Q12H  ? nicotine  21 mg Transdermal Daily  ? oxyCODONE  5 mg Oral QID  ? pantoprazole  40 mg Oral Daily  ? polyethylene glycol  17 g Oral Daily  ? rOPINIRole  0.5 mg Oral QHS  ? rosuvastatin  20 mg Oral QPM  ? ?Continuous Infusions: ? ceFEPime (MAXIPIME) IV 2 g (03/12/22 1724)  ? ?PRN Meds:.acetaminophen **OR** acetaminophen, albuterol, chlorpheniramine-HYDROcodone, methocarbamol, ondansetron **OR** ondansetron (ZOFRAN) IV, traZODone ? ? ?Anti-infectives (From admission, onward)  ? ? Start     Dose/Rate Route Frequency Ordered Stop  ? 03/11/22 1000  vancomycin (VANCOREADY) IVPB 1250 mg/250 mL  Status:  Discontinued       ? 1,250 mg ?166.7 mL/hr over 90 Minutes Intravenous Every 24 hours 03/10/22 1104 03/12/22 0853  ? 03/11/22 1000  azithromycin (ZITHROMAX) tablet 500 mg       ? 500 mg Oral Daily 03/10/22 1309 03/15/22 0959  ? 03/10/22 1800  ceFEPIme (MAXIPIME) 2 g in sodium chloride 0.9 % 100 mL IVPB       ? 2 g ?200 mL/hr over 30 Minutes Intravenous Every 8 hours 03/10/22 1104    ? 03/10/22 0945  vancomycin (VANCOCIN) IVPB 1000 mg/200 mL premix       ? 1,000 mg ?200 mL/hr over 60 Minutes Intravenous  Once 03/10/22 0937 03/10/22 1142  ? 03/10/22 0945  ceFEPIme (MAXIPIME) 2 g in sodium chloride 0.9 % 100 mL IVPB       ? 2 g ?200 mL/hr over 30 Minutes Intravenous  Once 03/10/22 0937 03/10/22 1055  ?  03/10/22 0945  azithromycin (ZITHROMAX) 500 mg in sodium chloride 0.9 % 250 mL IVPB       ? 500 mg ?250 mL/hr over 60 Minutes Intravenous  Once 03/10/22 [id number] 03/10/22 1119  ? ?  ? ?  ? ?Subjective: ?Sulay Canova today has no fevers, no emesis,  No chest pain,   ?-Productive cough and dyspnea improving slowly ?-Persistent high sugars noted--some polyuria ? ?Objective: ?Vitals:  ? 03/12/22 0401 03/12/22 0806 03/12/22 1331 03/12/22 1415  ?BP: 136/66  (!) 146/86   ?Pulse: 100  90   ?Resp: 17  19   ?Temp: 98.3 ?F (36.8 ?C)  98.7 ?F (37.1 ?C)   ?TempSrc: Oral  Oral   ?SpO2: 100% 95% 96% 94%  ?Weight:      ?Height:      ? ? ?  Intake/Output Summary (Last 24 hours) at 03/12/2022 1817 ?Last data filed at 03/12/2022 1300 ?Gross per 24 hour  ?Intake 960 ml  ?Output --  ?Net 960 ml  ? ?Filed Weights  ? 03/10/22 0815  ?Weight: 58.5 kg  ? ? ?Physical Exam ? ?Gen:- Awake Alert, no conversational dyspnea, has dyspnea with exertion however ?HEENT:- [place].AT, No sclera icterus ?Nose-- [place] 3L/min ?Neck-Supple Neck,No JVD,.  ?Lungs-slightly improving air movement, few wheezes  ?CV- S1, S2 normal, regular  ?Abd-  +ve B.Sounds, Abd Soft, No tenderness,    ?Extremity/Skin:- No  edema, pedal pulses present  ?Psych-affect is somewhat anxious,, oriented x3 ?Neuro-generalized weakness, no new focal deficits, no tremors ? ?Data Reviewed: I have personally reviewed following labs and imaging studies ? ?CBC: ?Recent Labs  ?Lab 03/10/22 ?5956 03/11/22 ?0458  ?WBC 13.3* 12.9*  ?NEUTROABS 12.4* 9.7*  ?HGB 11.3* 9.9*  ?HCT 38.6 32.8*  ?MCV 87.9 87.7  ?PLT 390 421*  ? ?Basic Metabolic Panel: ?Recent Labs  ?Lab 03/10/22 ?3875 03/11/22 ?0458  ?NA 138 138  ?K 3.6 3.7  ?CL 92* 95*  ?CO2 37* 38*  ?GLUCOSE 196* 269*  ?BUN 6 10  ?CREATININE 0.36* 0.35*  ?CALCIUM 8.9 8.8*  ?MG  --  2.2  ? ?GFR: ?Estimated Creatinine Clearance: 65.1 mL/min (A) (by C-G formula based on SCr of 0.35 mg/dL (L)). ?Liver Function Tests: ?Recent Labs  ?Lab 03/10/22 ?6433 03/11/22 ?0458  ?AST  20 15  ?ALT 14 11  ?ALKPHOS 94 74  ?BILITOT 0.3 0.2*  ?PROT 7.5 6.5  ?ALBUMIN 3.2* 2.7*  ? ?Cardiac Enzymes: ?No results for input(s): CKTOTAL, CKMB, CKMBINDEX, TROPONINI in the last 168 hours. ?BNP (las

## 2022-03-12 NOTE — Progress Notes (Signed)
Patients BS 450mg /dl. Notified Dr. , gave 21 units Ssi , no additional coverage ordered at this time. Semglee was increased to 28 units ?

## 2022-03-12 NOTE — Progress Notes (Signed)
Inpatient Diabetes Program Recommendations ? ?AACE/ADA: New Consensus Statement on Inpatient Glycemic Control ? ?Target Ranges:  Prepandial:   less than 140 mg/dL ?     Peak postprandial:   less than 180 mg/dL (1-2 hours) ?     Critically ill patients:  140 - 180 mg/dL  ? ? Latest Reference Range & Units 03/12/22 02:36  ?Glucose-Capillary 70 - 99 mg/dL 449 (H)  ? ? Latest Reference Range & Units 03/11/22 07:24 03/11/22 10:28 03/11/22 11:36 03/11/22 16:10 03/11/22 21:08  ?Glucose-Capillary 70 - 99 mg/dL 675 (H) 916 (H) 384 (H) 321 (H) 431 (H)  ? ?Review of Glycemic Control ? ?Diabetes history: DM2 ?Outpatient Diabetes medications: Lantus 15 units daily, Metformin 1000 mg BID ?Current orders for Inpatient glycemic control: Semglee 20 units QHS, Novolog 0-15  units TID with meals, Novolog 0-5 units QHS, Novolog 6 units TID with meals; Solumedrol 40 mg Q12H ?  ?  ?Inpatient Diabetes Program Recommendations:   ?  ?Insulin: Noted patient received Semglee 18 units at 8:56 on 4/19 and Semglee 20 units at 22:01 on 03/11/22 (received a total of 38 units of Semglee on 03/11/22).  ?  ?Thanks, ?Orlando Penner, RN, MSN, CDE ?Diabetes Coordinator ?Inpatient Diabetes Program ?[phone] (Team Pager from 8am to 5pm)' ?

## 2022-03-13 DIAGNOSIS — J9600 Acute respiratory failure, unspecified whether with hypoxia or hypercapnia: Secondary | ICD-10-CM

## 2022-03-13 LAB — BASIC METABOLIC PANEL
Anion gap: 10 (ref 5–15)
BUN: 10 mg/dL (ref 6–20)
CO2: 30 mmol/L (ref 22–32)
Calcium: 8.7 mg/dL — ABNORMAL LOW (ref 8.9–10.3)
Chloride: 95 mmol/L — ABNORMAL LOW (ref 98–111)
Creatinine, Ser: 0.48 mg/dL (ref 0.44–1.00)
GFR, Estimated: >60 mL/min (ref >60)
Glucose, Bld: 370 mg/dL — ABNORMAL HIGH (ref 70–99)
Potassium: 4 mmol/L (ref 3.5–5.1)
Sodium: 135 mmol/L (ref 135–145)

## 2022-03-13 LAB — CBC
HCT: 36 % (ref 36.0–46.0)
Hemoglobin: 11 g/dL — ABNORMAL LOW (ref 12.0–15.0)
MCH: 26.5 pg (ref 26.0–34.0)
MCHC: 30.6 g/dL (ref 30.0–36.0)
MCV: 86.7 fL (ref 80.0–100.0)
Platelets: 463 10*3/uL — ABNORMAL HIGH (ref 150–400)
RBC: 4.15 MIL/uL (ref 3.87–5.11)
RDW: 13.3 % (ref 11.5–15.5)
WBC: 10.6 10*3/uL — ABNORMAL HIGH (ref 4.0–10.5)
nRBC: 0 % (ref 0.0–0.2)

## 2022-03-13 LAB — GLUCOSE, CAPILLARY
Glucose-Capillary: 271 mg/dL — ABNORMAL HIGH (ref 70–99)
Glucose-Capillary: 243 mg/dL — ABNORMAL HIGH (ref 70–99)
Glucose-Capillary: 119 mg/dL — ABNORMAL HIGH (ref 70–99)

## 2022-03-13 MED ORDER — ROPINIROLE HCL 0.5 MG PO TABS: 0.5000 mg | ORAL_TABLET | Freq: Every day | ORAL | 1 refills | Status: DC

## 2022-03-13 MED ORDER — POLYETHYLENE GLYCOL 3350 17 G PO PACK
17.0000 g | PACK | Freq: Every day | ORAL | 1 refills | Status: DC
Start: 2022-03-13 — End: 2024-01-03

## 2022-03-13 MED ORDER — ALBUTEROL SULFATE HFA 108 (90 BASE) MCG/ACT IN AERS
2.0000 | INHALATION_SPRAY | RESPIRATORY_TRACT | 1 refills | Status: AC | PRN
Start: 2022-03-13 — End: ?

## 2022-03-13 MED ORDER — METFORMIN HCL 1000 MG PO TABS: 1000.0000 mg | ORAL_TABLET | Freq: Two times a day (BID) | ORAL | 3 refills | Status: DC

## 2022-03-13 MED ORDER — PANTOPRAZOLE SODIUM 40 MG PO TBEC: 40.0000 mg | DELAYED_RELEASE_TABLET | Freq: Every day | ORAL | 3 refills | Status: DC

## 2022-03-13 MED ORDER — PREDNISONE 20 MG PO TABS: 40.0000 mg | ORAL_TABLET | Freq: Every day | ORAL | 0 refills | Status: AC

## 2022-03-13 MED ORDER — AZITHROMYCIN 500 MG PO TABS: 500.0000 mg | ORAL_TABLET | Freq: Every day | ORAL | 0 refills | Status: AC

## 2022-03-13 MED ORDER — INSULIN GLARGINE 100 UNIT/ML SOLOSTAR PEN: 18.0000 [IU] | PEN_INJECTOR | Freq: Every day | SUBCUTANEOUS | 2 refills | Status: DC

## 2022-03-13 MED ORDER — INSULIN GLARGINE-YFGN 100 UNIT/ML ~~LOC~~ SOLN
20.0000 [IU] | Freq: Once | SUBCUTANEOUS | Status: AC
  Administered 2022-03-13: 20 [IU] via SUBCUTANEOUS
  Filled 2022-03-13: qty 0.2

## 2022-03-13 MED ORDER — ALBUTEROL SULFATE (2.5 MG/3ML) 0.083% IN NEBU: 2.5000 mg | INHALATION_SOLUTION | Freq: Four times a day (QID) | RESPIRATORY_TRACT | 3 refills | Status: AC | PRN

## 2022-03-13 NOTE — Discharge Summary (Signed)
?                                                                                ? ? ?[name], is a 56 y.o. female  DOB 1966-06-12  MRN [id number]. ? ?Admission date:  03/10/2022  Admitting Physician  Cleora Fleet, MD ? ?Discharge Date:  03/13/2022  ? ?Primary MD  Pllc, [facility] ? ?Recommendations for primary care physician for things to follow:  ? ?1)You need oxygen at home at 2 L via nasal cannula continuously while awake and while asleep--- smoking or having open fires around oxygen can cause fire, significant injury and death ? ?2)Complete abstinence from tobacco strongly encouraged--- May use over-the-counter nicotine patch to help you quit ? ?3) your blood sugars/glucose were running higher over the next week or so due to steroids/prednisone--- please check your sugars at least 4 times a day, your insulin dosage may have to be adjusted further if his sugars continue to be high ? ?4) please follow-up with the primary care physician in about a week or so for recheck and reevaluation ? ?5)Avoid ibuprofen/Advil/Aleve/Motrin/Goody Powders/Naproxen/BC powders/Meloxicam/Diclofenac/Indomethacin and other Nonsteroidal anti-inflammatory medications as these will make you more likely to bleed and can cause stomach ulcers, can also cause Kidney problems. ? ?6)Avoid Sweets--- diabetic diet advised ? ?Admission Diagnosis  COPD with acute exacerbation (HCC) [J44.1] ?Acute on chronic respiratory failure with hypoxia (HCC) [J96.21] ?Community acquired pneumonia, unspecified laterality [J18.9] ? ? ?Discharge Diagnosis  COPD with acute exacerbation (HCC) [J44.1] ?Acute on chronic respiratory failure with hypoxia (HCC) [J96.21] ?Community acquired pneumonia, unspecified laterality [J18.9]   ? ?Principal Problem: ?  COPD with acute exacerbation (HCC) ?Active Problems: ?  Acute-on-chronic respiratory failure (HCC) ?  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  ?  DM (diabetes mellitus) (HCC) ?  Cigarette  smoker ?  GERD (gastroesophageal reflux disease) ?  Atypical pneumonia - SEPSIS RULED OUT ?    ? ?Past Medical History:  ?Diagnosis Date  ? Angina   ? Anxiety state 10/15/2015  ? ARDS (adult respiratory distress syndrome) (HCC)   ? Jan 2011  ? Asthma   ? Chronic back pain   ? COPD (chronic obstructive pulmonary disease) (HCC)   ? Diabetes mellitus   ? GERD (gastroesophageal reflux disease) 12/21/2012  ? HTN (hypertension) 10/15/2015  ? Hyperglycemia, drug-induced   ? steroid induced hyperglycemia  ? Migraine headache   ? On home O2   ? Pneumonia   ? Recurrent upper respiratory infection (URI)   ? Shortness of breath   ? ? ?Past Surgical History:  ?Procedure Laterality Date  ? c-section    ? TRACHEOSTOMY    ? decannulated 12/2009  ? TUBAL LIGATION    ? uterine ablasion    ? ? ? HPI  from the history and physical done on the day of admission:  ? ?  ?  ?Chief Complaint: Shortness of breath  ?  ?HPI: [name] is a 56 year old female with longstanding oxygen dependent COPD, followed by pulmonologist Dr. Sherene Sires, chronic pain syndrome, type 2 diabetes mellitus, GAD, ongoing tobacco use, frequent hospitalizations for COPD, was discharged from this hospital last month  after a severe CO PD exacerbation and multifocal pneumonia.  She reports that she did fairly well at home for the last several weeks however started having increasing dyspnea over the last 3 days, associated with cough and chest congestion and increasing oxygen requirement.  She is normally on 3 L/min and has increased her home oxygen to 4 L/min due to severe shortness of breath.  She reports that she completed her prednisone taper from recent hospitalization.  She was evaluated in the emergency department today and found to be severely dyspneic with increasing oxygen requirement noted and severe diffuse wheezing.  She was given a continuous albuterol nebulizer and other medications in the ED however her symptoms did not improve enough for her to be able to  discharge home.  Patient says that she has no chest pain symptoms.  She reports that she does not feel quite as bad as she did when she was here last month.  She is not at her baseline at this time.  She is talking in short sentences.  Patient was given IV steroids and antibiotics and admission was requested for further management. ? ? ? ? Hospital Course:  ? ?  ?56 year old female with longstanding oxygen dependent COPD group D, followed by pulmonologist Dr. Sherene Sires, chronic pain syndrome, type 2 diabetes mellitus, GAD, ongoing tobacco use, frequent hospitalizations for COPD, was discharged from this hospital last month after a severe CO PD exacerbation and multifocal pneumonia.  She reports that she did fairly well at home for the last several weeks however started having increasing dyspnea over the last 3 days, associated with cough and chest congestion and increasing oxygen requirement.  She is normally on 3 L/min and has increased her home oxygen to 4 L/min due to severe shortness of breath.  She reports that she completed her prednisone taper from recent hospitalization.  She was evaluated in the emergency department today and found to be severely dyspneic with increasing oxygen requirement noted and severe diffuse wheezing.  She was given a continuous albuterol nebulizer and other medications in the ED however her symptoms did not improve enough for her to be able to discharge home.  Patient says that she has no chest pain symptoms.  She reports that she does not feel quite as bad as she did when she was here last month.  She is not at her baseline at this time.  She is talking in short sentences.  Patient was given IV steroids and antibiotics and admission was requested for further management. ? ?Assessment and Plan: ?1)Advanced COPD with acute exacerbation  ?-Treated with also received IV Solu-Medrol, bronchodilators, mucolytics ?-- Also received IV vancomycin ?-Treated with cefepime/azithromycin ?-Respiratory  symptoms improved significantly ?-Chest x-ray was without new additional findings compared to prior ?-Overall much improved with above measures- ?-Okay to discharge home on azithromycin, prednisone and bronchodilators ?  ?2)Acute-on-chronic respiratory failure  ?-Due to #1 above ?-PTA patient used 2 to 3 L of oxygen via nasal cannula at home ?-Required up to 4 L here ?--Oxygen requirement is back to baseline ?-Management as above #1 ?  ?3)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  ?-- Patient is followed closely in the outpatient clinic with Dr. Sherene Sires ?--Sadly patient continues to smoke at home limiting ability to use oxygen continuously at home ?-Outpatient follow-up with pulmonology advised ?-Smoking cessation again reemphasized ?  ?4)DM2-- ?-Recent A1c 7.6 reflecting uncontrolled DM with hyperglycemia PTA ?-Eczema control should improve with steroid taper and discontinuation  ?--Continue metformin, insulin  regimen adjusted ?  ?5)Tobacco Abuse ?-- -Sadly patient is not interested in smoking cessation despite being on home O2 ?-Patient declines nicotine patch ?  ?6)GERD (gastroesophageal reflux disease) ?-- PPI advised ? ?Disposition/--- Home ?  ?Disposition: The patient is from: Home ?             Anticipated d/c is to: Home ?   ?Code Status :  -  Code Status: Full Code  ?  ?Discharge Condition: Stable ?  ?Diet and Activity recommendation:  As advised ? ?Discharge Instructions   ?Discharge Instructions   ? ? Call MD for:  difficulty breathing, headache or visual disturbances   Complete by: As directed ?  ? Call MD for:  persistant dizziness or light-headedness   Complete by: As directed ?  ? Call MD for:  persistant nausea and vomiting   Complete by: As directed ?  ? Call MD for:  temperature >100.4   Complete by: As directed ?  ? Diet - low sodium heart healthy   Complete by: As directed ?  ? Diet - low sodium heart healthy   Complete by: As directed ?  ? Diet Carb Modified   Complete by: As directed ?  ?  Discharge instructions   Complete by: As directed ?  ? 1)you need oxygen at home at 2 L via nasal cannula continuously while awake and while asleep--- smoking or having open fires around oxygen can cause fire

## 2022-03-13 NOTE — Progress Notes (Signed)
Discharge instructions reviewed with patient. Patient verbalized understanding of instructions, patient discharged home with family in stable condition.   

## 2022-03-13 NOTE — Discharge Instructions (Signed)
1)You need oxygen at home at 2 L via nasal cannula continuously while awake and while asleep--- smoking or having open fires around oxygen can cause fire, significant injury and death ? ?2)Complete abstinence from tobacco strongly encouraged--- May use over-the-counter nicotine patch to help you quit ? ?3) your blood sugars/glucose were running higher over the next week or so due to steroids/prednisone--- please check your sugars at least 4 times a day, your insulin dosage may have to be adjusted further if his sugars continue to be high ? ?4) please follow-up with the primary care physician in about a week or so for recheck and reevaluation ? ?5)Avoid ibuprofen/Advil/Aleve/Motrin/Goody Powders/Naproxen/BC powders/Meloxicam/Diclofenac/Indomethacin and other Nonsteroidal anti-inflammatory medications as these will make you more likely to bleed and can cause stomach ulcers, can also cause Kidney problems. ? ?6)Avoid Sweets--- diabetic diet advised ?

## 2022-03-13 NOTE — TOC Transition Note (Signed)
Transition of Care (TOC) - CM/SW Discharge Note ? ? ?Patient Details  ?Name: Treina A Colantuono ?MRN: [id number] ?Date of Birth: 1965/12/09 ? ?Transition of Care (TOC) CM/SW Contact:  ?Elliot Gault, LCSW ?Phone Number: ?03/13/2022, 11:44 AM ? ? ?Clinical Narrative:    ? ?Pt stable for dc today per MD. There are no TOC needs for dc. ? ?Final next level of care: Home/Self Care ?Barriers to Discharge: Barriers Resolved ? ? ?Patient Goals and CMS Choice ?Patient states their goals for this hospitalization and ongoing recovery are:: return home ?CMS Medicare.gov Compare Post Acute Care list provided to:: Patient ?Choice offered to / list presented to : Patient ? ?Discharge Placement ?  ?           ?  ?  ?  ?  ? ?Discharge Plan and Services ?In-house Referral: Clinical Social Work ?Discharge Planning Services: CM Consult ?           ?  ?  ?  ?  ?  ?  ?  ?  ?  ?  ? ?Social Determinants of Health (SDOH) Interventions ?  ? ? ?Readmission Risk Interventions ? ?  03/11/2022  ?  9:30 AM  ?Readmission Risk Prevention Plan  ?Transportation Screening Complete  ?HRI or Home Care Consult Complete  ?Social Work Consult for Recovery Care Planning/Counseling Complete  ?Palliative Care Screening Not Applicable  ?Medication Review Oceanographer) Complete  ? ? ? ? ? ?

## 2022-03-15 LAB — CULTURE, BLOOD (ROUTINE X 2)
Culture: NO GROWTH
Culture: NO GROWTH
Special Requests: ADEQUATE
Special Requests: ADEQUATE

## 2022-03-30 ENCOUNTER — Emergency Department (HOSPITAL_COMMUNITY): Payer: Medicaid Other

## 2022-03-30 ENCOUNTER — Encounter (HOSPITAL_COMMUNITY): Payer: Self-pay | Admitting: *Deleted

## 2022-03-30 ENCOUNTER — Emergency Department (HOSPITAL_COMMUNITY)
Admission: EM | Admit: 2022-03-30 | Discharge: 2022-03-30 | Disposition: A | Discharge status: Home / Self Care | Payer: Medicaid Other | Attending: Student | Admitting: Student

## 2022-03-30 ENCOUNTER — Other Ambulatory Visit: Payer: Self-pay

## 2022-03-30 DIAGNOSIS — Z794 Long term (current) use of insulin: Secondary | ICD-10-CM | POA: Diagnosis not present

## 2022-03-30 DIAGNOSIS — R519 Headache, unspecified: Secondary | ICD-10-CM | POA: Insufficient documentation

## 2022-03-30 DIAGNOSIS — W19XXXA Unspecified fall, initial encounter: Secondary | ICD-10-CM

## 2022-03-30 DIAGNOSIS — Z7984 Long term (current) use of oral hypoglycemic drugs: Secondary | ICD-10-CM | POA: Diagnosis not present

## 2022-03-30 DIAGNOSIS — J449 Chronic obstructive pulmonary disease, unspecified: Secondary | ICD-10-CM | POA: Diagnosis not present

## 2022-03-30 DIAGNOSIS — E119 Type 2 diabetes mellitus without complications: Secondary | ICD-10-CM | POA: Diagnosis not present

## 2022-03-30 DIAGNOSIS — W133XXA Fall through floor, initial encounter: Secondary | ICD-10-CM | POA: Diagnosis not present

## 2022-03-30 DIAGNOSIS — M545 Low back pain, unspecified: Secondary | ICD-10-CM | POA: Insufficient documentation

## 2022-03-30 DIAGNOSIS — Z7951 Long term (current) use of inhaled steroids: Secondary | ICD-10-CM | POA: Diagnosis not present

## 2022-03-30 DIAGNOSIS — M79605 Pain in left leg: Secondary | ICD-10-CM | POA: Insufficient documentation

## 2022-03-30 MED ORDER — OXYCODONE HCL 5 MG PO TABS: 5.0000 mg | ORAL_TABLET | ORAL | 0 refills | Status: AC | PRN

## 2022-03-30 MED ORDER — OXYCODONE-ACETAMINOPHEN 5-325 MG PO TABS
1.0000 | ORAL_TABLET | Freq: Once | ORAL | Status: AC
  Administered 2022-03-30: 1 via ORAL
  Filled 2022-03-30: qty 1

## 2022-03-30 NOTE — Discharge Instructions (Addendum)
Follow-up with your primary for refills of the narcotic pain medicine.  Take Tylenol for the pain at home, return if things change or worsen.  You can take the oxycodone for severe pain, try to limit the amount you take. ?

## 2022-03-30 NOTE — ED Provider Notes (Signed)
Patient presented today due to fall.  She has a history of COPD, on 3 L nasal cannula at baseline.  She is not hypoxic with a 3 L, not tachypneic.  Physical exam is also reassuring without any focal deficits.  Imaging obtained due to unwitnessed fall and not yet resulting at time of shift change.  Please see previous providers note for complete history or look below. ? ?  ?With medical history including chronic respiratory failure on 3 L via nasal cannula, COPD, chronic pain syndrome, type 2 diabetes, GERD tobacco use present with chief complaint of a fall.  Patient states over last 2 nights she has slept walked and fell.  Patient states that she fell on the floor 2 nights ago her son was able to wake her up and she was able to walk back to bed.  She states that last night she was walking and fell and hit her nightstand.  She states that she woke up and was able to get back to bed.  She states that she has a headache describes a severe's pressure-like sensation, but denies change in vision paresthesias or weakness upper or lower extremities, she states she is having lower back pain but denies saddle paresthesias urinary or bowel incontinence, she denies any stomach pains chest pain shortness of breath.  She is having no other complaints this time. ? ?Physical Exam  ?BP 134/84   Pulse 94   Temp 98.6 ?F (37 ?C) (Oral)   Resp 20   Ht 5' (1.524 m)   Wt 59.9 kg   SpO2 98%   BMI 25.78 kg/m?  ? ?Physical Exam ?Vitals and nursing note reviewed. Exam conducted with a chaperone present.  ?Constitutional:   ?   General: She is not in acute distress. ?   Appearance: Normal appearance.  ?HENT:  ?   Head: Normocephalic and atraumatic.  ?Eyes:  ?   General: No scleral icterus. ?   Extraocular Movements: Extraocular movements intact.  ?   Pupils: Pupils are equal, round, and reactive to light.  ?Cardiovascular:  ?   Rate and Rhythm: Normal rate and regular rhythm.  ?Pulmonary:  ?   Effort: Pulmonary effort is normal.  ?    Breath sounds: Normal breath sounds.  ?   Comments: On 3 L nasal cannula ?Skin: ?   Coloration: Skin is not jaundiced.  ?Neurological:  ?   Mental Status: She is alert. Mental status is at baseline.  ?   Coordination: Coordination normal.  ?   Comments: No focal deficits, patient is able to raise both lower extremities against gravity.  Able to raise upper extremities against gravity.  She is ambulatory.  ? ? ?Procedures  ?Procedures ? ?ED Course / MDM  ?  ?Medical Decision Making ?Amount and/or Complexity of Data Reviewed ?Radiology: ordered. ? ?Risk ?Prescription drug management. ? ? ?Patient given 2 Percocets.  I reviewed patient's imaging, agree with radiologist interpretation of no acute findings.  Patient is able to ambulate, pain is improved after the 2 Percocets on reevaluation.  No focal deficits, I do not feel any additional work-up is warranted here.  Patient requesting home dose of narcotics, reviewed narcotic database as below. ? ? 01/28/2022  01/28/2022   2  Oxycodone Hcl (Ir) 5 Mg Cap 20.00  10  [place]  [id number]   [place] ([id number])  0/0  15.00 MME  Private Pay  [place]   ? 01/26/2022  01/26/2022   2  Hydrocodone-Chlorphen Er Susp 45.00  [place]  [id number]   [place] ([id number])  0/0  22.50 MME  Medicaid  [place]   ? 11/20/2021  11/20/2021   2  Oxycodone Hcl (Ir) 5 Mg Tablet 120.00  30  [place]  [id number]   [place] ([id number])  0/0  30.00 MME  Private Pay  [place]   ? ? ?Patient has been without narcotics for the last 2 months, will give short course of Oxy and have her follow-up with her PCP. ? ? ?  ?Sherrill Raring, PA-C ?03/30/22 2202 ? ?  ?Teressa Lower, MD ?03/31/22 0107 ? ?

## 2022-03-30 NOTE — ED Notes (Signed)
Pt returned from CT °

## 2022-03-30 NOTE — ED Notes (Signed)
Bedside toilet placed in room 

## 2022-03-30 NOTE — ED Provider Notes (Signed)
?[facility]PARTMENT ?Provider Note ? ? ?CSN: [id number] ?Arrival date & time: 03/30/22  1745 ? ?  ? ?History ? ?Chief Complaint  ?Patient presents with  ? Fall  ? ? ?[name] is a 56 y.o. female. ? ?HPI ? ?With medical history including chronic respiratory failure on 3 L via nasal cannula, COPD, chronic pain syndrome, type 2 diabetes, GERD tobacco use present with chief complaint of a fall.  Patient states over last 2 nights she has slept walked and fell.  Patient states that she fell on the floor 2 nights ago her son was able to wake her up and she was able to walk back to bed.  She states that last night she was walking and fell and hit her nightstand.  She states that she woke up and was able to get back to bed.  She states that she has a headache describes a severe's pressure-like sensation, but denies change in vision paresthesias or weakness upper or lower extremities, she states she is having lower back pain but denies saddle paresthesias urinary or bowel incontinence, she denies any stomach pains chest pain shortness of breath.  She is having no other complaints this time. ? ? ? ?Home Medications ?Prior to Admission medications   ?Medication Sig Start Date End Date Taking? Authorizing Provider  ?acetaminophen (TYLENOL) 500 MG tablet Take 500 mg by mouth every 6 (six) hours as needed for fever or mild pain.    [provider]  ?albuterol (PROVENTIL) (2.5 MG/3ML) 0.083% nebulizer solution Take 3 mLs (2.5 mg total) by nebulization every 6 (six) hours as needed for wheezing or shortness of breath. 03/13/22   Shon HaleEmokpae, Courage, MD  ?albuterol (VENTOLIN HFA) 108 (90 Base) MCG/ACT inhaler Inhale 2 puffs into the lungs every 4 (four) hours as needed for wheezing or shortness of breath. 03/13/22   Emokpae, Courage, MD  ?fluticasone (FLONASE) 50 MCG/ACT nasal spray Place 2 sprays into both nostrils daily. 04/13/18   [provider]  ?gabapentin (NEURONTIN) 300 MG capsule Take 300 mg by  mouth 3 (three) times daily. 07/31/21   [provider]  ?glucose blood test strip 1 each by Other route 3 (three) times daily. Use as instructed, accucheck test strips 01/04/20   Corum, Minerva FesterLisa L, MD  ?insulin glargine (LANTUS) 100 UNIT/ML Solostar Pen Inject 18 Units into the skin daily. 03/13/22   Shon HaleEmokpae, Courage, MD  ?Insulin Pen Needle 31G X 5 MM MISC Use as directed 03/12/20   Corum, Minerva FesterLisa L, MD  ?ipratropium-albuterol (DUONEB) 0.5-2.5 (3) MG/3ML SOLN Take 3 mLs by nebulization every 6 (six) hours as needed (shortness of breath). 10/31/21   [provider]  ?irbesartan (AVAPRO) 150 MG tablet TAKE 1 TABLET BY MOUTH EVERY DAY ?Patient taking differently: Take 150 mg by mouth daily. 05/29/21   Nyoka CowdenWert, Michael B, MD  ?loratadine (CLARITIN) 10 MG tablet Take 10 mg by mouth daily. 08/13/21   [provider]  ?metFORMIN (GLUCOPHAGE) 1000 MG tablet Take 1 tablet (1,000 mg total) by mouth 2 (two) times daily with a meal. 03/13/22   Emokpae, Courage, MD  ?methocarbamol (ROBAXIN) 500 MG tablet Take 500 mg by mouth every 8 (eight) hours as needed for muscle spasms. 08/13/21   [provider]  ?ondansetron (ZOFRAN-ODT) 4 MG disintegrating tablet Take by mouth. 01/21/22   [provider]  ?oxyCODONE (OXY IR/ROXICODONE) 5 MG immediate release tablet Take 5 mg by mouth 4 (four) times daily. 11/20/21   [provider]  ?  pantoprazole (PROTONIX) 40 MG tablet Take 1 tablet (40 mg total) by mouth daily. 03/13/22   Shon Hale, MD  ?polyethylene glycol (MIRALAX / GLYCOLAX) 17 g packet Take 17 g by mouth daily. 03/13/22   Shon Hale, MD  ?rOPINIRole (REQUIP) 0.5 MG tablet Take 1 tablet (0.5 mg total) by mouth at bedtime. 03/13/22   Shon Hale, MD  ?rosuvastatin (CRESTOR) 20 MG tablet Take 20 mg by mouth daily. 06/02/21   [provider]  ?SUMAtriptan (IMITREX) 50 MG tablet Take 50 mg by mouth as needed for migraine or headache. 11/29/21   [provider]  ?ARIPiprazole  (ABILIFY) 5 MG tablet Take 5 mg by mouth daily.    02/03/12  [provider]  ?venlafaxine (EFFEXOR) 75 MG tablet Take 75 mg by mouth daily.    02/03/12  [provider]  ?   ? ?Allergies    ?Levofloxacin, Levofloxacin in d5w, Penicillins, Doxycycline, and Morphine   ? ?Review of Systems   ?Review of Systems  ?Constitutional:  Negative for chills and fever.  ?Respiratory:  Negative for shortness of breath.   ?Cardiovascular:  Negative for chest pain.  ?Gastrointestinal:  Negative for abdominal pain.  ?Musculoskeletal:  Positive for back pain.  ?Neurological:  Positive for headaches.  ? ?Physical Exam ?Updated Vital Signs ?BP (!) 146/90   Pulse (!) 102   Temp 98.6 ?F (37 ?C) (Oral)   Resp 20   Ht 5' (1.524 m)   Wt 59.9 kg   SpO2 98%   BMI 25.78 kg/m?  ?Physical Exam ?Vitals and nursing note reviewed.  ?Constitutional:   ?   General: She is not in acute distress. ?   Appearance: She is not ill-appearing.  ?HENT:  ?   Head: Normocephalic and atraumatic.  ?   Comments: No deformity of the head present no raccoon eyes or battle sign noted.  Head was nontender during my examination. ?   Nose: No congestion.  ?   Mouth/Throat:  ?   Mouth: Mucous membranes are moist.  ?   Pharynx: Oropharynx is clear. No oropharyngeal exudate or posterior oropharyngeal erythema.  ?   Comments: No trismus no torticollis no oral trauma present. ?Eyes:  ?   Extraocular Movements: Extraocular movements intact.  ?   Conjunctiva/sclera: Conjunctivae normal.  ?   Pupils: Pupils are equal, round, and reactive to light.  ?Cardiovascular:  ?   Rate and Rhythm: Normal rate and regular rhythm.  ?   Pulses: Normal pulses.  ?   Heart sounds: No murmur heard. ?  No friction rub. No gallop.  ?Pulmonary:  ?   Effort: No respiratory distress.  ?   Breath sounds: No wheezing, rhonchi or rales.  ?   Comments: Patient is on 3 L via nasal cannula, showing no acute signs distress, nontachypneic nonhypoxic able speak in full sentences,  patient core sounding lungs bilateral. ?Chest:  ?   Chest wall: No tenderness.  ?Abdominal:  ?   Palpations: Abdomen is soft.  ?   Tenderness: There is no abdominal tenderness. There is no right CVA tenderness or left CVA tenderness.  ?Musculoskeletal:  ?   Comments: Spine was palpated slight tenderness to palpation along her cervical spine, she also has tenderness along her lumbar spine, no crepitus or deformities noted.  No pelvis instability no leg shortening, she does have slight tenderness on her left trochanter.  ?Skin: ?   General: Skin is warm and dry.  ?Neurological:  ?   Mental  Status: She is alert.  ?   Comments: No facial asymmetry no difficulty with word finding following two-step commands no unilateral weakness present.  ?Psychiatric:     ?   Mood and Affect: Mood normal.  ? ? ?ED Results / Procedures / Treatments   ?Labs ?(all labs ordered are listed, but only abnormal results are displayed) ?Labs Reviewed - No data to display ? ?EKG ?None ? ?Radiology ?No results found. ? ?Procedures ?Procedures  ? ? ?Medications Ordered in ED ?Medications  ?oxyCODONE-acetaminophen (PERCOCET/ROXICET) 5-325 MG per tablet 1 tablet (1 tablet Oral Given 03/30/22 1812)  ? ? ?ED Course/ Medical Decision Making/ A&P ?  ?                        ?Medical Decision Making ?Amount and/or Complexity of Data Reviewed ?Radiology: ordered. ? ?Risk ?Prescription drug management. ? ? ?This patient presents to the ED for concern of fall, this involves an extensive number of treatment options, and is a complaint that carries with it a high risk of complications and morbidity.  The differential diagnosis includes intraluminal head bleed, spinal fracture, intrathoracic/intra-abdominal trauma. ? ? ? ?Additional history obtained: ? ?Additional history obtained from N/A ?External records from outside source obtained and reviewed including previous discharge paperwork, imaging, pulmonology notes ? ? ?Co morbidities that complicate the patient  evaluation ? ?COPD, chronic pain syndrome ? ?Social Determinants of Health: ? ?N/A ? ? ? ?Lab Tests: ? ?I Ordered, and personally interpreted labs.  The pertinent results include: N/A ? ? ?Imaging Studies ordered:

## 2022-03-30 NOTE — ED Triage Notes (Signed)
Pt arrived via [facility] due to a fall 2 days ago from sleep walking.  Pt with c/o lower back pain and left leg pain.  Pt on home O2 at 3 L/M ?

## 2022-04-03 LAB — EXPECTORATED SPUTUM ASSESSMENT W GRAM STAIN, RFLX TO RESP C

## 2022-04-07 IMAGING — MR MR LUMBAR SPINE W/O CM
4 of 5 series · 24 of 48 positions shown · non-contrast
Comparison: MRI 01/10/2019

CLINICAL DATA: Low back pain with right-sided radiculopathy

EXAM:
MRI LUMBAR SPINE WITHOUT CONTRAST
TECHNIQUE: Multiplanar, multisequence MR imaging of the lumbar spine was
performed. No intravenous contrast was administered.

[Series 5: T2 · sagittal · 4.0mm · 0.73mm/px · 6 of 15 slices shown (1 of 2)]
[im 1/15]
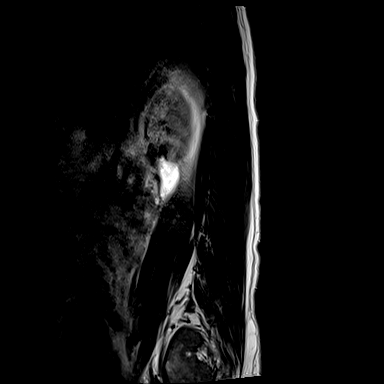
[im 3/15]
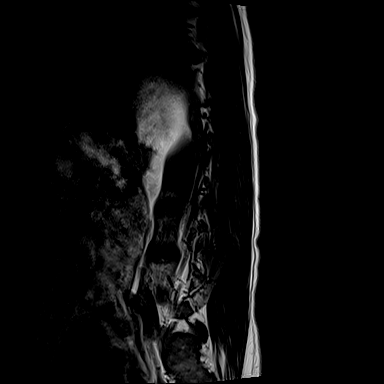
[im 6/15]
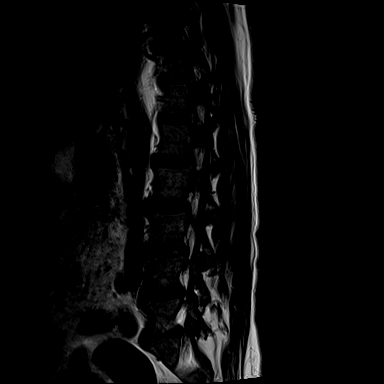
[im 9/15]
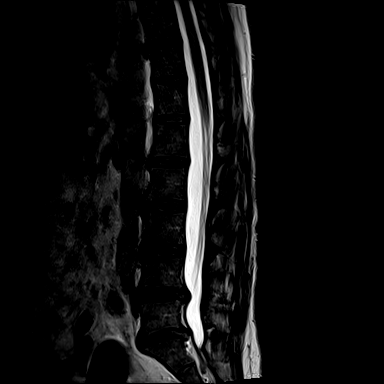
[im 12/15]
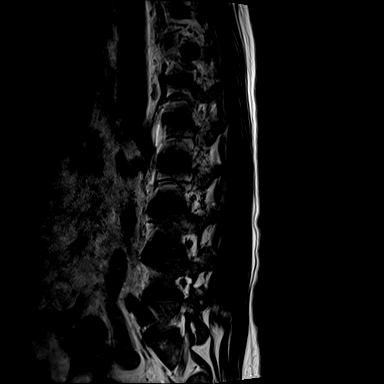
[im 15/15]
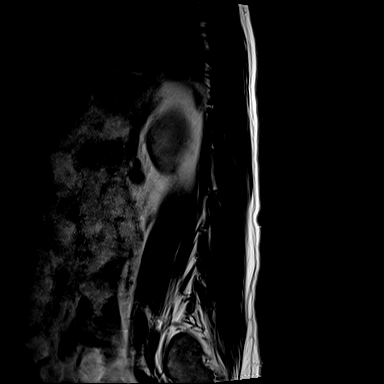

[Series 6: T1 · sagittal · 4.0mm · 0.73mm/px · 6 of 15 slices shown (1 of 2)]
[im 1/15]
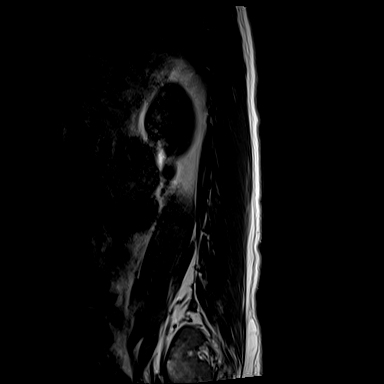
[im 3/15]
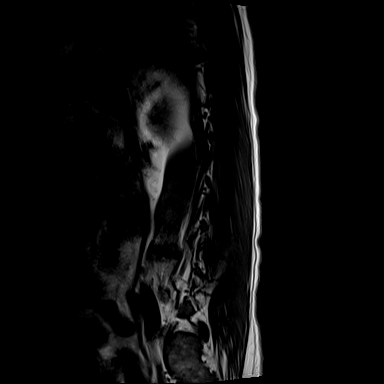
[im 6/15]
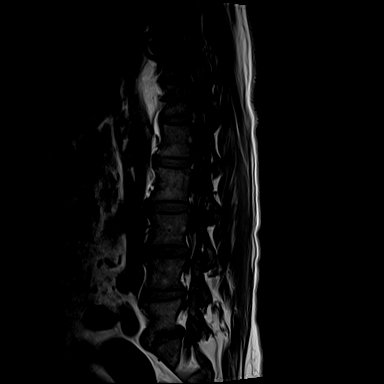
[im 9/15]
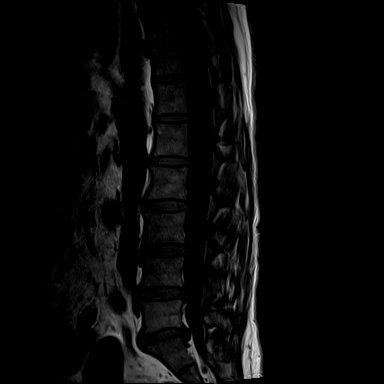
[im 12/15]
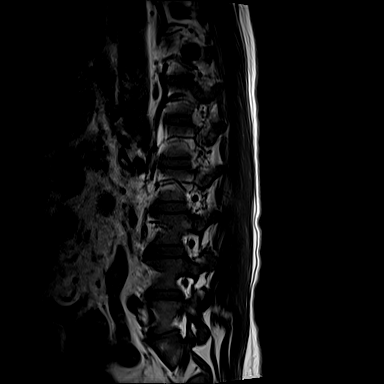
[im 15/15]
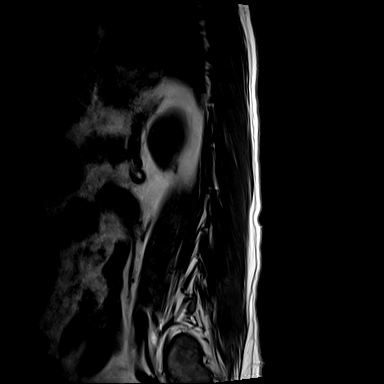

[Series 12: T2 · axial · 4.0mm · 0.35mm/px · z∈[-62,+130]mm · 9 of 39 slices shown (2 of 2)]
[im 1/39]
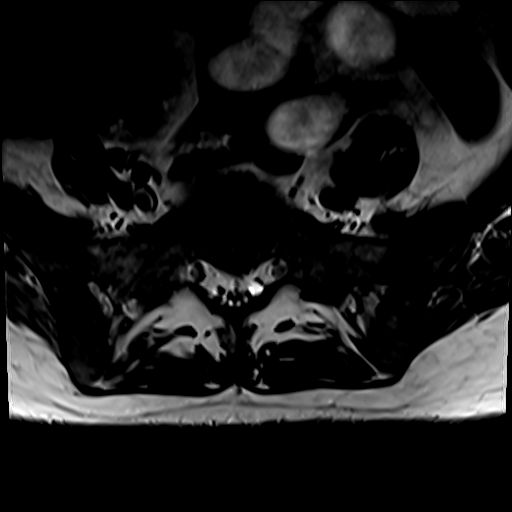
[im 6/39]
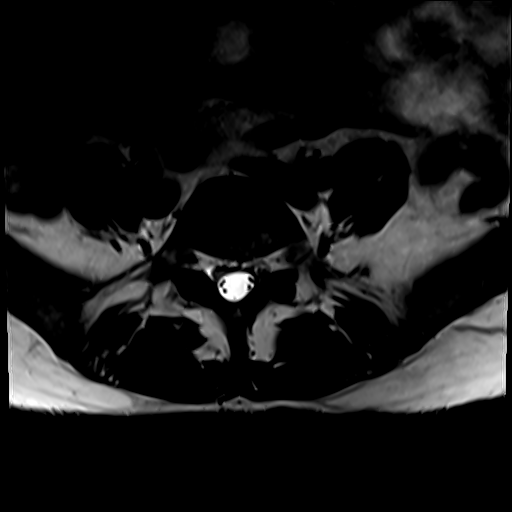
[im 11/39]
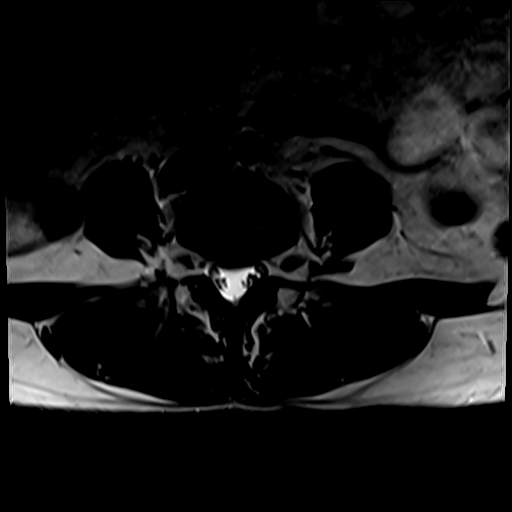
[im 17/39]
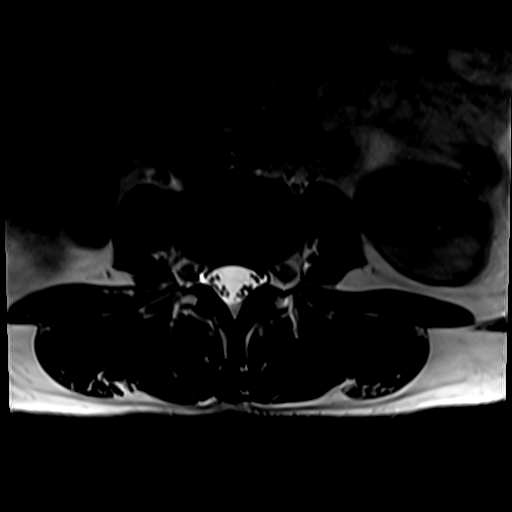
[im 20/39]
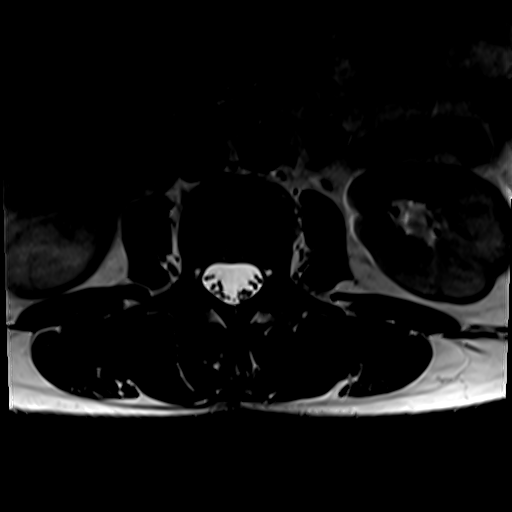
[im 22/39]
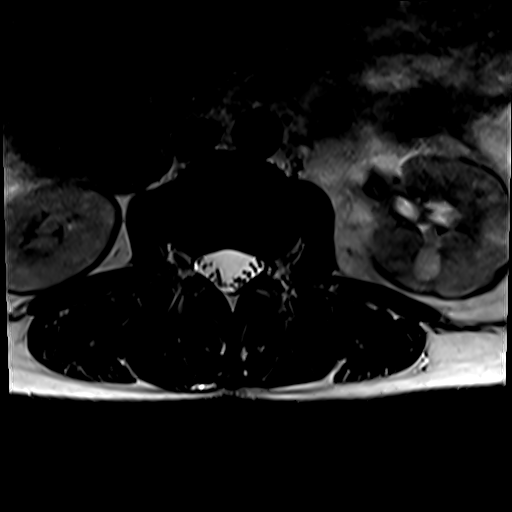
[im 28/39]
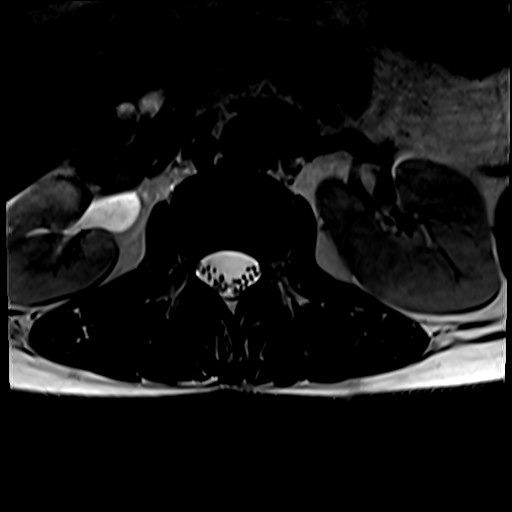
[im 33/39]
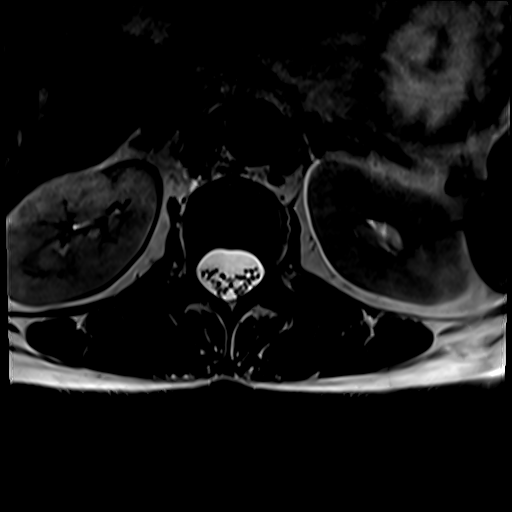
[im 39/39]
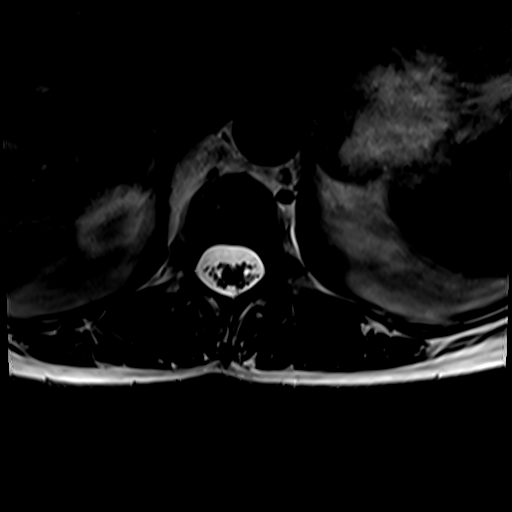

[Series 100: T1 · axial · 4.0mm · 0.35mm/px · z∈[-37,+100]mm · 3 of 39 slices shown (2 of 2)]
[im 6/39]
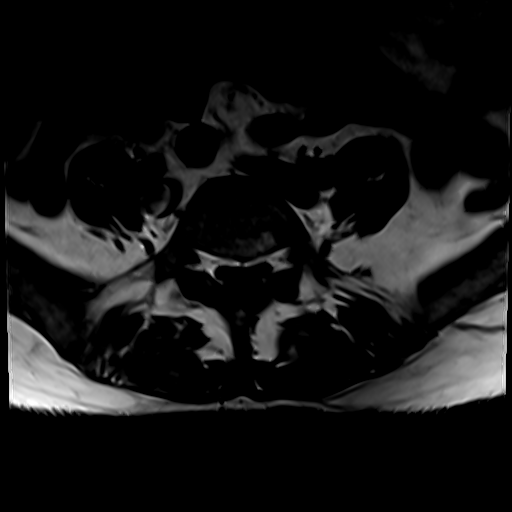
[im 20/39]
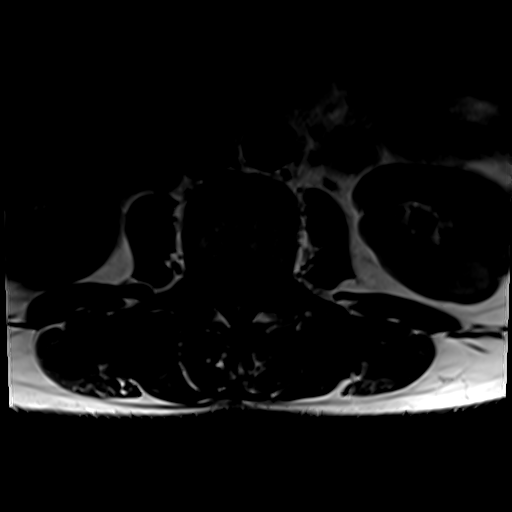
[im 33/39]
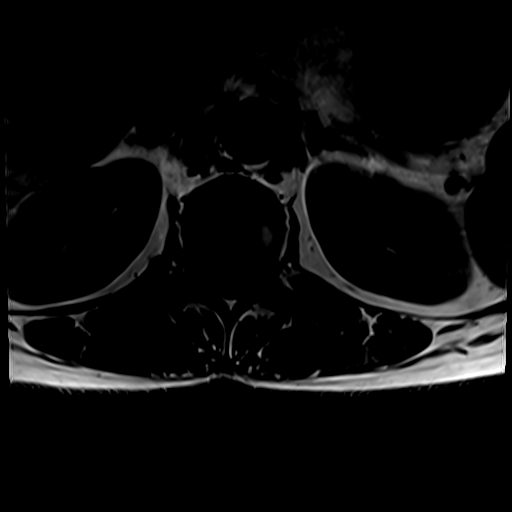

[24 of 48 positions shown; findings below may reference images not displayed]

FINDINGS: Segmentation:  Standard.

Alignment: Straightening of the lumbar lordosis without static
listhesis.

Vertebrae:  No fracture, evidence of discitis, or bone lesion.

Conus medullaris and cauda equina: Conus extends to the T12-L1
level. Conus and cauda equina appear normal.

Paraspinal and other soft tissues: Negative.

Disc levels:

T12-L1: No significant disc protrusion, foraminal stenosis, or canal
stenosis.

L1-L2: No significant disc protrusion, foraminal stenosis, or canal
stenosis.

L2-L3: No significant disc protrusion, foraminal stenosis, or canal
stenosis.

L3-L4: No significant disc protrusion, foraminal stenosis, or canal
stenosis.

L4-L5: Moderate-sized central disc protrusion resulting in severe
right and moderate left subarticular recess stenosis which is
similar to slightly increased in size compared to prior. No
significant canal or foraminal stenosis.

L5-S1: Moderate-large right paracentral disc extrusion resulting in
severe right subarticular recess stenosis and mild left subarticular
recess stenosis. No foraminal or canal stenosis. Findings progressed
from prior.
IMPRESSION: 1. Moderate-large right paracentral disc extrusion at L5-S1
resulting in severe right subarticular recess stenosis and mild left
subarticular recess stenosis. Findings have progressed from prior.
2. Moderate-sized central disc protrusion at L4-L5 resulting in
severe right and moderate left subarticular recess stenosis which is
similar to slightly increased in size compared to prior.
3. No canal stenosis at any level.

## 2022-04-12 ENCOUNTER — Other Ambulatory Visit: Payer: Self-pay

## 2022-04-12 ENCOUNTER — Encounter (HOSPITAL_BASED_OUTPATIENT_CLINIC_OR_DEPARTMENT_OTHER): Payer: Self-pay | Admitting: Emergency Medicine

## 2022-04-12 ENCOUNTER — Emergency Department (HOSPITAL_BASED_OUTPATIENT_CLINIC_OR_DEPARTMENT_OTHER)
Admission: EM | Admit: 2022-04-12 | Discharge: 2022-04-12 | Disposition: A | Discharge status: Home / Self Care | Payer: Medicaid Other | Attending: Emergency Medicine | Admitting: Emergency Medicine

## 2022-04-12 ENCOUNTER — Emergency Department (HOSPITAL_BASED_OUTPATIENT_CLINIC_OR_DEPARTMENT_OTHER): Payer: Medicaid Other | Admitting: Radiology

## 2022-04-12 DIAGNOSIS — W19XXXA Unspecified fall, initial encounter: Secondary | ICD-10-CM | POA: Insufficient documentation

## 2022-04-12 DIAGNOSIS — S22080A Wedge compression fracture of T11-T12 vertebra, initial encounter for closed fracture: Secondary | ICD-10-CM

## 2022-04-12 DIAGNOSIS — S24109A Unspecified injury at unspecified level of thoracic spinal cord, initial encounter: Secondary | ICD-10-CM | POA: Diagnosis present

## 2022-04-12 IMAGING — DX DG CHEST 1V PORT
1 series · 1 of 1 positions shown · non-contrast
Comparison: 09/27/2020

CLINICAL DATA: Cough.

EXAM:
PORTABLE CHEST 1 VIEW

[chest ap]
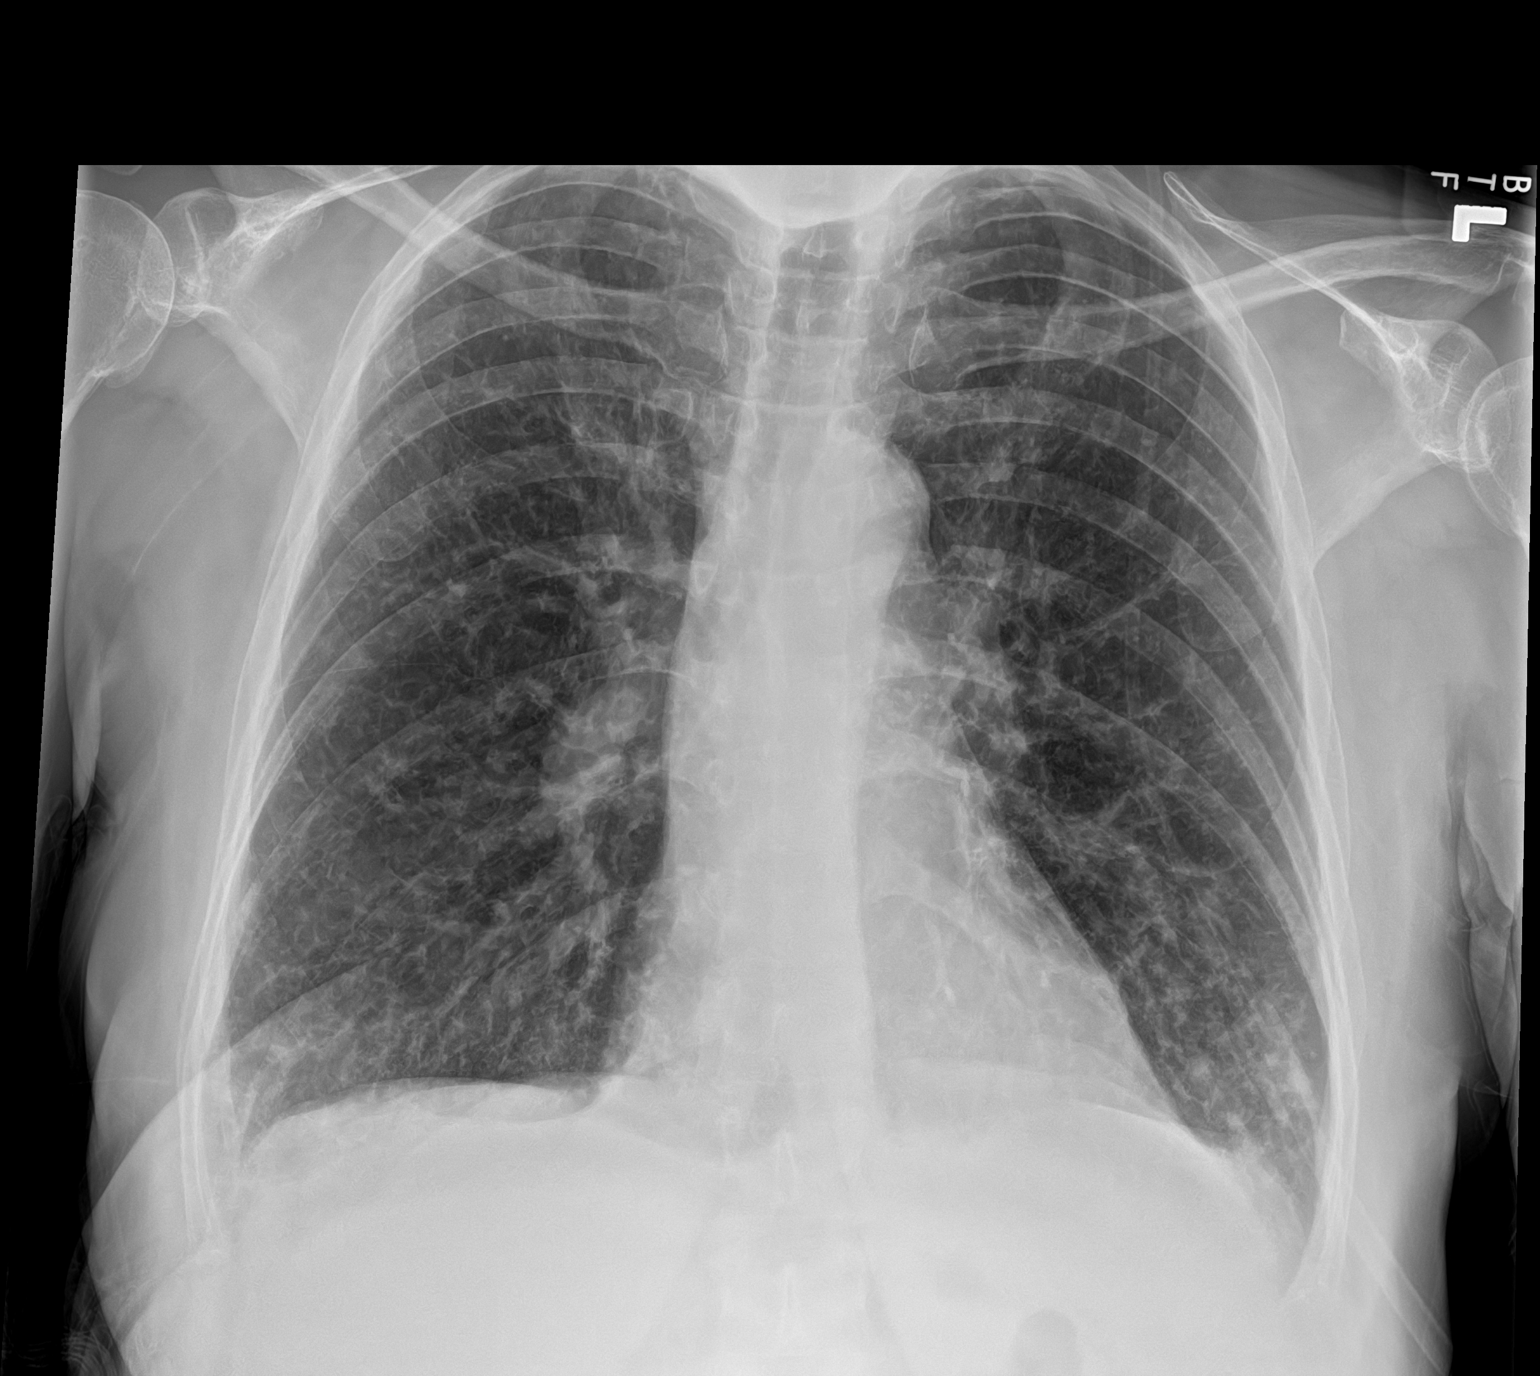

[1 of 1 positions shown; findings below may reference images not displayed]

FINDINGS: Lungs are adequately inflated with mild stable chronic interstitial
changes. No focal acute airspace process or effusion.
Cardiomediastinal silhouette and remainder of the exam is unchanged.
IMPRESSION: 1. No acute disease.
2. Mild stable chronic interstitial changes.

## 2022-04-12 MED ORDER — ONDANSETRON 4 MG PO TBDP
4.0000 mg | ORAL_TABLET | Freq: Once | ORAL | Status: AC
  Administered 2022-04-12: 4 mg via ORAL
  Filled 2022-04-12: qty 1

## 2022-04-12 MED ORDER — HYDROCODONE-ACETAMINOPHEN 5-325 MG PO TABS
1.0000 | ORAL_TABLET | Freq: Once | ORAL | Status: AC
  Administered 2022-04-12: 1 via ORAL
  Filled 2022-04-12: qty 1

## 2022-04-12 NOTE — ED Triage Notes (Signed)
Pt c/o fall causing pain to back. Pt was ambulating at the time and felt pain in left leg and back when she fell.

## 2022-04-12 NOTE — ED Notes (Signed)
Pt was able to ambulate self around room with no assistance. Pt still complaining of back and leg pain but states it feels better. Pt asking for more pain medicine. Pt given a diet coke.

## 2022-04-12 NOTE — Discharge Instructions (Signed)
If you develop worsening, recurrent, or continued back pain, numbness or weakness in the legs, incontinence of your bowels or bladders, numbness of your buttocks, fever, abdominal pain, or any other new/concerning symptoms then return to the ER for evaluation.  

## 2022-04-12 NOTE — ED Notes (Signed)
Pt refused ice, states she is too cold.

## 2022-04-12 NOTE — ED Provider Notes (Signed)
[facility] EMERGENCY DEPT Provider Note   CSN: [id number] Arrival date & time: 04/12/22  1339     History  Chief Complaint  Patient presents with   Fall   Back Pain    [name] is a 56 y.o. female.  HPI 55 year old female presents after a fall and multiple injuries, primarily to her left leg/buttocks.  She states that she fell while try to walk down as a concrete step.  She think she might of missed a step but definitely did not feel dizzy or lightheaded or pass out.  Did not hit her head.  Has some abrasions to her right lower leg which is painful but primarily she is complaining of pain in her left thigh, hip, and buttocks.  Also having some low back pain and inferior posterior rib pain and is concerned about rib injury.  States it hurts to breathe then.  No acute abdominal pain. She has been able to walk but it's painful.  Home Medications Prior to Admission medications   Medication Sig Start Date End Date Taking? Authorizing Provider  acetaminophen (TYLENOL) 500 MG tablet Take 500 mg by mouth every 6 (six) hours as needed for fever or mild pain.    [provider]  albuterol (PROVENTIL) (2.5 MG/3ML) 0.083% nebulizer solution Take 3 mLs (2.5 mg total) by nebulization every 6 (six) hours as needed for wheezing or shortness of breath. 03/13/22   Mariea Clonts, Courage, MD  albuterol (VENTOLIN HFA) 108 (90 Base) MCG/ACT inhaler Inhale 2 puffs into the lungs every 4 (four) hours as needed for wheezing or shortness of breath. 03/13/22   Emokpae, Courage, MD  fluticasone (FLONASE) 50 MCG/ACT nasal spray Place 2 sprays into both nostrils daily. 04/13/18   [provider]  gabapentin (NEURONTIN) 300 MG capsule Take 300 mg by mouth 3 (three) times daily. 07/31/21   [provider]  glucose blood test strip 1 each by Other route 3 (three) times daily. Use as instructed, accucheck test strips 01/04/20   Corum, Minerva Fester, MD  insulin glargine (LANTUS) 100 UNIT/ML  Solostar Pen Inject 18 Units into the skin daily. 03/13/22   Shon Hale, MD  Insulin Pen Needle 31G X 5 MM MISC Use as directed 03/12/20   Corum, Minerva Fester, MD  ipratropium-albuterol (DUONEB) 0.5-2.5 (3) MG/3ML SOLN Take 3 mLs by nebulization every 6 (six) hours as needed (shortness of breath). 10/31/21   [provider]  irbesartan (AVAPRO) 150 MG tablet TAKE 1 TABLET BY MOUTH EVERY DAY Patient taking differently: Take 150 mg by mouth daily. 05/29/21   Nyoka Cowden, MD  loratadine (CLARITIN) 10 MG tablet Take 10 mg by mouth daily. 08/13/21   [provider]  metFORMIN (GLUCOPHAGE) 1000 MG tablet Take 1 tablet (1,000 mg total) by mouth 2 (two) times daily with a meal. 03/13/22   Emokpae, Courage, MD  methocarbamol (ROBAXIN) 500 MG tablet Take 500 mg by mouth every 8 (eight) hours as needed for muscle spasms. 08/13/21   [provider]  ondansetron (ZOFRAN-ODT) 4 MG disintegrating tablet Take by mouth. 01/21/22   [provider]  pantoprazole (PROTONIX) 40 MG tablet Take 1 tablet (40 mg total) by mouth daily. 03/13/22   Shon Hale, MD  polyethylene glycol (MIRALAX / GLYCOLAX) 17 g packet Take 17 g by mouth daily. 03/13/22   Shon Hale, MD  rOPINIRole (REQUIP) 0.5 MG tablet Take 1 tablet (0.5 mg total) by mouth at bedtime. 03/13/22   Shon Hale, MD  rosuvastatin (  CRESTOR) 20 MG tablet Take 20 mg by mouth daily. 06/02/21   [provider]  SUMAtriptan (IMITREX) 50 MG tablet Take 50 mg by mouth as needed for migraine or headache. 11/29/21   [provider]  ARIPiprazole (ABILIFY) 5 MG tablet Take 5 mg by mouth daily.    02/03/12  [provider]  venlafaxine (EFFEXOR) 75 MG tablet Take 75 mg by mouth daily.    02/03/12  [provider]      Allergies    Levofloxacin, Levofloxacin in d5w, Penicillins, Doxycycline, and Morphine    Review of Systems   Review of Systems  Respiratory:  Negative for shortness of breath.    Cardiovascular:  Negative for chest pain.  Musculoskeletal:  Positive for arthralgias and back pain.  Skin:  Positive for wound.  Neurological:  Negative for headaches.   Physical Exam Updated Vital Signs BP (!) 153/91   Pulse 92   Temp 98.5 F (36.9 C)   Resp 18   Ht 5' (1.524 m)   Wt 59.9 kg   SpO2 98%   BMI 25.78 kg/m  Physical Exam Vitals and nursing note reviewed.  Constitutional:      Appearance: She is well-developed.  HENT:     Head: Normocephalic and atraumatic.  Cardiovascular:     Rate and Rhythm: Normal rate and regular rhythm.     Heart sounds: Normal heart sounds.  Pulmonary:     Effort: Pulmonary effort is normal.     Breath sounds: Wheezing (scant, diffuse) present.  Abdominal:     Palpations: Abdomen is soft.     Tenderness: There is no abdominal tenderness.  Musculoskeletal:     Lumbar back: Tenderness present.       Back:     Left hip: Tenderness present. Normal range of motion.     Left upper leg: Tenderness present. No deformity.     Left knee: Normal range of motion. No tenderness.     Right lower leg: Laceration (mild abrasions) and tenderness present. No swelling.     Left lower leg: No tenderness.       Legs:     Comments: Mild tenderness over inferior posterior ribs. No bruising.  Skin:    General: Skin is warm and dry.  Neurological:     Mental Status: She is alert.    ED Results / Procedures / Treatments   Labs (all labs ordered are listed, but only abnormal results are displayed) Labs Reviewed - No data to display  EKG None  Radiology DG Chest 2 View  Result Date: 04/12/2022 CLINICAL DATA:  Trauma, fall, pain EXAM: CHEST - 2 VIEW COMPARISON:  04/11/2022 FINDINGS: Cardiac size is within normal limits. Low position of diaphragms may suggest COPD. There is subtle prominence of interstitial markings in both lungs, more so on the right side. There is no new focal consolidation. There is no pleural effusion or pneumothorax.  IMPRESSION: Slight prominence of interstitial markings in both lungs may suggest scarring. There are no new infiltrates or signs of pulmonary edema. Electronically Signed   By: Ernie Avena M.D.   On: 04/12/2022 16:57   DG Lumbar Spine Complete  Result Date: 04/12/2022 CLINICAL DATA:  Trauma, fall EXAM: LUMBAR SPINE - COMPLETE 4+ VIEW COMPARISON:  09/09/2010 FINDINGS: No recent fracture is seen in the lumbar spine. There is mild decrease in height of upper endplate of body of T12 vertebra. This finding was not seen in the previous study. Alignment of posterior margins  of vertebral bodies is unremarkable. Degenerative changes are noted with disc space narrowing and bony spurs at L5-S1 level. Paraspinal soft tissues are unremarkable. IMPRESSION: There is mild 10-20% decrease in height of upper endplate of body of T12 vertebra which may suggest recent or old compression fracture. Lumbar spondylosis at L5-S1 level. Electronically Signed   By: Ernie Avena M.D.   On: 04/12/2022 17:01   DG Pelvis 1-2 Views  Result Date: 04/12/2022 CLINICAL DATA:  Trauma, fall EXAM: PELVIS - 1-2 VIEW COMPARISON:  None Available. FINDINGS: No recent fracture is seen. Degenerative changes with bony spurs are noted in the left hip. Degenerative changes are noted in the SI joints, more so on the left side. IMPRESSION: No recent fracture or dislocation is seen. Degenerative changes are noted in the left hip and SI joints. Electronically Signed   By: Ernie Avena M.D.   On: 04/12/2022 16:59   DG Tibia/Fibula Right  Result Date: 04/12/2022 CLINICAL DATA:  Trauma, fall EXAM: RIGHT TIBIA AND FIBULA - 2 VIEW COMPARISON:  Right knee done on 07/12/2020 FINDINGS: No fracture or dislocation is seen. There is no significant effusion in the suprapatellar bursa. IMPRESSION: No recent fracture is seen in the right tibia and fibula. Electronically Signed   By: Ernie Avena M.D.   On: 04/12/2022 16:58   DG Femur Min 2  Views Left  Result Date: 04/12/2022 CLINICAL DATA:  Trauma, fall EXAM: LEFT FEMUR 2 VIEWS COMPARISON:  None Available. FINDINGS: No recent fracture or dislocation is seen. Degenerative changes are noted with bony spurs in the left hip. There is no effusion in the left knee. IMPRESSION: No recent fracture is seen in the left femur. Degenerative changes are noted with bony spurs in the left hip. Electronically Signed   By: Ernie Avena M.D.   On: 04/12/2022 17:02    Procedures Procedures    Medications Ordered in ED Medications  HYDROcodone-acetaminophen (NORCO/VICODIN) 5-325 MG per tablet 1 tablet (1 tablet Oral Given 04/12/22 1608)  ondansetron (ZOFRAN-ODT) disintegrating tablet 4 mg (4 mg Oral Given 04/12/22 1751)  HYDROcodone-acetaminophen (NORCO/VICODIN) 5-325 MG per tablet 1 tablet (1 tablet Oral Given 04/12/22 1843)    ED Course/ Medical Decision Making/ A&P                           Medical Decision Making Amount and/or Complexity of Data Reviewed External Data Reviewed: notes. Radiology: ordered and independent interpretation performed.  Risk Prescription drug management.   Patient presents after mechanical fall.  X-rays reviewed/interpreted by myself and there are no fractures to the hip, pelvis, tib-fib and no obvious rib fractures or pneumothorax.  However there does appear to be a T12 compression fracture.  This is around the area she is hurting so I think it is acute.  We will get an LSO and I have given her oral pain medicine here.  She has been ambulatory so I doubt an occult hip fracture.  Otherwise, narcotic database reviewed reveals that she is gotten 2 different oxycodone prescriptions this month and should still have some so I do not think another prescription is required.  Pain is controlled currently.  Discharged home with return precautions.        Final Clinical Impression(s) / ED Diagnoses Final diagnoses:  Compression fracture of T12 vertebra, initial  encounter (HCC)    Rx / DC Orders ED Discharge Orders     None  Pricilla LovelessGoldston, Virat Prather, MD 04/12/22 [phone]

## 2022-04-12 NOTE — ED Notes (Signed)
RT Note: Pt. placed in [facility] RM#3, currently on 3 lpm n/c per home regimen.

## 2022-04-12 NOTE — ED Notes (Signed)
Discharge paperwork given and understood. Pt requested to ambulate outside. Pt was assisted outside. Pt used hospital O2 til arrival at the car and switchred to her own O2. Pt placed in front passenger with her O2.

## 2022-04-19 ENCOUNTER — Emergency Department (HOSPITAL_COMMUNITY): Payer: Medicaid Other

## 2022-04-19 ENCOUNTER — Emergency Department (HOSPITAL_COMMUNITY)
Admission: EM | Admit: 2022-04-19 | Discharge: 2022-04-19 | Disposition: A | Discharge status: Home / Self Care | Payer: Medicaid Other | Attending: Emergency Medicine | Admitting: Emergency Medicine

## 2022-04-19 ENCOUNTER — Other Ambulatory Visit: Payer: Self-pay

## 2022-04-19 ENCOUNTER — Encounter (HOSPITAL_COMMUNITY): Payer: Self-pay

## 2022-04-19 DIAGNOSIS — Z20822 Contact with and (suspected) exposure to covid-19: Secondary | ICD-10-CM | POA: Insufficient documentation

## 2022-04-19 DIAGNOSIS — Z79899 Other long term (current) drug therapy: Secondary | ICD-10-CM | POA: Insufficient documentation

## 2022-04-19 DIAGNOSIS — Z7951 Long term (current) use of inhaled steroids: Secondary | ICD-10-CM | POA: Diagnosis not present

## 2022-04-19 DIAGNOSIS — D72829 Elevated white blood cell count, unspecified: Secondary | ICD-10-CM | POA: Diagnosis not present

## 2022-04-19 DIAGNOSIS — E119 Type 2 diabetes mellitus without complications: Secondary | ICD-10-CM | POA: Insufficient documentation

## 2022-04-19 DIAGNOSIS — I10 Essential (primary) hypertension: Secondary | ICD-10-CM | POA: Diagnosis not present

## 2022-04-19 DIAGNOSIS — J189 Pneumonia, unspecified organism: Secondary | ICD-10-CM | POA: Diagnosis not present

## 2022-04-19 DIAGNOSIS — Z794 Long term (current) use of insulin: Secondary | ICD-10-CM | POA: Diagnosis not present

## 2022-04-19 DIAGNOSIS — Z7984 Long term (current) use of oral hypoglycemic drugs: Secondary | ICD-10-CM | POA: Insufficient documentation

## 2022-04-19 DIAGNOSIS — J441 Chronic obstructive pulmonary disease with (acute) exacerbation: Secondary | ICD-10-CM | POA: Insufficient documentation

## 2022-04-19 DIAGNOSIS — Z87891 Personal history of nicotine dependence: Secondary | ICD-10-CM | POA: Diagnosis not present

## 2022-04-19 DIAGNOSIS — E876 Hypokalemia: Secondary | ICD-10-CM | POA: Insufficient documentation

## 2022-04-19 DIAGNOSIS — R0781 Pleurodynia: Secondary | ICD-10-CM | POA: Diagnosis present

## 2022-04-19 LAB — BASIC METABOLIC PANEL
Anion gap: 7 (ref 5–15)
BUN: 6 mg/dL (ref 6–20)
CO2: 34 mmol/L — ABNORMAL HIGH (ref 22–32)
Calcium: 8.4 mg/dL — ABNORMAL LOW (ref 8.9–10.3)
Chloride: 92 mmol/L — ABNORMAL LOW (ref 98–111)
Creatinine, Ser: 0.33 mg/dL — ABNORMAL LOW (ref 0.44–1.00)
GFR, Estimated: >60 mL/min (ref >60)
Glucose, Bld: 246 mg/dL — ABNORMAL HIGH (ref 70–99)
Potassium: 3.2 mmol/L — ABNORMAL LOW (ref 3.5–5.1)
Sodium: 133 mmol/L — ABNORMAL LOW (ref 135–145)

## 2022-04-19 LAB — RESP PANEL BY RT-PCR (FLU A&B, COVID) ARPGX2
Influenza A by PCR: NEGATIVE
Influenza B by PCR: NEGATIVE
SARS Coronavirus 2 by RT PCR: NEGATIVE

## 2022-04-19 LAB — CBC
HCT: 36.1 % (ref 36.0–46.0)
Hemoglobin: 11.5 g/dL — ABNORMAL LOW (ref 12.0–15.0)
MCH: 26.7 pg (ref 26.0–34.0)
MCHC: 31.9 g/dL (ref 30.0–36.0)
MCV: 83.8 fL (ref 80.0–100.0)
Platelets: 311 10*3/uL (ref 150–400)
RBC: 4.31 MIL/uL (ref 3.87–5.11)
RDW: 13.4 % (ref 11.5–15.5)
WBC: 13.4 10*3/uL — ABNORMAL HIGH (ref 4.0–10.5)
nRBC: 0 % (ref 0.0–0.2)

## 2022-04-19 LAB — POC URINE PREG, ED: Preg Test, Ur: NEGATIVE

## 2022-04-19 LAB — HEPATIC FUNCTION PANEL
ALT: 9 U/L (ref 0–44)
AST: 11 U/L — ABNORMAL LOW (ref 15–41)
Albumin: 3.3 g/dL — ABNORMAL LOW (ref 3.5–5.0)
Alkaline Phosphatase: 107 U/L (ref 38–126)
Bilirubin, Direct: <0.1 mg/dL (ref 0.0–0.2)
Total Bilirubin: 0.3 mg/dL (ref 0.3–1.2)
Total Protein: 6.8 g/dL (ref 6.5–8.1)

## 2022-04-19 LAB — TROPONIN I (HIGH SENSITIVITY)
Troponin I (High Sensitivity): 3 ng/L (ref <18)
Troponin I (High Sensitivity): 3 ng/L (ref <18)

## 2022-04-19 LAB — MAGNESIUM: Magnesium: 1.7 mg/dL (ref 1.7–2.4)

## 2022-04-19 LAB — LIPASE, BLOOD: Lipase: 22 U/L (ref 11–51)

## 2022-04-19 MED ORDER — POTASSIUM CHLORIDE CRYS ER 20 MEQ PO TBCR
40.0000 meq | EXTENDED_RELEASE_TABLET | Freq: Once | ORAL | Status: AC
  Administered 2022-04-19: 40 meq via ORAL
  Filled 2022-04-19: qty 2

## 2022-04-19 MED ORDER — NITROGLYCERIN 2 % TD OINT
1.0000 [in_us] | TOPICAL_OINTMENT | Freq: Once | TRANSDERMAL | Status: AC
Start: 2022-04-19 — End: 2022-04-19
  Administered 2022-04-19: 1 [in_us] via TOPICAL
  Filled 2022-04-19: qty 1

## 2022-04-19 MED ORDER — METHYLPREDNISOLONE SODIUM SUCC 125 MG IJ SOLR
125.0000 mg | Freq: Once | INTRAMUSCULAR | Status: AC
  Administered 2022-04-19: 125 mg via INTRAVENOUS
  Filled 2022-04-19: qty 2

## 2022-04-19 MED ORDER — IPRATROPIUM-ALBUTEROL 0.5-2.5 (3) MG/3ML IN SOLN
3.0000 mL | Freq: Once | RESPIRATORY_TRACT | Status: AC
  Administered 2022-04-19: 3 mL via RESPIRATORY_TRACT
  Filled 2022-04-19: qty 3

## 2022-04-19 MED ORDER — SODIUM CHLORIDE 0.9 % IV SOLN
500.0000 mg | INTRAVENOUS | Status: DC
  Administered 2022-04-19: 500 mg via INTRAVENOUS
  Filled 2022-04-19: qty 5

## 2022-04-19 MED ORDER — DOXYCYCLINE HYCLATE 100 MG PO CAPS: 100.0000 mg | ORAL_CAPSULE | Freq: Two times a day (BID) | ORAL | 0 refills | Status: DC

## 2022-04-19 MED ORDER — SODIUM CHLORIDE 0.9 % IV SOLN
1.0000 g | Freq: Once | INTRAVENOUS | Status: AC
  Administered 2022-04-19: 1 g via INTRAVENOUS
  Filled 2022-04-19: qty 10

## 2022-04-19 MED ORDER — IOHEXOL 350 MG/ML SOLN
100.0000 mL | Freq: Once | INTRAVENOUS | Status: AC | PRN
  Administered 2022-04-19: 100 mL via INTRAVENOUS

## 2022-04-19 MED ORDER — ONDANSETRON HCL 4 MG/2ML IJ SOLN
4.0000 mg | Freq: Once | INTRAMUSCULAR | Status: AC
  Administered 2022-04-19: 4 mg via INTRAVENOUS
  Filled 2022-04-19: qty 2

## 2022-04-19 MED ORDER — ONDANSETRON 4 MG PO TBDP: ORAL_TABLET | ORAL | 0 refills | Status: AC

## 2022-04-19 MED ORDER — FENTANYL CITRATE PF 50 MCG/ML IJ SOSY
50.0000 ug | PREFILLED_SYRINGE | Freq: Once | INTRAMUSCULAR | Status: AC
Start: 2022-04-19 — End: 2022-04-19
  Administered 2022-04-19: 50 ug via INTRAVENOUS
  Filled 2022-04-19: qty 1

## 2022-04-19 NOTE — ED Triage Notes (Signed)
Pt c/o left pain above breast x 2 days. Nitro tablet and ASA 324mg  given via [facility]. IV 20 G to right AC. BS= 274 NS bolus given by [facility].

## 2022-04-19 NOTE — ED Notes (Signed)
Pt contacted and aware of plan, pt states, "I will follow up with  my doctor and come back if I need to."

## 2022-04-19 NOTE — Discharge Instructions (Signed)
Follow-up with your family doctor this week for recheck.  Return if any problem

## 2022-04-19 NOTE — ED Notes (Signed)
When the RN went to discharge the pt the IV had been removed and the pt was contacted on the phone to discuss AVS, the pt stated, "I have an allergy to Doxycycline and if he looked at my chart he would know that." The nurse confirmed the reaction to the medication as nausea and vomiting, the pt stated, "I have severe vomiting with it and cannot take it." This RN reported the conversation to Estell Harpin, MD and the MD reports that the pt should take the prescribed antibiotic with Zofran for nausea, the pt was updated on the phone

## 2022-04-19 NOTE — ED Provider Notes (Signed)
[facility] Provider Note   CSN: [id number] Arrival date & time: 04/19/22  1258     History  Chief Complaint  Patient presents with   Chest Pain    [name] is a 56 y.o. female.   Chest Pain Associated symptoms: cough, fatigue and shortness of breath   Patient presents for left-sided chest pain.  Onset was 2 days ago.  She feels that it did improve intermittently and she has been able to sleep.  Today, her discomfort continues, currently at 8/10 in severity.  She describes it as a pressure.  It does radiate to her left scapula.  Pain is pleuritic in nature.  Due to her ongoing symptoms, EMS was called.  324 of ASA was given prior to arrival.  Her medical history includes DM, smoking, COPD, GERD, anxiety, HTN.  At baseline, she is on 3 L of supplemental oxygen.  She typically utilizes nebulized breathing treatments daily.  Her last breathing treatment was yesterday.  She has had increased cough and green sputum production.  Currently, she feels that her breath is shallow.  She suspect this is due to the pleuritic nature of her chest pain.  Patient does not have any known cardiac history.    Home Medications Prior to Admission medications   Medication Sig Start Date End Date Taking? Authorizing Provider  doxycycline (VIBRAMYCIN) 100 MG capsule Take 1 capsule (100 mg total) by mouth 2 (two) times daily. One po bid x 7 days 04/19/22  Yes Milton Ferguson, MD  ondansetron (ZOFRAN-ODT) 4 MG disintegrating tablet 65m ODT q4 hours prn nausea/vomit 04/19/22  Yes ZMilton Ferguson MD  acetaminophen (TYLENOL) 500 MG tablet Take 500 mg by mouth every 6 (six) hours as needed for fever or mild pain.    [provider]  albuterol (PROVENTIL) (2.5 MG/3ML) 0.083% nebulizer solution Take 3 mLs (2.5 mg total) by nebulization every 6 (six) hours as needed for wheezing or shortness of breath. 03/13/22   EDenton Brick Courage, MD  albuterol (VENTOLIN HFA) 108 (90 Base) MCG/ACT inhaler  Inhale 2 puffs into the lungs every 4 (four) hours as needed for wheezing or shortness of breath. 03/13/22   Emokpae, Courage, MD  fluticasone (FLONASE) 50 MCG/ACT nasal spray Place 2 sprays into both nostrils daily. 04/13/18   [provider]  gabapentin (NEURONTIN) 300 MG capsule Take 300 mg by mouth 3 (three) times daily. 07/31/21   [provider]  glucose blood test strip 1 each by Other route 3 (three) times daily. Use as instructed, accucheck test strips 01/04/20   Corum, LRex Kras MD  insulin glargine (LANTUS) 100 UNIT/ML Solostar Pen Inject 18 Units into the skin daily. 03/13/22   ERoxan Hockey MD  Insulin Pen Needle 31G X 5 MM MISC Use as directed 03/12/20   Corum, LRex Kras MD  ipratropium-albuterol (DUONEB) 0.5-2.5 (3) MG/3ML SOLN Take 3 mLs by nebulization every 6 (six) hours as needed (shortness of breath). 10/31/21   [provider]  irbesartan (AVAPRO) 150 MG tablet TAKE 1 TABLET BY MOUTH EVERY DAY Patient taking differently: Take 150 mg by mouth daily. 05/29/21   WTanda Rockers MD  loratadine (CLARITIN) 10 MG tablet Take 10 mg by mouth daily. 08/13/21   [provider]  metFORMIN (GLUCOPHAGE) 1000 MG tablet Take 1 tablet (1,000 mg total) by mouth 2 (two) times daily with a meal. 03/13/22   Emokpae, Courage, MD  methocarbamol (ROBAXIN) 500 MG tablet Take 500 mg by mouth every 8 (  eight) hours as needed for muscle spasms. 08/13/21   [provider]  pantoprazole (PROTONIX) 40 MG tablet Take 1 tablet (40 mg total) by mouth daily. 03/13/22   Roxan Hockey, MD  polyethylene glycol (MIRALAX / GLYCOLAX) 17 g packet Take 17 g by mouth daily. 03/13/22   Roxan Hockey, MD  rOPINIRole (REQUIP) 0.5 MG tablet Take 1 tablet (0.5 mg total) by mouth at bedtime. 03/13/22   Roxan Hockey, MD  rosuvastatin (CRESTOR) 20 MG tablet Take 20 mg by mouth daily. 06/02/21   [provider]  SUMAtriptan (IMITREX) 50 MG tablet Take 50 mg by mouth as needed for  migraine or headache. 11/29/21   [provider]  ARIPiprazole (ABILIFY) 5 MG tablet Take 5 mg by mouth daily.    02/03/12  [provider]  venlafaxine (EFFEXOR) 75 MG tablet Take 75 mg by mouth daily.    02/03/12  [provider]      Allergies    Levofloxacin, Levofloxacin in d5w, Penicillins, Doxycycline, and Morphine    Review of Systems   Review of Systems  Constitutional:  Positive for chills and fatigue.  Respiratory:  Positive for cough, chest tightness and shortness of breath.   Cardiovascular:  Positive for chest pain.  All other systems reviewed and are negative.  Physical Exam Updated Vital Signs BP 126/82   Pulse 95   Temp 98.7 F (37.1 C) (Oral)   Resp (!) 22   Ht 5' (1.524 m)   Wt 59.9 kg   SpO2 96%   BMI 25.78 kg/m  Physical Exam Vitals and nursing note reviewed.  Constitutional:      General: She is not in acute distress.    Appearance: She is well-developed. She is ill-appearing. She is not toxic-appearing or diaphoretic.  HENT:     Head: Normocephalic and atraumatic.  Eyes:     Conjunctiva/sclera: Conjunctivae normal.  Cardiovascular:     Rate and Rhythm: Normal rate and regular rhythm.     Heart sounds: No murmur heard. Pulmonary:     Effort: Pulmonary effort is normal. No respiratory distress.     Breath sounds: Wheezing and rhonchi present.  Chest:     Chest wall: Tenderness present.  Abdominal:     Palpations: Abdomen is soft.     Tenderness: There is no abdominal tenderness.  Musculoskeletal:        General: No swelling. Normal range of motion.     Cervical back: Normal range of motion and neck supple.     Right lower leg: No edema.     Left lower leg: No edema.  Skin:    General: Skin is warm and dry.     Coloration: Skin is not cyanotic or pale.  Neurological:     General: No focal deficit present.     Mental Status: She is alert and oriented to person, place, and time.  Psychiatric:        Mood and Affect:  Mood normal.        Behavior: Behavior normal.    ED Results / Procedures / Treatments   Labs (all labs ordered are listed, but only abnormal results are displayed) Labs Reviewed  BASIC METABOLIC PANEL - Abnormal; Notable for the following components:      Result Value   Sodium 133 (*)    Potassium 3.2 (*)    Chloride 92 (*)    CO2 34 (*)    Glucose, Bld 246 (*)    Creatinine, Ser  0.33 (*)    Calcium 8.4 (*)    All other components within normal limits  CBC - Abnormal; Notable for the following components:   WBC 13.4 (*)    Hemoglobin 11.5 (*)    All other components within normal limits  HEPATIC FUNCTION PANEL - Abnormal; Notable for the following components:   Albumin 3.3 (*)    AST 11 (*)    All other components within normal limits  RESP PANEL BY RT-PCR (FLU A&B, COVID) ARPGX2  MAGNESIUM  LIPASE, BLOOD  POC URINE PREG, ED  TROPONIN I (HIGH SENSITIVITY)  TROPONIN I (HIGH SENSITIVITY)    EKG EKG Interpretation  Date/Time:  Sunday Apr 19 2022 13:33:56 EDT Ventricular Rate:  95 PR Interval:  188 QRS Duration: 129 QT Interval:  375 QTC Calculation: 472 R Axis:   92 Text Interpretation: Sinus rhythm RBBB and LPFB Confirmed by Godfrey Pick [phone]) on 04/19/2022 4:53:27 PM  Radiology DG Chest Port 1 View  Result Date: 04/19/2022 CLINICAL DATA:  Pain above the left breast for 2 days. Denies shortness of breath, fever. EXAM: PORTABLE CHEST 1 VIEW COMPARISON:  Chest radiograph dated March 10, 2022 FINDINGS: The heart size and mediastinal contours are within normal limits. No significant interval change in bilateral diffuse interstitial and airspace opacities suggesting chronic interstitial lung disease. No pleural effusion or pneumothorax. No acute osseous abnormality. IMPRESSION: 1. Prominent bilateral interstitial lung opacities concerning for chronic interstitial lung disease. Acute infectious process in the background chronic lung disease can not be excluded. 2.  No pleural  effusion or pneumothorax. Electronically Signed   By: Keane Police D.O.   On: 04/19/2022 14:25   CT Angio Chest/Abd/Pel for Dissection W and/or Wo Contrast  Result Date: 04/19/2022 CLINICAL DATA:  Pain above left breast for 2 days EXAM: CT ANGIOGRAPHY CHEST, ABDOMEN AND PELVIS TECHNIQUE: Non-contrast CT of the chest was initially obtained. Multidetector CT imaging through the chest, abdomen and pelvis was performed using the standard protocol during bolus administration of intravenous contrast. Multiplanar reconstructed images and MIPs were obtained and reviewed to evaluate the vascular anatomy. RADIATION DOSE REDUCTION: This exam was performed according to the departmental dose-optimization program which includes automated exposure control, adjustment of the mA and/or kV according to patient size and/or use of iterative reconstruction technique. CONTRAST:  115m OMNIPAQUE IOHEXOL 350 MG/ML SOLN COMPARISON:  04/19/2022, 08/17/2021 FINDINGS: CTA CHEST FINDINGS Cardiovascular: No evidence of thoracic aortic aneurysm or dissection. Minimal atherosclerosis of the aortic arch at the origin of the great vessels. On not optimized for the opacification of the pulmonary vasculature, there is adequate enhancement of the pulmonary vasculature. No filling defects or pulmonary emboli. The heart is unremarkable without pericardial effusion. Mediastinum/Nodes: Stable borderline enlarged mediastinal and hilar adenopathy. Largest lymph node in the precarinal region measures 13 mm in short axis, stable. Thyroid, trachea, and esophagus are unremarkable. Lungs/Pleura: Stable emphysema. There are stable bilateral areas of bronchial wall thickening and tree in bud airspace disease, greatest in the mid and lower lung zones. Findings are most consistent with chronic inflammatory change or indolent infection such as MAC. No new consolidation, effusion, or pneumothorax. Central airways are patent. Musculoskeletal: Since prior lumbar  spine CT 03/30/2022, compression deformity has developed within the superior endplate of the TC16vertebral body, with less than 10% loss of height. No retropulsion. Since prior chest CT 08/17/2021, mild compression deformity of the superior endplate of the T7 vertebral body has developed, with less than 10% loss of height. No retropulsion. No  other acute bony abnormalities. Reconstructed images demonstrate no additional findings. Review of the MIP images confirms the above findings. CTA ABDOMEN AND PELVIS FINDINGS VASCULAR Aorta: Normal caliber aorta without aneurysm, dissection, vasculitis or significant stenosis. Celiac: Patent without evidence of aneurysm, dissection, vasculitis or significant stenosis. SMA: Patent without evidence of aneurysm, dissection, vasculitis or significant stenosis. Renals: There are 2 right renal arteries with a small accessory right renal artery supplying portions of the lower pole. The bilateral renal arteries are patent without evidence of aneurysm, dissection, vasculitis, fibromuscular dysplasia or significant stenosis. Mild atherosclerosis at the origin of the left renal artery. IMA: Patent without evidence of aneurysm, dissection, vasculitis or significant stenosis. Inflow: Patent without evidence of aneurysm, dissection, vasculitis or significant stenosis. Veins: No obvious venous abnormality within the limitations of this arterial phase study. Review of the MIP images confirms the above findings. NON-VASCULAR Hepatobiliary: Liver is grossly unremarkable. No evidence of cholelithiasis or cholecystitis. Chronic dilation of the common bile duct, measuring 9 mm on today's exam. The intrahepatic duct dilation seen on prior studies is not appreciated today Pancreas: Unremarkable. No pancreatic ductal dilatation or surrounding inflammatory changes. Spleen: Normal in size without focal abnormality. Adrenals/Urinary Tract: Adrenal glands are unremarkable. Kidneys are normal, without  renal calculi, focal lesion, or hydronephrosis. Bladder is unremarkable. Stomach/Bowel: No bowel obstruction or ileus. No bowel wall thickening or inflammatory change. Lymphatic: No pathologic adenopathy within the abdomen or pelvis. Reproductive: Uterus and bilateral adnexa are unremarkable. Other: No free fluid or free intraperitoneal gas. Small fat containing supraumbilical hernia. No bowel herniation. Musculoskeletal: Reconstructed images demonstrate no additional findings. Reconstructed images demonstrate no additional findings. Review of the MIP images confirms the above findings. IMPRESSION: Vascular: 1. No evidence of thoracoabdominal aortic aneurysm or dissection. 2. No evidence of pulmonary embolus. 3.  Aortic Atherosclerosis (ICD10-I70.0). Nonvascular: 1. Chronic bilateral bronchial wall thickening and diffuse tree in bud airspace disease, most consistent with chronic inflammatory change or indolent atypical infection such as MAC. No change since prior exam. 2. Stable mediastinal and hilar lymphadenopathy, likely reactive. 3. Acute to subacute compression fractures involving the superior endplates at T7 and Z85 as above. Less than 10% loss of height at each location, with no retropulsion. 4. Chronic nonspecific dilation of the common bile duct. 5.  Emphysema (ICD10-J43.9). Electronically Signed   By: Randa Ngo M.D.   On: 04/19/2022 16:44    Procedures Procedures    Medications Ordered in ED Medications  fentaNYL (SUBLIMAZE) injection 50 mcg (50 mcg Intravenous Given 04/19/22 1408)  nitroGLYCERIN (NITROGLYN) 2 % ointment 1 inch (1 inch Topical Given 04/19/22 1407)  cefTRIAXone (ROCEPHIN) 1 g in sodium chloride 0.9 % 100 mL IVPB (0 g Intravenous Stopped 04/19/22 1625)  iohexol (OMNIPAQUE) 350 MG/ML injection 100 mL (100 mLs Intravenous Contrast Given 04/19/22 1607)  ondansetron (ZOFRAN) injection 4 mg (4 mg Intravenous Given 04/19/22 1538)  potassium chloride SA (KLOR-CON M) CR tablet 40 mEq  (40 mEq Oral Given 04/19/22 1537)  ipratropium-albuterol (DUONEB) 0.5-2.5 (3) MG/3ML nebulizer solution 3 mL (3 mLs Nebulization Given 04/19/22 1637)  methylPREDNISolone sodium succinate (SOLU-MEDROL) 125 mg/2 mL injection 125 mg (125 mg Intravenous Given 04/19/22 1628)    ED Course/ Medical Decision Making/ A&P                           Medical Decision Making Amount and/or Complexity of Data Reviewed Labs: ordered. Radiology: ordered.  Risk Prescription drug management.   This patient presents  to the ED for concern of left-sided chest pain, this involves an extensive number of treatment options, and is a complaint that carries with it a high risk of complications and morbidity.  The differential diagnosis includes pneumonia, COPD exacerbation, ACS, PE, neoplasm, dissection   Co morbidities that complicate the patient evaluation  DM, smoking, COPD, GERD, anxiety, HTN   Additional history obtained:  Additional history obtained from N/A External records from outside source obtained and reviewed including EMR   Lab Tests:  I Ordered, and personally interpreted labs.  The pertinent results include: Mild hypokalemia, baseline anemia, mild leukocytosis, normal troponins x2   Imaging Studies ordered:  I ordered imaging studies including chest x-ray, CTA dissection study I independently visualized and interpreted imaging which showed no evidence of aortic pathology, no evidence of PE; chronic inflammatory change of bronchial walls and airspaces; age-indeterminate superior endplate fractures of T7 and T12 I agree with the radiologist interpretation   Cardiac Monitoring: / EKG:  The patient was maintained on a cardiac monitor.  I personally viewed and interpreted the cardiac monitored which showed an underlying rhythm of: Sinus rhythm   Consultations Obtained:  I requested consultation with the STEMI cardiologist on-call, Dr. Ali Lowe,  and discussed lab and imaging findings as  well as pertinent plan - they recommend: EKG not consistent with STEMI or STEMI equivalent.   Problem List / ED Course / Critical interventions / Medication management  Patient is a 56 year old female presenting for left-sided chest pain.  This has been ongoing for the past 2 days.  She does not have any known cardiac history but does have multiple risk factors for ACS.  On EKG, does appear that she has new evidence of bundle branch blocks.  This was discussed with STEMI doctor on-call who does not believe that this should be treated as a STEMI equivalent.  Patient was given ASA prior to arrival.  On arrival, she is endorsing 8/10 severity pain.  No murmurs are appreciated on cardiac auscultation.  She does have significant wheezing and rhonchi throughout her lung fields consistent with COPD.  Patient was kept on bedside cardiac monitor.  NTG and fentanyl were given for her chest pain.  Laboratory work-up was initiated.  Given the radiation of her chest pain to her back, patient underwent CTA dissection study.  Results showed no aortic pathology and no PE.  There were findings of chronic versus acute on chronic airway and interstitial inflammation.  Initial troponin was normal.  Lab work was notable for hypokalemia.  Replacing potassium was given in the ED.  Treatment for COPD exacerbation was initiated with Solu-Medrol, DuoNeb, ceftriaxone, and azithromycin.  At time of signout, delta troponin was pending.  Patient will require reassessment.  Care of patient was signed out to oncoming ED provider. I ordered medication including NTG and fentanyl for chest pain; potassium chloride for hypokalemia; Zofran for nausea; DuoNeb, Solu-Medrol, Rocephin, and azithromycin for COPD exacerbation Reevaluation of the patient after these medicines showed that the patient improved I have reviewed the patients home medicines and have made adjustments as needed   Social Determinants of Health:  Lives  independently         Final Clinical Impression(s) / ED Diagnoses Final diagnoses:  COPD exacerbation ([place])  Atypical pneumonia    Rx / DC Orders ED Discharge Orders          Ordered    doxycycline (VIBRAMYCIN) 100 MG capsule  2 times daily  04/19/22 1659    ondansetron (ZOFRAN-ODT) 4 MG disintegrating tablet        04/19/22 1659              Godfrey Pick, MD 04/20/22 1017

## 2022-04-22 DIAGNOSIS — R9431 Abnormal electrocardiogram [ECG] [EKG]: Secondary | ICD-10-CM | POA: Insufficient documentation

## 2022-04-22 DIAGNOSIS — M2550 Pain in unspecified joint: Secondary | ICD-10-CM | POA: Insufficient documentation

## 2022-06-26 NOTE — Progress Notes (Deleted)
Cardiology Office Note:   Date:  06/26/2022  NAME:  [name]    MRN: [id number] DOB:  [birth year]   PCP:  [facility]  Cardiologist:  None  Electrophysiologist:  None   Referring MD: Katherine Basset*   No chief complaint on file. ***  History of Present Illness:   [name] is a 56 y.o. female with a hx of COPD, diabetes, hypertension who is being seen today for the evaluation of chest pain, abnormal EKG at the request of Nsumanganyi, Colleen Can, NP.  Problem List COPD DM HTN HLD  Past Medical History: Past Medical History:  Diagnosis Date   Angina    Anxiety state 10/15/2015   ARDS (adult respiratory distress syndrome) (HCC)    Jan 2011   Asthma    Chronic back pain    COPD (chronic obstructive pulmonary disease) (HCC)    Diabetes mellitus    GERD (gastroesophageal reflux disease) 12/21/2012   HTN (hypertension) 10/15/2015   Hyperglycemia, drug-induced    steroid induced hyperglycemia   Migraine headache    On home O2    Pneumonia    Recurrent upper respiratory infection (URI)    Shortness of breath     Past Surgical History: Past Surgical History:  Procedure Laterality Date   c-section     TRACHEOSTOMY     decannulated 12/2009   TUBAL LIGATION     uterine ablasion      Current Medications: No outpatient medications have been marked as taking for the 07/01/22 encounter (Appointment) with Sande Rives, MD.     Allergies:    Levofloxacin, Levofloxacin in d5w, Penicillins, Doxycycline, and Morphine   Social History: Social History   Socioeconomic History   Marital status: Single    Spouse name: Not on file   Number of children: Not on file   Years of education: Not on file   Highest education level: Not on file  Occupational History   Not on file  Tobacco Use   Smoking status: Every Day    Packs/day: 0.50    Years: 42.00    Total pack years: 21.00    Types: Cigarettes   Smokeless tobacco: Never  Vaping  Use   Vaping Use: Never used  Substance and Sexual Activity   Alcohol use: No   Drug use: No    Comment: Formerly used cocaine , narcotics, THC   Sexual activity: Yes    Birth control/protection: Other-see comments  Other Topics Concern   Not on file  Social History Narrative   Living with her mom.    Social Determinants of Health   Financial Resource Strain: Not on file  Food Insecurity: Not on file  Transportation Needs: Not on file  Physical Activity: Not on file  Stress: Not on file  Social Connections: Not on file     Family History: The patient's ***family history includes Cancer in an other family member; Coronary artery disease in her brother; Diabetes in an other family member; Hypertension in an other family member.  ROS:   All other ROS reviewed and negative. Pertinent positives noted in the HPI.     EKGs/Labs/Other Studies Reviewed:   The following studies were personally reviewed by me today:  EKG:  EKG is *** ordered today.  The ekg ordered today demonstrates ***, and was personally reviewed by me.   Recent Labs: 04/19/2022: ALT 9; BUN 6; Creatinine, Ser 0.33; Hemoglobin 11.5; Magnesium 1.7; Platelets 311; Potassium 3.2; Sodium  133   Recent Lipid Panel    Component Value Date/Time   TRIG 209 (H) 12/11/2009 0425    Physical Exam:   VS:  There were no vitals taken for this visit.   Wt Readings from Last 3 Encounters:  04/19/22 132 lb (59.9 kg)  04/12/22 132 lb (59.9 kg)  03/30/22 132 lb (59.9 kg)    General: Well nourished, well developed, in no acute distress Head: Atraumatic, normal size  Eyes: PEERLA, EOMI  Neck: Supple, no JVD Endocrine: No thryomegaly Cardiac: Normal S1, S2; RRR; no murmurs, rubs, or gallops Lungs: Clear to auscultation bilaterally, no wheezing, rhonchi or rales  Abd: Soft, nontender, no hepatomegaly  Ext: No edema, pulses 2+ Musculoskeletal: No deformities, BUE and BLE strength normal and equal Skin: Warm and dry, no rashes    Neuro: Alert and oriented to person, place, time, and situation, CNII-XII grossly intact, no focal deficits  Psych: Normal mood and affect   ASSESSMENT:   [name] is a 56 y.o. female who presents for the following: No diagnosis found.  PLAN:   There are no diagnoses linked to this encounter.  {Are you ordering a CV Procedure (e.g. stress test, cath, DCCV, TEE, etc)?   Press F2        :[id number]}  Disposition: No follow-ups on file.  Medication Adjustments/Labs and Tests Ordered: Current medicines are reviewed at length with the patient today.  Concerns regarding medicines are outlined above.  No orders of the defined types were placed in this encounter.  No orders of the defined types were placed in this encounter.   There are no Patient Instructions on file for this visit.   Time Spent with Patient: I have spent a total of *** minutes with patient reviewing hospital notes, telemetry, EKGs, labs and examining the patient as well as establishing an assessment and plan that was discussed with the patient.  > 50% of time was spent in direct patient care.  Signed, Lenna Gilford. Flora Lipps, MD, [facility]  [facility]  [address], [address] [place], [place] [address] [phone]  06/26/2022 1:08 PM

## 2022-07-01 ENCOUNTER — Ambulatory Visit: Payer: Medicaid Other | Admitting: Cardiovascular Disease

## 2022-07-01 DIAGNOSIS — R072 Precordial pain: Secondary | ICD-10-CM

## 2022-07-01 DIAGNOSIS — I4510 Unspecified right bundle-branch block: Secondary | ICD-10-CM

## 2022-07-23 ENCOUNTER — Ambulatory Visit: Payer: Medicaid Other | Admitting: Adult Health

## 2022-09-21 IMAGING — DX DG CHEST 1V PORT
1 series · 1 of 1 positions shown · non-contrast
Comparison: March 07, 2021

CLINICAL DATA: Cough, shortness of breath

EXAM:
PORTABLE CHEST 1 VIEW

[chest ap]
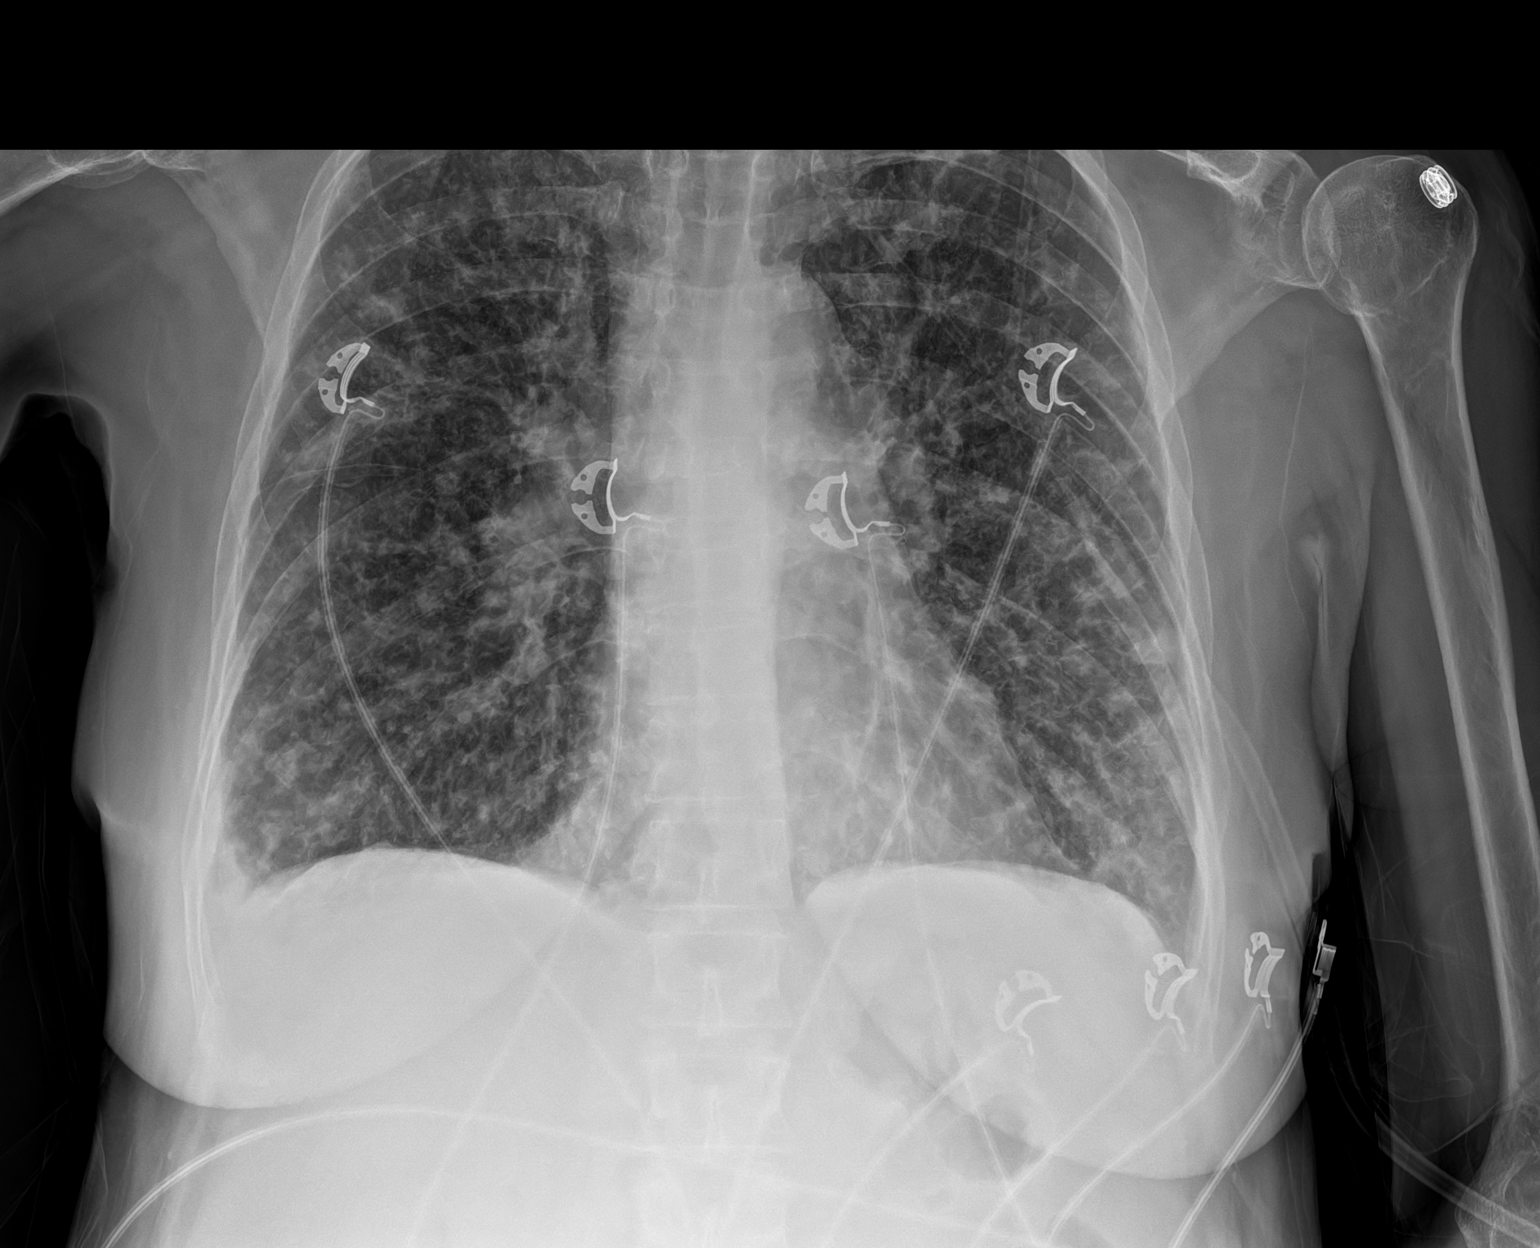

[1 of 1 positions shown; findings below may reference images not displayed]

FINDINGS: The cardiomediastinal silhouette is unchanged in contour. No pleural
effusion. No pneumothorax. Increased diffuse bilateral
reticulonodular opacities in a mildly peripheral predominance.
Peribronchial cuffing. Visualized abdomen is unremarkable.
IMPRESSION: Increased diffuse bilateral reticulonodular opacities in a
peripheral predominant pattern. Findings likely are infectious in
etiology.

## 2022-09-22 IMAGING — CT CT ABD-PELV W/ CM
2 of 5 series · 14 of 46 positions shown, 16 images · IV contrast (omnipaque)
Comparison: 04/21/2020

CLINICAL DATA: Positive D-dimer. Chest pain and shortness of
breath. Rule out pulmonary embolus. Abdominal pain.

EXAM:
CT ANGIOGRAPHY CHEST
CT ABDOMEN AND PELVIS WITH CONTRAST
TECHNIQUE: Multidetector CT imaging of the chest was performed using the
standard protocol during bolus administration of intravenous
contrast. Multiplanar CT image reconstructions and MIPs were
obtained to evaluate the vascular anatomy. Multidetector CT imaging
of the abdomen and pelvis was performed using the standard protocol
during bolus administration of intravenous contrast.
CONTRAST:  100mL OMNIPAQUE IOHEXOL 350 MG/ML SOLN

[Series 3: abd/pel w/ · axial · 0.81mm/px · z∈[+742,+1097]mm · 11 of 83 slices shown, 13 images]
[im 6/83  soft-tissue]
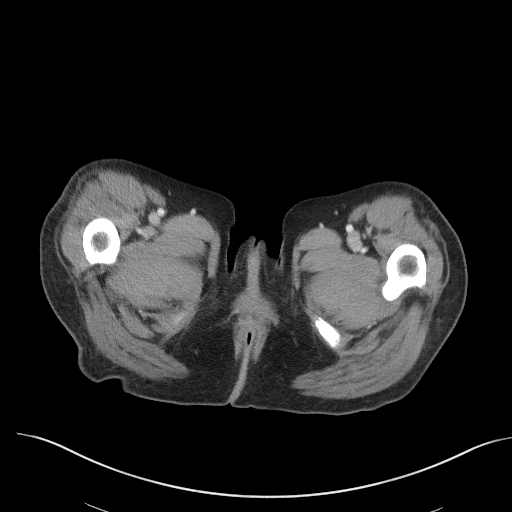
[im 6/83  bone]
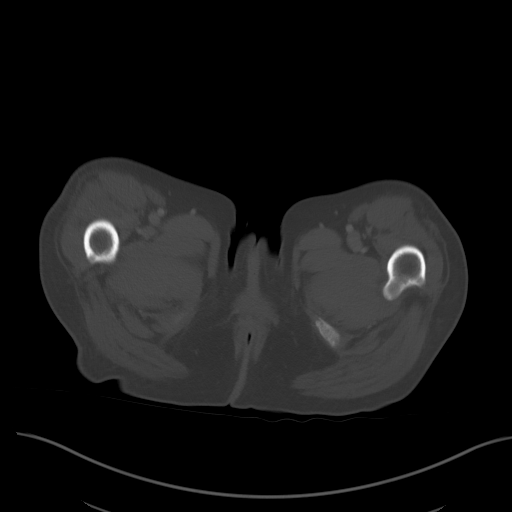
[im 12/83  soft-tissue]
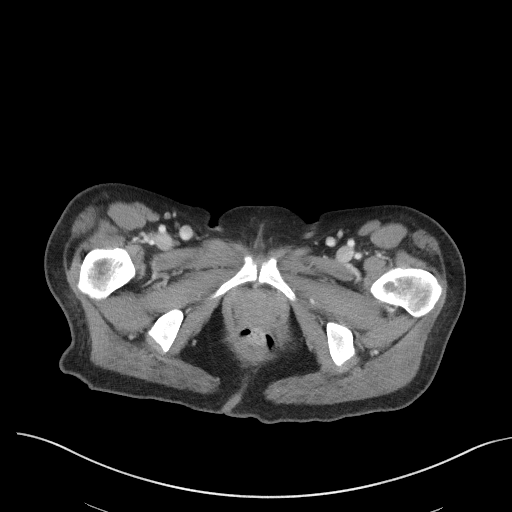
[im 18/83  soft-tissue]
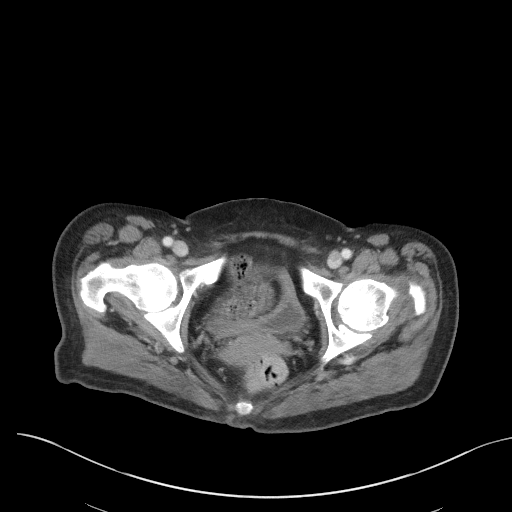
[im 30/83  soft-tissue]
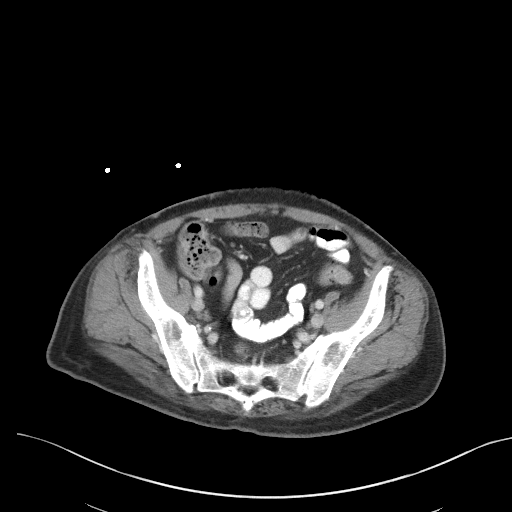
[im 36/83  soft-tissue]
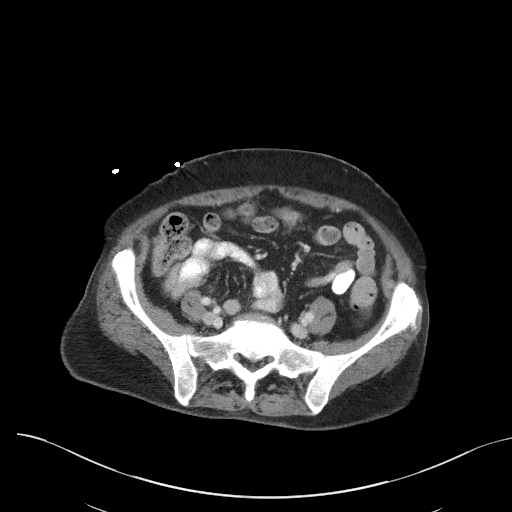
[im 42/83  soft-tissue]
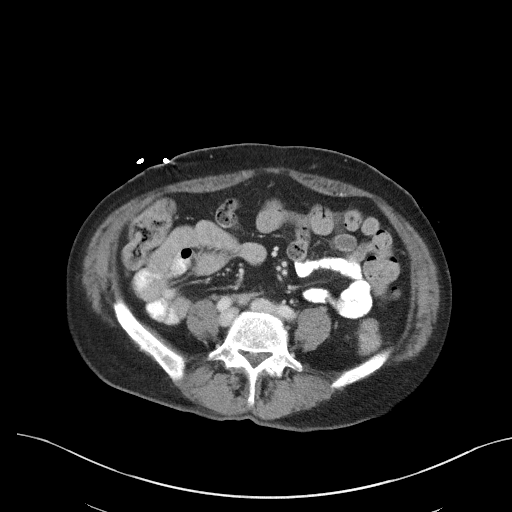
[im 47/83  soft-tissue]
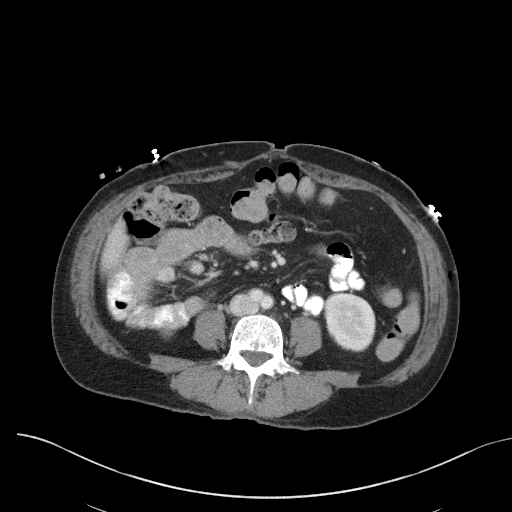
[im 53/83  soft-tissue]
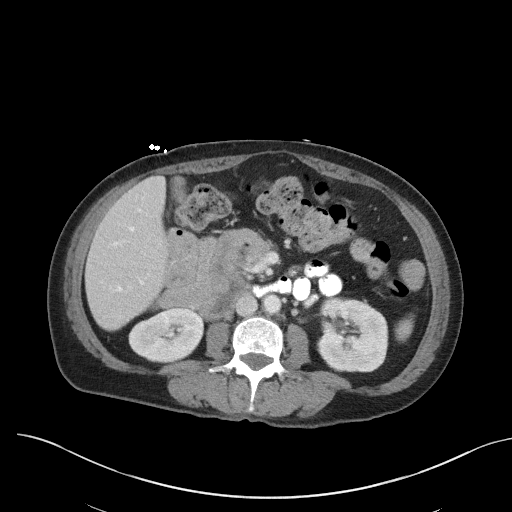
[im 65/83  soft-tissue]
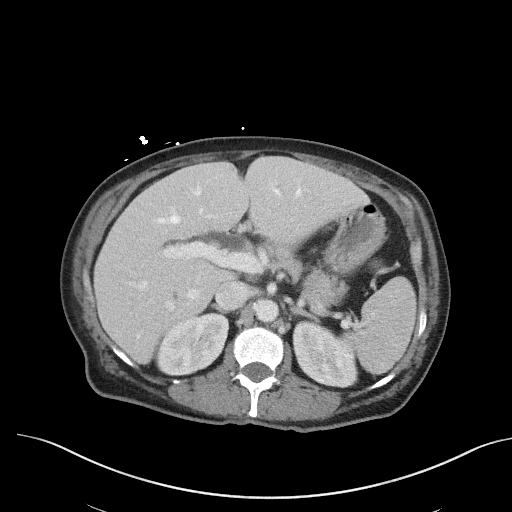
[im 65/83  bone]
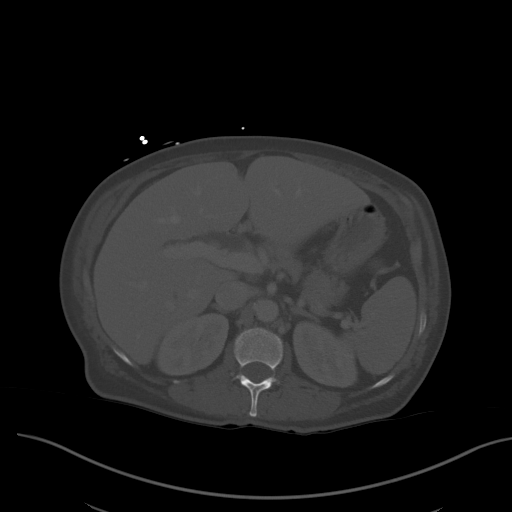
[im 71/83  soft-tissue]
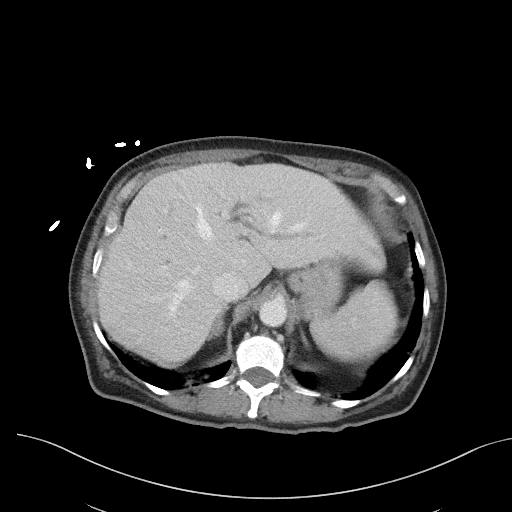
[im 77/83  soft-tissue]
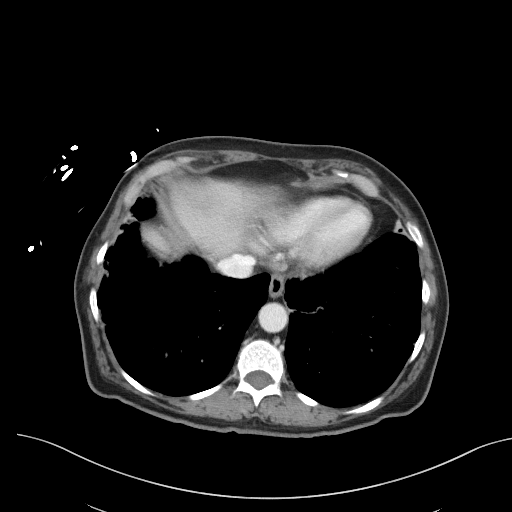

[Series 6: coronal · coronal · 0.74mm/px · 3 of 101 slices shown]
[im 34/101  soft-tissue]
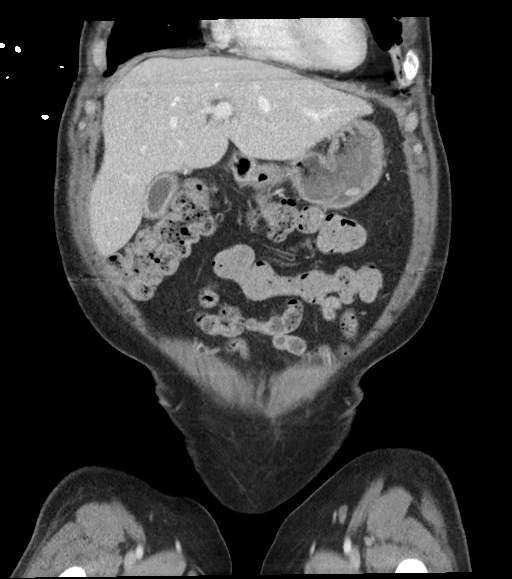
[im 45/101  soft-tissue]
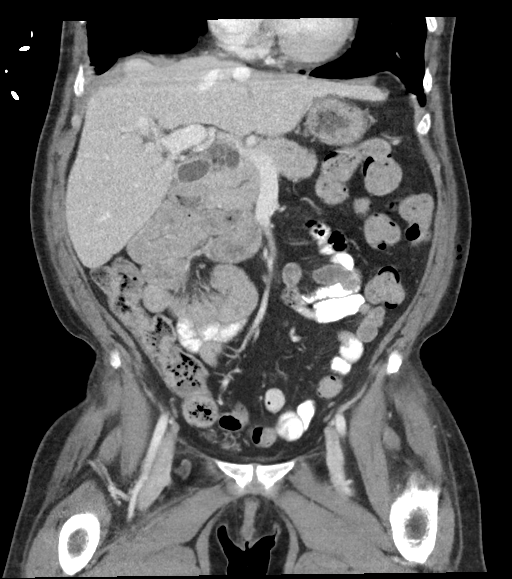
[im 56/101  soft-tissue]
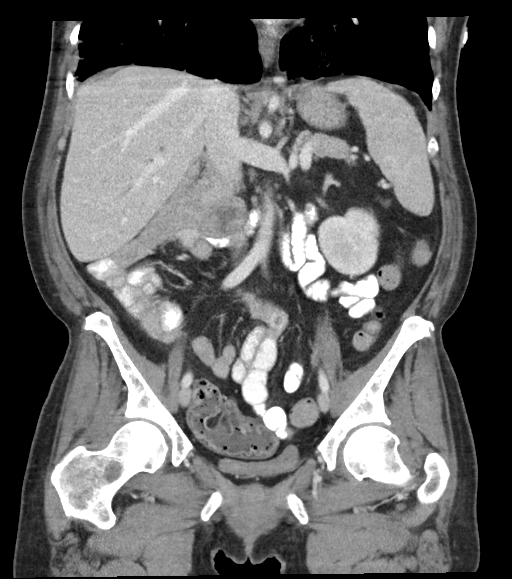

[14 of 46 positions shown; findings below may reference images not displayed]

FINDINGS: CTA CHEST FINDINGS

Cardiovascular: Satisfactory opacification of the pulmonary arteries
to the segmental level. No evidence of pulmonary embolism. Normal
heart size. No pericardial effusion.

Mediastinum/Nodes: Thyroid gland, trachea, and esophagus demonstrate
no significant findings. Mediastinal and hilar adenopathy is noted.
Index right paratracheal lymph node measures 1.5 cm, image 33/5.
Formally 1.1 cm. Subcarinal lymph node measures 1.6 cm, image 47/5.
Previously 1.2 cm. Left hilar lymph node measures 1.4 cm, image
50/5. Previously 0.8 cm. Right hilar lymph node measures 1.6 cm,
image 50/4. Previously 1.2 cm. No supraclavicular or axillary
adenopathy.

Lungs/Pleura: No pleural effusion. Paraseptal and centrilobular
emphysema with diffuse bronchial wall thickening. No pleural
effusion. Numerous tree-in-bud nodularity is identified scattered
throughout both lungs which is increased when compared with
04/21/2020 but appears similar to 11/27/2019. More focal area of
consolidation is noted within the anteromedial left upper lobe
measuring 2.3 cm, image 58/7. Areas of subpleural consolidation are
noted within the periphery of the inferior right middle lobe and
lateral right base.

Musculoskeletal: No chest wall abnormality. No acute or significant
osseous findings.

Review of the MIP images confirms the above findings.

CT ABDOMEN and PELVIS FINDINGS

Hepatobiliary: There is no focal liver abnormality. Moderate
intrahepatic bile duct dilatation appears similar to the previous
exam. The common bile duct is increased in caliber measuring 1.1 cm,
image 42/6. Previously this measured the same. No calcified common
bile duct stone identified. Gallbladder appears partially collapsed
with mild wall thickening measuring 4 mm, image [DATE]. No
pericholecystic fluid. No gallstones.

Pancreas: Unremarkable. No pancreatic ductal dilatation or
surrounding inflammatory changes.

Spleen: Normal in size without focal abnormality.

Adrenals/Urinary Tract: Normal appearance of the adrenal glands.
Right kidney appears normal. Small left kidney cysts. No
nephrolithiasis, mass or hydronephrosis. Urinary bladder is
unremarkable for degree of distension.

Stomach/Bowel: Stomach appears normal. Similar appearance of
malrotation deformity. The appendix is visualized and appears
normal. No bowel wall thickening, inflammation, or distension.

Vascular/Lymphatic: No significant vascular findings are present. No
enlarged abdominal or pelvic lymph nodes.

Reproductive: Uterus and bilateral adnexa are unremarkable.

Other: No free fluid or fluid collections

Musculoskeletal: No acute or significant osseous findings.

Review of the MIP images confirms the above findings.
IMPRESSION: 1. No evidence for acute pulmonary embolus.
2. Extensive tree-in-bud nodularity is identified scattered
throughout both lungs. This is increased when compared with
04/21/2020 but appears similar to 11/27/2019. Findings are favored
to represent sequelae of recurrent inflammation or infection such as
panbronchiolitis or atypical infection.
3. Enlarged mediastinal and hilar lymph nodes are identified. In the
acute setting these are likely reactive in etiology. Follow-up
imaging in 3 months with repeat CT of the chest with IV contrast
material is recommended. This follows ACR consensus guidelines:
Managing Incidental Findings on Thoracic CT: Mediastinal and
Cardiovascular Findings. [HOSPITAL] 6389;15:[id number].
4. Stable appearance of intrahepatic and common bile duct
dilatation. No calcified common bile duct stone identified.
5. Mild wall thickening involving the gallbladder is noted which may
reflect incomplete distention. If there are clinical signs or
symptoms worrisome for cholecystitis then right upper quadrant
sonogram would be advised.
6. Emphysema and diffuse bronchial wall thickening with emphysema,
as above; imaging findings suggestive of underlying COPD.
7. Aortic Atherosclerosis (VDN6Z-PNX.X) and Emphysema (VDN6Z-UWW.K).

## 2022-09-22 IMAGING — CT CT ANGIO CHEST
2 of 6 series · 16 of 46 positions shown · IV contrast (Omnipaque or Isovue)
Comparison: 04/21/2020

CLINICAL DATA: Positive D-dimer. Chest pain and shortness of
breath. Rule out pulmonary embolus. Abdominal pain.

EXAM:
CT ANGIOGRAPHY CHEST
CT ABDOMEN AND PELVIS WITH CONTRAST
TECHNIQUE: Multidetector CT imaging of the chest was performed using the
standard protocol during bolus administration of intravenous
contrast. Multiplanar CT image reconstructions and MIPs were
obtained to evaluate the vascular anatomy. Multidetector CT imaging
of the abdomen and pelvis was performed using the standard protocol
during bolus administration of intravenous contrast.
CONTRAST:  100mL OMNIPAQUE IOHEXOL 350 MG/ML SOLN

[Series 6: pe axialthins · axial · 0.74mm/px · z∈[+1031,+1315]mm · 13 of 389 slices shown]
[im 17/389  lung]
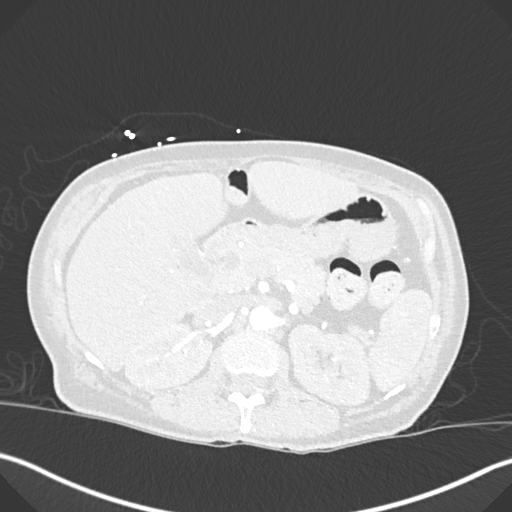
[im 49/389  soft-tissue]
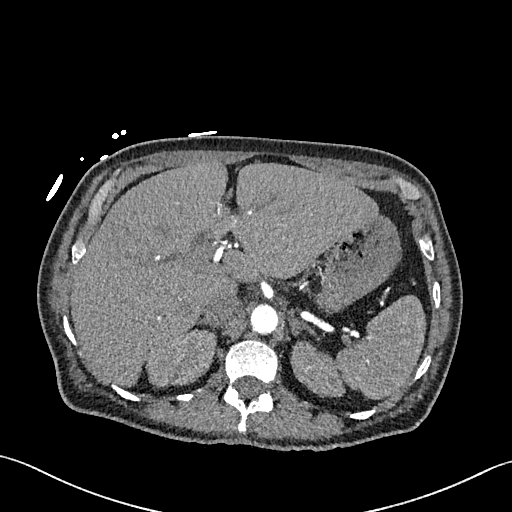
[im 81/389  lung]
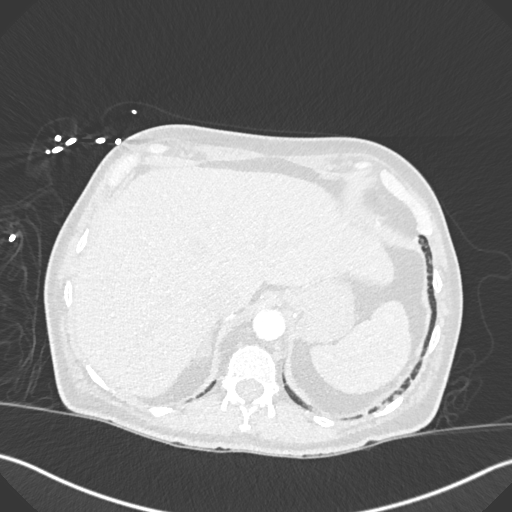
[im 114/389  soft-tissue]
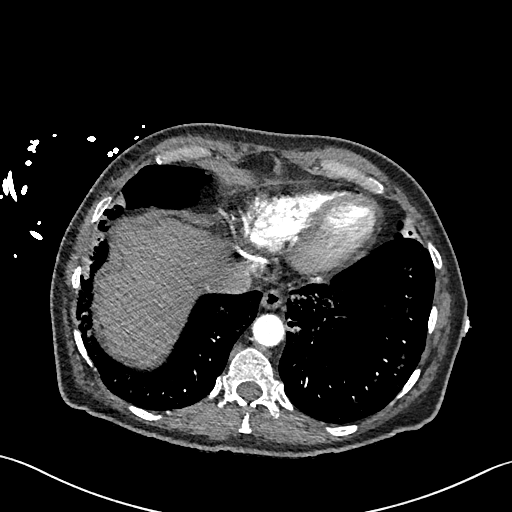
[im 130/389  lung]
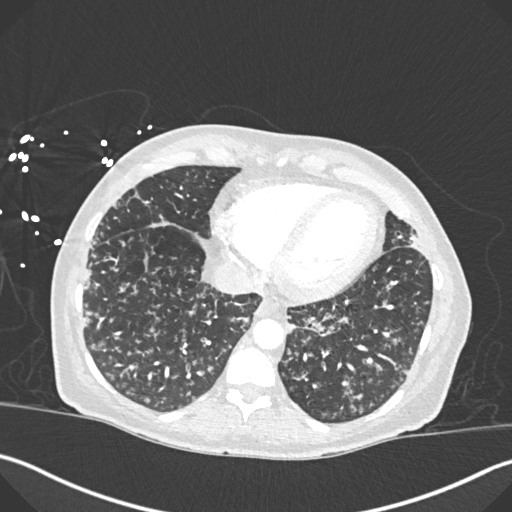
[im 162/389  soft-tissue]
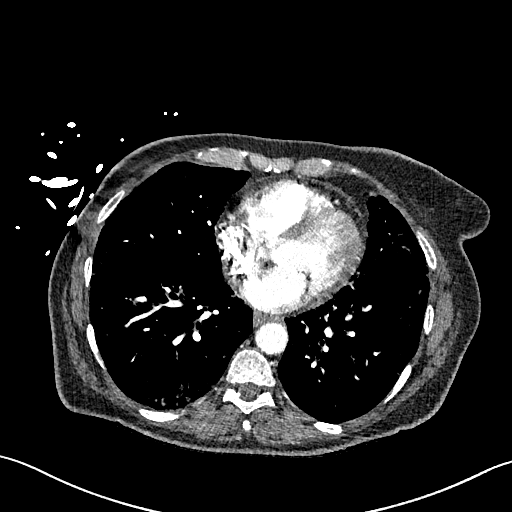
[im 195/389  lung]
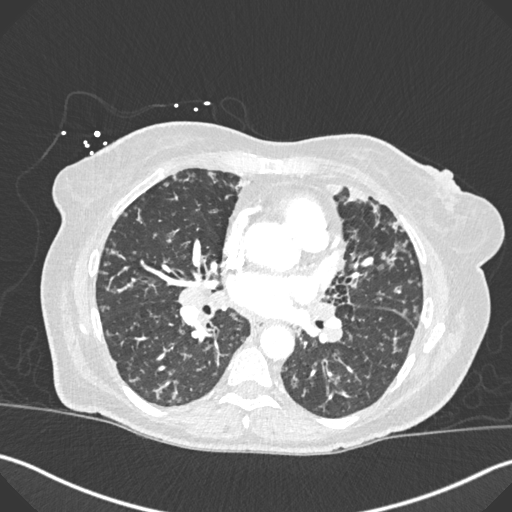
[im 227/389  soft-tissue]
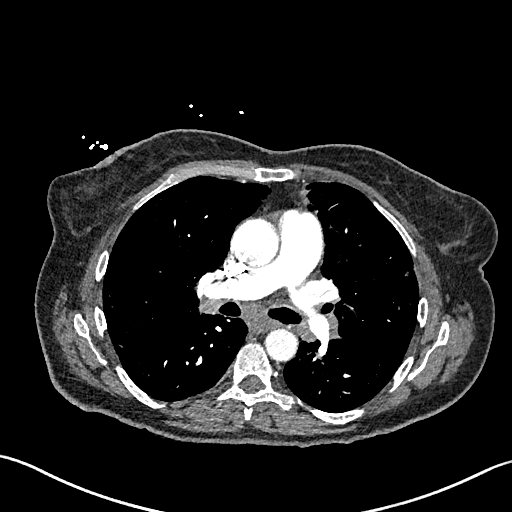
[im 259/389  lung]
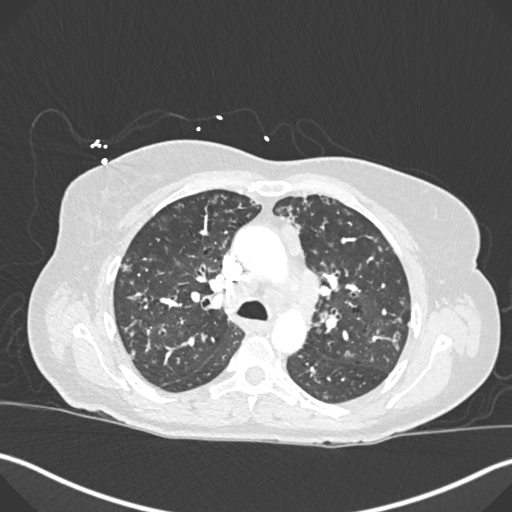
[im 275/389  soft-tissue]
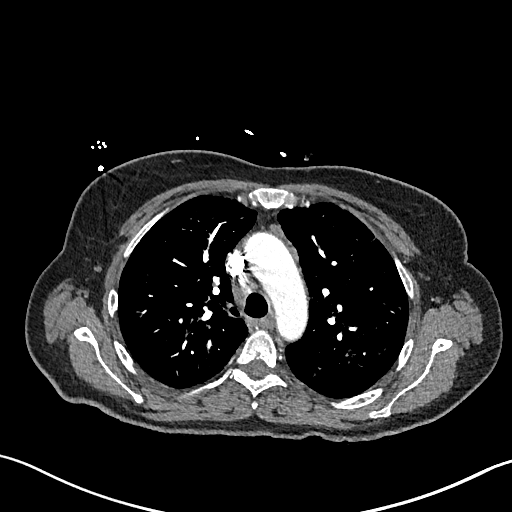
[im 308/389  lung]
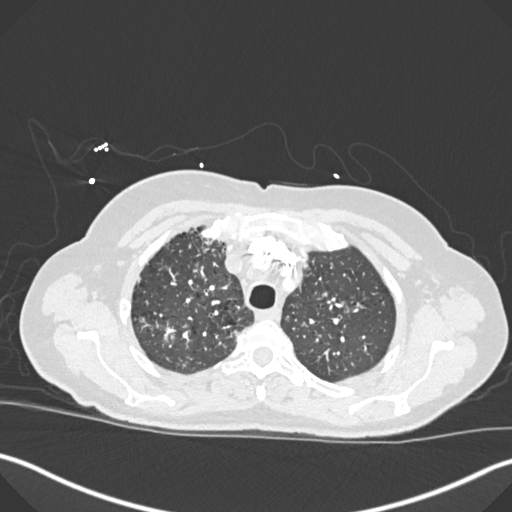
[im 340/389  soft-tissue]
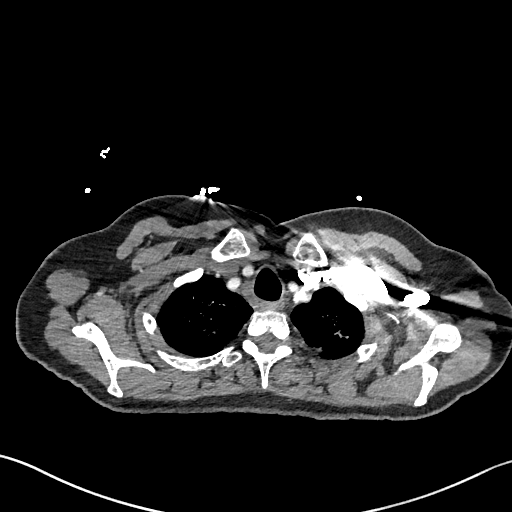
[im 372/389  lung]
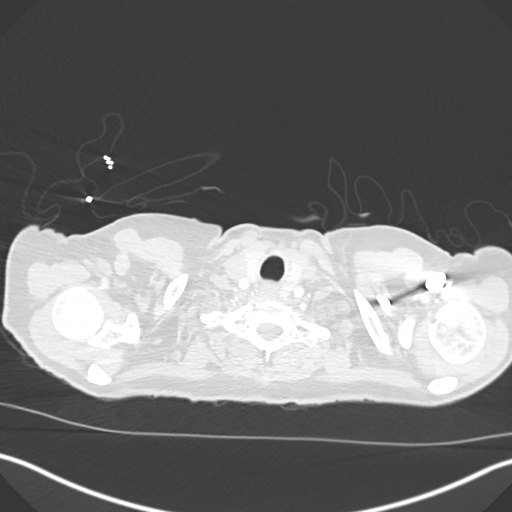

[Series 9: cor soft · coronal · 0.65mm/px · 3 of 151 slices shown]
[im 38/151  soft-tissue]
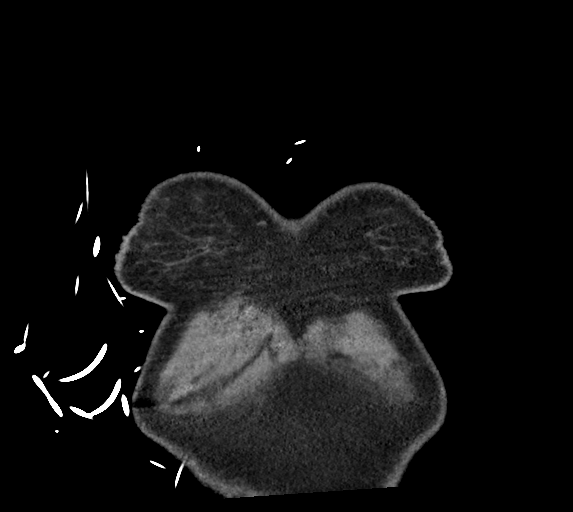
[im 76/151  soft-tissue]
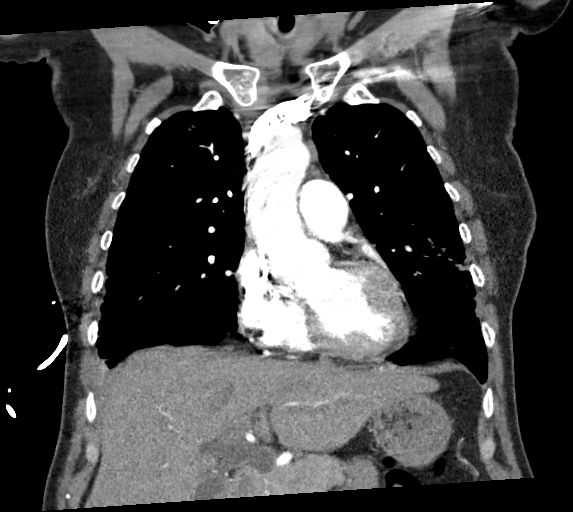
[im 113/151  soft-tissue]
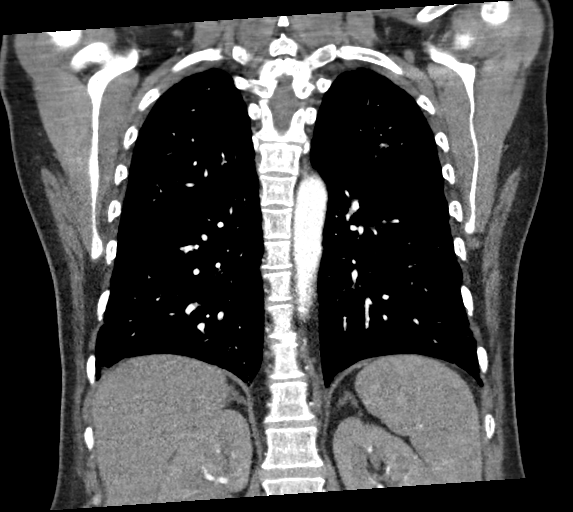

[16 of 46 positions shown; findings below may reference images not displayed]

FINDINGS: CTA CHEST FINDINGS

Cardiovascular: Satisfactory opacification of the pulmonary arteries
to the segmental level. No evidence of pulmonary embolism. Normal
heart size. No pericardial effusion.

Mediastinum/Nodes: Thyroid gland, trachea, and esophagus demonstrate
no significant findings. Mediastinal and hilar adenopathy is noted.
Index right paratracheal lymph node measures 1.5 cm, image 33/5.
Formally 1.1 cm. Subcarinal lymph node measures 1.6 cm, image 47/5.
Previously 1.2 cm. Left hilar lymph node measures 1.4 cm, image
50/5. Previously 0.8 cm. Right hilar lymph node measures 1.6 cm,
image 50/4. Previously 1.2 cm. No supraclavicular or axillary
adenopathy.

Lungs/Pleura: No pleural effusion. Paraseptal and centrilobular
emphysema with diffuse bronchial wall thickening. No pleural
effusion. Numerous tree-in-bud nodularity is identified scattered
throughout both lungs which is increased when compared with
04/21/2020 but appears similar to 11/27/2019. More focal area of
consolidation is noted within the anteromedial left upper lobe
measuring 2.3 cm, image 58/7. Areas of subpleural consolidation are
noted within the periphery of the inferior right middle lobe and
lateral right base.

Musculoskeletal: No chest wall abnormality. No acute or significant
osseous findings.

Review of the MIP images confirms the above findings.

CT ABDOMEN and PELVIS FINDINGS

Hepatobiliary: There is no focal liver abnormality. Moderate
intrahepatic bile duct dilatation appears similar to the previous
exam. The common bile duct is increased in caliber measuring 1.1 cm,
image 42/6. Previously this measured the same. No calcified common
bile duct stone identified. Gallbladder appears partially collapsed
with mild wall thickening measuring 4 mm, image [DATE]. No
pericholecystic fluid. No gallstones.

Pancreas: Unremarkable. No pancreatic ductal dilatation or
surrounding inflammatory changes.

Spleen: Normal in size without focal abnormality.

Adrenals/Urinary Tract: Normal appearance of the adrenal glands.
Right kidney appears normal. Small left kidney cysts. No
nephrolithiasis, mass or hydronephrosis. Urinary bladder is
unremarkable for degree of distension.

Stomach/Bowel: Stomach appears normal. Similar appearance of
malrotation deformity. The appendix is visualized and appears
normal. No bowel wall thickening, inflammation, or distension.

Vascular/Lymphatic: No significant vascular findings are present. No
enlarged abdominal or pelvic lymph nodes.

Reproductive: Uterus and bilateral adnexa are unremarkable.

Other: No free fluid or fluid collections

Musculoskeletal: No acute or significant osseous findings.

Review of the MIP images confirms the above findings.
IMPRESSION: 1. No evidence for acute pulmonary embolus.
2. Extensive tree-in-bud nodularity is identified scattered
throughout both lungs. This is increased when compared with
04/21/2020 but appears similar to 11/27/2019. Findings are favored
to represent sequelae of recurrent inflammation or infection such as
panbronchiolitis or atypical infection.
3. Enlarged mediastinal and hilar lymph nodes are identified. In the
acute setting these are likely reactive in etiology. Follow-up
imaging in 3 months with repeat CT of the chest with IV contrast
material is recommended. This follows ACR consensus guidelines:
Managing Incidental Findings on Thoracic CT: Mediastinal and
Cardiovascular Findings. [HOSPITAL] 6389;15:[id number].
4. Stable appearance of intrahepatic and common bile duct
dilatation. No calcified common bile duct stone identified.
5. Mild wall thickening involving the gallbladder is noted which may
reflect incomplete distention. If there are clinical signs or
symptoms worrisome for cholecystitis then right upper quadrant
sonogram would be advised.
6. Emphysema and diffuse bronchial wall thickening with emphysema,
as above; imaging findings suggestive of underlying COPD.
7. Aortic Atherosclerosis (VDN6Z-PNX.X) and Emphysema (VDN6Z-UWW.K).

## 2022-10-03 ENCOUNTER — Encounter (INDEPENDENT_AMBULATORY_CARE_PROVIDER_SITE_OTHER): Payer: Self-pay | Admitting: Gastroenterology

## 2022-10-07 NOTE — Progress Notes (Deleted)
Office Visit Note  Patient: [name]             Date of Birth: [birth year]           MRN: [id number]             PCP: Kara Pacer, NP Referring: Katherine Basset* Visit Date: 10/13/2022 Occupation: @GUAROCC @  Subjective:  No chief complaint on file.   History of Present Illness: [name] is a 56 y.o. female ***   Activities of Daily Living:  Patient reports morning stiffness for *** {minute/hour:19697}.   Patient {ACTIONS;DENIES/REPORTS:21021675::"Denies"} nocturnal pain.  Difficulty dressing/grooming: {ACTIONS;DENIES/REPORTS:21021675::"Denies"} Difficulty climbing stairs: {ACTIONS;DENIES/REPORTS:21021675::"Denies"} Difficulty getting out of chair: {ACTIONS;DENIES/REPORTS:21021675::"Denies"} Difficulty using hands for taps, buttons, cutlery, and/or writing: {ACTIONS;DENIES/REPORTS:21021675::"Denies"}  No Rheumatology ROS completed.   PMFS History:  Patient Active Problem List   Diagnosis Date Noted   Acute hypercapnic respiratory failure (HCC) 01/22/2022   Abnormal gall bladder diagnostic imaging    Sepsis due to undetermined organism (HCC) 08/17/2021   Chronic respiratory failure with hypoxia and hypercapnia (HCC) 04/23/2020   Hoarseness or changing voice 01/11/2020   Nausea and vomiting 01/11/2020   Acute on chronic respiratory failure with hypoxia and hypercapnia (HCC) 11/27/2019   Chronic pain syndrome 11/27/2019   Lobar pneumonia (HCC) 11/27/2019   Atypical pneumonia - SEPSIS RULED OUT 09/28/2016   Chronic obstructive pulmonary disease (HCC)    Pneumonia 05/30/2016   SIRS (systemic inflammatory response syndrome) (HCC) 10/16/2015   Sepsis (HCC) 10/15/2015   Anxiety state 10/15/2015   Essential hypertension 10/15/2015   Hypokalemia 10/15/2015   COPD with acute exacerbation (HCC) 08/14/2014   Acute-on-chronic respiratory failure (HCC) 03/13/2014   Multifocal Pneumonia 03/11/2014   Community acquired pneumonia 03/11/2014   COPD  exacerbation (HCC) 04/22/2013   GERD (gastroesophageal reflux disease) 12/21/2012   Constipation 12/21/2012   Healthcare-associated pneumonia 02/03/2012   Candida infection 11/26/2011   Viral syndrome 11/21/2011   DM (diabetes mellitus) (HCC) 11/21/2011   Oral candidiasis 07/03/2011   Low back pain 06/16/2011   Muscle spasms of lower extremity 06/16/2011   Cigarette smoker 09/12/2010   MAJOR DPRSV DISORDER RECURRENT EPISODE MODERATE 02/14/2010   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  02/14/2010   UNSPECIFIED NUTRITIONAL DEFICIENCY 02/13/2010   ADULT RESPIRATORY DISTRESS SYNDROME 02/13/2010   DELIRIUM 02/13/2010    Past Medical History:  Diagnosis Date   Angina    Anxiety state 10/15/2015   ARDS (adult respiratory distress syndrome) (HCC)    Jan 2011   Asthma    Chronic back pain    COPD (chronic obstructive pulmonary disease) (HCC)    Diabetes mellitus    GERD (gastroesophageal reflux disease) 12/21/2012   HTN (hypertension) 10/15/2015   Hyperglycemia, drug-induced    steroid induced hyperglycemia   Migraine headache    On home O2    Pneumonia    Recurrent upper respiratory infection (URI)    Shortness of breath     Family History  Problem Relation Age of Onset   Coronary artery disease Brother    Diabetes Other    Cancer Other    Hypertension Other    Past Surgical History:  Procedure Laterality Date   c-section     TRACHEOSTOMY     decannulated 12/2009   TUBAL LIGATION     uterine ablasion     Social History   Social History Narrative   Living with her mom.    Immunization History  Administered Date(s) Administered   Influenza  Split 11/26/2011   Influenza Whole 09/12/2010   Influenza,inj,Quad PF,6+ Mos 08/15/2014, 09/30/2016, 08/19/2021   Influenza-Unspecified 09/24/2015   Pneumococcal Polysaccharide-23 09/12/2010   Pneumococcal-Unspecified 09/24/2015     Objective: Vital Signs: There were no vitals taken for this visit.   Physical Exam    Musculoskeletal Exam: ***  CDAI Exam: CDAI Score: -- Patient Global: --; Provider Global: -- Swollen: --; Tender: -- Joint Exam 10/13/2022   No joint exam has been documented for this visit   There is currently no information documented on the homunculus. Go to the Rheumatology activity and complete the homunculus joint exam.  Investigation: No additional findings.  Imaging: No results found.  Recent Labs: Lab Results  Component Value Date   WBC 13.4 (H) 04/19/2022   HGB 11.5 (L) 04/19/2022   PLT 311 04/19/2022   NA 133 (L) 04/19/2022   K 3.2 (L) 04/19/2022   CL 92 (L) 04/19/2022   CO2 34 (H) 04/19/2022   GLUCOSE 246 (H) 04/19/2022   BUN 6 04/19/2022   CREATININE 0.33 (L) 04/19/2022   BILITOT 0.3 04/19/2022   ALKPHOS 107 04/19/2022   AST 11 (L) 04/19/2022   ALT 9 04/19/2022   PROT 6.8 04/19/2022   ALBUMIN 3.3 (L) 04/19/2022   CALCIUM 8.4 (L) 04/19/2022   GFRAA >60 04/21/2020    Speciality Comments: No specialty comments available.  Procedures:  No procedures performed Allergies: Levofloxacin, Levofloxacin in d5w, Penicillins, Doxycycline, and Morphine   Assessment / Plan:     Visit Diagnoses: Arthritis  Chronic pain syndrome - oxycodone 5 mg, robaxin  Essential hypertension  History of COPD  Chronic respiratory failure with hypoxia and hypercapnia (HCC)  Type 2 diabetes mellitus with hyperglycemia, without long-term current use of insulin (HCC)  History of gastroesophageal reflux (GERD)  History of sepsis  Cigarette smoker  History of anxiety  Orders: No orders of the defined types were placed in this encounter.  No orders of the defined types were placed in this encounter.   Face-to-face time spent with patient was *** minutes. Greater than 50% of time was spent in counseling and coordination of care.  Follow-Up Instructions: No follow-ups on file.   Gearldine Bienenstock, PA-C  Note - This record has been created using Dragon software.   Chart creation errors have been sought, but may not always  have been located. Such creation errors do not reflect on  the standard of medical care.,

## 2022-10-13 ENCOUNTER — Ambulatory Visit: Payer: Medicaid Other | Admitting: Rheumatology

## 2022-10-13 DIAGNOSIS — Z8619 Personal history of other infectious and parasitic diseases: Secondary | ICD-10-CM

## 2022-10-13 DIAGNOSIS — J9611 Chronic respiratory failure with hypoxia: Secondary | ICD-10-CM

## 2022-10-13 DIAGNOSIS — I10 Essential (primary) hypertension: Secondary | ICD-10-CM

## 2022-10-13 DIAGNOSIS — G894 Chronic pain syndrome: Secondary | ICD-10-CM

## 2022-10-13 DIAGNOSIS — M1990 Unspecified osteoarthritis, unspecified site: Secondary | ICD-10-CM

## 2022-10-13 DIAGNOSIS — Z8719 Personal history of other diseases of the digestive system: Secondary | ICD-10-CM

## 2022-10-13 DIAGNOSIS — Z8709 Personal history of other diseases of the respiratory system: Secondary | ICD-10-CM

## 2022-10-13 DIAGNOSIS — Z8659 Personal history of other mental and behavioral disorders: Secondary | ICD-10-CM

## 2022-10-13 DIAGNOSIS — F17210 Nicotine dependence, cigarettes, uncomplicated: Secondary | ICD-10-CM

## 2022-10-13 DIAGNOSIS — E1165 Type 2 diabetes mellitus with hyperglycemia: Secondary | ICD-10-CM

## 2022-10-30 ENCOUNTER — Encounter (INDEPENDENT_AMBULATORY_CARE_PROVIDER_SITE_OTHER): Payer: Medicaid Other | Admitting: Internal Medicine

## 2022-10-30 ENCOUNTER — Ambulatory Visit: Payer: Medicaid Other | Admitting: Rheumatology

## 2022-10-30 NOTE — Progress Notes (Signed)
Erroneous encounter - please disregard.

## 2022-11-04 ENCOUNTER — Encounter: Payer: Self-pay | Admitting: Internal Medicine

## 2022-11-11 IMAGING — DX DG SHOULDER 2+V*R*
3 series · 3 of 3 positions shown · non-contrast
Comparison: None.

CLINICAL DATA: Right shoulder pain

EXAM:
RIGHT SHOULDER - 2+ VIEW

[shoulder grashey]
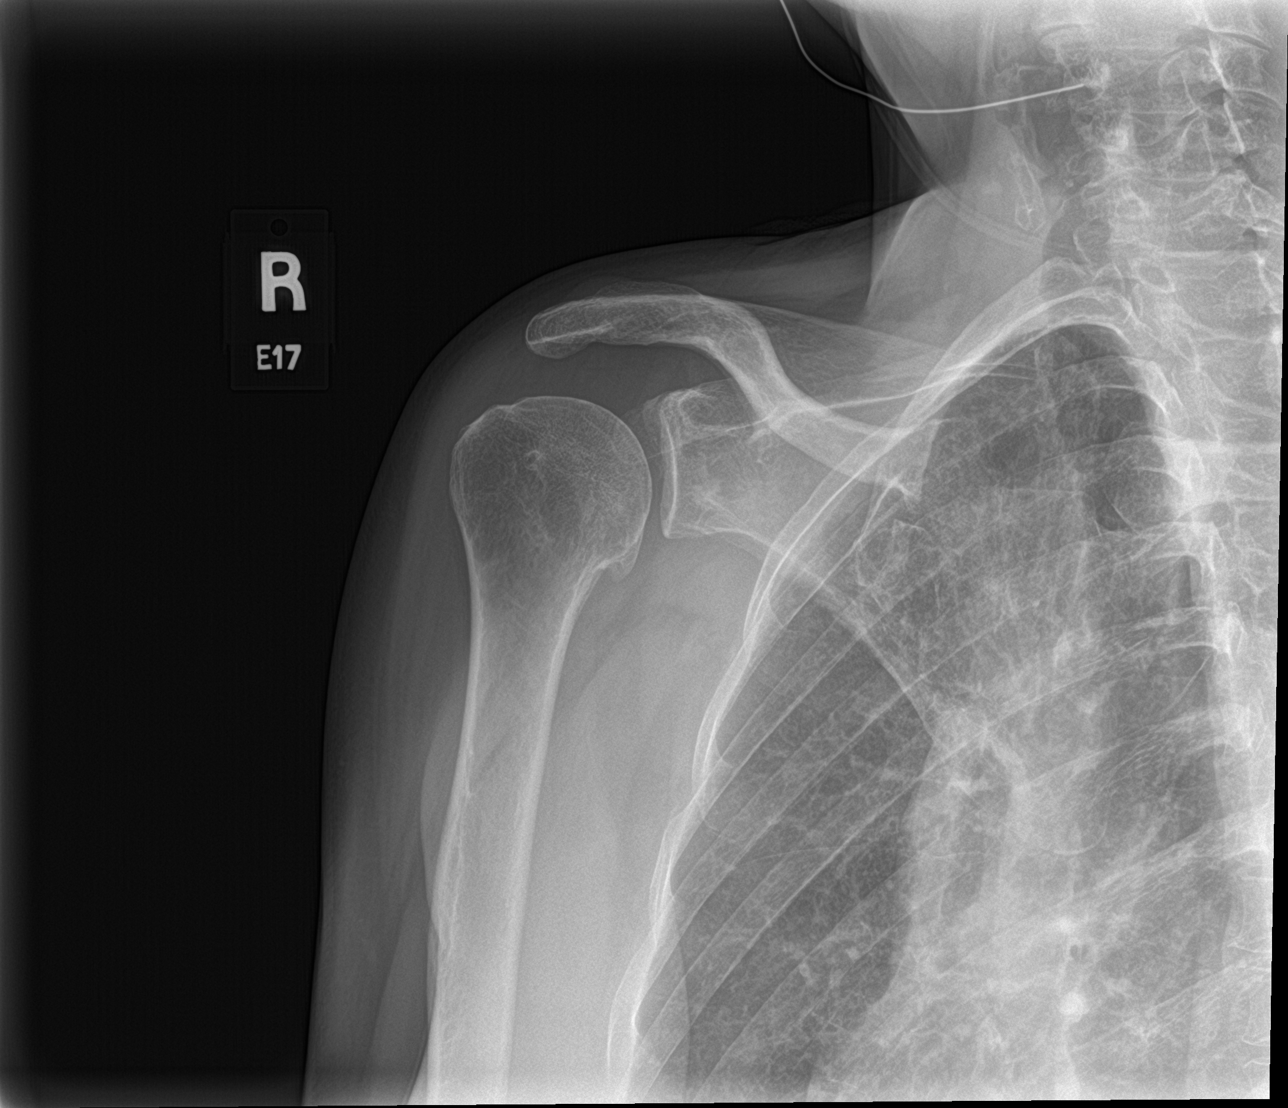

[shoulder y view]
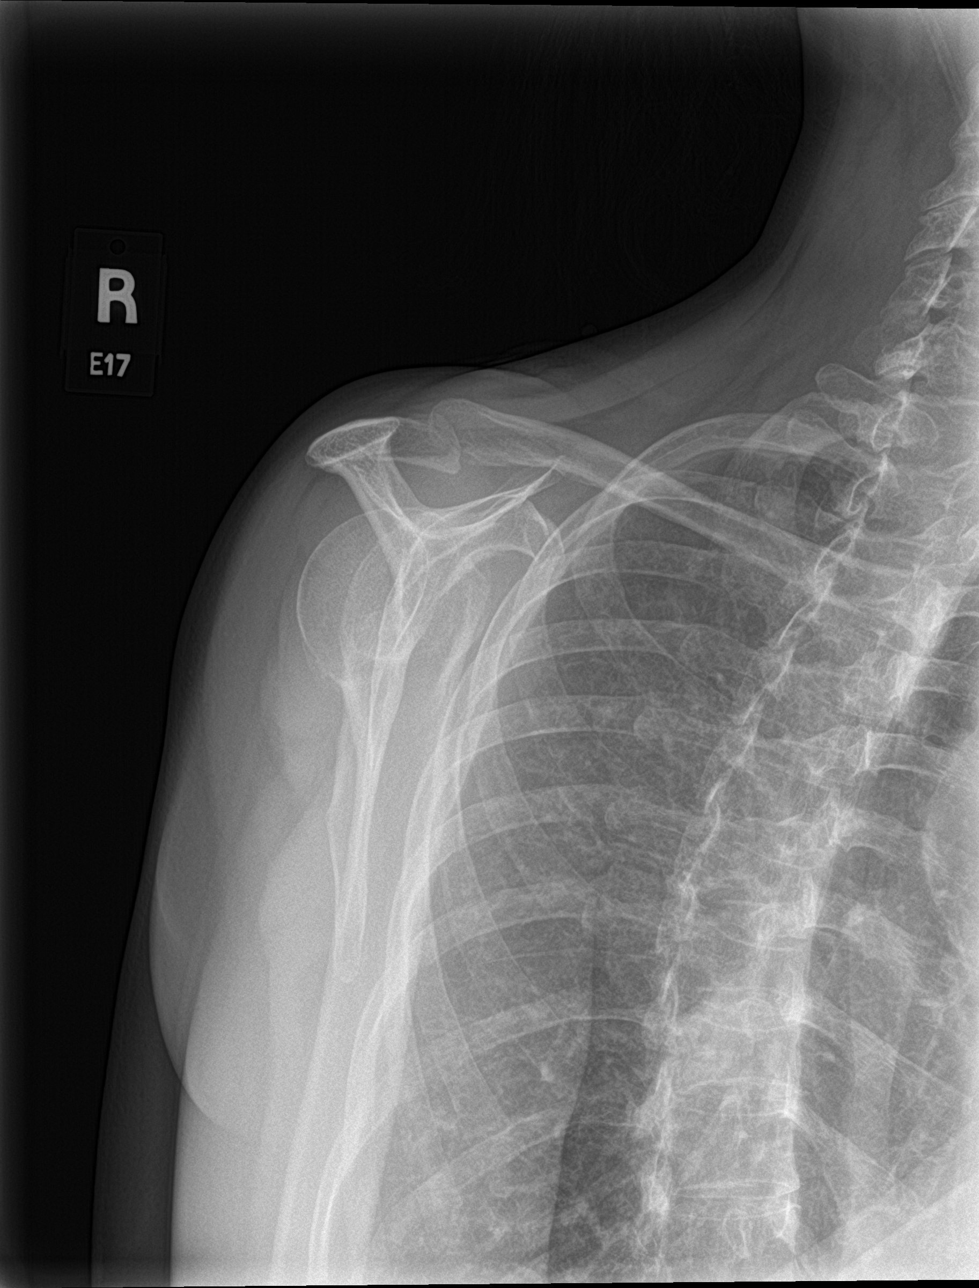

[shoulder axillary]
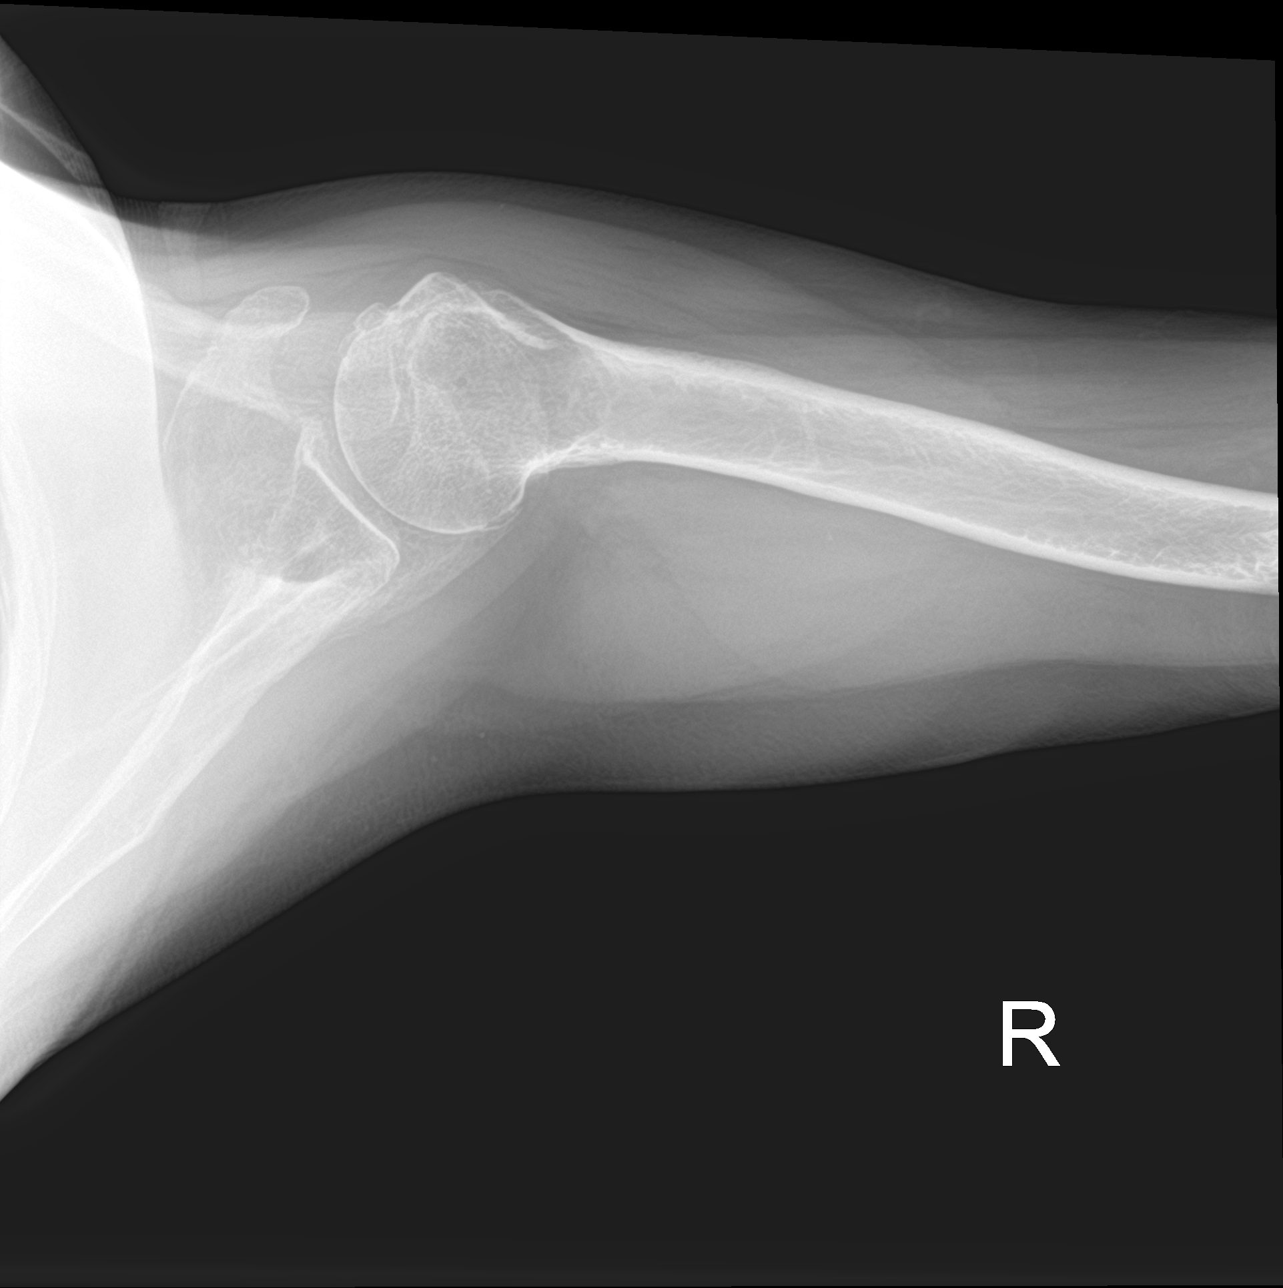

[3 of 3 positions shown; findings below may reference images not displayed]

FINDINGS: Mild glenohumeral degenerative arthritis with osteophyte formation
noted. No acute fracture or dislocation. Acromioclavicular joint
space is not well profiled. Limited evaluation of the right
hemithorax is unremarkable.
IMPRESSION: Mild glenohumeral degenerative arthritis.

## 2022-11-25 ENCOUNTER — Encounter: Payer: Medicaid Other | Admitting: Nurse Practitioner

## 2022-11-25 ENCOUNTER — Encounter: Payer: Self-pay | Admitting: Nurse Practitioner

## 2022-11-26 NOTE — Progress Notes (Signed)
This encounter was created in error - please disregard.

## 2023-01-14 ENCOUNTER — Ambulatory Visit: Payer: Medicaid Other | Admitting: Family

## 2023-02-01 ENCOUNTER — Ambulatory Visit: Payer: Medicaid Other | Admitting: Adult Health

## 2023-02-27 IMAGING — DX DG CHEST 1V PORT
1 series · 1 of 1 positions shown · non-contrast
Comparison: 08/16/2021

CLINICAL DATA: Productive cough.  Hypoxemia.

EXAM:
PORTABLE CHEST 1 VIEW

[chest ap]
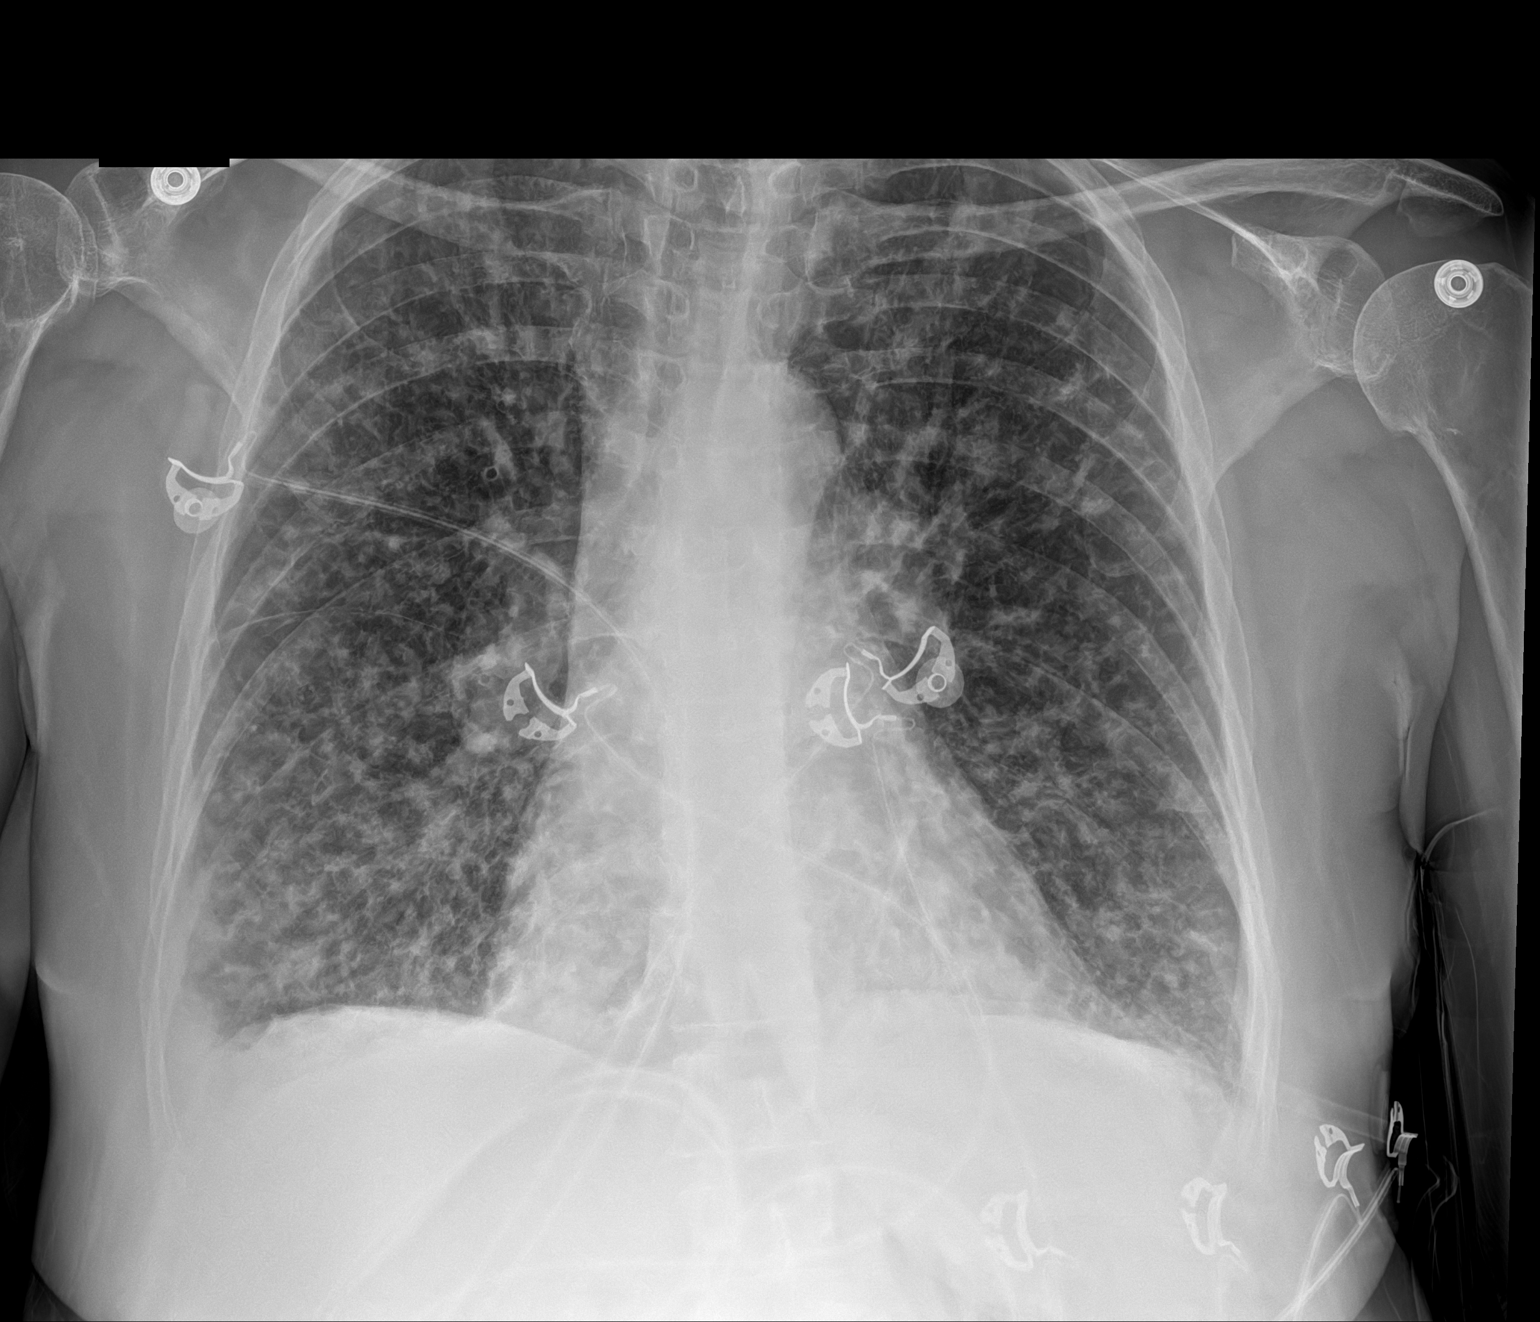

[1 of 1 positions shown; findings below may reference images not displayed]

FINDINGS: Diffuse bilateral nodular airspace disease throughout both lungs
similar to the prior study. No confluent consolidation. Small
bilateral effusions unchanged. Negative for heart failure.
IMPRESSION: No change in bilateral nodular airspace disease which may be due to
infection. Small bilateral effusions.

## 2023-04-15 IMAGING — DX DG CHEST 1V PORT
1 series · 1 of 1 positions shown · non-contrast
Comparison: 01/22/2022, 08/16/2021

CLINICAL DATA: Possible sepsis, shortness of breath

EXAM:
PORTABLE CHEST 1 VIEW

[chest ap]
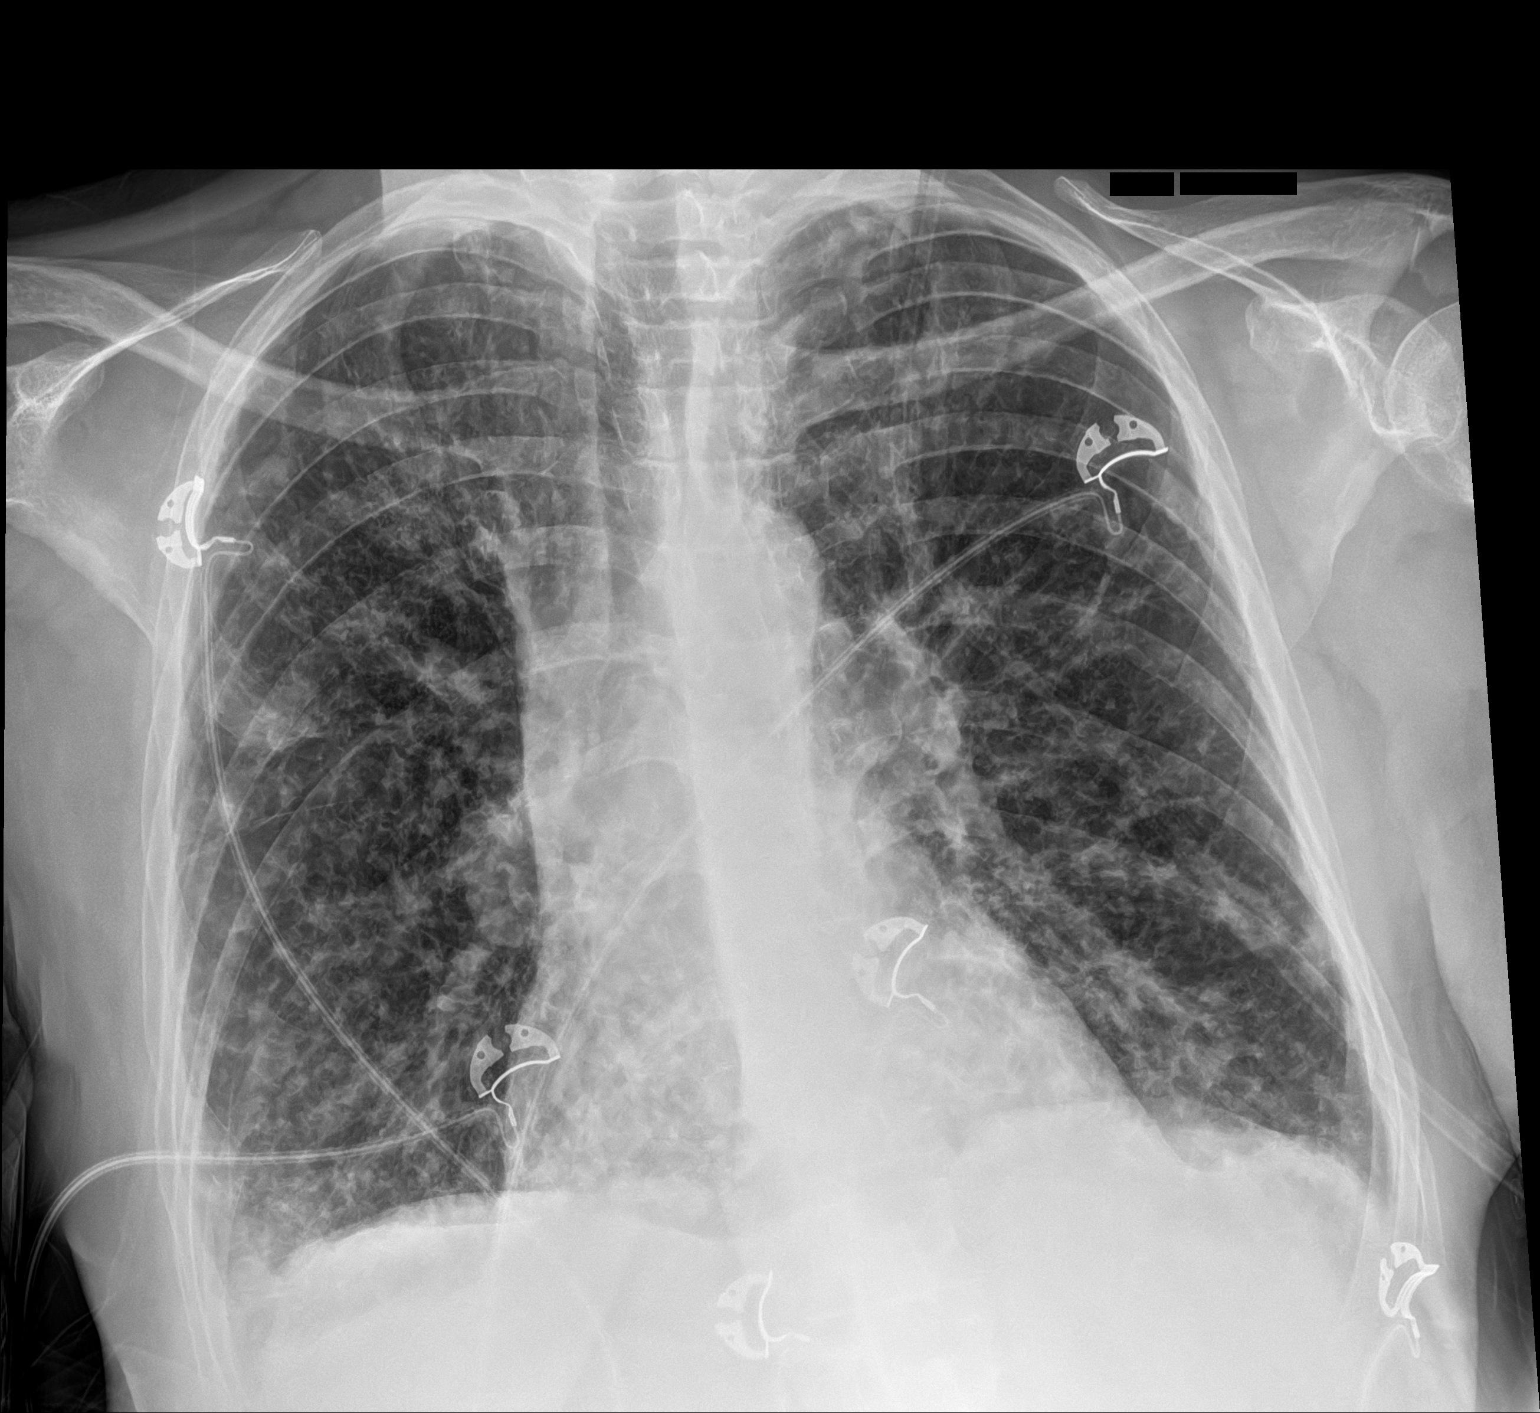

[1 of 1 positions shown; findings below may reference images not displayed]

FINDINGS: The heart size and mediastinal contours are within normal limits.
Widespread reticulonodular opacities throughout both lungs, similar
in appearance to prior. No pleural effusion or pneumothorax. The
visualized skeletal structures are unremarkable.
IMPRESSION: Widespread reticulonodular opacities throughout both lungs, likely
infectious or inflammatory. Overall appearance is similar to prior.

## 2023-04-20 ENCOUNTER — Other Ambulatory Visit: Payer: Self-pay

## 2023-04-20 ENCOUNTER — Encounter (HOSPITAL_COMMUNITY): Payer: Self-pay

## 2023-04-20 ENCOUNTER — Emergency Department (HOSPITAL_COMMUNITY): Payer: Medicaid Other

## 2023-04-20 ENCOUNTER — Inpatient Hospital Stay (HOSPITAL_COMMUNITY)
Admission: EM | Admit: 2023-04-20 | Discharge: 2023-04-23 | DRG: 871 | Disposition: A | Discharge status: Home / Self Care | Payer: Medicaid Other | Attending: Internal Medicine | Admitting: Internal Medicine

## 2023-04-20 DIAGNOSIS — E1165 Type 2 diabetes mellitus with hyperglycemia: Secondary | ICD-10-CM | POA: Diagnosis present

## 2023-04-20 DIAGNOSIS — I10 Essential (primary) hypertension: Secondary | ICD-10-CM | POA: Diagnosis present

## 2023-04-20 DIAGNOSIS — Z885 Allergy status to narcotic agent status: Secondary | ICD-10-CM | POA: Diagnosis not present

## 2023-04-20 DIAGNOSIS — J9622 Acute and chronic respiratory failure with hypercapnia: Secondary | ICD-10-CM | POA: Diagnosis not present

## 2023-04-20 DIAGNOSIS — Z809 Family history of malignant neoplasm, unspecified: Secondary | ICD-10-CM

## 2023-04-20 DIAGNOSIS — J9620 Acute and chronic respiratory failure, unspecified whether with hypoxia or hypercapnia: Secondary | ICD-10-CM | POA: Diagnosis present

## 2023-04-20 DIAGNOSIS — K219 Gastro-esophageal reflux disease without esophagitis: Secondary | ICD-10-CM | POA: Diagnosis present

## 2023-04-20 DIAGNOSIS — E782 Mixed hyperlipidemia: Secondary | ICD-10-CM | POA: Diagnosis present

## 2023-04-20 DIAGNOSIS — J9621 Acute and chronic respiratory failure with hypoxia: Secondary | ICD-10-CM | POA: Diagnosis present

## 2023-04-20 DIAGNOSIS — E876 Hypokalemia: Secondary | ICD-10-CM | POA: Diagnosis present

## 2023-04-20 DIAGNOSIS — G894 Chronic pain syndrome: Secondary | ICD-10-CM | POA: Diagnosis present

## 2023-04-20 DIAGNOSIS — A419 Sepsis, unspecified organism: Principal | ICD-10-CM | POA: Diagnosis present

## 2023-04-20 DIAGNOSIS — Z9981 Dependence on supplemental oxygen: Secondary | ICD-10-CM | POA: Diagnosis not present

## 2023-04-20 DIAGNOSIS — J189 Pneumonia, unspecified organism: Secondary | ICD-10-CM

## 2023-04-20 DIAGNOSIS — F411 Generalized anxiety disorder: Secondary | ICD-10-CM | POA: Diagnosis present

## 2023-04-20 DIAGNOSIS — E869 Volume depletion, unspecified: Secondary | ICD-10-CM | POA: Diagnosis present

## 2023-04-20 DIAGNOSIS — J181 Lobar pneumonia, unspecified organism: Secondary | ICD-10-CM | POA: Diagnosis present

## 2023-04-20 DIAGNOSIS — J440 Chronic obstructive pulmonary disease with acute lower respiratory infection: Secondary | ICD-10-CM | POA: Diagnosis present

## 2023-04-20 DIAGNOSIS — Z880 Allergy status to penicillin: Secondary | ICD-10-CM | POA: Diagnosis not present

## 2023-04-20 DIAGNOSIS — Z881 Allergy status to other antibiotic agents status: Secondary | ICD-10-CM | POA: Diagnosis not present

## 2023-04-20 DIAGNOSIS — Z79899 Other long term (current) drug therapy: Secondary | ICD-10-CM

## 2023-04-20 DIAGNOSIS — R6520 Severe sepsis without septic shock: Secondary | ICD-10-CM | POA: Diagnosis present

## 2023-04-20 DIAGNOSIS — R0789 Other chest pain: Secondary | ICD-10-CM | POA: Diagnosis present

## 2023-04-20 DIAGNOSIS — Z833 Family history of diabetes mellitus: Secondary | ICD-10-CM

## 2023-04-20 DIAGNOSIS — E119 Type 2 diabetes mellitus without complications: Secondary | ICD-10-CM

## 2023-04-20 DIAGNOSIS — Z794 Long term (current) use of insulin: Secondary | ICD-10-CM

## 2023-04-20 DIAGNOSIS — Z1152 Encounter for screening for COVID-19: Secondary | ICD-10-CM | POA: Diagnosis not present

## 2023-04-20 DIAGNOSIS — Z8249 Family history of ischemic heart disease and other diseases of the circulatory system: Secondary | ICD-10-CM

## 2023-04-20 DIAGNOSIS — F17210 Nicotine dependence, cigarettes, uncomplicated: Secondary | ICD-10-CM | POA: Diagnosis present

## 2023-04-20 DIAGNOSIS — E785 Hyperlipidemia, unspecified: Secondary | ICD-10-CM | POA: Insufficient documentation

## 2023-04-20 DIAGNOSIS — J449 Chronic obstructive pulmonary disease, unspecified: Secondary | ICD-10-CM | POA: Diagnosis present

## 2023-04-20 DIAGNOSIS — E871 Hypo-osmolality and hyponatremia: Secondary | ICD-10-CM | POA: Diagnosis present

## 2023-04-20 DIAGNOSIS — J441 Chronic obstructive pulmonary disease with (acute) exacerbation: Principal | ICD-10-CM | POA: Diagnosis present

## 2023-04-20 DIAGNOSIS — Z7984 Long term (current) use of oral hypoglycemic drugs: Secondary | ICD-10-CM

## 2023-04-20 LAB — CBC WITH DIFFERENTIAL/PLATELET
Abs Immature Granulocytes: 0 10*3/uL (ref 0.00–0.07)
Band Neutrophils: 13 %
Basophils Absolute: 0 10*3/uL (ref 0.0–0.1)
Basophils Relative: 0 %
Eosinophils Absolute: 0 10*3/uL (ref 0.0–0.5)
Eosinophils Relative: 0 %
HCT: 40.6 % (ref 36.0–46.0)
Hemoglobin: 13.2 g/dL (ref 12.0–15.0)
Lymphocytes Relative: 6 %
Lymphs Abs: 1.9 10*3/uL (ref 0.7–4.0)
MCH: 26.9 pg (ref 26.0–34.0)
MCHC: 32.5 g/dL (ref 30.0–36.0)
MCV: 82.7 fL (ref 80.0–100.0)
Monocytes Absolute: 1.9 10*3/uL — ABNORMAL HIGH (ref 0.1–1.0)
Monocytes Relative: 6 %
Neutro Abs: 28.3 10*3/uL — ABNORMAL HIGH (ref 1.7–7.7)
Neutrophils Relative %: 75 %
Platelets: 300 10*3/uL (ref 150–400)
RBC: 4.91 MIL/uL (ref 3.87–5.11)
RDW: 13.3 % (ref 11.5–15.5)
WBC Morphology: REACTIVE
WBC: 32.2 10*3/uL — ABNORMAL HIGH (ref 4.0–10.5)
nRBC: 0 % (ref 0.0–0.2)

## 2023-04-20 LAB — TROPONIN I (HIGH SENSITIVITY)
Troponin I (High Sensitivity): 7 ng/L (ref <18)
Troponin I (High Sensitivity): 6 ng/L (ref <18)

## 2023-04-20 LAB — EXPECTORATED SPUTUM ASSESSMENT W GRAM STAIN, RFLX TO RESP C

## 2023-04-20 LAB — BRAIN NATRIURETIC PEPTIDE: B Natriuretic Peptide: 110 pg/mL — ABNORMAL HIGH (ref 0.0–100.0)

## 2023-04-20 LAB — COMPREHENSIVE METABOLIC PANEL
ALT: 10 U/L (ref 0–44)
AST: 14 U/L — ABNORMAL LOW (ref 15–41)
Albumin: 3 g/dL — ABNORMAL LOW (ref 3.5–5.0)
Alkaline Phosphatase: 106 U/L (ref 38–126)
Anion gap: 16 — ABNORMAL HIGH (ref 5–15)
BUN: 27 mg/dL — ABNORMAL HIGH (ref 6–20)
CO2: 27 mmol/L (ref 22–32)
Calcium: 9 mg/dL (ref 8.9–10.3)
Chloride: 85 mmol/L — ABNORMAL LOW (ref 98–111)
Creatinine, Ser: 0.56 mg/dL (ref 0.44–1.00)
GFR, Estimated: >60 mL/min (ref >60)
Glucose, Bld: 247 mg/dL — ABNORMAL HIGH (ref 70–99)
Potassium: 3.1 mmol/L — ABNORMAL LOW (ref 3.5–5.1)
Sodium: 128 mmol/L — ABNORMAL LOW (ref 135–145)
Total Bilirubin: 1 mg/dL (ref 0.3–1.2)
Total Protein: 7.8 g/dL (ref 6.5–8.1)

## 2023-04-20 LAB — CBC
HCT: 37 % (ref 36.0–46.0)
Hemoglobin: 11.9 g/dL — ABNORMAL LOW (ref 12.0–15.0)
MCH: 27.2 pg (ref 26.0–34.0)
MCHC: 32.2 g/dL (ref 30.0–36.0)
MCV: 84.5 fL (ref 80.0–100.0)
Platelets: 274 10*3/uL (ref 150–400)
RBC: 4.38 MIL/uL (ref 3.87–5.11)
RDW: 13.5 % (ref 11.5–15.5)
WBC: 26.3 10*3/uL — ABNORMAL HIGH (ref 4.0–10.5)
nRBC: 0 % (ref 0.0–0.2)

## 2023-04-20 LAB — RESP PANEL BY RT-PCR (RSV, FLU A&B, COVID)  RVPGX2
Influenza A by PCR: NEGATIVE
Influenza B by PCR: NEGATIVE
Resp Syncytial Virus by PCR: NEGATIVE
SARS Coronavirus 2 by RT PCR: NEGATIVE

## 2023-04-20 LAB — HIV ANTIBODY (ROUTINE TESTING W REFLEX): HIV Screen 4th Generation wRfx: NONREACTIVE

## 2023-04-20 LAB — BLOOD GAS, VENOUS
Acid-Base Excess: 10.1 mmol/L — ABNORMAL HIGH (ref 0.0–2.0)
Bicarbonate: 34.3 mmol/L — ABNORMAL HIGH (ref 20.0–28.0)
Drawn by: 51174
O2 Saturation: 98.8 %
Patient temperature: 37.2
pCO2, Ven: 43 mmHg — ABNORMAL LOW (ref 44–60)
pH, Ven: 7.51 — ABNORMAL HIGH (ref 7.25–7.43)
pO2, Ven: 133 mmHg — ABNORMAL HIGH (ref 32–45)

## 2023-04-20 LAB — PROTIME-INR
INR: 1.2 (ref 0.8–1.2)
Prothrombin Time: 15.1 seconds (ref 11.4–15.2)

## 2023-04-20 LAB — LACTIC ACID, PLASMA
Lactic Acid, Venous: 1.1 mmol/L (ref 0.5–1.9)
Lactic Acid, Venous: 1.3 mmol/L (ref 0.5–1.9)

## 2023-04-20 LAB — GLUCOSE, CAPILLARY
Glucose-Capillary: 361 mg/dL — ABNORMAL HIGH (ref 70–99)
Glucose-Capillary: 382 mg/dL — ABNORMAL HIGH (ref 70–99)

## 2023-04-20 LAB — CULTURE, RESPIRATORY W GRAM STAIN

## 2023-04-20 LAB — CREATININE, SERUM
Creatinine, Ser: 0.49 mg/dL (ref 0.44–1.00)
GFR, Estimated: >60 mL/min (ref >60)

## 2023-04-20 LAB — APTT: aPTT: 28 seconds (ref 24–36)

## 2023-04-20 LAB — PHOSPHORUS: Phosphorus: 2.4 mg/dL — ABNORMAL LOW (ref 2.5–4.6)

## 2023-04-20 LAB — MAGNESIUM: Magnesium: 1.7 mg/dL (ref 1.7–2.4)

## 2023-04-20 LAB — MRSA NEXT GEN BY PCR, NASAL: MRSA by PCR Next Gen: DETECTED — AB

## 2023-04-20 LAB — PROCALCITONIN: Procalcitonin: 11.77 ng/mL

## 2023-04-20 LAB — STREP PNEUMONIAE URINARY ANTIGEN: Strep Pneumo Urinary Antigen: NEGATIVE

## 2023-04-20 MED ORDER — ONDANSETRON HCL 4 MG/2ML IJ SOLN
4.0000 mg | Freq: Four times a day (QID) | INTRAMUSCULAR | Status: DC | PRN
  Administered 2023-04-20: 4 mg via INTRAVENOUS
  Filled 2023-04-20: qty 2

## 2023-04-20 MED ORDER — HYDROCORTISONE SOD SUC (PF) 100 MG IJ SOLR
100.0000 mg | Freq: Two times a day (BID) | INTRAMUSCULAR | Status: DC
  Administered 2023-04-20 – 2023-04-21 (×2): 100 mg via INTRAVENOUS
  Filled 2023-04-20 (×2): qty 2

## 2023-04-20 MED ORDER — MAGNESIUM SULFATE 2 GM/50ML IV SOLN
2.0000 g | Freq: Once | INTRAVENOUS | Status: AC
  Administered 2023-04-20: 2 g via INTRAVENOUS
  Filled 2023-04-20: qty 50

## 2023-04-20 MED ORDER — IPRATROPIUM-ALBUTEROL 0.5-2.5 (3) MG/3ML IN SOLN
3.0000 mL | Freq: Once | RESPIRATORY_TRACT | Status: AC
  Administered 2023-04-20: 3 mL via RESPIRATORY_TRACT
  Filled 2023-04-20: qty 3

## 2023-04-20 MED ORDER — OXYCODONE HCL 5 MG PO TABS
5.0000 mg | ORAL_TABLET | ORAL | Status: DC | PRN
  Administered 2023-04-20 – 2023-04-23 (×11): 5 mg via ORAL
  Filled 2023-04-20 (×12): qty 1

## 2023-04-20 MED ORDER — BISACODYL 5 MG PO TBEC: 5.0000 mg | DELAYED_RELEASE_TABLET | Freq: Every day | ORAL | Status: DC | PRN

## 2023-04-20 MED ORDER — ROPINIROLE HCL 1 MG PO TABS
1.0000 mg | ORAL_TABLET | Freq: Every day | ORAL | Status: DC
  Administered 2023-04-20 – 2023-04-22 (×3): 1 mg via ORAL
  Filled 2023-04-20 (×3): qty 1

## 2023-04-20 MED ORDER — INSULIN GLARGINE-YFGN 100 UNIT/ML ~~LOC~~ SOLN
20.0000 [IU] | Freq: Every day | SUBCUTANEOUS | Status: DC
  Filled 2023-04-20: qty 0.2

## 2023-04-20 MED ORDER — INSULIN ASPART 100 UNIT/ML IJ SOLN
0.0000 [IU] | Freq: Three times a day (TID) | INTRAMUSCULAR | Status: DC
  Administered 2023-04-20: 9 [IU] via SUBCUTANEOUS

## 2023-04-20 MED ORDER — LEVALBUTEROL HCL 0.63 MG/3ML IN NEBU
0.6300 mg | INHALATION_SOLUTION | Freq: Four times a day (QID) | RESPIRATORY_TRACT | Status: DC
  Administered 2023-04-20 – 2023-04-23 (×11): 0.63 mg via RESPIRATORY_TRACT
  Filled 2023-04-20 (×12): qty 3

## 2023-04-20 MED ORDER — SODIUM CHLORIDE 0.9% FLUSH: 3.0000 mL | INTRAVENOUS | Status: DC | PRN

## 2023-04-20 MED ORDER — FENTANYL CITRATE PF 50 MCG/ML IJ SOSY
50.0000 ug | PREFILLED_SYRINGE | Freq: Once | INTRAMUSCULAR | Status: AC
  Administered 2023-04-20: 50 ug via INTRAVENOUS
  Filled 2023-04-20: qty 1

## 2023-04-20 MED ORDER — CHLORHEXIDINE GLUCONATE CLOTH 2 % EX PADS
6.0000 | MEDICATED_PAD | Freq: Every day | CUTANEOUS | Status: DC
  Administered 2023-04-21 – 2023-04-23 (×3): 6 via TOPICAL

## 2023-04-20 MED ORDER — INSULIN GLARGINE-YFGN 100 UNIT/ML ~~LOC~~ SOLN
25.0000 [IU] | Freq: Every day | SUBCUTANEOUS | Status: DC
  Administered 2023-04-20 – 2023-04-21 (×2): 25 [IU] via SUBCUTANEOUS
  Filled 2023-04-20 (×3): qty 0.25

## 2023-04-20 MED ORDER — ASPIRIN 81 MG PO CHEW
324.0000 mg | CHEWABLE_TABLET | Freq: Once | ORAL | Status: AC
  Administered 2023-04-20: 324 mg via ORAL
  Filled 2023-04-20: qty 4

## 2023-04-20 MED ORDER — GLIPIZIDE 5 MG PO TABS
10.0000 mg | ORAL_TABLET | Freq: Every day | ORAL | Status: DC
  Administered 2023-04-21: 10 mg via ORAL
  Filled 2023-04-20: qty 2

## 2023-04-20 MED ORDER — LORATADINE 10 MG PO TABS
10.0000 mg | ORAL_TABLET | Freq: Every day | ORAL | Status: DC
  Administered 2023-04-21 – 2023-04-23 (×3): 10 mg via ORAL
  Filled 2023-04-20 (×3): qty 1

## 2023-04-20 MED ORDER — METHYLPREDNISOLONE SODIUM SUCC 125 MG IJ SOLR
125.0000 mg | Freq: Once | INTRAMUSCULAR | Status: AC
  Administered 2023-04-20: 125 mg via INTRAVENOUS
  Filled 2023-04-20: qty 2

## 2023-04-20 MED ORDER — SODIUM CHLORIDE 0.9 % IV SOLN
1.0000 g | Freq: Once | INTRAVENOUS | Status: AC
  Administered 2023-04-20: 1 g via INTRAVENOUS
  Filled 2023-04-20: qty 10

## 2023-04-20 MED ORDER — ONDANSETRON HCL 4 MG PO TABS
4.0000 mg | ORAL_TABLET | Freq: Four times a day (QID) | ORAL | Status: DC | PRN
  Administered 2023-04-21: 4 mg via ORAL
  Filled 2023-04-20: qty 1

## 2023-04-20 MED ORDER — LACTATED RINGERS IV SOLN
150.0000 mL/h | INTRAVENOUS | Status: DC
  Administered 2023-04-20: 150 mL/h via INTRAVENOUS

## 2023-04-20 MED ORDER — MUPIROCIN 2 % EX OINT
1.0000 | TOPICAL_OINTMENT | Freq: Two times a day (BID) | CUTANEOUS | Status: DC
  Administered 2023-04-20 – 2023-04-23 (×6): 1 via NASAL
  Filled 2023-04-20 (×2): qty 22

## 2023-04-20 MED ORDER — SODIUM CHLORIDE 0.9 % IV SOLN
500.0000 mg | Freq: Once | INTRAVENOUS | Status: AC
  Administered 2023-04-20: 500 mg via INTRAVENOUS
  Filled 2023-04-20: qty 5

## 2023-04-20 MED ORDER — SODIUM CHLORIDE 0.9 % IV SOLN: INTRAVENOUS | Status: DC

## 2023-04-20 MED ORDER — HYDRALAZINE HCL 20 MG/ML IJ SOLN: 10.0000 mg | INTRAMUSCULAR | Status: DC | PRN

## 2023-04-20 MED ORDER — ROSUVASTATIN CALCIUM 20 MG PO TABS
20.0000 mg | ORAL_TABLET | Freq: Every day | ORAL | Status: DC
  Administered 2023-04-20 – 2023-04-23 (×4): 20 mg via ORAL
  Filled 2023-04-20 (×4): qty 1

## 2023-04-20 MED ORDER — TRAZODONE HCL 50 MG PO TABS
50.0000 mg | ORAL_TABLET | Freq: Every evening | ORAL | Status: DC | PRN
  Administered 2023-04-20 – 2023-04-22 (×3): 50 mg via ORAL
  Filled 2023-04-20 (×3): qty 1

## 2023-04-20 MED ORDER — ONDANSETRON HCL 4 MG/2ML IJ SOLN
4.0000 mg | Freq: Once | INTRAMUSCULAR | Status: AC
  Administered 2023-04-20: 4 mg via INTRAVENOUS
  Filled 2023-04-20: qty 2

## 2023-04-20 MED ORDER — LEVALBUTEROL HCL 0.63 MG/3ML IN NEBU
0.6300 mg | INHALATION_SOLUTION | Freq: Four times a day (QID) | RESPIRATORY_TRACT | Status: DC | PRN
Start: 2023-04-20 — End: 2023-04-20

## 2023-04-20 MED ORDER — TRAZODONE HCL 50 MG PO TABS
25.0000 mg | ORAL_TABLET | Freq: Every evening | ORAL | Status: DC | PRN
Start: 2023-04-20 — End: 2023-04-20

## 2023-04-20 MED ORDER — MAGNESIUM SULFATE 2 GM/50ML IV SOLN
INTRAVENOUS | Status: AC
  Administered 2023-04-20: 2 g
  Filled 2023-04-20: qty 50

## 2023-04-20 MED ORDER — SODIUM CHLORIDE 0.9 % IV SOLN
2.0000 g | INTRAVENOUS | Status: DC
  Administered 2023-04-21 – 2023-04-22 (×2): 2 g via INTRAVENOUS
  Filled 2023-04-20 (×3): qty 20

## 2023-04-20 MED ORDER — IRBESARTAN 150 MG PO TABS
150.0000 mg | ORAL_TABLET | Freq: Every day | ORAL | Status: DC
  Administered 2023-04-20 – 2023-04-21 (×2): 150 mg via ORAL
  Filled 2023-04-20 (×2): qty 1

## 2023-04-20 MED ORDER — ACETAMINOPHEN 650 MG RE SUPP: 650.0000 mg | Freq: Four times a day (QID) | RECTAL | Status: DC | PRN

## 2023-04-20 MED ORDER — SUMATRIPTAN SUCCINATE 50 MG PO TABS: 50.0000 mg | ORAL_TABLET | ORAL | Status: DC | PRN

## 2023-04-20 MED ORDER — SODIUM CHLORIDE 0.9 % IV SOLN
500.0000 mg | INTRAVENOUS | Status: DC
  Administered 2023-04-21 – 2023-04-22 (×2): 500 mg via INTRAVENOUS
  Filled 2023-04-20 (×3): qty 5

## 2023-04-20 MED ORDER — HYDROMORPHONE HCL 1 MG/ML IJ SOLN
0.5000 mg | INTRAMUSCULAR | Status: DC | PRN
  Administered 2023-04-20 – 2023-04-21 (×2): 1 mg via INTRAVENOUS
  Filled 2023-04-20 (×2): qty 1

## 2023-04-20 MED ORDER — LEVALBUTEROL HCL 0.63 MG/3ML IN NEBU: 0.6300 mg | INHALATION_SOLUTION | Freq: Four times a day (QID) | RESPIRATORY_TRACT | Status: DC

## 2023-04-20 MED ORDER — FLEET ENEMA 7-19 GM/118ML RE ENEM: 1.0000 | ENEMA | Freq: Once | RECTAL | Status: DC | PRN

## 2023-04-20 MED ORDER — RISAQUAD PO CAPS
1.0000 | ORAL_CAPSULE | Freq: Every day | ORAL | Status: DC
  Administered 2023-04-20 – 2023-04-21 (×2): 1 via ORAL
  Filled 2023-04-20 (×2): qty 1

## 2023-04-20 MED ORDER — SODIUM CHLORIDE 0.9% FLUSH: 3.0000 mL | Freq: Two times a day (BID) | INTRAVENOUS | Status: DC

## 2023-04-20 MED ORDER — IPRATROPIUM BROMIDE 0.02 % IN SOLN
0.5000 mg | Freq: Four times a day (QID) | RESPIRATORY_TRACT | Status: DC
  Administered 2023-04-20 – 2023-04-23 (×11): 0.5 mg via RESPIRATORY_TRACT
  Filled 2023-04-20 (×12): qty 2.5

## 2023-04-20 MED ORDER — CHLORHEXIDINE GLUCONATE CLOTH 2 % EX PADS
6.0000 | MEDICATED_PAD | Freq: Every day | CUTANEOUS | Status: DC
  Administered 2023-04-20 – 2023-04-23 (×3): 6 via TOPICAL

## 2023-04-20 MED ORDER — HEPARIN SODIUM (PORCINE) 5000 UNIT/ML IJ SOLN
5000.0000 [IU] | Freq: Three times a day (TID) | INTRAMUSCULAR | Status: DC
  Administered 2023-04-20 – 2023-04-23 (×9): 5000 [IU] via SUBCUTANEOUS
  Filled 2023-04-20 (×9): qty 1

## 2023-04-20 MED ORDER — IPRATROPIUM BROMIDE 0.02 % IN SOLN
0.5000 mg | Freq: Four times a day (QID) | RESPIRATORY_TRACT | Status: DC | PRN
Start: 2023-04-20 — End: 2023-04-20

## 2023-04-20 MED ORDER — ACETAMINOPHEN 325 MG PO TABS
650.0000 mg | ORAL_TABLET | Freq: Four times a day (QID) | ORAL | Status: DC | PRN
  Administered 2023-04-21 – 2023-04-22 (×4): 650 mg via ORAL
  Filled 2023-04-20 (×4): qty 2

## 2023-04-20 MED ORDER — LACTATED RINGERS IV BOLUS
500.0000 mL | Freq: Once | INTRAVENOUS | Status: AC
  Administered 2023-04-20: 500 mL via INTRAVENOUS

## 2023-04-20 MED ORDER — IOHEXOL 350 MG/ML SOLN
75.0000 mL | Freq: Once | INTRAVENOUS | Status: AC | PRN
  Administered 2023-04-20: 75 mL via INTRAVENOUS

## 2023-04-20 MED ORDER — PANTOPRAZOLE SODIUM 40 MG PO TBEC
40.0000 mg | DELAYED_RELEASE_TABLET | Freq: Every day | ORAL | Status: DC
  Administered 2023-04-20 – 2023-04-23 (×4): 40 mg via ORAL
  Filled 2023-04-20 (×4): qty 1

## 2023-04-20 MED ORDER — SENNOSIDES-DOCUSATE SODIUM 8.6-50 MG PO TABS
1.0000 | ORAL_TABLET | Freq: Every evening | ORAL | Status: DC | PRN
  Administered 2023-04-22: 1 via ORAL
  Filled 2023-04-20: qty 1

## 2023-04-20 NOTE — Hospital Course (Signed)
[name] is a 57 year old female with a chronic respiratory failure, COPD dependent on 3 L, chronic tobacco abuse, DM2,  HTN, HLD,  Presenting today with acute on chronic respiratory failure, initially patient was on CPAP then BiPAP, now in ED has been weaned down to 4 L of oxygen  Patient reports worsening shortness of breath since yesterday, last night with left-sided chest pain, congestion, cough productive of yellowish sputum production.  Felt warm had some chills.  EMS evaluation on the patient hypoxic satting 86% on 3 L of oxygen, she was placed on CPAP, 1 sublingual nitroglycerin was given.  ED course/evaluation: Blood pressure 111/81, pulse (!) 108, temperature 97.9 F (36.6 C), temperature source Oral, RR  17, SpO2 96 %.  CBC: WBC 32.2, neutrophils of 28.3, CMP: Sodium 128, potassium 3.8, chloride 85, glucose 247, BUN 27, albumin 3.0, anion gap 16, AST 14, BNP 110, troponin 7, 6  CTA: iMPRESSION: 1. No segmental or larger pulmonary embolus. 2. Innumerable multifocal basilar-predominant tree-in-bud nodularities, most likely consistent with chronic inflammatory change. 3. Mediastinal and hilar adenopathy, unchanged since 03/2022. 4. Chronic T7 mild superior endplate compression deformity. Additional incidental, chronic and senescent findings as above.  ABG    Component Value Date/Time   PHART 7.314 (L) 11/27/2019 1205   PCO2ART 85.8 (HH) 11/27/2019 1205   PO2ART 88.5 11/27/2019 1205   HCO3 34.3 (H) 04/20/2023 1024   TCO2 75.0 05/30/2016 1500   O2SAT 98.8 04/20/2023 1024   SARS-CoV-2 negative, influenza A/B negative    -Patient was treated with DuoNeb bronchodilators, IV fluids, IV antibiotics, was placed on BiPAP.[name]  Has been weaned off to O2 by nasal cannula  Patient met SIRS/sepsis criteria -IV Solu-Medrol given, started on IV antibiotics of azithromycin and Rocephin

## 2023-04-20 NOTE — ED Notes (Signed)
ED TO INPATIENT HANDOFF REPORT  ED Nurse Name and Phone #: Delice Bison, RN  S Name/Age/Gender [name] 56 y.o. female Room/Bed: APA03/APA03  Code Status   Code Status: Full Code  Home/SNF/Other Home Patient oriented to: self, place, time, and situation Is this baseline? Yes   Triage Complete: Triage complete  Chief Complaint Acute on chronic respiratory failure [facility]) [J96.20]  Triage Note [facility] reports pt coming from home c/o sob for the past few days. Upon EMS arrival pt pulse ox 86% on 3L . EMS placed pt on CPAP and pulse ox 91%. Advised crackles on assessment. Given 1 nitro enroute. Pt has had a cold for the past few days as well.   Allergies Allergies  Allergen Reactions   Levofloxacin Anaphylaxis   Levofloxacin In D5w Itching, Swelling and Rash    IV only IV only   Penicillins Other (See Comments)    convulsions   Doxycycline Nausea And Vomiting   Morphine Nausea Only    Level of Care/Admitting Diagnosis ED Disposition     ED Disposition  Admit   Condition  --   Comment  Hospital Area: [facility] [[id number]]  Level of Care: Stepdown [14]  Covid Evaluation: Asymptomatic - no recent exposure (last 10 days) testing not required  Diagnosis: Acute on chronic respiratory failure [facility]) [[id number]]  Admitting Physician: Felipa Furnace  Attending Physician: Kendell Bane [phone]  Certification:: I certify this patient will need inpatient services for at least 2 midnights  Estimated Length of Stay: 3          B Medical/Surgery History Past Medical History:  Diagnosis Date   Angina    Anxiety state 10/15/2015   ARDS (adult respiratory distress syndrome) (HCC)    Jan 2011   Asthma    Chronic back pain    COPD (chronic obstructive pulmonary disease) (HCC)    Diabetes mellitus    GERD (gastroesophageal reflux disease) 12/21/2012   HTN (hypertension) 10/15/2015   Hyperglycemia, drug-induced    steroid induced hyperglycemia    Migraine headache    On home O2    Pneumonia    Recurrent upper respiratory infection (URI)    Shortness of breath    Past Surgical History:  Procedure Laterality Date   c-section     TRACHEOSTOMY     decannulated 12/2009   TUBAL LIGATION     uterine ablasion       A IV Location/Drains/Wounds Patient Lines/Drains/Airways Status     Active Line/Drains/Airways     Name Placement date Placement time Site Days   Peripheral IV 04/20/23 20 G 1" Anterior;Left Forearm 04/20/23  1345  Forearm  less than 1            Intake/Output Last 24 hours No intake or output data in the 24 hours ending 04/20/23 1452  Labs/Imaging Results for orders placed or performed during the hospital encounter of 04/20/23 (from the past 48 hour(s))  Resp panel by RT-PCR (RSV, Flu A&B, Covid) Anterior Nasal Swab     Status: None   Collection Time: 04/20/23  9:34 AM   Specimen: Anterior Nasal Swab  Result Value Ref Range   SARS Coronavirus 2 by RT PCR NEGATIVE NEGATIVE    Comment: (NOTE) SARS-CoV-2 target nucleic acids are NOT DETECTED.  The SARS-CoV-2 RNA is generally detectable in upper respiratory specimens during the acute phase of infection. The lowest concentration of SARS-CoV-2 viral copies this assay can detect is 138 copies/mL. A negative result  does not preclude SARS-Cov-2 infection and should not be used as the sole basis for treatment or other patient management decisions. A negative result may occur with  improper specimen collection/handling, submission of specimen other than nasopharyngeal swab, presence of viral mutation(s) within the areas targeted by this assay, and inadequate number of viral copies(<138 copies/mL). A negative result must be combined with clinical observations, patient history, and epidemiological information. The expected result is Negative.  Fact Sheet for Patients:  BloggerCourse.com  Fact Sheet for Healthcare Providers:   SeriousBroker.it  This test is no t yet approved or cleared by the [place] FDA and  has been authorized for detection and/or diagnosis of SARS-CoV-2 by FDA under an Emergency Use Authorization (EUA). This EUA will remain  in effect (meaning this test can be used) for the duration of the COVID-19 declaration under Section 564(b)(1) of the Act, 21 U.S.C.section 360bbb-3(b)(1), unless the authorization is terminated  or revoked sooner.       Influenza A by PCR NEGATIVE NEGATIVE   Influenza B by PCR NEGATIVE NEGATIVE    Comment: (NOTE) The Xpert Xpress SARS-CoV-2/FLU/RSV plus assay is intended as an aid in the diagnosis of influenza from Nasopharyngeal swab specimens and should not be used as a sole basis for treatment. Nasal washings and aspirates are unacceptable for Xpert Xpress SARS-CoV-2/FLU/RSV testing.  Fact Sheet for Patients: BloggerCourse.com  Fact Sheet for Healthcare Providers: SeriousBroker.it  This test is not yet approved or cleared by the [place] FDA and has been authorized for detection and/or diagnosis of SARS-CoV-2 by FDA under an Emergency Use Authorization (EUA). This EUA will remain in effect (meaning this test can be used) for the duration of the COVID-19 declaration under Section 564(b)(1) of the Act, 21 U.S.C. section 360bbb-3(b)(1), unless the authorization is terminated or revoked.     Resp Syncytial Virus by PCR NEGATIVE NEGATIVE    Comment: (NOTE) Fact Sheet for Patients: BloggerCourse.com  Fact Sheet for Healthcare Providers: SeriousBroker.it  This test is not yet approved or cleared by the [place] FDA and has been authorized for detection and/or diagnosis of SARS-CoV-2 by FDA under an Emergency Use Authorization (EUA). This EUA will remain in effect (meaning this test can be used) for the duration of  the COVID-19 declaration under Section 564(b)(1) of the Act, 21 U.S.C. section 360bbb-3(b)(1), unless the authorization is terminated or revoked.  Performed at [facility], [address], [place], [place] [address]   Comprehensive metabolic panel     Status: Abnormal   Collection Time: 04/20/23  9:40 AM  Result Value Ref Range   Sodium 128 (L) 135 - 145 mmol/L   Potassium 3.1 (L) 3.5 - 5.1 mmol/L   Chloride 85 (L) 98 - 111 mmol/L   CO2 27 22 - 32 mmol/L   Glucose, Bld 247 (H) 70 - 99 mg/dL    Comment: Glucose reference range applies only to samples taken after fasting for at least 8 hours.   BUN 27 (H) 6 - 20 mg/dL   Creatinine, Ser 0.86 0.44 - 1.00 mg/dL   Calcium 9.0 8.9 - 57.8 mg/dL   Total Protein 7.8 6.5 - 8.1 g/dL   Albumin 3.0 (L) 3.5 - 5.0 g/dL   AST 14 (L) 15 - 41 U/L   ALT 10 0 - 44 U/L   Alkaline Phosphatase 106 38 - 126 U/L   Total Bilirubin 1.0 0.3 - 1.2 mg/dL   GFR, Estimated >46 >96 mL/min    Comment: (NOTE) Calculated  using the CKD-EPI Creatinine Equation (2021)    Anion gap 16 (H) 5 - 15    Comment: Performed at [facility], [address] [place], [place] [address]  Brain natriuretic peptide     Status: Abnormal   Collection Time: 04/20/23  9:40 AM  Result Value Ref Range   B Natriuretic Peptide 110.0 (H) 0.0 - 100.0 pg/mL    Comment: Performed at [facility], [address], [place], [place] [address]  Magnesium     Status: None   Collection Time: 04/20/23  9:40 AM  Result Value Ref Range   Magnesium 1.7 1.7 - 2.4 mg/dL    Comment: Performed at [facility], [address], [place], [place] [address]  CBC with Differential/Platelet     Status: Abnormal   Collection Time: 04/20/23  9:40 AM  Result Value Ref Range   WBC 32.2 (H) 4.0 - 10.5 K/uL   RBC 4.91 3.87 - 5.11 MIL/uL   Hemoglobin 13.2 12.0 - 15.0 g/dL   HCT 96.2 95.2 - 84.1 %   MCV 82.7 80.0 - 100.0 fL   MCH 26.9 26.0 - 34.0 pg   MCHC 32.5 30.0 - 36.0 g/dL   RDW 32.4 40.1 - 02.7 %    Platelets 300 150 - 400 K/uL   nRBC 0.0 0.0 - 0.2 %   Neutrophils Relative % 75 %   Neutro Abs 28.3 (H) 1.7 - 7.7 K/uL   Band Neutrophils 13 %   Lymphocytes Relative 6 %   Lymphs Abs 1.9 0.7 - 4.0 K/uL   Monocytes Relative 6 %   Monocytes Absolute 1.9 (H) 0.1 - 1.0 K/uL   Eosinophils Relative 0 %   Eosinophils Absolute 0.0 0.0 - 0.5 K/uL   Basophils Relative 0 %   Basophils Absolute 0.0 0.0 - 0.1 K/uL   WBC Morphology REACTIVE LYMPHS    RBC Morphology POLYCHROMASIA    Smear Review MORPHOLOGY UNREMARKABLE    Abs Immature Granulocytes 0.00 0.00 - 0.07 K/uL   Reactive, Benign Lymphocytes PRESENT    Polychromasia PRESENT    Basophilic Stippling PRESENT     Comment: Performed at [facility], [address] [place], [place] [address]  Troponin I (High Sensitivity)     Status: None   Collection Time: 04/20/23  9:40 AM  Result Value Ref Range   Troponin I (High Sensitivity) 7 <18 ng/L    Comment: (NOTE) Elevated high sensitivity troponin I (hsTnI) values and significant  changes across serial measurements may suggest ACS but many other  chronic and acute conditions are known to elevate hsTnI results.  Refer to the "Links" section for chest pain algorithms and additional  guidance. Performed at [facility], [address], [place], [place] [address]   Blood gas, venous     Status: Abnormal   Collection Time: 04/20/23 10:24 AM  Result Value Ref Range   pH, Ven 7.51 (H) 7.25 - 7.43   pCO2, Ven 43 (L) 44 - 60 mmHg   pO2, Ven 133 (H) 32 - 45 mmHg   Bicarbonate 34.3 (H) 20.0 - 28.0 mmol/L   Acid-Base Excess 10.1 (H) 0.0 - 2.0 mmol/L   O2 Saturation 98.8 %   Patient temperature 37.2    Collection site RIGHT ANTECUBITAL    Drawn by [phone]     Comment: Performed at [facility], [address], [place], [place] [address]  Blood culture (routine x 2)     Status: None (Preliminary result)   Collection Time: 04/20/23  10:24 AM   Specimen: BLOOD RIGHT HAND  Result Value Ref Range    Specimen Description      BLOOD RIGHT HAND BOTTLES DRAWN AEROBIC AND ANAEROBIC   Special Requests      Blood Culture results may not be optimal due to an excessive volume of blood received in culture bottles Performed at [facility], [address], [place], [place] [address]    Culture PENDING    Report Status PENDING   Blood culture (routine x 2)     Status: None (Preliminary result)   Collection Time: 04/20/23 10:24 AM   Specimen: BLOOD LEFT HAND  Result Value Ref Range   Specimen Description      BLOOD LEFT HAND BOTTLES DRAWN AEROBIC AND ANAEROBIC   Special Requests      Blood Culture results may not be optimal due to an excessive volume of blood received in culture bottles Performed at [facility], [address] [place], [place] [address]    Culture PENDING    Report Status PENDING   Troponin I (High Sensitivity)     Status: None   Collection Time: 04/20/23 12:49 PM  Result Value Ref Range   Troponin I (High Sensitivity) 6 <18 ng/L    Comment: (NOTE) Elevated high sensitivity troponin I (hsTnI) values and significant  changes across serial measurements may suggest ACS but many other  chronic and acute conditions are known to elevate hsTnI results.  Refer to the "Links" section for chest pain algorithms and additional  guidance. Performed at [facility], [address], [place], [place] [address]    CT Angio Chest PE W and/or Wo Contrast  Result Date: 04/20/2023 CLINICAL DATA:  Pulmonary embolism (PE) suspected, high prob EXAM: CT ANGIOGRAPHY CHEST WITH CONTRAST TECHNIQUE: Multidetector CT imaging of the chest was performed using the standard protocol during bolus administration of intravenous contrast. Multiplanar CT image reconstructions and MIPs were obtained to evaluate the vascular anatomy. RADIATION DOSE REDUCTION: This exam was performed according to the departmental dose-optimization program which includes automated exposure control, adjustment of the mA and/or kV  according to patient size and/or use of iterative reconstruction technique. CONTRAST:  75mL OMNIPAQUE IOHEXOL 350 MG/ML SOLN COMPARISON:  CTA CAP, 04/19/2022.  Chest XR, earlier same day. FINDINGS: Cardiovascular: Satisfactory opacification of the pulmonary arteries to the segmental level. No segmental or larger pulmonary embolus. Normal heart size. No pericardial effusion. Mediastinum/Nodes: Similar appearance of mediastinal and BILATERAL hilar adenopathy since 03/2022. No enlarged axillary lymph nodes. Thyroid gland, trachea, and esophagus demonstrate no significant findings. Lungs/Pleura: Multifocal/innumerable, basilar-predominant tree-in-bud nodularities. Peribronchial wall thickening. Findings improved in appearance since 04/19/2022 comparison. Bibasilar dependent consolidations. No pleural effusion or pneumothorax. Upper Abdomen: No acute abnormality. Musculoskeletal: Mild, chronic-appearing T7 superior endplate deformity. No acute chest wall abnormality. No acute or significant osseous findings. Review of the MIP images confirms the above findings. IMPRESSION: 1. No segmental or larger pulmonary embolus. 2. Innumerable multifocal basilar-predominant tree-in-bud nodularities, most likely consistent with chronic inflammatory change. 3. Mediastinal and hilar adenopathy, unchanged since 03/2022. 4. Chronic T7 mild superior endplate compression deformity. Additional incidental, chronic and senescent findings as above. Roanna Banning, MD [facility] Radiology Electronically Signed   By: Roanna Banning M.D.   On: 04/20/2023 13:05   DG Chest Port 1 View  Result Date: 04/20/2023 CLINICAL DATA:  Shortness of breath over the last few days. Asthma and COPD. EXAM: PORTABLE CHEST 1 VIEW COMPARISON:  04/19/2022 FINDINGS: Heart size remains normal.  Mediastinal shadows are normal. Chronic interstitial lung markings. Worsened patchy density in both lower lobes consistent with  bibasilar bronchopneumonia. No lobar consolidation or collapse. No measurable effusion. IMPRESSION: Worsened patchy density in both lower lobes consistent with bibasilar bronchopneumonia. Electronically Signed   By: Paulina Fusi M.D.   On: 04/20/2023 09:55    Pending Labs Unresulted Labs (From admission, onward)     Start     Ordered   04/21/23 0500  Basic metabolic panel  Daily,   R      04/20/23 1419   04/21/23 0500  CBC  Daily,   R      04/20/23 1419   04/21/23 0500  Protime-INR  Tomorrow morning,   R        04/20/23 1419   04/21/23 0500  APTT  Tomorrow morning,   R        04/20/23 1419   04/20/23 1441  Lactic acid, plasma  Add-on,   AD        04/20/23 1440   04/20/23 1441  Procalcitonin  Once,   R       References:    Procalcitonin Lower Respiratory Tract Infection AND Sepsis Procalcitonin Algorithm   04/20/23 1440   04/20/23 1419  Legionella Pneumophila Serogp 1 Ur Ag  (COPD / Pneumonia / Cellulitis / Lower Extremity Wound)  Once,   R        04/20/23 1419   04/20/23 1419  Strep pneumoniae urinary antigen  (COPD / Pneumonia / Cellulitis / Lower Extremity Wound)  Once,   R        04/20/23 1419   04/20/23 1419  MRSA Next Gen by PCR, Nasal  (COPD / Pneumonia / Cellulitis / Lower Extremity Wound)  Once,   R        04/20/23 1419   04/20/23 1417  CBC  (heparin)  Once,   R       Comments: Baseline for heparin therapy IF NOT ALREADY DRAWN.  Notify MD if PLT < 100 K.    04/20/23 1419   04/20/23 1417  Creatinine, serum  (heparin)  Once,   R       Comments: Baseline for heparin therapy IF NOT ALREADY DRAWN.    04/20/23 1419   04/20/23 1417  HIV Antibody (routine testing w rflx)  (HIV Antibody (Routine testing w reflex) panel)  Once,   R        04/20/23 1419   04/20/23 1417  Phosphorus  Add-on,   AD        04/20/23 1419   04/20/23 1417  Protime-INR  Add-on,   AD        04/20/23 1419   04/20/23 1417  Expectorated Sputum Assessment w Gram Stain, Rflx to Resp Cult  Once,   R        Comments: If productive cough    04/20/23 1419   04/20/23 1417  Hemoglobin A1c  (Glycemic Control (SSI)  Q 4 Hours / Glycemic Control (SSI)  AC +/- HS)  Once,   R       Comments: To assess prior glycemic control    04/20/23 1419            Vitals/Pain Today's Vitals   04/20/23 1330 04/20/23 1400 04/20/23 1430 04/20/23 1445  BP: 111/81 108/72 114/72   Pulse: (!) 108 (!) 106 (!) 101 (!) 106  Resp: 17 (!) 21 19 (!) 25  Temp:      TempSrc:  SpO2: 96% 95% 95% 96%  PainSc:        Isolation Precautions No active isolations  Medications Medications  heparin injection 5,000 Units (5,000 Units Subcutaneous Given 04/20/23 1435)  sodium chloride flush (NS) 0.9 % injection 3 mL (has no administration in time range)  0.9 %  sodium chloride infusion ( Intravenous New Bag/Given 04/20/23 1435)  sodium chloride flush (NS) 0.9 % injection 3 mL (has no administration in time range)  acetaminophen (TYLENOL) tablet 650 mg (has no administration in time range)    Or  acetaminophen (TYLENOL) suppository 650 mg (has no administration in time range)  oxyCODONE (Oxy IR/ROXICODONE) immediate release tablet 5 mg (has no administration in time range)  HYDROmorphone (DILAUDID) injection 0.5-1 mg (has no administration in time range)  senna-docusate (Senokot-S) tablet 1 tablet (has no administration in time range)  bisacodyl (DULCOLAX) EC tablet 5 mg (has no administration in time range)  sodium phosphate (FLEET) 7-19 GM/118ML enema 1 enema (has no administration in time range)  ondansetron (ZOFRAN) tablet 4 mg (has no administration in time range)    Or  ondansetron (ZOFRAN) injection 4 mg (has no administration in time range)  hydrALAZINE (APRESOLINE) injection 10 mg (has no administration in time range)  insulin aspart (novoLOG) injection 0-9 Units (has no administration in time range)  magnesium sulfate IVPB 2 g 50 mL (2 g Intravenous New Bag/Given 04/20/23 1436)  hydrocortisone sodium  succinate (SOLU-CORTEF) 100 MG injection 100 mg (has no administration in time range)  azithromycin (ZITHROMAX) 500 mg in sodium chloride 0.9 % 250 mL IVPB (has no administration in time range)  cefTRIAXone (ROCEPHIN) 2 g in sodium chloride 0.9 % 100 mL IVPB (has no administration in time range)  ipratropium (ATROVENT) nebulizer solution 0.5 mg (has no administration in time range)  traZODone (DESYREL) tablet 50 mg (has no administration in time range)  Chlorhexidine Gluconate Cloth 2 % PADS 6 each (has no administration in time range)  levalbuterol (XOPENEX) nebulizer solution 0.63 mg (has no administration in time range)  SUMAtriptan (IMITREX) tablet 50 mg (has no administration in time range)  irbesartan (AVAPRO) tablet 150 mg (has no administration in time range)  rosuvastatin (CRESTOR) tablet 20 mg (has no administration in time range)  glipiZIDE (GLUCOTROL) tablet 10 mg (has no administration in time range)  insulin glargine-yfgn (SEMGLEE) injection 20 Units (has no administration in time range)  pantoprazole (PROTONIX) EC tablet 40 mg (has no administration in time range)  rOPINIRole (REQUIP) tablet 1 mg (has no administration in time range)  loratadine (CLARITIN) tablet 10 mg (has no administration in time range)  lactobacillus acidophilus (BACID) tablet 2 tablet (has no administration in time range)  methylPREDNISolone sodium succinate (SOLU-MEDROL) 125 mg/2 mL injection 125 mg (125 mg Intravenous Given 04/20/23 0935)  ipratropium-albuterol (DUONEB) 0.5-2.5 (3) MG/3ML nebulizer solution 3 mL (3 mLs Nebulization Given 04/20/23 0942)  ondansetron (ZOFRAN) injection 4 mg (4 mg Intravenous Given 04/20/23 0936)  cefTRIAXone (ROCEPHIN) 1 g in sodium chloride 0.9 % 100 mL IVPB (0 g Intravenous Stopped 04/20/23 1102)  azithromycin (ZITHROMAX) 500 mg in sodium chloride 0.9 % 250 mL IVPB (0 mg Intravenous Stopped 04/20/23 1216)  aspirin chewable tablet 324 mg (324 mg Oral Given 04/20/23 1014)   fentaNYL (SUBLIMAZE) injection 50 mcg (50 mcg Intravenous Given 04/20/23 1012)  lactated ringers bolus 500 mL (0 mLs Intravenous Stopped 04/20/23 1216)  iohexol (OMNIPAQUE) 350 MG/ML injection 75 mL (75 mLs Intravenous Contrast Given 04/20/23 1238)    Mobility walks  Focused Assessments Pulmonary Assessment Handoff:  Lung sounds: Bilateral Breath Sounds: Diminished O2 Device: Nasal Cannula O2 Flow Rate (L/min): 2 L/min    R Recommendations: See Admitting Provider Note  Report given to:   Additional Notes:

## 2023-04-20 NOTE — Assessment & Plan Note (Signed)
-   Stable not on any hypertensive medication at home -Will monitor closely anticipation of hypotension with sepsis

## 2023-04-20 NOTE — ED Notes (Signed)
Pt removed from bipap per provider.

## 2023-04-20 NOTE — Assessment & Plan Note (Signed)
-   Home medication include Robaxin, rec Requip, Tylenol, -Monitor closely, continue Requip as needed analgesics

## 2023-04-20 NOTE — Assessment & Plan Note (Signed)
-   Acute on chronic respiratory failure-due to multifocal pneumonia with underlying COPD, COPD exacerbation -Baseline O2 demand 3 L -Admitted to stepdown unit  -POA: CPAP/BiPAP>>> weaning down to O2 via nasal cannula, currently on 4 L of oxygen, satting -Patient has received IV Solu-Medrol, IV antibiotics, DuoNeb bronchodilator treatments -Will continue with the steroids, IV antibiotics, DuoNeb bronchodilators scheduled and as needed ABG    Component Value Date/Time   PHART 7.314 (L) 11/27/2019 1205   PCO2ART 85.8 (HH) 11/27/2019 1205   PO2ART 88.5 11/27/2019 1205   HCO3 34.3 (H) 04/20/2023 1024   TCO2 75.0 05/30/2016 1500   O2SAT 98.8 04/20/2023 1024   -Will monitor closely

## 2023-04-20 NOTE — Assessment & Plan Note (Signed)
-  Potassium 3.1, magnesium 1.7 Repleting potassium orally and magnesium 2 g IV

## 2023-04-20 NOTE — Assessment & Plan Note (Addendum)
-   Currently stable,  - As needed Xanax

## 2023-04-20 NOTE — Assessment & Plan Note (Signed)
-  Pleuritic due to cough, pneumonia, COPD exacerbation - Troponin 7, 6 -No changes in EKG -Treating underlying causes, along with a PR nitroglycerin aspirin

## 2023-04-20 NOTE — Assessment & Plan Note (Signed)
Continue PPI ?

## 2023-04-20 NOTE — ED Provider Notes (Addendum)
[place] EMERGENCY DEPARTMENT AT [facility] Provider Note   CSN: [id number] Arrival date & time: 04/20/23  1308     History  Chief Complaint  Patient presents with   Respiratory Distress    [name] is a 57 y.o. female.  HPI Patient presents for shortness of breath.  Onset was yesterday.  Last night, she did have some left-sided chest pain.  She has had recent congestion and cough that is productive of yellow sputum.  She has had subjective fevers and chills.  Fire department arrived initially on scene.  They placed on 3 L of supplemental oxygen.  Despite this, EMS noted SpO2 of 86% on supplemental oxygen.  She was placed on CPAP with further improvement in SpO2.  She was given 1 SL NTG prior to arrival.  No other interventions were taken.  Patient currently endorses pain in area under left breast.  She denies any other areas of discomfort.  She does have current nausea.    Home Medications Prior to Admission medications   Medication Sig Start Date End Date Taking? Authorizing Provider  acetaminophen (TYLENOL) 500 MG tablet Take 500 mg by mouth every 6 (six) hours as needed for fever or mild pain.   Yes [provider]  albuterol (PROVENTIL) (2.5 MG/3ML) 0.083% nebulizer solution Take 3 mLs (2.5 mg total) by nebulization every 6 (six) hours as needed for wheezing or shortness of breath. 03/13/22  Yes Emokpae, Courage, MD  albuterol (VENTOLIN HFA) 108 (90 Base) MCG/ACT inhaler Inhale 2 puffs into the lungs every 4 (four) hours as needed for wheezing or shortness of breath. 03/13/22  Yes Emokpae, Courage, MD  fluticasone (FLONASE) 50 MCG/ACT nasal spray Place 2 sprays into both nostrils daily. 04/13/18  Yes [provider]  glipiZIDE (GLUCOTROL) 10 MG tablet Take 10 mg by mouth daily.   Yes [provider]  insulin glargine (LANTUS) 100 UNIT/ML Solostar Pen Inject 18 Units into the skin daily. 03/13/22  Yes Emokpae, Courage, MD  irbesartan (AVAPRO)  150 MG tablet TAKE 1 TABLET BY MOUTH EVERY DAY Patient taking differently: Take 150 mg by mouth daily. 05/29/21  Yes Nyoka Cowden, MD  loratadine (CLARITIN) 10 MG tablet Take 10 mg by mouth daily. 08/13/21  Yes [provider]  methocarbamol (ROBAXIN) 500 MG tablet Take 500 mg by mouth every 8 (eight) hours as needed for muscle spasms. 08/13/21  Yes [provider]  ondansetron (ZOFRAN-ODT) 4 MG disintegrating tablet 4mg  ODT q4 hours prn nausea/vomit 04/19/22  Yes Bethann Berkshire, MD  pantoprazole (PROTONIX) 40 MG tablet Take 1 tablet (40 mg total) by mouth daily. 03/13/22  Yes Emokpae, Courage, MD  polyethylene glycol (MIRALAX / GLYCOLAX) 17 g packet Take 17 g by mouth daily. 03/13/22  Yes Emokpae, Courage, MD  rOPINIRole (REQUIP) 1 MG tablet Take 1 mg by mouth at bedtime. 03/09/23  Yes [provider]  rosuvastatin (CRESTOR) 20 MG tablet Take 20 mg by mouth daily. 06/02/21  Yes [provider]  SUMAtriptan (IMITREX) 50 MG tablet Take 50 mg by mouth as needed for migraine or headache. 11/29/21  Yes [provider]  ARIPiprazole (ABILIFY) 5 MG tablet Take 5 mg by mouth daily.    02/03/12  [provider]  venlafaxine (EFFEXOR) 75 MG tablet Take 75 mg by mouth daily.    02/03/12  [provider]      Allergies    Levofloxacin, Levofloxacin in d5w, Penicillins, Doxycycline, and Morphine    Review  of Systems   Review of Systems  Constitutional:  Positive for chills, fatigue and fever.  HENT:  Positive for congestion and rhinorrhea.   Respiratory:  Positive for cough and shortness of breath.   Cardiovascular:  Positive for chest pain.  Gastrointestinal:  Positive for nausea.  All other systems reviewed and are negative.   Physical Exam Updated Vital Signs BP (!) 130/57   Pulse (!) 107   Temp 97.9 F (36.6 C) (Oral)   Resp (!) 25   Ht 5\' 1"  (1.549 m)   Wt 63.7 kg   SpO2 90%   BMI 26.53 kg/m  Physical Exam Vitals and nursing note  reviewed.  Constitutional:      General: She is not in acute distress.    Appearance: She is well-developed. She is ill-appearing. She is not toxic-appearing or diaphoretic.  HENT:     Head: Normocephalic and atraumatic.     Right Ear: External ear normal.     Left Ear: External ear normal.     Nose: Nose normal.     Mouth/Throat:     Mouth: Mucous membranes are moist.  Eyes:     Extraocular Movements: Extraocular movements intact.     Conjunctiva/sclera: Conjunctivae normal.  Cardiovascular:     Rate and Rhythm: Regular rhythm. Tachycardia present.     Heart sounds: No murmur heard. Pulmonary:     Effort: Pulmonary effort is normal. Tachypnea present.     Breath sounds: Wheezing and rales present.  Abdominal:     General: There is no distension.     Palpations: Abdomen is soft.     Tenderness: There is no abdominal tenderness.  Musculoskeletal:        General: No swelling. Normal range of motion.     Cervical back: Normal range of motion and neck supple.     Right lower leg: No edema.     Left lower leg: No edema.  Skin:    General: Skin is warm and dry.     Coloration: Skin is not jaundiced or pale.  Neurological:     General: No focal deficit present.     Mental Status: She is alert and oriented to person, place, and time.  Psychiatric:        Mood and Affect: Mood normal.        Behavior: Behavior normal.     ED Results / Procedures / Treatments   Labs (all labs ordered are listed, but only abnormal results are displayed) Labs Reviewed  COMPREHENSIVE METABOLIC PANEL - Abnormal; Notable for the following components:      Result Value   Sodium 128 (*)    Potassium 3.1 (*)    Chloride 85 (*)    Glucose, Bld 247 (*)    BUN 27 (*)    Albumin 3.0 (*)    AST 14 (*)    Anion gap 16 (*)    All other components within normal limits  BRAIN NATRIURETIC PEPTIDE - Abnormal; Notable for the following components:   B Natriuretic Peptide 110.0 (*)    All other components  within normal limits  BLOOD GAS, VENOUS - Abnormal; Notable for the following components:   pH, Ven 7.51 (*)    pCO2, Ven 43 (*)    pO2, Ven 133 (*)    Bicarbonate 34.3 (*)    Acid-Base Excess 10.1 (*)    All other components within normal limits  CBC WITH DIFFERENTIAL/PLATELET - Abnormal; Notable for the following components:  WBC 32.2 (*)    Neutro Abs 28.3 (*)    Monocytes Absolute 1.9 (*)    All other components within normal limits  CBC - Abnormal; Notable for the following components:   WBC 26.3 (*)    Hemoglobin 11.9 (*)    All other components within normal limits  PHOSPHORUS - Abnormal; Notable for the following components:   Phosphorus 2.4 (*)    All other components within normal limits  GLUCOSE, CAPILLARY - Abnormal; Notable for the following components:   Glucose-Capillary 361 (*)    All other components within normal limits  RESP PANEL BY RT-PCR (RSV, FLU A&B, COVID)  RVPGX2  CULTURE, BLOOD (ROUTINE X 2)  CULTURE, BLOOD (ROUTINE X 2)  EXPECTORATED SPUTUM ASSESSMENT W GRAM STAIN, RFLX TO RESP C  MRSA NEXT GEN BY PCR, NASAL  CULTURE, RESPIRATORY W GRAM STAIN  MAGNESIUM  CREATININE, SERUM  PROCALCITONIN  LACTIC ACID, PLASMA  PROTIME-INR  APTT  LACTIC ACID, PLASMA  HIV ANTIBODY (ROUTINE TESTING W REFLEX)  HEMOGLOBIN A1C  LEGIONELLA PNEUMOPHILA SEROGP 1 UR AG  STREP PNEUMONIAE URINARY ANTIGEN  BASIC METABOLIC PANEL  CBC  PROTIME-INR  APTT  TROPONIN I (HIGH SENSITIVITY)  TROPONIN I (HIGH SENSITIVITY)    EKG EKG Interpretation  Date/Time:  Tuesday Apr 20 2023 09:40:13 EDT Ventricular Rate:  131 PR Interval:  121 QRS Duration: 139 QT Interval:  329 QTC Calculation: 486 R Axis:   29 Text Interpretation: Sinus tachycardia Right bundle branch block Inferior infarct, old Confirmed by Gloris Manchester [phone]) on 04/20/2023 9:42:52 AM  Radiology CT Angio Chest PE W and/or Wo Contrast  Result Date: 04/20/2023 CLINICAL DATA:  Pulmonary embolism (PE) suspected,  high prob EXAM: CT ANGIOGRAPHY CHEST WITH CONTRAST TECHNIQUE: Multidetector CT imaging of the chest was performed using the standard protocol during bolus administration of intravenous contrast. Multiplanar CT image reconstructions and MIPs were obtained to evaluate the vascular anatomy. RADIATION DOSE REDUCTION: This exam was performed according to the departmental dose-optimization program which includes automated exposure control, adjustment of the mA and/or kV according to patient size and/or use of iterative reconstruction technique. CONTRAST:  75mL OMNIPAQUE IOHEXOL 350 MG/ML SOLN COMPARISON:  CTA CAP, 04/19/2022.  Chest XR, earlier same day. FINDINGS: Cardiovascular: Satisfactory opacification of the pulmonary arteries to the segmental level. No segmental or larger pulmonary embolus. Normal heart size. No pericardial effusion. Mediastinum/Nodes: Similar appearance of mediastinal and BILATERAL hilar adenopathy since 03/2022. No enlarged axillary lymph nodes. Thyroid gland, trachea, and esophagus demonstrate no significant findings. Lungs/Pleura: Multifocal/innumerable, basilar-predominant tree-in-bud nodularities. Peribronchial wall thickening. Findings improved in appearance since 04/19/2022 comparison. Bibasilar dependent consolidations. No pleural effusion or pneumothorax. Upper Abdomen: No acute abnormality. Musculoskeletal: Mild, chronic-appearing T7 superior endplate deformity. No acute chest wall abnormality. No acute or significant osseous findings. Review of the MIP images confirms the above findings. IMPRESSION: 1. No segmental or larger pulmonary embolus. 2. Innumerable multifocal basilar-predominant tree-in-bud nodularities, most likely consistent with chronic inflammatory change. 3. Mediastinal and hilar adenopathy, unchanged since 03/2022. 4. Chronic T7 mild superior endplate compression deformity. Additional incidental, chronic and senescent findings as above. Roanna Banning, MD [facility] Radiology Electronically Signed   By: Roanna Banning M.D.   On: 04/20/2023 13:05   DG Chest Port 1 View  Result Date: 04/20/2023 CLINICAL DATA:  Shortness of breath over the last few days. Asthma and COPD. EXAM: PORTABLE CHEST 1 VIEW COMPARISON:  04/19/2022 FINDINGS: Heart size remains normal. Mediastinal shadows are normal. Chronic  interstitial lung markings. Worsened patchy density in both lower lobes consistent with bibasilar bronchopneumonia. No lobar consolidation or collapse. No measurable effusion. IMPRESSION: Worsened patchy density in both lower lobes consistent with bibasilar bronchopneumonia. Electronically Signed   By: Paulina Fusi M.D.   On: 04/20/2023 09:55    Procedures Procedures    Medications Ordered in ED Medications  heparin injection 5,000 Units (5,000 Units Subcutaneous Given 04/20/23 1435)  sodium chloride flush (NS) 0.9 % injection 3 mL (has no administration in time range)  acetaminophen (TYLENOL) tablet 650 mg (has no administration in time range)    Or  acetaminophen (TYLENOL) suppository 650 mg (has no administration in time range)  oxyCODONE (Oxy IR/ROXICODONE) immediate release tablet 5 mg (5 mg Oral Given 04/20/23 1632)  HYDROmorphone (DILAUDID) injection 0.5-1 mg (has no administration in time range)  senna-docusate (Senokot-S) tablet 1 tablet (has no administration in time range)  bisacodyl (DULCOLAX) EC tablet 5 mg (has no administration in time range)  sodium phosphate (FLEET) 7-19 GM/118ML enema 1 enema (has no administration in time range)  ondansetron (ZOFRAN) tablet 4 mg (has no administration in time range)    Or  ondansetron (ZOFRAN) injection 4 mg (has no administration in time range)  hydrALAZINE (APRESOLINE) injection 10 mg (has no administration in time range)  insulin aspart (novoLOG) injection 0-9 Units (9 Units Subcutaneous Given 04/20/23 1632)  hydrocortisone sodium succinate (SOLU-CORTEF) 100 MG  injection 100 mg (has no administration in time range)  azithromycin (ZITHROMAX) 500 mg in sodium chloride 0.9 % 250 mL IVPB (has no administration in time range)  cefTRIAXone (ROCEPHIN) 2 g in sodium chloride 0.9 % 100 mL IVPB (has no administration in time range)  ipratropium (ATROVENT) nebulizer solution 0.5 mg (0.5 mg Nebulization Given 04/20/23 1459)  traZODone (DESYREL) tablet 50 mg (has no administration in time range)  Chlorhexidine Gluconate Cloth 2 % PADS 6 each (6 each Topical Given 04/20/23 1558)  levalbuterol (XOPENEX) nebulizer solution 0.63 mg (0.63 mg Nebulization Given 04/20/23 1459)  SUMAtriptan (IMITREX) tablet 50 mg (has no administration in time range)  irbesartan (AVAPRO) tablet 150 mg (150 mg Oral Given 04/20/23 1558)  rosuvastatin (CRESTOR) tablet 20 mg (20 mg Oral Given 04/20/23 1558)  glipiZIDE (GLUCOTROL) tablet 10 mg (has no administration in time range)  pantoprazole (PROTONIX) EC tablet 40 mg (40 mg Oral Given 04/20/23 1558)  rOPINIRole (REQUIP) tablet 1 mg (has no administration in time range)  loratadine (CLARITIN) tablet 10 mg (has no administration in time range)  acidophilus (RISAQUAD) capsule 1 capsule (1 capsule Oral Given 04/20/23 1558)  lactated ringers infusion (150 mL/hr Intravenous New Bag/Given 04/20/23 1559)  insulin glargine-yfgn (SEMGLEE) injection 25 Units (has no administration in time range)  methylPREDNISolone sodium succinate (SOLU-MEDROL) 125 mg/2 mL injection 125 mg (125 mg Intravenous Given 04/20/23 0935)  ipratropium-albuterol (DUONEB) 0.5-2.5 (3) MG/3ML nebulizer solution 3 mL (3 mLs Nebulization Given 04/20/23 0942)  ondansetron (ZOFRAN) injection 4 mg (4 mg Intravenous Given 04/20/23 0936)  cefTRIAXone (ROCEPHIN) 1 g in sodium chloride 0.9 % 100 mL IVPB (0 g Intravenous Stopped 04/20/23 1102)  azithromycin (ZITHROMAX) 500 mg in sodium chloride 0.9 % 250 mL IVPB (0 mg Intravenous Stopped 04/20/23 1216)  aspirin chewable tablet 324 mg (324 mg Oral  Given 04/20/23 1014)  fentaNYL (SUBLIMAZE) injection 50 mcg (50 mcg Intravenous Given 04/20/23 1012)  lactated ringers bolus 500 mL (0 mLs Intravenous Stopped 04/20/23 1216)  iohexol (OMNIPAQUE) 350 MG/ML injection 75 mL (75 mLs Intravenous Contrast Given 04/20/23 1238)  magnesium sulfate IVPB 2 g 50 mL (2 g Intravenous New Bag/Given 04/20/23 1436)  magnesium sulfate 2 GM/50ML IVPB (2 g  New Bag/Given 04/20/23 1602)    ED Course/ Medical Decision Making/ A&P                             Medical Decision Making Amount and/or Complexity of Data Reviewed Labs: ordered. Radiology: ordered.  Risk OTC drugs. Prescription drug management. Decision regarding hospitalization.   This patient presents to the ED for concern of shortness of breath, this involves an extensive number of treatment options, and is a complaint that carries with it a high risk of complications and morbidity.  The differential diagnosis includes COPD exacerbation, pneumonia, CHF, allergic reaction, anemia, ACS, valvular dysfunction   Co morbidities that complicate the patient evaluation  DM, HLD, COPD, GERD, HTN   Additional history obtained:  Additional history obtained from EMS External records from outside source obtained and reviewed including EMR   Lab Tests:  I Ordered, and personally interpreted labs.  The pertinent results include: Large leukocytosis is present.  Anemia is baseline.  BNP is mildly elevated.  Troponin is normal.  Mild hypokalemia is present.   Imaging Studies ordered:  I ordered imaging studies including chest x-ray, CTA chest I independently visualized and interpreted imaging which showed multifocal pneumonia I agree with the radiologist interpretation   Cardiac Monitoring: / EKG:  The patient was maintained on a cardiac monitor.  I personally viewed and interpreted the cardiac monitored which showed an underlying rhythm of: Sinus rhythm   Problem List / ED Course / Critical  interventions / Medication management  Patient presents for shortness of breath.  Onset was yesterday.  She has had recent yellow sputum production, chills, fatigue, and nausea.  EMS reported SpO2 of 85% on her home 3 L of oxygen on scene.  She was placed on CPAP prior to arrival.  Patient arrives on CPAP.  Faint wheezing is present on lung auscultation.  Patient was placed on BiPAP.  Solu-Medrol and albuterol were ordered.  Given her recent symptoms and history of COPD, antibiotics were initiated.  Lab work shows a large leukocytosis.  Imaging shows multifocal pneumonia.  Patient was able to wean off of BiPAP.  On reassessment, she is resting comfortably on nasal cannula, currently at 4 L.  Patient was admitted to medicine for further management. I ordered medication including Solu-Medrol and albuterol for COPD exacerbation; ceftriaxone and azithromycin for multifocal pneumonia; Zofran for nausea; fentanyl for analgesia; IVF for hydration Reevaluation of the patient after these medicines showed that the patient improved I have reviewed the patients home medicines and have made adjustments as needed   Social Determinants of Health:  Has PCP  CRITICAL CARE Performed by: Gloris Manchester   Total critical care time: 35 minutes  Critical care time was exclusive of separately billable procedures and treating other patients.  Critical care was necessary to treat or prevent imminent or life-threatening deterioration.  Critical care was time spent personally by me on the following activities: development of treatment plan with patient and/or surrogate as well as nursing, discussions with consultants, evaluation of patient's response to treatment, examination of patient, obtaining history from patient or surrogate, ordering and performing treatments and interventions, ordering and review of laboratory studies, ordering and review of radiographic studies, pulse oximetry and re-evaluation of patient's  condition.        Final Clinical Impression(s) / ED  Diagnoses Final diagnoses:  COPD exacerbation (HCC)  Multifocal pneumonia  Acute on chronic respiratory failure with hypoxia [facility])    Rx / DC Orders ED Discharge Orders     None         Gloris Manchester, MD 04/20/23 1740    Gloris Manchester, MD 04/20/23 1740

## 2023-04-20 NOTE — Assessment & Plan Note (Signed)
-   Chronically O2 dependent 3 L -Continue IV steroids, DuoNeb bronchodilators

## 2023-04-20 NOTE — Progress Notes (Signed)
Date and time results received: 04/20/23 1846 (use smartphrase ".now" to insert current time)  Test: MRSA PCR Critical Value: POSITIVE  Name of Provider Notified: Saddie Benders, MD.  Orders Received? Or Actions Taken?:  Standing orders placed.

## 2023-04-20 NOTE — Assessment & Plan Note (Addendum)
-   Meaning SIRS and early signs of sepsis: Blood pressure 111/81, pulse (!) 108, temperature 97.9 F RR 17, SpO2 96 % off BiPAP on 4 L -Initially with acute on chronic respiratory failure, leukocytosis, tachycardic -WBC: 32.2 -Obtaining lactic acid, Procalcitonin -Initiating sepsis protocol, IV fluid resuscitation, IV antibiotics,

## 2023-04-20 NOTE — Sepsis Progress Note (Signed)
eLink is following this Code Sepsis. °

## 2023-04-20 NOTE — Assessment & Plan Note (Signed)
-  Continue statins ?

## 2023-04-20 NOTE — H&P (Signed)
History and Physical   Patient: [name]                            PCP: Kara Pacer, NP                    DOB: 1966-09-15            DOA: 04/20/2023 ZOX:[id number]             DOS: 04/20/2023, 2:53 PM  Nsumanganyi, Colleen Can, NP  Patient coming from:   HOME  I have personally reviewed patient's medical records, in electronic medical records, including:  [facility], and care everywhere.    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  Felt warm had some chills.  EMS evaluation on the patient hypoxic satting 86% on 3 L of oxygen, she was placed on CPAP, 1 sublingual nitroglycerin was given.  ED course/evaluation: Blood pressure 111/81, pulse (!) 108, temperature 97.9 F (36.6 C), temperature source Oral, RR  17, SpO2 96 %.  CBC: WBC 32.2, neutrophils of 28.3, CMP: Sodium 128, potassium 3.8, chloride 85, glucose 247, BUN 27, albumin 3.0, anion gap 16, AST 14, BNP 110, troponin 7, 6  CTA: iMPRESSION: 1. No segmental or larger pulmonary embolus. 2. Innumerable multifocal basilar-predominant tree-in-bud nodularities, most likely consistent with chronic inflammatory change. 3. Mediastinal and hilar adenopathy, unchanged since 03/2022. 4. Chronic T7 mild superior endplate compression deformity. Additional incidental, chronic and senescent findings as above.  ABG    Component Value Date/Time   PHART 7.314 (L) 11/27/2019 1205   PCO2ART  85.8 (HH) 11/27/2019 1205   PO2ART 88.5 11/27/2019 1205   HCO3 34.3 (H) 04/20/2023 1024   TCO2 75.0 05/30/2016 1500   O2SAT 98.8 04/20/2023 1024   SARS-CoV-2 negative, influenza A/B negative    -Patient was treated with DuoNeb bronchodilators, IV fluids, IV antibiotics, was placed on BiPAP.[name]  Has been weaned off to O2 by nasal cannula  Patient met SIRS/sepsis criteria -IV Solu-Medrol given, started on IV antibiotics of azithromycin and Rocephin    Patient Denies having: Fever, Chills, Chest Pain, Abd pain, N/V/D, headache, dizziness, lightheadedness,  Dysuria, Joint pain, rash, open wounds     Review of Systems: As per HPI, otherwise 10 point review of systems were negative.   ----------------------------------------------------------------------------------------------------------------------  Allergies  Allergen Reactions   Levofloxacin Anaphylaxis   Levofloxacin In D5w Itching, Swelling and Rash    IV only IV only   Penicillins Other (See Comments)    convulsions   Doxycycline Nausea And Vomiting   Morphine Nausea Only    Home MEDs:  Prior to Admission medications   Medication Sig Start Date End Date Taking? Authorizing Provider  acetaminophen (TYLENOL) 500 MG tablet Take 500 mg by mouth every 6 (six) hours as needed for fever or mild pain.   Yes [provider]  albuterol (PROVENTIL) (2.5 MG/3ML) 0.083% nebulizer solution  Take 3 mLs (2.5 mg total) by nebulization every 6 (six) hours as needed for wheezing or shortness of breath. 03/13/22  Yes Emokpae, Courage, MD  albuterol (VENTOLIN HFA) 108 (90 Base) MCG/ACT inhaler Inhale 2 puffs into the lungs every 4 (four) hours as needed for wheezing or shortness of breath. 03/13/22  Yes Emokpae, Courage, MD  fluticasone (FLONASE) 50 MCG/ACT nasal spray Place 2 sprays into both nostrils daily. 04/13/18  Yes [provider]  glipiZIDE (GLUCOTROL) 10 MG tablet Take 10 mg by mouth daily.   Yes [provider]   insulin glargine (LANTUS) 100 UNIT/ML Solostar Pen Inject 18 Units into the skin daily. 03/13/22  Yes Emokpae, Courage, MD  irbesartan (AVAPRO) 150 MG tablet TAKE 1 TABLET BY MOUTH EVERY DAY Patient taking differently: Take 150 mg by mouth daily. 05/29/21  Yes Nyoka Cowden, MD  loratadine (CLARITIN) 10 MG tablet Take 10 mg by mouth daily. 08/13/21  Yes [provider]  methocarbamol (ROBAXIN) 500 MG tablet Take 500 mg by mouth every 8 (eight) hours as needed for muscle spasms. 08/13/21  Yes [provider]  ondansetron (ZOFRAN-ODT) 4 MG disintegrating tablet 4mg  ODT q4 hours prn nausea/vomit 04/19/22  Yes Bethann Berkshire, MD  pantoprazole (PROTONIX) 40 MG tablet Take 1 tablet (40 mg total) by mouth daily. 03/13/22  Yes Emokpae, Courage, MD  polyethylene glycol (MIRALAX / GLYCOLAX) 17 g packet Take 17 g by mouth daily. 03/13/22  Yes Emokpae, Courage, MD  rOPINIRole (REQUIP) 1 MG tablet Take 1 mg by mouth at bedtime. 03/09/23  Yes [provider]  rosuvastatin (CRESTOR) 20 MG tablet Take 20 mg by mouth daily. 06/02/21  Yes [provider]  SUMAtriptan (IMITREX) 50 MG tablet Take 50 mg by mouth as needed for migraine or headache. 11/29/21  Yes [provider]  ARIPiprazole (ABILIFY) 5 MG tablet Take 5 mg by mouth daily.    02/03/12  [provider]  venlafaxine (EFFEXOR) 75 MG tablet Take 75 mg by mouth daily.    02/03/12  [provider]    PRN MEDs: acetaminophen **OR** acetaminophen, bisacodyl, hydrALAZINE, HYDROmorphone (DILAUDID) injection, ondansetron **OR** ondansetron (ZOFRAN) IV, oxyCODONE, senna-docusate, sodium chloride flush, sodium phosphate, SUMAtriptan, traZODone  Past Medical History:  Diagnosis Date   Angina    Anxiety state 10/15/2015   ARDS (adult respiratory distress syndrome) (HCC)    Jan 2011   Asthma    Chronic back pain    COPD (chronic obstructive pulmonary disease) (HCC)    Diabetes mellitus    GERD  (gastroesophageal reflux disease) 12/21/2012   HTN (hypertension) 10/15/2015   Hyperglycemia, drug-induced    steroid induced hyperglycemia   Migraine headache    On home O2    Pneumonia    Recurrent upper respiratory infection (URI)    Shortness of breath     Past Surgical History:  Procedure Laterality Date   c-section     TRACHEOSTOMY     decannulated 12/2009   TUBAL LIGATION     uterine ablasion       reports that she has been smoking cigarettes. She has a 21.00 pack-year smoking history. She has never used smokeless tobacco. She reports that she does not drink alcohol and does not use drugs.   Family History  Problem Relation Age of Onset   Coronary artery disease Brother    Diabetes Other    Cancer Other    Hypertension Other     Physical Exam:   Vitals:  04/20/23 1330 04/20/23 1400 04/20/23 1430 04/20/23 1445  BP: 111/81 108/72 114/72   Pulse: (!) 108 (!) 106 (!) 101 (!) 106  Resp: 17 (!) 21 19 (!) 25  Temp:      TempSrc:      SpO2: 96% 95% 95% 96%   Constitutional: Acute to moderate distress with shortness of breath Eyes: PERRL, lids and conjunctivae normal ENMT: Mucous membranes are moist. Posterior pharynx clear of any exudate or lesions.Normal dentition.  Neck: normal, supple, no masses, no thyromegaly Respiratory: Diffuse rhonchi, wheezing, mildly labored breathing, positive air sounds diffuse bleeding diminished mid to lower lobe left greater than right, no crackles cardiovascular: Regular rate and rhythm, no murmurs / rubs / gallops. No extremity edema. 2+ pedal pulses. No carotid bruits.  Abdomen: no tenderness, no masses palpated. No hepatosplenomegaly. Bowel sounds positive.  Musculoskeletal: no clubbing / cyanosis. No joint deformity upper and lower extremities. Good ROM, no contractures. Normal muscle tone.  Neurologic: CN II-XII grossly intact. Sensation intact, DTR normal. Strength 5/5 in all 4.  Psychiatric: Normal judgment and insight. Alert  and oriented x 3. Normal mood.  Skin: no rashes, lesions, ulcers. No induration Decubitus/ulcers:  Wounds: per nursing documentation     Labs on admission:    I have personally reviewed following labs and imaging studies  CBC: Recent Labs  Lab 04/20/23 0940  WBC 32.2*  NEUTROABS 28.3*  HGB 13.2  HCT 40.6  MCV 82.7  PLT 300   Basic Metabolic Panel: Recent Labs  Lab 04/20/23 0940  NA 128*  K 3.1*  CL 85*  CO2 27  GLUCOSE 247*  BUN 27*  CREATININE 0.56  CALCIUM 9.0  MG 1.7   GFR: CrCl cannot be calculated (Unknown ideal weight.). Liver Function Tests: Recent Labs  Lab 04/20/23 0940  AST 14*  ALT 10  ALKPHOS 106  BILITOT 1.0  PROT 7.8  ALBUMIN 3.0*    Urine analysis:    Component Value Date/Time   COLORURINE YELLOW 03/10/2022 0853   APPEARANCEUR CLEAR 03/10/2022 0853   LABSPEC 1.005 03/10/2022 0853   PHURINE 6.0 03/10/2022 0853   GLUCOSEU NEGATIVE 03/10/2022 0853   HGBUR SMALL (A) 03/10/2022 0853   BILIRUBINUR NEGATIVE 03/10/2022 0853   KETONESUR NEGATIVE 03/10/2022 0853   PROTEINUR NEGATIVE 03/10/2022 0853   UROBILINOGEN 1.0 07/24/2014 1605   NITRITE NEGATIVE 03/10/2022 0853   LEUKOCYTESUR NEGATIVE 03/10/2022 0853    Last A1C:  Lab Results  Component Value Date   HGBA1C 7.6 (H) 01/23/2022     Radiologic Exams on Admission:   CT Angio Chest PE W and/or Wo Contrast  Result Date: 04/20/2023 CLINICAL DATA:  Pulmonary embolism (PE) suspected, high prob EXAM: CT ANGIOGRAPHY CHEST WITH CONTRAST TECHNIQUE: Multidetector CT imaging of the chest was performed using the standard protocol during bolus administration of intravenous contrast. Multiplanar CT image reconstructions and MIPs were obtained to evaluate the vascular anatomy. RADIATION DOSE REDUCTION: This exam was performed according to the departmental dose-optimization program which includes automated exposure control, adjustment of the mA and/or kV according to patient size and/or use of  iterative reconstruction technique. CONTRAST:  75mL OMNIPAQUE IOHEXOL 350 MG/ML SOLN COMPARISON:  CTA CAP, 04/19/2022.  Chest XR, earlier same day. FINDINGS: Cardiovascular: Satisfactory opacification of the pulmonary arteries to the segmental level. No segmental or larger pulmonary embolus. Normal heart size. No pericardial effusion. Mediastinum/Nodes: Similar appearance of mediastinal and BILATERAL hilar adenopathy since 03/2022. No enlarged axillary lymph nodes. Thyroid gland, trachea, and esophagus demonstrate no significant  findings. Lungs/Pleura: Multifocal/innumerable, basilar-predominant tree-in-bud nodularities. Peribronchial wall thickening. Findings improved in appearance since 04/19/2022 comparison. Bibasilar dependent consolidations. No pleural effusion or pneumothorax. Upper Abdomen: No acute abnormality. Musculoskeletal: Mild, chronic-appearing T7 superior endplate deformity. No acute chest wall abnormality. No acute or significant osseous findings. Review of the MIP images confirms the above findings. IMPRESSION: 1. No segmental or larger pulmonary embolus. 2. Innumerable multifocal basilar-predominant tree-in-bud nodularities, most likely consistent with chronic inflammatory change. 3. Mediastinal and hilar adenopathy, unchanged since 03/2022. 4. Chronic T7 mild superior endplate compression deformity. Additional incidental, chronic and senescent findings as above. Roanna Banning, MD [facility]iology Electronically Signed   By: Roanna Banning M.D.   On: 04/20/2023 13:05   DG Chest Port 1 View  Result Date: 04/20/2023 CLINICAL DATA:  Shortness of breath over the last few days. Asthma and COPD. EXAM: PORTABLE CHEST 1 VIEW COMPARISON:  04/19/2022 FINDINGS: Heart size remains normal. Mediastinal shadows are normal. Chronic interstitial lung markings. Worsened patchy density in both lower lobes consistent with bibasilar bronchopneumonia. No lobar  consolidation or collapse. No measurable effusion. IMPRESSION: Worsened patchy density in both lower lobes consistent with bibasilar bronchopneumonia. Electronically Signed   By: Paulina Fusi M.D.   On: 04/20/2023 09:55    EKG:   Independently reviewed.  Orders placed or performed during the hospital encounter of 04/20/23   ED EKG   ED EKG   EKG 12-Lead   EKG 12-Lead   EKG 12-Lead   ---------------------------------------------------------------------------------------------------------------------------------------    Assessment / Plan:   Principal Problem:   Acute on chronic respiratory failure (HCC) Active Problems:   Sepsis (HCC)   Anxiety state   Chronic pain syndrome   Hypokalemia   Hyponatremia   Atypical chest pain   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    DM (diabetes mellitus) (HCC)   GERD (gastroesophageal reflux disease)   Essential hypertension   Hyperlipidemia   Assessment and Plan: * Acute on chronic respiratory failure (HCC) - Acute on chronic respiratory failure-due to multifocal pneumonia with underlying COPD, COPD exacerbation -Baseline O2 demand 3 L -Admitted to stepdown unit  -POA: CPAP/BiPAP>>> weaning down to O2 via nasal cannula, currently on 4 L of oxygen, satting -Patient has received IV Solu-Medrol, IV antibiotics, DuoNeb bronchodilator treatments -Will continue with the steroids, IV antibiotics, DuoNeb bronchodilators scheduled and as needed ABG    Component Value Date/Time   PHART 7.314 (L) 11/27/2019 1205   PCO2ART 85.8 (HH) 11/27/2019 1205   PO2ART 88.5 11/27/2019 1205   HCO3 34.3 (H) 04/20/2023 1024   TCO2 75.0 05/30/2016 1500   O2SAT 98.8 04/20/2023 1024   -Will monitor closely   Sepsis (HCC) - Meaning SIRS and early signs of sepsis: Blood pressure 111/81, pulse (!) 108, temperature 97.9 F RR 17, SpO2 96 % off BiPAP on 4 L -Initially with acute on chronic respiratory failure, leukocytosis, tachycardic -WBC:  32.2 -Obtaining lactic acid, Procalcitonin -Initiating sepsis protocol, IV fluid resuscitation, IV antibiotics,    Atypical chest pain -Pleuritic due to cough, pneumonia, COPD exacerbation - Troponin 7, 6 -No changes in EKG -Treating underlying causes, along with a PR nitroglycerin aspirin  Hyponatremia - Sodium at 128 (Baseline 130s) -Likely due to sepsis, pneumonia,  -Will monitor, continue IV fluid hydration  Hypokalemia -Potassium 3.1, magnesium 1.7 Repleting potassium orally and magnesium 2 g IV  Chronic pain syndrome - Home medication include Robaxin, rec Requip, Tylenol, -Monitor closely, continue Requip as needed analgesics  Anxiety state -  Currently stable,  - As needed Xanax  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  - Chronically O2 dependent 3 L -Continue IV steroids, DuoNeb bronchodilators  DM (diabetes mellitus) (HCC) - Resuming home medication of glipizide, long-acting insulin -Anticipating hyperglycemia with IV steroids -Monitoring CBG q. ACHS with Isai coverage -Blood A1c 7.6 on March 2023  GERD (gastroesophageal reflux disease) - Continue PPI  Hyperlipidemia - Continue statins  Essential hypertension - Stable not on any hypertensive medication at home -Will monitor closely anticipation of hypotension with sepsis    Consults called:  None -------------------------------------------------------------------------------------------------------------------------------------------- DVT prophylaxis:  heparin injection 5,000 Units Start: 04/20/23 1430 TED hose Start: 04/20/23 1417 SCDs Start: 04/20/23 1417   Code Status:   Code Status: Full Code   Admission status: Patient will be admitted as Inpatient, with a greater than 2 midnight length of stay. Level of care: Stepdown   Family Communication:  none at bedside  (The above findings and plan of care has been discussed with patient in detail, the patient expressed understanding and  agreement of above plan)  --------------------------------------------------------------------------------------------------------------------------------------------------  Disposition Plan:  Anticipated 1-2 days Status is: Inpatient Remains inpatient appropriate because: Needing IV antibiotics treatment for sepsis, acute respiratory failure    ----------------------------------------------------------------------------------------------------------------------------------------------------  Time spent:  46  Min.  Of critical time was spent seeing and evaluating the patient, reviewing all medical records, drawn plan of care.  And admitted to stepdown unit  SIGNED: Kendell Bane, MD, FHM. FAAFP. [place] [facility], Pager  (Please use [email or web address] to page/ or secure chat through epic) If 7PM-7AM, please contact night-coverage [email or web address],  04/20/2023, 2:53 PM

## 2023-04-20 NOTE — Progress Notes (Signed)
Patient is currently on 4L, with a sat of 96%, no distress noted.  Bipap order is prn.  Will continue to monitor.

## 2023-04-20 NOTE — Assessment & Plan Note (Signed)
-   Resuming home medication of glipizide, long-acting insulin -Anticipating hyperglycemia with IV steroids -Monitoring CBG q. ACHS with Isai coverage -Blood A1c 7.6 on March 2023

## 2023-04-20 NOTE — Assessment & Plan Note (Signed)
-   Sodium at 128 (Baseline 130s) -Likely due to sepsis, pneumonia,  -Will monitor, continue IV fluid hydration

## 2023-04-20 NOTE — ED Triage Notes (Signed)
[facility] reports pt coming from home c/o sob for the past few days. Upon EMS arrival pt pulse ox 86% on 3L [place]. EMS placed pt on CPAP and pulse ox 91%. Advised crackles on assessment. Given 1 nitro enroute. Pt has had a cold for the past few days as well.

## 2023-04-21 DIAGNOSIS — J181 Lobar pneumonia, unspecified organism: Secondary | ICD-10-CM

## 2023-04-21 DIAGNOSIS — A419 Sepsis, unspecified organism: Secondary | ICD-10-CM

## 2023-04-21 DIAGNOSIS — E871 Hypo-osmolality and hyponatremia: Secondary | ICD-10-CM

## 2023-04-21 DIAGNOSIS — J441 Chronic obstructive pulmonary disease with (acute) exacerbation: Secondary | ICD-10-CM | POA: Diagnosis not present

## 2023-04-21 DIAGNOSIS — J9621 Acute and chronic respiratory failure with hypoxia: Secondary | ICD-10-CM | POA: Diagnosis not present

## 2023-04-21 LAB — BASIC METABOLIC PANEL
Anion gap: 12 (ref 5–15)
BUN: 20 mg/dL (ref 6–20)
CO2: 30 mmol/L (ref 22–32)
Calcium: 8.7 mg/dL — ABNORMAL LOW (ref 8.9–10.3)
Chloride: 88 mmol/L — ABNORMAL LOW (ref 98–111)
Creatinine, Ser: 0.48 mg/dL (ref 0.44–1.00)
GFR, Estimated: >60 mL/min (ref >60)
Glucose, Bld: 286 mg/dL — ABNORMAL HIGH (ref 70–99)
Potassium: 3.9 mmol/L (ref 3.5–5.1)
Sodium: 130 mmol/L — ABNORMAL LOW (ref 135–145)

## 2023-04-21 LAB — CBC
HCT: 35.3 % — ABNORMAL LOW (ref 36.0–46.0)
Hemoglobin: 11 g/dL — ABNORMAL LOW (ref 12.0–15.0)
MCH: 26.8 pg (ref 26.0–34.0)
MCHC: 31.2 g/dL (ref 30.0–36.0)
MCV: 86.1 fL (ref 80.0–100.0)
Platelets: 287 10*3/uL (ref 150–400)
RBC: 4.1 MIL/uL (ref 3.87–5.11)
RDW: 13.3 % (ref 11.5–15.5)
WBC: 20.9 10*3/uL — ABNORMAL HIGH (ref 4.0–10.5)
nRBC: 0 % (ref 0.0–0.2)

## 2023-04-21 LAB — CULTURE, BLOOD (ROUTINE X 2): Culture: NO GROWTH

## 2023-04-21 LAB — PROTIME-INR
INR: 1.2 (ref 0.8–1.2)
Prothrombin Time: 15.1 seconds (ref 11.4–15.2)

## 2023-04-21 LAB — HEMOGLOBIN A1C
Hgb A1c MFr Bld: 12 % — ABNORMAL HIGH (ref 4.8–5.6)
Mean Plasma Glucose: 298 mg/dL

## 2023-04-21 LAB — GLUCOSE, CAPILLARY
Glucose-Capillary: 270 mg/dL — ABNORMAL HIGH (ref 70–99)
Glucose-Capillary: 259 mg/dL — ABNORMAL HIGH (ref 70–99)
Glucose-Capillary: 326 mg/dL — ABNORMAL HIGH (ref 70–99)
Glucose-Capillary: 172 mg/dL — ABNORMAL HIGH (ref 70–99)

## 2023-04-21 LAB — APTT: aPTT: 28 seconds (ref 24–36)

## 2023-04-21 LAB — CULTURE, RESPIRATORY W GRAM STAIN

## 2023-04-21 MED ORDER — BUDESONIDE 0.5 MG/2ML IN SUSP
0.5000 mg | Freq: Two times a day (BID) | RESPIRATORY_TRACT | Status: DC
  Administered 2023-04-21 – 2023-04-23 (×5): 0.5 mg via RESPIRATORY_TRACT
  Filled 2023-04-21 (×5): qty 2

## 2023-04-21 MED ORDER — INSULIN ASPART 100 UNIT/ML IJ SOLN: 0.0000 [IU] | Freq: Every day | INTRAMUSCULAR | Status: DC

## 2023-04-21 MED ORDER — INSULIN ASPART 100 UNIT/ML IJ SOLN
5.0000 [IU] | Freq: Three times a day (TID) | INTRAMUSCULAR | Status: DC
  Administered 2023-04-21 – 2023-04-22 (×4): 5 [IU] via SUBCUTANEOUS

## 2023-04-21 MED ORDER — ARFORMOTEROL TARTRATE 15 MCG/2ML IN NEBU
15.0000 ug | INHALATION_SOLUTION | Freq: Two times a day (BID) | RESPIRATORY_TRACT | Status: DC
  Administered 2023-04-21 – 2023-04-23 (×5): 15 ug via RESPIRATORY_TRACT
  Filled 2023-04-21 (×5): qty 2

## 2023-04-21 MED ORDER — ORAL CARE MOUTH RINSE: 15.0000 mL | OROMUCOSAL | Status: DC | PRN

## 2023-04-21 MED ORDER — SODIUM CHLORIDE 0.9 % IV SOLN: INTRAVENOUS | Status: AC

## 2023-04-21 MED ORDER — METHYLPREDNISOLONE SODIUM SUCC 125 MG IJ SOLR
60.0000 mg | Freq: Two times a day (BID) | INTRAMUSCULAR | Status: DC
  Administered 2023-04-21 – 2023-04-22 (×3): 60 mg via INTRAVENOUS
  Filled 2023-04-21 (×4): qty 2

## 2023-04-21 MED ORDER — INSULIN ASPART 100 UNIT/ML IJ SOLN
0.0000 [IU] | Freq: Three times a day (TID) | INTRAMUSCULAR | Status: DC
  Administered 2023-04-21 (×3): 5 [IU] via SUBCUTANEOUS
  Administered 2023-04-22 (×2): 7 [IU] via SUBCUTANEOUS
  Administered 2023-04-22: 3 [IU] via SUBCUTANEOUS
  Administered 2023-04-23: 5 [IU] via SUBCUTANEOUS

## 2023-04-21 NOTE — Evaluation (Signed)
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  Felt warm had some chills.  EMS evaluation on the patient hypoxic satting 86% on 3 L of oxygen, she was placed on CPAP, 1 sublingual nitroglycerin was given.   Clinical Impression  Patient demonstrates good return for bed mobility and transferring to chair without AD, but slightly unsteady when taking steps away from bedside requiring use of RW for safety.  Patient able to ambulate in hallway, but requires frequent standing rest break due to fatigue and SpO2 dropping from 93% to 85% while on 4 LPM and had to return to room due to fatigue and SOB.  Patient tolerated sitting up in chair after therapy.  Patient will benefit from continued skilled physical therapy in hospital and recommended venue below to increase strength, balance, endurance for safe ADLs and gait.         Recommendations for follow up therapy are one component of a multi-disciplinary discharge planning process, led by the attending physician.  Recommendations may be updated based on patient status, additional functional criteria and insurance authorization.  Follow Up Recommendations       Assistance Recommended at Discharge Set up Supervision/Assistance  Patient can return home with the following  A little help with walking and/or transfers;A little help with bathing/dressing/bathroom;Help with stairs or ramp for entrance;Assistance  with cooking/housework    Equipment Recommendations Rolling walker (2 wheels)  Recommendations for Other Services       Functional Status Assessment Patient has had a recent decline in their functional status and demonstrates the ability to make significant improvements in function in a reasonable and predictable amount of time.     Precautions / Restrictions Precautions Precautions: Fall Restrictions Weight Bearing Restrictions: No      Mobility  Bed Mobility Overal bed mobility: Modified Independent                  Transfers Overall transfer level: Needs assistance Equipment used: Rolling walker (2 wheels) Transfers: Sit to/from Stand, Bed to chair/wheelchair/BSC Sit to Stand: Modified independent (Device/Increase time), Supervision   Step pivot transfers: Modified independent (Device/Increase time), Supervision       General transfer comment: slightly labored movement    Ambulation/Gait Ambulation/Gait assistance: Supervision, Min guard Gait Distance (Feet): 65 Feet Assistive device: Rolling walker (2 wheels) Gait Pattern/deviations: Decreased step length - left, Decreased stance time - right, Decreased stride length Gait velocity: decreased     General Gait Details: slow labored cadence requiring use of RW due to c/o weakness, no loss of balance, on 4 LPM O2 with SpO2 dropping from 92% to 84%, limited mostly due to fatige and mild SOB  Stairs            Wheelchair Mobility    Modified Rankin (Stroke Patients Only)       Balance Overall balance assessment: Needs assistance Sitting-balance support: Feet supported, No upper extremity supported Sitting balance-Leahy Scale: Good Sitting balance -  Comments: seated at EOB   Standing balance support: During functional activity, No upper extremity supported Standing balance-Leahy Scale: Fair Standing balance comment: fair/good using RW                             Pertinent  Vitals/Pain Pain Assessment Pain Assessment: 0-10 Pain Score: 8  Pain Location: L lung Pain Descriptors / Indicators: Dull, Sharp Pain Intervention(s): Limited activity within patient's tolerance, Monitored during session, Repositioned    Home Living Family/patient expects to be discharged to:: Private residence Living Arrangements: Children;Other relatives Available Help at Discharge: Family;Available 24 hours/day Type of Home: House Home Access: Stairs to enter Entrance Stairs-Rails: Can reach both Entrance Stairs-Number of Steps: 4   Home Layout: One level Home Equipment: Cane - quad;Wheelchair - manual      Prior Function Prior Level of Function : Needs assist       Physical Assist : ADLs (physical);Mobility (physical) Mobility (physical): Bed mobility;Transfers;Gait;Stairs   Mobility Comments: [place] ambulator without AD ADLs Comments: Independent ADL's ; assist for IADL's.     Hand Dominance   Dominant Hand: Right    Extremity/Trunk Assessment   Upper Extremity Assessment Upper Extremity Assessment: Defer to OT evaluation    Lower Extremity Assessment Lower Extremity Assessment: Generalized weakness    Cervical / Trunk Assessment Cervical / Trunk Assessment: Normal  Communication   Communication: No difficulties  Cognition Arousal/Alertness: Awake/alert Behavior During Therapy: WFL for tasks assessed/performed Overall Cognitive Status: Within Functional Limits for tasks assessed                                          General Comments      Exercises     Assessment/Plan    PT Assessment Patient needs continued PT services  PT Problem List Decreased strength;Decreased activity tolerance;Decreased balance;Decreased mobility       PT Treatment Interventions DME instruction;Gait training;Stair training;Functional mobility training;Therapeutic activities;Therapeutic exercise;Patient/family education;Balance training    PT Goals  (Current goals can be found in the Care Plan section)  Acute Rehab PT Goals Patient Stated Goal: return home with family to assist PT Goal Formulation: With patient Time For Goal Achievement: 04/28/23 Potential to Achieve Goals: Good    Frequency Min 3X/week     Co-evaluation PT/OT/SLP Co-Evaluation/Treatment: Yes Reason for Co-Treatment: To address functional/ADL transfers PT goals addressed during session: Mobility/safety with mobility;Balance;Proper use of DME         AM-PAC PT "6 Clicks" Mobility  Outcome Measure Help needed turning from your back to your side while in a flat bed without using bedrails?: None Help needed moving from lying on your back to sitting on the side of a flat bed without using bedrails?: None Help needed moving to and from a bed to a chair (including a wheelchair)?: None Help needed standing up from a chair using your arms (e.g., wheelchair or bedside chair)?: None Help needed to walk in hospital room?: A Little Help needed climbing 3-5 steps with a railing? : A Lot 6 Click Score: 21    End of Session Equipment Utilized During Treatment: Oxygen Activity Tolerance: Patient tolerated treatment well;Patient limited by fatigue Patient left: in chair;with call bell/phone within reach Nurse Communication: Mobility status PT Visit Diagnosis: Unsteadiness on feet (R26.81);Other abnormalities of gait and mobility (R26.89);Muscle weakness (generalized) (M62.81)    Time: 1610-9604  PT Time Calculation (min) (ACUTE ONLY): 20 min   Charges:   PT Evaluation $PT Eval Moderate Complexity: 1 Mod PT Treatments $Therapeutic Activity: 8-22 mins        2:11 PM, 04/21/23 Ocie Bob, MPT Physical Therapist with [facility] 336 [phone] office [phone] mobile phone

## 2023-04-21 NOTE — Plan of Care (Signed)
  Problem: Acute Rehab OT Goals (only OT should resolve) Goal: Pt. Will Perform Grooming Flowsheets (Taken 04/21/2023 0953) Pt Will Perform Grooming:  with modified independence  standing Goal: Pt. Will Transfer To Toilet Flowsheets (Taken 04/21/2023 [phone]) Pt Will Transfer to Toilet:  with modified independence  ambulating Goal: Pt/Caregiver Will Perform Home Exercise Program Flowsheets (Taken 04/21/2023 [phone]) Pt/caregiver will Perform Home Exercise Program:  Increased strength  Both right and left upper extremity  Independently   Arye Weyenberg OT, MOT

## 2023-04-21 NOTE — Plan of Care (Signed)
  Problem: Acute Rehab PT Goals(only PT should resolve) Goal: Pt Will Go Supine/Side To Sit Outcome: Progressing Flowsheets (Taken 04/21/2023 1413) Pt will go Supine/Side to Sit:  Independently  with modified independence Goal: Patient Will Transfer Sit To/From Stand Outcome: Progressing Flowsheets (Taken 04/21/2023 1413) Patient will transfer sit to/from stand:  Independently  with modified independence Goal: Pt Will Transfer Bed To Chair/Chair To Bed Outcome: Progressing Flowsheets (Taken 04/21/2023 1413) Pt will Transfer Bed to Chair/Chair to Bed:  with modified independence  Independently Goal: Pt Will Ambulate Outcome: Progressing Flowsheets (Taken 04/21/2023 1413) Pt will Ambulate:  100 feet  with modified independence  with supervision  with rolling walker  with least restrictive assistive device   2:13 PM, 04/21/23 Ocie Bob, MPT Physical Therapist with [facility] [phone] office [phone] mobile phone

## 2023-04-21 NOTE — Progress Notes (Signed)
PROGRESS NOTE  [name]:[id number] DOB: [birth year] DOA: 04/20/2023 PCP: Kara Pacer, NP  Brief History:  57 year old female with a history of chronic respiratory failure on 3 L, COPD, hypertension, hyperlipidemia, diabetes mellitus type 2, tobacco abuse presenting with shortness of breath that began on 04/19/2023.  The patient also had pleuritic type left chest pain around her breast.  Is been complaining of a cough with yellow sputum.  She denies any hemoptysis.  She has had some subjective fevers and chills.  She states that she has not smoked about a week.  She has been smoking about 1/2 pack/day prior to this.  Because of worsening shortness of breath, EMS was activated.  The patient was noted to have oxygen saturation 85% on her home 3 L.  She was placed on CPAP initially.  In the ED, the patient was able to be weaned off of CPAP. She was afebrile and hemodynamically stable albeit with soft blood pressures.  WBC 32.2, hemoglobin 13.2, platelets 300.  VBG showed 7.5 1/43/133/34.  The patient was started on steroids and bronchodilators.  She was started on ceftriaxone and azithromycin. CTA chest was negative for PE.  It showed innumerable multifocal tree-in-bud nodularities.  There is unchanged mediastinal and infrahilar lymphadenopathy since May 2023.  There is bibasilar consolidations.   Assessment/Plan: Acute on chronic respiratory failure with hypoxia -Chronically on 3 L nasal cannula -Presented with hypoxia on 3 L, initially on CPAP -Secondary COPD exacerbation and pneumonia -Wean oxygen for saturation greater 92%  Sepsis -Present on admission -Presented with leukocytosis and tachycardia -Secondary to pneumonia -Lactic acid peaked 1.3 -Follow blood cultures -Continue ceftriaxone and azithromycin  Lobar pneumonia -Continue ceftriaxone and azithromycin -Procalcitonin 11.77 -Urine Streptococcus pneumoniae antigen negative  COPD exacerbation -Start  Pulmicort -Start Brovana -Continue Xopenex and Atrovent -Start Solu-Medrol  Uncontrolled diabetes mellitus type 2 with hyperglycemia -04/20/2023 hemoglobin A1c 12.0 -Continue Semglee 25 units -Add NovoLog 5 units with meals -Continue sliding scale  Pleuritic chest pain -Troponins 7>> 6 -04/20/2023 CTA chest negative PE  Mixed hyperlipidemia -Continue statin  Hypokalemia -Repleted -Check magnesium--1.7  Hyponatremia -Secondary to volume depletion -Continue IV fluids>> improving          Family Communication:  no  Family at bedside  Consultants:  none  Code Status:  FULL  DVT Prophylaxis:  [place] Heparin  Procedures: As Listed in Progress Note Above  Antibiotics: Ceftriaxone 5/28>> Azithro 5/28>>     Subjective: Patient denies fevers, chills, headache, nausea, vomiting, diarrhea, abdominal pain, dysuria, hematuria, hematochezia, and melena.   Objective: Vitals:   04/21/23 0910 04/21/23 0911 04/21/23 0915 04/21/23 0920  BP:      Pulse: (!) 107 (!) 115 (!) 113 (!) 101  Resp: (!) 22 15 19  (!) 21  Temp:      TempSrc:      SpO2: (!) 83% (!) 82% (!) 85% 92%  Weight:      Height:        Intake/Output Summary (Last 24 hours) at 04/21/2023 1004 Last data filed at 04/21/2023 0900 Gross per 24 hour  Intake 2391.09 ml  Output 1 ml  Net 2390.09 ml   Weight change:  Exam:  General:  Pt is alert, follows commands appropriately, not in acute distress HEENT: No icterus, No thrush, No neck mass, [place]/AT Cardiovascular: RRR, S1/S2, no rubs, no gallops Respiratory: Bilateral rales.bibasilar wheeze Abdomen: Soft/+BS, non tender, non distended, no guarding Extremities: No edema, No lymphangitis,  No petechiae, No rashes, no synovitis   Data Reviewed: I have personally reviewed following labs and imaging studies Basic Metabolic Panel: Recent Labs  Lab 04/20/23 0940 04/20/23 1446 04/21/23 0445  NA 128*  --  130*  K 3.1*  --  3.9  CL 85*  --  88*  CO2 27   --  30  GLUCOSE 247*  --  286*  BUN 27*  --  20  CREATININE 0.56 0.49 0.48  CALCIUM 9.0  --  8.7*  MG 1.7  --   --   PHOS  --  2.4*  --    Liver Function Tests: Recent Labs  Lab 04/20/23 0940  AST 14*  ALT 10  ALKPHOS 106  BILITOT 1.0  PROT 7.8  ALBUMIN 3.0*   No results for input(s): "LIPASE", "AMYLASE" in the last 168 hours. No results for input(s): "AMMONIA" in the last 168 hours. Coagulation Profile: Recent Labs  Lab 04/20/23 1509 04/21/23 0445  INR 1.2 1.2   CBC: Recent Labs  Lab 04/20/23 0940 04/20/23 1446 04/21/23 0445  WBC 32.2* 26.3* 20.9*  NEUTROABS 28.3*  --   --   HGB 13.2 11.9* 11.0*  HCT 40.6 37.0 35.3*  MCV 82.7 84.5 86.1  PLT 300 274 287   Cardiac Enzymes: No results for input(s): "CKTOTAL", "CKMB", "CKMBINDEX", "TROPONINI" in the last 168 hours. BNP: Invalid input(s): "POCBNP" CBG: Recent Labs  Lab 04/20/23 1602 04/20/23 2110 04/21/23 0745  GLUCAP 361* 382* 270*   HbA1C: Recent Labs    04/20/23 1446  HGBA1C 12.0*   Urine analysis:    Component Value Date/Time   COLORURINE YELLOW 03/10/2022 0853   APPEARANCEUR CLEAR 03/10/2022 0853   LABSPEC 1.005 03/10/2022 0853   PHURINE 6.0 03/10/2022 0853   GLUCOSEU NEGATIVE 03/10/2022 0853   HGBUR SMALL (A) 03/10/2022 0853   BILIRUBINUR NEGATIVE 03/10/2022 0853   KETONESUR NEGATIVE 03/10/2022 0853   PROTEINUR NEGATIVE 03/10/2022 0853   UROBILINOGEN 1.0 07/24/2014 1605   NITRITE NEGATIVE 03/10/2022 0853   LEUKOCYTESUR NEGATIVE 03/10/2022 0853   Sepsis Labs: @LABRCNTIP (procalcitonin:4,lacticidven:4) ) Recent Results (from the past 240 hour(s))  Resp panel by RT-PCR (RSV, Flu A&B, Covid) Anterior Nasal Swab     Status: None   Collection Time: 04/20/23  9:34 AM   Specimen: Anterior Nasal Swab  Result Value Ref Range Status   SARS Coronavirus 2 by RT PCR NEGATIVE NEGATIVE Final    Comment: (NOTE) SARS-CoV-2 target nucleic acids are NOT DETECTED.  The SARS-CoV-2 RNA is generally  detectable in upper respiratory specimens during the acute phase of infection. The lowest concentration of SARS-CoV-2 viral copies this assay can detect is 138 copies/mL. A negative result does not preclude SARS-Cov-2 infection and should not be used as the sole basis for treatment or other patient management decisions. A negative result may occur with  improper specimen collection/handling, submission of specimen other than nasopharyngeal swab, presence of viral mutation(s) within the areas targeted by this assay, and inadequate number of viral copies(<138 copies/mL). A negative result must be combined with clinical observations, patient history, and epidemiological information. The expected result is Negative.  Fact Sheet for Patients:  BloggerCourse.com  Fact Sheet for Healthcare Providers:  SeriousBroker.it  This test is no t yet approved or cleared by the [place] FDA and  has been authorized for detection and/or diagnosis of SARS-CoV-2 by FDA under an Emergency Use Authorization (EUA). This EUA will remain  in effect (meaning this test can be used) for the duration  of the COVID-19 declaration under Section 564(b)(1) of the Act, 21 U.S.C.section 360bbb-3(b)(1), unless the authorization is terminated  or revoked sooner.       Influenza A by PCR NEGATIVE NEGATIVE Final   Influenza B by PCR NEGATIVE NEGATIVE Final    Comment: (NOTE) The Xpert Xpress SARS-CoV-2/FLU/RSV plus assay is intended as an aid in the diagnosis of influenza from Nasopharyngeal swab specimens and should not be used as a sole basis for treatment. Nasal washings and aspirates are unacceptable for Xpert Xpress SARS-CoV-2/FLU/RSV testing.  Fact Sheet for Patients: BloggerCourse.com  Fact Sheet for Healthcare Providers: SeriousBroker.it  This test is not yet approved or cleared by the [place] FDA  and has been authorized for detection and/or diagnosis of SARS-CoV-2 by FDA under an Emergency Use Authorization (EUA). This EUA will remain in effect (meaning this test can be used) for the duration of the COVID-19 declaration under Section 564(b)(1) of the Act, 21 U.S.C. section 360bbb-3(b)(1), unless the authorization is terminated or revoked.     Resp Syncytial Virus by PCR NEGATIVE NEGATIVE Final    Comment: (NOTE) Fact Sheet for Patients: BloggerCourse.com  Fact Sheet for Healthcare Providers: SeriousBroker.it  This test is not yet approved or cleared by the [place] FDA and has been authorized for detection and/or diagnosis of SARS-CoV-2 by FDA under an Emergency Use Authorization (EUA). This EUA will remain in effect (meaning this test can be used) for the duration of the COVID-19 declaration under Section 564(b)(1) of the Act, 21 U.S.C. section 360bbb-3(b)(1), unless the authorization is terminated or revoked.  Performed at [facility], [address] [place], [place] [address]   Blood culture (routine x 2)     Status: None (Preliminary result)   Collection Time: 04/20/23 10:24 AM   Specimen: BLOOD RIGHT HAND  Result Value Ref Range Status   Specimen Description   Final    BLOOD RIGHT HAND BOTTLES DRAWN AEROBIC AND ANAEROBIC   Special Requests   Final    Blood Culture results may not be optimal due to an excessive volume of blood received in culture bottles   Culture   Final    NO GROWTH < 24 HOURS Performed at [facility], [address], [place], [place] [address]    Report Status PENDING  Incomplete  Blood culture (routine x 2)     Status: None (Preliminary result)   Collection Time: 04/20/23 10:24 AM   Specimen: BLOOD LEFT HAND  Result Value Ref Range Status   Specimen Description   Final    BLOOD LEFT HAND BOTTLES DRAWN AEROBIC AND ANAEROBIC   Special Requests   Final    Blood Culture results may not  be optimal due to an excessive volume of blood received in culture bottles   Culture   Final    NO GROWTH < 24 HOURS Performed at [facility], [address], [place], [place] [address]    Report Status PENDING  Incomplete  MRSA Next Gen by PCR, Nasal     Status: Abnormal   Collection Time: 04/20/23  3:52 PM   Specimen: Nasal Mucosa; Nasal Swab  Result Value Ref Range Status   MRSA by PCR Next Gen DETECTED (A) NOT DETECTED Final    Comment: RESULT CALLED TO, READ BACK BY AND VERIFIED WITH: BRITTANY FOLEY @ 1846 ON 04/20/2023 C VARNER (NOTE) The GeneXpert MRSA Assay (FDA approved for NASAL specimens only), is one component of a comprehensive MRSA colonization surveillance program. It is not intended  to diagnose MRSA infection nor to guide or monitor treatment for MRSA infections. Test performance is not FDA approved in patients less than 69 years old. Performed at [facility], [address] [place], [place] [address]   Expectorated Sputum Assessment w Gram Stain, Rflx to Resp Cult     Status: None   Collection Time: 04/20/23  4:25 PM   Specimen: Expectorated Sputum  Result Value Ref Range Status   Specimen Description EXPECTORATED SPUTUM  Final   Special Requests NONE  Final   Sputum evaluation   Final    THIS SPECIMEN IS ACCEPTABLE FOR SPUTUM CULTURE Performed at [facility], [address], [place], [place] [address]    Report Status 04/20/2023 FINAL  Final  Culture, Respiratory w Gram Stain     Status: None (Preliminary result)   Collection Time: 04/20/23  4:25 PM  Result Value Ref Range Status   Specimen Description   Final    EXPECTORATED SPUTUM Performed at [facility], [address] [place], [place] [address]    Special Requests   Final    NONE Reflexed from [id number] Performed at [facility], [address], [place], [place] [address]    Gram Stain   Final    ABUNDANT WBC PRESENT, PREDOMINANTLY PMN FEW GRAM POSITIVE COCCI IN PAIRS IN CHAINS FEW GRAM NEGATIVE  COCCOBACILLI INTRACELLULAR Performed at [facility], [address] [place], [place] [address]    Culture PENDING  Incomplete   Report Status PENDING  Incomplete     Scheduled Meds:  acidophilus  1 capsule Oral Daily   arformoterol  15 mcg Nebulization BID   budesonide (PULMICORT) nebulizer solution  0.5 mg Nebulization BID   Chlorhexidine Gluconate Cloth  6 each Topical Daily   Chlorhexidine Gluconate Cloth  6 each Topical Q0600   heparin  5,000 Units Subcutaneous Q8H   insulin aspart  0-5 Units Subcutaneous QHS   insulin aspart  0-9 Units Subcutaneous TID WC   insulin aspart  5 Units Subcutaneous TID WC   insulin glargine-yfgn  25 Units Subcutaneous QHS   ipratropium  0.5 mg Nebulization Q6H WA   levalbuterol  0.63 mg Nebulization Q6H   loratadine  10 mg Oral Daily   methylPREDNISolone (SOLU-MEDROL) injection  60 mg Intravenous Q12H   mupirocin ointment  1 Application Nasal BID   pantoprazole  40 mg Oral Daily   rOPINIRole  1 mg Oral QHS   rosuvastatin  20 mg Oral Daily   Continuous Infusions:  azithromycin 500 mg (04/21/23 1003)   cefTRIAXone (ROCEPHIN)  IV 2 g (04/21/23 0836)   lactated ringers 150 mL/hr (04/20/23 2231)    Procedures/Studies: CT Angio Chest PE W and/or Wo Contrast  Result Date: 04/20/2023 CLINICAL DATA:  Pulmonary embolism (PE) suspected, high prob EXAM: CT ANGIOGRAPHY CHEST WITH CONTRAST TECHNIQUE: Multidetector CT imaging of the chest was performed using the standard protocol during bolus administration of intravenous contrast. Multiplanar CT image reconstructions and MIPs were obtained to evaluate the vascular anatomy. RADIATION DOSE REDUCTION: This exam was performed according to the departmental dose-optimization program which includes automated exposure control, adjustment of the mA and/or kV according to patient size and/or use of iterative reconstruction technique. CONTRAST:  75mL OMNIPAQUE IOHEXOL 350 MG/ML SOLN COMPARISON:  CTA CAP,  04/19/2022.  Chest XR, earlier same day. FINDINGS: Cardiovascular: Satisfactory opacification of the pulmonary arteries to the segmental level. No segmental or larger pulmonary embolus. Normal heart size. No pericardial effusion. Mediastinum/Nodes: Similar appearance of mediastinal  and BILATERAL hilar adenopathy since 03/2022. No enlarged axillary lymph nodes. Thyroid gland, trachea, and esophagus demonstrate no significant findings. Lungs/Pleura: Multifocal/innumerable, basilar-predominant tree-in-bud nodularities. Peribronchial wall thickening. Findings improved in appearance since 04/19/2022 comparison. Bibasilar dependent consolidations. No pleural effusion or pneumothorax. Upper Abdomen: No acute abnormality. Musculoskeletal: Mild, chronic-appearing T7 superior endplate deformity. No acute chest wall abnormality. No acute or significant osseous findings. Review of the MIP images confirms the above findings. IMPRESSION: 1. No segmental or larger pulmonary embolus. 2. Innumerable multifocal basilar-predominant tree-in-bud nodularities, most likely consistent with chronic inflammatory change. 3. Mediastinal and hilar adenopathy, unchanged since 03/2022. 4. Chronic T7 mild superior endplate compression deformity. Additional incidental, chronic and senescent findings as above. Roanna Banning, MD [facility] Radiology Electronically Signed   By: Roanna Banning M.D.   On: 04/20/2023 13:05   DG Chest Port 1 View  Result Date: 04/20/2023 CLINICAL DATA:  Shortness of breath over the last few days. Asthma and COPD. EXAM: PORTABLE CHEST 1 VIEW COMPARISON:  04/19/2022 FINDINGS: Heart size remains normal. Mediastinal shadows are normal. Chronic interstitial lung markings. Worsened patchy density in both lower lobes consistent with bibasilar bronchopneumonia. No lobar consolidation or collapse. No measurable effusion. IMPRESSION: Worsened patchy density in both lower lobes  consistent with bibasilar bronchopneumonia. Electronically Signed   By: Paulina Fusi M.D.   On: 04/20/2023 09:55    Catarina Hartshorn, DO  [facility]  If 7PM-7AM, please contact night-coverage [email or web address] Password [email or web address] 04/21/2023, 10:04 AM   LOS: 1 day

## 2023-04-21 NOTE — TOC Initial Note (Signed)
Transition of Care [facility]) - Initial/Assessment Note    Patient Details  Name: [name] MRN: [id number] Date of Birth: Apr 03, 1966  Transition of [facility]) CM/SW Contact:    Karn Cassis, LCSW Phone Number: 04/21/2023, 2:21 PM  Clinical Narrative: Pt admitted due to acute on chronic respiratory failure. Pt reports she lives with her 2 daughters and 4 grandchildren. She is fairly independent with ADLs. Pt requests rolling walker and shower chair. Choice offered and pt agrees to [employer]. Referred to [facility] with [employer]. MD notified for orders. Pt relies on Medicaid transportation to get to appointments. PT evaluated pt and recommend home health. Pt ambulated 65'. Due to insurance, LCSW offered outpatient PT/OT and pt agreeable, requesting [facility]. Referral made.                   Expected Discharge Plan: OP Rehab Barriers to Discharge: Continued Medical Work up   Patient Goals and CMS Choice Patient states their goals for this hospitalization and ongoing recovery are:: return home   Choice offered to / list presented to : Patient [place] ownership interest in [facility].provided to::  (n/a)    Expected Discharge Plan and Services In-house Referral: Clinical Social Work   Post Acute Care Choice: Durable Medical Equipment Living arrangements for the past 2 months: Single Family Home                 DME Arranged: Walker rolling, Other see comment (shower chair) DME Agency: [employer] Date DME Agency Contacted: 04/21/23 Time DME Agency Contacted: 1420 Representative spoke with at DME Agency: Barbara Cower            Prior Living Arrangements/Services Living arrangements for the past 2 months: Single Family Home Lives with:: Adult Children Patient language and need for interpreter reviewed:: Yes Do you feel safe going back to the place where you live?: Yes      Need for Family Participation in Patient Care: No (Comment)   Current home  services: DME (home O2, cane) Criminal Activity/Legal Involvement Pertinent to Current Situation/Hospitalization: No - Comment as needed  Activities of Daily Living Home Assistive Devices/Equipment: CBG Meter, Oxygen ADL Screening (condition at time of admission) Patient's cognitive ability adequate to safely complete daily activities?: Yes Is the patient deaf or have difficulty hearing?: No Does the patient have difficulty seeing, even when wearing glasses/contacts?: No Does the patient have difficulty concentrating, remembering, or making decisions?: No Patient able to express need for assistance with ADLs?: Yes Does the patient have difficulty dressing or bathing?: No Independently performs ADLs?: Yes (appropriate for developmental age) Does the patient have difficulty walking or climbing stairs?: No Weakness of Legs: None Weakness of Arms/Hands: None  Permission Sought/Granted                  Emotional Assessment     Affect (typically observed): Appropriate Orientation: : Oriented to Self, Oriented to Place, Oriented to  Time, Oriented to Situation Alcohol / Substance Use: Not Applicable Psych Involvement: No (comment)  Admission diagnosis:  Acute on chronic respiratory failure (HCC) [J96.20] Patient Active Problem List   Diagnosis Date Noted   Acute on chronic respiratory failure (HCC) 04/20/2023   Hyperlipidemia 04/20/2023   Hypokalemia 04/20/2023   Hyponatremia 04/20/2023   Atypical chest pain 04/20/2023   ERRONEOUS ENCOUNTER--DISREGARD 10/30/2022   Sepsis due to undetermined organism (HCC) 08/17/2021   Chronic pain syndrome 11/27/2019   Lobar pneumonia (HCC) 11/27/2019   Atypical pneumonia -  SEPSIS RULED OUT 09/28/2016   Chronic obstructive pulmonary disease (HCC)    Sepsis (HCC) 10/15/2015   Anxiety state 10/15/2015   Essential hypertension 10/15/2015   COPD with acute exacerbation (HCC) 08/14/2014   Community acquired pneumonia 03/11/2014   COPD  exacerbation (HCC) 04/22/2013   GERD (gastroesophageal reflux disease) 12/21/2012   Candida infection 11/26/2011   DM (diabetes mellitus) (HCC) 11/21/2011   Oral candidiasis 07/03/2011   Cigarette smoker 09/12/2010   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  02/14/2010   ADULT RESPIRATORY DISTRESS SYNDROME 02/13/2010   DELIRIUM 02/13/2010   PCP:   Pacer, NP Pharmacy:   [id number] [place], [place] - [address] [place] [address] Phone: [phone] Fax: [phone]  [facility] - [place], [place] - [address] [place] [address] Phone: [phone] Fax: [phone]  [id number] [place], [place] - [address] [place] [address] Phone: [phone] Fax: [phone]     Social Determinants of Health (SDOH) Social History: SDOH Screenings   Tobacco Use: High Risk (04/20/2023)   SDOH Interventions:     Readmission Risk Interventions    03/11/2022    9:30 AM  Readmission Risk Prevention Plan  Transportation Screening Complete  HRI or Home Care Consult Complete  Social Work Consult for Recovery Care Planning/Counseling Complete  Palliative Care Screening Not Applicable  Medication Review Oceanographer) Complete

## 2023-04-21 NOTE — Evaluation (Signed)
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  Felt warm had some chills.  EMS evaluation on the patient hypoxic satting 86% on 3 L of oxygen, she was placed on CPAP, 1 sublingual nitroglycerin was given. (per MD)   Clinical Impression   Pt agreeable to OT and PT co-evaluation. Pt is independent for ADL's at baseline with assist for IADL's from family. Pt is on 3 LPM O2 at home. Today pt noted to desaturate during transfer when on only 3 LPM. Pt was increased to 4 LPM for ambulation and noted to desaturate to ~82% SpO2. Pt was brought back to the room and her O2 saturation improved with rest in the chair. Pt used RW today during ambulation in the hall per her request, which she does not have at home. Pt was generally weak in B UE and demonstrates decreased endurance. Pt left in the chair with call bell within reach. Pt will benefit from continued OT in the hospital and recommended venue below to increase strength, balance, and endurance for safe ADL's.        Recommendations for follow up therapy are one component of a multi-disciplinary discharge planning process, led by the attending physician.  Recommendations may be updated based on patient status, additional functional criteria and insurance authorization.   Assistance Recommended at Discharge Set up Supervision/Assistance  Patient can return home with the following A little help with  walking and/or transfers;Assistance with cooking/housework;Assist for transportation;Help with stairs or ramp for entrance    Functional Status Assessment  Patient has had a recent decline in their functional status and demonstrates the ability to make significant improvements in function in a reasonable and predictable amount of time.  Equipment Recommendations  None recommended by OT    Recommendations for Other Services Other (comment) (See vision specialist if vision does not return to normal.)     Precautions / Restrictions Precautions Precautions: Fall Restrictions Weight Bearing Restrictions: No      Mobility Bed Mobility Overal bed mobility: Modified Independent                  Transfers Overall transfer level: Needs assistance Equipment used: Rolling walker (2 wheels) Transfers: Sit to/from Stand, Bed to chair/wheelchair/BSC Sit to Stand: Modified independent (Device/Increase time), Supervision     Step pivot transfers: Modified independent (Device/Increase time), Supervision     General transfer comment: No AD  for step pivot to chair. Noted to desaturate from simple transfer. Able to ambulate with RW outside the room.      Balance Overall balance assessment: Needs assistance Sitting-balance support: Feet supported, No upper extremity supported Sitting balance-Leahy Scale: Good Sitting balance - Comments: seated at EOB   Standing balance support: During functional activity, No upper extremity supported Standing balance-Leahy Scale: Fair Standing balance comment: fair to good without AD; good with RW  ADL either performed or assessed with clinical judgement   ADL Overall ADL's : Needs assistance/impaired     Grooming: Modified independent;Supervision/safety;Standing   Upper Body Bathing: Modified independent;Sitting   Lower Body Bathing: Modified independent;Sitting/lateral leans   Upper Body Dressing :  Modified independent;Sitting   Lower Body Dressing: Modified independent;Supervision/safety;Sitting/lateral leans   Toilet Transfer: Modified Independent;Supervision/safety;Ambulation;Rolling walker (2 wheels) Toilet Transfer Details (indicate cue type and reason): Simulated via transer to chair and ambulation in hall. Toileting- Clothing Manipulation and Hygiene: Modified independent;Sitting/lateral lean       Functional mobility during ADLs: Supervision/safety;Modified independent;Rolling walker (2 wheels) General ADL Comments: RW used per pt's request during ambulation out of the room. Able to ambulate over 50 feet.     Vision Baseline Vision/History: 1 Wears glasses Ability to See in Adequate Light: 1 Impaired Patient Visual Report: Blurring of vision;Other (comment) (wavy vision) Vision Assessment?:  (Pt reports continued blurry and wavy vision but she was able to read the board well and had no obvious deficits during ADL tasks.)                Pertinent Vitals/Pain Pain Assessment Pain Assessment: 0-10 Pain Score: 8  Pain Location: L lung Pain Descriptors / Indicators: Dull, Sharp Pain Intervention(s): Limited activity within patient's tolerance, Monitored during session, Repositioned     Hand Dominance Right   Extremity/Trunk Assessment Upper Extremity Assessment Upper Extremity Assessment: Generalized weakness   Lower Extremity Assessment Lower Extremity Assessment: Defer to PT evaluation   Cervical / Trunk Assessment Cervical / Trunk Assessment: Normal   Communication Communication Communication: No difficulties   Cognition Arousal/Alertness: Awake/alert Behavior During Therapy: WFL for tasks assessed/performed Overall Cognitive Status: Within Functional Limits for tasks assessed                                                        Home Living Family/patient expects to be discharged to:: Private residence Living Arrangements:  Children;Other relatives Available Help at Discharge: Family;Available 24 hours/day Type of Home: House Home Access: Stairs to enter [employer] of Steps: 4 Entrance Stairs-Rails: Can reach both Home Layout: One level     Bathroom Shower/Tub: Chief Strategy Officer: Standard Bathroom Accessibility: Yes How Accessible: Accessible via walker Home Equipment: Cane - quad;Wheelchair - manual          Prior Functioning/Environment Prior Level of Function : Needs assist       Physical Assist : ADLs (physical)   ADLs (physical): IADLs Mobility Comments: [place] ambulator without AD ADLs Comments: Independent ADL's ; assist for IADL's.        OT Problem List: Decreased strength;Decreased activity tolerance;Impaired balance (sitting and/or standing);Impaired vision/perception;Cardiopulmonary status limiting activity      OT Treatment/Interventions: Self-care/ADL training;Therapeutic exercise;Therapeutic activities;Patient/family education;Balance training;DME and/or AE instruction    OT Goals(Current goals can be found in the care plan section) Acute Rehab OT Goals Patient Stated Goal: return home OT Goal Formulation: With patient Time For Goal Achievement: 05/05/23 Potential to Achieve Goals: Good  OT Frequency: Min 1X/week    Co-evaluation PT/OT/SLP Co-Evaluation/Treatment: Yes Reason for Co-Treatment: To address functional/ADL transfers   OT goals addressed during session: ADL's and self-care                       End of  Session Equipment Utilized During Treatment: Rolling walker (2 wheels)  Activity Tolerance: Patient tolerated treatment well Patient left: in chair;with call bell/phone within reach  OT Visit Diagnosis: Unsteadiness on feet (R26.81);Other abnormalities of gait and mobility (R26.89);Muscle weakness (generalized) (M62.81)                Time: 1610-9604 OT Time Calculation (min): 17 min Charges:  OT General  Charges $OT Visit: 1 Visit OT Evaluation $OT Eval Low Complexity: 1 Low  Nikkita Adeyemi OT, MOT   Danie Chandler 04/21/2023, 9:50 AM

## 2023-04-21 NOTE — Inpatient Diabetes Management (Signed)
Inpatient Diabetes Program Recommendations  AACE/ADA: New Consensus Statement on Inpatient Glycemic Control (2015)  Target Ranges:  Prepandial:   less than 140 mg/dL      Peak postprandial:   less than 180 mg/dL (1-2 hours)      Critically ill patients:  140 - 180 mg/dL   Lab Results  Component Value Date   GLUCAP 382 (H) 04/20/2023   HGBA1C 12.0 (H) 04/20/2023    Latest Reference Range & Units 04/20/23 16:02 04/20/23 21:10  Glucose-Capillary 70 - 99 mg/dL 161 (H) 096 (H)  (H): Data is abnormally high  Diabetes history: DM2 Outpatient Diabetes medications: Lantus 18 units qs, Glucotrol 10 mg qd Current orders for Inpatient glycemic control: Semglee 25 units, Glucotrol 10 mg qd, Novolog 0-9 units tid, Hydrocortisone 100 mg q 12 hrs., Received Methylprednisone 125 mg x 1 on 04/20/23.  Inpatient Diabetes Program Recommendations:   Please consider while in the hospital on steroids: -Add Novolog correction 0-5 units hs -Add Novolog 5 units tid meal coverage if eats @ least 50% meals -D/C Glucotrol  Thank you, [name]. Joyce Woldt, RN, MSN, CDE  Diabetes Coordinator Inpatient Glycemic Control Team Team Pager [phone] (8am-5pm) 04/21/2023 7:49 AM

## 2023-04-22 DIAGNOSIS — J9621 Acute and chronic respiratory failure with hypoxia: Secondary | ICD-10-CM | POA: Diagnosis not present

## 2023-04-22 DIAGNOSIS — J441 Chronic obstructive pulmonary disease with (acute) exacerbation: Secondary | ICD-10-CM | POA: Diagnosis not present

## 2023-04-22 DIAGNOSIS — J181 Lobar pneumonia, unspecified organism: Secondary | ICD-10-CM | POA: Diagnosis not present

## 2023-04-22 LAB — BASIC METABOLIC PANEL
Anion gap: 5 (ref 5–15)
BUN: 16 mg/dL (ref 6–20)
CO2: 36 mmol/L — ABNORMAL HIGH (ref 22–32)
Calcium: 8.7 mg/dL — ABNORMAL LOW (ref 8.9–10.3)
Chloride: 93 mmol/L — ABNORMAL LOW (ref 98–111)
Creatinine, Ser: 0.49 mg/dL (ref 0.44–1.00)
GFR, Estimated: >60 mL/min (ref >60)
Glucose, Bld: 318 mg/dL — ABNORMAL HIGH (ref 70–99)
Potassium: 4.5 mmol/L (ref 3.5–5.1)
Sodium: 134 mmol/L — ABNORMAL LOW (ref 135–145)

## 2023-04-22 LAB — CBC
HCT: 31.4 % — ABNORMAL LOW (ref 36.0–46.0)
Hemoglobin: 9.7 g/dL — ABNORMAL LOW (ref 12.0–15.0)
MCH: 26.9 pg (ref 26.0–34.0)
MCHC: 30.9 g/dL (ref 30.0–36.0)
MCV: 87 fL (ref 80.0–100.0)
Platelets: 269 10*3/uL (ref 150–400)
RBC: 3.61 MIL/uL — ABNORMAL LOW (ref 3.87–5.11)
RDW: 13.5 % (ref 11.5–15.5)
WBC: 9.8 10*3/uL (ref 4.0–10.5)
nRBC: 0 % (ref 0.0–0.2)

## 2023-04-22 LAB — GLUCOSE, CAPILLARY
Glucose-Capillary: 307 mg/dL — ABNORMAL HIGH (ref 70–99)
Glucose-Capillary: 305 mg/dL — ABNORMAL HIGH (ref 70–99)
Glucose-Capillary: 232 mg/dL — ABNORMAL HIGH (ref 70–99)
Glucose-Capillary: 149 mg/dL — ABNORMAL HIGH (ref 70–99)

## 2023-04-22 LAB — MAGNESIUM: Magnesium: 1.8 mg/dL (ref 1.7–2.4)

## 2023-04-22 LAB — CULTURE, BLOOD (ROUTINE X 2): Culture: NO GROWTH

## 2023-04-22 LAB — LEGIONELLA PNEUMOPHILA SEROGP 1 UR AG: L. pneumophila Serogp 1 Ur Ag: NEGATIVE

## 2023-04-22 MED ORDER — SALINE SPRAY 0.65 % NA SOLN
1.0000 | NASAL | Status: DC | PRN
  Administered 2023-04-22: 1 via NASAL
  Filled 2023-04-22: qty 44

## 2023-04-22 MED ORDER — INSULIN GLARGINE-YFGN 100 UNIT/ML ~~LOC~~ SOLN
32.0000 [IU] | Freq: Every day | SUBCUTANEOUS | Status: DC
  Administered 2023-04-22: 32 [IU] via SUBCUTANEOUS
  Filled 2023-04-22 (×2): qty 0.32

## 2023-04-22 MED ORDER — MAGNESIUM SULFATE 2 GM/50ML IV SOLN
2.0000 g | Freq: Once | INTRAVENOUS | Status: AC
  Administered 2023-04-22: 2 g via INTRAVENOUS
  Filled 2023-04-22: qty 50

## 2023-04-22 MED ORDER — INSULIN ASPART 100 UNIT/ML IJ SOLN
6.0000 [IU] | Freq: Three times a day (TID) | INTRAMUSCULAR | Status: DC
  Administered 2023-04-22 – 2023-04-23 (×3): 6 [IU] via SUBCUTANEOUS

## 2023-04-22 NOTE — Progress Notes (Signed)
PROGRESS NOTE  [name] AVW:[id number] DOB: 04/21/1966 DOA: 04/20/2023 PCP: Kara Pacer, NP  Brief History:  57 year old female with a history of chronic respiratory failure on 3 L, COPD, hypertension, hyperlipidemia, diabetes mellitus type 2, tobacco abuse presenting with shortness of breath that began on 04/19/2023.  The patient also had pleuritic type left chest pain around her breast.  Is been complaining of a cough with yellow sputum.  She denies any hemoptysis.  She has had some subjective fevers and chills.  She states that she has not smoked about a week.  She has been smoking about 1/2 pack/day prior to this.  Because of worsening shortness of breath, EMS was activated.  The patient was noted to have oxygen saturation 85% on her home 3 L.  She was placed on CPAP initially.  In the ED, the patient was able to be weaned off of CPAP. She was afebrile and hemodynamically stable albeit with soft blood pressures.  WBC 32.2, hemoglobin 13.2, platelets 300.  VBG showed 7.5 1/43/133/34.  The patient was started on steroids and bronchodilators.  She was started on ceftriaxone and azithromycin. CTA chest was negative for PE.  It showed innumerable multifocal tree-in-bud nodularities.  There is unchanged mediastinal and infrahilar lymphadenopathy since May 2023.  There is bibasilar consolidations.   Assessment/Plan: Acute on chronic respiratory failure with hypoxia -Chronically on 3 L nasal cannula -Presented with hypoxia on 3 L, initially on CPAP -Secondary COPD exacerbation and pneumonia -Wean oxygen for saturation greater 92%   Sepsis -Present on admission -Presented with leukocytosis and tachycardia -Secondary to pneumonia -Lactic acid peaked 1.3 -Follow blood cultures--neg to date -Continue ceftriaxone and azithromycin   Lobar pneumonia -Continue ceftriaxone and azithromycin -Procalcitonin 11.77 -Urine Streptococcus pneumoniae antigen negative -sputum = S.  pneumoniae   COPD exacerbation -Started Pulmicort -Started Brovana -Continue Xopenex and Atrovent -Continue Solu-Medrol   Uncontrolled diabetes mellitus type 2 with hyperglycemia -04/20/2023 hemoglobin A1c 12.0 -Continue Semglee 25 units>>increase to 32 units -Add NovoLog 6 units with meals -Continue sliding scale   Pleuritic chest pain -Troponins 7>> 6 -04/20/2023 CTA chest negative PE   Mixed hyperlipidemia -Continue statin   Hypokalemia -Repleted -Check magnesium--1.8   Hyponatremia -Secondary to volume depletion -Continue IV fluids>> improving                   Family Communication:  no  Family at bedside   Consultants:  none   Code Status:  FULL   DVT Prophylaxis:  [place] Heparin   Procedures: As Listed in Progress Note Above   Antibiotics: Ceftriaxone 5/28>> Azithro 5/28>>        Subjective: Pt states her breathing is getting better with nebulizer changes.  Denies f/c, cp, n/v/d, abd pain  Objective: Vitals:   04/22/23 0936 04/22/23 0959 04/22/23 1000 04/22/23 1001  BP:   121/64   Pulse:   89   Resp: 15 19 (!) 22 20  Temp:      TempSrc:      SpO2:   97%   Weight:      Height:        Intake/Output Summary (Last 24 hours) at 04/22/2023 1032 Last data filed at 04/22/2023 0541 Gross per 24 hour  Intake 3275.8 ml  Output --  Net 3275.8 ml   Weight change: 6.2 kg Exam:  General:  Pt is alert, follows commands appropriately, not in acute distress HEENT: No icterus, No thrush, No  neck mass, /AT Cardiovascular: RRR, S1/S2, no rubs, no gallops Respiratory: diminished BS.  Bibasilar rales.  Minimal basilar wheeze Abdomen: Soft/+BS, non tender, non distended, no guarding Extremities: No edema, No lymphangitis, No petechiae, No rashes, no synovitis   Data Reviewed: I have personally reviewed following labs and imaging studies Basic Metabolic Panel: Recent Labs  Lab 04/20/23 0940 04/20/23 1446 04/21/23 0445 04/22/23 0512  NA 128*   --  130* 134*  K 3.1*  --  3.9 4.5  CL 85*  --  88* 93*  CO2 27  --  30 36*  GLUCOSE 247*  --  286* 318*  BUN 27*  --  20 16  CREATININE 0.56 0.49 0.48 0.49  CALCIUM 9.0  --  8.7* 8.7*  MG 1.7  --   --  1.8  PHOS  --  2.4*  --   --    Liver Function Tests: Recent Labs  Lab 04/20/23 0940  AST 14*  ALT 10  ALKPHOS 106  BILITOT 1.0  PROT 7.8  ALBUMIN 3.0*   No results for input(s): "LIPASE", "AMYLASE" in the last 168 hours. No results for input(s): "AMMONIA" in the last 168 hours. Coagulation Profile: Recent Labs  Lab 04/20/23 1509 04/21/23 0445  INR 1.2 1.2   CBC: Recent Labs  Lab 04/20/23 0940 04/20/23 1446 04/21/23 0445 04/22/23 0512  WBC 32.2* 26.3* 20.9* 9.8  NEUTROABS 28.3*  --   --   --   HGB 13.2 11.9* 11.0* 9.7*  HCT 40.6 37.0 35.3* 31.4*  MCV 82.7 84.5 86.1 87.0  PLT 300 274 287 269   Cardiac Enzymes: No results for input(s): "CKTOTAL", "CKMB", "CKMBINDEX", "TROPONINI" in the last 168 hours. BNP: Invalid input(s): "POCBNP" CBG: Recent Labs  Lab 04/21/23 0745 04/21/23 1043 04/21/23 1616 04/21/23 2119 04/22/23 0722  GLUCAP 270* 259* 326* 172* 307*   HbA1C: Recent Labs    04/20/23 1446  HGBA1C 12.0*   Urine analysis:    Component Value Date/Time   COLORURINE YELLOW 03/10/2022 0853   APPEARANCEUR CLEAR 03/10/2022 0853   LABSPEC 1.005 03/10/2022 0853   PHURINE 6.0 03/10/2022 0853   GLUCOSEU NEGATIVE 03/10/2022 0853   HGBUR SMALL (A) 03/10/2022 0853   BILIRUBINUR NEGATIVE 03/10/2022 0853   KETONESUR NEGATIVE 03/10/2022 0853   PROTEINUR NEGATIVE 03/10/2022 0853   UROBILINOGEN 1.0 07/24/2014 1605   NITRITE NEGATIVE 03/10/2022 0853   LEUKOCYTESUR NEGATIVE 03/10/2022 0853   Sepsis Labs: @LABRCNTIP (procalcitonin:4,lacticidven:4) ) Recent Results (from the past 240 hour(s))  Resp panel by RT-PCR (RSV, Flu A&B, Covid) Anterior Nasal Swab     Status: None   Collection Time: 04/20/23  9:34 AM   Specimen: Anterior Nasal Swab  Result Value  Ref Range Status   SARS Coronavirus 2 by RT PCR NEGATIVE NEGATIVE Final    Comment: (NOTE) SARS-CoV-2 target nucleic acids are NOT DETECTED.  The SARS-CoV-2 RNA is generally detectable in upper respiratory specimens during the acute phase of infection. The lowest concentration of SARS-CoV-2 viral copies this assay can detect is 138 copies/mL. A negative result does not preclude SARS-Cov-2 infection and should not be used as the sole basis for treatment or other patient management decisions. A negative result may occur with  improper specimen collection/handling, submission of specimen other than nasopharyngeal swab, presence of viral mutation(s) within the areas targeted by this assay, and inadequate number of viral copies(<138 copies/mL). A negative result must be combined with clinical observations, patient history, and epidemiological information. The expected result is Negative.  Fact  Sheet for Patients:  BloggerCourse.com  Fact Sheet for Healthcare Providers:  SeriousBroker.it  This test is no t yet approved or cleared by the [place] FDA and  has been authorized for detection and/or diagnosis of SARS-CoV-2 by FDA under an Emergency Use Authorization (EUA). This EUA will remain  in effect (meaning this test can be used) for the duration of the COVID-19 declaration under Section 564(b)(1) of the Act, 21 U.S.C.section 360bbb-3(b)(1), unless the authorization is terminated  or revoked sooner.       Influenza A by PCR NEGATIVE NEGATIVE Final   Influenza B by PCR NEGATIVE NEGATIVE Final    Comment: (NOTE) The Xpert Xpress SARS-CoV-2/FLU/RSV plus assay is intended as an aid in the diagnosis of influenza from Nasopharyngeal swab specimens and should not be used as a sole basis for treatment. Nasal washings and aspirates are unacceptable for Xpert Xpress SARS-CoV-2/FLU/RSV testing.  Fact Sheet for  Patients: BloggerCourse.com  Fact Sheet for Healthcare Providers: SeriousBroker.it  This test is not yet approved or cleared by the [place] FDA and has been authorized for detection and/or diagnosis of SARS-CoV-2 by FDA under an Emergency Use Authorization (EUA). This EUA will remain in effect (meaning this test can be used) for the duration of the COVID-19 declaration under Section 564(b)(1) of the Act, 21 U.S.C. section 360bbb-3(b)(1), unless the authorization is terminated or revoked.     Resp Syncytial Virus by PCR NEGATIVE NEGATIVE Final    Comment: (NOTE) Fact Sheet for Patients: BloggerCourse.com  Fact Sheet for Healthcare Providers: SeriousBroker.it  This test is not yet approved or cleared by the [place] FDA and has been authorized for detection and/or diagnosis of SARS-CoV-2 by FDA under an Emergency Use Authorization (EUA). This EUA will remain in effect (meaning this test can be used) for the duration of the COVID-19 declaration under Section 564(b)(1) of the Act, 21 U.S.C. section 360bbb-3(b)(1), unless the authorization is terminated or revoked.  Performed at [facility], [address] [place], [place] [address]   Blood culture (routine x 2)     Status: None (Preliminary result)   Collection Time: 04/20/23 10:24 AM   Specimen: BLOOD RIGHT HAND  Result Value Ref Range Status   Specimen Description   Final    BLOOD RIGHT HAND BOTTLES DRAWN AEROBIC AND ANAEROBIC   Special Requests   Final    Blood Culture results may not be optimal due to an excessive volume of blood received in culture bottles   Culture   Final    NO GROWTH 2 DAYS Performed at [facility], [address], [place], [place] [address]    Report Status PENDING  Incomplete  Blood culture (routine x 2)     Status: None (Preliminary result)   Collection Time: 04/20/23 10:24 AM    Specimen: BLOOD LEFT HAND  Result Value Ref Range Status   Specimen Description   Final    BLOOD LEFT HAND BOTTLES DRAWN AEROBIC AND ANAEROBIC   Special Requests   Final    Blood Culture results may not be optimal due to an excessive volume of blood received in culture bottles   Culture   Final    NO GROWTH 2 DAYS Performed at [facility], [address] [place], [place] [address]    Report Status PENDING  Incomplete  MRSA Next Gen by PCR, Nasal     Status: Abnormal   Collection Time: 04/20/23  3:52 PM   Specimen: Nasal Mucosa; Nasal Swab  Result Value  Ref Range Status   MRSA by PCR Next Gen DETECTED (A) NOT DETECTED Final    Comment: RESULT CALLED TO, READ BACK BY AND VERIFIED WITH: BRITTANY FOLEY @ 1846 ON 04/20/2023 C VARNER (NOTE) The GeneXpert MRSA Assay (FDA approved for NASAL specimens only), is one component of a comprehensive MRSA colonization surveillance program. It is not intended to diagnose MRSA infection nor to guide or monitor treatment for MRSA infections. Test performance is not FDA approved in patients less than 29 years old. Performed at [facility], [address], [place], [place] [address]   Expectorated Sputum Assessment w Gram Stain, Rflx to Resp Cult     Status: None   Collection Time: 04/20/23  4:25 PM   Specimen: Expectorated Sputum  Result Value Ref Range Status   Specimen Description EXPECTORATED SPUTUM  Final   Special Requests NONE  Final   Sputum evaluation   Final    THIS SPECIMEN IS ACCEPTABLE FOR SPUTUM CULTURE Performed at [facility], [address], [place], [place] [address]    Report Status 04/20/2023 FINAL  Final  Culture, Respiratory w Gram Stain     Status: None (Preliminary result)   Collection Time: 04/20/23  4:25 PM  Result Value Ref Range Status   Specimen Description   Final    EXPECTORATED SPUTUM Performed at [facility], [address] [place], [place] [address]    Special Requests   Final    NONE Reflexed from  [phone] Performed at [facility], [address], [place], [place] [address]    Gram Stain   Final    ABUNDANT WBC PRESENT, PREDOMINANTLY PMN FEW GRAM POSITIVE COCCI IN PAIRS IN CHAINS FEW GRAM NEGATIVE COCCOBACILLI INTRACELLULAR    Culture   Final    FEW STREPTOCOCCUS PNEUMONIAE SUSCEPTIBILITIES TO FOLLOW Performed at [facility], [address] [place], [place] [address]    Report Status PENDING  Incomplete     Scheduled Meds:  arformoterol  15 mcg Nebulization BID   budesonide (PULMICORT) nebulizer solution  0.5 mg Nebulization BID   Chlorhexidine Gluconate Cloth  6 each Topical Daily   Chlorhexidine Gluconate Cloth  6 each Topical Q0600   heparin  5,000 Units Subcutaneous Q8H   insulin aspart  0-5 Units Subcutaneous QHS   insulin aspart  0-9 Units Subcutaneous TID WC   insulin aspart  6 Units Subcutaneous TID WC   insulin glargine-yfgn  32 Units Subcutaneous QHS   ipratropium  0.5 mg Nebulization Q6H WA   levalbuterol  0.63 mg Nebulization Q6H   loratadine  10 mg Oral Daily   methylPREDNISolone (SOLU-MEDROL) injection  60 mg Intravenous Q12H   mupirocin ointment  1 Application Nasal BID   pantoprazole  40 mg Oral Daily   rOPINIRole  1 mg Oral QHS   rosuvastatin  20 mg Oral Daily   Continuous Infusions:  sodium chloride 75 mL/hr at 04/22/23 0541   azithromycin 500 mg (04/22/23 1015)   cefTRIAXone (ROCEPHIN)  IV 2 g (04/22/23 0924)   magnesium sulfate bolus IVPB      Procedures/Studies: CT Angio Chest PE W and/or Wo Contrast  Result Date: 04/20/2023 CLINICAL DATA:  Pulmonary embolism (PE) suspected, high prob EXAM: CT ANGIOGRAPHY CHEST WITH CONTRAST TECHNIQUE: Multidetector CT imaging of the chest was performed using the standard protocol during bolus administration of intravenous contrast. Multiplanar CT image reconstructions and MIPs were obtained to evaluate the vascular anatomy. RADIATION DOSE REDUCTION: This exam was performed according to the departmental  dose-optimization program which includes automated exposure control, adjustment of the mA and/or kV according to patient size and/or use of iterative reconstruction technique. CONTRAST:  75mL OMNIPAQUE IOHEXOL 350 MG/ML SOLN COMPARISON:  CTA CAP, 04/19/2022.  Chest XR, earlier same day. FINDINGS: Cardiovascular: Satisfactory opacification of the pulmonary arteries to the segmental level. No segmental or larger pulmonary embolus. Normal heart size. No pericardial effusion. Mediastinum/Nodes: Similar appearance of mediastinal and BILATERAL hilar adenopathy since 03/2022. No enlarged axillary lymph nodes. Thyroid gland, trachea, and esophagus demonstrate no significant findings. Lungs/Pleura: Multifocal/innumerable, basilar-predominant tree-in-bud nodularities. Peribronchial wall thickening. Findings improved in appearance since 04/19/2022 comparison. Bibasilar dependent consolidations. No pleural effusion or pneumothorax. Upper Abdomen: No acute abnormality. Musculoskeletal: Mild, chronic-appearing T7 superior endplate deformity. No acute chest wall abnormality. No acute or significant osseous findings. Review of the MIP images confirms the above findings. IMPRESSION: 1. No segmental or larger pulmonary embolus. 2. Innumerable multifocal basilar-predominant tree-in-bud nodularities, most likely consistent with chronic inflammatory change. 3. Mediastinal and hilar adenopathy, unchanged since 03/2022. 4. Chronic T7 mild superior endplate compression deformity. Additional incidental, chronic and senescent findings as above. Roanna Banning, MD [facility] Radiology Electronically Signed   By: Roanna Banning M.D.   On: 04/20/2023 13:05   DG Chest Port 1 View  Result Date: 04/20/2023 CLINICAL DATA:  Shortness of breath over the last few days. Asthma and COPD. EXAM: PORTABLE CHEST 1 VIEW COMPARISON:  04/19/2022 FINDINGS: Heart size remains normal. Mediastinal shadows are  normal. Chronic interstitial lung markings. Worsened patchy density in both lower lobes consistent with bibasilar bronchopneumonia. No lobar consolidation or collapse. No measurable effusion. IMPRESSION: Worsened patchy density in both lower lobes consistent with bibasilar bronchopneumonia. Electronically Signed   By: Paulina Fusi M.D.   On: 04/20/2023 09:55    Catarina Hartshorn, DO  [facility]  If 7PM-7AM, please contact night-coverage [email or web address] Password [email or web address] 04/22/2023, 10:32 AM   LOS: 2 days

## 2023-04-22 NOTE — Progress Notes (Signed)
Physical Therapy Treatment Patient Details Name: [name] MRN: [id number] DOB: 11-28-1965 Today's Date: 04/22/2023   History of Present Illness [name] is a 57 year old female with a chronic respiratory failure, COPD dependent on 3 L, chronic tobacco abuse, DM2,  HTN, HLD,     Presenting today with acute on chronic respiratory failure, initially patient was on CPAP then BiPAP, now in ED has been weaned down to 4 L of oxygen     Patient reports worsening shortness of breath since yesterday, last night with left-sided chest pain, congestion, cough productive of yellowish sputum production.  Felt warm had some chills.  EMS evaluation on the patient hypoxic satting 86% on 3 L of oxygen, she was placed on CPAP, 1 sublingual nitroglycerin was given.    PT Comments    Patient demonstrates increased endurance/distance for gait training without requiring use of an AD, slightly labored cadence without use of an AD, on 3 LPM O2 with SpO2 dropping from 96% to 88% and limited mostly due to fatigue and mild SOB.  Patient tolerated sitting up at bedside with SpO2 increasing to 96% while on 3 LPM.  Patient will benefit from continued skilled physical therapy in hospital and recommended venue below to increase strength, balance, endurance for safe ADLs and gait.     Recommendations for follow up therapy are one component of a multi-disciplinary discharge planning process, led by the attending physician.  Recommendations may be updated based on patient status, additional functional criteria and insurance authorization.  Follow Up Recommendations       Assistance Recommended at Discharge Set up Supervision/Assistance  Patient can return home with the following A little help with walking and/or transfers;A little help with bathing/dressing/bathroom;Help with stairs or ramp for entrance;Assistance with cooking/housework   Equipment Recommendations  Rolling walker (2 wheels)    Recommendations for Other  Services       Precautions / Restrictions Precautions Precautions: Fall Restrictions Weight Bearing Restrictions: No     Mobility  Bed Mobility Overal bed mobility: Modified Independent, Independent                  Transfers Overall transfer level: Modified independent                 General transfer comment: good retunr for completing sit to stands and transfers without loss of balance    Ambulation/Gait Ambulation/Gait assistance: Supervision Gait Distance (Feet): 100 Feet Assistive device: None Gait Pattern/deviations: Decreased step length - right, Decreased step length - left, Decreased stride length Gait velocity: decreased     General Gait Details: slightly labored cadence without requiring use of an AD, no loss of balance and limited mostly due to fatigue and mild SOB with SpO2 dropping from 96% to 88% while on 3 LPM O2   Stairs             Wheelchair Mobility    Modified Rankin (Stroke Patients Only)       Balance Overall balance assessment: Needs assistance Sitting-balance support: Feet supported, No upper extremity supported Sitting balance-Leahy Scale: Good Sitting balance - Comments: seated at EOB   Standing balance support: During functional activity, No upper extremity supported Standing balance-Leahy Scale: Fair Standing balance comment: fair/good without AD                            Cognition Arousal/Alertness: Awake/alert Behavior During Therapy: WFL for tasks assessed/performed Overall Cognitive Status: Within  Functional Limits for tasks assessed                                          Exercises General Exercises - Lower Extremity Ankle Circles/Pumps: Seated, AROM, Strengthening, Both, 10 reps Long Arc Quad: Seated, AROM, Strengthening, Both, 10 reps Hip Flexion/Marching: Seated, AROM, Strengthening, Both, 10 reps    General Comments        Pertinent Vitals/Pain Pain  Assessment Pain Assessment: Faces Faces Pain Scale: Hurts little more Pain Location: low back after walking Pain Descriptors / Indicators: Sore, Dull Pain Intervention(s): Limited activity within patient's tolerance, Monitored during session, Repositioned    Home Living                          Prior Function            PT Goals (current goals can now be found in the care plan section) Acute Rehab PT Goals Patient Stated Goal: return home with family to assist PT Goal Formulation: With patient Time For Goal Achievement: 04/28/23 Potential to Achieve Goals: Good Progress towards PT goals: Progressing toward goals    Frequency    Min 3X/week      PT Plan Current plan remains appropriate    Co-evaluation              AM-PAC PT "6 Clicks" Mobility   Outcome Measure  Help needed turning from your back to your side while in a flat bed without using bedrails?: None Help needed moving from lying on your back to sitting on the side of a flat bed without using bedrails?: None Help needed moving to and from a bed to a chair (including a wheelchair)?: None   Help needed to walk in hospital room?: A Little Help needed climbing 3-5 steps with a railing? : A Little 6 Click Score: 18    End of Session Equipment Utilized During Treatment: Oxygen Activity Tolerance: Patient tolerated treatment well;Patient limited by fatigue Patient left: in bed;with call bell/phone within reach Nurse Communication: Mobility status PT Visit Diagnosis: Unsteadiness on feet (R26.81);Other abnormalities of gait and mobility (R26.89);Muscle weakness (generalized) (M62.81)     Time: 4098-1191 PT Time Calculation (min) (ACUTE ONLY): 20 min  Charges:  $Gait Training: 8-22 mins $Therapeutic Exercise: 8-22 mins                     3:50 PM, 04/22/23 Ocie Bob, MPT Physical Therapist with [facility] [phone] office [phone] mobile phone

## 2023-04-23 DIAGNOSIS — J441 Chronic obstructive pulmonary disease with (acute) exacerbation: Secondary | ICD-10-CM | POA: Diagnosis not present

## 2023-04-23 DIAGNOSIS — J9621 Acute and chronic respiratory failure with hypoxia: Secondary | ICD-10-CM | POA: Diagnosis not present

## 2023-04-23 DIAGNOSIS — J181 Lobar pneumonia, unspecified organism: Secondary | ICD-10-CM | POA: Diagnosis not present

## 2023-04-23 DIAGNOSIS — G894 Chronic pain syndrome: Secondary | ICD-10-CM | POA: Diagnosis not present

## 2023-04-23 LAB — CBC
HCT: 35.9 % — ABNORMAL LOW (ref 36.0–46.0)
Hemoglobin: 11 g/dL — ABNORMAL LOW (ref 12.0–15.0)
MCH: 26.6 pg (ref 26.0–34.0)
MCHC: 30.6 g/dL (ref 30.0–36.0)
MCV: 86.9 fL (ref 80.0–100.0)
Platelets: 344 10*3/uL (ref 150–400)
RBC: 4.13 MIL/uL (ref 3.87–5.11)
RDW: 13.4 % (ref 11.5–15.5)
WBC: 9.7 10*3/uL (ref 4.0–10.5)
nRBC: 0 % (ref 0.0–0.2)

## 2023-04-23 LAB — BASIC METABOLIC PANEL
Anion gap: 9 (ref 5–15)
BUN: 16 mg/dL (ref 6–20)
CO2: 32 mmol/L (ref 22–32)
Calcium: 8.6 mg/dL — ABNORMAL LOW (ref 8.9–10.3)
Chloride: 93 mmol/L — ABNORMAL LOW (ref 98–111)
Creatinine, Ser: 0.57 mg/dL (ref 0.44–1.00)
GFR, Estimated: >60 mL/min (ref >60)
Glucose, Bld: 268 mg/dL — ABNORMAL HIGH (ref 70–99)
Potassium: 4.1 mmol/L (ref 3.5–5.1)
Sodium: 134 mmol/L — ABNORMAL LOW (ref 135–145)

## 2023-04-23 LAB — CULTURE, RESPIRATORY W GRAM STAIN

## 2023-04-23 LAB — CULTURE, BLOOD (ROUTINE X 2)

## 2023-04-23 LAB — GLUCOSE, CAPILLARY: Glucose-Capillary: 265 mg/dL — ABNORMAL HIGH (ref 70–99)

## 2023-04-23 MED ORDER — METFORMIN HCL 500 MG PO TABS: 500.0000 mg | ORAL_TABLET | Freq: Two times a day (BID) | ORAL | 1 refills | Status: DC

## 2023-04-23 MED ORDER — GLIPIZIDE 5 MG PO TABS: 10.0000 mg | ORAL_TABLET | Freq: Every day | ORAL | 1 refills | Status: DC

## 2023-04-23 MED ORDER — PREDNISONE 50 MG PO TABS: 50.0000 mg | ORAL_TABLET | Freq: Every day | ORAL | 0 refills | Status: DC

## 2023-04-23 MED ORDER — AZITHROMYCIN 250 MG PO TABS
500.0000 mg | ORAL_TABLET | Freq: Every day | ORAL | Status: DC
  Administered 2023-04-23: 500 mg via ORAL
  Filled 2023-04-23: qty 2

## 2023-04-23 MED ORDER — PREDNISONE 20 MG PO TABS
60.0000 mg | ORAL_TABLET | Freq: Every day | ORAL | Status: DC
  Administered 2023-04-23: 60 mg via ORAL
  Filled 2023-04-23: qty 3

## 2023-04-23 MED ORDER — CEFDINIR 300 MG PO CAPS: 300.0000 mg | ORAL_CAPSULE | Freq: Two times a day (BID) | ORAL | 0 refills | Status: DC

## 2023-04-23 MED ORDER — AZITHROMYCIN 500 MG PO TABS: 500.0000 mg | ORAL_TABLET | Freq: Every day | ORAL | 0 refills | Status: DC

## 2023-04-23 MED ORDER — INSULIN GLARGINE 100 UNIT/ML SOLOSTAR PEN: 25.0000 [IU] | PEN_INJECTOR | Freq: Every day | SUBCUTANEOUS | 2 refills | Status: DC

## 2023-04-23 MED ORDER — OXYCODONE HCL 5 MG PO TABS: 5.0000 mg | ORAL_TABLET | ORAL | 0 refills | Status: DC | PRN

## 2023-04-23 MED ORDER — CEFDINIR 300 MG PO CAPS
300.0000 mg | ORAL_CAPSULE | Freq: Two times a day (BID) | ORAL | Status: DC
  Administered 2023-04-23: 300 mg via ORAL
  Filled 2023-04-23: qty 1

## 2023-04-23 NOTE — Progress Notes (Signed)
Patient discharged home today, transported by family home. Discharge summary went over with patient by Lawanna Kobus RN, patient verbalized understanding. Belongings sent home with patient. Patients family brought patients portable oxygen tank for patient to go home.

## 2023-04-23 NOTE — Discharge Summary (Signed)
Physician Discharge Summary   Patient: [name] MRN: [id number] DOB: 04/02/1966  Admit date:     04/20/2023  Discharge date: 04/23/23  Discharge Physician: Onalee Hua Desiree Daise   PCP: Kara Pacer, NP   Recommendations at discharge:   Please follow up with primary care provider within 1-2 weeks  Please repeat BMP and CBC in one week     Hospital Course: 57 year old female with a history of chronic respiratory failure on 3 L, COPD, hypertension, hyperlipidemia, diabetes mellitus type 2, tobacco abuse presenting with shortness of breath that began on 04/19/2023.  The patient also had pleuritic type left chest pain around her breast.  Is been complaining of a cough with yellow sputum.  She denies any hemoptysis.  She has had some subjective fevers and chills.  She states that she has not smoked about a week.  She has been smoking about 1/2 pack/day prior to this.  Because of worsening shortness of breath, EMS was activated.  The patient was noted to have oxygen saturation 85% on her home 3 L.  She was placed on CPAP initially.  In the ED, the patient was able to be weaned off of CPAP. She was afebrile and hemodynamically stable albeit with soft blood pressures.  WBC 32.2, hemoglobin 13.2, platelets 300.  VBG showed 7.5 1/43/133/34.  The patient was started on steroids and bronchodilators.  She was started on ceftriaxone and azithromycin. CTA chest was negative for PE.  It showed innumerable multifocal tree-in-bud nodularities.  There is unchanged mediastinal and infrahilar lymphadenopathy since May 2023.  There is bibasilar consolidations.  Assessment and Plan:   Acute on chronic respiratory failure with hypoxia -Chronically on 3 L nasal cannula -Presented with hypoxia on 3 L, initially on CPAP -Secondary COPD exacerbation and pneumonia -Wean oxygen for saturation greater 92% -now back on 3L   Sepsis -Present on admission -Presented with leukocytosis and tachycardia -Secondary to  pneumonia -Lactic acid peaked 1.3 -Follow blood cultures--neg to date -Continue ceftriaxone and azithromycin -sepsis physiology resolved -d/c home with cefdinir and azithro x 3 more days   Lobar pneumonia -Continue ceftriaxone and azithromycin -Procalcitonin 11.77 -Urine Streptococcus pneumoniae antigen negative -sputum = S. pneumoniae   COPD exacerbation -Started Pulmicort -Started Brovana -Continue Xopenex and Atrovent -Continue Solu-Medrol>>d/c home with prednisone x 3 more days   Uncontrolled diabetes mellitus type 2 with hyperglycemia -04/20/2023 hemoglobin A1c 12.0 -Continue Semglee 25 units>>increase to 32 units -Add NovoLog 6 units with meals -Continue sliding scale -d/c home with lantus 25 units daily -restart glipizide and added metformin   Pleuritic chest pain -Troponins 7>> 6 -04/20/2023 CTA chest negative PE   Mixed hyperlipidemia -Continue statin   Hypokalemia -Repleted -Check magnesium--1.8   Hyponatremia -Secondary to volume depletion -Continue IV fluids>> improving    Consultants: none Procedures performed: none  Disposition: Home Diet recommendation:  Carb modified diet DISCHARGE MEDICATION: Allergies as of 04/23/2023       Reactions   Levofloxacin Anaphylaxis   Levofloxacin In D5w Itching, Swelling, Rash   IV only IV only   Penicillins Other (See Comments)   convulsions   Doxycycline Nausea And Vomiting   Morphine Nausea Only        Medication List     STOP taking these medications    irbesartan 150 MG tablet Commonly known as: AVAPRO       TAKE these medications    acetaminophen 500 MG tablet Commonly known as: TYLENOL Take 500 mg by mouth every 6 (six) hours as  needed for fever or mild pain.   albuterol (2.5 MG/3ML) 0.083% nebulizer solution Commonly known as: PROVENTIL Take 3 mLs (2.5 mg total) by nebulization every 6 (six) hours as needed for wheezing or shortness of breath.   albuterol 108 (90 Base) MCG/ACT  inhaler Commonly known as: VENTOLIN HFA Inhale 2 puffs into the lungs every 4 (four) hours as needed for wheezing or shortness of breath.   azithromycin 500 MG tablet Commonly known as: ZITHROMAX Take 1 tablet (500 mg total) by mouth daily. Start taking on: April 24, 2023   cefdinir 300 MG capsule Commonly known as: OMNICEF Take 1 capsule (300 mg total) by mouth every 12 (twelve) hours. Start taking on: April 24, 2023   fluticasone 50 MCG/ACT nasal spray Commonly known as: FLONASE Place 2 sprays into both nostrils daily.   glipiZIDE 5 MG tablet Commonly known as: GLUCOTROL Take 2 tablets (10 mg total) by mouth daily before breakfast. What changed:  medication strength when to take this   insulin glargine 100 UNIT/ML Solostar Pen Commonly known as: LANTUS Inject 25 Units into the skin daily. What changed: how much to take   loratadine 10 MG tablet Commonly known as: CLARITIN Take 10 mg by mouth daily.   metFORMIN 500 MG tablet Commonly known as: GLUCOPHAGE Take 1 tablet (500 mg total) by mouth 2 (two) times daily with a meal.   methocarbamol 500 MG tablet Commonly known as: ROBAXIN Take 500 mg by mouth every 8 (eight) hours as needed for muscle spasms.   ondansetron 4 MG disintegrating tablet Commonly known as: ZOFRAN-ODT 4mg  ODT q4 hours prn nausea/vomit   oxyCODONE 5 MG immediate release tablet Commonly known as: Oxy IR/ROXICODONE Take 1 tablet (5 mg total) by mouth every 4 (four) hours as needed for moderate pain.   pantoprazole 40 MG tablet Commonly known as: PROTONIX Take 1 tablet (40 mg total) by mouth daily.   polyethylene glycol 17 g packet Commonly known as: MIRALAX / GLYCOLAX Take 17 g by mouth daily.   predniSONE 50 MG tablet Commonly known as: DELTASONE Take 1 tablet (50 mg total) by mouth daily with breakfast. Start taking on: April 24, 2023   rOPINIRole 1 MG tablet Commonly known as: REQUIP Take 1 mg by mouth at bedtime.   rosuvastatin 20 MG  tablet Commonly known as: CRESTOR Take 20 mg by mouth daily.   SUMAtriptan 50 MG tablet Commonly known as: IMITREX Take 50 mg by mouth as needed for migraine or headache.               Durable Medical Equipment  (From admission, onward)           Start     Ordered   04/21/23 1421  For home use only DME Walker rolling  Once       Question Answer Comment  Walker: With 5 Inch Wheels   Patient needs a walker to treat with the following condition Gait instability      04/21/23 1420   04/21/23 1420  For home use only DME Shower stool  Once        04/21/23 1419            Discharge Exam: Filed Weights   04/22/23 0500 04/22/23 1321 04/23/23 0500  Weight: 69.9 kg 71.6 kg 71.1 kg   HEENT:  [place]/AT, No thrush, no icterus CV:  RRR, no rub, no S3, no S4 Lung:  bibasilar rales. No wheeze Abd:  soft/+BS, NT Ext:  No  edema, no lymphangitis, no synovitis, no rash   Condition at discharge: stable  The results of significant diagnostics from this hospitalization (including imaging, microbiology, ancillary and laboratory) are listed below for reference.   Imaging Studies: CT Angio Chest PE W and/or Wo Contrast  Result Date: 04/20/2023 CLINICAL DATA:  Pulmonary embolism (PE) suspected, high prob EXAM: CT ANGIOGRAPHY CHEST WITH CONTRAST TECHNIQUE: Multidetector CT imaging of the chest was performed using the standard protocol during bolus administration of intravenous contrast. Multiplanar CT image reconstructions and MIPs were obtained to evaluate the vascular anatomy. RADIATION DOSE REDUCTION: This exam was performed according to the departmental dose-optimization program which includes automated exposure control, adjustment of the mA and/or kV according to patient size and/or use of iterative reconstruction technique. CONTRAST:  75mL OMNIPAQUE IOHEXOL 350 MG/ML SOLN COMPARISON:  CTA CAP, 04/19/2022.  Chest XR, earlier same day. FINDINGS: Cardiovascular: Satisfactory opacification  of the pulmonary arteries to the segmental level. No segmental or larger pulmonary embolus. Normal heart size. No pericardial effusion. Mediastinum/Nodes: Similar appearance of mediastinal and BILATERAL hilar adenopathy since 03/2022. No enlarged axillary lymph nodes. Thyroid gland, trachea, and esophagus demonstrate no significant findings. Lungs/Pleura: Multifocal/innumerable, basilar-predominant tree-in-bud nodularities. Peribronchial wall thickening. Findings improved in appearance since 04/19/2022 comparison. Bibasilar dependent consolidations. No pleural effusion or pneumothorax. Upper Abdomen: No acute abnormality. Musculoskeletal: Mild, chronic-appearing T7 superior endplate deformity. No acute chest wall abnormality. No acute or significant osseous findings. Review of the MIP images confirms the above findings. IMPRESSION: 1. No segmental or larger pulmonary embolus. 2. Innumerable multifocal basilar-predominant tree-in-bud nodularities, most likely consistent with chronic inflammatory change. 3. Mediastinal and hilar adenopathy, unchanged since 03/2022. 4. Chronic T7 mild superior endplate compression deformity. Additional incidental, chronic and senescent findings as above. Roanna Banning, MD [facility] Radiology Electronically Signed   By: Roanna Banning M.D.   On: 04/20/2023 13:05   DG Chest Port 1 View  Result Date: 04/20/2023 CLINICAL DATA:  Shortness of breath over the last few days. Asthma and COPD. EXAM: PORTABLE CHEST 1 VIEW COMPARISON:  04/19/2022 FINDINGS: Heart size remains normal. Mediastinal shadows are normal. Chronic interstitial lung markings. Worsened patchy density in both lower lobes consistent with bibasilar bronchopneumonia. No lobar consolidation or collapse. No measurable effusion. IMPRESSION: Worsened patchy density in both lower lobes consistent with bibasilar bronchopneumonia. Electronically Signed   By: Paulina Fusi M.D.   On:  04/20/2023 09:55    Microbiology: Results for orders placed or performed during the hospital encounter of 04/20/23  Resp panel by RT-PCR (RSV, Flu A&B, Covid) Anterior Nasal Swab     Status: None   Collection Time: 04/20/23  9:34 AM   Specimen: Anterior Nasal Swab  Result Value Ref Range Status   SARS Coronavirus 2 by RT PCR NEGATIVE NEGATIVE Final    Comment: (NOTE) SARS-CoV-2 target nucleic acids are NOT DETECTED.  The SARS-CoV-2 RNA is generally detectable in upper respiratory specimens during the acute phase of infection. The lowest concentration of SARS-CoV-2 viral copies this assay can detect is 138 copies/mL. A negative result does not preclude SARS-Cov-2 infection and should not be used as the sole basis for treatment or other patient management decisions. A negative result may occur with  improper specimen collection/handling, submission of specimen other than nasopharyngeal swab, presence of viral mutation(s) within the areas targeted by this assay, and inadequate number of viral copies(<138 copies/mL). A negative result must be combined with clinical observations, patient history, and epidemiological information. The expected result is  Negative.  Fact Sheet for Patients:  BloggerCourse.com  Fact Sheet for Healthcare Providers:  SeriousBroker.it  This test is no t yet approved or cleared by the [place] FDA and  has been authorized for detection and/or diagnosis of SARS-CoV-2 by FDA under an Emergency Use Authorization (EUA). This EUA will remain  in effect (meaning this test can be used) for the duration of the COVID-19 declaration under Section 564(b)(1) of the Act, 21 U.S.C.section 360bbb-3(b)(1), unless the authorization is terminated  or revoked sooner.       Influenza A by PCR NEGATIVE NEGATIVE Final   Influenza B by PCR NEGATIVE NEGATIVE Final    Comment: (NOTE) The Xpert Xpress SARS-CoV-2/FLU/RSV plus  assay is intended as an aid in the diagnosis of influenza from Nasopharyngeal swab specimens and should not be used as a sole basis for treatment. Nasal washings and aspirates are unacceptable for Xpert Xpress SARS-CoV-2/FLU/RSV testing.  Fact Sheet for Patients: BloggerCourse.com  Fact Sheet for Healthcare Providers: SeriousBroker.it  This test is not yet approved or cleared by the [place] FDA and has been authorized for detection and/or diagnosis of SARS-CoV-2 by FDA under an Emergency Use Authorization (EUA). This EUA will remain in effect (meaning this test can be used) for the duration of the COVID-19 declaration under Section 564(b)(1) of the Act, 21 U.S.C. section 360bbb-3(b)(1), unless the authorization is terminated or revoked.     Resp Syncytial Virus by PCR NEGATIVE NEGATIVE Final    Comment: (NOTE) Fact Sheet for Patients: BloggerCourse.com  Fact Sheet for Healthcare Providers: SeriousBroker.it  This test is not yet approved or cleared by the [place] FDA and has been authorized for detection and/or diagnosis of SARS-CoV-2 by FDA under an Emergency Use Authorization (EUA). This EUA will remain in effect (meaning this test can be used) for the duration of the COVID-19 declaration under Section 564(b)(1) of the Act, 21 U.S.C. section 360bbb-3(b)(1), unless the authorization is terminated or revoked.  Performed at [facility], [address], [place], [place] [address]   Blood culture (routine x 2)     Status: None (Preliminary result)   Collection Time: 04/20/23 10:24 AM   Specimen: BLOOD RIGHT HAND  Result Value Ref Range Status   Specimen Description   Final    BLOOD RIGHT HAND BOTTLES DRAWN AEROBIC AND ANAEROBIC   Special Requests   Final    Blood Culture results may not be optimal due to an excessive volume of blood received in culture bottles    Culture   Final    NO GROWTH 3 DAYS Performed at [facility], [address] [place], [place] [address]    Report Status PENDING  Incomplete  Blood culture (routine x 2)     Status: None (Preliminary result)   Collection Time: 04/20/23 10:24 AM   Specimen: BLOOD LEFT HAND  Result Value Ref Range Status   Specimen Description   Final    BLOOD LEFT HAND BOTTLES DRAWN AEROBIC AND ANAEROBIC   Special Requests   Final    Blood Culture results may not be optimal due to an excessive volume of blood received in culture bottles   Culture   Final    NO GROWTH 3 DAYS Performed at [facility], [address] [place], [place] [address]    Report Status PENDING  Incomplete  MRSA Next Gen by PCR, Nasal     Status: Abnormal   Collection Time: 04/20/23  3:52 PM   Specimen: Nasal Mucosa; Nasal Swab  Result Value Ref Range Status   MRSA by PCR Next Gen DETECTED (A) NOT DETECTED Final    Comment: RESULT CALLED TO, READ BACK BY AND VERIFIED WITH: BRITTANY FOLEY @ 1846 ON 04/20/2023 C VARNER (NOTE) The GeneXpert MRSA Assay (FDA approved for NASAL specimens only), is one component of a comprehensive MRSA colonization surveillance program. It is not intended to diagnose MRSA infection nor to guide or monitor treatment for MRSA infections. Test performance is not FDA approved in patients less than 7 years old. Performed at [facility], [address] [place], [place] [address]   Expectorated Sputum Assessment w Gram Stain, Rflx to Resp Cult     Status: None   Collection Time: 04/20/23  4:25 PM   Specimen: Expectorated Sputum  Result Value Ref Range Status   Specimen Description EXPECTORATED SPUTUM  Final   Special Requests NONE  Final   Sputum evaluation   Final    THIS SPECIMEN IS ACCEPTABLE FOR SPUTUM CULTURE Performed at [facility], [address] [place], [place] [address]    Report Status 04/20/2023 FINAL  Final  Culture, Respiratory w Gram Stain     Status: None (Preliminary result)    Collection Time: 04/20/23  4:25 PM  Result Value Ref Range Status   Specimen Description   Final    EXPECTORATED SPUTUM Performed at [facility], [address], [place], [place] [address]    Special Requests   Final    NONE Reflexed from [id number] Performed at [facility], [address] [place], [place] [address]    Gram Stain   Final    ABUNDANT WBC PRESENT, PREDOMINANTLY PMN FEW GRAM POSITIVE COCCI IN PAIRS IN CHAINS FEW GRAM NEGATIVE COCCOBACILLI INTRACELLULAR    Culture   Final    FEW STREPTOCOCCUS PNEUMONIAE SUSCEPTIBILITIES TO FOLLOW Performed at [facility], [address], [place], [place] [address]    Report Status PENDING  Incomplete    Labs: CBC: Recent Labs  Lab 04/20/23 0940 04/20/23 1446 04/21/23 0445 04/22/23 0512 04/23/23 0428  WBC 32.2* 26.3* 20.9* 9.8 9.7  NEUTROABS 28.3*  --   --   --   --   HGB 13.2 11.9* 11.0* 9.7* 11.0*  HCT 40.6 37.0 35.3* 31.4* 35.9*  MCV 82.7 84.5 86.1 87.0 86.9  PLT 300 274 287 269 344   Basic Metabolic Panel: Recent Labs  Lab 04/20/23 0940 04/20/23 1446 04/21/23 0445 04/22/23 0512 04/23/23 0428  NA 128*  --  130* 134* 134*  K 3.1*  --  3.9 4.5 4.1  CL 85*  --  88* 93* 93*  CO2 27  --  30 36* 32  GLUCOSE 247*  --  286* 318* 268*  BUN 27*  --  20 16 16   CREATININE 0.56 0.49 0.48 0.49 0.57  CALCIUM 9.0  --  8.7* 8.7* 8.6*  MG 1.7  --   --  1.8  --   PHOS  --  2.4*  --   --   --    Liver Function Tests: Recent Labs  Lab 04/20/23 0940  AST 14*  ALT 10  ALKPHOS 106  BILITOT 1.0  PROT 7.8  ALBUMIN 3.0*   CBG: Recent Labs  Lab 04/22/23 0722 04/22/23 1116 04/22/23 1730 04/22/23 2148 04/23/23 0731  GLUCAP 307* 305* 232* 149* 265*    Discharge time spent: greater than 30 minutes.  Signed: Catarina Hartshorn, MD [facility] 04/23/2023

## 2023-04-23 NOTE — Progress Notes (Signed)
Vitals stable. Pt slept through the night. PRN Oxycodone given for 10/10 pain. Pt's abd distended and PRN Senokot given for reported constipation.

## 2023-04-24 LAB — CULTURE, BLOOD (ROUTINE X 2)

## 2023-04-25 LAB — CULTURE, BLOOD (ROUTINE X 2)

## 2023-05-05 IMAGING — CT CT HEAD W/O CM
4 series · 15 of 47 positions shown, 17 images · non-contrast
Comparison: 04/21/2020

CLINICAL DATA: Fall with head injury.



[Series 2: head w o · axial · 0.42mm/px · z∈[+93,+213]mm · 7 of 32 slices shown, 9 images]
[im 4/32  brain]
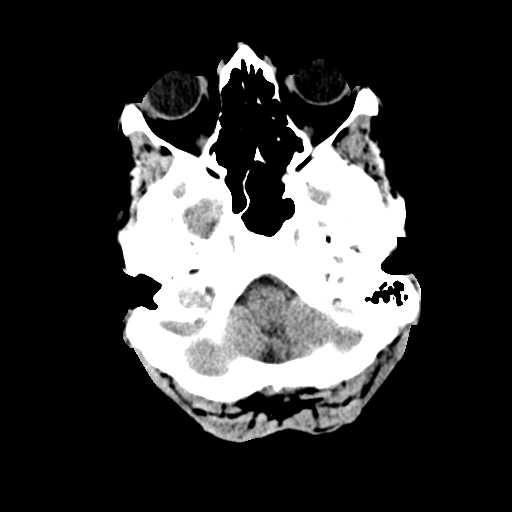
[im 4/32  bone]
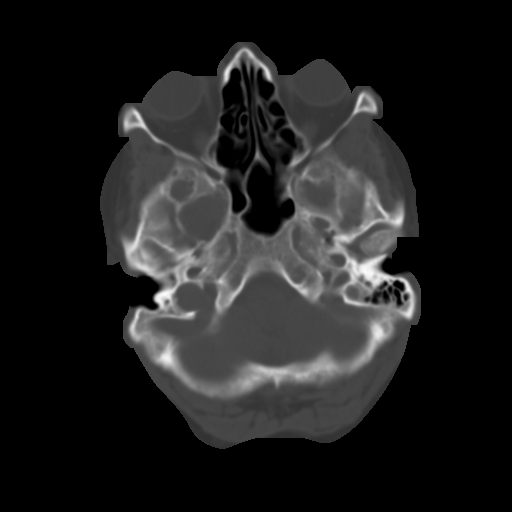
[im 8/32  brain]
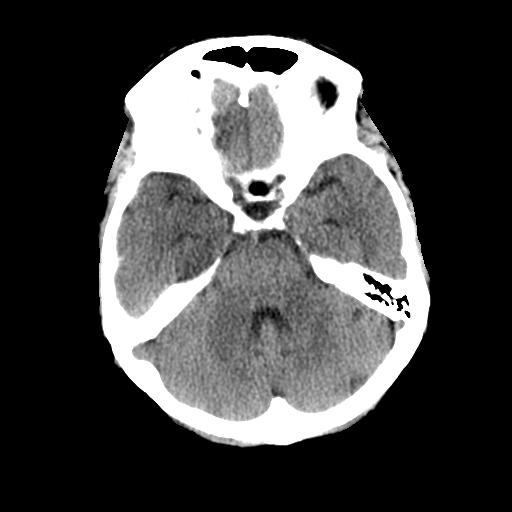
[im 12/32  brain]
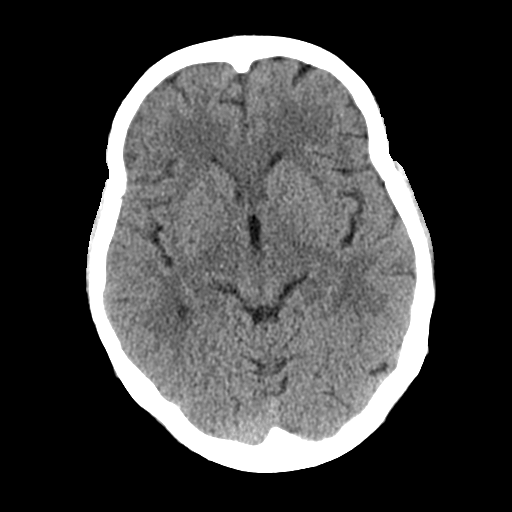
[im 16/32  brain]
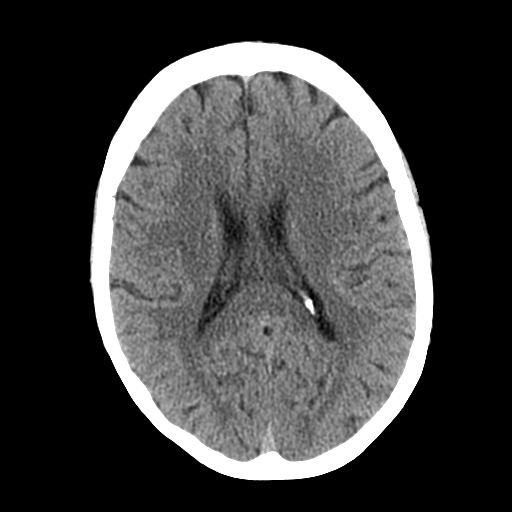
[im 20/32  brain]
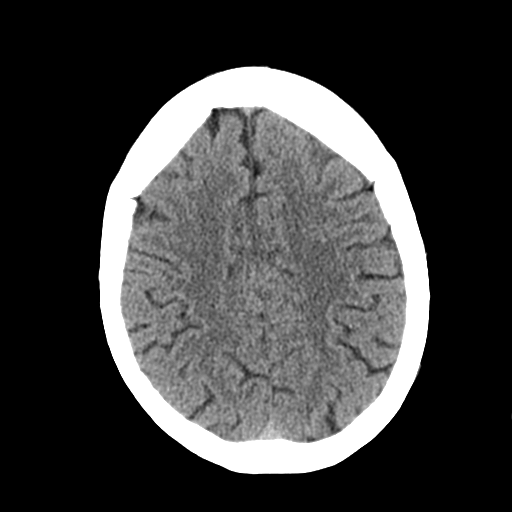
[im 20/32  bone]
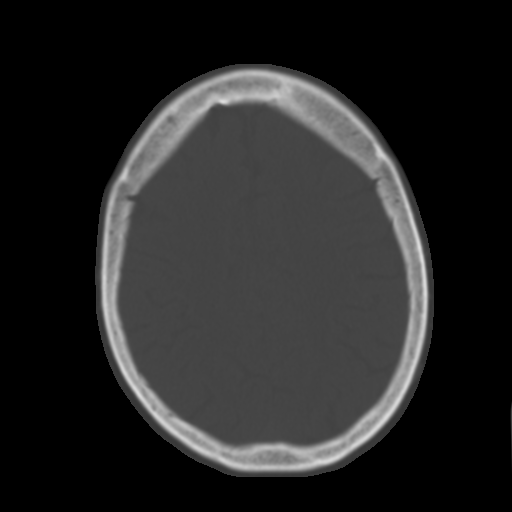
[im 24/32  brain]
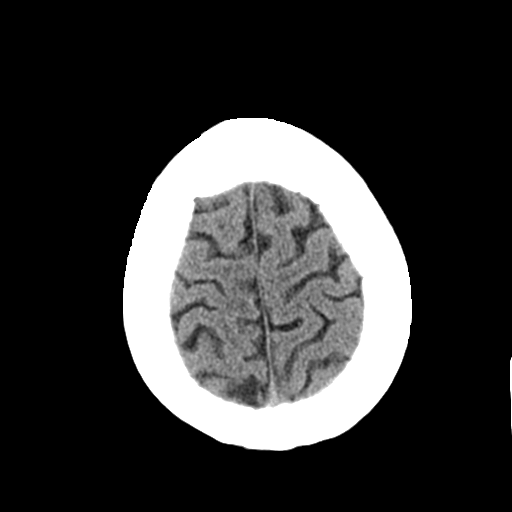
[im 28/32  brain]
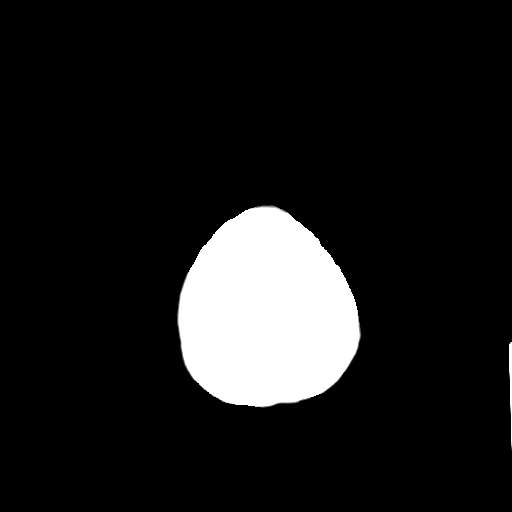

[Series 4: head bone · axial · 0.42mm/px · z∈[+92,+108]mm · 2 of 80 slices shown]
[im 8/80  bone]
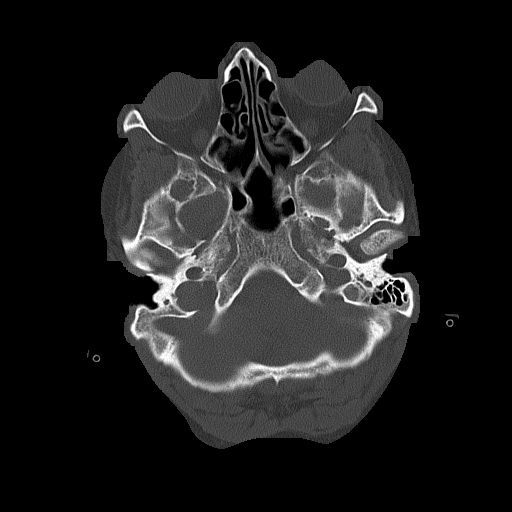
[im 16/80  bone]
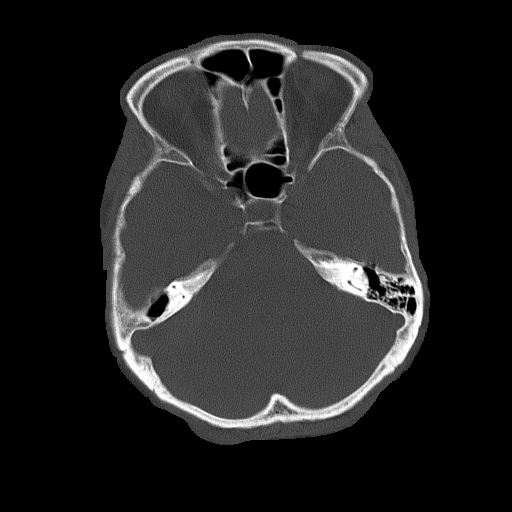

[Series 5: coronal soft · coronal · 0.31mm/px · 3 of 71 slices shown]
[im 24/71  brain]
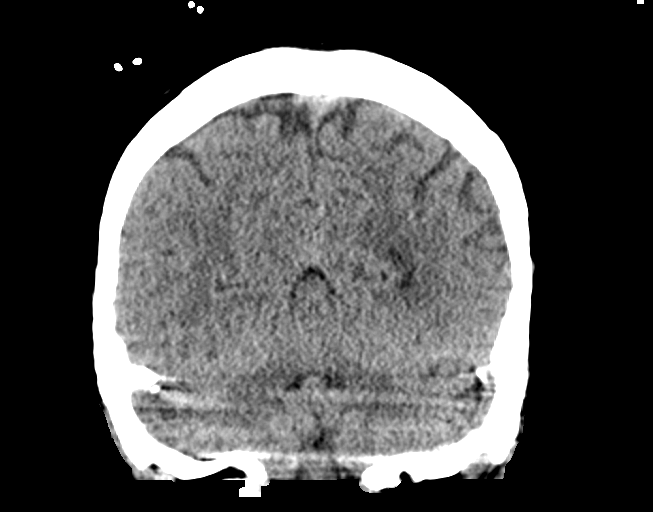
[im 32/71  brain]
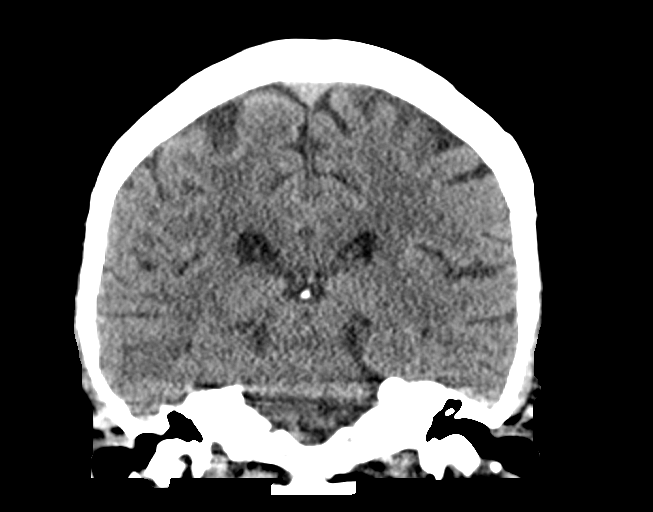
[im 39/71  brain]
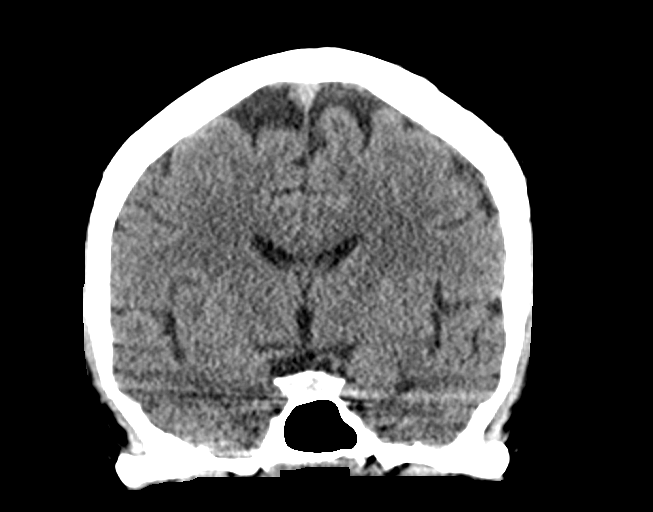

[Series 6: sagittal soft · sagittal · 0.31mm/px · 3 of 60 slices shown]
[im 20/60  brain]
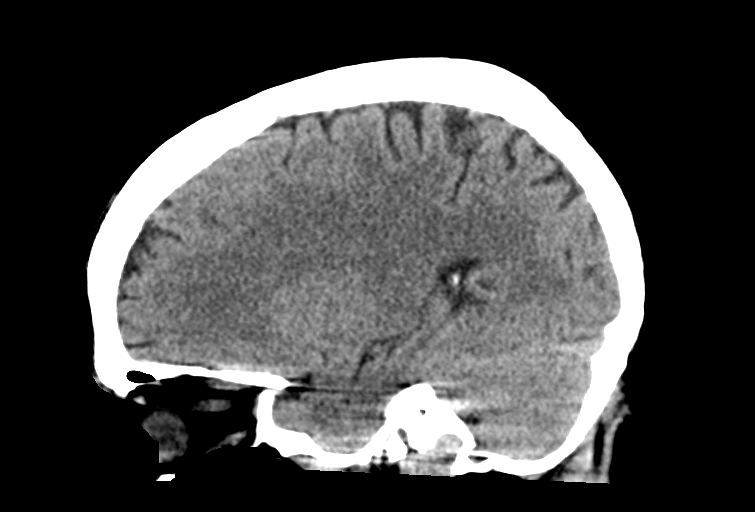
[im 30/60  brain]
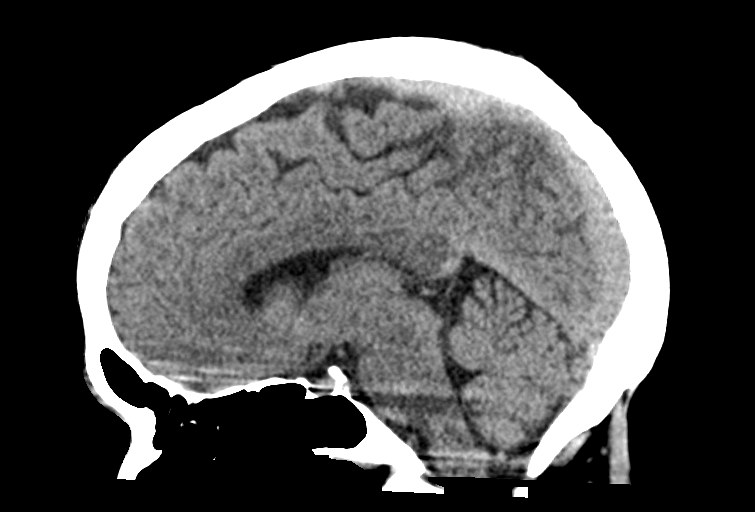
[im 40/60  brain]
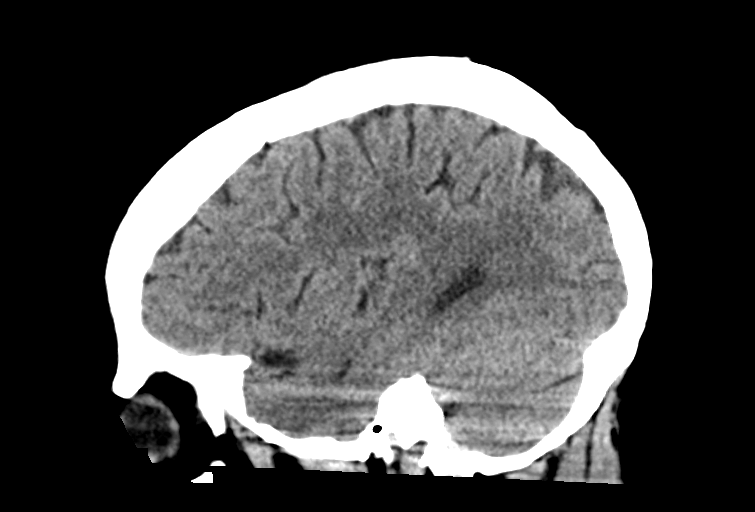

[15 of 47 positions shown; findings below may reference images not displayed]

FINDINGS: Brain: No evidence of acute infarction, hemorrhage, hydrocephalus,
extra-axial collection or mass lesion/mass effect.

Vascular: No hyperdense vessel or unexpected calcification.

Skull: Normal. Negative for fracture or focal lesion.

Sinuses/Orbits: Chronic mucosal thickening in the left maxillary
antrum.

Other: None.
IMPRESSION: No acute findings.  Chronic left maxillary sinusitis.

## 2023-05-05 IMAGING — CT CT L SPINE W/O CM
3 of 4 series · 10 of 33 positions shown, 11 images · non-contrast
Comparison: 03/02/2021

CLINICAL DATA: Back trauma, no prior imaging (Age >= 16y)



[Series 1: l spine soft sagittal · sagittal · 0.27mm/px · 5 of 67 slices shown]
[im 23/67  bone]
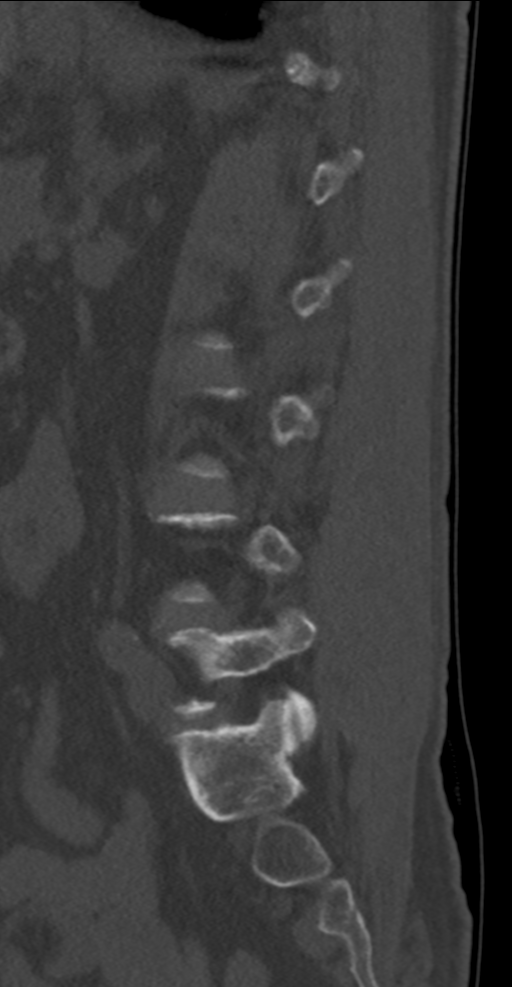
[im 28/67  bone]
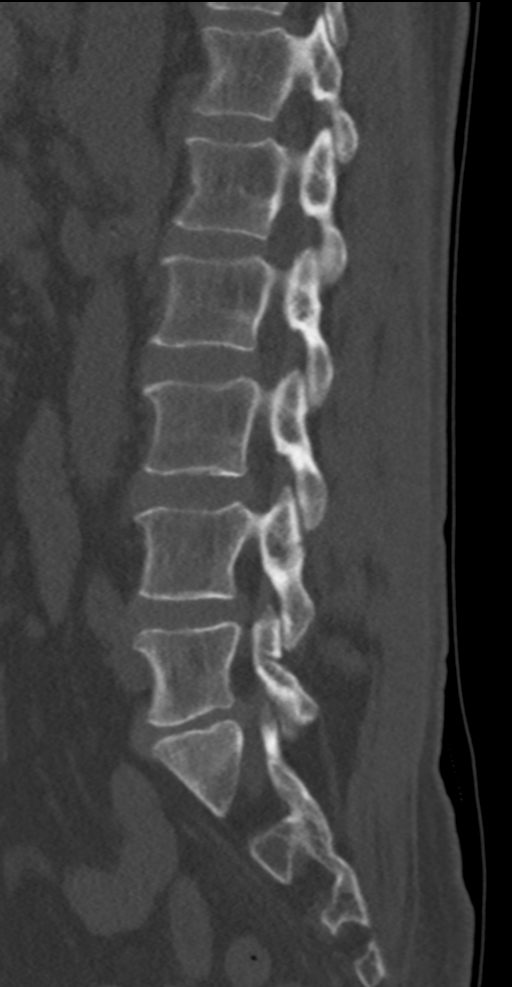
[im 34/67  bone]
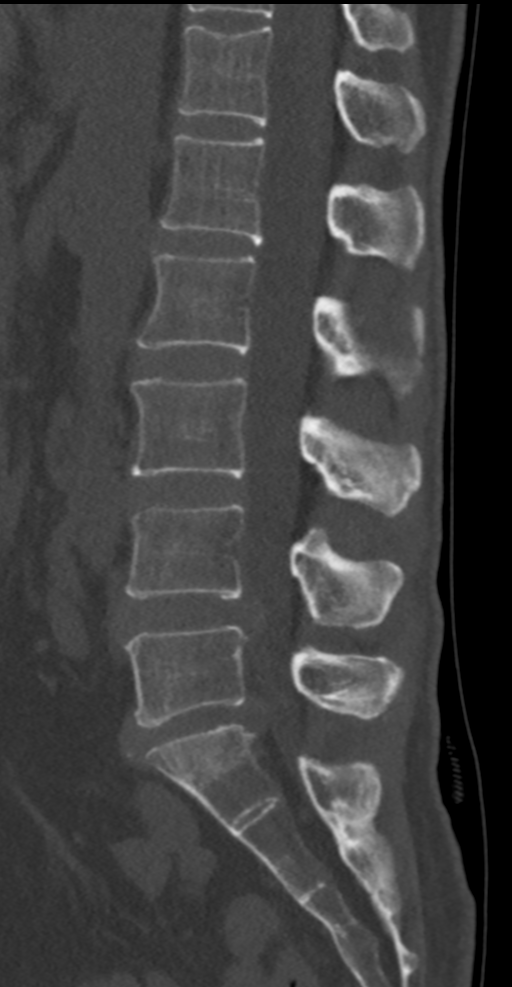
[im 39/67  bone]
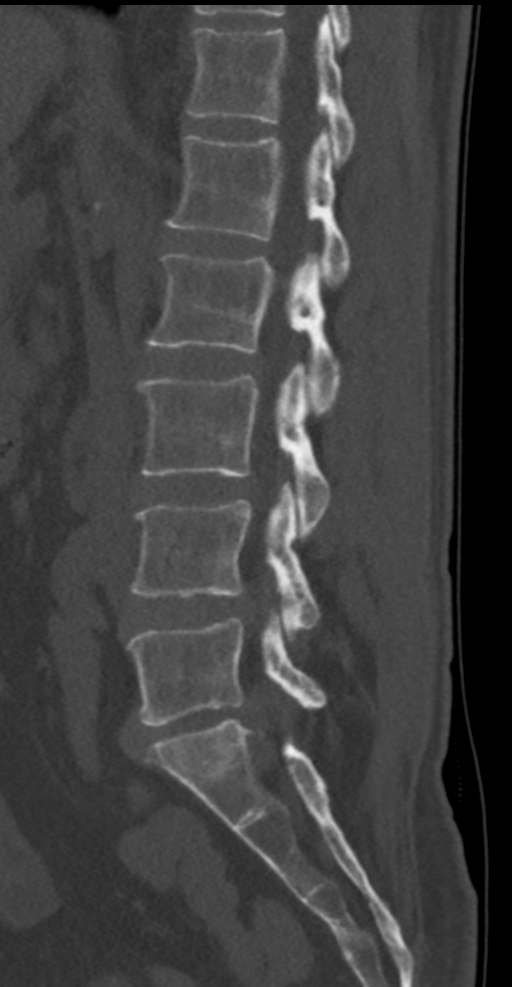
[im 45/67  bone]
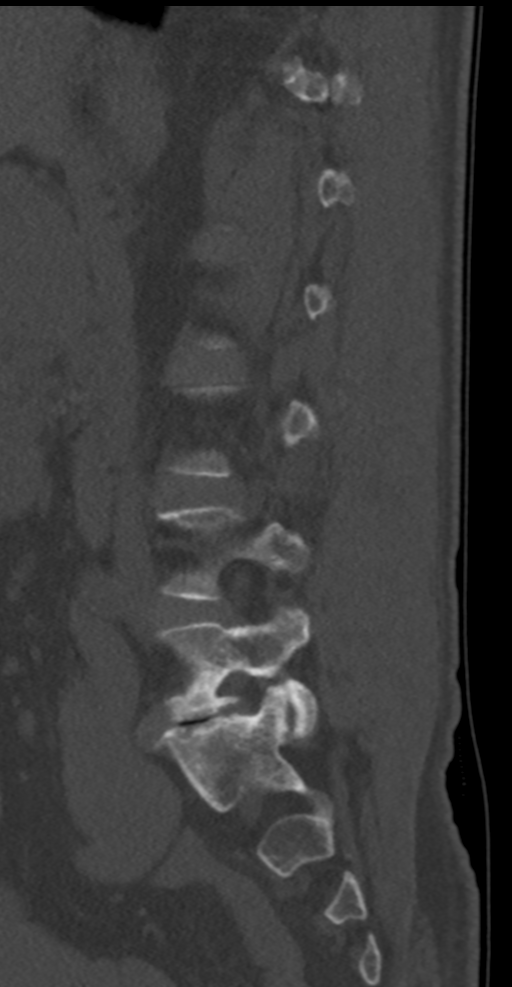

[Series 2: ax disc · axial · 0.21mm/px · z∈[-384,-293]mm · 2 of 114 slices shown, 3 images]
[im 46/114  soft-tissue]
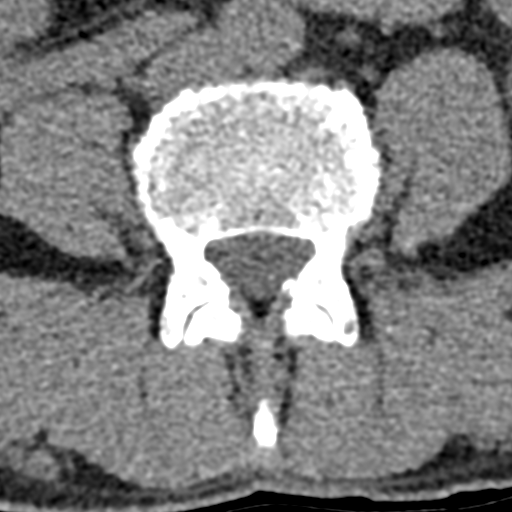
[im 46/114  bone]
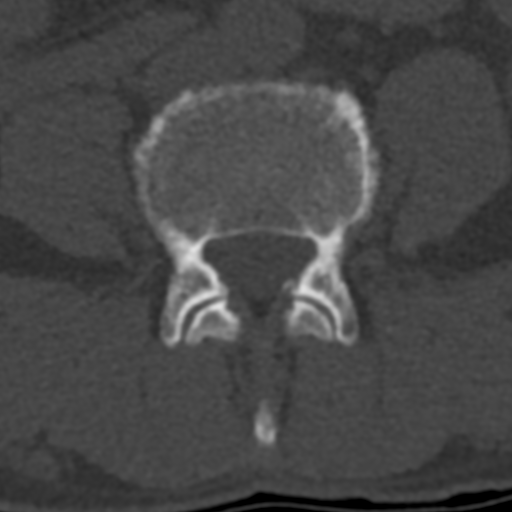
[im 91/114  bone]
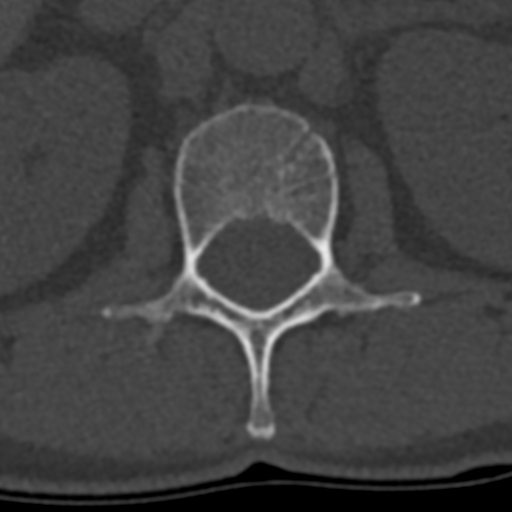

[Series 3: coronal bone · coronal · 0.24mm/px · 3 of 63 slices shown]
[im 13/63  bone]
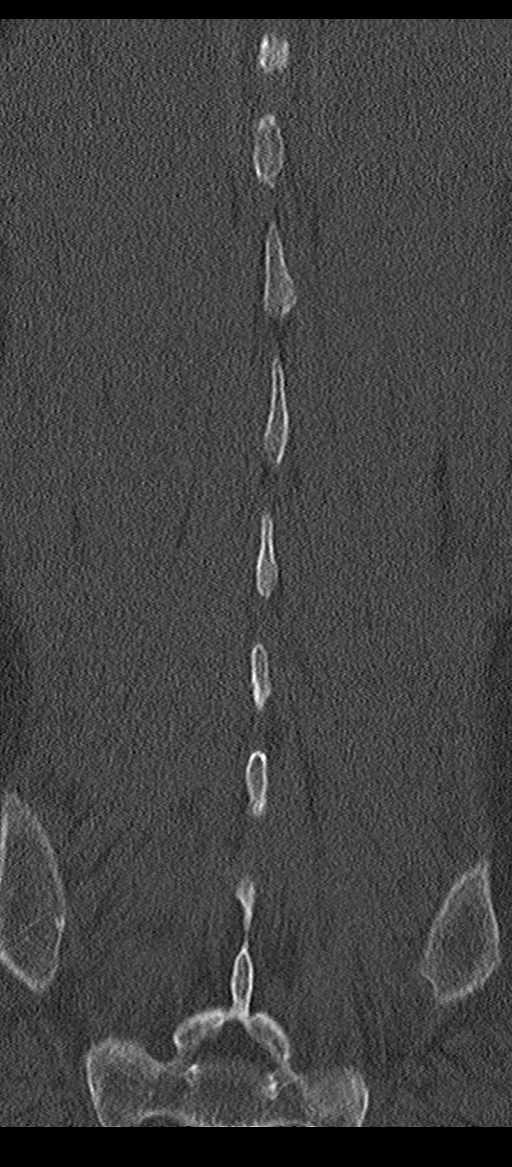
[im 25/63  bone]
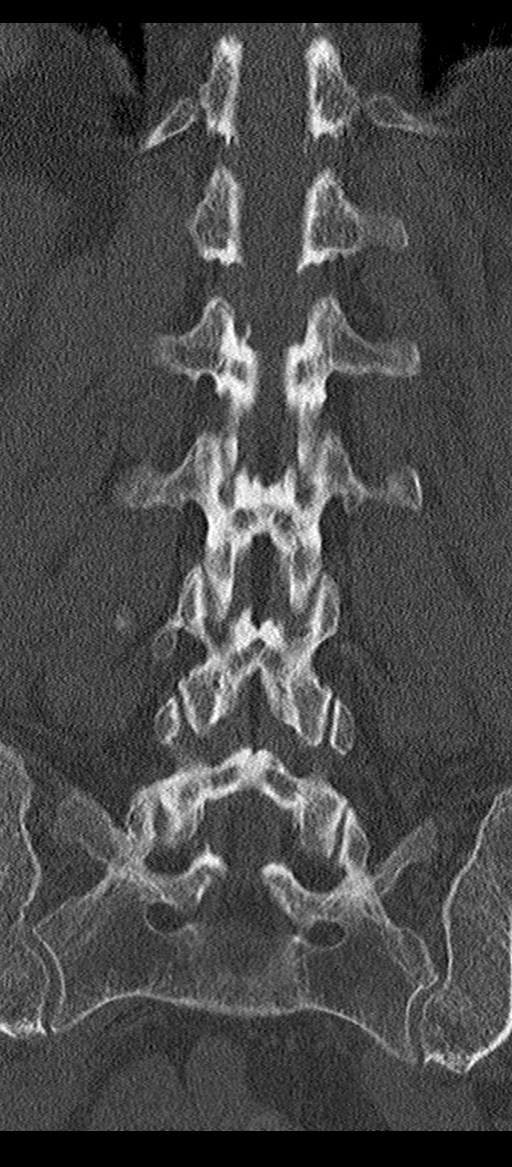
[im 38/63  bone]
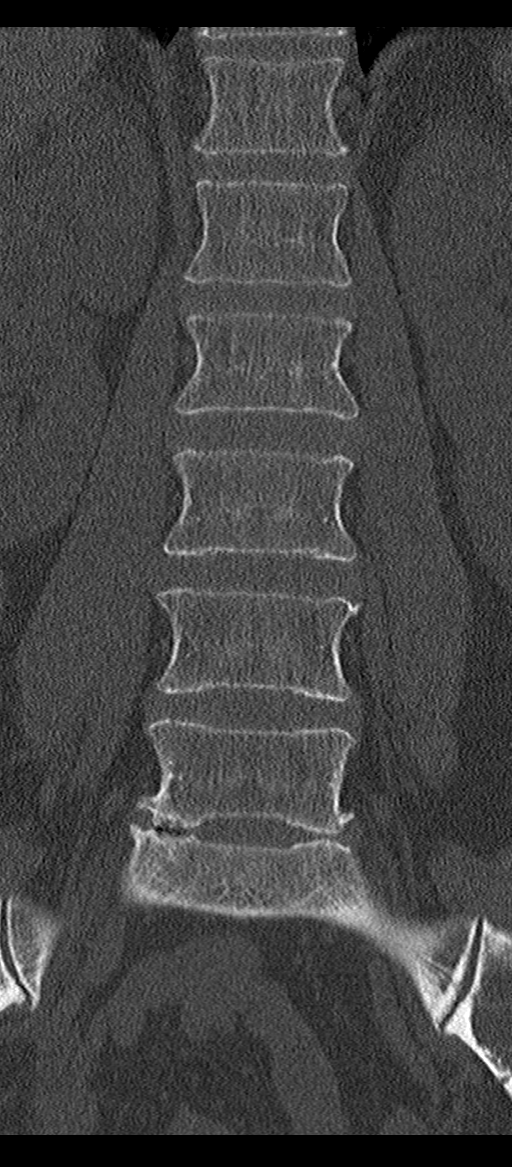

[10 of 33 positions shown; findings below may reference images not displayed]

FINDINGS: Segmentation: 5 lumbar type vertebrae.

Alignment: Normal.

Vertebrae: No acute fracture or focal pathologic process.

Paraspinal and other soft tissues: Negative.

Disc levels: The T12-L1 through L3-L4 levels appear unremarkable by
CT without evidence to suggest foraminal or canal stenosis.

Diffuse disc bulge with central protrusion at L4-5 with mild
ligamentum flavum thickening results in bilateral subarticular
recess narrowing and possibly mild canal stenosis. Bilateral
foramina appear patent.

Prominent right paracentral disc protrusion at L5-S1 with bilateral
subarticular recess narrowing. No evidence of canal stenosis.
Suspect mild right foraminal stenosis.
IMPRESSION: 1. No acute fracture or traumatic listhesis of the lumbar spine.
2. Degenerative disc disease of the L4-5 and L5-S1 levels, as above.
Appearance is similar to the previous MRI, when accounting for
differences in technique.

## 2023-05-05 IMAGING — CT CT CERVICAL SPINE W/O CM
4 of 5 series · 15 of 33 positions shown, 17 images · non-contrast
Comparison: None Available.

CLINICAL DATA: Neck trauma, dangerous injury mechanism (Age 16-64y)



[Series 7: sagittal bone · sagittal · 0.31mm/px · 5 of 61 slices shown, 6 images]
[im 21/61  bone]
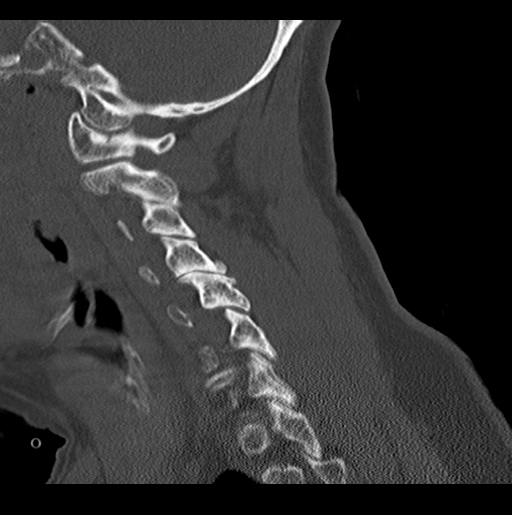
[im 26/61  bone]
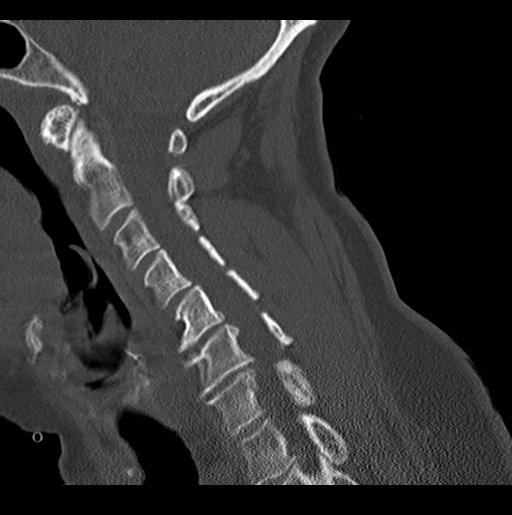
[im 31/61  soft-tissue]
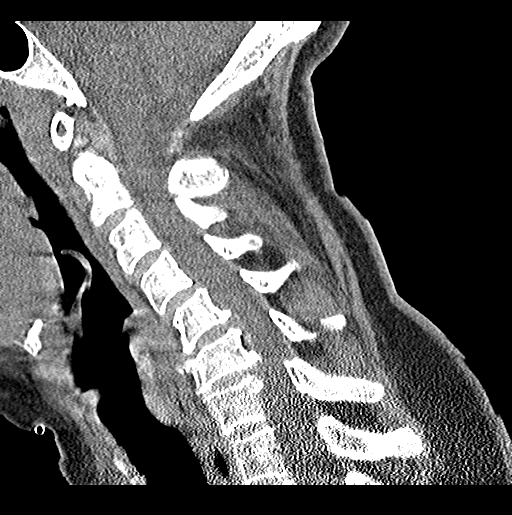
[im 31/61  bone]
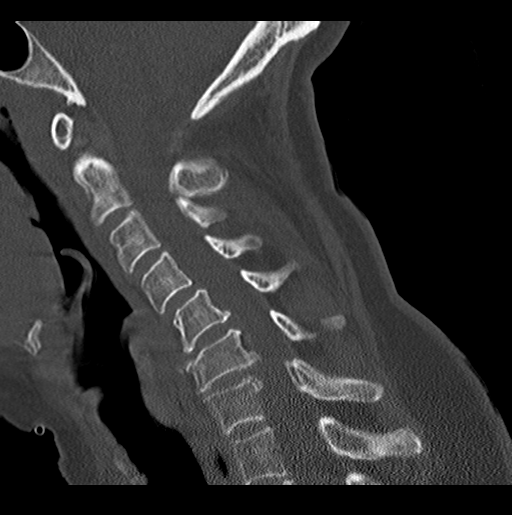
[im 36/61  bone]
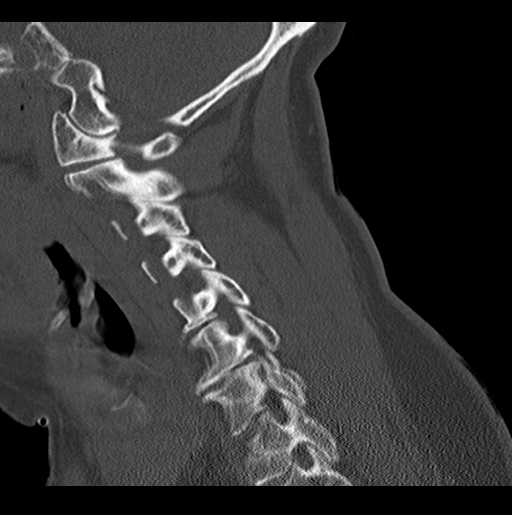
[im 41/61  bone]
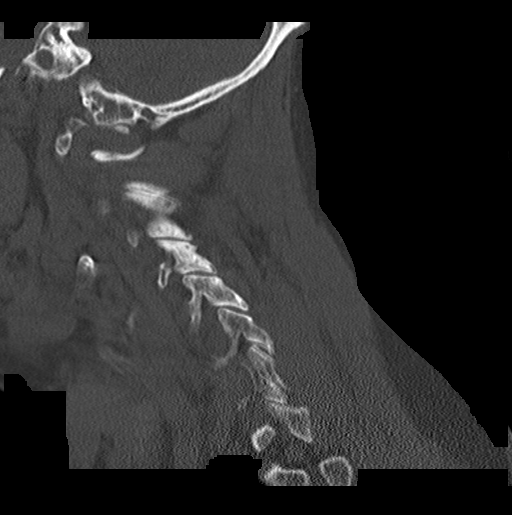

[Series 8: coronal bone · coronal · 0.23mm/px · 3 of 61 slices shown]
[im 14/61  bone]
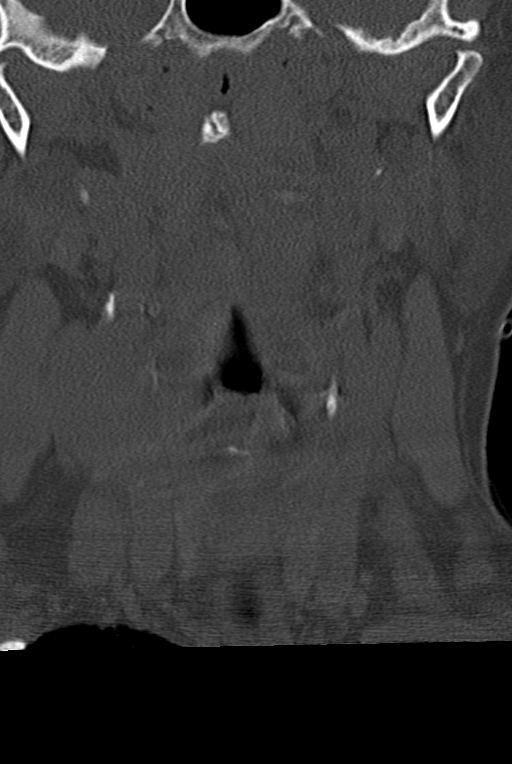
[im 25/61  bone]
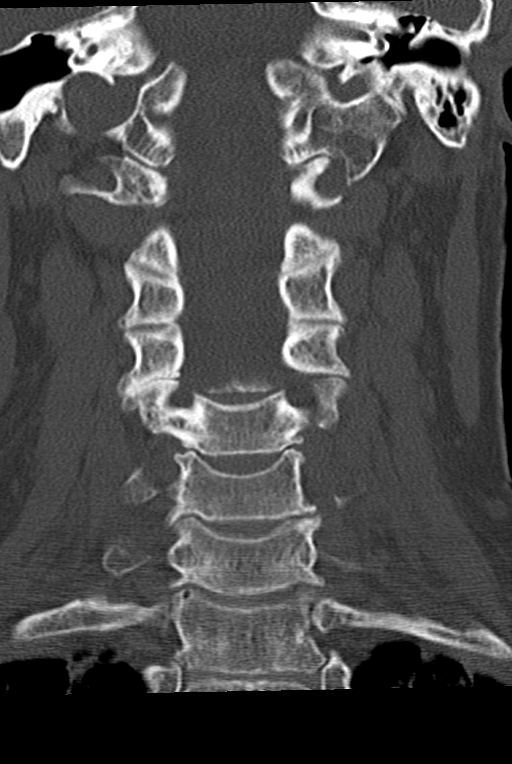
[im 36/61  bone]
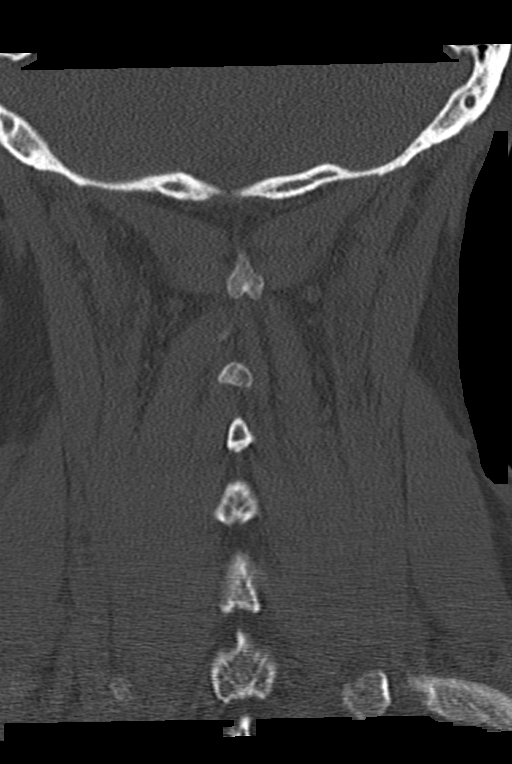

[Series 9: orthogonal axials · axial · 0.21mm/px · z∈[-25,+17]mm · 2 of 77 slices shown, 3 images]
[im 26/77  soft-tissue]
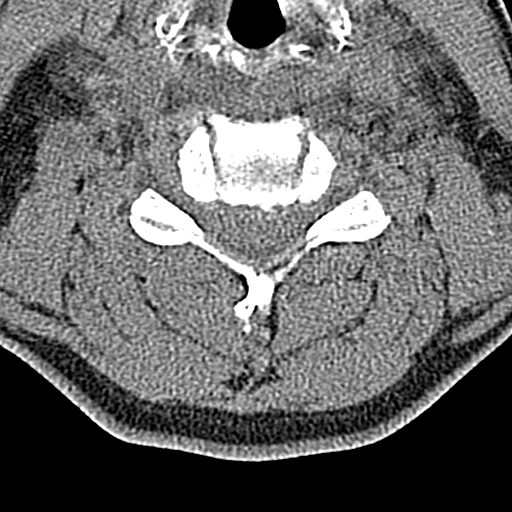
[im 26/77  bone]
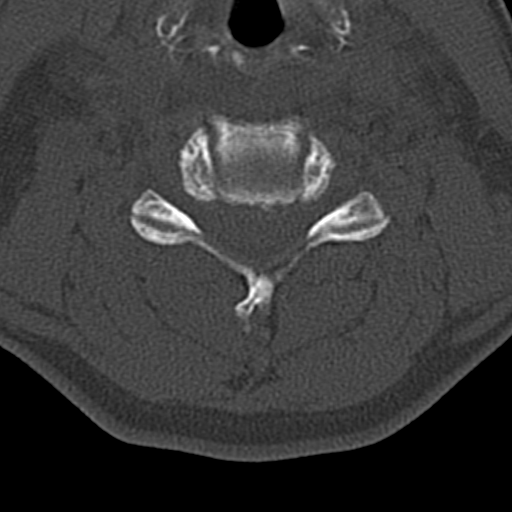
[im 51/77  bone]
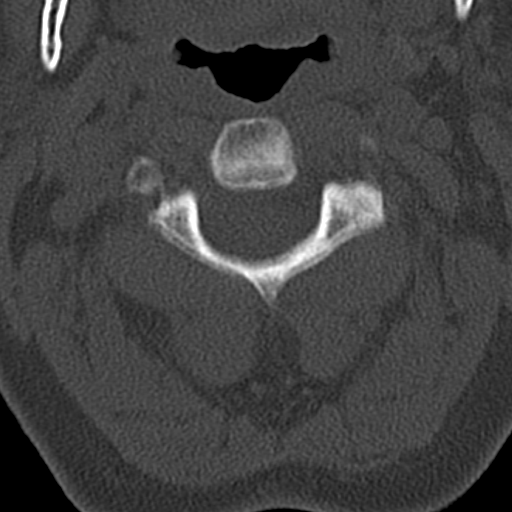

[Series 11: l spine soft · axial · 0.25mm/px · z∈[-468,-290]mm · 5 of 135 slices shown]
[im 23/135  soft-tissue]
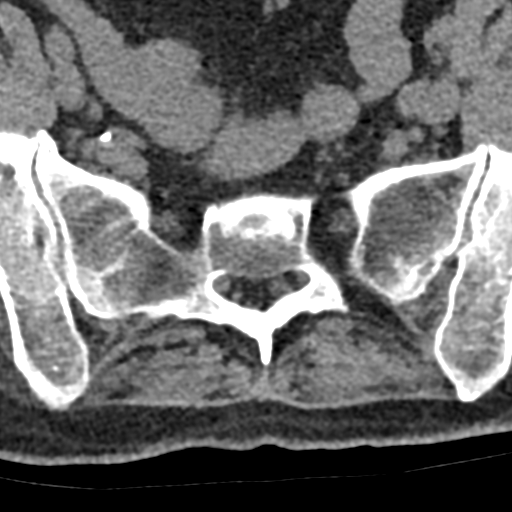
[im 45/135  soft-tissue]
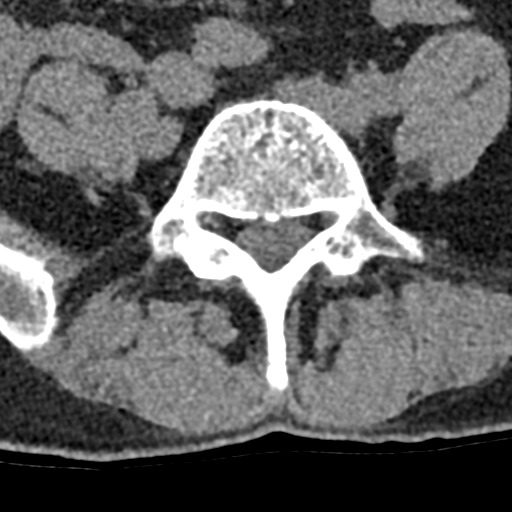
[im 68/135  soft-tissue]
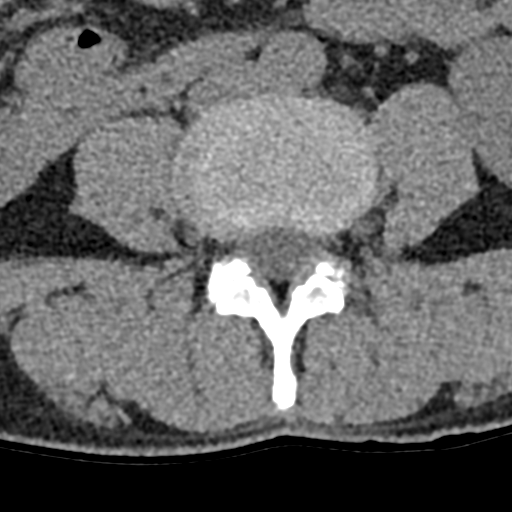
[im 90/135  soft-tissue]
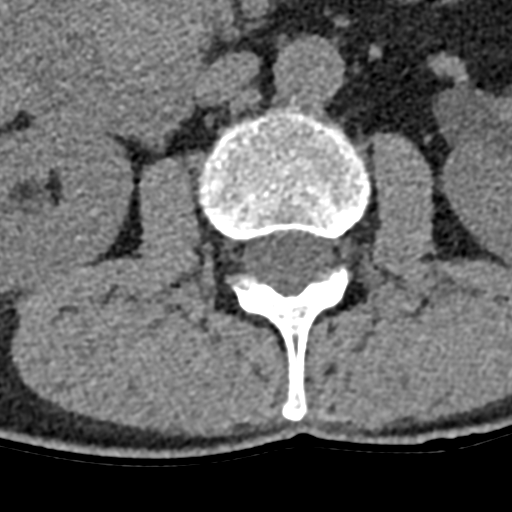
[im 112/135  soft-tissue]
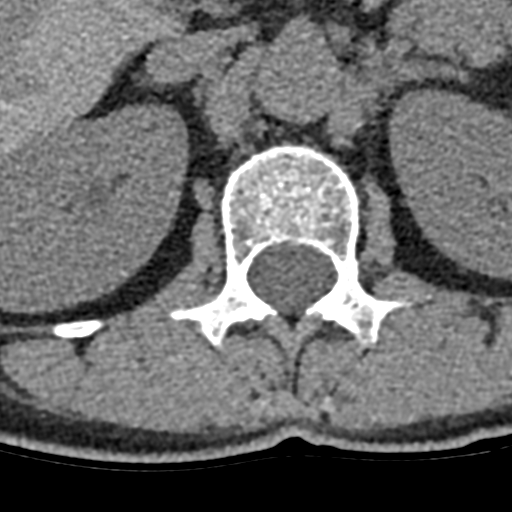

[15 of 33 positions shown; findings below may reference images not displayed]

FINDINGS: Alignment: Facet joints are aligned without dislocation or traumatic
listhesis. Dens and lateral masses are aligned. Straightening with
slight reversal of the cervical lordosis.

Skull base and vertebrae: No acute fracture. No primary bone lesion
or focal pathologic process. Congenital nonfusion of the anterior
and posterior arches of C1.

Soft tissues and spinal canal: No prevertebral fluid or swelling. No
visible canal hematoma.

Disc levels: Degenerative disc disease most pronounced at C5-6 and
C6-7. Facet arthropathy more pronounced on the right.

Upper chest: Emphysema and biapical pleuroparenchymal scarring.

Other: Bilateral carotid atherosclerosis.
IMPRESSION: 1. No acute fracture or traumatic listhesis of the cervical spine.
2. Degenerative disc disease most pronounced at C5-6 and C6-7.

Emphysema ([id number]).

## 2023-05-05 IMAGING — DX DG HIP (WITH OR WITHOUT PELVIS) 2-3V*L*
3 series · 3 of 3 positions shown · non-contrast
Comparison: 07/12/2020

CLINICAL DATA: Left hip pain after fall 2 days ago

EXAM:
DG HIP (WITH OR WITHOUT PELVIS) 2-3V LEFT

[pelvis ap]
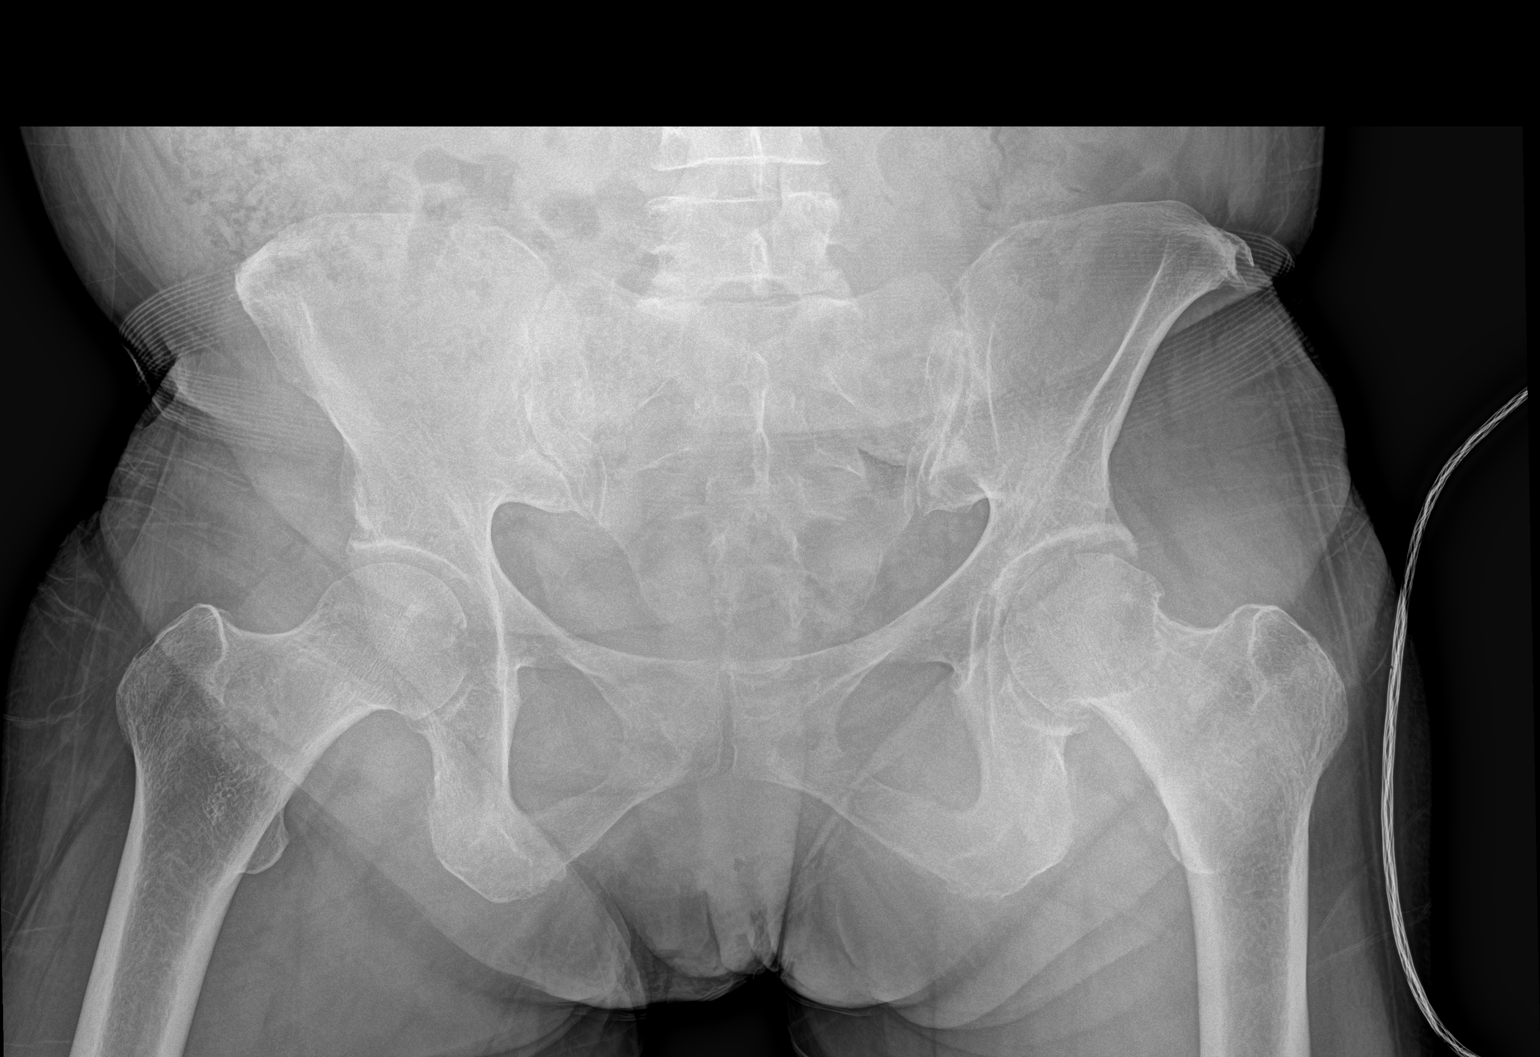

[hip ap]
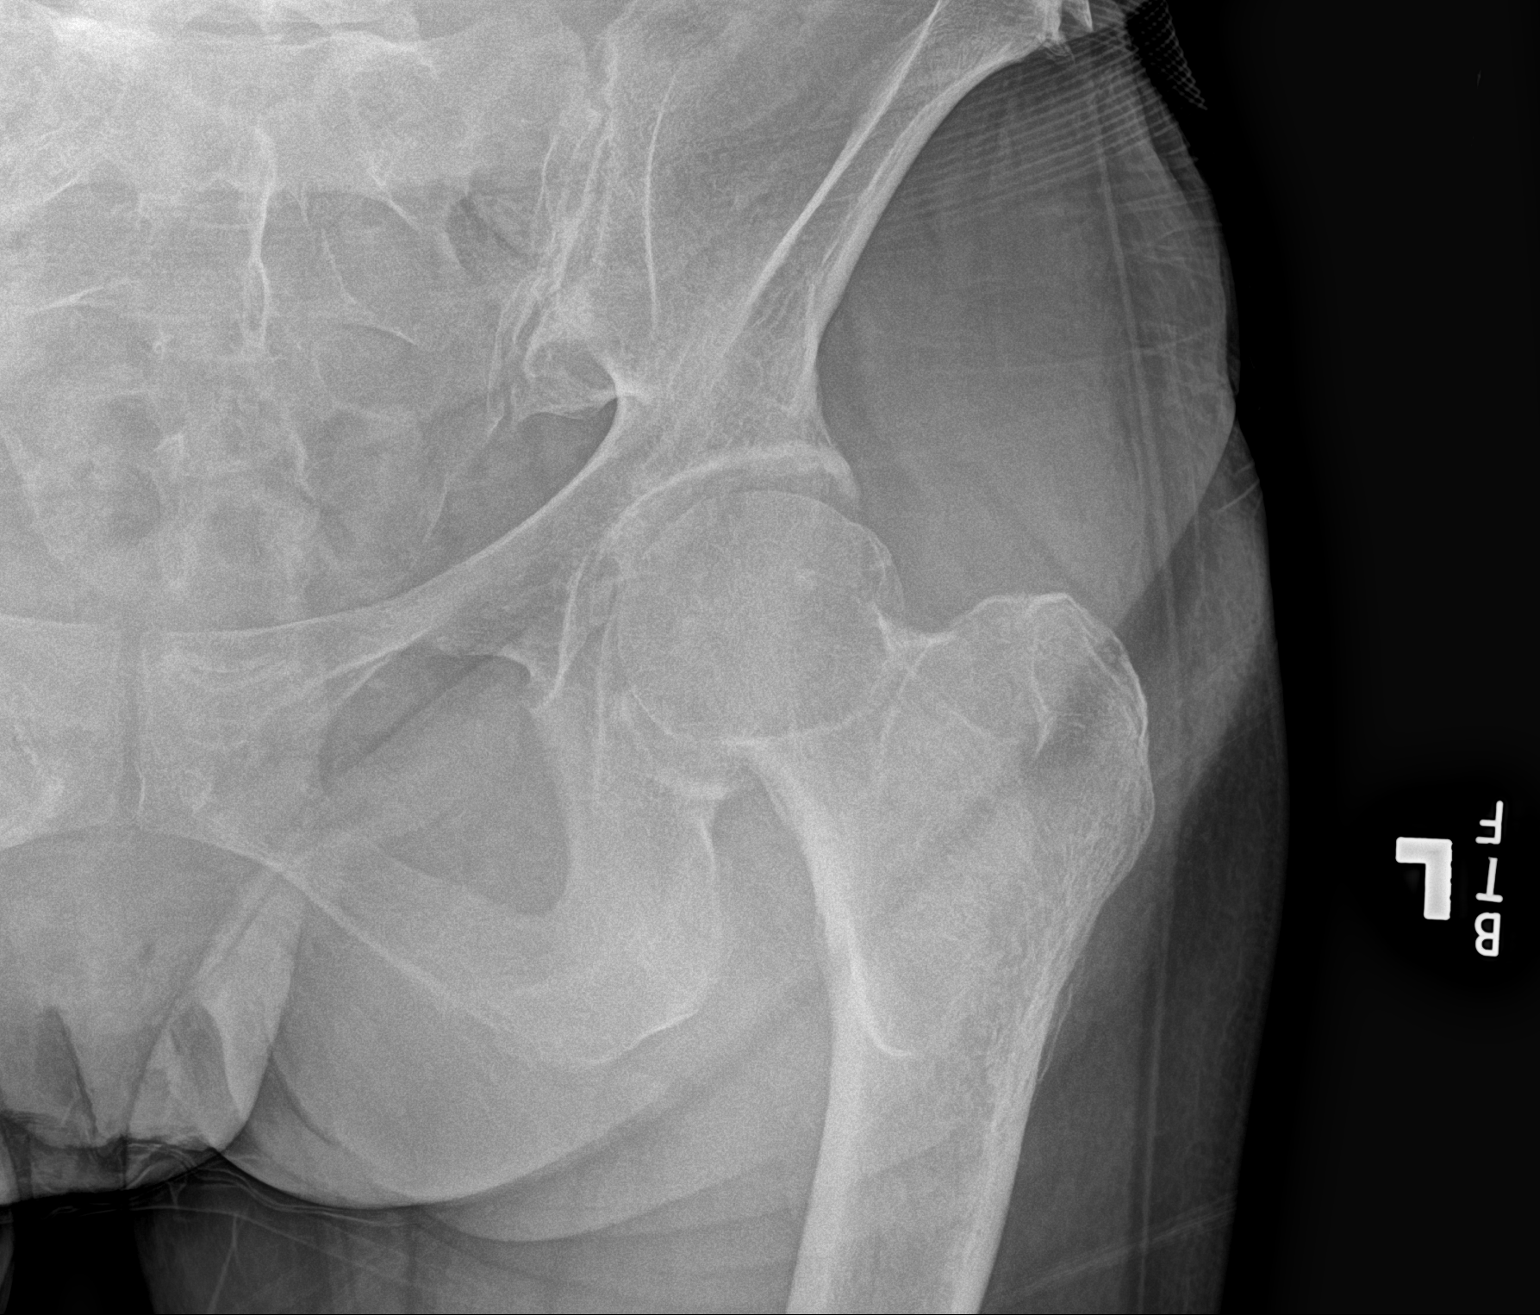

[hip lat]
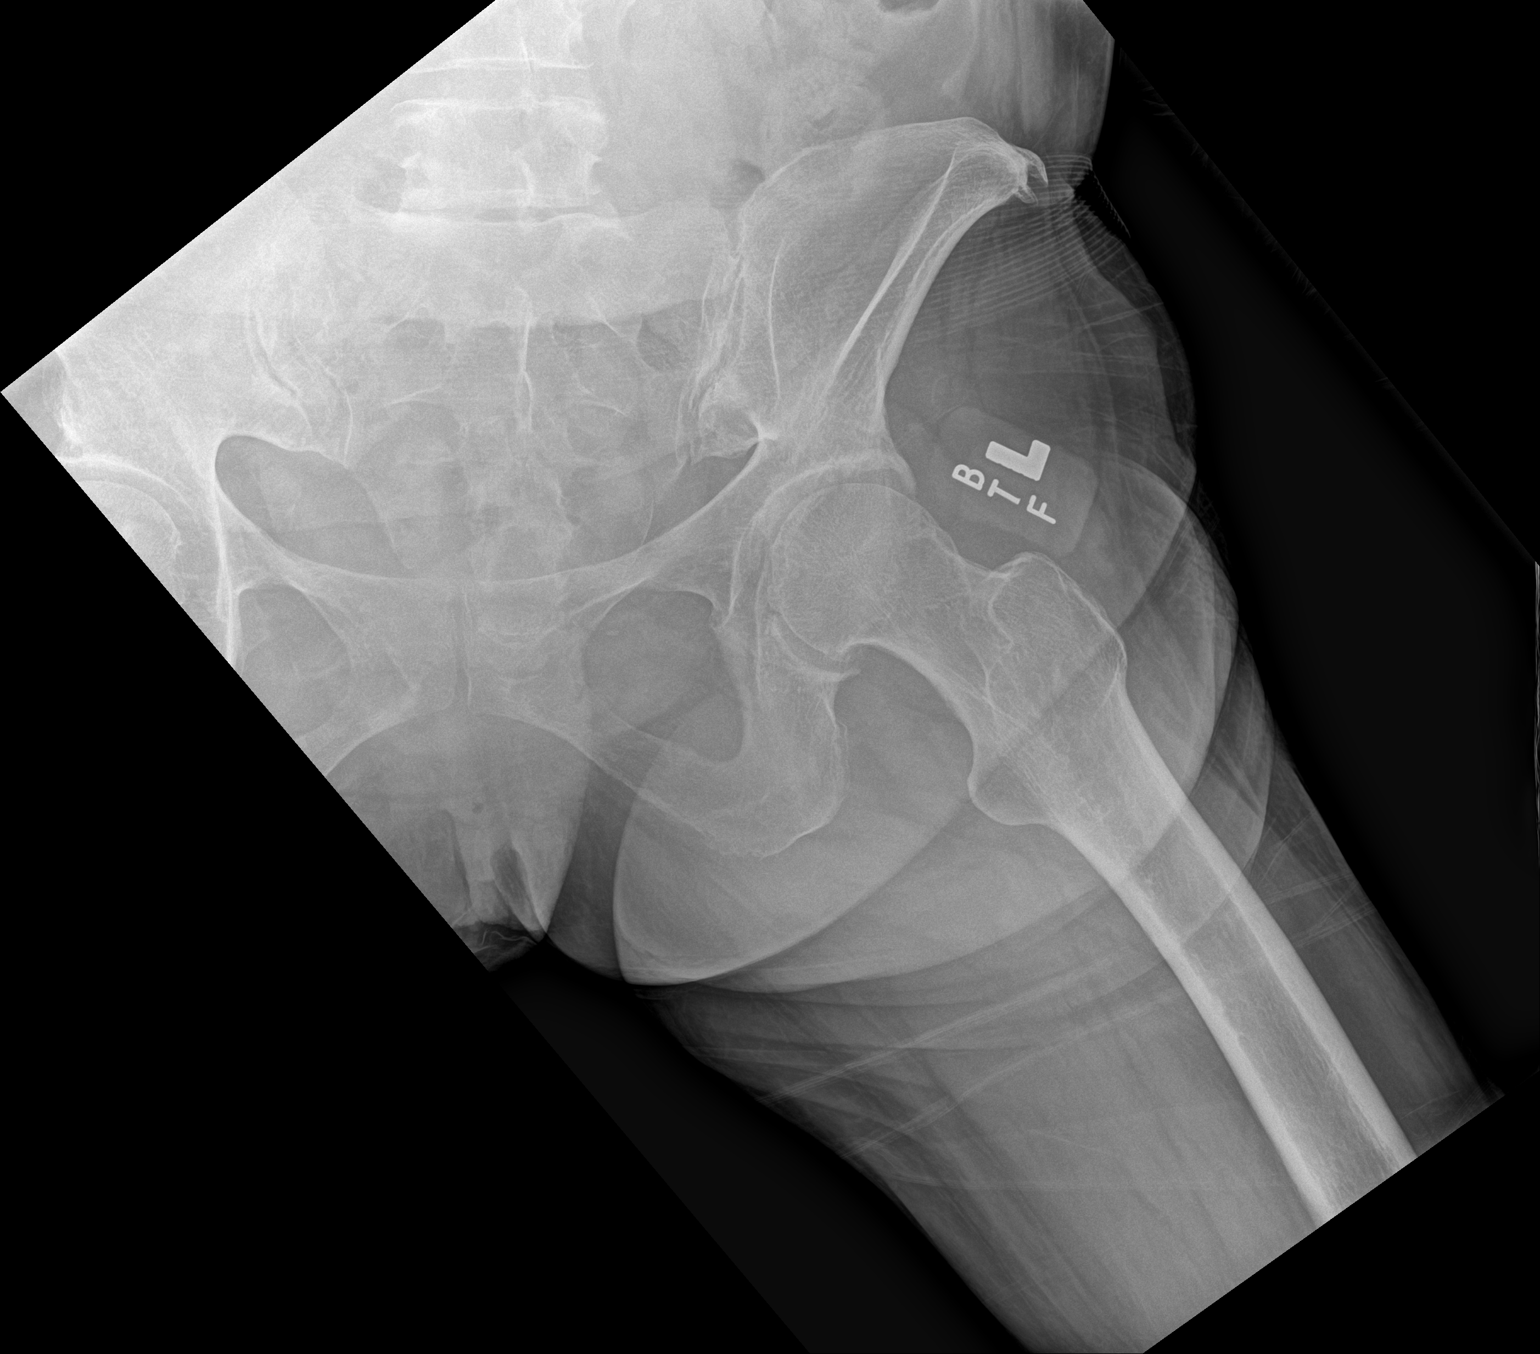

[3 of 3 positions shown; findings below may reference images not displayed]

FINDINGS: There is no evidence of hip fracture or dislocation. Mild
osteoarthritis of the left hip. No appreciable soft tissue
abnormality.
IMPRESSION: Negative.

## 2023-05-18 IMAGING — DX DG TIBIA/FIBULA 2V*R*
2 series · 2 of 2 positions shown · non-contrast
Comparison: Right knee done on 07/12/2020

CLINICAL DATA: Trauma, fall

EXAM:
RIGHT TIBIA AND FIBULA - 2 VIEW

[tibia ap]
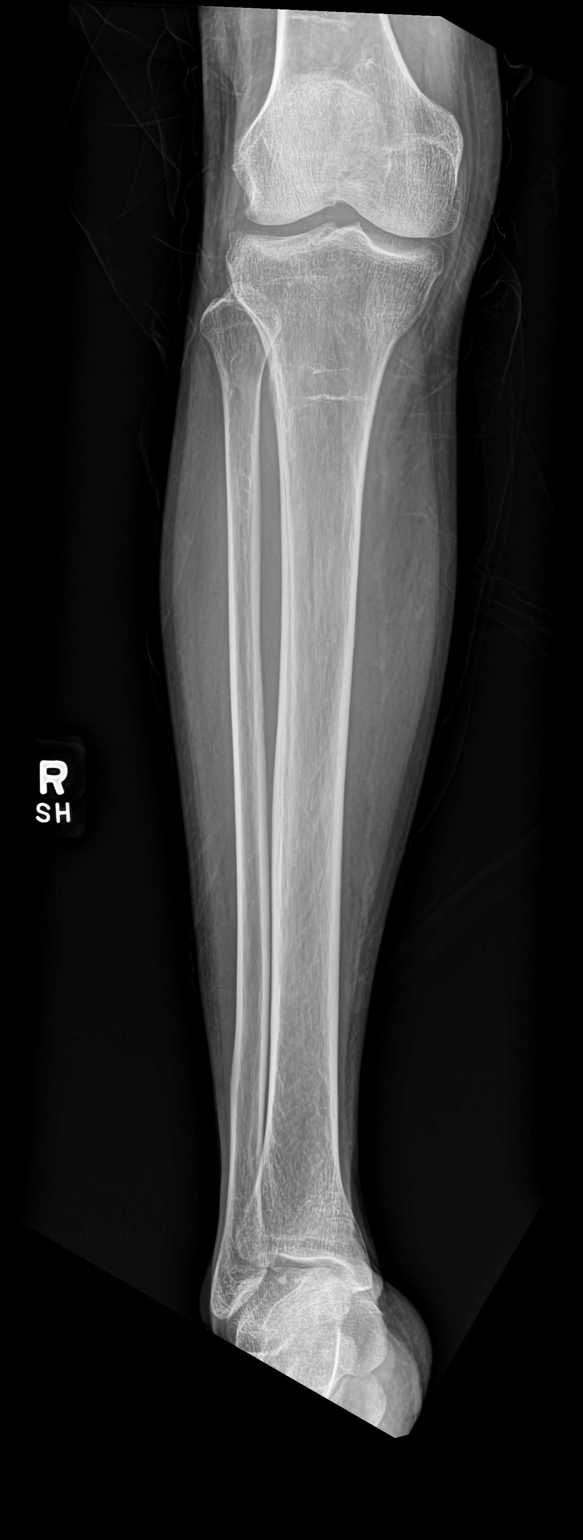

[tibia lat]
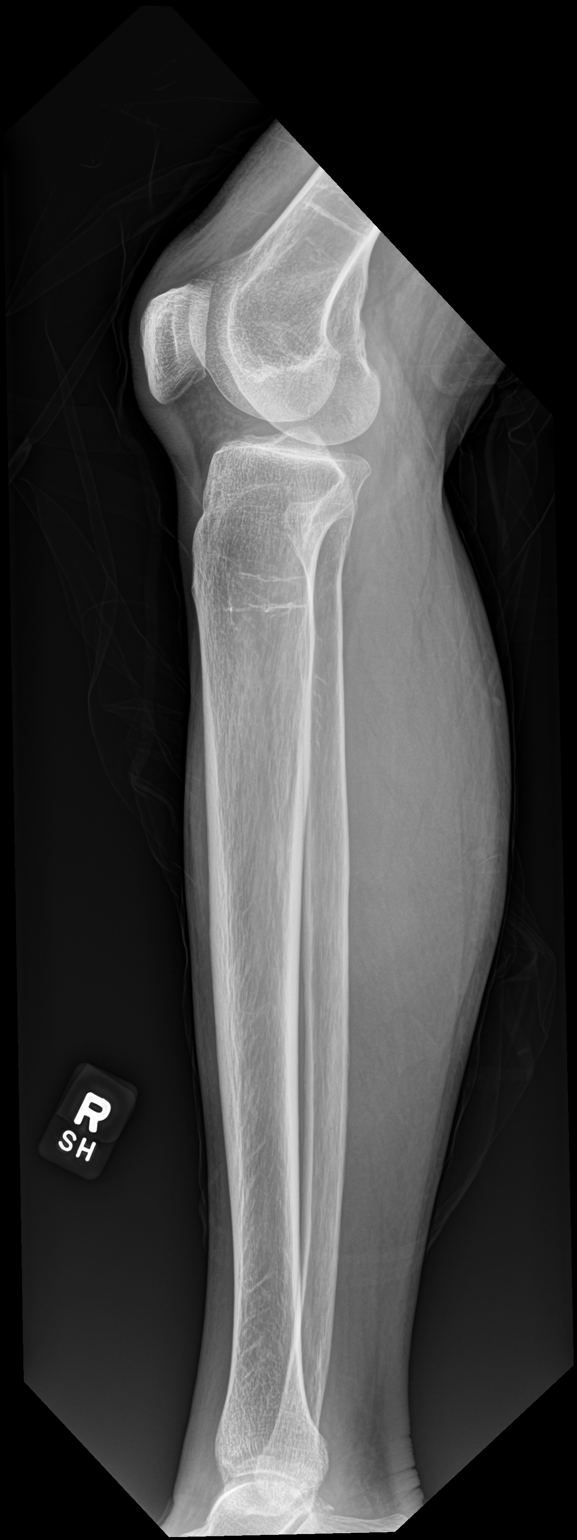

[2 of 2 positions shown; findings below may reference images not displayed]

FINDINGS: No fracture or dislocation is seen. There is no significant effusion
in the suprapatellar bursa.
IMPRESSION: No recent fracture is seen in the right tibia and fibula.

## 2023-05-18 IMAGING — DX DG PELVIS 1-2V
1 series · 1 of 1 positions shown · non-contrast
Comparison: None Available.

CLINICAL DATA: Trauma, fall

EXAM:
PELVIS - 1-2 VIEW

[pelvis ap]
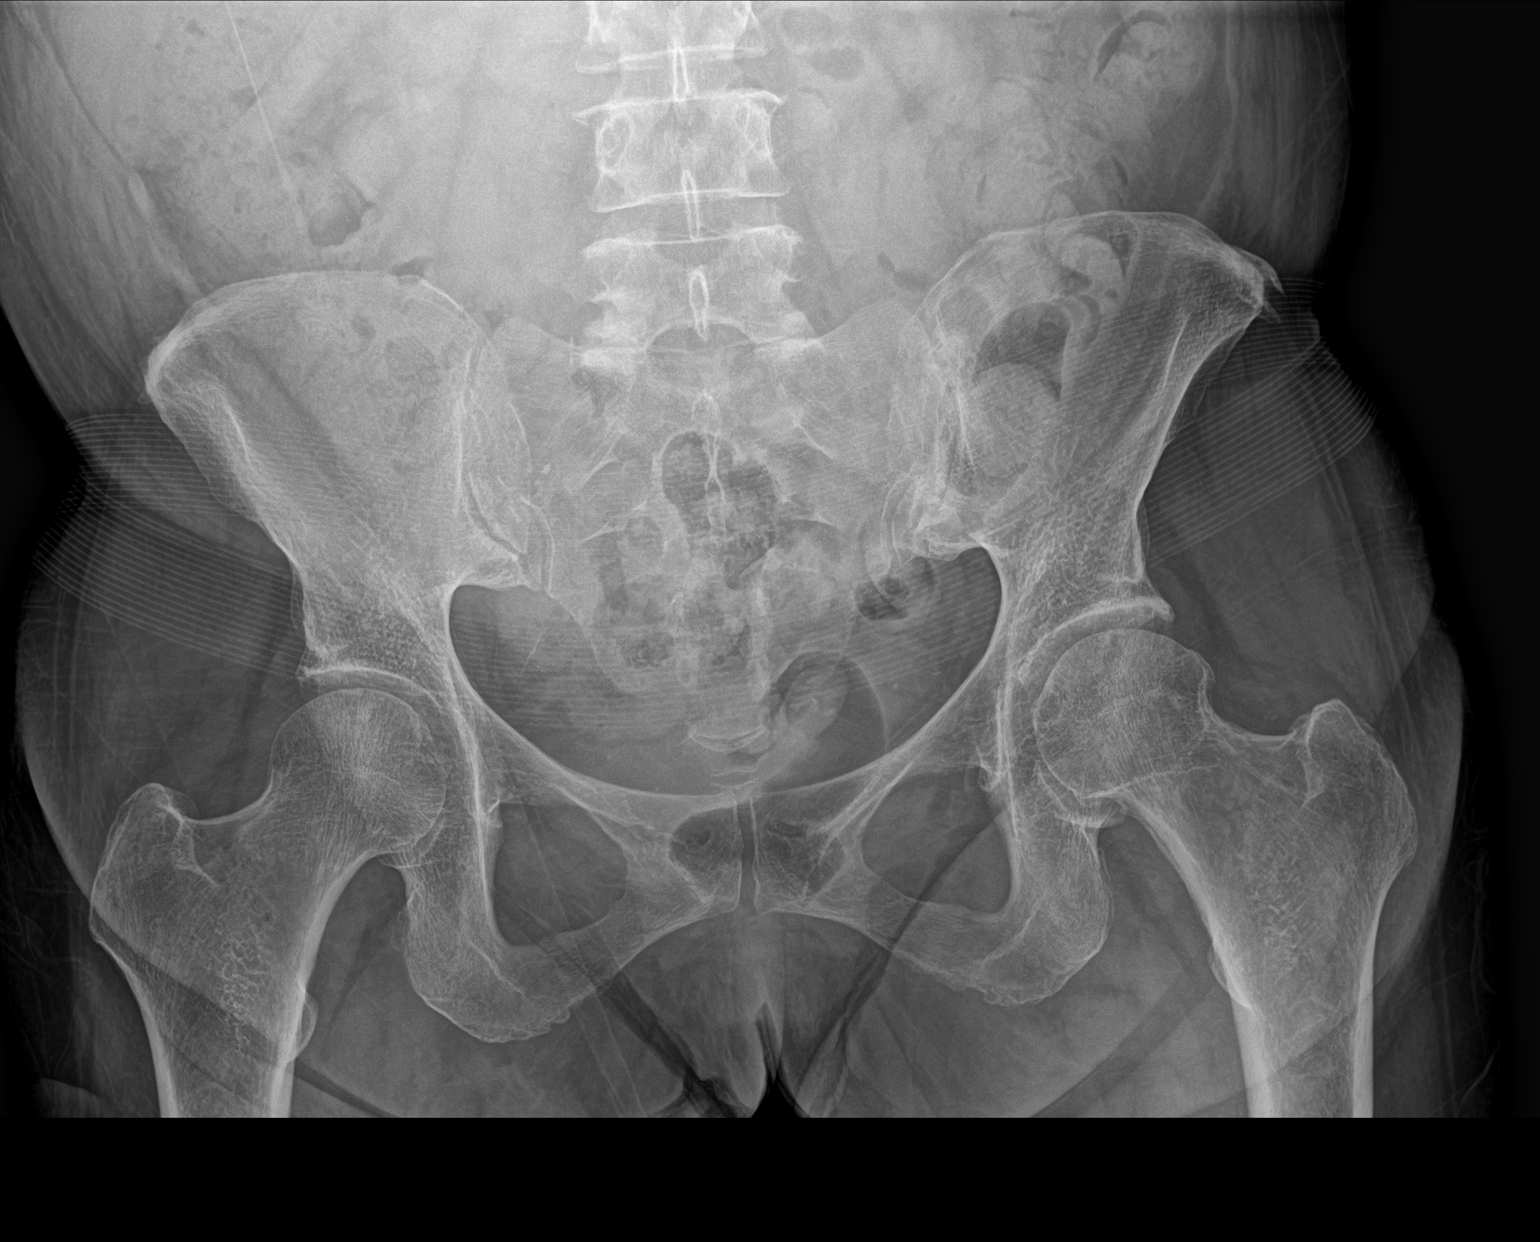

[1 of 1 positions shown; findings below may reference images not displayed]

FINDINGS: No recent fracture is seen. Degenerative changes with bony spurs are
noted in the left hip. Degenerative changes are noted in the SI
joints, more so on the left side.
IMPRESSION: No recent fracture or dislocation is seen. Degenerative changes are
noted in the left hip and SI joints.

## 2023-05-18 IMAGING — DX DG FEMUR 2+V*L*
4 series · 4 of 4 positions shown · non-contrast
Comparison: None Available.

CLINICAL DATA: Trauma, fall

EXAM:
LEFT FEMUR 2 VIEWS

[femur ap (1 of 2)]
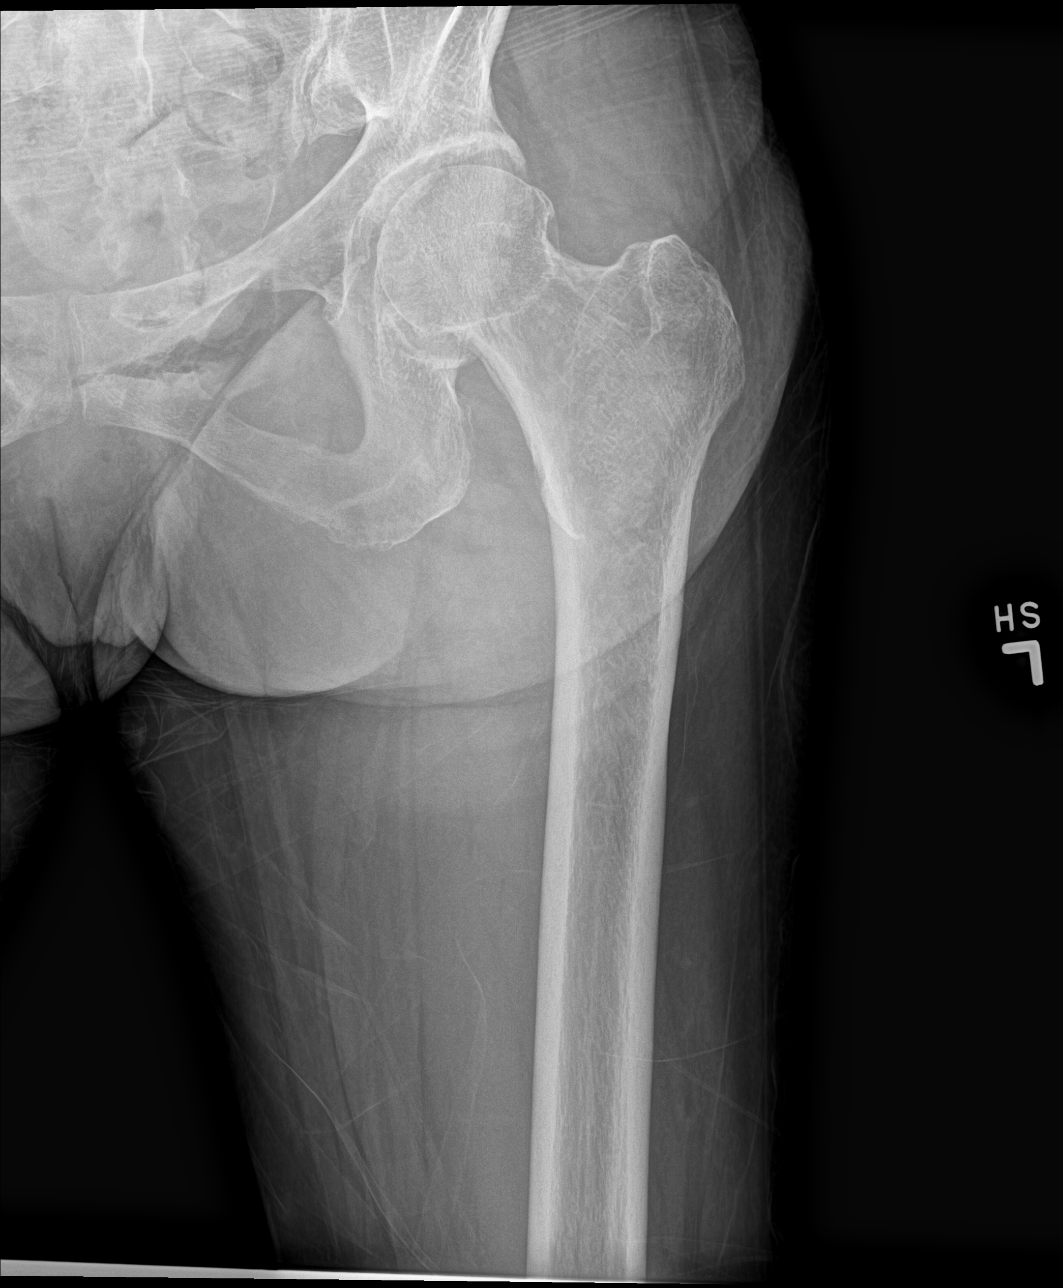

[femur ap (2 of 2)]
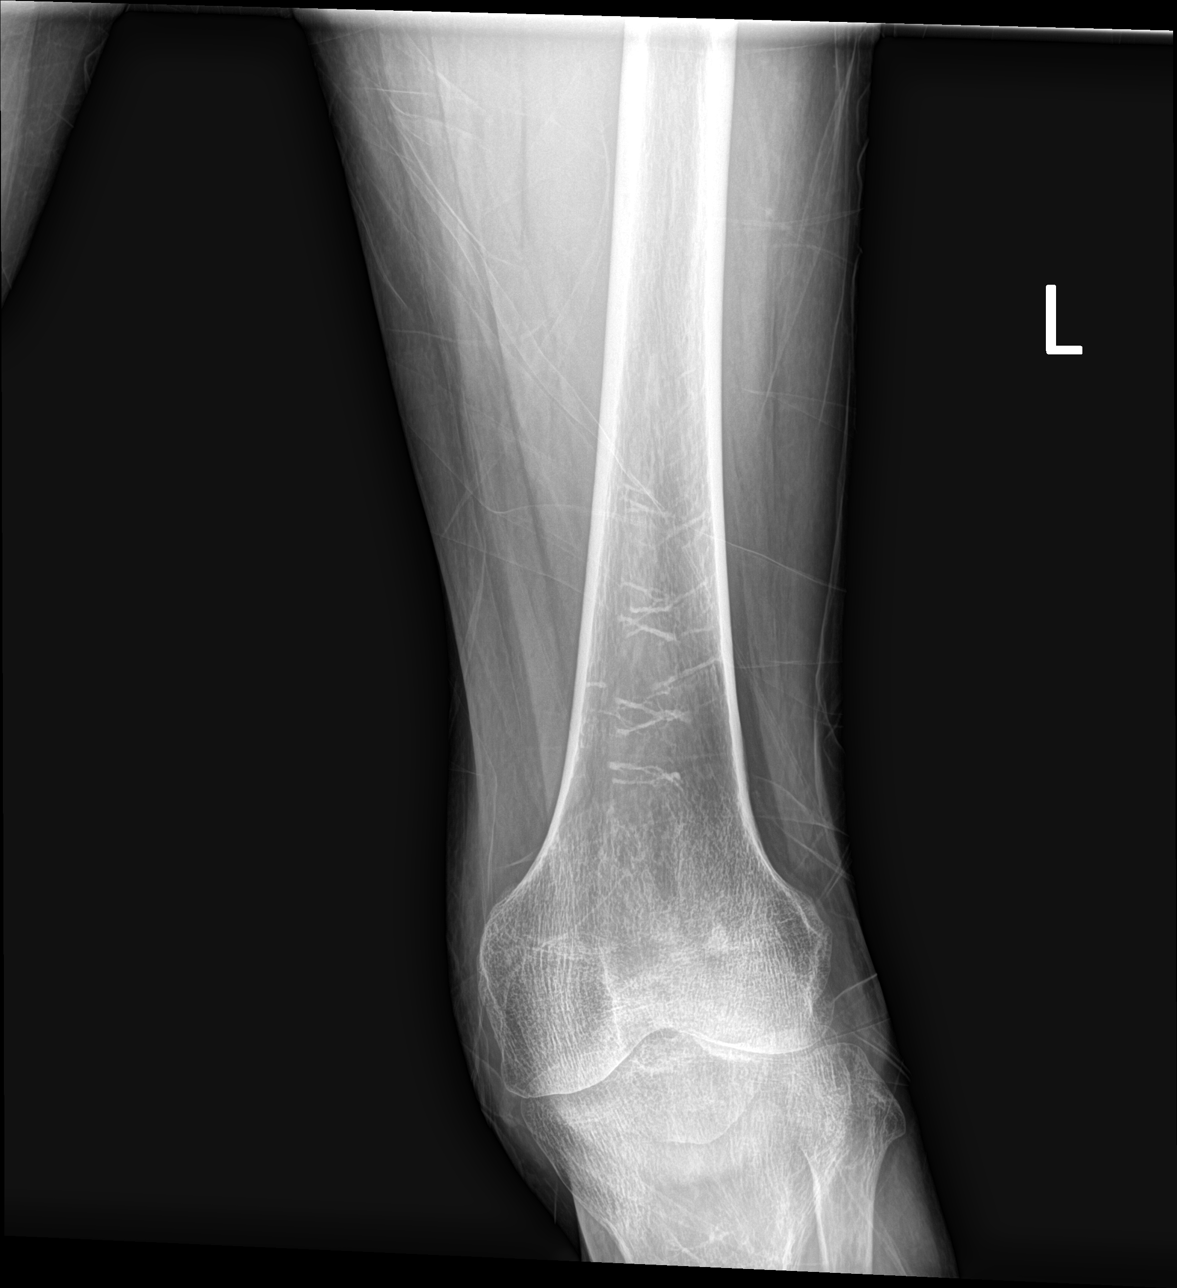

[femur lat (1 of 2)]
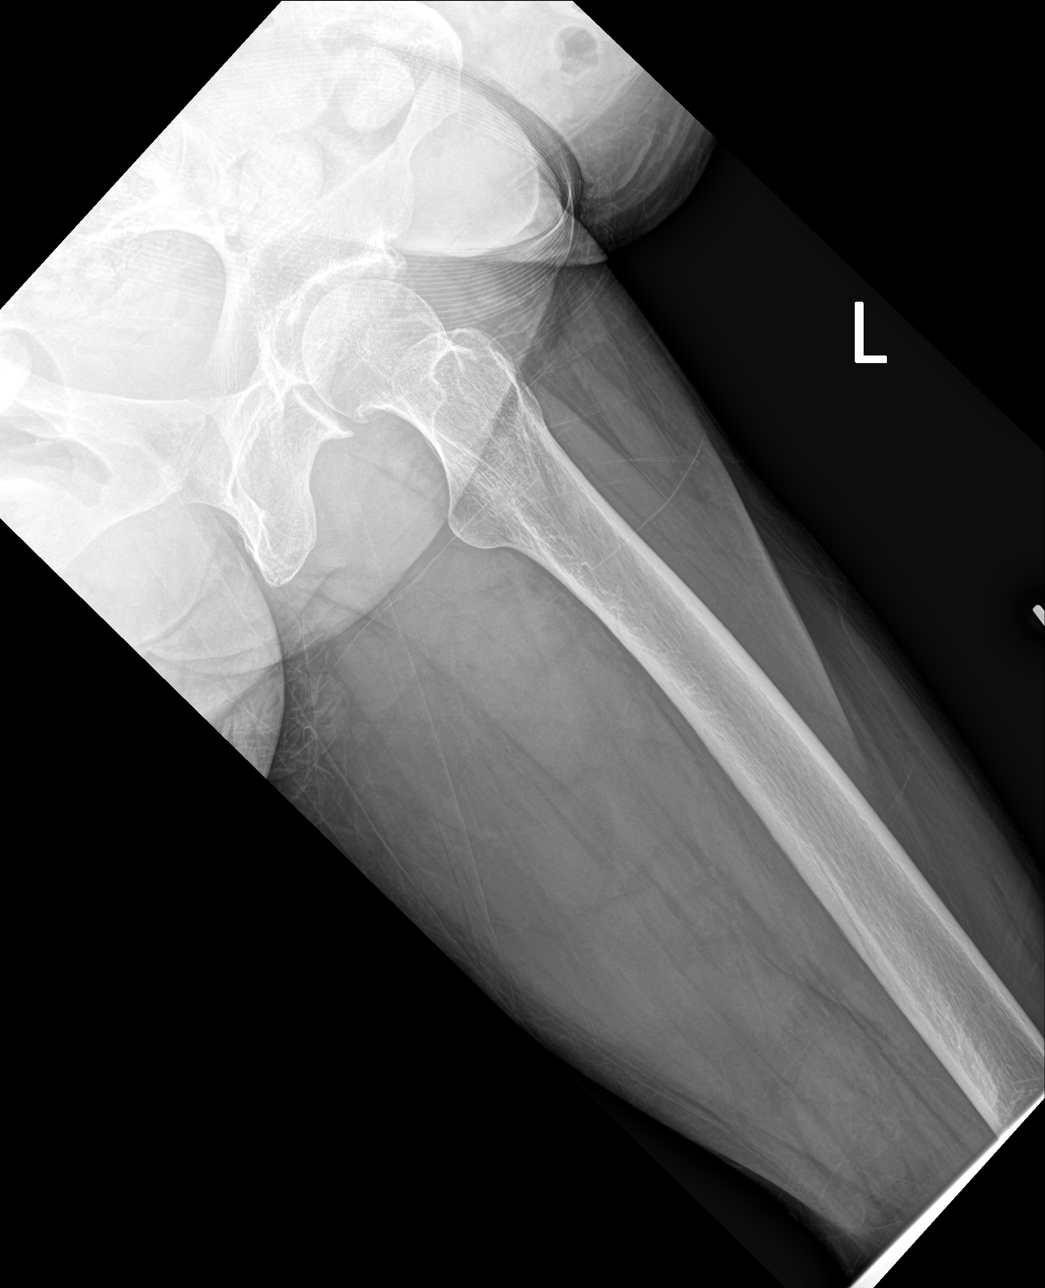

[femur lat (2 of 2)]
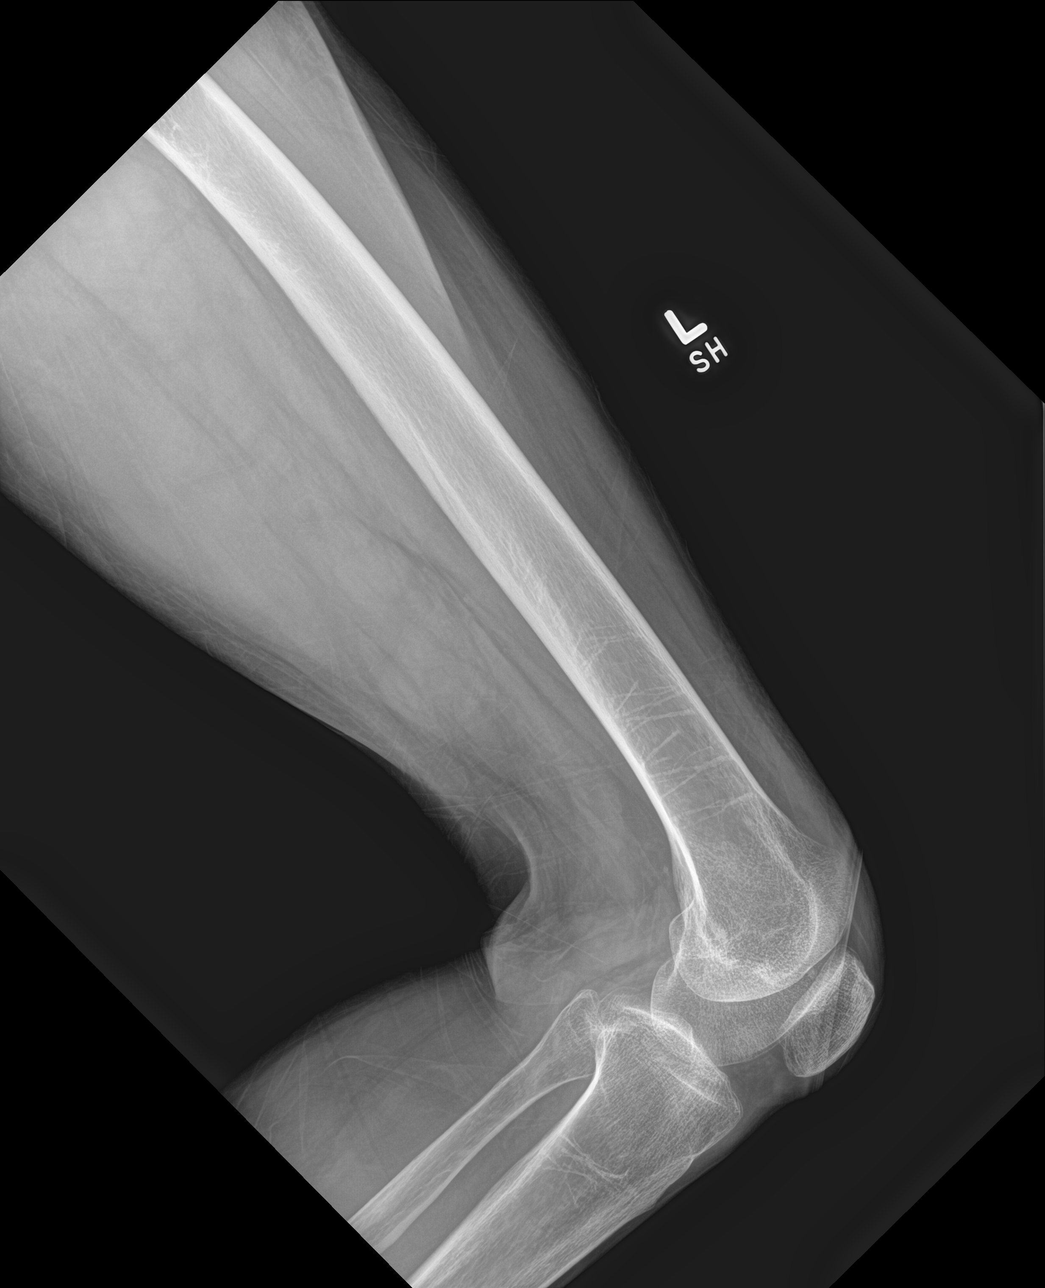

[4 of 4 positions shown; findings below may reference images not displayed]

FINDINGS: No recent fracture or dislocation is seen. Degenerative changes are
noted with bony spurs in the left hip. There is no effusion in the
left knee.
IMPRESSION: No recent fracture is seen in the left femur. Degenerative changes
are noted with bony spurs in the left hip.

## 2023-05-18 IMAGING — DX DG LUMBAR SPINE COMPLETE 4+V
5 series · 5 of 5 positions shown · non-contrast
Comparison: 09/09/2010

CLINICAL DATA: Trauma, fall

EXAM:
LUMBAR SPINE - COMPLETE 4+ VIEW

[l-spine ap]
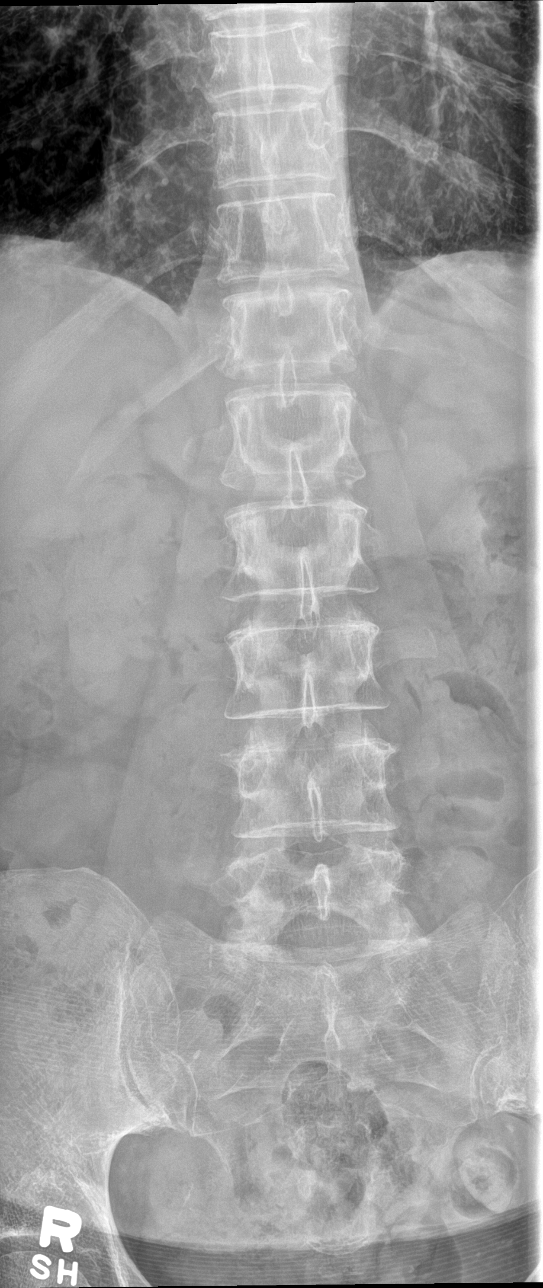

[l-spine obl (1 of 2)]
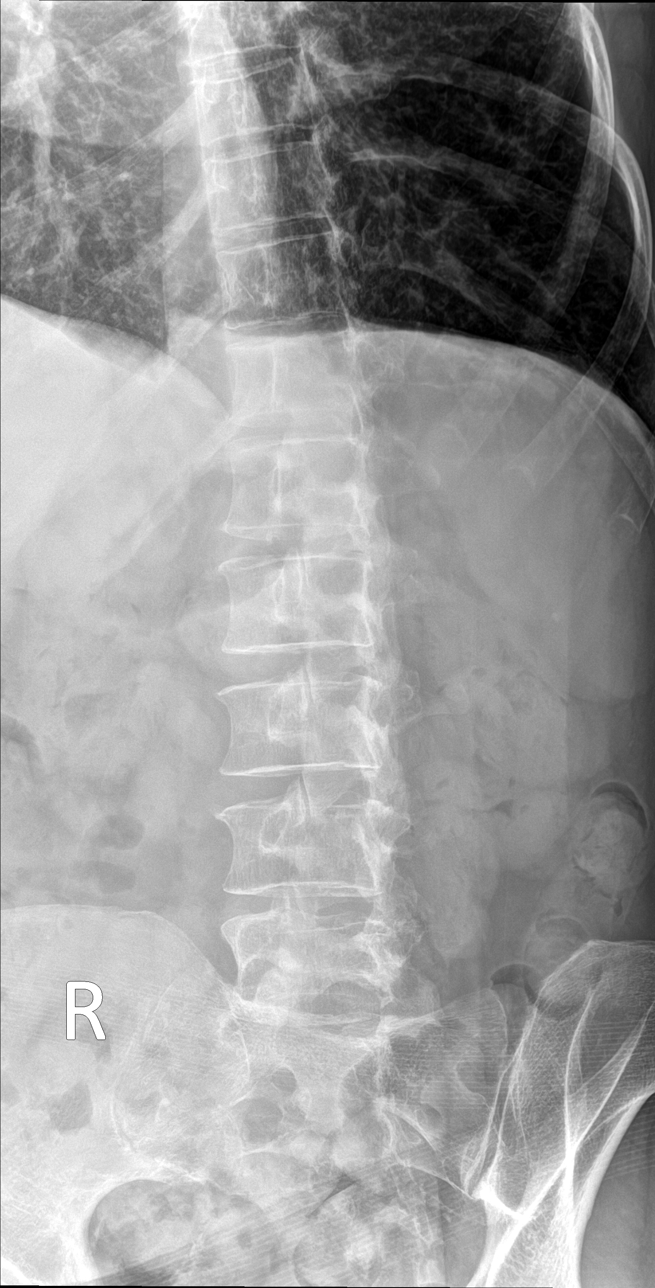

[l-spine obl (2 of 2)]
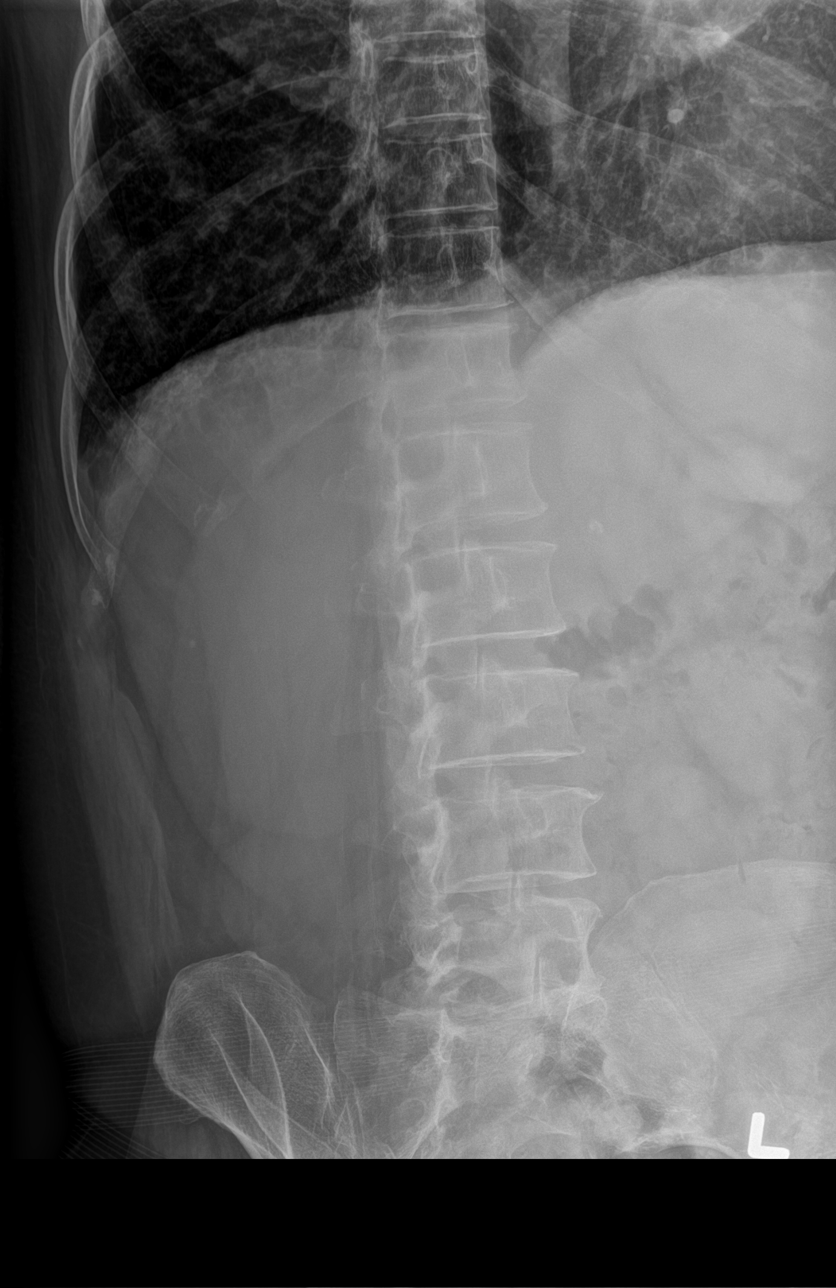

[l-spine lat]
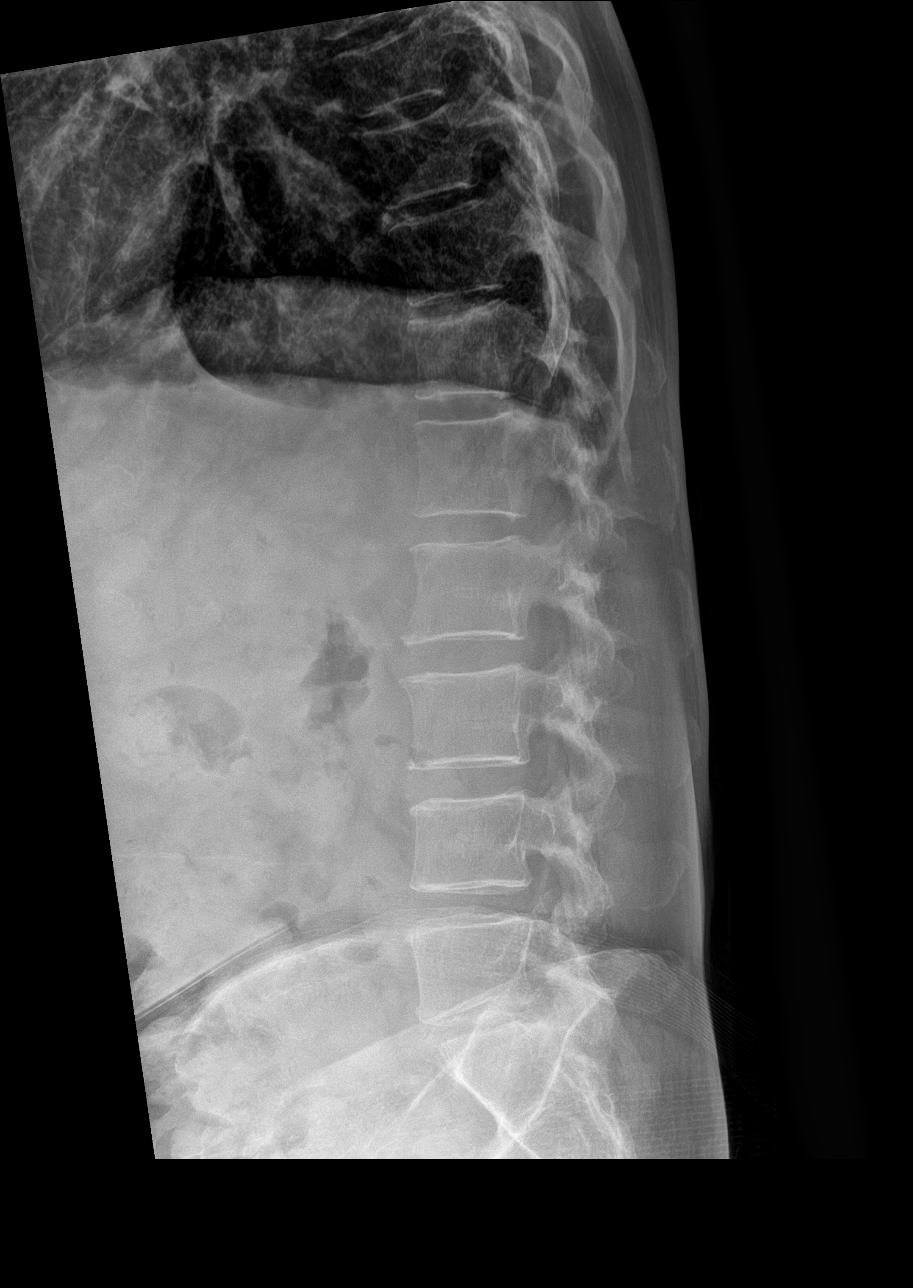

[l-spine spot]
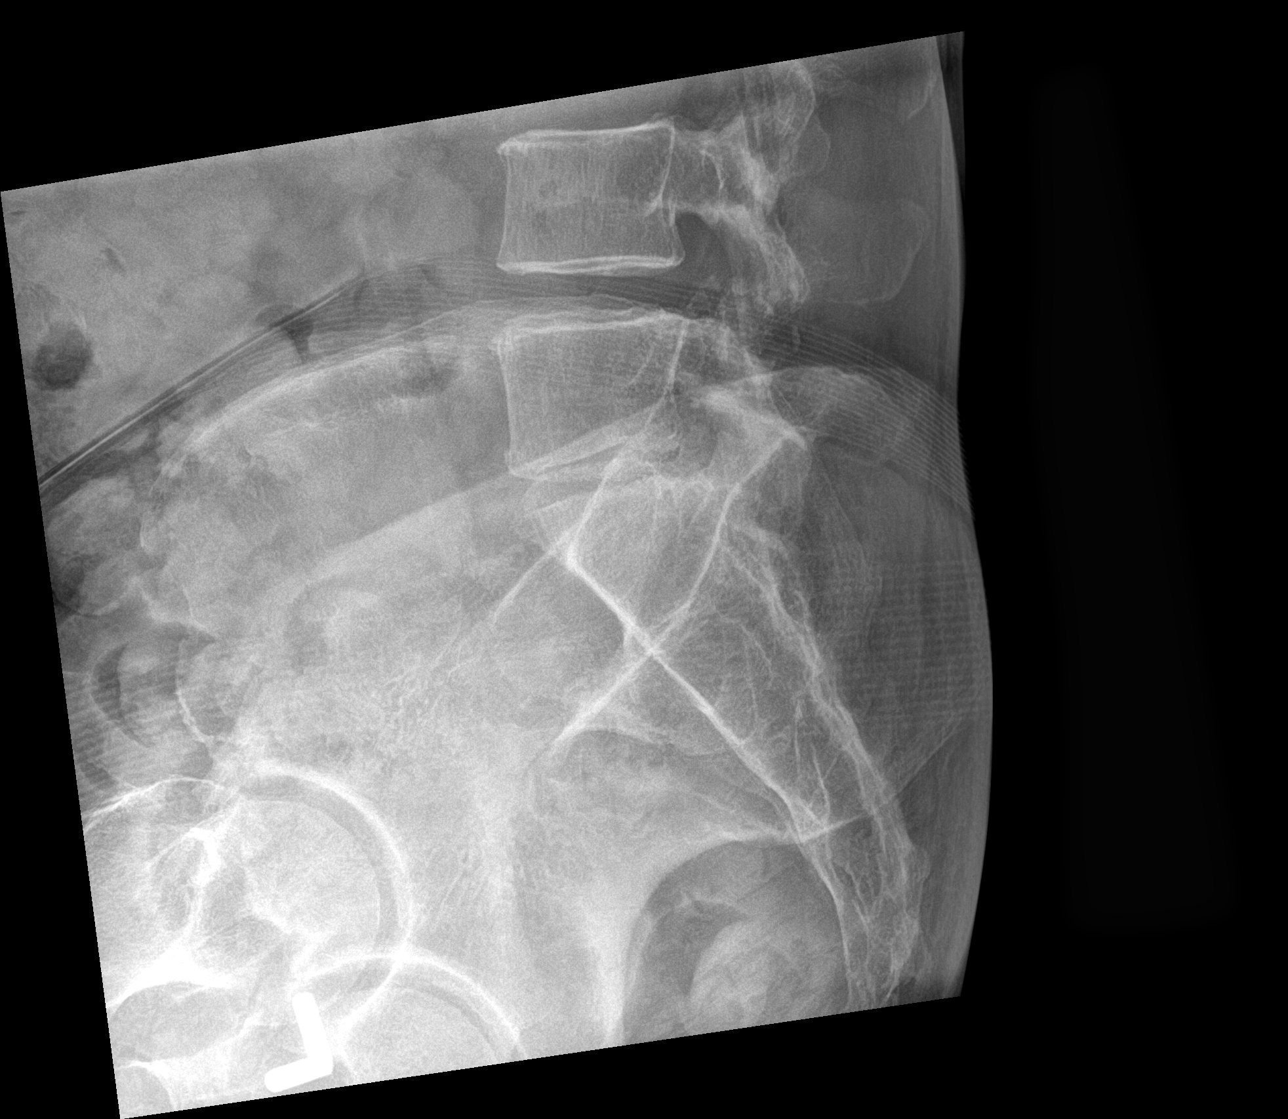

[5 of 5 positions shown; findings below may reference images not displayed]

FINDINGS: No recent fracture is seen in the lumbar spine. There is mild
decrease in height of upper endplate of body of T12 vertebra. This
finding was not seen in the previous study. Alignment of posterior
margins of vertebral bodies is unremarkable. Degenerative changes
are noted with disc space narrowing and bony spurs at L5-S1 level.
Paraspinal soft tissues are unremarkable.
IMPRESSION: There is mild 10-20% decrease in height of upper endplate of body of
T12 vertebra which may suggest recent or old compression fracture.
Lumbar spondylosis at L5-S1 level.

## 2023-05-18 IMAGING — DX DG CHEST 2V
2 series · 2 of 2 positions shown · non-contrast
Comparison: 04/11/2022

CLINICAL DATA: Trauma, fall, pain

EXAM:
CHEST - 2 VIEW

[chest pa]
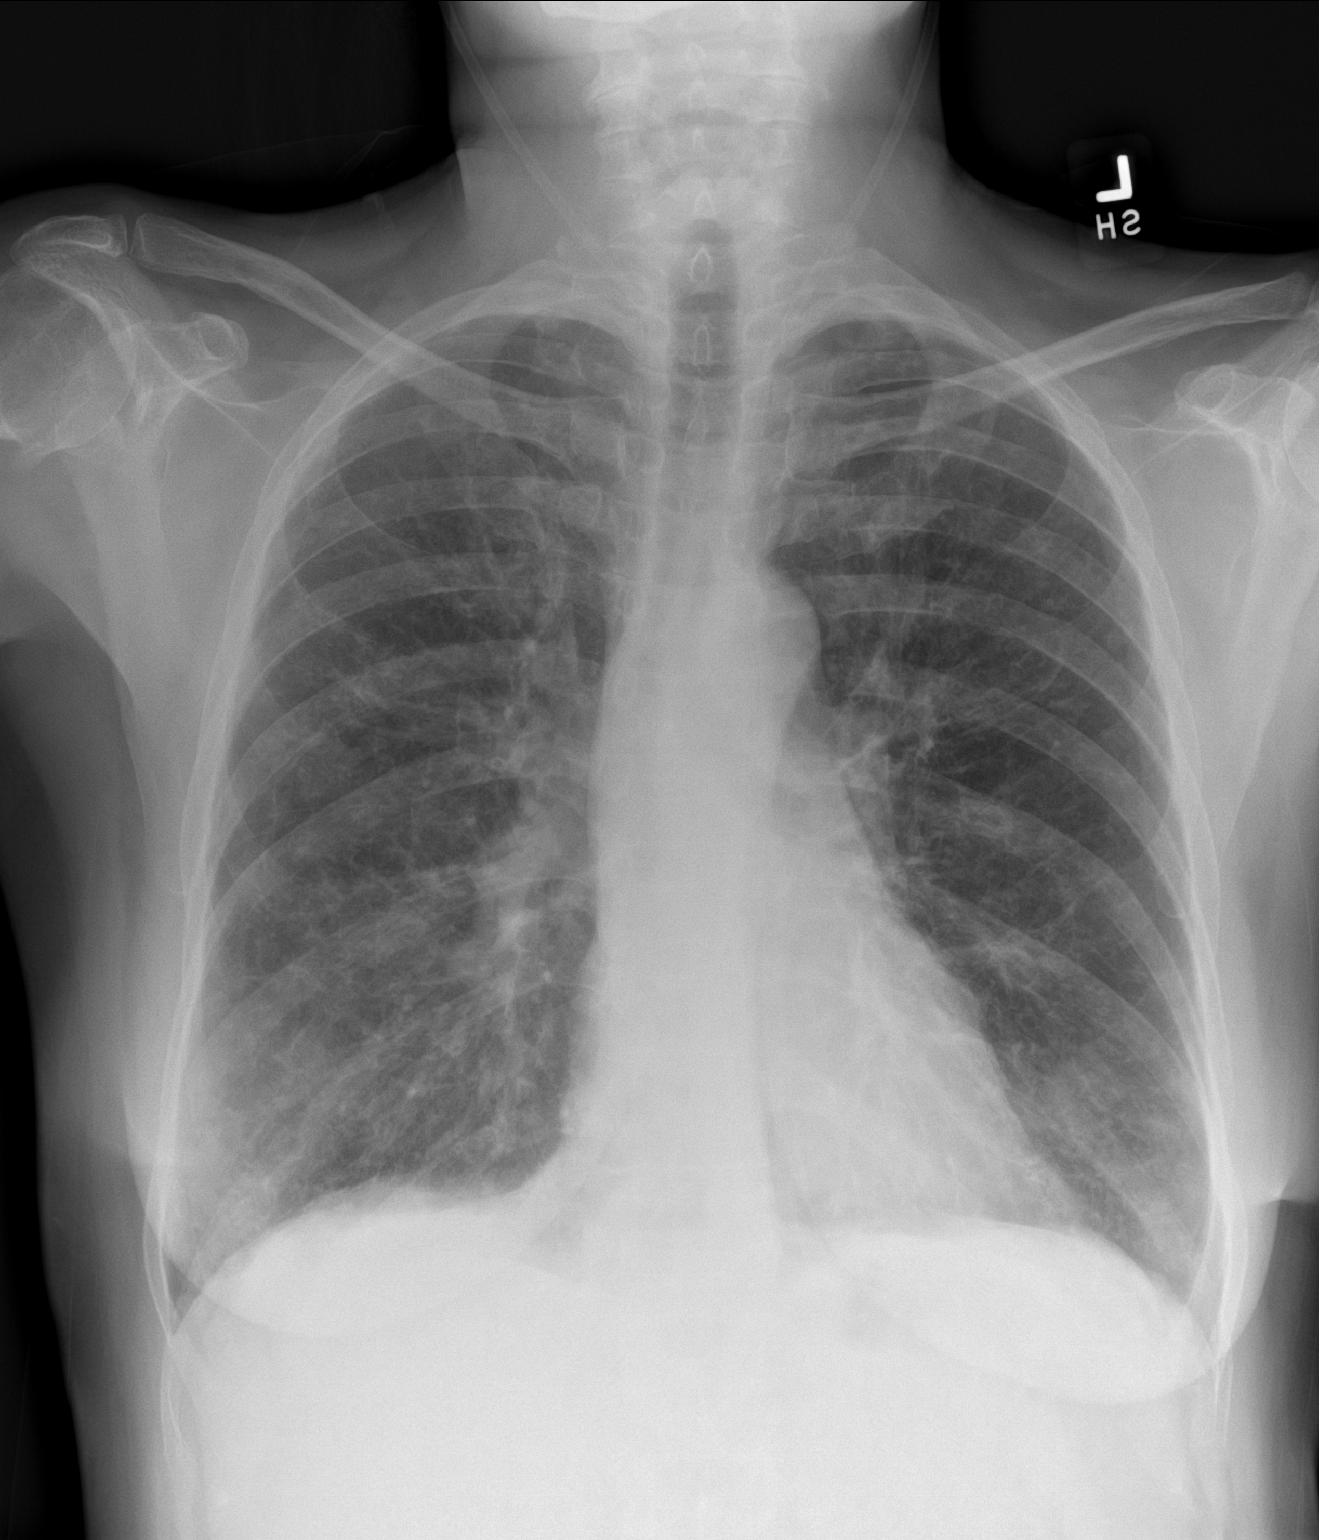

[chest lat]
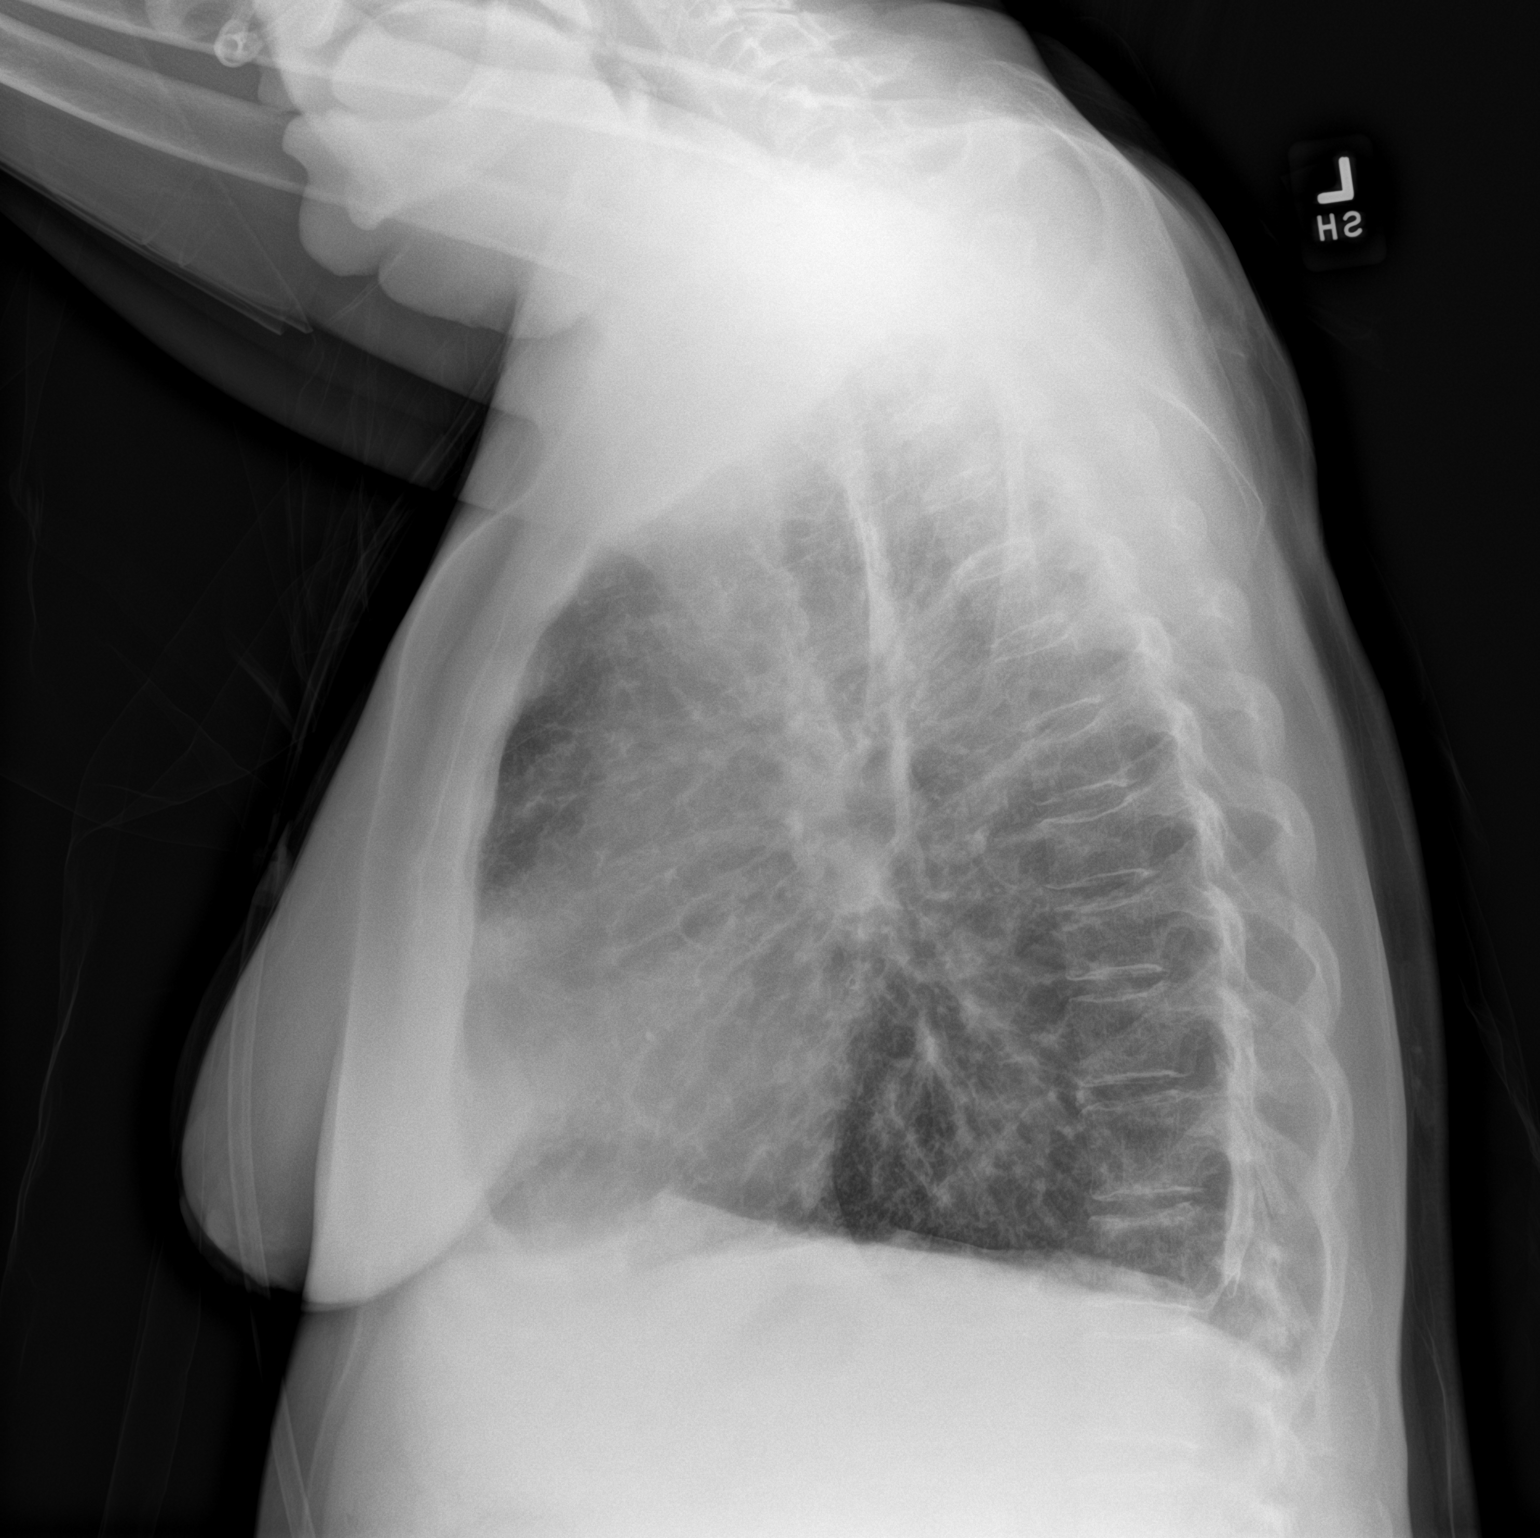

[2 of 2 positions shown; findings below may reference images not displayed]

FINDINGS: Cardiac size is within normal limits. Low position of diaphragms may
suggest COPD. There is subtle prominence of interstitial markings in
both lungs, more so on the right side. There is no new focal
consolidation. There is no pleural effusion or pneumothorax.
IMPRESSION: Slight prominence of interstitial markings in both lungs may suggest
scarring. There are no new infiltrates or signs of pulmonary edema.

## 2023-05-25 IMAGING — CT CT ANGIO CHEST-ABD-PELV FOR DISSECTION W/ AND WO/W CM
2 of 7 series · 12 of 46 positions shown, 14 images · IV contrast (Omnipaque or Isovue)
Comparison: 04/19/2022, 08/17/2021

CLINICAL DATA: Pain above left breast for 2 days

EXAM:
CT ANGIOGRAPHY CHEST, ABDOMEN AND PELVIS
TECHNIQUE: Non-contrast CT of the chest was initially obtained.

[Series 5: axial arterial · axial · arterial · 0.81mm/px · z∈[+937,+1486]mm · 9 of 215 slices shown, 11 images]
[im 16/215  soft-tissue]
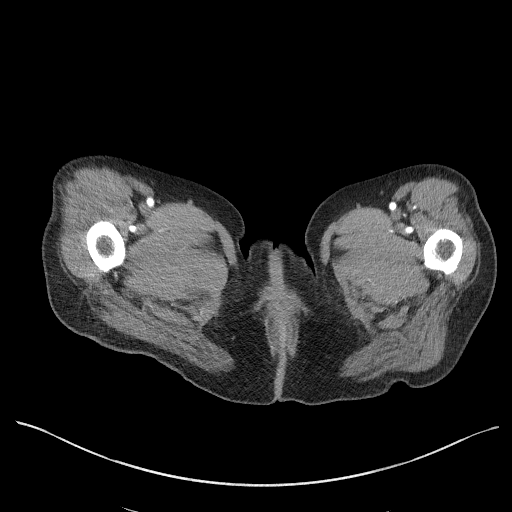
[im 16/215  bone]
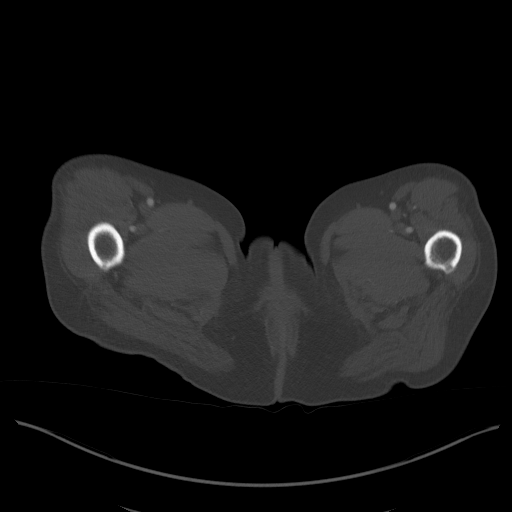
[im 46/215  soft-tissue]
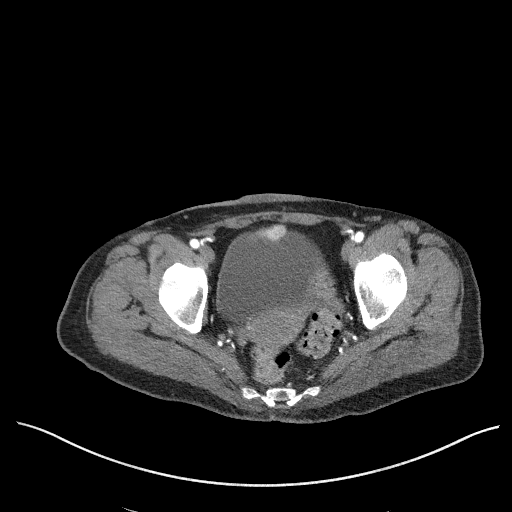
[im 62/215  soft-tissue]
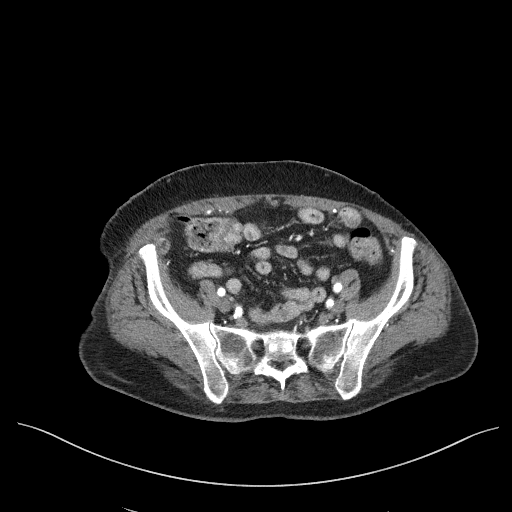
[im 92/215  soft-tissue]
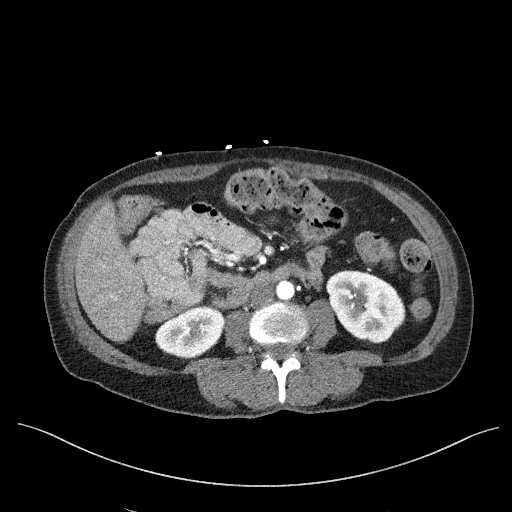
[im 108/215  soft-tissue]
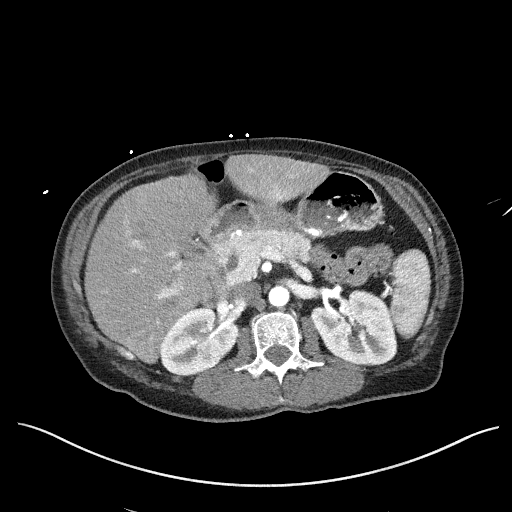
[im 123/215  soft-tissue]
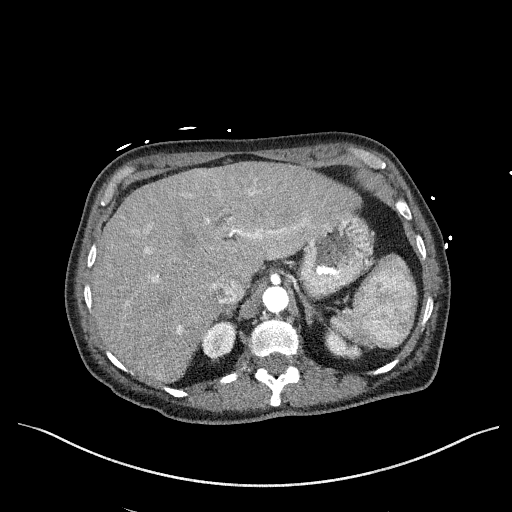
[im 153/215  soft-tissue]
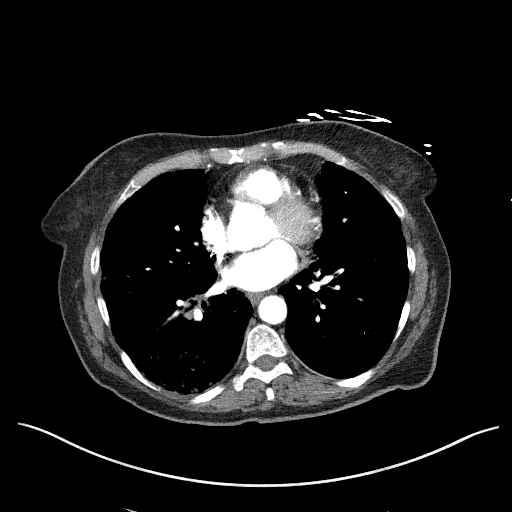
[im 169/215  soft-tissue]
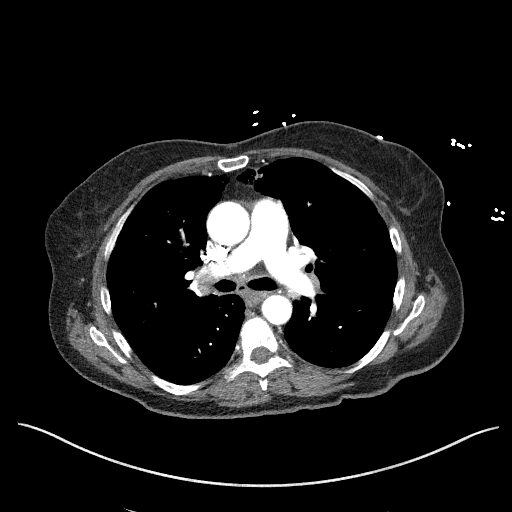
[im 199/215  soft-tissue]
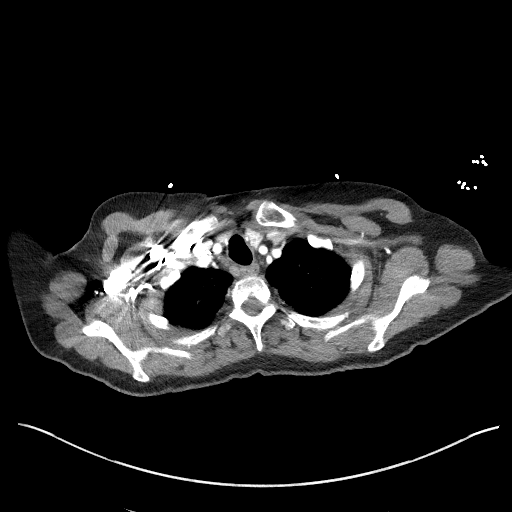
[im 199/215  bone]
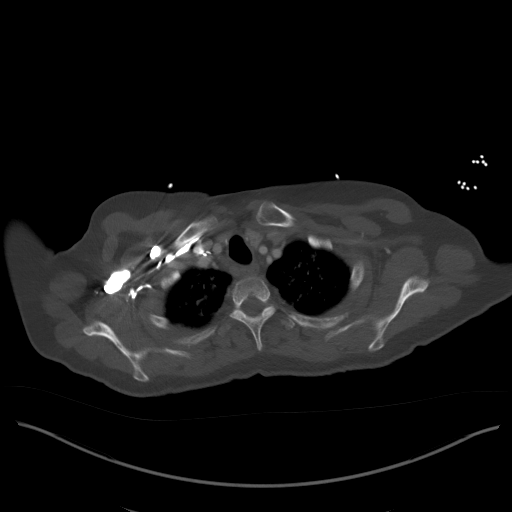

[Series 9: cor soft · coronal · 0.83mm/px · 3 of 135 slices shown]
[im 34/135  soft-tissue]
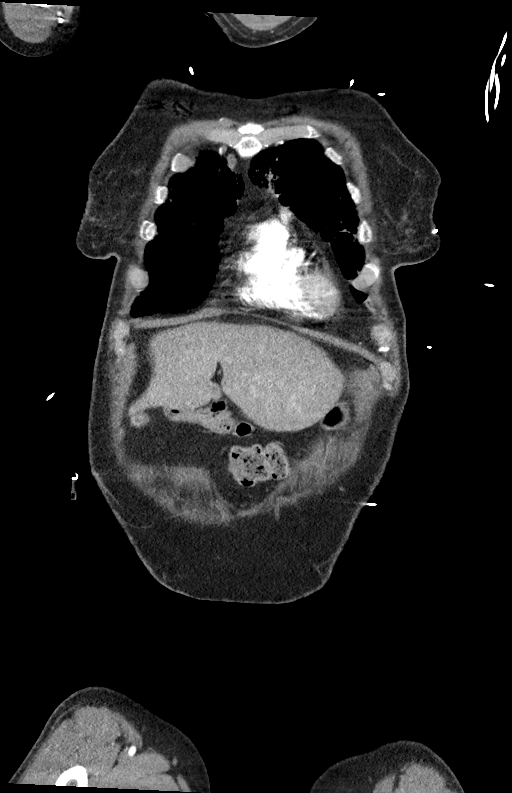
[im 68/135  soft-tissue]
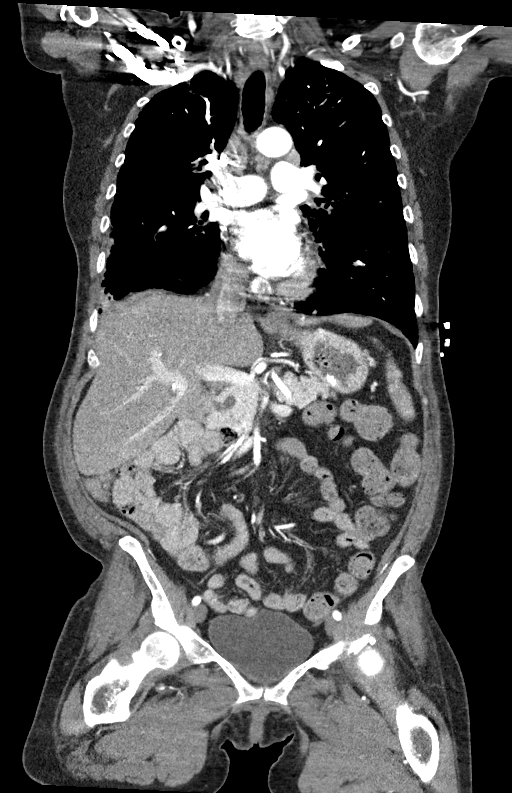
[im 101/135  soft-tissue]
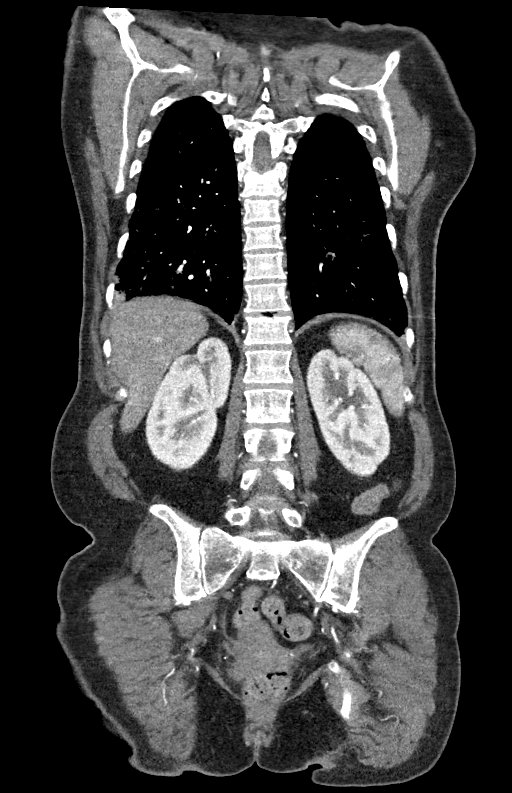

[12 of 46 positions shown; findings below may reference images not displayed]

Multidetector CT imaging through the chest, abdomen and pelvis was
performed using the standard protocol during bolus administration of
intravenous contrast. Multiplanar reconstructed images and MIPs were
obtained and reviewed to evaluate the vascular anatomy.

RADIATION DOSE REDUCTION: This exam was performed according to the
departmental dose-optimization program which includes automated
exposure control, adjustment of the mA and/or kV according to
patient size and/or use of iterative reconstruction technique.

CONTRAST:  100mL OMNIPAQUE IOHEXOL 350 MG/ML SOLN
FINDINGS: CTA CHEST FINDINGS

Cardiovascular: No evidence of thoracic aortic aneurysm or
dissection. Minimal atherosclerosis of the aortic arch at the origin
of the great vessels.

On not optimized for the opacification of the pulmonary vasculature,
there is adequate enhancement of the pulmonary vasculature. No
filling defects or pulmonary emboli. The heart is unremarkable
without pericardial effusion.

Mediastinum/Nodes: Stable borderline enlarged mediastinal and hilar
adenopathy. Largest lymph node in the precarinal region measures 13
mm in short axis, stable. Thyroid, trachea, and esophagus are
unremarkable.

Lungs/Pleura: Stable emphysema. There are stable bilateral areas of
bronchial wall thickening and tree in bud airspace disease, greatest
in the mid and lower lung zones. Findings are most consistent with
chronic inflammatory change or indolent infection such as GOEIMAN. No
new consolidation, effusion, or pneumothorax. Central airways are
patent.

Musculoskeletal: Since prior lumbar spine CT 03/30/2022, compression
deformity has developed within the superior endplate of the T12
vertebral body, with less than 10% loss of height. No retropulsion.
Since prior chest CT 08/17/2021, mild compression deformity of the
superior endplate of the T7 vertebral body has developed, with less
than 10% loss of height. No retropulsion.

No other acute bony abnormalities. Reconstructed images demonstrate
no additional findings.

Review of the MIP images confirms the above findings.

CTA ABDOMEN AND PELVIS FINDINGS

VASCULAR

Aorta: Normal caliber aorta without aneurysm, dissection, vasculitis
or significant stenosis.

Celiac: Patent without evidence of aneurysm, dissection, vasculitis
or significant stenosis.

SMA: Patent without evidence of aneurysm, dissection, vasculitis or
significant stenosis.

Renals: There are 2 right renal arteries with a small accessory
right renal artery supplying portions of the lower pole. The
bilateral renal arteries are patent without evidence of aneurysm,
dissection, vasculitis, fibromuscular dysplasia or significant
stenosis. Mild atherosclerosis at the origin of the left renal
artery.

IMA: Patent without evidence of aneurysm, dissection, vasculitis or
significant stenosis.

Inflow: Patent without evidence of aneurysm, dissection, vasculitis
or significant stenosis.

Veins: No obvious venous abnormality within the limitations of this
arterial phase study.

Review of the MIP images confirms the above findings.

NON-VASCULAR

Hepatobiliary: Liver is grossly unremarkable. No evidence of
cholelithiasis or cholecystitis. Chronic dilation of the common bile
duct, measuring 9 mm on today's exam. The intrahepatic duct dilation
seen on prior studies is not appreciated today

Pancreas: Unremarkable. No pancreatic ductal dilatation or
surrounding inflammatory changes.

Spleen: Normal in size without focal abnormality.

Adrenals/Urinary Tract: Adrenal glands are unremarkable. Kidneys are
normal, without renal calculi, focal lesion, or hydronephrosis.
Bladder is unremarkable.

Stomach/Bowel: No bowel obstruction or ileus. No bowel wall
thickening or inflammatory change.

Lymphatic: No pathologic adenopathy within the abdomen or pelvis.

Reproductive: Uterus and bilateral adnexa are unremarkable.

Other: No free fluid or free intraperitoneal gas. Small fat
containing supraumbilical hernia. No bowel herniation.

Musculoskeletal: Reconstructed images demonstrate no additional
findings. Reconstructed images demonstrate no additional findings.

Review of the MIP images confirms the above findings.
IMPRESSION: Vascular:

1. No evidence of thoracoabdominal aortic aneurysm or dissection.
2. No evidence of pulmonary embolus.
3.  Aortic Atherosclerosis (STLX8-IS8.8).

Nonvascular:

1. Chronic bilateral bronchial wall thickening and diffuse tree in
bud airspace disease, most consistent with chronic inflammatory
change or indolent atypical infection such as GOEIMAN. No change since
prior exam.
2. Stable mediastinal and hilar lymphadenopathy, likely reactive.
3. Acute to subacute compression fractures involving the superior
endplates at T7 and T12 as above. Less than 10% loss of height at
each location, with no retropulsion.
4. Chronic nonspecific dilation of the common bile duct.
5.  Emphysema (STLX8-MYE.F).

## 2023-05-25 IMAGING — DX DG CHEST 1V PORT
1 series · 1 of 1 positions shown · non-contrast
Comparison: Chest radiograph dated March 10, 2022

CLINICAL DATA: Pain above the left breast for 2 days. Denies
shortness of breath, fever.

EXAM:
PORTABLE CHEST 1 VIEW

[chest ap]
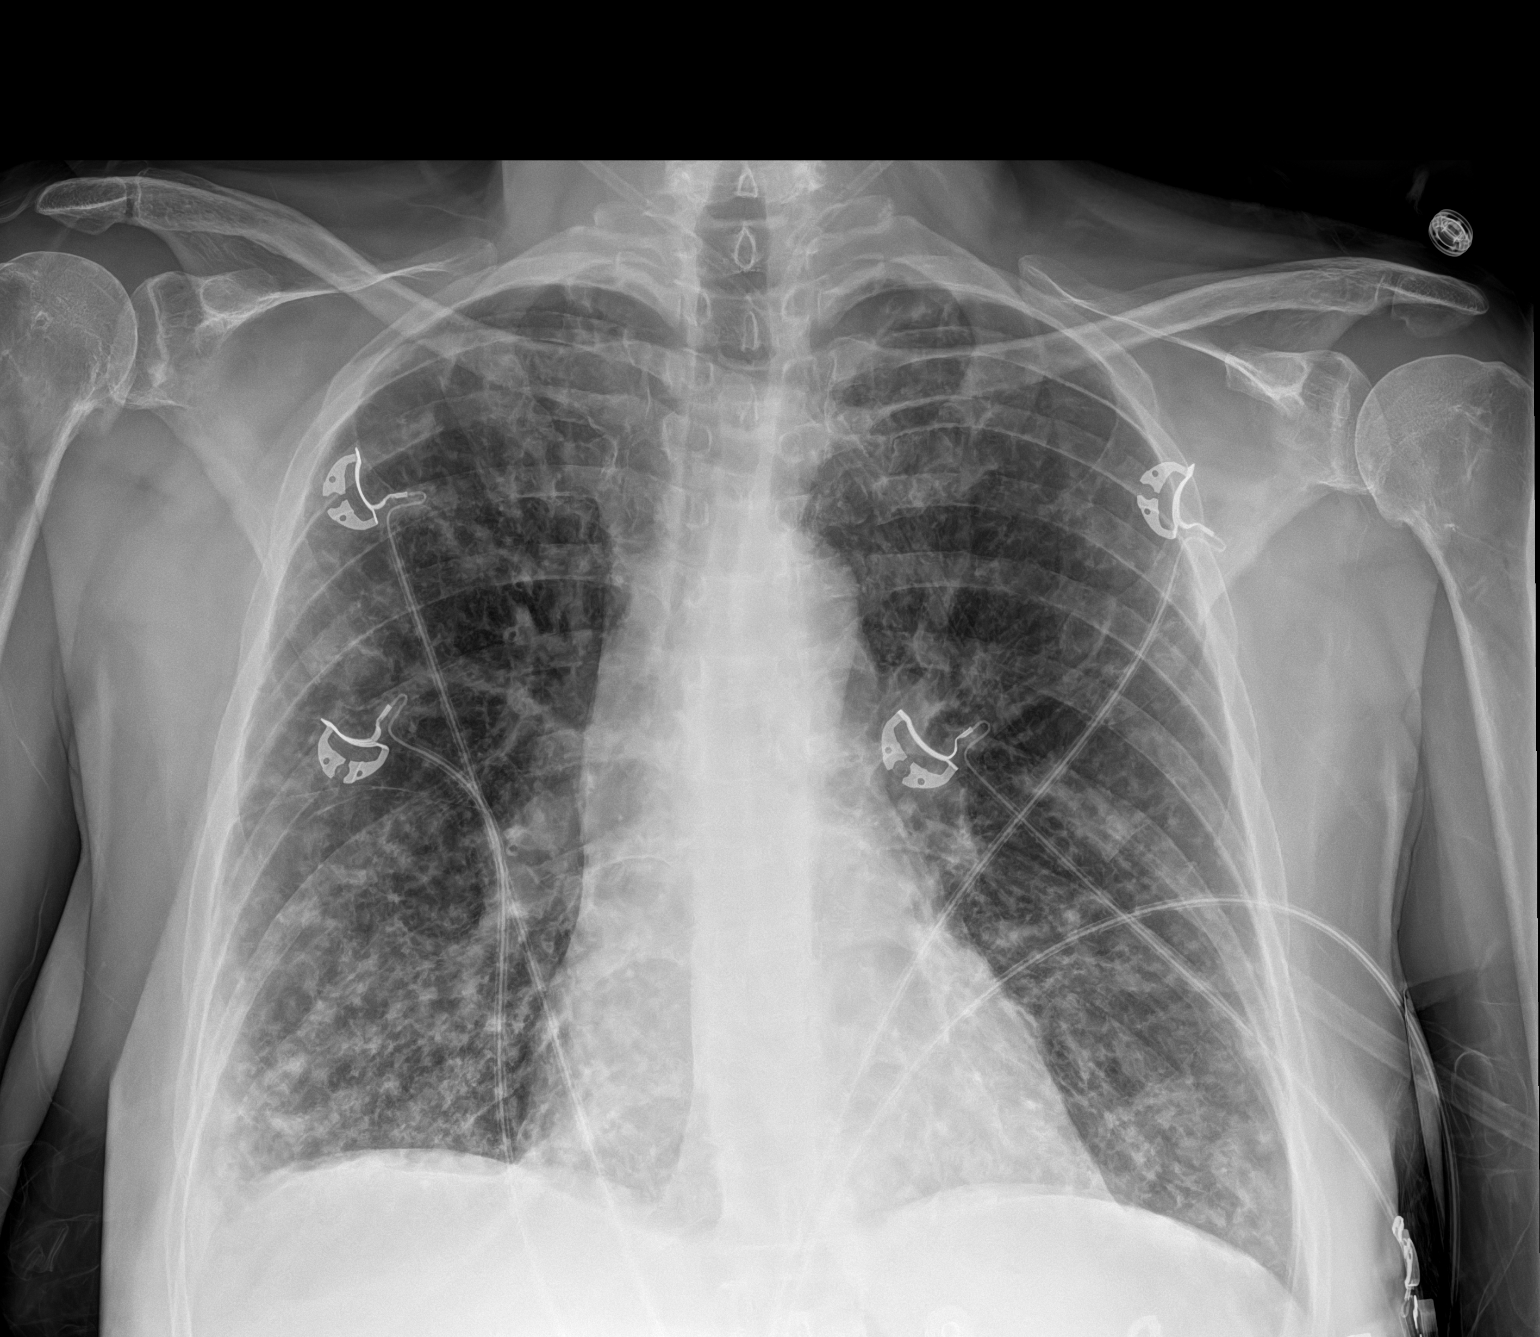

[1 of 1 positions shown; findings below may reference images not displayed]

FINDINGS: The heart size and mediastinal contours are within normal limits. No
significant interval change in bilateral diffuse interstitial and
airspace opacities suggesting chronic interstitial lung disease. No
pleural effusion or pneumothorax. No acute osseous abnormality.
IMPRESSION: 1. Prominent bilateral interstitial lung opacities concerning for
chronic interstitial lung disease. Acute infectious process in the
background chronic lung disease can not be excluded.

2.  No pleural effusion or pneumothorax.

## 2024-01-03 ENCOUNTER — Encounter (HOSPITAL_COMMUNITY): Payer: Self-pay

## 2024-01-03 ENCOUNTER — Emergency Department (HOSPITAL_COMMUNITY)
Admission: EM | Admit: 2024-01-03 | Discharge: 2024-01-04 | Disposition: A | Discharge status: Other Acute Inpatient Hospital | Payer: Medicaid Other | Attending: Emergency Medicine | Admitting: Emergency Medicine

## 2024-01-03 ENCOUNTER — Other Ambulatory Visit: Payer: Self-pay

## 2024-01-03 ENCOUNTER — Emergency Department (HOSPITAL_COMMUNITY): Payer: Medicaid Other

## 2024-01-03 DIAGNOSIS — Z7951 Long term (current) use of inhaled steroids: Secondary | ICD-10-CM | POA: Diagnosis not present

## 2024-01-03 DIAGNOSIS — D72829 Elevated white blood cell count, unspecified: Secondary | ICD-10-CM | POA: Diagnosis not present

## 2024-01-03 DIAGNOSIS — Z7984 Long term (current) use of oral hypoglycemic drugs: Secondary | ICD-10-CM | POA: Insufficient documentation

## 2024-01-03 DIAGNOSIS — K819 Cholecystitis, unspecified: Secondary | ICD-10-CM | POA: Diagnosis not present

## 2024-01-03 DIAGNOSIS — N390 Urinary tract infection, site not specified: Secondary | ICD-10-CM | POA: Diagnosis not present

## 2024-01-03 DIAGNOSIS — R300 Dysuria: Secondary | ICD-10-CM | POA: Diagnosis present

## 2024-01-03 DIAGNOSIS — K8050 Calculus of bile duct without cholangitis or cholecystitis without obstruction: Secondary | ICD-10-CM

## 2024-01-03 DIAGNOSIS — I10 Essential (primary) hypertension: Secondary | ICD-10-CM | POA: Diagnosis not present

## 2024-01-03 DIAGNOSIS — E1165 Type 2 diabetes mellitus with hyperglycemia: Secondary | ICD-10-CM | POA: Insufficient documentation

## 2024-01-03 DIAGNOSIS — R0602 Shortness of breath: Secondary | ICD-10-CM | POA: Diagnosis not present

## 2024-01-03 DIAGNOSIS — J449 Chronic obstructive pulmonary disease, unspecified: Secondary | ICD-10-CM | POA: Insufficient documentation

## 2024-01-03 DIAGNOSIS — K8042 Calculus of bile duct with acute cholecystitis without obstruction: Secondary | ICD-10-CM

## 2024-01-03 LAB — URINALYSIS, ROUTINE W REFLEX MICROSCOPIC
Bilirubin Urine: NEGATIVE
Glucose, UA: 50 mg/dL — AB
Hgb urine dipstick: NEGATIVE
Ketones, ur: NEGATIVE mg/dL
Leukocytes,Ua: NEGATIVE
Nitrite: POSITIVE — AB
Protein, ur: NEGATIVE mg/dL
Specific Gravity, Urine: 1.015 (ref 1.005–1.030)
pH: 6 (ref 5.0–8.0)

## 2024-01-03 LAB — CBC WITH DIFFERENTIAL/PLATELET
Abs Immature Granulocytes: 0.06 10*3/uL (ref 0.00–0.07)
Basophils Absolute: 0 10*3/uL (ref 0.0–0.1)
Basophils Relative: 0 %
Eosinophils Absolute: 0 10*3/uL (ref 0.0–0.5)
Eosinophils Relative: 0 %
HCT: 42.6 % (ref 36.0–46.0)
Hemoglobin: 13.8 g/dL (ref 12.0–15.0)
Immature Granulocytes: 0 %
Lymphocytes Relative: 9 %
Lymphs Abs: 1.5 10*3/uL (ref 0.7–4.0)
MCH: 27.2 pg (ref 26.0–34.0)
MCHC: 32.4 g/dL (ref 30.0–36.0)
MCV: 83.9 fL (ref 80.0–100.0)
Monocytes Absolute: 0.8 10*3/uL (ref 0.1–1.0)
Monocytes Relative: 5 %
Neutro Abs: 14 10*3/uL — ABNORMAL HIGH (ref 1.7–7.7)
Neutrophils Relative %: 86 %
Platelets: 356 10*3/uL (ref 150–400)
RBC: 5.08 MIL/uL (ref 3.87–5.11)
RDW: 12 % (ref 11.5–15.5)
WBC: 16.5 10*3/uL — ABNORMAL HIGH (ref 4.0–10.5)
nRBC: 0 % (ref 0.0–0.2)

## 2024-01-03 LAB — COMPREHENSIVE METABOLIC PANEL
ALT: 162 U/L — ABNORMAL HIGH (ref 0–44)
AST: 346 U/L — ABNORMAL HIGH (ref 15–41)
Albumin: 3.8 g/dL (ref 3.5–5.0)
Alkaline Phosphatase: 141 U/L — ABNORMAL HIGH (ref 38–126)
Anion gap: 12 (ref 5–15)
BUN: 9 mg/dL (ref 6–20)
CO2: 31 mmol/L (ref 22–32)
Calcium: 9.5 mg/dL (ref 8.9–10.3)
Chloride: 91 mmol/L — ABNORMAL LOW (ref 98–111)
Creatinine, Ser: 0.49 mg/dL (ref 0.44–1.00)
GFR, Estimated: >60 mL/min (ref >60)
Glucose, Bld: 312 mg/dL — ABNORMAL HIGH (ref 70–99)
Potassium: 4 mmol/L (ref 3.5–5.1)
Sodium: 134 mmol/L — ABNORMAL LOW (ref 135–145)
Total Bilirubin: 1.8 mg/dL — ABNORMAL HIGH (ref 0.0–1.2)
Total Protein: 7.8 g/dL (ref 6.5–8.1)

## 2024-01-03 LAB — MAGNESIUM: Magnesium: 1.6 mg/dL — ABNORMAL LOW (ref 1.7–2.4)

## 2024-01-03 LAB — LIPASE, BLOOD: Lipase: 35 U/L (ref 11–51)

## 2024-01-03 LAB — BRAIN NATRIURETIC PEPTIDE: B Natriuretic Peptide: 33 pg/mL (ref 0.0–100.0)

## 2024-01-03 MED ORDER — ONDANSETRON HCL 4 MG/2ML IJ SOLN
4.0000 mg | Freq: Four times a day (QID) | INTRAMUSCULAR | Status: DC | PRN
  Administered 2024-01-03 – 2024-01-04 (×2): 4 mg via INTRAVENOUS
  Filled 2024-01-03 (×2): qty 2

## 2024-01-03 MED ORDER — IPRATROPIUM-ALBUTEROL 0.5-2.5 (3) MG/3ML IN SOLN
3.0000 mL | Freq: Once | RESPIRATORY_TRACT | Status: AC
  Administered 2024-01-03: 3 mL via RESPIRATORY_TRACT
  Filled 2024-01-03: qty 3

## 2024-01-03 MED ORDER — ONDANSETRON 4 MG PO TBDP
4.0000 mg | ORAL_TABLET | Freq: Once | ORAL | Status: AC | PRN
  Administered 2024-01-03: 4 mg via ORAL
  Filled 2024-01-03: qty 1

## 2024-01-03 MED ORDER — LACTATED RINGERS IV BOLUS
500.0000 mL | Freq: Once | INTRAVENOUS | Status: AC
  Administered 2024-01-03: 500 mL via INTRAVENOUS

## 2024-01-03 MED ORDER — HYDROMORPHONE HCL 1 MG/ML IJ SOLN
1.0000 mg | Freq: Once | INTRAMUSCULAR | Status: AC
  Administered 2024-01-03: 1 mg via INTRAVENOUS
  Filled 2024-01-03: qty 1

## 2024-01-03 MED ORDER — SODIUM CHLORIDE 0.9 % IV SOLN
2.0000 g | Freq: Once | INTRAVENOUS | Status: AC
  Administered 2024-01-03: 2 g via INTRAVENOUS
  Filled 2024-01-03: qty 20

## 2024-01-03 MED ORDER — MAGNESIUM SULFATE 2 GM/50ML IV SOLN
2.0000 g | Freq: Once | INTRAVENOUS | Status: AC
  Administered 2024-01-03: 2 g via INTRAVENOUS
  Filled 2024-01-03: qty 50

## 2024-01-03 MED ORDER — IOHEXOL 300 MG/ML  SOLN
100.0000 mL | Freq: Once | INTRAMUSCULAR | Status: AC | PRN
  Administered 2024-01-03: 100 mL via INTRAVENOUS

## 2024-01-03 MED ORDER — METRONIDAZOLE 500 MG/100ML IV SOLN
500.0000 mg | Freq: Once | INTRAVENOUS | Status: AC
  Administered 2024-01-03: 500 mg via INTRAVENOUS
  Filled 2024-01-03: qty 100

## 2024-01-03 MED ORDER — HYDROMORPHONE HCL 1 MG/ML IJ SOLN
0.5000 mg | Freq: Once | INTRAMUSCULAR | Status: AC
  Administered 2024-01-03: 0.5 mg via INTRAVENOUS
  Filled 2024-01-03: qty 0.5

## 2024-01-03 NOTE — ED Triage Notes (Signed)
 Pt arrived via [facility] from home c/o generalized abdominal pain that radiates to her back. Pt reports Hx of gallstones. Pt presents on 3L Nasal Cannula at baseline. Pt reports last taking 1000mg  Tylenol  apprx 2 hrs PTA w/o relief.

## 2024-01-03 NOTE — ED Notes (Signed)
 Patient transported to CT

## 2024-01-03 NOTE — ED Notes (Signed)
 Ok'd to have water per Dr. Kermit Ped.

## 2024-01-03 NOTE — ED Provider Notes (Signed)
[facility] Provider Note   CSN: [id number] Arrival date & time: 01/03/24  1610     History  Chief Complaint  Patient presents with   Abdominal Pain    [name] is a 58 y.o. female.   Abdominal Pain Associated symptoms: dysuria, nausea, shortness of breath and vomiting   Patient presents for abdominal pain.  Medical history includes DM, HLD, COPD, GERD, HTN, anxiety.  Over the several days, patient has had lower back pain, greater on the left.  She has had dysuria and orange-colored urine.  This morning at 730, patient had onset of upper abdominal pain.  She had nausea and vomiting at 8:00 this morning.  She has had persistent pain throughout the day.  When she tried to eat some crackers, pain worsened.  She has been told that she has gallstones in the past.  Current pain is 10/10 in severity.  She has had intermittent nausea throughout the day but denies any currently.  Last bowel movement was this morning.  She is on supplemental oxygen throughout the day at baseline.     Home Medications Prior to Admission medications   Medication Sig Start Date End Date Taking? Authorizing Provider  acetaminophen (TYLENOL) 500 MG tablet Take 500 mg by mouth every 6 (six) hours as needed for fever or mild pain.   Yes [provider]  albuterol (PROVENTIL) (2.5 MG/3ML) 0.083% nebulizer solution Take 3 mLs (2.5 mg total) by nebulization every 6 (six) hours as needed for wheezing or shortness of breath. 03/13/22  Yes Emokpae, Courage, MD  albuterol (VENTOLIN HFA) 108 (90 Base) MCG/ACT inhaler Inhale 2 puffs into the lungs every 4 (four) hours as needed for wheezing or shortness of breath. 03/13/22  Yes Emokpae, Courage, MD  glipiZIDE (GLUCOTROL) 5 MG tablet Take 2 tablets (10 mg total) by mouth daily before breakfast. 04/23/23  Yes Tat, Onalee Hua, MD  metFORMIN (GLUCOPHAGE) 500 MG tablet Take 1 tablet (500 mg total) by mouth 2 (two) times daily with a  meal. 04/23/23  Yes Tat, Onalee Hua, MD  methocarbamol (ROBAXIN) 500 MG tablet Take 500 mg by mouth every 8 (eight) hours as needed for muscle spasms. 08/13/21  Yes [provider]  ondansetron (ZOFRAN-ODT) 4 MG disintegrating tablet 4mg  ODT q4 hours prn nausea/vomit Patient taking differently: Take 4 mg by mouth every 4 (four) hours as needed for nausea or vomiting. 4mg  ODT q4 hours prn nausea/vomit 04/19/22  Yes Bethann Berkshire, MD  OZEMPIC, 0.25 OR 0.5 MG/DOSE, 2 MG/3ML SOPN Inject 0.5 mg into the skin once a week. Tuesdays. 12/28/23  Yes [provider]  pantoprazole (PROTONIX) 40 MG tablet Take 1 tablet (40 mg total) by mouth daily. 03/13/22  Yes Emokpae, Courage, MD  rOPINIRole (REQUIP) 1 MG tablet Take 1 mg by mouth at bedtime. 03/09/23  Yes [provider]  rosuvastatin (CRESTOR) 40 MG tablet Take 40 mg by mouth daily. 08/17/23  Yes [provider]  SUMAtriptan (IMITREX) 50 MG tablet Take 50 mg by mouth as needed for migraine or headache. 11/29/21  Yes [provider]  SYMBICORT 80-4.5 MCG/ACT inhaler Inhale 2 puffs into the lungs 2 (two) times daily. 12/28/23  Yes [provider]  ARIPiprazole (ABILIFY) 5 MG tablet Take 5 mg by mouth daily.    02/03/12  [provider]  venlafaxine (EFFEXOR) 75 MG tablet Take 75 mg by mouth daily.    02/03/12  [provider]  Allergies    Levofloxacin, Levofloxacin in d5w, Penicillins, Doxycycline, and Morphine    Review of Systems   Review of Systems  Respiratory:  Positive for shortness of breath.   Gastrointestinal:  Positive for abdominal pain, nausea and vomiting.  Genitourinary:  Positive for dysuria.  Musculoskeletal:  Positive for back pain.  All other systems reviewed and are negative.   Physical Exam Updated Vital Signs BP 133/85   Pulse (!) 111   Temp 98.5 F (36.9 C) (Oral)   Resp (!) 27   Ht 5\' 1"  (1.549 m)   Wt 71 kg   SpO2 93%   BMI 29.58 kg/m  Physical Exam Vitals  and nursing note reviewed.  Constitutional:      General: She is not in acute distress.    Appearance: She is well-developed. She is not ill-appearing, toxic-appearing or diaphoretic.  HENT:     Head: Normocephalic and atraumatic.     Mouth/Throat:     Mouth: Mucous membranes are moist.  Eyes:     Conjunctiva/sclera: Conjunctivae normal.  Cardiovascular:     Rate and Rhythm: Regular rhythm. Tachycardia present.     Heart sounds: No murmur heard. Pulmonary:     Effort: Pulmonary effort is normal. No respiratory distress.     Breath sounds: Wheezing present.  Abdominal:     Palpations: Abdomen is soft.     Tenderness: There is abdominal tenderness in the right upper quadrant and epigastric area. There is left CVA tenderness. There is no right CVA tenderness, guarding or rebound.  Musculoskeletal:        General: No swelling.     Cervical back: Neck supple.  Skin:    General: Skin is warm and dry.     Coloration: Skin is not cyanotic, jaundiced or pale.  Neurological:     General: No focal deficit present.     Mental Status: She is alert and oriented to person, place, and time.  Psychiatric:        Mood and Affect: Mood normal.        Behavior: Behavior normal.     ED Results / Procedures / Treatments   Labs (all labs ordered are listed, but only abnormal results are displayed) Labs Reviewed  COMPREHENSIVE METABOLIC PANEL - Abnormal; Notable for the following components:      Result Value   Sodium 134 (*)    Chloride 91 (*)    Glucose, Bld 312 (*)    AST 346 (*)    ALT 162 (*)    Alkaline Phosphatase 141 (*)    Total Bilirubin 1.8 (*)    All other components within normal limits  URINALYSIS, ROUTINE W REFLEX MICROSCOPIC - Abnormal; Notable for the following components:   Color, Urine AMBER (*)    Glucose, UA 50 (*)    Nitrite POSITIVE (*)    Bacteria, UA MANY (*)    All other components within normal limits  CBC WITH DIFFERENTIAL/PLATELET - Abnormal; Notable for the  following components:   WBC 16.5 (*)    Neutro Abs 14.0 (*)    All other components within normal limits  MAGNESIUM - Abnormal; Notable for the following components:   Magnesium 1.6 (*)    All other components within normal limits  LIPASE, BLOOD  BRAIN NATRIURETIC PEPTIDE    EKG EKG Interpretation Date/Time:  Monday January 03 2024 16:21:56 EST Ventricular Rate:  108 PR Interval:  168 QRS Duration:  116 QT Interval:  330 QTC Calculation: 442  R Axis:   66  Text Interpretation: Sinus tachycardia Right atrial enlargement Incomplete right bundle branch block Possible Right ventricular hypertrophy Confirmed by Gloris Manchester [phone]) on 01/03/2024 7:38:38 PM  Radiology CT ABDOMEN PELVIS W CONTRAST Result Date: 01/03/2024 CLINICAL DATA:  Generalized abdominal pain radiating to the back. History of gallstones. EXAM: CT ABDOMEN AND PELVIS WITH CONTRAST TECHNIQUE: Multidetector CT imaging of the abdomen and pelvis was performed using the standard protocol following bolus administration of intravenous contrast. RADIATION DOSE REDUCTION: This exam was performed according to the departmental dose-optimization program which includes automated exposure control, adjustment of the mA and/or kV according to patient size and/or use of iterative reconstruction technique. CONTRAST:  OMNIPAQUE IOHEXOL 300 MG/ML  SOLN COMPARISON:  08/17/2021 FINDINGS: Lower chest: Redemonstration of innumerable tree in bud opacities at the lung bases, right more than left, consistent with chronic inflammatory disease, possibly atypical pneumonia. No dense consolidation or lobar collapse. No effusion. Hepatobiliary: Liver parenchyma is normal. The gallbladder is distended and there is surrounding edema. The patient is known to have gallstones but these are not calcified and not visible by CT. There is intra and extrahepatic ductal dilatation, presumably indicating choledocholithiasis. I do not see a pancreatic head mass. Pancreas:  No pancreatic head mass identified. Presumed choledocholithiasis. No sign of pancreatitis. Spleen: Normal Adrenals/Urinary Tract: Adrenal glands are normal. No significant renal finding. Few small cysts. Normal bladder. Stomach/Bowel: Stomach and small intestine are normal. Normal appendix. Normal colon. Vascular/Lymphatic: Aorta and IVC are normal.  No adenopathy. Reproductive: No pelvic mass. Other: No free fluid or air. Musculoskeletal: Ordinary lumbar degenerative changes. Old minor compression deformity T12. IMPRESSION: 1. Distended gallbladder with surrounding edema. The patient is known to have gallstones but these are not calcified and not visible by CT. Findings quite worrisome for acute cholecystitis and obstruction. There is intra and extrahepatic ductal dilatation, presumably indicating choledocholithiasis responsible for biliary ductal obstruction. 2. Redemonstration of innumerable tree in bud opacities at the lung bases, right more than left, consistent with chronic inflammatory disease, possibly atypical pneumonia. Electronically Signed   By: Paulina Fusi M.D.   On: 01/03/2024 21:30    Procedures Procedures    Medications Ordered in ED Medications  ondansetron (ZOFRAN) injection 4 mg (4 mg Intravenous Given 01/03/24 1957)  ondansetron (ZOFRAN-ODT) disintegrating tablet 4 mg (4 mg Oral Given 01/03/24 1624)  HYDROmorphone (DILAUDID) injection 1 mg (1 mg Intravenous Given 01/03/24 1957)  cefTRIAXone (ROCEPHIN) 2 g in sodium chloride 0.9 % 100 mL IVPB (0 g Intravenous Stopped 01/03/24 2102)  ipratropium-albuterol (DUONEB) 0.5-2.5 (3) MG/3ML nebulizer solution 3 mL (3 mLs Nebulization Given 01/03/24 1955)  lactated ringers bolus 500 mL (0 mLs Intravenous Stopped 01/03/24 2102)  iohexol (OMNIPAQUE) 300 MG/ML solution 100 mL (100 mLs Intravenous Contrast Given 01/03/24 2013)  magnesium sulfate IVPB 2 g 50 mL (0 g Intravenous Stopped 01/03/24 2145)  metroNIDAZOLE (FLAGYL) IVPB 500 mg (0 mg  Intravenous Stopped 01/03/24 2201)  lactated ringers bolus 500 mL (0 mLs Intravenous Stopped 01/03/24 2207)  HYDROmorphone (DILAUDID) injection 0.5 mg (0.5 mg Intravenous Given 01/03/24 2350)    ED Course/ Medical Decision Making/ A&P                                 Medical Decision Making Amount and/or Complexity of Data Reviewed Labs: ordered. Radiology: ordered.  Risk Prescription drug management.   This patient presents to the ED for concern of abdominal  pain, this involves an extensive number of treatment options, and is a complaint that carries with it a high risk of complications and morbidity.  The differential diagnosis includes cholecystitis, symptomatic cholelithiasis, PUD, UTI, other intra-abdominal infection, pancreatitis   Co morbidities that complicate the patient evaluation  DM, HLD, COPD, GERD, HTN, anxiety   Additional history obtained:  Additional history obtained from N/A External records from outside source obtained and reviewed including EMR   Lab Tests:  I Ordered, and personally interpreted labs.  The pertinent results include: Leukocytosis is present.  Hyperglycemia is present without evidence of DKA.  Hypomagnesemia is present with otherwise normal electrolytes.  There is a new elevation in hepatobiliary enzymes.  Lipase is normal.  Urinalysis shows evidence of infection.   Imaging Studies ordered:  I ordered imaging studies including CT of abdomen and pelvis, MRCP I independently visualized and interpreted imaging which showed CT scan showed findings consistent with cholecystitis.  There was also intra and extrahepatic ductal dilatation concerning for choledocholithiasis.  MRCP was pending at time of signout. I agree with the radiologist interpretation   Cardiac Monitoring: / EKG:  The patient was maintained on a cardiac monitor.  I personally viewed and interpreted the cardiac monitored which showed an underlying rhythm of: Sinus  rhythm   Consultations Obtained:  I requested consultation with the general surgeon, Dr. Lovell Sheehan,  and discussed lab and imaging findings as well as pertinent plan - they recommend: Clear liquids for now.  MRCP to rule out choledocholithiasis.  Dr. Lovell Sheehan will see in the morning.   Problem List / ED Course / Critical interventions / Medication management  Patient presents for abdominal pain.  Workup, initiated prior to patient being bedded in the ED is notable for evidence of UTI, leukocytosis, and elevations in hepatobiliary enzymes.  On exam, patient appears uncomfortable.  She does endorse some recent dysuria and back pain over the past several days.  She does have left CVA tenderness.  Presentation consistent with pyelonephritis.  Ceftriaxone was ordered.  Dilaudid was ordered for analgesia.  In terms of her upper abdominal pain, onset was this morning.  She has right upper quadrant tenderness concerning for gallbladder etiology.  This pain has worsened with p.o. intake today.  She has a nausea and vomiting, however, nausea is currently resolved.  CT scan was ordered to further evaluate.  Patient attributes her current shortness of breath to her pain.  On auscultation, patient has diffuse wheezing.  She is on supplemental oxygen throughout the day and this to schedule breathing treatments.  DuoNeb was ordered.  On CT scan, patient does have findings consistent with cholecystitis.  There is also concern of choledocholithiasis given biliary ductal dilatation.  MRCP was ordered.  General surgery was made aware.  They are okay with clear liquids for now.  On reassessment, patient's pain is improved.  She does request additional pain medication.  This was ordered.  MRCP was pending at time of signout.  Care of patient was signed out to oncoming ED provider. I ordered medication including IV fluids for hydration; Dilaudid for analgesia; Zofran for nausea; DuoNeb for shortness of breath and wheezing,  magnesium sulfate for hypomagnesemia, ceftriaxone and Flagyl for cholecystitis and UTI Reevaluation of the patient after these medicines showed that the patient improved I have reviewed the patients home medicines and have made adjustments as needed   Social Determinants of Health:  Lives independently         Final Clinical Impression(s) / ED Diagnoses  Final diagnoses:  Urinary tract infection without hematuria, site unspecified  Cholecystitis    Rx / DC Orders ED Discharge Orders     None         Gloris Manchester, MD 01/04/24 [phone]

## 2024-01-03 NOTE — ED Notes (Signed)
 Pt back from CT

## 2024-01-04 ENCOUNTER — Inpatient Hospital Stay: Payer: Medicaid Other | Admitting: Anesthesiology

## 2024-01-04 ENCOUNTER — Encounter: Admission: EM | Disposition: A | Discharge status: Home / Self Care | Payer: Self-pay | Attending: Internal Medicine

## 2024-01-04 ENCOUNTER — Other Ambulatory Visit: Payer: Self-pay

## 2024-01-04 ENCOUNTER — Inpatient Hospital Stay
Admission: EM | Admit: 2024-01-04 | Discharge: 2024-01-07 | DRG: 418 | Disposition: A | Discharge status: Home / Self Care | Payer: Medicaid Other | Source: Other Acute Inpatient Hospital | Attending: Internal Medicine | Admitting: Internal Medicine

## 2024-01-04 ENCOUNTER — Emergency Department (HOSPITAL_COMMUNITY): Payer: Medicaid Other

## 2024-01-04 ENCOUNTER — Inpatient Hospital Stay: Payer: Medicaid Other

## 2024-01-04 DIAGNOSIS — R109 Unspecified abdominal pain: Secondary | ICD-10-CM | POA: Diagnosis not present

## 2024-01-04 DIAGNOSIS — K8050 Calculus of bile duct without cholangitis or cholecystitis without obstruction: Secondary | ICD-10-CM | POA: Diagnosis not present

## 2024-01-04 DIAGNOSIS — Z888 Allergy status to other drugs, medicaments and biological substances status: Secondary | ICD-10-CM

## 2024-01-04 DIAGNOSIS — K819 Cholecystitis, unspecified: Secondary | ICD-10-CM

## 2024-01-04 DIAGNOSIS — R1011 Right upper quadrant pain: Secondary | ICD-10-CM | POA: Diagnosis not present

## 2024-01-04 DIAGNOSIS — Z885 Allergy status to narcotic agent status: Secondary | ICD-10-CM

## 2024-01-04 DIAGNOSIS — R0602 Shortness of breath: Secondary | ICD-10-CM | POA: Diagnosis not present

## 2024-01-04 DIAGNOSIS — K8042 Calculus of bile duct with acute cholecystitis without obstruction: Principal | ICD-10-CM | POA: Diagnosis present

## 2024-01-04 DIAGNOSIS — Z7951 Long term (current) use of inhaled steroids: Secondary | ICD-10-CM

## 2024-01-04 DIAGNOSIS — Z833 Family history of diabetes mellitus: Secondary | ICD-10-CM | POA: Diagnosis not present

## 2024-01-04 DIAGNOSIS — D72829 Elevated white blood cell count, unspecified: Secondary | ICD-10-CM | POA: Diagnosis not present

## 2024-01-04 DIAGNOSIS — Z7984 Long term (current) use of oral hypoglycemic drugs: Secondary | ICD-10-CM

## 2024-01-04 DIAGNOSIS — N10 Acute tubulo-interstitial nephritis: Secondary | ICD-10-CM | POA: Insufficient documentation

## 2024-01-04 DIAGNOSIS — Z881 Allergy status to other antibiotic agents status: Secondary | ICD-10-CM | POA: Diagnosis not present

## 2024-01-04 DIAGNOSIS — Z87892 Personal history of anaphylaxis: Secondary | ICD-10-CM

## 2024-01-04 DIAGNOSIS — F17210 Nicotine dependence, cigarettes, uncomplicated: Secondary | ICD-10-CM | POA: Diagnosis present

## 2024-01-04 DIAGNOSIS — I10 Essential (primary) hypertension: Secondary | ICD-10-CM | POA: Diagnosis present

## 2024-01-04 DIAGNOSIS — E1165 Type 2 diabetes mellitus with hyperglycemia: Secondary | ICD-10-CM | POA: Diagnosis present

## 2024-01-04 DIAGNOSIS — I2510 Atherosclerotic heart disease of native coronary artery without angina pectoris: Secondary | ICD-10-CM | POA: Diagnosis present

## 2024-01-04 DIAGNOSIS — E785 Hyperlipidemia, unspecified: Secondary | ICD-10-CM | POA: Diagnosis present

## 2024-01-04 DIAGNOSIS — Z9981 Dependence on supplemental oxygen: Secondary | ICD-10-CM

## 2024-01-04 DIAGNOSIS — Z7985 Long-term (current) use of injectable non-insulin antidiabetic drugs: Secondary | ICD-10-CM | POA: Diagnosis not present

## 2024-01-04 DIAGNOSIS — K8062 Calculus of gallbladder and bile duct with acute cholecystitis without obstruction: Secondary | ICD-10-CM

## 2024-01-04 DIAGNOSIS — Z79899 Other long term (current) drug therapy: Secondary | ICD-10-CM | POA: Diagnosis not present

## 2024-01-04 DIAGNOSIS — K219 Gastro-esophageal reflux disease without esophagitis: Secondary | ICD-10-CM | POA: Diagnosis present

## 2024-01-04 DIAGNOSIS — N390 Urinary tract infection, site not specified: Secondary | ICD-10-CM | POA: Diagnosis not present

## 2024-01-04 DIAGNOSIS — J449 Chronic obstructive pulmonary disease, unspecified: Secondary | ICD-10-CM | POA: Diagnosis not present

## 2024-01-04 DIAGNOSIS — R300 Dysuria: Secondary | ICD-10-CM | POA: Diagnosis present

## 2024-01-04 DIAGNOSIS — Z880 Allergy status to penicillin: Secondary | ICD-10-CM

## 2024-01-04 DIAGNOSIS — Z8249 Family history of ischemic heart disease and other diseases of the circulatory system: Secondary | ICD-10-CM | POA: Diagnosis not present

## 2024-01-04 DIAGNOSIS — R7989 Other specified abnormal findings of blood chemistry: Secondary | ICD-10-CM | POA: Diagnosis present

## 2024-01-04 DIAGNOSIS — J441 Chronic obstructive pulmonary disease with (acute) exacerbation: Secondary | ICD-10-CM | POA: Diagnosis present

## 2024-01-04 DIAGNOSIS — K8043 Calculus of bile duct with acute cholecystitis with obstruction: Secondary | ICD-10-CM | POA: Diagnosis present

## 2024-01-04 HISTORY — PX: BILIARY DILATION: SHX6850

## 2024-01-04 HISTORY — PX: ENDOSCOPIC RETROGRADE CHOLANGIOPANCREATOGRAPHY (ERCP) WITH PROPOFOL: SHX5810

## 2024-01-04 HISTORY — PX: REMOVAL OF STONES: SHX5545

## 2024-01-04 HISTORY — DX: Calculus of gallbladder without cholecystitis with obstruction: K80.21

## 2024-01-04 LAB — COMPREHENSIVE METABOLIC PANEL
ALT: 304 U/L — ABNORMAL HIGH (ref 0–44)
AST: 378 U/L — ABNORMAL HIGH (ref 15–41)
Albumin: 3.1 g/dL — ABNORMAL LOW (ref 3.5–5.0)
Alkaline Phosphatase: 176 U/L — ABNORMAL HIGH (ref 38–126)
Anion gap: 12 (ref 5–15)
BUN: 9 mg/dL (ref 6–20)
CO2: 29 mmol/L (ref 22–32)
Calcium: 8.2 mg/dL — ABNORMAL LOW (ref 8.9–10.3)
Chloride: 90 mmol/L — ABNORMAL LOW (ref 98–111)
Creatinine, Ser: 0.46 mg/dL (ref 0.44–1.00)
GFR, Estimated: >60 mL/min (ref >60)
Glucose, Bld: 228 mg/dL — ABNORMAL HIGH (ref 70–99)
Potassium: 3.6 mmol/L (ref 3.5–5.1)
Sodium: 131 mmol/L — ABNORMAL LOW (ref 135–145)
Total Bilirubin: 3.3 mg/dL — ABNORMAL HIGH (ref 0.0–1.2)
Total Protein: 6.8 g/dL (ref 6.5–8.1)

## 2024-01-04 LAB — HEMOGLOBIN A1C
Hgb A1c MFr Bld: 10.9 % — ABNORMAL HIGH (ref 4.8–5.6)
Mean Plasma Glucose: 266.13 mg/dL

## 2024-01-04 LAB — GLUCOSE, CAPILLARY
Glucose-Capillary: 167 mg/dL — ABNORMAL HIGH (ref 70–99)
Glucose-Capillary: 229 mg/dL — ABNORMAL HIGH (ref 70–99)

## 2024-01-04 LAB — CBC
HCT: 37.4 % (ref 36.0–46.0)
Hemoglobin: 12.2 g/dL (ref 12.0–15.0)
MCH: 26.9 pg (ref 26.0–34.0)
MCHC: 32.6 g/dL (ref 30.0–36.0)
MCV: 82.4 fL (ref 80.0–100.0)
Platelets: 301 10*3/uL (ref 150–400)
RBC: 4.54 MIL/uL (ref 3.87–5.11)
RDW: 12.2 % (ref 11.5–15.5)
WBC: 11.1 10*3/uL — ABNORMAL HIGH (ref 4.0–10.5)
nRBC: 0 % (ref 0.0–0.2)

## 2024-01-04 LAB — LACTIC ACID, PLASMA: Lactic Acid, Venous: 0.7 mmol/L (ref 0.5–1.9)

## 2024-01-04 LAB — CBG MONITORING, ED: Glucose-Capillary: 185 mg/dL — ABNORMAL HIGH (ref 70–99)

## 2024-01-04 SURGERY — ENDOSCOPIC RETROGRADE CHOLANGIOPANCREATOGRAPHY (ERCP) WITH PROPOFOL
Anesthesia: General

## 2024-01-04 MED ORDER — ACETAMINOPHEN 500 MG PO TABS: 500.0000 mg | ORAL_TABLET | Freq: Four times a day (QID) | ORAL | Status: DC | PRN

## 2024-01-04 MED ORDER — SODIUM CHLORIDE 0.9 % IV SOLN: 2.0000 g | Freq: Three times a day (TID) | INTRAVENOUS | Status: DC

## 2024-01-04 MED ORDER — SODIUM CHLORIDE 0.9 % IV SOLN: INTRAVENOUS | Status: DC | PRN

## 2024-01-04 MED ORDER — SIMETHICONE 80 MG PO CHEW: 80.0000 mg | CHEWABLE_TABLET | Freq: Once | ORAL | Status: DC

## 2024-01-04 MED ORDER — METRONIDAZOLE 500 MG/100ML IV SOLN
500.0000 mg | Freq: Two times a day (BID) | INTRAVENOUS | Status: DC
  Administered 2024-01-04 – 2024-01-07 (×4): 500 mg via INTRAVENOUS
  Filled 2024-01-04 (×6): qty 100

## 2024-01-04 MED ORDER — HYDROMORPHONE HCL 1 MG/ML IJ SOLN
1.0000 mg | INTRAMUSCULAR | Status: DC | PRN
  Administered 2024-01-04 – 2024-01-07 (×16): 1 mg via INTRAVENOUS
  Filled 2024-01-04 (×16): qty 1

## 2024-01-04 MED ORDER — METHOCARBAMOL 500 MG PO TABS
500.0000 mg | ORAL_TABLET | Freq: Three times a day (TID) | ORAL | Status: DC | PRN
  Administered 2024-01-04: 500 mg via ORAL
  Filled 2024-01-04: qty 1

## 2024-01-04 MED ORDER — HYDROMORPHONE HCL 1 MG/ML IJ SOLN: 1.0000 mg | Freq: Once | INTRAMUSCULAR | Status: DC

## 2024-01-04 MED ORDER — DEXMEDETOMIDINE HCL IN NACL 80 MCG/20ML IV SOLN
INTRAVENOUS | Status: DC | PRN
  Administered 2024-01-04 (×2): 20 ug via INTRAVENOUS

## 2024-01-04 MED ORDER — HYDROMORPHONE HCL 1 MG/ML IJ SOLN
1.0000 mg | Freq: Once | INTRAMUSCULAR | Status: AC
  Administered 2024-01-04: 1 mg via INTRAVENOUS
  Filled 2024-01-04: qty 1

## 2024-01-04 MED ORDER — SODIUM CHLORIDE 0.9 % IV SOLN: INTRAVENOUS | Status: AC

## 2024-01-04 MED ORDER — PROPOFOL 10 MG/ML IV BOLUS
INTRAVENOUS | Status: DC | PRN
  Administered 2024-01-04 (×2): 50 mg via INTRAVENOUS

## 2024-01-04 MED ORDER — LACTATED RINGERS IV SOLN
INTRAVENOUS | Status: DC | PRN
Start: 2024-01-04 — End: 2024-01-04

## 2024-01-04 MED ORDER — IPRATROPIUM-ALBUTEROL 0.5-2.5 (3) MG/3ML IN SOLN
RESPIRATORY_TRACT | Status: AC
  Filled 2024-01-04: qty 3

## 2024-01-04 MED ORDER — LIDOCAINE HCL (CARDIAC) PF 100 MG/5ML IV SOSY
PREFILLED_SYRINGE | INTRAVENOUS | Status: DC | PRN
  Administered 2024-01-04: 60 mg via INTRAVENOUS

## 2024-01-04 MED ORDER — SMOG ENEMA: 960.0000 mL | Freq: Once | RECTAL | Status: DC

## 2024-01-04 MED ORDER — PANTOPRAZOLE SODIUM 40 MG PO TBEC
40.0000 mg | DELAYED_RELEASE_TABLET | Freq: Every day | ORAL | Status: DC
Start: 2024-01-04 — End: 2024-01-07
  Administered 2024-01-04 – 2024-01-07 (×3): 40 mg via ORAL
  Filled 2024-01-04 (×3): qty 1

## 2024-01-04 MED ORDER — IPRATROPIUM-ALBUTEROL 0.5-2.5 (3) MG/3ML IN SOLN
3.0000 mL | Freq: Four times a day (QID) | RESPIRATORY_TRACT | Status: DC
  Administered 2024-01-05 – 2024-01-07 (×8): 3 mL via RESPIRATORY_TRACT
  Filled 2024-01-04 (×11): qty 3

## 2024-01-04 MED ORDER — PHENYLEPHRINE 80 MCG/ML (10ML) SYRINGE FOR IV PUSH (FOR BLOOD PRESSURE SUPPORT)
PREFILLED_SYRINGE | INTRAVENOUS | Status: AC
  Filled 2024-01-04: qty 10

## 2024-01-04 MED ORDER — ONDANSETRON HCL 4 MG/2ML IJ SOLN
4.0000 mg | Freq: Once | INTRAMUSCULAR | Status: AC
  Administered 2024-01-04: 4 mg via INTRAVENOUS
  Filled 2024-01-04: qty 2

## 2024-01-04 MED ORDER — LIDOCAINE HCL (PF) 2 % IJ SOLN
INTRAMUSCULAR | Status: AC
  Filled 2024-01-04: qty 5

## 2024-01-04 MED ORDER — GLYCOPYRROLATE 0.2 MG/ML IJ SOLN
INTRAMUSCULAR | Status: AC
  Filled 2024-01-04: qty 1

## 2024-01-04 MED ORDER — PHENYLEPHRINE 80 MCG/ML (10ML) SYRINGE FOR IV PUSH (FOR BLOOD PRESSURE SUPPORT)
PREFILLED_SYRINGE | INTRAVENOUS | Status: DC | PRN
  Administered 2024-01-04 (×2): 160 ug via INTRAVENOUS
  Administered 2024-01-04: 80 ug via INTRAVENOUS

## 2024-01-04 MED ORDER — DICLOFENAC SUPPOSITORY 100 MG
RECTAL | Status: AC
  Filled 2024-01-04: qty 1

## 2024-01-04 MED ORDER — INDOCYANINE GREEN 25 MG IV SOLR
1.2500 mg | Freq: Once | INTRAVENOUS | Status: AC
  Administered 2024-01-06: 1.25 mg via INTRAVENOUS
  Filled 2024-01-04: qty 10

## 2024-01-04 MED ORDER — ALBUTEROL SULFATE (2.5 MG/3ML) 0.083% IN NEBU: 3.0000 mL | INHALATION_SOLUTION | RESPIRATORY_TRACT | Status: DC | PRN

## 2024-01-04 MED ORDER — MOMETASONE FURO-FORMOTEROL FUM 100-5 MCG/ACT IN AERO
2.0000 | INHALATION_SPRAY | Freq: Two times a day (BID) | RESPIRATORY_TRACT | Status: DC
  Administered 2024-01-04 – 2024-01-07 (×5): 2 via RESPIRATORY_TRACT
  Filled 2024-01-04: qty 8.8

## 2024-01-04 MED ORDER — DEXMEDETOMIDINE HCL IN NACL 80 MCG/20ML IV SOLN
INTRAVENOUS | Status: AC
  Filled 2024-01-04: qty 20

## 2024-01-04 MED ORDER — ONDANSETRON HCL 4 MG/2ML IJ SOLN
4.0000 mg | Freq: Four times a day (QID) | INTRAMUSCULAR | Status: DC | PRN
  Administered 2024-01-06 (×2): 4 mg via INTRAVENOUS
  Filled 2024-01-04 (×3): qty 2

## 2024-01-04 MED ORDER — LACTATED RINGERS IV BOLUS
500.0000 mL | Freq: Once | INTRAVENOUS | Status: AC
  Administered 2024-01-04: 500 mL via INTRAVENOUS

## 2024-01-04 MED ORDER — ROPINIROLE HCL 1 MG PO TABS
1.0000 mg | ORAL_TABLET | Freq: Every day | ORAL | Status: DC
  Administered 2024-01-04: 1 mg via ORAL
  Filled 2024-01-04: qty 1

## 2024-01-04 MED ORDER — ONDANSETRON HCL 4 MG PO TABS: 4.0000 mg | ORAL_TABLET | Freq: Four times a day (QID) | ORAL | Status: DC | PRN

## 2024-01-04 MED ORDER — INSULIN ASPART 100 UNIT/ML IJ SOLN
0.0000 [IU] | Freq: Three times a day (TID) | INTRAMUSCULAR | Status: DC
Start: 2024-01-04 — End: 2024-01-06
  Administered 2024-01-04: 3 [IU] via SUBCUTANEOUS
  Administered 2024-01-05 (×2): 5 [IU] via SUBCUTANEOUS
  Filled 2024-01-04 (×3): qty 1

## 2024-01-04 MED ORDER — DICLOFENAC SUPPOSITORY 100 MG
100.0000 mg | Freq: Once | RECTAL | Status: DC
  Filled 2024-01-04: qty 1

## 2024-01-04 MED ORDER — PROPOFOL 500 MG/50ML IV EMUL
INTRAVENOUS | Status: DC | PRN
  Administered 2024-01-04: 75 ug/kg/min via INTRAVENOUS

## 2024-01-04 MED ORDER — METHYLPREDNISOLONE SODIUM SUCC 125 MG IJ SOLR
80.0000 mg | INTRAMUSCULAR | Status: DC
  Administered 2024-01-05: 80 mg via INTRAVENOUS
  Filled 2024-01-04: qty 2

## 2024-01-04 MED ORDER — HYDROMORPHONE HCL 1 MG/ML IJ SOLN
0.5000 mg | Freq: Once | INTRAMUSCULAR | Status: AC
  Administered 2024-01-04: 0.5 mg via INTRAVENOUS
  Filled 2024-01-04: qty 0.5

## 2024-01-04 MED ORDER — ONDANSETRON HCL 4 MG/2ML IJ SOLN
4.0000 mg | Freq: Once | INTRAMUSCULAR | Status: AC
  Administered 2024-01-05: 4 mg via INTRAVENOUS

## 2024-01-04 MED ORDER — BUDESONIDE 0.25 MG/2ML IN SUSP
0.2500 mg | Freq: Two times a day (BID) | RESPIRATORY_TRACT | Status: DC
Start: 2024-01-04 — End: 2024-01-07
  Administered 2024-01-05 – 2024-01-07 (×4): 0.25 mg via RESPIRATORY_TRACT
  Filled 2024-01-04 (×5): qty 2

## 2024-01-04 MED ORDER — ACETAMINOPHEN 325 MG RE SUPP
325.0000 mg | Freq: Once | RECTAL | Status: AC
  Administered 2024-01-04: 325 mg via RECTAL
  Filled 2024-01-04: qty 1

## 2024-01-04 MED ORDER — SODIUM CHLORIDE 0.9 % IV SOLN
2.0000 g | INTRAVENOUS | Status: DC
  Administered 2024-01-05 – 2024-01-06 (×2): 2 g via INTRAVENOUS
  Filled 2024-01-04 (×3): qty 20

## 2024-01-04 MED ORDER — IPRATROPIUM-ALBUTEROL 0.5-2.5 (3) MG/3ML IN SOLN
3.0000 mL | Freq: Once | RESPIRATORY_TRACT | Status: AC
  Administered 2024-01-04: 3 mL via RESPIRATORY_TRACT

## 2024-01-04 MED ORDER — PROPOFOL 1000 MG/100ML IV EMUL
INTRAVENOUS | Status: AC
  Filled 2024-01-04: qty 100

## 2024-01-04 MED ORDER — METRONIDAZOLE 500 MG/100ML IV SOLN
500.0000 mg | Freq: Once | INTRAVENOUS | Status: AC
  Administered 2024-01-04: 500 mg via INTRAVENOUS
  Filled 2024-01-04: qty 100

## 2024-01-04 MED ORDER — SUMATRIPTAN SUCCINATE 50 MG PO TABS: 50.0000 mg | ORAL_TABLET | ORAL | Status: DC | PRN

## 2024-01-04 MED ORDER — GLYCOPYRROLATE 0.2 MG/ML IJ SOLN
INTRAMUSCULAR | Status: DC | PRN
Start: 2024-01-04 — End: 2024-01-04
  Administered 2024-01-04: .2 mg via INTRAVENOUS

## 2024-01-04 NOTE — ED Notes (Signed)
[facility] at bedside for transport to [facility]

## 2024-01-04 NOTE — Progress Notes (Signed)
PHARMACIST - PHYSICIAN COMMUNICATION   CONCERNING: Methylprednisolone IV    Current order: Methylprednisolone IV 40 mg IV BID     DESCRIPTION: Per [place] Protocol:   IV methylprednisolone will be converted to either a q12h or q24h frequency with the same total daily dose (TDD).  Ordered Dose: 1 to 125 mg TDD; convert to: TDD q24h.  Ordered Dose: 126 to 250 mg TDD; convert to: TDD div q12h.  Ordered Dose: >250 mg TDD; DAW.  Order has been adjusted to: Methylprednisolone IV 80 mg IV q24h   Barrie Folk , PharmD Clinical Pharmacist  01/04/2024 11:29 AM

## 2024-01-04 NOTE — ED Notes (Signed)
Pt ambulatory to the bathroom inside of the room, minimal assistance needed.

## 2024-01-04 NOTE — Consult Note (Signed)
Patient ID: [name], female   DOB: 09/17/1966, 58 y.o.   MRN: [id number]  HPI [name] is a 58 y.o. female in in consultation at the request of Dr.Wohl.  She reported about a day history of abdominal pain epigastric area mild to moderate, dull intermittent worsening with meals. I have personally reviewed CT as well as MRCP showing evidence of choledocholithiasis and acute cholecystitis. She does have a history of tracheostomy C-section and tubal ligation.  She does have history of COPD Blood work showed hemoglobin 13.8, WBC 16.5, glucose 312, creatinine 0.4 AST 346 ALT 162, bilirubin 1.8 alk phos 141  RCP performed today with satisfactory recreation of the stone HPI  Past Medical History:  Diagnosis Date   Angina    Anxiety state 10/15/2015   ARDS (adult respiratory distress syndrome) (HCC)    Jan 2011   Asthma    Chronic back pain    COPD (chronic obstructive pulmonary disease) (HCC)    Diabetes mellitus    Gallstones with biliary obstruction    GERD (gastroesophageal reflux disease) 12/21/2012   HTN (hypertension) 10/15/2015   Hyperglycemia, drug-induced    steroid induced hyperglycemia   Migraine headache    On home O2    Pneumonia    Recurrent upper respiratory infection (URI)    Shortness of breath     Past Surgical History:  Procedure Laterality Date   c-section     TRACHEOSTOMY     decannulated 12/2009   TUBAL LIGATION     uterine ablasion      Family History  Problem Relation Age of Onset   Coronary artery disease Brother    Diabetes Other    Cancer Other    Hypertension Other     Social History Social History   Tobacco Use   Smoking status: Every Day    Current packs/day: 0.50    Average packs/day: 0.5 packs/day for 42.0 years (21.0 ttl pk-yrs)    Types: Cigarettes   Smokeless tobacco: Never  Vaping Use   Vaping status: Never Used  Substance Use Topics   Alcohol use: No   Drug use: No    Comment: Formerly used cocaine , narcotics, THC     Allergies  Allergen Reactions   Levofloxacin Anaphylaxis   Levofloxacin In D5w Itching, Swelling and Rash    IV only IV only   Penicillins Other (See Comments)    convulsions   Doxycycline Nausea And Vomiting   Morphine Nausea Only    Current Facility-Administered Medications  Medication Dose Route Frequency Provider Last Rate Last Admin   [MAR Hold] budesonide (PULMICORT) nebulizer solution 0.25 mg  0.25 mg Nebulization BID Emeline General, MD       [facility] Hold] cefTRIAXone (ROCEPHIN) 2 g in sodium chloride 0.9 % 100 mL IVPB  2 g Intravenous Q24H Emeline General, MD       [MAR Hold] diclofenac suppository 100 mg  100 mg Rectal Once Midge Minium, MD       [facility] Hold] HYDROmorphone (DILAUDID) injection 1 mg  1 mg Intravenous Q2H PRN Emeline General, MD       [facility] Hold] insulin aspart (novoLOG) injection 0-15 Units  0-15 Units Subcutaneous TID WC Emeline General, MD       [MAR Hold] ipratropium-albuterol (DUONEB) 0.5-2.5 (3) MG/3ML nebulizer solution 3 mL  3 mL Nebulization Q6H Emeline General, MD       [facility] Hold] methylPREDNISolone sodium succinate (SOLU-MEDROL) 125 mg/2 mL injection  80 mg  80 mg Intravenous Q24H Emeline General, MD       [facility] Hold] metroNIDAZOLE (FLAGYL) IVPB 500 mg  500 mg Intravenous Q12H Emeline General, MD       [facility] Hold] ondansetron [facility]) injection 4 mg  4 mg Intravenous Once Emeline General, MD         Review of Systems Full ROS  was asked and was negative except for the information on the HPI  Physical Exam Blood pressure 96/64, pulse 93, temperature (!) 97.1 F (36.2 C), temperature source Temporal, resp. rate 16, SpO2 98%. CONSTITUTIONAL: NAD alert. EYES: Pupils are equal, round, and reactive to light, Sclera are non-icteric. EARS, NOSE, MOUTH AND THROAT: The oropharynx is clear. The oral mucosa is pink and moist. Hearing is intact to voice. LYMPH NODES:  Lymph nodes in the neck are normal. RESPIRATORY: Exam with expiratory wheezing and decreased in the bases.  there is normal respiratory effort, with equal breath sounds bilaterally, and without pathologic use of accessory muscles. CARDIOVASCULAR: Heart is regular without murmurs, gallops, or rubs. GI: The abdomen is  soft, tender to palpation epigastric area and right upper quadrant consistent with Murphy sign,  There are no palpable masses. There is no hepatosplenomegaly. There are normal bowel sounds GU: Rectal deferred.   MUSCULOSKELETAL: Normal muscle strength and tone. No cyanosis or edema.   SKIN: Turgor is good and there are no pathologic skin lesions or ulcers. NEUROLOGIC: Motor and sensation is grossly normal. Cranial nerves are grossly intact. PSYCH:  Oriented to person, place and time. Affect is normal.  Data Reviewed  I have personally reviewed the patient's imaging, laboratory findings and medical records.    Assessment/Plan 58 year old female with signs consistent with acute cholecystitis as well as choledocholithiasis. She already had her ERCP.  She is currently not toxic.   Will continue appropriate medical management with antibiotic after fluids.  Will make sure that she does not develop any ERCP complications and tentatively get her on the schedule this Thursday . Will definitely benefit from cholecystectomy during this hospitalization The risks, benefits, complications, treatment options, and expected outcomes were discussed with the patient. The possibilities of bleeding, recurrent infection, finding a normal gallbladder, perforation of viscus organs, damage to surrounding structures, bile leak, abscess formation, needing a drain placed, the need for additional procedures, reaction to medication, pulmonary aspiration,  failure to diagnose a condition, the possible need to convert to an open procedure, and creating a complication requiring transfusion or operation were discussed with the patient. The patient and/or family concurred with the proposed plan, giving informed consent.    Sterling Big, MD FACS General Surgeon 01/04/2024, 3:38 PM

## 2024-01-04 NOTE — ED Provider Notes (Addendum)
  Physical Exam  BP 110/77   Pulse 99   Temp 98.6 F (37 C) (Oral)   Resp (!) 24   Ht 5\' 1"  (1.549 m)   Wt 71 kg   SpO2 94%   BMI 29.58 kg/m   Physical Exam  Procedures  .Critical Care  Performed by: Derwood Kaplan, MD Authorized by: Derwood Kaplan, MD   Critical care provider statement:    Critical care time (minutes):  35   Critical care was necessary to treat or prevent imminent or life-threatening deterioration of the following conditions:  Hepatic failure   Critical care was time spent personally by me on the following activities:  Development of treatment plan with patient or surrogate, discussions with consultants, examination of patient, ordering and review of laboratory studies, ordering and review of radiographic studies, re-evaluation of patient's condition and evaluation of patient's response to treatment   ED Course / MDM   Clinical Course as of 01/04/24 0823  Tue Jan 04, 2024  0421 D/w [facility] on call and none of the physicians on call for this week do ERCP's and thus can not accept to [facility]. Suggests [facility] or [place].  [JM]    Clinical Course User Index [JM] Mesner, Barbara Cower, MD   Medical Decision Making Amount and/or Complexity of Data Reviewed Labs: ordered. Radiology: ordered.  Risk Prescription drug management.   I had assumed care of this patient at 7: 30 a.m. from Dr. Clayborne Dana.  Patient has right upper quadrant abdominal tenderness with positive Murphy's.  CT concerning for cholecystitis.  Case was discussed with EGS, Dr. Lovell Sheehan who recommends MRCP.  MRCP was completed overnight and does show evidence of choledocholithiasis. I have discussed the case with Dr. Lovell Sheehan, who recommends at this time that patient needs ERCP.  Dr. Clayborne Dana is already spoken with multiple GI doctors including [facility] and [facility], and they will not have anyone on-call to do ERCP until next Monday.  One of the GI doctor suggested discussing case with [facility], and he was made aware that Dr. Servando Snare, GI could help.  I have reviewed patient's workup.  She is noted to have elevated LFTs and elevated white count.  Bilirubin is 1.8.  Dr. Servando Snare is willing to accept the patient, but he is not an admitting physician.  He made [place] aware that if patient was sent to ED at [facility], he will be happy to assist.  Hospitalists at [facility] do not have privilege to admit the patient at [facility].   Patient is comfortable with [place] transfer. Dr. Arnoldo Morale, EDP at [facility] accepting the patient given the circumstances for ED to ED transfer.  She is aware that additional consulting services including admitting team will need to be consulted upon patient's arrival. Dr. Lovell Sheehan, Surgery recommends transfer as well for prompt ERCP.  Pain medicine given.  EMTALA completed.     Derwood Kaplan, MD 01/04/24 0830  9:03 AM Nursing staff made me aware that patient now spiking a fever. Will order blood cultures. I have noted that patient had her antibiotics last night at 10 PM.  Will order cefepime and Flagyl.     Derwood Kaplan, MD 01/04/24 [phone]

## 2024-01-04 NOTE — Consult Note (Signed)
Midge Minium, MD [facility]  [address], [address] [place], [place] [address] Phone: [phone] Fax : [phone]  Consultation  Referring Provider:     Dr. Chipper Herb Primary Care Physician:  Kara Pacer, NP Primary Gastroenterologist:  Dr. Valetta Fuller         Reason for Consultation:     CBD stone  Date of Admission:  01/04/2024 Date of Consultation:  01/04/2024         HPI:   [name] is a 58 y.o. female who is transferred from an outside hospital due to a MRCP showing a common bile duct stone.  The MRCP showed:  IMPRESSION: 1. Study is positive for choledocholithiasis with 5-6 mm stones in the distal common bile duct shortly before the level of the ampulla with proximal biliary tract dilatation. 2. There is also cholelithiasis with evidence of acute cholecystitis, as above. 3. Patchy areas of diffusion restriction in the right kidney which could suggest areas of pyelonephritis or developing renal infarcts. 4. Hepatic steatosis.  The patient's labs also were abnormal and showed:  Component     Latest Ref Rng 01/03/2024 01/04/2024  AST     15 - 41 U/L 346 (H)  378 (H)   ALT     0 - 44 U/L 162 (H)  304 (H)   Alkaline Phosphatase     38 - 126 U/L 141 (H)  176 (H)   Total Bilirubin     0.0 - 1.2 mg/dL 1.8 (H)  3.3 (H)    She had an elevated white cell count of 11.1 with her white count yesterday at 16.5.  Patient has a history of COPD gallstones with type 2 diabetes and reported worsening COPD with a cough and shortness of breath.  She also had new onset right upper quadrant pain with flank pain.  Due to the abdominal pain at [facility] she had the MRCP that confirmed the CBD stone.  Past Medical History:  Diagnosis Date   Angina    Anxiety state 10/15/2015   ARDS (adult respiratory distress syndrome) (HCC)    Jan 2011   Asthma    Chronic back pain    COPD (chronic obstructive pulmonary disease) (HCC)    Diabetes mellitus    Gallstones with  biliary obstruction    GERD (gastroesophageal reflux disease) 12/21/2012   HTN (hypertension) 10/15/2015   Hyperglycemia, drug-induced    steroid induced hyperglycemia   Migraine headache    On home O2    Pneumonia    Recurrent upper respiratory infection (URI)    Shortness of breath     Past Surgical History:  Procedure Laterality Date   c-section     TRACHEOSTOMY     decannulated 12/2009   TUBAL LIGATION     uterine ablasion      Prior to Admission medications   Medication Sig Start Date End Date Taking? Authorizing Provider  OZEMPIC, 0.25 OR 0.5 MG/DOSE, 2 MG/3ML SOPN Inject 0.5 mg into the skin once a week. Tuesdays. 12/28/23  Yes [provider]  acetaminophen (TYLENOL) 500 MG tablet Take 500 mg by mouth every 6 (six) hours as needed for fever or mild pain.    [provider]  albuterol (PROVENTIL) (2.5 MG/3ML) 0.083% nebulizer solution Take 3 mLs (2.5 mg total) by nebulization every 6 (six) hours as needed for wheezing or shortness of breath. 03/13/22   Mariea Clonts, Courage, MD  albuterol (VENTOLIN HFA) 108 (90 Base) MCG/ACT inhaler Inhale 2  puffs into the lungs every 4 (four) hours as needed for wheezing or shortness of breath. 03/13/22   Mariea Clonts, Courage, MD  glipiZIDE (GLUCOTROL) 5 MG tablet Take 2 tablets (10 mg total) by mouth daily before breakfast. 04/23/23   Tat, Onalee Hua, MD  metFORMIN (GLUCOPHAGE) 500 MG tablet Take 1 tablet (500 mg total) by mouth 2 (two) times daily with a meal. 04/23/23   Tat, Onalee Hua, MD  methocarbamol (ROBAXIN) 500 MG tablet Take 500 mg by mouth every 8 (eight) hours as needed for muscle spasms. 08/13/21   [provider]  ondansetron (ZOFRAN-ODT) 4 MG disintegrating tablet 4mg  ODT q4 hours prn nausea/vomit Patient taking differently: Take 4 mg by mouth every 4 (four) hours as needed for nausea or vomiting. 4mg  ODT q4 hours prn nausea/vomit 04/19/22   Bethann Berkshire, MD  pantoprazole (PROTONIX) 40 MG tablet Take 1 tablet (40 mg total) by  mouth daily. 03/13/22   Shon Hale, MD  rOPINIRole (REQUIP) 1 MG tablet Take 1 mg by mouth at bedtime. 03/09/23   [provider]  rosuvastatin (CRESTOR) 40 MG tablet Take 40 mg by mouth daily. 08/17/23   [provider]  SUMAtriptan (IMITREX) 50 MG tablet Take 50 mg by mouth as needed for migraine or headache. 11/29/21   [provider]  SYMBICORT 80-4.5 MCG/ACT inhaler Inhale 2 puffs into the lungs 2 (two) times daily. 12/28/23   [provider]  ARIPiprazole (ABILIFY) 5 MG tablet Take 5 mg by mouth daily.    02/03/12  [provider]  venlafaxine (EFFEXOR) 75 MG tablet Take 75 mg by mouth daily.    02/03/12  [provider]    Family History  Problem Relation Age of Onset   Coronary artery disease Brother    Diabetes Other    Cancer Other    Hypertension Other      Social History   Tobacco Use   Smoking status: Every Day    Current packs/day: 0.50    Average packs/day: 0.5 packs/day for 42.0 years (21.0 ttl pk-yrs)    Types: Cigarettes   Smokeless tobacco: Never  Vaping Use   Vaping status: Never Used  Substance Use Topics   Alcohol use: No   Drug use: No    Comment: Formerly used cocaine , narcotics, THC    Allergies as of 01/04/2024 - Review Complete 01/04/2024  Allergen Reaction Noted   Levofloxacin Anaphylaxis 08/17/2021   Levofloxacin in d5w Itching, Swelling, and Rash 04/21/2013   Penicillins Other (See Comments)    Doxycycline Nausea And Vomiting 01/04/2020   Morphine Nausea Only     Review of Systems:    All systems reviewed and negative except where noted in HPI.   Physical Exam:  Vital signs in last 24 hours: Temp:  [98.5 F (36.9 C)-100.1 F (37.8 C)] 100 F (37.8 C) (02/11 0925) Pulse Rate:  [93-115] 93 (02/11 1042) Resp:  [13-27] 18 (02/11 1042) BP: (106-170)/(62-100) 106/74 (02/11 1042) SpO2:  [90 %-98 %] 96 % (02/11 1042) Weight:  [71 kg] 71 kg (02/10 1619)   General:   Pleasant, cooperative  in NAD Head:  Normocephalic and atraumatic. Eyes:   No icterus.   Conjunctiva pink. PERRLA. Ears:  Normal auditory acuity. Neck:  Supple; no masses or thyroidomegaly Lungs: Respirations even and unlabored. Lungs with decreased breath sounds bilaterally.  There were some wheezing present.  Bilaterally.    Heart:  Regular rate and rhythm;  Without murmur, clicks, rubs or gallops Abdomen:  Soft,  nondistended, positive right upper quadrant tenderness. Normal bowel sounds. No appreciable masses or hepatomegaly.  No rebound or guarding.  Rectal:  Not performed. Msk:  Symmetrical without gross deformities.    Extremities:  Without edema, cyanosis or clubbing. Neurologic:  Alert and oriented x3;  grossly normal neurologically. Skin:  Intact without significant lesions or rashes. Cervical Nodes:  No significant cervical adenopathy. Psych:  Alert and cooperative. Normal affect.  LAB RESULTS: Recent Labs    01/03/24 1718 01/04/24 1106  WBC 16.5* 11.1*  HGB 13.8 12.2  HCT 42.6 37.4  PLT 356 301   BMET Recent Labs    01/03/24 1718 01/04/24 1106  NA 134* 131*  K 4.0 3.6  CL 91* 90*  CO2 31 29  GLUCOSE 312* 228*  BUN 9 9  CREATININE 0.49 0.46  CALCIUM 9.5 8.2*   LFT Recent Labs    01/04/24 1106  PROT 6.8  ALBUMIN 3.1*  AST 378*  ALT 304*  ALKPHOS 176*  BILITOT 3.3*   PT/INR No results for input(s): "LABPROT", "INR" in the last 72 hours.  STUDIES: MR ABDOMEN MRCP WO CONTRAST Result Date: 01/04/2024 CLINICAL DATA:  58 year old female with history of cholelithiasis. Generalized abdominal pain. EXAM: MRI ABDOMEN WITHOUT CONTRAST  (INCLUDING MRCP) TECHNIQUE: Multiplanar multisequence MR imaging of the abdomen was performed. Heavily T2-weighted images of the biliary and pancreatic ducts were obtained, and three-dimensional MRCP images were rendered by post processing. COMPARISON:  No prior abdominal MRI. CT of the abdomen and pelvis 01/03/2024. FINDINGS: Comment: Today's study is  limited for detection and characterization of visceral and/or vascular lesions by lack of IV gadolinium. Portions of today's examination are also limited by considerable patient respiratory motion. Lower chest: Unremarkable. Hepatobiliary: Heterogeneous loss of signal intensity throughout the hepatic parenchyma during out of phase dual echo images, indicative of a background of hepatic steatosis. No definite suspicious cystic or solid hepatic lesions are confidently identified on today's noncontrast examination. Gallbladder is severely distended. There are multiple filling defects lying dependently in the neck of the gallbladder, largest of which measures up to 1.9 cm in diameter. Gallbladder wall appears thickened and edematous with trace volume of pericholecystic fluid. In the distal common bile duct shortly before the level of the ampulla (coronal image 79 of series 9) there is a small 5-6 mm filling defects which likely reflect choledocholithiasis. This is associated with proximal biliary tract dilatation, with the common bile duct measuring up to 13 mm in the porta hepatis. There is also mild intrahepatic biliary ductal dilatation. Pancreas: No definite pancreatic mass or peripancreatic fluid collections or inflammatory changes confidently identified on today's noncontrast examination. No pancreatic ductal dilatation. Spleen:  Unremarkable. Adrenals/Urinary Tract: T1 hypointense, T2 hyperintense lesions in the left kidney, incompletely characterized on today's noncontrast CT examination, but likely cysts (no imaging follow-up recommended) measuring up to 1.6 cm in the lower pole. There also some wedge-shaped areas of decreased diffusion in the lower pole of the right kidney and posterolateral interpolar region of the right kidney, suspicious for potential areas of pyelonephritis and/or developing infarcts. No hydroureteronephrosis in the visualized portions of the abdomen. Bilateral adrenal glands are normal in  appearance. Stomach/Bowel: Visualized portions are unremarkable. Vascular/Lymphatic: No aneurysm identified in the visualized abdominal vasculature on today's noncontrast examination. Other:  No significant volume of ascites. Musculoskeletal: No aggressive appearing osseous lesions are noted in the visualized portions of the skeleton. IMPRESSION: 1. Study is positive for choledocholithiasis with 5-6 mm stones in the distal common bile duct  shortly before the level of the ampulla with proximal biliary tract dilatation. 2. There is also cholelithiasis with evidence of acute cholecystitis, as above. 3. Patchy areas of diffusion restriction in the right kidney which could suggest areas of pyelonephritis or developing renal infarcts. 4. Hepatic steatosis. Electronically Signed   By: Trudie Reed M.D.   On: 01/04/2024 05:23   CT ABDOMEN PELVIS W CONTRAST Result Date: 01/03/2024 CLINICAL DATA:  Generalized abdominal pain radiating to the back. History of gallstones. EXAM: CT ABDOMEN AND PELVIS WITH CONTRAST TECHNIQUE: Multidetector CT imaging of the abdomen and pelvis was performed using the standard protocol following bolus administration of intravenous contrast. RADIATION DOSE REDUCTION: This exam was performed according to the departmental dose-optimization program which includes automated exposure control, adjustment of the mA and/or kV according to patient size and/or use of iterative reconstruction technique. CONTRAST:  OMNIPAQUE IOHEXOL 300 MG/ML  SOLN COMPARISON:  08/17/2021 FINDINGS: Lower chest: Redemonstration of innumerable tree in bud opacities at the lung bases, right more than left, consistent with chronic inflammatory disease, possibly atypical pneumonia. No dense consolidation or lobar collapse. No effusion. Hepatobiliary: Liver parenchyma is normal. The gallbladder is distended and there is surrounding edema. The patient is known to have gallstones but these are not calcified and not visible  by CT. There is intra and extrahepatic ductal dilatation, presumably indicating choledocholithiasis. I do not see a pancreatic head mass. Pancreas: No pancreatic head mass identified. Presumed choledocholithiasis. No sign of pancreatitis. Spleen: Normal Adrenals/Urinary Tract: Adrenal glands are normal. No significant renal finding. Few small cysts. Normal bladder. Stomach/Bowel: Stomach and small intestine are normal. Normal appendix. Normal colon. Vascular/Lymphatic: Aorta and IVC are normal.  No adenopathy. Reproductive: No pelvic mass. Other: No free fluid or air. Musculoskeletal: Ordinary lumbar degenerative changes. Old minor compression deformity T12. IMPRESSION: 1. Distended gallbladder with surrounding edema. The patient is known to have gallstones but these are not calcified and not visible by CT. Findings quite worrisome for acute cholecystitis and obstruction. There is intra and extrahepatic ductal dilatation, presumably indicating choledocholithiasis responsible for biliary ductal obstruction. 2. Redemonstration of innumerable tree in bud opacities at the lung bases, right more than left, consistent with chronic inflammatory disease, possibly atypical pneumonia. Electronically Signed   By: Paulina Fusi M.D.   On: 01/03/2024 21:30      Impression / Plan:   Assessment: Principal Problem:   Cholecystitis Active Problems:   COPD with acute exacerbation (HCC)   Choledocholithiasis   Acute pyelonephritis   Joclynn A Scrivens is a 58 y.o. y/o female with who comes in with a positive MRCP and labs that appear to show obstructive jaundice.  The patient also has right upper quadrant pain.  The patient was transferred here from [facility] for an ERCP.  Plan:  The patient will be brought to the endoscopy suite for an ERCP.  The patient has been explained the risk benefits alternatives of the ERCP including but not limited to bleeding perforation pancreatitis and death.  The patient has agreed  to undergoing the procedure.  After the procedure the patient will need to be seen by surgery for a cholecystectomy to avoid this from happening again.  Thank you for involving me in the care of this patient.      LOS: 0 days   Midge Minium, MD, MD. Clementeen Graham 01/04/2024, 1:00 PM,  Pager [phone] 7am-5pm  Check AMION for 5pm -7am coverage and on weekends   Note: This dictation was prepared with Reubin Milan  dictation along with smaller phrase technology. Any transcriptional errors that result from this process are unintentional.

## 2024-01-04 NOTE — Consult Note (Signed)
Reason for Consult: Right upper quadrant abdominal pain Referring Physician: Dr. Tennis Ship is an 58 y.o. female.  HPI: Patient is a 58 year old white female with multiple comorbidities including COPD on home O2 who presented to the emergency room with a 24-hour history of worsening right upper quadrant abdominal pain.  She also states she has had intermittent shortness of breath for some time now.  She does have some nausea but no vomiting.  Patient was noted on blood work to have elevated LFTs with a total bilirubin of 1.8.  White blood cell count 16.5.  CT scan of the abdomen revealed a distended gallbladder with surrounding edema.  There was also intra and extrahepatic ductal dilatation.  MRCP was performed which revealed choledocholithiasis.  Past Medical History:  Diagnosis Date   Angina    Anxiety state 10/15/2015   ARDS (adult respiratory distress syndrome) (HCC)    Jan 2011   Asthma    Chronic back pain    COPD (chronic obstructive pulmonary disease) (HCC)    Diabetes mellitus    GERD (gastroesophageal reflux disease) 12/21/2012   HTN (hypertension) 10/15/2015   Hyperglycemia, drug-induced    steroid induced hyperglycemia   Migraine headache    On home O2    Pneumonia    Recurrent upper respiratory infection (URI)    Shortness of breath     Past Surgical History:  Procedure Laterality Date   c-section     TRACHEOSTOMY     decannulated 12/2009   TUBAL LIGATION     uterine ablasion      Family History  Problem Relation Age of Onset   Coronary artery disease Brother    Diabetes Other    Cancer Other    Hypertension Other     Social History:  reports that she has been smoking cigarettes. She has a 21 pack-year smoking history. She has never used smokeless tobacco. She reports that she does not drink alcohol and does not use drugs.  Allergies:  Allergies  Allergen Reactions   Levofloxacin Anaphylaxis   Levofloxacin In D5w Itching, Swelling and Rash     IV only IV only   Penicillins Other (See Comments)    convulsions   Doxycycline Nausea And Vomiting   Morphine Nausea Only    Medications: I have reviewed the patient's current medications. Prior to Admission: (Not in a hospital admission)   Results for orders placed or performed during the hospital encounter of 01/03/24 (from the past 48 hours)  Urinalysis, Routine w reflex microscopic -Urine, Clean Catch     Status: Abnormal   Collection Time: 01/03/24  4:30 PM  Result Value Ref Range   Color, Urine AMBER (A) YELLOW    Comment: BIOCHEMICALS MAY BE AFFECTED BY COLOR   APPearance CLEAR CLEAR   Specific Gravity, Urine 1.015 1.005 - 1.030   pH 6.0 5.0 - 8.0   Glucose, UA 50 (A) NEGATIVE mg/dL   Hgb urine dipstick NEGATIVE NEGATIVE   Bilirubin Urine NEGATIVE NEGATIVE   Ketones, ur NEGATIVE NEGATIVE mg/dL   Protein, ur NEGATIVE NEGATIVE mg/dL   Nitrite POSITIVE (A) NEGATIVE   Leukocytes,Ua NEGATIVE NEGATIVE   RBC / HPF 0-5 0 - 5 RBC/hpf   WBC, UA 6-10 0 - 5 WBC/hpf   Bacteria, UA MANY (A) NONE SEEN   Squamous Epithelial / HPF 0-5 0 - 5 /HPF   Mucus PRESENT     Comment: Performed at [facility], [address], [place], [place] [address]  Lipase, blood     Status: None   Collection Time: 01/03/24  5:18 PM  Result Value Ref Range   Lipase 35 11 - 51 U/L    Comment: Performed at [facility], [address] [place], [place] [address]  Comprehensive metabolic panel     Status: Abnormal   Collection Time: 01/03/24  5:18 PM  Result Value Ref Range   Sodium 134 (L) 135 - 145 mmol/L   Potassium 4.0 3.5 - 5.1 mmol/L   Chloride 91 (L) 98 - 111 mmol/L   CO2 31 22 - 32 mmol/L   Glucose, Bld 312 (H) 70 - 99 mg/dL    Comment: Glucose reference range applies only to samples taken after fasting for at least 8 hours.   BUN 9 6 - 20 mg/dL   Creatinine, Ser 6.04 0.44 - 1.00 mg/dL   Calcium 9.5 8.9 - 54.0 mg/dL   Total Protein 7.8 6.5 - 8.1 g/dL   Albumin 3.8 3.5 - 5.0 g/dL   AST 981 (H)  15 - 41 U/L    Comment: RESULT CONFIRMED BY MANUAL DILUTION   ALT 162 (H) 0 - 44 U/L   Alkaline Phosphatase 141 (H) 38 - 126 U/L   Total Bilirubin 1.8 (H) 0.0 - 1.2 mg/dL   GFR, Estimated >19 >14 mL/min    Comment: (NOTE) Calculated using the CKD-EPI Creatinine Equation (2021)    Anion gap 12 5 - 15    Comment: Performed at [facility], [address], [place], [place] [address]  CBC with Differential     Status: Abnormal   Collection Time: 01/03/24  5:18 PM  Result Value Ref Range   WBC 16.5 (H) 4.0 - 10.5 K/uL   RBC 5.08 3.87 - 5.11 MIL/uL   Hemoglobin 13.8 12.0 - 15.0 g/dL   HCT 62.1 30.8 - 65.7 %   MCV 83.9 80.0 - 100.0 fL   MCH 27.2 26.0 - 34.0 pg   MCHC 32.4 30.0 - 36.0 g/dL   RDW 84.6 96.2 - 95.2 %   Platelets 356 150 - 400 K/uL   nRBC 0.0 0.0 - 0.2 %   Neutrophils Relative % 86 %   Neutro Abs 14.0 (H) 1.7 - 7.7 K/uL   Lymphocytes Relative 9 %   Lymphs Abs 1.5 0.7 - 4.0 K/uL   Monocytes Relative 5 %   Monocytes Absolute 0.8 0.1 - 1.0 K/uL   Eosinophils Relative 0 %   Eosinophils Absolute 0.0 0.0 - 0.5 K/uL   Basophils Relative 0 %   Basophils Absolute 0.0 0.0 - 0.1 K/uL   Immature Granulocytes 0 %   Abs Immature Granulocytes 0.06 0.00 - 0.07 K/uL    Comment: Performed at [facility], [address], [place], [place] [address]  Magnesium     Status: Abnormal   Collection Time: 01/03/24  5:18 PM  Result Value Ref Range   Magnesium 1.6 (L) 1.7 - 2.4 mg/dL    Comment: Performed at [facility], [address], [place], [place] [address]  Brain natriuretic peptide     Status: None   Collection Time: 01/03/24  5:18 PM  Result Value Ref Range   B Natriuretic Peptide 33.0 0.0 - 100.0 pg/mL    Comment: Performed at [facility], [address], [place], [place] [address]    MR ABDOMEN MRCP WO CONTRAST Result Date: 01/04/2024 CLINICAL DATA:  58 year old female with history of cholelithiasis. Generalized abdominal pain. EXAM: MRI ABDOMEN WITHOUT CONTRAST  (INCLUDING MRCP)  TECHNIQUE: Multiplanar multisequence MR imaging of the abdomen was performed. Heavily T2-weighted images of the biliary and pancreatic ducts were obtained, and three-dimensional MRCP images were rendered by post processing. COMPARISON:  No prior abdominal MRI. CT of the abdomen and pelvis 01/03/2024. FINDINGS: Comment: Today's study is limited for detection and characterization of visceral and/or vascular lesions by lack of IV gadolinium. Portions of today's examination are also limited by considerable patient respiratory motion. Lower chest: Unremarkable. Hepatobiliary: Heterogeneous loss of signal intensity throughout the hepatic parenchyma during out of phase dual echo images, indicative of a background of hepatic steatosis. No definite suspicious cystic or solid hepatic lesions are confidently identified on today's noncontrast examination. Gallbladder is severely distended. There are multiple filling defects lying dependently in the neck of the gallbladder, largest of which measures up to 1.9 cm in diameter. Gallbladder wall appears thickened and edematous with trace volume of pericholecystic fluid. In the distal common bile duct shortly before the level of the ampulla (coronal image 79 of series 9) there is a small 5-6 mm filling defects which likely reflect choledocholithiasis. This is associated with proximal biliary tract dilatation, with the common bile duct measuring up to 13 mm in the porta hepatis. There is also mild intrahepatic biliary ductal dilatation. Pancreas: No definite pancreatic mass or peripancreatic fluid collections or inflammatory changes confidently identified on today's noncontrast examination. No pancreatic ductal dilatation. Spleen:  Unremarkable. Adrenals/Urinary Tract: T1 hypointense, T2 hyperintense lesions in the left kidney, incompletely characterized on today's noncontrast CT examination, but likely cysts (no imaging follow-up recommended) measuring up to 1.6 cm in the lower pole.  There also some wedge-shaped areas of decreased diffusion in the lower pole of the right kidney and posterolateral interpolar region of the right kidney, suspicious for potential areas of pyelonephritis and/or developing infarcts. No hydroureteronephrosis in the visualized portions of the abdomen. Bilateral adrenal glands are normal in appearance. Stomach/Bowel: Visualized portions are unremarkable. Vascular/Lymphatic: No aneurysm identified in the visualized abdominal vasculature on today's noncontrast examination. Other:  No significant volume of ascites. Musculoskeletal: No aggressive appearing osseous lesions are noted in the visualized portions of the skeleton. IMPRESSION: 1. Study is positive for choledocholithiasis with 5-6 mm stones in the distal common bile duct shortly before the level of the ampulla with proximal biliary tract dilatation. 2. There is also cholelithiasis with evidence of acute cholecystitis, as above. 3. Patchy areas of diffusion restriction in the right kidney which could suggest areas of pyelonephritis or developing renal infarcts. 4. Hepatic steatosis. Electronically Signed   By: Trudie Reed M.D.   On: 01/04/2024 05:23   CT ABDOMEN PELVIS W CONTRAST Result Date: 01/03/2024 CLINICAL DATA:  Generalized abdominal pain radiating to the back. History of gallstones. EXAM: CT ABDOMEN AND PELVIS WITH CONTRAST TECHNIQUE: Multidetector CT imaging of the abdomen and pelvis was performed using the standard protocol following bolus administration of intravenous contrast. RADIATION DOSE REDUCTION: This exam was performed according to the departmental dose-optimization program which includes automated exposure control, adjustment of the mA and/or kV according to patient size and/or use of iterative reconstruction technique. CONTRAST:  OMNIPAQUE IOHEXOL 300 MG/ML  SOLN COMPARISON:  08/17/2021 FINDINGS: Lower chest: Redemonstration of innumerable tree in bud opacities at the lung bases,  right more than left, consistent with chronic inflammatory disease, possibly atypical pneumonia. No dense consolidation or lobar collapse. No effusion. Hepatobiliary: Liver parenchyma is normal. The gallbladder is distended and there is surrounding edema. The patient is known to have gallstones but these are  not calcified and not visible by CT. There is intra and extrahepatic ductal dilatation, presumably indicating choledocholithiasis. I do not see a pancreatic head mass. Pancreas: No pancreatic head mass identified. Presumed choledocholithiasis. No sign of pancreatitis. Spleen: Normal Adrenals/Urinary Tract: Adrenal glands are normal. No significant renal finding. Few small cysts. Normal bladder. Stomach/Bowel: Stomach and small intestine are normal. Normal appendix. Normal colon. Vascular/Lymphatic: Aorta and IVC are normal.  No adenopathy. Reproductive: No pelvic mass. Other: No free fluid or air. Musculoskeletal: Ordinary lumbar degenerative changes. Old minor compression deformity T12. IMPRESSION: 1. Distended gallbladder with surrounding edema. The patient is known to have gallstones but these are not calcified and not visible by CT. Findings quite worrisome for acute cholecystitis and obstruction. There is intra and extrahepatic ductal dilatation, presumably indicating choledocholithiasis responsible for biliary ductal obstruction. 2. Redemonstration of innumerable tree in bud opacities at the lung bases, right more than left, consistent with chronic inflammatory disease, possibly atypical pneumonia. Electronically Signed   By: Paulina Fusi M.D.   On: 01/03/2024 21:30    ROS:  Pertinent items are noted in HPI.  Blood pressure 119/74, pulse (!) 103, temperature 98.6 F (37 C), temperature source Oral, resp. rate 20, height 5\' 1"  (1.549 m), weight 71 kg, SpO2 94%. Physical Exam: White female in mild discomfort due to abdominal pain. Head is normocephalic, atraumatic Lungs are relatively clear with  decreased inspiratory effort noted. Heart examination reveals regular rate and rhythm without S3, S4, murmurs Abdomen is rotund but soft.  Patient does have right upper quadrant abdominal pain to palpation.  No rigidity is noted.  Assessment/Plan: Impression: Cholelithiasis with cholecystitis, choledocholithiasis with cholangitis Plan: Patient needs ERCP for her choledocholithiasis urgently as she does meet the criteria for having cholangitis.  As we do not have an interventional endoscopist who does ERCP at [facility], the patient is being transferred to [facility] for further evaluation and treatment.  Her cholecystectomy can be done during this admission or electively as an outpatient.  Patient may be referred back to our office as an outpatient for elective cholecystectomy as needed.  Franky Macho 01/04/2024, 8:28 AM

## 2024-01-04 NOTE — ED Notes (Signed)
Patient transported to MRI

## 2024-01-04 NOTE — ED Triage Notes (Addendum)
Pt arrives via [facility] from [facility] for abdominal pain in the RUQ. Diagnosed with gallstones and needs an ERCP that will be performed at [facility].   EMS vitals: 103/70 BP HR between 80-90 94-97% on 3L [place]

## 2024-01-04 NOTE — ED Notes (Signed)
MD made aware of oral temperature of 100.1 F

## 2024-01-04 NOTE — ED Provider Notes (Signed)
[facility] Provider Note    Event Date/Time   First MD Initiated Contact with Patient 01/04/24 1035     (approximate)   History   Abdominal Pain   HPI  [name] is a 58 y.o. female past medical history significant for COPD on home oxygen, who presents to the emergency department as a transfer from outside hospital for ERCP.  Patient was initially evaluated at [facility] for abdominal pain.  She had a CT scan followed by an MRI.  Concerning for acute cholecystitis and choledocholithiasis.  Difficulty finding a GI physician at [facility] that was able to do ERCP this week, ultimately transferred to be seen by gastroenterology with Dr. Servando Snare for ERCP.  On arrival patient complaining of severe pain in her right upper abdomen associated with some nausea.  Reviewed MRCP that showed choledocholithiasis with findings concerning for acute cholecystitis.     Physical Exam   Triage Vital Signs: ED Triage Vitals [01/04/24 1039]  Encounter Vitals Group     BP      Systolic BP Percentile      Diastolic BP Percentile      Pulse      Resp      Temp      Temp src      SpO2      Weight      Height      Head Circumference      Peak Flow      Pain Score 10     Pain Loc      Pain Education      Exclude from Growth Chart     Most recent vital signs: Vitals:   01/04/24 1042  BP: 106/74  Pulse: 93  Resp: 18  SpO2: 96%    Physical Exam Constitutional:      Appearance: She is well-developed.  HENT:     Head: Atraumatic.  Eyes:     Conjunctiva/sclera: Conjunctivae normal.  Cardiovascular:     Rate and Rhythm: Regular rhythm.  Pulmonary:     Effort: No respiratory distress.  Abdominal:     General: There is no distension.     Tenderness: There is abdominal tenderness in the right upper quadrant.  Musculoskeletal:        General: Normal range of motion.     Cervical back: Normal range of motion.  Skin:    General: Skin is warm.   Neurological:     Mental Status: She is alert. Mental status is at baseline.     IMPRESSION / MDM / ASSESSMENT AND PLAN / ED COURSE  I reviewed the triage vital signs and the nursing notes.  Differential diagnosis including acute cholecystitis, choledocholithiasis, cholangitis   RADIOLOGY MRCP reviewed positive for choledocholithiasis for a 5-6 mm stone in the distal common bile duct with proximal biliary tract dilation.  Cholelithiasis with evidence of acute cholecystitis.  Also areas suggesting pyelonephritis or developing renal infarcts.  LABS (all labs ordered are listed, but only abnormal results are displayed) Labs interpreted as -    Labs Reviewed  CBC  COMPREHENSIVE METABOLIC PANEL     MDM  On my evaluation patient did have positive nitrites and WBCs on UA.  Elevated LFTs and leukocytosis.  Called and discussed with the pharmacist who stated the patient received IV Rocephin and Flagyl within the past 24 hours.  Flagyl given this morning at 9 AM.  Patient given IV Dilaudid and Zofran for pain control.  Consulted and discussed the patient's case with Dr. Servando Snare, made n.p.o., plan to admit to the hospitalist.     PROCEDURES:  Critical Care performed: No  Procedures  Patient's presentation is most consistent with acute presentation with potential threat to life or bodily function.   MEDICATIONS ORDERED IN ED: Medications  HYDROmorphone (DILAUDID) injection 0.5 mg (has no administration in time range)  ondansetron (ZOFRAN) injection 4 mg (has no administration in time range)  lactated ringers bolus 500 mL (has no administration in time range)    FINAL CLINICAL IMPRESSION(S) / ED DIAGNOSES   Final diagnoses:  Choledocholithiasis with acute cholecystitis     Rx / DC Orders   ED Discharge Orders     None        Note:  This document was prepared using Dragon voice recognition software and may include unintentional dictation errors.   Corena Herter, MD 01/04/24 1053

## 2024-01-04 NOTE — ED Provider Notes (Signed)
1:35 AM Assumed care from Dr. Durwin Nora, please see their note for full history, physical and decision making until this point. In brief this is a 58 y.o. year old female who presented to the ED tonight with Abdominal Pain     S/s c/w cholecystitis, non-diagnostic ct, d/w Dr. Lovell Sheehan with EGS, recommends MRCP as if patient needs ERCP then needs transferred, if just cholecysitits, will see in AM.  Discharge instructions, including strict return precautions for new or worsening symptoms, given. Patient and/or family verbalized understanding and agreement with the plan as described.   Labs, studies and imaging reviewed by myself and considered in medical decision making if ordered. Imaging interpreted by radiology.  Labs Reviewed  COMPREHENSIVE METABOLIC PANEL - Abnormal; Notable for the following components:      Result Value   Sodium 134 (*)    Chloride 91 (*)    Glucose, Bld 312 (*)    AST 346 (*)    ALT 162 (*)    Alkaline Phosphatase 141 (*)    Total Bilirubin 1.8 (*)    All other components within normal limits  URINALYSIS, ROUTINE W REFLEX MICROSCOPIC - Abnormal; Notable for the following components:   Color, Urine AMBER (*)    Glucose, UA 50 (*)    Nitrite POSITIVE (*)    Bacteria, UA MANY (*)    All other components within normal limits  CBC WITH DIFFERENTIAL/PLATELET - Abnormal; Notable for the following components:   WBC 16.5 (*)    Neutro Abs 14.0 (*)    All other components within normal limits  MAGNESIUM - Abnormal; Notable for the following components:   Magnesium 1.6 (*)    All other components within normal limits  LIPASE, BLOOD  BRAIN NATRIURETIC PEPTIDE    CT ABDOMEN PELVIS W CONTRAST  Final Result    MR ABDOMEN MRCP WO CONTRAST    (Results Pending)    No follow-ups on file.

## 2024-01-04 NOTE — H&P (Addendum)
History and Physical    [name]:[id number] DOB: 25-Apr-1966 DOA: 01/04/2024  PCP: Joyce Pacer, NP (Confirm with patient/family/NH records and if not entered, this has to be entered at [facility] point of entry) Patient coming from: Home  I have personally briefly reviewed patient's old medical records in [facility]  Chief Complaint: Belly and flank pain, dysuria, fever, cough and wheezing  HPI: [name] is a 58 y.o. female with medical history significant of COPD, gallstone, cigarette smoking, IIDM, presented with multiple complaints including worsening of COPD with cough wheezing and shortness of breath, and new onset of right upper quadrant abdominal pain and right flank pain and dysuria.  Symptoms started about 7 days ago patient started to have worsening of cough and wheezing, occasionally coughing up yellowish sputum, has had chills but no fever.  3 days ago she started to feel " discomfort" on right upper quadrant abdominal, worsening with eating meals, since yesterday the pain has become constant associated with nausea and vomiting of stomach content denies any diarrhea.  Yesterday she also started to have some cramping " in bladder" and burning sensations when urinate.  Yesterday evening patient went to [facility], workup showed patient had acute cholecystitis and a cholelithiasis and possible right-sided pyelonephritis versus infarct, MRCP confirmed CBD obstruction with stones, and patient sent to [facility] for ERCP.  ED Course: Afebrile, nontachycardic nonhypotensive nonhypoxic.  Blood work showed hemoglobin 13.8, WBC 16.5, glucose 312, creatinine 0.4 AST 346 ALT 162, bilirubin 1.8 alk phos 141. Patient was given IV bolus 1000 and Zofran x 1 and Dilaudid x 1.  Review of Systems: As per HPI otherwise 14 point review of systems negative.    Past Medical History:  Diagnosis Date   Angina    Anxiety state 10/15/2015   ARDS (adult respiratory distress  syndrome) (HCC)    Jan 2011   Asthma    Chronic back pain    COPD (chronic obstructive pulmonary disease) (HCC)    Diabetes mellitus    GERD (gastroesophageal reflux disease) 12/21/2012   HTN (hypertension) 10/15/2015   Hyperglycemia, drug-induced    steroid induced hyperglycemia   Migraine headache    On home O2    Pneumonia    Recurrent upper respiratory infection (URI)    Shortness of breath     Past Surgical History:  Procedure Laterality Date   c-section     TRACHEOSTOMY     decannulated 12/2009   TUBAL LIGATION     uterine ablasion       reports that she has been smoking cigarettes. She has a 21 pack-year smoking history. She has never used smokeless tobacco. She reports that she does not drink alcohol and does not use drugs.  Allergies  Allergen Reactions   Levofloxacin Anaphylaxis   Levofloxacin In D5w Itching, Swelling and Rash    IV only IV only   Penicillins Other (See Comments)    convulsions   Doxycycline Nausea And Vomiting   Morphine Nausea Only    Family History  Problem Relation Age of Onset   Coronary artery disease Brother    Diabetes Other    Cancer Other    Hypertension Other     Prior to Admission medications   Medication Sig Start Date End Date Taking? Authorizing Provider  acetaminophen (TYLENOL) 500 MG tablet Take 500 mg by mouth every 6 (six) hours as needed for fever or mild pain.    [provider]  albuterol (PROVENTIL) (  2.5 MG/3ML) 0.083% nebulizer solution Take 3 mLs (2.5 mg total) by nebulization every 6 (six) hours as needed for wheezing or shortness of breath. 03/13/22   Mariea Clonts, Courage, MD  albuterol (VENTOLIN HFA) 108 (90 Base) MCG/ACT inhaler Inhale 2 puffs into the lungs every 4 (four) hours as needed for wheezing or shortness of breath. 03/13/22   Mariea Clonts, Courage, MD  glipiZIDE (GLUCOTROL) 5 MG tablet Take 2 tablets (10 mg total) by mouth daily before breakfast. 04/23/23   Tat, Onalee Hua, MD  metFORMIN (GLUCOPHAGE) 500 MG  tablet Take 1 tablet (500 mg total) by mouth 2 (two) times daily with a meal. 04/23/23   Tat, Onalee Hua, MD  methocarbamol (ROBAXIN) 500 MG tablet Take 500 mg by mouth every 8 (eight) hours as needed for muscle spasms. 08/13/21   [provider]  ondansetron (ZOFRAN-ODT) 4 MG disintegrating tablet 4mg  ODT q4 hours prn nausea/vomit Patient taking differently: Take 4 mg by mouth every 4 (four) hours as needed for nausea or vomiting. 4mg  ODT q4 hours prn nausea/vomit 04/19/22   Bethann Berkshire, MD  OZEMPIC, 0.25 OR 0.5 MG/DOSE, 2 MG/3ML SOPN Inject 0.5 mg into the skin once a week. Tuesdays. 12/28/23   [provider]  pantoprazole (PROTONIX) 40 MG tablet Take 1 tablet (40 mg total) by mouth daily. 03/13/22   Shon Hale, MD  rOPINIRole (REQUIP) 1 MG tablet Take 1 mg by mouth at bedtime. 03/09/23   [provider]  rosuvastatin (CRESTOR) 40 MG tablet Take 40 mg by mouth daily. 08/17/23   [provider]  SUMAtriptan (IMITREX) 50 MG tablet Take 50 mg by mouth as needed for migraine or headache. 11/29/21   [provider]  SYMBICORT 80-4.5 MCG/ACT inhaler Inhale 2 puffs into the lungs 2 (two) times daily. 12/28/23   [provider]  ARIPiprazole (ABILIFY) 5 MG tablet Take 5 mg by mouth daily.    02/03/12  [provider]  venlafaxine (EFFEXOR) 75 MG tablet Take 75 mg by mouth daily.    02/03/12  [provider]    Physical Exam: Vitals:   01/04/24 1042  BP: 106/74  Pulse: 93  Resp: 18  SpO2: 96%    Constitutional: NAD, calm, comfortable Vitals:   01/04/24 1042  BP: 106/74  Pulse: 93  Resp: 18  SpO2: 96%   Eyes: PERRL, lids and conjunctivae normal ENMT: Mucous membranes are moist. Posterior pharynx clear of any exudate or lesions.Normal dentition.  Neck: normal, supple, no masses, no thyromegaly Respiratory: Diminished breathing sound bilaterally, diffused wheezing, no crackles, increasing breathing respiratory effort. No accessory  muscle use.  Cardiovascular: Regular rate and rhythm, no murmurs / rubs / gallops. No extremity edema. 2+ pedal pulses. No carotid bruits.  Abdomen: Tenderness on RUQ, no rebound no guarding, tenderness on right CVA no masses palpated. No hepatosplenomegaly. Bowel sounds positive.  Musculoskeletal: no clubbing / cyanosis. No joint deformity upper and lower extremities. Good ROM, no contractures. Normal muscle tone.  Skin: no rashes, lesions, ulcers. No induration Neurologic: CN 2-12 grossly intact. Sensation intact, DTR normal. Strength 5/5 in all 4.  Psychiatric: Normal judgment and insight. Alert and oriented x 3. Normal mood.     Labs on Admission: I have personally reviewed following labs and imaging studies  CBC: Recent Labs  Lab 01/03/24 1718 01/04/24 1106  WBC 16.5* 11.1*  NEUTROABS 14.0*  --   HGB 13.8 12.2  HCT 42.6 37.4  MCV 83.9 82.4  PLT 356 301   Basic Metabolic Panel: Recent  Labs  Lab 01/03/24 1718 01/04/24 1106  NA 134* 131*  K 4.0 3.6  CL 91* 90*  CO2 31 29  GLUCOSE 312* 228*  BUN 9 9  CREATININE 0.49 0.46  CALCIUM 9.5 8.2*  MG 1.6*  --    GFR: Estimated Creatinine Clearance: 71.6 mL/min (by C-G formula based on SCr of 0.46 mg/dL). Liver Function Tests: Recent Labs  Lab 01/03/24 1718 01/04/24 1106  AST 346* 378*  ALT 162* 304*  ALKPHOS 141* 176*  BILITOT 1.8* 3.3*  PROT 7.8 6.8  ALBUMIN 3.8 3.1*   Recent Labs  Lab 01/03/24 1718  LIPASE 35   No results for input(s): "AMMONIA" in the last 168 hours. Coagulation Profile: No results for input(s): "INR", "PROTIME" in the last 168 hours. Cardiac Enzymes: No results for input(s): "CKTOTAL", "CKMB", "CKMBINDEX", "TROPONINI" in the last 168 hours. BNP (last 3 results) No results for input(s): "PROBNP" in the last 8760 hours. HbA1C: No results for input(s): "HGBA1C" in the last 72 hours. CBG: No results for input(s): "GLUCAP" in the last 168 hours. Lipid Profile: No results for input(s):  "CHOL", "HDL", "LDLCALC", "TRIG", "CHOLHDL", "LDLDIRECT" in the last 72 hours. Thyroid Function Tests: No results for input(s): "TSH", "T4TOTAL", "FREET4", "T3FREE", "THYROIDAB" in the last 72 hours. Anemia Panel: No results for input(s): "VITAMINB12", "FOLATE", "FERRITIN", "TIBC", "IRON", "RETICCTPCT" in the last 72 hours. Urine analysis:    Component Value Date/Time   COLORURINE AMBER (A) 01/03/2024 1630   APPEARANCEUR CLEAR 01/03/2024 1630   LABSPEC 1.015 01/03/2024 1630   PHURINE 6.0 01/03/2024 1630   GLUCOSEU 50 (A) 01/03/2024 1630   HGBUR NEGATIVE 01/03/2024 1630   BILIRUBINUR NEGATIVE 01/03/2024 1630   KETONESUR NEGATIVE 01/03/2024 1630   PROTEINUR NEGATIVE 01/03/2024 1630   UROBILINOGEN 1.0 07/24/2014 1605   NITRITE POSITIVE (A) 01/03/2024 1630   LEUKOCYTESUR NEGATIVE 01/03/2024 1630    Radiological Exams on Admission: MR ABDOMEN MRCP WO CONTRAST Result Date: 01/04/2024 CLINICAL DATA:  58 year old female with history of cholelithiasis. Generalized abdominal pain. EXAM: MRI ABDOMEN WITHOUT CONTRAST  (INCLUDING MRCP) TECHNIQUE: Multiplanar multisequence MR imaging of the abdomen was performed. Heavily T2-weighted images of the biliary and pancreatic ducts were obtained, and three-dimensional MRCP images were rendered by post processing. COMPARISON:  No prior abdominal MRI. CT of the abdomen and pelvis 01/03/2024. FINDINGS: Comment: Today's study is limited for detection and characterization of visceral and/or vascular lesions by lack of IV gadolinium. Portions of today's examination are also limited by considerable patient respiratory motion. Lower chest: Unremarkable. Hepatobiliary: Heterogeneous loss of signal intensity throughout the hepatic parenchyma during out of phase dual echo images, indicative of a background of hepatic steatosis. No definite suspicious cystic or solid hepatic lesions are confidently identified on today's noncontrast examination. Gallbladder is severely  distended. There are multiple filling defects lying dependently in the neck of the gallbladder, largest of which measures up to 1.9 cm in diameter. Gallbladder wall appears thickened and edematous with trace volume of pericholecystic fluid. In the distal common bile duct shortly before the level of the ampulla (coronal image 79 of series 9) there is a small 5-6 mm filling defects which likely reflect choledocholithiasis. This is associated with proximal biliary tract dilatation, with the common bile duct measuring up to 13 mm in the porta hepatis. There is also mild intrahepatic biliary ductal dilatation. Pancreas: No definite pancreatic mass or peripancreatic fluid collections or inflammatory changes confidently identified on today's noncontrast examination. No pancreatic ductal dilatation. Spleen:  Unremarkable. Adrenals/Urinary Tract: T1  hypointense, T2 hyperintense lesions in the left kidney, incompletely characterized on today's noncontrast CT examination, but likely cysts (no imaging follow-up recommended) measuring up to 1.6 cm in the lower pole. There also some wedge-shaped areas of decreased diffusion in the lower pole of the right kidney and posterolateral interpolar region of the right kidney, suspicious for potential areas of pyelonephritis and/or developing infarcts. No hydroureteronephrosis in the visualized portions of the abdomen. Bilateral adrenal glands are normal in appearance. Stomach/Bowel: Visualized portions are unremarkable. Vascular/Lymphatic: No aneurysm identified in the visualized abdominal vasculature on today's noncontrast examination. Other:  No significant volume of ascites. Musculoskeletal: No aggressive appearing osseous lesions are noted in the visualized portions of the skeleton. IMPRESSION: 1. Study is positive for choledocholithiasis with 5-6 mm stones in the distal common bile duct shortly before the level of the ampulla with proximal biliary tract dilatation. 2. There is also  cholelithiasis with evidence of acute cholecystitis, as above. 3. Patchy areas of diffusion restriction in the right kidney which could suggest areas of pyelonephritis or developing renal infarcts. 4. Hepatic steatosis. Electronically Signed   By: Trudie Reed M.D.   On: 01/04/2024 05:23   CT ABDOMEN PELVIS W CONTRAST Result Date: 01/03/2024 CLINICAL DATA:  Generalized abdominal pain radiating to the back. History of gallstones. EXAM: CT ABDOMEN AND PELVIS WITH CONTRAST TECHNIQUE: Multidetector CT imaging of the abdomen and pelvis was performed using the standard protocol following bolus administration of intravenous contrast. RADIATION DOSE REDUCTION: This exam was performed according to the departmental dose-optimization program which includes automated exposure control, adjustment of the mA and/or kV according to patient size and/or use of iterative reconstruction technique. CONTRAST:  OMNIPAQUE IOHEXOL 300 MG/ML  SOLN COMPARISON:  08/17/2021 FINDINGS: Lower chest: Redemonstration of innumerable tree in bud opacities at the lung bases, right more than left, consistent with chronic inflammatory disease, possibly atypical pneumonia. No dense consolidation or lobar collapse. No effusion. Hepatobiliary: Liver parenchyma is normal. The gallbladder is distended and there is surrounding edema. The patient is known to have gallstones but these are not calcified and not visible by CT. There is intra and extrahepatic ductal dilatation, presumably indicating choledocholithiasis. I do not see a pancreatic head mass. Pancreas: No pancreatic head mass identified. Presumed choledocholithiasis. No sign of pancreatitis. Spleen: Normal Adrenals/Urinary Tract: Adrenal glands are normal. No significant renal finding. Few small cysts. Normal bladder. Stomach/Bowel: Stomach and small intestine are normal. Normal appendix. Normal colon. Vascular/Lymphatic: Aorta and IVC are normal.  No adenopathy. Reproductive: No pelvic  mass. Other: No free fluid or air. Musculoskeletal: Ordinary lumbar degenerative changes. Old minor compression deformity T12. IMPRESSION: 1. Distended gallbladder with surrounding edema. The patient is known to have gallstones but these are not calcified and not visible by CT. Findings quite worrisome for acute cholecystitis and obstruction. There is intra and extrahepatic ductal dilatation, presumably indicating choledocholithiasis responsible for biliary ductal obstruction. 2. Redemonstration of innumerable tree in bud opacities at the lung bases, right more than left, consistent with chronic inflammatory disease, possibly atypical pneumonia. Electronically Signed   By: Paulina Fusi M.D.   On: 01/03/2024 21:30    EKG: Independently reviewed.  Sinus tachycardia, no acute ST changes, chronic RBBB.  Assessment/Plan Principal Problem:   Cholecystitis Active Problems:   COPD with acute exacerbation (HCC)   Choledocholithiasis   Acute pyelonephritis  (please populate well all problems here in Problem List. (For example, if patient is on BP meds at home and you resume or decide to hold them,  it is a problem that needs to be her. Same for CAD, COPD, HLD and so on)  Acute cholecystitis Acute ascending cholangitis -Secondary to obstruction CBD gallstones, blood cultures x 2, start antibiotics ceftriaxone and Flagyl -N.p.o. and emergency ERCP this afternoon -Trend LFTs  Complicated UTI with acute right-sided pyelonephritis -Ceftriaxone -DDx, CT image cannot distinguish pyelonephritis versus infarct, clinically given that patient also has concurrent UTI with symptoms of dysuria, suspicion for kidney infarct is low.  Monitor clinical progress, if right flank pain improving, consider reimage abdomen.  Leukocytosis -Secondary to above, no other signs of SIRS  Acute COPD exacerbation -Solu-Medrol -ICS and LABA -DuoNebs and as needed albuterol -Incentive spirometry and flutter valve -Antibiotics as  above  IIDM with hyperglycemia -Hold off metformin -SSI  HLD -Hold off statin till LFTs coming down.  DVT prophylaxis: SCD Code Status: Full code Family Communication: Son and daughter at bedside Disposition Plan: Patient is sick with acute cholecystitis and acute cholangitis with CBD obstruction requiring emergency ERCP, expect more than 2 midnight hospital stay Consults called: Dr. Servando Snare Admission status: MedSurg admission   Emeline General MD [facility] Pager [phone]  01/04/2024, 11:56 AM

## 2024-01-04 NOTE — Transfer of Care (Signed)
Immediate Anesthesia Transfer of Care Note  Patient: [name]  Procedure(s) Performed: ENDOSCOPIC RETROGRADE CHOLANGIOPANCREATOGRAPHY (ERCP) WITH PROPOFOL  Patient Location: PACU  Anesthesia Type:General  Level of Consciousness: sedated  Airway & Oxygen Therapy: Patient Spontanous Breathing and Patient connected to nasal cannula oxygen  Post-op Assessment: Report given to RN and Post -op Vital signs reviewed and stable  Post vital signs: Reviewed and stable  Last Vitals:  Vitals Value Taken Time  BP 85/58 01/04/24 1410  Temp    Pulse 102 01/04/24 1413  Resp    SpO2 97 % 01/04/24 1413  Vitals shown include unfiled device data.  Last Pain:  Vitals:   01/04/24 1144  PainSc: 10-Worst pain ever         Complications: No notable events documented.

## 2024-01-04 NOTE — ED Notes (Addendum)
100.1 F oral temperature treated with tylenol suppository SEE MAR

## 2024-01-04 NOTE — ED Notes (Signed)
Both sets of blood cultures drawn.

## 2024-01-04 NOTE — Anesthesia Preprocedure Evaluation (Addendum)
Anesthesia Evaluation  Patient identified by MRN, date of birth, ID band Patient awake    Reviewed: Allergy & Precautions, NPO status , Patient's Chart, lab work & pertinent test results  History of Anesthesia Complications Negative for: history of anesthetic complications  Airway Mallampati: III  TM Distance: <3 FB Neck ROM: full    Dental  (+) Chipped   Pulmonary shortness of breath and with exertion, asthma , COPD,  COPD inhaler and oxygen dependent, Recent URI , Current Smoker   Pulmonary exam normal        Cardiovascular hypertension, (-) angina (-) CAD and (-) Past MI Normal cardiovascular exam     Neuro/Psych  Headaches  negative psych ROS   GI/Hepatic negative GI ROS, Neg liver ROS,,,  Endo/Other  diabetes, Type 2    Renal/GU Renal disease  negative genitourinary   Musculoskeletal   Abdominal   Peds  Hematology negative hematology ROS (+)   Anesthesia Other Findings Patient is NPO appropriate and reports no nausea or vomiting today.  Past Medical History: No date: Angina 10/15/2015: Anxiety state No date: ARDS (adult respiratory distress syndrome) (HCC)     Comment:  Jan 2011 No date: Asthma No date: Chronic back pain No date: COPD (chronic obstructive pulmonary disease) (HCC) No date: Diabetes mellitus 12/21/2012: GERD (gastroesophageal reflux disease) 10/15/2015: HTN (hypertension) No date: Hyperglycemia, drug-induced     Comment:  steroid induced hyperglycemia No date: Migraine headache No date: On home O2 No date: Pneumonia No date: Recurrent upper respiratory infection (URI) No date: Shortness of breath  Past Surgical History: No date: c-section No date: TRACHEOSTOMY     Comment:  decannulated 12/2009 No date: TUBAL LIGATION No date: uterine ablasion     Reproductive/Obstetrics negative OB ROS                             Anesthesia Physical Anesthesia  Plan  ASA: 3  Anesthesia Plan: General   Post-op Pain Management:    Induction: Intravenous  PONV Risk Score and Plan: Propofol infusion and TIVA  Airway Management Planned: Natural Airway and Simple Face Mask  Additional Equipment:   Intra-op Plan:   Post-operative Plan:   Informed Consent: I have reviewed the patients History and Physical, chart, labs and discussed the procedure including the risks, benefits and alternatives for the proposed anesthesia with the patient or authorized representative who has indicated his/her understanding and acceptance.     Dental Advisory Given  Plan Discussed with: Anesthesiologist, CRNA and Surgeon  Anesthesia Plan Comments: (Patient consented for risks of anesthesia including but not limited to:  - adverse reactions to medications - risk of airway placement if required - damage to eyes, teeth, lips or other oral mucosa - nerve damage due to positioning  - sore throat or hoarseness - Damage to heart, brain, nerves, lungs, other parts of body or loss of life  Patient voiced understanding and assent.)       Anesthesia Quick Evaluation

## 2024-01-04 NOTE — Op Note (Signed)
[facility] Gastroenterology Patient Name: [name] Procedure Date: 01/04/2024 1:03 PM MRN: [id number] Account #: [id number] Date of Birth: August 18, 1966 Admit Type: Outpatient Age: 58 Room: [facility] ENDO ROOM 4 Gender: Female Note Status: Finalized Instrument Name: Nolon Stalls [id number] Procedure:             ERCP Indications:           Common bile duct stone(s) Providers:             Midge Minium MD, MD Referring MD:          No Local Md, MD (Referring MD) Medicines:             Propofol per Anesthesia Complications:         No immediate complications. Procedure:             Pre-Anesthesia Assessment:                        - Prior to the procedure, a History and Physical was                         performed, and patient medications and allergies were                         reviewed. The patient's tolerance of previous                         anesthesia was also reviewed. The risks and benefits                         of the procedure and the sedation options and risks                         were discussed with the patient. All questions were                         answered, and informed consent was obtained. Prior                         Anticoagulants: The patient has taken no anticoagulant                         or antiplatelet agents. ASA Grade Assessment: II - A                         patient with mild systemic disease. After reviewing                         the risks and benefits, the patient was deemed in                         satisfactory condition to undergo the procedure.                        After obtaining informed consent, the scope was passed                         under direct vision. Throughout the procedure, the  patient's blood pressure, pulse, and oxygen                         saturations were monitored continuously. The                         Duodenoscope was introduced through the mouth, and                          used to inject contrast into and used to cannulate the                         bile duct. The ERCP was accomplished without                         difficulty. The patient tolerated the procedure well. Findings:      The scout film was normal. The esophagus was successfully intubated       under direct vision. The scope was advanced to a normal major papilla in       the descending duodenum without detailed examination of the pharynx,       larynx and associated structures, and upper GI tract. The upper GI tract       was grossly normal. The bile duct was deeply cannulated with the       short-nosed traction sphincterotome. Contrast was injected. I personally       interpreted the bile duct images. There was brisk flow of contrast       through the ducts. Image quality was excellent. Contrast extended to the       entire biliary tree. The lower third of the main bile duct contained       filling defect(s). A wire was passed into the biliary tree. An 8 mm       biliary sphincterotomy was made with a traction (standard)       sphincterotome using ERBE electrocautery. There was no       post-sphincterotomy bleeding. The biliary tree was swept with a 15 mm       balloon starting at the bifurcation. A few stones were removed. No       stones remained. Impression:            - A filling defect was seen on the cholangiogram.                        - Choledocholithiasis was found. Complete removal was                         accomplished by biliary sphincterotomy and balloon                         extraction.                        - A biliary sphincterotomy was performed.                        - The biliary tree was swept. Recommendation:        - Return patient to hospital ward for ongoing care.                        -  Clear liquid diet.                        - Watch for pancreatitis, bleeding, perforation, and                         cholangitis.                        - Continue present  medications. Procedure Code(s):     --- Professional ---                        [phone], Endoscopic retrograde cholangiopancreatography                         (ERCP); with removal of calculi/debris from                         biliary/pancreatic duct(s)                        [id number], Endoscopic retrograde cholangiopancreatography                         (ERCP); with sphincterotomy/papillotomy                        [phone], Endoscopic catheterization of the biliary                         ductal system, radiological supervision and                         interpretation Diagnosis Code(s):     --- Professional ---                        K80.50, Calculus of bile duct without cholangitis or                         cholecystitis without obstruction CPT copyright 2022 [employer]. All rights reserved. The codes documented in this report are preliminary and upon coder review may  be revised to meet current compliance requirements. Midge Minium MD, MD 01/04/2024 2:05:44 PM This report has been signed electronically. Number of Addenda: 0 Note Initiated On: 01/04/2024 1:03 PM Estimated Blood Loss:  Estimated blood loss: none.      [facility]

## 2024-01-04 NOTE — Inpatient Diabetes Management (Signed)
Inpatient Diabetes Program Recommendations  AACE/ADA: New Consensus Statement on Inpatient Glycemic Control  Target Ranges:  Prepandial:   less than 140 mg/dL      Peak postprandial:   less than 180 mg/dL (1-2 hours)      Critically ill patients:  140 - 180 mg/dL    Latest Reference Range & Units 01/03/24 17:18  Glucose 70 - 99 mg/dL 409 (H)   Review of Glycemic Control  Diabetes history: DM2 Outpatient Diabetes medications: Glipizide 10 mg QAM, Metformin 500 mg BID, Ozempic 0.5 mg Qweek (Tuesday) Current orders for Inpatient glycemic control: None; in [facility] ED  Inpatient Diabetes Program Recommendations:    Insulin: Lab glucose 312 mg/dl on 07/03/90 at 47:82. Please consider ordering CBGs Q4H with Novolog 0-15 units Q4H.  Thanks, Orlando Penner, RN, MSN, CDCES Diabetes Coordinator Inpatient Diabetes Program [phone] (Team Pager from 8am to 5pm)

## 2024-01-05 ENCOUNTER — Encounter: Payer: Self-pay | Admitting: Gastroenterology

## 2024-01-05 DIAGNOSIS — K8062 Calculus of gallbladder and bile duct with acute cholecystitis without obstruction: Secondary | ICD-10-CM | POA: Diagnosis not present

## 2024-01-05 DIAGNOSIS — R109 Unspecified abdominal pain: Secondary | ICD-10-CM | POA: Diagnosis not present

## 2024-01-05 DIAGNOSIS — J441 Chronic obstructive pulmonary disease with (acute) exacerbation: Secondary | ICD-10-CM | POA: Diagnosis not present

## 2024-01-05 DIAGNOSIS — K8042 Calculus of bile duct with acute cholecystitis without obstruction: Secondary | ICD-10-CM | POA: Diagnosis not present

## 2024-01-05 DIAGNOSIS — K8050 Calculus of bile duct without cholangitis or cholecystitis without obstruction: Secondary | ICD-10-CM | POA: Diagnosis not present

## 2024-01-05 DIAGNOSIS — N10 Acute tubulo-interstitial nephritis: Secondary | ICD-10-CM

## 2024-01-05 LAB — GLUCOSE, CAPILLARY
Glucose-Capillary: 201 mg/dL — ABNORMAL HIGH (ref 70–99)
Glucose-Capillary: 203 mg/dL — ABNORMAL HIGH (ref 70–99)
Glucose-Capillary: 221 mg/dL — ABNORMAL HIGH (ref 70–99)
Glucose-Capillary: 306 mg/dL — ABNORMAL HIGH (ref 70–99)

## 2024-01-05 MED ORDER — METHOCARBAMOL 1000 MG/10ML IJ SOLN
500.0000 mg | Freq: Three times a day (TID) | INTRAMUSCULAR | Status: DC | PRN
Start: 2024-01-05 — End: 2024-01-07

## 2024-01-05 MED ORDER — ACETAMINOPHEN 650 MG RE SUPP
650.0000 mg | Freq: Four times a day (QID) | RECTAL | Status: DC | PRN
Start: 2024-01-05 — End: 2024-01-07

## 2024-01-05 MED ORDER — ACETAMINOPHEN 500 MG PO TABS: 500.0000 mg | ORAL_TABLET | Freq: Four times a day (QID) | ORAL | Status: DC | PRN

## 2024-01-05 MED ORDER — ROPINIROLE HCL 1 MG PO TABS
1.0000 mg | ORAL_TABLET | Freq: Three times a day (TID) | ORAL | Status: DC
  Administered 2024-01-05 – 2024-01-07 (×5): 1 mg via ORAL
  Filled 2024-01-05 (×5): qty 1

## 2024-01-05 MED ORDER — METHOCARBAMOL 500 MG PO TABS
500.0000 mg | ORAL_TABLET | Freq: Three times a day (TID) | ORAL | Status: DC | PRN
  Administered 2024-01-05 – 2024-01-06 (×2): 500 mg via ORAL
  Filled 2024-01-05 (×3): qty 1

## 2024-01-05 MED ORDER — METHYLPREDNISOLONE SODIUM SUCC 40 MG IJ SOLR
40.0000 mg | INTRAMUSCULAR | Status: DC
  Administered 2024-01-06: 40 mg via INTRAVENOUS
  Filled 2024-01-05: qty 1

## 2024-01-05 NOTE — Anesthesia Postprocedure Evaluation (Signed)
Anesthesia Post Note  Patient: [name]  Procedure(s) Performed: ENDOSCOPIC RETROGRADE CHOLANGIOPANCREATOGRAPHY (ERCP) WITH PROPOFOL REMOVAL OF STONES BILIARY DILATION  Patient location during evaluation: Endoscopy Anesthesia Type: General Level of consciousness: awake and alert Pain management: pain level controlled Vital Signs Assessment: post-procedure vital signs reviewed and stable Respiratory status: spontaneous breathing, nonlabored ventilation, respiratory function stable and patient connected to nasal cannula oxygen Cardiovascular status: blood pressure returned to baseline and stable Postop Assessment: no apparent nausea or vomiting Anesthetic complications: no   No notable events documented.   Last Vitals:  Vitals:   01/04/24 2134 01/05/24 0205  BP: 124/87   Pulse: 86   Resp: 19   Temp: 36.9 C   SpO2: 100% 98%    Last Pain:  Vitals:   01/05/24 0650  TempSrc:   PainSc: 10-Worst pain ever                 Louie Boston

## 2024-01-05 NOTE — Progress Notes (Signed)
CC: cholecystitis Subjective: Doing well tolerated cld Family at bedside d/w them in detail about plan  Objective: Vital signs in last 24 hours: Temp:  [97.1 F (36.2 C)-98.5 F (36.9 C)] 98.5 F (36.9 C) (02/11 2134) Pulse Rate:  [74-97] 97 (02/12 0801) Resp:  [16-19] 19 (02/12 0801) BP: (85-135)/(58-87) 135/80 (02/12 0801) SpO2:  [96 %-100 %] 98 % (02/12 0823) Last BM Date : 01/03/24  Intake/Output from previous day: 02/11 0701 - 02/12 0700 In: 1000 [I.V.:1000] Out: 0  Intake/Output this shift: No intake/output data recorded.  Physical exam:  NAd alert Abd: soft, mild TTP RUQ unchanged, no peritonitis Ext: well perfused  Lab Results: CBC  Recent Labs    01/03/24 1718 01/04/24 1106  WBC 16.5* 11.1*  HGB 13.8 12.2  HCT 42.6 37.4  PLT 356 301   BMET Recent Labs    01/03/24 1718 01/04/24 1106  NA 134* 131*  K 4.0 3.6  CL 91* 90*  CO2 31 29  GLUCOSE 312* 228*  BUN 9 9  CREATININE 0.49 0.46  CALCIUM 9.5 8.2*   PT/INR No results for input(s): "LABPROT", "INR" in the last 72 hours. ABG No results for input(s): "PHART", "HCO3" in the last 72 hours.  Invalid input(s): "PCO2", "PO2"  Studies/Results: DG Chest 1 View Result Date: 01/04/2024 CLINICAL DATA:  COPD, recent ERCP EXAM: CHEST  1 VIEW COMPARISON:  04/20/2023 FINDINGS: Single frontal view of the chest demonstrates a stable cardiac silhouette. Background emphysema, with chronic areas of scarring again noted. Linear consolidation at the right lung base likely reflects subsegmental atelectasis or postinflammatory scarring. No airspace disease, effusion, or pneumothorax. No acute bony abnormalities. IMPRESSION: 1. Emphysema.  No acute intrathoracic process. Electronically Signed   By: Sharlet Salina M.D.   On: 01/04/2024 17:32   DG C-Arm 1-60 Min-No Report Result Date: 01/04/2024 Fluoroscopy was utilized by the requesting physician.  No radiographic interpretation.   MR ABDOMEN MRCP WO CONTRAST Result  Date: 01/04/2024 CLINICAL DATA:  58 year old female with history of cholelithiasis. Generalized abdominal pain. EXAM: MRI ABDOMEN WITHOUT CONTRAST  (INCLUDING MRCP) TECHNIQUE: Multiplanar multisequence MR imaging of the abdomen was performed. Heavily T2-weighted images of the biliary and pancreatic ducts were obtained, and three-dimensional MRCP images were rendered by post processing. COMPARISON:  No prior abdominal MRI. CT of the abdomen and pelvis 01/03/2024. FINDINGS: Comment: Today's study is limited for detection and characterization of visceral and/or vascular lesions by lack of IV gadolinium. Portions of today's examination are also limited by considerable patient respiratory motion. Lower chest: Unremarkable. Hepatobiliary: Heterogeneous loss of signal intensity throughout the hepatic parenchyma during out of phase dual echo images, indicative of a background of hepatic steatosis. No definite suspicious cystic or solid hepatic lesions are confidently identified on today's noncontrast examination. Gallbladder is severely distended. There are multiple filling defects lying dependently in the neck of the gallbladder, largest of which measures up to 1.9 cm in diameter. Gallbladder wall appears thickened and edematous with trace volume of pericholecystic fluid. In the distal common bile duct shortly before the level of the ampulla (coronal image 79 of series 9) there is a small 5-6 mm filling defects which likely reflect choledocholithiasis. This is associated with proximal biliary tract dilatation, with the common bile duct measuring up to 13 mm in the porta hepatis. There is also mild intrahepatic biliary ductal dilatation. Pancreas: No definite pancreatic mass or peripancreatic fluid collections or inflammatory changes confidently identified on today's noncontrast examination. No pancreatic ductal dilatation. Spleen:  Unremarkable.  Adrenals/Urinary Tract: T1 hypointense, T2 hyperintense lesions in the left  kidney, incompletely characterized on today's noncontrast CT examination, but likely cysts (no imaging follow-up recommended) measuring up to 1.6 cm in the lower pole. There also some wedge-shaped areas of decreased diffusion in the lower pole of the right kidney and posterolateral interpolar region of the right kidney, suspicious for potential areas of pyelonephritis and/or developing infarcts. No hydroureteronephrosis in the visualized portions of the abdomen. Bilateral adrenal glands are normal in appearance. Stomach/Bowel: Visualized portions are unremarkable. Vascular/Lymphatic: No aneurysm identified in the visualized abdominal vasculature on today's noncontrast examination. Other:  No significant volume of ascites. Musculoskeletal: No aggressive appearing osseous lesions are noted in the visualized portions of the skeleton. IMPRESSION: 1. Study is positive for choledocholithiasis with 5-6 mm stones in the distal common bile duct shortly before the level of the ampulla with proximal biliary tract dilatation. 2. There is also cholelithiasis with evidence of acute cholecystitis, as above. 3. Patchy areas of diffusion restriction in the right kidney which could suggest areas of pyelonephritis or developing renal infarcts. 4. Hepatic steatosis. Electronically Signed   By: Trudie Reed M.D.   On: 01/04/2024 05:23   CT ABDOMEN PELVIS W CONTRAST Result Date: 01/03/2024 CLINICAL DATA:  Generalized abdominal pain radiating to the back. History of gallstones. EXAM: CT ABDOMEN AND PELVIS WITH CONTRAST TECHNIQUE: Multidetector CT imaging of the abdomen and pelvis was performed using the standard protocol following bolus administration of intravenous contrast. RADIATION DOSE REDUCTION: This exam was performed according to the departmental dose-optimization program which includes automated exposure control, adjustment of the mA and/or kV according to patient size and/or use of iterative reconstruction technique.  CONTRAST:  OMNIPAQUE IOHEXOL 300 MG/ML  SOLN COMPARISON:  08/17/2021 FINDINGS: Lower chest: Redemonstration of innumerable tree in bud opacities at the lung bases, right more than left, consistent with chronic inflammatory disease, possibly atypical pneumonia. No dense consolidation or lobar collapse. No effusion. Hepatobiliary: Liver parenchyma is normal. The gallbladder is distended and there is surrounding edema. The patient is known to have gallstones but these are not calcified and not visible by CT. There is intra and extrahepatic ductal dilatation, presumably indicating choledocholithiasis. I do not see a pancreatic head mass. Pancreas: No pancreatic head mass identified. Presumed choledocholithiasis. No sign of pancreatitis. Spleen: Normal Adrenals/Urinary Tract: Adrenal glands are normal. No significant renal finding. Few small cysts. Normal bladder. Stomach/Bowel: Stomach and small intestine are normal. Normal appendix. Normal colon. Vascular/Lymphatic: Aorta and IVC are normal.  No adenopathy. Reproductive: No pelvic mass. Other: No free fluid or air. Musculoskeletal: Ordinary lumbar degenerative changes. Old minor compression deformity T12. IMPRESSION: 1. Distended gallbladder with surrounding edema. The patient is known to have gallstones but these are not calcified and not visible by CT. Findings quite worrisome for acute cholecystitis and obstruction. There is intra and extrahepatic ductal dilatation, presumably indicating choledocholithiasis responsible for biliary ductal obstruction. 2. Redemonstration of innumerable tree in bud opacities at the lung bases, right more than left, consistent with chronic inflammatory disease, possibly atypical pneumonia. Electronically Signed   By: Paulina Fusi M.D.   On: 01/03/2024 21:30    Anti-infectives: Anti-infectives (From admission, onward)    Start     Dose/Rate Route Frequency Ordered Stop   01/04/24 1200  cefTRIAXone (ROCEPHIN) 2 g in sodium  chloride 0.9 % 100 mL IVPB        2 g 200 mL/hr over 30 Minutes Intravenous Every 24 hours 01/04/24 1142  01/04/24 1200  metroNIDAZOLE (FLAGYL) IVPB 500 mg        500 mg 100 mL/hr over 60 Minutes Intravenous Every 12 hours 01/04/24 1142         Assessment/Plan: 58 year old female with signs consistent with acute cholecystitis as well as choledocholithiasis. She already had her ERCP and does not seem to have complications related to this, I do think it is safe to proceed with cholecystectomy.  She is currently not toxic.   For rob chole in am. We again d/w the pt in detail about Risks, benefits and possible complications. [facility] understands and agrees   Sterling Big, MD, [facility]  01/05/2024

## 2024-01-05 NOTE — Inpatient Diabetes Management (Signed)
Inpatient Diabetes Program Recommendations  AACE/ADA: New Consensus Statement on Inpatient Glycemic Control (2015)  Target Ranges:  Prepandial:   less than 140 mg/dL      Peak postprandial:   less than 180 mg/dL (1-2 hours)      Critically ill patients:  140 - 180 mg/dL   Lab Results  Component Value Date   GLUCAP 201 (H) 01/05/2024   HGBA1C 10.9 (H) 01/04/2024    Latest Reference Range & Units 04/20/23 14:46 01/04/24 16:49  Hemoglobin A1C 4.8 - 5.6 % 12.0 (H) 10.9 (H)  (H): Data is abnormally high  Diabetes history: DM2 Outpatient Diabetes medications: Glipizide 10 mg QAM, Metformin 500 mg BID, Ozempic 0.5 mg Qweek (Tuesday) Current orders for Inpatient glycemic control: None; in [facility] ED  Inpatient Diabetes Program Recommendations:   Noted A1c 10.9 which is down from 04/20/23 of 12.0. Will follow during hospitalization.  Thank you, Billy Fischer. Lafe Clerk, RN, MSN, CDCES  Diabetes Coordinator Inpatient Glycemic Control Team Team Pager [phone] (8am-5pm) 01/05/2024 11:50 AM

## 2024-01-05 NOTE — Progress Notes (Signed)
1      PROGRESS NOTE    [name]  ZOX:[id number] DOB: 03-16-1966 DOA: 01/04/2024 PCP: Kara Pacer, NP    Brief Narrative:   58 y.o. female with medical history significant of COPD, gallstone, cigarette smoking, type 2 diabetes is admitted for acute cholecystitis and choledocholithiasis  2/11: GI and surgery consult, status post ERCP  2/12: Adjusted Requip dose per patient request.  Surgery planned for tomorrow  Assessment & Plan:   Principal Problem:   Cholecystitis Active Problems:   COPD with acute exacerbation (HCC)   Choledocholithiasis with acute cholecystitis   Acute pyelonephritis   Choledocholithiasis  Acute cholecystitis Acute ascending cholangitis -Secondary to obstruction CBD gallstones, blood cultures x 2, continue ceftriaxone and Flagyl -Status post ERCP - biliary sphincterotomy, balloon extraction of stones -Lap chole scheduled for tomorrow   Complicated UTI with acute right-sided pyelonephritis -Continue ceftriaxone -Await urine culture   Leukocytosis -Secondary to above, no other signs of SIRS   Acute COPD exacerbation -Taper IV Solu-Medrol dose -Continue ICS and LABA -DuoNebs and as needed albuterol -Incentive spirometry and flutter valve -Antibiotics as above   IIDM with hyperglycemia -Hold off metformin -SSI for now   HLD -Hold off statin till LFTs coming down.   DVT prophylaxis: (SCDs SCDs Start: 01/04/24 1610     Code Status: Full code Family Communication: None at bedside Disposition Plan: Possible discharge in next 2 to 3 days depending on postop recovery and clinical condition   Consultants:  GI Surgery  Procedures:  ERCP  Antimicrobials:  Rocephin Flagyl   Subjective:  She is requesting to increase her Requip dose per home regimen.  Her pain gets worse when she is clear liquid so cutting back on that and adjusting pain medicine per her request  Objective: Vitals:   01/05/24 0801 01/05/24 0823  01/05/24 1334 01/05/24 1649  BP: 135/80   125/70  Pulse: 97   93  Resp: 19   18  Temp:    98.3 F (36.8 C)  TempSrc:    Oral  SpO2: 96% 98% 95%     Intake/Output Summary (Last 24 hours) at 01/05/2024 1711 Last data filed at 01/05/2024 1052 Gross per 24 hour  Intake 360 ml  Output --  Net 360 ml   There were no vitals filed for this visit.  Examination:  General exam: Appears calm and comfortable  Respiratory system: Clear to auscultation. Respiratory effort normal. Cardiovascular system: S1 & S2 heard, RRR. No JVD, murmurs. No pedal edema. Gastrointestinal system: Abdomen is soft, benign Central nervous system: Alert and oriented. No focal neurological deficits. Extremities: Symmetric 5 x 5 power. Skin: No rashes, lesions or ulcers Psychiatry: Judgement and insight appear normal. Mood & affect appropriate.     Data Reviewed: I have personally reviewed following labs and imaging studies  CBC: Recent Labs  Lab 01/03/24 1718 01/04/24 1106  WBC 16.5* 11.1*  NEUTROABS 14.0*  --   HGB 13.8 12.2  HCT 42.6 37.4  MCV 83.9 82.4  PLT 356 301   Basic Metabolic Panel: Recent Labs  Lab 01/03/24 1718 01/04/24 1106  NA 134* 131*  K 4.0 3.6  CL 91* 90*  CO2 31 29  GLUCOSE 312* 228*  BUN 9 9  CREATININE 0.49 0.46  CALCIUM 9.5 8.2*  MG 1.6*  --    GFR: Estimated Creatinine Clearance: 71.6 mL/min (by C-G formula based on SCr of 0.46 mg/dL). Liver Function Tests: Recent Labs  Lab 01/03/24 1718 01/04/24 1106  AST 346* 378*  ALT 162* 304*  ALKPHOS 141* 176*  BILITOT 1.8* 3.3*  PROT 7.8 6.8  ALBUMIN 3.8 3.1*   Recent Labs  Lab 01/03/24 1718  LIPASE 35    HbA1C: Recent Labs    01/04/24 1649  HGBA1C 10.9*   CBG: Recent Labs  Lab 01/04/24 1634 01/04/24 2135 01/05/24 0814 01/05/24 1202 01/05/24 1646  GLUCAP 167* 229* 201* 203* 221*    Sepsis Labs: Recent Labs  Lab 01/04/24 0925  LATICACIDVEN 0.7    Recent Results (from the past 240 hours)   Blood culture (routine x 2)     Status: None (Preliminary result)   Collection Time: 01/04/24  9:25 AM   Specimen: BLOOD  Result Value Ref Range Status   Specimen Description BLOOD BLOOD RIGHT ARM ac  Final   Special Requests   Final    BOTTLES DRAWN AEROBIC AND ANAEROBIC Blood Culture results may not be optimal due to an inadequate volume of blood received in culture bottles   Culture   Final    NO GROWTH < 24 HOURS Performed at [facility], [address] [place], [place] [address]    Report Status PENDING  Incomplete  Blood culture (routine x 2)     Status: None (Preliminary result)   Collection Time: 01/04/24  9:25 AM   Specimen: BLOOD  Result Value Ref Range Status   Specimen Description BLOOD BLOOD LEFT ARM ac  Final   Special Requests   Final    Blood Culture results may not be optimal due to an inadequate volume of blood received in culture bottles BOTTLES DRAWN AEROBIC AND ANAEROBIC   Culture   Final    NO GROWTH < 24 HOURS Performed at [facility], [address], [place], [place] [address]    Report Status PENDING  Incomplete  Culture, blood (Routine X 2) w Reflex to ID Panel     Status: None (Preliminary result)   Collection Time: 01/04/24  4:49 PM   Specimen: BLOOD  Result Value Ref Range Status   Specimen Description BLOOD BLOOD RIGHT HAND  Final   Special Requests   Final    BOTTLES DRAWN AEROBIC ONLY Blood Culture results may not be optimal due to an inadequate volume of blood received in culture bottles   Culture   Final    NO GROWTH < 24 HOURS Performed at [facility], [address] [place], [place] [address]    Report Status PENDING  Incomplete  Culture, blood (Routine X 2) w Reflex to ID Panel     Status: None (Preliminary result)   Collection Time: 01/04/24  4:50 PM   Specimen: BLOOD  Result Value Ref Range Status   Specimen Description BLOOD BLOOD RIGHT ARM  Final   Special Requests   Final    BOTTLES DRAWN AEROBIC ONLY Blood Culture  results may not be optimal due to an inadequate volume of blood received in culture bottles   Culture   Final    NO GROWTH < 24 HOURS Performed at [facility], [address] [place], [place] [address]    Report Status PENDING  Incomplete         Radiology Studies: DG Chest 1 View Result Date: 01/04/2024 CLINICAL DATA:  COPD, recent ERCP EXAM: CHEST  1 VIEW COMPARISON:  04/20/2023 FINDINGS: Single frontal view of the chest demonstrates a stable cardiac silhouette. Background emphysema, with chronic areas of scarring again noted. Linear consolidation at the right lung  base likely reflects subsegmental atelectasis or postinflammatory scarring. No airspace disease, effusion, or pneumothorax. No acute bony abnormalities. IMPRESSION: 1. Emphysema.  No acute intrathoracic process. Electronically Signed   By: Sharlet Salina M.D.   On: 01/04/2024 17:32   DG C-Arm 1-60 Min-No Report Result Date: 01/04/2024 Fluoroscopy was utilized by the requesting physician.  No radiographic interpretation.   MR 3D Recon At Scanner Result Date: 01/04/2024 CLINICAL DATA:  58 year old female with history of cholelithiasis. Generalized abdominal pain. EXAM: MRI ABDOMEN WITHOUT CONTRAST  (INCLUDING MRCP) TECHNIQUE: Multiplanar multisequence MR imaging of the abdomen was performed. Heavily T2-weighted images of the biliary and pancreatic ducts were obtained, and three-dimensional MRCP images were rendered by post processing. COMPARISON:  No prior abdominal MRI. CT of the abdomen and pelvis 01/03/2024. FINDINGS: Comment: Today's study is limited for detection and characterization of visceral and/or vascular lesions by lack of IV gadolinium. Portions of today's examination are also limited by considerable patient respiratory motion. Lower chest: Unremarkable. Hepatobiliary: Heterogeneous loss of signal intensity throughout the hepatic parenchyma during out of phase dual echo images, indicative of a background of  hepatic steatosis. No definite suspicious cystic or solid hepatic lesions are confidently identified on today's noncontrast examination. Gallbladder is severely distended. There are multiple filling defects lying dependently in the neck of the gallbladder, largest of which measures up to 1.9 cm in diameter. Gallbladder wall appears thickened and edematous with trace volume of pericholecystic fluid. In the distal common bile duct shortly before the level of the ampulla (coronal image 79 of series 9) there is a small 5-6 mm filling defects which likely reflect choledocholithiasis. This is associated with proximal biliary tract dilatation, with the common bile duct measuring up to 13 mm in the porta hepatis. There is also mild intrahepatic biliary ductal dilatation. Pancreas: No definite pancreatic mass or peripancreatic fluid collections or inflammatory changes confidently identified on today's noncontrast examination. No pancreatic ductal dilatation. Spleen:  Unremarkable. Adrenals/Urinary Tract: T1 hypointense, T2 hyperintense lesions in the left kidney, incompletely characterized on today's noncontrast CT examination, but likely cysts (no imaging follow-up recommended) measuring up to 1.6 cm in the lower pole. There also some wedge-shaped areas of decreased diffusion in the lower pole of the right kidney and posterolateral interpolar region of the right kidney, suspicious for potential areas of pyelonephritis and/or developing infarcts. No hydroureteronephrosis in the visualized portions of the abdomen. Bilateral adrenal glands are normal in appearance. Stomach/Bowel: Visualized portions are unremarkable. Vascular/Lymphatic: No aneurysm identified in the visualized abdominal vasculature on today's noncontrast examination. Other:  No significant volume of ascites. Musculoskeletal: No aggressive appearing osseous lesions are noted in the visualized portions of the skeleton. IMPRESSION: 1. Study is positive for  choledocholithiasis with 5-6 mm stones in the distal common bile duct shortly before the level of the ampulla with proximal biliary tract dilatation. 2. There is also cholelithiasis with evidence of acute cholecystitis, as above. 3. Patchy areas of diffusion restriction in the right kidney which could suggest areas of pyelonephritis or developing renal infarcts. 4. Hepatic steatosis. Electronically Signed   By: Trudie Reed M.D.   On: 01/04/2024 11:28   MR ABDOMEN MRCP WO CONTRAST Result Date: 01/04/2024 CLINICAL DATA:  58 year old female with history of cholelithiasis. Generalized abdominal pain. EXAM: MRI ABDOMEN WITHOUT CONTRAST  (INCLUDING MRCP) TECHNIQUE: Multiplanar multisequence MR imaging of the abdomen was performed. Heavily T2-weighted images of the biliary and pancreatic ducts were obtained, and three-dimensional MRCP images were rendered by post processing. COMPARISON:  No prior  abdominal MRI. CT of the abdomen and pelvis 01/03/2024. FINDINGS: Comment: Today's study is limited for detection and characterization of visceral and/or vascular lesions by lack of IV gadolinium. Portions of today's examination are also limited by considerable patient respiratory motion. Lower chest: Unremarkable. Hepatobiliary: Heterogeneous loss of signal intensity throughout the hepatic parenchyma during out of phase dual echo images, indicative of a background of hepatic steatosis. No definite suspicious cystic or solid hepatic lesions are confidently identified on today's noncontrast examination. Gallbladder is severely distended. There are multiple filling defects lying dependently in the neck of the gallbladder, largest of which measures up to 1.9 cm in diameter. Gallbladder wall appears thickened and edematous with trace volume of pericholecystic fluid. In the distal common bile duct shortly before the level of the ampulla (coronal image 79 of series 9) there is a small 5-6 mm filling defects which likely reflect  choledocholithiasis. This is associated with proximal biliary tract dilatation, with the common bile duct measuring up to 13 mm in the porta hepatis. There is also mild intrahepatic biliary ductal dilatation. Pancreas: No definite pancreatic mass or peripancreatic fluid collections or inflammatory changes confidently identified on today's noncontrast examination. No pancreatic ductal dilatation. Spleen:  Unremarkable. Adrenals/Urinary Tract: T1 hypointense, T2 hyperintense lesions in the left kidney, incompletely characterized on today's noncontrast CT examination, but likely cysts (no imaging follow-up recommended) measuring up to 1.6 cm in the lower pole. There also some wedge-shaped areas of decreased diffusion in the lower pole of the right kidney and posterolateral interpolar region of the right kidney, suspicious for potential areas of pyelonephritis and/or developing infarcts. No hydroureteronephrosis in the visualized portions of the abdomen. Bilateral adrenal glands are normal in appearance. Stomach/Bowel: Visualized portions are unremarkable. Vascular/Lymphatic: No aneurysm identified in the visualized abdominal vasculature on today's noncontrast examination. Other:  No significant volume of ascites. Musculoskeletal: No aggressive appearing osseous lesions are noted in the visualized portions of the skeleton. IMPRESSION: 1. Study is positive for choledocholithiasis with 5-6 mm stones in the distal common bile duct shortly before the level of the ampulla with proximal biliary tract dilatation. 2. There is also cholelithiasis with evidence of acute cholecystitis, as above. 3. Patchy areas of diffusion restriction in the right kidney which could suggest areas of pyelonephritis or developing renal infarcts. 4. Hepatic steatosis. Electronically Signed   By: Trudie Reed M.D.   On: 01/04/2024 05:23   CT ABDOMEN PELVIS W CONTRAST Result Date: 01/03/2024 CLINICAL DATA:  Generalized abdominal pain radiating  to the back. History of gallstones. EXAM: CT ABDOMEN AND PELVIS WITH CONTRAST TECHNIQUE: Multidetector CT imaging of the abdomen and pelvis was performed using the standard protocol following bolus administration of intravenous contrast. RADIATION DOSE REDUCTION: This exam was performed according to the departmental dose-optimization program which includes automated exposure control, adjustment of the mA and/or kV according to patient size and/or use of iterative reconstruction technique. CONTRAST:  OMNIPAQUE IOHEXOL 300 MG/ML  SOLN COMPARISON:  08/17/2021 FINDINGS: Lower chest: Redemonstration of innumerable tree in bud opacities at the lung bases, right more than left, consistent with chronic inflammatory disease, possibly atypical pneumonia. No dense consolidation or lobar collapse. No effusion. Hepatobiliary: Liver parenchyma is normal. The gallbladder is distended and there is surrounding edema. The patient is known to have gallstones but these are not calcified and not visible by CT. There is intra and extrahepatic ductal dilatation, presumably indicating choledocholithiasis. I do not see a pancreatic head mass. Pancreas: No pancreatic head mass identified. Presumed choledocholithiasis. No  sign of pancreatitis. Spleen: Normal Adrenals/Urinary Tract: Adrenal glands are normal. No significant renal finding. Few small cysts. Normal bladder. Stomach/Bowel: Stomach and small intestine are normal. Normal appendix. Normal colon. Vascular/Lymphatic: Aorta and IVC are normal.  No adenopathy. Reproductive: No pelvic mass. Other: No free fluid or air. Musculoskeletal: Ordinary lumbar degenerative changes. Old minor compression deformity T12. IMPRESSION: 1. Distended gallbladder with surrounding edema. The patient is known to have gallstones but these are not calcified and not visible by CT. Findings quite worrisome for acute cholecystitis and obstruction. There is intra and extrahepatic ductal dilatation,  presumably indicating choledocholithiasis responsible for biliary ductal obstruction. 2. Redemonstration of innumerable tree in bud opacities at the lung bases, right more than left, consistent with chronic inflammatory disease, possibly atypical pneumonia. Electronically Signed   By: Paulina Fusi M.D.   On: 01/03/2024 21:30        Scheduled Meds:  budesonide (PULMICORT) nebulizer solution  0.25 mg Nebulization BID   diclofenac  100 mg Rectal Once   [START ON 01/06/2024] indocyanine green  1.25 mg Intravenous Once   insulin aspart  0-15 Units Subcutaneous TID WC   ipratropium-albuterol  3 mL Nebulization Q6H   methylPREDNISolone (SOLU-MEDROL) injection  80 mg Intravenous Q24H   mometasone-formoterol  2 puff Inhalation BID   pantoprazole  40 mg Oral Daily   rOPINIRole  1 mg Oral TID   Continuous Infusions:  cefTRIAXone (ROCEPHIN)  IV 2 g (01/05/24 1115)   metronidazole 500 mg (01/05/24 1248)     LOS: 1 day    Time spent: 35 minutes    Taeshawn Helfman Sherryll Burger, MD [facility] Pager [phone]  If 7PM-7AM, please contact night-coverage [email or web address]  01/05/2024, 5:11 PM

## 2024-01-05 NOTE — Progress Notes (Signed)
Joyce Minium, MD [facility]   [address], [address] [place], [place] [address] Phone: [phone] Fax : [phone]   Subjective: The patient reported doing well until she had some chicken broth and now she has some tightness in her abdomen.  The patient has not had any nausea vomiting and has been otherwise doing well.   Objective: Vital signs in last 24 hours: Vitals:   01/05/24 0205 01/05/24 0801 01/05/24 0823 01/05/24 1334  BP:  135/80    Pulse:  97    Resp:  19    Temp:      TempSrc:      SpO2: 98% 96% 98% 95%   Weight change:   Intake/Output Summary (Last 24 hours) at 01/05/2024 1346 Last data filed at 01/05/2024 1052 Gross per 24 hour  Intake 1360 ml  Output --  Net 1360 ml     Exam: Heart:: Regular rate and rhythm or without murmur or extra heart sounds Lungs: normal and clear to auscultation and percussion Abdomen: soft, nontender, normal bowel sounds   Lab Results: @LABTEST2 @ Micro Results: Recent Results (from the past 240 hours)  Blood culture (routine x 2)     Status: None (Preliminary result)   Collection Time: 01/04/24  9:25 AM   Specimen: BLOOD  Result Value Ref Range Status   Specimen Description BLOOD BLOOD RIGHT ARM ac  Final   Special Requests   Final    BOTTLES DRAWN AEROBIC AND ANAEROBIC Blood Culture results may not be optimal due to an inadequate volume of blood received in culture bottles   Culture   Final    NO GROWTH < 24 HOURS Performed at [facility], [address], [place], [place] [address]    Report Status PENDING  Incomplete  Blood culture (routine x 2)     Status: None (Preliminary result)   Collection Time: 01/04/24  9:25 AM   Specimen: BLOOD  Result Value Ref Range Status   Specimen Description BLOOD BLOOD LEFT ARM ac  Final   Special Requests   Final    Blood Culture results may not be optimal due to an inadequate volume of blood received in culture bottles BOTTLES DRAWN AEROBIC AND ANAEROBIC   Culture   Final    NO  GROWTH < 24 HOURS Performed at [facility], [address], [place], [place] [address]    Report Status PENDING  Incomplete  Culture, blood (Routine X 2) w Reflex to ID Panel     Status: None (Preliminary result)   Collection Time: 01/04/24  4:49 PM   Specimen: BLOOD  Result Value Ref Range Status   Specimen Description BLOOD BLOOD RIGHT HAND  Final   Special Requests   Final    BOTTLES DRAWN AEROBIC ONLY Blood Culture results may not be optimal due to an inadequate volume of blood received in culture bottles   Culture   Final    NO GROWTH < 24 HOURS Performed at [facility], [address] [place], [place] [address]    Report Status PENDING  Incomplete  Culture, blood (Routine X 2) w Reflex to ID Panel     Status: None (Preliminary result)   Collection Time: 01/04/24  4:50 PM   Specimen: BLOOD  Result Value Ref Range Status   Specimen Description BLOOD BLOOD RIGHT ARM  Final   Special Requests   Final    BOTTLES DRAWN AEROBIC ONLY Blood Culture results may not be optimal due to an inadequate volume  of blood received in culture bottles   Culture   Final    NO GROWTH < 24 HOURS Performed at [facility], [address] [place], [place] [address]    Report Status PENDING  Incomplete   Studies/Results: DG Chest 1 View Result Date: 01/04/2024 CLINICAL DATA:  COPD, recent ERCP EXAM: CHEST  1 VIEW COMPARISON:  04/20/2023 FINDINGS: Single frontal view of the chest demonstrates a stable cardiac silhouette. Background emphysema, with chronic areas of scarring again noted. Linear consolidation at the right lung base likely reflects subsegmental atelectasis or postinflammatory scarring. No airspace disease, effusion, or pneumothorax. No acute bony abnormalities. IMPRESSION: 1. Emphysema.  No acute intrathoracic process. Electronically Signed   By: Sharlet Salina M.D.   On: 01/04/2024 17:32   DG C-Arm 1-60 Min-No Report Result Date: 01/04/2024 Fluoroscopy was utilized by  the requesting physician.  No radiographic interpretation.   MR ABDOMEN MRCP WO CONTRAST Result Date: 01/04/2024 CLINICAL DATA:  58 year old female with history of cholelithiasis. Generalized abdominal pain. EXAM: MRI ABDOMEN WITHOUT CONTRAST  (INCLUDING MRCP) TECHNIQUE: Multiplanar multisequence MR imaging of the abdomen was performed. Heavily T2-weighted images of the biliary and pancreatic ducts were obtained, and three-dimensional MRCP images were rendered by post processing. COMPARISON:  No prior abdominal MRI. CT of the abdomen and pelvis 01/03/2024. FINDINGS: Comment: Today's study is limited for detection and characterization of visceral and/or vascular lesions by lack of IV gadolinium. Portions of today's examination are also limited by considerable patient respiratory motion. Lower chest: Unremarkable. Hepatobiliary: Heterogeneous loss of signal intensity throughout the hepatic parenchyma during out of phase dual echo images, indicative of a background of hepatic steatosis. No definite suspicious cystic or solid hepatic lesions are confidently identified on today's noncontrast examination. Gallbladder is severely distended. There are multiple filling defects lying dependently in the neck of the gallbladder, largest of which measures up to 1.9 cm in diameter. Gallbladder wall appears thickened and edematous with trace volume of pericholecystic fluid. In the distal common bile duct shortly before the level of the ampulla (coronal image 79 of series 9) there is a small 5-6 mm filling defects which likely reflect choledocholithiasis. This is associated with proximal biliary tract dilatation, with the common bile duct measuring up to 13 mm in the porta hepatis. There is also mild intrahepatic biliary ductal dilatation. Pancreas: No definite pancreatic mass or peripancreatic fluid collections or inflammatory changes confidently identified on today's noncontrast examination. No pancreatic ductal dilatation.  Spleen:  Unremarkable. Adrenals/Urinary Tract: T1 hypointense, T2 hyperintense lesions in the left kidney, incompletely characterized on today's noncontrast CT examination, but likely cysts (no imaging follow-up recommended) measuring up to 1.6 cm in the lower pole. There also some wedge-shaped areas of decreased diffusion in the lower pole of the right kidney and posterolateral interpolar region of the right kidney, suspicious for potential areas of pyelonephritis and/or developing infarcts. No hydroureteronephrosis in the visualized portions of the abdomen. Bilateral adrenal glands are normal in appearance. Stomach/Bowel: Visualized portions are unremarkable. Vascular/Lymphatic: No aneurysm identified in the visualized abdominal vasculature on today's noncontrast examination. Other:  No significant volume of ascites. Musculoskeletal: No aggressive appearing osseous lesions are noted in the visualized portions of the skeleton. IMPRESSION: 1. Study is positive for choledocholithiasis with 5-6 mm stones in the distal common bile duct shortly before the level of the ampulla with proximal biliary tract dilatation. 2. There is also cholelithiasis with evidence of acute cholecystitis, as above. 3. Patchy areas of diffusion restriction in the right kidney  which could suggest areas of pyelonephritis or developing renal infarcts. 4. Hepatic steatosis. Electronically Signed   By: Trudie Reed M.D.   On: 01/04/2024 05:23   CT ABDOMEN PELVIS W CONTRAST Result Date: 01/03/2024 CLINICAL DATA:  Generalized abdominal pain radiating to the back. History of gallstones. EXAM: CT ABDOMEN AND PELVIS WITH CONTRAST TECHNIQUE: Multidetector CT imaging of the abdomen and pelvis was performed using the standard protocol following bolus administration of intravenous contrast. RADIATION DOSE REDUCTION: This exam was performed according to the departmental dose-optimization program which includes automated exposure control, adjustment  of the mA and/or kV according to patient size and/or use of iterative reconstruction technique. CONTRAST:  OMNIPAQUE IOHEXOL 300 MG/ML  SOLN COMPARISON:  08/17/2021 FINDINGS: Lower chest: Redemonstration of innumerable tree in bud opacities at the lung bases, right more than left, consistent with chronic inflammatory disease, possibly atypical pneumonia. No dense consolidation or lobar collapse. No effusion. Hepatobiliary: Liver parenchyma is normal. The gallbladder is distended and there is surrounding edema. The patient is known to have gallstones but these are not calcified and not visible by CT. There is intra and extrahepatic ductal dilatation, presumably indicating choledocholithiasis. I do not see a pancreatic head mass. Pancreas: No pancreatic head mass identified. Presumed choledocholithiasis. No sign of pancreatitis. Spleen: Normal Adrenals/Urinary Tract: Adrenal glands are normal. No significant renal finding. Few small cysts. Normal bladder. Stomach/Bowel: Stomach and small intestine are normal. Normal appendix. Normal colon. Vascular/Lymphatic: Aorta and IVC are normal.  No adenopathy. Reproductive: No pelvic mass. Other: No free fluid or air. Musculoskeletal: Ordinary lumbar degenerative changes. Old minor compression deformity T12. IMPRESSION: 1. Distended gallbladder with surrounding edema. The patient is known to have gallstones but these are not calcified and not visible by CT. Findings quite worrisome for acute cholecystitis and obstruction. There is intra and extrahepatic ductal dilatation, presumably indicating choledocholithiasis responsible for biliary ductal obstruction. 2. Redemonstration of innumerable tree in bud opacities at the lung bases, right more than left, consistent with chronic inflammatory disease, possibly atypical pneumonia. Electronically Signed   By: Paulina Fusi M.D.   On: 01/03/2024 21:30   Medications: I have reviewed the patient's current medications. Scheduled  Meds:  budesonide (PULMICORT) nebulizer solution  0.25 mg Nebulization BID   diclofenac  100 mg Rectal Once   [START ON 01/06/2024] indocyanine green  1.25 mg Intravenous Once   insulin aspart  0-15 Units Subcutaneous TID WC   ipratropium-albuterol  3 mL Nebulization Q6H   methylPREDNISolone (SOLU-MEDROL) injection  80 mg Intravenous Q24H   mometasone-formoterol  2 puff Inhalation BID   pantoprazole  40 mg Oral Daily   rOPINIRole  1 mg Oral TID   Continuous Infusions:  sodium chloride 100 mL/hr at 01/05/24 0425   cefTRIAXone (ROCEPHIN)  IV 2 g (01/05/24 1115)   metronidazole 500 mg (01/05/24 1248)   PRN Meds:.acetaminophen, albuterol, HYDROmorphone (DILAUDID) injection, methocarbamol, ondansetron **OR** ondansetron (ZOFRAN) IV, SUMAtriptan   Assessment: Principal Problem:   Cholecystitis Active Problems:   COPD with acute exacerbation (HCC)   Choledocholithiasis with acute cholecystitis   Acute pyelonephritis    Plan: The patient is doing well after her ERCP with stone extraction and stent durotomy.  The patient has some tightness in her abdomen which could be either postprocedural gas versus mild pancreatitis.  The patient does not appear to be significantly ill from her discomfort and is supposed to have her gallbladder out tomorrow.  Nothing further to do from a GI point of view at this time.  I will sign off.  Please call if any further GI concerns or questions.  We would like to thank you for the opportunity to participate in the care of [name].    LOS: 1 day   Joyce Minium, MD.FACG 01/05/2024, 1:46 PM Pager [phone] 7am-5pm  Check AMION for 5pm -7am coverage and on weekends

## 2024-01-06 ENCOUNTER — Encounter: Payer: Self-pay | Admitting: Internal Medicine

## 2024-01-06 ENCOUNTER — Inpatient Hospital Stay: Payer: Medicaid Other | Admitting: Anesthesiology

## 2024-01-06 ENCOUNTER — Other Ambulatory Visit: Payer: Self-pay

## 2024-01-06 ENCOUNTER — Encounter: Admission: EM | Disposition: A | Discharge status: Home / Self Care | Payer: Self-pay | Attending: Internal Medicine

## 2024-01-06 DIAGNOSIS — K8042 Calculus of bile duct with acute cholecystitis without obstruction: Secondary | ICD-10-CM | POA: Diagnosis not present

## 2024-01-06 DIAGNOSIS — K8062 Calculus of gallbladder and bile duct with acute cholecystitis without obstruction: Secondary | ICD-10-CM | POA: Diagnosis not present

## 2024-01-06 DIAGNOSIS — N10 Acute tubulo-interstitial nephritis: Secondary | ICD-10-CM | POA: Diagnosis not present

## 2024-01-06 LAB — GLUCOSE, CAPILLARY
Glucose-Capillary: 173 mg/dL — ABNORMAL HIGH (ref 70–99)
Glucose-Capillary: 170 mg/dL — ABNORMAL HIGH (ref 70–99)
Glucose-Capillary: 268 mg/dL — ABNORMAL HIGH (ref 70–99)
Glucose-Capillary: 472 mg/dL — ABNORMAL HIGH (ref 70–99)
Glucose-Capillary: 326 mg/dL — ABNORMAL HIGH (ref 70–99)

## 2024-01-06 LAB — COMPREHENSIVE METABOLIC PANEL
ALT: 228 U/L — ABNORMAL HIGH (ref 0–44)
AST: 143 U/L — ABNORMAL HIGH (ref 15–41)
Albumin: 3.1 g/dL — ABNORMAL LOW (ref 3.5–5.0)
Alkaline Phosphatase: 193 U/L — ABNORMAL HIGH (ref 38–126)
Anion gap: 8 (ref 5–15)
BUN: 10 mg/dL (ref 6–20)
CO2: 31 mmol/L (ref 22–32)
Calcium: 8.9 mg/dL (ref 8.9–10.3)
Chloride: 99 mmol/L (ref 98–111)
Creatinine, Ser: 0.48 mg/dL (ref 0.44–1.00)
GFR, Estimated: >60 mL/min (ref >60)
Glucose, Bld: 183 mg/dL — ABNORMAL HIGH (ref 70–99)
Potassium: 4.4 mmol/L (ref 3.5–5.1)
Sodium: 138 mmol/L (ref 135–145)
Total Bilirubin: 1.8 mg/dL — ABNORMAL HIGH (ref 0.0–1.2)
Total Protein: 6.7 g/dL (ref 6.5–8.1)

## 2024-01-06 LAB — CBC
HCT: 35.2 % — ABNORMAL LOW (ref 36.0–46.0)
Hemoglobin: 11.1 g/dL — ABNORMAL LOW (ref 12.0–15.0)
MCH: 26.6 pg (ref 26.0–34.0)
MCHC: 31.5 g/dL (ref 30.0–36.0)
MCV: 84.2 fL (ref 80.0–100.0)
Platelets: 297 10*3/uL (ref 150–400)
RBC: 4.18 MIL/uL (ref 3.87–5.11)
RDW: 12.2 % (ref 11.5–15.5)
WBC: 10.2 10*3/uL (ref 4.0–10.5)
nRBC: 0 % (ref 0.0–0.2)

## 2024-01-06 SURGERY — CHOLECYSTECTOMY, ROBOT-ASSISTED, LAPAROSCOPIC
Anesthesia: General | Site: Abdomen

## 2024-01-06 MED ORDER — ACETAMINOPHEN 10 MG/ML IV SOLN
INTRAVENOUS | Status: DC | PRN
  Administered 2024-01-06: 1000 mg via INTRAVENOUS

## 2024-01-06 MED ORDER — SUGAMMADEX SODIUM 500 MG/5ML IV SOLN
INTRAVENOUS | Status: DC | PRN
  Administered 2024-01-06: 200 mg via INTRAVENOUS

## 2024-01-06 MED ORDER — FENTANYL CITRATE (PF) 100 MCG/2ML IJ SOLN
INTRAMUSCULAR | Status: AC
  Filled 2024-01-06: qty 2

## 2024-01-06 MED ORDER — OXYCODONE HCL 5 MG/5ML PO SOLN: 5.0000 mg | Freq: Once | ORAL | Status: AC | PRN

## 2024-01-06 MED ORDER — BUPIVACAINE-EPINEPHRINE (PF) 0.25% -1:200000 IJ SOLN
INTRAMUSCULAR | Status: DC | PRN
  Administered 2024-01-06: 30 mL

## 2024-01-06 MED ORDER — CLONAZEPAM 1 MG PO TABS
1.0000 mg | ORAL_TABLET | Freq: Two times a day (BID) | ORAL | Status: DC
Start: 2024-01-06 — End: 2024-01-07
  Administered 2024-01-06 – 2024-01-07 (×3): 1 mg via ORAL
  Filled 2024-01-06 (×3): qty 1

## 2024-01-06 MED ORDER — INSULIN ASPART 100 UNIT/ML IJ SOLN
0.0000 [IU] | Freq: Every day | INTRAMUSCULAR | Status: DC
  Administered 2024-01-06: 4 [IU] via SUBCUTANEOUS
  Filled 2024-01-06: qty 1

## 2024-01-06 MED ORDER — KETAMINE HCL 50 MG/5ML IJ SOSY
PREFILLED_SYRINGE | INTRAMUSCULAR | Status: AC
  Filled 2024-01-06: qty 5

## 2024-01-06 MED ORDER — PHENYLEPHRINE HCL-NACL 20-0.9 MG/250ML-% IV SOLN
INTRAVENOUS | Status: AC
  Filled 2024-01-06: qty 250

## 2024-01-06 MED ORDER — LACTATED RINGERS IV SOLN: INTRAVENOUS | Status: DC | PRN

## 2024-01-06 MED ORDER — CEFAZOLIN SODIUM-DEXTROSE 2-3 GM-%(50ML) IV SOLR
INTRAVENOUS | Status: DC | PRN
  Administered 2024-01-06: 2 g via INTRAVENOUS

## 2024-01-06 MED ORDER — INSULIN GLARGINE-YFGN 100 UNIT/ML ~~LOC~~ SOLN
5.0000 [IU] | Freq: Every day | SUBCUTANEOUS | Status: DC
  Administered 2024-01-06: 5 [IU] via SUBCUTANEOUS
  Filled 2024-01-06 (×2): qty 0.05

## 2024-01-06 MED ORDER — IBUPROFEN 400 MG PO TABS
800.0000 mg | ORAL_TABLET | Freq: Three times a day (TID) | ORAL | Status: DC
  Administered 2024-01-06 – 2024-01-07 (×3): 800 mg via ORAL
  Filled 2024-01-06 (×3): qty 2

## 2024-01-06 MED ORDER — KETAMINE HCL 50 MG/5ML IJ SOSY
PREFILLED_SYRINGE | INTRAMUSCULAR | Status: DC | PRN
  Administered 2024-01-06: 20 mg via INTRAVENOUS

## 2024-01-06 MED ORDER — DEXAMETHASONE SODIUM PHOSPHATE 10 MG/ML IJ SOLN
INTRAMUSCULAR | Status: DC | PRN
  Administered 2024-01-06: 4 mg via INTRAVENOUS

## 2024-01-06 MED ORDER — HYDROMORPHONE HCL 1 MG/ML IJ SOLN: 0.5000 mg | Freq: Once | INTRAMUSCULAR | Status: DC

## 2024-01-06 MED ORDER — ROCURONIUM BROMIDE 100 MG/10ML IV SOLN
INTRAVENOUS | Status: DC | PRN
  Administered 2024-01-06: 50 mg via INTRAVENOUS

## 2024-01-06 MED ORDER — BUPIVACAINE LIPOSOME 1.3 % IJ SUSP
INTRAMUSCULAR | Status: AC
  Filled 2024-01-06: qty 20

## 2024-01-06 MED ORDER — FENTANYL CITRATE (PF) 100 MCG/2ML IJ SOLN
25.0000 ug | INTRAMUSCULAR | Status: DC | PRN
  Administered 2024-01-06 (×4): 25 ug via INTRAVENOUS

## 2024-01-06 MED ORDER — INSULIN ASPART 100 UNIT/ML IJ SOLN
0.0000 [IU] | Freq: Three times a day (TID) | INTRAMUSCULAR | Status: DC
  Administered 2024-01-06: 15 [IU] via SUBCUTANEOUS
  Administered 2024-01-07: 5 [IU] via SUBCUTANEOUS
  Administered 2024-01-07: 15 [IU] via SUBCUTANEOUS
  Filled 2024-01-06 (×3): qty 1

## 2024-01-06 MED ORDER — INSULIN ASPART 100 UNIT/ML IJ SOLN: 0.0000 [IU] | Freq: Every day | INTRAMUSCULAR | Status: DC

## 2024-01-06 MED ORDER — MIDAZOLAM HCL 2 MG/2ML IJ SOLN
INTRAMUSCULAR | Status: AC
  Filled 2024-01-06: qty 2

## 2024-01-06 MED ORDER — INSULIN ASPART 100 UNIT/ML IJ SOLN
10.0000 [IU] | Freq: Once | INTRAMUSCULAR | Status: AC
  Administered 2024-01-06: 10 [IU] via SUBCUTANEOUS
  Filled 2024-01-06: qty 1

## 2024-01-06 MED ORDER — DEXMEDETOMIDINE HCL IN NACL 200 MCG/50ML IV SOLN
INTRAVENOUS | Status: DC | PRN
  Administered 2024-01-06: 4 ug via INTRAVENOUS
  Administered 2024-01-06: 12 ug via INTRAVENOUS

## 2024-01-06 MED ORDER — FENTANYL CITRATE (PF) 100 MCG/2ML IJ SOLN
INTRAMUSCULAR | Status: DC | PRN
  Administered 2024-01-06 (×2): 50 ug via INTRAVENOUS
  Administered 2024-01-06: 75 ug via INTRAVENOUS

## 2024-01-06 MED ORDER — OXYCODONE HCL 5 MG PO TABS
ORAL_TABLET | ORAL | Status: AC
  Filled 2024-01-06: qty 1

## 2024-01-06 MED ORDER — PROPOFOL 10 MG/ML IV BOLUS
INTRAVENOUS | Status: AC
  Filled 2024-01-06: qty 20

## 2024-01-06 MED ORDER — LABETALOL HCL 5 MG/ML IV SOLN
INTRAVENOUS | Status: AC
  Filled 2024-01-06: qty 4

## 2024-01-06 MED ORDER — MIDAZOLAM HCL 2 MG/2ML IJ SOLN
INTRAMUSCULAR | Status: DC | PRN
  Administered 2024-01-06: 2 mg via INTRAVENOUS

## 2024-01-06 MED ORDER — OXYCODONE HCL 5 MG PO TABS
5.0000 mg | ORAL_TABLET | ORAL | Status: DC | PRN
  Administered 2024-01-06 – 2024-01-07 (×5): 5 mg via ORAL
  Filled 2024-01-06 (×5): qty 1

## 2024-01-06 MED ORDER — BUPIVACAINE LIPOSOME 1.3 % IJ SUSP
INTRAMUSCULAR | Status: DC | PRN
  Administered 2024-01-06: 20 mL

## 2024-01-06 MED ORDER — LIDOCAINE HCL (CARDIAC) PF 100 MG/5ML IV SOSY
PREFILLED_SYRINGE | INTRAVENOUS | Status: DC | PRN
  Administered 2024-01-06: 60 mg via INTRAVENOUS

## 2024-01-06 MED ORDER — OXYCODONE HCL 5 MG PO TABS
5.0000 mg | ORAL_TABLET | Freq: Once | ORAL | Status: AC | PRN
  Administered 2024-01-06: 5 mg via ORAL

## 2024-01-06 MED ORDER — PROPOFOL 10 MG/ML IV BOLUS
INTRAVENOUS | Status: DC | PRN
  Administered 2024-01-06: 120 mg via INTRAVENOUS
  Administered 2024-01-06: 50 mg via INTRAVENOUS

## 2024-01-06 MED ORDER — ALBUTEROL SULFATE HFA 108 (90 BASE) MCG/ACT IN AERS
INHALATION_SPRAY | RESPIRATORY_TRACT | Status: DC | PRN
  Administered 2024-01-06: 6 via RESPIRATORY_TRACT

## 2024-01-06 MED ORDER — BUPIVACAINE-EPINEPHRINE (PF) 0.25% -1:200000 IJ SOLN
INTRAMUSCULAR | Status: AC
  Filled 2024-01-06: qty 30

## 2024-01-06 MED ORDER — INSULIN ASPART 100 UNIT/ML IJ SOLN: 0.0000 [IU] | Freq: Three times a day (TID) | INTRAMUSCULAR | Status: DC

## 2024-01-06 MED ORDER — SODIUM CHLORIDE 0.9 % IR SOLN
Status: DC | PRN
  Administered 2024-01-06: 1

## 2024-01-06 SURGICAL SUPPLY — 35 items
CANNULA REDUCER 12-8 DVNC XI (CANNULA) ×1 IMPLANT
CAUTERY HOOK MNPLR 1.6 DVNC XI (INSTRUMENTS) ×1 IMPLANT
CLIP LIGATING HEMO O LOK GREEN (MISCELLANEOUS) ×1 IMPLANT
DERMABOND ADVANCED .7 DNX12 (GAUZE/BANDAGES/DRESSINGS) ×1 IMPLANT
DRAPE ARM DVNC X/XI (DISPOSABLE) ×4 IMPLANT
DRAPE COLUMN DVNC XI (DISPOSABLE) ×1 IMPLANT
ELECT REM PT RETURN 9FT ADLT (ELECTROSURGICAL) ×1 IMPLANT
ELECTRODE REM PT RTRN 9FT ADLT (ELECTROSURGICAL) ×1 IMPLANT
FORCEPS BPLR R/ABLATION 8 DVNC (INSTRUMENTS) ×1 IMPLANT
FORCEPS PROGRASP DVNC XI (FORCEP) ×1 IMPLANT
GLOVE BIO SURGEON STRL SZ7 (GLOVE) ×2 IMPLANT
GOWN STRL REUS W/ TWL LRG LVL3 (GOWN DISPOSABLE) ×4 IMPLANT
IRRIGATION STRYKERFLOW (MISCELLANEOUS) IMPLANT
IRRIGATOR STRYKERFLOW (MISCELLANEOUS) ×1 IMPLANT
IV NS 1000ML BAXH (IV SOLUTION) IMPLANT
KIT PINK PAD W/HEAD ARE REST (MISCELLANEOUS) ×1 IMPLANT
KIT PINK PAD W/HEAD ARM REST (MISCELLANEOUS) ×1 IMPLANT
LABEL OR SOLS (LABEL) ×1 IMPLANT
MANIFOLD NEPTUNE II (INSTRUMENTS) ×1 IMPLANT
NDL HYPO 22X1.5 SAFETY MO (MISCELLANEOUS) ×1 IMPLANT
NEEDLE HYPO 22X1.5 SAFETY MO (MISCELLANEOUS) ×1 IMPLANT
NS IRRIG 500ML POUR BTL (IV SOLUTION) ×1 IMPLANT
OBTURATOR OPTICAL STND 8 DVNC (TROCAR) ×1 IMPLANT
OBTURATOR OPTICALSTD 8 DVNC (TROCAR) ×1 IMPLANT
PACK LAP CHOLECYSTECTOMY (MISCELLANEOUS) ×1 IMPLANT
SEAL UNIV 5-12 XI (MISCELLANEOUS) ×4 IMPLANT
SOL ELECTROSURG ANTI STICK (MISCELLANEOUS) ×1 IMPLANT
SOLUTION ELECTROSURG ANTI STCK (MISCELLANEOUS) ×1 IMPLANT
SPIKE FLUID TRANSFER (MISCELLANEOUS) ×1 IMPLANT
SPONGE T-LAP 18X18 ~~LOC~~+RFID (SPONGE) ×1 IMPLANT
SUT MNCRL AB 4-0 PS2 18 (SUTURE) ×1 IMPLANT
SUT VICRYL 0 UR6 27IN ABS (SUTURE) ×2 IMPLANT
SYR 20ML LL LF (SYRINGE) IMPLANT
SYS BAG RETRIEVAL 10MM (BASKET) ×1 IMPLANT
SYSTEM BAG RETRIEVAL 10MM (BASKET) ×1 IMPLANT

## 2024-01-06 NOTE — Anesthesia Procedure Notes (Signed)
Procedure Name: Intubation Date/Time: 01/06/2024 7:49 PM  Performed by: Darrell Jewel I, CRNAPre-anesthesia Checklist: Patient identified, Emergency Drugs available, Suction available, Patient being monitored and Timeout performed Patient Re-evaluated:Patient Re-evaluated prior to induction Oxygen Delivery Method: Circle system utilized Preoxygenation: Pre-oxygenation with 100% oxygen Induction Type: IV induction Ventilation: Mask ventilation without difficulty Laryngoscope Size: 3 and McGrath Grade View: Grade I Tube type: Oral Tube size: 6.0 mm Number of attempts: 1 Airway Equipment and Method: Stylet Placement Confirmation: ETT inserted through vocal cords under direct vision, positive ETCO2 and breath sounds checked- equal and bilateral Secured at: 21 cm Tube secured with: Tape Dental Injury: Teeth and Oropharynx as per pre-operative assessment

## 2024-01-06 NOTE — Anesthesia Preprocedure Evaluation (Signed)
Anesthesia Evaluation  Patient identified by MRN, date of birth, ID band Patient awake    Reviewed: Allergy & Precautions, NPO status , Patient's Chart, lab work & pertinent test results  History of Anesthesia Complications Negative for: history of anesthetic complications  Airway Mallampati: I  TM Distance: >3 FB Neck ROM: full    Dental  (+) Edentulous Upper, Edentulous Lower   Pulmonary shortness of breath, asthma , COPD,  oxygen dependent, Recent URI , Residual Cough, Current Smoker   Pulmonary exam normal        Cardiovascular hypertension, On Medications (-) angina negative cardio ROS Normal cardiovascular exam     Neuro/Psych  Headaches PSYCHIATRIC DISORDERS Anxiety        GI/Hepatic Neg liver ROS,GERD  ,,  Endo/Other  negative endocrine ROSdiabetes, Type 2    Renal/GU Renal disease     Musculoskeletal   Abdominal   Peds  Hematology negative hematology ROS (+)   Anesthesia Other Findings Past Medical History: No date: Angina 10/15/2015: Anxiety state No date: ARDS (adult respiratory distress syndrome) (HCC)     Comment:  Jan 2011 No date: Asthma No date: Chronic back pain No date: COPD (chronic obstructive pulmonary disease) (HCC) No date: Diabetes mellitus No date: Gallstones with biliary obstruction 12/21/2012: GERD (gastroesophageal reflux disease) 10/15/2015: HTN (hypertension) No date: Hyperglycemia, drug-induced     Comment:  steroid induced hyperglycemia No date: Migraine headache No date: On home O2 No date: Pneumonia No date: Recurrent upper respiratory infection (URI) No date: Shortness of breath  Past Surgical History: 01/04/2024: BILIARY DILATION     Comment:  Procedure: BILIARY DILATION;  Surgeon: Midge Minium, MD;              Location: [facility] ENDOSCOPY;  Service: Endoscopy;; No date: c-section 01/04/2024: ENDOSCOPIC RETROGRADE CHOLANGIOPANCREATOGRAPHY (ERCP) WITH  PROPOFOL; N/A      Comment:  Procedure: ENDOSCOPIC RETROGRADE               CHOLANGIOPANCREATOGRAPHY (ERCP) WITH PROPOFOL;  Surgeon:               Midge Minium, MD;  Location: [facility] ENDOSCOPY;  Service:               Endoscopy;  Laterality: N/A; 01/04/2024: REMOVAL OF STONES     Comment:  Procedure: REMOVAL OF STONES;  Surgeon: Midge Minium,               MD;  Location: [facility] ENDOSCOPY;  Service: Endoscopy;; No date: TRACHEOSTOMY     Comment:  decannulated 12/2009 No date: TUBAL LIGATION No date: uterine ablasion     Reproductive/Obstetrics negative OB ROS                             Anesthesia Physical Anesthesia Plan  ASA: 3  Anesthesia Plan: General ETT   Post-op Pain Management: Ofirmev IV (intra-op)*, Toradol IV (intra-op)* and Dilaudid IV   Induction: Intravenous  PONV Risk Score and Plan: 3 and Ondansetron, Dexamethasone, Midazolam and Treatment may vary due to age or medical condition  Airway Management Planned: Oral ETT  Additional Equipment:   Intra-op Plan:   Post-operative Plan: Extubation in OR  Informed Consent: I have reviewed the patients History and Physical, chart, labs and discussed the procedure including the risks, benefits and alternatives for the proposed anesthesia with the patient or authorized representative who has indicated his/her understanding and acceptance.     Dental Advisory Given  Plan Discussed with: Anesthesiologist, CRNA and Surgeon  Anesthesia Plan Comments: (Patient consented for risks of anesthesia including but not limited to:  - adverse reactions to medications - damage to eyes, teeth, lips or other oral mucosa - nerve damage due to positioning  - sore throat or hoarseness - Damage to heart, brain, nerves, lungs, other parts of body or loss of life  Patient voiced understanding and assent.)        Anesthesia Quick Evaluation

## 2024-01-06 NOTE — Anesthesia Postprocedure Evaluation (Signed)
Anesthesia Post Note  Patient: [name]  Procedure(s) Performed: XI ROBOTIC ASSISTED LAPAROSCOPIC CHOLECYSTECTOMY (Abdomen) INDOCYANINE GREEN FLUORESCENCE IMAGING (ICG) (Abdomen)  Patient location during evaluation: PACU Anesthesia Type: General Level of consciousness: awake and alert Pain management: pain level controlled Vital Signs Assessment: post-procedure vital signs reviewed and stable Respiratory status: spontaneous breathing, nonlabored ventilation, respiratory function stable and patient connected to nasal cannula oxygen Cardiovascular status: blood pressure returned to baseline and stable Postop Assessment: no apparent nausea or vomiting Anesthetic complications: no   No notable events documented.   Last Vitals:  Vitals:   01/06/24 1200 01/06/24 1215  BP: 136/84 (!) 148/113  Pulse: 92 90  Resp: 17 18  Temp:  36.6 C  SpO2:  95%    Last Pain:  Vitals:   01/06/24 1215  TempSrc:   PainSc: 10-Worst pain ever                 Louie Boston

## 2024-01-06 NOTE — Progress Notes (Signed)
Preoperative Review   Patient is met in the preoperative holding area. The history is reviewed in the chart and with the patient. I personally reviewed the options and rationale as well as the risks of this procedure that have been previously discussed with the patient. All questions asked by the patient and/or family were answered to their satisfaction.  Patient agrees to proceed with this procedure at this time.  Sterling Big M.D. FACS

## 2024-01-06 NOTE — Transfer of Care (Signed)
Immediate Anesthesia Transfer of Care Note  Patient: [name]  Procedure(s) Performed: XI ROBOTIC ASSISTED LAPAROSCOPIC CHOLECYSTECTOMY (Abdomen) INDOCYANINE GREEN FLUORESCENCE IMAGING (ICG) (Abdomen)  Patient Location: PACU  Anesthesia Type:General  Level of Consciousness: awake and alert   Airway & Oxygen Therapy: Patient Spontanous Breathing and Patient connected to face mask oxygen  Post-op Assessment: Report given to RN and Post -op Vital signs reviewed and stable  Post vital signs: stable  Last Vitals:  Vitals Value Taken Time  BP 125/82 01/06/24 1145  Temp    Pulse 99 01/06/24 1150  Resp 17 01/06/24 1150  SpO2    Vitals shown include unfiled device data.  Last Pain:  Vitals:   01/06/24 0843  TempSrc:   PainSc: 0-No pain         Complications: No notable events documented.

## 2024-01-06 NOTE — Progress Notes (Signed)
1      PROGRESS NOTE    [name]  QQV:[id number] DOB: [birth year] DOA: 01/04/2024 PCP: Kara Pacer, NP    Brief Narrative:   58 y.o. female with medical history significant of COPD, gallstone, cigarette smoking, type 2 diabetes is admitted for acute cholecystitis and choledocholithiasis  2/11: GI and surgery consult, status post ERCP  2/12: Adjusted Requip dose per patient request.   2/13: robotic assisted Lap Chole   Assessment & Plan:   Principal Problem:   Cholecystitis Active Problems:   COPD with acute exacerbation (HCC)   Choledocholithiasis with acute cholecystitis   Acute pyelonephritis   Choledocholithiasis  Acute cholecystitis Acute ascending cholangitis -Secondary to obstruction CBD gallstones, blood cultures x 2, continue ceftriaxone and Flagyl -Status post ERCP - biliary sphincterotomy, balloon extraction of stones -s/p robotic assisted Lap chole on 2/13 - start CLD and advance to FLD per surgery - I've adjusted her pain meds and added Klonopin as she seems anxious   Complicated UTI with acute right-sided pyelonephritis -Continue ceftriaxone -Await urine culture   Leukocytosis -Secondary to above, no other signs of SIRS   Acute COPD exacerbation -stop steroids as she is no longer wheezing -Continue ICS and LABA -DuoNebs and as needed albuterol -Incentive spirometry and flutter valve -Antibiotics as above   IIDM with hyperglycemia -Hold off metformin -SSI for now, add semglee   HLD -Hold off statin till LFTs coming down.   DVT prophylaxis: (SCDs SCDs Start: 01/04/24 1610     Code Status: Full code Family Communication: None at bedside Disposition Plan: Possible discharge in next 1-2 days depending on postop recovery and clinical condition   Consultants:  GI Surgery  Procedures:  ERCP  Antimicrobials:  Rocephin Flagyl   Subjective:  She is requesting to adjust her pain meds as she is uncomfortable. Also feels  anxious  Objective: Vitals:   01/06/24 1245 01/06/24 1324 01/06/24 1729 01/06/24 2021  BP: (!) 138/94 133/65 133/69   Pulse: 86 91 85   Resp: 12 16 18    Temp:  98.1 F (36.7 C) 98.2 F (36.8 C)   TempSrc:  Oral    SpO2:  93% 94% 96%    Intake/Output Summary (Last 24 hours) at 01/06/2024 2029 Last data filed at 01/06/2024 0425 Gross per 24 hour  Intake 400 ml  Output --  Net 400 ml   There were no vitals filed for this visit.  Examination:  General exam: Appears anxious and in some pain Respiratory system: Clear to auscultation. Respiratory effort normal. Cardiovascular system: S1 & S2 heard, RRR. No JVD, murmurs. No pedal edema. Gastrointestinal system: Abdomen is tender at the site of surgery, dressing in place Central nervous system: Alert and oriented. No focal neurological deficits. Extremities: Symmetric 5 x 5 power. Skin: No rashes, lesions or ulcers Psychiatry: Judgement and insight appear normal. Mood & affect appropriate.     Data Reviewed: I have personally reviewed following labs and imaging studies  CBC: Recent Labs  Lab 01/03/24 1718 01/04/24 1106 01/06/24 0653  WBC 16.5* 11.1* 10.2  NEUTROABS 14.0*  --   --   HGB 13.8 12.2 11.1*  HCT 42.6 37.4 35.2*  MCV 83.9 82.4 84.2  PLT 356 301 297   Basic Metabolic Panel: Recent Labs  Lab 01/03/24 1718 01/04/24 1106 01/06/24 0653  NA 134* 131* 138  K 4.0 3.6 4.4  CL 91* 90* 99  CO2 31 29 31   GLUCOSE 312* 228* 183*  BUN 9 9 10  CREATININE 0.49 0.46 0.48  CALCIUM 9.5 8.2* 8.9  MG 1.6*  --   --    GFR: Estimated Creatinine Clearance: 71.6 mL/min (by C-G formula based on SCr of 0.48 mg/dL). Liver Function Tests: Recent Labs  Lab 01/03/24 1718 01/04/24 1106 01/06/24 0653  AST 346* 378* 143*  ALT 162* 304* 228*  ALKPHOS 141* 176* 193*  BILITOT 1.8* 3.3* 1.8*  PROT 7.8 6.8 6.7  ALBUMIN 3.8 3.1* 3.1*   Recent Labs  Lab 01/03/24 1718  LIPASE 35    HbA1C: Recent Labs    01/04/24 1649   HGBA1C 10.9*   CBG: Recent Labs  Lab 01/05/24 2059 01/06/24 0816 01/06/24 0846 01/06/24 1154 01/06/24 1721  GLUCAP 306* 173* 170* 268* 472*    Sepsis Labs: Recent Labs  Lab 01/04/24 0925  LATICACIDVEN 0.7    Recent Results (from the past 240 hours)  Blood culture (routine x 2)     Status: None (Preliminary result)   Collection Time: 01/04/24  9:25 AM   Specimen: BLOOD  Result Value Ref Range Status   Specimen Description BLOOD BLOOD RIGHT ARM ac  Final   Special Requests   Final    BOTTLES DRAWN AEROBIC AND ANAEROBIC Blood Culture results may not be optimal due to an inadequate volume of blood received in culture bottles   Culture   Final    NO GROWTH 2 DAYS Performed at [facility], [address], [place], [place] [address]    Report Status PENDING  Incomplete  Blood culture (routine x 2)     Status: None (Preliminary result)   Collection Time: 01/04/24  9:25 AM   Specimen: BLOOD  Result Value Ref Range Status   Specimen Description BLOOD BLOOD LEFT ARM ac  Final   Special Requests   Final    Blood Culture results may not be optimal due to an inadequate volume of blood received in culture bottles BOTTLES DRAWN AEROBIC AND ANAEROBIC   Culture   Final    NO GROWTH 2 DAYS Performed at [facility], [address], [place], [place] [address]    Report Status PENDING  Incomplete  Culture, blood (Routine X 2) w Reflex to ID Panel     Status: None (Preliminary result)   Collection Time: 01/04/24  4:49 PM   Specimen: BLOOD  Result Value Ref Range Status   Specimen Description BLOOD BLOOD RIGHT HAND  Final   Special Requests   Final    BOTTLES DRAWN AEROBIC ONLY Blood Culture results may not be optimal due to an inadequate volume of blood received in culture bottles   Culture   Final    NO GROWTH 2 DAYS Performed at [facility], [address] [place], [place] [address]    Report Status PENDING  Incomplete  Culture, blood (Routine X 2) w Reflex to ID  Panel     Status: None (Preliminary result)   Collection Time: 01/04/24  4:50 PM   Specimen: BLOOD  Result Value Ref Range Status   Specimen Description BLOOD BLOOD RIGHT ARM  Final   Special Requests   Final    BOTTLES DRAWN AEROBIC ONLY Blood Culture results may not be optimal due to an inadequate volume of blood received in culture bottles   Culture   Final    NO GROWTH 2 DAYS Performed at [facility] [address], [place], [place] [address]    Report Status PENDING  Incomplete  Radiology Studies: No results found.       Scheduled Meds:  budesonide (PULMICORT) nebulizer solution  0.25 mg Nebulization BID   clonazePAM  1 mg Oral BID   diclofenac  100 mg Rectal Once    HYDROmorphone (DILAUDID) injection  0.5 mg Intravenous Once   ibuprofen  800 mg Oral TID   insulin aspart  0-15 Units Subcutaneous TID WC   insulin aspart  0-5 Units Subcutaneous QHS   insulin glargine-yfgn  5 Units Subcutaneous QHS   ipratropium-albuterol  3 mL Nebulization Q6H   mometasone-formoterol  2 puff Inhalation BID   pantoprazole  40 mg Oral Daily   rOPINIRole  1 mg Oral TID   Continuous Infusions:  cefTRIAXone (ROCEPHIN)  IV 2 g (01/06/24 1338)   metronidazole Stopped (01/06/24 0001)     LOS: 2 days    Time spent: 35 minutes    Delfino Lovett, MD [facility] Pager [phone]  If 7PM-7AM, please contact night-coverage [email or web address]  01/06/2024, 8:29 PM

## 2024-01-06 NOTE — Op Note (Signed)
Robotic assisted laparoscopic Cholecystectomy  Pre-operative Diagnosis: acute cholecystitis and choledocholithiasis  Post-operative Diagnosis: same  Procedure:  Robotic assisted laparoscopic Cholecystectomy  Surgeon: Sterling Big, MD FACS  Anesthesia: Gen. with endotracheal tube  Findings: Severe Cholecystitis with distended GB  No evidence of bile leak Neovascularization with small vessels feeding the GB  Estimated Blood Loss: 10cc       Specimens: Gallbladder           Complications: none   Procedure Details  The patient was seen again in the Holding Room. The benefits, complications, treatment options, and expected outcomes were discussed with the patient. The risks of bleeding, infection, recurrence of symptoms, failure to resolve symptoms, bile duct damage, bile duct leak, retained common bile duct stone, bowel injury, any of which could require further surgery and/or ERCP, stent, or papillotomy were reviewed with the patient. The likelihood of improving the patient's symptoms with return to their baseline status is good.  The patient and/or family concurred with the proposed plan, giving informed consent.  The patient was taken to Operating Room, identified  and the procedure verified as Laparoscopic Cholecystectomy.  A Time Out was held and the above information confirmed.  Prior to the induction of general anesthesia, antibiotic prophylaxis was administered. VTE prophylaxis was in place. General endotracheal anesthesia was then administered and tolerated well. After the induction, the abdomen was prepped with Chloraprep and draped in the sterile fashion. The patient was positioned in the supine position.  Cut down technique was used to enter the abdominal cavity and a Hasson trochar was placed after two vicryl stitches were anchored to the fascia. Pneumoperitoneum was then created with CO2 and tolerated well without any adverse changes in the patient's vital signs.  Three 8-mm  ports were placed under direct vision. All skin incisions  were infiltrated with a local anesthetic agent before making the incision and placing the trocars.   The patient was positioned  in reverse Trendelenburg, robot was brought to the surgical field and docked in the standard fashion.  We made sure all the instrumentation was kept indirect view at all times and that there were no collision between the arms. I scrubbed out and went to the console.  The gallbladder was identified, THere was dense adhesion from the omentum to the gallbladder. Adhesions were lysed bluntly and I used cautery where feasible. It was a very tedious and time consuming dissection since she had significant inflammatory disease. It took at least 40 min of total operative time. After restoring appropriate anatomy.The fundus grasped and retracted cephalad. . The infundibulum was grasped and retracted laterally, exposing the peritoneum overlying the triangle of Calot. This was then divided and exposed in a blunt fashion. An extended critical view of the cystic duct and cystic artery was obtained.  The cystic duct was clearly identified and bluntly dissected.   Artery and duct were double clipped and divided. Using ICG cholangiography we visualize the cystic duct and CBD w/o evidence of bile injuries. The gallbladder was taken from the gallbladder fossa in a retrograde fashion with the electrocautery.  Hemostasis was achieved with the electrocautery. nspection of the right upper quadrant was performed. No bleeding, bile duct injury or leak, or bowel injury was noted. Robotic instruments and robotic arms were undocked in the standard fashion.  I scrubbed back in.  The gallbladder was removed and placed in an Endocatch bag.   Pneumoperitoneum was released.  The periumbilical port site was closed with interrumpted 0 Vicryl sutures. 4-0  subcuticular Monocryl was used to close the skin. Dermabond was  applied.  The patient was then  extubated and brought to the recovery room in stable condition. Sponge, lap, and needle counts were correct at closure and at the conclusion of the case.  Please note that this was a difficult case due to severe inflammation and presence of adhesions requiring additional operative time.           Sterling Big, MD, FACS

## 2024-01-07 ENCOUNTER — Telehealth (HOSPITAL_COMMUNITY): Payer: Self-pay | Admitting: Pharmacy Technician

## 2024-01-07 ENCOUNTER — Other Ambulatory Visit (HOSPITAL_COMMUNITY): Payer: Self-pay

## 2024-01-07 ENCOUNTER — Other Ambulatory Visit: Payer: Self-pay

## 2024-01-07 DIAGNOSIS — K8042 Calculus of bile duct with acute cholecystitis without obstruction: Secondary | ICD-10-CM | POA: Diagnosis not present

## 2024-01-07 DIAGNOSIS — K8050 Calculus of bile duct without cholangitis or cholecystitis without obstruction: Secondary | ICD-10-CM | POA: Diagnosis not present

## 2024-01-07 DIAGNOSIS — N10 Acute tubulo-interstitial nephritis: Secondary | ICD-10-CM | POA: Diagnosis not present

## 2024-01-07 LAB — COMPREHENSIVE METABOLIC PANEL
ALT: 157 U/L — ABNORMAL HIGH (ref 0–44)
AST: 61 U/L — ABNORMAL HIGH (ref 15–41)
Albumin: 2.8 g/dL — ABNORMAL LOW (ref 3.5–5.0)
Alkaline Phosphatase: 161 U/L — ABNORMAL HIGH (ref 38–126)
Anion gap: 8 (ref 5–15)
BUN: 13 mg/dL (ref 6–20)
CO2: 31 mmol/L (ref 22–32)
Calcium: 8.5 mg/dL — ABNORMAL LOW (ref 8.9–10.3)
Chloride: 100 mmol/L (ref 98–111)
Creatinine, Ser: 0.47 mg/dL (ref 0.44–1.00)
GFR, Estimated: >60 mL/min (ref >60)
Glucose, Bld: 225 mg/dL — ABNORMAL HIGH (ref 70–99)
Potassium: 4.1 mmol/L (ref 3.5–5.1)
Sodium: 139 mmol/L (ref 135–145)
Total Bilirubin: 1 mg/dL (ref 0.0–1.2)
Total Protein: 6 g/dL — ABNORMAL LOW (ref 6.5–8.1)

## 2024-01-07 LAB — CBC
HCT: 34 % — ABNORMAL LOW (ref 36.0–46.0)
Hemoglobin: 10.7 g/dL — ABNORMAL LOW (ref 12.0–15.0)
MCH: 27.2 pg (ref 26.0–34.0)
MCHC: 31.5 g/dL (ref 30.0–36.0)
MCV: 86.3 fL (ref 80.0–100.0)
Platelets: 306 10*3/uL (ref 150–400)
RBC: 3.94 MIL/uL (ref 3.87–5.11)
RDW: 12.5 % (ref 11.5–15.5)
WBC: 14.5 10*3/uL — ABNORMAL HIGH (ref 4.0–10.5)
nRBC: 0 % (ref 0.0–0.2)

## 2024-01-07 LAB — GLUCOSE, CAPILLARY
Glucose-Capillary: 213 mg/dL — ABNORMAL HIGH (ref 70–99)
Glucose-Capillary: 370 mg/dL — ABNORMAL HIGH (ref 70–99)

## 2024-01-07 LAB — SURGICAL PATHOLOGY

## 2024-01-07 MED ORDER — CEFPODOXIME PROXETIL 200 MG PO TABS
400.0000 mg | ORAL_TABLET | Freq: Two times a day (BID) | ORAL | 0 refills | Status: AC
  Filled 2024-01-07: qty 28, 7d supply, fill #0
  Filled 2024-01-07: qty 12, 3d supply, fill #1

## 2024-01-07 MED ORDER — AMOXICILLIN-POT CLAVULANATE 500-125 MG PO TABS
1.0000 | ORAL_TABLET | Freq: Three times a day (TID) | ORAL | 0 refills | Status: DC
  Filled 2024-01-07: qty 21, 7d supply, fill #0

## 2024-01-07 MED ORDER — CLONAZEPAM 0.5 MG PO TABS
0.5000 mg | ORAL_TABLET | ORAL | Status: AC
  Administered 2024-01-07: 0.5 mg via ORAL
  Filled 2024-01-07: qty 1

## 2024-01-07 MED ORDER — METRONIDAZOLE 500 MG PO TABS
500.0000 mg | ORAL_TABLET | Freq: Two times a day (BID) | ORAL | 0 refills | Status: AC
  Filled 2024-01-07: qty 14, 7d supply, fill #0

## 2024-01-07 MED ORDER — AMOXICILLIN-POT CLAVULANATE 500-125 MG PO TABS
1.0000 | ORAL_TABLET | Freq: Three times a day (TID) | ORAL | 0 refills | Status: DC
  Filled 2024-01-07: qty 30, 10d supply, fill #0

## 2024-01-07 MED ORDER — IBUPROFEN 200 MG PO TABS
400.0000 mg | ORAL_TABLET | Freq: Four times a day (QID) | ORAL | 0 refills | Status: DC | PRN
  Filled 2024-01-07: qty 20, 5d supply, fill #0
  Filled 2024-01-07: qty 100, 13d supply, fill #0

## 2024-01-07 MED ORDER — OXYCODONE HCL 5 MG PO TABS
5.0000 mg | ORAL_TABLET | Freq: Two times a day (BID) | ORAL | 0 refills | Status: DC | PRN
  Filled 2024-01-07: qty 6, 3d supply, fill #0

## 2024-01-07 NOTE — Plan of Care (Signed)

## 2024-01-07 NOTE — Telephone Encounter (Signed)
Pharmacy Patient Advocate Encounter   Received notification that prior authorization for Cefpodoxime Proxetil 200MG  tablets is required/requested.   Insurance verification completed.   The patient is insured through [facility] MEDICAID .   Per test claim: PA required; PA submitted to above mentioned insurance via CoverMyMeds Key/confirmation #/EOC [id number] Status is pending

## 2024-01-07 NOTE — Inpatient Diabetes Management (Signed)
Inpatient Diabetes Program Recommendations  AACE/ADA: New Consensus Statement on Inpatient Glycemic Control (2015)  Target Ranges:  Prepandial:   less than 140 mg/dL      Peak postprandial:   less than 180 mg/dL (1-2 hours)      Critically ill patients:  140 - 180 mg/dL    Latest Reference Range & Units 04/20/23 14:46 01/04/24 16:49  Hemoglobin A1C 4.8 - 5.6 % 12.0 (H) 10.9 (H)  266 mg/dl  (H): Data is abnormally high  Latest Reference Range & Units 01/06/24 08:16 01/06/24 08:46 01/06/24 11:54 01/06/24 17:21 01/06/24 21:14  Glucose-Capillary 70 - 99 mg/dL 161 (H)  4 mg Decadron 170 (H) 268 (H)  40 mg Soluemdrol 472 (H)  25 units Novolog  326 (H)  4 units Novolog  5 units Semglee  (H): Data is abnormally high  Admit with:  Acute cholecystitis Acute ascending cholangitis  History: DM  Home DM Meds: Glipizide 10 mg QAM      Metformin 500 mg BID      Ozempic 0.5 mg Qweek (Tuesday)   Current Orders: Novolog Moderate Correction Scale/ SSI (0-15 units) TID AC + HS     Semglee 5 units at bedtime   Stopped Solumedrol Semglee added last PM   Met w/ pt at bedside today.  Reviewed current A1c.  Reviewed goal A1c and goal CBGs for home.  Pt told me she started Ozempic about 1 month ago.  Has CBG meter at home.  Is frustrated that her CBGs are still elevated.  We talked abut the importance of nutrition and exercise as well to maintain better CBGs.  Pt told me she has f/u appt with PCP next Tuesday and plans to ask about starting insulin for home.     --Will follow patient during hospitalization--  Ambrose Finland RN, MSN, CDCES Diabetes Coordinator Inpatient Glycemic Control Team Team Pager: [phone] (8a-5p)

## 2024-01-07 NOTE — Progress Notes (Signed)
[facility] SURGICAL PROGRESS NOTE  Hospital Day(s): 3.   Post op day(s): 1 Day Post-Op.   Interval History:  Patient seen and examined No acute events or new complaints overnight.  Patient reports significant abdominal soreness; worse at umbilicus Some nausea No fever, chills, emesis She has mild leukocytosis; WBC 14.5K - likely reactive from OR Hgb to 10.7; stable  Renal function normal; sCr - 0.47; UO - unmeasured LFTs improved Bilirubin normalized; 1.0 this AM FLD  Vital signs in last 24 hours: [min-max] current  Temp:  [97.6 F (36.4 C)-98.2 F (36.8 C)] 98.1 F (36.7 C) (02/14 0136) Pulse Rate:  [82-100] 82 (02/14 0136) Resp:  [12-19] 19 (02/14 0136) BP: (103-148)/(59-113) 103/59 (02/14 0136) SpO2:  [93 %-99 %] 98 % (02/14 0153)             Intake/Output last 2 shifts:  No intake/output data recorded.   Physical Exam:  Constitutional: alert, cooperative and no distress  Respiratory: breathing non-labored at rest; undergoing breathing treatment  Cardiovascular: regular rate and sinus rhythm  Gastrointestinal: soft, incisional soreness, and non-distended Integumentary: Laparoscopic incisions are CDI with dermabond, no erythema   Labs:     Latest Ref Rng & Units 01/07/2024    4:38 AM 01/06/2024    6:53 AM 01/04/2024   11:06 AM  CBC  WBC 4.0 - 10.5 K/uL 14.5  10.2  11.1   Hemoglobin 12.0 - 15.0 g/dL 08.6  57.8  46.9   Hematocrit 36.0 - 46.0 % 34.0  35.2  37.4   Platelets 150 - 400 K/uL 306  297  301       Latest Ref Rng & Units 01/07/2024    4:38 AM 01/06/2024    6:53 AM 01/04/2024   11:06 AM  CMP  Glucose 70 - 99 mg/dL 629  528  413   BUN 6 - 20 mg/dL 13  10  9    Creatinine 0.44 - 1.00 mg/dL 2.44  0.10  2.72   Sodium 135 - 145 mmol/L 139  138  131   Potassium 3.5 - 5.1 mmol/L 4.1  4.4  3.6   Chloride 98 - 111 mmol/L 100  99  90   CO2 22 - 32 mmol/L 31  31  29    Calcium 8.9 - 10.3 mg/dL 8.5  8.9  8.2   Total Protein 6.5 - 8.1 g/dL 6.0   6.7  6.8   Total Bilirubin 0.0 - 1.2 mg/dL 1.0  1.8  3.3   Alkaline Phos 38 - 126 U/L 161  193  176   AST 15 - 41 U/L 61  143  378   ALT 0 - 44 U/L 157  228  304      Imaging studies: No new pertinent imaging studies   Assessment/Plan:  58 y.o. female 1 Day Post-Op s/p robotic assisted laparoscopic cholecystectomy for choledocholithiasis and cholecystitis    - Okay for diet as tolerated; recommend limiting/avoiding fatty foods   - Continue IV Abx (Rocephin); Can do 7 days PO for home   - Monitor abdominal examination; on-going bowel function  - Pain control prn; antiemetics prn   - Mobilize as tolerated  - Further management per primary service    - Discharge Planning: Nothing further from surgical perspective, we can follow up in 2-3 weeks as outpatient.    All of the above findings and recommendations were discussed with the patient, and the medical team, and all of patient's questions were answered to her  expressed satisfaction.  -- Lynden Oxford, PA-C [facility] 01/07/2024, 7:21 AM M-F: 7am - 4pm

## 2024-01-07 NOTE — Discharge Instructions (Signed)
In addition to included general post-operative instructions,  Diet: Resume home diet. Recommend avoiding or limiting fatty/greasy foods over the next few days/week. If you do eat these, you may (or may not) notice diarrhea. This is expected while your body adjusts to not having a gallbladder, and it typically resolves with time.    Activity: No heavy lifting >20 pounds (children, pets, laundry, garbage) or strenuous activity  for 4 weeks, but light activity and walking are encouraged. Do not drive or drink alcohol if taking narcotic pain medications or having pain that might distract from driving.  Wound care: 2 days after surgery (02/15), you may shower/get incision wet with soapy water and pat dry (do not rub incisions), but no baths or submerging incision underwater until follow-up.   Medications: Resume all home medications. For mild to moderate pain: acetaminophen (Tylenol) or ibuprofen/naproxen (if no kidney disease). Combining Tylenol with alcohol can substantially increase your risk of causing liver disease. Narcotic pain medications, if prescribed, can be used for severe pain, though may cause nausea, constipation, and drowsiness. Do not combine Tylenol and Percocet (or similar) within a 6 hour period as Percocet (and similar) contain(s) Tylenol. If you do not need the narcotic pain medication, you do not need to fill the prescription.  Call office [phone] / [phone]) at any time if any questions, worsening pain, fevers/chills, bleeding, drainage from incision site, or other concerns.

## 2024-01-07 NOTE — TOC CM/SW Note (Signed)
Transition of Care [facility]) - Inpatient Brief Assessment   Patient Details  Name: [name] MRN: [id number] Date of Birth: Apr 10, 1966  Transition of Care [facility]) CM/SW Contact:    Chapman Fitch, RN Phone Number: 01/07/2024, 10:36 AM   Clinical Narrative:  Patient to dc today Son at bedside Daughter to bring portable O2 for transport Patient level 5 mobility, and does not have a skilled need for home health services.  Patient updated.  Patient requesting services preparing meals, and light housework.  Patient to follow up with dss   Transition of Care Asessment: Insurance and Status: Insurance coverage has been reviewed Patient has primary care physician: Yes     Prior/Current Home Services: No current home services Social Drivers of Health Review: SDOH reviewed no interventions necessary Readmission risk has been reviewed: Yes Transition of care needs: no transition of care needs at this time

## 2024-01-07 NOTE — Telephone Encounter (Signed)
Pharmacy Patient Advocate Encounter  Received notification from [facility] MEDICAID that Prior Authorization for Cefpodoxime Proxetil 200MG  tablets  has been APPROVED from 01/07/2024 to 01/06/2025   PA #/Case ID/Reference #: [id number]

## 2024-01-08 ENCOUNTER — Other Ambulatory Visit: Payer: Self-pay

## 2024-01-08 NOTE — Discharge Summary (Signed)
 Physician Discharge Summary   Patient: [name] MRN: [id number] DOB: 1966-01-11  Admit date:     01/04/2024  Discharge date: 01/07/2024  Discharge Physician: Delfino Lovett   PCP: Kara Pacer, NP   Recommendations at discharge:    F/up with outpt providers as requested  Discharge Diagnoses: Principal Problem:   Cholecystitis Active Problems:   COPD with acute exacerbation [facility])   Choledocholithiasis with acute cholecystitis   Acute pyelonephritis   Choledocholithiasis  Hospital Course: Assessment and Plan:  58 y.o. female with medical history significant of COPD, gallstone, cigarette smoking, type 2 diabetes is admitted for acute cholecystitis and choledocholithiasis   2/11: GI and surgery consult, status post ERCP  2/12: Adjusted Requip dose per patient request.   2/13: robotic assisted Lap Chole   Acute cholecystitis Acute ascending cholangitis -Secondary to obstruction CBD gallstones, blood cultures x 2, continue ceftriaxone and Flagyl -Status post ERCP - biliary sphincterotomy, balloon extraction of stones -s/p robotic assisted Lap chole on 2/13 - Tolerated diet. Complete course of Abx   Complicated UTI with acute right-sided pyelonephritis Leukocytosis -Treated   Acute COPD exacerbation - improved now. -Continue ICS and LABA   IIDM with hyperglycemia HLD         Consultants: GI & Gen Surgery Procedures performed: ERCP & Lap Chole  Disposition: Home Diet recommendation:  Discharge Diet Orders (From admission, onward)     Start     Ordered   01/07/24 0000  Diet - low sodium heart healthy        01/07/24 1022           Carb modified diet DISCHARGE MEDICATION: Allergies as of 01/07/2024       Reactions   Levofloxacin Anaphylaxis   Levofloxacin In D5w Itching, Swelling, Rash   IV only IV only   Penicillins Other (See Comments)   convulsions   Doxycycline Nausea And Vomiting        Medication List     TAKE these  medications    acetaminophen 500 MG tablet Commonly known as: TYLENOL Take 500 mg by mouth every 6 (six) hours as needed for fever or mild pain.   albuterol (2.5 MG/3ML) 0.083% nebulizer solution Commonly known as: PROVENTIL Take 3 mLs (2.5 mg total) by nebulization every 6 (six) hours as needed for wheezing or shortness of breath.   albuterol 108 (90 Base) MCG/ACT inhaler Commonly known as: VENTOLIN HFA Inhale 2 puffs into the lungs every 4 (four) hours as needed for wheezing or shortness of breath.   cefpodoxime 200 MG tablet Commonly known as: VANTIN Take 2 tablets (400 mg total) by mouth 2 (two) times daily for 7 days.   glipiZIDE 5 MG tablet Commonly known as: GLUCOTROL Take 2 tablets (10 mg total) by mouth daily before breakfast.   ibuprofen 200 MG tablet Commonly known as: ADVIL Take 2 tablets (400 mg total) by mouth every 6 (six) hours as needed for fever, headache, mild pain (pain score 1-3), moderate pain (pain score 4-6) or cramping.   metFORMIN 500 MG tablet Commonly known as: GLUCOPHAGE Take 1 tablet (500 mg total) by mouth 2 (two) times daily with a meal.   methocarbamol 500 MG tablet Commonly known as: ROBAXIN Take 500 mg by mouth every 8 (eight) hours as needed for muscle spasms.   metroNIDAZOLE 500 MG tablet Commonly known as: Flagyl Take 1 tablet (500 mg total) by mouth 2 (two) times daily for 7 days.   ondansetron 4 MG disintegrating tablet  Commonly known as: ZOFRAN-ODT 4mg  ODT q4 hours prn nausea/vomit What changed:  how much to take how to take this when to take this reasons to take this   oxyCODONE 5 MG immediate release tablet Commonly known as: Oxy IR/ROXICODONE Take 1 tablet (5 mg total) by mouth every 12 (twelve) hours as needed for up to 3 days for moderate pain (pain score 4-6), severe pain (pain score 7-10) or breakthrough pain.   Ozempic (0.25 or 0.5 MG/DOSE) 2 MG/3ML Sopn Generic drug: Semaglutide(0.25 or 0.5MG /DOS) Inject 0.5 mg  into the skin once a week. Tuesdays.   pantoprazole 40 MG tablet Commonly known as: PROTONIX Take 1 tablet (40 mg total) by mouth daily.   rOPINIRole 1 MG tablet Commonly known as: REQUIP Take 1 mg by mouth at bedtime.   rosuvastatin 40 MG tablet Commonly known as: CRESTOR Take 40 mg by mouth daily.   SUMAtriptan 50 MG tablet Commonly known as: IMITREX Take 50 mg by mouth as needed for migraine or headache.   Symbicort 80-4.5 MCG/ACT inhaler Generic drug: budesonide-formoterol Inhale 2 puffs into the lungs 2 (two) times daily.        Follow-up Information     Donovan Kail, PA-C. Go on 01/19/2024.   Specialty: Physician Assistant Why: Go at 3:15pm. Contact information: [address] [place] [address] [phone]         Nsumanganyi, Colleen Can, NP. Schedule an appointment as soon as possible for a visit in 3 day(s).   Why: [facility] Discharge F/UP. Please call office to schedule your follow up. Contact information: [address] [place] [address] [phone]                Discharge Exam: There were no vitals filed for this visit. General exam: Appears anxious and in some pain Respiratory system: Clear to auscultation. Respiratory effort normal. Cardiovascular system: S1 & S2 heard, RRR. No JVD, murmurs. No pedal edema. Gastrointestinal system: Abdomen is tender at the site of surgery, dressing in place Central nervous system: Alert and oriented. No focal neurological deficits. Extremities: Symmetric 5 x 5 power. Skin: No rashes, lesions or ulcers Psychiatry: Judgement and insight appear normal. Mood & affect appropriate.   Condition at discharge: good  The results of significant diagnostics from this hospitalization (including imaging, microbiology, ancillary and laboratory) are listed below for reference.   Imaging Studies: DG Chest 1 View Result Date: 01/04/2024 CLINICAL DATA:  COPD, recent ERCP  EXAM: CHEST  1 VIEW COMPARISON:  04/20/2023 FINDINGS: Single frontal view of the chest demonstrates a stable cardiac silhouette. Background emphysema, with chronic areas of scarring again noted. Linear consolidation at the right lung base likely reflects subsegmental atelectasis or postinflammatory scarring. No airspace disease, effusion, or pneumothorax. No acute bony abnormalities. IMPRESSION: 1. Emphysema.  No acute intrathoracic process. Electronically Signed   By: Sharlet Salina M.D.   On: 01/04/2024 17:32   DG C-Arm 1-60 Min-No Report Result Date: 01/04/2024 Fluoroscopy was utilized by the requesting physician.  No radiographic interpretation.   MR 3D Recon At Scanner Result Date: 01/04/2024 CLINICAL DATA:  58 year old female with history of cholelithiasis. Generalized abdominal pain. EXAM: MRI ABDOMEN WITHOUT CONTRAST  (INCLUDING MRCP) TECHNIQUE: Multiplanar multisequence MR imaging of the abdomen was performed. Heavily T2-weighted images of the biliary and pancreatic ducts were obtained, and three-dimensional MRCP images were rendered by post processing. COMPARISON:  No prior abdominal MRI. CT of the abdomen and pelvis 01/03/2024. FINDINGS: Comment: Today's study  is limited for detection and characterization of visceral and/or vascular lesions by lack of IV gadolinium. Portions of today's examination are also limited by considerable patient respiratory motion. Lower chest: Unremarkable. Hepatobiliary: Heterogeneous loss of signal intensity throughout the hepatic parenchyma during out of phase dual echo images, indicative of a background of hepatic steatosis. No definite suspicious cystic or solid hepatic lesions are confidently identified on today's noncontrast examination. Gallbladder is severely distended. There are multiple filling defects lying dependently in the neck of the gallbladder, largest of which measures up to 1.9 cm in diameter. Gallbladder wall appears thickened and edematous with trace  volume of pericholecystic fluid. In the distal common bile duct shortly before the level of the ampulla (coronal image 79 of series 9) there is a small 5-6 mm filling defects which likely reflect choledocholithiasis. This is associated with proximal biliary tract dilatation, with the common bile duct measuring up to 13 mm in the porta hepatis. There is also mild intrahepatic biliary ductal dilatation. Pancreas: No definite pancreatic mass or peripancreatic fluid collections or inflammatory changes confidently identified on today's noncontrast examination. No pancreatic ductal dilatation. Spleen:  Unremarkable. Adrenals/Urinary Tract: T1 hypointense, T2 hyperintense lesions in the left kidney, incompletely characterized on today's noncontrast CT examination, but likely cysts (no imaging follow-up recommended) measuring up to 1.6 cm in the lower pole. There also some wedge-shaped areas of decreased diffusion in the lower pole of the right kidney and posterolateral interpolar region of the right kidney, suspicious for potential areas of pyelonephritis and/or developing infarcts. No hydroureteronephrosis in the visualized portions of the abdomen. Bilateral adrenal glands are normal in appearance. Stomach/Bowel: Visualized portions are unremarkable. Vascular/Lymphatic: No aneurysm identified in the visualized abdominal vasculature on today's noncontrast examination. Other:  No significant volume of ascites. Musculoskeletal: No aggressive appearing osseous lesions are noted in the visualized portions of the skeleton. IMPRESSION: 1. Study is positive for choledocholithiasis with 5-6 mm stones in the distal common bile duct shortly before the level of the ampulla with proximal biliary tract dilatation. 2. There is also cholelithiasis with evidence of acute cholecystitis, as above. 3. Patchy areas of diffusion restriction in the right kidney which could suggest areas of pyelonephritis or developing renal infarcts. 4. Hepatic  steatosis. Electronically Signed   By: Trudie Reed M.D.   On: 01/04/2024 11:28   MR ABDOMEN MRCP WO CONTRAST Result Date: 01/04/2024 CLINICAL DATA:  59 year old female with history of cholelithiasis. Generalized abdominal pain. EXAM: MRI ABDOMEN WITHOUT CONTRAST  (INCLUDING MRCP) TECHNIQUE: Multiplanar multisequence MR imaging of the abdomen was performed. Heavily T2-weighted images of the biliary and pancreatic ducts were obtained, and three-dimensional MRCP images were rendered by post processing. COMPARISON:  No prior abdominal MRI. CT of the abdomen and pelvis 01/03/2024. FINDINGS: Comment: Today's study is limited for detection and characterization of visceral and/or vascular lesions by lack of IV gadolinium. Portions of today's examination are also limited by considerable patient respiratory motion. Lower chest: Unremarkable. Hepatobiliary: Heterogeneous loss of signal intensity throughout the hepatic parenchyma during out of phase dual echo images, indicative of a background of hepatic steatosis. No definite suspicious cystic or solid hepatic lesions are confidently identified on today's noncontrast examination. Gallbladder is severely distended. There are multiple filling defects lying dependently in the neck of the gallbladder, largest of which measures up to 1.9 cm in diameter. Gallbladder wall appears thickened and edematous with trace volume of pericholecystic fluid. In the distal common bile duct shortly before the level of the ampulla (coronal image 79  of series 9) there is a small 5-6 mm filling defects which likely reflect choledocholithiasis. This is associated with proximal biliary tract dilatation, with the common bile duct measuring up to 13 mm in the porta hepatis. There is also mild intrahepatic biliary ductal dilatation. Pancreas: No definite pancreatic mass or peripancreatic fluid collections or inflammatory changes confidently identified on today's noncontrast examination. No  pancreatic ductal dilatation. Spleen:  Unremarkable. Adrenals/Urinary Tract: T1 hypointense, T2 hyperintense lesions in the left kidney, incompletely characterized on today's noncontrast CT examination, but likely cysts (no imaging follow-up recommended) measuring up to 1.6 cm in the lower pole. There also some wedge-shaped areas of decreased diffusion in the lower pole of the right kidney and posterolateral interpolar region of the right kidney, suspicious for potential areas of pyelonephritis and/or developing infarcts. No hydroureteronephrosis in the visualized portions of the abdomen. Bilateral adrenal glands are normal in appearance. Stomach/Bowel: Visualized portions are unremarkable. Vascular/Lymphatic: No aneurysm identified in the visualized abdominal vasculature on today's noncontrast examination. Other:  No significant volume of ascites. Musculoskeletal: No aggressive appearing osseous lesions are noted in the visualized portions of the skeleton. IMPRESSION: 1. Study is positive for choledocholithiasis with 5-6 mm stones in the distal common bile duct shortly before the level of the ampulla with proximal biliary tract dilatation. 2. There is also cholelithiasis with evidence of acute cholecystitis, as above. 3. Patchy areas of diffusion restriction in the right kidney which could suggest areas of pyelonephritis or developing renal infarcts. 4. Hepatic steatosis. Electronically Signed   By: Trudie Reed M.D.   On: 01/04/2024 05:23   CT ABDOMEN PELVIS W CONTRAST Result Date: 01/03/2024 CLINICAL DATA:  Generalized abdominal pain radiating to the back. History of gallstones. EXAM: CT ABDOMEN AND PELVIS WITH CONTRAST TECHNIQUE: Multidetector CT imaging of the abdomen and pelvis was performed using the standard protocol following bolus administration of intravenous contrast. RADIATION DOSE REDUCTION: This exam was performed according to the departmental dose-optimization program which includes automated  exposure control, adjustment of the mA and/or kV according to patient size and/or use of iterative reconstruction technique. CONTRAST:  OMNIPAQUE IOHEXOL 300 MG/ML  SOLN COMPARISON:  08/17/2021 FINDINGS: Lower chest: Redemonstration of innumerable tree in bud opacities at the lung bases, right more than left, consistent with chronic inflammatory disease, possibly atypical pneumonia. No dense consolidation or lobar collapse. No effusion. Hepatobiliary: Liver parenchyma is normal. The gallbladder is distended and there is surrounding edema. The patient is known to have gallstones but these are not calcified and not visible by CT. There is intra and extrahepatic ductal dilatation, presumably indicating choledocholithiasis. I do not see a pancreatic head mass. Pancreas: No pancreatic head mass identified. Presumed choledocholithiasis. No sign of pancreatitis. Spleen: Normal Adrenals/Urinary Tract: Adrenal glands are normal. No significant renal finding. Few small cysts. Normal bladder. Stomach/Bowel: Stomach and small intestine are normal. Normal appendix. Normal colon. Vascular/Lymphatic: Aorta and IVC are normal.  No adenopathy. Reproductive: No pelvic mass. Other: No free fluid or air. Musculoskeletal: Ordinary lumbar degenerative changes. Old minor compression deformity T12. IMPRESSION: 1. Distended gallbladder with surrounding edema. The patient is known to have gallstones but these are not calcified and not visible by CT. Findings quite worrisome for acute cholecystitis and obstruction. There is intra and extrahepatic ductal dilatation, presumably indicating choledocholithiasis responsible for biliary ductal obstruction. 2. Redemonstration of innumerable tree in bud opacities at the lung bases, right more than left, consistent with chronic inflammatory disease, possibly atypical pneumonia. Electronically Signed   By: Loraine Leriche  Shogry M.D.   On: 01/03/2024 21:30    Microbiology: Results for orders placed or  performed during the hospital encounter of 01/04/24  Culture, blood (Routine X 2) w Reflex to ID Panel     Status: None (Preliminary result)   Collection Time: 01/04/24  4:49 PM   Specimen: BLOOD  Result Value Ref Range Status   Specimen Description BLOOD BLOOD RIGHT HAND  Final   Special Requests   Final    BOTTLES DRAWN AEROBIC ONLY Blood Culture results may not be optimal due to an inadequate volume of blood received in culture bottles   Culture   Final    NO GROWTH 4 DAYS Performed at [facility], [address] [place], [place] [address]    Report Status PENDING  Incomplete  Culture, blood (Routine X 2) w Reflex to ID Panel     Status: None (Preliminary result)   Collection Time: 01/04/24  4:50 PM   Specimen: BLOOD  Result Value Ref Range Status   Specimen Description BLOOD BLOOD RIGHT ARM  Final   Special Requests   Final    BOTTLES DRAWN AEROBIC ONLY Blood Culture results may not be optimal due to an inadequate volume of blood received in culture bottles   Culture   Final    NO GROWTH 4 DAYS Performed at [facility], [address] [place], [place] [address]    Report Status PENDING  Incomplete    Labs: CBC: Recent Labs  Lab 01/03/24 1718 01/04/24 1106 01/06/24 0653 01/07/24 0438  WBC 16.5* 11.1* 10.2 14.5*  NEUTROABS 14.0*  --   --   --   HGB 13.8 12.2 11.1* 10.7*  HCT 42.6 37.4 35.2* 34.0*  MCV 83.9 82.4 84.2 86.3  PLT 356 301 297 306   Basic Metabolic Panel: Recent Labs  Lab 01/03/24 1718 01/04/24 1106 01/06/24 0653 01/07/24 0438  NA 134* 131* 138 139  K 4.0 3.6 4.4 4.1  CL 91* 90* 99 100  CO2 31 29 31 31   GLUCOSE 312* 228* 183* 225*  BUN 9 9 10 13   CREATININE 0.49 0.46 0.48 0.47  CALCIUM 9.5 8.2* 8.9 8.5*  MG 1.6*  --   --   --    Liver Function Tests: Recent Labs  Lab 01/03/24 1718 01/04/24 1106 01/06/24 0653 01/07/24 0438  AST 346* 378* 143* 61*  ALT 162* 304* 228* 157*  ALKPHOS 141* 176* 193* 161*  BILITOT 1.8*  3.3* 1.8* 1.0  PROT 7.8 6.8 6.7 6.0*  ALBUMIN 3.8 3.1* 3.1* 2.8*   CBG: Recent Labs  Lab 01/06/24 1154 01/06/24 1721 01/06/24 2114 01/07/24 0745 01/07/24 1141  GLUCAP 268* 472* 326* 213* 370*    Discharge time spent: greater than 30 minutes.  Signed: Delfino Lovett, MD [facility] 01/08/2024

## 2024-01-09 ENCOUNTER — Encounter (HOSPITAL_COMMUNITY): Payer: Self-pay | Admitting: Emergency Medicine

## 2024-01-09 ENCOUNTER — Other Ambulatory Visit: Payer: Self-pay

## 2024-01-09 ENCOUNTER — Emergency Department (HOSPITAL_COMMUNITY): Payer: Medicaid Other

## 2024-01-09 ENCOUNTER — Emergency Department (HOSPITAL_COMMUNITY)
Admission: EM | Admit: 2024-01-09 | Discharge: 2024-01-09 | Disposition: A | Discharge status: Home / Self Care | Payer: Medicaid Other | Attending: Emergency Medicine | Admitting: Emergency Medicine

## 2024-01-09 DIAGNOSIS — J449 Chronic obstructive pulmonary disease, unspecified: Secondary | ICD-10-CM | POA: Insufficient documentation

## 2024-01-09 DIAGNOSIS — Z794 Long term (current) use of insulin: Secondary | ICD-10-CM | POA: Diagnosis not present

## 2024-01-09 DIAGNOSIS — K5900 Constipation, unspecified: Secondary | ICD-10-CM | POA: Diagnosis not present

## 2024-01-09 DIAGNOSIS — G8918 Other acute postprocedural pain: Secondary | ICD-10-CM | POA: Insufficient documentation

## 2024-01-09 DIAGNOSIS — R109 Unspecified abdominal pain: Secondary | ICD-10-CM | POA: Diagnosis present

## 2024-01-09 DIAGNOSIS — R112 Nausea with vomiting, unspecified: Secondary | ICD-10-CM | POA: Diagnosis not present

## 2024-01-09 DIAGNOSIS — J45909 Unspecified asthma, uncomplicated: Secondary | ICD-10-CM | POA: Diagnosis not present

## 2024-01-09 DIAGNOSIS — I10 Essential (primary) hypertension: Secondary | ICD-10-CM | POA: Diagnosis not present

## 2024-01-09 DIAGNOSIS — Z79899 Other long term (current) drug therapy: Secondary | ICD-10-CM | POA: Insufficient documentation

## 2024-01-09 DIAGNOSIS — Z7951 Long term (current) use of inhaled steroids: Secondary | ICD-10-CM | POA: Diagnosis not present

## 2024-01-09 DIAGNOSIS — E119 Type 2 diabetes mellitus without complications: Secondary | ICD-10-CM | POA: Insufficient documentation

## 2024-01-09 DIAGNOSIS — Z7984 Long term (current) use of oral hypoglycemic drugs: Secondary | ICD-10-CM | POA: Diagnosis not present

## 2024-01-09 LAB — CBC WITH DIFFERENTIAL/PLATELET
Abs Immature Granulocytes: 0.04 10*3/uL (ref 0.00–0.07)
Basophils Absolute: 0.1 10*3/uL (ref 0.0–0.1)
Basophils Relative: 0 %
Eosinophils Absolute: 0.2 10*3/uL (ref 0.0–0.5)
Eosinophils Relative: 1 %
HCT: 41 % (ref 36.0–46.0)
Hemoglobin: 12.7 g/dL (ref 12.0–15.0)
Immature Granulocytes: 0 %
Lymphocytes Relative: 20 %
Lymphs Abs: 2.5 10*3/uL (ref 0.7–4.0)
MCH: 26.7 pg (ref 26.0–34.0)
MCHC: 31 g/dL (ref 30.0–36.0)
MCV: 86.1 fL (ref 80.0–100.0)
Monocytes Absolute: 0.8 10*3/uL (ref 0.1–1.0)
Monocytes Relative: 6 %
Neutro Abs: 9 10*3/uL — ABNORMAL HIGH (ref 1.7–7.7)
Neutrophils Relative %: 73 %
Platelets: 389 10*3/uL (ref 150–400)
RBC: 4.76 MIL/uL (ref 3.87–5.11)
RDW: 12.1 % (ref 11.5–15.5)
WBC: 12.5 10*3/uL — ABNORMAL HIGH (ref 4.0–10.5)
nRBC: 0 % (ref 0.0–0.2)

## 2024-01-09 LAB — COMPREHENSIVE METABOLIC PANEL
ALT: 91 U/L — ABNORMAL HIGH (ref 0–44)
AST: 33 U/L (ref 15–41)
Albumin: 3.4 g/dL — ABNORMAL LOW (ref 3.5–5.0)
Alkaline Phosphatase: 160 U/L — ABNORMAL HIGH (ref 38–126)
Anion gap: 9 (ref 5–15)
BUN: <5 mg/dL — ABNORMAL LOW (ref 6–20)
CO2: 34 mmol/L — ABNORMAL HIGH (ref 22–32)
Calcium: 9.2 mg/dL (ref 8.9–10.3)
Chloride: 93 mmol/L — ABNORMAL LOW (ref 98–111)
Creatinine, Ser: 0.35 mg/dL — ABNORMAL LOW (ref 0.44–1.00)
GFR, Estimated: >60 mL/min (ref >60)
Glucose, Bld: 267 mg/dL — ABNORMAL HIGH (ref 70–99)
Potassium: 3.8 mmol/L (ref 3.5–5.1)
Sodium: 136 mmol/L (ref 135–145)
Total Bilirubin: 0.8 mg/dL (ref 0.0–1.2)
Total Protein: 7 g/dL (ref 6.5–8.1)

## 2024-01-09 LAB — URINALYSIS, ROUTINE W REFLEX MICROSCOPIC
Bilirubin Urine: NEGATIVE
Glucose, UA: 50 mg/dL — AB
Hgb urine dipstick: NEGATIVE
Ketones, ur: NEGATIVE mg/dL
Leukocytes,Ua: NEGATIVE
Nitrite: NEGATIVE
Protein, ur: NEGATIVE mg/dL
Specific Gravity, Urine: 1.004 — ABNORMAL LOW (ref 1.005–1.030)
pH: 8 (ref 5.0–8.0)

## 2024-01-09 LAB — CULTURE, BLOOD (ROUTINE X 2)
Culture: NO GROWTH
Culture: NO GROWTH
Culture: NO GROWTH
Culture: NO GROWTH

## 2024-01-09 LAB — HCG, QUANTITATIVE, PREGNANCY: hCG, Beta Chain, Quant, S: 2 m[IU]/mL (ref <5)

## 2024-01-09 LAB — LIPASE, BLOOD: Lipase: 26 U/L (ref 11–51)

## 2024-01-09 MED ORDER — OXYCODONE HCL 5 MG PO TABS: 5.0000 mg | ORAL_TABLET | ORAL | 0 refills | Status: AC | PRN

## 2024-01-09 MED ORDER — HYDROMORPHONE HCL 1 MG/ML IJ SOLN
1.0000 mg | Freq: Once | INTRAMUSCULAR | Status: AC
  Administered 2024-01-09: 1 mg via INTRAVENOUS
  Filled 2024-01-09: qty 1

## 2024-01-09 MED ORDER — IOHEXOL 300 MG/ML  SOLN
100.0000 mL | Freq: Once | INTRAMUSCULAR | Status: AC | PRN
  Administered 2024-01-09: 100 mL via INTRAVENOUS

## 2024-01-09 MED ORDER — DOCUSATE SODIUM 100 MG PO CAPS: 100.0000 mg | ORAL_CAPSULE | Freq: Two times a day (BID) | ORAL | 0 refills | Status: DC

## 2024-01-09 MED ORDER — DOCUSATE SODIUM 100 MG PO CAPS
100.0000 mg | ORAL_CAPSULE | Freq: Once | ORAL | Status: AC
  Administered 2024-01-09: 100 mg via ORAL
  Filled 2024-01-09: qty 1

## 2024-01-09 MED ORDER — POLYETHYLENE GLYCOL 3350 17 G PO PACK
17.0000 g | PACK | Freq: Once | ORAL | Status: AC
Start: 2024-01-09 — End: 2024-01-09
  Administered 2024-01-09: 17 g via ORAL
  Filled 2024-01-09: qty 1

## 2024-01-09 MED ORDER — ONDANSETRON HCL 4 MG/2ML IJ SOLN
4.0000 mg | Freq: Three times a day (TID) | INTRAMUSCULAR | Status: DC | PRN
  Administered 2024-01-09: 4 mg via INTRAVENOUS
  Filled 2024-01-09: qty 2

## 2024-01-09 MED ORDER — HYDROMORPHONE HCL 1 MG/ML IJ SOLN
0.5000 mg | Freq: Once | INTRAMUSCULAR | Status: AC
  Administered 2024-01-09: 0.5 mg via INTRAVENOUS
  Filled 2024-01-09: qty 0.5

## 2024-01-09 NOTE — ED Provider Notes (Signed)
 [place] EMERGENCY DEPARTMENT AT [facility] Provider Note   CSN: [id number] Arrival date & time: 01/09/24  1432     History  Chief Complaint  Patient presents with   Abdominal Pain    [name] is a 58 y.o. female with PMH as listed below who presents with increased abd pain since last night. Recent gallbladder removal 01/06/24. Yesterday and today began to have more pain, all over, a/w N/V and fever at home. Did not take her temperature. Hasn't had a bowel movement since surgery and thinks she might be passing small amounts of gas. No urinary sxs.   Past Medical History:  Diagnosis Date   Angina    Anxiety state 10/15/2015   ARDS (adult respiratory distress syndrome) (HCC)    Jan 2011   Asthma    Chronic back pain    COPD (chronic obstructive pulmonary disease) (HCC)    Diabetes mellitus    Gallstones with biliary obstruction    GERD (gastroesophageal reflux disease) 12/21/2012   HTN (hypertension) 10/15/2015   Hyperglycemia, drug-induced    steroid induced hyperglycemia   Migraine headache    On home O2    Pneumonia    Recurrent upper respiratory infection (URI)    Shortness of breath        Home Medications Prior to Admission medications   Medication Sig Start Date End Date Taking? Authorizing Provider  docusate sodium (COLACE) 100 MG capsule Take 1 capsule (100 mg total) by mouth every 12 (twelve) hours. 01/09/24  Yes Loetta Rough, MD  acetaminophen (TYLENOL) 500 MG tablet Take 500 mg by mouth every 6 (six) hours as needed for fever or mild pain.    [provider]  albuterol (PROVENTIL) (2.5 MG/3ML) 0.083% nebulizer solution Take 3 mLs (2.5 mg total) by nebulization every 6 (six) hours as needed for wheezing or shortness of breath. 03/13/22   Mariea Clonts, Courage, MD  albuterol (VENTOLIN HFA) 108 (90 Base) MCG/ACT inhaler Inhale 2 puffs into the lungs every 4 (four) hours as needed for wheezing or shortness of breath. 03/13/22   Mariea Clonts,  Courage, MD  glipiZIDE (GLUCOTROL) 5 MG tablet Take 2 tablets (10 mg total) by mouth daily before breakfast. 04/23/23   Tat, Onalee Hua, MD  ibuprofen (ADVIL) 200 MG tablet Take 2 tablets (400 mg total) by mouth every 6 (six) hours as needed for fever, headache, mild pain (pain score 1-3), moderate pain (pain score 4-6) or cramping. 01/07/24   Delfino Lovett, MD  metFORMIN (GLUCOPHAGE) 500 MG tablet Take 1 tablet (500 mg total) by mouth 2 (two) times daily with a meal. 04/23/23   Tat, Onalee Hua, MD  methocarbamol (ROBAXIN) 500 MG tablet Take 500 mg by mouth every 8 (eight) hours as needed for muscle spasms. 08/13/21   [provider]  ondansetron (ZOFRAN-ODT) 4 MG disintegrating tablet 4mg  ODT q4 hours prn nausea/vomit Patient taking differently: Take 4 mg by mouth every 4 (four) hours as needed for nausea or vomiting. 4mg  ODT q4 hours prn nausea/vomit 04/19/22   Bethann Berkshire, MD  OZEMPIC, 0.25 OR 0.5 MG/DOSE, 2 MG/3ML SOPN Inject 0.5 mg into the skin once a week. Tuesdays. 12/28/23   [provider]  pantoprazole (PROTONIX) 40 MG tablet Take 1 tablet (40 mg total) by mouth daily. 03/13/22   Shon Hale, MD  rOPINIRole (REQUIP) 1 MG tablet Take 1 mg by mouth at bedtime. 03/09/23   [provider]  rosuvastatin (CRESTOR) 40 MG tablet Take 40 mg by mouth  daily. 08/17/23   [provider]  SUMAtriptan (IMITREX) 50 MG tablet Take 50 mg by mouth as needed for migraine or headache. 11/29/21   [provider]  SYMBICORT 80-4.5 MCG/ACT inhaler Inhale 2 puffs into the lungs 2 (two) times daily. 12/28/23   [provider]  ARIPiprazole (ABILIFY) 5 MG tablet Take 5 mg by mouth daily.    02/03/12  [provider]  venlafaxine (EFFEXOR) 75 MG tablet Take 75 mg by mouth daily.    02/03/12  [provider]      Allergies    Levofloxacin, Levofloxacin in d5w, Penicillins, and Doxycycline    Review of Systems   Review of Systems A 10 point review of systems was  performed and is negative unless otherwise reported in HPI.  Physical Exam Updated Vital Signs BP 133/82 (BP Location: Left Arm)   Pulse 93   Temp 98.7 F (37.1 C) (Axillary)   Resp 20   Ht 5\' 1"  (1.549 m)   Wt 71 kg   SpO2 95%   BMI 29.58 kg/m  Physical Exam General: Normal appearing female, lying in bed.  HEENT: PERRLA, Sclera anicteric, MMM, trachea midline.  Cardiology: RRR, no murmurs/rubs/gallops. BL radial and DP pulses equal bilaterally.  Resp: Normal respiratory rate and effort. CTAB, no wheezes, rhonchi, crackles.  Abd: Distended abdomen.  Recent surgical incisions sutured closed, no surrounding erythema, induration, purulence.. No rebound tenderness or guarding.  GU: Deferred. MSK: No peripheral edema or signs of trauma. Extremities without deformity or TTP. No cyanosis or clubbing. Skin: warm, dry. No rashes or lesions. Back: No CVA tenderness Neuro: A&Ox4, CNs II-XII grossly intact. MAEs. Sensation grossly intact.  Psych: Normal mood and affect.   ED Results / Procedures / Treatments   Labs (all labs ordered are listed, but only abnormal results are displayed) Labs Reviewed  COMPREHENSIVE METABOLIC PANEL - Abnormal; Notable for the following components:      Result Value   Chloride 93 (*)    CO2 34 (*)    Glucose, Bld 267 (*)    BUN <5 (*)    Creatinine, Ser 0.35 (*)    Albumin 3.4 (*)    ALT 91 (*)    Alkaline Phosphatase 160 (*)    All other components within normal limits  CBC WITH DIFFERENTIAL/PLATELET - Abnormal; Notable for the following components:   WBC 12.5 (*)    Neutro Abs 9.0 (*)    All other components within normal limits  URINALYSIS, ROUTINE W REFLEX MICROSCOPIC - Abnormal; Notable for the following components:   Color, Urine STRAW (*)    Specific Gravity, Urine 1.004 (*)    Glucose, UA 50 (*)    All other components within normal limits  LIPASE, BLOOD  HCG, QUANTITATIVE, PREGNANCY    EKG EKG Interpretation Date/Time:  Sunday  January 09 2024 15:58:27 EST Ventricular Rate:  103 PR Interval:  183 QRS Duration:  105 QT Interval:  377 QTC Calculation: 494 R Axis:   100  Text Interpretation: Sinus tachycardia Consider right atrial enlargement RVH with secondary repolarization abnrm Borderline prolonged QT interval Artifact in lead(s) I II aVR aVL Confirmed by Geoffery Lyons ([id number]) on 01/10/2024 10:43:58 PM  Radiology CT abd pelvis w/ contrast: Changes of recent cholecystectomy. Stranding and a couple small locules of gas in the gallbladder fossa, likely related to recent postoperative state. Decreasing biliary ductal dilatation since prior study.   Innumerable tree-in-bud nodular densities in the lower lobes, right greater than left, similar  prior study, likely chronic inflammatory process.    Procedures Procedures    Medications Ordered in ED Medications  HYDROmorphone (DILAUDID) injection 0.5 mg (0.5 mg Intravenous Given 01/09/24 1544)  iohexol (OMNIPAQUE) 300 MG/ML solution 100 mL (100 mLs Intravenous Contrast Given 01/09/24 1729)  HYDROmorphone (DILAUDID) injection 1 mg (1 mg Intravenous Given 01/09/24 1726)  docusate sodium (COLACE) capsule 100 mg (100 mg Oral Given 01/09/24 2046)  polyethylene glycol (MIRALAX / GLYCOLAX) packet 17 g (17 g Oral Given 01/09/24 2047)    ED Course/ Medical Decision Making/ A&P                          Medical Decision Making Amount and/or Complexity of Data Reviewed Labs: ordered. Decision-making details documented in ED Course. Radiology: ordered. Decision-making details documented in ED Course.  Risk OTC drugs. Prescription drug management.    This patient presents to the ED for concern of abdominal pain postop, this involves an extensive number of treatment options, and is a complaint that carries with it a high risk of complications and morbidity.  I considered the following differential and admission for this acute, potentially life threatening condition.    MDM:    Consider complications postop such as biliary leak, intestinal perforation, bowel obstruction.  Highest degree of concern for bowel obstruction given the patient seems very distended and has not yet had a bowel movement postop. Patient is afebrile, reassuringly, and HDS. Will give antiemetic/analgesia. Neg lipase, low c/f pancreatitis. No urinary sxs or CVA ttp to indicate pyelonephritis. Consider also constipation as she has not had a BM yet postop. Discussed with patient that she needs to take bowel reg while on opiates for pain control as these are very constipating.  Clinical Course as of 01/15/24 1520  Sun Jan 09, 2024  1713 HCG, Beta Chain, Quant, S: 2 neg [HN]  1713 WBC(!): 12.5 +leukocytosis w/ left shift [HN]  1714 Lipase: 26 neg [HN]  1714 Comprehensive metabolic panel(!) Improving LFTs [HN]  1714 Urinalysis, Routine w reflex microscopic -Urine, Clean Catch(!) Unremarkable in the context of this patient's presentation  [HN]  1819 CT ABDOMEN PELVIS W CONTRAST Changes of recent cholecystectomy. Stranding and a couple small locules of gas in the gallbladder fossa, likely related to recent postoperative state. Decreasing biliary ductal dilatation since prior study.  Innumerable tree-in-bud nodular densities in the lower lobes, right greater than left, similar prior study, likely chronic inflammatory process.   [HN]  2025 Patient's pain is improved, she has tolerated PO. She prefers to be discharged and try to control her pain at home. She already has zofran at home but is not on anything for constipation. Given miralax/colace, instructed to stay well hydrated. Will also give a few days of pain meds to get through to her appt. Again discussed the constipating effects of opiates. Already has f/u with her PCP on Tuesday. DC w/ discharge instructions/return precautions. All questions answered to patient's satisfaction.   [HN]    Clinical Course User Index [HN] Loetta Rough, MD    Labs: I Ordered, and personally interpreted labs.  The pertinent results include: Those listed above  Imaging Studies ordered: I ordered imaging studies including CT of the pelvis with contrast I independently visualized and interpreted imaging. I agree with the radiologist interpretation  Additional history obtained from chart review  Cardiac Monitoring: The patient was maintained on a cardiac monitor.  I personally viewed and interpreted the cardiac monitored which showed  an underlying rhythm of: NSR  Reevaluation: After the interventions noted above, I reevaluated the patient and found that they have :improved  Social Determinants of Health: lives independently  Disposition:  DC  Co morbidities that complicate the patient evaluation  Past Medical History:  Diagnosis Date   Angina    Anxiety state 10/15/2015   ARDS (adult respiratory distress syndrome) (HCC)    Jan 2011   Asthma    Chronic back pain    COPD (chronic obstructive pulmonary disease) (HCC)    Diabetes mellitus    Gallstones with biliary obstruction    GERD (gastroesophageal reflux disease) 12/21/2012   HTN (hypertension) 10/15/2015   Hyperglycemia, drug-induced    steroid induced hyperglycemia   Migraine headache    On home O2    Pneumonia    Recurrent upper respiratory infection (URI)    Shortness of breath      Medicines Meds ordered this encounter  Medications   DISCONTD: ondansetron (ZOFRAN) injection 4 mg   HYDROmorphone (DILAUDID) injection 0.5 mg   iohexol (OMNIPAQUE) 300 MG/ML solution 100 mL   HYDROmorphone (DILAUDID) injection 1 mg   docusate sodium (COLACE) 100 MG capsule    Sig: Take 1 capsule (100 mg total) by mouth every 12 (twelve) hours.    Dispense:  60 capsule    Refill:  0   docusate sodium (COLACE) capsule 100 mg   polyethylene glycol (MIRALAX / GLYCOLAX) packet 17 g   oxyCODONE (ROXICODONE) 5 MG immediate release tablet    Sig: Take 1 tablet (5 mg total)  by mouth every 4 (four) hours as needed for up to 3 days for severe pain (pain score 7-10).    Dispense:  12 tablet    Refill:  0    I have reviewed the patients home medicines and have made adjustments as needed  Problem List / ED Course: Problem List Items Addressed This Visit   None Visit Diagnoses       Post-operative pain    -  Primary     Constipation, unspecified constipation type                       This note was created using dictation software, which may contain spelling or grammatical errors.    Loetta Rough, MD 01/15/24 [phone]

## 2024-01-09 NOTE — ED Notes (Signed)
 Patient given ice chips @ this time. RN made aware

## 2024-01-09 NOTE — ED Triage Notes (Signed)
 Pt c/o increased abd pain since last night. Recent gallbladder removal 01/06/24.

## 2024-01-09 NOTE — Discharge Instructions (Addendum)
 Thank you for coming to [facility] Emergency Department. You were seen for abdominal pain post-op. We did an exam, labs, and imaging, and these showed no acute findings. Your liver levels are downtrending. Please take miralax 1-2 capfuls per day and colace 100 mg twice per day. We have also prescribed oxycodone 5 mg to take for breakthrough pain. This will make you more constipated. Please stay well hydrated.  Please follow up with your primary care provider on Tuesday.   Do not hesitate to return to the ED or call 911 if you experience: -Worsening symptoms -Lightheadedness, passing out -Fevers/chills -Anything else that concerns you

## 2024-01-10 ENCOUNTER — Other Ambulatory Visit: Payer: Self-pay

## 2024-01-17 ENCOUNTER — Telehealth: Payer: Self-pay | Admitting: Radiology

## 2024-01-17 ENCOUNTER — Other Ambulatory Visit (INDEPENDENT_AMBULATORY_CARE_PROVIDER_SITE_OTHER): Payer: Self-pay

## 2024-01-17 ENCOUNTER — Ambulatory Visit: Payer: Medicaid Other | Admitting: Orthopedic Surgery

## 2024-01-17 ENCOUNTER — Encounter: Payer: Self-pay | Admitting: Orthopedic Surgery

## 2024-01-17 VITALS — BP 151/88 | HR 118 | Ht 60.0 in | Wt 137.0 lb

## 2024-01-17 DIAGNOSIS — M19011 Primary osteoarthritis, right shoulder: Secondary | ICD-10-CM

## 2024-01-17 DIAGNOSIS — M19019 Primary osteoarthritis, unspecified shoulder: Secondary | ICD-10-CM

## 2024-01-17 DIAGNOSIS — G8929 Other chronic pain: Secondary | ICD-10-CM

## 2024-01-17 NOTE — Progress Notes (Signed)
 Office Visit Note   Patient: [name]           Date of Birth: Feb 07, 1966           MRN: [id number] Visit Date: 01/17/2024 Requested by: Kara Pacer, NP [address] [place],  [place] [address] PCP: Kara Pacer, NP   Assessment & Plan:   58 year old female with glenohumeral arthritis worsening.  She is not a great surgical candidate she is on oxygen she has COPD she has diabetes GERD history of sepsis  Recommend intra-articular injection  If no improvement then a consult can be obtained to see if surgery is an option Encounter Diagnoses  Name Primary?   Chronic right shoulder pain Yes   Glenohumeral arthritis     No orders of the defined types were placed in this encounter.   Encounter Diagnoses  Name Primary?   Chronic right shoulder pain Yes   Glenohumeral arthritis       Subjective: Chief Complaint  Patient presents with   Shoulder Pain    Right shoulder popping and grinding she can not extend her right arm across her body without pain can not straighten or lift over her head at all  to extend the arm straight out  causes popping     HPI: 58 year old female COPD on oxygen 2-year history of right shoulder pain prior x-rays show osteoarthritis  Complains of popping clicking and inability to raise the opposite              ROS: COPD shortness of breath   Images personally read and my interpretation : Previous imaging shows a osteophyte inferior medial humeral head area joint space maintained no height difference in the humeral head  Visit Diagnoses:  1. Chronic right shoulder pain   2. Glenohumeral arthritis      Follow-Up Instructions: Return if symptoms worsen or fail to improve.    Objective: Vital Signs: BP (!) 151/88   Pulse (!) 118   Ht 5' (1.524 m)   Wt 137 lb (62.1 kg)   BMI 26.76 kg/m   Physical Exam   Ortho Exam   Specialty Comments:  No specialty comments available.  Imaging: DG  Shoulder Right Result Date: 01/17/2024 Right shoulder x-ray right shoulder pain worse over 2 years history of OA right shoulder X-ray shows progressive joint space narrowing and increased osteophyte medial humerus Curved acromion no spur Impression OA right shoulder moderate     PMFS History: Patient Active Problem List   Diagnosis Date Noted   Choledocholithiasis 01/05/2024   Choledocholithiasis with acute cholecystitis 01/04/2024   Cholecystitis 01/04/2024   Acute pyelonephritis 01/04/2024   Acute on chronic respiratory failure (HCC) 04/20/2023   Hyperlipidemia 04/20/2023   Hypokalemia 04/20/2023   Hyponatremia 04/20/2023   Atypical chest pain 04/20/2023   ERRONEOUS ENCOUNTER--DISREGARD 10/30/2022   Electrocardiogram abnormal 04/22/2022   Polyneuropathy due to type 2 diabetes mellitus (HCC) 08/22/2021   Oxygen dependent 08/22/2021   Migraine 08/22/2021   Sepsis due to undetermined organism (HCC) 08/17/2021   Chronic pain syndrome 11/27/2019   Lobar pneumonia (HCC) 11/27/2019   Atypical pneumonia - SEPSIS RULED OUT 09/28/2016   Chronic obstructive pulmonary disease (HCC)    Sepsis (HCC) 10/15/2015   Anxiety state 10/15/2015   Essential hypertension 10/15/2015   COPD with acute exacerbation (HCC) 08/14/2014   Community acquired pneumonia 03/11/2014   COPD exacerbation (HCC) 04/22/2013   GERD (gastroesophageal reflux disease) 12/21/2012   Candida infection 11/26/2011  DM (diabetes mellitus) (HCC) 11/21/2011   Oral candidiasis 07/03/2011   Cigarette smoker 09/12/2010   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  02/14/2010   ADULT RESPIRATORY DISTRESS SYNDROME 02/13/2010   DELIRIUM 02/13/2010   Neck pain 05/29/2004   Past Medical History:  Diagnosis Date   Angina    Anxiety state 10/15/2015   ARDS (adult respiratory distress syndrome) (HCC)    Jan 2011   Asthma    Chronic back pain    COPD (chronic obstructive pulmonary disease) (HCC)    Diabetes mellitus     Gallstones with biliary obstruction    GERD (gastroesophageal reflux disease) 12/21/2012   HTN (hypertension) 10/15/2015   Hyperglycemia, drug-induced    steroid induced hyperglycemia   Migraine headache    On home O2    Pneumonia    Recurrent upper respiratory infection (URI)    Shortness of breath     Family History  Problem Relation Age of Onset   Coronary artery disease Brother    Diabetes Other    Cancer Other    Hypertension Other     Past Surgical History:  Procedure Laterality Date   BILIARY DILATION  01/04/2024   Procedure: BILIARY DILATION;  Surgeon: Midge Minium, MD;  Location: [facility] ENDOSCOPY;  Service: Endoscopy;;   c-section     ENDOSCOPIC RETROGRADE CHOLANGIOPANCREATOGRAPHY (ERCP) WITH PROPOFOL N/A 01/04/2024   Procedure: ENDOSCOPIC RETROGRADE CHOLANGIOPANCREATOGRAPHY (ERCP) WITH PROPOFOL;  Surgeon: Midge Minium, MD;  Location: [facility] ENDOSCOPY;  Service: Endoscopy;  Laterality: N/A;   REMOVAL OF STONES  01/04/2024   Procedure: REMOVAL OF STONES;  Surgeon: Midge Minium, MD;  Location: [facility] ENDOSCOPY;  Service: Endoscopy;;   TRACHEOSTOMY     decannulated 12/2009   TUBAL LIGATION     uterine ablasion     Social History   Occupational History   Not on file  Tobacco Use   Smoking status: Every Day    Current packs/day: 0.50    Average packs/day: 0.5 packs/day for 42.0 years (21.0 ttl pk-yrs)    Types: Cigarettes   Smokeless tobacco: Never  Vaping Use   Vaping status: Never Used  Substance and Sexual Activity   Alcohol use: No   Drug use: No    Comment: Formerly used cocaine , narcotics, THC   Sexual activity: Yes    Birth control/protection: Other-see comments

## 2024-01-17 NOTE — Telephone Encounter (Signed)
 Sch for March 3rd at 10 am arrive 15 min early for ck in  Called her to discuss

## 2024-01-17 NOTE — Progress Notes (Signed)
  Intake history:  There were no vitals taken for this visit. There is no height or weight on file to calculate BMI.    WHAT ARE WE SEEING YOU FOR TODAY?   right shoulder  How long has this bothered you? (DOI?DOS?WS?)  2 year(s) ago  Anticoag.  No  Diabetes Yes  Heart disease No  Hypertension Yes  SMOKING HX Yes  Kidney disease No  Any ALLERGIES ______________________________________________   Treatment:  Have you taken:  Tylenol No  Advil No  Had PT No  Had injection No  Other  _________________________

## 2024-01-17 NOTE — Patient Instructions (Signed)
 Physical therapy has been ordered for you at [facility]. They should call you to schedule, [phone] is the phone number to call, if you want to call to schedule.

## 2024-01-17 NOTE — Telephone Encounter (Signed)
 Needs next week Right shoulder injection at [facility]

## 2024-01-19 ENCOUNTER — Encounter: Payer: Self-pay | Admitting: Physician Assistant

## 2024-01-19 ENCOUNTER — Ambulatory Visit (INDEPENDENT_AMBULATORY_CARE_PROVIDER_SITE_OTHER): Payer: Medicaid Other | Admitting: Physician Assistant

## 2024-01-19 VITALS — BP 172/89 | HR 110 | Temp 98.8°F | Ht 60.0 in | Wt 134.6 lb

## 2024-01-19 DIAGNOSIS — K819 Cholecystitis, unspecified: Secondary | ICD-10-CM

## 2024-01-19 DIAGNOSIS — K8062 Calculus of gallbladder and bile duct with acute cholecystitis without obstruction: Secondary | ICD-10-CM

## 2024-01-19 DIAGNOSIS — Z09 Encounter for follow-up examination after completed treatment for conditions other than malignant neoplasm: Secondary | ICD-10-CM

## 2024-01-19 MED ORDER — CLINDAMYCIN HCL 300 MG PO CAPS: 300.0000 mg | ORAL_CAPSULE | Freq: Three times a day (TID) | ORAL | 0 refills | Status: AC

## 2024-01-19 NOTE — Progress Notes (Signed)
 [name] POST-OP OFFICE VISIT  01/19/2024  HPI: [name] is a 58 y.o. female 13 days s/p robotic assisted laparoscopic cholecystectomy for choledocholithiasis with Dr Everlene Farrier   Overall doing reasonably well considering She does have tenderness at umbilical incision site, this is also erythematous No fever, chills, nausea, emesis Tolerating PO; no diarrhea All other incisions are healing well   Vital signs: BP (!) 172/89   Pulse (!) 110   Temp 98.8 F (37.1 C) (Oral)   Ht 5' (1.524 m)   Wt 134 lb 9.6 oz (61.1 kg)   SpO2 96%   BMI 26.29 kg/m    Physical Exam: Constitutional: Well appearing female, NAD Abdomen: Soft, umbilical incision is indurated and tender, non-distended, no rebound/guarding Skin: Umbilical incision is erythematous, no drainage. All other laparoscopic incisions are healing well, no erythema or drainage   Assessment/Plan: This is a 58 y.o. female 13 days s/p robotic assisted laparoscopic cholecystectomy for choledocholithiasis with Dr Everlene Farrier    - I will send her Clindamycin x7 days to treat possible cellulitis at the umbilical incision as a precaution, especially given her comorbidities  - Pain control prn; continue OTC modalities   - Reviewed wound care recommendation  - Reviewed lifting restrictions; 4 weeks total  - Reviewed surgical pathology; CCC  - She wishes to follow up on as needed basis for now; Encouraged her to call in a few days if umbilical wound erythema worsened, developed fever, severe pain, drainage   -- Lynden Oxford, PA-C [facility] 01/19/2024, 2:03 PM M-F: 7am - 4pm

## 2024-01-19 NOTE — Patient Instructions (Signed)

## 2024-01-19 NOTE — Care Plan (Signed)
 ok

## 2024-01-24 ENCOUNTER — Inpatient Hospital Stay (HOSPITAL_COMMUNITY)
Admission: RE | Admit: 2024-01-24 | Discharge: 2024-01-24 | Disposition: A | Discharge status: Home / Self Care | Payer: Medicaid Other | Source: Ambulatory Visit | Attending: Orthopedic Surgery | Admitting: Orthopedic Surgery

## 2024-01-25 ENCOUNTER — Other Ambulatory Visit (HOSPITAL_COMMUNITY): Payer: Self-pay | Admitting: Family Medicine

## 2024-01-25 DIAGNOSIS — Z1231 Encounter for screening mammogram for malignant neoplasm of breast: Secondary | ICD-10-CM

## 2024-01-26 ENCOUNTER — Telehealth (HOSPITAL_COMMUNITY): Payer: Self-pay | Admitting: Occupational Therapy

## 2024-01-26 ENCOUNTER — Encounter: Payer: Self-pay | Admitting: Gastroenterology

## 2024-01-26 ENCOUNTER — Ambulatory Visit (HOSPITAL_COMMUNITY): Payer: Medicaid Other | Admitting: Occupational Therapy

## 2024-01-26 NOTE — Telephone Encounter (Signed)
 OT called regarding No Show to Evaluation. Left HIPAA appropriate VM asking her to call back and reschedule appointment or let the clinic know if she does not need therapy services anymore.   Trish Mage, OTR/L [facility] [phone]

## 2024-01-31 ENCOUNTER — Ambulatory Visit (HOSPITAL_COMMUNITY): Admission: RE | Admit: 2024-01-31 | Source: Ambulatory Visit

## 2024-02-01 ENCOUNTER — Encounter (HOSPITAL_COMMUNITY): Admitting: Occupational Therapy

## 2024-02-04 ENCOUNTER — Ambulatory Visit (HOSPITAL_COMMUNITY)
Admission: RE | Admit: 2024-02-04 | Discharge: 2024-02-04 | Disposition: A | Discharge status: Home / Self Care | Source: Ambulatory Visit | Attending: Orthopedic Surgery | Admitting: Orthopedic Surgery

## 2024-02-04 DIAGNOSIS — M19019 Primary osteoarthritis, unspecified shoulder: Secondary | ICD-10-CM

## 2024-02-04 DIAGNOSIS — G8929 Other chronic pain: Secondary | ICD-10-CM

## 2024-02-07 ENCOUNTER — Encounter (HOSPITAL_COMMUNITY): Admitting: Occupational Therapy

## 2024-02-07 ENCOUNTER — Other Ambulatory Visit (HOSPITAL_COMMUNITY)

## 2024-02-08 ENCOUNTER — Encounter (HOSPITAL_COMMUNITY): Admitting: Occupational Therapy

## 2024-02-11 ENCOUNTER — Encounter (HOSPITAL_COMMUNITY): Payer: Self-pay

## 2024-02-11 ENCOUNTER — Ambulatory Visit (HOSPITAL_COMMUNITY)

## 2024-02-20 ENCOUNTER — Encounter (HOSPITAL_COMMUNITY): Payer: Self-pay | Admitting: Radiology

## 2024-02-20 ENCOUNTER — Emergency Department (HOSPITAL_COMMUNITY)

## 2024-02-20 ENCOUNTER — Observation Stay (HOSPITAL_COMMUNITY)
Admission: EM | Admit: 2024-02-20 | Discharge: 2024-02-23 | Disposition: A | Discharge status: Home / Self Care | Attending: Internal Medicine | Admitting: Internal Medicine

## 2024-02-20 ENCOUNTER — Other Ambulatory Visit: Payer: Self-pay

## 2024-02-20 DIAGNOSIS — N3000 Acute cystitis without hematuria: Secondary | ICD-10-CM | POA: Diagnosis not present

## 2024-02-20 DIAGNOSIS — A419 Sepsis, unspecified organism: Secondary | ICD-10-CM | POA: Diagnosis not present

## 2024-02-20 DIAGNOSIS — Z79899 Other long term (current) drug therapy: Secondary | ICD-10-CM | POA: Insufficient documentation

## 2024-02-20 DIAGNOSIS — R0602 Shortness of breath: Secondary | ICD-10-CM | POA: Diagnosis present

## 2024-02-20 DIAGNOSIS — E876 Hypokalemia: Secondary | ICD-10-CM | POA: Insufficient documentation

## 2024-02-20 DIAGNOSIS — J449 Chronic obstructive pulmonary disease, unspecified: Secondary | ICD-10-CM | POA: Insufficient documentation

## 2024-02-20 DIAGNOSIS — J9601 Acute respiratory failure with hypoxia: Secondary | ICD-10-CM | POA: Insufficient documentation

## 2024-02-20 DIAGNOSIS — G2581 Restless legs syndrome: Secondary | ICD-10-CM | POA: Diagnosis not present

## 2024-02-20 DIAGNOSIS — Z794 Long term (current) use of insulin: Secondary | ICD-10-CM | POA: Diagnosis not present

## 2024-02-20 DIAGNOSIS — E785 Hyperlipidemia, unspecified: Secondary | ICD-10-CM | POA: Insufficient documentation

## 2024-02-20 DIAGNOSIS — J189 Pneumonia, unspecified organism: Principal | ICD-10-CM

## 2024-02-20 DIAGNOSIS — E1165 Type 2 diabetes mellitus with hyperglycemia: Principal | ICD-10-CM

## 2024-02-20 DIAGNOSIS — Z716 Tobacco abuse counseling: Secondary | ICD-10-CM

## 2024-02-20 LAB — BASIC METABOLIC PANEL WITH GFR
Anion gap: 11 (ref 5–15)
BUN: 5 mg/dL — ABNORMAL LOW (ref 6–20)
CO2: 34 mmol/L — ABNORMAL HIGH (ref 22–32)
Calcium: 8.9 mg/dL (ref 8.9–10.3)
Chloride: 86 mmol/L — ABNORMAL LOW (ref 98–111)
Creatinine, Ser: 0.51 mg/dL (ref 0.44–1.00)
GFR, Estimated: >60 mL/min (ref >60)
Glucose, Bld: 276 mg/dL — ABNORMAL HIGH (ref 70–99)
Potassium: 4 mmol/L (ref 3.5–5.1)
Sodium: 131 mmol/L — ABNORMAL LOW (ref 135–145)

## 2024-02-20 LAB — TROPONIN I (HIGH SENSITIVITY): Troponin I (High Sensitivity): 6 ng/L (ref <18)

## 2024-02-20 LAB — URINALYSIS, ROUTINE W REFLEX MICROSCOPIC
Bilirubin Urine: NEGATIVE
Glucose, UA: 50 mg/dL — AB
Hgb urine dipstick: NEGATIVE
Ketones, ur: NEGATIVE mg/dL
Leukocytes,Ua: NEGATIVE
Nitrite: POSITIVE — AB
Protein, ur: 100 mg/dL — AB
Specific Gravity, Urine: 1.018 (ref 1.005–1.030)
pH: 6 (ref 5.0–8.0)

## 2024-02-20 LAB — CBC
HCT: 39.2 % (ref 36.0–46.0)
Hemoglobin: 12.6 g/dL (ref 12.0–15.0)
MCH: 26.4 pg (ref 26.0–34.0)
MCHC: 32.1 g/dL (ref 30.0–36.0)
MCV: 82 fL (ref 80.0–100.0)
Platelets: 723 10*3/uL — ABNORMAL HIGH (ref 150–400)
RBC: 4.78 MIL/uL (ref 3.87–5.11)
RDW: 12.3 % (ref 11.5–15.5)
WBC: 19.6 10*3/uL — ABNORMAL HIGH (ref 4.0–10.5)
nRBC: 0 % (ref 0.0–0.2)

## 2024-02-20 LAB — I-STAT CG4 LACTIC ACID, ED: Lactic Acid, Venous: 2 mmol/L (ref 0.5–1.9)

## 2024-02-20 LAB — HCG, SERUM, QUALITATIVE: Preg, Serum: NEGATIVE

## 2024-02-20 MED ORDER — KETOROLAC TROMETHAMINE 15 MG/ML IJ SOLN
15.0000 mg | Freq: Once | INTRAMUSCULAR | Status: AC
  Administered 2024-02-20: 15 mg via INTRAVENOUS
  Filled 2024-02-20: qty 1

## 2024-02-20 MED ORDER — SODIUM CHLORIDE 0.9 % IV BOLUS
1000.0000 mL | Freq: Once | INTRAVENOUS | Status: AC
  Administered 2024-02-20: 1000 mL via INTRAVENOUS

## 2024-02-20 MED ORDER — ALBUTEROL SULFATE HFA 108 (90 BASE) MCG/ACT IN AERS: 2.0000 | INHALATION_SPRAY | RESPIRATORY_TRACT | Status: DC | PRN

## 2024-02-20 MED ORDER — SODIUM CHLORIDE 0.9 % IV SOLN
500.0000 mg | Freq: Once | INTRAVENOUS | Status: AC
  Administered 2024-02-20: 500 mg via INTRAVENOUS
  Filled 2024-02-20: qty 5

## 2024-02-20 MED ORDER — SODIUM CHLORIDE 0.9 % IV SOLN
2.0000 g | Freq: Once | INTRAVENOUS | Status: AC
  Administered 2024-02-20: 2 g via INTRAVENOUS
  Filled 2024-02-20: qty 20

## 2024-02-20 MED ORDER — SODIUM CHLORIDE 0.9 % IV SOLN
1.0000 g | Freq: Once | INTRAVENOUS | Status: AC
  Administered 2024-02-20: 1 g via INTRAVENOUS
  Filled 2024-02-20: qty 10

## 2024-02-20 MED ORDER — ACETAMINOPHEN 500 MG PO TABS
1000.0000 mg | ORAL_TABLET | Freq: Once | ORAL | Status: AC
  Administered 2024-02-20: 1000 mg via ORAL
  Filled 2024-02-20: qty 2

## 2024-02-20 MED ORDER — LACTATED RINGERS IV BOLUS
1000.0000 mL | Freq: Once | INTRAVENOUS | Status: AC
  Administered 2024-02-21: 1000 mL via INTRAVENOUS

## 2024-02-20 MED ORDER — PROCHLORPERAZINE MALEATE 5 MG PO TABS
10.0000 mg | ORAL_TABLET | Freq: Once | ORAL | Status: AC
Start: 2024-02-20 — End: 2024-02-20
  Administered 2024-02-20: 10 mg via ORAL
  Filled 2024-02-20: qty 2

## 2024-02-20 MED ORDER — LACTATED RINGERS IV SOLN: INTRAVENOUS | Status: DC

## 2024-02-20 MED ORDER — MORPHINE SULFATE (PF) 4 MG/ML IV SOLN
4.0000 mg | Freq: Once | INTRAVENOUS | Status: AC
  Administered 2024-02-21: 4 mg via INTRAVENOUS
  Filled 2024-02-20: qty 1

## 2024-02-20 NOTE — ED Provider Notes (Signed)
 [place] EMERGENCY DEPARTMENT AT [facility] Provider Note   CSN: [id number] Arrival date & time: 02/20/24  2155     History {Add pertinent medical, surgical, social history, OB history to HPI:1} Chief Complaint  Patient presents with   Chest Pain   Shortness of Breath    [name] is a 58 y.o. female.  Patient with past medical history significant for COPD, type II DM, GERD, home oxygen use presents to the emergency department complaining of chest discomfort with shortness of breath.  She states that chest pain began 2 days ago and worsened throughout the day today.  She has been coughing up sputum and is concerned that she may have developed a pneumonia.  She denies abdominal pain, nausea, vomiting.  She does endorse a headache which feels similar to her normal migraines.  She takes Imitrex at home for migraine therapy.   Chest Pain Associated symptoms: shortness of breath   Shortness of Breath Associated symptoms: chest pain        Home Medications Prior to Admission medications   Medication Sig Start Date End Date Taking? Authorizing Provider  acetaminophen (TYLENOL) 500 MG tablet Take 500 mg by mouth every 6 (six) hours as needed for fever or mild pain.   Yes [provider]  albuterol (PROVENTIL) (2.5 MG/3ML) 0.083% nebulizer solution Take 3 mLs (2.5 mg total) by nebulization every 6 (six) hours as needed for wheezing or shortness of breath. 03/13/22  Yes Emokpae, Courage, MD  albuterol (VENTOLIN HFA) 108 (90 Base) MCG/ACT inhaler Inhale 2 puffs into the lungs every 4 (four) hours as needed for wheezing or shortness of breath. 03/13/22  Yes Emokpae, Courage, MD  glipiZIDE (GLUCOTROL) 5 MG tablet Take 2 tablets (10 mg total) by mouth daily before breakfast. 04/23/23  Yes Tat, Onalee Hua, MD  metFORMIN (GLUCOPHAGE) 500 MG tablet Take 1 tablet (500 mg total) by mouth 2 (two) times daily with a meal. 04/23/23   Tat, Onalee Hua, MD  methocarbamol (ROBAXIN) 500 MG tablet  Take 500 mg by mouth every 8 (eight) hours as needed for muscle spasms. 08/13/21   [provider]  ondansetron (ZOFRAN-ODT) 4 MG disintegrating tablet 4mg  ODT q4 hours prn nausea/vomit Patient taking differently: Take 4 mg by mouth every 4 (four) hours as needed for nausea or vomiting. 4mg  ODT q4 hours prn nausea/vomit 04/19/22   Bethann Berkshire, MD  OZEMPIC, 0.25 OR 0.5 MG/DOSE, 2 MG/3ML SOPN Inject 0.5 mg into the skin once a week. Tuesdays. 12/28/23   [provider]  pantoprazole (PROTONIX) 40 MG tablet Take 1 tablet (40 mg total) by mouth daily. 03/13/22   Shon Hale, MD  rOPINIRole (REQUIP) 1 MG tablet Take 1 mg by mouth at bedtime. 03/09/23   [provider]  rosuvastatin (CRESTOR) 40 MG tablet Take 40 mg by mouth daily. 08/17/23   [provider]  SUMAtriptan (IMITREX) 50 MG tablet Take 50 mg by mouth as needed for migraine or headache. 11/29/21   [provider]  SYMBICORT 80-4.5 MCG/ACT inhaler Inhale 2 puffs into the lungs 2 (two) times daily. 12/28/23   [provider]  ARIPiprazole (ABILIFY) 5 MG tablet Take 5 mg by mouth daily.    02/03/12  [provider]  venlafaxine (EFFEXOR) 75 MG tablet Take 75 mg by mouth daily.    02/03/12  [provider]      Allergies    Levofloxacin, Penicillins, and Doxycycline    Review of Systems   Review of  Systems  Respiratory:  Positive for shortness of breath.   Cardiovascular:  Positive for chest pain.    Physical Exam Updated Vital Signs BP 122/75   Pulse (!) 121   Temp (!) 100.9 F (38.3 C) (Oral)   Resp (!) 31   Ht 5' (1.524 m)   Wt 60.8 kg   SpO2 96%   BMI 26.17 kg/m  Physical Exam Vitals and nursing note reviewed.  Constitutional:      General: She is not in acute distress.    Appearance: She is well-developed.  HENT:     Head: Normocephalic and atraumatic.  Eyes:     Conjunctiva/sclera: Conjunctivae normal.  Cardiovascular:     Rate and Rhythm: Regular  rhythm. Tachycardia present.  Pulmonary:     Effort: Pulmonary effort is normal. No respiratory distress.     Breath sounds: Rhonchi present.  Chest:     Chest wall: No tenderness.  Abdominal:     Palpations: Abdomen is soft.     Tenderness: There is no abdominal tenderness.  Musculoskeletal:        General: No swelling.     Cervical back: Neck supple.     Right lower leg: No edema.     Left lower leg: No edema.  Skin:    General: Skin is warm and dry.     Capillary Refill: Capillary refill takes less than 2 seconds.  Neurological:     Mental Status: She is alert.  Psychiatric:        Mood and Affect: Mood normal.     ED Results / Procedures / Treatments   Labs (all labs ordered are listed, but only abnormal results are displayed) Labs Reviewed  BASIC METABOLIC PANEL WITH GFR - Abnormal; Notable for the following components:      Result Value   Sodium 131 (*)    Chloride 86 (*)    CO2 34 (*)    Glucose, Bld 276 (*)    BUN 5 (*)    All other components within normal limits  CBC - Abnormal; Notable for the following components:   WBC 19.6 (*)    Platelets 723 (*)    All other components within normal limits  URINALYSIS, ROUTINE W REFLEX MICROSCOPIC - Abnormal; Notable for the following components:   Color, Urine AMBER (*)    APPearance HAZY (*)    Glucose, UA 50 (*)    Protein, ur 100 (*)    Nitrite POSITIVE (*)    Bacteria, UA MANY (*)    All other components within normal limits  I-STAT CG4 LACTIC ACID, ED - Abnormal; Notable for the following components:   Lactic Acid, Venous 2.0 (*)    All other components within normal limits  CULTURE, BLOOD (ROUTINE X 2)  CULTURE, BLOOD (ROUTINE X 2)  RESP PANEL BY RT-PCR (RSV, FLU A&B, COVID)  RVPGX2  HCG, SERUM, QUALITATIVE  COMPREHENSIVE METABOLIC PANEL WITH GFR  PROTIME-INR  APTT  URINALYSIS, W/ REFLEX TO CULTURE (INFECTION SUSPECTED)  I-STAT CG4 LACTIC ACID, ED  I-STAT CG4 LACTIC ACID, ED  TROPONIN I (HIGH  SENSITIVITY)  TROPONIN I (HIGH SENSITIVITY)    EKG None  Radiology DG Chest 2 View Result Date: 02/20/2024 CLINICAL DATA:  Shortness of breath, chest pain EXAM: CHEST - 2 VIEW COMPARISON:  01/04/2024 FINDINGS: Heart and mediastinal contours are within normal limits. Reticulonodular interstitial prominence throughout the lungs. No effusions. No acute bony abnormality. IMPRESSION: Diffuse reticulonodular interstitial opacities throughout the lungs. This may  reflect atypical infection. Electronically Signed   By: Charlett Nose M.D.   On: 02/20/2024 22:26    Procedures .Critical Care  Performed by: Darrick Grinder, PA-C Authorized by: Darrick Grinder, PA-C   Critical care provider statement:    Critical care time (minutes):  30   Critical care time was exclusive of:  Separately billable procedures and treating other patients   Critical care was necessary to treat or prevent imminent or life-threatening deterioration of the following conditions:  Sepsis   Critical care was time spent personally by me on the following activities:  Development of treatment plan with patient or surrogate, discussions with consultants, evaluation of patient's response to treatment, examination of patient, ordering and review of laboratory studies, ordering and review of radiographic studies, ordering and performing treatments and interventions, pulse oximetry, re-evaluation of patient's condition and review of old charts   Care discussed with: admitting provider     {Document cardiac monitor, telemetry assessment procedure when appropriate:1}  Medications Ordered in ED Medications  albuterol (VENTOLIN HFA) 108 (90 Base) MCG/ACT inhaler 2 puff (has no administration in time range)  lactated ringers infusion ( Intravenous New Bag/Given 02/20/24 2318)  cefTRIAXone (ROCEPHIN) 2 g in sodium chloride 0.9 % 100 mL IVPB (2 g Intravenous New Bag/Given 02/20/24 2313)  azithromycin (ZITHROMAX) 500 mg in sodium chloride  0.9 % 250 mL IVPB (500 mg Intravenous New Bag/Given 02/20/24 2320)  cefTRIAXone (ROCEPHIN) 1 g in sodium chloride 0.9 % 100 mL IVPB (1 g Intravenous New Bag/Given 02/20/24 2312)  azithromycin (ZITHROMAX) 500 mg in sodium chloride 0.9 % 250 mL IVPB (500 mg Intravenous New Bag/Given 02/20/24 2319)  sodium chloride 0.9 % bolus 1,000 mL (1,000 mLs Intravenous New Bag/Given 02/20/24 2246)  acetaminophen (TYLENOL) tablet 1,000 mg (1,000 mg Oral Given 02/20/24 2244)  ketorolac (TORADOL) 15 MG/ML injection 15 mg (15 mg Intravenous Given 02/20/24 2244)  prochlorperazine (COMPAZINE) tablet 10 mg (10 mg Oral Given 02/20/24 2247)    ED Course/ Medical Decision Making/ A&P   {   Click here for ABCD2, HEART and other calculatorsREFRESH Note before signing :1}                              Medical Decision Making Risk OTC drugs. Prescription drug management.   This patient presents to the ED for concern of chest pain/shortness of breath, this involves an extensive number of treatment options, and is a complaint that carries with it a high risk of complications and morbidity.  The differential diagnosis includes pneumonia, ACS, viral infection, pleurisy, others   Co morbidities that complicate the patient evaluation  Type II DM, COPD, recent influenza infection   Additional history obtained:  Additional history obtained from EMS External records from outside source obtained and reviewed including chest x-ray from February 11, discharge summary from February 14   Lab Tests:  I Ordered, and personally interpreted labs.  The pertinent results include: Initial lactic acid 2.0, white count of 19,600, UA nitrite positive with many bacteria, initial troponin 6   Imaging Studies ordered:  I ordered imaging studies including chest x-ray I independently visualized and interpreted imaging which showed  Diffuse reticulonodular interstitial opacities throughout the lungs.  This may reflect atypical infection.    I agree with the radiologist interpretation   Cardiac Monitoring: / EKG:  The patient was maintained on a cardiac monitor.  I personally viewed and interpreted the cardiac monitored which showed  an underlying rhythm of: Sinus tachycardia   Consultations Obtained:  I requested consultation with the hospitalist,  and discussed lab and imaging findings as well as pertinent plan - they recommend: ***   Problem List / ED Course / Critical interventions / Medication management   I ordered medication including Compazine, Tylenol, Toradol for headache/fever, Zithromax and Rocephin for sepsis, likely pneumonia, and UTI, LR infusion Reevaluation of the patient after these medicines showed that the patient improved I have reviewed the patients home medicines and have made adjustments as needed   Social Determinants of Health:  Patient is a daily tobacco smoker   Test / Admission - Considered:  ***   {Document critical care time when appropriate:1} {Document review of labs and clinical decision tools ie heart score, Chads2Vasc2 etc:1}  {Document your independent review of radiology images, and any outside records:1} {Document your discussion with family members, caretakers, and with consultants:1} {Document social determinants of health affecting pt's care:1} {Document your decision making why or why not admission, treatments were needed:1} Final Clinical Impression(s) / ED Diagnoses Final diagnoses:  None    Rx / DC Orders ED Discharge Orders     None

## 2024-02-20 NOTE — Progress Notes (Signed)
 Pt being followed by [facility] for Sepsis protocol.

## 2024-02-20 NOTE — ED Triage Notes (Signed)
 Pt had flu last week and was feeling better and today she began having chest pain and shortness of breath. Chest pain started 2 days ago and got worse today. Pt believes she now has pneumonia.   376 BS 96% 4L 100/65 120

## 2024-02-21 ENCOUNTER — Encounter (HOSPITAL_COMMUNITY): Payer: Self-pay | Admitting: Internal Medicine

## 2024-02-21 DIAGNOSIS — Z716 Tobacco abuse counseling: Secondary | ICD-10-CM

## 2024-02-21 DIAGNOSIS — J189 Pneumonia, unspecified organism: Secondary | ICD-10-CM | POA: Diagnosis present

## 2024-02-21 DIAGNOSIS — A419 Sepsis, unspecified organism: Secondary | ICD-10-CM | POA: Diagnosis not present

## 2024-02-21 HISTORY — DX: Tobacco abuse counseling: Z71.6

## 2024-02-21 LAB — RESP PANEL BY RT-PCR (RSV, FLU A&B, COVID)  RVPGX2
Influenza A by PCR: NEGATIVE
Influenza B by PCR: NEGATIVE
Resp Syncytial Virus by PCR: NEGATIVE
SARS Coronavirus 2 by RT PCR: NEGATIVE

## 2024-02-21 LAB — RESPIRATORY PANEL BY PCR

## 2024-02-21 LAB — BASIC METABOLIC PANEL WITH GFR
Anion gap: 7 (ref 5–15)
BUN: 5 mg/dL — ABNORMAL LOW (ref 6–20)
CO2: 33 mmol/L — ABNORMAL HIGH (ref 22–32)
Calcium: 8.6 mg/dL — ABNORMAL LOW (ref 8.9–10.3)
Chloride: 96 mmol/L — ABNORMAL LOW (ref 98–111)
Creatinine, Ser: 0.47 mg/dL (ref 0.44–1.00)
GFR, Estimated: >60 mL/min (ref >60)
Glucose, Bld: 280 mg/dL — ABNORMAL HIGH (ref 70–99)
Potassium: 3.7 mmol/L (ref 3.5–5.1)
Sodium: 136 mmol/L (ref 135–145)

## 2024-02-21 LAB — COMPREHENSIVE METABOLIC PANEL WITH GFR
ALT: 8 U/L (ref 0–44)
AST: 13 U/L — ABNORMAL LOW (ref 15–41)
Albumin: 2.2 g/dL — ABNORMAL LOW (ref 3.5–5.0)
Alkaline Phosphatase: 95 U/L (ref 38–126)
Anion gap: 10 (ref 5–15)
BUN: <5 mg/dL — ABNORMAL LOW (ref 6–20)
CO2: 32 mmol/L (ref 22–32)
Calcium: 8.1 mg/dL — ABNORMAL LOW (ref 8.9–10.3)
Chloride: 91 mmol/L — ABNORMAL LOW (ref 98–111)
Creatinine, Ser: 0.46 mg/dL (ref 0.44–1.00)
GFR, Estimated: >60 mL/min
Glucose, Bld: 220 mg/dL — ABNORMAL HIGH (ref 70–99)
Potassium: 3.4 mmol/L — ABNORMAL LOW (ref 3.5–5.1)
Sodium: 133 mmol/L — ABNORMAL LOW (ref 135–145)
Total Bilirubin: 0.2 mg/dL (ref 0.0–1.2)
Total Protein: 6.9 g/dL (ref 6.5–8.1)

## 2024-02-21 LAB — CBC
HCT: 33.9 % — ABNORMAL LOW (ref 36.0–46.0)
Hemoglobin: 10.6 g/dL — ABNORMAL LOW (ref 12.0–15.0)
MCH: 26.1 pg (ref 26.0–34.0)
MCHC: 31.3 g/dL (ref 30.0–36.0)
MCV: 83.5 fL (ref 80.0–100.0)
Platelets: 575 10*3/uL — ABNORMAL HIGH (ref 150–400)
RBC: 4.06 MIL/uL (ref 3.87–5.11)
RDW: 12.5 % (ref 11.5–15.5)
WBC: 16.6 10*3/uL — ABNORMAL HIGH (ref 4.0–10.5)
nRBC: 0 % (ref 0.0–0.2)

## 2024-02-21 LAB — APTT: aPTT: 24 s (ref 24–36)

## 2024-02-21 LAB — GLUCOSE, CAPILLARY
Glucose-Capillary: 253 mg/dL — ABNORMAL HIGH (ref 70–99)
Glucose-Capillary: 286 mg/dL — ABNORMAL HIGH (ref 70–99)
Glucose-Capillary: 253 mg/dL — ABNORMAL HIGH (ref 70–99)
Glucose-Capillary: 259 mg/dL — ABNORMAL HIGH (ref 70–99)

## 2024-02-21 LAB — MRSA NEXT GEN BY PCR, NASAL: MRSA by PCR Next Gen: NOT DETECTED

## 2024-02-21 LAB — I-STAT CG4 LACTIC ACID, ED: Lactic Acid, Venous: 1.2 mmol/L (ref 0.5–1.9)

## 2024-02-21 LAB — EXPECTORATED SPUTUM ASSESSMENT W GRAM STAIN, RFLX TO RESP C

## 2024-02-21 LAB — PROTIME-INR
INR: 1.1 (ref 0.8–1.2)
Prothrombin Time: 14.5 s (ref 11.4–15.2)

## 2024-02-21 LAB — MAGNESIUM: Magnesium: 1.5 mg/dL — ABNORMAL LOW (ref 1.7–2.4)

## 2024-02-21 LAB — PHOSPHORUS: Phosphorus: 2.7 mg/dL (ref 2.5–4.6)

## 2024-02-21 LAB — TROPONIN I (HIGH SENSITIVITY): Troponin I (High Sensitivity): 5 ng/L (ref <18)

## 2024-02-21 MED ORDER — OXYCODONE-ACETAMINOPHEN 5-325 MG PO TABS
1.0000 | ORAL_TABLET | Freq: Four times a day (QID) | ORAL | Status: DC | PRN
  Administered 2024-02-21 – 2024-02-23 (×8): 1 via ORAL
  Filled 2024-02-21 (×8): qty 1

## 2024-02-21 MED ORDER — SUMATRIPTAN SUCCINATE 50 MG PO TABS
50.0000 mg | ORAL_TABLET | ORAL | Status: DC | PRN
  Administered 2024-02-21 – 2024-02-22 (×2): 50 mg via ORAL
  Filled 2024-02-21 (×4): qty 1

## 2024-02-21 MED ORDER — PANTOPRAZOLE SODIUM 40 MG PO TBEC
40.0000 mg | DELAYED_RELEASE_TABLET | Freq: Every day | ORAL | Status: DC | PRN
  Administered 2024-02-21: 40 mg via ORAL
  Filled 2024-02-21: qty 1

## 2024-02-21 MED ORDER — INSULIN ASPART 100 UNIT/ML IJ SOLN
0.0000 [IU] | Freq: Three times a day (TID) | INTRAMUSCULAR | Status: DC
  Administered 2024-02-21 (×3): 3 [IU] via SUBCUTANEOUS
  Administered 2024-02-22: 5 [IU] via SUBCUTANEOUS

## 2024-02-21 MED ORDER — POTASSIUM CHLORIDE CRYS ER 20 MEQ PO TBCR
40.0000 meq | EXTENDED_RELEASE_TABLET | Freq: Once | ORAL | Status: AC
  Administered 2024-02-21: 40 meq via ORAL
  Filled 2024-02-21: qty 2

## 2024-02-21 MED ORDER — NICOTINE 14 MG/24HR TD PT24
14.0000 mg | MEDICATED_PATCH | Freq: Every day | TRANSDERMAL | Status: DC | PRN
  Administered 2024-02-23: 14 mg via TRANSDERMAL
  Filled 2024-02-21: qty 1

## 2024-02-21 MED ORDER — INSULIN GLARGINE-YFGN 100 UNIT/ML ~~LOC~~ SOLN
10.0000 [IU] | Freq: Every day | SUBCUTANEOUS | Status: DC
  Filled 2024-02-21: qty 0.1

## 2024-02-21 MED ORDER — ROPINIROLE HCL 1 MG PO TABS
2.0000 mg | ORAL_TABLET | Freq: Three times a day (TID) | ORAL | Status: DC
  Administered 2024-02-21 – 2024-02-23 (×7): 2 mg via ORAL
  Filled 2024-02-21 (×7): qty 2

## 2024-02-21 MED ORDER — AZITHROMYCIN 500 MG PO TABS
500.0000 mg | ORAL_TABLET | Freq: Every day | ORAL | Status: AC
  Administered 2024-02-21 – 2024-02-22 (×2): 500 mg via ORAL
  Filled 2024-02-21 (×2): qty 1

## 2024-02-21 MED ORDER — ROSUVASTATIN CALCIUM 20 MG PO TABS
40.0000 mg | ORAL_TABLET | Freq: Every day | ORAL | Status: DC
  Administered 2024-02-21 – 2024-02-23 (×3): 40 mg via ORAL
  Filled 2024-02-21 (×3): qty 2

## 2024-02-21 MED ORDER — INSULIN GLARGINE-YFGN 100 UNIT/ML ~~LOC~~ SOLN
10.0000 [IU] | Freq: Every day | SUBCUTANEOUS | Status: DC
  Administered 2024-02-21 – 2024-02-23 (×3): 10 [IU] via SUBCUTANEOUS
  Filled 2024-02-21 (×3): qty 0.1

## 2024-02-21 MED ORDER — INSULIN ASPART 100 UNIT/ML IJ SOLN
0.0000 [IU] | Freq: Every day | INTRAMUSCULAR | Status: DC
  Administered 2024-02-21: 3 [IU] via SUBCUTANEOUS

## 2024-02-21 MED ORDER — ONDANSETRON HCL 4 MG/2ML IJ SOLN
4.0000 mg | Freq: Four times a day (QID) | INTRAMUSCULAR | Status: DC | PRN
  Administered 2024-02-21: 4 mg via INTRAVENOUS
  Filled 2024-02-21: qty 2

## 2024-02-21 MED ORDER — PREDNISONE 20 MG PO TABS
40.0000 mg | ORAL_TABLET | Freq: Every day | ORAL | Status: DC
  Administered 2024-02-21 – 2024-02-23 (×3): 40 mg via ORAL
  Filled 2024-02-21 (×3): qty 2

## 2024-02-21 MED ORDER — SODIUM CHLORIDE 0.9% FLUSH
3.0000 mL | Freq: Two times a day (BID) | INTRAVENOUS | Status: DC
  Administered 2024-02-21 – 2024-02-23 (×5): 3 mL via INTRAVENOUS

## 2024-02-21 MED ORDER — MOMETASONE FURO-FORMOTEROL FUM 100-5 MCG/ACT IN AERO
2.0000 | INHALATION_SPRAY | Freq: Two times a day (BID) | RESPIRATORY_TRACT | Status: DC
  Administered 2024-02-21 – 2024-02-22 (×3): 2 via RESPIRATORY_TRACT
  Filled 2024-02-21 (×2): qty 8.8

## 2024-02-21 MED ORDER — LACTATED RINGERS IV BOLUS
1000.0000 mL | Freq: Once | INTRAVENOUS | Status: AC
  Administered 2024-02-21: 1000 mL via INTRAVENOUS

## 2024-02-21 MED ORDER — CEFTRIAXONE SODIUM 2 G IJ SOLR
2.0000 g | INTRAMUSCULAR | Status: DC
  Administered 2024-02-21 – 2024-02-22 (×2): 2 g via INTRAVENOUS
  Filled 2024-02-21 (×2): qty 20

## 2024-02-21 MED ORDER — ALBUTEROL SULFATE (2.5 MG/3ML) 0.083% IN NEBU
2.5000 mg | INHALATION_SOLUTION | RESPIRATORY_TRACT | Status: DC | PRN
  Administered 2024-02-23: 2.5 mg via RESPIRATORY_TRACT
  Filled 2024-02-21: qty 3

## 2024-02-21 MED ORDER — IPRATROPIUM-ALBUTEROL 0.5-2.5 (3) MG/3ML IN SOLN
3.0000 mL | Freq: Four times a day (QID) | RESPIRATORY_TRACT | Status: DC
  Administered 2024-02-21 – 2024-02-22 (×6): 3 mL via RESPIRATORY_TRACT
  Filled 2024-02-21 (×6): qty 3

## 2024-02-21 MED ORDER — ENOXAPARIN SODIUM 40 MG/0.4ML IJ SOSY
40.0000 mg | PREFILLED_SYRINGE | INTRAMUSCULAR | Status: DC
  Administered 2024-02-21 – 2024-02-23 (×3): 40 mg via SUBCUTANEOUS
  Filled 2024-02-21 (×3): qty 0.4

## 2024-02-21 NOTE — Progress Notes (Addendum)
 Same day note  [name] is a 58 y.o. female with past medical history of COPD, chronic respiratory failure on 3 L of oxygen at baseline, current smoker at 1 pack a day, diabetes mellitus type 2, history of recent cholecystectomy last month presented hospital with cough and shortness of breath.  Her entire family was sick at home from flulike symptoms.  Patient also admitted to having cough with productive clear sputum shortness of breath and some runny nose.  In the ED patient was tachycardic with diffuse expiratory wheezes.  Labs showed lactate of 2.0, potassium of 3.4.  UA was abnormal.  This x-ray showed diffuse reticular nodular interstitial opacities.  EKG showed sinus tachycardia with RBBB.  In the ED patient received Rocephin and Zithromax 3 L of IV fluid and was admitted hospital for further evaluation and treatment.   Patient seen and examined at bedside.  Patient was admitted to the hospital for chest pain and shortness of breath  At the time of my evaluation, patient complains of shortness of breath, cough, congestion.  Feels a little better since the morning.  Physical examination reveals obese built female, on supplemental oxygen, diminished breath sounds bilaterally with coarse breath sounds noted.  Laboratory data and imaging was reviewed  Assessment and Plan.  Sepsis secondary to multifocal postviral bacterial pneumonia. COPD with chronic respiratory failure on 3 L of oxygen at home.  Had 1 week history of URI prior to the symptoms.  Patient was febrile in the ED with tachycardia and tachypnea with elevated lactate and infiltrate in the chest exercised with infection.  Continue Rocephin IV including Zithromax.  Initially received 3 L of IV fluids.  Hold off with further hydration.  Respiratory viral panel, COVID influenza RSV, MRSA negative so far.  Follow-up blood cultures.  Continue DuoNebs, incentive spirometry flutter valve.  Currently on 3 L of oxygen by nasal cannula. Add  prednisone 40 mg daily.  Diabetes mellitus with hyperglycemia.  Patient is on Lantus Ozempic metformin glipizide at home.  Continue long-acting and sliding scale insulin while in the hospital.  Diabetic coordinator on board. Will closely monitor on prednisone.   Hypokalemia.  Replaced.  Check levels in AM.  Hyperlipidemia continue statins.  Restless leg syndrome.  Continue ropinirole  History of migraines on triptan at home. Will resume.    No Charge  Signed,  Tenny Craw, MD [facility]

## 2024-02-21 NOTE — Progress Notes (Signed)
   02/21/24 0611  Vitals  Temp 98.8 F (37.1 C)  Temp Source Oral  BP 130/81  MAP (mmHg) 95  BP Location Left Arm  BP Method Automatic  Patient Position (if appropriate) Lying  Pulse Rate Source Monitor  ECG Heart Rate (!) 108  Resp 20  MEWS COLOR  MEWS Score Color Green  Oxygen Therapy  SpO2 93 %  O2 Device Nasal Cannula  O2 Flow Rate (L/min) 3 L/min  MEWS Score  MEWS Temp 0  MEWS Systolic 0  MEWS Pulse 1  MEWS RR 0  MEWS LOC 0  MEWS Score 1   Received pt from ED. VSS. No complaints or needs at this time.

## 2024-02-21 NOTE — Inpatient Diabetes Management (Signed)
 Inpatient Diabetes Program Recommendations  AACE/ADA: New Consensus Statement on Inpatient Glycemic Control (2015)  Target Ranges:  Prepandial:   less than 140 mg/dL      Peak postprandial:   less than 180 mg/dL (1-2 hours)      Critically ill patients:  140 - 180 mg/dL   Lab Results  Component Value Date   GLUCAP 286 (H) 02/21/2024   HGBA1C 10.9 (H) 01/04/2024    Latest Reference Range & Units 02/21/24 06:15 02/21/24 11:46  Glucose-Capillary 70 - 99 mg/dL 518 (H) 841 (H)  (H): Data is abnormally high  History: DM   Home DM Meds:  Lantus 10 units daily     Glipizide 10 mg QAM      Metformin 500 mg BID      Ozempic 1.0 mg Qweek (Tuesday)    Current Orders:   Novolog Moderate Correction Scale/ SSI (0-6 units) TID AC + HS 0-5                          Semglee 10 units at bedtime  Inpatient Diabetes Program Recommendations:   DM coordinator spoke with patient regarding A1c on prior admission 01/07/24. Patient has since started on Insulin. Please consider: -Giving Lantus now instead of waiting until hs  Thank you, Darel Hong E. Krishay Faro, RN, MSN, CDCES  Diabetes Coordinator Inpatient Glycemic Control Team Team Pager [phone] (8am-5pm) 02/21/2024 12:06 PM

## 2024-02-21 NOTE — Hospital Course (Signed)
 Same day note  [name] is a 58 y.o. female with past medical history of COPD, chronic respiratory failure on 3 L of oxygen at baseline, current smoker at 1 pack a day, diabetes mellitus type 2, history of recent cholecystectomy last month presented hospital with cough and shortness of breath.  Her entire family was sick at home from flulike symptoms.  Patient also admitted to having cough with productive clear sputum shortness of breath and some runny nose.  In the ED patient was tachycardic with diffuse expiratory wheezes.  Labs showed lactate of 2.0, potassium of 3.4.  UA was abnormal.  This x-ray showed diffuse reticular nodular interstitial opacities.  EKG showed sinus tachycardia with RBBB.  In the ED patient received Rocephin and Zithromax 3 L of IV fluid and was admitted hospital for further evaluation and treatment.   Patient seen and examined at bedside.  Patient was admitted to the hospital for chest pain and shortness of breath  At the time of my evaluation, patient complains of  Physical examination reveals  Laboratory data and imaging was reviewed  Assessment and Plan.  Sepsis secondary to multifocal postviral bacterial pneumonia. COPD with chronic respiratory failure on 3 L of oxygen at home.  Had 1 week history of URI prior to the symptoms.  Patient was febrile in the ED with tachycardia and tachypnea with elevated lactate and infiltrate in the chest exercised with infection.  Continue Rocephin IV including Zithromax.  Initially received 3 L of IV fluids.  Hold off with further hydration.  Respiratory viral panel, COVID influenza RSV, MRSA negative so far.  Follow-up blood cultures.  Continue DuoNebs incentive spirometry flutter valve.  Currently on 3 L of oxygen by nasal cannula.  Diabetes mellitus with hyperglycemia.  Patient is on Lantus Ozempic metformin glipizide at home.  Continue long-acting and sliding scale insulin while in the hospital.  Hypokalemia.  Replaced.   Check levels in AM.  Hyperlipidemia continue statins.  Restless leg syndrome.  Continue ropinirole  History of migraines on triptan at home.    No Charge  Signed,  Tenny Craw, MD [facility]

## 2024-02-21 NOTE — H&P (Signed)
 History and Physical    [name]:[id number] DOB: [birth year] DOA: 02/20/2024  PCP: No primary care provider on file.   Patient coming from: Home   Chief Complaint:  Chief Complaint  Patient presents with   Chest Pain   Shortness of Breath    HPI:  [name] is a 58 y.o. 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  Reports that her entire family was sick with what they thought was a flulike illness last week.  Symptoms including cough productive of clear sputum, shortness of breath, runny nose.  Over the past few days her symptoms have worsened sputum has become lime green in color.  She is developed more shortness of breath as well as chest tightness and pressure.  Having chills but no measured fevers at home.  Has reduced smoking during her acute illness.   Review of Systems:  ROS complete and negative except as marked above   Allergies  Allergen Reactions   Levofloxacin Anaphylaxis    Arm also turned red where IV was place   Penicillins Other (See Comments)    convulsions   Doxycycline Nausea And Vomiting    Prior to Admission medications   Medication Sig Start Date End Date Taking? Authorizing Provider  acetaminophen (TYLENOL) 500 MG tablet Take 500 mg by mouth every 6 (six) hours as needed for fever or mild pain.   Yes [provider]  albuterol (PROVENTIL) (2.5 MG/3ML) 0.083% nebulizer solution Take 3 mLs (2.5 mg total) by nebulization every 6 (six) hours as needed for wheezing or shortness of breath. 03/13/22  Yes Emokpae, Courage, MD  albuterol (VENTOLIN HFA) 108 (90 Base) MCG/ACT inhaler Inhale 2 puffs into the lungs every 4 (four) hours as needed for wheezing or shortness of breath. 03/13/22  Yes Emokpae, Courage, MD  glipiZIDE (GLUCOTROL) 5 MG tablet Take 2  tablets (10 mg total) by mouth daily before breakfast. 04/23/23  Yes Tat, Onalee Hua, MD  LANTUS SOLOSTAR 100 UNIT/ML Solostar Pen Inject 10 Units into the skin at bedtime. 01/25/24  Yes [provider]  metFORMIN (GLUCOPHAGE) 500 MG tablet Take 1 tablet (500 mg total) by mouth 2 (two) times daily with a meal. Patient taking differently: Take 500 mg by mouth daily as needed (for BG greater than 200). 04/23/23  Yes Tat, Onalee Hua, MD  methocarbamol (ROBAXIN) 500 MG tablet Take 500 mg by mouth every 8 (eight) hours as needed for muscle spasms. 08/13/21  Yes [provider]  ondansetron (ZOFRAN-ODT) 4 MG disintegrating tablet 4mg  ODT q4 hours prn nausea/vomit Patient taking differently: Take 4 mg by mouth every 4 (four) hours as needed for nausea or vomiting. 4mg  ODT q4 hours prn nausea/vomit 04/19/22  Yes Bethann Berkshire, MD  OZEMPIC, 0.25 OR 0.5 MG/DOSE, 2 MG/3ML SOPN Inject 1 mg into the skin every Wednesday. 12/28/23  Yes [provider]  pantoprazole (PROTONIX) 40 MG tablet Take 1 tablet (40 mg total) by mouth daily. Patient taking differently: Take 40 mg by mouth daily as needed (for acid reflux). 03/13/22  Yes Emokpae, Courage, MD  rOPINIRole (REQUIP) 2 MG tablet Take 2 mg by mouth 3 (three) times daily. 03/09/23  Yes [provider]  rosuvastatin (CRESTOR) 40 MG tablet Take 40 mg by mouth daily. 08/17/23  Yes [provider]  SUMAtriptan (IMITREX) 50 MG tablet Take 50 mg by mouth as needed for migraine or headache. 11/29/21  Yes [provider]  SYMBICORT 80-4.5 MCG/ACT inhaler Inhale 2 puffs into the lungs 2 (two) times daily. 12/28/23  Yes [provider]  ARIPiprazole (ABILIFY) 5 MG tablet Take 5 mg by mouth daily.    02/03/12  [provider]  venlafaxine (EFFEXOR) 75 MG tablet Take 75 mg by mouth daily.    02/03/12  [provider]    Past Medical History:  Diagnosis Date   Angina    Anxiety state 10/15/2015   ARDS (adult  respiratory distress syndrome) (HCC)    Jan 2011   Asthma    Chronic back pain    COPD (chronic obstructive pulmonary disease) (HCC)    Diabetes mellitus    Gallstones with biliary obstruction    GERD (gastroesophageal reflux disease) 12/21/2012   HTN (hypertension) 10/15/2015   Hyperglycemia, drug-induced    steroid induced hyperglycemia   Migraine headache    On home O2    Pneumonia    Recurrent upper respiratory infection (URI)    Shortness of breath     Past Surgical History:  Procedure Laterality Date   BILIARY DILATION  01/04/2024   Procedure: BILIARY DILATION;  Surgeon: Midge Minium, MD;  Location: [facility] ENDOSCOPY;  Service: Endoscopy;;   c-section     ENDOSCOPIC RETROGRADE CHOLANGIOPANCREATOGRAPHY (ERCP) WITH PROPOFOL N/A 01/04/2024   Procedure: ENDOSCOPIC RETROGRADE CHOLANGIOPANCREATOGRAPHY (ERCP) WITH PROPOFOL;  Surgeon: Midge Minium, MD;  Location: [facility] ENDOSCOPY;  Service: Endoscopy;  Laterality: N/A;   REMOVAL OF STONES  01/04/2024   Procedure: REMOVAL OF STONES;  Surgeon: Midge Minium, MD;  Location: [facility] ENDOSCOPY;  Service: Endoscopy;;   TRACHEOSTOMY     decannulated 12/2009   TUBAL LIGATION     uterine ablasion       reports that she has been smoking cigarettes. She has a 21 pack-year smoking history. She has never used smokeless tobacco. She reports that she does not drink alcohol and does not use drugs.  Family History  Problem Relation Age of Onset   Coronary artery disease Brother    Diabetes Other    Cancer Other    Hypertension Other      Physical Exam: Vitals:   02/21/24 0015 02/21/24 0100 02/21/24 0215 02/21/24 0258  BP: 107/74 118/74 104/81 123/73  Pulse: (!) 114 (!) 113 (!) 117 (!) 110  Resp: 18 (!) 23 (!) 23 19  Temp:   98.3 F (36.8 C) 97.7 F (36.5 C)  TempSrc:   Oral Axillary  SpO2: 98% 98% 97% 100%  Weight:      Height:        Gen: Awake, alert, NAD   CV: Regular, tachycardic, normal S1, S2, no murmurs  Resp: Normal WOB, on [place],  diffuse expiratory wheezes, with scattered rales and rhonchi. Abd: Flat, normoactive, nontender MSK: Symmetric, no edema  Skin: No rashes or lesions to exposed skin  Neuro: Alert and interactive  Psych: euthymic, appropriate    Data review:   Labs reviewed, notable for:   Lactate 2 -> 1.2 K3.4, Glucose 220 UA appears contaminated  Micro:  Results for orders placed or performed during the hospital encounter of 02/20/24  Resp panel by RT-PCR (RSV, Flu A&B, Covid)     Status: None   Collection Time: 02/20/24 10:44 PM   Specimen: Nasal Swab  Result Value Ref Range Status   SARS Coronavirus 2 by RT PCR NEGATIVE NEGATIVE Final  Influenza A by PCR NEGATIVE NEGATIVE Final   Influenza B by PCR NEGATIVE NEGATIVE Final    Comment: (NOTE) The Xpert Xpress SARS-CoV-2/FLU/RSV plus assay is intended as an aid in the diagnosis of influenza from Nasopharyngeal swab specimens and should not be used as a sole basis for treatment. Nasal washings and aspirates are unacceptable for Xpert Xpress SARS-CoV-2/FLU/RSV testing.  Fact Sheet for Patients: BloggerCourse.com  Fact Sheet for Healthcare Providers: SeriousBroker.it  This test is not yet approved or cleared by the [place] FDA and has been authorized for detection and/or diagnosis of SARS-CoV-2 by FDA under an Emergency Use Authorization (EUA). This EUA will remain in effect (meaning this test can be used) for the duration of the COVID-19 declaration under Section 564(b)(1) of the Act, 21 U.S.C. section 360bbb-3(b)(1), unless the authorization is terminated or revoked.     Resp Syncytial Virus by PCR NEGATIVE NEGATIVE Final    Comment: (NOTE) Fact Sheet for Patients: BloggerCourse.com  Fact Sheet for Healthcare Providers: SeriousBroker.it  This test is not yet approved or cleared by the [place] FDA and has been  authorized for detection and/or diagnosis of SARS-CoV-2 by FDA under an Emergency Use Authorization (EUA). This EUA will remain in effect (meaning this test can be used) for the duration of the COVID-19 declaration under Section 564(b)(1) of the Act, 21 U.S.C. section 360bbb-3(b)(1), unless the authorization is terminated or revoked.  Performed at [facility], [address] [place], [place] [address]     Imaging reviewed:  DG Chest 2 View Result Date: 02/20/2024 CLINICAL DATA:  Shortness of breath, chest pain EXAM: CHEST - 2 VIEW COMPARISON:  01/04/2024 FINDINGS: Heart and mediastinal contours are within normal limits. Reticulonodular interstitial prominence throughout the lungs. No effusions. No acute bony abnormality. IMPRESSION: Diffuse reticulonodular interstitial opacities throughout the lungs. This may reflect atypical infection. Electronically Signed   By: Charlett Nose M.D.   On: 02/20/2024 22:26    EKG:  Personally reviewed sinus tachycardia, RBBB, no acute ischemic changes.  ED Course:  Treated with ceftriaxone, azithromycin, 3 L IV fluid and started on 150 cc an hour.  Otherwise treated with Tylenol, Toradol, morphine, Compazine.   Assessment/Plan:  58 y.o. 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  Admitted for sepsis related to community-acquired pneumonia.  Community-acquired pneumonia, multifocal atypical pattern Possible postviral bacterial pneumonia Sepsis secondary to above, improving Lactic acidosis resolved 1 week of URI/LRI symptoms which spread around her family followed by 2 days of change in sputum (green) and worsening breathing / chest tightness.  On initial evaluation febrile to 38.3, tachycardic in the 130s, tachypneic, on home 3 L O2.  WBC 19.  Lactate 2 ->  1.2 with IV fluids.  Flu/COVID/RSV negative.  Chest x-ray demonstrating diffuse reticulonodular and interstitial opacities with question of atypical infection.  Clinically appears to have had likely viral pneumonia and possibly developing bacterial pneumonia. - Continue ceftriaxone 2g IV every 24 hours in case of concomitant bacteremia, continue azithromycin 500 mg daily. - S/p 3 L IV fluid.  Stop maintenance fluid and continue oral hydration - Check RVP, sputum culture, MRSA nares. Follow-up blood cultures - DuoNeb every 6 hours scheduled, albuterol every 4 hours as needed, incentive spirometer, flutter valve.  Smoking cessation Current smoker at 1 pack a day.  Currently patient appears precontemplative about smoking cessation.  Declines nicotine replacement therapy while inpatient. - Would continue to discuss smoking cessation with her oxygen dependent COPD - Offer NRT Rx at discharge.  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  Continue home Symbicort equivalent, and additional nebs per above.  Smoking cessation per above. Recent history of choledocholithiasis s/p ERCP and cholecystectomy: Noted HLD: Continue home rosuvastatin ?  RLS: Continue home ropinirole Migraines: Currently not an active problem.  Takes triptan at home.  Body mass index is 26.17 kg/m.    DVT prophylaxis:  Lovenox Code Status:  Full Code Diet:  Diet Orders (From admission, onward)     Start     Ordered   02/21/24 0157  Diet Carb Modified Fluid consistency: Thin; Room service appropriate? Yes  Diet effective now       Question Answer Comment  Diet-HS Snack? Nothing   Calorie Level Medium 1600-2000   Fluid consistency: Thin   Room service appropriate? Yes       02/21/24 0201           Family Communication:  No   Consults:  none   Admission status:   Inpatient, Telemetry bed  Severity of Illness: The appropriate patient status for this patient is INPATIENT. Inpatient status is judged to be reasonable and necessary in order to provide the required intensity of service to ensure the patient's safety. The patient's presenting symptoms, physical exam findings, and initial radiographic and laboratory data in the context of their chronic comorbidities is felt to place them at high risk for further clinical deterioration. Furthermore, it is not anticipated that the patient will be medically stable for discharge from the hospital within 2 midnights of admission.   * I certify that at the point of admission it is my clinical judgment that the patient will require inpatient hospital care spanning beyond 2 midnights from the point of admission due to high intensity of service, high risk for further deterioration and high frequency of surveillance required.*   Dolly Rias, MD [facility]  How to contact the [facility] Attending or Consulting provider 7A - 7P or covering provider during after hours 7P -7A, for this patient.  Check the care team in [facility] and look for a) attending/consulting [facility] provider listed and b) the [facility] team listed Log into [email or web address] and use [place]'s universal password to access. If you do not have the password, please contact the hospital operator. Locate the [facility] provider you are looking for under [facility] and page to a number that you can be directly reached. If you still have difficulty reaching the provider, please page the [facility] (Director on Call) for the Hospitalists listed on amion for assistance.  02/21/2024, 3:28 AM

## 2024-02-22 DIAGNOSIS — J189 Pneumonia, unspecified organism: Secondary | ICD-10-CM | POA: Diagnosis not present

## 2024-02-22 LAB — BASIC METABOLIC PANEL WITH GFR
Anion gap: 13 (ref 5–15)
BUN: 7 mg/dL (ref 6–20)
CO2: 29 mmol/L (ref 22–32)
Calcium: 9.4 mg/dL (ref 8.9–10.3)
Chloride: 90 mmol/L — ABNORMAL LOW (ref 98–111)
Creatinine, Ser: 0.62 mg/dL (ref 0.44–1.00)
GFR, Estimated: >60 mL/min (ref >60)
Glucose, Bld: 416 mg/dL — ABNORMAL HIGH (ref 70–99)
Potassium: 4.9 mmol/L (ref 3.5–5.1)
Sodium: 132 mmol/L — ABNORMAL LOW (ref 135–145)

## 2024-02-22 LAB — MAGNESIUM: Magnesium: 1.8 mg/dL (ref 1.7–2.4)

## 2024-02-22 LAB — CBC
HCT: 35.8 % — ABNORMAL LOW (ref 36.0–46.0)
Hemoglobin: 11.4 g/dL — ABNORMAL LOW (ref 12.0–15.0)
MCH: 26.6 pg (ref 26.0–34.0)
MCHC: 31.8 g/dL (ref 30.0–36.0)
MCV: 83.4 fL (ref 80.0–100.0)
Platelets: 611 10*3/uL — ABNORMAL HIGH (ref 150–400)
RBC: 4.29 MIL/uL (ref 3.87–5.11)
RDW: 12.5 % (ref 11.5–15.5)
WBC: 14.6 10*3/uL — ABNORMAL HIGH (ref 4.0–10.5)
nRBC: 0 % (ref 0.0–0.2)

## 2024-02-22 LAB — GLUCOSE, CAPILLARY
Glucose-Capillary: 366 mg/dL — ABNORMAL HIGH (ref 70–99)
Glucose-Capillary: 349 mg/dL — ABNORMAL HIGH (ref 70–99)
Glucose-Capillary: 377 mg/dL — ABNORMAL HIGH (ref 70–99)
Glucose-Capillary: 188 mg/dL — ABNORMAL HIGH (ref 70–99)

## 2024-02-22 MED ORDER — ZOLPIDEM TARTRATE 5 MG PO TABS
5.0000 mg | ORAL_TABLET | Freq: Every evening | ORAL | Status: DC | PRN
  Administered 2024-02-22: 5 mg via ORAL
  Filled 2024-02-22: qty 1

## 2024-02-22 MED ORDER — DOCUSATE SODIUM 100 MG PO CAPS
100.0000 mg | ORAL_CAPSULE | Freq: Two times a day (BID) | ORAL | Status: DC
  Administered 2024-02-22 – 2024-02-23 (×3): 100 mg via ORAL
  Filled 2024-02-22 (×3): qty 1

## 2024-02-22 MED ORDER — INSULIN ASPART 100 UNIT/ML IJ SOLN
0.0000 [IU] | Freq: Every day | INTRAMUSCULAR | Status: DC
Start: 2024-02-22 — End: 2024-02-23

## 2024-02-22 MED ORDER — INSULIN ASPART 100 UNIT/ML IJ SOLN
0.0000 [IU] | Freq: Three times a day (TID) | INTRAMUSCULAR | Status: DC
  Administered 2024-02-22: 15 [IU] via SUBCUTANEOUS
  Administered 2024-02-22 – 2024-02-23 (×2): 20 [IU] via SUBCUTANEOUS

## 2024-02-22 MED ORDER — INSULIN ASPART 100 UNIT/ML IJ SOLN
2.0000 [IU] | Freq: Three times a day (TID) | INTRAMUSCULAR | Status: DC
  Administered 2024-02-22 – 2024-02-23 (×3): 2 [IU] via SUBCUTANEOUS

## 2024-02-22 MED ORDER — IPRATROPIUM-ALBUTEROL 0.5-2.5 (3) MG/3ML IN SOLN
3.0000 mL | Freq: Three times a day (TID) | RESPIRATORY_TRACT | Status: DC
  Administered 2024-02-22: 3 mL via RESPIRATORY_TRACT
  Filled 2024-02-22 (×2): qty 3

## 2024-02-22 MED ORDER — POLYETHYLENE GLYCOL 3350 17 G PO PACK
17.0000 g | PACK | Freq: Every day | ORAL | Status: DC
  Administered 2024-02-22 – 2024-02-23 (×2): 17 g via ORAL
  Filled 2024-02-22 (×2): qty 1

## 2024-02-22 NOTE — Progress Notes (Signed)
 PROGRESS NOTE  [name] OJJ:[id number] DOB: 01-01-1966 DOA: 02/20/2024 PCP: No primary care provider on file.   LOS: 1 day   Brief narrative:  [name] is a 58 y.o. female with past medical history of COPD, chronic respiratory failure on 3 L of oxygen at baseline, current smoker at 1 pack a day, diabetes mellitus type 2, history of recent cholecystectomy last month presented hospital with cough and shortness of breath.  Her entire family was sick at home from flulike symptoms.  Patient also admitted to having cough with productive clear sputum shortness of breath and some runny nose.  In the ED patient was tachycardic with diffuse expiratory wheezes.  Labs showed lactate of 2.0, potassium of 3.4.  UA was abnormal.  This x-ray showed diffuse reticular nodular interstitial opacities.  EKG showed sinus tachycardia with RBBB.  In the ED patient received Rocephin and Zithromax 3 L of IV fluid and was admitted hospital for further evaluation and treatment.   Assessment/Plan: Active Problems:   Sepsis (HCC)   CAP (community acquired pneumonia)   Encounter for smoking cessation counseling  Sepsis secondary to multifocal postviral bacterial pneumonia. COPD with chronic respiratory failure on 3 L of oxygen at home.  Had 1 week history of URI prior to the symptoms.  Patient was febrile in the ED with tachycardia and tachypnea with elevated lactate and infiltrate in the chest consistent with pneumonia..  Continue Rocephin IV including Zithromax. Respiratory viral panel, COVID influenza RSV, MRSA negative so far.  Blood cultures negative in 2 days.  Continue DuoNebs incentive spirometry flutter valve.  Currently on 3 L of oxygen by nasal cannula.  Prednisone was initiated from yesterday.  Will continue prednisone for now.  Diabetes mellitus with hyperglycemia.  Patient is on Lantus Ozempic metformin glipizide at home.  Continue long-acting and sliding scale insulin while in the hospital.  Dose of  insulin has been changed to resistant scale with mealtime insulin due to hyperglycemia from prednisone.  Hypokalemia.  Replaced.  Check levels in AM.  Hyperlipidemia continue statins.  Restless leg syndrome.  Continue ropinirole  History of migraines on triptan at home. DVT prophylaxis: enoxaparin (LOVENOX) injection 40 mg Start: 02/21/24 1000   Disposition: Home likely 02-23-24  Status is: Inpatient Remains inpatient appropriate because: Pending clinical improvement, IV antibiotic,    Code Status:     Code Status: Full Code  Family Communication: None at bedside  Consultants: None  Procedures: None  Anti-infectives:  Rocephin and Zithromax  Anti-infectives (From admission, onward)    Start     Dose/Rate Route Frequency Ordered Stop   02/21/24 2300  cefTRIAXone (ROCEPHIN) 2 g in sodium chloride 0.9 % 100 mL IVPB        2 g 200 mL/hr over 30 Minutes Intravenous Every 24 hours 02/21/24 0201 02/25/24 2259   02/21/24 2200  azithromycin (ZITHROMAX) tablet 500 mg        500 mg Oral Daily at bedtime 02/21/24 0201 02/23/24 2159   02/20/24 2245  cefTRIAXone (ROCEPHIN) 2 g in sodium chloride 0.9 % 100 mL IVPB        2 g 200 mL/hr over 30 Minutes Intravenous Once 02/20/24 2238 02/20/24 2345   02/20/24 2245  azithromycin (ZITHROMAX) 500 mg in sodium chloride 0.9 % 250 mL IVPB        500 mg 250 mL/hr over 60 Minutes Intravenous  Once 02/20/24 2238 02/21/24 0030   02/20/24 2245  cefTRIAXone (ROCEPHIN) 1 g in sodium chloride  0.9 % 100 mL IVPB        1 g 200 mL/hr over 30 Minutes Intravenous  Once 02/20/24 2238 02/20/24 2345   02/20/24 2245  azithromycin (ZITHROMAX) 500 mg in sodium chloride 0.9 % 250 mL IVPB        500 mg 250 mL/hr over 60 Minutes Intravenous  Once 02/20/24 2238 02/21/24 0030        Subjective: Today, patient was seen and examined at bedside.  Still with fatigue shortness of breath and some cough with productive phlegm production.  Denies any fever, chills  or rigor.  Feels slightly better than yesterday but not quite up to her baseline.  Complains of mild constipation.  Objective: Vitals:   02/22/24 0832 02/22/24 1011  BP:    Pulse:  (!) 105  Resp:  20  Temp:    SpO2: 98% 94%    Intake/Output Summary (Last 24 hours) at 02/22/2024 1139 Last data filed at 02/22/2024 1011 Gross per 24 hour  Intake 840 ml  Output --  Net 840 ml   Filed Weights   02/20/24 2158 02/21/24 0611  Weight: 60.8 kg 61 kg   Body mass index is 26.26 kg/m.   Physical Exam:  GENERAL: Patient is alert awake and oriented. Not in obvious distress.  On nasal cannula oxygen at 3 L/min HENT: No scleral pallor or icterus. Pupils equally reactive to light. Oral mucosa is moist NECK: is supple, no gross swelling noted. CHEST:   Diminished breath sounds bilaterally.  Coarse breath sounds noted with mild wheezing. CVS: S1 and S2 heard, no murmur. Regular rate and rhythm.  ABDOMEN: Soft, non-tender, bowel sounds are present. EXTREMITIES: No edema. CNS: Cranial nerves are intact. No focal motor deficits. SKIN: warm and dry without rashes.  Data Review: I have personally reviewed the following laboratory data and studies,  CBC: Recent Labs  Lab 02/20/24 2202 02/21/24 1007 02/22/24 0456  WBC 19.6* 16.6* 14.6*  HGB 12.6 10.6* 11.4*  HCT 39.2 33.9* 35.8*  MCV 82.0 83.5 83.4  PLT 723* 575* 611*   Basic Metabolic Panel: Recent Labs  Lab 02/20/24 2202 02/21/24 0002 02/21/24 1007 02/22/24 0456  NA 131* 133* 136 132*  K 4.0 3.4* 3.7 4.9  CL 86* 91* 96* 90*  CO2 34* 32 33* 29  GLUCOSE 276* 220* 280* 416*  BUN 5* <5* 5* 7  CREATININE 0.51 0.46 0.47 0.62  CALCIUM 8.9 8.1* 8.6* 9.4  MG  --   --  1.5* 1.8  PHOS  --   --  2.7  --    Liver Function Tests: Recent Labs  Lab 02/21/24 0002  AST 13*  ALT 8  ALKPHOS 95  BILITOT 0.2  PROT 6.9  ALBUMIN 2.2*   No results for input(s): "LIPASE", "AMYLASE" in the last 168 hours. No results for input(s): "AMMONIA"  in the last 168 hours. Cardiac Enzymes: No results for input(s): "CKTOTAL", "CKMB", "CKMBINDEX", "TROPONINI" in the last 168 hours. BNP (last 3 results) Recent Labs    04/20/23 0940 01/03/24 1718  BNP 110.0* 33.0    ProBNP (last 3 results) No results for input(s): "PROBNP" in the last 8760 hours.  CBG: Recent Labs  Lab 02/21/24 0615 02/21/24 1146 02/21/24 1629 02/21/24 2121 02/22/24 0605  GLUCAP 253* 286* 253* 259* 366*   Recent Results (from the past 240 hours)  Blood culture (routine x 2)     Status: None (Preliminary result)   Collection Time: 02/20/24 10:44 PM   Specimen: BLOOD  Result Value Ref Range Status   Specimen Description BLOOD RIGHT ANTECUBITAL  Final   Special Requests   Final    BOTTLES DRAWN AEROBIC AND ANAEROBIC Blood Culture adequate volume   Culture   Final    NO GROWTH 2 DAYS Performed at [facility], [address] [place], [place] [address]    Report Status PENDING  Incomplete  Resp panel by RT-PCR (RSV, Flu A&B, Covid)     Status: None   Collection Time: 02/20/24 10:44 PM   Specimen: Nasal Swab  Result Value Ref Range Status   SARS Coronavirus 2 by RT PCR NEGATIVE NEGATIVE Final   Influenza A by PCR NEGATIVE NEGATIVE Final   Influenza B by PCR NEGATIVE NEGATIVE Final    Comment: (NOTE) The Xpert Xpress SARS-CoV-2/FLU/RSV plus assay is intended as an aid in the diagnosis of influenza from Nasopharyngeal swab specimens and should not be used as a sole basis for treatment. Nasal washings and aspirates are unacceptable for Xpert Xpress SARS-CoV-2/FLU/RSV testing.  Fact Sheet for Patients: BloggerCourse.com  Fact Sheet for Healthcare Providers: SeriousBroker.it  This test is not yet approved or cleared by the [place] FDA and has been authorized for detection and/or diagnosis of SARS-CoV-2 by FDA under an Emergency Use Authorization (EUA). This EUA will remain in effect  (meaning this test can be used) for the duration of the COVID-19 declaration under Section 564(b)(1) of the Act, 21 U.S.C. section 360bbb-3(b)(1), unless the authorization is terminated or revoked.     Resp Syncytial Virus by PCR NEGATIVE NEGATIVE Final    Comment: (NOTE) Fact Sheet for Patients: BloggerCourse.com  Fact Sheet for Healthcare Providers: SeriousBroker.it  This test is not yet approved or cleared by the [place] FDA and has been authorized for detection and/or diagnosis of SARS-CoV-2 by FDA under an Emergency Use Authorization (EUA). This EUA will remain in effect (meaning this test can be used) for the duration of the COVID-19 declaration under Section 564(b)(1) of the Act, 21 U.S.C. section 360bbb-3(b)(1), unless the authorization is terminated or revoked.  Performed at [facility], [address], [place], [place] [address]   Blood culture (routine x 2)     Status: None (Preliminary result)   Collection Time: 02/20/24 10:50 PM   Specimen: BLOOD LEFT FOREARM  Result Value Ref Range Status   Specimen Description BLOOD LEFT FOREARM  Final   Special Requests   Final    BOTTLES DRAWN AEROBIC AND ANAEROBIC Blood Culture adequate volume   Culture   Final    NO GROWTH 2 DAYS Performed at [facility], [address], [place], [place] [address]    Report Status PENDING  Incomplete  Expectorated Sputum Assessment w Gram Stain, Rflx to Resp Cult     Status: None   Collection Time: 02/21/24  2:01 AM   Specimen: Sputum  Result Value Ref Range Status   Specimen Description SPUTUM  Final   Special Requests NONE  Final   Sputum evaluation   Final    THIS SPECIMEN IS ACCEPTABLE FOR SPUTUM CULTURE Performed at [facility], [address], [place], [place] [address]    Report Status 02/21/2024 FINAL  Final  Culture, Respiratory w Gram Stain     Status: None (Preliminary result)   Collection Time:  02/21/24  2:01 AM   Specimen: SPU  Result Value Ref Range Status   Specimen Description SPUTUM  Final   Special Requests NONE Reflexed from  [id number]  Final   Gram Stain   Final    ABUNDANT WBC PRESENT, PREDOMINANTLY PMN FEW GRAM POSITIVE RODS Performed at [facility], [address] [place], [place] [address]    Culture PENDING  Incomplete   Report Status PENDING  Incomplete  Respiratory (~20 pathogens) panel by PCR     Status: None   Collection Time: 02/21/24  5:46 AM   Specimen: Nasopharyngeal Swab; Respiratory  Result Value Ref Range Status   Adenovirus NOT DETECTED NOT DETECTED Final   Coronavirus 229E NOT DETECTED NOT DETECTED Final    Comment: (NOTE) The Coronavirus on the Respiratory Panel, DOES NOT test for the novel  Coronavirus (2019 nCoV)    Coronavirus HKU1 NOT DETECTED NOT DETECTED Final   Coronavirus NL63 NOT DETECTED NOT DETECTED Final   Coronavirus OC43 NOT DETECTED NOT DETECTED Final   Metapneumovirus NOT DETECTED NOT DETECTED Final   Rhinovirus / Enterovirus NOT DETECTED NOT DETECTED Final   Influenza A NOT DETECTED NOT DETECTED Final   Influenza B NOT DETECTED NOT DETECTED Final   Parainfluenza Virus 1 NOT DETECTED NOT DETECTED Final   Parainfluenza Virus 2 NOT DETECTED NOT DETECTED Final   Parainfluenza Virus 3 NOT DETECTED NOT DETECTED Final   Parainfluenza Virus 4 NOT DETECTED NOT DETECTED Final   Respiratory Syncytial Virus NOT DETECTED NOT DETECTED Final   Bordetella pertussis NOT DETECTED NOT DETECTED Final   Bordetella Parapertussis NOT DETECTED NOT DETECTED Final   Chlamydophila pneumoniae NOT DETECTED NOT DETECTED Final   Mycoplasma pneumoniae NOT DETECTED NOT DETECTED Final    Comment: Performed at [facility], [address], [place], [place] [address]  MRSA Next Gen by PCR, Nasal     Status: None   Collection Time: 02/21/24  5:46 AM   Specimen: Nasopharyngeal Swab; Nasal Swab  Result Value Ref Range Status   MRSA by PCR Next Gen  NOT DETECTED NOT DETECTED Final    Comment: (NOTE) The GeneXpert MRSA Assay (FDA approved for NASAL specimens only), is one component of a comprehensive MRSA colonization surveillance program. It is not intended to diagnose MRSA infection nor to guide or monitor treatment for MRSA infections. Test performance is not FDA approved in patients less than 71 years old. Performed at [facility], [address] [place], [place] [address]      Studies: DG Chest 2 View Result Date: 02/20/2024 CLINICAL DATA:  Shortness of breath, chest pain EXAM: CHEST - 2 VIEW COMPARISON:  01/04/2024 FINDINGS: Heart and mediastinal contours are within normal limits. Reticulonodular interstitial prominence throughout the lungs. No effusions. No acute bony abnormality. IMPRESSION: Diffuse reticulonodular interstitial opacities throughout the lungs. This may reflect atypical infection. Electronically Signed   By: Charlett Nose M.D.   On: 02/20/2024 22:26      Joycelyn Das, MD  [facility] 02/22/2024  If 7PM-7AM, please contact night-coverage

## 2024-02-23 DIAGNOSIS — J189 Pneumonia, unspecified organism: Secondary | ICD-10-CM | POA: Diagnosis not present

## 2024-02-23 LAB — CBC
HCT: 33.1 % — ABNORMAL LOW (ref 36.0–46.0)
Hemoglobin: 10.3 g/dL — ABNORMAL LOW (ref 12.0–15.0)
MCH: 26.1 pg (ref 26.0–34.0)
MCHC: 31.1 g/dL (ref 30.0–36.0)
MCV: 83.8 fL (ref 80.0–100.0)
Platelets: 562 10*3/uL — ABNORMAL HIGH (ref 150–400)
RBC: 3.95 MIL/uL (ref 3.87–5.11)
RDW: 12.5 % (ref 11.5–15.5)
WBC: 13.7 10*3/uL — ABNORMAL HIGH (ref 4.0–10.5)
nRBC: 0 % (ref 0.0–0.2)

## 2024-02-23 LAB — BASIC METABOLIC PANEL WITH GFR
Anion gap: 9 (ref 5–15)
BUN: 12 mg/dL (ref 6–20)
CO2: 36 mmol/L — ABNORMAL HIGH (ref 22–32)
Calcium: 9.2 mg/dL (ref 8.9–10.3)
Chloride: 90 mmol/L — ABNORMAL LOW (ref 98–111)
Creatinine, Ser: 0.6 mg/dL (ref 0.44–1.00)
GFR, Estimated: >60 mL/min (ref >60)
Glucose, Bld: 339 mg/dL — ABNORMAL HIGH (ref 70–99)
Potassium: 4.2 mmol/L (ref 3.5–5.1)
Sodium: 135 mmol/L (ref 135–145)

## 2024-02-23 LAB — GLUCOSE, CAPILLARY
Glucose-Capillary: 370 mg/dL — ABNORMAL HIGH (ref 70–99)
Glucose-Capillary: 269 mg/dL — ABNORMAL HIGH (ref 70–99)

## 2024-02-23 LAB — CULTURE, RESPIRATORY W GRAM STAIN

## 2024-02-23 MED ORDER — NICOTINE 14 MG/24HR TD PT24: 14.0000 mg | MEDICATED_PATCH | Freq: Every day | TRANSDERMAL | 0 refills | Status: DC | PRN

## 2024-02-23 MED ORDER — CEFUROXIME AXETIL 500 MG PO TABS: 500.0000 mg | ORAL_TABLET | Freq: Two times a day (BID) | ORAL | 0 refills | Status: AC

## 2024-02-23 MED ORDER — GUAIFENESIN-DM 100-10 MG/5ML PO SYRP
5.0000 mL | ORAL_SOLUTION | ORAL | 0 refills | Status: AC | PRN
Start: 2024-02-23 — End: ?

## 2024-02-23 MED ORDER — OXYCODONE-ACETAMINOPHEN 5-325 MG PO TABS: 1.0000 | ORAL_TABLET | Freq: Four times a day (QID) | ORAL | 0 refills | Status: DC | PRN

## 2024-02-23 MED ORDER — AZITHROMYCIN 500 MG PO TABS: 500.0000 mg | ORAL_TABLET | Freq: Every day | ORAL | 0 refills | Status: AC

## 2024-02-23 NOTE — TOC Transition Note (Signed)
 Transition of Care (TOC) - Discharge Note Donn Pierini RN, BSN Transitions of Care Unit 4E- RN Case Manager See Treatment Team for direct phone #   Patient Details  Name: [name] MRN: [id number] Date of Birth: 1966-08-11  [facility]) CM/SW Contact:  Darrold Span, RN Phone Number: 02/23/2024, 11:27 AM   Clinical Narrative:    Pt stable for transition home today, has baseline 02 at home. Family to provide transportation home and will bring portable 02 for transport.   No HH or DME needs noted.    Final next level of care: Home/Self Care Barriers to Discharge: No Barriers Identified   Patient Goals and CMS Choice Patient states their goals for this hospitalization and ongoing recovery are:: return home   Choice offered to / list presented to : NA      Discharge Placement               Home        Discharge Plan and Services Additional resources added to the After Visit Summary for       Post Acute Care Choice: NA                               Social Drivers of Health (SDOH) Interventions SDOH Screenings   Food Insecurity: No Food Insecurity (01/04/2024)  Housing: Unknown (01/04/2024)  Transportation Needs: No Transportation Needs (01/04/2024)  Utilities: Not At Risk (01/04/2024)  Tobacco Use: High Risk (02/20/2024)     Readmission Risk Interventions    02/23/2024   11:27 AM 03/11/2022    9:30 AM  Readmission Risk Prevention Plan  Transportation Screening Complete Complete  HRI or Home Care Consult Complete Complete  Social Work Consult for Recovery Care Planning/Counseling Complete Complete  Palliative Care Screening Not Applicable Not Applicable  Medication Review Oceanographer) Complete Complete

## 2024-02-23 NOTE — Plan of Care (Signed)
  Problem: Education: Goal: Knowledge of General Education information will improve Description: Including pain rating scale, medication(s)/side effects and non-pharmacologic comfort measures Outcome: Completed/Met   Problem: Health Behavior/Discharge Planning: Goal: Ability to manage health-related needs will improve Outcome: Completed/Met   Problem: Clinical Measurements: Goal: Ability to maintain clinical measurements within normal limits will improve Outcome: Completed/Met Goal: Will remain free from infection Outcome: Completed/Met Goal: Diagnostic test results will improve Outcome: Completed/Met Goal: Respiratory complications will improve Outcome: Completed/Met Goal: Cardiovascular complication will be avoided Outcome: Completed/Met   Problem: Activity: Goal: Risk for activity intolerance will decrease Outcome: Completed/Met   Problem: Nutrition: Goal: Adequate nutrition will be maintained Outcome: Completed/Met   Problem: Coping: Goal: Level of anxiety will decrease Outcome: Completed/Met   Problem: Elimination: Goal: Will not experience complications related to bowel motility Outcome: Completed/Met Goal: Will not experience complications related to urinary retention Outcome: Completed/Met   Problem: Pain Managment: Goal: General experience of comfort will improve and/or be controlled Outcome: Completed/Met   Problem: Safety: Goal: Ability to remain free from injury will improve Outcome: Completed/Met   Problem: Skin Integrity: Goal: Risk for impaired skin integrity will decrease Outcome: Completed/Met   Problem: Education: Goal: Knowledge of General Education information will improve Description: Including pain rating scale, medication(s)/side effects and non-pharmacologic comfort measures Outcome: Completed/Met   Problem: Health Behavior/Discharge Planning: Goal: Ability to manage health-related needs will improve Outcome: Completed/Met   Problem:  Clinical Measurements: Goal: Ability to maintain clinical measurements within normal limits will improve Outcome: Completed/Met Goal: Will remain free from infection Outcome: Completed/Met Goal: Diagnostic test results will improve Outcome: Completed/Met Goal: Respiratory complications will improve Outcome: Completed/Met Goal: Cardiovascular complication will be avoided Outcome: Completed/Met   Problem: Activity: Goal: Risk for activity intolerance will decrease Outcome: Completed/Met   Problem: Nutrition: Goal: Adequate nutrition will be maintained Outcome: Completed/Met   Problem: Coping: Goal: Level of anxiety will decrease Outcome: Completed/Met   Problem: Elimination: Goal: Will not experience complications related to bowel motility Outcome: Completed/Met Goal: Will not experience complications related to urinary retention Outcome: Completed/Met   Problem: Pain Managment: Goal: General experience of comfort will improve and/or be controlled Outcome: Completed/Met   Problem: Safety: Goal: Ability to remain free from injury will improve Outcome: Completed/Met   Problem: Skin Integrity: Goal: Risk for impaired skin integrity will decrease Outcome: Completed/Met

## 2024-02-23 NOTE — Plan of Care (Signed)

## 2024-02-23 NOTE — Progress Notes (Signed)
 Discharge instructions reviewed with pt.  Copy of instructions given to pt. Pt informed her scripts were sent to her pharmacy for pick up. Pt's son is on his way with pt's O2 tank.  Pt will be d/c'd via wheelchair with belongings, with her son and will be            escorted by staff.   Annice Needy, RN SWOT

## 2024-02-23 NOTE — Discharge Summary (Signed)
 Physician Discharge Summary  [name] XBJ:[id number] DOB: 01-10-1966 DOA: 02/20/2024  PCP: No primary care provider on file.  Admit date: 02/20/2024 Discharge date: 02/23/2024  Admitted From: Home  Discharge disposition: Home   Recommendations for Outpatient Follow-Up:   Follow up with your primary care provider in one week.  Check CBC, BMP, magnesium in the next visit Patient might benefit from follow-up with [facility] as outpatient.  Has not had a follow-up recently.   Discharge Diagnosis:   Active Problems:   Sepsis (HCC)   CAP (community acquired pneumonia)   Encounter for smoking cessation counseling   Discharge Condition: Improved.  Diet recommendation:   Carbohydrate-modified.    Wound care: None.  Code status: Full.   History of Present Illness:   [name] is a 58 y.o. female with past medical history of COPD, chronic respiratory failure on 3 L of oxygen at baseline, current smoker at 1 pack a day, diabetes mellitus type 2, history of recent cholecystectomy last month presented hospital with cough and shortness of breath.  Her entire family was sick at home from flulike symptoms.  Patient also admitted to having cough with productive clear sputum shortness of breath and some runny nose.  In the ED patient was tachycardic with diffuse expiratory wheezes.  Labs showed lactate of 2.0, potassium of 3.4.  UA was abnormal.  This x-ray showed diffuse reticular nodular interstitial opacities.  EKG showed sinus tachycardia with RBBB.  In the ED patient received Rocephin and Zithromax 3 L of IV fluid and was admitted hospital for further evaluation and treatment.   Hospital Course:   Following conditions were addressed during hospitalization as listed below,  Sepsis secondary to multifocal postviral bacterial pneumonia. COPD with chronic respiratory failure on 3 L of oxygen at home.   Had 1 week history of URI prior to the symptoms.  Patient was febrile in  the ED with tachycardia and tachypnea with elevated lactate and infiltrate in the chest consistent with pneumonia..  Patient received Rocephin IV including Zithromax. Respiratory viral panel, COVID influenza RSV, MRSA negative so far.  Blood cultures negative in 3 days.  Received DuoNebs incentive spirometry flutter valve.  Currently on 3 L of oxygen by nasal cannula.  Continue prednisone taper on discharge.  Has not had follow-up with [facility] recently.  Encouraged to follow-up with PCP and pulmonary as outpatient.  Diabetes mellitus with hyperglycemia.   Patient is on Lantus Ozempic metformin glipizide at home.  Received long-acting and sliding scale insulin while in the hospital.  Will discontinue prednisone on discharge.    Hypokalemia.  Replaced.  Potassium of 4.2 prior to discharge.   Hyperlipidemia continue statins.   Restless leg syndrome.  Continue ropinirole   History of migraines on triptan at home.  Disposition.  At this time, patient is stable for disposition home with outpatient PCP follow-up.  Medical Consultants:   None.  Procedures:    None Subjective:   Today, patient was seen and examined at bedside.  Complains of mild cough but clearing up phlegm color.  No chest pain or dyspnea.  Discharge Exam:   Vitals:   02/23/24 0525 02/23/24 0818  BP:  (!) 160/85  Pulse:    Resp:  (!) 22  Temp:    SpO2: 95% 95%   Vitals:   02/22/24 2313 02/23/24 0325 02/23/24 0525 02/23/24 0818  BP: (!) 162/85 (!) 146/91  (!) 160/85  Pulse: (!) 104 98    Resp: 20  18  (!) 22  Temp: 97.9 F (36.6 C) 98 F (36.7 C)    TempSrc: Oral Oral    SpO2: 93% 94% 95% 95%  Weight:      Height:       Body mass index is 26.26 kg/m.  General: Alert awake, not in obvious distress, on 3 L of oxygen by nasal cannula HENT: pupils equally reacting to light,  No scleral pallor or icterus noted. Oral mucosa is moist.  Chest:    Diminished breath sounds bilaterally.  Coarse breath sounds  noted. CVS: S1 &S2 heard. No murmur.  Regular rate and rhythm. Abdomen: Soft, nontender, nondistended.  Bowel sounds are heard.   Extremities: No cyanosis, clubbing or edema.  Peripheral pulses are palpable. Psych: Alert, awake and oriented, normal mood CNS:  No cranial nerve deficits.  Power equal in all extremities.   Skin: Warm and dry.  No rashes noted.  The results of significant diagnostics from this hospitalization (including imaging, microbiology, ancillary and laboratory) are listed below for reference.     Diagnostic Studies:   DG Chest 2 View Result Date: 02/20/2024 CLINICAL DATA:  Shortness of breath, chest pain EXAM: CHEST - 2 VIEW COMPARISON:  01/04/2024 FINDINGS: Heart and mediastinal contours are within normal limits. Reticulonodular interstitial prominence throughout the lungs. No effusions. No acute bony abnormality. IMPRESSION: Diffuse reticulonodular interstitial opacities throughout the lungs. This may reflect atypical infection. Electronically Signed   By: Charlett Nose M.D.   On: 02/20/2024 22:26     Labs:   Basic Metabolic Panel: Recent Labs  Lab 02/20/24 2202 02/21/24 0002 02/21/24 1007 02/22/24 0456 02/23/24 0325  NA 131* 133* 136 132* 135  K 4.0 3.4* 3.7 4.9 4.2  CL 86* 91* 96* 90* 90*  CO2 34* 32 33* 29 36*  GLUCOSE 276* 220* 280* 416* 339*  BUN 5* <5* 5* 7 12  CREATININE 0.51 0.46 0.47 0.62 0.60  CALCIUM 8.9 8.1* 8.6* 9.4 9.2  MG  --   --  1.5* 1.8  --   PHOS  --   --  2.7  --   --    GFR Estimated Creatinine Clearance: 64.8 mL/min (by C-G formula based on SCr of 0.6 mg/dL). Liver Function Tests: Recent Labs  Lab 02/21/24 0002  AST 13*  ALT 8  ALKPHOS 95  BILITOT 0.2  PROT 6.9  ALBUMIN 2.2*   No results for input(s): "LIPASE", "AMYLASE" in the last 168 hours. No results for input(s): "AMMONIA" in the last 168 hours. Coagulation profile Recent Labs  Lab 02/21/24 0002  INR 1.1    CBC: Recent Labs  Lab 02/20/24 2202  02/21/24 1007 02/22/24 0456 02/23/24 0325  WBC 19.6* 16.6* 14.6* 13.7*  HGB 12.6 10.6* 11.4* 10.3*  HCT 39.2 33.9* 35.8* 33.1*  MCV 82.0 83.5 83.4 83.8  PLT 723* 575* 611* 562*   Cardiac Enzymes: No results for input(s): "CKTOTAL", "CKMB", "CKMBINDEX", "TROPONINI" in the last 168 hours. BNP: Invalid input(s): "POCBNP" CBG: Recent Labs  Lab 02/22/24 1147 02/22/24 1553 02/22/24 2117 02/23/24 0542 02/23/24 0808  GLUCAP 349* 377* 188* 370* 269*   D-Dimer No results for input(s): "DDIMER" in the last 72 hours. Hgb A1c No results for input(s): "HGBA1C" in the last 72 hours. Lipid Profile No results for input(s): "CHOL", "HDL", "LDLCALC", "TRIG", "CHOLHDL", "LDLDIRECT" in the last 72 hours. Thyroid function studies No results for input(s): "TSH", "T4TOTAL", "T3FREE", "THYROIDAB" in the last 72 hours.  Invalid input(s): "FREET3" Anemia work up No results  for input(s): "VITAMINB12", "FOLATE", "FERRITIN", "TIBC", "IRON", "RETICCTPCT" in the last 72 hours. Microbiology Recent Results (from the past 240 hours)  Blood culture (routine x 2)     Status: None (Preliminary result)   Collection Time: 02/20/24 10:44 PM   Specimen: BLOOD  Result Value Ref Range Status   Specimen Description BLOOD RIGHT ANTECUBITAL  Final   Special Requests   Final    BOTTLES DRAWN AEROBIC AND ANAEROBIC Blood Culture adequate volume   Culture   Final    NO GROWTH 3 DAYS Performed at [facility], [address], [place], [place] [address]    Report Status PENDING  Incomplete  Resp panel by RT-PCR (RSV, Flu A&B, Covid)     Status: None   Collection Time: 02/20/24 10:44 PM   Specimen: Nasal Swab  Result Value Ref Range Status   SARS Coronavirus 2 by RT PCR NEGATIVE NEGATIVE Final   Influenza A by PCR NEGATIVE NEGATIVE Final   Influenza B by PCR NEGATIVE NEGATIVE Final    Comment: (NOTE) The Xpert Xpress SARS-CoV-2/FLU/RSV plus assay is intended as an aid in the diagnosis of influenza from  Nasopharyngeal swab specimens and should not be used as a sole basis for treatment. Nasal washings and aspirates are unacceptable for Xpert Xpress SARS-CoV-2/FLU/RSV testing.  Fact Sheet for Patients: BloggerCourse.com  Fact Sheet for Healthcare Providers: SeriousBroker.it  This test is not yet approved or cleared by the [place] FDA and has been authorized for detection and/or diagnosis of SARS-CoV-2 by FDA under an Emergency Use Authorization (EUA). This EUA will remain in effect (meaning this test can be used) for the duration of the COVID-19 declaration under Section 564(b)(1) of the Act, 21 U.S.C. section 360bbb-3(b)(1), unless the authorization is terminated or revoked.     Resp Syncytial Virus by PCR NEGATIVE NEGATIVE Final    Comment: (NOTE) Fact Sheet for Patients: BloggerCourse.com  Fact Sheet for Healthcare Providers: SeriousBroker.it  This test is not yet approved or cleared by the [place] FDA and has been authorized for detection and/or diagnosis of SARS-CoV-2 by FDA under an Emergency Use Authorization (EUA). This EUA will remain in effect (meaning this test can be used) for the duration of the COVID-19 declaration under Section 564(b)(1) of the Act, 21 U.S.C. section 360bbb-3(b)(1), unless the authorization is terminated or revoked.  Performed at [facility], [address], [place], [place] [address]   Blood culture (routine x 2)     Status: None (Preliminary result)   Collection Time: 02/20/24 10:50 PM   Specimen: BLOOD LEFT FOREARM  Result Value Ref Range Status   Specimen Description BLOOD LEFT FOREARM  Final   Special Requests   Final    BOTTLES DRAWN AEROBIC AND ANAEROBIC Blood Culture adequate volume   Culture   Final    NO GROWTH 3 DAYS Performed at [facility], [address] [place], [place] [address]    Report Status  PENDING  Incomplete  Expectorated Sputum Assessment w Gram Stain, Rflx to Resp Cult     Status: None   Collection Time: 02/21/24  2:01 AM   Specimen: Sputum  Result Value Ref Range Status   Specimen Description SPUTUM  Final   Special Requests NONE  Final   Sputum evaluation   Final    THIS SPECIMEN IS ACCEPTABLE FOR SPUTUM CULTURE Performed at [facility], [address] [place], [place] [address]    Report Status 02/21/2024 FINAL  Final  Culture, Respiratory w Gram Stain     Status: None   Collection Time: 02/21/24  2:01 AM   Specimen: SPU  Result Value Ref Range Status   Specimen Description SPUTUM  Final   Special Requests NONE Reflexed from [id number]  Final   Gram Stain   Final    ABUNDANT WBC PRESENT, PREDOMINANTLY PMN FEW GRAM POSITIVE RODS    Culture   Final    FEW Normal respiratory flora-no Staph aureus or Pseudomonas seen Performed at [facility], [address] [place], [place] [address]    Report Status 02/23/2024 FINAL  Final  Respiratory (~20 pathogens) panel by PCR     Status: None   Collection Time: 02/21/24  5:46 AM   Specimen: Nasopharyngeal Swab; Respiratory  Result Value Ref Range Status   Adenovirus NOT DETECTED NOT DETECTED Final   Coronavirus 229E NOT DETECTED NOT DETECTED Final    Comment: (NOTE) The Coronavirus on the Respiratory Panel, DOES NOT test for the novel  Coronavirus (2019 nCoV)    Coronavirus HKU1 NOT DETECTED NOT DETECTED Final   Coronavirus NL63 NOT DETECTED NOT DETECTED Final   Coronavirus OC43 NOT DETECTED NOT DETECTED Final   Metapneumovirus NOT DETECTED NOT DETECTED Final   Rhinovirus / Enterovirus NOT DETECTED NOT DETECTED Final   Influenza A NOT DETECTED NOT DETECTED Final   Influenza B NOT DETECTED NOT DETECTED Final   Parainfluenza Virus 1 NOT DETECTED NOT DETECTED Final   Parainfluenza Virus 2 NOT DETECTED NOT DETECTED Final   Parainfluenza Virus 3 NOT DETECTED NOT DETECTED Final   Parainfluenza Virus 4 NOT  DETECTED NOT DETECTED Final   Respiratory Syncytial Virus NOT DETECTED NOT DETECTED Final   Bordetella pertussis NOT DETECTED NOT DETECTED Final   Bordetella Parapertussis NOT DETECTED NOT DETECTED Final   Chlamydophila pneumoniae NOT DETECTED NOT DETECTED Final   Mycoplasma pneumoniae NOT DETECTED NOT DETECTED Final    Comment: Performed at [facility], [address], [place], [place] [address]  MRSA Next Gen by PCR, Nasal     Status: None   Collection Time: 02/21/24  5:46 AM   Specimen: Nasopharyngeal Swab; Nasal Swab  Result Value Ref Range Status   MRSA by PCR Next Gen NOT DETECTED NOT DETECTED Final    Comment: (NOTE) The GeneXpert MRSA Assay (FDA approved for NASAL specimens only), is one component of a comprehensive MRSA colonization surveillance program. It is not intended to diagnose MRSA infection nor to guide or monitor treatment for MRSA infections. Test performance is not FDA approved in patients less than 26 years old. Performed at [facility], [address] [place], [place] [address]      Discharge Instructions:   Discharge Instructions     Ambulatory Referral for Lung Cancer Scre   Complete by: As directed    Call MD for:  difficulty breathing, headache or visual disturbances   Complete by: As directed    Call MD for:  temperature >100.4   Complete by: As directed    Diet Carb Modified   Complete by: As directed    Discharge instructions   Complete by: As directed    Follow up with your primary care provider in one week. Complete the course of antibiotics. Seek medical attention for worsening symptoms. Continue oxygen at home.   Increase activity slowly   Complete by: As directed       Allergies as of 02/23/2024  Reactions   Levofloxacin Anaphylaxis   Arm also turned red where IV was place   Penicillins Other (See Comments)   convulsions   Doxycycline Nausea And Vomiting        Medication List     TAKE these medications     acetaminophen 500 MG tablet Commonly known as: TYLENOL Take 500 mg by mouth every 6 (six) hours as needed for fever or mild pain.   albuterol (2.5 MG/3ML) 0.083% nebulizer solution Commonly known as: PROVENTIL Take 3 mLs (2.5 mg total) by nebulization every 6 (six) hours as needed for wheezing or shortness of breath.   albuterol 108 (90 Base) MCG/ACT inhaler Commonly known as: VENTOLIN HFA Inhale 2 puffs into the lungs every 4 (four) hours as needed for wheezing or shortness of breath.   azithromycin 500 MG tablet Commonly known as: Zithromax Take 1 tablet (500 mg total) by mouth daily for 3 days.   cefUROXime 500 MG tablet Commonly known as: CEFTIN Take 1 tablet (500 mg total) by mouth 2 (two) times daily with a meal for 3 days.   glipiZIDE 5 MG tablet Commonly known as: GLUCOTROL Take 2 tablets (10 mg total) by mouth daily before breakfast.   guaiFENesin-dextromethorphan 100-10 MG/5ML syrup Commonly known as: ROBITUSSIN DM Take 5 mLs by mouth every 4 (four) hours as needed for cough.   Lantus SoloStar 100 UNIT/ML Solostar Pen Generic drug: insulin glargine Inject 10 Units into the skin at bedtime.   metFORMIN 500 MG tablet Commonly known as: GLUCOPHAGE Take 1 tablet (500 mg total) by mouth 2 (two) times daily with a meal. What changed:  when to take this reasons to take this   methocarbamol 500 MG tablet Commonly known as: ROBAXIN Take 500 mg by mouth every 8 (eight) hours as needed for muscle spasms.   nicotine 14 mg/24hr patch Commonly known as: NICODERM CQ - dosed in mg/24 hours Place 1 patch (14 mg total) onto the skin daily as needed (nicotine craving).   ondansetron 4 MG disintegrating tablet Commonly known as: ZOFRAN-ODT 4mg  ODT q4 hours prn nausea/vomit What changed:  how much to take how to take this when to take this reasons to take this   oxyCODONE-acetaminophen 5-325 MG tablet Commonly known as: PERCOCET/ROXICET Take 1 tablet by mouth every  6 (six) hours as needed for moderate pain (pain score 4-6) or severe pain (pain score 7-10).   Ozempic (0.25 or 0.5 MG/DOSE) 2 MG/3ML Sopn Generic drug: Semaglutide(0.25 or 0.5MG /DOS) Inject 1 mg into the skin every Wednesday.   pantoprazole 40 MG tablet Commonly known as: PROTONIX Take 1 tablet (40 mg total) by mouth daily. What changed:  when to take this reasons to take this   rOPINIRole 2 MG tablet Commonly known as: REQUIP Take 2 mg by mouth 3 (three) times daily.   rosuvastatin 40 MG tablet Commonly known as: CRESTOR Take 40 mg by mouth daily.   SUMAtriptan 50 MG tablet Commonly known as: IMITREX Take 50 mg by mouth as needed for migraine or headache.   Symbicort 80-4.5 MCG/ACT inhaler Generic drug: budesonide-formoterol Inhale 2 puffs into the lungs 2 (two) times daily.        Follow-up Information     Primary care provider Follow up in 1 week(s).                   Time coordinating discharge: 39 minutes  Signed:  Shawny Borkowski  [facility] 02/23/2024, 4:33 PM

## 2024-02-25 LAB — CULTURE, BLOOD (ROUTINE X 2)
Culture: NO GROWTH
Culture: NO GROWTH
Special Requests: ADEQUATE
Special Requests: ADEQUATE

## 2024-03-02 ENCOUNTER — Telehealth: Payer: Self-pay | Admitting: Acute Care

## 2024-03-02 ENCOUNTER — Other Ambulatory Visit: Payer: Self-pay

## 2024-03-02 DIAGNOSIS — F17210 Nicotine dependence, cigarettes, uncomplicated: Secondary | ICD-10-CM

## 2024-03-02 DIAGNOSIS — Z87891 Personal history of nicotine dependence: Secondary | ICD-10-CM

## 2024-03-02 DIAGNOSIS — Z122 Encounter for screening for malignant neoplasm of respiratory organs: Secondary | ICD-10-CM

## 2024-03-02 NOTE — Telephone Encounter (Signed)
 Lung Cancer Screening Narrative/Criteria Questionnaire (Cigarette Smokers Only- No Cigars/Pipes/vapes)   [name]   SDMV:04/11/24 at 2pm [facility]                                           20-Nov-1966                         LDCT: 04/14/24 at 2pm / [facility]    57 y.o.   Phone: [phone]  Lung Screening Narrative (confirm age 30-77 yrs Medicare / 50-80 yrs Private pay insurance)   Insurance information:medicaid   Referring Provider:McCorkle NP   This screening involves an initial phone call with a team member from our program. It is called a shared decision making visit. The initial meeting is required by insurance and Medicare to make sure you understand the program. This appointment takes about 15-20 minutes to complete. The CT scan will completed at a separate date/time. This scan takes about 5-10 minutes to complete and you may eat and drink before and after the scan.  Criteria questions for Lung Cancer Screening:   Are you a current or former smoker? Current Age began smoking: 58 yo   If you are a former smoker, what year did you quit smoking? NA   To calculate your smoking history, I need an accurate estimate of how many packs of cigarettes you smoked per day and for how many years. (Not just the number of PPD you are now smoking)   Years smoking 46 x Packs per day 1 = Pack years 46   (at least 20 pack yrs)   (Make sure they understand that we need to know how much they have smoked in the past, not just the number of PPD they are smoking now)  Do you have a personal history of cancer?  No    Do you have a family history of cancer? Yes  (cancer type and and relative) mother and brother had lung   Are you coughing up blood?  No  Have you had unexplained weight loss of 15 lbs or more in the last 6 months? No  It looks like you meet all criteria.     Additional information: N/A

## 2024-03-06 ENCOUNTER — Other Ambulatory Visit (HOSPITAL_COMMUNITY)

## 2024-03-08 ENCOUNTER — Ambulatory Visit (HOSPITAL_COMMUNITY)
Admission: RE | Admit: 2024-03-08 | Discharge: 2024-03-08 | Disposition: A | Discharge status: Home / Self Care | Source: Ambulatory Visit | Attending: Family Medicine | Admitting: Family Medicine

## 2024-03-08 ENCOUNTER — Other Ambulatory Visit: Payer: Self-pay | Admitting: Orthopedic Surgery

## 2024-03-08 DIAGNOSIS — G8929 Other chronic pain: Secondary | ICD-10-CM

## 2024-03-08 DIAGNOSIS — M19019 Primary osteoarthritis, unspecified shoulder: Secondary | ICD-10-CM

## 2024-03-08 DIAGNOSIS — M19011 Primary osteoarthritis, right shoulder: Secondary | ICD-10-CM | POA: Diagnosis present

## 2024-03-08 MED ORDER — BUPIVACAINE HCL (PF) 0.5 % IJ SOLN
INTRAMUSCULAR | Status: AC
  Filled 2024-03-08: qty 30

## 2024-03-08 MED ORDER — METHYLPREDNISOLONE ACETATE 40 MG/ML IJ SUSP
INTRAMUSCULAR | Status: AC
  Filled 2024-03-08: qty 1

## 2024-03-08 MED ORDER — LIDOCAINE HCL (PF) 1 % IJ SOLN
5.0000 mL | Freq: Once | INTRAMUSCULAR | Status: AC
  Administered 2024-03-08: 5 mL via INTRADERMAL

## 2024-03-08 MED ORDER — METHYLPREDNISOLONE ACETATE 40 MG/ML INJ SUSP (RADIOLOG
40.0000 mg | Freq: Once | INTRAMUSCULAR | Status: AC
  Administered 2024-03-08: 40 mg via INTRA_ARTICULAR

## 2024-03-08 MED ORDER — BUPIVACAINE HCL (PF) 0.5 % IJ SOLN
30.0000 mL | Freq: Once | INTRAMUSCULAR | Status: AC
  Administered 2024-03-08: 30 mL

## 2024-03-08 MED ORDER — LIDOCAINE HCL (PF) 1 % IJ SOLN
INTRAMUSCULAR | Status: AC
  Filled 2024-03-08: qty 5

## 2024-03-08 MED ORDER — IOHEXOL 180 MG/ML  SOLN
10.0000 mL | Freq: Once | INTRAMUSCULAR | Status: AC | PRN
  Administered 2024-03-08: 10 mL via INTRA_ARTICULAR

## 2024-04-10 ENCOUNTER — Telehealth: Payer: Self-pay | Admitting: Emergency Medicine

## 2024-04-10 ENCOUNTER — Ambulatory Visit: Admitting: *Deleted

## 2024-04-10 DIAGNOSIS — F17210 Nicotine dependence, cigarettes, uncomplicated: Secondary | ICD-10-CM | POA: Diagnosis not present

## 2024-04-10 NOTE — Progress Notes (Signed)
 Virtual Visit via Telephone Note  I connected with [name] on 04/10/24 at  1:00 PM EDT by telephone and verified that I am speaking with the correct person using two identifiers.  Location: Patient: [name] Provider: Alyse Bach, RN   I discussed the limitations, risks, security and privacy concerns of performing an evaluation and management service by telephone and the availability of in person appointments. I also discussed with the patient that there may be a patient responsible charge related to this service. The patient expressed understanding and agreed to proceed.    Shared Decision Making Visit Lung Cancer Screening Program [phone])   Eligibility: Age 58 y.o. Pack Years Smoking History Calculation 46 (# packs/per year x # years smoked) Recent History of coughing up blood  sometimes Unexplained weight loss? no ( >Than 15 pounds within the last 6 months ) Prior History Lung / other cancer no (Diagnosis within the last 5 years already requiring surveillance chest CT Scans). Smoking Status Current Smoker Former Smokers: Years since quit: n/a  Quit Date: n/a  Visit Components: Discussion included one or more decision making aids. yes Discussion included risk/benefits of screening. yes Discussion included potential follow up diagnostic testing for abnormal scans. yes Discussion included meaning and risk of over diagnosis. yes Discussion included meaning and risk of False Positives. yes Discussion included meaning of total radiation exposure. yes  Counseling Included: Importance of adherence to annual lung cancer LDCT screening. yes Impact of comorbidities on ability to participate in the program. yes Ability and willingness to under diagnostic treatment. yes  Smoking Cessation Counseling: Current Smokers:  Discussed importance of smoking cessation. yes Information about tobacco cessation classes and interventions provided to patient. yes Patient  provided with "ticket" for LDCT Scan. no Symptomatic Patient. no  Counseling(Intermediate counseling: > three minutes) [id number] Diagnosis Code: Tobacco Use Z72.0 Asymptomatic Patient yes  Counseling (Intermediate counseling: > three minutes counseling) [id number] Former Smokers:  Discussed the importance of maintaining cigarette abstinence. yes Diagnosis Code: Personal History of Nicotine  Dependence. U98.119 Information about tobacco cessation classes and interventions provided to patient. Yes Patient provided with "ticket" for LDCT Scan. no Written Order for Lung Cancer Screening with LDCT placed in Epic. Yes (CT Chest Lung Cancer Screening Low Dose W/O CM) [id number] Z12.2-Screening of respiratory organs Z87.891-Personal history of nicotine  dependence   Alyse Bach, RN

## 2024-04-10 NOTE — Telephone Encounter (Signed)
 Copied from CRM [id number]. Topic: Appointments - Scheduling Inquiry for Clinic >> Apr 07, 2024  2:08 PM [name] wrote: Reason for CRM: Patient states her appointment for 5/19 was suppose to be scheduled for 5/29 - she doesn't complete her CT until 5/23.   SHARED DECISION VISIT [facility]-LUNG CANCER SCREENING NURSE VISIT     Please call to reschedule appointment. >> Apr 10, 2024 10:25 AM Crist Dominion wrote: Patients is requesting an update on this matter as her appointment for today has still not been rescheduled. Please call patient back and advise.       Called and left VM for pt. Advised on the VM for her to keep her upcoming CT scan but if she needs to reschedule to call our dept back, direct LCS line given on VM.

## 2024-04-10 NOTE — Patient Instructions (Signed)

## 2024-04-11 ENCOUNTER — Encounter: Admitting: Acute Care

## 2024-04-14 ENCOUNTER — Ambulatory Visit (HOSPITAL_COMMUNITY)
Admission: RE | Admit: 2024-04-14 | Discharge: 2024-04-14 | Disposition: A | Discharge status: Home / Self Care | Source: Ambulatory Visit | Attending: Acute Care | Admitting: Acute Care

## 2024-04-14 DIAGNOSIS — Z122 Encounter for screening for malignant neoplasm of respiratory organs: Secondary | ICD-10-CM | POA: Diagnosis present

## 2024-04-14 DIAGNOSIS — F17210 Nicotine dependence, cigarettes, uncomplicated: Secondary | ICD-10-CM | POA: Insufficient documentation

## 2024-04-14 DIAGNOSIS — Z87891 Personal history of nicotine dependence: Secondary | ICD-10-CM | POA: Insufficient documentation

## 2024-04-20 ENCOUNTER — Telehealth: Payer: Self-pay

## 2024-04-20 NOTE — Telephone Encounter (Signed)
 Copied from CRM [id number]. Topic: Clinical - Lab/Test Results >> Apr 20, 2024 11:18 AM [name] wrote: Reason for CRM: Patient would like a callback regarding her CT results

## 2024-05-03 ENCOUNTER — Telehealth: Payer: Self-pay

## 2024-05-03 ENCOUNTER — Other Ambulatory Visit: Payer: Self-pay

## 2024-05-03 ENCOUNTER — Telehealth: Payer: Self-pay | Admitting: Acute Care

## 2024-05-03 DIAGNOSIS — F17210 Nicotine dependence, cigarettes, uncomplicated: Secondary | ICD-10-CM

## 2024-05-03 DIAGNOSIS — R911 Solitary pulmonary nodule: Secondary | ICD-10-CM

## 2024-05-03 DIAGNOSIS — Z87891 Personal history of nicotine dependence: Secondary | ICD-10-CM

## 2024-05-03 DIAGNOSIS — Z122 Encounter for screening for malignant neoplasm of respiratory organs: Secondary | ICD-10-CM

## 2024-05-03 NOTE — Telephone Encounter (Signed)
 LVM and message to mychart to call office and review CT results.

## 2024-05-03 NOTE — Telephone Encounter (Signed)
 Call Report from Tiffany:  IMPRESSION: 1. Lung-RADS 4A, suspicious. 8.9 mm confluent nodular opacity in the medial left upper lobe.Follow up low-dose chest CT without contrast in 3 months (please use the following order, CT CHEST LCS NODULE FOLLOW-UP W/O CM) is recommended. Alternatively, PET may be considered when there is a solid component 8mm or larger. 2. Diffuse bronchial wall thickening and irregularity with scattered ground-glass and confluent nodular opacities in both lungs. 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 8.9 mm confluent nodular opacity of concern in the medial left upper lobe may well be infectious/inflammatory but warrants close follow-up. 3. Aortic Atherosclerosis (ICD10-I70.0) and Emphysema (ICD10-J43.9).

## 2024-05-03 NOTE — Telephone Encounter (Signed)
 Spoke with patient. She is in agreement to repeat scan in 3 months to evaluate stability of the 8.9 mm nodule. Order placed. She requested to be called back in July to schedule August f/u scan. Results and plan sent to PCP.

## 2024-05-03 NOTE — Telephone Encounter (Signed)
 I have reviewed the scan images. This scan is good for a 3 month follow up.  Aortic atherosclerosis and Emphysema  She is on a statin. Thanks so much ladies.

## 2024-06-26 ENCOUNTER — Emergency Department (HOSPITAL_COMMUNITY)

## 2024-06-26 ENCOUNTER — Other Ambulatory Visit: Payer: Self-pay

## 2024-06-26 ENCOUNTER — Encounter (HOSPITAL_COMMUNITY): Payer: Self-pay | Admitting: *Deleted

## 2024-06-26 ENCOUNTER — Emergency Department (HOSPITAL_COMMUNITY)
Admission: EM | Admit: 2024-06-26 | Discharge: 2024-06-27 | Disposition: A | Discharge status: Home / Self Care | Attending: Emergency Medicine | Admitting: Emergency Medicine

## 2024-06-26 DIAGNOSIS — Z7984 Long term (current) use of oral hypoglycemic drugs: Secondary | ICD-10-CM | POA: Diagnosis not present

## 2024-06-26 DIAGNOSIS — K5900 Constipation, unspecified: Secondary | ICD-10-CM | POA: Diagnosis not present

## 2024-06-26 DIAGNOSIS — R109 Unspecified abdominal pain: Secondary | ICD-10-CM

## 2024-06-26 DIAGNOSIS — K439 Ventral hernia without obstruction or gangrene: Secondary | ICD-10-CM | POA: Insufficient documentation

## 2024-06-26 DIAGNOSIS — R1030 Lower abdominal pain, unspecified: Secondary | ICD-10-CM | POA: Diagnosis present

## 2024-06-26 DIAGNOSIS — J449 Chronic obstructive pulmonary disease, unspecified: Secondary | ICD-10-CM | POA: Insufficient documentation

## 2024-06-26 LAB — CBC
HCT: 43 % (ref 36.0–46.0)
Hemoglobin: 13.4 g/dL (ref 12.0–15.0)
MCH: 27.4 pg (ref 26.0–34.0)
MCHC: 31.2 g/dL (ref 30.0–36.0)
MCV: 87.9 fL (ref 80.0–100.0)
Platelets: 301 K/uL (ref 150–400)
RBC: 4.89 MIL/uL (ref 3.87–5.11)
RDW: 12.5 % (ref 11.5–15.5)
WBC: 9.3 K/uL (ref 4.0–10.5)
nRBC: 0 % (ref 0.0–0.2)

## 2024-06-26 LAB — POC OCCULT BLOOD, ED: Fecal Occult Blood: NEGATIVE

## 2024-06-26 LAB — COMPREHENSIVE METABOLIC PANEL WITH GFR
ALT: 12 U/L (ref 0–44)
AST: 16 U/L (ref 15–41)
Albumin: 3.6 g/dL (ref 3.5–5.0)
Alkaline Phosphatase: 92 U/L (ref 38–126)
Anion gap: 11 (ref 5–15)
BUN: 13 mg/dL (ref 6–20)
CO2: 36 mmol/L — ABNORMAL HIGH (ref 22–32)
Calcium: 8.8 mg/dL — ABNORMAL LOW (ref 8.9–10.3)
Chloride: 90 mmol/L — ABNORMAL LOW (ref 98–111)
Creatinine, Ser: 0.6 mg/dL (ref 0.44–1.00)
GFR, Estimated: >60 mL/min (ref >60)
Glucose, Bld: 167 mg/dL — ABNORMAL HIGH (ref 70–99)
Potassium: 3.5 mmol/L (ref 3.5–5.1)
Sodium: 137 mmol/L (ref 135–145)
Total Bilirubin: 0.3 mg/dL (ref 0.0–1.2)
Total Protein: 7.4 g/dL (ref 6.5–8.1)

## 2024-06-26 LAB — LIPASE, BLOOD: Lipase: 33 U/L (ref 11–51)

## 2024-06-26 MED ORDER — HYDROMORPHONE HCL 1 MG/ML IJ SOLN
0.5000 mg | Freq: Once | INTRAMUSCULAR | Status: AC
  Administered 2024-06-26: 0.5 mg via INTRAVENOUS
  Filled 2024-06-26: qty 0.5

## 2024-06-26 MED ORDER — ONDANSETRON HCL 4 MG/2ML IJ SOLN
4.0000 mg | Freq: Once | INTRAMUSCULAR | Status: AC
  Administered 2024-06-26: 4 mg via INTRAVENOUS
  Filled 2024-06-26: qty 2

## 2024-06-26 MED ORDER — IOHEXOL 300 MG/ML  SOLN
75.0000 mL | Freq: Once | INTRAMUSCULAR | Status: AC | PRN
  Administered 2024-06-26: 75 mL via INTRAVENOUS

## 2024-06-26 NOTE — ED Triage Notes (Signed)
 Pt BIB EMS for constipated, LBM over a week. Pt with umbilical hernia for a month. + nausea + HA

## 2024-06-26 NOTE — ED Notes (Signed)
 ED Provider at bedside.

## 2024-06-26 NOTE — ED Provider Notes (Signed)
 [place] EMERGENCY DEPARTMENT AT [facility] Provider Note   CSN: [id number] Arrival date & time: 06/26/24  1851     Patient presents with: Abdominal Pain   [name] is a 58 y.o. female.  {Add pertinent medical, surgical, social history, OB history to HPI:7572} 58 year old female with history of COPD on 3 L nasal cannula, C-section, tubal ligation, and cholecystectomy presents emergency department with abdominal pain and constipation.  Patient reports that over the past week she has not had a bowel movement.  Says that yesterday she passed some small dark appearing blood clots from her rectum and has had decreased flatus.  Says that she is having some lower abdominal pain and feels like something is blocked.  Nausea but no vomiting.  No fevers.       Prior to Admission medications   Medication Sig Start Date End Date Taking? Authorizing Provider  acetaminophen  (TYLENOL ) 500 MG tablet Take 500 mg by mouth every 6 (six) hours as needed for fever or mild pain.    [provider]  albuterol  (PROVENTIL ) (2.5 MG/3ML) 0.083% nebulizer solution Take 3 mLs (2.5 mg total) by nebulization every 6 (six) hours as needed for wheezing or shortness of breath. 03/13/22   Pearlean Manus, MD  albuterol  (VENTOLIN  HFA) 108 (90 Base) MCG/ACT inhaler Inhale 2 puffs into the lungs every 4 (four) hours as needed for wheezing or shortness of breath. 03/13/22   Pearlean Manus, MD  glipiZIDE  (GLUCOTROL ) 5 MG tablet Take 2 tablets (10 mg total) by mouth daily before breakfast. 04/23/23   Tat, Alm, MD  guaiFENesin -dextromethorphan  (ROBITUSSIN DM) 100-10 MG/5ML syrup Take 5 mLs by mouth every 4 (four) hours as needed for cough. 02/23/24   Pokhrel, Laxman, MD  LANTUS  SOLOSTAR 100 UNIT/ML Solostar Pen Inject 10 Units into the skin at bedtime. 01/25/24   [provider]  metFORMIN  (GLUCOPHAGE ) 500 MG tablet Take 1 tablet (500 mg total) by mouth 2 (two) times daily with a meal. Patient  taking differently: Take 500 mg by mouth daily as needed (for BG greater than 200). 04/23/23   Evonnie Alm, MD  methocarbamol  (ROBAXIN ) 500 MG tablet Take 500 mg by mouth every 8 (eight) hours as needed for muscle spasms. 08/13/21   [provider]  nicotine  (NICODERM CQ  - DOSED IN MG/24 HOURS) 14 mg/24hr patch Place 1 patch (14 mg total) onto the skin daily as needed (nicotine  craving). 02/23/24   Pokhrel, Laxman, MD  ondansetron  (ZOFRAN -ODT) 4 MG disintegrating tablet 4mg  ODT q4 hours prn nausea/vomit Patient taking differently: Take 4 mg by mouth every 4 (four) hours as needed for nausea or vomiting. 4mg  ODT q4 hours prn nausea/vomit 04/19/22   Zammit, Joseph, MD  oxyCODONE -acetaminophen  (PERCOCET/ROXICET) 5-325 MG tablet Take 1 tablet by mouth every 6 (six) hours as needed for moderate pain (pain score 4-6) or severe pain (pain score 7-10). 02/23/24   Pokhrel, Laxman, MD  OZEMPIC, 0.25 OR 0.5 MG/DOSE, 2 MG/3ML SOPN Inject 1 mg into the skin every Wednesday. 12/28/23   [provider]  pantoprazole  (PROTONIX ) 40 MG tablet Take 1 tablet (40 mg total) by mouth daily. Patient taking differently: Take 40 mg by mouth daily as needed (for acid reflux). 03/13/22   Pearlean Manus, MD  rOPINIRole  (REQUIP ) 2 MG tablet Take 2 mg by mouth 3 (three) times daily. 03/09/23   [provider]  rosuvastatin  (CRESTOR ) 40 MG tablet Take 40 mg by mouth daily. 08/17/23   [provider]  SUMAtriptan  (IMITREX )  50 MG tablet Take 50 mg by mouth as needed for migraine or headache. 11/29/21   [provider]  SYMBICORT  80-4.5 MCG/ACT inhaler Inhale 2 puffs into the lungs 2 (two) times daily. 12/28/23   [provider]  ARIPiprazole  (ABILIFY ) 5 MG tablet Take 5 mg by mouth daily.    02/03/12  [provider]  venlafaxine  (EFFEXOR ) 75 MG tablet Take 75 mg by mouth daily.    02/03/12  [provider]    Allergies: Levofloxacin , Penicillins, and Doxycycline     Review of  Systems  Updated Vital Signs BP 132/85 (BP Location: Left Arm)   Pulse (!) 107   Temp 97.9 F (36.6 C) (Oral)   Resp 17   Ht 5' (1.524 m)   Wt 61 kg   SpO2 92%   BMI 26.26 kg/m   Physical Exam Vitals and nursing note reviewed.  Constitutional:      General: She is not in acute distress.    Appearance: She is well-developed.  HENT:     Head: Normocephalic and atraumatic.     Right Ear: External ear normal.     Left Ear: External ear normal.     Nose: Nose normal.  Eyes:     Extraocular Movements: Extraocular movements intact.     Conjunctiva/sclera: Conjunctivae normal.     Pupils: Pupils are equal, round, and reactive to light.  Abdominal:     General: Abdomen is flat. There is no distension.     Palpations: Abdomen is soft. There is no mass.     Tenderness: There is abdominal tenderness (Diffuse). There is no guarding.     Hernia: A hernia (Reducible ventral) is present.  Genitourinary:    Comments: Chaperoned by patient's RN Maci. No external hemorrhoids or fissures noted on external inspection. No internal masses or hemorroids noted. Normal rectal tone. Brown stool in rectal vault. No melena or gross blood.  Musculoskeletal:     Cervical back: Normal range of motion and neck supple.  Skin:    General: Skin is warm and dry.  Neurological:     Mental Status: She is alert and oriented to person, place, and time. Mental status is at baseline.  Psychiatric:        Mood and Affect: Mood normal.     (all labs ordered are listed, but only abnormal results are displayed) Labs Reviewed  COMPREHENSIVE METABOLIC PANEL WITH GFR - Abnormal; Notable for the following components:      Result Value   Chloride 90 (*)    CO2 36 (*)    Glucose, Bld 167 (*)    Calcium  8.8 (*)    All other components within normal limits  LIPASE, BLOOD  CBC  URINALYSIS, ROUTINE W REFLEX MICROSCOPIC    EKG: None  Radiology: No results found.  {Document cardiac monitor, telemetry  assessment procedure when appropriate:32947} Procedures   Medications Ordered in the ED - No data to display    {Click here for ABCD2, HEART and other calculators REFRESH Note before signing:1}                              Medical Decision Making Amount and/or Complexity of Data Reviewed Labs: ordered. Radiology: ordered.  Risk Prescription drug management.   ***  {Document critical care time when appropriate  Document review of labs and clinical decision tools ie CHADS2VASC2, etc  Document your independent review of radiology images and any  outside records  Document your discussion with family members, caretakers and with consultants  Document social determinants of health affecting pt's care  Document your decision making why or why not admission, treatments were needed:32947:::1}   Final diagnoses:  None    ED Discharge Orders     None

## 2024-06-27 MED ORDER — CEFPODOXIME PROXETIL 100 MG PO TABS: 100.0000 mg | ORAL_TABLET | Freq: Two times a day (BID) | ORAL | 0 refills | Status: AC

## 2024-06-27 MED ORDER — POLYETHYLENE GLYCOL 3350 17 G PO PACK: 17.0000 g | PACK | Freq: Two times a day (BID) | ORAL | 0 refills | Status: DC

## 2024-06-27 MED ORDER — GLYCERIN (ADULT) 2 G RE SUPP: 1.0000 | RECTAL | 0 refills | Status: DC | PRN

## 2024-06-27 NOTE — Discharge Instructions (Signed)
 You were seen for your abdominal pain and constipation in the emergency department.   At home, please take the MiraLAX  twice a day until you are having regular bowel movements.  You may also use a suppository that we have prescribed you as well.    Check your MyChart online for the results of any tests that had not resulted by the time you left the emergency department.   Follow-up with your primary doctor in 2-3 days regarding your visit.    Return immediately to the emergency department if you experience any of the following: Worsening pain, vomiting, or any other concerning symptoms.    Thank you for visiting our Emergency Department. It was a pleasure taking care of you today.

## 2024-06-27 NOTE — ED Provider Notes (Signed)
  Physical Exam  BP 121/72   Pulse 98   Temp 97.9 F (36.6 C) (Oral)   Resp 18   Ht 5' (1.524 m)   Wt 61 kg   SpO2 95%   BMI 26.26 kg/m   Physical Exam Vitals and nursing note reviewed.  Constitutional:      General: She is not in acute distress.    Appearance: She is well-developed.  HENT:     Head: Normocephalic and atraumatic.  Eyes:     Conjunctiva/sclera: Conjunctivae normal.  Pulmonary:     Effort: Pulmonary effort is normal. No respiratory distress.  Musculoskeletal:        General: No swelling.     Cervical back: Neck supple.  Skin:    General: Skin is warm and dry.     Capillary Refill: Capillary refill takes less than 2 seconds.  Neurological:     Mental Status: She is alert.  Psychiatric:        Mood and Affect: Mood normal.     Procedures  Procedures  ED Course / MDM   Clinical Course as of 06/27/24 0236  Tue Jun 27, 2024  0033 Signed out to Dr. Albertina [RP]    Clinical Course User Index [RP] [name], MD   Medical Decision Making Amount and/or Complexity of Data Reviewed Labs: ordered. Radiology: ordered.  Risk OTC drugs. Prescription drug management.   Patient received in handoff.  Abdominal pain pending CT.  CT abdomen pelvis with no acute findings, does show evidence of constipation and an increased patchy opacity in the lateral basal right lobe.  Will start empirically on Vantin  given multiple penicillin-based allergies as well as fluoroquinolone and doxycycline .  Patient does not meet inpatient criteria for admission will be discharged       Albertina Dixon, MD 06/27/24 [phone]

## 2024-07-17 ENCOUNTER — Ambulatory Visit (HOSPITAL_COMMUNITY): Admission: RE | Admit: 2024-07-17 | Source: Ambulatory Visit

## 2024-07-17 ENCOUNTER — Telehealth: Payer: Self-pay | Admitting: Acute Care

## 2024-07-17 NOTE — Telephone Encounter (Signed)
 Returned call from VM to reschedule LDCT - missed appt.  Unable to reach. Left VM

## 2024-07-19 ENCOUNTER — Telehealth: Payer: Self-pay

## 2024-07-19 NOTE — Telephone Encounter (Signed)
 Pt was a no show for 8/25 Lung CT. States her alarm did not go off. She has been rescheduled for 07/27/2024 at 5:30 pm at [facility].

## 2024-07-26 ENCOUNTER — Encounter (INDEPENDENT_AMBULATORY_CARE_PROVIDER_SITE_OTHER): Payer: Self-pay | Admitting: *Deleted

## 2024-07-27 ENCOUNTER — Ambulatory Visit (HOSPITAL_COMMUNITY)

## 2024-07-27 ENCOUNTER — Ambulatory Visit (HOSPITAL_COMMUNITY)
Admission: RE | Admit: 2024-07-27 | Discharge: 2024-07-27 | Disposition: A | Discharge status: Home / Self Care | Source: Ambulatory Visit | Attending: Acute Care | Admitting: Acute Care

## 2024-07-27 DIAGNOSIS — R911 Solitary pulmonary nodule: Secondary | ICD-10-CM | POA: Insufficient documentation

## 2024-07-27 DIAGNOSIS — F17210 Nicotine dependence, cigarettes, uncomplicated: Secondary | ICD-10-CM | POA: Diagnosis present

## 2024-07-27 DIAGNOSIS — Z122 Encounter for screening for malignant neoplasm of respiratory organs: Secondary | ICD-10-CM | POA: Diagnosis present

## 2024-07-27 DIAGNOSIS — Z87891 Personal history of nicotine dependence: Secondary | ICD-10-CM | POA: Insufficient documentation

## 2024-08-06 ENCOUNTER — Other Ambulatory Visit: Payer: Self-pay

## 2024-08-06 ENCOUNTER — Encounter (HOSPITAL_COMMUNITY): Payer: Self-pay | Admitting: Internal Medicine

## 2024-08-06 ENCOUNTER — Inpatient Hospital Stay (HOSPITAL_COMMUNITY)
Admission: EM | Admit: 2024-08-06 | Discharge: 2024-08-11 | DRG: 190 | Disposition: A | Discharge status: Home / Self Care | Attending: Internal Medicine | Admitting: Internal Medicine

## 2024-08-06 ENCOUNTER — Emergency Department (HOSPITAL_COMMUNITY)

## 2024-08-06 DIAGNOSIS — B9789 Other viral agents as the cause of diseases classified elsewhere: Secondary | ICD-10-CM | POA: Diagnosis present

## 2024-08-06 DIAGNOSIS — E871 Hypo-osmolality and hyponatremia: Secondary | ICD-10-CM | POA: Diagnosis not present

## 2024-08-06 DIAGNOSIS — Z7984 Long term (current) use of oral hypoglycemic drugs: Secondary | ICD-10-CM

## 2024-08-06 DIAGNOSIS — G894 Chronic pain syndrome: Secondary | ICD-10-CM | POA: Diagnosis present

## 2024-08-06 DIAGNOSIS — J441 Chronic obstructive pulmonary disease with (acute) exacerbation: Secondary | ICD-10-CM | POA: Diagnosis not present

## 2024-08-06 DIAGNOSIS — Z881 Allergy status to other antibiotic agents status: Secondary | ICD-10-CM

## 2024-08-06 DIAGNOSIS — J9611 Chronic respiratory failure with hypoxia: Secondary | ICD-10-CM | POA: Diagnosis present

## 2024-08-06 DIAGNOSIS — Z7985 Long-term (current) use of injectable non-insulin antidiabetic drugs: Secondary | ICD-10-CM

## 2024-08-06 DIAGNOSIS — I10 Essential (primary) hypertension: Secondary | ICD-10-CM | POA: Diagnosis present

## 2024-08-06 DIAGNOSIS — Z79899 Other long term (current) drug therapy: Secondary | ICD-10-CM

## 2024-08-06 DIAGNOSIS — Z794 Long term (current) use of insulin: Secondary | ICD-10-CM

## 2024-08-06 DIAGNOSIS — G43909 Migraine, unspecified, not intractable, without status migrainosus: Secondary | ICD-10-CM | POA: Diagnosis present

## 2024-08-06 DIAGNOSIS — E1165 Type 2 diabetes mellitus with hyperglycemia: Secondary | ICD-10-CM | POA: Diagnosis present

## 2024-08-06 DIAGNOSIS — E119 Type 2 diabetes mellitus without complications: Secondary | ICD-10-CM

## 2024-08-06 DIAGNOSIS — J9612 Chronic respiratory failure with hypercapnia: Secondary | ICD-10-CM | POA: Diagnosis present

## 2024-08-06 DIAGNOSIS — Z809 Family history of malignant neoplasm, unspecified: Secondary | ICD-10-CM

## 2024-08-06 DIAGNOSIS — Z1152 Encounter for screening for COVID-19: Secondary | ICD-10-CM

## 2024-08-06 DIAGNOSIS — J189 Pneumonia, unspecified organism: Secondary | ICD-10-CM | POA: Diagnosis present

## 2024-08-06 DIAGNOSIS — F17210 Nicotine dependence, cigarettes, uncomplicated: Secondary | ICD-10-CM | POA: Diagnosis present

## 2024-08-06 DIAGNOSIS — Z23 Encounter for immunization: Secondary | ICD-10-CM

## 2024-08-06 DIAGNOSIS — Z833 Family history of diabetes mellitus: Secondary | ICD-10-CM

## 2024-08-06 DIAGNOSIS — J440 Chronic obstructive pulmonary disease with acute lower respiratory infection: Secondary | ICD-10-CM | POA: Diagnosis present

## 2024-08-06 DIAGNOSIS — B9710 Unspecified enterovirus as the cause of diseases classified elsewhere: Secondary | ICD-10-CM | POA: Diagnosis present

## 2024-08-06 DIAGNOSIS — Z8249 Family history of ischemic heart disease and other diseases of the circulatory system: Secondary | ICD-10-CM

## 2024-08-06 DIAGNOSIS — Z7951 Long term (current) use of inhaled steroids: Secondary | ICD-10-CM

## 2024-08-06 DIAGNOSIS — Z720 Tobacco use: Secondary | ICD-10-CM

## 2024-08-06 DIAGNOSIS — R6510 Systemic inflammatory response syndrome (SIRS) of non-infectious origin without acute organ dysfunction: Secondary | ICD-10-CM | POA: Diagnosis present

## 2024-08-06 DIAGNOSIS — Z9981 Dependence on supplemental oxygen: Secondary | ICD-10-CM

## 2024-08-06 DIAGNOSIS — Z880 Allergy status to penicillin: Secondary | ICD-10-CM

## 2024-08-06 DIAGNOSIS — K219 Gastro-esophageal reflux disease without esophagitis: Secondary | ICD-10-CM | POA: Diagnosis present

## 2024-08-06 LAB — GLUCOSE, CAPILLARY
Glucose-Capillary: 238 mg/dL — ABNORMAL HIGH (ref 70–99)
Glucose-Capillary: 388 mg/dL — ABNORMAL HIGH (ref 70–99)
Glucose-Capillary: 258 mg/dL — ABNORMAL HIGH (ref 70–99)

## 2024-08-06 LAB — CBC WITH DIFFERENTIAL/PLATELET
Abs Immature Granulocytes: 0.05 K/uL (ref 0.00–0.07)
Basophils Absolute: 0 K/uL (ref 0.0–0.1)
Basophils Relative: 0 %
Eosinophils Absolute: 0.3 K/uL (ref 0.0–0.5)
Eosinophils Relative: 2 %
HCT: 41.4 % (ref 36.0–46.0)
Hemoglobin: 12.8 g/dL (ref 12.0–15.0)
Immature Granulocytes: 0 %
Lymphocytes Relative: 9 %
Lymphs Abs: 1.2 K/uL (ref 0.7–4.0)
MCH: 27 pg (ref 26.0–34.0)
MCHC: 30.9 g/dL (ref 30.0–36.0)
MCV: 87.3 fL (ref 80.0–100.0)
Monocytes Absolute: 0.9 K/uL (ref 0.1–1.0)
Monocytes Relative: 6 %
Neutro Abs: 11.4 K/uL — ABNORMAL HIGH (ref 1.7–7.7)
Neutrophils Relative %: 83 %
Platelets: 303 K/uL (ref 150–400)
RBC: 4.74 MIL/uL (ref 3.87–5.11)
RDW: 12.9 % (ref 11.5–15.5)
WBC: 13.8 K/uL — ABNORMAL HIGH (ref 4.0–10.5)
nRBC: 0 % (ref 0.0–0.2)

## 2024-08-06 LAB — COMPREHENSIVE METABOLIC PANEL WITH GFR
ALT: 12 U/L (ref 0–44)
AST: 15 U/L (ref 15–41)
Albumin: 3.7 g/dL (ref 3.5–5.0)
Alkaline Phosphatase: 93 U/L (ref 38–126)
Anion gap: 10 (ref 5–15)
BUN: 8 mg/dL (ref 6–20)
CO2: 30 mmol/L (ref 22–32)
Calcium: 8.1 mg/dL — ABNORMAL LOW (ref 8.9–10.3)
Chloride: 88 mmol/L — ABNORMAL LOW (ref 98–111)
Creatinine, Ser: 0.33 mg/dL — ABNORMAL LOW (ref 0.44–1.00)
GFR, Estimated: >60 mL/min (ref >60)
Glucose, Bld: 227 mg/dL — ABNORMAL HIGH (ref 70–99)
Potassium: 3.6 mmol/L (ref 3.5–5.1)
Sodium: 128 mmol/L — ABNORMAL LOW (ref 135–145)
Total Bilirubin: 0.5 mg/dL (ref 0.0–1.2)
Total Protein: 7.7 g/dL (ref 6.5–8.1)

## 2024-08-06 LAB — RESP PANEL BY RT-PCR (RSV, FLU A&B, COVID)  RVPGX2
Influenza A by PCR: NEGATIVE
Influenza B by PCR: NEGATIVE
Resp Syncytial Virus by PCR: NEGATIVE
SARS Coronavirus 2 by RT PCR: NEGATIVE

## 2024-08-06 LAB — BRAIN NATRIURETIC PEPTIDE: B Natriuretic Peptide: 47 pg/mL (ref 0.0–100.0)

## 2024-08-06 LAB — HEMOGLOBIN A1C
Hgb A1c MFr Bld: 7 % — ABNORMAL HIGH (ref 4.8–5.6)
Mean Plasma Glucose: 154.2 mg/dL

## 2024-08-06 LAB — TROPONIN I (HIGH SENSITIVITY)
Troponin I (High Sensitivity): 3 ng/L (ref <18)
Troponin I (High Sensitivity): 4 ng/L (ref <18)

## 2024-08-06 LAB — BLOOD GAS, VENOUS
Acid-Base Excess: 10.4 mmol/L — ABNORMAL HIGH (ref 0.0–2.0)
Bicarbonate: 39.5 mmol/L — ABNORMAL HIGH (ref 20.0–28.0)
Drawn by: 2716
O2 Saturation: 82.2 %
Patient temperature: 37.7
pCO2, Ven: 77 mmHg (ref 44–60)
pH, Ven: 7.32 (ref 7.25–7.43)
pO2, Ven: 48 mmHg — ABNORMAL HIGH (ref 32–45)

## 2024-08-06 LAB — LACTIC ACID, PLASMA
Lactic Acid, Venous: 1.7 mmol/L (ref 0.5–1.9)
Lactic Acid, Venous: 0.9 mmol/L (ref 0.5–1.9)

## 2024-08-06 MED ORDER — METHYLPREDNISOLONE SODIUM SUCC 125 MG IJ SOLR
60.0000 mg | Freq: Two times a day (BID) | INTRAMUSCULAR | Status: AC
  Administered 2024-08-07 (×2): 60 mg via INTRAVENOUS
  Filled 2024-08-06 (×2): qty 2

## 2024-08-06 MED ORDER — SODIUM CHLORIDE 0.9 % IV BOLUS
1000.0000 mL | Freq: Once | INTRAVENOUS | Status: AC
  Administered 2024-08-06: 1000 mL via INTRAVENOUS

## 2024-08-06 MED ORDER — GUAIFENESIN-DM 100-10 MG/5ML PO SYRP
15.0000 mL | ORAL_SOLUTION | Freq: Three times a day (TID) | ORAL | Status: AC
  Administered 2024-08-06 – 2024-08-07 (×3): 15 mL via ORAL
  Filled 2024-08-06 (×3): qty 15

## 2024-08-06 MED ORDER — LORAZEPAM 2 MG/ML IJ SOLN
0.5000 mg | Freq: Once | INTRAMUSCULAR | Status: AC
  Administered 2024-08-06: 0.5 mg via INTRAVENOUS
  Filled 2024-08-06: qty 1

## 2024-08-06 MED ORDER — NICOTINE 14 MG/24HR TD PT24: 14.0000 mg | MEDICATED_PATCH | Freq: Every day | TRANSDERMAL | Status: DC | PRN

## 2024-08-06 MED ORDER — METHYLPREDNISOLONE SODIUM SUCC 125 MG IJ SOLR
125.0000 mg | Freq: Once | INTRAMUSCULAR | Status: AC
  Administered 2024-08-06: 125 mg via INTRAVENOUS
  Filled 2024-08-06: qty 2

## 2024-08-06 MED ORDER — ENOXAPARIN SODIUM 40 MG/0.4ML IJ SOSY
40.0000 mg | PREFILLED_SYRINGE | INTRAMUSCULAR | Status: DC
  Administered 2024-08-06 – 2024-08-10 (×5): 40 mg via SUBCUTANEOUS
  Filled 2024-08-06 (×5): qty 0.4

## 2024-08-06 MED ORDER — AZITHROMYCIN 250 MG PO TABS: 500.0000 mg | ORAL_TABLET | Freq: Every day | ORAL | Status: DC

## 2024-08-06 MED ORDER — ONDANSETRON HCL 4 MG/2ML IJ SOLN
4.0000 mg | Freq: Four times a day (QID) | INTRAMUSCULAR | Status: DC | PRN
  Administered 2024-08-07 (×3): 4 mg via INTRAVENOUS
  Filled 2024-08-06 (×3): qty 2

## 2024-08-06 MED ORDER — TRAMADOL HCL 50 MG PO TABS
50.0000 mg | ORAL_TABLET | Freq: Three times a day (TID) | ORAL | Status: DC | PRN
  Administered 2024-08-06: 50 mg via ORAL
  Filled 2024-08-06: qty 1

## 2024-08-06 MED ORDER — MORPHINE SULFATE (PF) 4 MG/ML IV SOLN
4.0000 mg | Freq: Once | INTRAVENOUS | Status: AC
  Administered 2024-08-06: 4 mg via INTRAVENOUS
  Filled 2024-08-06: qty 1

## 2024-08-06 MED ORDER — IPRATROPIUM-ALBUTEROL 0.5-2.5 (3) MG/3ML IN SOLN
3.0000 mL | Freq: Three times a day (TID) | RESPIRATORY_TRACT | Status: AC
  Administered 2024-08-06: 3 mL via RESPIRATORY_TRACT
  Filled 2024-08-06 (×2): qty 3

## 2024-08-06 MED ORDER — ONDANSETRON HCL 4 MG/2ML IJ SOLN
4.0000 mg | Freq: Once | INTRAMUSCULAR | Status: AC
  Administered 2024-08-06: 4 mg via INTRAVENOUS
  Filled 2024-08-06: qty 2

## 2024-08-06 MED ORDER — INSULIN ASPART 100 UNIT/ML IJ SOLN
0.0000 [IU] | Freq: Three times a day (TID) | INTRAMUSCULAR | Status: DC
  Administered 2024-08-06: 5 [IU] via SUBCUTANEOUS
  Administered 2024-08-07: 3 [IU] via SUBCUTANEOUS
  Administered 2024-08-07 – 2024-08-08 (×3): 5 [IU] via SUBCUTANEOUS
  Administered 2024-08-08: 8 [IU] via SUBCUTANEOUS

## 2024-08-06 MED ORDER — SODIUM CHLORIDE 0.9 % IV SOLN
INTRAVENOUS | Status: DC
Start: 2024-08-06 — End: 2024-08-07

## 2024-08-06 MED ORDER — INSULIN GLARGINE-YFGN 100 UNIT/ML ~~LOC~~ SOLN
10.0000 [IU] | Freq: Every day | SUBCUTANEOUS | Status: DC
Start: 2024-08-06 — End: 2024-08-07
  Filled 2024-08-06 (×2): qty 0.1

## 2024-08-06 MED ORDER — ACETAMINOPHEN 650 MG RE SUPP: 650.0000 mg | Freq: Four times a day (QID) | RECTAL | Status: DC | PRN

## 2024-08-06 MED ORDER — INSULIN ASPART 100 UNIT/ML IJ SOLN
0.0000 [IU] | Freq: Every day | INTRAMUSCULAR | Status: DC
  Administered 2024-08-06 – 2024-08-07 (×2): 3 [IU] via SUBCUTANEOUS

## 2024-08-06 MED ORDER — IPRATROPIUM-ALBUTEROL 0.5-2.5 (3) MG/3ML IN SOLN
3.0000 mL | RESPIRATORY_TRACT | Status: DC | PRN
  Administered 2024-08-09: 3 mL via RESPIRATORY_TRACT
  Filled 2024-08-06: qty 3

## 2024-08-06 MED ORDER — INSULIN ASPART 100 UNIT/ML IJ SOLN: 0.0000 [IU] | Freq: Three times a day (TID) | INTRAMUSCULAR | Status: DC

## 2024-08-06 MED ORDER — FLUTICASONE FUROATE-VILANTEROL 100-25 MCG/ACT IN AEPB
1.0000 | INHALATION_SPRAY | Freq: Every day | RESPIRATORY_TRACT | Status: DC
  Administered 2024-08-07 – 2024-08-09 (×3): 1 via RESPIRATORY_TRACT
  Filled 2024-08-06: qty 28

## 2024-08-06 MED ORDER — INFLUENZA VIRUS VACC SPLIT PF (FLUZONE) 0.5 ML IM SUSY
0.5000 mL | PREFILLED_SYRINGE | INTRAMUSCULAR | Status: AC
Start: 2024-08-07 — End: 2024-08-11
  Administered 2024-08-11: 0.5 mL via INTRAMUSCULAR
  Filled 2024-08-06: qty 0.5

## 2024-08-06 MED ORDER — MAGNESIUM SULFATE 2 GM/50ML IV SOLN
2.0000 g | Freq: Once | INTRAVENOUS | Status: AC
  Administered 2024-08-06: 2 g via INTRAVENOUS
  Filled 2024-08-06: qty 50

## 2024-08-06 MED ORDER — POLYETHYLENE GLYCOL 3350 17 G PO PACK: 17.0000 g | PACK | Freq: Every day | ORAL | Status: DC | PRN

## 2024-08-06 MED ORDER — IPRATROPIUM-ALBUTEROL 0.5-2.5 (3) MG/3ML IN SOLN
3.0000 mL | Freq: Four times a day (QID) | RESPIRATORY_TRACT | Status: DC
  Administered 2024-08-07 – 2024-08-08 (×7): 3 mL via RESPIRATORY_TRACT
  Filled 2024-08-06 (×7): qty 3

## 2024-08-06 MED ORDER — ONDANSETRON HCL 4 MG PO TABS
4.0000 mg | ORAL_TABLET | Freq: Four times a day (QID) | ORAL | Status: DC | PRN
  Administered 2024-08-06 – 2024-08-10 (×2): 4 mg via ORAL
  Filled 2024-08-06 (×2): qty 1

## 2024-08-06 MED ORDER — ACETAMINOPHEN 325 MG PO TABS
650.0000 mg | ORAL_TABLET | Freq: Four times a day (QID) | ORAL | Status: DC | PRN
  Administered 2024-08-07 – 2024-08-09 (×2): 650 mg via ORAL
  Filled 2024-08-06 (×2): qty 2

## 2024-08-06 MED ORDER — IPRATROPIUM-ALBUTEROL 0.5-2.5 (3) MG/3ML IN SOLN
3.0000 mL | Freq: Once | RESPIRATORY_TRACT | Status: AC
  Administered 2024-08-06: 3 mL via RESPIRATORY_TRACT
  Filled 2024-08-06: qty 3

## 2024-08-06 MED ORDER — ROPINIROLE HCL 1 MG PO TABS
2.0000 mg | ORAL_TABLET | Freq: Three times a day (TID) | ORAL | Status: DC
  Administered 2024-08-06 – 2024-08-11 (×15): 2 mg via ORAL
  Filled 2024-08-06 (×15): qty 2

## 2024-08-06 MED ORDER — SODIUM CHLORIDE 0.9 % IV SOLN
500.0000 mg | INTRAVENOUS | Status: AC
  Administered 2024-08-06: 500 mg via INTRAVENOUS
  Filled 2024-08-06: qty 5

## 2024-08-06 MED ORDER — MORPHINE SULFATE (PF) 2 MG/ML IV SOLN
2.0000 mg | INTRAVENOUS | Status: DC | PRN
  Administered 2024-08-06 – 2024-08-07 (×3): 2 mg via INTRAVENOUS
  Filled 2024-08-06 (×3): qty 1

## 2024-08-06 MED ORDER — PREDNISONE 20 MG PO TABS
40.0000 mg | ORAL_TABLET | Freq: Every day | ORAL | Status: DC
  Administered 2024-08-08 – 2024-08-09 (×2): 40 mg via ORAL
  Filled 2024-08-06 (×2): qty 2

## 2024-08-06 NOTE — H&P (Signed)
 History and Physical    [name] FMW:[id number] DOB: [birth year] DOA: 08/06/2024  PCP: Vick Lurie, FNP   Patient coming from: Home  I have personally briefly reviewed patient's old medical records in [facility]  Chief Complaint: Difficulty breathing  HPI: [name] is a 58 y.o. female with medical history significant for COPD, chronic respiratory failure on 3 L, diabetes mellitus, hypertension, chronic pain. Patient presented to the ED with complaints of difficulty breathing generalized weakness and productive cough-per patient purulent appearing started 3 to 4 days ago.  She also reports sore throat, nasal congestion, and that she lost her voice due to severity of her symptoms.  Reports subjective chills, she did not check her temperature.  She reports nausea but no vomiting no diarrhea.  ED Course: Tmax 99.9.  Heart rate 115.  Respirate rate 18.  Blood pressure 152/59.  O2 sats 94 to 98% on 3 L. WBC 13.8.  COVID influenza RSV negative. Lactic acid 1.7.  VBG with pH of 7.3, pCO2 elevated at 77.  BNP 47. Chest x-ray negative for acute abnormality, suggest respiratory bronchiolitis. DuoNebs, magnesium  2 g, Solu-Medrol  125 mg, 1 L bolus given. Hospitalist to admit for COPD exacerbation.  Review of Systems: As per HPI all other systems reviewed and negative.  Past Medical History:  Diagnosis Date   Angina    Anxiety state 10/15/2015   ARDS (adult respiratory distress syndrome) (HCC)    Jan 2011   Asthma    Chronic back pain    COPD (chronic obstructive pulmonary disease) (HCC)    Diabetes mellitus    Encounter for smoking cessation counseling 02/21/2024   Gallstones with biliary obstruction    GERD (gastroesophageal reflux disease) 12/21/2012   HTN (hypertension) 10/15/2015   Hyperglycemia, drug-induced    steroid induced hyperglycemia   Migraine headache    On home O2    Pneumonia    Recurrent upper respiratory infection (URI)    Shortness of breath      Past Surgical History:  Procedure Laterality Date   BILIARY DILATION  01/04/2024   Procedure: BILIARY DILATION;  Surgeon: Jinny Carmine, MD;  Location: [facility] ENDOSCOPY;  Service: Endoscopy;;   c-section     ENDOSCOPIC RETROGRADE CHOLANGIOPANCREATOGRAPHY (ERCP) WITH PROPOFOL  N/A 01/04/2024   Procedure: ENDOSCOPIC RETROGRADE CHOLANGIOPANCREATOGRAPHY (ERCP) WITH PROPOFOL ;  Surgeon: Jinny Carmine, MD;  Location: [facility] ENDOSCOPY;  Service: Endoscopy;  Laterality: N/A;   REMOVAL OF STONES  01/04/2024   Procedure: REMOVAL OF STONES;  Surgeon: Jinny Carmine, MD;  Location: [facility] ENDOSCOPY;  Service: Endoscopy;;   TRACHEOSTOMY     decannulated 12/2009   TUBAL LIGATION     uterine ablasion       reports that she has been smoking cigarettes. She started smoking about 46 years ago. She has a 46.7 pack-year smoking history. She has never used smokeless tobacco. She reports that she does not drink alcohol and does not use drugs.  Allergies  Allergen Reactions   Levofloxacin  Anaphylaxis    Arm also turned red where IV was place   Penicillins Other (See Comments)    convulsions   Doxycycline  Nausea And Vomiting    Family History  Problem Relation Age of Onset   Coronary artery disease Brother    Diabetes Other    Cancer Other    Hypertension Other     Prior to Admission medications   Medication Sig Start Date End Date Taking? Authorizing Provider  acetaminophen  (TYLENOL ) 500 MG tablet Take 500 mg  by mouth every 6 (six) hours as needed for fever or mild pain.    [provider]  albuterol  (PROVENTIL ) (2.5 MG/3ML) 0.083% nebulizer solution Take 3 mLs (2.5 mg total) by nebulization every 6 (six) hours as needed for wheezing or shortness of breath. 03/13/22   Pearlean Manus, MD  albuterol  (VENTOLIN  HFA) 108 (90 Base) MCG/ACT inhaler Inhale 2 puffs into the lungs every 4 (four) hours as needed for wheezing or shortness of breath. 03/13/22   Pearlean Manus, MD  glipiZIDE  (GLUCOTROL ) 5 MG  tablet Take 2 tablets (10 mg total) by mouth daily before breakfast. 04/23/23   Tat, Alm, MD  glycerin  adult 2 g suppository Place 1 suppository rectally as needed for constipation. 06/27/24   [name], MD  guaiFENesin -dextromethorphan  (ROBITUSSIN DM) 100-10 MG/5ML syrup Take 5 mLs by mouth every 4 (four) hours as needed for cough. 02/23/24   Pokhrel, Laxman, MD  LANTUS  SOLOSTAR 100 UNIT/ML Solostar Pen Inject 10 Units into the skin at bedtime. 01/25/24   [provider]  metFORMIN  (GLUCOPHAGE ) 500 MG tablet Take 1 tablet (500 mg total) by mouth 2 (two) times daily with a meal. Patient taking differently: Take 500 mg by mouth daily as needed (for BG greater than 200). 04/23/23   Evonnie Alm, MD  methocarbamol  (ROBAXIN ) 500 MG tablet Take 500 mg by mouth every 8 (eight) hours as needed for muscle spasms. 08/13/21   [provider]  nicotine  (NICODERM CQ  - DOSED IN MG/24 HOURS) 14 mg/24hr patch Place 1 patch (14 mg total) onto the skin daily as needed (nicotine  craving). 02/23/24   Pokhrel, Laxman, MD  ondansetron  (ZOFRAN -ODT) 4 MG disintegrating tablet 4mg  ODT q4 hours prn nausea/vomit Patient taking differently: Take 4 mg by mouth every 4 (four) hours as needed for nausea or vomiting. 4mg  ODT q4 hours prn nausea/vomit 04/19/22   Zammit, Joseph, MD  oxyCODONE -acetaminophen  (PERCOCET/ROXICET) 5-325 MG tablet Take 1 tablet by mouth every 6 (six) hours as needed for moderate pain (pain score 4-6) or severe pain (pain score 7-10). 02/23/24   Pokhrel, Laxman, MD  OZEMPIC, 0.25 OR 0.5 MG/DOSE, 2 MG/3ML SOPN Inject 1 mg into the skin every Wednesday. 12/28/23   [provider]  pantoprazole  (PROTONIX ) 40 MG tablet Take 1 tablet (40 mg total) by mouth daily. Patient taking differently: Take 40 mg by mouth daily as needed (for acid reflux). 03/13/22   Pearlean Manus, MD  polyethylene glycol (MIRALAX ) 17 g packet Take 17 g by mouth 2 (two) times daily. 06/27/24   [name], MD   rOPINIRole  (REQUIP ) 2 MG tablet Take 2 mg by mouth 3 (three) times daily. 03/09/23   [provider]  rosuvastatin  (CRESTOR ) 40 MG tablet Take 40 mg by mouth daily. 08/17/23   [provider]  SUMAtriptan  (IMITREX ) 50 MG tablet Take 50 mg by mouth as needed for migraine or headache. 11/29/21   [provider]  SYMBICORT  80-4.5 MCG/ACT inhaler Inhale 2 puffs into the lungs 2 (two) times daily. 12/28/23   [provider]  ARIPiprazole  (ABILIFY ) 5 MG tablet Take 5 mg by mouth daily.    02/03/12  [provider]  venlafaxine  (EFFEXOR ) 75 MG tablet Take 75 mg by mouth daily.    02/03/12  [provider]    Physical Exam: Vitals:   08/06/24 1300 08/06/24 1304 08/06/24 1358  BP: (!) 152/59    Pulse: (!) 115    Resp: 18    Temp: 99.9 F (37.7 C)  TempSrc: Oral    SpO2: 94%  98%  Weight:  66.2 kg   Height:  5' (1.524 m)     Constitutional: NAD, calm, comfortable Vitals:   08/06/24 1300 08/06/24 1304 08/06/24 1358  BP: (!) 152/59    Pulse: (!) 115    Resp: 18    Temp: 99.9 F (37.7 C)    TempSrc: Oral    SpO2: 94%  98%  Weight:  66.2 kg   Height:  5' (1.524 m)    Eyes: PERRL, lids and conjunctivae normal ENMT: Mucous membranes are moist. Neck: normal, supple, no masses, no thyromegaly Respiratory: Coarse expiratory and inspiratory rhonchi, no increased work of breathing, no accessory muscle use. Cardiovascular: Regular rate and rhythm, no murmurs / rubs / gallops. No extremity edema. Extremities warm. Abdomen: Supraumbilical hernia, abdomen distended but soft-baseline, no tenderness, no masses palpated. No hepatosplenomegaly.   Musculoskeletal: no clubbing / cyanosis. No joint deformity upper and lower extremities.  Skin: no rashes, lesions, ulcers. No induration Neurologic: No facial asymmetry, moving extremities spontaneously, speech fluent. Psychiatric: Normal judgment and insight. Alert and oriented x 3. Normal mood.   Labs on  Admission: I have personally reviewed following labs and imaging studies  CBC: Recent Labs  Lab 08/06/24 1350  WBC 13.8*  NEUTROABS 11.4*  HGB 12.8  HCT 41.4  MCV 87.3  PLT 303   Basic Metabolic Panel: Recent Labs  Lab 08/06/24 1350  NA 128*  K 3.6  CL 88*  CO2 30  GLUCOSE 227*  BUN 8  CREATININE 0.33*  CALCIUM  8.1*   GFR: Estimated Creatinine Clearance: 67.5 mL/min (A) (by C-G formula based on SCr of 0.33 mg/dL (L)). Liver Function Tests: Recent Labs  Lab 08/06/24 1350  AST 15  ALT 12  ALKPHOS 93  BILITOT 0.5  PROT 7.7  ALBUMIN 3.7   Urine analysis:    Component Value Date/Time   COLORURINE AMBER (A) 02/20/2024 2206   APPEARANCEUR HAZY (A) 02/20/2024 2206   LABSPEC 1.018 02/20/2024 2206   PHURINE 6.0 02/20/2024 2206   GLUCOSEU 50 (A) 02/20/2024 2206   HGBUR NEGATIVE 02/20/2024 2206   BILIRUBINUR NEGATIVE 02/20/2024 2206   KETONESUR NEGATIVE 02/20/2024 2206   PROTEINUR 100 (A) 02/20/2024 2206   UROBILINOGEN 1.0 07/24/2014 1605   NITRITE POSITIVE (A) 02/20/2024 2206   LEUKOCYTESUR NEGATIVE 02/20/2024 2206    Radiological Exams on Admission: DG Chest Port 1 View Result Date: 08/06/2024 EXAM: 1 VIEW XRAY OF THE CHEST 08/06/2024 01:48:00 PM COMPARISON: 02/20/2024 CLINICAL HISTORY: Pt comes by EMS for SOB and weakness. Pt has been unwell for the past 3-4 days. Pt has a productive cough that looks like pus. Pt wears 3L [place]. Pt has hx of copd, abd.hernia and diabetes. FINDINGS: LUNGS AND PLEURA: Diffuse interstitial prominence which corresponds to previously characterized chronic interstitial changes possibly related to smoking related respiratory bronchiolitis. No superimposed pleural effusion, air space consolidation or pneumothorax. HEART AND MEDIASTINUM: No acute abnormality of the cardiac and mediastinal silhouettes. BONES AND SOFT TISSUES: No acute osseous abnormality. IMPRESSION: 1. No acute process. 2. Diffuse interstitial prominence, possibly related to  smoking-related respiratory bronchiolitis. Electronically signed by: Waddell Calk MD 08/06/2024 02:32 PM EDT RP Workstation: [id number]   EKG: Independently reviewed.  Sinus tachycardia rate 114, QTc 474.  No significant change compared to prior EKG.  Assessment/Plan Principal Problem:   COPD with acute exacerbation (HCC) Active Problems:   Chronic pain syndrome   Hyponatremia   DM (diabetes mellitus) (HCC)   Cigarette  smoker   Essential hypertension   Chronic respiratory failure with hypoxia (HCC)  Assessment and Plan: * COPD with acute exacerbation (HCC) Presenting with dyspnea, productive cough, diffuse inspiratory and expiratory rhonchi on exam.  No increased O2 requirement.  VBG shows pH of 7.3, pCO2 of 77, suggesting chronic retention. Mental status is intact.  Chest x-ray without acute abnormality, suggest respiratory bronchiolitis.   - DuoNebs as needed and scheduled - Mucolytics - IV Solu-Medrol  125 mg given, continue 60 twice daily -Azithromycin  - Follow-up blood cultures  SIRS-SIRS criteria with tachycardia heart rate 103-115, leukocytosis of 13.8.  Continued respiratory symptoms, congestion, chest x-ray clear.  Tmax 99.9.  Lactic acid 1.7.  With influenza RSV negative, likely other viral etiology. - 1 L bolus given, continue N/s 100cc/hr x 15hrs - IV azithromycin  for now will hold off on further antibiotics - Follow-up blood cultures  Hyponatremia Sodium 128.  Baseline mid 130s. - Hydrate  DM (diabetes mellitus) (HCC) - Hgba1c - SSI- M - Resume home Lantus  10 units nightly - Hold Glipizide , Ozempic - Monitor glucose while on steroids  Cigarette smoker Smokes about half a pack of cigarettes daily.   - Nicotine  patch.  Chronic respiratory failure with hypoxia (HCC) Currently O2 sats 94 to 98% on home 3L.   Essential hypertension Stable.  Not on medication.  DVT prophylaxis: Lovenox  Code Status: FULL Family Communication: None at bedside Disposition  Plan: ~ 2 days Consults called: None Admission status: Obs Tele   Author: Tully FORBES Carwin, MD 08/06/2024 4:26 PM  For on call review [email or web address].

## 2024-08-06 NOTE — Assessment & Plan Note (Signed)
 Smokes about half a pack of cigarettes daily.   - Nicotine  patch.

## 2024-08-06 NOTE — Assessment & Plan Note (Signed)
-   Hgba1c - SSI- M - Resume home Lantus  10 units nightly - Hold Glipizide , Ozempic

## 2024-08-06 NOTE — ED Provider Notes (Signed)
 [place] EMERGENCY DEPARTMENT AT [facility] Provider Note   CSN: [id number] Arrival date & time: 08/06/24  1249     Patient presents with: Shortness of Breath and Weakness   [name] is a 58 y.o. female.  She has a history of COPD and is on home oxygen , continues to smoke.  Complaining of increased shortness of breath cough productive of pus along with pain in her chest and low-grade fever that has been going on for a few days getting progressively worse.  Started as a head cold and then she got a sore throat and lost her voice and now it is in her chest.  She has tried nothing for her symptoms.  Transported here by EMS.  EMS gave Tylenol  for fever   The history is provided by the patient and the EMS personnel.  Shortness of Breath Severity:  Moderate Onset quality:  Gradual Duration:  4 days Timing:  Constant Progression:  Worsening Chronicity:  Recurrent Relieved by:  Nothing Worsened by:  Activity and coughing Ineffective treatments:  None tried Associated symptoms: chest pain, fever, sore throat, sputum production and wheezing   Associated symptoms: no abdominal pain and no hemoptysis        Prior to Admission medications   Medication Sig Start Date End Date Taking? Authorizing Provider  acetaminophen  (TYLENOL ) 500 MG tablet Take 500 mg by mouth every 6 (six) hours as needed for fever or mild pain.    [provider]  albuterol  (PROVENTIL ) (2.5 MG/3ML) 0.083% nebulizer solution Take 3 mLs (2.5 mg total) by nebulization every 6 (six) hours as needed for wheezing or shortness of breath. 03/13/22   Pearlean Manus, MD  albuterol  (VENTOLIN  HFA) 108 (90 Base) MCG/ACT inhaler Inhale 2 puffs into the lungs every 4 (four) hours as needed for wheezing or shortness of breath. 03/13/22   Pearlean Manus, MD  glipiZIDE  (GLUCOTROL ) 5 MG tablet Take 2 tablets (10 mg total) by mouth daily before breakfast. 04/23/23   Tat, Alm, MD  glycerin  adult 2 g suppository  Place 1 suppository rectally as needed for constipation. 06/27/24   [name], MD  guaiFENesin -dextromethorphan  (ROBITUSSIN DM) 100-10 MG/5ML syrup Take 5 mLs by mouth every 4 (four) hours as needed for cough. 02/23/24   Pokhrel, Laxman, MD  LANTUS  SOLOSTAR 100 UNIT/ML Solostar Pen Inject 10 Units into the skin at bedtime. 01/25/24   [provider]  metFORMIN  (GLUCOPHAGE ) 500 MG tablet Take 1 tablet (500 mg total) by mouth 2 (two) times daily with a meal. Patient taking differently: Take 500 mg by mouth daily as needed (for BG greater than 200). 04/23/23   Evonnie Alm, MD  methocarbamol  (ROBAXIN ) 500 MG tablet Take 500 mg by mouth every 8 (eight) hours as needed for muscle spasms. 08/13/21   [provider]  nicotine  (NICODERM CQ  - DOSED IN MG/24 HOURS) 14 mg/24hr patch Place 1 patch (14 mg total) onto the skin daily as needed (nicotine  craving). 02/23/24   Pokhrel, Laxman, MD  ondansetron  (ZOFRAN -ODT) 4 MG disintegrating tablet 4mg  ODT q4 hours prn nausea/vomit Patient taking differently: Take 4 mg by mouth every 4 (four) hours as needed for nausea or vomiting. 4mg  ODT q4 hours prn nausea/vomit 04/19/22   Zammit, Joseph, MD  oxyCODONE -acetaminophen  (PERCOCET/ROXICET) 5-325 MG tablet Take 1 tablet by mouth every 6 (six) hours as needed for moderate pain (pain score 4-6) or severe pain (pain score 7-10). 02/23/24   Pokhrel, Laxman, MD  OZEMPIC, 0.25 OR 0.5 MG/DOSE,  2 MG/3ML SOPN Inject 1 mg into the skin every Wednesday. 12/28/23   [provider]  pantoprazole  (PROTONIX ) 40 MG tablet Take 1 tablet (40 mg total) by mouth daily. Patient taking differently: Take 40 mg by mouth daily as needed (for acid reflux). 03/13/22   Pearlean Manus, MD  polyethylene glycol (MIRALAX ) 17 g packet Take 17 g by mouth 2 (two) times daily. 06/27/24   [name], MD  rOPINIRole  (REQUIP ) 2 MG tablet Take 2 mg by mouth 3 (three) times daily. 03/09/23   [provider]  rosuvastatin   (CRESTOR ) 40 MG tablet Take 40 mg by mouth daily. 08/17/23   [provider]  SUMAtriptan  (IMITREX ) 50 MG tablet Take 50 mg by mouth as needed for migraine or headache. 11/29/21   [provider]  SYMBICORT  80-4.5 MCG/ACT inhaler Inhale 2 puffs into the lungs 2 (two) times daily. 12/28/23   [provider]  ARIPiprazole  (ABILIFY ) 5 MG tablet Take 5 mg by mouth daily.    02/03/12  [provider]  venlafaxine  (EFFEXOR ) 75 MG tablet Take 75 mg by mouth daily.    02/03/12  [provider]    Allergies: Levofloxacin , Penicillins, and Doxycycline     Review of Systems  Constitutional:  Positive for fever.  HENT:  Positive for sore throat.   Respiratory:  Positive for sputum production, shortness of breath and wheezing. Negative for hemoptysis.   Cardiovascular:  Positive for chest pain.  Gastrointestinal:  Negative for abdominal pain.    Updated Vital Signs BP (!) 152/59   Pulse (!) 115   Temp 99.9 F (37.7 C) (Oral)   Resp 18   Ht 5' (1.524 m)   Wt 66.2 kg   SpO2 94%   BMI 28.51 kg/m   Physical Exam Vitals and nursing note reviewed.  Constitutional:      Appearance: Normal appearance. She is well-developed.  HENT:     Head: Normocephalic and atraumatic.  Eyes:     Conjunctiva/sclera: Conjunctivae normal.  Cardiovascular:     Rate and Rhythm: Normal rate and regular rhythm.     Heart sounds: No murmur heard. Pulmonary:     Effort: Tachypnea and accessory muscle usage present. No respiratory distress.     Breath sounds: No stridor. Wheezing and rhonchi present.  Abdominal:     Palpations: Abdomen is soft.     Tenderness: There is no abdominal tenderness. There is no guarding or rebound.     Comments: ventral hernia when coughing  Musculoskeletal:        General: No tenderness or deformity. Normal range of motion.     Cervical back: Neck supple.     Right lower leg: No tenderness. No edema.     Left lower leg: No tenderness. No edema.   Skin:    General: Skin is warm and dry.  Neurological:     General: No focal deficit present.     Mental Status: She is alert.     GCS: GCS eye subscore is 4. GCS verbal subscore is 5. GCS motor subscore is 6.     (all labs ordered are listed, but only abnormal results are displayed) Labs Reviewed  CBC WITH DIFFERENTIAL/PLATELET - Abnormal; Notable for the following components:      Result Value   WBC 13.8 (*)    Neutro Abs 11.4 (*)    All other components within normal limits  COMPREHENSIVE METABOLIC PANEL WITH GFR - Abnormal; Notable for the following components:  Sodium 128 (*)    Chloride 88 (*)    Glucose, Bld 227 (*)    Creatinine, Ser 0.33 (*)    Calcium  8.1 (*)    All other components within normal limits  BLOOD GAS, VENOUS - Abnormal; Notable for the following components:   pCO2, Ven 77 (*)    pO2, Ven 48 (*)    Bicarbonate 39.5 (*)    Acid-Base Excess 10.4 (*)    All other components within normal limits  GLUCOSE, CAPILLARY - Abnormal; Notable for the following components:   Glucose-Capillary 238 (*)    All other components within normal limits  RESP PANEL BY RT-PCR (RSV, FLU A&B, COVID)  RVPGX2  CULTURE, BLOOD (ROUTINE X 2)  CULTURE, BLOOD (ROUTINE X 2)  RESPIRATORY PANEL BY PCR  LACTIC ACID, PLASMA  LACTIC ACID, PLASMA  BRAIN NATRIURETIC PEPTIDE  HEMOGLOBIN A1C  HIV ANTIBODY (ROUTINE TESTING W REFLEX)  BASIC METABOLIC PANEL WITH GFR  CBC  TROPONIN I (HIGH SENSITIVITY)  TROPONIN I (HIGH SENSITIVITY)    EKG: EKG Interpretation Date/Time:  Sunday August 06 2024 13:04:23 EDT Ventricular Rate:  114 PR Interval:  170 QRS Duration:  124 QT Interval:  344 QTC Calculation: 474 R Axis:   86  Text Interpretation: Sinus tachycardia Right atrial enlargement Right bundle branch block Probable inferior infarct, recent Lateral leads are also involved No significant change since prior 3/25 Confirmed by Towana Sharper [phone]) on 08/06/2024 1:10:54  PM  Radiology: [facility] Chest Port 1 View Result Date: 08/06/2024 EXAM: 1 VIEW XRAY OF THE CHEST 08/06/2024 01:48:00 PM COMPARISON: 02/20/2024 CLINICAL HISTORY: Pt comes by EMS for SOB and weakness. Pt has been unwell for the past 3-4 days. Pt has a productive cough that looks like pus. Pt wears 3L [place]. Pt has hx of copd, abd.hernia and diabetes. FINDINGS: LUNGS AND PLEURA: Diffuse interstitial prominence which corresponds to previously characterized chronic interstitial changes possibly related to smoking related respiratory bronchiolitis. No superimposed pleural effusion, air space consolidation or pneumothorax. HEART AND MEDIASTINUM: No acute abnormality of the cardiac and mediastinal silhouettes. BONES AND SOFT TISSUES: No acute osseous abnormality. IMPRESSION: 1. No acute process. 2. Diffuse interstitial prominence, possibly related to smoking-related respiratory bronchiolitis. Electronically signed by: Waddell Calk MD 08/06/2024 02:32 PM EDT RP Workstation: [id number]     Procedures   Medications Ordered in the ED  nicotine  (NICODERM CQ  - dosed in mg/24 hours) patch 14 mg (has no administration in time range)  insulin  glargine-yfgn (SEMGLEE ) injection 10 Units (has no administration in time range)  rOPINIRole  (REQUIP ) tablet 2 mg (2 mg Oral Given 08/06/24 1728)  guaiFENesin -dextromethorphan  (ROBITUSSIN DM) 100-10 MG/5ML syrup 15 mL (15 mLs Oral Given 08/06/24 1727)  fluticasone  furoate-vilanterol (BREO ELLIPTA ) 100-25 MCG/ACT 1 puff (1 puff Inhalation Not Given 08/06/24 1706)  ipratropium-albuterol  (DUONEB) 0.5-2.5 (3) MG/3ML nebulizer solution 3 mL (has no administration in time range)  ipratropium-albuterol  (DUONEB) 0.5-2.5 (3) MG/3ML nebulizer solution 3 mL (3 mLs Nebulization Not Given 08/06/24 1705)  insulin  aspart (novoLOG ) injection 0-5 Units (has no administration in time range)  enoxaparin  (LOVENOX ) injection 40 mg (has no administration in time range)  azithromycin  (ZITHROMAX ) 500 mg in  sodium chloride  0.9 % 250 mL IVPB (500 mg Intravenous New Bag/Given 08/06/24 1740)    Followed by  azithromycin  (ZITHROMAX ) tablet 500 mg (has no administration in time range)  methylPREDNISolone  sodium succinate  (SOLU-MEDROL ) 125 mg/2 mL injection 60 mg (has no administration in time range)    Followed by  predniSONE  (DELTASONE ) tablet  40 mg (has no administration in time range)  0.9 %  sodium chloride  infusion ( Intravenous New Bag/Given 08/06/24 1734)  acetaminophen  (TYLENOL ) tablet 650 mg (has no administration in time range)    Or  acetaminophen  (TYLENOL ) suppository 650 mg (has no administration in time range)  ondansetron  (ZOFRAN ) tablet 4 mg (has no administration in time range)    Or  ondansetron  (ZOFRAN ) injection 4 mg (has no administration in time range)  polyethylene glycol (MIRALAX  / GLYCOLAX ) packet 17 g (has no administration in time range)  traMADol  (ULTRAM ) tablet 50 mg (has no administration in time range)  influenza vac split trivalent PF (FLUZONE ) injection 0.5 mL (has no administration in time range)  insulin  aspart (novoLOG ) injection 0-15 Units (5 Units Subcutaneous Given 08/06/24 1741)  methylPREDNISolone  sodium succinate  (SOLU-MEDROL ) 125 mg/2 mL injection 125 mg (125 mg Intravenous Given 08/06/24 1401)  ipratropium-albuterol  (DUONEB) 0.5-2.5 (3) MG/3ML nebulizer solution 3 mL (3 mLs Nebulization Given 08/06/24 1358)  magnesium  sulfate IVPB 2 g 50 mL (2 g Intravenous New Bag/Given 08/06/24 1400)  morphine  (PF) 4 MG/ML injection 4 mg (4 mg Intravenous Given 08/06/24 1401)  sodium chloride  0.9 % bolus 1,000 mL (1,000 mLs Intravenous New Bag/Given 08/06/24 1400)  ondansetron  (ZOFRAN ) injection 4 mg (4 mg Intravenous Given 08/06/24 1401)    Clinical Course as of 08/06/24 1750  Sun Aug 06, 2024  1527 Discussed with [facility] hospitalist Dr. Pearlean I who will evaluate patient for admission. [MB]    Clinical Course User Index [MB] Towana Ozell BROCKS, MD                                  Medical Decision Making Amount and/or Complexity of Data Reviewed Labs: ordered. Radiology: ordered.  Risk Prescription drug management. Decision regarding hospitalization.   This patient complains of productive cough shortness of breath chest pain; this involves an extensive number of treatment Options and is a complaint that carries with it a high risk of complications and morbidity. The differential includes COPD, CHF, pneumonia, sepsis, PE, pneumothorax  I ordered, reviewed and interpreted labs, which included CBC with elevated white count, chemistries elevated glucose low sodium, VBG with CO2 elevation although normal pH likely chronically compensated, blood culture sent, lactate normal troponin negative BNP negative I ordered medication IV steroids magnesium  breathing treatments nausea medication pain medication and reviewed PMP when indicated. I ordered imaging studies which included chest x-ray and I independently    visualized and interpreted imaging which showed chronic bronchitic findings Previous records obtained and reviewed in epic including recent PCP and ED notes I consulted [facility] hospitalist Dr. Pearlean and discussed lab and imaging findings and discussed disposition.  Cardiac monitoring reviewed, sinus tachycardia Social determinants considered, tobacco use social isolation Critical Interventions: None  After the interventions stated above, I reevaluated the patient and found patient to be looking a little more comfortable and satting well on nasal cannula Admission and further testing considered, she would benefit from mission for further management.  She is in agreement with plan for admission.      Final diagnoses:  COPD exacerbation [facility])    ED Discharge Orders     None          Towana Ozell BROCKS, MD 08/06/24 1753

## 2024-08-06 NOTE — ED Triage Notes (Signed)
 Pt comes by EMS for SOB and weakness. Pt has been unwell for the past 3-4 days. Pt has a productive cough that looks like pus. Pt wears 3L [place]. Pt has hx of copd, abd.hernia and diabetes.   EMS VS 140/70 115 HR RBBB  46 End [place] CO2 99 temp - EMS gave 1000 mg tylenol 

## 2024-08-06 NOTE — Assessment & Plan Note (Signed)
 Currently O2 sats 94 to 98% on home 3L.

## 2024-08-06 NOTE — Plan of Care (Signed)
   Problem: Education: Goal: Knowledge of General Education information will improve Description Including pain rating scale, medication(s)/side effects and non-pharmacologic comfort measures Outcome: Progressing

## 2024-08-06 NOTE — Assessment & Plan Note (Signed)
 Presenting with dyspnea, productive cough, diffuse inspiratory and expiratory rhonchi on exam.  No increased O2 requirement.  VBG shows pH of 7.3, pCO2 of 77, suggesting chronic retention. Mental status is intact.  Chest x-ray without acute abnormality, suggest respiratory bronchiolitis.   - DuoNebs as needed and scheduled - Mucolytics - IV Solu-Medrol  125 mg given, continue 60 twice daily -Azithromycin  - Follow-up blood cultures

## 2024-08-06 NOTE — Assessment & Plan Note (Signed)
 Sodium 128.  Baseline mid 130s. - Hydrate

## 2024-08-06 NOTE — Assessment & Plan Note (Signed)
Stable.  Not on medication. 

## 2024-08-07 ENCOUNTER — Ambulatory Visit: Admitting: Pain Medicine

## 2024-08-07 DIAGNOSIS — J9612 Chronic respiratory failure with hypercapnia: Secondary | ICD-10-CM | POA: Diagnosis present

## 2024-08-07 DIAGNOSIS — Z23 Encounter for immunization: Secondary | ICD-10-CM | POA: Diagnosis not present

## 2024-08-07 DIAGNOSIS — J189 Pneumonia, unspecified organism: Secondary | ICD-10-CM | POA: Diagnosis present

## 2024-08-07 DIAGNOSIS — E1165 Type 2 diabetes mellitus with hyperglycemia: Secondary | ICD-10-CM | POA: Diagnosis present

## 2024-08-07 DIAGNOSIS — J441 Chronic obstructive pulmonary disease with (acute) exacerbation: Secondary | ICD-10-CM | POA: Diagnosis present

## 2024-08-07 DIAGNOSIS — Z7951 Long term (current) use of inhaled steroids: Secondary | ICD-10-CM | POA: Diagnosis not present

## 2024-08-07 DIAGNOSIS — K219 Gastro-esophageal reflux disease without esophagitis: Secondary | ICD-10-CM | POA: Diagnosis present

## 2024-08-07 DIAGNOSIS — Z833 Family history of diabetes mellitus: Secondary | ICD-10-CM | POA: Diagnosis not present

## 2024-08-07 DIAGNOSIS — J9611 Chronic respiratory failure with hypoxia: Secondary | ICD-10-CM | POA: Diagnosis present

## 2024-08-07 DIAGNOSIS — R6510 Systemic inflammatory response syndrome (SIRS) of non-infectious origin without acute organ dysfunction: Secondary | ICD-10-CM | POA: Diagnosis present

## 2024-08-07 DIAGNOSIS — Z9981 Dependence on supplemental oxygen: Secondary | ICD-10-CM | POA: Diagnosis not present

## 2024-08-07 DIAGNOSIS — Z7984 Long term (current) use of oral hypoglycemic drugs: Secondary | ICD-10-CM | POA: Diagnosis not present

## 2024-08-07 DIAGNOSIS — E871 Hypo-osmolality and hyponatremia: Secondary | ICD-10-CM | POA: Diagnosis present

## 2024-08-07 DIAGNOSIS — Z794 Long term (current) use of insulin: Secondary | ICD-10-CM | POA: Diagnosis not present

## 2024-08-07 DIAGNOSIS — J440 Chronic obstructive pulmonary disease with acute lower respiratory infection: Secondary | ICD-10-CM | POA: Diagnosis present

## 2024-08-07 DIAGNOSIS — G43909 Migraine, unspecified, not intractable, without status migrainosus: Secondary | ICD-10-CM | POA: Diagnosis present

## 2024-08-07 DIAGNOSIS — Z7985 Long-term (current) use of injectable non-insulin antidiabetic drugs: Secondary | ICD-10-CM | POA: Diagnosis not present

## 2024-08-07 DIAGNOSIS — I10 Essential (primary) hypertension: Secondary | ICD-10-CM | POA: Diagnosis present

## 2024-08-07 DIAGNOSIS — B9789 Other viral agents as the cause of diseases classified elsewhere: Secondary | ICD-10-CM | POA: Diagnosis present

## 2024-08-07 DIAGNOSIS — Z720 Tobacco use: Secondary | ICD-10-CM | POA: Diagnosis not present

## 2024-08-07 DIAGNOSIS — G894 Chronic pain syndrome: Secondary | ICD-10-CM | POA: Diagnosis present

## 2024-08-07 DIAGNOSIS — Z8249 Family history of ischemic heart disease and other diseases of the circulatory system: Secondary | ICD-10-CM | POA: Diagnosis not present

## 2024-08-07 DIAGNOSIS — B9710 Unspecified enterovirus as the cause of diseases classified elsewhere: Secondary | ICD-10-CM | POA: Diagnosis present

## 2024-08-07 DIAGNOSIS — F17210 Nicotine dependence, cigarettes, uncomplicated: Secondary | ICD-10-CM | POA: Diagnosis present

## 2024-08-07 DIAGNOSIS — Z1152 Encounter for screening for COVID-19: Secondary | ICD-10-CM | POA: Diagnosis not present

## 2024-08-07 LAB — GLUCOSE, CAPILLARY
Glucose-Capillary: 226 mg/dL — ABNORMAL HIGH (ref 70–99)
Glucose-Capillary: 155 mg/dL — ABNORMAL HIGH (ref 70–99)
Glucose-Capillary: 226 mg/dL — ABNORMAL HIGH (ref 70–99)
Glucose-Capillary: 257 mg/dL — ABNORMAL HIGH (ref 70–99)

## 2024-08-07 LAB — RESPIRATORY PANEL BY PCR

## 2024-08-07 LAB — CBC
HCT: 40.5 % (ref 36.0–46.0)
Hemoglobin: 12.2 g/dL (ref 12.0–15.0)
MCH: 27.2 pg (ref 26.0–34.0)
MCHC: 30.1 g/dL (ref 30.0–36.0)
MCV: 90.4 fL (ref 80.0–100.0)
Platelets: 326 K/uL (ref 150–400)
RBC: 4.48 MIL/uL (ref 3.87–5.11)
RDW: 13.2 % (ref 11.5–15.5)
WBC: 9.4 K/uL (ref 4.0–10.5)
nRBC: 0 % (ref 0.0–0.2)

## 2024-08-07 LAB — BASIC METABOLIC PANEL WITH GFR
Anion gap: 9 (ref 5–15)
BUN: 8 mg/dL (ref 6–20)
CO2: 34 mmol/L — ABNORMAL HIGH (ref 22–32)
Calcium: 8.5 mg/dL — ABNORMAL LOW (ref 8.9–10.3)
Chloride: 95 mmol/L — ABNORMAL LOW (ref 98–111)
Creatinine, Ser: 0.4 mg/dL — ABNORMAL LOW (ref 0.44–1.00)
GFR, Estimated: >60 mL/min (ref >60)
Glucose, Bld: 243 mg/dL — ABNORMAL HIGH (ref 70–99)
Potassium: 4.6 mmol/L (ref 3.5–5.1)
Sodium: 138 mmol/L (ref 135–145)

## 2024-08-07 LAB — HIV ANTIBODY (ROUTINE TESTING W REFLEX): HIV Screen 4th Generation wRfx: NONREACTIVE

## 2024-08-07 MED ORDER — TRAMADOL HCL 50 MG PO TABS
50.0000 mg | ORAL_TABLET | Freq: Three times a day (TID) | ORAL | Status: DC | PRN
  Administered 2024-08-07 (×2): 50 mg via ORAL
  Filled 2024-08-07 (×2): qty 1

## 2024-08-07 MED ORDER — MORPHINE SULFATE (PF) 2 MG/ML IV SOLN
1.0000 mg | INTRAVENOUS | Status: DC | PRN
  Administered 2024-08-07 – 2024-08-09 (×11): 1 mg via INTRAVENOUS
  Filled 2024-08-07 (×12): qty 1

## 2024-08-07 MED ORDER — TRAZODONE HCL 50 MG PO TABS
50.0000 mg | ORAL_TABLET | Freq: Every evening | ORAL | Status: DC | PRN
  Administered 2024-08-07 – 2024-08-11 (×4): 50 mg via ORAL
  Filled 2024-08-07 (×4): qty 1

## 2024-08-07 MED ORDER — SODIUM CHLORIDE 0.9 % IV SOLN: INTRAVENOUS | Status: AC

## 2024-08-07 MED ORDER — INSULIN GLARGINE 100 UNIT/ML ~~LOC~~ SOLN
30.0000 [IU] | Freq: Every day | SUBCUTANEOUS | Status: DC
Start: 2024-08-07 — End: 2024-08-08
  Administered 2024-08-07: 30 [IU] via SUBCUTANEOUS
  Filled 2024-08-07 (×3): qty 0.3

## 2024-08-07 MED ORDER — FLUTICASONE PROPIONATE 50 MCG/ACT NA SUSP
2.0000 | Freq: Every day | NASAL | Status: DC | PRN
  Administered 2024-08-07: 2 via NASAL
  Filled 2024-08-07: qty 16

## 2024-08-07 MED ORDER — PANTOPRAZOLE SODIUM 40 MG PO TBEC
40.0000 mg | DELAYED_RELEASE_TABLET | Freq: Every day | ORAL | Status: DC
  Administered 2024-08-07 – 2024-08-11 (×5): 40 mg via ORAL
  Filled 2024-08-07 (×5): qty 1

## 2024-08-07 MED ORDER — INSULIN ASPART 100 UNIT/ML IJ SOLN
10.0000 [IU] | Freq: Three times a day (TID) | INTRAMUSCULAR | Status: DC
  Administered 2024-08-07 (×3): 10 [IU] via SUBCUTANEOUS

## 2024-08-07 NOTE — Plan of Care (Signed)
 Problem: Education: Goal: Knowledge of General Education information will improve Description: Including pain rating scale, medication(s)/side effects and non-pharmacologic comfort measures 08/07/2024 0133 by Olene Corean CROME, RN Outcome: Progressing 08/07/2024 0132 by Olene Corean CROME, RN Outcome: Progressing   Problem: Health Behavior/Discharge Planning: Goal: Ability to manage health-related needs will improve 08/07/2024 0133 by Olene Corean CROME, RN Outcome: Progressing 08/07/2024 0132 by Olene Corean CROME, RN Outcome: Progressing   Problem: Clinical Measurements: Goal: Ability to maintain clinical measurements within normal limits will improve 08/07/2024 0133 by Olene Corean CROME, RN Outcome: Progressing 08/07/2024 0132 by Olene Corean CROME, RN Outcome: Progressing Goal: Will remain free from infection 08/07/2024 0133 by Olene Corean CROME, RN Outcome: Progressing 08/07/2024 0132 by Olene Corean CROME, RN Outcome: Progressing Goal: Diagnostic test results will improve 08/07/2024 0133 by Olene Corean CROME, RN Outcome: Progressing 08/07/2024 0132 by Olene Corean CROME, RN Outcome: Progressing Goal: Respiratory complications will improve 08/07/2024 0133 by Olene Corean CROME, RN Outcome: Progressing 08/07/2024 0132 by Olene Corean CROME, RN Outcome: Progressing Goal: Cardiovascular complication will be avoided 08/07/2024 0133 by Olene Corean CROME, RN Outcome: Progressing 08/07/2024 0132 by Olene Corean CROME, RN Outcome: Progressing   Problem: Activity: Goal: Risk for activity intolerance will decrease 08/07/2024 0133 by Olene Corean CROME, RN Outcome: Progressing 08/07/2024 0132 by Olene Corean CROME, RN Outcome: Progressing   Problem: Nutrition: Goal: Adequate nutrition will be maintained 08/07/2024 0133 by Olene Corean CROME, RN Outcome: Progressing 08/07/2024 0132 by Olene Corean CROME,  RN Outcome: Progressing   Problem: Coping: Goal: Level of anxiety will decrease 08/07/2024 0133 by Olene Corean CROME, RN Outcome: Progressing 08/07/2024 0132 by Olene Corean CROME, RN Outcome: Progressing   Problem: Elimination: Goal: Will not experience complications related to bowel motility 08/07/2024 0133 by Olene Corean CROME, RN Outcome: Progressing 08/07/2024 0132 by Olene Corean CROME, RN Outcome: Progressing Goal: Will not experience complications related to urinary retention 08/07/2024 0133 by Olene Corean CROME, RN Outcome: Progressing 08/07/2024 0132 by Olene Corean CROME, RN Outcome: Progressing   Problem: Pain Managment: Goal: General experience of comfort will improve and/or be controlled 08/07/2024 0133 by Olene Corean CROME, RN Outcome: Progressing 08/07/2024 0132 by Olene Corean CROME, RN Outcome: Progressing   Problem: Safety: Goal: Ability to remain free from injury will improve 08/07/2024 0133 by Olene Corean CROME, RN Outcome: Progressing 08/07/2024 0132 by Olene Corean CROME, RN Outcome: Progressing   Problem: Skin Integrity: Goal: Risk for impaired skin integrity will decrease 08/07/2024 0133 by Olene Corean CROME, RN Outcome: Progressing 08/07/2024 0132 by Olene Corean CROME, RN Outcome: Progressing   Problem: Education: Goal: Knowledge of disease or condition will improve 08/07/2024 0133 by Olene Corean CROME, RN Outcome: Progressing 08/07/2024 0132 by Olene Corean CROME, RN Outcome: Progressing Goal: Knowledge of the prescribed therapeutic regimen will improve 08/07/2024 0133 by Olene Corean CROME, RN Outcome: Progressing 08/07/2024 0132 by Olene Corean CROME, RN Outcome: Progressing Goal: Individualized Educational Video(s) 08/07/2024 0133 by Olene Corean CROME, RN Outcome: Progressing 08/07/2024 0132 by Olene Corean CROME, RN Outcome: Progressing   Problem: Activity: Goal:  Ability to tolerate increased activity will improve 08/07/2024 0133 by Olene Corean CROME, RN Outcome: Progressing 08/07/2024 0132 by Olene Corean CROME, RN Outcome: Progressing Goal: Will verbalize the importance of balancing activity with adequate rest periods 08/07/2024 0133 by Olene Corean CROME, RN Outcome: Progressing 08/07/2024 0132 by Olene Corean CROME, RN Outcome: Progressing   Problem: Respiratory: Goal: Ability to maintain a clear airway will improve 08/07/2024 0133 by Olene Corean CROME, RN Outcome:  Progressing 08/07/2024 0132 by Olene Corean CROME, RN Outcome: Progressing Goal: Levels of oxygenation will improve 08/07/2024 0133 by Olene Corean CROME, RN Outcome: Progressing 08/07/2024 0132 by Olene Corean CROME, RN Outcome: Progressing Goal: Ability to maintain adequate ventilation will improve 08/07/2024 0133 by Olene Corean CROME, RN Outcome: Progressing 08/07/2024 0132 by Olene Corean CROME, RN Outcome: Progressing   Problem: Education: Goal: Ability to describe self-care measures that may prevent or decrease complications (Diabetes Survival Skills Education) will improve 08/07/2024 0133 by Olene Corean CROME, RN Outcome: Progressing 08/07/2024 0132 by Olene Corean CROME, RN Outcome: Progressing Goal: Individualized Educational Video(s) 08/07/2024 0133 by Olene Corean CROME, RN Outcome: Progressing 08/07/2024 0132 by Olene Corean CROME, RN Outcome: Progressing   Problem: Coping: Goal: Ability to adjust to condition or change in health will improve 08/07/2024 0133 by Olene Corean CROME, RN Outcome: Progressing 08/07/2024 0132 by Olene Corean CROME, RN Outcome: Progressing   Problem: Fluid Volume: Goal: Ability to maintain a balanced intake and output will improve 08/07/2024 0133 by Olene Corean CROME, RN Outcome: Progressing 08/07/2024 0132 by Olene Corean CROME, RN Outcome: Progressing    Problem: Health Behavior/Discharge Planning: Goal: Ability to identify and utilize available resources and services will improve 08/07/2024 0133 by Olene Corean CROME, RN Outcome: Progressing 08/07/2024 0132 by Olene Corean CROME, RN Outcome: Progressing Goal: Ability to manage health-related needs will improve 08/07/2024 0133 by Olene Corean CROME, RN Outcome: Progressing 08/07/2024 0132 by Olene Corean CROME, RN Outcome: Progressing   Problem: Metabolic: Goal: Ability to maintain appropriate glucose levels will improve 08/07/2024 0133 by Olene Corean CROME, RN Outcome: Progressing 08/07/2024 0132 by Olene Corean CROME, RN Outcome: Progressing   Problem: Nutritional: Goal: Maintenance of adequate nutrition will improve 08/07/2024 0133 by Olene Corean CROME, RN Outcome: Progressing 08/07/2024 0132 by Olene Corean CROME, RN Outcome: Progressing Goal: Progress toward achieving an optimal weight will improve 08/07/2024 0133 by Olene Corean CROME, RN Outcome: Progressing 08/07/2024 0132 by Olene Corean CROME, RN Outcome: Progressing   Problem: Skin Integrity: Goal: Risk for impaired skin integrity will decrease 08/07/2024 0133 by Olene Corean CROME, RN Outcome: Progressing 08/07/2024 0132 by Olene Corean CROME, RN Outcome: Progressing   Problem: Tissue Perfusion: Goal: Adequacy of tissue perfusion will improve 08/07/2024 0133 by Olene Corean CROME, RN Outcome: Progressing 08/07/2024 0132 by Olene Corean CROME, RN Outcome: Progressing

## 2024-08-07 NOTE — Progress Notes (Signed)
   08/07/24 1546  TOC Brief Assessment  Insurance and Status Reviewed  Patient has primary care physician Yes  Home environment has been reviewed Home with family  Prior level of function: Needs assistances  Prior/Current Home Services Current home services ([employer] for home oxygen )  Social Drivers of Health Review SDOH reviewed no interventions necessary  Readmission risk has been reviewed Yes  Transition of care needs no transition of care needs at this time   Transition of Care Department [facility]) has reviewed patient and no TOC needs have been identified at this time. We will continue to monitor patient advancement through interdisciplinary progression rounds. If new patient transition needs arise, please place a TOC consult.

## 2024-08-07 NOTE — Progress Notes (Signed)
 PROGRESS NOTE   [name]  FMW:[id number] DOB: May 29, 1966 DOA: 08/06/2024 PCP: Vick Lurie, FNP   Chief Complaint  Patient presents with   Shortness of Breath   Weakness   Level of care: Telemetry  Brief Admission History:  58 y.o. female with medical history significant for COPD, chronic respiratory failure on 3 L, diabetes mellitus, hypertension, chronic pain.  Patient presented to the ED with complaints of difficulty breathing generalized weakness and productive cough-per patient purulent appearing started 3 to 4 days ago.  She also reports sore throat, nasal congestion, and that she lost her voice due to severity of her symptoms.  Reports subjective chills, she did not check her temperature.  She reports nausea but no vomiting no diarrhea.  Chest x-ray negative for acute abnormality, suggest respiratory bronchiolitis.  DuoNebs, magnesium  2 g, Solu-Medrol  125 mg, 1 L bolus given.  Hospitalist to admit for COPD exacerbation.   Assessment and Plan:  COPD with acute exacerbation Presenting with dyspnea, productive cough, diffuse inspiratory and expiratory rhonchi on exam.  No increased O2 requirement.  VBG shows pH of 7.3, pCO2 of 77, suggesting chronic retention. Mental status is intact.  Chest x-ray without acute abnormality, suggest respiratory bronchiolitis.   - DuoNebs as needed and scheduled - Mucolytics - IV Solu-Medrol  125 mg given, continue 60 twice daily - Azithromycin  stopped by patient due to intolerance - Follow-up blood cultures - Acute Viral Testing positive for rhinovirus/enterovirus - DC antibiotics as patient could not tolerate them anyway and continue supportive care   DM (diabetes mellitus) - Hgba1c - SSI- M - Resume home Lantus  10 units nightly - Hold Glipizide , Ozempic CBG (last 3)  Recent Labs    08/06/24 2209 08/07/24 0722 08/07/24 1123  GLUCAP 258* 226* 155*   Cigarette smoker Smokes about half a pack of cigarettes daily.   - Nicotine   patch.  Hyponatremia - resolved  Chronic respiratory failure with hypoxia  Currently O2 sats 94 to 98% on home 3L.   Essential hypertension Stable.  Not on medication.  DVT prophylaxis: enoxaparin  Code Status: Full Family Communication:  Disposition: home    Consultants:   Procedures:   Antimicrobials:    Subjective: Pt says she could not tolerate the IV antibiotic and it was stopped.  She is having a lot of symptoms mostly chest congestion, cough, wheezing and malaise.   Objective: Vitals:   08/07/24 0715 08/07/24 0916 08/07/24 1353 08/07/24 1422  BP:  (!) 133/99 122/73   Pulse:  99 (!) 102   Resp:   20   Temp:   99 F (37.2 C)   TempSrc:   Oral   SpO2: 96%  91% 92%  Weight:      Height:        Intake/Output Summary (Last 24 hours) at 08/07/2024 1618 Last data filed at 08/07/2024 0300 Gross per 24 hour  Intake 1360.99 ml  Output --  Net 1360.99 ml   Filed Weights   08/06/24 1304 08/06/24 1654  Weight: 66.2 kg 66.3 kg   Examination:  General exam: Appears calm and comfortable  Respiratory system: Clear to auscultation. Respiratory effort normal. Cardiovascular system: normal S1 & S2 heard. No JVD, murmurs, rubs, gallops or clicks. No pedal edema. Gastrointestinal system: Abdomen is nondistended, soft and nontender. No organomegaly or masses felt. Normal bowel sounds heard. Central nervous system: Alert and oriented. No focal neurological deficits. Extremities: Symmetric 5 x 5 power. Skin: No rashes, lesions or ulcers. Psychiatry: Judgement and insight appear normal.  Mood & affect appropriate.   Data Reviewed: I have personally reviewed following labs and imaging studies  CBC: Recent Labs  Lab 08/06/24 1350 08/07/24 0418  WBC 13.8* 9.4  NEUTROABS 11.4*  --   HGB 12.8 12.2  HCT 41.4 40.5  MCV 87.3 90.4  PLT 303 326    Basic Metabolic Panel: Recent Labs  Lab 08/06/24 1350 08/07/24 0418  NA 128* 138  K 3.6 4.6  CL 88* 95*  CO2 30 34*   GLUCOSE 227* 243*  BUN 8 8  CREATININE 0.33* 0.40*  CALCIUM  8.1* 8.5*    CBG: Recent Labs  Lab 08/06/24 1651 08/06/24 1818 08/06/24 2209 08/07/24 0722 08/07/24 1123  GLUCAP 238* 388* 258* 226* 155*    Recent Results (from the past 240 hours)  Resp panel by RT-PCR (RSV, Flu A&B, Covid) Anterior Nasal Swab     Status: None   Collection Time: 08/06/24  1:09 PM   Specimen: Anterior Nasal Swab  Result Value Ref Range Status   SARS Coronavirus 2 by RT PCR NEGATIVE NEGATIVE Final    Comment: (NOTE) SARS-CoV-2 target nucleic acids are NOT DETECTED.  The SARS-CoV-2 RNA is generally detectable in upper respiratory specimens during the acute phase of infection. The lowest concentration of SARS-CoV-2 viral copies this assay can detect is 138 copies/mL. A negative result does not preclude SARS-Cov-2 infection and should not be used as the sole basis for treatment or other patient management decisions. A negative result may occur with  improper specimen collection/handling, submission of specimen other than nasopharyngeal swab, presence of viral mutation(s) within the areas targeted by this assay, and inadequate number of viral copies(<138 copies/mL). A negative result must be combined with clinical observations, patient history, and epidemiological information. The expected result is Negative.  Fact Sheet for Patients:  BloggerCourse.com  Fact Sheet for Healthcare Providers:  SeriousBroker.it  This test is no t yet approved or cleared by the [place]  FDA and  has been authorized for detection and/or diagnosis of SARS-CoV-2 by FDA under an Emergency Use Authorization (EUA). This EUA will remain  in effect (meaning this test can be used) for the duration of the COVID-19 declaration under Section 564(b)(1) of the Act, 21 U.S.C.section 360bbb-3(b)(1), unless the authorization is terminated  or revoked sooner.        Influenza A by PCR NEGATIVE NEGATIVE Final   Influenza B by PCR NEGATIVE NEGATIVE Final    Comment: (NOTE) The Xpert Xpress SARS-CoV-2/FLU/RSV plus assay is intended as an aid in the diagnosis of influenza from Nasopharyngeal swab specimens and should not be used as a sole basis for treatment. Nasal washings and aspirates are unacceptable for Xpert Xpress SARS-CoV-2/FLU/RSV testing.  Fact Sheet for Patients: BloggerCourse.com  Fact Sheet for Healthcare Providers: SeriousBroker.it  This test is not yet approved or cleared by the [place]  FDA and has been authorized for detection and/or diagnosis of SARS-CoV-2 by FDA under an Emergency Use Authorization (EUA). This EUA will remain in effect (meaning this test can be used) for the duration of the COVID-19 declaration under Section 564(b)(1) of the Act, 21 U.S.C. section 360bbb-3(b)(1), unless the authorization is terminated or revoked.     Resp Syncytial Virus by PCR NEGATIVE NEGATIVE Final    Comment: (NOTE) Fact Sheet for Patients: BloggerCourse.com  Fact Sheet for Healthcare Providers: SeriousBroker.it  This test is not yet approved or cleared by the [place]  FDA and has been authorized for detection and/or diagnosis of SARS-CoV-2 by FDA under an  Emergency Use Authorization (EUA). This EUA will remain in effect (meaning this test can be used) for the duration of the COVID-19 declaration under Section 564(b)(1) of the Act, 21 U.S.C. section 360bbb-3(b)(1), unless the authorization is terminated or revoked.  Performed at [facility], [address], [place], [place] [address]   Culture, blood (routine x 2)     Status: None (Preliminary result)   Collection Time: 08/06/24  1:50 PM   Specimen: BLOOD  Result Value Ref Range Status   Specimen Description BLOOD LEFT ANTECUBITAL  Final   Special Requests   Final     BOTTLES DRAWN AEROBIC AND ANAEROBIC Blood Culture adequate volume   Culture   Final    NO GROWTH < 24 HOURS Performed at [facility], [address] [place], [place] [address]    Report Status PENDING  Incomplete  Culture, blood (routine x 2)     Status: None (Preliminary result)   Collection Time: 08/06/24  1:50 PM   Specimen: BLOOD  Result Value Ref Range Status   Specimen Description BLOOD RIGHT ANTECUBITAL  Final   Special Requests   Final    BOTTLES DRAWN AEROBIC AND ANAEROBIC Blood Culture adequate volume   Culture   Final    NO GROWTH < 24 HOURS Performed at [facility], [address] [place], [place] [address]    Report Status PENDING  Incomplete  Respiratory (~20 pathogens) panel by PCR     Status: Abnormal   Collection Time: 08/06/24  4:26 PM   Specimen: Nasopharyngeal Swab; Respiratory  Result Value Ref Range Status   Adenovirus NOT DETECTED NOT DETECTED Final   Coronavirus 229E NOT DETECTED NOT DETECTED Final    Comment: (NOTE) The Coronavirus on the Respiratory Panel, DOES NOT test for the novel  Coronavirus (2019 nCoV)    Coronavirus HKU1 NOT DETECTED NOT DETECTED Final   Coronavirus NL63 NOT DETECTED NOT DETECTED Final   Coronavirus OC43 NOT DETECTED NOT DETECTED Final   Metapneumovirus NOT DETECTED NOT DETECTED Final   Rhinovirus / Enterovirus DETECTED (A) NOT DETECTED Final   Influenza A NOT DETECTED NOT DETECTED Final   Influenza B NOT DETECTED NOT DETECTED Final   Parainfluenza Virus 1 NOT DETECTED NOT DETECTED Final   Parainfluenza Virus 2 NOT DETECTED NOT DETECTED Final   Parainfluenza Virus 3 NOT DETECTED NOT DETECTED Final   Parainfluenza Virus 4 NOT DETECTED NOT DETECTED Final   Respiratory Syncytial Virus NOT DETECTED NOT DETECTED Final   Bordetella pertussis NOT DETECTED NOT DETECTED Final   Bordetella Parapertussis NOT DETECTED NOT DETECTED Final   Chlamydophila pneumoniae NOT DETECTED NOT DETECTED Final   Mycoplasma pneumoniae NOT DETECTED NOT  DETECTED Final    Comment: Performed at [facility], [address], [place], [place] [address]     Radiology Studies: DG Chest Port 1 View Result Date: 08/06/2024 EXAM: 1 VIEW XRAY OF THE CHEST 08/06/2024 01:48:00 PM COMPARISON: 02/20/2024 CLINICAL HISTORY: Pt comes by EMS for SOB and weakness. Pt has been unwell for the past 3-4 days. Pt has a productive cough that looks like pus. Pt wears 3L [place]. Pt has hx of copd, abd.hernia and diabetes. FINDINGS: LUNGS AND PLEURA: Diffuse interstitial prominence which corresponds to previously characterized chronic interstitial changes possibly related to smoking related respiratory bronchiolitis. No superimposed pleural effusion, air space consolidation or pneumothorax. HEART AND MEDIASTINUM: No acute abnormality of the cardiac and mediastinal silhouettes. BONES AND SOFT TISSUES: No acute osseous abnormality. IMPRESSION: 1. No acute  process. 2. Diffuse interstitial prominence, possibly related to smoking-related respiratory bronchiolitis. Electronically signed by: Waddell Calk MD 08/06/2024 02:32 PM EDT RP Workstation: [id number]    Scheduled Meds:  azithromycin   500 mg Oral Daily   enoxaparin  (LOVENOX ) injection  40 mg Subcutaneous Q24H   fluticasone  furoate-vilanterol  1 puff Inhalation Daily   influenza vac split trivalent PF  0.5 mL Intramuscular Tomorrow-1000   insulin  aspart  0-15 Units Subcutaneous TID WC   insulin  aspart  0-5 Units Subcutaneous QHS   insulin  aspart  10 Units Subcutaneous TID WC   insulin  glargine  30 Units Subcutaneous Daily   ipratropium-albuterol   3 mL Nebulization Q6H   methylPREDNISolone  (SOLU-MEDROL ) injection  60 mg Intravenous Q12H   Followed by   NOREEN ON 08/08/2024] predniSONE   40 mg Oral Q breakfast   pantoprazole   40 mg Oral Daily   rOPINIRole   2 mg Oral TID   Continuous Infusions:  sodium chloride  40 mL/hr at 08/07/24 0805     LOS: 0 days   Time spent: 56 mins  Jaleisa Brose Vicci, MD How to contact  the [facility] Attending or Consulting provider 7A - 7P or covering provider during after hours 7P -7A, for this patient?  Check the care team in [facility] and look for a) attending/consulting TRH provider listed and b) the TRH team listed Log into [email or web address] to find provider on call.  Locate the TRH provider you are looking for under [facility] and page to a number that you can be directly reached. If you still have difficulty reaching the provider, please page the [facility] (Director on Call) for the Hospitalists listed on amion for assistance.  08/07/2024, 4:18 PM

## 2024-08-07 NOTE — Hospital Course (Signed)
 58 y.o. female with medical history significant for COPD, chronic respiratory failure on 3 L, diabetes mellitus, hypertension, chronic pain.  Patient presented to the ED with complaints of difficulty breathing generalized weakness and productive cough-per patient purulent appearing started 3 to 4 days ago.  She also reports sore throat, nasal congestion, and that she lost her voice due to severity of her symptoms.  Reports subjective chills, she did not check her temperature.  She reports nausea but no vomiting no diarrhea.  Chest x-ray negative for acute abnormality, suggest respiratory bronchiolitis.  DuoNebs, magnesium  2 g, Solu-Medrol  125 mg, 1 L bolus given.  Hospitalist to admit for COPD exacerbation.

## 2024-08-07 NOTE — Plan of Care (Signed)

## 2024-08-08 DIAGNOSIS — E1165 Type 2 diabetes mellitus with hyperglycemia: Secondary | ICD-10-CM | POA: Diagnosis not present

## 2024-08-08 DIAGNOSIS — J9611 Chronic respiratory failure with hypoxia: Secondary | ICD-10-CM | POA: Diagnosis not present

## 2024-08-08 DIAGNOSIS — J441 Chronic obstructive pulmonary disease with (acute) exacerbation: Secondary | ICD-10-CM | POA: Diagnosis not present

## 2024-08-08 DIAGNOSIS — G894 Chronic pain syndrome: Secondary | ICD-10-CM | POA: Diagnosis not present

## 2024-08-08 LAB — GLUCOSE, CAPILLARY
Glucose-Capillary: 296 mg/dL — ABNORMAL HIGH (ref 70–99)
Glucose-Capillary: 204 mg/dL — ABNORMAL HIGH (ref 70–99)
Glucose-Capillary: 149 mg/dL — ABNORMAL HIGH (ref 70–99)
Glucose-Capillary: 159 mg/dL — ABNORMAL HIGH (ref 70–99)

## 2024-08-08 MED ORDER — INSULIN GLARGINE 100 UNIT/ML ~~LOC~~ SOLN
45.0000 [IU] | Freq: Every day | SUBCUTANEOUS | Status: DC
  Administered 2024-08-08: 45 [IU] via SUBCUTANEOUS
  Filled 2024-08-08 (×3): qty 0.45

## 2024-08-08 MED ORDER — INSULIN ASPART 100 UNIT/ML IJ SOLN: 0.0000 [IU] | Freq: Every day | INTRAMUSCULAR | Status: DC

## 2024-08-08 MED ORDER — INSULIN ASPART 100 UNIT/ML IJ SOLN
15.0000 [IU] | Freq: Three times a day (TID) | INTRAMUSCULAR | Status: DC
  Administered 2024-08-08 (×2): 15 [IU] via SUBCUTANEOUS

## 2024-08-08 MED ORDER — IPRATROPIUM-ALBUTEROL 0.5-2.5 (3) MG/3ML IN SOLN
3.0000 mL | Freq: Three times a day (TID) | RESPIRATORY_TRACT | Status: AC
  Administered 2024-08-09 (×3): 3 mL via RESPIRATORY_TRACT
  Filled 2024-08-08 (×3): qty 3

## 2024-08-08 MED ORDER — GUAIFENESIN ER 600 MG PO TB12
1200.0000 mg | ORAL_TABLET | Freq: Two times a day (BID) | ORAL | Status: DC
  Administered 2024-08-08 – 2024-08-11 (×6): 1200 mg via ORAL
  Filled 2024-08-08 (×6): qty 2

## 2024-08-08 MED ORDER — INSULIN ASPART 100 UNIT/ML IJ SOLN
0.0000 [IU] | Freq: Three times a day (TID) | INTRAMUSCULAR | Status: DC
  Administered 2024-08-08: 3 [IU] via SUBCUTANEOUS
  Administered 2024-08-09: 11 [IU] via SUBCUTANEOUS
  Administered 2024-08-09 – 2024-08-10 (×3): 7 [IU] via SUBCUTANEOUS
  Administered 2024-08-10: 3 [IU] via SUBCUTANEOUS
  Administered 2024-08-10: 15 [IU] via SUBCUTANEOUS
  Administered 2024-08-11: 11 [IU] via SUBCUTANEOUS
  Administered 2024-08-11: 7 [IU] via SUBCUTANEOUS

## 2024-08-08 NOTE — Progress Notes (Signed)
 PROGRESS NOTE   [name]  FMW:[id number] DOB: 10/31/1966 DOA: 08/06/2024 PCP: Vick Lurie, FNP   Chief Complaint  Patient presents with   Shortness of Breath   Weakness   Level of care: Telemetry  Brief Admission History:  58 y.o. female with medical history significant for COPD, chronic respiratory failure on 3 L, diabetes mellitus, hypertension, chronic pain.  Patient presented to the ED with complaints of difficulty breathing generalized weakness and productive cough-per patient purulent appearing started 3 to 4 days ago.  She also reports sore throat, nasal congestion, and that she lost her voice due to severity of her symptoms.  Reports subjective chills, she did not check her temperature.  She reports nausea but no vomiting no diarrhea.  Chest x-ray negative for acute abnormality, suggest respiratory bronchiolitis.  DuoNebs, magnesium  2 g, Solu-Medrol  125 mg, 1 L bolus given.  Hospitalist to admit for COPD exacerbation.   Assessment and Plan:  COPD with acute exacerbation Presenting with dyspnea, productive cough, diffuse inspiratory and expiratory rhonchi on exam.  No increased O2 requirement.  VBG shows pH of 7.3, pCO2 of 77, suggesting chronic retention. Mental status is intact.  Chest x-ray without acute abnormality, suggest respiratory bronchiolitis.   - DuoNebs as needed and scheduled - Mucolytics - continue steroids - Azithromycin  stopped by patient due to intolerance - Follow-up blood cultures - Acute Viral Testing positive for rhinovirus/enterovirus - on droplet precautions - DC antibiotics as patient could not tolerate them anyway and continue supportive care  - add flutter valve and mucinex  1200 mg BID  DM (diabetes mellitus), type 2 uncontrolled with hyperglycemia - Hgba1c - 7.0%  - SSI- R - lantus  45 units, novolog  15 units TID with meals - Hold Glipizide , Ozempic CBG (last 3)  Recent Labs    08/07/24 2027 08/08/24 0711 08/08/24 1108  GLUCAP 257*  296* 204*   Cigarette smoker Smokes about half a pack of cigarettes daily.   - Nicotine  patch.  Hyponatremia - resolved  Chronic respiratory failure with hypoxia  Currently O2 sats 94 to 98% on home 3L.   Essential hypertension Stable.  Not on medication.  DVT prophylaxis: enoxaparin  Code Status: Full Family Communication:  Disposition: home    Consultants:   Procedures:   Antimicrobials:    Subjective: Pt reports cough, chest congestion and malaise.    Objective: Vitals:   08/07/24 1422 08/07/24 2025 08/08/24 0400 08/08/24 1216  BP:  121/60 119/73 124/69  Pulse:  (!) 104 100 (!) 103  Resp:  20 20   Temp:  99 F (37.2 C) 98.9 F (37.2 C) 98.3 F (36.8 C)  TempSrc:  Oral Oral Oral  SpO2: 92% 95% 95% 94%  Weight:      Height:        Intake/Output Summary (Last 24 hours) at 08/08/2024 1538 Last data filed at 08/08/2024 1300 Gross per 24 hour  Intake 1098.95 ml  Output --  Net 1098.95 ml   Filed Weights   08/06/24 1304 08/06/24 1654  Weight: 66.2 kg 66.3 kg   Examination:  General exam: Appears calm and comfortable  Respiratory system: diffuse rales and expiratory wheezing heard Cardiovascular system: tachycardic rate, normal S1 & S2 heard. No JVD, murmurs, rubs, gallops or clicks. No pedal edema. Gastrointestinal system: Abdomen is nondistended, soft and nontender. No organomegaly or masses felt. Normal bowel sounds heard. Central nervous system: Alert and oriented. No focal neurological deficits. Extremities: Symmetric 5 x 5 power. Skin: No rashes, lesions or ulcers. Psychiatry:  Judgement and insight appear normal. Mood & affect appropriate.   Data Reviewed: I have personally reviewed following labs and imaging studies  CBC: Recent Labs  Lab 08/06/24 1350 08/07/24 0418  WBC 13.8* 9.4  NEUTROABS 11.4*  --   HGB 12.8 12.2  HCT 41.4 40.5  MCV 87.3 90.4  PLT 303 326    Basic Metabolic Panel: Recent Labs  Lab 08/06/24 1350 08/07/24 0418  NA  128* 138  K 3.6 4.6  CL 88* 95*  CO2 30 34*  GLUCOSE 227* 243*  BUN 8 8  CREATININE 0.33* 0.40*  CALCIUM  8.1* 8.5*    CBG: Recent Labs  Lab 08/07/24 1123 08/07/24 1626 08/07/24 2027 08/08/24 0711 08/08/24 1108  GLUCAP 155* 226* 257* 296* 204*    Recent Results (from the past 240 hours)  Resp panel by RT-PCR (RSV, Flu A&B, Covid) Anterior Nasal Swab     Status: None   Collection Time: 08/06/24  1:09 PM   Specimen: Anterior Nasal Swab  Result Value Ref Range Status   SARS Coronavirus 2 by RT PCR NEGATIVE NEGATIVE Final    Comment: (NOTE) SARS-CoV-2 target nucleic acids are NOT DETECTED.  The SARS-CoV-2 RNA is generally detectable in upper respiratory specimens during the acute phase of infection. The lowest concentration of SARS-CoV-2 viral copies this assay can detect is 138 copies/mL. A negative result does not preclude SARS-Cov-2 infection and should not be used as the sole basis for treatment or other patient management decisions. A negative result may occur with  improper specimen collection/handling, submission of specimen other than nasopharyngeal swab, presence of viral mutation(s) within the areas targeted by this assay, and inadequate number of viral copies(<138 copies/mL). A negative result must be combined with clinical observations, patient history, and epidemiological information. The expected result is Negative.  Fact Sheet for Patients:  BloggerCourse.com  Fact Sheet for Healthcare Providers:  SeriousBroker.it  This test is no t yet approved or cleared by the [place]  FDA and  has been authorized for detection and/or diagnosis of SARS-CoV-2 by FDA under an Emergency Use Authorization (EUA). This EUA will remain  in effect (meaning this test can be used) for the duration of the COVID-19 declaration under Section 564(b)(1) of the Act, 21 U.S.C.section 360bbb-3(b)(1), unless the authorization is  terminated  or revoked sooner.       Influenza A by PCR NEGATIVE NEGATIVE Final   Influenza B by PCR NEGATIVE NEGATIVE Final    Comment: (NOTE) The Xpert Xpress SARS-CoV-2/FLU/RSV plus assay is intended as an aid in the diagnosis of influenza from Nasopharyngeal swab specimens and should not be used as a sole basis for treatment. Nasal washings and aspirates are unacceptable for Xpert Xpress SARS-CoV-2/FLU/RSV testing.  Fact Sheet for Patients: BloggerCourse.com  Fact Sheet for Healthcare Providers: SeriousBroker.it  This test is not yet approved or cleared by the [place]  FDA and has been authorized for detection and/or diagnosis of SARS-CoV-2 by FDA under an Emergency Use Authorization (EUA). This EUA will remain in effect (meaning this test can be used) for the duration of the COVID-19 declaration under Section 564(b)(1) of the Act, 21 U.S.C. section 360bbb-3(b)(1), unless the authorization is terminated or revoked.     Resp Syncytial Virus by PCR NEGATIVE NEGATIVE Final    Comment: (NOTE) Fact Sheet for Patients: BloggerCourse.com  Fact Sheet for Healthcare Providers: SeriousBroker.it  This test is not yet approved or cleared by the [place]  FDA and has been authorized for detection and/or diagnosis of  SARS-CoV-2 by FDA under an Emergency Use Authorization (EUA). This EUA will remain in effect (meaning this test can be used) for the duration of the COVID-19 declaration under Section 564(b)(1) of the Act, 21 U.S.C. section 360bbb-3(b)(1), unless the authorization is terminated or revoked.  Performed at [facility], [address] [place], [place] [address]   Culture, blood (routine x 2)     Status: None (Preliminary result)   Collection Time: 08/06/24  1:50 PM   Specimen: BLOOD  Result Value Ref Range Status   Specimen Description BLOOD LEFT ANTECUBITAL   Final   Special Requests   Final    BOTTLES DRAWN AEROBIC AND ANAEROBIC Blood Culture adequate volume   Culture   Final    NO GROWTH < 24 HOURS Performed at [facility], [address], [place], [place] [address]    Report Status PENDING  Incomplete  Culture, blood (routine x 2)     Status: None (Preliminary result)   Collection Time: 08/06/24  1:50 PM   Specimen: BLOOD  Result Value Ref Range Status   Specimen Description BLOOD RIGHT ANTECUBITAL  Final   Special Requests   Final    BOTTLES DRAWN AEROBIC AND ANAEROBIC Blood Culture adequate volume   Culture   Final    NO GROWTH < 24 HOURS Performed at [facility], [address] [place], [place] [address]    Report Status PENDING  Incomplete  Respiratory (~20 pathogens) panel by PCR     Status: Abnormal   Collection Time: 08/06/24  4:26 PM   Specimen: Nasopharyngeal Swab; Respiratory  Result Value Ref Range Status   Adenovirus NOT DETECTED NOT DETECTED Final   Coronavirus 229E NOT DETECTED NOT DETECTED Final    Comment: (NOTE) The Coronavirus on the Respiratory Panel, DOES NOT test for the novel  Coronavirus (2019 nCoV)    Coronavirus HKU1 NOT DETECTED NOT DETECTED Final   Coronavirus NL63 NOT DETECTED NOT DETECTED Final   Coronavirus OC43 NOT DETECTED NOT DETECTED Final   Metapneumovirus NOT DETECTED NOT DETECTED Final   Rhinovirus / Enterovirus DETECTED (A) NOT DETECTED Final   Influenza A NOT DETECTED NOT DETECTED Final   Influenza B NOT DETECTED NOT DETECTED Final   Parainfluenza Virus 1 NOT DETECTED NOT DETECTED Final   Parainfluenza Virus 2 NOT DETECTED NOT DETECTED Final   Parainfluenza Virus 3 NOT DETECTED NOT DETECTED Final   Parainfluenza Virus 4 NOT DETECTED NOT DETECTED Final   Respiratory Syncytial Virus NOT DETECTED NOT DETECTED Final   Bordetella pertussis NOT DETECTED NOT DETECTED Final   Bordetella Parapertussis NOT DETECTED NOT DETECTED Final   Chlamydophila pneumoniae NOT DETECTED NOT DETECTED Final    Mycoplasma pneumoniae NOT DETECTED NOT DETECTED Final    Comment: Performed at [facility], [address] [place], [place] [address]     Radiology Studies: No results found.   Scheduled Meds:  enoxaparin  (LOVENOX ) injection  40 mg Subcutaneous Q24H   fluticasone  furoate-vilanterol  1 puff Inhalation Daily   influenza vac split trivalent PF  0.5 mL Intramuscular Tomorrow-1000   insulin  aspart  0-15 Units Subcutaneous TID WC   insulin  aspart  0-5 Units Subcutaneous QHS   insulin  aspart  15 Units Subcutaneous TID WC   insulin  glargine  45 Units Subcutaneous Daily   ipratropium-albuterol   3 mL Nebulization Q6H   pantoprazole   40 mg Oral Daily   predniSONE   40 mg Oral Q breakfast   rOPINIRole   2 mg Oral TID  Continuous Infusions:   LOS: 1 day   Time spent: 54 mins  Alecxander Mainwaring Vicci, MD How to contact the TRH Attending or Consulting provider 7A - 7P or covering provider during after hours 7P -7A, for this patient?  Check the care team in [facility] and look for a) attending/consulting TRH provider listed and b) the TRH team listed Log into [email or web address] to find provider on call.  Locate the TRH provider you are looking for under [facility] and page to a number that you can be directly reached. If you still have difficulty reaching the provider, please page the [facility] (Director on Call) for the Hospitalists listed on amion for assistance.  08/08/2024, 3:38 PM

## 2024-08-08 NOTE — Plan of Care (Signed)
  Problem: Education: Goal: Knowledge of General Education information will improve Description: Including pain rating scale, medication(s)/side effects and non-pharmacologic comfort measures Outcome: Progressing   Problem: Health Behavior/Discharge Planning: Goal: Ability to manage health-related needs will improve Outcome: Progressing   Problem: Clinical Measurements: Goal: Ability to maintain clinical measurements within normal limits will improve Outcome: Progressing Goal: Will remain free from infection Outcome: Progressing Goal: Diagnostic test results will improve Outcome: Progressing Goal: Respiratory complications will improve Outcome: Progressing Goal: Cardiovascular complication will be avoided Outcome: Progressing   Problem: Activity: Goal: Risk for activity intolerance will decrease Outcome: Progressing   Problem: Nutrition: Goal: Adequate nutrition will be maintained Outcome: Progressing   Problem: Coping: Goal: Level of anxiety will decrease Outcome: Progressing   Problem: Elimination: Goal: Will not experience complications related to bowel motility Outcome: Progressing Goal: Will not experience complications related to urinary retention Outcome: Progressing   Problem: Pain Managment: Goal: General experience of comfort will improve and/or be controlled Outcome: Progressing   Problem: Safety: Goal: Ability to remain free from injury will improve Outcome: Progressing   Problem: Skin Integrity: Goal: Risk for impaired skin integrity will decrease Outcome: Progressing   Problem: Education: Goal: Knowledge of disease or condition will improve Outcome: Progressing Goal: Knowledge of the prescribed therapeutic regimen will improve Outcome: Progressing Goal: Individualized Educational Video(s) Outcome: Progressing   Problem: Activity: Goal: Ability to tolerate increased activity will improve Outcome: Progressing Goal: Will verbalize the  importance of balancing activity with adequate rest periods Outcome: Progressing   Problem: Respiratory: Goal: Ability to maintain a clear airway will improve Outcome: Progressing Goal: Levels of oxygenation will improve Outcome: Progressing Goal: Ability to maintain adequate ventilation will improve Outcome: Progressing   Problem: Education: Goal: Ability to describe self-care measures that may prevent or decrease complications (Diabetes Survival Skills Education) will improve Outcome: Progressing Goal: Individualized Educational Video(s) Outcome: Progressing   Problem: Coping: Goal: Ability to adjust to condition or change in health will improve Outcome: Progressing   Problem: Fluid Volume: Goal: Ability to maintain a balanced intake and output will improve Outcome: Progressing   Problem: Health Behavior/Discharge Planning: Goal: Ability to identify and utilize available resources and services will improve Outcome: Progressing Goal: Ability to manage health-related needs will improve Outcome: Progressing   Problem: Skin Integrity: Goal: Risk for impaired skin integrity will decrease Outcome: Progressing   Problem: Tissue Perfusion: Goal: Adequacy of tissue perfusion will improve Outcome: Progressing

## 2024-08-08 NOTE — Plan of Care (Signed)

## 2024-08-09 DIAGNOSIS — J9611 Chronic respiratory failure with hypoxia: Secondary | ICD-10-CM | POA: Diagnosis not present

## 2024-08-09 DIAGNOSIS — J9612 Chronic respiratory failure with hypercapnia: Secondary | ICD-10-CM | POA: Diagnosis not present

## 2024-08-09 DIAGNOSIS — Z720 Tobacco use: Secondary | ICD-10-CM

## 2024-08-09 DIAGNOSIS — J441 Chronic obstructive pulmonary disease with (acute) exacerbation: Secondary | ICD-10-CM | POA: Diagnosis not present

## 2024-08-09 LAB — GLUCOSE, CAPILLARY
Glucose-Capillary: 203 mg/dL — ABNORMAL HIGH (ref 70–99)
Glucose-Capillary: 205 mg/dL — ABNORMAL HIGH (ref 70–99)
Glucose-Capillary: 264 mg/dL — ABNORMAL HIGH (ref 70–99)
Glucose-Capillary: 122 mg/dL — ABNORMAL HIGH (ref 70–99)

## 2024-08-09 MED ORDER — ARFORMOTEROL TARTRATE 15 MCG/2ML IN NEBU
15.0000 ug | INHALATION_SOLUTION | Freq: Two times a day (BID) | RESPIRATORY_TRACT | Status: DC
  Administered 2024-08-09 – 2024-08-11 (×5): 15 ug via RESPIRATORY_TRACT
  Filled 2024-08-09 (×5): qty 2

## 2024-08-09 MED ORDER — BUDESONIDE 0.5 MG/2ML IN SUSP
0.5000 mg | Freq: Two times a day (BID) | RESPIRATORY_TRACT | Status: DC
  Administered 2024-08-09 – 2024-08-11 (×5): 0.5 mg via RESPIRATORY_TRACT
  Filled 2024-08-09 (×5): qty 2

## 2024-08-09 MED ORDER — INSULIN GLARGINE 100 UNIT/ML ~~LOC~~ SOLN
30.0000 [IU] | Freq: Every day | SUBCUTANEOUS | Status: DC
  Administered 2024-08-09 – 2024-08-10 (×2): 30 [IU] via SUBCUTANEOUS
  Filled 2024-08-09 (×3): qty 0.3

## 2024-08-09 MED ORDER — METHYLPREDNISOLONE SODIUM SUCC 40 MG IJ SOLR
40.0000 mg | Freq: Two times a day (BID) | INTRAMUSCULAR | Status: DC
  Administered 2024-08-09 – 2024-08-10 (×3): 40 mg via INTRAVENOUS
  Filled 2024-08-09 (×4): qty 1

## 2024-08-09 MED ORDER — REVEFENACIN 175 MCG/3ML IN SOLN
175.0000 ug | Freq: Every day | RESPIRATORY_TRACT | Status: DC
  Administered 2024-08-10 – 2024-08-11 (×2): 175 ug via RESPIRATORY_TRACT
  Filled 2024-08-09 (×2): qty 3

## 2024-08-09 MED ORDER — INSULIN ASPART 100 UNIT/ML IJ SOLN
10.0000 [IU] | Freq: Three times a day (TID) | INTRAMUSCULAR | Status: DC
  Administered 2024-08-09 – 2024-08-10 (×4): 10 [IU] via SUBCUTANEOUS

## 2024-08-09 MED ORDER — DIPHENHYDRAMINE HCL 50 MG/ML IJ SOLN
12.5000 mg | Freq: Once | INTRAMUSCULAR | Status: AC
  Administered 2024-08-09: 12.5 mg via INTRAVENOUS
  Filled 2024-08-09: qty 1

## 2024-08-09 MED ORDER — OXYCODONE HCL 5 MG PO TABS
5.0000 mg | ORAL_TABLET | Freq: Four times a day (QID) | ORAL | Status: DC | PRN
  Administered 2024-08-09 – 2024-08-11 (×8): 5 mg via ORAL
  Filled 2024-08-09 (×8): qty 1

## 2024-08-09 MED ORDER — KETOROLAC TROMETHAMINE 15 MG/ML IJ SOLN
15.0000 mg | Freq: Once | INTRAMUSCULAR | Status: AC
  Administered 2024-08-09: 15 mg via INTRAVENOUS
  Filled 2024-08-09: qty 1

## 2024-08-09 MED ORDER — ALBUTEROL SULFATE (2.5 MG/3ML) 0.083% IN NEBU
2.5000 mg | INHALATION_SOLUTION | Freq: Four times a day (QID) | RESPIRATORY_TRACT | Status: DC
Start: 2024-08-10 — End: 2024-08-11
  Administered 2024-08-10 – 2024-08-11 (×6): 2.5 mg via RESPIRATORY_TRACT
  Filled 2024-08-09 (×6): qty 3

## 2024-08-09 NOTE — Progress Notes (Signed)
 PROGRESS NOTE  [name] FMW:[id number] DOB: Jan 22, 1966 DOA: 08/06/2024 PCP: Vick Lurie, FNP  Brief History:  58 y.o. female with medical history significant for COPD, chronic respiratory failure on 3 L, diabetes mellitus, hypertension, chronic pain.  Patient presented to the ED with complaints of difficulty breathing generalized weakness and productive cough-per patient purulent appearing started 3 to 4 days ago.  She also reports sore throat, nasal congestion, and that she lost her voice due to severity of her symptoms.  Reports subjective chills, she did not check her temperature.  She reports nausea but no vomiting no diarrhea.  Chest x-ray negative for acute abnormality, suggest respiratory bronchiolitis.  DuoNebs, magnesium  2 g, Solu-Medrol  125 mg, 1 L bolus given.  Hospitalist to admit for COPD exacerbation.   Assessment/Plan:      chronic respiratory failure with hypoxia and hypercarbia -Chronically on 3 L nasal cannula -08/06/24 VBG 7.32/77/43/39 -Secondary COPD exacerbation and pneumonia -Wean oxygen  for saturation greater 92%  COPD exacerbation -Started Pulmicort  -Started Brovana  -add Yupelri  -restart solumedrol -viral resp panel +rhinovirus/enterovirus -COVID/flu/RSV--neg  diabetes mellitus type 2 with hyperglycemia -08/06/24 hemoglobin A1c 7.0 -Add Lantus  30 units -Add NovoLog  10 units with meals -Continue sliding scale -hold glipizide  and metformin    Pleuritic chest pain -Troponins 3>>4 -chronic   Tobacco abuse -cessation discussed        Family Communication:   no Family at bedside  Consultants:  none  Code Status:  FULL  DVT Prophylaxis:  Blunt Lovenox    Procedures: As Listed in Progress Note Above  Antibiotics: None      Subjective: She continues to have sob, only slightly better.  Planes of a nonproductive cough and chest congestion.  She denies any nausea, vomiting, diarrhea and abdominal pain.  There is no  hemoptysis.  Objective: Vitals:   08/08/24 2109 08/09/24 0534 08/09/24 0631 08/09/24 0734  BP: (!) 142/70  128/60   Pulse: (!) 101  (!) 101   Resp: 20  20   Temp: 98.8 F (37.1 C)  98.1 F (36.7 C)   TempSrc: Oral  Oral   SpO2: 94% 95% 92% 96%  Weight:      Height:        Intake/Output Summary (Last 24 hours) at 08/09/2024 1153 Last data filed at 08/09/2024 0650 Gross per 24 hour  Intake 1440 ml  Output --  Net 1440 ml   Weight change:  Exam:  General:  Pt is alert, follows commands appropriately, not in acute distress HEENT: No icterus, No thrush, No neck mass, [place]/AT Cardiovascular: RRR, S1/S2, no rubs, no gallops Respiratory: bilateral rales.  Bilateral exp wheeze Abdomen: Soft/+BS, non tender, non distended, no guarding Extremities: No edema, No lymphangitis, No petechiae, No rashes, no synovitis   Data Reviewed: I have personally reviewed following labs and imaging studies Basic Metabolic Panel: Recent Labs  Lab 08/06/24 1350 08/07/24 0418  NA 128* 138  K 3.6 4.6  CL 88* 95*  CO2 30 34*  GLUCOSE 227* 243*  BUN 8 8  CREATININE 0.33* 0.40*  CALCIUM  8.1* 8.5*   Liver Function Tests: Recent Labs  Lab 08/06/24 1350  AST 15  ALT 12  ALKPHOS 93  BILITOT 0.5  PROT 7.7  ALBUMIN 3.7   No results for input(s): LIPASE, AMYLASE in the last 168 hours. No results for input(s): AMMONIA in the last 168 hours. Coagulation Profile: No results for input(s): INR, PROTIME in the last 168 hours. CBC:  Recent Labs  Lab 08/06/24 1350 08/07/24 0418  WBC 13.8* 9.4  NEUTROABS 11.4*  --   HGB 12.8 12.2  HCT 41.4 40.5  MCV 87.3 90.4  PLT 303 326   Cardiac Enzymes: No results for input(s): CKTOTAL, CKMB, CKMBINDEX, TROPONINI in the last 168 hours. BNP: Invalid input(s): POCBNP CBG: Recent Labs  Lab 08/08/24 1108 08/08/24 1629 08/08/24 2121 08/09/24 0715 08/09/24 1150  GLUCAP 204* 149* 159* 203* 205*   HbA1C: Recent Labs     08/06/24 1350  HGBA1C 7.0*   Urine analysis:    Component Value Date/Time   COLORURINE AMBER (A) 02/20/2024 2206   APPEARANCEUR HAZY (A) 02/20/2024 2206   LABSPEC 1.018 02/20/2024 2206   PHURINE 6.0 02/20/2024 2206   GLUCOSEU 50 (A) 02/20/2024 2206   HGBUR NEGATIVE 02/20/2024 2206   BILIRUBINUR NEGATIVE 02/20/2024 2206   KETONESUR NEGATIVE 02/20/2024 2206   PROTEINUR 100 (A) 02/20/2024 2206   UROBILINOGEN 1.0 07/24/2014 1605   NITRITE POSITIVE (A) 02/20/2024 2206   LEUKOCYTESUR NEGATIVE 02/20/2024 2206   Sepsis Labs: @LABRCNTIP (procalcitonin:4,lacticidven:4) ) Recent Results (from the past 240 hours)  Resp panel by RT-PCR (RSV, Flu A&B, Covid) Anterior Nasal Swab     Status: None   Collection Time: 08/06/24  1:09 PM   Specimen: Anterior Nasal Swab  Result Value Ref Range Status   SARS Coronavirus 2 by RT PCR NEGATIVE NEGATIVE Final    Comment: (NOTE) SARS-CoV-2 target nucleic acids are NOT DETECTED.  The SARS-CoV-2 RNA is generally detectable in upper respiratory specimens during the acute phase of infection. The lowest concentration of SARS-CoV-2 viral copies this assay can detect is 138 copies/mL. A negative result does not preclude SARS-Cov-2 infection and should not be used as the sole basis for treatment or other patient management decisions. A negative result may occur with  improper specimen collection/handling, submission of specimen other than nasopharyngeal swab, presence of viral mutation(s) within the areas targeted by this assay, and inadequate number of viral copies(<138 copies/mL). A negative result must be combined with clinical observations, patient history, and epidemiological information. The expected result is Negative.  Fact Sheet for Patients:  BloggerCourse.com  Fact Sheet for Healthcare Providers:  SeriousBroker.it  This test is no t yet approved or cleared by the [place]  FDA and  has  been authorized for detection and/or diagnosis of SARS-CoV-2 by FDA under an Emergency Use Authorization (EUA). This EUA will remain  in effect (meaning this test can be used) for the duration of the COVID-19 declaration under Section 564(b)(1) of the Act, 21 U.S.C.section 360bbb-3(b)(1), unless the authorization is terminated  or revoked sooner.       Influenza A by PCR NEGATIVE NEGATIVE Final   Influenza B by PCR NEGATIVE NEGATIVE Final    Comment: (NOTE) The Xpert Xpress SARS-CoV-2/FLU/RSV plus assay is intended as an aid in the diagnosis of influenza from Nasopharyngeal swab specimens and should not be used as a sole basis for treatment. Nasal washings and aspirates are unacceptable for Xpert Xpress SARS-CoV-2/FLU/RSV testing.  Fact Sheet for Patients: BloggerCourse.com  Fact Sheet for Healthcare Providers: SeriousBroker.it  This test is not yet approved or cleared by the [place]  FDA and has been authorized for detection and/or diagnosis of SARS-CoV-2 by FDA under an Emergency Use Authorization (EUA). This EUA will remain in effect (meaning this test can be used) for the duration of the COVID-19 declaration under Section 564(b)(1) of the Act, 21 U.S.C. section 360bbb-3(b)(1), unless the authorization is terminated or revoked.  Resp Syncytial Virus by PCR NEGATIVE NEGATIVE Final    Comment: (NOTE) Fact Sheet for Patients: BloggerCourse.com  Fact Sheet for Healthcare Providers: SeriousBroker.it  This test is not yet approved or cleared by the [place]  FDA and has been authorized for detection and/or diagnosis of SARS-CoV-2 by FDA under an Emergency Use Authorization (EUA). This EUA will remain in effect (meaning this test can be used) for the duration of the COVID-19 declaration under Section 564(b)(1) of the Act, 21 U.S.C. section 360bbb-3(b)(1), unless the  authorization is terminated or revoked.  Performed at [facility], [address], [place], [place] [address]   Culture, blood (routine x 2)     Status: None (Preliminary result)   Collection Time: 08/06/24  1:50 PM   Specimen: BLOOD  Result Value Ref Range Status   Specimen Description BLOOD LEFT ANTECUBITAL  Final   Special Requests   Final    BOTTLES DRAWN AEROBIC AND ANAEROBIC Blood Culture adequate volume   Culture   Final    NO GROWTH 3 DAYS Performed at [facility], [address], [place], [place] [address]    Report Status PENDING  Incomplete  Culture, blood (routine x 2)     Status: None (Preliminary result)   Collection Time: 08/06/24  1:50 PM   Specimen: BLOOD  Result Value Ref Range Status   Specimen Description BLOOD RIGHT ANTECUBITAL  Final   Special Requests   Final    BOTTLES DRAWN AEROBIC AND ANAEROBIC Blood Culture adequate volume   Culture   Final    NO GROWTH 3 DAYS Performed at [facility], [address], [place], [place] [address]    Report Status PENDING  Incomplete  Respiratory (~20 pathogens) panel by PCR     Status: Abnormal   Collection Time: 08/06/24  4:26 PM   Specimen: Nasopharyngeal Swab; Respiratory  Result Value Ref Range Status   Adenovirus NOT DETECTED NOT DETECTED Final   Coronavirus 229E NOT DETECTED NOT DETECTED Final    Comment: (NOTE) The Coronavirus on the Respiratory Panel, DOES NOT test for the novel  Coronavirus (2019 nCoV)    Coronavirus HKU1 NOT DETECTED NOT DETECTED Final   Coronavirus NL63 NOT DETECTED NOT DETECTED Final   Coronavirus OC43 NOT DETECTED NOT DETECTED Final   Metapneumovirus NOT DETECTED NOT DETECTED Final   Rhinovirus / Enterovirus DETECTED (A) NOT DETECTED Final   Influenza A NOT DETECTED NOT DETECTED Final   Influenza B NOT DETECTED NOT DETECTED Final   Parainfluenza Virus 1 NOT DETECTED NOT DETECTED Final   Parainfluenza Virus 2 NOT DETECTED NOT DETECTED Final   Parainfluenza Virus 3 NOT DETECTED NOT  DETECTED Final   Parainfluenza Virus 4 NOT DETECTED NOT DETECTED Final   Respiratory Syncytial Virus NOT DETECTED NOT DETECTED Final   Bordetella pertussis NOT DETECTED NOT DETECTED Final   Bordetella Parapertussis NOT DETECTED NOT DETECTED Final   Chlamydophila pneumoniae NOT DETECTED NOT DETECTED Final   Mycoplasma pneumoniae NOT DETECTED NOT DETECTED Final    Comment: Performed at [facility], [address], [place], [place] [address]     Scheduled Meds:  [START ON 08/10/2024] albuterol   2.5 mg Nebulization QID   arformoterol   15 mcg Nebulization BID   budesonide  (PULMICORT ) nebulizer solution  0.5 mg Nebulization BID   enoxaparin  (LOVENOX ) injection  40 mg Subcutaneous Q24H   guaiFENesin   1,200 mg Oral BID   influenza vac split trivalent PF  0.5 mL Intramuscular Tomorrow-1000   insulin  aspart  0-20 Units Subcutaneous TID WC   insulin  aspart  0-5 Units Subcutaneous QHS   insulin  aspart  10 Units Subcutaneous TID WC   insulin  glargine  30 Units Subcutaneous Daily   ipratropium-albuterol   3 mL Nebulization TID   methylPREDNISolone  (SOLU-MEDROL ) injection  40 mg Intravenous Q12H   pantoprazole   40 mg Oral Daily   [START ON 08/10/2024] revefenacin   175 mcg Nebulization Daily   rOPINIRole   2 mg Oral TID   Continuous Infusions:  Procedures/Studies: DG Chest Port 1 View Result Date: 08/06/2024 EXAM: 1 VIEW XRAY OF THE CHEST 08/06/2024 01:48:00 PM COMPARISON: 02/20/2024 CLINICAL HISTORY: Pt comes by EMS for SOB and weakness. Pt has been unwell for the past 3-4 days. Pt has a productive cough that looks like pus. Pt wears 3L [place]. Pt has hx of copd, abd.hernia and diabetes. FINDINGS: LUNGS AND PLEURA: Diffuse interstitial prominence which corresponds to previously characterized chronic interstitial changes possibly related to smoking related respiratory bronchiolitis. No superimposed pleural effusion, air space consolidation or pneumothorax. HEART AND MEDIASTINUM: No acute abnormality  of the cardiac and mediastinal silhouettes. BONES AND SOFT TISSUES: No acute osseous abnormality. IMPRESSION: 1. No acute process. 2. Diffuse interstitial prominence, possibly related to smoking-related respiratory bronchiolitis. Electronically signed by: Waddell Calk MD 08/06/2024 02:32 PM EDT RP Workstation: [id number]    Alm Schneider, DO  [facility]  If 7PM-7AM, please contact night-coverage [email or web address] Password [email or web address] 08/09/2024, 11:53 AM   LOS: 2 days

## 2024-08-10 DIAGNOSIS — F17210 Nicotine dependence, cigarettes, uncomplicated: Secondary | ICD-10-CM | POA: Diagnosis not present

## 2024-08-10 DIAGNOSIS — E1165 Type 2 diabetes mellitus with hyperglycemia: Secondary | ICD-10-CM

## 2024-08-10 DIAGNOSIS — J9611 Chronic respiratory failure with hypoxia: Secondary | ICD-10-CM | POA: Diagnosis not present

## 2024-08-10 DIAGNOSIS — G894 Chronic pain syndrome: Secondary | ICD-10-CM | POA: Diagnosis not present

## 2024-08-10 DIAGNOSIS — J441 Chronic obstructive pulmonary disease with (acute) exacerbation: Secondary | ICD-10-CM | POA: Diagnosis not present

## 2024-08-10 DIAGNOSIS — Z794 Long term (current) use of insulin: Secondary | ICD-10-CM

## 2024-08-10 LAB — BASIC METABOLIC PANEL WITH GFR
Anion gap: 11 (ref 5–15)
BUN: 19 mg/dL (ref 6–20)
CO2: 33 mmol/L — ABNORMAL HIGH (ref 22–32)
Calcium: 9.1 mg/dL (ref 8.9–10.3)
Chloride: 90 mmol/L — ABNORMAL LOW (ref 98–111)
Creatinine, Ser: 0.59 mg/dL (ref 0.44–1.00)
GFR, Estimated: >60 mL/min (ref >60)
Glucose, Bld: 299 mg/dL — ABNORMAL HIGH (ref 70–99)
Potassium: 4.1 mmol/L (ref 3.5–5.1)
Sodium: 134 mmol/L — ABNORMAL LOW (ref 135–145)

## 2024-08-10 LAB — MAGNESIUM: Magnesium: 1.8 mg/dL (ref 1.7–2.4)

## 2024-08-10 LAB — GLUCOSE, CAPILLARY
Glucose-Capillary: 313 mg/dL — ABNORMAL HIGH (ref 70–99)
Glucose-Capillary: 231 mg/dL — ABNORMAL HIGH (ref 70–99)
Glucose-Capillary: 318 mg/dL — ABNORMAL HIGH (ref 70–99)
Glucose-Capillary: 147 mg/dL — ABNORMAL HIGH (ref 70–99)
Glucose-Capillary: 96 mg/dL (ref 70–99)

## 2024-08-10 LAB — PHOSPHORUS: Phosphorus: 4.5 mg/dL (ref 2.5–4.6)

## 2024-08-10 MED ORDER — DIPHENHYDRAMINE HCL 50 MG/ML IJ SOLN
12.5000 mg | Freq: Once | INTRAMUSCULAR | Status: AC
  Administered 2024-08-10: 12.5 mg via INTRAVENOUS
  Filled 2024-08-10: qty 1

## 2024-08-10 MED ORDER — INSULIN ASPART 100 UNIT/ML IJ SOLN
14.0000 [IU] | Freq: Three times a day (TID) | INTRAMUSCULAR | Status: DC
  Administered 2024-08-10 – 2024-08-11 (×3): 14 [IU] via SUBCUTANEOUS

## 2024-08-10 MED ORDER — SENNOSIDES-DOCUSATE SODIUM 8.6-50 MG PO TABS
2.0000 | ORAL_TABLET | Freq: Once | ORAL | Status: AC
  Administered 2024-08-10: 2 via ORAL
  Filled 2024-08-10: qty 2

## 2024-08-10 MED ORDER — KETOROLAC TROMETHAMINE 15 MG/ML IJ SOLN
15.0000 mg | Freq: Once | INTRAMUSCULAR | Status: AC
  Administered 2024-08-10: 15 mg via INTRAVENOUS
  Filled 2024-08-10: qty 1

## 2024-08-10 MED ORDER — INSULIN GLARGINE 100 UNIT/ML ~~LOC~~ SOLN
35.0000 [IU] | Freq: Every day | SUBCUTANEOUS | Status: DC
  Administered 2024-08-11: 35 [IU] via SUBCUTANEOUS
  Filled 2024-08-10 (×2): qty 0.35

## 2024-08-10 MED ORDER — KETOROLAC TROMETHAMINE 15 MG/ML IJ SOLN
INTRAMUSCULAR | Status: AC
  Filled 2024-08-10: qty 1

## 2024-08-10 NOTE — Plan of Care (Signed)
  Problem: Education: Goal: Knowledge of General Education information will improve Description: Including pain rating scale, medication(s)/side effects and non-pharmacologic comfort measures Outcome: Progressing   Problem: Health Behavior/Discharge Planning: Goal: Ability to manage health-related needs will improve Outcome: Progressing   Problem: Clinical Measurements: Goal: Ability to maintain clinical measurements within normal limits will improve Outcome: Progressing Goal: Will remain free from infection Outcome: Progressing Goal: Diagnostic test results will improve Outcome: Progressing Goal: Respiratory complications will improve Outcome: Progressing Goal: Cardiovascular complication will be avoided Outcome: Progressing   Problem: Activity: Goal: Risk for activity intolerance will decrease Outcome: Progressing   Problem: Nutrition: Goal: Adequate nutrition will be maintained Outcome: Progressing   Problem: Coping: Goal: Level of anxiety will decrease Outcome: Progressing   Problem: Elimination: Goal: Will not experience complications related to bowel motility Outcome: Progressing Goal: Will not experience complications related to urinary retention Outcome: Progressing   Problem: Pain Managment: Goal: General experience of comfort will improve and/or be controlled Outcome: Progressing   Problem: Safety: Goal: Ability to remain free from injury will improve Outcome: Progressing   Problem: Skin Integrity: Goal: Risk for impaired skin integrity will decrease Outcome: Progressing   Problem: Education: Goal: Knowledge of disease or condition will improve Outcome: Progressing Goal: Knowledge of the prescribed therapeutic regimen will improve Outcome: Progressing Goal: Individualized Educational Video(s) Outcome: Progressing   Problem: Activity: Goal: Ability to tolerate increased activity will improve Outcome: Progressing Goal: Will verbalize the  importance of balancing activity with adequate rest periods Outcome: Progressing   Problem: Respiratory: Goal: Ability to maintain a clear airway will improve Outcome: Progressing Goal: Levels of oxygenation will improve Outcome: Progressing Goal: Ability to maintain adequate ventilation will improve Outcome: Progressing   Problem: Education: Goal: Ability to describe self-care measures that may prevent or decrease complications (Diabetes Survival Skills Education) will improve Outcome: Progressing Goal: Individualized Educational Video(s) Outcome: Progressing

## 2024-08-10 NOTE — Inpatient Diabetes Management (Signed)
 Inpatient Diabetes Program Recommendations  AACE/ADA: New Consensus Statement on Inpatient Glycemic Control   Target Ranges:  Prepandial:   less than 140 mg/dL      Peak postprandial:   less than 180 mg/dL (1-2 hours)      Critically ill patients:  140 - 180 mg/dL    Latest Reference Range & Units 08/09/24 07:15 08/09/24 11:50 08/09/24 16:12 08/09/24 21:02 08/10/24 04:06 08/10/24 07:27  Glucose-Capillary 70 - 99 mg/dL 796 (H) 794 (H) 735 (H) 122 (H) 313 (H) 231 (H)   Review of Glycemic Control  Diabetes history: DM2 Outpatient Diabetes medications: Glipizide  20 mg QAM, Ozempic 1 mg Qweek, Lantus  10 units at bedtime  Current orders for Inpatient glycemic control: Lantus  30 units daily, Novolog  10 units TID with meals, Novolog  0-20 units TID with meals, Novolog  0-5 units at bedtime; Solumedrol 40 mg Q12H  Inpatient Diabetes Program Recommendations:    Insulin : If steroids are continued as ordered please consider increasing Lantus  to 35 units daily and meal coverage to Novolog  14 units TID with meals.  Thanks, Earnie Gainer, RN, MSN, CDCES Diabetes Coordinator Inpatient Diabetes Program [phone] (Team Pager from 8am to 5pm)

## 2024-08-10 NOTE — Progress Notes (Signed)
 PROGRESS NOTE  [name] FMW:[id number] DOB: 1966-02-04 DOA: 08/06/2024 PCP: Vick Lurie, FNP  Brief History:  58 y.o. female with medical history significant for COPD, chronic respiratory failure on 3 L, diabetes mellitus, hypertension, chronic pain.  Patient presented to the ED with complaints of difficulty breathing generalized weakness and productive cough-per patient purulent appearing started 3 to 4 days ago.  She also reports sore throat, nasal congestion, and that she lost her voice due to severity of her symptoms.  Reports subjective chills, she did not check her temperature.  She reports nausea but no vomiting no diarrhea.  Chest x-ray negative for acute abnormality, suggest respiratory bronchiolitis.  DuoNebs, magnesium  2 g, Solu-Medrol  125 mg, 1 L bolus given.  Hospitalist to admit for COPD exacerbation.   Assessment/Plan:  chronic respiratory failure with hypoxia and hypercarbia -Chronically on 3 L nasal cannula -08/06/24 VBG 7.32/77/43/39 -Secondary COPD exacerbation and pneumonia -Wean oxygen  for saturation greater 92%   COPD exacerbation -Started Pulmicort  -Started Brovana  -added Yupelri  -restarted solumedrol -viral resp panel +rhinovirus/enterovirus -COVID/flu/RSV--neg   diabetes mellitus type 2 with hyperglycemia -08/06/24 hemoglobin A1c 7.0 -Increase Lantus  35 units -Increase NovoLog  14 units with meals -Continue sliding scale -hold glipizide  and metformin    Pleuritic chest pain -Troponins 3>>4 -chronic   Tobacco abuse -cessation discussed  Migraine Headache -IV benadryl  x 1 and toradol  IV             Family Communication:   no Family at bedside   Consultants:  none   Code Status:  FULL   DVT Prophylaxis:   Lovenox      Procedures: As Listed in Progress Note Above   Antibiotics: None         Subjective: Pt states she is breathing better.  Denies f/c, cp, n/v/d, abd pain.  Has dry cough  Objective: Vitals:    08/09/24 2018 08/10/24 0404 08/10/24 0744 08/10/24 1250  BP:  125/74  124/78  Pulse:  99  (!) 106  Resp:  16  19  Temp:  98.2 F (36.8 C)  98.7 F (37.1 C)  TempSrc:  Oral  Oral  SpO2: 95% 94% 98% 94%  Weight:      Height:        Intake/Output Summary (Last 24 hours) at 08/10/2024 1450 Last data filed at 08/09/2024 2100 Gross per 24 hour  Intake 240 ml  Output --  Net 240 ml   Weight change:  Exam:  General:  Pt is alert, follows commands appropriately, not in acute distress HEENT: No icterus, No thrush, No neck mass, [place]/AT Cardiovascular: RRR, S1/S2, no rubs, no gallops Respiratory: bibasilar wheeze.  Scattered rales Abdomen: Soft/+BS, non tender, non distended, no guarding Extremities: No edema, No lymphangitis, No petechiae, No rashes, no synovitis   Data Reviewed: I have personally reviewed following labs and imaging studies Basic Metabolic Panel: Recent Labs  Lab 08/06/24 1350 08/07/24 0418 08/10/24 0440  NA 128* 138 134*  K 3.6 4.6 4.1  CL 88* 95* 90*  CO2 30 34* 33*  GLUCOSE 227* 243* 299*  BUN 8 8 19   CREATININE 0.33* 0.40* 0.59  CALCIUM  8.1* 8.5* 9.1  MG  --   --  1.8  PHOS  --   --  4.5   Liver Function Tests: Recent Labs  Lab 08/06/24 1350  AST 15  ALT 12  ALKPHOS 93  BILITOT 0.5  PROT 7.7  ALBUMIN 3.7   No results for  input(s): LIPASE, AMYLASE in the last 168 hours. No results for input(s): AMMONIA in the last 168 hours. Coagulation Profile: No results for input(s): INR, PROTIME in the last 168 hours. CBC: Recent Labs  Lab 08/06/24 1350 08/07/24 0418  WBC 13.8* 9.4  NEUTROABS 11.4*  --   HGB 12.8 12.2  HCT 41.4 40.5  MCV 87.3 90.4  PLT 303 326   Cardiac Enzymes: No results for input(s): CKTOTAL, CKMB, CKMBINDEX, TROPONINI in the last 168 hours. BNP: Invalid input(s): POCBNP CBG: Recent Labs  Lab 08/09/24 1612 08/09/24 2102 08/10/24 0406 08/10/24 0727 08/10/24 1107  GLUCAP 264* 122* 313* 231* 318*    HbA1C: No results for input(s): HGBA1C in the last 72 hours. Urine analysis:    Component Value Date/Time   COLORURINE AMBER (A) 02/20/2024 2206   APPEARANCEUR HAZY (A) 02/20/2024 2206   LABSPEC 1.018 02/20/2024 2206   PHURINE 6.0 02/20/2024 2206   GLUCOSEU 50 (A) 02/20/2024 2206   HGBUR NEGATIVE 02/20/2024 2206   BILIRUBINUR NEGATIVE 02/20/2024 2206   KETONESUR NEGATIVE 02/20/2024 2206   PROTEINUR 100 (A) 02/20/2024 2206   UROBILINOGEN 1.0 07/24/2014 1605   NITRITE POSITIVE (A) 02/20/2024 2206   LEUKOCYTESUR NEGATIVE 02/20/2024 2206   Sepsis Labs: @LABRCNTIP (procalcitonin:4,lacticidven:4) ) Recent Results (from the past 240 hours)  Resp panel by RT-PCR (RSV, Flu A&B, Covid) Anterior Nasal Swab     Status: None   Collection Time: 08/06/24  1:09 PM   Specimen: Anterior Nasal Swab  Result Value Ref Range Status   SARS Coronavirus 2 by RT PCR NEGATIVE NEGATIVE Final    Comment: (NOTE) SARS-CoV-2 target nucleic acids are NOT DETECTED.  The SARS-CoV-2 RNA is generally detectable in upper respiratory specimens during the acute phase of infection. The lowest concentration of SARS-CoV-2 viral copies this assay can detect is 138 copies/mL. A negative result does not preclude SARS-Cov-2 infection and should not be used as the sole basis for treatment or other patient management decisions. A negative result may occur with  improper specimen collection/handling, submission of specimen other than nasopharyngeal swab, presence of viral mutation(s) within the areas targeted by this assay, and inadequate number of viral copies(<138 copies/mL). A negative result must be combined with clinical observations, patient history, and epidemiological information. The expected result is Negative.  Fact Sheet for Patients:  BloggerCourse.com  Fact Sheet for Healthcare Providers:  SeriousBroker.it  This test is no t yet approved or cleared  by the [place]  FDA and  has been authorized for detection and/or diagnosis of SARS-CoV-2 by FDA under an Emergency Use Authorization (EUA). This EUA will remain  in effect (meaning this test can be used) for the duration of the COVID-19 declaration under Section 564(b)(1) of the Act, 21 U.S.C.section 360bbb-3(b)(1), unless the authorization is terminated  or revoked sooner.       Influenza A by PCR NEGATIVE NEGATIVE Final   Influenza B by PCR NEGATIVE NEGATIVE Final    Comment: (NOTE) The Xpert Xpress SARS-CoV-2/FLU/RSV plus assay is intended as an aid in the diagnosis of influenza from Nasopharyngeal swab specimens and should not be used as a sole basis for treatment. Nasal washings and aspirates are unacceptable for Xpert Xpress SARS-CoV-2/FLU/RSV testing.  Fact Sheet for Patients: BloggerCourse.com  Fact Sheet for Healthcare Providers: SeriousBroker.it  This test is not yet approved or cleared by the [place]  FDA and has been authorized for detection and/or diagnosis of SARS-CoV-2 by FDA under an Emergency Use Authorization (EUA). This EUA will remain in effect (meaning  this test can be used) for the duration of the COVID-19 declaration under Section 564(b)(1) of the Act, 21 U.S.C. section 360bbb-3(b)(1), unless the authorization is terminated or revoked.     Resp Syncytial Virus by PCR NEGATIVE NEGATIVE Final    Comment: (NOTE) Fact Sheet for Patients: BloggerCourse.com  Fact Sheet for Healthcare Providers: SeriousBroker.it  This test is not yet approved or cleared by the [place]  FDA and has been authorized for detection and/or diagnosis of SARS-CoV-2 by FDA under an Emergency Use Authorization (EUA). This EUA will remain in effect (meaning this test can be used) for the duration of the COVID-19 declaration under Section 564(b)(1) of the Act, 21  U.S.C. section 360bbb-3(b)(1), unless the authorization is terminated or revoked.  Performed at [facility], [address], [place], [place] [address]   Culture, blood (routine x 2)     Status: None (Preliminary result)   Collection Time: 08/06/24  1:50 PM   Specimen: BLOOD  Result Value Ref Range Status   Specimen Description BLOOD LEFT ANTECUBITAL  Final   Special Requests   Final    BOTTLES DRAWN AEROBIC AND ANAEROBIC Blood Culture adequate volume   Culture   Final    NO GROWTH 4 DAYS Performed at [facility], [address], [place], [place] [address]    Report Status PENDING  Incomplete  Culture, blood (routine x 2)     Status: None (Preliminary result)   Collection Time: 08/06/24  1:50 PM   Specimen: BLOOD  Result Value Ref Range Status   Specimen Description BLOOD RIGHT ANTECUBITAL  Final   Special Requests   Final    BOTTLES DRAWN AEROBIC AND ANAEROBIC Blood Culture adequate volume   Culture   Final    NO GROWTH 4 DAYS Performed at [facility], [address], [place], [place] [address]    Report Status PENDING  Incomplete  Respiratory (~20 pathogens) panel by PCR     Status: Abnormal   Collection Time: 08/06/24  4:26 PM   Specimen: Nasopharyngeal Swab; Respiratory  Result Value Ref Range Status   Adenovirus NOT DETECTED NOT DETECTED Final   Coronavirus 229E NOT DETECTED NOT DETECTED Final    Comment: (NOTE) The Coronavirus on the Respiratory Panel, DOES NOT test for the novel  Coronavirus (2019 nCoV)    Coronavirus HKU1 NOT DETECTED NOT DETECTED Final   Coronavirus NL63 NOT DETECTED NOT DETECTED Final   Coronavirus OC43 NOT DETECTED NOT DETECTED Final   Metapneumovirus NOT DETECTED NOT DETECTED Final   Rhinovirus / Enterovirus DETECTED (A) NOT DETECTED Final   Influenza A NOT DETECTED NOT DETECTED Final   Influenza B NOT DETECTED NOT DETECTED Final   Parainfluenza Virus 1 NOT DETECTED NOT DETECTED Final   Parainfluenza Virus 2 NOT DETECTED NOT DETECTED Final    Parainfluenza Virus 3 NOT DETECTED NOT DETECTED Final   Parainfluenza Virus 4 NOT DETECTED NOT DETECTED Final   Respiratory Syncytial Virus NOT DETECTED NOT DETECTED Final   Bordetella pertussis NOT DETECTED NOT DETECTED Final   Bordetella Parapertussis NOT DETECTED NOT DETECTED Final   Chlamydophila pneumoniae NOT DETECTED NOT DETECTED Final   Mycoplasma pneumoniae NOT DETECTED NOT DETECTED Final    Comment: Performed at [facility], [address] [place], [place] [address]     Scheduled Meds:  albuterol   2.5 mg Nebulization QID   arformoterol   15 mcg Nebulization BID   budesonide  (PULMICORT ) nebulizer solution  0.5 mg Nebulization BID   diphenhydrAMINE   12.5  mg Intravenous Once   enoxaparin  (LOVENOX ) injection  40 mg Subcutaneous Q24H   guaiFENesin   1,200 mg Oral BID   influenza vac split trivalent PF  0.5 mL Intramuscular Tomorrow-1000   insulin  aspart  0-20 Units Subcutaneous TID WC   insulin  aspart  0-5 Units Subcutaneous QHS   insulin  aspart  14 Units Subcutaneous TID WC   [START ON 08/11/2024] insulin  glargine  35 Units Subcutaneous Daily   ketorolac   15 mg Intravenous Once   methylPREDNISolone  (SOLU-MEDROL ) injection  40 mg Intravenous Q12H   pantoprazole   40 mg Oral Daily   revefenacin   175 mcg Nebulization Daily   rOPINIRole   2 mg Oral TID   Continuous Infusions:  Procedures/Studies: DG Chest Port 1 View Result Date: 08/06/2024 EXAM: 1 VIEW XRAY OF THE CHEST 08/06/2024 01:48:00 PM COMPARISON: 02/20/2024 CLINICAL HISTORY: Pt comes by EMS for SOB and weakness. Pt has been unwell for the past 3-4 days. Pt has a productive cough that looks like pus. Pt wears 3L [place]. Pt has hx of copd, abd.hernia and diabetes. FINDINGS: LUNGS AND PLEURA: Diffuse interstitial prominence which corresponds to previously characterized chronic interstitial changes possibly related to smoking related respiratory bronchiolitis. No superimposed pleural effusion, air space consolidation or  pneumothorax. HEART AND MEDIASTINUM: No acute abnormality of the cardiac and mediastinal silhouettes. BONES AND SOFT TISSUES: No acute osseous abnormality. IMPRESSION: 1. No acute process. 2. Diffuse interstitial prominence, possibly related to smoking-related respiratory bronchiolitis. Electronically signed by: Waddell Calk MD 08/06/2024 02:32 PM EDT RP Workstation: [id number]    Alm Schneider, DO  [facility]  If 7PM-7AM, please contact night-coverage [email or web address] Password [email or web address] 08/10/2024, 2:50 PM   LOS: 3 days

## 2024-08-10 NOTE — Plan of Care (Signed)

## 2024-08-11 ENCOUNTER — Telehealth: Payer: Self-pay | Admitting: Acute Care

## 2024-08-11 ENCOUNTER — Other Ambulatory Visit: Payer: Self-pay | Admitting: Acute Care

## 2024-08-11 DIAGNOSIS — R911 Solitary pulmonary nodule: Secondary | ICD-10-CM

## 2024-08-11 DIAGNOSIS — J441 Chronic obstructive pulmonary disease with (acute) exacerbation: Secondary | ICD-10-CM | POA: Diagnosis not present

## 2024-08-11 DIAGNOSIS — J9611 Chronic respiratory failure with hypoxia: Secondary | ICD-10-CM | POA: Diagnosis not present

## 2024-08-11 DIAGNOSIS — Z794 Long term (current) use of insulin: Secondary | ICD-10-CM | POA: Diagnosis not present

## 2024-08-11 DIAGNOSIS — E1165 Type 2 diabetes mellitus with hyperglycemia: Secondary | ICD-10-CM | POA: Diagnosis not present

## 2024-08-11 LAB — CULTURE, BLOOD (ROUTINE X 2)
Culture: NO GROWTH
Culture: NO GROWTH
Special Requests: ADEQUATE
Special Requests: ADEQUATE

## 2024-08-11 LAB — GLUCOSE, CAPILLARY
Glucose-Capillary: 285 mg/dL — ABNORMAL HIGH (ref 70–99)
Glucose-Capillary: 260 mg/dL — ABNORMAL HIGH (ref 70–99)
Glucose-Capillary: 204 mg/dL — ABNORMAL HIGH (ref 70–99)

## 2024-08-11 MED ORDER — PREDNISONE 20 MG PO TABS
60.0000 mg | ORAL_TABLET | Freq: Every day | ORAL | Status: DC
  Administered 2024-08-11: 60 mg via ORAL
  Filled 2024-08-11: qty 3

## 2024-08-11 MED ORDER — PREDNISONE 10 MG PO TABS: 60.0000 mg | ORAL_TABLET | Freq: Every day | ORAL | 0 refills | Status: DC

## 2024-08-11 MED ORDER — OXYCODONE HCL 5 MG PO TABS: 5.0000 mg | ORAL_TABLET | Freq: Four times a day (QID) | ORAL | 0 refills | Status: DC | PRN

## 2024-08-11 MED ORDER — NOVOLOG FLEXPEN 100 UNIT/ML ~~LOC~~ SOPN: PEN_INJECTOR | SUBCUTANEOUS | 0 refills | Status: AC

## 2024-08-11 MED ORDER — LANTUS SOLOSTAR 100 UNIT/ML ~~LOC~~ SOPN: 30.0000 [IU] | PEN_INJECTOR | Freq: Every day | SUBCUTANEOUS | 0 refills | Status: AC

## 2024-08-11 MED ORDER — OXYCODONE HCL 5 MG PO TABS
5.0000 mg | ORAL_TABLET | Freq: Four times a day (QID) | ORAL | Status: DC | PRN
  Administered 2024-08-11: 5 mg via ORAL
  Filled 2024-08-11: qty 1

## 2024-08-11 NOTE — Plan of Care (Signed)
  Problem: Education: Goal: Knowledge of General Education information will improve Description: Including pain rating scale, medication(s)/side effects and non-pharmacologic comfort measures Outcome: Progressing   Problem: Health Behavior/Discharge Planning: Goal: Ability to manage health-related needs will improve Outcome: Progressing   Problem: Clinical Measurements: Goal: Ability to maintain clinical measurements within normal limits will improve Outcome: Progressing Goal: Will remain free from infection Outcome: Progressing Goal: Diagnostic test results will improve Outcome: Progressing Goal: Respiratory complications will improve Outcome: Progressing Goal: Cardiovascular complication will be avoided Outcome: Progressing   Problem: Activity: Goal: Risk for activity intolerance will decrease Outcome: Progressing   Problem: Nutrition: Goal: Adequate nutrition will be maintained Outcome: Progressing   Problem: Coping: Goal: Level of anxiety will decrease Outcome: Progressing   Problem: Elimination: Goal: Will not experience complications related to bowel motility Outcome: Progressing Goal: Will not experience complications related to urinary retention Outcome: Progressing   Problem: Pain Managment: Goal: General experience of comfort will improve and/or be controlled Outcome: Progressing   Problem: Safety: Goal: Ability to remain free from injury will improve Outcome: Progressing   Problem: Skin Integrity: Goal: Risk for impaired skin integrity will decrease Outcome: Progressing   Problem: Education: Goal: Knowledge of disease or condition will improve Outcome: Progressing Goal: Knowledge of the prescribed therapeutic regimen will improve Outcome: Progressing   Problem: Activity: Goal: Ability to tolerate increased activity will improve Outcome: Progressing Goal: Will verbalize the importance of balancing activity with adequate rest periods Outcome:  Progressing   Problem: Respiratory: Goal: Ability to maintain a clear airway will improve Outcome: Progressing Goal: Levels of oxygenation will improve Outcome: Progressing Goal: Ability to maintain adequate ventilation will improve Outcome: Progressing   Problem: Education: Goal: Ability to describe self-care measures that may prevent or decrease complications (Diabetes Survival Skills Education) will improve Outcome: Progressing Goal: Individualized Educational Video(s) Outcome: Progressing   Problem: Coping: Goal: Ability to adjust to condition or change in health will improve Outcome: Progressing   Problem: Fluid Volume: Goal: Ability to maintain a balanced intake and output will improve Outcome: Progressing   Problem: Health Behavior/Discharge Planning: Goal: Ability to identify and utilize available resources and services will improve Outcome: Progressing Goal: Ability to manage health-related needs will improve Outcome: Progressing   Problem: Metabolic: Goal: Ability to maintain appropriate glucose levels will improve Outcome: Progressing   Problem: Nutritional: Goal: Maintenance of adequate nutrition will improve Outcome: Progressing Goal: Progress toward achieving an optimal weight will improve Outcome: Progressing   Problem: Skin Integrity: Goal: Risk for impaired skin integrity will decrease Outcome: Progressing   Problem: Tissue Perfusion: Goal: Adequacy of tissue perfusion will improve Outcome: Progressing

## 2024-08-11 NOTE — Progress Notes (Signed)
 Pt discharged via WC to POV with home O2 on. Discharge instructions given to pt by W. Early, RN. Pt states understanding.

## 2024-08-11 NOTE — Telephone Encounter (Signed)
 Lung cancer screening was read as LR 4. This nodule has grown 8.9 to 13 mm in 3 months.I have ordered a PET scan and she will need follow up with me to review results and determine next best steps in plan of care.  I have ordered a PET scan. She needs to have this done 2-3 weeks after discharge from the hospital. Then lung nodule consult.  Please let her PCP know plan for care. Thanks so much

## 2024-08-11 NOTE — Discharge Summary (Signed)
 Physician Discharge Summary   Patient: [name] MRN: [id number] DOB: [birth year]  Admit date:     08/06/2024  Discharge date: 08/11/24  Discharge Physician: Alm Dalyah Pla   PCP: Vick Lurie, FNP   Recommendations at discharge:   Please follow up with primary care provider within 1-2 weeks  Please repeat BMP and CBC in one week    Hospital Course: 58 y.o. female with medical history significant for COPD, chronic respiratory failure on 3 L, diabetes mellitus, hypertension, chronic pain.  Patient presented to the ED with complaints of difficulty breathing generalized weakness and productive cough-per patient purulent appearing started 3 to 4 days ago.  She also reports sore throat, nasal congestion, and that she lost her voice due to severity of her symptoms.  Reports subjective chills, she did not check her temperature.  She reports nausea but no vomiting no diarrhea.  Chest x-ray negative for acute abnormality, suggest respiratory bronchiolitis.  DuoNebs, magnesium  2 g, Solu-Medrol  125 mg, 1 L bolus given.  Hospitalist to admit for COPD exacerbation.  Assessment and Plan: chronic respiratory failure with hypoxia and hypercarbia -Chronically on 3 L nasal cannula -08/06/24 VBG 7.32/77/43/39 -Secondary COPD exacerbation and pneumonia -Wean oxygen  for saturation greater 92%   COPD exacerbation -Started Pulmicort  -Started Brovana  -added Yupelri  -restarted solumedrol>>d/c home with prednisone  taper -viral resp panel +rhinovirus/enterovirus -COVID/flu/RSV--neg -improved   diabetes mellitus type 2 with hyperglycemia -08/06/24 hemoglobin A1c 7.0 -Increased Lantus  35 units during the hospitalization -Increase NovoLog  14 units with meals during hospitalization -Continue sliding scale -hold glipizide  and metformin --restart after d/c -d/c home with Lantus  pen and ISS -pt states she no longer wants to take Ozempic   Pleuritic chest pain -Troponins 3>>4 -chronic   Tobacco  abuse -cessation discussed   Migraine Headache -IV benadryl  x 1 and toradol  IV           Consultants: none Procedures performed: none  Disposition: Home Diet recommendation:  Carb modified diet DISCHARGE MEDICATION: Allergies as of 08/11/2024       Reactions   Levofloxacin  Anaphylaxis   Arm also turned red where IV was place   Penicillins Other (See Comments)   convulsions   Doxycycline  Nausea And Vomiting        Medication List     STOP taking these medications    Ozempic (1 MG/DOSE) 4 MG/3ML Sopn Generic drug: Semaglutide (1 MG/DOSE)       TAKE these medications    acetaminophen  500 MG tablet Commonly known as: TYLENOL  Take 500 mg by mouth every 6 (six) hours as needed for fever or mild pain.   albuterol  (2.5 MG/3ML) 0.083% nebulizer solution Commonly known as: PROVENTIL  Take 3 mLs (2.5 mg total) by nebulization every 6 (six) hours as needed for wheezing or shortness of breath.   albuterol  108 (90 Base) MCG/ACT inhaler Commonly known as: VENTOLIN  HFA Inhale 2 puffs into the lungs every 4 (four) hours as needed for wheezing or shortness of breath.   glipiZIDE  10 MG tablet Commonly known as: GLUCOTROL  Take 20 mg by mouth daily before breakfast.   guaiFENesin -dextromethorphan  100-10 MG/5ML syrup Commonly known as: ROBITUSSIN DM Take 5 mLs by mouth every 4 (four) hours as needed for cough.   Lantus  SoloStar 100 UNIT/ML Solostar Pen Generic drug: insulin  glargine Inject 30 Units into the skin at bedtime. What changed: how much to take   NovoLOG  FlexPen 100 UNIT/ML FlexPen Generic drug: insulin  aspart 3 TIMES A DAY WITH MEALS SUGAR  70-120--no units, 121-150--3 units, 151-200--4 units;  201-250--7 units, 251-300--11 units; 301-350 15 units; 351-400 20 units   ondansetron  4 MG disintegrating tablet Commonly known as: ZOFRAN -ODT 4mg  ODT q4 hours prn nausea/vomit What changed:  how much to take how to take this when to take this reasons to take  this   oxyCODONE  5 MG immediate release tablet Commonly known as: Oxy IR/ROXICODONE  Take 1 tablet (5 mg total) by mouth every 6 (six) hours as needed for moderate pain (pain score 4-6) or severe pain (pain score 7-10).   pantoprazole  40 MG tablet Commonly known as: PROTONIX  Take 1 tablet (40 mg total) by mouth daily. What changed: when to take this   predniSONE  10 MG tablet Commonly known as: DELTASONE  Take 6 tablets (60 mg total) by mouth daily with breakfast. And decrease by one tablet daily Start taking on: August 12, 2024   rOPINIRole  2 MG tablet Commonly known as: REQUIP  Take 2 mg by mouth 3 (three) times daily.   rosuvastatin  40 MG tablet Commonly known as: CRESTOR  Take 40 mg by mouth daily.   SUMAtriptan  50 MG tablet Commonly known as: IMITREX  Take 50 mg by mouth as needed for migraine or headache.   Symbicort  80-4.5 MCG/ACT inhaler Generic drug: budesonide -formoterol  Inhale 2 puffs into the lungs 2 (two) times daily.        Discharge Exam: Filed Weights   08/06/24 1304 08/06/24 1654  Weight: 66.2 kg 66.3 kg   HEENT:  [place]/AT, No thrush, no icterus CV:  RRR, no rub, no S3, no S4 Lung:  bibasilar rales. No wheeze Abd:  soft/+BS, NT Ext:  No edema, no lymphangitis, no synovitis, no rash   Condition at discharge: stable  The results of significant diagnostics from this hospitalization (including imaging, microbiology, ancillary and laboratory) are listed below for reference.   Imaging Studies: CT CHEST LCS NODULE F/U LOW DOSE WO CONTRAST Result Date: 08/10/2024 CLINICAL DATA:  58 year old female current smoker with 46 pack-year smoking history. Follow-up Lung-RADS 4A. EXAM: CT CHEST WITHOUT CONTRAST FOR LUNG CANCER SCREENING NODULE FOLLOW-UP TECHNIQUE: Multidetector CT imaging of the chest was performed following the standard protocol without IV contrast. RADIATION DOSE REDUCTION: This exam was performed according to the departmental dose-optimization  program which includes automated exposure control, adjustment of the mA and/or kV according to patient size and/or use of iterative reconstruction technique. COMPARISON:  CT chest dated 04/14/2024 FINDINGS: Cardiovascular: Normal heart size. No significant pericardial fluid/thickening. Great vessels are normal in course and caliber. Aortic atherosclerosis. Mediastinum/Nodes: Imaged thyroid gland without nodules meeting criteria for imaging follow-up by size. Normal esophagus. Increased 13 mm right hilar lymph node (2:28), previously 10 mm Lungs/Pleura: The central airways are patent. Mild centrilobular and paraseptal emphysema. Mild diffuse bronchiectasis with increased diffuse bronchial wall thickening and extensive new and increasing tree-in-bud nodularity throughout both lungs. Previously noted medial left apical nodule has increased in size to 13.0 mm (4:81), previously 8.9 mm. Adjacent 8.2 mm nodule (4:68) is also increased in size. No pneumothorax. No pleural effusion. Upper abdomen: Normal. Musculoskeletal: No acute or abnormal lytic or blastic osseous lesions. Unchanged mild vertebral body height loss of T7-T9. Mild anterior wedging of T12. IMPRESSION: 1. Lung-RADS 4B, suspicious. Additional imaging evaluation or consultation with Pulmonology or Thoracic Surgery recommended. Interval increase in size of left apical nodules measuring up to 13.0 mm. 2. Increased diffuse bronchial wall thickening and extensive new and increasing tree-in-bud nodularity throughout both lungs, consistent with worsening infectious/inflammatory bronchiolitis. 3. Increased size of right hilar lymph node, indeterminate, but likely reactive.  4. Aortic Atherosclerosis (ICD10-I70.0) and Emphysema (ICD10-J43.9). Electronically Signed   By: Limin  Xu M.D.   On: 08/10/2024 15:25   DG Chest Port 1 View Result Date: 08/06/2024 EXAM: 1 VIEW XRAY OF THE CHEST 08/06/2024 01:48:00 PM COMPARISON: 02/20/2024 CLINICAL HISTORY: Pt comes by EMS for  SOB and weakness. Pt has been unwell for the past 3-4 days. Pt has a productive cough that looks like pus. Pt wears 3L [place]. Pt has hx of copd, abd.hernia and diabetes. FINDINGS: LUNGS AND PLEURA: Diffuse interstitial prominence which corresponds to previously characterized chronic interstitial changes possibly related to smoking related respiratory bronchiolitis. No superimposed pleural effusion, air space consolidation or pneumothorax. HEART AND MEDIASTINUM: No acute abnormality of the cardiac and mediastinal silhouettes. BONES AND SOFT TISSUES: No acute osseous abnormality. IMPRESSION: 1. No acute process. 2. Diffuse interstitial prominence, possibly related to smoking-related respiratory bronchiolitis. Electronically signed by: Waddell Calk MD 08/06/2024 02:32 PM EDT RP Workstation: [id number]    Microbiology: Results for orders placed or performed during the hospital encounter of 08/06/24  Resp panel by RT-PCR (RSV, Flu A&B, Covid) Anterior Nasal Swab     Status: None   Collection Time: 08/06/24  1:09 PM   Specimen: Anterior Nasal Swab  Result Value Ref Range Status   SARS Coronavirus 2 by RT PCR NEGATIVE NEGATIVE Final    Comment: (NOTE) SARS-CoV-2 target nucleic acids are NOT DETECTED.  The SARS-CoV-2 RNA is generally detectable in upper respiratory specimens during the acute phase of infection. The lowest concentration of SARS-CoV-2 viral copies this assay can detect is 138 copies/mL. A negative result does not preclude SARS-Cov-2 infection and should not be used as the sole basis for treatment or other patient management decisions. A negative result may occur with  improper specimen collection/handling, submission of specimen other than nasopharyngeal swab, presence of viral mutation(s) within the areas targeted by this assay, and inadequate number of viral copies(<138 copies/mL). A negative result must be combined with clinical observations, patient history, and  epidemiological information. The expected result is Negative.  Fact Sheet for Patients:  BloggerCourse.com  Fact Sheet for Healthcare Providers:  SeriousBroker.it  This test is no t yet approved or cleared by the [place]  FDA and  has been authorized for detection and/or diagnosis of SARS-CoV-2 by FDA under an Emergency Use Authorization (EUA). This EUA will remain  in effect (meaning this test can be used) for the duration of the COVID-19 declaration under Section 564(b)(1) of the Act, 21 U.S.C.section 360bbb-3(b)(1), unless the authorization is terminated  or revoked sooner.       Influenza A by PCR NEGATIVE NEGATIVE Final   Influenza B by PCR NEGATIVE NEGATIVE Final    Comment: (NOTE) The Xpert Xpress SARS-CoV-2/FLU/RSV plus assay is intended as an aid in the diagnosis of influenza from Nasopharyngeal swab specimens and should not be used as a sole basis for treatment. Nasal washings and aspirates are unacceptable for Xpert Xpress SARS-CoV-2/FLU/RSV testing.  Fact Sheet for Patients: BloggerCourse.com  Fact Sheet for Healthcare Providers: SeriousBroker.it  This test is not yet approved or cleared by the [place]  FDA and has been authorized for detection and/or diagnosis of SARS-CoV-2 by FDA under an Emergency Use Authorization (EUA). This EUA will remain in effect (meaning this test can be used) for the duration of the COVID-19 declaration under Section 564(b)(1) of the Act, 21 U.S.C. section 360bbb-3(b)(1), unless the authorization is terminated or revoked.     Resp Syncytial Virus by PCR NEGATIVE NEGATIVE Final  Comment: (NOTE) Fact Sheet for Patients: BloggerCourse.com  Fact Sheet for Healthcare Providers: SeriousBroker.it  This test is not yet approved or cleared by the [place]  FDA and has been  authorized for detection and/or diagnosis of SARS-CoV-2 by FDA under an Emergency Use Authorization (EUA). This EUA will remain in effect (meaning this test can be used) for the duration of the COVID-19 declaration under Section 564(b)(1) of the Act, 21 U.S.C. section 360bbb-3(b)(1), unless the authorization is terminated or revoked.  Performed at [facility], [address], [place], [place] [address]   Culture, blood (routine x 2)     Status: None   Collection Time: 08/06/24  1:50 PM   Specimen: BLOOD  Result Value Ref Range Status   Specimen Description BLOOD LEFT ANTECUBITAL  Final   Special Requests   Final    BOTTLES DRAWN AEROBIC AND ANAEROBIC Blood Culture adequate volume   Culture   Final    NO GROWTH 5 DAYS Performed at [facility], [address], [place], [place] [address]    Report Status 08/11/2024 FINAL  Final  Culture, blood (routine x 2)     Status: None   Collection Time: 08/06/24  1:50 PM   Specimen: BLOOD  Result Value Ref Range Status   Specimen Description BLOOD RIGHT ANTECUBITAL  Final   Special Requests   Final    BOTTLES DRAWN AEROBIC AND ANAEROBIC Blood Culture adequate volume   Culture   Final    NO GROWTH 5 DAYS Performed at [facility], [address] [place], [place] [address]    Report Status 08/11/2024 FINAL  Final  Respiratory (~20 pathogens) panel by PCR     Status: Abnormal   Collection Time: 08/06/24  4:26 PM   Specimen: Nasopharyngeal Swab; Respiratory  Result Value Ref Range Status   Adenovirus NOT DETECTED NOT DETECTED Final   Coronavirus 229E NOT DETECTED NOT DETECTED Final    Comment: (NOTE) The Coronavirus on the Respiratory Panel, DOES NOT test for the novel  Coronavirus (2019 nCoV)    Coronavirus HKU1 NOT DETECTED NOT DETECTED Final   Coronavirus NL63 NOT DETECTED NOT DETECTED Final   Coronavirus OC43 NOT DETECTED NOT DETECTED Final   Metapneumovirus NOT DETECTED NOT DETECTED Final   Rhinovirus / Enterovirus DETECTED (A) NOT  DETECTED Final   Influenza A NOT DETECTED NOT DETECTED Final   Influenza B NOT DETECTED NOT DETECTED Final   Parainfluenza Virus 1 NOT DETECTED NOT DETECTED Final   Parainfluenza Virus 2 NOT DETECTED NOT DETECTED Final   Parainfluenza Virus 3 NOT DETECTED NOT DETECTED Final   Parainfluenza Virus 4 NOT DETECTED NOT DETECTED Final   Respiratory Syncytial Virus NOT DETECTED NOT DETECTED Final   Bordetella pertussis NOT DETECTED NOT DETECTED Final   Bordetella Parapertussis NOT DETECTED NOT DETECTED Final   Chlamydophila pneumoniae NOT DETECTED NOT DETECTED Final   Mycoplasma pneumoniae NOT DETECTED NOT DETECTED Final    Comment: Performed at [facility], [address], [place], [place] [address]    Labs: CBC: Recent Labs  Lab 08/06/24 1350 08/07/24 0418  WBC 13.8* 9.4  NEUTROABS 11.4*  --   HGB 12.8 12.2  HCT 41.4 40.5  MCV 87.3 90.4  PLT 303 326   Basic Metabolic Panel: Recent Labs  Lab 08/06/24 1350 08/07/24 0418 08/10/24 0440  NA 128* 138 134*  K 3.6 4.6 4.1  CL 88* 95* 90*  CO2 30 34* 33*  GLUCOSE 227* 243* 299*  BUN 8 8  19  CREATININE 0.33* 0.40* 0.59  CALCIUM  8.1* 8.5* 9.1  MG  --   --  1.8  PHOS  --   --  4.5   Liver Function Tests: Recent Labs  Lab 08/06/24 1350  AST 15  ALT 12  ALKPHOS 93  BILITOT 0.5  PROT 7.7  ALBUMIN 3.7   CBG: Recent Labs  Lab 08/10/24 1631 08/10/24 2116 08/11/24 0323 08/11/24 0745 08/11/24 1109  GLUCAP 147* 96 285* 260* 204*    Discharge time spent: greater than 30 minutes.  Signed: Alm Schneider, MD [facility] 08/11/2024

## 2024-08-14 NOTE — Telephone Encounter (Signed)
 Spoke with patient and reviewed recent Lung CT results. She is scheduled for a PET on 08/21/2024 and office f/u appt on 08/29/2025 to review results. Results and plan sent to PCP. No further needs.

## 2024-08-21 ENCOUNTER — Telehealth: Payer: Self-pay | Admitting: Acute Care

## 2024-08-21 ENCOUNTER — Encounter (HOSPITAL_COMMUNITY)
Admission: RE | Admit: 2024-08-21 | Discharge: 2024-08-21 | Disposition: A | Discharge status: Home / Self Care | Source: Ambulatory Visit | Attending: Acute Care | Admitting: Acute Care

## 2024-08-21 DIAGNOSIS — R911 Solitary pulmonary nodule: Secondary | ICD-10-CM | POA: Insufficient documentation

## 2024-08-21 LAB — GLUCOSE, CAPILLARY: Glucose-Capillary: 318 mg/dL — ABNORMAL HIGH (ref 70–99)

## 2024-08-21 MED ORDER — FLUDEOXYGLUCOSE F - 18 (FDG) INJECTION: 7.3000 | Freq: Once | INTRAVENOUS | Status: DC | PRN

## 2024-08-21 NOTE — Telephone Encounter (Signed)
 Copied from CRM [id number]. Topic: Appointments - Scheduling Inquiry for Clinic >> Aug 21, 2024  1:26 PM Joesph PARAS wrote: Reason for CRM: Patient is calling to reschedule appointment with NP Groce to a different day, as her PET scan just got moved. PAS not permitted to schedule for NP Groce at this time. Please call patient back to reschedule.  ATC patient, no one answered so I left a voicemail for the patient to give us  a call back.

## 2024-08-22 NOTE — Telephone Encounter (Signed)
 Patient scheduled.

## 2024-08-28 ENCOUNTER — Encounter (HOSPITAL_COMMUNITY)
Admission: RE | Admit: 2024-08-28 | Discharge: 2024-08-28 | Disposition: A | Discharge status: Home / Self Care | Source: Ambulatory Visit | Attending: Acute Care | Admitting: Acute Care

## 2024-08-28 DIAGNOSIS — R911 Solitary pulmonary nodule: Secondary | ICD-10-CM | POA: Insufficient documentation

## 2024-08-28 LAB — GLUCOSE, CAPILLARY: Glucose-Capillary: 163 mg/dL — ABNORMAL HIGH (ref 70–99)

## 2024-08-28 MED ORDER — FLUDEOXYGLUCOSE F - 18 (FDG) INJECTION
7.2300 | Freq: Once | INTRAVENOUS | Status: AC
  Administered 2024-08-28: 7.23 via INTRAVENOUS

## 2024-08-29 ENCOUNTER — Ambulatory Visit: Admitting: Acute Care

## 2024-09-05 NOTE — Patient Instructions (Signed)

## 2024-09-05 NOTE — Progress Notes (Unsigned)
 PROVIDER NOTE: Interpretation of information contained herein should be left to medically-trained personnel. Specific patient instructions are provided elsewhere under Patient Instructions section of medical record. This document was created in part using AI and STT-dictation technology, any transcriptional errors that may result from this process are unintentional.  Patient: [name]  Service: E/M Encounter  Provider: Eric DELENA Como, MD  DOB: 08-31-1966  Delivery: Face-to-face  Specialty: Interventional Pain Management  MRN: [id number]  Setting: Ambulatory outpatient facility  Specialty designation: 09  Type: New Patient  Location: Outpatient office facility  PCP: Vick Lurie, FNP (Inactive)  DOS: 09/06/2024    Referring Prov.: Vick Lurie, FNP   Primary Reason(s) for Visit: Encounter for initial evaluation of one or more chronic problems (new to examiner) potentially causing chronic pain, and posing a threat to normal musculoskeletal function. (Level of risk: High) CC: No chief complaint on file.  HPI  [name] is a 58 y.o. year old, female patient, who comes for the first time to our practice referred by Vick Lurie, FNP for our initial evaluation of her chronic pain. She has Cigarette smoker; 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 ; ADULT RESPIRATORY DISTRESS SYNDROME; DELIRIUM; Oral candidiasis; DM (diabetes mellitus) (HCC); Candida infection; GERD (gastroesophageal reflux disease); COPD exacerbation (HCC); Community acquired pneumonia; COPD with acute exacerbation (HCC); Sepsis (HCC); Anxiety state; Essential hypertension; Atypical pneumonia - SEPSIS RULED OUT; Chronic obstructive pulmonary disease (HCC); Chronic pain syndrome; Lobar pneumonia; Chronic respiratory failure with hypoxia (HCC); Sepsis due to undetermined organism (HCC); ERRONEOUS ENCOUNTER--DISREGARD; Acute on chronic respiratory failure (HCC); Hyperlipidemia; Hypokalemia; Hyponatremia; Atypical chest  pain; Choledocholithiasis with acute cholecystitis; Cholecystitis; Acute pyelonephritis; Choledocholithiasis; Polyneuropathy due to type 2 diabetes mellitus (HCC); Oxygen  dependent; Neck pain; Migraine; Electrocardiogram abnormal; CAP (community acquired pneumonia); Encounter for smoking cessation counseling; Tobacco abuse; Chronic respiratory failure with hypoxia and hypercapnia (HCC); and Uncontrolled type 2 diabetes mellitus with hyperglycemia, with long-term current use of insulin  (HCC) on their problem list. Today she comes in for evaluation of her No chief complaint on file.  Pain Assessment: Location:     Radiating:   Onset:   Duration:   Quality:   Severity:  /10 (subjective, self-reported pain score)  Effect on ADL:   Timing:   Modifying factors:   BP:    HR:    Onset and Duration: {Hx; Onset and Duration:210120511} Cause of pain: {Hx; Cause:210120521} Severity: {Pain Severity:210120502} Timing: {Symptoms; Timing:210120501} Aggravating Factors: {Causes; Aggravating pain factors:210120507} Alleviating Factors: {Causes; Alleviating Factors:210120500} Associated Problems: {Hx; Associated problems:210120515} Quality of Pain: {Hx; Symptom quality or Descriptor:210120531} Previous Examinations or Tests: {Hx; Previous examinations or test:210120529} Previous Treatments: {Hx; Previous Treatment:210120503}  [name] is being evaluated for possible interventional pain management therapies for the treatment of her chronic pain.  Discussed the use of AI scribe software for clinical note transcription with the patient, who gave verbal consent to proceed.  History of Present Illness            [name] has been informed that this initial visit was an evaluation only.  On the follow up appointment I will go over the results, including ordered tests and available interventional therapies. At that time she will have the opportunity to decide whether to proceed with offered therapies or  not. In the event that [name] prefers avoiding interventional options, this will conclude our involvement in the case.  Medication management recommendations may be provided upon request.  Patient informed that diagnostic tests may be ordered to assist  in identifying underlying causes, narrow the list of differential diagnoses and aid in determining candidacy for (or contraindications to) planned therapeutic interventions.  Historic Controlled Substance Pharmacotherapy Review PMP and historical list of controlled substances: ***  Most recently prescribed controlled substance(s): Opioid Analgesic: *** MME/day: *** mg/day  Historical Monitoring: The patient  reports no history of drug use. List of prior UDS Testing: Lab Results  Component Value Date   COCAINSCRNUR NONE DETECTED 12/17/2017   COCAINSCRNUR NEGATIVE 02/26/2011   COCAINSCRNUR NONE DETECTED 05/01/2010   COCAINSCRNUR NONE DETECTED 11/26/2009   PCPSCRNUR NEGATIVE 02/26/2011   THCU POSITIVE (A) 12/17/2017   THCU POSITIVE (A) 05/01/2010   THCU POSITIVE (A) 11/26/2009   ETH  05/01/2010    <5        LOWEST DETECTABLE LIMIT FOR SERUM ALCOHOL IS 5 mg/dL FOR MEDICAL PURPOSES ONLY   Historical Background Evaluation: [place] PMP: PDMP reviewed during this encounter. Review of the past 51-months conducted.             PMP NARX Score Report:  Narcotic: 230 Sedative: 190 Stimulant: 000 [place] Department of public safety, offender search: Engineer, mining Information) Non-contributory Risk Assessment Profile: Aberrant behavior: None observed or detected today Risk factors for fatal opioid overdose: None identified today PMP NARX Overdose Risk Score: 430 Fatal overdose hazard ratio (HR): Calculation deferred Non-fatal overdose hazard ratio (HR): Calculation deferred Risk of opioid abuse or dependence: 0.7-3.0% with doses <= 36 MME/day and 6.1-26% with doses >= 120 MME/day. Substance use disorder (SUD) risk level: See below Personal History of  Substance Abuse (SUD-Substance use disorder):  Alcohol:    Illegal Drugs:    Rx Drugs:    ORT Risk Level calculation:    ORT Scoring interpretation table:  Score <3 = Low Risk for SUD  Score between 4-7 = Moderate Risk for SUD  Score >8 = High Risk for Opioid Abuse   PHQ-2 Depression Scale:  Total score:    PHQ-2 Scoring interpretation table: (Score and probability of major depressive disorder)  Score 0 = No depression  Score 1 = 15.4% Probability  Score 2 = 21.1% Probability  Score 3 = 38.4% Probability  Score 4 = 45.5% Probability  Score 5 = 56.4% Probability  Score 6 = 78.6% Probability   PHQ-9 Depression Scale:  Total score:    PHQ-9 Scoring interpretation table:  Score 0-4 = No depression  Score 5-9 = Mild depression  Score 10-14 = Moderate depression  Score 15-19 = Moderately severe depression  Score 20-27 = Severe depression (2.4 times higher risk of SUD and 2.89 times higher risk of overuse)   Pharmacologic Plan: As per protocol, I have not taken over any controlled substance management, pending the results of ordered tests and/or consults.            Initial impression: Pending review of available data and ordered tests.  Meds   Current Outpatient Medications:    acetaminophen  (TYLENOL ) 500 MG tablet, Take 500 mg by mouth every 6 (six) hours as needed for fever or mild pain., Disp: , Rfl:    albuterol  (PROVENTIL ) (2.5 MG/3ML) 0.083% nebulizer solution, Take 3 mLs (2.5 mg total) by nebulization every 6 (six) hours as needed for wheezing or shortness of breath., Disp: 75 mL, Rfl: 3   albuterol  (VENTOLIN  HFA) 108 (90 Base) MCG/ACT inhaler, Inhale 2 puffs into the lungs every 4 (four) hours as needed for wheezing or shortness of breath., Disp: 18 g, Rfl: 1   glipiZIDE  (GLUCOTROL ) 10 MG  tablet, Take 20 mg by mouth daily before breakfast., Disp: , Rfl:    guaiFENesin -dextromethorphan  (ROBITUSSIN DM) 100-10 MG/5ML syrup, Take 5 mLs by mouth every 4 (four) hours as needed  for cough., Disp: 118 mL, Rfl: 0   insulin  aspart (NOVOLOG  FLEXPEN) 100 UNIT/ML FlexPen, 3 TIMES A DAY WITH MEALS SUGAR  70-120--no units, 121-150--3 units, 151-200--4 units; 201-250--7 units, 251-300--11 units; 301-350 15 units; 351-400 20 units, Disp: 15 mL, Rfl: 0   LANTUS  SOLOSTAR 100 UNIT/ML Solostar Pen, Inject 30 Units into the skin at bedtime., Disp: 15 mL, Rfl: 0   ondansetron  (ZOFRAN -ODT) 4 MG disintegrating tablet, 4mg  ODT q4 hours prn nausea/vomit (Patient taking differently: Take 4 mg by mouth every 4 (four) hours as needed for nausea or vomiting. 4mg  ODT q4 hours prn nausea/vomit), Disp: 10 tablet, Rfl: 0   oxyCODONE  (OXY IR/ROXICODONE ) 5 MG immediate release tablet, Take 1 tablet (5 mg total) by mouth every 6 (six) hours as needed for moderate pain (pain score 4-6) or severe pain (pain score 7-10)., Disp: 12 tablet, Rfl: 0   pantoprazole  (PROTONIX ) 40 MG tablet, Take 1 tablet (40 mg total) by mouth daily. (Patient taking differently: Take 40 mg by mouth every other day.), Disp: 30 tablet, Rfl: 3   predniSONE  (DELTASONE ) 10 MG tablet, Take 6 tablets (60 mg total) by mouth daily with breakfast. And decrease by one tablet daily, Disp: 21 tablet, Rfl: 0   rOPINIRole  (REQUIP ) 2 MG tablet, Take 2 mg by mouth 3 (three) times daily., Disp: , Rfl:    rosuvastatin  (CRESTOR ) 40 MG tablet, Take 40 mg by mouth daily., Disp: , Rfl:    SUMAtriptan  (IMITREX ) 50 MG tablet, Take 50 mg by mouth as needed for migraine or headache., Disp: , Rfl:    SYMBICORT  80-4.5 MCG/ACT inhaler, Inhale 2 puffs into the lungs 2 (two) times daily., Disp: , Rfl:   Imaging Review  Cervical Imaging: Cervical CT wo contrast: Results for orders placed during the hospital encounter of 03/30/22 CT Cervical Spine Wo Contrast  Narrative CLINICAL DATA:  Neck trauma, dangerous injury mechanism (Age 46-64y)  EXAM: CT CERVICAL SPINE WITHOUT CONTRAST  TECHNIQUE: Multidetector CT imaging of the cervical spine was performed  without intravenous contrast. Multiplanar CT image reconstructions were also generated.  RADIATION DOSE REDUCTION: This exam was performed according to the departmental dose-optimization program which includes automated exposure control, adjustment of the mA and/or kV according to patient size and/or use of iterative reconstruction technique.  COMPARISON:  None Available.  FINDINGS: Alignment: Facet joints are aligned without dislocation or traumatic listhesis. Dens and lateral masses are aligned. Straightening with slight reversal of the cervical lordosis.  Skull base and vertebrae: No acute fracture. No primary bone lesion or focal pathologic process. 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  Soft tissues and spinal canal: No prevertebral fluid or swelling. No visible canal hematoma.  Disc levels: Degenerative disc disease most pronounced at C5-6 and C6-7. Facet arthropathy more pronounced on the right.  Upper chest: Emphysema and biapical pleuroparenchymal scarring.  Other: Bilateral carotid atherosclerosis.  IMPRESSION: 1. No acute fracture or traumatic listhesis of the cervical spine. 2. Degenerative disc disease most pronounced at C5-6 and C6-7.  Emphysema (ICD10-J43.9).   Electronically Signed By: Mabel Converse D.O. On: 03/30/2022 19:04  Shoulder Imaging: Shoulder-R DG: Results for orders placed in visit on 01/17/24 DG Shoulder Right  Narrative Right shoulder x-ray right shoulder pain worse over 2 years history of OA right shoulder  X-ray shows progressive joint space narrowing and increased osteophyte medial humerus  Curved acromion no spur  Impression OA right shoulder moderate  Lumbosacral Imaging: Lumbar MR wo contrast: Results for orders placed during the hospital encounter of 01/27/21 MR LUMBAR SPINE WO CONTRAST  Narrative CLINICAL DATA:  Low back pain with right-sided radiculopathy  EXAM: MRI LUMBAR SPINE WITHOUT  CONTRAST  TECHNIQUE: Multiplanar, multisequence MR imaging of the lumbar spine was performed. No intravenous contrast was administered.  COMPARISON:  MRI 01/10/2019  FINDINGS: Segmentation:  Standard.  Alignment: Straightening of the lumbar lordosis without static listhesis.  Vertebrae:  No fracture, evidence of discitis, or bone lesion.  Conus medullaris and cauda equina: Conus extends to the T12-L1 level. Conus and cauda equina appear normal.  Paraspinal and other soft tissues: Negative.  Disc levels:  T12-L1: No significant disc protrusion, foraminal stenosis, or canal stenosis.  L1-L2: No significant disc protrusion, foraminal stenosis, or canal stenosis.  L2-L3: No significant disc protrusion, foraminal stenosis, or canal stenosis.  L3-L4: No significant disc protrusion, foraminal stenosis, or canal stenosis.  L4-L5: Moderate-sized central disc protrusion resulting in severe right and moderate left subarticular recess stenosis which is similar to slightly increased in size compared to prior. No significant canal or foraminal stenosis.  L5-S1: Moderate-large right paracentral disc extrusion resulting in severe right subarticular recess stenosis and mild left subarticular recess stenosis. No foraminal or canal stenosis. Findings progressed from prior.  IMPRESSION: 1. Moderate-large right paracentral disc extrusion at L5-S1 resulting in severe right subarticular recess stenosis and mild left subarticular recess stenosis. Findings have progressed from prior. 2. Moderate-sized central disc protrusion at L4-L5 resulting in severe right and moderate left subarticular recess stenosis which is similar to slightly increased in size compared to prior. 3. No canal stenosis at any level.   Electronically Signed By: Mabel Converse D.O. On: 03/03/2021 16:02  Lumbar CT wo contrast: Results for orders placed during the hospital encounter of 03/30/22 CT Lumbar Spine  Wo Contrast  Narrative CLINICAL DATA:  Back trauma, no prior imaging (Age >= 16y)  EXAM: CT LUMBAR SPINE WITHOUT CONTRAST  TECHNIQUE: Multidetector CT imaging of the lumbar spine was performed without intravenous contrast administration. Multiplanar CT image reconstructions were also generated.  RADIATION DOSE REDUCTION: This exam was performed according to the departmental dose-optimization program which includes automated exposure control, adjustment of the mA and/or kV according to patient size and/or use of iterative reconstruction technique.  COMPARISON:  03/02/2021  FINDINGS: Segmentation: 5 lumbar type vertebrae.  Alignment: Normal.  Vertebrae: No acute fracture or focal pathologic process.  Paraspinal and other soft tissues: Negative.  Disc levels: The T12-L1 through L3-L4 levels appear unremarkable by CT without evidence to suggest foraminal or canal stenosis.  Diffuse disc bulge with central protrusion at L4-5 with mild ligamentum flavum thickening results in bilateral subarticular recess narrowing and possibly mild canal stenosis. Bilateral foramina appear patent.  Prominent right paracentral disc protrusion at L5-S1 with bilateral subarticular recess narrowing. No evidence of canal stenosis. Suspect mild right foraminal stenosis.  IMPRESSION: 1. No acute fracture or traumatic listhesis of the lumbar spine. 2. Degenerative disc disease of the L4-5 and L5-S1 levels, as above. Appearance is similar to the previous MRI, when accounting for differences in technique.   Electronically Signed By: Mabel Converse D.O. On: 03/30/2022 19:08  Lumbar CT w contrast: Results for orders placed during the hospital encounter of 02/04/11 CT Lumbar Spine W Contrast  Narrative *RADIOLOGY REPORT*  MYELOGRAM INJECTION  Technique:  Informed consent was obtained from the patient prior to the procedure, including potential complications of headache, allergy, infection  and pain. Specific instructions were given regarding 24 hour bedrest post procedure to prevent post-LP headache.  A timeout procedure was performed.  With the patient prone, the lower back was prepped with Betadine.  1% Lidocaine  was used for local anesthesia.  Lumbar puncture was performed by the radiologist at the L3-L4 level using a 22 gauge needle with return of clear CSF.  15 cc of Omnipaque  180 was injected into the subarachnoid space .  IMPRESSION: Successful injection of  intrathecal contrast for myelography.  MYELOGRAM LUMBAR  Clinical Data:  Low back pain.  Left leg pain.  Comparison: MRI lumbar spine 08/21/2010  Findings: Good opacification lumbar subarachnoid space.  Mild effacement both L5 nerve roots with a shallow ventral defect at L4- L5.  With the patient upright, the protrusion increases significantly in size.  There is significant increase in the bilateral L5 nerve root encroachment and stenosis on the upright AP view.  Fluoroscopy Time: 0.34 minutes  IMPRESSION: As above  CT MYELOGRAPHY LUMBAR SPINE  Technique: Multidetector CT imaging of the lumbar spine was performed following myelography.  Multiplanar CT image reconstructions were also generated.  Findings:  No prevertebral or paraspinous masses. Conus normal.  L1-2: Normal.  L2-3: Normal.  L3-4: Normal.  L4-5: Central protrusion is slightly more eccentric to the right than the left.  There is mild bilateral facet arthropathy.  Central canal stenosis is accompanied by right greater than left L5 nerve root encroachment.  No significant neural foraminal narrowing.  L5-S1: Normal.  Compared with prior MRI the appearance is fairly similar.  IMPRESSION: Central disc protrusion at L4-5 is much more apparent with the patient upright.  There is mild bilateral facet arthropathy at this level.  Note is made of the fact that the patient's left leg symptoms do not completely correlate with the  imaging appearance which shows a right greater than left central protrusion.  Original Report Authenticated By: NORLEEN IVAR CATCHING, M.D.  Lumbar DG (Complete) 4+V: Results for orders placed during the hospital encounter of 04/12/22 DG Lumbar Spine Complete  Narrative CLINICAL DATA:  Trauma, fall  EXAM: LUMBAR SPINE - COMPLETE 4+ VIEW  COMPARISON:  09/09/2010  FINDINGS: No recent fracture is seen in the lumbar spine. There is mild decrease in height of upper endplate of body of T12 vertebra. This finding was not seen in the previous study. Alignment of posterior margins of vertebral bodies is unremarkable. Degenerative changes are noted with disc space narrowing and bony spurs at L5-S1 level. Paraspinal soft tissues are unremarkable.  IMPRESSION: There is mild 10-20% decrease in height of upper endplate of body of T12 vertebra which may suggest recent or old compression fracture. Lumbar spondylosis at L5-S1 level.   Electronically Signed By: Gearldine Mary M.D. On: 04/12/2022 17:01  Lumbar DG Myelogram views: Results for orders placed during the hospital encounter of 02/04/11 DG Myelogram Lumbar  Narrative *RADIOLOGY REPORT*  MYELOGRAM INJECTION  Technique:  Informed consent was obtained from the patient prior to the procedure, including potential complications of headache, allergy, infection and pain. Specific instructions were given regarding 24 hour bedrest post procedure to prevent post-LP headache.  A timeout procedure was performed.  With the patient prone, the lower back was prepped with Betadine.  1% Lidocaine  was used for local anesthesia.  Lumbar puncture was performed by the radiologist at the L3-L4 level using a 22 gauge needle with  return of clear CSF.  15 cc of Omnipaque  180 was injected into the subarachnoid space .  IMPRESSION: Successful injection of  intrathecal contrast for myelography.  MYELOGRAM LUMBAR  Clinical Data:  Low back pain.   Left leg pain.  Comparison: MRI lumbar spine 08/21/2010  Findings: Good opacification lumbar subarachnoid space.  Mild effacement both L5 nerve roots with a shallow ventral defect at L4- L5.  With the patient upright, the protrusion increases significantly in size.  There is significant increase in the bilateral L5 nerve root encroachment and stenosis on the upright AP view.  Fluoroscopy Time: 0.34 minutes  IMPRESSION: As above  CT MYELOGRAPHY LUMBAR SPINE  Technique: Multidetector CT imaging of the lumbar spine was performed following myelography.  Multiplanar CT image reconstructions were also generated.  Findings:  No prevertebral or paraspinous masses. Conus normal.  L1-2: Normal.  L2-3: Normal.  L3-4: Normal.  L4-5: Central protrusion is slightly more eccentric to the right than the left.  There is mild bilateral facet arthropathy.  Central canal stenosis is accompanied by right greater than left L5 nerve root encroachment.  No significant neural foraminal narrowing.  L5-S1: Normal.  Compared with prior MRI the appearance is fairly similar.  IMPRESSION: Central disc protrusion at L4-5 is much more apparent with the patient upright.  There is mild bilateral facet arthropathy at this level.  Note is made of the fact that the patient's left leg symptoms do not completely correlate with the imaging appearance which shows a right greater than left central protrusion.  Original Report Authenticated By: NORLEEN IVAR CATCHING, M.D.  Hip Imaging: Hip-R DG 2-3 views: Results for orders placed during the hospital encounter of 07/12/20 DG HIP UNILAT WITH PELVIS 2-3 VIEWS RIGHT  Narrative CLINICAL DATA:  Posterior right hip pain x 4-6 months.  EXAM: DG HIP (WITH OR WITHOUT PELVIS) 2-3V RIGHT  COMPARISON:  None.  FINDINGS: There is no evidence of hip fracture or dislocation. There is no evidence of arthropathy or other focal bone  abnormality.  IMPRESSION: Negative.   Electronically Signed By: Suzen Dials M.D. On: 07/12/2020 19:57  Hip-L DG 2-3 views: Results for orders placed during the hospital encounter of 03/30/22 DG Hip Unilat W or Wo Pelvis 2-3 Views Left  Narrative CLINICAL DATA:  Left hip pain after fall 2 days ago  EXAM: DG HIP (WITH OR WITHOUT PELVIS) 2-3V LEFT  COMPARISON:  07/12/2020  FINDINGS: There is no evidence of hip fracture or dislocation. Mild osteoarthritis of the left hip. No appreciable soft tissue abnormality.  IMPRESSION: Negative.   Electronically Signed By: Mabel Converse D.O. On: 03/30/2022 18:42  Knee Imaging: Knee-R DG 4 views: Results for orders placed during the hospital encounter of 07/12/20 DG Knee Complete 4 Views Right  Narrative CLINICAL DATA:  Right posterior patellar knee pain for several months.  EXAM: RIGHT KNEE - COMPLETE 4+ VIEW  COMPARISON:  None.  FINDINGS: No evidence of an acute fracture, dislocation, or joint effusion. No evidence of arthropathy. Multiple thin linear sclerotic areas are seen within the medullary portions of the distal right femoral shaft and proximal right tibia. Soft tissues are unremarkable.  IMPRESSION: 1. No acute osseous abnormality. 2. Multiple thin linear sclerotic areas within the distal right femur and proximal right tibia, which may represent bone infarcts.   Electronically Signed By: Suzen Dials M.D. On: 07/12/2020 20:03  Knee-L DG 4 views: Results for orders placed during the hospital encounter of 02/03/12 DG Knee Complete 4 Views Left  Narrative *RADIOLOGY REPORT*  Clinical Data: Left knee pain.  LEFT KNEE - COMPLETE 4+ VIEW  Comparison: None.  Findings: Osseous structures are normal.  Suggestion of a tiny joint effusion.  IMPRESSION: Tiny joint effusion.  Original Report Authenticated By: LYNWOOD HILARIO HUGGER, M.D.  Complexity Note: Imaging results reviewed.                          ROS  Cardiovascular: {Hx; Cardiovascular History:210120525} Pulmonary or Respiratory: {Hx; Pumonary and/or Respiratory History:210120523} Neurological: {Hx; Neurological:210120504} Psychological-Psychiatric: {Hx; Psychological-Psychiatric History:210120512} Gastrointestinal: {Hx; Gastrointestinal:210120527} Genitourinary: {Hx; Genitourinary:210120506} Hematological: {Hx; Hematological:210120510} Endocrine: {Hx; Endocrine history:210120509} Rheumatologic: {Hx; Rheumatological:210120530} Musculoskeletal: {Hx; Musculoskeletal:210120528} Work History: {Hx; Work history:210120514}  Allergies  [name] is allergic to levofloxacin , penicillins, and doxycycline .  Laboratory Chemistry Profile   Renal Lab Results  Component Value Date   BUN 19 08/10/2024   CREATININE 0.59 08/10/2024   GFRAA >60 04/21/2020   GFRNONAA >60 08/10/2024   PROTEINUR 100 (A) 02/20/2024     Electrolytes Lab Results  Component Value Date   NA 134 (L) 08/10/2024   K 4.1 08/10/2024   CL 90 (L) 08/10/2024   CALCIUM  9.1 08/10/2024   MG 1.8 08/10/2024   PHOS 4.5 08/10/2024     Hepatic Lab Results  Component Value Date   AST 15 08/06/2024   ALT 12 08/06/2024   ALBUMIN 3.7 08/06/2024   ALKPHOS 93 08/06/2024   LIPASE 33 06/26/2024   AMMONIA 140 SLIGHT HEMOLYSIS (H) 01/14/2010     ID Lab Results  Component Value Date   HIV Non Reactive 08/07/2024   SARSCOV2NAA NEGATIVE 08/06/2024   STAPHAUREUS  02/23/2011    NEGATIVE        The Xpert SA Assay (FDA approved for NASAL specimens only), is one component of a comprehensive surveillance program.  It is not intended to diagnose infection nor to guide or monitor treatment.   MRSAPCR NEGATIVE 10/15/2015   PREGTESTUR NEGATIVE 04/19/2022     Bone No results found for: VD25OH, CI874NY7UNU, CI6874NY7, CI7874NY7, 25OHVITD1, 25OHVITD2, 25OHVITD3, TESTOFREE, TESTOSTERONE   Endocrine Lab Results  Component Value Date   GLUCOSE  299 (H) 08/10/2024   GLUCOSEU 50 (A) 02/20/2024   HGBA1C 7.0 (H) 08/06/2024   TSH 0.229 (L) 02/21/2017     Neuropathy Lab Results  Component Value Date   HGBA1C 7.0 (H) 08/06/2024   HIV Non Reactive 08/07/2024     CNS No results found for: COLORCSF, APPEARCSF, RBCCOUNTCSF, WBCCSF, POLYSCSF, LYMPHSCSF, EOSCSF, PROTEINCSF, GLUCCSF, JCVIRUS, CSFOLI, IGGCSF, LABACHR, ACETBL   Inflammation (CRP: Acute  ESR: Chronic) Lab Results  Component Value Date   CRP 26.3 (H) 11/27/2019   ESRSEDRATE 111 (H) 12/04/2009   LATICACIDVEN 0.9 08/06/2024     Rheumatology Lab Results  Component Value Date   ANA NEGATIVE 11/27/2009     Coagulation Lab Results  Component Value Date   INR 1.1 02/21/2024   LABPROT 14.5 02/21/2024   APTT 24 02/21/2024   PLT 326 08/07/2024   DDIMER 1.57 (H) 08/17/2021     Cardiovascular Lab Results  Component Value Date   BNP 47.0 08/06/2024   CKTOTAL 51 02/26/2011   CKMB 3.5 02/26/2011   TROPONINI <0.03 10/15/2015   HGB 12.2 08/07/2024   HCT 40.5 08/07/2024     Screening Lab Results  Component Value Date   SARSCOV2NAA NEGATIVE 08/06/2024   STAPHAUREUS  02/23/2011    NEGATIVE        The Xpert SA Assay (FDA  approved for NASAL specimens only), is one component of a comprehensive surveillance program.  It is not intended to diagnose infection nor to guide or monitor treatment.   MRSAPCR NEGATIVE 10/15/2015   HIV Non Reactive 08/07/2024   PREGTESTUR NEGATIVE 04/19/2022     Cancer No results found for: CEA, CA125, LABCA2   Allergens No results found for: ALMOND, APPLE, ASPARAGUS, AVOCADO, BANANA, BARLEY, BASIL, BAYLEAF, GREENBEAN, LIMABEAN, WHITEBEAN, BEEFIGE, REDBEET, BLUEBERRY, BROCCOLI, CABBAGE, MELON, CARROT, CASEIN, CASHEWNUT, CAULIFLOWER, CELERY     Note: Lab results reviewed.  PFSH  Drug: [name]  reports no history of drug use. Alcohol:  reports no history  of alcohol use. Tobacco:  reports that she has been smoking cigarettes. She started smoking about 46 years ago. She has a 46.8 pack-year smoking history. She has never used smokeless tobacco. Medical:  has a past medical history of Angina, Anxiety state (10/15/2015), ARDS (adult respiratory distress syndrome) (HCC), Asthma, Chronic back pain, COPD (chronic obstructive pulmonary disease) (HCC), Diabetes mellitus, Encounter for smoking cessation counseling (02/21/2024), Gallstones with biliary obstruction, GERD (gastroesophageal reflux disease) (12/21/2012), HTN (hypertension) (10/15/2015), Hyperglycemia, drug-induced, Migraine headache, On home O2, Pneumonia, Recurrent upper respiratory infection (URI), and Shortness of breath. Family: family history includes Cancer in an other family member; Coronary artery disease in her brother; Diabetes in an other family member; Hypertension in an other family member.  Past Surgical History:  Procedure Laterality Date   BILIARY DILATION  01/04/2024   Procedure: BILIARY DILATION;  Surgeon: Jinny Carmine, MD;  Location: [facility];  Service: Endoscopy;;   c-section     ENDOSCOPIC RETROGRADE CHOLANGIOPANCREATOGRAPHY (ERCP) WITH PROPOFOL  N/A 01/04/2024   Procedure: ENDOSCOPIC RETROGRADE CHOLANGIOPANCREATOGRAPHY (ERCP) WITH PROPOFOL ;  Surgeon: Jinny Carmine, MD;  Location: [facility];  Service: Endoscopy;  Laterality: N/A;   REMOVAL OF STONES  01/04/2024   Procedure: REMOVAL OF STONES;  Surgeon: Jinny Carmine, MD;  Location: [facility];  Service: Endoscopy;;   TRACHEOSTOMY     decannulated 12/2009   TUBAL LIGATION     uterine ablasion     Active Ambulatory Problems    Diagnosis Date Noted   Cigarette smoker 09/12/2010   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  02/14/2010   ADULT RESPIRATORY DISTRESS SYNDROME 02/13/2010   DELIRIUM 02/13/2010   Oral candidiasis 07/03/2011   DM (diabetes mellitus) (HCC) 11/21/2011   Candida infection 11/26/2011    GERD (gastroesophageal reflux disease) 12/21/2012   COPD exacerbation (HCC) 04/22/2013   Community acquired pneumonia 03/11/2014   COPD with acute exacerbation (HCC) 08/14/2014   Sepsis (HCC) 10/15/2015   Anxiety state 10/15/2015   Essential hypertension 10/15/2015   Atypical pneumonia - SEPSIS RULED OUT 09/28/2016   Chronic obstructive pulmonary disease (HCC)    Chronic pain syndrome 11/27/2019   Lobar pneumonia 11/27/2019   Chronic respiratory failure with hypoxia (HCC) 04/23/2020   Sepsis due to undetermined organism (HCC) 08/17/2021   ERRONEOUS ENCOUNTER--DISREGARD 10/30/2022   Acute on chronic respiratory failure (HCC) 04/20/2023   Hyperlipidemia 04/20/2023   Hypokalemia 04/20/2023   Hyponatremia 04/20/2023   Atypical chest pain 04/20/2023   Choledocholithiasis with acute cholecystitis 01/04/2024   Cholecystitis 01/04/2024   Acute pyelonephritis 01/04/2024   Choledocholithiasis 01/05/2024   Polyneuropathy due to type 2 diabetes mellitus (HCC) 08/22/2021   Oxygen  dependent 08/22/2021   Neck pain 05/29/2004   Migraine 08/22/2021   Electrocardiogram abnormal 04/22/2022   CAP (community acquired pneumonia) 02/21/2024   Encounter for smoking cessation counseling 02/21/2024   Tobacco abuse 08/09/2024  Chronic respiratory failure with hypoxia and hypercapnia (HCC) 08/09/2024   Uncontrolled type 2 diabetes mellitus with hyperglycemia, with long-term current use of insulin  (HCC) 08/10/2024   Resolved Ambulatory Problems    Diagnosis Date Noted   UNSPECIFIED NUTRITIONAL DEFICIENCY 02/13/2010   MAJOR DPRSV DISORDER RECURRENT EPISODE MODERATE 02/14/2010   NARCOTIC ABUSE 02/14/2010   Low back pain 06/16/2011   Muscle spasms of lower extremity 06/16/2011   Viral syndrome 11/21/2011   Healthcare-associated pneumonia 02/03/2012   Constipation 12/21/2012   Multifocal Pneumonia 03/11/2014   Acute-on-chronic respiratory failure (HCC) 03/13/2014   Hypokalemia 10/15/2015   SIRS  (systemic inflammatory response syndrome) (HCC) 10/16/2015   Pneumonia 05/30/2016   Acute on chronic respiratory failure with hypoxia and hypercapnia (HCC) 11/27/2019   Hoarseness or changing voice 01/11/2020   Nausea and vomiting 01/11/2020   Abnormal gall bladder diagnostic imaging    Acute hypercapnic respiratory failure (HCC) 01/22/2022   Past Medical History:  Diagnosis Date   Angina    ARDS (adult respiratory distress syndrome) (HCC)    Asthma    Chronic back pain    COPD (chronic obstructive pulmonary disease) (HCC)    Diabetes mellitus    Gallstones with biliary obstruction    HTN (hypertension) 10/15/2015   Hyperglycemia, drug-induced    Migraine headache    On home O2    Recurrent upper respiratory infection (URI)    Shortness of breath    Constitutional Exam  General appearance: Well nourished, well developed, and well hydrated. In no apparent acute distress There were no vitals filed for this visit. BMI Assessment: Estimated body mass index is 28.53 kg/m as calculated from the following:   Height as of 08/06/24: 5' (1.524 m).   Weight as of 08/06/24: 146 lb 1.6 oz (66.3 kg).  BMI interpretation table: BMI level Category Range association with higher incidence of chronic pain  <18 kg/m2 Underweight   18.5-24.9 kg/m2 Ideal body weight   25-29.9 kg/m2 Overweight Increased incidence by 20%  30-34.9 kg/m2 Obese (Class I) Increased incidence by 68%  35-39.9 kg/m2 Severe obesity (Class II) Increased incidence by 136%  >40 kg/m2 Extreme obesity (Class III) Increased incidence by 254%   Patient's current BMI Ideal Body weight  There is no height or weight on file to calculate BMI. Patient weight not recorded   BMI Readings from Last 4 Encounters:  08/06/24 28.53 kg/m  06/26/24 26.26 kg/m  02/21/24 26.26 kg/m  01/19/24 26.29 kg/m   Wt Readings from Last 4 Encounters:  08/06/24 146 lb 1.6 oz (66.3 kg)  06/26/24 134 lb 7.7 oz (61 kg)  02/21/24 134 lb 7.7 oz (61  kg)  01/19/24 134 lb 9.6 oz (61.1 kg)    Psych/Mental status: Alert, oriented x 3 (person, place, & time)       Eyes: PERLA Respiratory: No evidence of acute respiratory distress  Assessment  Primary Diagnosis & Pertinent Problem List: There were no encounter diagnoses.  Visit Diagnosis (New problems to examiner): No diagnosis found. Plan of Care (Initial workup plan)  Note: [name] was reminded that as per protocol, today's visit has been an evaluation only. We have not taken over the patient's controlled substance management.  Problem-specific plan: Assessment and Plan            Lab Orders  No laboratory test(s) ordered today   Imaging Orders  No imaging studies ordered today   Referral Orders  No referral(s) requested today   Procedure Orders    No procedure(s)  ordered today   Pharmacotherapy (current): Medications ordered:  No orders of the defined types were placed in this encounter.  Medications administered during this visit: [name] had no medications administered during this visit.   Analgesic Pharmacotherapy:  Opioid Analgesics: For patients currently taking or requesting to take opioid analgesics, in accordance with [place]  Medical Board Guidelines, we will assess their risks and indications for the use of these substances. After completing our evaluation, we may offer recommendations, but we no longer take patients for medication management. The prescribing physician will ultimately decide, based on his/her training and level of comfort whether to adopt any of the recommendations, including whether or not to prescribe such medicines.  Membrane stabilizer: To be determined at a later time  Muscle relaxant: To be determined at a later time  NSAID: To be determined at a later time  Other analgesic(s): To be determined at a later time   Interventional management options: [name] was informed that there is no guarantee that she would be a  candidate for interventional therapies. The decision will be based on the results of diagnostic studies, as well as [name]'s risk profile.  Procedure(s) under consideration:  Pending results of ordered studies     Interventional Therapies  Risk Factors  Considerations  Medical Comorbidities:     Planned  Pending:      Under consideration:   Pending   Completed: (Analgesic benefit)1  None at this time   Therapeutic  Palliative (PRN) options:   None established   Completed by other providers:   None reported  1(Analgesic benefit): Expressed in percentage (%). (Local anesthetic[LA] +/- sedation  L.A.Local Anesthetic  Steroid benefit  Ongoing benefit)   Provider-requested follow-up: No follow-ups on file.  Future Appointments  Date Time Provider Department Center  09/06/2024 10:00 AM Tanya Glisson, MD [facility] None  09/08/2024 10:30 AM Ruthell Lauraine FALCON, NP [facility] [phone] LELON Das   I discussed the assessment and treatment plan with the patient. The patient was provided an opportunity to ask questions and all were answered. The patient agreed with the plan and demonstrated an understanding of the instructions.  Patient advised to call back or seek an in-person evaluation if the symptoms or condition worsens.  Duration of encounter: *** minutes.  Total time on encounter, as per AMA guidelines included both the face-to-face and non-face-to-face time personally spent by the physician and/or other qualified health care professional(s) on the day of the encounter (includes time in activities that require the physician or other qualified health care professional and does not include time in activities normally performed by clinical staff). Physician's time may include the following activities when performed: Preparing to see the patient (e.g., pre-charting review of records, searching for previously ordered imaging, lab work, and nerve conduction tests) Review of prior  analgesic pharmacotherapies. Reviewing PMP Interpreting ordered tests (e.g., lab work, imaging, nerve conduction tests) Performing post-procedure evaluations, including interpretation of diagnostic procedures Obtaining and/or reviewing separately obtained history Performing a medically appropriate examination and/or evaluation Counseling and educating the patient/family/caregiver Ordering medications, tests, or procedures Referring and communicating with other health care professionals (when not separately reported) Documenting clinical information in the electronic or other health record Independently interpreting results (not separately reported) and communicating results to the patient/ family/caregiver Care coordination (not separately reported)  Note by: Glisson DELENA Tanya, MD (TTS and AI technology used. I apologize for any typographical errors that were not detected and corrected.) Date: 09/06/2024; Time: 7:37 AM

## 2024-09-06 ENCOUNTER — Ambulatory Visit (HOSPITAL_BASED_OUTPATIENT_CLINIC_OR_DEPARTMENT_OTHER): Admitting: Pain Medicine

## 2024-09-06 ENCOUNTER — Encounter (INDEPENDENT_AMBULATORY_CARE_PROVIDER_SITE_OTHER): Payer: Self-pay | Admitting: Gastroenterology

## 2024-09-06 DIAGNOSIS — Z91199 Patient's noncompliance with other medical treatment and regimen due to unspecified reason: Secondary | ICD-10-CM

## 2024-09-06 DIAGNOSIS — Z79899 Other long term (current) drug therapy: Secondary | ICD-10-CM | POA: Insufficient documentation

## 2024-09-06 DIAGNOSIS — R825 Elevated urine levels of drugs, medicaments and biological substances: Secondary | ICD-10-CM | POA: Insufficient documentation

## 2024-09-06 DIAGNOSIS — G8929 Other chronic pain: Secondary | ICD-10-CM

## 2024-09-06 DIAGNOSIS — M899 Disorder of bone, unspecified: Secondary | ICD-10-CM | POA: Insufficient documentation

## 2024-09-06 DIAGNOSIS — R892 Abnormal level of other drugs, medicaments and biological substances in specimens from other organs, systems and tissues: Secondary | ICD-10-CM | POA: Insufficient documentation

## 2024-09-06 DIAGNOSIS — J439 Emphysema, unspecified: Secondary | ICD-10-CM | POA: Insufficient documentation

## 2024-09-06 DIAGNOSIS — Z789 Other specified health status: Secondary | ICD-10-CM | POA: Insufficient documentation

## 2024-09-06 DIAGNOSIS — R937 Abnormal findings on diagnostic imaging of other parts of musculoskeletal system: Secondary | ICD-10-CM | POA: Insufficient documentation

## 2024-09-08 ENCOUNTER — Ambulatory Visit (INDEPENDENT_AMBULATORY_CARE_PROVIDER_SITE_OTHER): Admitting: Acute Care

## 2024-09-08 ENCOUNTER — Encounter: Payer: Self-pay | Admitting: Acute Care

## 2024-09-08 ENCOUNTER — Telehealth: Payer: Self-pay | Admitting: Acute Care

## 2024-09-08 VITALS — BP 122/78 | HR 107 | Temp 98.4°F | Ht 60.0 in | Wt 150.8 lb

## 2024-09-08 DIAGNOSIS — F17210 Nicotine dependence, cigarettes, uncomplicated: Secondary | ICD-10-CM | POA: Diagnosis not present

## 2024-09-08 DIAGNOSIS — J449 Chronic obstructive pulmonary disease, unspecified: Secondary | ICD-10-CM

## 2024-09-08 DIAGNOSIS — J441 Chronic obstructive pulmonary disease with (acute) exacerbation: Secondary | ICD-10-CM

## 2024-09-08 DIAGNOSIS — R9389 Abnormal findings on diagnostic imaging of other specified body structures: Secondary | ICD-10-CM

## 2024-09-08 DIAGNOSIS — R942 Abnormal results of pulmonary function studies: Secondary | ICD-10-CM

## 2024-09-08 DIAGNOSIS — Z8639 Personal history of other endocrine, nutritional and metabolic disease: Secondary | ICD-10-CM

## 2024-09-08 DIAGNOSIS — J9611 Chronic respiratory failure with hypoxia: Secondary | ICD-10-CM

## 2024-09-08 DIAGNOSIS — E119 Type 2 diabetes mellitus without complications: Secondary | ICD-10-CM

## 2024-09-08 DIAGNOSIS — J069 Acute upper respiratory infection, unspecified: Secondary | ICD-10-CM

## 2024-09-08 DIAGNOSIS — Z720 Tobacco use: Secondary | ICD-10-CM

## 2024-09-08 DIAGNOSIS — R918 Other nonspecific abnormal finding of lung field: Secondary | ICD-10-CM | POA: Diagnosis not present

## 2024-09-08 DIAGNOSIS — Z9981 Dependence on supplemental oxygen: Secondary | ICD-10-CM | POA: Diagnosis not present

## 2024-09-08 DIAGNOSIS — Z794 Long term (current) use of insulin: Secondary | ICD-10-CM

## 2024-09-08 MED ORDER — AZITHROMYCIN 250 MG PO TABS: ORAL_TABLET | ORAL | 0 refills | Status: DC

## 2024-09-08 MED ORDER — PREDNISONE 10 MG PO TABS: ORAL_TABLET | ORAL | 0 refills | Status: DC

## 2024-09-08 NOTE — Telephone Encounter (Signed)
 Please schedule the following:  Provider performing procedure: navigational bronchoscopy with biopsies Diagnosis: Lung ndule Which side if for nodule / mass?  left Procedure: navigational bronch with biopsies  Has patient been spoken to by Provider and given informed consent? Chaney Domino NP  Anesthesia: general Do you need Fluro?  Yes Duration of procedure:  1.5 hours Date: 09/25/2024 Alternate Date:  There is availability  Time: Please schedule first afternoon case Location:  [facility] Does patient have OSA?  No DM? Yes Or Latex allergy? No Medication Restriction/ Anticoagulate/Antiplatelet:  None Pre-op Labs Ordered:determined by Anesthesia Imaging request:  Last LDCT 07/27/2024  (If, SuperDimension CT Chest, please have STAT courier sent to [facility])

## 2024-09-08 NOTE — Telephone Encounter (Signed)
 [facility] has been scheduled for 11/3 at 2:43 PM, check in by 12:10 PM, f/u w/ Sarah 11/10 @ 9:30 AM.  Case# [id number].  Letter given to pt by nurse while here for the appt.  Routing to [employer] for prior auth

## 2024-09-08 NOTE — Progress Notes (Signed)
 History of Present Illness [name] is a 58 y.o. female current every day smoker referred for consultation of abnormal chest imaging 07/2024. She will be followed by Lauraine Lites NP and Dr.Byrum .   09/08/2024 Discussed the use of AI scribe software for clinical note transcription with the patient, who gave verbal consent to proceed.  History of Present Illness Patient presents for follow-up after PET scan done 08/28/2024 to further evaluate the pulmonary nodule noted on the low-dose CT screening scan September 2025. Lung cancer screening scan was read as LR 4A.  There was a left upper lobe pulmonary nodule nodule had grown 8.9 to 13 mm in 3 months.  PET scan was ordered for follow-up.  We have reviewed the PET scan today.  PET scan shows a 12 mm left apical pleural based hypermetabolic pulmonary nodule that is highly suspicious for stage Ia primary bronchogenic carcinoma.  SUV max of the dominant left apical pleural-based pulmonary nodule was 4.7.  There is a more central left apical nodule measuring 7 mm with an SUV of 1.4.  This nodule is at the low end of PET resolution.  I discussed the results of the PET with the patient and her daughter.  We discussed next steps being a bronchoscopy with biopsy for tissue diagnosis.  Patient has recently been in the hospital within the last month with COPD exacerbation.  She has chronic hypoxic respiratory failure and wears 3 L of oxygen  at all times.  I believe she is having a flare today based on her exam.  Patient is continuing to smoke.  Patient opted for bronchoscopy with biopsy for tissue diagnosis.  This has been ordered and will be done September 25, 2024. I am also treating patient for a COPD flare with a Z-Pak and a prednisone  taper.  She is a borderline candidate for navigational bronchoscopy with biopsy.  However she is 58 years old and would benefit from tissue diagnosis in the event she has any markers that would indicate options for targeted  or immune therapy if this is indeed a cancer.  We have discussed the risks of the procedure to include bleeding, infection, pneumothorax, and adverse reaction to anesthesia.  She verbalized understanding and is agreeable to proceed.     Test Results: PET Scan  08/28/2024 12 mm left apical pleural-based Hypermetabolic Pulmonary Nodule, highly suspicious for stage 1a primary bronchogenic carcinoma. 2. More cephalad and central 7mm left upper lobe pulmonary nodule is at the low end of PET resolution and indeterminate. 3. 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 4. Aortic atherosclerosis (icd10-i70.0) and emphysema (icd10-j43.9).   LDCT 07/27/2024 Mediastinum/Nodes: Imaged thyroid gland without nodules meeting criteria for imaging follow-up by size. Normal esophagus. Increased 13 mm right hilar lymph node (2:28), previously 10 mm   Lungs/Pleura: The central airways are patent. Mild centrilobular and paraseptal emphysema. Mild diffuse bronchiectasis with increased diffuse bronchial wall thickening and extensive new and increasing tree-in-bud nodularity throughout both lungs. Previously noted medial left apical nodule has increased in size to 13.0 mm (4:81), previously 8.9 mm. Adjacent 8.2 mm nodule (4:68) is also increased in size. No pneumothorax. No pleural effusion.  Lung-RADS 4B, suspicious. Additional imaging evaluation or consultation with Pulmonology or Thoracic Surgery recommended. Interval increase in size of left apical nodules measuring up to 13.0 mm. 2. Increased diffuse bronchial wall thickening and extensive new and increasing tree-in-bud nodularity throughout both lungs, consistent with worsening infectious/inflammatory bronchiolitis. 3. Increased size of right hilar lymph node, indeterminate, but  likely reactive. 4. Aortic Atherosclerosis (ICD10-I70.0) and Emphysema (ICD10-J43.9).   LDCT Chest 04/14/2024 Lungs/Pleura: Centrilobular and paraseptal emphysema  evident. There is diffuse bronchial wall thickening and irregularity with scattered ground-glass and confluent nodular opacities in both lungs. Dominant confluent nodular opacities in the medial left upper lobe measuring 8.9 mm (image 69). No pleural effusion. IMPRESSION: 1. Lung-RADS 4A, suspicious. 8.9 mm confluent nodular opacity in the medial left upper lobe.Follow up low-dose chest CT without contrast in 3 months (please use the following order, CT CHEST LCS NODULE FOLLOW-UP W/O CM) is recommended. Alternatively, PET may be considered when there is a solid component 8mm or larger. 2. Diffuse bronchial wall thickening and irregularity with scattered ground-glass and confluent nodular opacities in both lungs. 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 8.9 mm confluent nodular opacity of concern in the medial left upper lobe may well be infectious/inflammatory but warrants close follow-up. 3. Aortic Atherosclerosis (ICD10-I70.0) and Emphysema (ICD10-J43.9).      Latest Ref Rng & Units 08/07/2024    4:18 AM 08/06/2024    1:50 PM 06/26/2024    7:52 PM  CBC  WBC 4.0 - 10.5 K/uL 9.4  13.8  9.3   Hemoglobin 12.0 - 15.0 g/dL 87.7  87.1  86.5   Hematocrit 36.0 - 46.0 % 40.5  41.4  43.0   Platelets 150 - 400 K/uL 326  303  301        Latest Ref Rng & Units 08/10/2024    4:40 AM 08/07/2024    4:18 AM 08/06/2024    1:50 PM  BMP  Glucose 70 - 99 mg/dL 700  756  772   BUN 6 - 20 mg/dL 19  8  8    Creatinine 0.44 - 1.00 mg/dL 9.40  9.59  9.66   Sodium 135 - 145 mmol/L 134  138  128   Potassium 3.5 - 5.1 mmol/L 4.1  4.6  3.6   Chloride 98 - 111 mmol/L 90  95  88   CO2 22 - 32 mmol/L 33  34  30   Calcium  8.9 - 10.3 mg/dL 9.1  8.5  8.1     BNP    Component Value Date/Time   BNP 47.0 08/06/2024 1350    ProBNP    Component Value Date/Time    PROBNP 345.0 (H) 03/02/2011 1630    PFT No results found for: FEV1PRE, FEV1POST, FVCPRE, FVCPOST, TLC, DLCOUNC, PREFEV1FVCRT, PSTFEV1FVCRT  NM PET Image Initial (PI) Skull Base To Thigh (F-18 FDG) Result Date: 08/29/2024 EXAM: PET AND CT SKULL BASE TO MID THIGH 08/28/2024 10:33:23 AM TECHNIQUE: RADIOPHARMACEUTICAL: 7.23 mCi F-18 FDG Uptake time 60 minutes. Glucose level 163 mg/dl. PET imaging was acquired from the base of the skull to the mid thighs. Non-contrast enhanced computed tomography was obtained for attenuation correction and anatomic localization. COMPARISON: Chest CT of 07/27/2024. Abdominal pelvic CT of 06/26/2024. CLINICAL HISTORY: Lung nodule, > 8mm. 7.23 mCi F18 FDG RT Hand @ 0850 by ALM; BG: 163 mg/dL, no diabetic meds this morning; Initial PET for Lung nodule, > 8mm, Lung nodule seen on imaging study ; LAPAROSCOPIC CHOLECYSTECTOMY 01/06/2024; eov FINDINGS: HEAD AND NECK: No metabolically active cervical lymphadenopathy. No cervical adenopathy. Significantly age-advanced bilateral carotid atherosclerosis. Fluid level in the left maxillary sinus. CHEST: No thoracic nodal hypermetabolism. The dominant left apical pleural-based pulmonary nodule measures 12 x 11 mm and SUV 4.7 on image 15 / 7. The more central,  cephalad left apical nodule measures 7 mm and SUV 1.4 on image 11 / 7. This is at the low end of PET resolution. Aortic atherosclerosis. Centrilobular emphysema. Diffuse peribronchovascular reticular nodular opacities were detailed on prior diagnostic CT and are grossly similar. ABDOMEN AND PELVIS: No abdominal or pelvic nodal or parenchymal hypermetabolism. Normal adrenal glands. Gallbladder not visualized, likely surgically absent. Small bowel non-rotation, with majority of small bowel position in the right side of the abdomen and lack of normal duodenal / jejunal junction position. Fat-containing left paramidline ventral abdominal wall laxity is similar. BONES AND SOFT  TISSUE: No abnormal marrow activity. Mild osteopenia. IMPRESSION: 1. 12 mm left apical pleural-based Hypermetabolic Pulmonary Nodule, highly suspicious for stage 1a primary bronchogenic carcinoma. 2. More cephalad and central 7mm left upper lobe pulmonary nodule is at the low end of PET resolution and indeterminate. 3. 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 4. Aortic atherosclerosis (icd10-i70.0) and emphysema (icd10-j43.9). Electronically signed by: Rockey Kilts MD 08/29/2024 05:19 PM EDT RP Workstation: [id number]     Past medical hx Past Medical History:  Diagnosis Date   Angina    Anxiety state 10/15/2015   ARDS (adult respiratory distress syndrome) (HCC)    Jan 2011   Asthma    Chronic back pain    COPD (chronic obstructive pulmonary disease) (HCC)    Diabetes mellitus    Encounter for smoking cessation counseling 02/21/2024   Gallstones with biliary obstruction    GERD (gastroesophageal reflux disease) 12/21/2012   HTN (hypertension) 10/15/2015   Hyperglycemia, drug-induced    steroid induced hyperglycemia   Migraine headache    On home O2    Pneumonia    Recurrent upper respiratory infection (URI)    Shortness of breath      Social History   Tobacco Use   Smoking status: Every Day    Current packs/day: 1.00    Average packs/day: 1 pack/day for 46.8 years (46.8 ttl pk-yrs)    Types: Cigarettes    Start date: 1979   Smokeless tobacco: Never   Tobacco comments:    1 pack a day 09/08/2024 KRD  Vaping Use   Vaping status: Never Used  Substance Use Topics   Alcohol use: No   Drug use: No    Comment: Formerly used cocaine , narcotics, THC    Ms.Molinelli reports that she has been smoking cigarettes. She started smoking about 46 years ago. She has a 46.8 pack-year smoking history. She has never used smokeless tobacco. She reports that she does not drink alcohol and does not use drugs.  Tobacco Cessation: Ready to quit: Not Answered Counseling given: Not  Answered Tobacco comments: 1 pack a day 09/08/2024 KRD Currently smokes 1 pack/day with a pack-year smoking history of 47 pack years. Counseled on smoking cessation for 3 minutes today, resources provided to help with smoking cessation see AVS  Past surgical hx, Family hx, Social hx all reviewed.  Current Outpatient Medications on File Prior to Visit  Medication Sig   albuterol  (PROVENTIL ) (2.5 MG/3ML) 0.083% nebulizer solution Take 3 mLs (2.5 mg total) by nebulization every 6 (six) hours as needed for wheezing or shortness of breath.   albuterol  (VENTOLIN  HFA) 108 (90 Base) MCG/ACT inhaler Inhale 2 puffs into the lungs every 4 (four) hours as needed for wheezing or shortness of breath.   glipiZIDE  (GLUCOTROL ) 10 MG tablet Take 20 mg by mouth daily before breakfast.   insulin  aspart (NOVOLOG  FLEXPEN) 100 UNIT/ML FlexPen 3 TIMES A DAY WITH MEALS  SUGAR  70-120--no units, 121-150--3 units, 151-200--4 units; 201-250--7 units, 251-300--11 units; 301-350 15 units; 351-400 20 units   LANTUS  SOLOSTAR 100 UNIT/ML Solostar Pen Inject 30 Units into the skin at bedtime.   ondansetron  (ZOFRAN -ODT) 4 MG disintegrating tablet 4mg  ODT q4 hours prn nausea/vomit (Patient taking differently: Take 4 mg by mouth every 4 (four) hours as needed for nausea or vomiting. 4mg  ODT q4 hours prn nausea/vomit)   oxyCODONE  (OXY IR/ROXICODONE ) 5 MG immediate release tablet Take 1 tablet (5 mg total) by mouth every 6 (six) hours as needed for moderate pain (pain score 4-6) or severe pain (pain score 7-10).   pantoprazole  (PROTONIX ) 40 MG tablet Take 1 tablet (40 mg total) by mouth daily. (Patient taking differently: Take 40 mg by mouth every other day.)   predniSONE  (DELTASONE ) 10 MG tablet Take 6 tablets (60 mg total) by mouth daily with breakfast. And decrease by one tablet daily   rOPINIRole  (REQUIP ) 2 MG tablet Take 2 mg by mouth 3 (three) times daily.   rosuvastatin  (CRESTOR ) 40 MG tablet Take 40 mg by mouth daily.    SUMAtriptan  (IMITREX ) 50 MG tablet Take 50 mg by mouth as needed for migraine or headache.   SYMBICORT  80-4.5 MCG/ACT inhaler Inhale 2 puffs into the lungs 2 (two) times daily.   [DISCONTINUED] ARIPiprazole  (ABILIFY ) 5 MG tablet Take 5 mg by mouth daily.     [DISCONTINUED] venlafaxine  (EFFEXOR ) 75 MG tablet Take 75 mg by mouth daily.     No current facility-administered medications on file prior to visit.     Allergies  Allergen Reactions   Levofloxacin  Anaphylaxis    Arm also turned red where IV was place   Penicillins Other (See Comments)    convulsions   Doxycycline  Nausea And Vomiting    Review Of Systems:  Constitutional:   No  weight loss, night sweats,  Fevers, chills, +fatigue, or  lassitude.  HEENT:   No headaches,  Difficulty swallowing,  Tooth/dental problems, or  Sore throat,                No sneezing, itching, ear ache, nasal congestion, +post nasal drip,   CV:  No chest pain,  Orthopnea, PND, swelling in lower extremities, anasarca, dizziness, palpitations, syncope.   GI  No heartburn, indigestion, abdominal pain, nausea, vomiting, diarrhea, change in bowel habits, loss of appetite, bloody stools.   Resp: + shortness of breath with exertion less at rest.  Oxygen  saturations today in the office after walking back to the exam room were 95% on 3 L nasal cannula.  + Baseline excess mucus, + baseline productive cough,  No non-productive cough,  No coughing up of blood.  No change in color of mucus.  + wheezing.  No chest wall deformity  Skin: no rash or lesions.  GU: no dysuria, change in color of urine, no urgency or frequency.  No flank pain, no hematuria   MS:  No joint pain or swelling.  No decreased range of motion.  No back pain.  Psych:  No change in mood or affect. No depression or anxiety.  No memory loss.  Patient is appropriately concerned about how difficult it is for her to quit smoking.  We counseled her today however she states it is best that she is  allowed to work this out on her own.  She is very frustrated with herself for continuing to smoke.   Vital Signs BP 122/78   Pulse (!) 107   Temp 98.4  F (36.9 C) (Oral)   Ht 5' (1.524 m)   Wt 150 lb 12.8 oz (68.4 kg)   SpO2 95% Comment: on 3L  BMI 29.45 kg/m    Physical Exam:  General- No distress,  A&Ox3, pleasant ill appearing female wearing oxygen  ENT: No sinus tenderness, TM clear, pale nasal mucosa, no oral exudate,+ post nasal drip, no LAN Cardiac: S1, S2, regular rate and rhythm, no murmur Chest: + Expiratory wheeze/ No rales/ dullness; no accessory muscle use, no nasal flaring, no sternal retractions, significantly diminished per bases, prolonged expiratory phase to respiratory cycle Abd.: Soft Non-tender, nondistended, bowel sounds positive,Body mass index is 29.45 kg/m.  Ext: No clubbing cyanosis, edema Neuro:  normal strength Skin: No rashes, warm and dry Psych: normal mood and behavior   Assessment & Plan Abnormal low-dose CT and current everyday smoker with a 47-pack-year smoking history Abnormal PET scan concerning for primary bronchogenic carcinoma Recent COPD exacerbation with hospitalization Chronic hypoxic respiratory failure Plan We have reviewed the results of your PET scan. The nodule of concern has hypermetabolic activity on the scan. Plan moving forward will be for a navigational bronchoscopy with biopsy. I have placed an order for a bronchoscopy with biopsies.  We have discussed the procedure in detail.  We have reviewed the risks and benefits of the procedure. These include bleeding, infection, puncture of the lung, and adverse reaction to anesthesia. You have agreed to proceed with biopsy to evaluate the lung nodules of concern. Your procedure will be done by Dr. Lamar Chris. You will receive a letter today with date time and information pertaining to the procedure. You will need someone to drive you to the procedure, stay with you during the  procedure, and stay with you after the procedure. You will also need someone to stay with you for 24 hours after anesthesia to ensure you have cleared and are doing well. You will follow-up with me 1 week after the procedure to review the results and to ensure you are doing well. Call if you need us  prior to the procedure or if you have any questions at all. Please contact office for sooner follow up if symptoms do not improve or worsen or seek emergency care . I am treating you for a COPD flare. I have sent in a ,Prednisone  taper; 10 mg tablets: 4 tabs x 2 days, 3 tabs x 2 days, 2 tabs x 2 days 1 tab x 2 days then stop.  I have also sent in a Z-Pak. Take 2 tablets today, and 1 tablet daily for the next 4 days Please work on quitting smoking. - Continue Pulmicort , Bravana, and Upelry. - Monitor blood sugars due to prednisone  use. Sinusitis with post-nasal drip Frequent sinus colds lead to post-nasal drip and coughing. Not using non-sedating antihistamines or nasal sprays. - Recommend over-the-counter non-sedating antihistamines such as Zyrtec, Xyzal, or Allegra. - Suggest Flonase  for reducing sinus inflammation.  Diabetes Mellitus Diabetes managed with Lantus  and fast-acting insulin . Advised to withhold insulin  on biopsy day. - Instruct to withhold insulin  on the day of the biopsy. - Anesthesia will be in touch with more specific instructions about your diabetic management day of procedure  Antibiotic allergy Allergies to several antibiotics, including penicillin and cefdinir , limit treatment options. - Prescribe Z-Pak (azithromycin ) for upper respiratory symptoms.  You can receive free nicotine  replacement therapy (patches, gum, or mints) by calling 1-800-QUIT NOW. Please call so we can get you on the path to becoming a non-smoker. I know it  is hard, but you can do this!  Hypnosis for smoking cessation  [employer] [phone]  Acupuncture for smoking cessation  [employer] [phone]    I spent 40 minutes dedicated to the care of this patient on the date of this encounter to include pre-visit review of records, face-to-face time with the patient discussing conditions above, post visit ordering of testing, clinical documentation with the electronic health record, making appropriate referrals as documented, and communicating necessary information to the patient's healthcare team.       Lauraine JULIANNA Lites, NP 09/08/2024  10:40 AM

## 2024-09-08 NOTE — H&P (View-Only) (Signed)
 History of Present Illness [name] is a 58 y.o. female current every day smoker referred for consultation of abnormal chest imaging 07/2024. She will be followed by Lauraine Lites NP and Dr.Byrum .   09/08/2024 Discussed the use of AI scribe software for clinical note transcription with the patient, who gave verbal consent to proceed.  History of Present Illness Patient presents for follow-up after PET scan done 08/28/2024 to further evaluate the pulmonary nodule noted on the low-dose CT screening scan September 2025. Lung cancer screening scan was read as LR 4A.  There was a left upper lobe pulmonary nodule nodule had grown 8.9 to 13 mm in 3 months.  PET scan was ordered for follow-up.  We have reviewed the PET scan today.  PET scan shows a 12 mm left apical pleural based hypermetabolic pulmonary nodule that is highly suspicious for stage Ia primary bronchogenic carcinoma.  SUV max of the dominant left apical pleural-based pulmonary nodule was 4.7.  There is a more central left apical nodule measuring 7 mm with an SUV of 1.4.  This nodule is at the low end of PET resolution.  I discussed the results of the PET with the patient and her daughter.  We discussed next steps being a bronchoscopy with biopsy for tissue diagnosis.  Patient has recently been in the hospital within the last month with COPD exacerbation.  She has chronic hypoxic respiratory failure and wears 3 L of oxygen  at all times.  I believe she is having a flare today based on her exam.  Patient is continuing to smoke.  Patient opted for bronchoscopy with biopsy for tissue diagnosis.  This has been ordered and will be done September 25, 2024. I am also treating patient for a COPD flare with a Z-Pak and a prednisone  taper.  She is a borderline candidate for navigational bronchoscopy with biopsy.  However she is 58 years old and would benefit from tissue diagnosis in the event she has any markers that would indicate options for targeted  or immune therapy if this is indeed a cancer.  We have discussed the risks of the procedure to include bleeding, infection, pneumothorax, and adverse reaction to anesthesia.  She verbalized understanding and is agreeable to proceed.     Test Results: PET Scan  08/28/2024 12 mm left apical pleural-based Hypermetabolic Pulmonary Nodule, highly suspicious for stage 1a primary bronchogenic carcinoma. 2. More cephalad and central 7mm left upper lobe pulmonary nodule is at the low end of PET resolution and indeterminate. 3. 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 4. Aortic atherosclerosis (icd10-i70.0) and emphysema (icd10-j43.9).   LDCT 07/27/2024 Mediastinum/Nodes: Imaged thyroid gland without nodules meeting criteria for imaging follow-up by size. Normal esophagus. Increased 13 mm right hilar lymph node (2:28), previously 10 mm   Lungs/Pleura: The central airways are patent. Mild centrilobular and paraseptal emphysema. Mild diffuse bronchiectasis with increased diffuse bronchial wall thickening and extensive new and increasing tree-in-bud nodularity throughout both lungs. Previously noted medial left apical nodule has increased in size to 13.0 mm (4:81), previously 8.9 mm. Adjacent 8.2 mm nodule (4:68) is also increased in size. No pneumothorax. No pleural effusion.  Lung-RADS 4B, suspicious. Additional imaging evaluation or consultation with Pulmonology or Thoracic Surgery recommended. Interval increase in size of left apical nodules measuring up to 13.0 mm. 2. Increased diffuse bronchial wall thickening and extensive new and increasing tree-in-bud nodularity throughout both lungs, consistent with worsening infectious/inflammatory bronchiolitis. 3. Increased size of right hilar lymph node, indeterminate, but  likely reactive. 4. Aortic Atherosclerosis (ICD10-I70.0) and Emphysema (ICD10-J43.9).   LDCT Chest 04/14/2024 Lungs/Pleura: Centrilobular and paraseptal emphysema  evident. There is diffuse bronchial wall thickening and irregularity with scattered ground-glass and confluent nodular opacities in both lungs. Dominant confluent nodular opacities in the medial left upper lobe measuring 8.9 mm (image 69). No pleural effusion. IMPRESSION: 1. Lung-RADS 4A, suspicious. 8.9 mm confluent nodular opacity in the medial left upper lobe.Follow up low-dose chest CT without contrast in 3 months (please use the following order, CT CHEST LCS NODULE FOLLOW-UP W/O CM) is recommended. Alternatively, PET may be considered when there is a solid component 8mm or larger. 2. Diffuse bronchial wall thickening and irregularity with scattered ground-glass and confluent nodular opacities in both lungs. 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 8.9 mm confluent nodular opacity of concern in the medial left upper lobe may well be infectious/inflammatory but warrants close follow-up. 3. Aortic Atherosclerosis (ICD10-I70.0) and Emphysema (ICD10-J43.9).      Latest Ref Rng & Units 08/07/2024    4:18 AM 08/06/2024    1:50 PM 06/26/2024    7:52 PM  CBC  WBC 4.0 - 10.5 K/uL 9.4  13.8  9.3   Hemoglobin 12.0 - 15.0 g/dL 87.7  87.1  86.5   Hematocrit 36.0 - 46.0 % 40.5  41.4  43.0   Platelets 150 - 400 K/uL 326  303  301        Latest Ref Rng & Units 08/10/2024    4:40 AM 08/07/2024    4:18 AM 08/06/2024    1:50 PM  BMP  Glucose 70 - 99 mg/dL 700  756  772   BUN 6 - 20 mg/dL 19  8  8    Creatinine 0.44 - 1.00 mg/dL 9.40  9.59  9.66   Sodium 135 - 145 mmol/L 134  138  128   Potassium 3.5 - 5.1 mmol/L 4.1  4.6  3.6   Chloride 98 - 111 mmol/L 90  95  88   CO2 22 - 32 mmol/L 33  34  30   Calcium  8.9 - 10.3 mg/dL 9.1  8.5  8.1     BNP    Component Value Date/Time   BNP 47.0 08/06/2024 1350    ProBNP    Component Value Date/Time    PROBNP 345.0 (H) 03/02/2011 1630    PFT No results found for: FEV1PRE, FEV1POST, FVCPRE, FVCPOST, TLC, DLCOUNC, PREFEV1FVCRT, PSTFEV1FVCRT  NM PET Image Initial (PI) Skull Base To Thigh (F-18 FDG) Result Date: 08/29/2024 EXAM: PET AND CT SKULL BASE TO MID THIGH 08/28/2024 10:33:23 AM TECHNIQUE: RADIOPHARMACEUTICAL: 7.23 mCi F-18 FDG Uptake time 60 minutes. Glucose level 163 mg/dl. PET imaging was acquired from the base of the skull to the mid thighs. Non-contrast enhanced computed tomography was obtained for attenuation correction and anatomic localization. COMPARISON: Chest CT of 07/27/2024. Abdominal pelvic CT of 06/26/2024. CLINICAL HISTORY: Lung nodule, > 8mm. 7.23 mCi F18 FDG RT Hand @ 0850 by ALM; BG: 163 mg/dL, no diabetic meds this morning; Initial PET for Lung nodule, > 8mm, Lung nodule seen on imaging study ; LAPAROSCOPIC CHOLECYSTECTOMY 01/06/2024; eov FINDINGS: HEAD AND NECK: No metabolically active cervical lymphadenopathy. No cervical adenopathy. Significantly age-advanced bilateral carotid atherosclerosis. Fluid level in the left maxillary sinus. CHEST: No thoracic nodal hypermetabolism. The dominant left apical pleural-based pulmonary nodule measures 12 x 11 mm and SUV 4.7 on image 15 / 7. The more central,  cephalad left apical nodule measures 7 mm and SUV 1.4 on image 11 / 7. This is at the low end of PET resolution. Aortic atherosclerosis. Centrilobular emphysema. Diffuse peribronchovascular reticular nodular opacities were detailed on prior diagnostic CT and are grossly similar. ABDOMEN AND PELVIS: No abdominal or pelvic nodal or parenchymal hypermetabolism. Normal adrenal glands. Gallbladder not visualized, likely surgically absent. Small bowel non-rotation, with majority of small bowel position in the right side of the abdomen and lack of normal duodenal / jejunal junction position. Fat-containing left paramidline ventral abdominal wall laxity is similar. BONES AND SOFT  TISSUE: No abnormal marrow activity. Mild osteopenia. IMPRESSION: 1. 12 mm left apical pleural-based Hypermetabolic Pulmonary Nodule, highly suspicious for stage 1a primary bronchogenic carcinoma. 2. More cephalad and central 7mm left upper lobe pulmonary nodule is at the low end of PET resolution and indeterminate. 3. 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 4. Aortic atherosclerosis (icd10-i70.0) and emphysema (icd10-j43.9). Electronically signed by: Rockey Kilts MD 08/29/2024 05:19 PM EDT RP Workstation: [id number]     Past medical hx Past Medical History:  Diagnosis Date   Angina    Anxiety state 10/15/2015   ARDS (adult respiratory distress syndrome) (HCC)    Jan 2011   Asthma    Chronic back pain    COPD (chronic obstructive pulmonary disease) (HCC)    Diabetes mellitus    Encounter for smoking cessation counseling 02/21/2024   Gallstones with biliary obstruction    GERD (gastroesophageal reflux disease) 12/21/2012   HTN (hypertension) 10/15/2015   Hyperglycemia, drug-induced    steroid induced hyperglycemia   Migraine headache    On home O2    Pneumonia    Recurrent upper respiratory infection (URI)    Shortness of breath      Social History   Tobacco Use   Smoking status: Every Day    Current packs/day: 1.00    Average packs/day: 1 pack/day for 46.8 years (46.8 ttl pk-yrs)    Types: Cigarettes    Start date: 1979   Smokeless tobacco: Never   Tobacco comments:    1 pack a day 09/08/2024 KRD  Vaping Use   Vaping status: Never Used  Substance Use Topics   Alcohol use: No   Drug use: No    Comment: Formerly used cocaine , narcotics, THC    Ms.Molinelli reports that she has been smoking cigarettes. She started smoking about 46 years ago. She has a 46.8 pack-year smoking history. She has never used smokeless tobacco. She reports that she does not drink alcohol and does not use drugs.  Tobacco Cessation: Ready to quit: Not Answered Counseling given: Not  Answered Tobacco comments: 1 pack a day 09/08/2024 KRD Currently smokes 1 pack/day with a pack-year smoking history of 47 pack years. Counseled on smoking cessation for 3 minutes today, resources provided to help with smoking cessation see AVS  Past surgical hx, Family hx, Social hx all reviewed.  Current Outpatient Medications on File Prior to Visit  Medication Sig   albuterol  (PROVENTIL ) (2.5 MG/3ML) 0.083% nebulizer solution Take 3 mLs (2.5 mg total) by nebulization every 6 (six) hours as needed for wheezing or shortness of breath.   albuterol  (VENTOLIN  HFA) 108 (90 Base) MCG/ACT inhaler Inhale 2 puffs into the lungs every 4 (four) hours as needed for wheezing or shortness of breath.   glipiZIDE  (GLUCOTROL ) 10 MG tablet Take 20 mg by mouth daily before breakfast.   insulin  aspart (NOVOLOG  FLEXPEN) 100 UNIT/ML FlexPen 3 TIMES A DAY WITH MEALS  SUGAR  70-120--no units, 121-150--3 units, 151-200--4 units; 201-250--7 units, 251-300--11 units; 301-350 15 units; 351-400 20 units   LANTUS  SOLOSTAR 100 UNIT/ML Solostar Pen Inject 30 Units into the skin at bedtime.   ondansetron  (ZOFRAN -ODT) 4 MG disintegrating tablet 4mg  ODT q4 hours prn nausea/vomit (Patient taking differently: Take 4 mg by mouth every 4 (four) hours as needed for nausea or vomiting. 4mg  ODT q4 hours prn nausea/vomit)   oxyCODONE  (OXY IR/ROXICODONE ) 5 MG immediate release tablet Take 1 tablet (5 mg total) by mouth every 6 (six) hours as needed for moderate pain (pain score 4-6) or severe pain (pain score 7-10).   pantoprazole  (PROTONIX ) 40 MG tablet Take 1 tablet (40 mg total) by mouth daily. (Patient taking differently: Take 40 mg by mouth every other day.)   predniSONE  (DELTASONE ) 10 MG tablet Take 6 tablets (60 mg total) by mouth daily with breakfast. And decrease by one tablet daily   rOPINIRole  (REQUIP ) 2 MG tablet Take 2 mg by mouth 3 (three) times daily.   rosuvastatin  (CRESTOR ) 40 MG tablet Take 40 mg by mouth daily.    SUMAtriptan  (IMITREX ) 50 MG tablet Take 50 mg by mouth as needed for migraine or headache.   SYMBICORT  80-4.5 MCG/ACT inhaler Inhale 2 puffs into the lungs 2 (two) times daily.   [DISCONTINUED] ARIPiprazole  (ABILIFY ) 5 MG tablet Take 5 mg by mouth daily.     [DISCONTINUED] venlafaxine  (EFFEXOR ) 75 MG tablet Take 75 mg by mouth daily.     No current facility-administered medications on file prior to visit.     Allergies  Allergen Reactions   Levofloxacin  Anaphylaxis    Arm also turned red where IV was place   Penicillins Other (See Comments)    convulsions   Doxycycline  Nausea And Vomiting    Review Of Systems:  Constitutional:   No  weight loss, night sweats,  Fevers, chills, +fatigue, or  lassitude.  HEENT:   No headaches,  Difficulty swallowing,  Tooth/dental problems, or  Sore throat,                No sneezing, itching, ear ache, nasal congestion, +post nasal drip,   CV:  No chest pain,  Orthopnea, PND, swelling in lower extremities, anasarca, dizziness, palpitations, syncope.   GI  No heartburn, indigestion, abdominal pain, nausea, vomiting, diarrhea, change in bowel habits, loss of appetite, bloody stools.   Resp: + shortness of breath with exertion less at rest.  Oxygen  saturations today in the office after walking back to the exam room were 95% on 3 L nasal cannula.  + Baseline excess mucus, + baseline productive cough,  No non-productive cough,  No coughing up of blood.  No change in color of mucus.  + wheezing.  No chest wall deformity  Skin: no rash or lesions.  GU: no dysuria, change in color of urine, no urgency or frequency.  No flank pain, no hematuria   MS:  No joint pain or swelling.  No decreased range of motion.  No back pain.  Psych:  No change in mood or affect. No depression or anxiety.  No memory loss.  Patient is appropriately concerned about how difficult it is for her to quit smoking.  We counseled her today however she states it is best that she is  allowed to work this out on her own.  She is very frustrated with herself for continuing to smoke.   Vital Signs BP 122/78   Pulse (!) 107   Temp 98.4  F (36.9 C) (Oral)   Ht 5' (1.524 m)   Wt 150 lb 12.8 oz (68.4 kg)   SpO2 95% Comment: on 3L  BMI 29.45 kg/m    Physical Exam:  General- No distress,  A&Ox3, pleasant ill appearing female wearing oxygen  ENT: No sinus tenderness, TM clear, pale nasal mucosa, no oral exudate,+ post nasal drip, no LAN Cardiac: S1, S2, regular rate and rhythm, no murmur Chest: + Expiratory wheeze/ No rales/ dullness; no accessory muscle use, no nasal flaring, no sternal retractions, significantly diminished per bases, prolonged expiratory phase to respiratory cycle Abd.: Soft Non-tender, nondistended, bowel sounds positive,Body mass index is 29.45 kg/m.  Ext: No clubbing cyanosis, edema Neuro:  normal strength Skin: No rashes, warm and dry Psych: normal mood and behavior   Assessment & Plan Abnormal low-dose CT and current everyday smoker with a 47-pack-year smoking history Abnormal PET scan concerning for primary bronchogenic carcinoma Recent COPD exacerbation with hospitalization Chronic hypoxic respiratory failure Plan We have reviewed the results of your PET scan. The nodule of concern has hypermetabolic activity on the scan. Plan moving forward will be for a navigational bronchoscopy with biopsy. I have placed an order for a bronchoscopy with biopsies.  We have discussed the procedure in detail.  We have reviewed the risks and benefits of the procedure. These include bleeding, infection, puncture of the lung, and adverse reaction to anesthesia. You have agreed to proceed with biopsy to evaluate the lung nodules of concern. Your procedure will be done by Dr. Lamar Chris. You will receive a letter today with date time and information pertaining to the procedure. You will need someone to drive you to the procedure, stay with you during the  procedure, and stay with you after the procedure. You will also need someone to stay with you for 24 hours after anesthesia to ensure you have cleared and are doing well. You will follow-up with me 1 week after the procedure to review the results and to ensure you are doing well. Call if you need us  prior to the procedure or if you have any questions at all. Please contact office for sooner follow up if symptoms do not improve or worsen or seek emergency care . I am treating you for a COPD flare. I have sent in a ,Prednisone  taper; 10 mg tablets: 4 tabs x 2 days, 3 tabs x 2 days, 2 tabs x 2 days 1 tab x 2 days then stop.  I have also sent in a Z-Pak. Take 2 tablets today, and 1 tablet daily for the next 4 days Please work on quitting smoking. - Continue Pulmicort , Bravana, and Upelry. - Monitor blood sugars due to prednisone  use. Sinusitis with post-nasal drip Frequent sinus colds lead to post-nasal drip and coughing. Not using non-sedating antihistamines or nasal sprays. - Recommend over-the-counter non-sedating antihistamines such as Zyrtec, Xyzal, or Allegra. - Suggest Flonase  for reducing sinus inflammation.  Diabetes Mellitus Diabetes managed with Lantus  and fast-acting insulin . Advised to withhold insulin  on biopsy day. - Instruct to withhold insulin  on the day of the biopsy. - Anesthesia will be in touch with more specific instructions about your diabetic management day of procedure  Antibiotic allergy Allergies to several antibiotics, including penicillin and cefdinir , limit treatment options. - Prescribe Z-Pak (azithromycin ) for upper respiratory symptoms.  You can receive free nicotine  replacement therapy (patches, gum, or mints) by calling 1-800-QUIT NOW. Please call so we can get you on the path to becoming a non-smoker. I know it  is hard, but you can do this!  Hypnosis for smoking cessation  [employer] [phone]  Acupuncture for smoking cessation  [employer] [phone]    I spent 40 minutes dedicated to the care of this patient on the date of this encounter to include pre-visit review of records, face-to-face time with the patient discussing conditions above, post visit ordering of testing, clinical documentation with the electronic health record, making appropriate referrals as documented, and communicating necessary information to the patient's healthcare team.       Lauraine JULIANNA Lites, NP 09/08/2024  10:40 AM

## 2024-09-08 NOTE — Patient Instructions (Addendum)
 It is good to see you today. We have reviewed the results of your PET scan. The nodule of concern has hypermetabolic activity on the scan. Plan moving forward will be for a navigational bronchoscopy with biopsy. I have placed an order for a bronchoscopy with biopsies.  We have discussed the procedure in detail.  We have reviewed the risks and benefits of the procedure. These include bleeding, infection, puncture of the lung, and adverse reaction to anesthesia. You have agreed to proceed with biopsy to evaluate the lung nodules of concern. Your procedure will be done by Dr. Lamar Chris. You will receive a letter today with date time and information pertaining to the procedure. You will need someone to drive you to the procedure, stay with you during the procedure, and stay with you after the procedure. You will also need someone to stay with you for 24 hours after anesthesia to ensure you have cleared and are doing well. You will follow-up with me 1 week after the procedure to review the results and to ensure you are doing well. Call if you need us  prior to the procedure or if you have any questions at all. Please contact office for sooner follow up if symptoms do not improve or worsen or seek emergency care . I am treating you for a COPD flare. I have sent in a ,Prednisone  taper; 10 mg tablets: 4 tabs x 2 days, 3 tabs x 2 days, 2 tabs x 2 days 1 tab x 2 days then stop.  I have also sent in a Z-Pak. Take 2 tablets today, and 1 tablet daily for the next 4 days Please work on quitting smoking. You can receive free nicotine  replacement therapy (patches, gum, or mints) by calling 1-800-QUIT NOW. Please call so we can get you on the path to becoming a non-smoker. I know it is hard, but you can do this!  Hypnosis for smoking cessation  [employer] [phone]  Acupuncture for smoking cessation  [employer] [phone]   This will increase your blood sugar so be sure  to monitor them carefully.  Call if you need us  sooner. Please contact office for sooner follow up if symptoms do not improve or worsen or seek emergency care

## 2024-09-21 ENCOUNTER — Encounter (HOSPITAL_COMMUNITY): Payer: Self-pay | Admitting: Emergency Medicine

## 2024-09-21 ENCOUNTER — Other Ambulatory Visit: Payer: Self-pay

## 2024-09-21 NOTE — Progress Notes (Signed)
 PCP -[facility]  Cardiologist -  Pulmonology -Lauraine Lites NP and Dr.Byrum   PPM/ICD - denies Device Orders - n/a Rep Notified - n/a  Chest x-ray - 08-06-24 EKG - 08-06-24 Stress Test - 10 + years ago ECHO - TEE- 02-2010 Cardiac Cath - denies  CPAP - denies  GLP-1 -denies  Fasting Blood Sugar - Per patient blood sugar having been running high over 200 this past week.  On Tuesday evening patient reports blood sugar over 500. Blood sugar this am 280.  Last A1c on 79-14-25 7.0.  Also reported she did not take prednisone  as ordered due to blood sugars being elevated Checks Blood Sugar  Blood Thinner Instructions: denies Aspirin  Instructions: denies  ERAS Protcol - NPO  COVID TEST- n/a  Anesthesia review: Yes, Hx of COPD, DM, HTN, recent visit to hospital due to breathing. Patient wears 02 3L at all times. Patient also reports having a head cold today, denies any temperature, reports nasal congestion, and ears are stopped up  Patient verbally denies any shortness of breath, fever, cough and chest pain during phone call   -------------  SDW INSTRUCTIONS given:  Your procedure is scheduled on September 25, 2024.  Report to [facility]tr[facility] A at 12:30 P.M., and check in at the Admitting office.  Call this number if you have problems the morning of surgery:  [phone]   Remember:  Do not eat or drink after midnight the night before your surgery      Take these medicines the morning of surgery with A SIP OF WATER  albuterol  (PROVENTIL ) nebulizer solution  albuterol  (VENTOLIN  HFA) inhaler  ondansetron  (ZOFRAN -ODT)  oxyCODONE  (OXY IR/ROXICODONE )  pantoprazole  (PROTONIX )  rosuvastatin  (CRESTOR )  SUMAtriptan  (IMITREX )  SYMBICORT     As of today, STOP taking any Aspirin  (unless otherwise instructed by your surgeon) Aleve , Naproxen , Ibuprofen , Motrin , Advil , Goody's, BC's, all herbal medications, fish oil, and all vitamins.  WHAT DO I DO ABOUT MY DIABETES  MEDICATION?   Do not take oral diabetes medicines glipiZIDE  (GLUCOTROL )  the morning of surgery.  THE NIGHT BEFORE SURGERY, take 15 units of LANTUS  insulin .       THE MORNING OF SURGERY, take aspart (NOVOLOG  FLEXPEN )If your CBG is greater than 220 mg/dL, you may take  of your sliding scale (correction) dose of insulin .  The day of surgery, do not take other diabetes injectables, including Byetta (exenatide), Bydureon (exenatide ER), Victoza (liraglutide), or Trulicity (dulaglutide).  I   HOW TO MANAGE YOUR DIABETES BEFORE AND AFTER SURGERY  Why is it important to control my blood sugar before and after surgery? Improving blood sugar levels before and after surgery helps healing and can limit problems. A way of improving blood sugar control is eating a healthy diet by:  Eating less sugar and carbohydrates  Increasing activity/exercise  Talking with your doctor about reaching your blood sugar goals High blood sugars (greater than 180 mg/dL) can raise your risk of infections and slow your recovery, so you will need to focus on controlling your diabetes during the weeks before surgery. Make sure that the doctor who takes care of your diabetes knows about your planned surgery including the date and location.  How do I manage my blood sugar before surgery? Check your blood sugar at least 4 times a day, starting 2 days before surgery, to make sure that the level is not too high or low.  Check your blood sugar the morning of your surgery when you wake up  and every 2 hours until you get to the Short Stay unit.  If your blood sugar is less than 70 mg/dL, you will need to treat for low blood sugar: Do not take insulin . Treat a low blood sugar (less than 70 mg/dL) with  cup of clear juice (cranberry or apple), 4 glucose tablets, OR glucose gel. Recheck blood sugar in 15 minutes after treatment (to make sure it is greater than 70 mg/dL). If your blood sugar is not greater than 70 mg/dL on  recheck, call [phone] for further instructions. Report your blood sugar to the short stay nurse when you get to Short Stay.  If you are admitted to the hospital after surgery: Your blood sugar will be checked by the staff and you will probably be given insulin  after surgery (instead of oral diabetes medicines) to make sure you have good blood sugar levels. The goal for blood sugar control after surgery is 80-180 mg/dL.                       Do not wear jewelry, make up, or nail polish            Do not wear lotions, powders, perfumes/colognes, or deodorant.            Do not shave 48 hours prior to surgery.  Men may shave face and neck.            Do not bring valuables to the hospital.            [facility] is not responsible for any belongings or valuables.  Do NOT Smoke (Tobacco/Vaping) 24 hours prior to your procedure If you use a CPAP at night, you may bring all equipment for your overnight stay.   Contacts, glasses, dentures or bridgework may not be worn into surgery.      For patients admitted to the hospital, discharge time will be determined by your treatment team.   Patients discharged the day of surgery will not be allowed to drive home, and someone needs to stay with them for 24 hours.    Special instructions:   [place]- Preparing For Surgery  Before surgery, you can play an important role. Because skin is not sterile, your skin needs to be as free of germs as possible. You can reduce the number of germs on your skin by washing with CHG (chlorahexidine gluconate) Soap before surgery.  CHG is an antiseptic cleaner which kills germs and bonds with the skin to continue killing germs even after washing.    Oral Hygiene is also important to reduce your risk of infection.  Remember - BRUSH YOUR TEETH THE MORNING OF SURGERY WITH YOUR REGULAR TOOTHPASTE  Please do not use if you have an allergy to CHG or antibacterial soaps. If your skin becomes reddened/irritated stop  using the CHG.  Do not shave (including legs and underarms) for at least 48 hours prior to first CHG shower. It is OK to shave your face.  Please follow these instructions carefully.   Shower the NIGHT BEFORE SURGERY and the MORNING OF SURGERY with DIAL Soap.   Pat yourself dry with a CLEAN TOWEL.  Wear CLEAN PAJAMAS to bed the night before surgery  Place CLEAN SHEETS on your bed the night of your first shower and DO NOT SLEEP WITH PETS.   Day of Surgery: Please shower morning of surgery  Wear Clean/Comfortable clothing the morning of surgery Do not apply any deodorants/lotions.  Remember to brush your teeth WITH YOUR REGULAR TOOTHPASTE.   Questions were answered. Patient verbalized understanding of instructions.

## 2024-09-22 NOTE — Anesthesia Preprocedure Evaluation (Addendum)
 Anesthesia Evaluation  Patient identified by MRN, date of birth, ID band Patient awake    Reviewed: Allergy & Precautions, NPO status , Patient's Chart, lab work & pertinent test results  History of Anesthesia Complications Negative for: history of anesthetic complications  Airway Mallampati: I  TM Distance: >3 FB Neck ROM: full    Dental  (+) Edentulous Upper, Edentulous Lower   Pulmonary shortness of breath, asthma , COPD,  oxygen  dependent, Recent URI , Residual Cough, Current Smoker and Patient abstained from smoking.   Pulmonary exam normal        Cardiovascular hypertension, On Medications (-) angina Normal cardiovascular exam     Neuro/Psych  Headaches PSYCHIATRIC DISORDERS Anxiety        GI/Hepatic Neg liver ROS,GERD  ,,  Endo/Other  diabetes, Type 2    Renal/GU Renal disease     Musculoskeletal   Abdominal   Peds  Hematology negative hematology ROS (+)   Anesthesia Other Findings Past Medical History: No date: Angina 10/15/2015: Anxiety state No date: ARDS (adult respiratory distress syndrome) (HCC)     Comment:  Jan 2011 No date: Asthma No date: Chronic back pain No date: COPD (chronic obstructive pulmonary disease) (HCC) No date: Diabetes mellitus No date: Gallstones with biliary obstruction 12/21/2012: GERD (gastroesophageal reflux disease) 10/15/2015: HTN (hypertension) No date: Hyperglycemia, drug-induced     Comment:  steroid induced hyperglycemia No date: Migraine headache No date: On home O2 No date: Pneumonia No date: Recurrent upper respiratory infection (URI) No date: Shortness of breath  Past Surgical History: 01/04/2024: BILIARY DILATION     Comment:  Procedure: BILIARY DILATION;  Surgeon: Jinny Carmine, MD;              Location: [facility] ENDOSCOPY;  Service: Endoscopy;; No date: c-section 01/04/2024: ENDOSCOPIC RETROGRADE CHOLANGIOPANCREATOGRAPHY (ERCP) WITH  PROPOFOL ; N/A      Comment:  Procedure: ENDOSCOPIC RETROGRADE               CHOLANGIOPANCREATOGRAPHY (ERCP) WITH PROPOFOL ;  Surgeon:               Jinny Carmine, MD;  Location: [facility] ENDOSCOPY;  Service:               Endoscopy;  Laterality: N/A; 01/04/2024: REMOVAL OF STONES     Comment:  Procedure: REMOVAL OF STONES;  Surgeon: Jinny Carmine,               MD;  Location: [facility] ENDOSCOPY;  Service: Endoscopy;; No date: TRACHEOSTOMY     Comment:  decannulated 12/2009 No date: TUBAL LIGATION No date: uterine ablasion     Reproductive/Obstetrics negative OB ROS                              Anesthesia Physical Anesthesia Plan  ASA: 3  Anesthesia Plan: General ETT   Post-op Pain Management: Minimal or no pain anticipated   Induction: Intravenous  PONV Risk Score and Plan: 2 and Ondansetron , Midazolam , Treatment may vary due to age or medical condition and Propofol  infusion  Airway Management Planned: Oral ETT  Additional Equipment:   Intra-op Plan:   Post-operative Plan: Extubation in OR  Informed Consent: I have reviewed the patients History and Physical, chart, labs and discussed the procedure including the risks, benefits and alternatives for the proposed anesthesia with the patient or authorized representative who has indicated his/her understanding and acceptance.     Dental Advisory Given  Plan Discussed  with: Anesthesiologist, CRNA and Surgeon  Anesthesia Plan Comments: (PAT note by Lynwood Hope, PA-C: 58 y.o. female with medical history significant for current smoker, COPD, GERD on PPI, chronic respiratory failure on 3 L, uncontrolled IDDM2, hypertension, chronic pain.   Recent admission 9/14 through 08/11/2024 for COPD exacerbation.  Had outpatient follow-up with pulmonology on 09/08/2024 and again noted to be in exacerbation, prescribed Z-Pak and prednisone  taper.  She was also recommended to proceed with bronchoscopy and biopsy for hypermetabolic left upper lobe  nodule.  She was continued on Pulmicort , Brovana , Yupelri .  Patient reports that she did not take recent prednisone  taper due to uncontrolled blood sugar.  States blood sugar has been consistently over 200 with some readings over 500.  Reports fasting blood sugar 280 today.  She expressed concern about reducing Lantus  dose the night before surgery per anesthesia instructions.  She she was advised to reach out to her prescriber for additional input on perioperative blood sugar management.  She will need day of surgery labs and evaluation.  EKG 08/06/2024: Sinus tachycardia.  Rate 114. Right atrial enlargement. Right bundle branch block. Probable inferior infarct, recent. Lateral leads are also involved.  No significant change.  )         Anesthesia Quick Evaluation

## 2024-09-22 NOTE — Progress Notes (Signed)
 Anesthesia Chart Review: Same day workup  58 y.o. female with medical history significant for current smoker, COPD, GERD on PPI, chronic respiratory failure on 3 L, uncontrolled IDDM2, hypertension, chronic pain.   Recent admission 9/14 through 08/11/2024 for COPD exacerbation.  Had outpatient follow-up with pulmonology on 09/08/2024 and again noted to be in exacerbation, prescribed Z-Pak and prednisone  taper.  She was also recommended to proceed with bronchoscopy and biopsy for hypermetabolic left upper lobe nodule.  She was continued on Pulmicort , Brovana , Yupelri .  Patient reports that she did not take recent prednisone  taper due to uncontrolled blood sugar.  States blood sugar has been consistently over 200 with some readings over 500.  Reports fasting blood sugar 280 today.  She expressed concern about reducing Lantus  dose the night before surgery per anesthesia instructions.  She she was advised to reach out to her prescriber for additional input on perioperative blood sugar management.  She will need day of surgery labs and evaluation.  EKG 08/06/2024: Sinus tachycardia.  Rate 114. Right atrial enlargement. Right bundle branch block. Probable inferior infarct, recent. Lateral leads are also involved.  No significant change.    Zoiee, [facility] Short Stay Center/Anesthesiology Phone [phone] 09/22/2024 4:17 PM

## 2024-09-25 ENCOUNTER — Ambulatory Visit (HOSPITAL_COMMUNITY)

## 2024-09-25 ENCOUNTER — Ambulatory Visit (HOSPITAL_COMMUNITY): Payer: Self-pay | Admitting: Physician Assistant

## 2024-09-25 ENCOUNTER — Encounter (HOSPITAL_COMMUNITY)
Admission: RE | Disposition: A | Discharge status: Home / Self Care | Payer: Self-pay | Source: Home / Self Care | Attending: Emergency Medicine

## 2024-09-25 ENCOUNTER — Ambulatory Visit (HOSPITAL_COMMUNITY)
Admission: RE | Admit: 2024-09-25 | Discharge: 2024-09-25 | Disposition: A | Discharge status: Home / Self Care | Attending: Emergency Medicine | Admitting: Emergency Medicine

## 2024-09-25 ENCOUNTER — Encounter (HOSPITAL_COMMUNITY): Payer: Self-pay | Admitting: Emergency Medicine

## 2024-09-25 DIAGNOSIS — Z9981 Dependence on supplemental oxygen: Secondary | ICD-10-CM | POA: Insufficient documentation

## 2024-09-25 DIAGNOSIS — Z7984 Long term (current) use of oral hypoglycemic drugs: Secondary | ICD-10-CM | POA: Insufficient documentation

## 2024-09-25 DIAGNOSIS — E119 Type 2 diabetes mellitus without complications: Secondary | ICD-10-CM | POA: Insufficient documentation

## 2024-09-25 DIAGNOSIS — R911 Solitary pulmonary nodule: Secondary | ICD-10-CM | POA: Diagnosis not present

## 2024-09-25 DIAGNOSIS — C3412 Malignant neoplasm of upper lobe, left bronchus or lung: Secondary | ICD-10-CM | POA: Diagnosis not present

## 2024-09-25 DIAGNOSIS — Z794 Long term (current) use of insulin: Secondary | ICD-10-CM | POA: Insufficient documentation

## 2024-09-25 DIAGNOSIS — I10 Essential (primary) hypertension: Secondary | ICD-10-CM

## 2024-09-25 DIAGNOSIS — F17210 Nicotine dependence, cigarettes, uncomplicated: Secondary | ICD-10-CM | POA: Diagnosis not present

## 2024-09-25 DIAGNOSIS — J449 Chronic obstructive pulmonary disease, unspecified: Secondary | ICD-10-CM | POA: Insufficient documentation

## 2024-09-25 DIAGNOSIS — J9611 Chronic respiratory failure with hypoxia: Secondary | ICD-10-CM | POA: Insufficient documentation

## 2024-09-25 DIAGNOSIS — R918 Other nonspecific abnormal finding of lung field: Secondary | ICD-10-CM

## 2024-09-25 HISTORY — PX: BRONCHIAL BIOPSY: SHX5109

## 2024-09-25 HISTORY — PX: BRONCHIAL NEEDLE ASPIRATION BIOPSY: SHX5106

## 2024-09-25 HISTORY — PX: VIDEO BRONCHOSCOPY WITH ENDOBRONCHIAL NAVIGATION: SHX6175

## 2024-09-25 LAB — CBC
HCT: 42.9 % (ref 36.0–46.0)
Hemoglobin: 13.1 g/dL (ref 12.0–15.0)
MCH: 27.3 pg (ref 26.0–34.0)
MCHC: 30.5 g/dL (ref 30.0–36.0)
MCV: 89.4 fL (ref 80.0–100.0)
Platelets: 245 K/uL (ref 150–400)
RBC: 4.8 MIL/uL (ref 3.87–5.11)
RDW: 13 % (ref 11.5–15.5)
WBC: 11.4 K/uL — ABNORMAL HIGH (ref 4.0–10.5)
nRBC: 0 % (ref 0.0–0.2)

## 2024-09-25 LAB — BASIC METABOLIC PANEL WITH GFR
Anion gap: 10 (ref 5–15)
BUN: 8 mg/dL (ref 6–20)
CO2: 38 mmol/L — ABNORMAL HIGH (ref 22–32)
Calcium: 8.9 mg/dL (ref 8.9–10.3)
Chloride: 91 mmol/L — ABNORMAL LOW (ref 98–111)
Creatinine, Ser: 0.46 mg/dL (ref 0.44–1.00)
GFR, Estimated: >60 mL/min (ref >60)
Glucose, Bld: 201 mg/dL — ABNORMAL HIGH (ref 70–99)
Potassium: 4 mmol/L (ref 3.5–5.1)
Sodium: 139 mmol/L (ref 135–145)

## 2024-09-25 LAB — GLUCOSE, CAPILLARY
Glucose-Capillary: 224 mg/dL — ABNORMAL HIGH (ref 70–99)
Glucose-Capillary: 156 mg/dL — ABNORMAL HIGH (ref 70–99)

## 2024-09-25 LAB — POCT PREGNANCY, URINE: Preg Test, Ur: NEGATIVE

## 2024-09-25 SURGERY — VIDEO BRONCHOSCOPY WITH ENDOBRONCHIAL NAVIGATION
Anesthesia: General | Laterality: Left

## 2024-09-25 MED ORDER — PHENYLEPHRINE 80 MCG/ML (10ML) SYRINGE FOR IV PUSH (FOR BLOOD PRESSURE SUPPORT)
PREFILLED_SYRINGE | INTRAVENOUS | Status: DC | PRN
  Administered 2024-09-25 (×3): 80 ug via INTRAVENOUS

## 2024-09-25 MED ORDER — CHLORHEXIDINE GLUCONATE 0.12 % MT SOLN
15.0000 mL | OROMUCOSAL | Status: AC
  Administered 2024-09-25: 15 mL via OROMUCOSAL
  Filled 2024-09-25: qty 15

## 2024-09-25 MED ORDER — LACTATED RINGERS IV SOLN
INTRAVENOUS | Status: DC
Start: 2024-09-25 — End: 2024-09-25

## 2024-09-25 MED ORDER — OXYCODONE HCL 5 MG/5ML PO SOLN
5.0000 mg | Freq: Once | ORAL | Status: DC | PRN
  Filled 2024-09-25: qty 5

## 2024-09-25 MED ORDER — SUGAMMADEX SODIUM 200 MG/2ML IV SOLN
INTRAVENOUS | Status: DC | PRN
  Administered 2024-09-25: 400 mg via INTRAVENOUS

## 2024-09-25 MED ORDER — PROPOFOL 10 MG/ML IV BOLUS
INTRAVENOUS | Status: DC | PRN
  Administered 2024-09-25: 140 mg via INTRAVENOUS
  Administered 2024-09-25: 20 mg via INTRAVENOUS

## 2024-09-25 MED ORDER — AMISULPRIDE (ANTIEMETIC) 5 MG/2ML IV SOLN: 10.0000 mg | Freq: Once | INTRAVENOUS | Status: DC | PRN

## 2024-09-25 MED ORDER — LIDOCAINE 2% (20 MG/ML) 5 ML SYRINGE
INTRAMUSCULAR | Status: DC | PRN
  Administered 2024-09-25: 60 mg via INTRAVENOUS
  Administered 2024-09-25: 10 mg via INTRAVENOUS

## 2024-09-25 MED ORDER — ROCURONIUM BROMIDE 10 MG/ML (PF) SYRINGE
PREFILLED_SYRINGE | INTRAVENOUS | Status: DC | PRN
  Administered 2024-09-25: 40 mg via INTRAVENOUS
  Administered 2024-09-25: 10 mg via INTRAVENOUS

## 2024-09-25 MED ORDER — IPRATROPIUM-ALBUTEROL 0.5-2.5 (3) MG/3ML IN SOLN
RESPIRATORY_TRACT | Status: AC
  Filled 2024-09-25: qty 3

## 2024-09-25 MED ORDER — IPRATROPIUM-ALBUTEROL 0.5-2.5 (3) MG/3ML IN SOLN
RESPIRATORY_TRACT | Status: DC | PRN
  Administered 2024-09-25: 3 mL via RESPIRATORY_TRACT

## 2024-09-25 MED ORDER — INSULIN ASPART 100 UNIT/ML IJ SOLN
INTRAMUSCULAR | Status: AC
  Administered 2024-09-25: 6 [IU] via SUBCUTANEOUS
  Filled 2024-09-25: qty 1

## 2024-09-25 MED ORDER — INSULIN ASPART 100 UNIT/ML IJ SOLN: 0.0000 [IU] | INTRAMUSCULAR | Status: DC | PRN

## 2024-09-25 MED ORDER — HYDROMORPHONE HCL 1 MG/ML IJ SOLN: 0.2500 mg | INTRAMUSCULAR | Status: DC | PRN

## 2024-09-25 MED ORDER — ONDANSETRON HCL 4 MG/2ML IJ SOLN
INTRAMUSCULAR | Status: DC | PRN
  Administered 2024-09-25: 4 mg via INTRAVENOUS

## 2024-09-25 MED ORDER — PANTOPRAZOLE SODIUM 40 MG PO TBEC: 40.0000 mg | DELAYED_RELEASE_TABLET | ORAL | Status: AC

## 2024-09-25 MED ORDER — OXYCODONE HCL 5 MG PO TABS
5.0000 mg | ORAL_TABLET | Freq: Once | ORAL | Status: DC | PRN
  Filled 2024-09-25: qty 1

## 2024-09-25 MED ORDER — ALBUTEROL SULFATE HFA 108 (90 BASE) MCG/ACT IN AERS
INHALATION_SPRAY | RESPIRATORY_TRACT | Status: DC | PRN
  Administered 2024-09-25: 8 via RESPIRATORY_TRACT

## 2024-09-25 SURGICAL SUPPLY — 37 items
ADAPTER BRONCHOSCOPE OLYMPUS (ADAPTER) ×2 IMPLANT
ADAPTER VALVE BIOPSY EBUS (MISCELLANEOUS) IMPLANT
BAG COUNTER SPONGE SURGICOUNT (BAG) ×2 IMPLANT
BRUSH CYTOL CELLEBRITY 1.5X140 (MISCELLANEOUS) ×2 IMPLANT
BRUSH SUPERTRAX BIOPSY (INSTRUMENTS) IMPLANT
BRUSH SUPERTRAX NDL-TIP CYTO (INSTRUMENTS) ×2 IMPLANT
CANISTER SUCTION 3000ML PPV (SUCTIONS) ×2 IMPLANT
CNTNR URN SCR LID CUP LEK RST (MISCELLANEOUS) ×2 IMPLANT
COVER BACK TABLE 60X90IN (DRAPES) ×2 IMPLANT
FILTER STRAW FLUID ASPIR (MISCELLANEOUS) IMPLANT
FORCEPS BIOP 1.5 SINGLE USE (MISCELLANEOUS) ×2 IMPLANT
FORCEPS BIOP SUPERTRX PREMAR (INSTRUMENTS) ×2 IMPLANT
GAUZE SPONGE 4X4 12PLY STRL (GAUZE/BANDAGES/DRESSINGS) ×2 IMPLANT
GLOVE BIO SURGEON STRL SZ7.5 (GLOVE) ×4 IMPLANT
GOWN STRL REUS W/ TWL LRG LVL3 (GOWN DISPOSABLE) ×4 IMPLANT
KIT CLEAN ENDO COMPLIANCE (KITS) ×2 IMPLANT
KIT LOCATABLE GUIDE (CANNULA) IMPLANT
KIT MARKER FIDUCIAL DELIVERY (KITS) IMPLANT
KIT TURNOVER KIT B (KITS) ×2 IMPLANT
MARKER SKIN DUAL TIP RULER LAB (MISCELLANEOUS) ×2 IMPLANT
NDL SUPERTRX PREMARK BIOPSY (NEEDLE) ×2 IMPLANT
NEEDLE SUPERTRX PREMARK BIOPSY (NEEDLE) ×2 IMPLANT
OIL SILICONE PENTAX (PARTS (SERVICE/REPAIRS)) ×2 IMPLANT
PAD ARMBOARD POSITIONER FOAM (MISCELLANEOUS) ×4 IMPLANT
PATCHES PATIENT (LABEL) ×6 IMPLANT
SOLN 0.9% NACL POUR BTL 1000ML (IV SOLUTION) ×2 IMPLANT
SOLN STERILE WATER BTL 1000 ML (IV SOLUTION) ×2 IMPLANT
SYR 20ML ECCENTRIC (SYRINGE) ×2 IMPLANT
SYR 20ML LL LF (SYRINGE) ×2 IMPLANT
SYR 50ML SLIP (SYRINGE) ×2 IMPLANT
SuperLock IMPLANT
TOWEL GREEN STERILE FF (TOWEL DISPOSABLE) ×2 IMPLANT
TRAP SPECIMEN MUCUS 40CC (MISCELLANEOUS) IMPLANT
TUBE CONNECTING 20X1/4 (TUBING) ×2 IMPLANT
UNDERPAD 30X36 HEAVY ABSORB (UNDERPADS AND DIAPERS) ×2 IMPLANT
VALVE BIOPSY SINGLE USE (MISCELLANEOUS) ×2 IMPLANT
VALVE SUCTION BRONCHIO DISP (MISCELLANEOUS) ×2 IMPLANT

## 2024-09-25 NOTE — Discharge Instructions (Signed)

## 2024-09-25 NOTE — Op Note (Signed)
 Procedure Note  Patient: [name]  [employer] Cios mobile C-arm was utilized to identify and biopsy left upper lobe nodule.  Needle-in-lesion was confirmed using real-time Cios imaging, and images were uploaded to PACS.      Lamar Chris, MD, PhD 09/25/2024, 3:05 PM [facility] [phone] or if no answer before 7:00PM call [phone] For any issues after 7:00PM please call eLink [phone]

## 2024-09-25 NOTE — Op Note (Signed)
 Video Bronchoscopy with Robotic Assisted Bronchoscopic Navigation   Date of Operation: 09/25/2024   Pre-op Diagnosis: Left upper lobe nodules  Post-op Diagnosis: Same  Surgeon: Lamar Chris  Assistants: None  Anesthesia: General endotracheal anesthesia  Operation: Flexible video fiberoptic bronchoscopy with robotic assistance and biopsies.  Estimated Blood Loss: Minimal  Complications: None  Indications and History: [name] is a 58 y.o. female with history of tobacco use and COPD.  She had pulmonary nodules identified on lung cancer screening CT scan of the chest 07/2024.  Subsequent PET scan confirmed hypermetabolism in an enlarging peripheral left upper lobe pulmonary nodule.  She also had a more central 7 mm nodule.  Recommendation made to achieve a tissue diagnosis via robotic assisted navigational bronchoscopy.  The risks, benefits, complications, treatment options and expected outcomes were discussed with the patient.  The possibilities of pneumothorax, pneumonia, reaction to medication, pulmonary aspiration, perforation of a viscus, bleeding, failure to diagnose a condition and creating a complication requiring transfusion or operation were discussed with the patient who freely signed the consent.    Description of Procedure: The patient was seen in the Preoperative Area, was examined and was deemed appropriate to proceed.  The patient was taken to [facility] Endoscopy room 3, identified as [name] and the procedure verified as Flexible Video Fiberoptic Bronchoscopy.  A Time Out was held and the above information confirmed.   Prior to the date of the procedure a high-resolution CT scan of the chest was performed. Utilizing ION software program a virtual tracheobronchial tree was generated to allow the creation of distinct navigation pathways to the patient's parenchymal abnormalities. After being taken to the operating room general anesthesia was initiated and the patient  was  orally intubated. The video fiberoptic bronchoscope was introduced via the endotracheal tube and a general inspection was performed which showed normal right and left lung anatomy.  There was collapsibility of the large airways with expiration.  There was some thick clear mucus present bilaterally.  Aspiration of the bilateral mainstems was completed to remove any remaining secretions. Robotic catheter inserted into patient's endotracheal tube.   Target #1 left upper lobe pulmonary nodule: The distinct navigation pathways prepared prior to this procedure were then utilized to navigate to patient's lesion identified on CT scan. The robotic catheter was secured into place and the vision probe was withdrawn.  Lesion location was approximated using fluoroscopy.  Local registration and targeting was performed using [employer] Cios mobile C-arm three-dimensional imaging. Under fluoroscopic guidance transbronchial needle biopsies and transbronchial forceps biopsies were performed to be sent for cytology and pathology.  Needle-in-lesion was confirmed using Cios mobile C-arm. Under fluoroscopic guidance a single fiducial marker was placed adjacent to the nodule.   At the end of the procedure a general airway inspection was performed and there was no evidence of active bleeding. The bronchoscope was removed.  The patient tolerated the procedure well. There was no significant blood loss and there were no obvious complications. A post-procedural chest x-ray is pending.  Samples Target #1: 1. Transbronchial Wang needle biopsies from left upper lobe nodule 2. Transbronchial forceps biopsies from left upper lobe nodule    Plans:  The patient will be discharged from the PACU to home when recovered from anesthesia and after chest x-ray is reviewed. We will review the cytology, pathology and microbiology results with the patient when they become available. Outpatient followup will be with Joyce Lites, NP and [name].    Lamar Chris,  MD, PhD 09/25/2024, 2:59 PM [facility] [phone] or if no answer before 7:00PM call [phone] For any issues after 7:00PM please call eLink [phone]

## 2024-09-25 NOTE — Anesthesia Procedure Notes (Signed)
 Procedure Name: Intubation Date/Time: 09/25/2024 2:12 PM  Performed by: Dianne Burnard RAMAN, CRNAPre-anesthesia Checklist: Patient identified, Emergency Drugs available, Suction available and Patient being monitored Patient Re-evaluated:Patient Re-evaluated prior to induction Oxygen  Delivery Method: Circle system utilized Preoxygenation: Pre-oxygenation with 100% oxygen  Induction Type: IV induction Ventilation: Mask ventilation without difficulty Laryngoscope Size: McGrath and 3 Grade View: Grade I Tube type: Oral Tube size: 8.0 mm Number of attempts: 1 Airway Equipment and Method: Stylet and Oral airway Placement Confirmation: ETT inserted through vocal cords under direct vision, positive ETCO2 and breath sounds checked- equal and bilateral Secured at: 21 cm Tube secured with: Tape Dental Injury: Teeth and Oropharynx as per pre-operative assessment  Comments: 2011 ARDS with tracheostomy,  today 8.0 ETT placed with ease.

## 2024-09-25 NOTE — Interval H&P Note (Signed)
 History and Physical Interval Note:  09/25/2024 1:34 PM  [name]  has presented today for surgery, with the diagnosis of lung nodules.  The various methods of treatment have been discussed with the patient and family. After consideration of risks, benefits and other options for treatment, the patient has consented to  Procedure(s): VIDEO BRONCHOSCOPY WITH ENDOBRONCHIAL NAVIGATION (Left) as a surgical intervention.  The patient's history has been reviewed, patient examined, no change in status, stable for surgery.  I have reviewed the patient's chart and labs.  Questions were answered to the patient's satisfaction.     Lamar GORMAN Chris

## 2024-09-25 NOTE — Transfer of Care (Signed)
 Immediate Anesthesia Transfer of Care Note  Patient: [name]  Procedure(s) Performed: VIDEO BRONCHOSCOPY WITH ENDOBRONCHIAL NAVIGATION (Left) BRONCHOSCOPY, WITH NEEDLE ASPIRATION BIOPSY BRONCHOSCOPY, WITH BIOPSY  Patient Location: PACU  Anesthesia Type:General  Level of Consciousness: awake, alert , and oriented  Airway & Oxygen  Therapy: Patient Spontanous Breathing and Patient connected to face mask oxygen   Post-op Assessment: Report given to RN and Post -op Vital signs reviewed and stable  Post vital signs: Reviewed and stable  Last Vitals:  Vitals Value Taken Time  BP 138/97 09/25/24 15:18  Temp    Pulse 102 09/25/24 15:24  Resp 13 09/25/24 15:20  SpO2 96 % 09/25/24 15:24  Vitals shown include unfiled device data.  Last Pain:  Vitals:   09/25/24 1307  TempSrc:   PainSc: 5       Patients Stated Pain Goal: 5 (09/25/24 1307)  Complications: No notable events documented.

## 2024-09-26 ENCOUNTER — Encounter (HOSPITAL_COMMUNITY): Payer: Self-pay | Admitting: Emergency Medicine

## 2024-09-26 NOTE — Anesthesia Postprocedure Evaluation (Signed)
 Anesthesia Post Note  Patient: Sianni A Halbert  Procedure(s) Performed: VIDEO BRONCHOSCOPY WITH ENDOBRONCHIAL NAVIGATION (Left) BRONCHOSCOPY, WITH NEEDLE ASPIRATION BIOPSY BRONCHOSCOPY, WITH BIOPSY     Patient location during evaluation: PACU Anesthesia Type: General Level of consciousness: awake and alert Pain management: pain level controlled Vital Signs Assessment: post-procedure vital signs reviewed and stable Respiratory status: spontaneous breathing, nonlabored ventilation, respiratory function stable and patient connected to nasal cannula oxygen  Cardiovascular status: blood pressure returned to baseline and stable Postop Assessment: no apparent nausea or vomiting Anesthetic complications: no   No notable events documented.  Last Vitals:  Vitals:   09/25/24 1600 09/25/24 1700  BP: 98/64   Pulse: 97   Resp: 10   Temp: (!) 36.2 C   SpO2: 95% 94%    Last Pain:  Vitals:   09/25/24 1600  TempSrc:   PainSc: 0-No pain                 Epifanio Lamar BRAVO

## 2024-09-27 LAB — CYTOLOGY - NON PAP

## 2024-10-02 ENCOUNTER — Encounter: Payer: Self-pay | Admitting: Acute Care

## 2024-10-02 ENCOUNTER — Ambulatory Visit: Admitting: Acute Care

## 2024-10-02 VITALS — BP 118/80 | HR 106 | Temp 99.2°F | Ht 60.0 in | Wt 149.0 lb

## 2024-10-02 DIAGNOSIS — F17210 Nicotine dependence, cigarettes, uncomplicated: Secondary | ICD-10-CM

## 2024-10-02 DIAGNOSIS — J9611 Chronic respiratory failure with hypoxia: Secondary | ICD-10-CM

## 2024-10-02 DIAGNOSIS — J441 Chronic obstructive pulmonary disease with (acute) exacerbation: Secondary | ICD-10-CM

## 2024-10-02 DIAGNOSIS — C3412 Malignant neoplasm of upper lobe, left bronchus or lung: Secondary | ICD-10-CM

## 2024-10-02 DIAGNOSIS — R911 Solitary pulmonary nodule: Secondary | ICD-10-CM

## 2024-10-02 DIAGNOSIS — C3492 Malignant neoplasm of unspecified part of left bronchus or lung: Secondary | ICD-10-CM

## 2024-10-02 DIAGNOSIS — R942 Abnormal results of pulmonary function studies: Secondary | ICD-10-CM

## 2024-10-02 DIAGNOSIS — Z720 Tobacco use: Secondary | ICD-10-CM

## 2024-10-02 DIAGNOSIS — F419 Anxiety disorder, unspecified: Secondary | ICD-10-CM | POA: Diagnosis not present

## 2024-10-02 DIAGNOSIS — Z9889 Other specified postprocedural states: Secondary | ICD-10-CM

## 2024-10-02 DIAGNOSIS — R9389 Abnormal findings on diagnostic imaging of other specified body structures: Secondary | ICD-10-CM

## 2024-10-02 DIAGNOSIS — J449 Chronic obstructive pulmonary disease, unspecified: Secondary | ICD-10-CM

## 2024-10-02 DIAGNOSIS — C3490 Malignant neoplasm of unspecified part of unspecified bronchus or lung: Secondary | ICD-10-CM

## 2024-10-02 MED ORDER — AZITHROMYCIN 250 MG PO TABS: ORAL_TABLET | ORAL | 0 refills | Status: AC

## 2024-10-02 MED ORDER — LORAZEPAM 0.5 MG PO TABS: 1.0000 mg | ORAL_TABLET | Freq: Two times a day (BID) | ORAL | 0 refills | Status: AC

## 2024-10-02 MED ORDER — PREDNISONE 10 MG PO TABS: ORAL_TABLET | ORAL | 0 refills | Status: AC

## 2024-10-02 NOTE — Patient Instructions (Addendum)
 It is good to see you today. I am glad you have done well after your procedure.  We have reviewed your biopsy results.  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 This is a non small cell lung cancer.  I have referred you to radiation oncology and medical oncology. I have also ordered an MRI of the brain. I have sent in a one time dose of Ativan  1 mg . Take just before the scan to help with anxiety. You will get calls to get these appointments scheduled.  Call us  if you need us . You will get great care at the cancer center.  We will schedule follow up for your COPD in [place] office in 3 months. I have sent in a z pack . Take 2 today and one daily for the next 4 days. I have also sent in a prednisone  taper. Prednisone  taper; 10 mg tablets: 4 tabs x 2 days, 3 tabs x 2 days, 2 tabs x 2 days 1 tab x 2 days then stop.  If you need to be seen sooner, just call.  Please contact office for sooner follow up if symptoms do not improve or worsen or seek emergency care

## 2024-10-02 NOTE — Progress Notes (Signed)
 History of Present Illness [name] is a 58 y.o. female current every day smoker referred for abnormal chest imaging 07/2024. She will be followed by Dr. Shelah.   Synopsis Pt. Had an abnormal lung cancer screening scan 07/2024. Read as a LR 4 A, but the  left upper lobe pulmonary nodule nodule had grown 8.9 to 13 mm in 3 months. PET scan was ordered. PET scan scan showed a 12 mm left apical pleural based hypermetabolic pulmonary nodule that was highly suspicious for stage Ia primary bronchogenic carcinoma. SUV max of the dominant left apical pleural-based pulmonary nodule was 4.7. There is a more central left apical nodule measuring 7 mm with an SUV of 1.4. This nodule is at the low end of PET resolution.  Next best step was for a bronchoscopy with biopsy for tissue diagnosis vs a 3 month follow up scan. She opted for tissue sampling. She underwent a navigational bronchoscopy with biopsies on 09/25/2024. She is here to review the results of the biopsy and to have her post bronch follow up exam.   10/02/2024 Discussed the use of AI scribe software for clinical note transcription with the patient, who gave verbal consent to proceed.  History of Present Illness  Pt. Presents for follow up after bronchoscopy with biopsies of a left upper lobe pulmonary nodule.She states she has done well, no bleeding, fever, chills, worsening dyspnea or discolored secretions. No adverse reaction to anesthesia.   She feels unwell and fatigued following a recent procedure, with a desire to sleep. She is uncertain if these symptoms are due to the procedure or her efforts to quit smoking. She has been using nicotine  mints to aid in smoking cessation but continues to smoke, albeit less than before. She experiences significant withdrawal symptoms.  No significant bleeding post-procedure, though she coughed up a small amount of blood twice. She has a low-grade fever, which she attributes to a concurrent head cold, with  clear nasal secretions. No worsening of her baseline shortness of breath and no adverse reactions to anesthesia aside from sleepiness and mild nausea a few days post-procedure.Secretions are clear.  We have reviewed the results of the biopsy together.  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 She reports being told that her PET scan showed a nodule in the left upper lobe and one other questionable area. She has not undergone pulmonary function testing previously. She is concerned about surgical options due to her current respiratory status and past experiences with anesthesia.She has chronic hypoxic respiratory failure, and wears oxygen  at 2 L continuously.She does not want a referral to surgery. I will refer to both radiation oncology and medical oncology for consideration for treatment.  She has a history of COPD and is currently using Symbicort , and albuterol  as needed. She uses her albuterol  several times a day. She reports wheezing and has been using albuterol  treatments. No colored sputum, noting it is white. She is allergic to penicillin and Levaquin , and has previously taken a Z-Pak without significant improvement. I will treat her today for a COPD exacerbation with a z pack and a prednisone  taper.   Her social history includes a past surgery on her right ear at age 38 for a disease that had a potential to spread to her brain.  She is currently not on any pain medication but has used lorazepam  for anxiety  related to medical procedures.I have sent in a one time dose of ativan  for the MRI brain.      Test Results: 09/25/2024 Cytology FINAL MICROSCOPIC DIAGNOSIS:  A.  LEFT LUNG, UPPER LOBE, NODULE, FINE NEEDLE ASPIRATION:  - Malignant  - Squamous cell carcinoma   PET Scan  08/28/2024 12 mm left apical pleural-based Hypermetabolic Pulmonary Nodule, highly suspicious for  stage 1a primary bronchogenic carcinoma. 2. More cephalad and central 7mm left upper lobe pulmonary nodule is at the low end of PET resolution and indeterminate. 3. 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 4. Aortic atherosclerosis (icd10-i70.0) and emphysema (icd10-j43.9).     LDCT 07/27/2024 Mediastinum/Nodes: Imaged thyroid gland without nodules meeting criteria for imaging follow-up by size. Normal esophagus. Increased 13 mm right hilar lymph node (2:28), previously 10 mm   Lungs/Pleura: The central airways are patent. Mild centrilobular and paraseptal emphysema. Mild diffuse bronchiectasis with increased diffuse bronchial wall thickening and extensive new and increasing tree-in-bud nodularity throughout both lungs. Previously noted medial left apical nodule has increased in size to 13.0 mm (4:81), previously 8.9 mm. Adjacent 8.2 mm nodule (4:68) is also increased in size. No pneumothorax. No pleural effusion.  Lung-RADS 4B, suspicious. Additional imaging evaluation or consultation with Pulmonology or Thoracic Surgery recommended. Interval increase in size of left apical nodules measuring up to 13.0 mm. 2. Increased diffuse bronchial wall thickening and extensive new and increasing tree-in-bud nodularity throughout both lungs, consistent with worsening infectious/inflammatory bronchiolitis. 3. Increased size of right hilar lymph node, indeterminate, but likely reactive. 4. Aortic Atherosclerosis (ICD10-I70.0) and Emphysema (ICD10-J43.9).   LDCT Chest 04/14/2024 Lungs/Pleura: Centrilobular and paraseptal emphysema evident. There is diffuse bronchial wall thickening and irregularity with scattered ground-glass and confluent nodular opacities in both lungs. Dominant confluent nodular opacities in the medial left upper lobe measuring 8.9 mm (image 69). No pleural effusion. IMPRESSION: 1. Lung-RADS 4A, suspicious. 8.9 mm confluent nodular opacity in the medial left  upper lobe.Follow up low-dose chest CT without contrast in 3 months (please use the following order, CT CHEST LCS NODULE FOLLOW-UP W/O CM) is recommended. Alternatively, PET may be considered when there is a solid component 8mm or larger. 2. Diffuse bronchial wall thickening and irregularity with scattered ground-glass and confluent nodular opacities in both lungs. 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 8.9 mm confluent nodular opacity of concern in the medial left upper lobe may well be infectious/inflammatory but warrants close follow-up. 3. Aortic Atherosclerosis (ICD10-I70.0) and Emphysema (ICD10-J43.9).      Latest Ref Rng & Units 09/25/2024    1:28 PM 08/07/2024    4:18 AM 08/06/2024    1:50 PM  CBC  WBC 4.0 - 10.5 K/uL 11.4  9.4  13.8   Hemoglobin 12.0 - 15.0 g/dL 86.8  87.7  87.1   Hematocrit 36.0 - 46.0 % 42.9  40.5  41.4   Platelets 150 - 400 K/uL 245  326  303        Latest Ref Rng & Units 09/25/2024    1:28 PM 08/10/2024    4:40 AM 08/07/2024    4:18 AM  BMP  Glucose 70 - 99 mg/dL 798  700  756   BUN 6 - 20 mg/dL 8  19  8    Creatinine 0.44 - 1.00 mg/dL 9.53  9.40  9.59   Sodium 135 - 145 mmol/L 139  134  138   Potassium 3.5 - 5.1 mmol/L 4.0  4.1  4.6   Chloride 98 - 111 mmol/L 91  90  95   CO2 22 - 32 mmol/L 38  33  34   Calcium  8.9 - 10.3 mg/dL 8.9  9.1  8.5     BNP    Component Value Date/Time   BNP 47.0 08/06/2024 1350    ProBNP    Component Value Date/Time   PROBNP 345.0 (H) 03/02/2011 1630    PFT No results found for: FEV1PRE, FEV1POST, FVCPRE, FVCPOST, TLC, DLCOUNC, PREFEV1FVCRT, PSTFEV1FVCRT  DG Chest Port 1 View Result Date: 09/25/2024 EXAM: 1 VIEW(S) XRAY OF THE CHEST 09/25/2024 03:46:00 PM COMPARISON: 08/06/2024 CLINICAL HISTORY: S/P bronchoscopy with biopsy FINDINGS: LUNGS AND  PLEURA: Stable left upper lobe nodule. Chronic, diffuse interstitial prominence. Bibasilar scarring versus atelectasis. No pulmonary edema. No pleural effusion. No pneumothorax. HEART AND MEDIASTINUM: Fiducial marker projecting over superior margin of the aortic arch. No acute abnormality of the cardiac and mediastinal silhouettes. BONES AND SOFT TISSUES: No acute osseous abnormality. IMPRESSION: 1. No pneumothorax identified status post bronchoscopy and biopsy. 2. Stable left upper lobe pulmonary nodule, with new fiducial marker in place. Electronically signed by: Waddell Calk MD 09/25/2024 04:28 PM EST RP Workstation: [id number]   DG C-ARM BRONCHOSCOPY Result Date: 09/25/2024 C-ARM BRONCHOSCOPY: Fluoroscopy was utilized by the requesting physician.  No radiographic interpretation.     Past medical hx Past Medical History:  Diagnosis Date   Angina    Anxiety state 10/15/2015   ARDS (adult respiratory distress syndrome) (HCC)    Jan 2011   Asthma    Chronic back pain    COPD (chronic obstructive pulmonary disease) (HCC)    Diabetes mellitus    Encounter for smoking cessation counseling 02/21/2024   Gallstones with biliary obstruction    GERD (gastroesophageal reflux disease) 12/21/2012   HTN (hypertension) 10/15/2015   Hyperglycemia, drug-induced    steroid induced hyperglycemia   Migraine headache    On home O2    Pneumonia    Recurrent upper respiratory infection (URI)    Shortness of breath      Social History   Tobacco Use   Smoking status: Every Day    Current packs/day: 1.00    Average packs/day: 1 pack/day for 46.9 years (46.9 ttl pk-yrs)    Types: Cigarettes    Start date: 1979   Smokeless tobacco: Never   Tobacco comments:    1 pack a day 09/23/2024 KRD  Vaping Use   Vaping status: Never Used  Substance Use Topics   Alcohol use: No   Drug use: No    Comment: Formerly used cocaine , narcotics, THC    [name] reports that she has been smoking cigarettes. She  started smoking about 46 years ago. She has a 46.9 pack-year smoking history. She has never used smokeless tobacco. She reports that she does not drink alcohol and does not use drugs.  Tobacco Cessation: Ready to quit: Not Answered Counseling given: Not Answered Tobacco comments: 1 pack a day 09/23/2024 KRD Working hard on quitting smoking.   Past surgical hx, Family hx, Social hx all reviewed.  Current Outpatient Medications on File Prior to Visit  Medication Sig   albuterol  (PROVENTIL ) (2.5 MG/3ML) 0.083% nebulizer solution Take 3 mLs (2.5 mg total) by nebulization every 6 (six) hours as needed for wheezing or shortness of breath.   albuterol  (VENTOLIN  HFA) 108 (90 Base) MCG/ACT inhaler Inhale 2 puffs into the lungs every 4 (four) hours as needed for wheezing or shortness of  breath.   glipiZIDE  (GLUCOTROL ) 10 MG tablet Take 20 mg by mouth daily before breakfast.   insulin  aspart (NOVOLOG  FLEXPEN) 100 UNIT/ML FlexPen 3 TIMES A DAY WITH MEALS SUGAR  70-120--no units, 121-150--3 units, 151-200--4 units; 201-250--7 units, 251-300--11 units; 301-350 15 units; 351-400 20 units   LANTUS  SOLOSTAR 100 UNIT/ML Solostar Pen Inject 30 Units into the skin at bedtime.   ondansetron  (ZOFRAN -ODT) 4 MG disintegrating tablet 4mg  ODT q4 hours prn nausea/vomit (Patient taking differently: Take 4 mg by mouth every 4 (four) hours as needed for nausea or vomiting. 4mg  ODT q4 hours prn nausea/vomit)   pantoprazole  (PROTONIX ) 40 MG tablet Take 1 tablet (40 mg total) by mouth every other day.   rOPINIRole  (REQUIP ) 2 MG tablet Take 2 mg by mouth 3 (three) times daily.   rosuvastatin  (CRESTOR ) 40 MG tablet Take 40 mg by mouth daily.   SUMAtriptan  (IMITREX ) 50 MG tablet Take 50 mg by mouth as needed for migraine or headache.   SYMBICORT  80-4.5 MCG/ACT inhaler Inhale 2 puffs into the lungs 2 (two) times daily.   [DISCONTINUED] ARIPiprazole  (ABILIFY ) 5 MG tablet Take 5 mg by mouth daily.     [DISCONTINUED] venlafaxine   (EFFEXOR ) 75 MG tablet Take 75 mg by mouth daily.     No current facility-administered medications on file prior to visit.     Allergies  Allergen Reactions   Levofloxacin  Anaphylaxis    Arm also turned red where IV was place   Penicillins Other (See Comments)    convulsions   Doxycycline  Nausea And Vomiting    Review Of Systems:  Constitutional:   No  weight loss, night sweats,  Fevers, chills, fatigue, or  lassitude.  HEENT:   No headaches,  Difficulty swallowing,  Tooth/dental problems, or  Sore throat,                No sneezing, itching, ear ache, nasal congestion, post nasal drip,   CV:  No chest pain,  Orthopnea, PND, swelling in lower extremities, anasarca, dizziness, palpitations, syncope.   GI  No heartburn, indigestion, abdominal pain, nausea, vomiting, diarrhea, change in bowel habits, loss of appetite, bloody stools.   Resp: + shortness of breath with exertion less at rest.  + excess mucus, + productive cough,  + non-productive cough,  No coughing up of blood.  No change in color of mucus.  ++ wheezing.  No chest wall deformity  Skin: no rash or lesions.  GU: no dysuria, change in color of urine, no urgency or frequency.  No flank pain, no hematuria   MS:  No joint pain or swelling.  No decreased range of motion.  No back pain.  Psych:  No change in mood or affect. No depression or anxiety.  No memory loss.   Vital Signs BP 118/80   Pulse (!) 106   Temp 99.2 F (37.3 C) (Oral)   Ht 5' (1.524 m)   Wt 149 lb (67.6 kg)   SpO2 92% Comment: 3L  BMI 29.10 kg/m    Physical Exam:  Physical Exam GENERAL: No distress, alert and oriented times 3. EARS NOSE THROAT: No sinus tenderness, tympanic membranes clear, pale nasal mucosa, no oral exudate, no post nasal drip, no lymphadenopathy. CHEST: Wheeze present, no rales, dullness, no accessory muscle use, no nasal flaring, no sternal retractions. CARDIAC: S1, S2, regular rate and rhythm, no murmur. ABDOMINAL:  Soft, non tender. ND, BS present. EXTREMITIES: No clubbing, cyanosis, edema. No obvious deformities. NEUROLOGICAL: Normal strength. Alert and  oriented x 3, MAE x 4. SKIN: No rashes, warm and dry. No obvious skin lesions. PSYCHIATRIC: Normal mood and behavior.   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 - Did not want surgical referral due to chronic respiratory failure - Ordered MRI of the brain for complete staging. - Prescribed Ativan  1 mg for MRI-related anxiety. - Encouraged smoking cessation to prevent cancer recurrence.  Chronic obstructive pulmonary disease COPD with wheezing and respiratory symptoms, suspect a flare.  Using albuterol  and Symbicort .  Plan - Prescribed prednisone : 4 tablets for 2 days, then 3 tablets for 2 days. - Scheduled follow-up with pulmonary specialist in [place] for COPD management.  Nicotine  dependence Ongoing nicotine  dependence.  Working hard to quit Smoking cessation crucial for cancer recurrence prevention and health improvement. - Encouraged continued use of nicotine  mints. - Advised on the importance of smoking cessation x 3 minutes at today's OV.  Anxiety symptoms related to procedures Anxiety related to medical procedures, particularly MRI. Ativan  previously effective. - Prescribed Ativan  1 mg  x 1 dose for MRI-related anxiety. - Take 30 minutes before scan  - Ensured she has a family companion for MRI appointment.  I spent 30 minutes dedicated to the care of this patient on the date of this encounter to include pre-visit review of records, face-to-face time with the patient discussing conditions above, post visit ordering of testing, clinical documentation with the electronic health record, making appropriate referrals as documented, and communicating necessary information to the patient's  healthcare team.      Lauraine JULIANNA Lites, NP 10/02/2024  9:47 AM

## 2024-10-05 ENCOUNTER — Other Ambulatory Visit: Payer: Self-pay

## 2024-10-05 NOTE — Progress Notes (Signed)
 The proposed treatment discussed in conference is for discussion purpose only and is not a binding recommendation.  The patients have not been physically examined, or presented with their treatment options.  Therefore, final treatment plans cannot be decided.

## 2024-10-06 NOTE — Progress Notes (Signed)
 Location of tumor and Histology per Pathology Report:  Left upper lobe   Biopsy:    Past/Anticipated interventions by surgeon, if any:     Past/Anticipated interventions by medical oncology/pulmonology, if any:     Pain issues, if any:  yes, reports having pain to left chest, back, and legs.   SAFETY ISSUES: Prior radiation? no Pacemaker/ICD? no Possible current pregnancy? no Is the patient on methotrexate? no  Current Complaints / other details:      BP (!) 154/79 (BP Location: Left Arm, Patient Position: Sitting)   Pulse (!) 114   Temp 97.7 F (36.5 C) (Temporal)   Resp 18   Ht 5' (1.524 m)   Wt 154 lb 6 oz (70 kg)   SpO2 93%   BMI 30.15 kg/m

## 2024-10-07 NOTE — Progress Notes (Signed)
 Radiation Oncology         ([phone]) [phone] ________________________________  Initial Outpatient Consultation  Name: [name] MRN: [id number]  Date: 10/09/2024  DOB: 06-24-1966  RR:[id number], No Pcp Per  Shelah Lamar RAMAN, MD   REFERRING PHYSICIAN: Shelah Lamar RAMAN, MD  DIAGNOSIS: There were no encounter diagnoses.  Squamous cell carcinoma of the left upper lung   HISTORY OF PRESENT ILLNESS::[name] is a 58 y.o. female who is accompanied by ***. she is seen as a courtesy of Dr. Byrum for an opinion concerning radiation therapy as part of management for her recently diagnosed left lung cancer. She is a long time smoker and is on supplemental O2 at baseline in the setting of chronic respiratory failure and COPD. She is followed by Dr. Shelah and NP Ruthell at [facility].    The patient presented for a lung cancer screening CT on 04/14/24 which showed a suspicious 8.9 mm nodular opacity in the medial LUL. CT findings also included diffuse bronchial wall thickening and irregularity with scattered ground-glass and confluent nodular opacities in both lungs, favoring an infectious / inflammatory etiology.   A follow-up chest CT w/out contrast was accordingly performed on 07/27/24 which demonstrated an interval increase in size of the left apical nodules, measuring up to 1.3 cm; increased diffuse bronchial wall thickening and areas of extensive new and increasing tree-in-bud nodularity throughout both lungs, consistent with worsening infectious/inflammatory bronchiolitis; and an indeterminate interval increase in size of a right hilar lymph node (thought to likely reflective a reactive etiology).   She was also hospitalized around this time from 08/06/24 through 08/11/24 for management of a COPD exacerbation w/ hypoxia. Chest x-ray performed upon admission showed evidence of respiratory bronchiolitis. Her hospital course consisted of nebulizers, 1L bolus, and steroids and she was  discharged home on a prednisone  taper.   Given her progressive CT findings, a PET scan was obtained on 08/28/24 which demonstrated hypermetabolism associated with the suspicious 1.2 cm left apical pleural based nodule, concerning for stage 1a primary bronchogenic carcinoma. PET findings also included an indeterminate 7 mm central cephalad LUL pulmonary nodule (on the lower end of PET resolution and thus indeterminate).   Based on Dr. Lanny recommendations, she opted to proceed with a bronchoscopy w/ biopsies of the suspicious LUL nodule on 09/25/24. Cytology from FNA of the LUL nodule showed findings consistent with squamous cell carcinoma.   She was recently seen by NP Groce at [facility] on 10/02/24 to discuss treatment options. She did endorse having some increasing respiratory symptoms and wheezing at that time and she was accordingly prescribed a course of prednisone  for a suspected COPD flare up. In terms of treatment, consideration of SBRT to the LUL nodule was discussed at that time which we will discuss in detail together today.   She has declined a referral to cardiothoracic surgery in the setting of respiratory status / chronic respiratory failure. She has been scheduled for an MRI of the brain on 10/10/24 to complete her staging work-up.     PREVIOUS RADIATION THERAPY: No  PAST MEDICAL HISTORY:  Past Medical History:  Diagnosis Date   Angina    Anxiety state 10/15/2015   ARDS (adult respiratory distress syndrome) (HCC)    Jan 2011   Asthma    Chronic back pain    COPD (chronic obstructive pulmonary disease) (HCC)    Diabetes mellitus    Encounter for smoking cessation counseling 02/21/2024   Gallstones with biliary obstruction  GERD (gastroesophageal reflux disease) 12/21/2012   HTN (hypertension) 10/15/2015   Hyperglycemia, drug-induced    steroid induced hyperglycemia   Migraine headache    On home O2    Pneumonia    Recurrent upper respiratory infection (URI)     Shortness of breath     PAST SURGICAL HISTORY: Past Surgical History:  Procedure Laterality Date   BILIARY DILATION  01/04/2024   Procedure: BILIARY DILATION;  Surgeon: Jinny Carmine, MD;  Location: [facility] ENDOSCOPY;  Service: Endoscopy;;   BRONCHIAL BIOPSY  09/25/2024   Procedure: BRONCHOSCOPY, WITH BIOPSY;  Surgeon: Shelah Lamar RAMAN, MD;  Location: [facility] ENDOSCOPY;  Service: Pulmonary;;   BRONCHIAL NEEDLE ASPIRATION BIOPSY  09/25/2024   Procedure: BRONCHOSCOPY, WITH NEEDLE ASPIRATION BIOPSY;  Surgeon: Shelah Lamar RAMAN, MD;  Location: [facility] ENDOSCOPY;  Service: Pulmonary;;   c-section     ENDOSCOPIC RETROGRADE CHOLANGIOPANCREATOGRAPHY (ERCP) WITH PROPOFOL  N/A 01/04/2024   Procedure: ENDOSCOPIC RETROGRADE CHOLANGIOPANCREATOGRAPHY (ERCP) WITH PROPOFOL ;  Surgeon: Jinny Carmine, MD;  Location: [facility] ENDOSCOPY;  Service: Endoscopy;  Laterality: N/A;   REMOVAL OF STONES  01/04/2024   Procedure: REMOVAL OF STONES;  Surgeon: Jinny Carmine, MD;  Location: [facility] ENDOSCOPY;  Service: Endoscopy;;   TRACHEOSTOMY     decannulated 12/2009   TUBAL LIGATION     uterine ablasion     VIDEO BRONCHOSCOPY WITH ENDOBRONCHIAL NAVIGATION Left 09/25/2024   Procedure: VIDEO BRONCHOSCOPY WITH ENDOBRONCHIAL NAVIGATION;  Surgeon: Shelah Lamar RAMAN, MD;  Location: [facility] ENDOSCOPY;  Service: Pulmonary;  Laterality: Left;    FAMILY HISTORY:  Family History  Problem Relation Age of Onset   Lung cancer Mother    COPD Mother    Cirrhosis Father    COPD Father    Coronary artery disease Brother    COPD Brother    Pneumonia Brother    Migraines Brother    Diabetes Other    Cancer Other    Hypertension Other     SOCIAL HISTORY:  Social History   Tobacco Use   Smoking status: Every Day    Current packs/day: 1.00    Average packs/day: 1 pack/day for 46.9 years (46.9 ttl pk-yrs)    Types: Cigarettes    Start date: 1979   Smokeless tobacco: Never   Tobacco comments:    1 pack a day 09/23/2024 KRD  Vaping Use   Vaping status:  Never Used  Substance Use Topics   Alcohol use: No   Drug use: No    Comment: Formerly used cocaine , narcotics, THC    ALLERGIES:  Allergies  Allergen Reactions   Levofloxacin  Anaphylaxis    Arm also turned red where IV was place   Penicillins Other (See Comments)    convulsions   Doxycycline  Nausea And Vomiting    MEDICATIONS:  Current Outpatient Medications  Medication Sig Dispense Refill   albuterol  (PROVENTIL ) (2.5 MG/3ML) 0.083% nebulizer solution Take 3 mLs (2.5 mg total) by nebulization every 6 (six) hours as needed for wheezing or shortness of breath. 75 mL 3   albuterol  (VENTOLIN  HFA) 108 (90 Base) MCG/ACT inhaler Inhale 2 puffs into the lungs every 4 (four) hours as needed for wheezing or shortness of breath. 18 g 1   azithromycin  (ZITHROMAX ) 250 MG tablet Take 2 today and one daily for the next 4 days. 6 tablet 0   glipiZIDE  (GLUCOTROL ) 10 MG tablet Take 20 mg by mouth daily before breakfast.     insulin  aspart (NOVOLOG  FLEXPEN) 100 UNIT/ML FlexPen 3 TIMES A DAY WITH MEALS SUGAR  70-120--no units, 121-150--3 units, 151-200--4 units; 201-250--7 units, 251-300--11 units; 301-350 15 units; 351-400 20 units 15 mL 0   LANTUS  SOLOSTAR 100 UNIT/ML Solostar Pen Inject 30 Units into the skin at bedtime. 15 mL 0   ondansetron  (ZOFRAN -ODT) 4 MG disintegrating tablet 4mg  ODT q4 hours prn nausea/vomit (Patient taking differently: Take 4 mg by mouth every 4 (four) hours as needed for nausea or vomiting. 4mg  ODT q4 hours prn nausea/vomit) 10 tablet 0   pantoprazole  (PROTONIX ) 40 MG tablet Take 1 tablet (40 mg total) by mouth every other day.     predniSONE  (DELTASONE ) 10 MG tablet Prednisone  taper; 10 mg tablets: 4 tabs x 2 days, 3 tabs x 2 days, 2 tabs x 2 days 1 tab x 2 days then stop. 20 tablet 0   rOPINIRole  (REQUIP ) 2 MG tablet Take 2 mg by mouth 3 (three) times daily.     rosuvastatin  (CRESTOR ) 40 MG tablet Take 40 mg by mouth daily.     SUMAtriptan  (IMITREX ) 50 MG tablet Take 50 mg  by mouth as needed for migraine or headache.     SYMBICORT  80-4.5 MCG/ACT inhaler Inhale 2 puffs into the lungs 2 (two) times daily.     No current facility-administered medications for this encounter.    REVIEW OF SYSTEMS:  A 10+ POINT REVIEW OF SYSTEMS WAS OBTAINED including neurology, dermatology, psychiatry, cardiac, respiratory, lymph, extremities, GI, GU, musculoskeletal, constitutional, reproductive, HEENT. ***   PHYSICAL EXAM:  vitals were not taken for this visit.   General: Alert and oriented, in no acute distress HEENT: Head is normocephalic. Extraocular movements are intact. Oropharynx is clear. Neck: Neck is supple, no palpable cervical or supraclavicular lymphadenopathy. Heart: Regular in rate and rhythm with no murmurs, rubs, or gallops. Chest: Clear to auscultation bilaterally, with no rhonchi, wheezes, or rales. Abdomen: Soft, nontender, nondistended, with no rigidity or guarding. Extremities: No cyanosis or edema. Lymphatics: see Neck Exam Skin: No concerning lesions. Musculoskeletal: symmetric strength and muscle tone throughout. Neurologic: Cranial nerves II through XII are grossly intact. No obvious focalities. Speech is fluent. Coordination is intact. Psychiatric: Judgment and insight are intact. Affect is appropriate. ***  ECOG = ***  0 - Asymptomatic (Fully active, able to carry on all predisease activities without restriction)  1 - Symptomatic but completely ambulatory (Restricted in physically strenuous activity but ambulatory and able to carry out work of a light or sedentary nature. For example, light housework, office work)  2 - Symptomatic, <50% in bed during the day (Ambulatory and capable of all self care but unable to carry out any work activities. Up and about more than 50% of waking hours)  3 - Symptomatic, >50% in bed, but not bedbound (Capable of only limited self-care, confined to bed or chair 50% or more of waking hours)  4 - Bedbound  (Completely disabled. Cannot carry on any self-care. Totally confined to bed or chair)  5 - Death   Raylene MM, Creech RH, Tormey DC, et al. [phone]). Toxicity and response criteria of the [facility]. Am. DOROTHA Bridges. Oncol. 5 (6): 649-55  LABORATORY DATA:  Lab Results  Component Value Date   WBC 11.4 (H) 09/25/2024   HGB 13.1 09/25/2024   HCT 42.9 09/25/2024   MCV 89.4 09/25/2024   PLT 245 09/25/2024   NEUTROABS 11.4 (H) 08/06/2024   Lab Results  Component Value Date   NA 139 09/25/2024   K 4.0 09/25/2024   CL 91 (L) 09/25/2024   CO2  38 (H) 09/25/2024   GLUCOSE 201 (H) 09/25/2024   BUN 8 09/25/2024   CREATININE 0.46 09/25/2024   CALCIUM  8.9 09/25/2024      RADIOGRAPHY: DG Chest Port 1 View Result Date: 09/25/2024 EXAM: 1 VIEW(S) XRAY OF THE CHEST 09/25/2024 03:46:00 PM COMPARISON: 08/06/2024 CLINICAL HISTORY: S/P bronchoscopy with biopsy FINDINGS: LUNGS AND PLEURA: Stable left upper lobe nodule. Chronic, diffuse interstitial prominence. Bibasilar scarring versus atelectasis. No pulmonary edema. No pleural effusion. No pneumothorax. HEART AND MEDIASTINUM: Fiducial marker projecting over superior margin of the aortic arch. No acute abnormality of the cardiac and mediastinal silhouettes. BONES AND SOFT TISSUES: No acute osseous abnormality. IMPRESSION: 1. No pneumothorax identified status post bronchoscopy and biopsy. 2. Stable left upper lobe pulmonary nodule, with new fiducial marker in place. Electronically signed by: Waddell Calk MD 09/25/2024 04:28 PM EST RP Workstation: [id number]   DG C-ARM BRONCHOSCOPY Result Date: 09/25/2024 C-ARM BRONCHOSCOPY: Fluoroscopy was utilized by the requesting physician.  No radiographic interpretation.      IMPRESSION: Squamous cell carcinoma of the left upper lung   ***  Today, I talked to the patient and family about the findings and work-up thus far.  We discussed the natural history of *** and general treatment,  highlighting the role of radiotherapy in the management.  We discussed the available radiation techniques, and focused on the details of logistics and delivery.  We reviewed the anticipated acute and late sequelae associated with radiation in this setting.  The patient was encouraged to ask questions that I answered to the best of my ability. *** A patient consent form was discussed and signed.  We retained a copy for our records.  The patient would like to proceed with radiation and will be scheduled for CT simulation.  PLAN: ***    *** minutes of total time was spent for this patient encounter, including preparation, face-to-face counseling with the patient and coordination of care, physical exam, and documentation of the encounter.   ------------------------------------------------  Lynwood CHARM Nasuti, PhD, MD  This document serves as a record of services personally performed by Lynwood Nasuti, MD. It was created on his behalf by Dorthy Fuse, a trained medical scribe. The creation of this record is based on the scribe's personal observations and the provider's statements to them. This document has been checked and approved by the attending provider.

## 2024-10-09 ENCOUNTER — Ambulatory Visit
Admission: RE | Admit: 2024-10-09 | Discharge: 2024-10-09 | Disposition: A | Discharge status: Home / Self Care | Source: Ambulatory Visit | Attending: Radiation Oncology | Admitting: Radiation Oncology

## 2024-10-09 ENCOUNTER — Encounter: Payer: Self-pay | Admitting: Radiation Oncology

## 2024-10-09 ENCOUNTER — Encounter: Payer: Self-pay | Admitting: Licensed Clinical Social Worker

## 2024-10-09 VITALS — BP 154/79 | HR 114 | Temp 97.7°F | Resp 18 | Ht 60.0 in | Wt 154.4 lb

## 2024-10-09 DIAGNOSIS — Z8701 Personal history of pneumonia (recurrent): Secondary | ICD-10-CM | POA: Insufficient documentation

## 2024-10-09 DIAGNOSIS — R911 Solitary pulmonary nodule: Secondary | ICD-10-CM | POA: Diagnosis not present

## 2024-10-09 DIAGNOSIS — Z79899 Other long term (current) drug therapy: Secondary | ICD-10-CM | POA: Insufficient documentation

## 2024-10-09 DIAGNOSIS — Z801 Family history of malignant neoplasm of trachea, bronchus and lung: Secondary | ICD-10-CM | POA: Insufficient documentation

## 2024-10-09 DIAGNOSIS — I10 Essential (primary) hypertension: Secondary | ICD-10-CM | POA: Insufficient documentation

## 2024-10-09 DIAGNOSIS — F17210 Nicotine dependence, cigarettes, uncomplicated: Secondary | ICD-10-CM | POA: Diagnosis not present

## 2024-10-09 DIAGNOSIS — C3412 Malignant neoplasm of upper lobe, left bronchus or lung: Secondary | ICD-10-CM

## 2024-10-09 DIAGNOSIS — E119 Type 2 diabetes mellitus without complications: Secondary | ICD-10-CM | POA: Insufficient documentation

## 2024-10-09 DIAGNOSIS — J219 Acute bronchiolitis, unspecified: Secondary | ICD-10-CM | POA: Insufficient documentation

## 2024-10-09 DIAGNOSIS — G8929 Other chronic pain: Secondary | ICD-10-CM | POA: Insufficient documentation

## 2024-10-09 DIAGNOSIS — Z7984 Long term (current) use of oral hypoglycemic drugs: Secondary | ICD-10-CM | POA: Insufficient documentation

## 2024-10-09 DIAGNOSIS — K219 Gastro-esophageal reflux disease without esophagitis: Secondary | ICD-10-CM | POA: Insufficient documentation

## 2024-10-09 DIAGNOSIS — J441 Chronic obstructive pulmonary disease with (acute) exacerbation: Secondary | ICD-10-CM | POA: Diagnosis not present

## 2024-10-09 DIAGNOSIS — J9610 Chronic respiratory failure, unspecified whether with hypoxia or hypercapnia: Secondary | ICD-10-CM | POA: Diagnosis not present

## 2024-10-09 DIAGNOSIS — Z794 Long term (current) use of insulin: Secondary | ICD-10-CM | POA: Diagnosis not present

## 2024-10-10 ENCOUNTER — Ambulatory Visit (HOSPITAL_COMMUNITY)
Admission: RE | Admit: 2024-10-10 | Discharge: 2024-10-10 | Disposition: A | Discharge status: Home / Self Care | Source: Ambulatory Visit | Attending: Acute Care | Admitting: Acute Care

## 2024-10-10 DIAGNOSIS — C3490 Malignant neoplasm of unspecified part of unspecified bronchus or lung: Secondary | ICD-10-CM | POA: Diagnosis present

## 2024-10-10 MED ORDER — GADOBUTROL 1 MMOL/ML IV SOLN
7.0000 mL | Freq: Once | INTRAVENOUS | Status: AC | PRN
  Administered 2024-10-10: 7 mL via INTRAVENOUS

## 2024-10-13 ENCOUNTER — Other Ambulatory Visit: Payer: Self-pay

## 2024-10-13 DIAGNOSIS — C3492 Malignant neoplasm of unspecified part of left bronchus or lung: Secondary | ICD-10-CM

## 2024-10-16 ENCOUNTER — Inpatient Hospital Stay

## 2024-10-16 ENCOUNTER — Inpatient Hospital Stay: Attending: Internal Medicine | Admitting: Internal Medicine

## 2024-10-16 VITALS — Temp 97.7°F | Resp 17 | Ht 60.0 in | Wt 156.0 lb

## 2024-10-16 DIAGNOSIS — J440 Chronic obstructive pulmonary disease with acute lower respiratory infection: Secondary | ICD-10-CM | POA: Insufficient documentation

## 2024-10-16 DIAGNOSIS — C3412 Malignant neoplasm of upper lobe, left bronchus or lung: Secondary | ICD-10-CM | POA: Diagnosis present

## 2024-10-16 DIAGNOSIS — E119 Type 2 diabetes mellitus without complications: Secondary | ICD-10-CM | POA: Diagnosis not present

## 2024-10-16 DIAGNOSIS — F17210 Nicotine dependence, cigarettes, uncomplicated: Secondary | ICD-10-CM | POA: Insufficient documentation

## 2024-10-16 DIAGNOSIS — C3490 Malignant neoplasm of unspecified part of unspecified bronchus or lung: Secondary | ICD-10-CM

## 2024-10-16 DIAGNOSIS — I10 Essential (primary) hypertension: Secondary | ICD-10-CM | POA: Diagnosis not present

## 2024-10-16 DIAGNOSIS — K219 Gastro-esophageal reflux disease without esophagitis: Secondary | ICD-10-CM | POA: Diagnosis not present

## 2024-10-16 DIAGNOSIS — C3492 Malignant neoplasm of unspecified part of left bronchus or lung: Secondary | ICD-10-CM

## 2024-10-16 LAB — CBC WITH DIFFERENTIAL (CANCER CENTER ONLY)
Abs Immature Granulocytes: 0.04 K/uL (ref 0.00–0.07)
Basophils Absolute: 0 K/uL (ref 0.0–0.1)
Basophils Relative: 0 %
Eosinophils Absolute: 0.1 K/uL (ref 0.0–0.5)
Eosinophils Relative: 1 %
HCT: 41.2 % (ref 36.0–46.0)
Hemoglobin: 12.4 g/dL (ref 12.0–15.0)
Immature Granulocytes: 0 %
Lymphocytes Relative: 23 %
Lymphs Abs: 2.2 K/uL (ref 0.7–4.0)
MCH: 27.1 pg (ref 26.0–34.0)
MCHC: 30.1 g/dL (ref 30.0–36.0)
MCV: 90.2 fL (ref 80.0–100.0)
Monocytes Absolute: 0.6 K/uL (ref 0.1–1.0)
Monocytes Relative: 6 %
Neutro Abs: 6.7 K/uL (ref 1.7–7.7)
Neutrophils Relative %: 70 %
Platelet Count: 239 K/uL (ref 150–400)
RBC: 4.57 MIL/uL (ref 3.87–5.11)
RDW: 12.7 % (ref 11.5–15.5)
WBC Count: 9.7 K/uL (ref 4.0–10.5)
nRBC: 0 % (ref 0.0–0.2)

## 2024-10-16 LAB — CMP (CANCER CENTER ONLY)
ALT: 9 U/L (ref 0–44)
AST: 14 U/L — ABNORMAL LOW (ref 15–41)
Albumin: 4.3 g/dL (ref 3.5–5.0)
Alkaline Phosphatase: 108 U/L (ref 38–126)
Anion gap: 5 (ref 5–15)
BUN: 12 mg/dL (ref 6–20)
CO2: 41 mmol/L — ABNORMAL HIGH (ref 22–32)
Calcium: 9.1 mg/dL (ref 8.9–10.3)
Chloride: 94 mmol/L — ABNORMAL LOW (ref 98–111)
Creatinine: 0.49 mg/dL (ref 0.44–1.00)
GFR, Estimated: >60 mL/min (ref >60)
Glucose, Bld: 280 mg/dL — ABNORMAL HIGH (ref 70–99)
Potassium: 4.1 mmol/L (ref 3.5–5.1)
Sodium: 140 mmol/L (ref 135–145)
Total Bilirubin: 0.2 mg/dL (ref 0.0–1.2)
Total Protein: 7.5 g/dL (ref 6.5–8.1)

## 2024-10-16 NOTE — Progress Notes (Signed)
 [facility] [phone]   [phone]  CONSULT NOTE  REFERRING PHYSICIAN: Dr. Lamar Chris  REASON FOR CONSULTATION:  58 years old white female recently diagnosed with lung cancer.  HPI [name] is a 58 y.o. female.   HPI  Discussed the use of AI scribe software for clinical note transcription with the patient, who gave verbal consent to proceed.  History of Present Illness [name] is a 58 year old female with lung cancer who presents for evaluation and management of her condition. She is accompanied by her daughter.  She was recently diagnosed with lung cancer after imaging studies revealed a nodule in the medial part of the left upper lobe of her lung. Initially, she had pneumonia, and during an x-ray or CT scan, the nodule was discovered. It was first noted on Apr 14, 2024, measuring 0.9 cm, and a follow-up scan on July 27, 2024, showed it had grown to 1.3 cm. A PET scan on August 28, 2024, showed the nodule was hypermetabolic, and a bronchoscopy on September 25, 2024, diagnosed squamous cell carcinoma.  She has been on oxygen  therapy since 2012 due to COPD, using 3 liters of oxygen , which she reduces to 2.5 liters when not walking. She experiences shortness of breath, chest pain described as 'like somebody's sitting on me,' and a cough. She also reports nausea, vomiting, and diarrhea, with recent constipation followed by frequent bathroom visits. She takes Zofran  for nausea and has been on steroids, which she attributes to weight gain.  Her past medical history includes COPD, high blood pressure, diabetes, acid reflux, anxiety, and angina. She has a family history of lung cancer in her mother and brother, as well as other cancers in her grandmother and grandfather. She has been smoking since 2009 and is currently struggling to quit, having tried patches without success. She does not consume alcohol or use street drugs.  No recent weight  loss, stroke, or heart attacks. She is allergic to Levaquin , penicillin, and tetracycline.     Past Medical History:  Diagnosis Date   Angina    Anxiety state 10/15/2015   ARDS (adult respiratory distress syndrome) (HCC)    Jan 2011   Asthma    Chronic back pain    COPD (chronic obstructive pulmonary disease) (HCC)    Diabetes mellitus    Encounter for smoking cessation counseling 02/21/2024   Gallstones with biliary obstruction    GERD (gastroesophageal reflux disease) 12/21/2012   HTN (hypertension) 10/15/2015   Hyperglycemia, drug-induced    steroid induced hyperglycemia   Migraine headache    On home O2    Pneumonia    Recurrent upper respiratory infection (URI)    Shortness of breath       Past Surgical History:  Procedure Laterality Date   BILIARY DILATION  01/04/2024   Procedure: BILIARY DILATION;  Surgeon: Jinny Carmine, MD;  Location: [facility] ENDOSCOPY;  Service: Endoscopy;;   BRONCHIAL BIOPSY  09/25/2024   Procedure: BRONCHOSCOPY, WITH BIOPSY;  Surgeon: Chris Lamar RAMAN, MD;  Location: [facility] ENDOSCOPY;  Service: Pulmonary;;   BRONCHIAL NEEDLE ASPIRATION BIOPSY  09/25/2024   Procedure: BRONCHOSCOPY, WITH NEEDLE ASPIRATION BIOPSY;  Surgeon: Chris Lamar RAMAN, MD;  Location: [facility] ENDOSCOPY;  Service: Pulmonary;;   c-section     ENDOSCOPIC RETROGRADE CHOLANGIOPANCREATOGRAPHY (ERCP) WITH PROPOFOL  N/A 01/04/2024   Procedure: ENDOSCOPIC RETROGRADE CHOLANGIOPANCREATOGRAPHY (ERCP) WITH PROPOFOL ;  Surgeon: Jinny Carmine, MD;  Location: [facility] ENDOSCOPY;  Service: Endoscopy;  Laterality: N/A;  REMOVAL OF STONES  01/04/2024   Procedure: REMOVAL OF STONES;  Surgeon: Jinny Carmine, MD;  Location: [facility] ENDOSCOPY;  Service: Endoscopy;;   TRACHEOSTOMY     decannulated 12/2009   TUBAL LIGATION     uterine ablasion     VIDEO BRONCHOSCOPY WITH ENDOBRONCHIAL NAVIGATION Left 09/25/2024   Procedure: VIDEO BRONCHOSCOPY WITH ENDOBRONCHIAL NAVIGATION;  Surgeon: Shelah Lamar RAMAN, MD;  Location: [facility] ENDOSCOPY;   Service: Pulmonary;  Laterality: Left;    Family History  Problem Relation Age of Onset   Cancer Mother        lung and cervical   Lung cancer Mother    COPD Mother    Cirrhosis Father    COPD Father    Cancer Brother        lung   Coronary artery disease Brother    COPD Brother    Pneumonia Brother    Migraines Brother    Cancer Maternal Grandmother        cervical   Diabetes Other    Cancer Other    Hypertension Other     Social History Social History   Tobacco Use   Smoking status: Every Day    Current packs/day: 1.00    Average packs/day: 1 pack/day for 46.9 years (46.9 ttl pk-yrs)    Types: Cigarettes    Start date: 1979   Smokeless tobacco: Never   Tobacco comments:    1 pack a day 09/23/2024 KRD  Vaping Use   Vaping status: Never Used  Substance Use Topics   Alcohol use: No   Drug use: No    Comment: Formerly used cocaine , narcotics, THC    Allergies  Allergen Reactions   Levofloxacin  Anaphylaxis    Arm also turned red where IV was place   Penicillins Other (See Comments)    convulsions   Doxycycline  Nausea And Vomiting    Current Outpatient Medications  Medication Sig Dispense Refill   albuterol  (PROVENTIL ) (2.5 MG/3ML) 0.083% nebulizer solution Take 3 mLs (2.5 mg total) by nebulization every 6 (six) hours as needed for wheezing or shortness of breath. 75 mL 3   albuterol  (VENTOLIN  HFA) 108 (90 Base) MCG/ACT inhaler Inhale 2 puffs into the lungs every 4 (four) hours as needed for wheezing or shortness of breath. 18 g 1   azithromycin  (ZITHROMAX ) 250 MG tablet Take 2 today and one daily for the next 4 days. 6 tablet 0   glipiZIDE  (GLUCOTROL ) 10 MG tablet Take 20 mg by mouth daily before breakfast.     insulin  aspart (NOVOLOG  FLEXPEN) 100 UNIT/ML FlexPen 3 TIMES A DAY WITH MEALS SUGAR  70-120--no units, 121-150--3 units, 151-200--4 units; 201-250--7 units, 251-300--11 units; 301-350 15 units; 351-400 20 units 15 mL 0   LANTUS  SOLOSTAR 100 UNIT/ML  Solostar Pen Inject 30 Units into the skin at bedtime. 15 mL 0   ondansetron  (ZOFRAN -ODT) 4 MG disintegrating tablet 4mg  ODT q4 hours prn nausea/vomit (Patient taking differently: Take 4 mg by mouth every 4 (four) hours as needed for nausea or vomiting. 4mg  ODT q4 hours prn nausea/vomit) 10 tablet 0   pantoprazole  (PROTONIX ) 40 MG tablet Take 1 tablet (40 mg total) by mouth every other day.     predniSONE  (DELTASONE ) 10 MG tablet Prednisone  taper; 10 mg tablets: 4 tabs x 2 days, 3 tabs x 2 days, 2 tabs x 2 days 1 tab x 2 days then stop. 20 tablet 0   rOPINIRole  (REQUIP ) 2 MG tablet Take 2 mg by mouth  3 (three) times daily.     rosuvastatin  (CRESTOR ) 40 MG tablet Take 40 mg by mouth daily.     SUMAtriptan  (IMITREX ) 50 MG tablet Take 50 mg by mouth as needed for migraine or headache.     SYMBICORT  80-4.5 MCG/ACT inhaler Inhale 2 puffs into the lungs 2 (two) times daily.     No current facility-administered medications for this visit.    Review of Systems  Constitutional: positive for fatigue Eyes: negative Ears, nose, mouth, throat, and face: negative Respiratory: positive for cough and dyspnea on exertion Cardiovascular: negative Gastrointestinal: negative Genitourinary:negative Integument/breast: negative Hematologic/lymphatic: negative Musculoskeletal:negative Neurological: negative Behavioral/Psych: negative Endocrine: negative Allergic/Immunologic: negative  Physical Exam  MJO:jozmu, healthy, no distress, well nourished, and well developed SKIN: skin color, texture, turgor are normal, no rashes or significant lesions HEAD: Normocephalic, No masses, lesions, tenderness or abnormalities EYES: normal, PERRLA, Conjunctiva are pink and non-injected EARS: External ears normal, Canals clear OROPHARYNX:no exudate, no erythema, and lips, buccal mucosa, and tongue normal  NECK: supple, no adenopathy, no JVD LYMPH:  no palpable lymphadenopathy, no hepatosplenomegaly BREAST:not  examined LUNGS: clear to auscultation , and palpation HEART: regular rate & rhythm, no murmurs, and no gallops ABDOMEN:abdomen soft, non-tender, normal bowel sounds, and no masses or organomegaly BACK: Back symmetric, no curvature., No CVA tenderness EXTREMITIES:no joint deformities, effusion, or inflammation, no edema  NEURO: alert & oriented x 3 with fluent speech, no focal motor/sensory deficits  PERFORMANCE STATUS: ECOG 1  LABORATORY DATA: Lab Results  Component Value Date   WBC 9.7 10/16/2024   HGB 12.4 10/16/2024   HCT 41.2 10/16/2024   MCV 90.2 10/16/2024   PLT 239 10/16/2024      Chemistry      Component Value Date/Time   NA 139 09/25/2024 1328   K 4.0 09/25/2024 1328   CL 91 (L) 09/25/2024 1328   CO2 38 (H) 09/25/2024 1328   BUN 8 09/25/2024 1328   CREATININE 0.46 09/25/2024 1328      Component Value Date/Time   CALCIUM  8.9 09/25/2024 1328   ALKPHOS 93 08/06/2024 1350   AST 15 08/06/2024 1350   ALT 12 08/06/2024 1350   BILITOT 0.5 08/06/2024 1350       RADIOGRAPHIC STUDIES: MR BRAIN W WO CONTRAST Result Date: 10/14/2024 EXAM: MRI BRAIN WITH AND WITHOUT CONTRAST 10/10/2024 04:42:21 PM TECHNIQUE: Multiplanar multisequence MRI of the head/brain was performed with and without the administration of intravenous contrast. CONTRAST: 7 mL of Gadavist . COMPARISON: CT head without contrast 03/30/2022. CLINICAL HISTORY: Non-small cell lung cancer (NSCLC), staging, malignant neoplasm of unspecified part of unspecified bronchus or lung (HCC). FINDINGS: BRAIN AND VENTRICLES: No acute infarct. No acute intracranial hemorrhage. No mass effect or midline shift. No hydrocephalus. The sella is unremarkable. Normal flow voids. No mass or abnormal enhancement. Periventricular and subcortical T2 hyperintensities are mildly advanced for age. ORBITS: No acute abnormality. SINUSES: No acute abnormality. BONES AND SOFT TISSUES: Normal bone marrow signal and enhancement. No acute soft tissue  abnormality. IMPRESSION: 1. No acute intracranial abnormality. 2. Mildly advanced periventricular and subcortical T2 hyperintensities for age, unchanged from prior CT. The finding is nonspecific but can be seen in the setting of chronic microvascular ischemia, a demyelinating process such as multiple sclerosis, vasculitis, complicated migraine headaches, or as the sequelae of a prior infectious or inflammatory process. Electronically signed by: Lonni Necessary MD 10/14/2024 12:52 PM EST RP Workstation: [id number]   DG Chest Port 1 View Result Date: 09/25/2024 EXAM: 1 VIEW(S) XRAY OF THE  CHEST 09/25/2024 03:46:00 PM COMPARISON: 08/06/2024 CLINICAL HISTORY: S/P bronchoscopy with biopsy FINDINGS: LUNGS AND PLEURA: Stable left upper lobe nodule. Chronic, diffuse interstitial prominence. Bibasilar scarring versus atelectasis. No pulmonary edema. No pleural effusion. No pneumothorax. HEART AND MEDIASTINUM: Fiducial marker projecting over superior margin of the aortic arch. No acute abnormality of the cardiac and mediastinal silhouettes. BONES AND SOFT TISSUES: No acute osseous abnormality. IMPRESSION: 1. No pneumothorax identified status post bronchoscopy and biopsy. 2. Stable left upper lobe pulmonary nodule, with new fiducial marker in place. Electronically signed by: Waddell Calk MD 09/25/2024 04:28 PM EST RP Workstation: [id number]   DG C-ARM BRONCHOSCOPY Result Date: 09/25/2024 C-ARM BRONCHOSCOPY: Fluoroscopy was utilized by the requesting physician.  No radiographic interpretation.    ASSESSMENT: This is a very pleasant 58 years old white female diagnosed with a stage Ia (T1b, N0, M0) non-small cell lung cancer, squamous cell carcinoma presented with left upper lobe lung nodule diagnosed in November 2025.  There was another tiny nodule close to it.   PLAN: I had a lengthy discussion with the patient and her daughter today about her current condition and treatment options.  I personally and  independently reviewed the imaging studies as well as the pathology report and discussed the result with the patient and her daughter. 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 No evidence of brain metastasis on recent MRI. Surgery is not feasible due to her current oxygen  dependency and potential for worsening respiratory status. Stereotactic body radiation therapy (SBRT) is recommended as the primary treatment modality with the goal of cure. Close monitoring is necessary due to the risk of secondary cancers. - Referred to Dr. Phillip for stereotactic body radiation therapy (SBRT) to the left upper lobe nodule. - Scheduled follow-up scan in four months to assess the effect of radiation therapy. - Plan for close monitoring every six months for the first two years, then annually for up to five years.  Chronic obstructive pulmonary disease with long-term oxygen  therapy Chronic obstructive pulmonary disease (COPD) with long-term oxygen  therapy since 2012. Currently on 3 liters of oxygen , reduced to 2.5 liters when not walking. Wheezing noted on examination. Steroids are being used for management. - Continue current oxygen  therapy regimen. - Continue steroid therapy for COPD management.  Tobacco dependence, current smoker Current smoker with a history of smoking since 2009. Difficulty quitting due to dry skin issues with nicotine  patches. Smoking cessation is crucial to prevent further lung damage and cancer progression. - Encouraged smoking cessation and discussed alternative methods such as medication (laundry) to aid in quitting.  Hypertension Well-controlled with a blood pressure reading of 133/78 mmHg. No recent antihypertensive medication use reported.  Type 2 diabetes mellitus Type 2 diabetes mellitus.  Gastroesophageal  reflux disease Gastroesophageal reflux disease.  Anxiety disorder Anxiety disorder.  Angina pectoris Intermittent chest pain reported, described as a stinging sensation.  Cerebral small vessel disease Noted on MRI, possibly age-related or due to blood vessel disease. No evidence of cancer metastasis to the brain. - Referred to genetic counseling to assess family cancer risk and provide guidance for her children. She was advised to call immediately if she has any other concerning symptoms in the interval.  The patient voices understanding of current disease status and treatment options and is in agreement with the current care plan.  All questions  were answered. The patient knows to call the clinic with any problems, questions or concerns. We can certainly see the patient much sooner if necessary.  Thank you so much for allowing me to participate in the care of [name]. I will continue to follow up the patient with you and assist in her care. The total time spent in the appointment was 60 minutes including review of chart and various tests results, discussions about plan of care and coordination of care plan .   Disclaimer: This note was dictated with voice recognition software. Similar sounding words can inadvertently be transcribed and may not be corrected upon review.   Sherrod MARLA Sherrod October 16, 2024, 11:48 AM

## 2024-10-16 NOTE — Progress Notes (Signed)
 This NN met with the pt today at her consult appt with Dr. Sherrod. Pt was accompanied by her dtr, [facility].  The plan for the pt is SBRT to LUL nodule. Pt scheduled for CT SIM on 12/1.  Per Dr Sherrod, pt will follow up in 4mos with a CT scan 1 week prior. Pt verbalizes understanding of care plan. NN provided pt with direct contact information and encouraged pt call with any questions or concerns.  NN escorted pt to scheduling department to make follow up appts. Pt denies questions or concerns upon conclusion of appt.

## 2024-10-18 ENCOUNTER — Ambulatory Visit (HOSPITAL_BASED_OUTPATIENT_CLINIC_OR_DEPARTMENT_OTHER): Admitting: Pain Medicine

## 2024-10-18 DIAGNOSIS — G8929 Other chronic pain: Secondary | ICD-10-CM

## 2024-10-18 DIAGNOSIS — Z91199 Patient's noncompliance with other medical treatment and regimen due to unspecified reason: Secondary | ICD-10-CM

## 2024-10-18 NOTE — Progress Notes (Signed)
 Department: [facility]) Date: 10/18/2024  Event: NO SHOW.  Encounter Type: (NP) New patient evaluation. For the second time. Advance notice: None.  Reason: Unknown. Unable to contact the patient in any of the 3 numbers available in the electronic medical record. Significance: Unintended waste of community medical resources. See patient information (AVS) for Appointment policy. Disposition: No further appointments will be scheduled due to noncompliance with scheduling.

## 2024-10-18 NOTE — Patient Instructions (Signed)

## 2024-10-23 ENCOUNTER — Ambulatory Visit
Admission: RE | Admit: 2024-10-23 | Discharge: 2024-10-23 | Disposition: A | Source: Ambulatory Visit | Attending: Radiation Oncology | Admitting: Radiation Oncology

## 2024-10-23 DIAGNOSIS — Z510 Encounter for antineoplastic radiation therapy: Secondary | ICD-10-CM | POA: Diagnosis present

## 2024-10-23 DIAGNOSIS — C3412 Malignant neoplasm of upper lobe, left bronchus or lung: Secondary | ICD-10-CM | POA: Diagnosis present

## 2024-10-24 DIAGNOSIS — Z510 Encounter for antineoplastic radiation therapy: Secondary | ICD-10-CM | POA: Diagnosis not present

## 2024-10-26 ENCOUNTER — Encounter: Payer: Self-pay | Admitting: Genetic Counselor

## 2024-10-26 ENCOUNTER — Inpatient Hospital Stay: Admitting: Genetic Counselor

## 2024-10-26 ENCOUNTER — Inpatient Hospital Stay

## 2024-10-26 ENCOUNTER — Other Ambulatory Visit: Payer: Self-pay | Admitting: Genetic Counselor

## 2024-10-26 DIAGNOSIS — Z8044 Family history of malignant neoplasm of fallopian tube(s): Secondary | ICD-10-CM

## 2024-10-26 DIAGNOSIS — Z801 Family history of malignant neoplasm of trachea, bronchus and lung: Secondary | ICD-10-CM | POA: Insufficient documentation

## 2024-10-26 DIAGNOSIS — Z8051 Family history of malignant neoplasm of kidney: Secondary | ICD-10-CM | POA: Insufficient documentation

## 2024-10-26 DIAGNOSIS — C3412 Malignant neoplasm of upper lobe, left bronchus or lung: Secondary | ICD-10-CM

## 2024-10-26 LAB — GENETIC SCREENING ORDER

## 2024-10-26 NOTE — Progress Notes (Signed)
 REFERRING PROVIDER: Sherrod Sherrod, MD [address] [place],  [place] [address]  PRIMARY PROVIDER:  Patient, No Pcp Per  PRIMARY REASON FOR VISIT:  1. Family history of lung cancer   2. Family history of kidney cancer   3. Family history of fallopian tube cancer   4. 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 Arnall, a 58 y.o. female, was seen for a [place] cancer genetics consultation at the request of Dr. Sherrod due to a personal and family history of lung cancer and family history of kidney and fallopian tube cancer.  Ms. Meacham presents to clinic today to discuss the possibility of a hereditary predisposition to cancer, genetic testing, and to further clarify her future cancer risks, as well as potential cancer risks for family members.   In November 2025, at the age of 78, Ms. Honeycutt was diagnosed with lung cancer. 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  First live birth at age 41.  OCP use for approximately 1 years.  Ovaries intact: yes.  Hysterectomy: no.  Menopausal status: perimenopausal.  HRT use: 0 years. Colonoscopy: no; not examined. Mammogram within the last year: no. Number of breast biopsies: 0. Up to date with pelvic exams: no. Any excessive radiation exposure in the past: no  Past Medical History:  Diagnosis Date   Angina    Anxiety state 10/15/2015   ARDS (adult respiratory distress  syndrome) (HCC)    Jan 2011   Asthma    Chronic back pain    COPD (chronic obstructive pulmonary disease) (HCC)    Diabetes mellitus    Encounter for smoking cessation counseling 02/21/2024   Family history of fallopian tube cancer    Family history of kidney cancer    Family history of lung cancer    Gallstones with biliary obstruction    GERD (gastroesophageal reflux disease) 12/21/2012   HTN (hypertension) 10/15/2015   Hyperglycemia, drug-induced    steroid induced hyperglycemia   Migraine headache    On home O2    Pneumonia    Recurrent upper respiratory infection (URI)    Shortness of breath     Past Surgical History:  Procedure Laterality Date   BILIARY DILATION  01/04/2024   Procedure: BILIARY DILATION;  Surgeon: Jinny Carmine, MD;  Location: [facility] ENDOSCOPY;  Service: Endoscopy;;   BRONCHIAL BIOPSY  09/25/2024   Procedure: BRONCHOSCOPY, WITH BIOPSY;  Surgeon: Shelah Lamar RAMAN, MD;  Location: [facility] ENDOSCOPY;  Service: Pulmonary;;   BRONCHIAL NEEDLE ASPIRATION BIOPSY  09/25/2024   Procedure: BRONCHOSCOPY, WITH NEEDLE ASPIRATION BIOPSY;  Surgeon: Shelah Lamar RAMAN, MD;  Location: [facility] ENDOSCOPY;  Service: Pulmonary;;   c-section     ENDOSCOPIC RETROGRADE CHOLANGIOPANCREATOGRAPHY (ERCP) WITH PROPOFOL  N/A 01/04/2024   Procedure: ENDOSCOPIC RETROGRADE CHOLANGIOPANCREATOGRAPHY (ERCP) WITH PROPOFOL ;  Surgeon: Jinny Carmine, MD;  Location: [facility] ENDOSCOPY;  Service: Endoscopy;  Laterality: N/A;   REMOVAL OF STONES  01/04/2024   Procedure: REMOVAL OF STONES;  Surgeon: Jinny Carmine, MD;  Location: [facility] ENDOSCOPY;  Service: Endoscopy;;   TRACHEOSTOMY     decannulated 12/2009   TUBAL LIGATION     uterine ablasion     VIDEO BRONCHOSCOPY WITH ENDOBRONCHIAL NAVIGATION Left 09/25/2024   Procedure: VIDEO BRONCHOSCOPY WITH ENDOBRONCHIAL NAVIGATION;  Surgeon: Shelah Lamar RAMAN, MD;  Location: [facility] ENDOSCOPY;  Service: Pulmonary;  Laterality: Left;    Social History   Socioeconomic History   Marital  status: Single    Spouse name: Not on file   Number of children: Not on file   Years of education: Not on file   Highest education level: Not on file  Occupational History   Not on file  Tobacco Use   Smoking status: Every Day    Current packs/day: 1.00    Average packs/day: 1 pack/day for 46.9 years (46.9 ttl pk-yrs)    Types: Cigarettes    Start date: 1979   Smokeless tobacco: Never   Tobacco comments:    1 pack a day 09/23/2024 KRD  Vaping Use   Vaping status: Never Used  Substance and Sexual Activity   Alcohol use: No   Drug use: No    Comment: Formerly used cocaine , narcotics, THC   Sexual activity: Yes    Birth control/protection: Other-see comments  Other Topics Concern   Not on file  Social History Narrative   Living with her mom.    Social Drivers of Corporate Investment Banker Strain: Not on file  Food Insecurity: Food Insecurity Present (10/09/2024)   Hunger Vital Sign    Worried About Running Out of Food in the Last Year: Sometimes true    Ran Out of Food in the Last Year: Sometimes true  Transportation Needs: No Transportation Needs (10/09/2024)   PRAPARE - Administrator, Civil Service (Medical): No    Lack of Transportation (Non-Medical): No  Physical Activity: Not on file  Stress: Not on file  Social Connections: Socially Isolated (08/06/2024)   Social Connection and Isolation Panel    Frequency of Communication with Friends and Family: More than three times a week    Frequency of Social Gatherings with Friends and Family: More than three times a week    Attends Religious Services: Never    Database Administrator or Organizations: No    Attends Banker Meetings: Never    Marital Status: Separated     FAMILY HISTORY:  We obtained a detailed, 4-generation family history.  Significant diagnoses are listed below: Family History  Problem Relation Age of Onset   Cervical cancer Mother    Lung cancer Mother        d. 85   COPD  Mother    Cirrhosis Father        d. mid 30s   COPD Father    Lung cancer Half-Brother        lung   Coronary artery disease Half-Brother    COPD Half-Brother    Pneumonia Half-Brother    Migraines Half-Brother    Lung cancer Maternal Aunt    Brain cancer Maternal Uncle    Cervical cancer Maternal Grandmother        cervical   Lung cancer Maternal Grandmother    Bone cancer Maternal Grandfather    Polycystic kidney disease Paternal Grandmother    Brain cancer Paternal Grandfather  Diabetes Other    Cancer Other    Hypertension Other    Kidney cancer Son 79   Polycystic kidney disease Son      Significant reported conditions in the family include: Polycystic kidney disease in her son and paternal grandmother Kidney cancer in her son Lung cancer in her maternal half brother, mother, maternal aunt and maternal grandmother Fallopian tube cancer in her maternal cousin Brain cancer in a maternal uncle and paternal grandfather Bone cancer in a maternal grandfather.  Ms. Torelli is unaware of previous family history of genetic testing for hereditary cancer risks. There is no reported Ashkenazi Jewish ancestry. There is no known consanguinity.  GENETIC COUNSELING ASSESSMENT: Ms. Soderholm is a 58 y.o. female with a personal and family history of cancer which is somewhat suggestive of a hereditary cancer syndrome. We, therefore, discussed and recommended the following at today's visit.   DISCUSSION: We discussed that, in general, most cancer is not inherited in families, but instead is sporadic or familial. Sporadic cancers occur by chance and typically happen at older ages (>50 years) as this type of cancer is caused by genetic changes acquired during an individual's lifetime. Some families have more cancers than would be expected by chance; however, the ages or types of cancer are not consistent with a known genetic mutation or known genetic mutations have been ruled out. This type of  familial cancer is thought to be due to a combination of multiple genetic, environmental, hormonal, and lifestyle factors. While this combination of factors likely increases the risk of cancer, the exact source of this risk is not currently identifiable or testable.  We discussed that 5 - 10% of cancer is hereditary.  We discussed that each type of cancer has its own risk for being hereditary, with some cancers such as pancreatic, ovarian and prostate cancer having a higher chance of having hereditary concerns, and other cancers including cervical and lung cancers having lower risks for being hereditary.  We reviewed the characteristics, features and inheritance patterns of hereditary cancer syndromes. Ms. Hoiland was concerned for a hereditary cause of lung cancer in her family.  Two genes, TP53 causing Lucillie Moselle Syndrome (LFS), and EGFR can increase the risk for hereditary lung cancer. We discussed that LFS is a rare hereditary condition that is characterized by young soft tissue cancers, most commonly breast cancer in women, brain tumors and sarcomas.  Lung cancer can also be part of this syndrome. There are no other cancers in the family that would suggest LFS as the cause of cancer in the family.  However, in rare circumstances, hereditary mutations in EGFR can cause hereditary lung cancer syndromes.  EGFR mutations are most commonly seen in lung tumors, but one mutation EGFR T790M is associated with hereditary lung cancer.  The EGFR T790M mutation is thought to be found in about 1% of NSCLC, with the median age of 35 at diagnosis.  The most common histology is adenocarcinoma.  We discussed that testing is beneficial for several reasons including knowing how to follow individuals after completing their treatment, identifying whether potential treatment options would be beneficial, and understand if other family members could be at risk for cancer and allow them to undergo genetic testing.  We also discussed  genetic testing, including the appropriate family members to test, the process of testing, insurance coverage and turn-around-time for results. We discussed the implications of a negative, positive, carrier and/or variant of uncertain significant result. Ms. Saur  was offered a common  hereditary cancer panel (36+ genes) and an expanded pan-cancer panel (70+ genes). Ms. Granlund was informed of the benefits and limitations of each panel, including that expanded pan-cancer panels contain genes that do not have clear management guidelines at this point in time.  We also discussed that as the number of genes included on a panel increases, the chances of variants of uncertain significance increases. Ms. Holzworth decided to pursue genetic testing for the CancerNext-Expanded+RNAinsight gene panel.   The CancerNext-Expanded gene panel offered by [facility] and includes sequencing, rearrangement, and RNA analysis for the following 77 genes: AIP, ALK, APC, ATM, BAP1, BARD1, BMPR1A, BRCA1, BRCA2, BRIP1, CDC73, CDH1, CDK4, CDKN1B, CDKN2A, CEBPA, CHEK2, CTNNA1, DDX41, DICER1, ETV6, FH, FLCN, GATA2, LZTR1, MAX, MBD4, MEN1, MET, MLH1, MSH2, MSH3, MSH6, MUTYH, NF1, NF2, NTHL1, PALB2, PHOX2B, PMS2, POT1, PRKAR1A, PTCH1, PTEN, RAD51C, RAD51D, RB1, RET, RPS20, RUNX1, SDHA, SDHAF2, SDHB, SDHC, SDHD, SMAD4, SMARCA4, SMARCB1, SMARCE1, STK11, SUFU, TMEM127, TP53, TSC1, TSC2, VHL, and WT1 (sequencing and deletion/duplication); AXIN2, CTNNA1, DDX41, EGFR, HOXB13, KIT, MBD4, MITF, MSH3, PDGFRA, POLD1 and POLE (sequencing only); EPCAM and GREM1 (deletion/duplication only). RNA data is routinely analyzed for use in variant interpretation for all genes.   Based on Ms. Rigsby's personal and family history of cancer, she meets medical criteria for genetic testing. Despite that she meets criteria, she may still have an out of pocket cost. We discussed that if her out of pocket cost for testing is over $100, the laboratory will call and confirm  whether she wants to proceed with testing.  If the out of pocket cost of testing is less than $100 she will be billed by the genetic testing laboratory.   We discussed that some people do not want to undergo genetic testing due to fear of genetic discrimination.  The Genetic Information Nondiscrimination Act (GINA) was signed into federal law in 2008. GINA prohibits health insurers and most employers from discriminating against individuals based on genetic information (including the results of genetic tests and family history information). According to GINA, health insurance companies cannot consider genetic information to be a preexisting condition, nor can they use it to make decisions regarding coverage or rates. GINA also makes it illegal for most employers to use genetic information in making decisions about hiring, firing, promotion, or terms of employment. It is important to note that GINA does not offer protections for life insurance, disability insurance, or long-term care insurance. GINA does not apply to those in the [employer], those who work for companies with less than 15 employees, and new life insurance or long-term disability insurance policies.  Health status due to a cancer diagnosis is not protected under GINA. More information about GINA can be found by visiting eliteclients.be.  PLAN:  After considering the risks, benefits, and limitations, Ms. Mchale provided informed consent to pursue genetic testing and the blood sample was sent to [facility] for analysis of the CancerNext-Expanded+RNA.  Results should be available within approximately 2-3 weeks' time, at which point they will be disclosed by telephone to Ms. Casasola, as will any additional recommendations warranted by these results. Ms. Fiorenza will receive a summary of her genetic counseling visit and a copy of her results once available. This information will also be available in Epic.  Financial Assistance was applied  for and accepted.  The anticipated out of pocket cost is $0.  Lastly, we encouraged Ms. Kalas to remain in contact with cancer genetics annually so that we can continuously update the family history  and inform her of any changes in cancer genetics and testing that may be of benefit for this family.   Ms. Adduci questions were answered to her satisfaction today. Our contact information was provided should additional questions or concerns arise. Thank you for the referral and allowing us  to share in the care of your patient.   Dillian Feig P. Perri, MS, CGC Licensed, Patent Attorney [email or web address] phone: [phone]  I personally spent a total of 56 minutes in the care of the patient today including preparing to see the patient, getting/reviewing separately obtained history, counseling and educating, placing orders, referring and communicating with other health care professionals, and documenting clinical information in the EHR. The patient brought her daughter. Drs. Lanny Stalls, and/or Gudena were available for questions, if needed..    _______________________________________________________________________ For Office Staff:  Number of people involved in session: 2 Was an Intern/ student involved with case: no

## 2024-10-31 ENCOUNTER — Ambulatory Visit
Admission: RE | Admit: 2024-10-31 | Discharge: 2024-10-31 | Attending: Radiation Oncology | Admitting: Radiation Oncology

## 2024-10-31 ENCOUNTER — Other Ambulatory Visit: Payer: Self-pay

## 2024-10-31 DIAGNOSIS — Z510 Encounter for antineoplastic radiation therapy: Secondary | ICD-10-CM | POA: Diagnosis not present

## 2024-10-31 LAB — RAD ONC ARIA SESSION SUMMARY
Course Elapsed Days: 0
Plan Fractions Treated to Date: 1
Plan Prescribed Dose Per Fraction: 5 Gy
Plan Total Fractions Prescribed: 10
Plan Total Prescribed Dose: 50 Gy
Reference Point Dosage Given to Date: 5 Gy
Reference Point Session Dosage Given: 5 Gy
Session Number: 1

## 2024-11-01 ENCOUNTER — Other Ambulatory Visit: Payer: Self-pay

## 2024-11-01 ENCOUNTER — Ambulatory Visit
Admission: RE | Admit: 2024-11-01 | Discharge: 2024-11-01 | Attending: Radiation Oncology | Admitting: Radiation Oncology

## 2024-11-01 ENCOUNTER — Ambulatory Visit: Admitting: Radiation Oncology

## 2024-11-01 DIAGNOSIS — Z510 Encounter for antineoplastic radiation therapy: Secondary | ICD-10-CM | POA: Diagnosis not present

## 2024-11-01 LAB — RAD ONC ARIA SESSION SUMMARY
Course Elapsed Days: 1
Plan Fractions Treated to Date: 2
Plan Prescribed Dose Per Fraction: 5 Gy
Plan Total Fractions Prescribed: 10
Plan Total Prescribed Dose: 50 Gy
Reference Point Dosage Given to Date: 10 Gy
Reference Point Session Dosage Given: 5 Gy
Session Number: 2

## 2024-11-02 ENCOUNTER — Other Ambulatory Visit: Payer: Self-pay

## 2024-11-02 ENCOUNTER — Ambulatory Visit: Admitting: Radiation Oncology

## 2024-11-02 ENCOUNTER — Ambulatory Visit
Admission: RE | Admit: 2024-11-02 | Discharge: 2024-11-02 | Attending: Radiation Oncology | Admitting: Radiation Oncology

## 2024-11-02 DIAGNOSIS — Z510 Encounter for antineoplastic radiation therapy: Secondary | ICD-10-CM | POA: Diagnosis not present

## 2024-11-02 LAB — RAD ONC ARIA SESSION SUMMARY
Course Elapsed Days: 2
Plan Fractions Treated to Date: 3
Plan Prescribed Dose Per Fraction: 5 Gy
Plan Total Fractions Prescribed: 10
Plan Total Prescribed Dose: 50 Gy
Reference Point Dosage Given to Date: 15 Gy
Reference Point Session Dosage Given: 5 Gy
Session Number: 3

## 2024-11-03 ENCOUNTER — Ambulatory Visit: Admitting: Radiation Oncology

## 2024-11-03 ENCOUNTER — Ambulatory Visit
Admission: RE | Admit: 2024-11-03 | Discharge: 2024-11-03 | Attending: Radiation Oncology | Admitting: Radiation Oncology

## 2024-11-03 ENCOUNTER — Other Ambulatory Visit: Payer: Self-pay

## 2024-11-03 DIAGNOSIS — Z510 Encounter for antineoplastic radiation therapy: Secondary | ICD-10-CM | POA: Diagnosis not present

## 2024-11-03 LAB — RAD ONC ARIA SESSION SUMMARY
Course Elapsed Days: 3
Plan Fractions Treated to Date: 4
Plan Prescribed Dose Per Fraction: 5 Gy
Plan Total Fractions Prescribed: 10
Plan Total Prescribed Dose: 50 Gy
Reference Point Dosage Given to Date: 20 Gy
Reference Point Session Dosage Given: 5 Gy
Session Number: 4

## 2024-11-06 ENCOUNTER — Other Ambulatory Visit: Payer: Self-pay

## 2024-11-06 ENCOUNTER — Ambulatory Visit: Admitting: Radiation Oncology

## 2024-11-06 ENCOUNTER — Ambulatory Visit
Admission: RE | Admit: 2024-11-06 | Discharge: 2024-11-06 | Attending: Radiation Oncology | Admitting: Radiation Oncology

## 2024-11-06 DIAGNOSIS — Z510 Encounter for antineoplastic radiation therapy: Secondary | ICD-10-CM | POA: Diagnosis not present

## 2024-11-06 LAB — RAD ONC ARIA SESSION SUMMARY
Course Elapsed Days: 6
Plan Fractions Treated to Date: 5
Plan Prescribed Dose Per Fraction: 5 Gy
Plan Total Fractions Prescribed: 10
Plan Total Prescribed Dose: 50 Gy
Reference Point Dosage Given to Date: 25 Gy
Reference Point Session Dosage Given: 5 Gy
Session Number: 5

## 2024-11-07 ENCOUNTER — Ambulatory Visit
Admission: RE | Admit: 2024-11-07 | Discharge: 2024-11-07 | Attending: Radiation Oncology | Admitting: Radiation Oncology

## 2024-11-07 ENCOUNTER — Telehealth: Payer: Self-pay | Admitting: Genetic Counselor

## 2024-11-07 ENCOUNTER — Other Ambulatory Visit: Payer: Self-pay

## 2024-11-07 ENCOUNTER — Encounter: Payer: Self-pay | Admitting: Genetic Counselor

## 2024-11-07 ENCOUNTER — Ambulatory Visit: Payer: Self-pay | Admitting: Genetic Counselor

## 2024-11-07 DIAGNOSIS — Z1379 Encounter for other screening for genetic and chromosomal anomalies: Secondary | ICD-10-CM | POA: Insufficient documentation

## 2024-11-07 DIAGNOSIS — Z510 Encounter for antineoplastic radiation therapy: Secondary | ICD-10-CM | POA: Diagnosis not present

## 2024-11-07 LAB — RAD ONC ARIA SESSION SUMMARY
Course Elapsed Days: 7
Plan Fractions Treated to Date: 6
Plan Prescribed Dose Per Fraction: 5 Gy
Plan Total Fractions Prescribed: 10
Plan Total Prescribed Dose: 50 Gy
Reference Point Dosage Given to Date: 30 Gy
Reference Point Session Dosage Given: 5 Gy
Session Number: 6

## 2024-11-07 NOTE — Telephone Encounter (Signed)
 I contacted  [name] to discuss her genetic testing results. No pathogenic variants were identified in the 77 genes analyzed. Discussed that we do not know why she has lung cancer or why there is cancer in the family. It could be due to a different gene that we are not testing, or maybe our current technology may not be able to pick something up.  It will be important for her to keep in contact with genetics to keep up with whether additional testing may be needed.Detailed clinic note to follow.   The test report will be scanned into EPIC and will be located under the Molecular Pathology section of the Results Review tab.  A portion of the result report is included below for reference.

## 2024-11-07 NOTE — Progress Notes (Signed)
 HPI:  [name] was previously seen in the [facility] due to a personal and family history of lung cancer and family history of other cancers and concerns regarding a hereditary predisposition to cancer. Please refer to our prior cancer genetics clinic note for more information regarding our discussion, assessment and recommendations, at the time. [name] recent genetic test results were disclosed to her, as were recommendations warranted by these results. These results and recommendations are discussed in more detail below.  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 The report date is 11/03/2024.  The CancerNext-Expanded gene panel offered by [facility] and includes sequencing, rearrangement, and RNA analysis for the following 77 genes: AIP, ALK, APC, ATM, BAP1, BARD1, BMPR1A, BRCA1, BRCA2, BRIP1, CDC73, CDH1, CDK4, CDKN1B, CDKN2A, CEBPA, CHEK2, CTNNA1, DDX41, DICER1, ETV6, FH, FLCN, GATA2, LZTR1, MAX, MBD4, MEN1, MET, MLH1, MSH2, MSH3, MSH6, MUTYH, NF1, NF2, NTHL1, PALB2, PHOX2B, PMS2, POT1, PRKAR1A, PTCH1, PTEN, RAD51C, RAD51D, RB1, RET, RPS20, RUNX1, SDHA, SDHAF2, SDHB, SDHC, SDHD, SMAD4, SMARCA4, SMARCB1, SMARCE1, STK11, SUFU, TMEM127, TP53, TSC1, TSC2, VHL, and WT1 (sequencing and deletion/duplication); AXIN2, CTNNA1, DDX41, EGFR, HOXB13, KIT, MBD4, MITF, MSH3, PDGFRA, POLD1 and POLE (sequencing only); EPCAM and GREM1 (deletion/duplication only). RNA data is routinely analyzed for use in variant interpretation for all genes.      FAMILY HISTORY:  We obtained a detailed, 4-generation  family history.  Significant diagnoses are listed below: Family History  Problem Relation Age of Onset   Cervical cancer Mother    Lung cancer Mother        d. 65   COPD Mother    Cirrhosis Father        d. mid 10s   COPD Father    Lung cancer Half-Brother        lung   Coronary artery disease Half-Brother    COPD Half-Brother    Pneumonia Half-Brother    Migraines Half-Brother    Lung cancer Maternal Aunt    Brain cancer Maternal Uncle    Cervical cancer Maternal Grandmother        cervical   Lung cancer Maternal Grandmother    Bone cancer Maternal Grandfather    Polycystic kidney disease Paternal Grandmother    Brain cancer Paternal Grandfather    Diabetes Other    Cancer Other    Hypertension Other    Kidney cancer Son 19   Polycystic kidney disease Son        Significant reported conditions in the family include: Polycystic kidney disease in her son and paternal grandmother Kidney cancer in her son Lung cancer in her maternal half brother, mother, maternal aunt and maternal grandmother Fallopian tube cancer in her maternal cousin Brain cancer in a maternal uncle and paternal grandfather Bone cancer in a maternal grandfather.   [name] is unaware of previous family history of genetic testing for hereditary cancer risks. There is no reported Ashkenazi Jewish ancestry. There is no known consanguinity.  GENETIC TEST RESULTS: Genetic testing reported out on November 05, 2024 through the CancerNext-Expanded+RNAinsight cancer panel found no pathogenic mutations. The CancerNext-Expanded  gene panel offered by [facility] and includes sequencing, rearrangement, and RNA analysis for the following 77 genes: AIP, ALK, APC, ATM, BAP1, BARD1, BMPR1A, BRCA1, BRCA2, BRIP1, CDC73, CDH1, CDK4, CDKN1B, CDKN2A, CEBPA, CHEK2, CTNNA1, DDX41, DICER1, ETV6, FH, FLCN, GATA2, LZTR1, MAX, MBD4, MEN1, MET, MLH1, MSH2, MSH3, MSH6, MUTYH, NF1, NF2, NTHL1, PALB2, PHOX2B, PMS2, POT1, PRKAR1A,  PTCH1, PTEN, RAD51C, RAD51D, RB1, RET, RPS20, RUNX1, SDHA, SDHAF2, SDHB, SDHC, SDHD, SMAD4, SMARCA4, SMARCB1, SMARCE1, STK11, SUFU, TMEM127, TP53, TSC1, TSC2, VHL, and WT1 (sequencing and deletion/duplication); AXIN2, CTNNA1, DDX41, EGFR, HOXB13, KIT, MBD4, MITF, MSH3, PDGFRA, POLD1 and POLE (sequencing only); EPCAM and GREM1 (deletion/duplication only). RNA data is routinely analyzed for use in variant interpretation for all genes. The test report has been scanned into EPIC and is located under the Molecular Pathology section of the Results Review tab.  A portion of the [name] report is included below for reference.     We discussed with [name] that because current genetic testing is not perfect, it is possible there may be a gene mutation in one of these genes that current testing cannot detect, but that chance is small.  We also discussed, that there could be another gene that has not yet been discovered, or that we have not yet tested, that is responsible for the cancer diagnoses in the family. It is also possible there is a hereditary cause for the cancer in the family that [name] and therefore was not identified in her testing.  Therefore, it is important to remain in touch with cancer genetics in the future so that we can continue to offer [name] the most up to date genetic testing.   ADDITIONAL GENETIC TESTING: We discussed with [name] that her genetic testing was fairly extensive.  If there are genes identified to increase cancer risk that can be analyzed in the future, we would be happy to discuss and coordinate this testing at that time.    CANCER SCREENING RECOMMENDATIONS: [name] is considered negative (normal).  This means that we have not identified a hereditary cause for her personal and family history of lung cancer at this time. Most cancers happen by chance and this negative test suggests that her personal and family history of cancer may fall  into this category.    Possible reasons for [name] negative genetic test include:  1. There may be a gene mutation in one of these genes that current testing methods cannot detect but that chance is small.  2. There could be another gene that has not yet been discovered, or that we have not yet tested, that is responsible for the cancer diagnoses in the family.  3.  There may be no hereditary risk for cancer in the family. The cancers in [name] and/or her family may be sporadic/familial or due to other genetic and environmental factors. 4. It is also possible there is a hereditary cause for the cancer in the family that [name] did not [name].  Therefore, it is recommended she continue to follow the cancer management and screening guidelines provided by her oncology and primary healthcare provider. An individual's cancer risk and medical management are not determined by genetic test results alone. Overall cancer risk assessment incorporates additional factors, including personal medical history, family history, and any available genetic information that may [name] in a personalized plan for cancer prevention and surveillance  RECOMMENDATIONS FOR FAMILY MEMBERS:   Since she did not [name] a  identifiable mutation in a cancer predisposition gene included on this panel, her children could not have inherited a known mutation from her in one of these genes. Individuals in this family might be at some increased risk of developing cancer, over the general population risk, simply due to the family history of cancer.  We recommended women in this family have a yearly mammogram beginning at age 36, or 39 years younger than the earliest onset of cancer, an annual clinical breast exam, and perform monthly breast self-exams. Women in this family should also have a gynecological exam as recommended by their primary provider. All family members should be referred for colonoscopy starting at age 80, or 65  years younger than the earliest onset of cancer.  FOLLOW-UP: Lastly, we discussed with [name] that cancer genetics is a rapidly advancing field and it is possible that new genetic tests will be appropriate for her and/or her family members in the future. We encouraged her to remain in contact with cancer genetics on an annual basis so we can update her personal and family histories and let her know of advances in cancer genetics that may benefit this family.   Our contact number was provided. Ms. [place]on questions were answered to her satisfaction, and she knows she is welcome to call us  at anytime with additional questions or concerns.   Darice Monte, MS, [facility] Licensed, Certified Genetic Counselor [email or web address]

## 2024-11-08 ENCOUNTER — Ambulatory Visit
Admission: RE | Admit: 2024-11-08 | Discharge: 2024-11-08 | Attending: Radiation Oncology | Admitting: Radiation Oncology

## 2024-11-08 ENCOUNTER — Ambulatory Visit: Admitting: Radiation Oncology

## 2024-11-08 ENCOUNTER — Other Ambulatory Visit: Payer: Self-pay

## 2024-11-08 DIAGNOSIS — Z510 Encounter for antineoplastic radiation therapy: Secondary | ICD-10-CM | POA: Diagnosis not present

## 2024-11-08 LAB — RAD ONC ARIA SESSION SUMMARY
Course Elapsed Days: 8
Plan Fractions Treated to Date: 7
Plan Prescribed Dose Per Fraction: 5 Gy
Plan Total Fractions Prescribed: 10
Plan Total Prescribed Dose: 50 Gy
Reference Point Dosage Given to Date: 35 Gy
Reference Point Session Dosage Given: 4.5426 Gy
Session Number: 7

## 2024-11-09 ENCOUNTER — Other Ambulatory Visit: Payer: Self-pay

## 2024-11-09 ENCOUNTER — Ambulatory Visit: Admission: RE | Admit: 2024-11-09

## 2024-11-09 DIAGNOSIS — Z510 Encounter for antineoplastic radiation therapy: Secondary | ICD-10-CM | POA: Diagnosis not present

## 2024-11-09 LAB — RAD ONC ARIA SESSION SUMMARY
Course Elapsed Days: 9
Plan Fractions Treated to Date: 8
Plan Prescribed Dose Per Fraction: 5 Gy
Plan Total Fractions Prescribed: 10
Plan Total Prescribed Dose: 50 Gy
Reference Point Dosage Given to Date: 40 Gy
Reference Point Session Dosage Given: 5 Gy
Session Number: 8

## 2024-11-10 ENCOUNTER — Ambulatory Visit
Admission: RE | Admit: 2024-11-10 | Discharge: 2024-11-10 | Attending: Radiation Oncology | Admitting: Radiation Oncology

## 2024-11-10 ENCOUNTER — Ambulatory Visit: Admitting: Radiation Oncology

## 2024-11-10 ENCOUNTER — Other Ambulatory Visit: Payer: Self-pay

## 2024-11-10 DIAGNOSIS — Z510 Encounter for antineoplastic radiation therapy: Secondary | ICD-10-CM | POA: Diagnosis not present

## 2024-11-10 LAB — RAD ONC ARIA SESSION SUMMARY
Course Elapsed Days: 10
Plan Fractions Treated to Date: 9
Plan Prescribed Dose Per Fraction: 5 Gy
Plan Total Fractions Prescribed: 10
Plan Total Prescribed Dose: 50 Gy
Reference Point Dosage Given to Date: 45 Gy
Reference Point Session Dosage Given: 5 Gy
Session Number: 9

## 2024-11-13 ENCOUNTER — Other Ambulatory Visit: Payer: Self-pay

## 2024-11-13 ENCOUNTER — Other Ambulatory Visit: Payer: Self-pay | Admitting: Radiation Oncology

## 2024-11-13 ENCOUNTER — Ambulatory Visit

## 2024-11-13 ENCOUNTER — Ambulatory Visit
Admission: RE | Admit: 2024-11-13 | Discharge: 2024-11-13 | Disposition: A | Discharge status: Home / Self Care | Source: Ambulatory Visit | Attending: Radiation Oncology | Admitting: Radiation Oncology

## 2024-11-13 DIAGNOSIS — Z510 Encounter for antineoplastic radiation therapy: Secondary | ICD-10-CM | POA: Diagnosis not present

## 2024-11-13 LAB — RAD ONC ARIA SESSION SUMMARY
Course Elapsed Days: 13
Plan Fractions Treated to Date: 10
Plan Prescribed Dose Per Fraction: 5 Gy
Plan Total Fractions Prescribed: 10
Plan Total Prescribed Dose: 50 Gy
Reference Point Dosage Given to Date: 50 Gy
Reference Point Session Dosage Given: 5 Gy
Session Number: 10

## 2024-11-13 MED ORDER — OXYCODONE-ACETAMINOPHEN 5-325 MG PO TABS: 1.0000 | ORAL_TABLET | Freq: Four times a day (QID) | ORAL | 0 refills | Status: AC | PRN

## 2024-11-13 MED ORDER — LIDOCAINE VISCOUS HCL 2 % MT SOLN: 15.0000 mL | Freq: Three times a day (TID) | OROMUCOSAL | 2 refills | Status: AC

## 2024-11-14 NOTE — Radiation Completion Notes (Addendum)
" °  Radiation Oncology         [phone] ________________________________  Name: [name] MRN: [id number]  Date of Service: 11/13/2024  DOB: 10-21-1966  End of Treatment Note   Diagnosis: Stage IA2 (cT1b, N0, M0) NSCLC, Squamous cell carcinoma of the left upper lung  Intent: Curative     ==========DELIVERED PLANS==========  First Treatment Date: 2024-10-31 Last Treatment Date: 2024-11-13   Plan Name: Lung_L Site: Lung, Left Technique: IMRT Mode: Photon Dose Per Fraction: 5 Gy Prescribed Dose (Delivered / Prescribed): 50 Gy / 50 Gy Prescribed Fxs (Delivered / Prescribed): 10 / 10     ====================================   The patient tolerated radiation. She experienced shortness of breath and a productive cough throughout her treatment and developed a sore throat and fatigue.   The patient will return in one month and will continue follow up with Dr. Sherrod as well.      Ronita Due, PA-C  "

## 2024-12-09 ENCOUNTER — Ambulatory Visit
Admission: EM | Admit: 2024-12-09 | Discharge: 2024-12-09 | Disposition: A | Discharge status: Home / Self Care | Attending: Emergency Medicine | Admitting: Emergency Medicine

## 2024-12-09 DIAGNOSIS — F17210 Nicotine dependence, cigarettes, uncomplicated: Secondary | ICD-10-CM

## 2024-12-09 DIAGNOSIS — C3492 Malignant neoplasm of unspecified part of left bronchus or lung: Secondary | ICD-10-CM

## 2024-12-09 DIAGNOSIS — J441 Chronic obstructive pulmonary disease with (acute) exacerbation: Secondary | ICD-10-CM

## 2024-12-09 DIAGNOSIS — G893 Neoplasm related pain (acute) (chronic): Secondary | ICD-10-CM | POA: Diagnosis not present

## 2024-12-09 MED ORDER — KETOROLAC TROMETHAMINE 30 MG/ML IJ SOLN
30.0000 mg | Freq: Once | INTRAMUSCULAR | Status: AC
  Administered 2024-12-09: 30 mg via INTRAMUSCULAR

## 2024-12-09 NOTE — Discharge Instructions (Addendum)
 Please go to the ED now for further evaluation of acute COPD exacerbation.  The nearest ED to this urgent care location is [facility] at [address], [place], [place].

## 2024-12-09 NOTE — ED Notes (Signed)
 Patient is being discharged from the Urgent Care and sent to the Emergency Department via POV with daughter. Per Joesph, [place], patient is in need of higher level of care due to needing x-ray and SOB. Patient is aware and verbalizes understanding of plan of care.  Vitals:   12/09/24 1456  BP: 108/74  Pulse: (!) 111  Resp: (!) 24  Temp: 98.5 F (36.9 C)  SpO2: (S) 93%

## 2024-12-09 NOTE — ED Triage Notes (Signed)
 Patient presents to UC for right ear pain x 1 week. Noted bleeding this morning. Also reports SOB, hx of copd and emphysema. She states she uses 3L [place] and normal 02 range for her is 95-96% 02. Hx of pneumonia. Treating her SOB with her inhaler with some relief.

## 2024-12-09 NOTE — ED Provider Notes (Addendum)
 "    [name]    CSN: [id number] Arrival date & time: 12/09/24  1444    HISTORY   Chief Complaint  Patient presents with   Otalgia   Shortness of Breath   HPI [name] is a pleasant, 59 y.o. female who presents to urgent care today. Pt c/o right ear pain for 1 week.  Patient states that when using a Q-tip to clean out her ear this morning, Joyce noticed a bit of dried blood on the Q-tip and since then has had bright red blood coming out of her right ear.  Patient denies hearing loss, fever.  Patient complains of increased shortness of breath, reports history of COPD, emphysema, cigarette smoking and pneumonia.  EMR reviewed by me, patient has just completed radiation treatment for squamous cell carcinoma of lung.  Patient states Joyce typically has an SpO2 of 95% on 3 L O2 via nasal cannula, on arrival, her SpO2 was at 90% and increased to 93% with deep breathing.  The history is provided by the patient.  Otalgia  Past Medical History:  Diagnosis Date   Angina    Anxiety state 10/15/2015   ARDS (adult respiratory distress syndrome) (HCC)    Jan 2011   Asthma    Chronic back pain    COPD (chronic obstructive pulmonary disease) (HCC)    Diabetes mellitus    Encounter for smoking cessation counseling 02/21/2024   Family history of fallopian tube cancer    Family history of kidney cancer    Family history of lung cancer    Gallstones with biliary obstruction    GERD (gastroesophageal reflux disease) 12/21/2012   HTN (hypertension) 10/15/2015   Hyperglycemia, drug-induced    steroid induced hyperglycemia   Migraine headache    On home O2    Pneumonia    Recurrent upper respiratory infection (URI)    Shortness of breath    Patient Active Problem List   Diagnosis Date Noted   Genetic testing 11/07/2024   Family history of lung cancer    Family history of kidney cancer    Family history of fallopian tube cancer    Squamous cell carcinoma of upper lobe of left lung  (HCC) 10/09/2024   Abnormal chest x-ray with multiple lung nodules 09/08/2024   Pharmacologic therapy 09/06/2024   Disorder of skeletal system 09/06/2024   Problems influencing health status 09/06/2024   Abnormal drug screen 09/06/2024   Positive urine drug screen 09/06/2024   Abnormal CT scan, cervical spine (03/30/2022) 09/06/2024   Emphysema of lung (HCC) 09/06/2024   Abnormal CT scan, lumbar spine (03/30/2022) 09/06/2024   Uncontrolled type 2 diabetes mellitus with hyperglycemia, with long-term current use of insulin  (HCC) 08/10/2024   Tobacco abuse 08/09/2024   Chronic respiratory failure with hypoxia and hypercapnia (HCC) 08/09/2024   CAP (community acquired pneumonia) 02/21/2024   Encounter for smoking cessation counseling 02/21/2024   Choledocholithiasis 01/05/2024   Choledocholithiasis with acute cholecystitis 01/04/2024   Cholecystitis 01/04/2024   Acute pyelonephritis 01/04/2024   Acute on chronic respiratory failure (HCC) 04/20/2023   Hyperlipidemia 04/20/2023   Hypokalemia 04/20/2023   Hyponatremia 04/20/2023   Atypical chest pain 04/20/2023   ERRONEOUS ENCOUNTER--DISREGARD 10/30/2022   Electrocardiogram abnormal 04/22/2022   Arthralgia 04/22/2022   Polyneuropathy due to type 2 diabetes mellitus (HCC) 08/22/2021   Oxygen  dependent 08/22/2021   Migraine 08/22/2021   Sepsis due to undetermined organism (HCC) 08/17/2021   Chronic respiratory failure with hypoxia (HCC) 04/23/2020  Chronic pain syndrome 11/27/2019   Lobar pneumonia 11/27/2019   Atypical pneumonia - SEPSIS RULED OUT 09/28/2016   Chronic obstructive pulmonary disease (HCC)    Sepsis (HCC) 10/15/2015   Anxiety state 10/15/2015   Essential hypertension 10/15/2015   COPD with acute exacerbation (HCC) 08/14/2014   Community acquired pneumonia 03/11/2014   COPD exacerbation (HCC) 04/22/2013   GERD (gastroesophageal reflux disease) 12/21/2012   Candida infection 11/26/2011   DM (diabetes mellitus) (HCC)  11/21/2011   Oral candidiasis 07/03/2011   Cigarette smoker 09/12/2010   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  02/14/2010   ADULT RESPIRATORY DISTRESS SYNDROME 02/13/2010   DELIRIUM 02/13/2010   Neck pain 05/29/2004   Past Surgical History:  Procedure Laterality Date   BILIARY DILATION  01/04/2024   Procedure: BILIARY DILATION;  Surgeon: Jinny Carmine, MD;  Location: [facility] ENDOSCOPY;  Service: Endoscopy;;   BRONCHIAL BIOPSY  09/25/2024   Procedure: BRONCHOSCOPY, WITH BIOPSY;  Surgeon: Shelah Lamar RAMAN, MD;  Location: [facility] ENDOSCOPY;  Service: Pulmonary;;   BRONCHIAL NEEDLE ASPIRATION BIOPSY  09/25/2024   Procedure: BRONCHOSCOPY, WITH NEEDLE ASPIRATION BIOPSY;  Surgeon: Shelah Lamar RAMAN, MD;  Location: [facility] ENDOS[facility];  Service: Pulmonary;;   c-section     ENDOSCOPIC RETROGRADE CHOLANGIOPANCREATOGRAPHY (ERCP) WITH PROPOFOL  N/A 01/04/2024   Procedure: ENDOSCOPIC RETROGRADE CHOLANGIOPANCREATOGRAPHY (ERCP) WITH PROPOFOL ;  Surgeon: Jinny Carmine, MD;  Location: [facility] ENDOSCOPY;  Service: Endoscopy;  Laterality: N/A;   REMOVAL OF STONES  01/04/2024   Procedure: REMOVAL OF STONES;  Surgeon: Jinny Carmine, MD;  Location: [facility] ENDOSCOPY;  Service: Endoscopy;;   TRACHEOSTOMY     decannulated 12/2009   TUBAL LIGATION     uterine ablasion     VIDEO BRONCHOSCOPY WITH ENDOBRONCHIAL NAVIGATION Left 09/25/2024   Procedure: VIDEO BRONCHOSCOPY WITH ENDOBRONCHIAL NAVIGATION;  Surgeon: Shelah Lamar RAMAN, MD;  Location: [facility] ENDOSCOPY;  Service: Pulmonary;  Laterality: Left;   OB History     Gravida      Para      Term      Preterm      AB      Living  3      SAB      IAB      Ectopic      Multiple      Live Births             Home Medications    Prior to Admission medications  Medication Sig Start Date End Date Taking? Authorizing Provider  albuterol  (PROVENTIL ) (2.5 MG/3ML) 0.083% nebulizer solution Take 3 mLs (2.5 mg total) by nebulization every 6 (six) hours as needed for wheezing or  shortness of breath. 03/13/22   Pearlean Manus, MD  albuterol  (VENTOLIN  HFA) 108 (90 Base) MCG/ACT inhaler Inhale 2 puffs into the lungs every 4 (four) hours as needed for wheezing or shortness of breath. 03/13/22   Pearlean, Courage, MD  azithromycin  (ZITHROMAX ) 250 MG tablet Take 2 today and one daily for the next 4 days. 10/02/24   Ruthell Lauraine FALCON, NP  glipiZIDE  (GLUCOTROL ) 10 MG tablet Take 20 mg by mouth daily before breakfast. 06/27/24   [provider]  insulin  aspart (NOVOLOG  FLEXPEN) 100 UNIT/ML FlexPen 3 TIMES A DAY WITH MEALS SUGAR  70-120--no units, 121-150--3 units, 151-200--4 units; 201-250--7 units, 251-300--11 units; 301-350 15 units; 351-400 20 units 08/11/24   Tat, Alm, MD  LANTUS  SOLOSTAR 100 UNIT/ML Solostar Pen Inject 30 Units into the skin at bedtime. 08/11/24   Evonnie Alm, MD  lidocaine  (XYLOCAINE ) 2 %  solution Use as directed 15 mLs in the mouth or throat 3 (three) times daily with meals. Swallow 20 min before meals 11/13/24   Shannon Agent, MD  ondansetron  (ZOFRAN -ODT) 4 MG disintegrating tablet 4mg  ODT q4 hours prn nausea/vomit Patient taking differently: Take 4 mg by mouth every 4 (four) hours as needed for nausea or vomiting. 4mg  ODT q4 hours prn nausea/vomit 04/19/22   Zammit, Joseph, MD  oxyCODONE -acetaminophen  (PERCOCET/ROXICET) 5-325 MG tablet Take 1 tablet by mouth every 6 (six) hours as needed for severe pain (pain score 7-10). 11/13/24   Shannon Agent, MD  pantoprazole  (PROTONIX ) 40 MG tablet Take 1 tablet (40 mg total) by mouth every other day. 09/25/24   Shelah Lamar RAMAN, MD  predniSONE  (DELTASONE ) 10 MG tablet Prednisone  taper; 10 mg tablets: 4 tabs x 2 days, 3 tabs x 2 days, 2 tabs x 2 days 1 tab x 2 days then stop. 10/02/24   Groce, Sarah F, NP  rOPINIRole  (REQUIP ) 2 MG tablet Take 2 mg by mouth 3 (three) times daily. 03/09/23   [provider]  rosuvastatin  (CRESTOR ) 40 MG tablet Take 40 mg by mouth daily. 08/17/23   [provider]   SUMAtriptan  (IMITREX ) 50 MG tablet Take 50 mg by mouth as needed for migraine or headache. 11/29/21   [provider]  SYMBICORT  80-4.5 MCG/ACT inhaler Inhale 2 puffs into the lungs 2 (two) times daily. 12/28/23   [provider]  ARIPiprazole  (ABILIFY ) 5 MG tablet Take 5 mg by mouth daily.    02/03/12  [provider]  venlafaxine  (EFFEXOR ) 75 MG tablet Take 75 mg by mouth daily.    02/03/12  [provider]    Family History Family History  Problem Relation Age of Onset   Cervical cancer Mother    Lung cancer Mother        d. 46   COPD Mother    Cirrhosis Father        d. mid 27s   COPD Father    Lung cancer Half-Brother        lung   Coronary artery disease Half-Brother    COPD Half-Brother    Pneumonia Half-Brother    Migraines Half-Brother    Lung cancer Maternal Aunt    Brain cancer Maternal Uncle    Cervical cancer Maternal Grandmother        cervical   Lung cancer Maternal Grandmother    Bone cancer Maternal Grandfather    Polycystic kidney disease Paternal Grandmother    Brain cancer Paternal Grandfather    Diabetes Other    Cancer Other    Hypertension Other    Kidney cancer Son 3   Polycystic kidney disease Son    Social History Social History[1] Allergies   Levofloxacin , Penicillins, and Doxycycline   Review of Systems Review of Systems  HENT:  Positive for ear pain.    Pertinent findings revealed after performing a 14 point review of systems has been noted in the history of present illness.  Physical Exam Vital Signs BP 108/74 (BP Location: Right Arm)   Pulse (!) 111   Temp 98.5 F (36.9 C) (Oral)   Resp (!) 24   SpO2 (S) 93% Comment: ranging from 91-93%, [place], [place] notified  No data found.  Physical Exam Vitals and nursing note reviewed.  Constitutional:      Appearance: [name].  HENT:     Head: Normocephalic and atraumatic.     Mouth/Throat:     Mouth: Mucous  membranes are moist.      Pharynx: Oropharynx is clear.  Cardiovascular:     Rate and Rhythm: Regular rhythm. Tachycardia present.  Pulmonary:     Effort: Tachypnea present.     Breath sounds: Examination of the right-upper field reveals wheezing. Examination of the left-upper field reveals wheezing. Examination of the right-middle field reveals wheezing. Examination of the left-middle field reveals wheezing. Examination of the right-lower field reveals wheezing. Examination of the left-lower field reveals wheezing. Wheezing present.  Musculoskeletal:        General: Normal range of motion.     Cervical back: Normal range of motion and neck supple.  Skin:    General: Skin is warm and dry.     Capillary Refill: Capillary refill takes less than 2 seconds.  Neurological:     General: No focal deficit present.     Mental Status: Joyce is alert and oriented to person, place, and time.  Psychiatric:        Mood and Affect: Mood normal.        Behavior: Behavior normal.     Visual Acuity Right Eye Distance:   Left Eye Distance:   Bilateral Distance:    Right Eye Near:   Left Eye Near:    Bilateral Near:     UC Couse / Diagnostics / Procedures:     Radiology No results found.  Procedures Procedures (including critical care time) EKG  Pending results:  Labs Reviewed - No data to display  Medications Ordered in UC: Medications  ketorolac  (TORADOL ) 30 MG/ML injection 30 mg (30 mg Intramuscular Given 12/09/24 1520)    UC Diagnoses / Final Clinical Impressions(s)   I have reviewed the triage vital signs and the nursing notes.  Pertinent labs & imaging results that were available during my care of the patient were reviewed by me and considered in my medical decision making (see chart for details).    Final diagnoses:  COPD exacerbation (HCC)  Chronic pain due to neoplasm  Squamous cell carcinoma of left lung (HCC)  Tobacco dependence due to cigarettes   Patient and family member advised that I  recommend that Joyce needs further evaluation of her decreased SpO2 given that Joyce just completed radiation, has a history of pneumonia, has decreased SpO2 on 3 L.  Because we do not have a radiology technician here at this location today to perform chest x-ray, patient was advised that we could send her to another location to have chest x-ray performed.  Patient was further advised that given the lateness of the hour we will be unable to provide her with the results of her x-ray until tomorrow morning and therefore recommend that Joyce go to the emergency room now for further evaluation.  Patient and family member were agreeable to this recommendation.  Patient deemed stable for transport via private vehicle.  Prior to d/c, pt requested inj of ketorolac  for pain. Last GFR performed in November 2025 was >60.   Please see discharge instructions below for details of plan of care as provided to patient. ED Prescriptions   None    PDMP not reviewed this encounter.    Discharge Instructions      Please go to the ED now for further evaluation of acute COPD exacerbation.  The nearest ED to this urgent care location is [facility] at [address], [place], [place].       Disposition Upon Discharge:  Condition: stable  for discharge home  Patient presented with an acute illness with associated systemic symptoms and significant discomfort requiring urgent management. In my opinion, this is a condition that a prudent lay person (someone who possesses an average knowledge of health and medicine) may potentially expect to result in complications if not addressed urgently such as respiratory distress, impairment of bodily function or dysfunction of bodily organs.   Routine symptom specific, illness specific and/or disease specific instructions were discussed with the patient and/or caregiver at length.   As such, the patient has been evaluated and assessed,  work-up was performed and treatment was provided in alignment with urgent care protocols and evidence based medicine.  Patient/parent/caregiver has been advised that the patient may require follow up for further testing and treatment if the symptoms continue in spite of treatment, as clinically indicated and appropriate.  Patient/parent/caregiver has been advised to return to the [facility] or PCP if no better; to PCP or the Emergency Department if new signs and symptoms develop, or if the current signs or symptoms continue to change or worsen for further workup, evaluation and treatment as clinically indicated and appropriate  The patient will follow up with their current PCP if and as advised. If the patient does not currently have a PCP we will assist them in obtaining one.   The patient may need specialty follow up if the symptoms continue, in spite of conservative treatment and management, for further workup, evaluation, consultation and treatment as clinically indicated and appropriate.  Patient/parent/caregiver verbalized understanding and agreement of plan as discussed.  All questions were addressed during visit.  Please see discharge instructions below for further details of plan.  This office note has been dictated using Teaching laboratory technician.  Unfortunately, this method of dictation can sometimes lead to typographical or grammatical errors.  I apologize for your inconvenience in advance if this occurs.  Please do not hesitate to reach out to me if clarification is needed.       Joesph Shaver Scales, PA-C 12/09/24 1554     [1]  Social History Tobacco Use   Smoking status: Every Day    Current packs/day: 1.00    Average packs/day: 1 pack/day for 47.0 years (47.0 ttl pk-yrs)    Types: Cigarettes    Start date: 1979   Smokeless tobacco: Never   Tobacco comments:    1 pack a day 09/23/2024 KRD  Vaping Use   Vaping status: Never Used  Substance Use Topics   Alcohol use:  No   Drug use: No    Comment: Formerly used cocaine , narcotics, THC     Joesph Shaver [place], [place] 12/09/24 1556  "

## 2024-12-10 ENCOUNTER — Telehealth: Payer: Self-pay

## 2024-12-10 NOTE — Telephone Encounter (Signed)
 Patient and daughter called to check in about being encouraged to go the ED.

## 2024-12-13 NOTE — Progress Notes (Incomplete)
 "  Radiation Oncology         [phone] ________________________________  Name: [name] MRN: [id number]  Date: 12/19/2024  DOB: 11/21/1966  Follow-Up Visit Note  CC: Patient, No Pcp Per  No ref. provider found  No diagnosis found. ***  Diagnosis:   Stage IA2 (cT1b, N0, M0) NSCLC, Squamous cell carcinoma of the left upper lung; s/p definitive radiation completed on 11/13/2024  Interval Since Last Radiation:  1 month   ==========DELIVERED PLANS==========  First Treatment Date: 2024-10-31 Last Treatment Date: 2024-11-13   Plan Name: Lung_L Site: Lung, Left Technique: IMRT Mode: Photon Dose Per Fraction: 5 Gy Prescribed Dose (Delivered / Prescribed): 50 Gy / 50 Gy Prescribed Fxs (Delivered / Prescribed): 10 / 10  Narrative:  The patient returns today for routine follow-up. She completed her treatment approximately 1 month ago       In the interval since she was last seen, she presented to the ED with ear pain and shortness of breath on 12/09/2024. She was ultimately discharged that same day with instructions to follow up with CXR. ***  Today, ***                       ALLERGIES:  is allergic to levofloxacin , penicillins, and doxycycline .  Meds: Current Outpatient Medications  Medication Sig Dispense Refill   albuterol  (PROVENTIL ) (2.5 MG/3ML) 0.083% nebulizer solution Take 3 mLs (2.5 mg total) by nebulization every 6 (six) hours as needed for wheezing or shortness of breath. 75 mL 3   albuterol  (VENTOLIN  HFA) 108 (90 Base) MCG/ACT inhaler Inhale 2 puffs into the lungs every 4 (four) hours as needed for wheezing or shortness of breath. 18 g 1   azithromycin  (ZITHROMAX ) 250 MG tablet Take 2 today and one daily for the next 4 days. 6 tablet 0   glipiZIDE  (GLUCOTROL ) 10 MG tablet Take 20 mg by mouth daily before breakfast.     insulin  aspart (NOVOLOG  FLEXPEN) 100 UNIT/ML FlexPen 3 TIMES A DAY WITH MEALS SUGAR  70-120--no units, 121-150--3 units, 151-200--4 units; 201-250--7  units, 251-300--11 units; 301-350 15 units; 351-400 20 units 15 mL 0   LANTUS  SOLOSTAR 100 UNIT/ML Solostar Pen Inject 30 Units into the skin at bedtime. 15 mL 0   lidocaine  (XYLOCAINE ) 2 % solution Use as directed 15 mLs in the mouth or throat 3 (three) times daily with meals. Swallow 20 min before meals 100 mL 2   ondansetron  (ZOFRAN -ODT) 4 MG disintegrating tablet 4mg  ODT q4 hours prn nausea/vomit (Patient taking differently: Take 4 mg by mouth every 4 (four) hours as needed for nausea or vomiting. 4mg  ODT q4 hours prn nausea/vomit) 10 tablet 0   oxyCODONE -acetaminophen  (PERCOCET/ROXICET) 5-325 MG tablet Take 1 tablet by mouth every 6 (six) hours as needed for severe pain (pain score 7-10). 20 tablet 0   pantoprazole  (PROTONIX ) 40 MG tablet Take 1 tablet (40 mg total) by mouth every other day.     predniSONE  (DELTASONE ) 10 MG tablet Prednisone  taper; 10 mg tablets: 4 tabs x 2 days, 3 tabs x 2 days, 2 tabs x 2 days 1 tab x 2 days then stop. 20 tablet 0   rOPINIRole  (REQUIP ) 2 MG tablet Take 2 mg by mouth 3 (three) times daily.     rosuvastatin  (CRESTOR ) 40 MG tablet Take 40 mg by mouth daily.     SUMAtriptan  (IMITREX ) 50 MG tablet Take 50 mg by mouth as needed for migraine or headache.  SYMBICORT  80-4.5 MCG/ACT inhaler Inhale 2 puffs into the lungs 2 (two) times daily.     No current facility-administered medications for this visit.    Physical Findings:  vitals were not taken for this visit. .   The patient is in no acute distress. Patient is alert and oriented. No significant changes. Lungs are clear to auscultation bilaterally. Heart has regular rate and rhythm. No palpable cervical, supraclavicular, or axillary adenopathy. Abdomen soft, non-tender, normal bowel sounds. ***  Lab Findings: Lab Results  Component Value Date   WBC 9.7 10/16/2024   HGB 12.4 10/16/2024   HCT 41.2 10/16/2024   MCV 90.2 10/16/2024   PLT 239 10/16/2024    Radiographic Findings: No results  found.  Impression/Plan:  Stage IA2 (cT1b, N0, M0) NSCLC, Squamous cell carcinoma of the left upper lung; s/p definitive radiation completed on 11/13/2024   The patient has healed well from the effects of their radiation.***  She will continue follow-up under the care of Dr. Sherrod. She is scheduled to see him next on 02/12/2025. Radiation follow-up PRN. We appreciate the opportunity to take part in this patient's care. She was encouraged to call with any questions or concerns.     I personally spent *** minutes in this encounter including chart review, reviewing radiological studies, meeting face-to-face with the patient, entering orders and completing documentation. ____________________________________    Leeroy Due, PA-C   "

## 2024-12-18 ENCOUNTER — Ambulatory Visit: Admitting: Radiation Oncology

## 2024-12-19 ENCOUNTER — Ambulatory Visit: Admitting: Radiology

## 2024-12-19 NOTE — Progress Notes (Incomplete)
 "  Radiation Oncology         [phone] ________________________________  Name: [name] MRN: [id number]  Date: 12/26/2024  DOB: [birth year]  Follow-Up Visit Note  CC: Patient, No Pcp Per  No ref. provider found  No diagnosis found. ***  Diagnosis:   Stage IA2 (cT1b, N0, M0) NSCLC, Squamous cell carcinoma of the left upper lung; s/p definitive radiation completed on 11/13/2024  Interval Since Last Radiation:  1 month ***  ==========DELIVERED PLANS==========  First Treatment Date: 2024-10-31 Last Treatment Date: 2024-11-13   Plan Name: Lung_L Site: Lung, Left Technique: IMRT Mode: Photon Dose Per Fraction: 5 Gy Prescribed Dose (Delivered / Prescribed): 50 Gy / 50 Gy Prescribed Fxs (Delivered / Prescribed): 10 / 10  Narrative:  The patient returns today for routine follow-up. She completed her treatment approximately 1 month ago       In the interval since she was last seen, she presented to the ED with ear pain and shortness of breath on 12/09/2024. She was ultimately discharged that same day with instructions to follow up with CXR. ***  Today, ***                       ALLERGIES:  is allergic to levofloxacin , penicillins, and doxycycline .  Meds: Current Outpatient Medications  Medication Sig Dispense Refill   albuterol  (PROVENTIL ) (2.5 MG/3ML) 0.083% nebulizer solution Take 3 mLs (2.5 mg total) by nebulization every 6 (six) hours as needed for wheezing or shortness of breath. 75 mL 3   albuterol  (VENTOLIN  HFA) 108 (90 Base) MCG/ACT inhaler Inhale 2 puffs into the lungs every 4 (four) hours as needed for wheezing or shortness of breath. 18 g 1   azithromycin  (ZITHROMAX ) 250 MG tablet Take 2 today and one daily for the next 4 days. 6 tablet 0   glipiZIDE  (GLUCOTROL ) 10 MG tablet Take 20 mg by mouth daily before breakfast.     insulin  aspart (NOVOLOG  FLEXPEN) 100 UNIT/ML FlexPen 3 TIMES A DAY WITH MEALS SUGAR  70-120--no units, 121-150--3 units, 151-200--4 units;  201-250--7 units, 251-300--11 units; 301-350 15 units; 351-400 20 units 15 mL 0   LANTUS  SOLOSTAR 100 UNIT/ML Solostar Pen Inject 30 Units into the skin at bedtime. 15 mL 0   lidocaine  (XYLOCAINE ) 2 % solution Use as directed 15 mLs in the mouth or throat 3 (three) times daily with meals. Swallow 20 min before meals 100 mL 2   ondansetron  (ZOFRAN -ODT) 4 MG disintegrating tablet 4mg  ODT q4 hours prn nausea/vomit (Patient taking differently: Take 4 mg by mouth every 4 (four) hours as needed for nausea or vomiting. 4mg  ODT q4 hours prn nausea/vomit) 10 tablet 0   oxyCODONE -acetaminophen  (PERCOCET/ROXICET) 5-325 MG tablet Take 1 tablet by mouth every 6 (six) hours as needed for severe pain (pain score 7-10). 20 tablet 0   pantoprazole  (PROTONIX ) 40 MG tablet Take 1 tablet (40 mg total) by mouth every other day.     predniSONE  (DELTASONE ) 10 MG tablet Prednisone  taper; 10 mg tablets: 4 tabs x 2 days, 3 tabs x 2 days, 2 tabs x 2 days 1 tab x 2 days then stop. 20 tablet 0   rOPINIRole  (REQUIP ) 2 MG tablet Take 2 mg by mouth 3 (three) times daily.     rosuvastatin  (CRESTOR ) 40 MG tablet Take 40 mg by mouth daily.     SUMAtriptan  (IMITREX ) 50 MG tablet Take 50 mg by mouth as needed for migraine or headache.  SYMBICORT  80-4.5 MCG/ACT inhaler Inhale 2 puffs into the lungs 2 (two) times daily.     No current facility-administered medications for this visit.    Physical Findings:  vitals were not taken for this visit. .   The patient is in no acute distress. Patient is alert and oriented. No significant changes. Lungs are clear to auscultation bilaterally. Heart has regular rate and rhythm. No palpable cervical, supraclavicular, or axillary adenopathy. Abdomen soft, non-tender, normal bowel sounds. ***  Lab Findings: Lab Results  Component Value Date   WBC 9.7 10/16/2024   HGB 12.4 10/16/2024   HCT 41.2 10/16/2024   MCV 90.2 10/16/2024   PLT 239 10/16/2024    Radiographic Findings: No results  found.  Impression/Plan:  Stage IA2 (cT1b, N0, M0) NSCLC, Squamous cell carcinoma of the left upper lung; s/p definitive radiation completed on 11/13/2024   The patient has healed well from the effects of their radiation.***  She will continue follow-up under the care of Dr. Sherrod. She is scheduled to see him next on 02/12/2025. Radiation follow-up PRN. We appreciate the opportunity to take part in this patient's care. She was encouraged to call with any questions or concerns.     I personally spent *** minutes in this encounter including chart review, reviewing radiological studies, meeting face-to-face with the patient, entering orders and completing documentation. ____________________________________    Leeroy Due, PA-C   "

## 2024-12-26 ENCOUNTER — Encounter: Payer: Self-pay | Admitting: Radiology

## 2024-12-26 ENCOUNTER — Ambulatory Visit: Admission: RE | Admit: 2024-12-26 | Source: Ambulatory Visit | Admitting: Radiology

## 2024-12-26 HISTORY — DX: Personal history of irradiation: Z92.3

## 2024-12-26 NOTE — Progress Notes (Signed)
 Patient was rescheduled

## 2025-01-02 ENCOUNTER — Ambulatory Visit: Admitting: Radiology

## 2025-02-05 ENCOUNTER — Inpatient Hospital Stay

## 2025-02-12 ENCOUNTER — Inpatient Hospital Stay: Admitting: Internal Medicine
